# Patient Record
Sex: Male | Born: 1940 | Hispanic: No | Smoking: Former smoker
Health system: Southern US, Community
[De-identification: ages and names within clinical notes are randomized; demographics above are authoritative.]

## PROBLEM LIST (undated history)

## (undated) DIAGNOSIS — C61 Malignant neoplasm of prostate: Secondary | ICD-10-CM

## (undated) DIAGNOSIS — Z8673 Personal history of transient ischemic attack (TIA), and cerebral infarction without residual deficits: Secondary | ICD-10-CM

## (undated) DIAGNOSIS — H409 Unspecified glaucoma: Secondary | ICD-10-CM

## (undated) DIAGNOSIS — I10 Essential (primary) hypertension: Secondary | ICD-10-CM

## (undated) DIAGNOSIS — N183 Chronic kidney disease, stage 3 unspecified: Secondary | ICD-10-CM

## (undated) DIAGNOSIS — E119 Type 2 diabetes mellitus without complications: Secondary | ICD-10-CM

## (undated) DIAGNOSIS — I459 Conduction disorder, unspecified: Secondary | ICD-10-CM

## (undated) HISTORY — DX: Unspecified glaucoma: H40.9

## (undated) HISTORY — DX: Malignant neoplasm of prostate: C61

## (undated) HISTORY — DX: Type 2 diabetes mellitus without complications: E11.9

## (undated) HISTORY — PX: CATARACT EXTRACTION: SUR2

## (undated) HISTORY — PX: INSERTION PROSTATE RADIATION SEED: SUR718

## (undated) HISTORY — DX: Chronic kidney disease, stage 3 unspecified: N18.30

## (undated) HISTORY — DX: Personal history of transient ischemic attack (TIA), and cerebral infarction without residual deficits: Z86.73

---

## 1998-05-19 ENCOUNTER — Other Ambulatory Visit: Admission: RE | Admit: 1998-05-19 | Discharge: 1998-05-19 | Payer: Self-pay | Admitting: *Deleted

## 2000-09-16 ENCOUNTER — Ambulatory Visit (HOSPITAL_COMMUNITY): Admission: RE | Admit: 2000-09-16 | Discharge: 2000-09-16 | Payer: Self-pay | Admitting: Gastroenterology

## 2001-02-03 ENCOUNTER — Encounter: Admission: RE | Admit: 2001-02-03 | Discharge: 2001-05-04 | Payer: Self-pay | Admitting: Internal Medicine

## 2009-05-25 ENCOUNTER — Ambulatory Visit: Admission: RE | Admit: 2009-05-25 | Discharge: 2009-08-23 | Payer: Self-pay | Admitting: Radiation Oncology

## 2009-06-14 ENCOUNTER — Ambulatory Visit (HOSPITAL_COMMUNITY): Admission: RE | Admit: 2009-06-14 | Discharge: 2009-06-14 | Payer: Self-pay | Admitting: Urology

## 2009-07-07 ENCOUNTER — Ambulatory Visit (HOSPITAL_BASED_OUTPATIENT_CLINIC_OR_DEPARTMENT_OTHER): Admission: RE | Admit: 2009-07-07 | Discharge: 2009-07-07 | Payer: Self-pay | Admitting: Urology

## 2009-10-04 ENCOUNTER — Emergency Department (HOSPITAL_COMMUNITY): Admission: EM | Admit: 2009-10-04 | Discharge: 2009-10-04 | Payer: Self-pay | Admitting: Family Medicine

## 2010-02-17 ENCOUNTER — Emergency Department (HOSPITAL_COMMUNITY): Admission: EM | Admit: 2010-02-17 | Discharge: 2010-02-17 | Payer: Self-pay | Admitting: Family Medicine

## 2011-03-13 ENCOUNTER — Encounter: Payer: Medicare Other | Attending: Internal Medicine | Admitting: *Deleted

## 2011-03-13 DIAGNOSIS — E119 Type 2 diabetes mellitus without complications: Secondary | ICD-10-CM | POA: Insufficient documentation

## 2011-03-13 DIAGNOSIS — Z713 Dietary counseling and surveillance: Secondary | ICD-10-CM | POA: Insufficient documentation

## 2011-04-05 LAB — CBC
Hemoglobin: 14.4 g/dL (ref 13.0–17.0)
RBC: 4.78 MIL/uL (ref 4.22–5.81)
RDW: 14.5 % (ref 11.5–15.5)

## 2011-04-05 LAB — DIFFERENTIAL
Basophils Relative: 1 % (ref 0–1)
Lymphs Abs: 0.9 10*3/uL (ref 0.7–4.0)
Monocytes Absolute: 0.9 10*3/uL (ref 0.1–1.0)
Monocytes Relative: 10 % (ref 3–12)
Neutrophils Relative %: 78 % — ABNORMAL HIGH (ref 43–77)

## 2011-04-05 LAB — POCT I-STAT, CHEM 8
Creatinine, Ser: 1.3 mg/dL (ref 0.4–1.5)
Glucose, Bld: 266 mg/dL — ABNORMAL HIGH (ref 70–99)
HCT: 48 % (ref 39.0–52.0)
TCO2: 26 mmol/L (ref 0–100)

## 2011-04-05 LAB — POCT URINALYSIS DIP (DEVICE)
Ketones, ur: 40 mg/dL — AB
Protein, ur: 100 mg/dL — AB
Specific Gravity, Urine: 1.02 (ref 1.005–1.030)
Urobilinogen, UA: 1 mg/dL (ref 0.0–1.0)
pH: 5.5 (ref 5.0–8.0)

## 2011-04-08 LAB — CBC
Hemoglobin: 14.4 g/dL (ref 13.0–17.0)
MCHC: 33.9 g/dL (ref 30.0–36.0)

## 2011-04-08 LAB — COMPREHENSIVE METABOLIC PANEL
BUN: 14 mg/dL (ref 6–23)
CO2: 26 mEq/L (ref 19–32)
Calcium: 9.6 mg/dL (ref 8.4–10.5)
Chloride: 103 mEq/L (ref 96–112)
Glucose, Bld: 228 mg/dL — ABNORMAL HIGH (ref 70–99)
Potassium: 4.3 mEq/L (ref 3.5–5.1)
Total Protein: 6.4 g/dL (ref 6.0–8.3)

## 2011-04-08 LAB — APTT: aPTT: 28 seconds (ref 24–37)

## 2011-04-08 LAB — GLUCOSE, CAPILLARY: Glucose-Capillary: 147 mg/dL — ABNORMAL HIGH (ref 70–99)

## 2011-05-15 NOTE — Op Note (Signed)
NAME:  Andre Stout, Andre Stout NO.:  [id number]   MEDICAL RECORD NO.:  [id number]          PATIENT TYPE:  AMB   LOCATION:  [facility]                         FACILITY:  [facility]   PHYSICIAN:  Lindaann Slough, M.D.  DATE OF BIRTH:  [birth year]   DATE OF PROCEDURE:  07/07/2009  DATE OF DISCHARGE:                               OPERATIVE REPORT   PREOPERATIVE DIAGNOSIS:  Adenocarcinoma of prostate stage T1C.   POSTOPERATIVE DIAGNOSIS:  Adenocarcinoma of prostate stage T1C.   PROCEDURE:  I-125 seeds implantation, cystoscopy.   SURGEON:  Danae Chen, M.D. and Maryln Gottron, M.D.   ASSISTANT:  Dr. Peggye Pitt.   ANESTHESIA:  General.   INDICATION:  The patient is a 71 year old male with a diagnosis of  prostate cancer, Gleason score 4+3 and 3+3 and a PSA of 4.65.  Treatment  options were discussed with the patient and he opted to have seeds  implantation.  He is scheduled today for the procedure.   PROCEDURE IN DETAIL:  The patient was identified by his wrist band and  proper time-out was taken.   The patient was then placed in the dorsal lithotomy position.  Ultrasound planning was done by Dr. Dayton Scrape.  When the planning was  completed under ultrasound guidance and with the Nucletron a total of 78  seeds were implanted in the prostate through 27 needles.  Fluoroscopy  was done and there appears to be good seed distribution.  Then the Foley  catheter was removed.  A flexible cystoscope was inserted in the  bladder.  The urethra is normal.  There is moderate prostatic  hypertrophy.  The bladder mucosa is normal.  The ureteral orifices are  in normal position and shape with clear efflux.  There is no stone, seed  or tumor in the bladder.  The cystoscope was then removed.  A #16 Foley  catheter was then inserted in the bladder.  The patient tolerated the  procedure well and left the OR in satisfactory condition to post  anesthesia care unit.      Lindaann Slough, M.D.  Electronically Signed     MN/MEDQ  D:  07/07/2009  T:  07/07/2009  Job:  [id number]

## 2011-05-18 NOTE — Procedures (Signed)
[facility]  Patient:    Andre Stout, Andre Stout                       MRN: [id number] Proc. Date: 09/16/00 Adm. Date:  [id number] Attending:  Louie Bun                           Procedure Report  PROCEDURE:  Colonoscopy.  INDICATION FOR PROCEDURE:  History of adenomatous colon polyps on colonoscopy 3 years ago.  DESCRIPTION OF PROCEDURE:  The patient was placed in the left lateral decubitus position and placed on the pulse monitor with continuous low flow oxygen delivered by nasal cannula. He was sedated with 50 mg IV Demerol and 5 mg IV Versed. The Olympus video colonoscope was inserted into the rectum and advanced to the cecum, confirmed by transillumination of McBurneys point and visualization of the ileocecal valve and appendiceal orifice. The prep was excellent. The cecum, ascending, transverse, descending and sigmoid colon all appeared normal with no masses, polyps, diverticula or other mucosal abnormalities. The rectum likewise appeared normal and retroflexed view of the anus did reveal some small internal hemorrhoids. The colonoscope was then withdrawn and the patient returned to the recovery room in stable condition. The patient tolerated the procedure well and there were no immediate complications.  IMPRESSION:  Internal hemorrhoids otherwise normal colonoscopy.  PLAN:  Follow-up colonoscopy in 5 years. DD:  09/16/00 TD:  09/17/00 Job: [id number]

## 2011-06-18 ENCOUNTER — Inpatient Hospital Stay (INDEPENDENT_AMBULATORY_CARE_PROVIDER_SITE_OTHER)
Admission: RE | Admit: 2011-06-18 | Discharge: 2011-06-18 | Disposition: A | Discharge status: Home / Self Care | Payer: PRIVATE HEALTH INSURANCE | Source: Ambulatory Visit | Attending: Emergency Medicine | Admitting: Emergency Medicine

## 2011-06-18 ENCOUNTER — Ambulatory Visit (INDEPENDENT_AMBULATORY_CARE_PROVIDER_SITE_OTHER): Payer: PRIVATE HEALTH INSURANCE

## 2011-06-18 DIAGNOSIS — M79609 Pain in unspecified limb: Secondary | ICD-10-CM

## 2011-07-31 ENCOUNTER — Ambulatory Visit (INDEPENDENT_AMBULATORY_CARE_PROVIDER_SITE_OTHER): Payer: PRIVATE HEALTH INSURANCE | Admitting: Ophthalmology

## 2011-07-31 DIAGNOSIS — E11359 Type 2 diabetes mellitus with proliferative diabetic retinopathy without macular edema: Secondary | ICD-10-CM

## 2011-07-31 DIAGNOSIS — H2510 Age-related nuclear cataract, unspecified eye: Secondary | ICD-10-CM

## 2011-07-31 DIAGNOSIS — H43819 Vitreous degeneration, unspecified eye: Secondary | ICD-10-CM

## 2012-01-31 ENCOUNTER — Ambulatory Visit (INDEPENDENT_AMBULATORY_CARE_PROVIDER_SITE_OTHER): Payer: Medicare Other | Admitting: Ophthalmology

## 2012-01-31 DIAGNOSIS — E1139 Type 2 diabetes mellitus with other diabetic ophthalmic complication: Secondary | ICD-10-CM

## 2012-01-31 DIAGNOSIS — H3581 Retinal edema: Secondary | ICD-10-CM

## 2012-01-31 DIAGNOSIS — H25019 Cortical age-related cataract, unspecified eye: Secondary | ICD-10-CM

## 2012-01-31 DIAGNOSIS — H43819 Vitreous degeneration, unspecified eye: Secondary | ICD-10-CM

## 2012-01-31 DIAGNOSIS — E1165 Type 2 diabetes mellitus with hyperglycemia: Secondary | ICD-10-CM

## 2012-01-31 DIAGNOSIS — E11359 Type 2 diabetes mellitus with proliferative diabetic retinopathy without macular edema: Secondary | ICD-10-CM

## 2012-02-13 ENCOUNTER — Ambulatory Visit (INDEPENDENT_AMBULATORY_CARE_PROVIDER_SITE_OTHER): Payer: Medicare Other | Admitting: Ophthalmology

## 2012-02-13 DIAGNOSIS — H3581 Retinal edema: Secondary | ICD-10-CM

## 2012-06-16 ENCOUNTER — Ambulatory Visit (INDEPENDENT_AMBULATORY_CARE_PROVIDER_SITE_OTHER): Payer: Medicare Other | Admitting: Ophthalmology

## 2012-06-16 DIAGNOSIS — H3340 Traction detachment of retina, unspecified eye: Secondary | ICD-10-CM

## 2012-06-16 DIAGNOSIS — E11359 Type 2 diabetes mellitus with proliferative diabetic retinopathy without macular edema: Secondary | ICD-10-CM

## 2012-06-16 DIAGNOSIS — H43819 Vitreous degeneration, unspecified eye: Secondary | ICD-10-CM

## 2012-06-16 DIAGNOSIS — E1139 Type 2 diabetes mellitus with other diabetic ophthalmic complication: Secondary | ICD-10-CM

## 2012-06-16 DIAGNOSIS — H2510 Age-related nuclear cataract, unspecified eye: Secondary | ICD-10-CM

## 2012-10-20 ENCOUNTER — Ambulatory Visit (INDEPENDENT_AMBULATORY_CARE_PROVIDER_SITE_OTHER): Payer: Medicare Other | Admitting: Ophthalmology

## 2012-10-20 DIAGNOSIS — H43819 Vitreous degeneration, unspecified eye: Secondary | ICD-10-CM

## 2012-10-20 DIAGNOSIS — E1139 Type 2 diabetes mellitus with other diabetic ophthalmic complication: Secondary | ICD-10-CM

## 2012-10-20 DIAGNOSIS — E1165 Type 2 diabetes mellitus with hyperglycemia: Secondary | ICD-10-CM

## 2012-10-20 DIAGNOSIS — H3340 Traction detachment of retina, unspecified eye: Secondary | ICD-10-CM

## 2012-10-20 DIAGNOSIS — E11359 Type 2 diabetes mellitus with proliferative diabetic retinopathy without macular edema: Secondary | ICD-10-CM

## 2013-02-20 ENCOUNTER — Ambulatory Visit (INDEPENDENT_AMBULATORY_CARE_PROVIDER_SITE_OTHER): Payer: Medicare Other | Admitting: Ophthalmology

## 2013-02-20 DIAGNOSIS — H43819 Vitreous degeneration, unspecified eye: Secondary | ICD-10-CM

## 2013-02-20 DIAGNOSIS — H2510 Age-related nuclear cataract, unspecified eye: Secondary | ICD-10-CM

## 2013-02-20 DIAGNOSIS — E11359 Type 2 diabetes mellitus with proliferative diabetic retinopathy without macular edema: Secondary | ICD-10-CM

## 2013-02-20 DIAGNOSIS — H3340 Traction detachment of retina, unspecified eye: Secondary | ICD-10-CM

## 2013-02-20 DIAGNOSIS — E1039 Type 1 diabetes mellitus with other diabetic ophthalmic complication: Secondary | ICD-10-CM

## 2013-08-06 ENCOUNTER — Ambulatory Visit (INDEPENDENT_AMBULATORY_CARE_PROVIDER_SITE_OTHER): Payer: Medicare Other | Admitting: Ophthalmology

## 2013-08-06 DIAGNOSIS — H35039 Hypertensive retinopathy, unspecified eye: Secondary | ICD-10-CM

## 2013-08-06 DIAGNOSIS — H3340 Traction detachment of retina, unspecified eye: Secondary | ICD-10-CM

## 2013-08-06 DIAGNOSIS — H43819 Vitreous degeneration, unspecified eye: Secondary | ICD-10-CM

## 2013-08-06 DIAGNOSIS — E1165 Type 2 diabetes mellitus with hyperglycemia: Secondary | ICD-10-CM

## 2013-08-06 DIAGNOSIS — E1139 Type 2 diabetes mellitus with other diabetic ophthalmic complication: Secondary | ICD-10-CM

## 2013-08-06 DIAGNOSIS — E11359 Type 2 diabetes mellitus with proliferative diabetic retinopathy without macular edema: Secondary | ICD-10-CM

## 2013-08-06 DIAGNOSIS — I10 Essential (primary) hypertension: Secondary | ICD-10-CM

## 2013-08-21 ENCOUNTER — Ambulatory Visit (INDEPENDENT_AMBULATORY_CARE_PROVIDER_SITE_OTHER): Payer: Medicare Other | Admitting: Ophthalmology

## 2014-02-08 ENCOUNTER — Ambulatory Visit (INDEPENDENT_AMBULATORY_CARE_PROVIDER_SITE_OTHER): Payer: Medicare Other | Admitting: Ophthalmology

## 2014-02-08 DIAGNOSIS — H4310 Vitreous hemorrhage, unspecified eye: Secondary | ICD-10-CM

## 2014-02-08 DIAGNOSIS — E1139 Type 2 diabetes mellitus with other diabetic ophthalmic complication: Secondary | ICD-10-CM

## 2014-02-08 DIAGNOSIS — E11359 Type 2 diabetes mellitus with proliferative diabetic retinopathy without macular edema: Secondary | ICD-10-CM

## 2014-02-08 DIAGNOSIS — H35039 Hypertensive retinopathy, unspecified eye: Secondary | ICD-10-CM

## 2014-02-08 DIAGNOSIS — I10 Essential (primary) hypertension: Secondary | ICD-10-CM

## 2014-06-09 ENCOUNTER — Ambulatory Visit (INDEPENDENT_AMBULATORY_CARE_PROVIDER_SITE_OTHER): Payer: Medicare Other | Admitting: Ophthalmology

## 2014-06-09 DIAGNOSIS — E11359 Type 2 diabetes mellitus with proliferative diabetic retinopathy without macular edema: Secondary | ICD-10-CM

## 2014-06-09 DIAGNOSIS — H2510 Age-related nuclear cataract, unspecified eye: Secondary | ICD-10-CM

## 2014-06-09 DIAGNOSIS — H35039 Hypertensive retinopathy, unspecified eye: Secondary | ICD-10-CM

## 2014-06-09 DIAGNOSIS — E1165 Type 2 diabetes mellitus with hyperglycemia: Secondary | ICD-10-CM

## 2014-06-09 DIAGNOSIS — H43819 Vitreous degeneration, unspecified eye: Secondary | ICD-10-CM

## 2014-06-09 DIAGNOSIS — I10 Essential (primary) hypertension: Secondary | ICD-10-CM

## 2014-06-09 DIAGNOSIS — E1139 Type 2 diabetes mellitus with other diabetic ophthalmic complication: Secondary | ICD-10-CM

## 2014-10-29 ENCOUNTER — Ambulatory Visit (INDEPENDENT_AMBULATORY_CARE_PROVIDER_SITE_OTHER): Payer: Medicare Other | Admitting: Family

## 2014-10-29 ENCOUNTER — Encounter: Payer: Self-pay | Admitting: Family

## 2014-10-29 ENCOUNTER — Other Ambulatory Visit (INDEPENDENT_AMBULATORY_CARE_PROVIDER_SITE_OTHER): Payer: Medicare Other

## 2014-10-29 VITALS — BP 132/74 | HR 73 | Temp 98.5°F | Resp 18 | Ht 64.0 in | Wt 169.2 lb

## 2014-10-29 DIAGNOSIS — E119 Type 2 diabetes mellitus without complications: Secondary | ICD-10-CM

## 2014-10-29 LAB — BASIC METABOLIC PANEL
BUN: 11 mg/dL (ref 6–23)
CO2: 20 mEq/L (ref 19–32)
CREATININE: 1 mg/dL (ref 0.4–1.5)
Calcium: 9.8 mg/dL (ref 8.4–10.5)
Chloride: 105 mEq/L (ref 96–112)
GFR: 76.76 mL/min (ref >60.00)
GLUCOSE: 61 mg/dL — AB (ref 70–99)
Potassium: 4.5 mEq/L (ref 3.5–5.1)
SODIUM: 136 meq/L (ref 135–145)

## 2014-10-29 LAB — HEMOGLOBIN A1C: HEMOGLOBIN A1C: 9.2 % — AB (ref 4.6–6.5)

## 2014-10-29 NOTE — Progress Notes (Signed)
Pre visit review using our clinic review tool, if applicable. No additional management support is needed unless otherwise documented below in the visit note. 

## 2014-10-29 NOTE — Assessment & Plan Note (Signed)
Diabetes remains uncontrolled with an A1c >7. Obtain A1c for control status and BMET for kidney function. Continue 30 U lantus daily, glyburide/metformin, Januvia, and Pioglitizone. Follow up in 3 months or sooner pending lab results.   LAB RESULTS: A1c 9.2 and kidney function normal. Concern for blood sugar of 61 at time of test. Will titrate Lantus pending patient's morning readings.

## 2014-10-29 NOTE — Progress Notes (Signed)
   Subjective:    Patient ID: Andre Stout, male    DOB: [birth year], 74 y.o.   MRN: [id number]  No chief complaint on file.  HPI:  Andre Stout is a 74 y.o. male who presents today for to establish care.   1) Diabetes - currently maintained on 30 units of lantus, Glyburide/Metformin, Januvia, and Pioglitazone. Last diabetic eye exam was completed about 3-4 months ago. Last diabetic foot exam was about 4 months ago. Last A1c was around 8 per patient.   No Known Allergies  Meds as listed above.   Past Medical History  Diagnosis Date  . Diabetes mellitus without complication   . Prostate cancer     Been 3-4 years ago   History   Social History  . Marital Status: Widowed    Spouse Name: N/A    Number of Children: 1  . Years of Education: 12   Occupational History  . Retired    Social History Main Topics  . Smoking status: Former Smoker -- 0.50 packs/day for 4 years    Types: Cigarettes    Quit date: 10/29/1974  . Smokeless tobacco: Never Used  . Alcohol Use: No  . Drug Use: No  . Sexual Activity: Not Currently   Other Topics Concern  . Not on file   Social History Narrative   Born and raised in [place], [place]. Currently reside in a private residence by himself. Daughter lives in the area. No live. Fun: hunt and fish   Denies religious beliefs that would effect health care.      Review of Systems    See HPI   Objective:    BP 132/74  Pulse 73  Temp(Src) 98.5 F (36.9 C) (Oral)  Resp 18  Ht 5\' 4"  (1.626 m)  Wt 169 lb 3.2 oz (76.749 kg)  BMI 29.03 kg/m2  SpO2 97%  Nursing note and vital signs reviewed.  Physical Exam  Constitutional: He is oriented to person, place, and time. He appears well-developed and well-nourished.  Cardiovascular: Normal rate, regular rhythm and normal heart sounds.   Pulmonary/Chest: Effort normal and breath sounds normal.  Neurological: He is alert and oriented to person, place, and time.  Skin: Skin is warm and dry.    Psychiatric: He has a normal mood and affect. His behavior is normal. Judgment and thought content normal.       Assessment & Plan:

## 2014-10-29 NOTE — Patient Instructions (Signed)
Thank you for choosing [facility].  Summary/Instructions:   Please stop by the lab prior to leaving for your blood work.  We will have the results of your labs to you when we receive them.  It was a pleasure to meet you.  We are here for your medical needs

## 2014-11-01 NOTE — Addendum Note (Signed)
Addended by: Delice Bison E on: 11/01/2014 09:46 AM   Modules accepted: Medications

## 2014-11-02 ENCOUNTER — Telehealth: Payer: Self-pay | Admitting: Family

## 2014-11-02 NOTE — Telephone Encounter (Signed)
Please call patient to inform him that his A1c is 9.2. This is higher than the recommended goal and therefore we may possible need to make adjustments to his current regimen. With his permission I would like to keep the insulin levels the same and add a medication call Tradjenta which will be a pill he takes on a daily basis. With this pill we may be able to decrease one of the other medications if we achieve control.

## 2014-11-03 NOTE — Telephone Encounter (Signed)
Called pt for lab results. No answer left message for him to call back.

## 2014-11-04 NOTE — Telephone Encounter (Signed)
Sent lab results in the mail.

## 2014-11-05 ENCOUNTER — Ambulatory Visit: Payer: Medicare Other | Admitting: Internal Medicine

## 2014-12-09 ENCOUNTER — Ambulatory Visit (INDEPENDENT_AMBULATORY_CARE_PROVIDER_SITE_OTHER): Payer: Medicare Other | Admitting: Ophthalmology

## 2014-12-09 DIAGNOSIS — E11359 Type 2 diabetes mellitus with proliferative diabetic retinopathy without macular edema: Secondary | ICD-10-CM | POA: Diagnosis not present

## 2014-12-09 DIAGNOSIS — E11311 Type 2 diabetes mellitus with unspecified diabetic retinopathy with macular edema: Secondary | ICD-10-CM

## 2014-12-09 DIAGNOSIS — E11351 Type 2 diabetes mellitus with proliferative diabetic retinopathy with macular edema: Secondary | ICD-10-CM | POA: Diagnosis not present

## 2014-12-09 DIAGNOSIS — H35033 Hypertensive retinopathy, bilateral: Secondary | ICD-10-CM

## 2014-12-09 DIAGNOSIS — H43813 Vitreous degeneration, bilateral: Secondary | ICD-10-CM

## 2014-12-09 DIAGNOSIS — I10 Essential (primary) hypertension: Secondary | ICD-10-CM | POA: Diagnosis not present

## 2014-12-16 ENCOUNTER — Encounter (INDEPENDENT_AMBULATORY_CARE_PROVIDER_SITE_OTHER): Payer: Medicare Other | Admitting: Ophthalmology

## 2014-12-16 DIAGNOSIS — E11311 Type 2 diabetes mellitus with unspecified diabetic retinopathy with macular edema: Secondary | ICD-10-CM

## 2014-12-16 DIAGNOSIS — E11351 Type 2 diabetes mellitus with proliferative diabetic retinopathy with macular edema: Secondary | ICD-10-CM

## 2015-01-13 ENCOUNTER — Encounter (INDEPENDENT_AMBULATORY_CARE_PROVIDER_SITE_OTHER): Payer: Medicare Other | Admitting: Ophthalmology

## 2015-01-13 DIAGNOSIS — I10 Essential (primary) hypertension: Secondary | ICD-10-CM

## 2015-01-13 DIAGNOSIS — E11359 Type 2 diabetes mellitus with proliferative diabetic retinopathy without macular edema: Secondary | ICD-10-CM

## 2015-01-13 DIAGNOSIS — E11311 Type 2 diabetes mellitus with unspecified diabetic retinopathy with macular edema: Secondary | ICD-10-CM | POA: Diagnosis not present

## 2015-01-13 DIAGNOSIS — H35033 Hypertensive retinopathy, bilateral: Secondary | ICD-10-CM

## 2015-01-13 DIAGNOSIS — E11351 Type 2 diabetes mellitus with proliferative diabetic retinopathy with macular edema: Secondary | ICD-10-CM

## 2015-01-13 DIAGNOSIS — H43813 Vitreous degeneration, bilateral: Secondary | ICD-10-CM

## 2015-01-31 ENCOUNTER — Telehealth: Payer: Self-pay | Admitting: Family

## 2015-01-31 ENCOUNTER — Other Ambulatory Visit (INDEPENDENT_AMBULATORY_CARE_PROVIDER_SITE_OTHER): Payer: Medicare Other

## 2015-01-31 ENCOUNTER — Encounter: Payer: Self-pay | Admitting: Family

## 2015-01-31 ENCOUNTER — Ambulatory Visit (INDEPENDENT_AMBULATORY_CARE_PROVIDER_SITE_OTHER): Payer: Medicare Other | Admitting: Family

## 2015-01-31 VITALS — BP 142/76 | HR 64 | Temp 97.6°F | Resp 18 | Ht 64.0 in | Wt 161.1 lb

## 2015-01-31 DIAGNOSIS — E119 Type 2 diabetes mellitus without complications: Secondary | ICD-10-CM

## 2015-01-31 DIAGNOSIS — IMO0002 Reserved for concepts with insufficient information to code with codable children: Secondary | ICD-10-CM

## 2015-01-31 DIAGNOSIS — E1165 Type 2 diabetes mellitus with hyperglycemia: Secondary | ICD-10-CM

## 2015-01-31 LAB — HEMOGLOBIN A1C: HEMOGLOBIN A1C: 11.3 % — AB (ref 4.6–6.5)

## 2015-01-31 MED ORDER — GLYBURIDE-METFORMIN 5-500 MG PO TABS: 1.0000 | ORAL_TABLET | Freq: Two times a day (BID) | ORAL | Status: DC

## 2015-01-31 MED ORDER — PIOGLITAZONE HCL 30 MG PO TABS: 30.0000 mg | ORAL_TABLET | Freq: Every day | ORAL | Status: DC

## 2015-01-31 MED ORDER — INSULIN GLARGINE 100 UNIT/ML ~~LOC~~ SOLN: 35.0000 [IU] | Freq: Every morning | SUBCUTANEOUS | Status: DC

## 2015-01-31 MED ORDER — ENALAPRIL MALEATE 5 MG PO TABS: 5.0000 mg | ORAL_TABLET | Freq: Two times a day (BID) | ORAL | Status: DC

## 2015-01-31 NOTE — Progress Notes (Signed)
   Subjective:    Patient ID: Andre Stout, male    DOB: 01/18/1940, 76 y.o.   MRN: [id number]  Chief Complaint  Patient presents with  . Follow-up    sometimes his sugar runs high around 200s but mainly stays in 130s and 140s    HPI:  Andre Stout is a 75 y.o. male who presents today for follow up of his diabetes.  Currently treated with glyburide-metformin, Lantus, and Actos. He is scheduled for an eye exam on February 11. Indicates that his blood sugars have ranged in the mid to upper 100s. Patient states that he is taking only 30 units of Lantus in the morning and is not taking the 10 units at night. Also notes that he is taking the Glucovance 2 tablets in the morning and 2 in the evening. Is due for foot exam.  Lab Results  Component Value Date   HGBA1C 9.2* 10/29/2014    No Known Allergies   Current Outpatient Prescriptions on File Prior to Visit  Medication Sig Dispense Refill  . Cholecalciferol (VITAMIN D3) 2000 UNITS TABS Take by mouth daily.    Marland Kitchen Cod Liver Oil 1000 MG CAPS Take by mouth daily.    . enalapril (VASOTEC) 5 MG tablet Take 5 mg by mouth 2 (two) times daily.    Marland Kitchen glyBURIDE-metformin (GLUCOVANCE) 5-500 MG per tablet Take 1 tablet by mouth 2 (two) times daily.    . insulin glargine (LANTUS) 100 UNIT/ML injection Inject 40 Units into the skin at bedtime. 30 units in the morning and 10 units in the evening    . Misc Natural Products (URINOZINC PO) Take by mouth daily.    . Multiple Vitamins-Minerals (CENTRUM ADULTS PO) Take by mouth daily.    . Omega 3 1200 MG CAPS Take by mouth daily.    . pioglitazone (ACTOS) 30 MG tablet Take 30 mg by mouth daily.     No current facility-administered medications on file prior to visit.    Review of Systems  Constitutional: Negative for unexpected weight change.  Eyes:       Denies changes in vision  Respiratory: Positive for chest tightness. Negative for shortness of breath.   Cardiovascular: Negative for chest pain  and leg swelling.  Endocrine: Negative for polydipsia, polyphagia and polyuria.  Neurological: Negative for numbness.      Objective:    BP 142/76 mmHg  Pulse 64  Temp(Src) 97.6 F (36.4 C) (Oral)  Resp 18  Ht 5\' 4"  (1.626 m)  Wt 161 lb 1.9 oz (73.084 kg)  BMI 27.64 kg/m2  SpO2 97% Nursing note and vital signs reviewed.  Physical Exam  Constitutional: He is oriented to person, place, and time. He appears well-developed and well-nourished. No distress.  Cardiovascular: Normal rate, regular rhythm, normal heart sounds and intact distal pulses.   Pulmonary/Chest: Effort normal and breath sounds normal.  Neurological: He is alert and oriented to person, place, and time.  Bilateral foot exam: Feet are free from skin breakdown and abrasions. Pulses are intact and appropriate. Sensation is intact and appropriate to monofilament.  Skin: Skin is warm and dry.  Psychiatric: He has a normal mood and affect. His behavior is normal. Judgment and thought content normal.       Assessment & Plan:

## 2015-01-31 NOTE — Assessment & Plan Note (Addendum)
Remains uncontrolled with a A1c of 9.2. Appear to be confusion regarding his medication regimen. Discussed his medications in detail. 6 increase Lantus to 35 units daily. Decrease Glucovance to one tablet twice a day. Continue taking Actos as previously prescribed at current dosage. Diabetic foot exam is normal. Patient unable to provide urine sample today for urine microalbumin. Obtain A1c. Follow up in 3 months.

## 2015-01-31 NOTE — Patient Instructions (Addendum)
Please take your Lantus at 35 Units in the morning.  Take the Glucovance 1 tablet in the morning and 1 tablet in the evening.  Continue to take your Actos as you always have.   Thank you for choosing [facility].  Summary/Instructions:  Your prescription(s) have been submitted to your pharmacy or been printed and provided for you. Please take as directed and contact our office if you believe you are having problem(s) with the medication(s) or have any questions.  Please stop by the lab on the basement level of the building for your blood work. Your results will be released to [facility] (or called to you) after review, usually within 72 hours after test completion. If any changes need to be made, you will be notified at that same time.  If your symptoms worsen or fail to improve, please contact our office for further instruction, or in case of emergency go directly to the emergency room at the closest medical facility.

## 2015-01-31 NOTE — Telephone Encounter (Signed)
Please inform the patient that his A1c has increased from 9.2 to 11.3. Therefore please increase the Lantus to 40 Units in the morning instead of the 35 we discussed and continue taking the other medications as prescribed. I am also sending a referral for endocrinology to assist in his care.

## 2015-01-31 NOTE — Progress Notes (Signed)
Pre visit review using our clinic review tool, if applicable. No additional management support is needed unless otherwise documented below in the visit note. 

## 2015-02-02 NOTE — Telephone Encounter (Signed)
Pt aware of results. Will do what is told in the note below.

## 2015-02-10 ENCOUNTER — Encounter (INDEPENDENT_AMBULATORY_CARE_PROVIDER_SITE_OTHER): Payer: Medicare Other | Admitting: Ophthalmology

## 2015-02-10 DIAGNOSIS — E11351 Type 2 diabetes mellitus with proliferative diabetic retinopathy with macular edema: Secondary | ICD-10-CM

## 2015-02-10 DIAGNOSIS — E11311 Type 2 diabetes mellitus with unspecified diabetic retinopathy with macular edema: Secondary | ICD-10-CM | POA: Diagnosis not present

## 2015-02-10 DIAGNOSIS — E11359 Type 2 diabetes mellitus with proliferative diabetic retinopathy without macular edema: Secondary | ICD-10-CM | POA: Diagnosis not present

## 2015-02-10 DIAGNOSIS — I10 Essential (primary) hypertension: Secondary | ICD-10-CM | POA: Diagnosis not present

## 2015-02-10 DIAGNOSIS — H43813 Vitreous degeneration, bilateral: Secondary | ICD-10-CM | POA: Diagnosis not present

## 2015-02-10 DIAGNOSIS — H35033 Hypertensive retinopathy, bilateral: Secondary | ICD-10-CM | POA: Diagnosis not present

## 2015-02-22 ENCOUNTER — Ambulatory Visit (INDEPENDENT_AMBULATORY_CARE_PROVIDER_SITE_OTHER): Payer: Medicare Other | Admitting: Endocrinology

## 2015-02-22 ENCOUNTER — Encounter: Payer: Self-pay | Admitting: Endocrinology

## 2015-02-22 VITALS — BP 138/78 | HR 68 | Temp 98.4°F | Ht 64.0 in | Wt 163.0 lb

## 2015-02-22 DIAGNOSIS — E119 Type 2 diabetes mellitus without complications: Secondary | ICD-10-CM

## 2015-02-22 MED ORDER — INSULIN GLARGINE 100 UNIT/ML ~~LOC~~ SOLN: 50.0000 [IU] | Freq: Every morning | SUBCUTANEOUS | Status: DC

## 2015-02-22 NOTE — Patient Instructions (Addendum)
good diet and exercise habits significanly improve the control of your diabetes.  please let me know if you wish to be referred to a dietician.  high blood sugar is very risky to your health.  you should see an eye doctor and dentist every year.  It is very important to get all recommended vaccinations.  controlling your blood pressure and cholesterol drastically reduces the damage diabetes does to your body.  Those who smoke should quit.  please discuss these with your doctor.  check your blood sugar twice a day.  vary the time of day when you check, between before the 3 meals, and at bedtime.  also check if you have symptoms of your blood sugar being too high or too low.  please keep a record of the readings and bring it to your next appointment here.  You can write it on any piece of paper.  please call us sooner if your blood sugar goes below 70, or if you have a lot of readings over 200.   For now, please stop taking the glyburide-metformin and pioglitizone, and: Increase the lantus to 50 units each morning. Please come back for a follow-up appointment in 2 weeks.

## 2015-02-22 NOTE — Progress Notes (Signed)
Subjective:    Patient ID: Andre Stout, male    DOB: May 20, 1940, 75 y.o.   MRN: [id number]  HPI pt states DM was dx'ed in 1990; he has mild if any neuropathy of the lower extremities; but he has associated retinopathy; he has been on insulin since 2006; pt says his diet and exercise are "fair;" he has never had pancreatitis, severe hypoglycemia or DKA.  He says cbg's in am are in the mid-100's. Pt says he never misses the insulin.   Past Medical History  Diagnosis Date  . Diabetes mellitus without complication   . Prostate cancer     Been 3-4 years ago    Past Surgical History  Procedure Laterality Date  . Cataract extraction Bilateral     History   Social History  . Marital Status: Widowed    Spouse Name: N/A  . Number of Children: 1  . Years of Education: 12   Occupational History  . Retired    Social History Main Topics  . Smoking status: Former Smoker -- 0.50 packs/day for 4 years    Types: Cigarettes    Quit date: 10/29/1974  . Smokeless tobacco: Never Used  . Alcohol Use: No  . Drug Use: No  . Sexual Activity: Not Currently   Other Topics Concern  . Not on file   Social History Narrative   Born and raised in [place], [place]. Currently reside in a private residence by himself. Daughter lives in the area. No live. Fun: hunt and fish   Denies religious beliefs that would effect health care.     Current Outpatient Prescriptions on File Prior to Visit  Medication Sig Dispense Refill  . Cholecalciferol (VITAMIN D3) 2000 UNITS TABS Take by mouth daily.    Marland Kitchen Cod Liver Oil 1000 MG CAPS Take by mouth daily.    . enalapril (VASOTEC) 5 MG tablet Take 1 tablet (5 mg total) by mouth 2 (two) times daily. 180 tablet 3  . Misc Natural Products (URINOZINC PO) Take by mouth daily.    . Multiple Vitamins-Minerals (CENTRUM ADULTS PO) Take by mouth daily.    . Omega 3 1200 MG CAPS Take by mouth daily.     No current facility-administered medications on file prior to visit.      No Known Allergies  Family History  Problem Relation Age of Onset  . Diabetes Father   . Diabetes Paternal Grandfather     BP 138/78 mmHg  Pulse 68  Temp(Src) 98.4 F (36.9 C) (Oral)  Ht 5\' 4"  (1.626 m)  Wt 163 lb (73.936 kg)  BMI 27.97 kg/m2  SpO2 98%  Review of Systems denies blurry vision, headache, chest pain, sob, n/v, hypoglycemia, urinary frequency, muscle cramps, excessive diaphoresis, depression, cold intolerance, rhinorrhea, and easy bruising.  He has lost a few lbs.      Objective:   Physical Exam VITAL SIGNS:  See vs page GENERAL: no distress Pulses: dorsalis pedis intact bilat.   MSK: no deformity of the feet CV: 1+ bilat leg edema Skin:  no ulcer on the feet.  normal color and temp on the feet. Neuro: sensation is intact to touch on the feet.    Lab Results  Component Value Date   HGBA1C 11.3* 01/31/2015       Assessment & Plan:  DM: new to me: severe exacerbation   Patient is advised the following: Patient Instructions  good diet and exercise habits significanly improve the control of your diabetes.  please  let me know if you wish to be referred to a dietician.  high blood sugar is very risky to your health.  you should see an eye doctor and dentist every year.  It is very important to get all recommended vaccinations.  controlling your blood pressure and cholesterol drastically reduces the damage diabetes does to your body.  Those who smoke should quit.  please discuss these with your doctor.  check your blood sugar twice a day.  vary the time of day when you check, between before the 3 meals, and at bedtime.  also check if you have symptoms of your blood sugar being too high or too low.  please keep a record of the readings and bring it to your next appointment here.  You can write it on any piece of paper.  please call us sooner if your blood sugar goes below 70, or if you have a lot of readings over 200.   For now, please stop taking the  glyburide-metformin and pioglitizone, and: Increase the lantus to 50 units each morning. Please come back for a follow-up appointment in 2 weeks.

## 2015-03-08 ENCOUNTER — Ambulatory Visit (INDEPENDENT_AMBULATORY_CARE_PROVIDER_SITE_OTHER): Payer: Medicare Other | Admitting: Endocrinology

## 2015-03-08 ENCOUNTER — Encounter: Payer: Self-pay | Admitting: Endocrinology

## 2015-03-08 VITALS — BP 132/66 | HR 63 | Temp 98.6°F | Wt 162.2 lb

## 2015-03-08 DIAGNOSIS — E119 Type 2 diabetes mellitus without complications: Secondary | ICD-10-CM

## 2015-03-08 NOTE — Progress Notes (Signed)
Subjective:    Patient ID: Andre Stout, male    DOB: 1940/07/20, 75 y.o.   MRN: [id number]  HPI  Pt returns for f/u of diabetes mellitus: DM type: Insulin-requiring type 2 Dx'ed: 8299 Complications: retinopathy Therapy: insulin since 2006 DKA: never Severe hypoglycemia: never Pancreatitis: never Other: he has chosen a qd insulin regimen.   Interval history: no cbg record, but states cbg's vary from 80-200.  It is in general higher as the day goes on.  pt states he feels well in general. Past Medical History  Diagnosis Date  . Diabetes mellitus without complication   . Prostate cancer     Been 3-4 years ago    Past Surgical History  Procedure Laterality Date  . Cataract extraction Bilateral     History   Social History  . Marital Status: Widowed    Spouse Name: N/A  . Number of Children: 1  . Years of Education: 12   Occupational History  . Retired    Social History Main Topics  . Smoking status: Former Smoker -- 0.50 packs/day for 4 years    Types: Cigarettes    Quit date: 10/29/1974  . Smokeless tobacco: Never Used  . Alcohol Use: No  . Drug Use: No  . Sexual Activity: Not Currently   Other Topics Concern  . Not on file   Social History Narrative   Born and raised in [place], [place]. Currently reside in a private residence by himself. Daughter lives in the area. No live. Fun: hunt and fish   Denies religious beliefs that would effect health care.     Current Outpatient Prescriptions on File Prior to Visit  Medication Sig Dispense Refill  . Cholecalciferol (VITAMIN D3) 2000 UNITS TABS Take by mouth daily.    Marland Kitchen Cod Liver Oil 1000 MG CAPS Take by mouth daily.    . enalapril (VASOTEC) 5 MG tablet Take 1 tablet (5 mg total) by mouth 2 (two) times daily. 180 tablet 3  . insulin glargine (LANTUS) 100 UNIT/ML injection Inject 0.5 mLs (50 Units total) into the skin every morning. 20 mL 11  . Misc Natural Products (URINOZINC PO) Take by mouth daily.    .  Multiple Vitamins-Minerals (CENTRUM ADULTS PO) Take by mouth daily.    . Omega 3 1200 MG CAPS Take by mouth daily.     No current facility-administered medications on file prior to visit.    No Known Allergies  Family History  Problem Relation Age of Onset  . Diabetes Father   . Diabetes Paternal Grandfather     BP 132/66 mmHg  Pulse 63  Temp(Src) 98.6 F (37 C) (Oral)  Wt 162 lb 3.2 oz (73.573 kg)    Review of Systems He denies hypoglycemia.      Objective:   Physical Exam VITAL SIGNS:  See vs page.   GENERAL: no distress. SKIN:  Insulin injection sites at the anterior abdomen are normal, except for a few ecchymoses      Assessment & Plan:  DM: The pattern of his cbg's indicates he needs a faster-acting insulin.  Pt declines to change to levemir for now, but will reconsider at next ov.   Patient is advised the following: Patient Instructions  check your blood sugar twice a day.  vary the time of day when you check, between before the 3 meals, and at bedtime.  also check if you have symptoms of your blood sugar being too high or too low.  please  keep a record of the readings and bring it to your next appointment here.  You can write it on any piece of paper.  please call us sooner if your blood sugar goes below 70, or if you have a lot of readings over 200.   Increase the lantus to 50 units each morning. Please come back for a follow-up appointment in 6-8 weeks.

## 2015-03-08 NOTE — Patient Instructions (Addendum)
check your blood sugar twice a day.  vary the time of day when you check, between before the 3 meals, and at bedtime.  also check if you have symptoms of your blood sugar being too high or too low.  please keep a record of the readings and bring it to your next appointment here.  You can write it on any piece of paper.  please call us sooner if your blood sugar goes below 70, or if you have a lot of readings over 200.   Increase the lantus to 50 units each morning. Please come back for a follow-up appointment in 6-8 weeks.

## 2015-03-11 ENCOUNTER — Encounter (INDEPENDENT_AMBULATORY_CARE_PROVIDER_SITE_OTHER): Payer: Medicare Other | Admitting: Ophthalmology

## 2015-03-11 DIAGNOSIS — E11359 Type 2 diabetes mellitus with proliferative diabetic retinopathy without macular edema: Secondary | ICD-10-CM

## 2015-03-11 DIAGNOSIS — H43813 Vitreous degeneration, bilateral: Secondary | ICD-10-CM

## 2015-03-11 DIAGNOSIS — H35371 Puckering of macula, right eye: Secondary | ICD-10-CM | POA: Diagnosis not present

## 2015-03-11 DIAGNOSIS — H35033 Hypertensive retinopathy, bilateral: Secondary | ICD-10-CM | POA: Diagnosis not present

## 2015-03-11 DIAGNOSIS — E11331 Type 2 diabetes mellitus with moderate nonproliferative diabetic retinopathy with macular edema: Secondary | ICD-10-CM | POA: Diagnosis not present

## 2015-03-11 DIAGNOSIS — E11351 Type 2 diabetes mellitus with proliferative diabetic retinopathy with macular edema: Secondary | ICD-10-CM

## 2015-03-11 DIAGNOSIS — I10 Essential (primary) hypertension: Secondary | ICD-10-CM

## 2015-04-08 ENCOUNTER — Encounter (INDEPENDENT_AMBULATORY_CARE_PROVIDER_SITE_OTHER): Payer: Medicare Other | Admitting: Ophthalmology

## 2015-04-08 DIAGNOSIS — E11311 Type 2 diabetes mellitus with unspecified diabetic retinopathy with macular edema: Secondary | ICD-10-CM

## 2015-04-08 DIAGNOSIS — H35033 Hypertensive retinopathy, bilateral: Secondary | ICD-10-CM

## 2015-04-08 DIAGNOSIS — E11351 Type 2 diabetes mellitus with proliferative diabetic retinopathy with macular edema: Secondary | ICD-10-CM | POA: Diagnosis not present

## 2015-04-08 DIAGNOSIS — H43813 Vitreous degeneration, bilateral: Secondary | ICD-10-CM

## 2015-04-08 DIAGNOSIS — I10 Essential (primary) hypertension: Secondary | ICD-10-CM

## 2015-04-08 DIAGNOSIS — E11359 Type 2 diabetes mellitus with proliferative diabetic retinopathy without macular edema: Secondary | ICD-10-CM

## 2015-05-02 ENCOUNTER — Ambulatory Visit (INDEPENDENT_AMBULATORY_CARE_PROVIDER_SITE_OTHER): Payer: Medicare Other | Admitting: Family

## 2015-05-02 ENCOUNTER — Encounter: Payer: Self-pay | Admitting: Family

## 2015-05-02 VITALS — BP 132/72 | HR 57 | Temp 98.0°F | Resp 18 | Ht 64.0 in | Wt 169.0 lb

## 2015-05-02 DIAGNOSIS — Z0000 Encounter for general adult medical examination without abnormal findings: Secondary | ICD-10-CM | POA: Insufficient documentation

## 2015-05-02 DIAGNOSIS — Z23 Encounter for immunization: Secondary | ICD-10-CM

## 2015-05-02 NOTE — Assessment & Plan Note (Signed)
Reviewed and updated patient's medical, surgical, family and social history. Medications and allergies were also reviewed. Basic screenings for depression, activities of daily living, hearing, cognition and safety were performed. Provider list was updated and health plan was provided to the patient.  

## 2015-05-02 NOTE — Patient Instructions (Addendum)
Thank you for choosing [facility].  Summary/Instructions:  Please stop by the lab on the basement level of the building for your blood work. Your results will be released to [place] (or called to you) after review, usually within 72 hours after test completion. If any changes need to be made, you will be notified at that same time.  Pneumococcal Vaccine, Polyvalent suspension for injection What is this medicine? PNEUMOCOCCAL VACCINE, POLYVALENT (NEU mo KOK al vak SEEN, pol ee VEY luhnt) is a vaccine to prevent pneumococcus bacteria infection. These bacteria are a major cause of ear infections, 'Strep throat' infections, and serious pneumonia, meningitis, or blood infections worldwide. These vaccines help the body to produce antibodies (protective substances) that help your body defend against these bacteria. This vaccine is recommended for infants and young children. This vaccine will not treat an infection. This medicine may be used for other purposes; ask your health care provider or pharmacist if you have questions. COMMON BRAND NAME(S): Prevnar 13 What should I tell my health care provider before I take this medicine? They need to know if you have any of these conditions: -bleeding problems -fever -immune system problems -low platelet count in the blood -seizures -an unusual or allergic reaction to pneumococcal vaccine, diphtheria toxoid, other vaccines, latex, other medicines, foods, dyes, or preservatives -pregnant or trying to get pregnant -breast-feeding How should I use this medicine? This vaccine is for injection into a muscle. It is given by a health care professional. A copy of Vaccine Information Statements will be given before each vaccination. Read this sheet carefully each time. The sheet may change frequently. Talk to your pediatrician regarding the use of this medicine in children. While this drug may be prescribed for children as young as 7 weeks old for selected  conditions, precautions do apply. Overdosage: If you think you have taken too much of this medicine contact a poison control center or emergency room at once. NOTE: This medicine is only for you. Do not share this medicine with others. What if I miss a dose? It is important not to miss your dose. Call your doctor or health care professional if you are unable to keep an appointment. What may interact with this medicine? -medicines for cancer chemotherapy -medicines that suppress your immune function -medicines that treat or prevent blood clots like warfarin, enoxaparin, and dalteparin -steroid medicines like prednisone or cortisone This list may not describe all possible interactions. Give your health care provider a list of all the medicines, herbs, non-prescription drugs, or dietary supplements you use. Also tell them if you smoke, drink alcohol, or use illegal drugs. Some items may interact with your medicine. What should I watch for while using this medicine? Mild fever and pain should go away in 3 days or less. Report any unusual symptoms to your doctor or health care professional. What side effects may I notice from receiving this medicine? Side effects that you should report to your doctor or health care professional as soon as possible: -allergic reactions like skin rash, itching or hives, swelling of the face, lips, or tongue -breathing problems -confused -fever over 102 degrees F -pain, tingling, numbness in the hands or feet -seizures -unusual bleeding or bruising -unusual muscle weakness Side effects that usually do not require medical attention (report to your doctor or health care professional if they continue or are bothersome): -aches and pains -diarrhea -fever of 102 degrees F or less -headache -irritable -loss of appetite -pain, tender at site where injected -trouble  sleeping This list may not describe all possible side effects. Call your doctor for medical advice  about side effects. You may report side effects to FDA at 1-800-FDA-1088. Where should I keep my medicine? This does not apply. This vaccine is given in a clinic, pharmacy, doctor's office, or other health care setting and will not be stored at home. NOTE: This sheet is a summary. It may not cover all possible information. If you have questions about this medicine, talk to your doctor, pharmacist, or health care provider.  2015, [employer]/Gold Standard. (2009-03-01 10:17:22) Diabetes and Exercise Exercising regularly is important. It is not just about losing weight. It has many health benefits, such as:  Improving your overall fitness, flexibility, and endurance.  Increasing your bone density.  Helping with weight control.  Decreasing your body fat.  Increasing your muscle strength.  Reducing stress and tension.  Improving your overall health. People with diabetes who exercise gain additional benefits because exercise:  Reduces appetite.  Improves the body's use of blood sugar (glucose).  Helps lower or control blood glucose.  Decreases blood pressure.  Helps control blood lipids (such as cholesterol and triglycerides).  Improves the body's use of the hormone insulin by:  Increasing the body's insulin sensitivity.  Reducing the body's insulin needs.  Decreases the risk for heart disease because exercising:  Lowers cholesterol and triglycerides levels.  Increases the levels of good cholesterol (such as high-density lipoproteins [HDL]) in the body.  Lowers blood glucose levels. YOUR ACTIVITY PLAN  Choose an activity that you enjoy and set realistic goals. Your health care provider or diabetes educator can help you make an activity plan that works for you. Exercise regularly as directed by your health care provider. This includes:  Performing resistance training twice a week such as push-ups, sit-ups, lifting weights, or using resistance bands.  Performing 150 minutes  of cardio exercises each week such as walking, running, or playing sports.  Staying active and spending no more than 90 minutes at one time being inactive. Even short bursts of exercise are good for you. Three 10-minute sessions spread throughout the day are just as beneficial as a single 30-minute session. Some exercise ideas include:  Taking the dog for a walk.  Taking the stairs instead of the elevator.  Dancing to your favorite song.  Doing an exercise video.  Doing your favorite exercise with a friend. RECOMMENDATIONS FOR EXERCISING WITH TYPE 1 OR TYPE 2 DIABETES   Check your blood glucose before exercising. If blood glucose levels are greater than 240 mg/dL, check for urine ketones. Do not exercise if ketones are present.  Avoid injecting insulin into areas of the body that are going to be exercised. For example, avoid injecting insulin into:  The arms when playing tennis.  The legs when jogging.  Keep a record of:  Food intake before and after you exercise.  Expected peak times of insulin action.  Blood glucose levels before and after you exercise.  The type and amount of exercise you have done.  Review your records with your health care provider. Your health care provider will help you to develop guidelines for adjusting food intake and insulin amounts before and after exercising.  If you take insulin or oral hypoglycemic agents, watch for signs and symptoms of hypoglycemia. They include:  Dizziness.  Shaking.  Sweating.  Chills.  Confusion.  Drink plenty of water while you exercise to prevent dehydration or heat stroke. Body water is lost during exercise and must be  replaced.  Talk to your health care provider before starting an exercise program to make sure it is safe for you. Remember, almost any type of activity is better than none. Document Released: 03/08/2004 Document Revised: 05/03/2014 Document Reviewed: 05/26/2013 [facility] Patient Information  2015 [place], [place]. This information is not intended to replace advice given to you by your health care provider. Make sure you discuss any questions you have with your health care provider. Health Maintenance A healthy lifestyle and preventative care can promote health and wellness.  Maintain regular health, dental, and eye exams.  Eat a healthy diet. Foods like vegetables, fruits, whole grains, low-fat dairy products, and lean protein foods contain the nutrients you need and are low in calories. Decrease your intake of foods high in solid fats, added sugars, and salt. Get information about a proper diet from your health care provider, if necessary.  Regular physical exercise is one of the most important things you can do for your health. Most adults should get at least 150 minutes of moderate-intensity exercise (any activity that increases your heart rate and causes you to sweat) each week. In addition, most adults need muscle-strengthening exercises on 2 or more days a week.   Maintain a healthy weight. The body mass index (BMI) is a screening tool to identify possible weight problems. It provides an estimate of body fat based on height and weight. Your health care provider can find your BMI and can help you achieve or maintain a healthy weight. For males 20 years and older:  A BMI below 18.5 is considered underweight.  A BMI of 18.5 to 24.9 is normal.  A BMI of 25 to 29.9 is considered overweight.  A BMI of 30 and above is considered obese.  Maintain normal blood lipids and cholesterol by exercising and minimizing your intake of saturated fat. Eat a balanced diet with plenty of fruits and vegetables. Blood tests for lipids and cholesterol should begin at age 71 and be repeated every 5 years. If your lipid or cholesterol levels are high, you are over age 23, or you are at high risk for heart disease, you may need your cholesterol levels checked more frequently.Ongoing high lipid and  cholesterol levels should be treated with medicines if diet and exercise are not working.  If you smoke, find out from your health care provider how to quit. If you do not use tobacco, do not start.  Lung cancer screening is recommended for adults aged 46-80 years who are at high risk for developing lung cancer because of a history of smoking. A yearly low-dose CT scan of the lungs is recommended for people who have at least a 30-pack-year history of smoking and are current smokers or have quit within the past 15 years. A pack year of smoking is smoking an average of 1 pack of cigarettes a day for 1 year (for example, a 30-pack-year history of smoking could mean smoking 1 pack a day for 30 years or 2 packs a day for 15 years). Yearly screening should continue until the smoker has stopped smoking for at least 15 years. Yearly screening should be stopped for people who develop a health problem that would prevent them from having lung cancer treatment.  If you choose to drink alcohol, do not have more than 2 drinks per day. One drink is considered to be 12 oz (360 mL) of beer, 5 oz (150 mL) of wine, or 1.5 oz (45 mL) of liquor.  Avoid the use of  street drugs. Do not share needles with anyone. Ask for help if you need support or instructions about stopping the use of drugs.  High blood pressure causes heart disease and increases the risk of stroke. Blood pressure should be checked at least every 1-2 years. Ongoing high blood pressure should be treated with medicines if weight loss and exercise are not effective.  If you are 15-65 years old, ask your health care provider if you should take aspirin to prevent heart disease.  Diabetes screening involves taking a blood sample to check your fasting blood sugar level. This should be done once every 3 years after age 65 if you are at a normal weight and without risk factors for diabetes. Testing should be considered at a younger age or be carried out more  frequently if you are overweight and have at least 1 risk factor for diabetes.  Colorectal cancer can be detected and often prevented. Most routine colorectal cancer screening begins at the age of 54 and continues through age 25. However, your health care provider may recommend screening at an earlier age if you have risk factors for colon cancer. On a yearly basis, your health care provider may provide home test kits to check for hidden blood in the stool. A small camera at the end of a tube may be used to directly examine the colon (sigmoidoscopy or colonoscopy) to detect the earliest forms of colorectal cancer. Talk to your health care provider about this at age 19 when routine screening begins. A direct exam of the colon should be repeated every 5-10 years through age 83, unless early forms of precancerous polyps or small growths are found.  People who are at an increased risk for hepatitis B should be screened for this virus. You are considered at high risk for hepatitis B if:  You were born in a country where hepatitis B occurs often. Talk with your health care provider about which countries are considered high risk.  Your parents were born in a high-risk country and you have not received a shot to protect against hepatitis B (hepatitis B vaccine).  You have HIV or AIDS.  You use needles to inject street drugs.  You live with, or have sex with, someone who has hepatitis B.  You are a man who has sex with other men (MSM).  You get hemodialysis treatment.  You take certain medicines for conditions like cancer, organ transplantation, and autoimmune conditions.  Hepatitis C blood testing is recommended for all people born from [birth year] through 1965 and any individual with known risk factors for hepatitis C.  Healthy men should no longer receive prostate-specific antigen (PSA) blood tests as part of routine cancer screening. Talk to your health care provider about prostate cancer  screening.  Testicular cancer screening is not recommended for adolescents or adult males who have no symptoms. Screening includes self-exam, a health care provider exam, and other screening tests. Consult with your health care provider about any symptoms you have or any concerns you have about testicular cancer.  Practice safe sex. Use condoms and avoid high-risk sexual practices to reduce the spread of sexually transmitted infections (STIs).  You should be screened for STIs, including gonorrhea and chlamydia if:  You are sexually active and are younger than 24 years.  You are older than 24 years, and your health care provider tells you that you are at risk for this type of infection.  Your sexual activity has changed since you were last screened, and  you are at an increased risk for chlamydia or gonorrhea. Ask your health care provider if you are at risk.  If you are at risk of being infected with HIV, it is recommended that you take a prescription medicine daily to prevent HIV infection. This is called pre-exposure prophylaxis (PrEP). You are considered at risk if:  You are a man who has sex with other men (MSM).  You are a heterosexual man who is sexually active with multiple partners.  You take drugs by injection.  You are sexually active with a partner who has HIV.  Talk with your health care provider about whether you are at high risk of being infected with HIV. If you choose to begin PrEP, you should first be tested for HIV. You should then be tested every 3 months for as long as you are taking PrEP.  Use sunscreen. Apply sunscreen liberally and repeatedly throughout the day. You should seek shade when your shadow is shorter than you. Protect yourself by wearing long sleeves, pants, a wide-brimmed hat, and sunglasses year round whenever you are outdoors.  Tell your health care provider of new moles or changes in moles, especially if there is a change in shape or color. Also, tell  your health care provider if a mole is larger than the size of a pencil eraser.  A one-time screening for abdominal aortic aneurysm (AAA) and surgical repair of large AAAs by ultrasound is recommended for men aged 85-75 years who are current or former smokers.  Stay current with your vaccines (immunizations). Document Released: 06/14/2008 Document Revised: 12/22/2013 Document Reviewed: 05/14/2011 [facility] Patient Information 2015 [place], [place]. This information is not intended to replace advice given to you by your health care provider. Make sure you discuss any questions you have with your health care provider.  Advance Directive Advance directives are the legal documents that allow you to make choices about your health care and medical treatment if you cannot speak for yourself. Advance directives are a way for you to communicate your wishes to family, friends, and health care providers. The specified people can then convey your decisions about end-of-life care to avoid confusion if you should become unable to communicate. Ideally, the process of discussing and writing advance directives should happen over time rather than making decisions all at once. Advance directives can be modified as your situation changes, and you can change your mind at any time, even after you have signed the advance directives. Each state has its own laws regarding advance directives. You may want to check with your health care provider, attorney, or state representative about the law in your state. Below are some examples of advance directives. LIVING WILL A living will is a set of instructions documenting your wishes about medical care when you cannot care for yourself. It is used if you become:  Terminally ill.  Incapacitated.  Unable to communicate.  Unable to make decisions. Items to consider in your living will include:  The use or non-use of life-sustaining equipment, such as dialysis machines and  breathing machines (ventilators).  A do not resuscitate (DNR) order, which is the instruction not to use cardiopulmonary resuscitation (CPR) if breathing or heartbeat stops.  Tube feeding.  Withholding of food and fluids.  Comfort (palliative) care when the goal becomes comfort rather than a cure.  Organ and tissue donation. A living will does not give instructions about distribution of your money and property if you should pass away. It is advisable to seek the  expert advice of a lawyer in drawing up a will regarding your possessions. Decisions about taxes, beneficiaries, and asset distribution will be legally binding. This process can relieve your family and friends of any burdens surrounding disputes or questions that may come up about the allocation of your assets. DO NOT RESUSCITATE (DNR) A do not resuscitate (DNR) order is a request to not have CPR in the event that your heart stops beating or you stop breathing. Unless given other instructions, a health care provider will try to help any patient whose heart has stopped or who has stopped breathing.  HEALTH CARE PROXY AND DURABLE POWER OF ATTORNEY FOR HEALTH CARE A health care proxy is a person (agent) appointed to make medical decisions for you if you cannot. Generally, people choose someone they know well and trust to represent their preferences when they can no longer do so. You should be sure to ask this person for agreement to act as your agent. An agent may have to exercise judgment in the event of a medical decision for which your wishes are not known. The durable power of attorney for health care is the legal document that names your health care proxy. Once written, it should be:  Signed.  Notarized.  Dated.  Copied.  Witnessed.  Incorporated into your medical record. You may also want to appoint someone to manage your financial affairs if you cannot. This is called a durable power of attorney for finances. It is a separate  legal document from the durable power of attorney for health care. You may choose the same person or someone different from your health care proxy to act as your agent in financial matters. Document Released: 03/25/2008 Document Revised: 12/22/2013 Document Reviewed: 05/06/2013 [facility] Patient Information 2015 [place], [place]. This information is not intended to replace advice given to you by your health care provider. Make sure you discuss any questions you have with your health care provider.

## 2015-05-02 NOTE — Assessment & Plan Note (Signed)
1) Anticipatory Guidance: Discussed importance of wearing a seatbelt while driving and not texting while driving; changing batteries in smoke detector at least once annually; wearing suntan lotion when outside; eating a balanced and moderate diet; getting physical activity at least 30 minutes per day.  2) Immunizations / Screenings / Labs:  Prevnar given today. Patient was educated on the Shingles vaccination and has declined at this time. He is unsure of his last colonoscopy but will check - he is almost certain they do it every 5 years. All other screenings are up to date per recommendations. Obtain CBC, BMET, Lipid profile and TSH when fasting.    Overall well exam. Patient has cardiovascular risk factors including sedentary lifestyle, being overweight, and diabetes. He is currently working on improving his sedentary lifestyle by starting to go to the gym. This will also help with his BMI of 28 indicating that he is overweight. The goal is 30 minutes of exercise most days of the week of moderate level intensity. His diabetes is currently fairly well controlled and managed by endocrinology. Continue other healthy lifestyle choices. Follow-up prevention exam in 1 year. Follow-up office visits pending lab work is needed.

## 2015-05-02 NOTE — Progress Notes (Signed)
Subjective:    Patient ID: Andre Stout, male    DOB: Dec 07, 1940, 76 y.o.   MRN: [id number]  Chief Complaint  Patient presents with  . Annual Exam    Not fasting    HPI:  Andre Stout is a 75 y.o. male who presents today for an annual wellness visit.   1) Health Maintenance -   Diet - Averages about 3 meals per day. Meals consists of lean proteins, vegetables, starches; 5-6 cups of caffeine per day  Exercise - Working to get started back in the gym in about the next week - not currently  2) Preventative Exams / Immunizations:  Dental -- Due for exam   Vision -- Up to date   Health Maintenance  Topic Date Due  . OPHTHALMOLOGY EXAM  11/13/1950  . URINE MICROALBUMIN  11/13/1950  . TETANUS/TDAP  11/14/1959  . COLONOSCOPY  11/13/1990  . ZOSTAVAX  11/13/2000  . PNA vac Low Risk Adult (1 of 2 - PCV13) 11/13/2005  . INFLUENZA VACCINE  08/01/2015  . HEMOGLOBIN A1C  08/01/2015  . FOOT EXAM  02/01/2016  Does not recall colonoscopy - but will check Declines shingles vacination Prevnar today  There is no immunization history on file for this patient.   RISK FACTORS  Tobacco History  Smoking status  . Former Smoker -- 0.50 packs/day for 4 years  . Types: Cigarettes  . Quit date: 10/29/1974  Smokeless tobacco  . Never Used    Cardiac risk factors: advanced age (older than 107 for men, 21 for women), diabetes mellitus, male gender and sedentary lifestyle.  Depression Screen  Q1: Over the past two weeks, have you felt down, depressed or hopeless? No  Q2: Over the past two weeks, have you felt little interest or pleasure in doing things? No  Have you lost interest or pleasure in daily life? No  Do you often feel hopeless? No  Do you cry easily over simple problems? No  Activities of Daily Living In your present state of health, do you have any difficulty performing the following activities?:  Driving? No Managing money?  No Feeding yourself? No Getting from  bed to chair? No Climbing a flight of stairs? No Preparing food and eating?: No Bathing or showering? No Getting dressed: No Getting to the toilet? No Using the toilet: No Moving around from place to place: No In the past year have you fallen or had a near fall?:No   Home Safety Has smoke detector and wears seat belts. No firearms. No excess sun exposure. Are there smokers in your home (other than you)?  No Do you feel safe at home?  Yes  Hearing Difficulties: No Do you often ask people to speak up or repeat themselves? No Do you experience ringing or noises in your ears? No  Do you have difficulty understanding soft or whispered voices? No    Cognitive Testing  Alert? Yes   Normal Appearance? Yes  Oriented to person? Yes  Place? Yes   Time? Yes  Recall of three objects?  Yes  Can perform simple calculations? Yes  Displays appropriate judgment? Yes  Can read the correct time from a watch face? Yes  Do you feel that you have a problem with memory? No  Do you often misplace items? No   Advanced Directives have been discussed with the patient? Yes Would like information  Current Physicians/Providers and Suppliers  1. Terri Piedra, FNP - PCP/ Primary Care 2. Renato Shin, MD -  Endocrinology 3. Tempie Hoist, MD - Opthamology 4. Lowella Bandy, MD - Urology  Indicate any recent Medical Services you may have received from other than [facility] providers in the past year (date may be approximate).  All answers were reviewed with the patient and necessary referrals were made:  Mauricio Po, [place]   05/02/2015    No Known Allergies   Current Outpatient Prescriptions on File Prior to Visit  Medication Sig Dispense Refill  . Cholecalciferol (VITAMIN D3) 2000 UNITS TABS Take by mouth daily.    Marland Kitchen Cod Liver Oil 1000 MG CAPS Take by mouth daily.    . enalapril (VASOTEC) 5 MG tablet Take 1 tablet (5 mg total) by mouth 2 (two) times daily. 180 tablet 3  . insulin glargine (LANTUS) 100  UNIT/ML injection Inject 0.5 mLs (50 Units total) into the skin every morning. 20 mL 11  . Misc Natural Products (URINOZINC PO) Take by mouth daily.    . Multiple Vitamins-Minerals (CENTRUM ADULTS PO) Take by mouth daily.    . Omega 3 1200 MG CAPS Take by mouth daily.     No current facility-administered medications on file prior to visit.    Past Medical History  Diagnosis Date  . Diabetes mellitus without complication   . Prostate cancer     Been 3-4 years ago    Past Surgical History  Procedure Laterality Date  . Cataract extraction Bilateral     Family History  Problem Relation Age of Onset  . Diabetes Father   . Diabetes Paternal Grandfather     History   Social History  . Marital Status: Widowed    Spouse Name: N/A  . Number of Children: 1  . Years of Education: 12   Occupational History  . Retired    Social History Main Topics  . Smoking status: Former Smoker -- 0.50 packs/day for 4 years    Types: Cigarettes    Quit date: 10/29/1974  . Smokeless tobacco: Never Used  . Alcohol Use: No  . Drug Use: No  . Sexual Activity: Not Currently   Other Topics Concern  . Not on file   Social History Narrative   Born and raised in [place], [place]. Currently reside in a private residence by himself. Daughter lives in the area. No live. Fun: hunt and fish   Denies religious beliefs that would effect health care.      Review of Systems  Constitutional: Denies fever, chills, fatigue, or significant weight gain/loss. HENT: Head: Denies headache or neck pain Ears: Denies changes in hearing, ringing in ears, earache, drainage Nose: Denies discharge, stuffiness, itching, nosebleed, sinus pain Throat: Denies sore throat, hoarseness, dry mouth, sores, thrush Eyes: Denies loss/changes in vision, pain, redness, blurry/double vision, flashing lights Cardiovascular: Denies chest pain/discomfort, tightness, palpitations, shortness of breath with activity, difficulty lying  down, swelling, sudden awakening with shortness of breath Respiratory: Denies shortness of breath, cough, sputum production, wheezing Gastrointestinal: Denies dysphasia, heartburn, change in appetite, nausea, change in bowel habits, rectal bleeding, constipation, diarrhea, yellow skin or eyes Genitourinary: Denies frequency, urgency, burning/pain, blood in urine, incontinence, change in urinary strength. Musculoskeletal: Denies muscle/joint pain, stiffness, back pain, redness or swelling of joints, trauma Skin: Denies rashes, lumps, itching, dryness, color changes, or hair/nail changes Neurological: Denies dizziness, fainting, seizures, weakness, numbness, tingling, tremor Psychiatric - Denies nervousness, stress, depression or memory loss Endocrine: Denies heat or cold intolerance, sweating, frequent urination, excessive thirst, changes in appetite Hematologic: Denies ease of bruising or bleeding  Objective:     BP 132/72 mmHg  Pulse 57  Temp(Src) 98 F (36.7 C) (Oral)  Resp 18  Ht 5\' 4"  (1.626 m)  Wt 169 lb (76.658 kg)  BMI 28.99 kg/m2  SpO2 98% Nursing note and vital signs reviewed.  Physical Exam  Constitutional: He is oriented to person, place, and time. He appears well-developed and well-nourished.  HENT:  Head: Normocephalic.  Right Ear: Hearing, tympanic membrane, external ear and ear canal normal.  Left Ear: Hearing, tympanic membrane, external ear and ear canal normal.  Nose: Nose normal.  Mouth/Throat: Uvula is midline, oropharynx is clear and moist and mucous membranes are normal.  Eyes: Conjunctivae and EOM are normal. Pupils are equal, round, and reactive to light.  Neck: Neck supple. No JVD present. No tracheal deviation present. No thyromegaly present.  Cardiovascular: Normal rate, regular rhythm, normal heart sounds and intact distal pulses.   Pulmonary/Chest: Effort normal and breath sounds normal.  Abdominal: Soft. Bowel sounds are normal. He exhibits no  distension and no mass. There is no tenderness. There is no rebound and no guarding.  Musculoskeletal: Normal range of motion. He exhibits no edema or tenderness.  Lymphadenopathy:    He has no cervical adenopathy.  Neurological: He is alert and oriented to person, place, and time. He has normal reflexes. No cranial nerve deficit. He exhibits normal muscle tone. Coordination normal.  Skin: Skin is warm and dry.  Psychiatric: He has a normal mood and affect. His behavior is normal. Judgment and thought content normal.       Assessment & Plan:   During the course of the visit the patient was educated and counseled about appropriate screening and preventive services including:    Pneumococcal vaccine   Influenza vaccine  Prostate cancer screening  Colorectal cancer screening  Diabetes screening  Glaucoma screening  Nutrition counseling   Advanced directives: has NO advanced directive  - add't info requested. Referral to SW: no  Diet review for nutrition referral? Yes ____  Not Indicated _X___   Patient Instructions (the written plan) was given to the patient.  Medicare Attestation I have personally reviewed: The patient's medical and social history Their use of alcohol, tobacco or illicit drugs Their current medications and supplements The patient's functional ability including ADLs,fall risks, home safety risks, cognitive, and hearing and visual impairment Diet and physical activities Evidence for depression or mood disorders  The patient's weight, height, BMI,  have been recorded in the chart.  I have made referrals, counseling, and provided education to the patient based on review of the above and I have provided the patient with a written personalized care plan for preventive services.     Mauricio Po, [place]   05/02/2015

## 2015-05-02 NOTE — Progress Notes (Signed)
Pre visit review using our clinic review tool, if applicable. No additional management support is needed unless otherwise documented below in the visit note. 

## 2015-05-03 ENCOUNTER — Encounter: Payer: Self-pay | Admitting: Endocrinology

## 2015-05-03 ENCOUNTER — Ambulatory Visit (INDEPENDENT_AMBULATORY_CARE_PROVIDER_SITE_OTHER): Payer: Medicare Other | Admitting: Endocrinology

## 2015-05-03 VITALS — BP 124/76 | HR 60 | Temp 99.1°F | Wt 168.0 lb

## 2015-05-03 DIAGNOSIS — E119 Type 2 diabetes mellitus without complications: Secondary | ICD-10-CM | POA: Diagnosis not present

## 2015-05-03 LAB — MICROALBUMIN / CREATININE URINE RATIO
Creatinine,U: 56 mg/dL
Microalb Creat Ratio: 1.2 mg/g (ref 0.0–30.0)
Microalb, Ur: <0.7 mg/dL (ref 0.0–1.9)

## 2015-05-03 LAB — HEMOGLOBIN A1C: Hgb A1c MFr Bld: 11.1 % — ABNORMAL HIGH (ref 4.6–6.5)

## 2015-05-03 MED ORDER — INSULIN ISOPHANE HUMAN 100 UNIT/ML KWIKPEN: 60.0000 [IU] | PEN_INJECTOR | SUBCUTANEOUS | Status: DC

## 2015-05-03 NOTE — Progress Notes (Signed)
Subjective:    Patient ID: Andre Stout, male    DOB: [birth year], 75 y.o.   MRN: [id number]  HPI Pt returns for f/u of diabetes mellitus: DM type: Insulin-requiring type 2 Dx'ed: 9163 Complications: retinopathy Therapy: insulin since 2006 DKA: never Severe hypoglycemia: never Pancreatitis: never Other: he has chosen a qd insulin regimen.   Interval history: hx is from pt and wife.  no cbg record, but states cbg's vary from 80-200.  It is in general higher as the day goes on.  pt states he feels well in general.  Past Medical History  Diagnosis Date  . Diabetes mellitus without complication   . Prostate cancer     Been 3-4 years ago    Past Surgical History  Procedure Laterality Date  . Cataract extraction Bilateral     History   Social History  . Marital Status: Widowed    Spouse Name: N/A  . Number of Children: 1  . Years of Education: 12   Occupational History  . Retired    Social History Main Topics  . Smoking status: Former Smoker -- 0.50 packs/day for 4 years    Types: Cigarettes    Quit date: 10/29/1974  . Smokeless tobacco: Never Used  . Alcohol Use: No  . Drug Use: No  . Sexual Activity: Not Currently   Other Topics Concern  . Not on file   Social History Narrative   Born and raised in [place], [place]. Currently reside in a private residence by himself. Daughter lives in the area. No live. Fun: hunt and fish   Denies religious beliefs that would effect health care.     Current Outpatient Prescriptions on File Prior to Visit  Medication Sig Dispense Refill  . Cholecalciferol (VITAMIN D3) 2000 UNITS TABS Take by mouth daily.    Marland Kitchen Cod Liver Oil 1000 MG CAPS Take by mouth daily.    . enalapril (VASOTEC) 5 MG tablet Take 1 tablet (5 mg total) by mouth 2 (two) times daily. 180 tablet 3  . Misc Natural Products (URINOZINC PO) Take by mouth daily.    . Multiple Vitamins-Minerals (CENTRUM ADULTS PO) Take by mouth daily.    . Omega 3 1200 MG CAPS Take by  mouth daily.     No current facility-administered medications on file prior to visit.    No Known Allergies  Family History  Problem Relation Age of Onset  . Diabetes Father   . Diabetes Paternal Grandfather     BP 124/76 mmHg  Pulse 60  Temp(Src) 99.1 F (37.3 C) (Oral)  Wt 168 lb (76.204 kg)  SpO2 97%    Review of Systems He denies hypoglycemia and weight change.     Objective:   Physical Exam VITAL SIGNS:  See vs page GENERAL: no distress Pulses: dorsalis pedis intact bilat.   MSK: no deformity of the feet CV: no leg edema Skin:  no ulcer on the feet.  normal color and temp on the feet. Neuro: sensation is intact to touch on the feet    Lab Results  Component Value Date   HGBA1C 11.1* 05/03/2015      Assessment & Plan:  DM: ongoing poor control.  The pattern of his cbg's indicates he needs a faster-acting insulin  Patient is advised the following: Patient Instructions  check your blood sugar twice a day.  vary the time of day when you check, between before the 3 meals, and at bedtime.  also check if you have  symptoms of your blood sugar being too high or too low.  please keep a record of the readings and bring it to your next appointment here.  You can write it on any piece of paper.  please call us sooner if your blood sugar goes below 70, or if you have a lot of readings over 200.   blood tests are requested for you today.  We'll let you know about the results. If it is high, we'll change to a faster-acting insulin called "NPH." Please come back for a follow-up appointment in 2 months.     addendum: change lantus to "NPH," 60 units qam

## 2015-05-03 NOTE — Patient Instructions (Addendum)
check your blood sugar twice a day.  vary the time of day when you check, between before the 3 meals, and at bedtime.  also check if you have symptoms of your blood sugar being too high or too low.  please keep a record of the readings and bring it to your next appointment here.  You can write it on any piece of paper.  please call us sooner if your blood sugar goes below 70, or if you have a lot of readings over 200.   blood tests are requested for you today.  We'll let you know about the results. If it is high, we'll change to a faster-acting insulin called "NPH." Please come back for a follow-up appointment in 2 months.

## 2015-05-04 ENCOUNTER — Other Ambulatory Visit (INDEPENDENT_AMBULATORY_CARE_PROVIDER_SITE_OTHER): Payer: Medicare Other

## 2015-05-04 DIAGNOSIS — Z136 Encounter for screening for cardiovascular disorders: Secondary | ICD-10-CM | POA: Diagnosis not present

## 2015-05-04 DIAGNOSIS — Z0000 Encounter for general adult medical examination without abnormal findings: Secondary | ICD-10-CM

## 2015-05-04 DIAGNOSIS — E119 Type 2 diabetes mellitus without complications: Secondary | ICD-10-CM | POA: Diagnosis not present

## 2015-05-04 LAB — CBC
HEMATOCRIT: 43.2 % (ref 39.0–52.0)
HEMOGLOBIN: 14.7 g/dL (ref 13.0–17.0)
MCHC: 34 g/dL (ref 30.0–36.0)
MCV: 83.8 fl (ref 78.0–100.0)
PLATELETS: 203 10*3/uL (ref 150.0–400.0)
RBC: 5.16 Mil/uL (ref 4.22–5.81)
RDW: 14.5 % (ref 11.5–15.5)
WBC: 7.4 10*3/uL (ref 4.0–10.5)

## 2015-05-04 LAB — LIPID PANEL
CHOL/HDL RATIO: 3
Cholesterol: 166 mg/dL (ref 0–200)
HDL: 66.3 mg/dL (ref >39.00)
LDL Cholesterol: 85 mg/dL (ref 0–99)
NONHDL: 99.7
TRIGLYCERIDES: 75 mg/dL (ref 0.0–149.0)
VLDL: 15 mg/dL (ref 0.0–40.0)

## 2015-05-04 LAB — BASIC METABOLIC PANEL
BUN: 16 mg/dL (ref 6–23)
CALCIUM: 9.7 mg/dL (ref 8.4–10.5)
CO2: 26 mEq/L (ref 19–32)
CREATININE: 1.01 mg/dL (ref 0.40–1.50)
Chloride: 105 mEq/L (ref 96–112)
GFR: 76.65 mL/min (ref >60.00)
GLUCOSE: 81 mg/dL (ref 70–99)
POTASSIUM: 3.9 meq/L (ref 3.5–5.1)
Sodium: 137 mEq/L (ref 135–145)

## 2015-05-04 LAB — TSH: TSH: 2.21 u[IU]/mL (ref 0.35–4.50)

## 2015-05-05 ENCOUNTER — Telehealth: Payer: Self-pay | Admitting: Family

## 2015-05-05 NOTE — Telephone Encounter (Signed)
Please inform the patient that his white/red blood cells, thyroid, kidney function, electrolytes and cholesterol are all within the normal limits.

## 2015-05-06 ENCOUNTER — Telehealth: Payer: Self-pay | Admitting: Endocrinology

## 2015-05-06 MED ORDER — INSULIN PEN NEEDLE 31G X 8 MM MISC: Status: DC

## 2015-05-06 NOTE — Telephone Encounter (Signed)
Pt aware of results 

## 2015-05-06 NOTE — Telephone Encounter (Signed)
Rx for pen needles sent to patient's pharmacy. 

## 2015-05-06 NOTE — Telephone Encounter (Signed)
Patient need refill of Becton pen needles

## 2015-05-20 ENCOUNTER — Encounter (INDEPENDENT_AMBULATORY_CARE_PROVIDER_SITE_OTHER): Payer: Medicare Other | Admitting: Ophthalmology

## 2015-05-20 DIAGNOSIS — H35033 Hypertensive retinopathy, bilateral: Secondary | ICD-10-CM

## 2015-05-20 DIAGNOSIS — H43813 Vitreous degeneration, bilateral: Secondary | ICD-10-CM | POA: Diagnosis not present

## 2015-05-20 DIAGNOSIS — H40042 Steroid responder, left eye: Secondary | ICD-10-CM

## 2015-05-20 DIAGNOSIS — E11359 Type 2 diabetes mellitus with proliferative diabetic retinopathy without macular edema: Secondary | ICD-10-CM | POA: Diagnosis not present

## 2015-05-20 DIAGNOSIS — E11311 Type 2 diabetes mellitus with unspecified diabetic retinopathy with macular edema: Secondary | ICD-10-CM | POA: Diagnosis not present

## 2015-05-20 DIAGNOSIS — E11351 Type 2 diabetes mellitus with proliferative diabetic retinopathy with macular edema: Secondary | ICD-10-CM | POA: Diagnosis not present

## 2015-05-20 DIAGNOSIS — I10 Essential (primary) hypertension: Secondary | ICD-10-CM

## 2015-07-01 ENCOUNTER — Encounter (INDEPENDENT_AMBULATORY_CARE_PROVIDER_SITE_OTHER): Payer: Medicare Other | Admitting: Ophthalmology

## 2015-07-01 DIAGNOSIS — E11311 Type 2 diabetes mellitus with unspecified diabetic retinopathy with macular edema: Secondary | ICD-10-CM | POA: Diagnosis not present

## 2015-07-01 DIAGNOSIS — E11351 Type 2 diabetes mellitus with proliferative diabetic retinopathy with macular edema: Secondary | ICD-10-CM

## 2015-07-01 DIAGNOSIS — H43813 Vitreous degeneration, bilateral: Secondary | ICD-10-CM | POA: Diagnosis not present

## 2015-07-01 DIAGNOSIS — H35371 Puckering of macula, right eye: Secondary | ICD-10-CM | POA: Diagnosis not present

## 2015-07-01 DIAGNOSIS — E11359 Type 2 diabetes mellitus with proliferative diabetic retinopathy without macular edema: Secondary | ICD-10-CM | POA: Diagnosis not present

## 2015-07-01 DIAGNOSIS — H35033 Hypertensive retinopathy, bilateral: Secondary | ICD-10-CM | POA: Diagnosis not present

## 2015-07-01 DIAGNOSIS — I10 Essential (primary) hypertension: Secondary | ICD-10-CM

## 2015-07-05 ENCOUNTER — Ambulatory Visit (INDEPENDENT_AMBULATORY_CARE_PROVIDER_SITE_OTHER): Payer: Medicare Other | Admitting: Endocrinology

## 2015-07-05 ENCOUNTER — Encounter: Payer: Self-pay | Admitting: Endocrinology

## 2015-07-05 VITALS — BP 122/64 | HR 60 | Temp 97.0°F | Ht 64.0 in | Wt 164.0 lb

## 2015-07-05 DIAGNOSIS — E119 Type 2 diabetes mellitus without complications: Secondary | ICD-10-CM | POA: Diagnosis not present

## 2015-07-05 LAB — POCT GLYCOSYLATED HEMOGLOBIN (HGB A1C): Hemoglobin A1C: 11.9

## 2015-07-05 MED ORDER — INSULIN ISOPHANE HUMAN 100 UNIT/ML KWIKPEN: 80.0000 [IU] | PEN_INJECTOR | SUBCUTANEOUS | Status: DC

## 2015-07-05 NOTE — Patient Instructions (Addendum)
check your blood sugar twice a day.  vary the time of day when you check, between before the 3 meals, and at bedtime.  also check if you have symptoms of your blood sugar being too high or too low.  please keep a record of the readings and bring it to your next appointment here.  You can write it on any piece of paper.  please call us sooner if your blood sugar goes below 70, or if you have a lot of readings over 200.   Please increase the insulin to 80 units each morning.  Please call us next week, to tell us how the blood sugar is doing.  Please come back for a follow-up appointment in 2 months.

## 2015-07-05 NOTE — Progress Notes (Signed)
Subjective:    Patient ID: Andre Stout, male    DOB: [birth year], 75 y.o.   MRN: [id number]  HPI Pt returns for f/u of diabetes mellitus: DM type: Insulin-requiring type 2 Dx'ed: 5009 Complications: retinopathy Therapy: insulin since 2006 DKA: never Severe hypoglycemia: never Pancreatitis: never Other: he has chosen a qd insulin regimen.   Interval history: no cbg record, but states cbg's vary from 130-200.  It is in general higher as the day goes on.  He says he never misses the insulin.   Past Medical History  Diagnosis Date  . Diabetes mellitus without complication   . Prostate cancer     Been 3-4 years ago    Past Surgical History  Procedure Laterality Date  . Cataract extraction Bilateral     History   Social History  . Marital Status: Widowed    Spouse Name: N/A  . Number of Children: 1  . Years of Education: 12   Occupational History  . Retired    Social History Main Topics  . Smoking status: Former Smoker -- 0.50 packs/day for 4 years    Types: Cigarettes    Quit date: 10/29/1974  . Smokeless tobacco: Never Used  . Alcohol Use: No  . Drug Use: No  . Sexual Activity: Not Currently   Other Topics Concern  . Not on file   Social History Narrative   Born and raised in [place], [place]. Currently reside in a private residence by himself. Daughter lives in the area. No live. Fun: hunt and fish   Denies religious beliefs that would effect health care.     Current Outpatient Prescriptions on File Prior to Visit  Medication Sig Dispense Refill  . Cholecalciferol (VITAMIN D3) 2000 UNITS TABS Take by mouth daily.    Marland Kitchen Cod Liver Oil 1000 MG CAPS Take by mouth daily.    . enalapril (VASOTEC) 5 MG tablet Take 1 tablet (5 mg total) by mouth 2 (two) times daily. 180 tablet 3  . Insulin Pen Needle (B-D ULTRAFINE III SHORT PEN) 31G X 8 MM MISC Use to inject insulin 1 time per day 100 each 2  . Misc Natural Products (URINOZINC PO) Take by mouth daily.    .  Multiple Vitamins-Minerals (CENTRUM ADULTS PO) Take by mouth daily.    . Omega 3 1200 MG CAPS Take by mouth daily.     No current facility-administered medications on file prior to visit.    No Known Allergies  Family History  Problem Relation Age of Onset  . Diabetes Father   . Diabetes Paternal Grandfather     BP 122/64 mmHg  Pulse 60  Temp(Src) 97 F (36.1 C) (Oral)  Ht 5\' 4"  (1.626 m)  Wt 164 lb (74.39 kg)  BMI 28.14 kg/m2  SpO2 97%   Review of Systems He denies hypoglycemia    Objective:   Physical Exam VITAL SIGNS:  See vs page GENERAL: no distress Pulses: dorsalis pedis intact bilat.   MSK: no deformity of the feet CV: no leg edema Skin:  no ulcer on the feet.  normal color and temp on the feet. Neuro: sensation is intact to touch on the feet    A1c=11.9%    Assessment & Plan:  DM: he needs increased rx   Patient is advised the following: Patient Instructions  check your blood sugar twice a day.  vary the time of day when you check, between before the 3 meals, and at bedtime.  also check  if you have symptoms of your blood sugar being too high or too low.  please keep a record of the readings and bring it to your next appointment here.  You can write it on any piece of paper.  please call us sooner if your blood sugar goes below 70, or if you have a lot of readings over 200.   Please increase the insulin to 80 units each morning.  Please call us next week, to tell us how the blood sugar is doing.  Please come back for a follow-up appointment in 2 months.

## 2015-07-12 ENCOUNTER — Telehealth: Payer: Self-pay | Admitting: Endocrinology

## 2015-07-12 NOTE — Telephone Encounter (Signed)
Thanks for calling.  Please increase to 90 units qam. i'll see you next time.  Call sooner if you have cbg's over 200.

## 2015-07-12 NOTE — Telephone Encounter (Signed)
Patient called stating that he would like to provide Dr. Loanne Drilling with his sugar readings   100 in the am  200 in the pm   Please advise patient   Thank you

## 2015-07-12 NOTE — Telephone Encounter (Signed)
Due to [place] changing his medication he was told to report blood sugars today.Please see below

## 2015-07-13 NOTE — Telephone Encounter (Signed)
Pt understands he is to increase to 90 units.

## 2015-08-15 ENCOUNTER — Encounter (INDEPENDENT_AMBULATORY_CARE_PROVIDER_SITE_OTHER): Payer: Medicare Other | Admitting: Ophthalmology

## 2015-08-15 DIAGNOSIS — I10 Essential (primary) hypertension: Secondary | ICD-10-CM | POA: Diagnosis not present

## 2015-08-15 DIAGNOSIS — E11351 Type 2 diabetes mellitus with proliferative diabetic retinopathy with macular edema: Secondary | ICD-10-CM | POA: Diagnosis not present

## 2015-08-15 DIAGNOSIS — E11311 Type 2 diabetes mellitus with unspecified diabetic retinopathy with macular edema: Secondary | ICD-10-CM

## 2015-08-15 DIAGNOSIS — H35033 Hypertensive retinopathy, bilateral: Secondary | ICD-10-CM | POA: Diagnosis not present

## 2015-08-15 DIAGNOSIS — E11359 Type 2 diabetes mellitus with proliferative diabetic retinopathy without macular edema: Secondary | ICD-10-CM

## 2015-08-15 DIAGNOSIS — H43813 Vitreous degeneration, bilateral: Secondary | ICD-10-CM

## 2015-08-15 DIAGNOSIS — H35371 Puckering of macula, right eye: Secondary | ICD-10-CM

## 2015-09-06 ENCOUNTER — Encounter: Payer: Self-pay | Admitting: Endocrinology

## 2015-09-06 ENCOUNTER — Ambulatory Visit (INDEPENDENT_AMBULATORY_CARE_PROVIDER_SITE_OTHER): Payer: Medicare Other | Admitting: Endocrinology

## 2015-09-06 VITALS — BP 132/80 | HR 60 | Temp 97.9°F | Ht 64.0 in | Wt 171.0 lb

## 2015-09-06 DIAGNOSIS — E119 Type 2 diabetes mellitus without complications: Secondary | ICD-10-CM | POA: Diagnosis not present

## 2015-09-06 LAB — POCT GLYCOSYLATED HEMOGLOBIN (HGB A1C): Hemoglobin A1C: 11.5

## 2015-09-06 MED ORDER — INSULIN LISPRO PROT & LISPRO (75-25 MIX) 100 UNIT/ML KWIKPEN: 100.0000 [IU] | PEN_INJECTOR | Freq: Every day | SUBCUTANEOUS | Status: DC

## 2015-09-06 NOTE — Patient Instructions (Addendum)
check your blood sugar twice a day.  vary the time of day when you check, between before the 3 meals, and at bedtime.  also check if you have symptoms of your blood sugar being too high or too low.  please keep a record of the readings and bring it to your next appointment here.  You can write it on any piece of paper.  please call us sooner if your blood sugar goes below 70, or if you have a lot of readings over 200.   Please change the insulin to "75/25," 100 units each morning.   Please call us next week, to tell us how the blood sugar is doing.   Please come back for a follow-up appointment in 2 months.

## 2015-09-06 NOTE — Progress Notes (Signed)
Subjective:    Patient ID: Andre Stout, male    DOB: 1940-06-04, 75 y.o.   MRN: [id number]  HPI Pt returns for f/u of diabetes mellitus: DM type: Insulin-requiring type 2.   Dx'ed: 8250 Complications: retinopathy.   Therapy: insulin since 2006 DKA: never Severe hypoglycemia: never Pancreatitis: never Other: he has chosen a qd insulin regimen.   Interval history: no cbg record, but states cbg's vary from 80-200's.  It is in general higher as the day goes on.  He says he never misses the insulin.   Past Medical History  Diagnosis Date  . Diabetes mellitus without complication   . Prostate cancer     Been 3-4 years ago    Past Surgical History  Procedure Laterality Date  . Cataract extraction Bilateral     Social History   Social History  . Marital Status: Widowed    Spouse Name: N/A  . Number of Children: 1  . Years of Education: 12   Occupational History  . Retired    Social History Main Topics  . Smoking status: Former Smoker -- 0.50 packs/day for 4 years    Types: Cigarettes    Quit date: 10/29/1974  . Smokeless tobacco: Never Used  . Alcohol Use: No  . Drug Use: No  . Sexual Activity: Not Currently   Other Topics Concern  . Not on file   Social History Narrative   Born and raised in [place], [place]. Currently reside in a private residence by himself. Daughter lives in the area. No live. Fun: hunt and fish   Denies religious beliefs that would effect health care.     Current Outpatient Prescriptions on File Prior to Visit  Medication Sig Dispense Refill  . Cholecalciferol (VITAMIN D3) 2000 UNITS TABS Take by mouth daily.    Marland Kitchen Cod Liver Oil 1000 MG CAPS Take by mouth daily.    . enalapril (VASOTEC) 5 MG tablet Take 1 tablet (5 mg total) by mouth 2 (two) times daily. 180 tablet 3  . Insulin Pen Needle (B-D ULTRAFINE III SHORT PEN) 31G X 8 MM MISC Use to inject insulin 1 time per day 100 each 2  . Misc Natural Products (URINOZINC PO) Take by mouth daily.     . Multiple Vitamins-Minerals (CENTRUM ADULTS PO) Take by mouth daily.    . Omega 3 1200 MG CAPS Take by mouth daily.     No current facility-administered medications on file prior to visit.    No Known Allergies  Family History  Problem Relation Age of Onset  . Diabetes Father   . Diabetes Paternal Grandfather     BP 132/80 mmHg  Pulse 60  Temp(Src) 97.9 F (36.6 C) (Oral)  Ht 5\' 4"  (1.626 m)  Wt 171 lb (77.565 kg)  BMI 29.34 kg/m2  SpO2 97%  Review of Systems He denies hypoglycemia    Objective:   Physical Exam VITAL SIGNS:  See vs page GENERAL: no distress Pulses: dorsalis pedis intact bilat.   MSK: no deformity of the feet CV: no leg edema Skin:  no ulcer on the feet.  normal color and temp on the feet. Neuro: sensation is intact to touch on the feet   A1c=11.5%     Assessment & Plan:  DM: severe exacerbation: the pattern of his cbg's indicates he needs a faster-acting qd insulin.    Patient is advised the following: Patient Instructions  check your blood sugar twice a day.  vary the time of  day when you check, between before the 3 meals, and at bedtime.  also check if you have symptoms of your blood sugar being too high or too low.  please keep a record of the readings and bring it to your next appointment here.  You can write it on any piece of paper.  please call us sooner if your blood sugar goes below 70, or if you have a lot of readings over 200.   Please change the insulin to "75/25," 100 units each morning.   Please call us next week, to tell us how the blood sugar is doing.   Please come back for a follow-up appointment in 2 months.

## 2015-09-26 ENCOUNTER — Encounter (INDEPENDENT_AMBULATORY_CARE_PROVIDER_SITE_OTHER): Payer: Medicare Other | Admitting: Ophthalmology

## 2015-09-26 DIAGNOSIS — H35033 Hypertensive retinopathy, bilateral: Secondary | ICD-10-CM | POA: Diagnosis not present

## 2015-09-26 DIAGNOSIS — I10 Essential (primary) hypertension: Secondary | ICD-10-CM | POA: Diagnosis not present

## 2015-09-26 DIAGNOSIS — E11359 Type 2 diabetes mellitus with proliferative diabetic retinopathy without macular edema: Secondary | ICD-10-CM

## 2015-09-26 DIAGNOSIS — H43813 Vitreous degeneration, bilateral: Secondary | ICD-10-CM

## 2015-09-26 DIAGNOSIS — E11351 Type 2 diabetes mellitus with proliferative diabetic retinopathy with macular edema: Secondary | ICD-10-CM | POA: Diagnosis not present

## 2015-09-26 DIAGNOSIS — E11311 Type 2 diabetes mellitus with unspecified diabetic retinopathy with macular edema: Secondary | ICD-10-CM

## 2015-11-07 ENCOUNTER — Ambulatory Visit (INDEPENDENT_AMBULATORY_CARE_PROVIDER_SITE_OTHER): Payer: Medicare Other | Admitting: Endocrinology

## 2015-11-07 ENCOUNTER — Encounter (INDEPENDENT_AMBULATORY_CARE_PROVIDER_SITE_OTHER): Payer: Medicare Other | Admitting: Ophthalmology

## 2015-11-07 DIAGNOSIS — I10 Essential (primary) hypertension: Secondary | ICD-10-CM | POA: Diagnosis not present

## 2015-11-07 DIAGNOSIS — E113513 Type 2 diabetes mellitus with proliferative diabetic retinopathy with macular edema, bilateral: Secondary | ICD-10-CM | POA: Diagnosis not present

## 2015-11-07 DIAGNOSIS — E11311 Type 2 diabetes mellitus with unspecified diabetic retinopathy with macular edema: Secondary | ICD-10-CM

## 2015-11-07 DIAGNOSIS — H43813 Vitreous degeneration, bilateral: Secondary | ICD-10-CM | POA: Diagnosis not present

## 2015-11-07 DIAGNOSIS — H35033 Hypertensive retinopathy, bilateral: Secondary | ICD-10-CM | POA: Diagnosis not present

## 2015-11-07 DIAGNOSIS — E119 Type 2 diabetes mellitus without complications: Secondary | ICD-10-CM

## 2015-11-09 ENCOUNTER — Encounter: Payer: Self-pay | Admitting: Endocrinology

## 2015-11-09 ENCOUNTER — Ambulatory Visit (INDEPENDENT_AMBULATORY_CARE_PROVIDER_SITE_OTHER): Payer: Medicare Other | Admitting: Endocrinology

## 2015-11-09 VITALS — BP 122/64 | HR 74 | Temp 97.8°F | Ht 64.0 in | Wt 160.0 lb

## 2015-11-09 DIAGNOSIS — E119 Type 2 diabetes mellitus without complications: Secondary | ICD-10-CM | POA: Diagnosis not present

## 2015-11-09 LAB — POCT GLYCOSYLATED HEMOGLOBIN (HGB A1C): HEMOGLOBIN A1C: 12

## 2015-11-09 MED ORDER — INSULIN NPH (HUMAN) (ISOPHANE) 100 UNIT/ML ~~LOC~~ SUSP: 90.0000 [IU] | SUBCUTANEOUS | Status: DC

## 2015-11-09 NOTE — Progress Notes (Signed)
Subjective:    Patient ID: Andre Stout, male    DOB: 01-15-1940, 75 y.o.   MRN: [id number]  HPI Pt returns for f/u of diabetes mellitus: DM type: Insulin-requiring type 2.   Dx'ed: 5102 Complications: retinopathy.   Therapy: insulin since 2006 DKA: never Severe hypoglycemia: never Pancreatitis: never Other: he has chosen a qd insulin regimen.   Interval history: When he took 100 units qam, he had mild hypoglycemia approx 1 hr after taking.  He was taking it just after breakfast.  Therefore, he takes 20-30 units 3 times a day (just before each meal).  no cbg record, but states cbg's vary from 200-500.   Past Medical History  Diagnosis Date  . Diabetes mellitus without complication ([place])   . Prostate cancer ([place])     Been 3-4 years ago    Past Surgical History  Procedure Laterality Date  . Cataract extraction Bilateral     Social History   Social History  . Marital Status: Widowed    Spouse Name: N/A  . Number of Children: 1  . Years of Education: 12   Occupational History  . Retired    Social History Main Topics  . Smoking status: Former Smoker -- 0.50 packs/day for 4 years    Types: Cigarettes    Quit date: 10/29/1974  . Smokeless tobacco: Never Used  . Alcohol Use: No  . Drug Use: No  . Sexual Activity: Not Currently   Other Topics Concern  . Not on file   Social History Narrative   Born and raised in [place], [place]. Currently reside in a private residence by himself. Daughter lives in the area. No live. Fun: hunt and fish   Denies religious beliefs that would effect health care.     Current Outpatient Prescriptions on File Prior to Visit  Medication Sig Dispense Refill  . Cholecalciferol (VITAMIN D3) 2000 UNITS TABS Take by mouth daily.    Marland Kitchen Cod Liver Oil 1000 MG CAPS Take by mouth daily.    . enalapril (VASOTEC) 5 MG tablet Take 1 tablet (5 mg total) by mouth 2 (two) times daily. 180 tablet 3  . Insulin Pen Needle (B-D ULTRAFINE III SHORT PEN) 31G X  8 MM MISC Use to inject insulin 1 time per day 100 each 2  . Misc Natural Products (URINOZINC PO) Take by mouth daily.    . Multiple Vitamins-Minerals (CENTRUM ADULTS PO) Take by mouth daily.    . Omega 3 1200 MG CAPS Take by mouth daily.     No current facility-administered medications on file prior to visit.    No Known Allergies  Family History  Problem Relation Age of Onset  . Diabetes Father   . Diabetes Paternal Grandfather     BP 122/64 mmHg  Pulse 74  Temp(Src) 97.8 F (36.6 C) (Oral)  Ht 5\' 4"  (1.626 m)  Wt 160 lb (72.576 kg)  BMI 27.45 kg/m2  SpO2 97%  Review of Systems Denies LOC    Objective:   Physical Exam VITAL SIGNS:  See vs page GENERAL: no distress Pulses: dorsalis pedis intact bilat.   MSK: no deformity of the feet CV: no leg edema Skin:  no ulcer on the feet.  normal color and temp on the feet. Neuro: sensation is intact to touch on the feet.   A1c=12%    Assessment & Plan:  DM: severe exacerbation.   therapy limited by noncompliance.  i'll do the best i can, but he seems  to need a slower-release qd insulin.    Patient is advised the following: Patient Instructions  check your blood sugar twice a day.  vary the time of day when you check, between before the 3 meals, and at bedtime.  also check if you have symptoms of your blood sugar being too high or too low.  please keep a record of the readings and bring it to your next appointment here.  You can write it on any piece of paper.  please call us sooner if your blood sugar goes below 70, or if you have a lot of readings over 200.   Please change the insulin back to NPH, 90 units each morning.  On this type of insulin schedule, you should eat meals on a regular schedule.  If a meal is missed or significantly delayed, your blood sugar could go low. Please come back for a follow-up appointment in 3 months.

## 2015-11-09 NOTE — Patient Instructions (Addendum)
check your blood sugar twice a day.  vary the time of day when you check, between before the 3 meals, and at bedtime.  also check if you have symptoms of your blood sugar being too high or too low.  please keep a record of the readings and bring it to your next appointment here.  You can write it on any piece of paper.  please call us sooner if your blood sugar goes below 70, or if you have a lot of readings over 200.   Please change the insulin back to NPH, 90 units each morning.  On this type of insulin schedule, you should eat meals on a regular schedule.  If a meal is missed or significantly delayed, your blood sugar could go low. Please come back for a follow-up appointment in 3 months.

## 2015-11-10 ENCOUNTER — Telehealth: Payer: Self-pay | Admitting: Endocrinology

## 2015-11-10 NOTE — Telephone Encounter (Signed)
Patient did show for his appointment on 11/09/2015 and was seen by Doctor Loanne Drilling.

## 2015-11-10 NOTE — Telephone Encounter (Signed)
Patient no showed today's appt. Please advise on how to follow up. °A. No follow up necessary. °B. Follow up urgent. Contact patient immediately. °C. Follow up necessary. Contact patient and schedule visit in ___ days. °D. Follow up advised. Contact patient and schedule visit in ____weeks. ° °

## 2015-11-12 NOTE — Progress Notes (Signed)
   Subjective:    Patient ID: Andre Stout, male    DOB: Aug 05, 1940, 75 y.o.   MRN: [id number]  HPI -   Review of Systems     Objective:   Physical Exam        Assessment & Plan:

## 2015-11-14 ENCOUNTER — Other Ambulatory Visit: Payer: Self-pay

## 2015-11-14 MED ORDER — ENALAPRIL MALEATE 5 MG PO TABS: 5.0000 mg | ORAL_TABLET | Freq: Two times a day (BID) | ORAL | Status: DC

## 2015-12-19 ENCOUNTER — Encounter (INDEPENDENT_AMBULATORY_CARE_PROVIDER_SITE_OTHER): Payer: Medicare Other | Admitting: Ophthalmology

## 2015-12-19 DIAGNOSIS — E11311 Type 2 diabetes mellitus with unspecified diabetic retinopathy with macular edema: Secondary | ICD-10-CM

## 2015-12-19 DIAGNOSIS — H43813 Vitreous degeneration, bilateral: Secondary | ICD-10-CM

## 2015-12-19 DIAGNOSIS — E113512 Type 2 diabetes mellitus with proliferative diabetic retinopathy with macular edema, left eye: Secondary | ICD-10-CM | POA: Diagnosis not present

## 2015-12-19 DIAGNOSIS — H35033 Hypertensive retinopathy, bilateral: Secondary | ICD-10-CM | POA: Diagnosis not present

## 2015-12-19 DIAGNOSIS — I10 Essential (primary) hypertension: Secondary | ICD-10-CM | POA: Diagnosis not present

## 2015-12-19 DIAGNOSIS — E113591 Type 2 diabetes mellitus with proliferative diabetic retinopathy without macular edema, right eye: Secondary | ICD-10-CM | POA: Diagnosis not present

## 2016-01-30 ENCOUNTER — Encounter (INDEPENDENT_AMBULATORY_CARE_PROVIDER_SITE_OTHER): Payer: Medicare Other | Admitting: Ophthalmology

## 2016-01-30 DIAGNOSIS — H43813 Vitreous degeneration, bilateral: Secondary | ICD-10-CM | POA: Diagnosis not present

## 2016-01-30 DIAGNOSIS — I10 Essential (primary) hypertension: Secondary | ICD-10-CM

## 2016-01-30 DIAGNOSIS — H35033 Hypertensive retinopathy, bilateral: Secondary | ICD-10-CM

## 2016-01-30 DIAGNOSIS — E113512 Type 2 diabetes mellitus with proliferative diabetic retinopathy with macular edema, left eye: Secondary | ICD-10-CM

## 2016-01-30 DIAGNOSIS — E113591 Type 2 diabetes mellitus with proliferative diabetic retinopathy without macular edema, right eye: Secondary | ICD-10-CM | POA: Diagnosis not present

## 2016-01-30 DIAGNOSIS — E11311 Type 2 diabetes mellitus with unspecified diabetic retinopathy with macular edema: Secondary | ICD-10-CM

## 2016-01-30 LAB — HM DIABETES EYE EXAM

## 2016-02-10 ENCOUNTER — Ambulatory Visit (INDEPENDENT_AMBULATORY_CARE_PROVIDER_SITE_OTHER): Payer: Medicare Other | Admitting: Endocrinology

## 2016-02-10 ENCOUNTER — Encounter: Payer: Self-pay | Admitting: Endocrinology

## 2016-02-10 VITALS — BP 128/70 | HR 59 | Temp 97.6°F | Ht 64.0 in | Wt 164.0 lb

## 2016-02-10 DIAGNOSIS — E119 Type 2 diabetes mellitus without complications: Secondary | ICD-10-CM

## 2016-02-10 DIAGNOSIS — Z794 Long term (current) use of insulin: Secondary | ICD-10-CM | POA: Diagnosis not present

## 2016-02-10 LAB — POCT GLYCOSYLATED HEMOGLOBIN (HGB A1C): HEMOGLOBIN A1C: 11.8

## 2016-02-10 MED ORDER — INSULIN DETEMIR 100 UNIT/ML ~~LOC~~ SOLN: 100.0000 [IU] | SUBCUTANEOUS | Status: DC

## 2016-02-10 NOTE — Progress Notes (Signed)
Subjective:    Patient ID: Andre Stout, male    DOB: [birth year], 76 y.o.   MRN: [id number]  HPI Pt returns for f/u of diabetes mellitus: DM type: Insulin-requiring type 2.   Dx'ed: 0000000 Complications: retinopathy.   Therapy: insulin since 2006 DKA: never Severe hypoglycemia: never.  Pancreatitis: never Other: he has chosen a qd insulin regimen; he had to d/c pioglitizone in 2016, due to edema; he takes human insulin, due to cost; on lantus, he had am hypoglycemia, and PM hyperglycemia; on 75/25, he had the opposite. Interval history: When he took 90 units qam, he had mild hypoglycemia at lunch.  Therefore, he changed to 50 qam and 40 units qpm.  Pt says he never misses the insulin.  no cbg record, but states cbg's are in general higher as the day goes on  Past Medical History  Diagnosis Date  . Diabetes mellitus without complication ([place])   . Prostate cancer ([place])     Been 3-4 years ago    Past Surgical History  Procedure Laterality Date  . Cataract extraction Bilateral     Social History   Social History  . Marital Status: Widowed    Spouse Name: N/A  . Number of Children: 1  . Years of Education: 12   Occupational History  . Retired    Social History Main Topics  . Smoking status: Former Smoker -- 0.50 packs/day for 4 years    Types: Cigarettes    Quit date: 10/29/1974  . Smokeless tobacco: Never Used  . Alcohol Use: No  . Drug Use: No  . Sexual Activity: Not Currently   Other Topics Concern  . Not on file   Social History Narrative   Born and raised in [place], [place]. Currently reside in a private residence by himself. Daughter lives in the area. No live. Fun: hunt and fish   Denies religious beliefs that would effect health care.     Current Outpatient Prescriptions on File Prior to Visit  Medication Sig Dispense Refill  . Cholecalciferol (VITAMIN D3) 2000 UNITS TABS Take by mouth daily.    Marland Kitchen Cod Liver Oil 1000 MG CAPS Take by mouth daily.    .  enalapril (VASOTEC) 5 MG tablet Take 1 tablet (5 mg total) by mouth 2 (two) times daily. 180 tablet 3  . Misc Natural Products (URINOZINC PO) Take by mouth daily.    . Multiple Vitamins-Minerals (CENTRUM ADULTS PO) Take by mouth daily.    . Omega 3 1200 MG CAPS Take by mouth daily.     No current facility-administered medications on file prior to visit.    No Known Allergies  Family History  Problem Relation Age of Onset  . Diabetes Father   . Diabetes Paternal Grandfather     BP 128/70 mmHg  Pulse 59  Temp(Src) 97.6 F (36.4 C) (Oral)  Ht 5\' 4"  (1.626 m)  Wt 164 lb (74.39 kg)  BMI 28.14 kg/m2  SpO2 95%  Review of Systems Denies LOC    Objective:   Physical Exam VITAL SIGNS:  See vs page GENERAL: no distress Pulses: dorsalis pedis intact bilat.   MSK: no deformity of the feet CV: no leg edema Skin:  no ulcer on the feet.  normal color and temp on the feet. Neuro: sensation is intact to touch on the feet.    Lab Results  Component Value Date   CREATININE 1.01 05/04/2015   BUN 16 05/04/2015   NA 137 05/04/2015  K 3.9 05/04/2015   CL 105 05/04/2015   CO2 26 05/04/2015  a1c=11.6%     Assessment & Plan:  DM: The pattern of his cbg's indicates he needs a slower-acting qd insulin.   Patient is advised the following: Patient Instructions  check your blood sugar twice a day.  vary the time of day when you check, between before the 3 meals, and at bedtime.  also check if you have symptoms of your blood sugar being too high or too low.  please keep a record of the readings and bring it to your next appointment here.  You can write it on any piece of paper.  please call us sooner if your blood sugar goes below 70, or if you have a lot of readings over 200.   Please change the insulin to "levemir," 100 units each morning.  On this type of insulin schedule, you should eat meals on a regular schedule.  If a meal is missed or significantly delayed, your blood sugar could go  low. Please come back for a follow-up appointment in 2-3 weeks.

## 2016-02-10 NOTE — Patient Instructions (Addendum)
check your blood sugar twice a day.  vary the time of day when you check, between before the 3 meals, and at bedtime.  also check if you have symptoms of your blood sugar being too high or too low.  please keep a record of the readings and bring it to your next appointment here.  You can write it on any piece of paper.  please call us sooner if your blood sugar goes below 70, or if you have a lot of readings over 200.   Please change the insulin to "levemir," 100 units each morning.  On this type of insulin schedule, you should eat meals on a regular schedule.  If a meal is missed or significantly delayed, your blood sugar could go low. Please come back for a follow-up appointment in 2-3 weeks.

## 2016-03-02 ENCOUNTER — Ambulatory Visit: Payer: Medicare Other | Admitting: Endocrinology

## 2016-03-04 ENCOUNTER — Observation Stay (HOSPITAL_COMMUNITY)
Admission: EM | Admit: 2016-03-04 | Discharge: 2016-03-07 | Disposition: A | Discharge status: Home / Self Care | Payer: Medicare Other | Attending: Internal Medicine | Admitting: Internal Medicine

## 2016-03-04 ENCOUNTER — Encounter (HOSPITAL_COMMUNITY): Payer: Self-pay | Admitting: *Deleted

## 2016-03-04 ENCOUNTER — Emergency Department (HOSPITAL_COMMUNITY): Payer: Medicare Other

## 2016-03-04 DIAGNOSIS — N179 Acute kidney failure, unspecified: Secondary | ICD-10-CM | POA: Diagnosis not present

## 2016-03-04 DIAGNOSIS — J101 Influenza due to other identified influenza virus with other respiratory manifestations: Principal | ICD-10-CM | POA: Diagnosis present

## 2016-03-04 DIAGNOSIS — I10 Essential (primary) hypertension: Secondary | ICD-10-CM | POA: Diagnosis not present

## 2016-03-04 DIAGNOSIS — Z794 Long term (current) use of insulin: Secondary | ICD-10-CM | POA: Insufficient documentation

## 2016-03-04 DIAGNOSIS — Z8546 Personal history of malignant neoplasm of prostate: Secondary | ICD-10-CM | POA: Insufficient documentation

## 2016-03-04 DIAGNOSIS — Z09 Encounter for follow-up examination after completed treatment for conditions other than malignant neoplasm: Secondary | ICD-10-CM | POA: Diagnosis not present

## 2016-03-04 DIAGNOSIS — E1110 Type 2 diabetes mellitus with ketoacidosis without coma: Secondary | ICD-10-CM

## 2016-03-04 DIAGNOSIS — E1310 Other specified diabetes mellitus with ketoacidosis without coma: Secondary | ICD-10-CM | POA: Diagnosis not present

## 2016-03-04 DIAGNOSIS — R531 Weakness: Secondary | ICD-10-CM | POA: Diagnosis present

## 2016-03-04 DIAGNOSIS — R112 Nausea with vomiting, unspecified: Secondary | ICD-10-CM

## 2016-03-04 DIAGNOSIS — R790 Abnormal level of blood mineral: Secondary | ICD-10-CM | POA: Diagnosis present

## 2016-03-04 LAB — I-STAT CHEM 8, ED
BUN: 33 mg/dL — ABNORMAL HIGH (ref 6–20)
CHLORIDE: 102 mmol/L (ref 101–111)
CREATININE: 1.2 mg/dL (ref 0.61–1.24)
Calcium, Ion: 1.19 mmol/L (ref 1.13–1.30)
GLUCOSE: 652 mg/dL — AB (ref 65–99)
HCT: 48 % (ref 39.0–52.0)
Hemoglobin: 16.3 g/dL (ref 13.0–17.0)
POTASSIUM: 4.6 mmol/L (ref 3.5–5.1)
Sodium: 133 mmol/L — ABNORMAL LOW (ref 135–145)
TCO2: 9 mmol/L (ref 0–100)

## 2016-03-04 LAB — CBC WITH DIFFERENTIAL/PLATELET
BASOS PCT: 0 %
Band Neutrophils: 0 %
Basophils Absolute: 0 10*3/uL (ref 0.0–0.1)
Blasts: 0 %
EOS PCT: 0 %
Eosinophils Absolute: 0 10*3/uL (ref 0.0–0.7)
HCT: 47.8 % (ref 39.0–52.0)
Hemoglobin: 15.7 g/dL (ref 13.0–17.0)
LYMPHS ABS: 1.7 10*3/uL (ref 0.7–4.0)
Lymphocytes Relative: 14 %
MCH: 28.9 pg (ref 26.0–34.0)
MCHC: 32.8 g/dL (ref 30.0–36.0)
MCV: 88 fL (ref 78.0–100.0)
METAMYELOCYTES PCT: 0 %
Monocytes Absolute: 0.9 10*3/uL (ref 0.1–1.0)
Monocytes Relative: 7 %
Myelocytes: 0 %
NEUTROS ABS: 9.8 10*3/uL — AB (ref 1.7–7.7)
NRBC: 0 /100{WBCs}
Neutrophils Relative %: 79 %
OTHER: 0 %
Platelets: 216 10*3/uL (ref 150–400)
Promyelocytes Absolute: 0 %
RBC: 5.43 MIL/uL (ref 4.22–5.81)
RDW: 13.5 % (ref 11.5–15.5)
WBC: 12.4 10*3/uL — AB (ref 4.0–10.5)

## 2016-03-04 LAB — LIPASE, BLOOD: LIPASE: 22 U/L (ref 11–51)

## 2016-03-04 LAB — URINALYSIS, ROUTINE W REFLEX MICROSCOPIC
BILIRUBIN URINE: NEGATIVE
Glucose, UA: >1000 mg/dL — AB
Hgb urine dipstick: NEGATIVE
Ketones, ur: >80 mg/dL — AB
LEUKOCYTES UA: NEGATIVE
NITRITE: NEGATIVE
PH: 5 (ref 5.0–8.0)
Protein, ur: NEGATIVE mg/dL
SPECIFIC GRAVITY, URINE: 1.027 (ref 1.005–1.030)

## 2016-03-04 LAB — BASIC METABOLIC PANEL
Anion gap: 13 (ref 5–15)
BUN: 35 mg/dL — AB (ref 6–20)
BUN: 30 mg/dL — AB (ref 6–20)
CALCIUM: 9.2 mg/dL (ref 8.9–10.3)
CO2: 13 mmol/L — ABNORMAL LOW (ref 22–32)
CO2: 18 mmol/L — ABNORMAL LOW (ref 22–32)
CREATININE: 1.71 mg/dL — AB (ref 0.61–1.24)
CREATININE: 1.29 mg/dL — AB (ref 0.61–1.24)
Calcium: 8.9 mg/dL (ref 8.9–10.3)
Chloride: 102 mmol/L (ref 101–111)
Chloride: 108 mmol/L (ref 101–111)
GFR calc Af Amer: 43 mL/min — ABNORMAL LOW (ref >60)
GFR calc Af Amer: >60 mL/min (ref >60)
GFR, EST NON AFRICAN AMERICAN: 37 mL/min — AB (ref >60)
GFR, EST NON AFRICAN AMERICAN: 53 mL/min — AB (ref >60)
GLUCOSE: 424 mg/dL — AB (ref 65–99)
GLUCOSE: 210 mg/dL — AB (ref 65–99)
POTASSIUM: 4.9 mmol/L (ref 3.5–5.1)
POTASSIUM: 4.3 mmol/L (ref 3.5–5.1)
SODIUM: 139 mmol/L (ref 135–145)
Sodium: 136 mmol/L (ref 135–145)

## 2016-03-04 LAB — CBG MONITORING, ED
Glucose-Capillary: >600 mg/dL (ref 65–99)
Glucose-Capillary: >600 mg/dL (ref 65–99)
Glucose-Capillary: 516 mg/dL — ABNORMAL HIGH (ref 65–99)

## 2016-03-04 LAB — COMPREHENSIVE METABOLIC PANEL
ALT: 18 U/L (ref 17–63)
AST: 20 U/L (ref 15–41)
Albumin: 3.2 g/dL — ABNORMAL LOW (ref 3.5–5.0)
Alkaline Phosphatase: 86 U/L (ref 38–126)
BILIRUBIN TOTAL: 2.4 mg/dL — AB (ref 0.3–1.2)
BUN: 39 mg/dL — ABNORMAL HIGH (ref 6–20)
CALCIUM: 9.1 mg/dL (ref 8.9–10.3)
CHLORIDE: 92 mmol/L — AB (ref 101–111)
CO2: 9 mmol/L — ABNORMAL LOW (ref 22–32)
CREATININE: 1.85 mg/dL — AB (ref 0.61–1.24)
GFR, EST AFRICAN AMERICAN: 39 mL/min — AB (ref >60)
GFR, EST NON AFRICAN AMERICAN: 34 mL/min — AB (ref >60)
Glucose, Bld: 795 mg/dL (ref 65–99)
Potassium: 5.7 mmol/L — ABNORMAL HIGH (ref 3.5–5.1)
Sodium: 130 mmol/L — ABNORMAL LOW (ref 135–145)
Total Protein: 6.8 g/dL (ref 6.5–8.1)

## 2016-03-04 LAB — GLUCOSE, CAPILLARY
GLUCOSE-CAPILLARY: 425 mg/dL — AB (ref 65–99)
GLUCOSE-CAPILLARY: 251 mg/dL — AB (ref 65–99)
Glucose-Capillary: 323 mg/dL — ABNORMAL HIGH (ref 65–99)
Glucose-Capillary: 396 mg/dL — ABNORMAL HIGH (ref 65–99)
Glucose-Capillary: 187 mg/dL — ABNORMAL HIGH (ref 65–99)
Glucose-Capillary: 145 mg/dL — ABNORMAL HIGH (ref 65–99)

## 2016-03-04 LAB — URINE MICROSCOPIC-ADD ON
BACTERIA UA: NONE SEEN
SQUAMOUS EPITHELIAL / LPF: NONE SEEN

## 2016-03-04 LAB — I-STAT CG4 LACTIC ACID, ED
LACTIC ACID, VENOUS: 5.38 mmol/L — AB (ref 0.5–2.0)
Lactic Acid, Venous: 3.85 mmol/L (ref 0.5–2.0)

## 2016-03-04 LAB — MRSA PCR SCREENING: MRSA by PCR: NEGATIVE

## 2016-03-04 MED ORDER — ALBUTEROL SULFATE (2.5 MG/3ML) 0.083% IN NEBU
5.0000 mg | INHALATION_SOLUTION | Freq: Once | RESPIRATORY_TRACT | Status: AC
  Administered 2016-03-04: 5 mg via RESPIRATORY_TRACT
  Filled 2016-03-04: qty 6

## 2016-03-04 MED ORDER — DEXTROSE-NACL 5-0.45 % IV SOLN
INTRAVENOUS | Status: DC
Start: 2016-03-04 — End: 2016-03-05
  Administered 2016-03-04 – 2016-03-05 (×2): via INTRAVENOUS

## 2016-03-04 MED ORDER — ENALAPRIL MALEATE 5 MG PO TABS
5.0000 mg | ORAL_TABLET | Freq: Two times a day (BID) | ORAL | Status: DC
  Administered 2016-03-04 – 2016-03-07 (×6): 5 mg via ORAL
  Filled 2016-03-04 (×8): qty 1

## 2016-03-04 MED ORDER — BRIMONIDINE TARTRATE 0.15 % OP SOLN
1.0000 [drp] | Freq: Two times a day (BID) | OPHTHALMIC | Status: DC
  Administered 2016-03-04 – 2016-03-07 (×5): 1 [drp] via OPHTHALMIC
  Filled 2016-03-04 (×2): qty 5

## 2016-03-04 MED ORDER — TIMOLOL MALEATE 0.5 % OP SOLN
1.0000 [drp] | Freq: Two times a day (BID) | OPHTHALMIC | Status: DC
  Administered 2016-03-04 – 2016-03-06 (×5): 1 [drp] via OPHTHALMIC
  Filled 2016-03-04 (×3): qty 5

## 2016-03-04 MED ORDER — BRIMONIDINE TARTRATE-TIMOLOL 0.2-0.5 % OP SOLN: 1.0000 [drp] | Freq: Two times a day (BID) | OPHTHALMIC | Status: DC

## 2016-03-04 MED ORDER — SODIUM CHLORIDE 0.9 % IV BOLUS (SEPSIS)
2000.0000 mL | Freq: Once | INTRAVENOUS | Status: AC
  Administered 2016-03-04: 2000 mL via INTRAVENOUS

## 2016-03-04 MED ORDER — ACETAMINOPHEN 325 MG PO TABS: 650.0000 mg | ORAL_TABLET | ORAL | Status: DC | PRN

## 2016-03-04 MED ORDER — ONDANSETRON HCL 4 MG/2ML IJ SOLN: 4.0000 mg | Freq: Four times a day (QID) | INTRAMUSCULAR | Status: DC | PRN

## 2016-03-04 MED ORDER — SODIUM CHLORIDE 0.9 % IV BOLUS (SEPSIS)
500.0000 mL | Freq: Once | INTRAVENOUS | Status: AC
  Administered 2016-03-04: 500 mL via INTRAVENOUS

## 2016-03-04 MED ORDER — BRIMONIDINE TARTRATE 0.2 % OP SOLN
1.0000 [drp] | Freq: Two times a day (BID) | OPHTHALMIC | Status: DC
  Administered 2016-03-05 – 2016-03-07 (×5): 1 [drp] via OPHTHALMIC
  Filled 2016-03-04 (×3): qty 5

## 2016-03-04 MED ORDER — ENOXAPARIN SODIUM 40 MG/0.4ML ~~LOC~~ SOLN
40.0000 mg | SUBCUTANEOUS | Status: DC
  Administered 2016-03-04 – 2016-03-06 (×3): 40 mg via SUBCUTANEOUS
  Filled 2016-03-04 (×3): qty 0.4

## 2016-03-04 MED ORDER — HYDROMORPHONE HCL 1 MG/ML IJ SOLN: 0.5000 mg | INTRAMUSCULAR | Status: DC | PRN

## 2016-03-04 MED ORDER — SODIUM CHLORIDE 0.9 % IV SOLN
INTRAVENOUS | Status: DC
  Administered 2016-03-04: 13.7 [IU]/h via INTRAVENOUS
  Administered 2016-03-04: 5.4 [IU]/h via INTRAVENOUS
  Filled 2016-03-04: qty 2.5

## 2016-03-04 MED ORDER — SODIUM CHLORIDE 0.9 % IV SOLN
Freq: Once | INTRAVENOUS | Status: AC
  Administered 2016-03-04: 100 mL/h via INTRAVENOUS

## 2016-03-04 MED ORDER — SODIUM CHLORIDE 0.9 % IV SOLN
INTRAVENOUS | Status: AC
Start: 2016-03-04 — End: 2016-03-04

## 2016-03-04 MED ORDER — DEXTROSE-NACL 5-0.45 % IV SOLN: INTRAVENOUS | Status: DC

## 2016-03-04 MED ORDER — INSULIN REGULAR HUMAN 100 UNIT/ML IJ SOLN
INTRAMUSCULAR | Status: DC
  Filled 2016-03-04: qty 2.5

## 2016-03-04 MED ORDER — SODIUM CHLORIDE 0.9 % IV SOLN: INTRAVENOUS | Status: DC

## 2016-03-04 NOTE — ED Notes (Signed)
Daughter states pt pt developed fever 4 days ago, progressed to cough, now emesis, noted to lethargic at intervals

## 2016-03-04 NOTE — ED Notes (Signed)
Called main lab to inquire about results on labs -  Talked to Manuela Schwartz -  CBC should be resulted in 5 min.  Couldn't give me an answer about CMP -  She talked to the person running chemistries -  Didn't seem know anything about it.  Was told that they have been having issues with their equipment and they would have results soon.  Did talk to EDP and RN about this.  (they didn't call anyone in the ED to tell us about the equipment issues)

## 2016-03-04 NOTE — H&P (Addendum)
[facility] Admission History and Physical       DEMETROUS GUILLETTE [id number] DOB: Jul 22, 1940 DOA: 03/04/2016  Referring physician: EDP PCP: Mauricio Po, FNP  Specialists:   Chief Complaint: Weakness Increased Thirst and Urination  HPI: Andre Stout is a 76 y.o. male with a history of IDDM who presents to the ED with complaints of weakness, increased thirst and urination for the past 2-3 days.  His family reports that he has been drinking a lot of Dr Malachi Bonds, and eating cake the past day.  He was found to have a glucose level of 795 with an Anion Gap of 29 in the ED.   He was placed on the DKA protocol and referred for admission.       Review of Systems:  Constitutional: No Weight Loss, No Weight Gain, Night Sweats, Fevers, Chills, Dizziness, Light Headedness, Fatigue, +Polydipsia, +Generalized Weakness HEENT: No Headaches, Difficulty Swallowing,Tooth/Dental Problems,Sore Throat,  No Sneezing, Rhinitis, Ear Ache, Nasal Congestion, or Post Nasal Drip,  Cardio-vascular:  No Chest pain, Orthopnea, PND, Edema in Lower Extremities, Anasarca, Dizziness, Palpitations  Resp: No Dyspnea, No DOE, No Productive Cough, No Non-Productive Cough, No Hemoptysis, No Wheezing.    GI: No Heartburn, Indigestion, Abdominal Pain, Nausea, Vomiting, Diarrhea, Constipation, Hematemesis, Hematochezia, Melena, Change in Bowel Habits,  Loss of Appetite  GU: No Dysuria, No Change in Color of Urine, +Polyuria, No Flank pain.  Musculoskeletal: No Joint Pain or Swelling, No Decreased Range of Motion, No Back Pain.  Neurologic: No Syncope, No Seizures, Muscle Weakness, Paresthesia, Vision Disturbance or Loss, No Diplopia, No Vertigo, No Difficulty Walking,  Skin: No Rash or Lesions. Psych: No Change in Mood or Affect, No Depression or Anxiety, No Memory loss, No Confusion, or Hallucinations   Past Medical History  Diagnosis Date  . Diabetes mellitus without complication ([place])   . Prostate cancer ([place])      Been 3-4 years ago     Past Surgical History  Procedure Laterality Date  . Cataract extraction Bilateral       Prior to Admission medications   Medication Sig Start Date End Date Taking? Authorizing Provider  ALPHAGAN P 0.15 % ophthalmic solution Place 1 drop into both eyes 2 (two) times daily. 02/11/16  Yes Historical Provider, MD  Cholecalciferol (VITAMIN D3) 2000 UNITS TABS Take 1 tablet by mouth daily.    Yes Historical Provider, MD  [facility] Liver Oil 1000 MG CAPS Take 1 capsule by mouth daily.    Yes Historical Provider, MD  COMBIGAN 0.2-0.5 % ophthalmic solution Place 1 drop into both eyes 2 (two) times daily. 12/19/15  Yes Historical Provider, MD  enalapril (VASOTEC) 5 MG tablet Take 1 tablet (5 mg total) by mouth 2 (two) times daily. 11/14/15  Yes Golden Circle, FNP  insulin detemir (LEVEMIR) 100 UNIT/ML injection Inject 1 mL (100 Units total) into the skin every morning. And syringes 1/day 02/10/16  Yes Renato Shin, MD  Misc Natural Products (URINOZINC PO) Take 1 tablet by mouth daily.    Yes Historical Provider, MD  Multiple Vitamins-Minerals (CENTRUM ADULTS PO) Take 1 tablet by mouth daily.    Yes Historical Provider, MD  Omega 3 1200 MG CAPS Take 1 capsule by mouth daily.    Yes Historical Provider, MD  PRESCRIPTION MEDICATION Gets injections in eyes at eye dr's office   Yes Historical Provider, MD     No Known Allergies     Social History:  reports that he quit smoking about 41 years  ago. His smoking use included Cigarettes. He has a 2 pack-year smoking history. He has never used smokeless tobacco. He reports that he does not drink alcohol or use illicit drugs.    Family History  Problem Relation Age of Onset  . Diabetes Father   . Diabetes Paternal Grandfather        Physical Exam:  GEN:  Pleasant Elderly Thin   76 y.o. male examined and in no acute distress; cooperative with exam Filed Vitals:   03/04/16 1227 03/04/16 1230 03/04/16 1300 03/04/16 1330  BP:  129/67 131/83 139/73 143/69  Pulse: 110 108 110 106  Temp:      TempSrc:      Resp: 25 24 19 20   Height:      Weight:      SpO2: 100% 100% 100% 100%   Blood pressure 143/69, pulse 106, temperature 97.6 F (36.4 C), temperature source Rectal, resp. rate 20, height 5\' 3"  (1.6 m), weight 72.576 kg (160 lb), SpO2 100 %. PSYCH: He is alert and oriented x4; does not appear anxious does not appear depressed; affect is normal HEENT: Normocephalic and Atraumatic, Mucous membranes pink; PERRLA; EOM intact; Fundi:  Benign;  No scleral icterus, Nares: Patent, Oropharynx: Clear, Edentulous or Fair Dentition,    Neck:  FROM, No Cervical Lymphadenopathy nor Thyromegaly or Carotid Bruit; No JVD; Breasts:: Not examined CHEST WALL: No tenderness CHEST: Normal respiration, clear to auscultation bilaterally HEART: Regular rate and rhythm; no murmurs rubs or gallops BACK: No kyphosis or scoliosis; No CVA tenderness ABDOMEN: Positive Bowel Sounds, Scaphoid, Soft Non-Tender, No Rebound or Guarding; No Masses, No Organomegaly, No Pannus; No Intertriginous candida. Rectal Exam: Not done EXTREMITIES: No Cyanosis, Clubbing, or Edema; No Ulcerations. Genitalia: not examined PULSES: 2+ and symmetric SKIN: Normal hydration no rash or ulceration CNS:  Alert and Oriented x 4, No Focal Deficits Vascular: pulses palpable throughout    Labs on Admission:  Basic Metabolic Panel:  Recent Labs Lab 03/04/16 1149 03/04/16 1417  NA 130* 133*  K 5.7* 4.6  CL 92* 102  CO2 9*  --   GLUCOSE 795* 652*  BUN 39* 33*  CREATININE 1.85* 1.20  CALCIUM 9.1  --    Liver Function Tests:  Recent Labs Lab 03/04/16 1149  AST 20  ALT 18  ALKPHOS 86  BILITOT 2.4*  PROT 6.8  ALBUMIN 3.2*    Recent Labs Lab 03/04/16 1149  LIPASE 22   No results for input(s): AMMONIA in the last 168 hours. CBC:  Recent Labs Lab 03/04/16 1044 03/04/16 1417  WBC 12.4*  --   NEUTROABS 9.8*  --   HGB 15.7 16.3  HCT 47.8 48.0    MCV 88.0  --   PLT 216  --    Cardiac Enzymes: No results for input(s): CKTOTAL, CKMB, CKMBINDEX, TROPONINI in the last 168 hours.  BNP (last 3 results) No results for input(s): BNP in the last 8760 hours.  ProBNP (last 3 results) No results for input(s): PROBNP in the last 8760 hours.  CBG:  Recent Labs Lab 03/04/16 1040 03/04/16 1139 03/04/16 1257 03/04/16 1405  GLUCAP >600* >600* >600* >600*    Radiological Exams on Admission: Dg Chest 2 View  03/04/2016  CLINICAL DATA:  Fever, cough, fatigue, and vomiting for 2 days, former smoker, diabetes mellitus, prostate cancer EXAM: CHEST  2 VIEW COMPARISON:  02/17/2010 FINDINGS: Normal heart size, mediastinal contours, and pulmonary vascularity. Atherosclerotic calcification aorta. Lungs minimally hyperinflated but clear. No pulmonary infiltrate, pleural  effusion or pneumothorax. No acute osseous findings. IMPRESSION: No acute abnormalities. Electronically Signed   By: Lavonia Dana M.D.   On: 03/04/2016 11:08     EKG: Independently reviewed. Normal sinus Rhythm Rate = 97 , Diffuse Artifact Present    Assessment/Plan:     76 y.o. male with  Active Problems:    Diabetic ketoacidosis without coma associated with type 2 diabetes mellitus (HCC)   DKA IV Insulin Protocol   SDU Monitoring   Monitor Electrolytes    AKI- due to DKA   IVFs   Monitor BUN/Cr     DVT Prophylaxis   Lovenox     Code Status:     FULL CODE    Family Communication:   Grandson at Bedside    Disposition Plan:    Inpatient Status with Expected LOS 2-3 days  Time spent:  20 Minutes      Theressa Millard [facility] Pager [phone]   If [place] Please Contact the Day Rounding Team MD for [facility]  If 7PM-7AM, Please Contact Night-Floor Coverage  [email or web address] Password [email or web address] 03/04/2016, 2:30 PM     ADDENDUM:   Patient was seen and examined on 03/04/2016

## 2016-03-04 NOTE — ED Provider Notes (Signed)
CSN: [id number]     Arrival date & time 03/04/16  1001 History   First MD Initiated Contact with Patient 03/04/16 1020     Chief Complaint  Patient presents with  . Emesis  . Cough  . lethargic        HPI  Patient with a three-day illness. Fever up to 100 at home. Nausea and several episodes of emesis over the last 2 days. Coughing. Less active and weak. History of diabetes. Sugars have not been low at home. He did receive a flu vaccination. Denies chest pain. Denies abdominal pain. No diarrhea. He has urinated today. Past medical history  significant for his diabetes and prostate cancer.  Daughter states that he has had upper respiratory infection symptoms for the better part of this week. She states she is "drinking nothing but diet Coke". No free water intake. Was at a wedding yesterday. According to his family "ate cake all day long". Vomiting started last night.  Past Medical History  Diagnosis Date  . Diabetes mellitus without complication ([place])   . Prostate cancer ([place])     Been 3-4 years ago   Past Surgical History  Procedure Laterality Date  . Cataract extraction Bilateral    Family History  Problem Relation Age of Onset  . Diabetes Father   . Diabetes Paternal Grandfather    Social History  Substance Use Topics  . Smoking status: Former Smoker -- 0.50 packs/day for 4 years    Types: Cigarettes    Quit date: 10/29/1974  . Smokeless tobacco: Never Used  . Alcohol Use: No    Review of Systems  Constitutional: Positive for fever. Negative for chills, diaphoresis, appetite change and fatigue.  HENT: Negative for mouth sores, sore throat and trouble swallowing.   Eyes: Negative for visual disturbance.  Respiratory: Positive for cough. Negative for chest tightness, shortness of breath and wheezing.   Cardiovascular: Negative for chest pain.  Gastrointestinal: Positive for nausea and vomiting. Negative for abdominal pain, diarrhea and abdominal distention.   Endocrine: Negative for polydipsia, polyphagia and polyuria.  Genitourinary: Negative for dysuria, frequency and hematuria.  Musculoskeletal: Negative for gait problem.  Skin: Negative for color change, pallor and rash.  Neurological: Negative for dizziness, syncope, light-headedness and headaches.  Hematological: Does not bruise/bleed easily.  Psychiatric/Behavioral: Negative for behavioral problems and confusion.      Allergies  Review of patient's allergies indicates no known allergies.  Home Medications   Prior to Admission medications   Medication Sig Start Date End Date Taking? Authorizing Provider  ALPHAGAN P 0.15 % ophthalmic solution Place 1 drop into both eyes 2 (two) times daily. 02/11/16  Yes Historical Provider, MD  Cholecalciferol (VITAMIN D3) 2000 UNITS TABS Take 1 tablet by mouth daily.    Yes Historical Provider, MD  [facility] Liver Oil 1000 MG CAPS Take 1 capsule by mouth daily.    Yes Historical Provider, MD  COMBIGAN 0.2-0.5 % ophthalmic solution Place 1 drop into both eyes 2 (two) times daily. 12/19/15  Yes Historical Provider, MD  enalapril (VASOTEC) 5 MG tablet Take 1 tablet (5 mg total) by mouth 2 (two) times daily. 11/14/15  Yes Golden Circle, FNP  Misc Natural Products (URINOZINC PO) Take 1 tablet by mouth daily.    Yes Historical Provider, MD  Multiple Vitamins-Minerals (CENTRUM ADULTS PO) Take 1 tablet by mouth daily.    Yes Historical Provider, MD  Omega 3 1200 MG CAPS Take 1 capsule by mouth daily.    Yes  Historical Provider, MD  PRESCRIPTION MEDICATION Gets injections in eyes at eye dr's office   Yes Historical Provider, MD  insulin detemir (LEVEMIR) 100 UNIT/ML injection Inject 0.4 mLs (40 Units total) into the skin every morning. And syringes 1/day 03/08/16   Modena Jansky, MD  insulin lispro (HUMALOG) 100 UNIT/ML injection Inject 0-0.09 mLs (0-9 Units total) into the skin 3 (three) times daily with meals. CBG < 70: Eat or drink something and recheck, CBG  70 - 120: 0 units, CBG 121 - 150: 1 unit, CBG 151 - 200: 2 units, CBG 201 - 250: 3 units, CBG 251 - 300: 5 units, CBG 301 - 350: 7 units, CBG 351 - 400: 9 units, CBG > 400: call MD. 03/07/16   Modena Jansky, MD  oseltamivir (TAMIFLU) 75 MG capsule Take 1 capsule (75 mg total) by mouth 2 (two) times daily. 03/07/16   Modena Jansky, MD   BP 128/64 mmHg  Pulse 59  Temp(Src) 98 F (36.7 C) (Oral)  Resp 20  Ht 5\' 3"  (1.6 m)  Wt 160 lb (72.576 kg)  BMI 28.35 kg/m2  SpO2 99% Physical Exam  Constitutional: He is oriented to person, place, and time. He appears well-developed and well-nourished. No distress.  Elderly male. Awake. Does not appear distressed  HENT:  Head: Normocephalic.  Conjunctivae do not appear pale. No scleral icterus.  Eyes: Conjunctivae are normal. Pupils are equal, round, and reactive to light. No scleral icterus.  Neck: Normal range of motion. Neck supple. No thyromegaly present.  Cardiovascular: Normal rate and regular rhythm.  Exam reveals no gallop and no friction rub.   No murmur heard. Pulmonary/Chest: Effort normal and breath sounds normal. No respiratory distress. He has no wheezes. He has no rales.  Clear bilateral breath sounds. No wheezing rales or rhonchi.  Abdominal: Soft. Bowel sounds are normal. He exhibits no distension. There is no tenderness. There is no rebound.  Abdomen soft nondistended nontender. Normal active bowel sounds.  Musculoskeletal: Normal range of motion.  Neurological: He is alert and oriented to person, place, and time.  Skin: Skin is warm and dry. No rash noted.  Psychiatric: He has a normal mood and affect. His behavior is normal.    ED Course  Procedures (including critical care time) Labs Review Labs Reviewed  CBC WITH DIFFERENTIAL/PLATELET - Abnormal; Notable for the following:    WBC 12.4 (*)    Neutro Abs 9.8 (*)    All other components within normal limits  URINALYSIS, ROUTINE W REFLEX MICROSCOPIC (NOT AT [facility]) -  Abnormal; Notable for the following:    Glucose, UA >1000 (*)    Ketones, ur >80 (*)    All other components within normal limits  COMPREHENSIVE METABOLIC PANEL - Abnormal; Notable for the following:    Sodium 130 (*)    Potassium 5.7 (*)    Chloride 92 (*)    CO2 9 (*)    Glucose, Bld 795 (*)    BUN 39 (*)    Creatinine, Ser 1.85 (*)    Albumin 3.2 (*)    Total Bilirubin 2.4 (*)    GFR calc non Af Amer 34 (*)    GFR calc Af Amer 39 (*)    All other components within normal limits  BASIC METABOLIC PANEL - Abnormal; Notable for the following:    CO2 13 (*)    Glucose, Bld 424 (*)    BUN 35 (*)    Creatinine, Ser 1.71 (*)  GFR calc non Af Amer 37 (*)    GFR calc Af Amer 43 (*)    All other components within normal limits  BASIC METABOLIC PANEL - Abnormal; Notable for the following:    CO2 18 (*)    Glucose, Bld 210 (*)    BUN 30 (*)    Creatinine, Ser 1.29 (*)    GFR calc non Af Amer 53 (*)    All other components within normal limits  BASIC METABOLIC PANEL - Abnormal; Notable for the following:    CO2 21 (*)    Glucose, Bld 121 (*)    BUN 28 (*)    Calcium 8.8 (*)    All other components within normal limits  GLUCOSE, CAPILLARY - Abnormal; Notable for the following:    Glucose-Capillary 425 (*)    All other components within normal limits  GLUCOSE, CAPILLARY - Abnormal; Notable for the following:    Glucose-Capillary 323 (*)    All other components within normal limits  GLUCOSE, CAPILLARY - Abnormal; Notable for the following:    Glucose-Capillary 396 (*)    All other components within normal limits  GLUCOSE, CAPILLARY - Abnormal; Notable for the following:    Glucose-Capillary 251 (*)    All other components within normal limits  GLUCOSE, CAPILLARY - Abnormal; Notable for the following:    Glucose-Capillary 187 (*)    All other components within normal limits  GLUCOSE, CAPILLARY - Abnormal; Notable for the following:    Glucose-Capillary 145 (*)    All other  components within normal limits  GLUCOSE, CAPILLARY - Abnormal; Notable for the following:    Glucose-Capillary 114 (*)    All other components within normal limits  GLUCOSE, CAPILLARY - Abnormal; Notable for the following:    Glucose-Capillary 116 (*)    All other components within normal limits  BASIC METABOLIC PANEL - Abnormal; Notable for the following:    CO2 21 (*)    Glucose, Bld 107 (*)    BUN 27 (*)    Calcium 8.8 (*)    All other components within normal limits  GLUCOSE, CAPILLARY - Abnormal; Notable for the following:    Glucose-Capillary 214 (*)    All other components within normal limits  GLUCOSE, CAPILLARY - Abnormal; Notable for the following:    Glucose-Capillary 118 (*)    All other components within normal limits  GLUCOSE, CAPILLARY - Abnormal; Notable for the following:    Glucose-Capillary 117 (*)    All other components within normal limits  BASIC METABOLIC PANEL - Abnormal; Notable for the following:    Chloride 112 (*)    CO2 20 (*)    Glucose, Bld 132 (*)    BUN 23 (*)    Calcium 8.7 (*)    All other components within normal limits  GLUCOSE, CAPILLARY - Abnormal; Notable for the following:    Glucose-Capillary 241 (*)    All other components within normal limits  GLUCOSE, CAPILLARY - Abnormal; Notable for the following:    Glucose-Capillary 140 (*)    All other components within normal limits  CBC WITH DIFFERENTIAL/PLATELET - Abnormal; Notable for the following:    HCT 37.8 (*)    Monocytes Absolute 1.3 (*)    All other components within normal limits  GLUCOSE, CAPILLARY - Abnormal; Notable for the following:    Glucose-Capillary 144 (*)    All other components within normal limits  INFLUENZA PANEL BY PCR (TYPE A & B, H1N1) - Abnormal; Notable  for the following:    Influenza A By PCR POSITIVE (*)    All other components within normal limits  GLUCOSE, CAPILLARY - Abnormal; Notable for the following:    Glucose-Capillary 131 (*)    All other  components within normal limits  GLUCOSE, CAPILLARY - Abnormal; Notable for the following:    Glucose-Capillary 160 (*)    All other components within normal limits  GLUCOSE, CAPILLARY - Abnormal; Notable for the following:    Glucose-Capillary 185 (*)    All other components within normal limits  GLUCOSE, CAPILLARY - Abnormal; Notable for the following:    Glucose-Capillary 180 (*)    All other components within normal limits  CBC WITH DIFFERENTIAL/PLATELET - Abnormal; Notable for the following:    Hemoglobin 12.4 (*)    HCT 37.2 (*)    All other components within normal limits  COMPREHENSIVE METABOLIC PANEL - Abnormal; Notable for the following:    CO2 21 (*)    Glucose, Bld 225 (*)    Total Protein 5.8 (*)    Albumin 2.5 (*)    ALT 14 (*)    All other components within normal limits  MAGNESIUM - Abnormal; Notable for the following:    Magnesium 1.2 (*)    All other components within normal limits  PHOSPHORUS - Abnormal; Notable for the following:    Phosphorus 1.7 (*)    All other components within normal limits  GLUCOSE, RANDOM - Abnormal; Notable for the following:    Glucose, Bld 293 (*)    All other components within normal limits  GLUCOSE, CAPILLARY - Abnormal; Notable for the following:    Glucose-Capillary 419 (*)    All other components within normal limits  GLUCOSE, CAPILLARY - Abnormal; Notable for the following:    Glucose-Capillary 164 (*)    All other components within normal limits  GLUCOSE, CAPILLARY - Abnormal; Notable for the following:    Glucose-Capillary 166 (*)    All other components within normal limits  GLUCOSE, CAPILLARY - Abnormal; Notable for the following:    Glucose-Capillary 134 (*)    All other components within normal limits  GLUCOSE, CAPILLARY - Abnormal; Notable for the following:    Glucose-Capillary 152 (*)    All other components within normal limits  CBC - Abnormal; Notable for the following:    Hemoglobin 12.6 (*)    HCT 37.7  (*)    All other components within normal limits  BASIC METABOLIC PANEL - Abnormal; Notable for the following:    Glucose, Bld 184 (*)    All other components within normal limits  GLUCOSE, CAPILLARY - Abnormal; Notable for the following:    Glucose-Capillary 133 (*)    All other components within normal limits  GLUCOSE, CAPILLARY - Abnormal; Notable for the following:    Glucose-Capillary 126 (*)    All other components within normal limits  GLUCOSE, CAPILLARY - Abnormal; Notable for the following:    Glucose-Capillary 157 (*)    All other components within normal limits  GLUCOSE, CAPILLARY - Abnormal; Notable for the following:    Glucose-Capillary 164 (*)    All other components within normal limits  I-STAT CG4 LACTIC ACID, ED - Abnormal; Notable for the following:    Lactic Acid, Venous 5.38 (*)    All other components within normal limits  CBG MONITORING, ED - Abnormal; Notable for the following:    Glucose-Capillary >600 (*)    All other components within normal limits  CBG MONITORING,  ED - Abnormal; Notable for the following:    Glucose-Capillary >600 (*)    All other components within normal limits  I-STAT CHEM 8, ED - Abnormal; Notable for the following:    Sodium 133 (*)    BUN 33 (*)    Glucose, Bld 652 (*)    All other components within normal limits  I-STAT CG4 LACTIC ACID, ED - Abnormal; Notable for the following:    Lactic Acid, Venous 3.85 (*)    All other components within normal limits  CBG MONITORING, ED - Abnormal; Notable for the following:    Glucose-Capillary >600 (*)    All other components within normal limits  CBG MONITORING, ED - Abnormal; Notable for the following:    Glucose-Capillary >600 (*)    All other components within normal limits  CBG MONITORING, ED - Abnormal; Notable for the following:    Glucose-Capillary 516 (*)    All other components within normal limits  MRSA PCR SCREENING  LIPASE, BLOOD  URINE MICROSCOPIC-ADD ON  GLUCOSE,  CAPILLARY  TSH  CBC WITH DIFFERENTIAL/PLATELET    Imaging Review No results found. I have personally reviewed and evaluated these images and lab results as part of my medical decision-making.   EKG Interpretation   Date/Time:  Sunday March 04 2016 10:35:13 EST Ventricular Rate:  97 PR Interval:  240 QRS Duration: 99 QT Interval:  356 QTC Calculation: 452 R Axis:   -158 Text Interpretation:  Sinus rhythm Prolonged PR interval RAE, consider  biatrial enlargement Anterior infarct, old Borderline T abnormalities,  inferior leads Baseline wander in lead(s) II III aVF ED PHYSICIAN  INTERPRETATION AVAILABLE IN [facility] Confirmed by TEST, Record  ([id number]) on 03/05/2016 6:39:44 AM      MDM   Final diagnoses:  Diabetic ketoacidosis without coma associated with type 2 diabetes mellitus (HCC)  Non-intractable vomiting with nausea, vomiting of unspecified type   Discussed with [facility] hospitalist. Patient will be admitted.  11 fluid boluses and IV fluids. Started on insulin drip and glucose stabilizer.  CRITICAL CARE Performed by: Tanna Furry JOSEPH   Total critical care time: 30 minutes  Critical care time was exclusive of separately billable procedures and treating other patients.  Critical care was necessary to treat or prevent imminent or life-threatening deterioration.  Critical care was time spent personally by me on the following activities: development of treatment plan with patient and/or surrogate as well as nursing, discussions with consultants, evaluation of patient's response to treatment, examination of patient, obtaining history from patient or surrogate, ordering and performing treatments and interventions, ordering and review of laboratory studies, ordering and review of radiographic studies, pulse oximetry and re-evaluation of patient's condition.     Tanna Furry, MD 03/12/16 2101

## 2016-03-04 NOTE — ED Notes (Signed)
Admitting MD at bedside.

## 2016-03-05 ENCOUNTER — Telehealth: Payer: Self-pay | Admitting: Endocrinology

## 2016-03-05 DIAGNOSIS — J101 Influenza due to other identified influenza virus with other respiratory manifestations: Secondary | ICD-10-CM | POA: Diagnosis present

## 2016-03-05 DIAGNOSIS — I10 Essential (primary) hypertension: Secondary | ICD-10-CM | POA: Diagnosis present

## 2016-03-05 DIAGNOSIS — E1310 Other specified diabetes mellitus with ketoacidosis without coma: Secondary | ICD-10-CM | POA: Diagnosis not present

## 2016-03-05 LAB — BASIC METABOLIC PANEL
ANION GAP: 7 (ref 5–15)
Anion gap: 9 (ref 5–15)
Anion gap: 8 (ref 5–15)
BUN: 28 mg/dL — ABNORMAL HIGH (ref 6–20)
BUN: 27 mg/dL — AB (ref 6–20)
BUN: 23 mg/dL — AB (ref 6–20)
CALCIUM: 8.8 mg/dL — AB (ref 8.9–10.3)
CHLORIDE: 111 mmol/L (ref 101–111)
CO2: 21 mmol/L — ABNORMAL LOW (ref 22–32)
CO2: 21 mmol/L — AB (ref 22–32)
CO2: 20 mmol/L — ABNORMAL LOW (ref 22–32)
CREATININE: 1.04 mg/dL (ref 0.61–1.24)
Calcium: 8.8 mg/dL — ABNORMAL LOW (ref 8.9–10.3)
Calcium: 8.7 mg/dL — ABNORMAL LOW (ref 8.9–10.3)
Chloride: 111 mmol/L (ref 101–111)
Chloride: 112 mmol/L — ABNORMAL HIGH (ref 101–111)
Creatinine, Ser: 1.09 mg/dL (ref 0.61–1.24)
Creatinine, Ser: 0.97 mg/dL (ref 0.61–1.24)
GFR calc Af Amer: >60 mL/min (ref >60)
GFR calc non Af Amer: >60 mL/min (ref >60)
GFR calc non Af Amer: >60 mL/min (ref >60)
Glucose, Bld: 121 mg/dL — ABNORMAL HIGH (ref 65–99)
Glucose, Bld: 107 mg/dL — ABNORMAL HIGH (ref 65–99)
Glucose, Bld: 132 mg/dL — ABNORMAL HIGH (ref 65–99)
POTASSIUM: 3.8 mmol/L (ref 3.5–5.1)
POTASSIUM: 4.2 mmol/L (ref 3.5–5.1)
Potassium: 4.7 mmol/L (ref 3.5–5.1)
SODIUM: 140 mmol/L (ref 135–145)
SODIUM: 139 mmol/L (ref 135–145)
SODIUM: 141 mmol/L (ref 135–145)

## 2016-03-05 LAB — CBC WITH DIFFERENTIAL/PLATELET
BASOS PCT: 0 %
Basophils Absolute: 0 10*3/uL (ref 0.0–0.1)
EOS ABS: 0.1 10*3/uL (ref 0.0–0.7)
EOS PCT: 1 %
HCT: 37.8 % — ABNORMAL LOW (ref 39.0–52.0)
Hemoglobin: 13.2 g/dL (ref 13.0–17.0)
Lymphocytes Relative: 21 %
Lymphs Abs: 1.9 10*3/uL (ref 0.7–4.0)
MCH: 29.3 pg (ref 26.0–34.0)
MCHC: 34.9 g/dL (ref 30.0–36.0)
MCV: 83.8 fL (ref 78.0–100.0)
MONOS PCT: 14 %
Monocytes Absolute: 1.3 10*3/uL — ABNORMAL HIGH (ref 0.1–1.0)
NEUTROS PCT: 64 %
Neutro Abs: 5.8 10*3/uL (ref 1.7–7.7)
PLATELETS: 171 10*3/uL (ref 150–400)
RBC: 4.51 MIL/uL (ref 4.22–5.81)
RDW: 13.7 % (ref 11.5–15.5)
WBC: 9.1 10*3/uL (ref 4.0–10.5)

## 2016-03-05 LAB — GLUCOSE, RANDOM: Glucose, Bld: 293 mg/dL — ABNORMAL HIGH (ref 65–99)

## 2016-03-05 LAB — INFLUENZA PANEL BY PCR (TYPE A & B)
H1N1 flu by pcr: NOT DETECTED
Influenza A By PCR: POSITIVE — AB
Influenza B By PCR: NEGATIVE

## 2016-03-05 LAB — GLUCOSE, CAPILLARY
GLUCOSE-CAPILLARY: 114 mg/dL — AB (ref 65–99)
GLUCOSE-CAPILLARY: 117 mg/dL — AB (ref 65–99)
GLUCOSE-CAPILLARY: 118 mg/dL — AB (ref 65–99)
GLUCOSE-CAPILLARY: 144 mg/dL — AB (ref 65–99)
GLUCOSE-CAPILLARY: 164 mg/dL — AB (ref 65–99)
GLUCOSE-CAPILLARY: 180 mg/dL — AB (ref 65–99)
GLUCOSE-CAPILLARY: 214 mg/dL — AB (ref 65–99)
GLUCOSE-CAPILLARY: 241 mg/dL — AB (ref 65–99)
GLUCOSE-CAPILLARY: 92 mg/dL (ref 65–99)
Glucose-Capillary: 116 mg/dL — ABNORMAL HIGH (ref 65–99)
Glucose-Capillary: 140 mg/dL — ABNORMAL HIGH (ref 65–99)
Glucose-Capillary: 131 mg/dL — ABNORMAL HIGH (ref 65–99)
Glucose-Capillary: 160 mg/dL — ABNORMAL HIGH (ref 65–99)
Glucose-Capillary: 185 mg/dL — ABNORMAL HIGH (ref 65–99)
Glucose-Capillary: 419 mg/dL — ABNORMAL HIGH (ref 65–99)

## 2016-03-05 MED ORDER — INSULIN ASPART 100 UNIT/ML ~~LOC~~ SOLN: 0.0000 [IU] | Freq: Every day | SUBCUTANEOUS | Status: DC

## 2016-03-05 MED ORDER — INSULIN ASPART 100 UNIT/ML ~~LOC~~ SOLN
0.0000 [IU] | Freq: Three times a day (TID) | SUBCUTANEOUS | Status: DC
  Administered 2016-03-05: 15 [IU] via SUBCUTANEOUS
  Administered 2016-03-05 – 2016-03-06 (×3): 3 [IU] via SUBCUTANEOUS
  Administered 2016-03-06: 2 [IU] via SUBCUTANEOUS
  Administered 2016-03-07: 3 [IU] via SUBCUTANEOUS
  Administered 2016-03-07: 2 [IU] via SUBCUTANEOUS

## 2016-03-05 MED ORDER — INSULIN DETEMIR 100 UNIT/ML ~~LOC~~ SOLN
20.0000 [IU] | Freq: Two times a day (BID) | SUBCUTANEOUS | Status: DC
  Administered 2016-03-05 – 2016-03-07 (×4): 20 [IU] via SUBCUTANEOUS
  Filled 2016-03-05 (×5): qty 0.2

## 2016-03-05 MED ORDER — OSELTAMIVIR PHOSPHATE 75 MG PO CAPS
75.0000 mg | ORAL_CAPSULE | Freq: Two times a day (BID) | ORAL | Status: DC
  Administered 2016-03-05 – 2016-03-07 (×5): 75 mg via ORAL
  Filled 2016-03-05 (×6): qty 1

## 2016-03-05 MED ORDER — PANTOPRAZOLE SODIUM 40 MG PO TBEC
40.0000 mg | DELAYED_RELEASE_TABLET | Freq: Every day | ORAL | Status: DC
  Administered 2016-03-05 – 2016-03-07 (×3): 40 mg via ORAL
  Filled 2016-03-05 (×3): qty 1

## 2016-03-05 MED ORDER — SODIUM CHLORIDE 0.9 % IV SOLN
INTRAVENOUS | Status: AC
  Administered 2016-03-05: 12:00:00 via INTRAVENOUS
  Administered 2016-03-06: 1000 mL via INTRAVENOUS

## 2016-03-05 MED ORDER — INSULIN DETEMIR 100 UNIT/ML ~~LOC~~ SOLN
15.0000 [IU] | Freq: Every day | SUBCUTANEOUS | Status: DC
  Administered 2016-03-05: 15 [IU] via SUBCUTANEOUS
  Filled 2016-03-05: qty 0.15

## 2016-03-05 MED ORDER — LEVOFLOXACIN IN D5W 750 MG/150ML IV SOLN
750.0000 mg | INTRAVENOUS | Status: DC
  Administered 2016-03-05 – 2016-03-06 (×2): 750 mg via INTRAVENOUS
  Filled 2016-03-05 (×2): qty 150

## 2016-03-05 MED ORDER — GUAIFENESIN ER 600 MG PO TB12
1200.0000 mg | ORAL_TABLET | Freq: Two times a day (BID) | ORAL | Status: DC
  Administered 2016-03-05 – 2016-03-07 (×5): 1200 mg via ORAL
  Filled 2016-03-05 (×5): qty 2

## 2016-03-05 NOTE — Telephone Encounter (Signed)
Andre Stout, Could you contact the pt and reschedule the appointment for 2 weeks? Thanks!

## 2016-03-05 NOTE — Progress Notes (Signed)
[facility] PROGRESS NOTE  [name] [id number] DOB: 1940-05-20 DOA: 03/04/2016 PCP: Mauricio Po, FNP  Summary 03/05/16: I have seen and examined Mr Andre Stout at bedside and reviewed his chart. Andre Stout is a 76 y.o. male who lives alone, has poorly controlled type 2 diabetes mellitus(HbA1C 11.8 last month) on insulin/essential hypertension who presented to the ED with complaints of weakness, increased thirst and urination for the past 2-3 days, this apparently preceded by him drinking a lot of Diet Coke, and eating cake the past day. He was found to have a glucose level of 795 with an Anion Gap of 29 in the ED. He was placed on the DKA protocol and admitted to the ICU. Patient also noted to have history of three-day illness manifested by fever up to 100F, nausea, several episodes of emesis, coughing, less active and weak for 2 days at home. Patient is out of DKA, seems to have precipitated by ongoing acute bronchitis in setting of poorly controlled diabetes- ?Bacterial/viral. Will transition patient to Levemir/SSI, institute droplet precautions, obtain influenza PCR, give Levaquin/Tamiflu, consult physical therapy/case management, and transfer to [place] when bed becomes available. Patient tells me he takes 100 units of Levemir but I think this is too much. We will therefore start with low-dose Levemir and gradually escalate dose to optimize control. It does not seem like patient is coping at home therefore we should look into home health services as possible.  Plan Diabetic ketoacidosis without coma associated with type 2 diabetes mellitus (HCC)/Type II diabetes mellitus ([place])  Discontinue insulin drip  Levemir/SSI  Continue IV fluids  Carb Modified diet AKI (acute kidney injury) ([place])  Related to ongoing infection/DKA  Should improve with hydration Acute bronchitis  Droplet precautions  Influenza PCR  Levaquin/Tamiflu Benign essential HTN  Continue  enalapril  Code Status: Full Code Family Communication: Patient at bedside Disposition Plan: ?Home eventually. Transfer to [place] when bed becomes available. Consult physical therapy/case management to look into home health services.   Consultants:    Procedures:    Antibiotics:  Levaquin 03/05/16>>  Tamiflu 03/05/16>>  HPI/Subjective: Feels okay denies any complaints.  Objective: Filed Vitals:   03/05/16 0600 03/05/16 0800  BP: 122/51 119/54  Pulse: 61 60  Temp:  97.4 F (36.3 C)  Resp: 21 22    Intake/Output Summary (Last 24 hours) at 03/05/16 0901 Last data filed at 03/05/16 0800  Gross per 24 hour  Intake 3646.05 ml  Output    200 ml  Net 3446.05 ml   Filed Weights   03/04/16 1145  Weight: 72.576 kg (160 lb)    Exam:   General:  Comfortable at rest. Coughing.  Cardiovascular: S1-S2 normal. No murmurs. Pulse regular.  Respiratory: Good air entry bilaterally. No rhonchi or rales.  Abdomen: Soft and nontender. Normal bowel sounds. No organomegaly.  Musculoskeletal: No pedal edema   Neurological: Intact  Data Reviewed: Basic Metabolic Panel:  Recent Labs Lab 03/04/16 1149 03/04/16 1417 03/04/16 1704 03/04/16 2004 03/05/16 0034 03/05/16 0316  NA 130* 133* 136 139 141 140  K 5.7* 4.6 4.9 4.3 4.2 4.7  CL 92* 102 102 108 111 111  CO2 9*  --  13* 18* 21* 21*  GLUCOSE 795* 652* 424* 210* 121* 107*  BUN 39* 33* 35* 30* 28* 27*  CREATININE 1.85* 1.20 1.71* 1.29* 1.09 1.04  CALCIUM 9.1  --  9.2 8.9 8.8* 8.8*   Liver Function Tests:  Recent Labs Lab 03/04/16 1149  AST 20  ALT 18  ALKPHOS 86  BILITOT 2.4*  PROT 6.8  ALBUMIN 3.2*    Recent Labs Lab 03/04/16 1149  LIPASE 22   No results for input(s): AMMONIA in the last 168 hours. CBC:  Recent Labs Lab 03/04/16 1044 03/04/16 1417  WBC 12.4*  --   NEUTROABS 9.8*  --   HGB 15.7 16.3  HCT 47.8 48.0  MCV 88.0  --   PLT 216  --    Cardiac Enzymes: No results for input(s):  CKTOTAL, CKMB, CKMBINDEX, TROPONINI in the last 168 hours. BNP (last 3 results) No results for input(s): BNP in the last 8760 hours.  ProBNP (last 3 results) No results for input(s): PROBNP in the last 8760 hours.  CBG:  Recent Labs Lab 03/05/16 0256 03/05/16 0415 03/05/16 0538 03/05/16 0651 03/05/16 0803  GLUCAP 118* 117* 241* 140* 144*    Recent Results (from the past 240 hour(s))  MRSA PCR Screening     Status: None   Collection Time: 03/04/16  4:07 PM  Result Value Ref Range Status   MRSA by PCR NEGATIVE NEGATIVE Final    Comment:        The GeneXpert MRSA Assay (FDA approved for NASAL specimens only), is one component of a comprehensive MRSA colonization surveillance program. It is not intended to diagnose MRSA infection nor to guide or monitor treatment for MRSA infections.      Studies: Dg Chest 2 View  03/04/2016  CLINICAL DATA:  Fever, cough, fatigue, and vomiting for 2 days, former smoker, diabetes mellitus, prostate cancer EXAM: CHEST  2 VIEW COMPARISON:  02/17/2010 FINDINGS: Normal heart size, mediastinal contours, and pulmonary vascularity. Atherosclerotic calcification aorta. Lungs minimally hyperinflated but clear. No pulmonary infiltrate, pleural effusion or pneumothorax. No acute osseous findings. IMPRESSION: No acute abnormalities. Electronically Signed   By: Lavonia Dana M.D.   On: 03/04/2016 11:08    Scheduled Meds: . brimonidine  1 drop Both Eyes BID  . brimonidine  1 drop Both Eyes BID   And  . timolol  1 drop Both Eyes BID  . enalapril  5 mg Oral BID  . enoxaparin (LOVENOX) injection  40 mg Subcutaneous Q24H  . guaiFENesin  1,200 mg Oral BID  . insulin aspart  0-15 Units Subcutaneous TID WC  . insulin aspart  0-5 Units Subcutaneous QHS  . insulin detemir  15 Units Subcutaneous Daily  . levofloxacin (LEVAQUIN) IV  750 mg Intravenous Q24H  . pantoprazole  40 mg Oral Q1200   Continuous Infusions: . sodium chloride       Time spent: 25  minutes    Andre Stout  [facility] Pager [phone]. If 7PM-7AM, please contact night-coverage at [email or web address], password [facility] 03/05/2016, 9:01 AM  LOS: 1 day

## 2016-03-05 NOTE — Telephone Encounter (Signed)
Please come back for a follow-up appointment in 2 weeks 

## 2016-03-05 NOTE — Telephone Encounter (Signed)
Patient no showed today's appt. Please advise on how to follow up. °A. No follow up necessary. °B. Follow up urgent. Contact patient immediately. °C. Follow up necessary. Contact patient and schedule visit in ___ days. °D. Follow up advised. Contact patient and schedule visit in ____weeks. ° °

## 2016-03-05 NOTE — Care Management Note (Signed)
Case Management Note  Patient Details  Name: JAMESRYAN KRAMMER MRN: [id number] Date of Birth: January 16, 1940  Subjective/Objective:                 dka   Action/Plan:[id number]/Aiyana Stegmann Rosana Hoes, BSN, RN, CCM: [phone] Case management. Chart reviewed for discharge planning and present needs. Discharge needs: none present at time of review.   Expected Discharge Date:                  Expected Discharge Plan:  Home/Self Care  In-House Referral:  NA  Discharge planning Services  CM Consult  Post Acute Care Choice:    Choice offered to:     DME Arranged:    DME Agency:     HH Arranged:    HH Agency:     Status of Service:  In process, will continue to follow  Medicare Important Message Given:    Date Medicare IM Given:    Medicare IM give by:    Date Additional Medicare IM Given:    Additional Medicare Important Message give by:     If discussed at [place] of Stay Meetings, dates discussed:    Additional Comments:  Leeroy Cha, RN 03/05/2016, 12:08 PM

## 2016-03-06 DIAGNOSIS — J101 Influenza due to other identified influenza virus with other respiratory manifestations: Secondary | ICD-10-CM | POA: Diagnosis not present

## 2016-03-06 DIAGNOSIS — R790 Abnormal level of blood mineral: Secondary | ICD-10-CM | POA: Diagnosis present

## 2016-03-06 LAB — CBC WITH DIFFERENTIAL/PLATELET
Basophils Absolute: 0 10*3/uL (ref 0.0–0.1)
Basophils Relative: 0 %
Eosinophils Absolute: 0 10*3/uL (ref 0.0–0.7)
Eosinophils Relative: 0 %
HCT: 37.2 % — ABNORMAL LOW (ref 39.0–52.0)
Hemoglobin: 12.4 g/dL — ABNORMAL LOW (ref 13.0–17.0)
Lymphocytes Relative: 32 %
Lymphs Abs: 2.1 10*3/uL (ref 0.7–4.0)
MCH: 28.1 pg (ref 26.0–34.0)
MCHC: 33.3 g/dL (ref 30.0–36.0)
MCV: 84.4 fL (ref 78.0–100.0)
Monocytes Absolute: 0.7 10*3/uL (ref 0.1–1.0)
Monocytes Relative: 10 %
Neutro Abs: 3.8 10*3/uL (ref 1.7–7.7)
Neutrophils Relative %: 58 %
Platelets: 198 10*3/uL (ref 150–400)
RBC: 4.41 MIL/uL (ref 4.22–5.81)
RDW: 14 % (ref 11.5–15.5)
WBC: 6.6 10*3/uL (ref 4.0–10.5)

## 2016-03-06 LAB — COMPREHENSIVE METABOLIC PANEL
ALBUMIN: 2.5 g/dL — AB (ref 3.5–5.0)
ALK PHOS: 62 U/L (ref 38–126)
ALT: 14 U/L — AB (ref 17–63)
AST: 18 U/L (ref 15–41)
Anion gap: 8 (ref 5–15)
BILIRUBIN TOTAL: 0.9 mg/dL (ref 0.3–1.2)
BUN: 15 mg/dL (ref 6–20)
CO2: 21 mmol/L — AB (ref 22–32)
CREATININE: 0.9 mg/dL (ref 0.61–1.24)
Calcium: 8.9 mg/dL (ref 8.9–10.3)
Chloride: 109 mmol/L (ref 101–111)
GFR calc Af Amer: >60 mL/min (ref >60)
GFR calc non Af Amer: >60 mL/min (ref >60)
GLUCOSE: 225 mg/dL — AB (ref 65–99)
Potassium: 3.7 mmol/L (ref 3.5–5.1)
SODIUM: 138 mmol/L (ref 135–145)
TOTAL PROTEIN: 5.8 g/dL — AB (ref 6.5–8.1)

## 2016-03-06 LAB — GLUCOSE, CAPILLARY
GLUCOSE-CAPILLARY: 152 mg/dL — AB (ref 65–99)
GLUCOSE-CAPILLARY: 133 mg/dL — AB (ref 65–99)
Glucose-Capillary: 166 mg/dL — ABNORMAL HIGH (ref 65–99)
Glucose-Capillary: 134 mg/dL — ABNORMAL HIGH (ref 65–99)

## 2016-03-06 LAB — TSH: TSH: 0.975 u[IU]/mL (ref 0.350–4.500)

## 2016-03-06 LAB — PHOSPHORUS: Phosphorus: 1.7 mg/dL — ABNORMAL LOW (ref 2.5–4.6)

## 2016-03-06 LAB — MAGNESIUM: Magnesium: 1.2 mg/dL — ABNORMAL LOW (ref 1.7–2.4)

## 2016-03-06 MED ORDER — POTASSIUM PHOSPHATES 15 MMOLE/5ML IV SOLN
20.0000 mmol | Freq: Once | INTRAVENOUS | Status: DC
  Filled 2016-03-06: qty 6.67

## 2016-03-06 MED ORDER — MAGNESIUM SULFATE 2 GM/50ML IV SOLN
2.0000 g | Freq: Once | INTRAVENOUS | Status: AC
  Administered 2016-03-06: 2 g via INTRAVENOUS
  Filled 2016-03-06: qty 50

## 2016-03-06 NOTE — Progress Notes (Signed)
Md ordered to d/c cardiac monitoring for patient.

## 2016-03-06 NOTE — Progress Notes (Signed)
[facility] PROGRESS NOTE  AMEAR FRANTA [id number] DOB: 1940-09-10 DOA: 03/04/2016 PCP: Mauricio Po, FNP  Summary Andre Stout is a pleasant 76 y.o. male who lives alone, has poorly controlled type 2 diabetes mellitus(HbA1C 11.8 last month) on insulin/essential hypertension who presented to the ED with complaints of weakness, increased thirst and urination for 2-3 days prior to admission, this apparently preceded by him drinking a lot of Diet Coke, and eating cake the past day. He was found to have a glucose level of 795 with an Anion Gap of 29 in the ED. He was placed on the DKA protocol and admitted to the ICU. Patient was also noted to have history of three-day illness manifested by fever up to 100F, nausea, several episodes of emesis, coughing, less active and weak for 2 days at home, these symptoms now evidently caused by influenza type A. patient is now out of DKA and is now on Levemir/SSI. He was started on Levaquin/Tamiflu on 03/05/2016 and this will be narrowed to Tamiflu only as chest x-ray does not suggest pneumonia and UA is unrevealing. He will likely discharge home with home health services in the next 24-48 hours if he continues to show improvement. Patient states that he feels 200% better. Plan Diabetic ketoacidosis without coma associated with type 2 diabetes mellitus (HCC)/Type II diabetes mellitus (HCC)  Levemir/SSI  Continue IV fluids  Carb Modified diet AKI (acute kidney injury) ([place])  Related to ongoing infection/DKA  Improved with hydration Influenza type A  Droplet precautions  Discontinue Levaquin  Day 2/5 Tamiflu Benign essential HTN  Continue Enalapril  Code Status: Full Code Family Communication: Patient at bedside Disposition Plan: ?Home in the next 24-48 hours if continues to do well.   Consultants:  None  Procedures:  None  Antibiotics:  Levaquin 03/05/16>>03/06/16  Tamiflu 03/05/16>>  HPI/Subjective: Feels much  better.  Objective: Filed Vitals:   03/06/16 0922 03/06/16 1500  BP: 142/75 140/68  Pulse:  58  Temp:  99.2 F (37.3 C)  Resp:  20    Intake/Output Summary (Last 24 hours) at 03/06/16 2103 Last data filed at 03/06/16 1430  Gross per 24 hour  Intake 1373.17 ml  Output      0 ml  Net 1373.17 ml   Filed Weights   03/04/16 1145 03/05/16 1611  Weight: 72.576 kg (160 lb) 72.576 kg (160 lb)    Exam:   General:  Comfortable at rest.  Cardiovascular: S1-S2 normal. No murmurs. Pulse regular.  Respiratory: Good air entry bilaterally. No rhonchi or rales.  Abdomen: Soft and nontender. Normal bowel sounds. No organomegaly.  Musculoskeletal: No pedal edema   Neurological: Intact  Data Reviewed: Basic Metabolic Panel:  Recent Labs Lab 03/04/16 2004 03/05/16 0034 03/05/16 0316 03/05/16 0824 03/05/16 1855 03/06/16 0329  NA 139 141 140 139  --  138  K 4.3 4.2 4.7 3.8  --  3.7  CL 108 111 111 112*  --  109  CO2 18* 21* 21* 20*  --  21*  GLUCOSE 210* 121* 107* 132* 293* 225*  BUN 30* 28* 27* 23*  --  15  CREATININE 1.29* 1.09 1.04 0.97  --  0.90  CALCIUM 8.9 8.8* 8.8* 8.7*  --  8.9  MG  --   --   --   --   --  1.2*  PHOS  --   --   --   --   --  1.7*   Liver Function Tests:  Recent Labs  Lab 03/04/16 1149 03/06/16 0329  AST 20 18  ALT 18 14*  ALKPHOS 86 62  BILITOT 2.4* 0.9  PROT 6.8 5.8*  ALBUMIN 3.2* 2.5*    Recent Labs Lab 03/04/16 1149  LIPASE 22   No results for input(s): AMMONIA in the last 168 hours. CBC:  Recent Labs Lab 03/04/16 1044 03/04/16 1417 03/05/16 0824 03/06/16 0329  WBC 12.4*  --  9.1 6.6  NEUTROABS 9.8*  --  5.8 3.8  HGB 15.7 16.3 13.2 12.4*  HCT 47.8 48.0 37.8* 37.2*  MCV 88.0  --  83.8 84.4  PLT 216  --  171 198   Cardiac Enzymes: No results for input(s): CKTOTAL, CKMB, CKMBINDEX, TROPONINI in the last 168 hours. BNP (last 3 results) No results for input(s): BNP in the last 8760 hours.  ProBNP (last 3 results) No  results for input(s): PROBNP in the last 8760 hours.  CBG:  Recent Labs Lab 03/05/16 1748 03/05/16 2219 03/06/16 0748 03/06/16 1153 03/06/16 1708  GLUCAP 419* 164* 166* 134* 152*    Recent Results (from the past 240 hour(s))  MRSA PCR Screening     Status: None   Collection Time: 03/04/16  4:07 PM  Result Value Ref Range Status   MRSA by PCR NEGATIVE NEGATIVE Final    Comment:        The GeneXpert MRSA Assay (FDA approved for NASAL specimens only), is one component of a comprehensive MRSA colonization surveillance program. It is not intended to diagnose MRSA infection nor to guide or monitor treatment for MRSA infections.      Studies: No results found.  Scheduled Meds: . brimonidine  1 drop Both Eyes BID  . brimonidine  1 drop Both Eyes BID   And  . timolol  1 drop Both Eyes BID  . enalapril  5 mg Oral BID  . enoxaparin (LOVENOX) injection  40 mg Subcutaneous Q24H  . guaiFENesin  1,200 mg Oral BID  . insulin aspart  0-15 Units Subcutaneous TID WC  . insulin aspart  0-5 Units Subcutaneous QHS  . insulin detemir  20 Units Subcutaneous BID  . levofloxacin (LEVAQUIN) IV  750 mg Intravenous Q24H  . oseltamivir  75 mg Oral BID  . pantoprazole  40 mg Oral Q1200  . potassium phosphate IVPB (mmol)  20 mmol Intravenous Once   Continuous Infusions: . sodium chloride 75 mL/hr at 03/06/16 1733     Time spent: 25 minutes    Cristoval Teall  [facility] Pager [phone]. If 7PM-7AM, please contact night-coverage at [email or web address], password [facility] 03/06/2016, 9:03 PM  LOS: 2 days

## 2016-03-06 NOTE — Evaluation (Signed)
Physical Therapy Evaluation Patient Details Name: Andre Stout MRN: [id number] DOB: 07/16/1940 Today's Date: 03/06/2016   History of Present Illness  67 yoo male admitted 3/ 5/17 with increased  thirst and urination. positive for flu, and DKA with increased glucose.  Clinical Impression  The patient is pleasant , demonstrates mild balance losses with change in direction. Patient  Will benefit from PT while in acute care to improve in balance to Return to modified independent level. Recommend initially having 24/7 caregivers.    Follow Up Recommendations Home health PT;Supervision/Assistance - 24 hour (safety eval)    Equipment Recommendations  Rolling walker with 5" wheels    Recommendations for Other Services       Precautions / Restrictions Precautions Precautions: Fall Restrictions Weight Bearing Restrictions: No      Mobility  Bed Mobility Overal bed mobility: Independent                Transfers Overall transfer level: Needs assistance Equipment used: 1 person hand held assist Transfers: Sit to/from Stand Sit to Stand: Min assist         General transfer comment: steady assist to stand, wide base, wobbly when taking first steps holding onto IV pole  Ambulation/Gait Ambulation/Gait assistance: Min assist;Mod assist Ambulation Distance (Feet): 75 Feet   Gait Pattern/deviations: Step-through pattern;Staggering right;Staggering left     General Gait Details: 100' without AD and noted balance loss especially with turning around at end of hall. gave patient a RW and then he ambulated another 100' with improved gait and balance.  Stairs            Wheelchair Mobility    Modified Rankin (Stroke Patients Only)       Balance Overall balance assessment: Needs assistance         Standing balance support: During functional activity;No upper extremity supported Standing balance-Leahy Scale: Poor                                Pertinent Vitals/Pain Pain Assessment: Faces Faces Pain Scale: Hurts a little bit Pain Location: all over Pain Descriptors / Indicators: Tightness    Home Living Family/patient expects to be discharged to:: Private residence Living Arrangements: Alone Available Help at Discharge: Family;Available PRN/intermittently Type of Home: House Home Access: Stairs to enter Entrance Stairs-Rails: None Entrance Stairs-Number of Steps: 3 Home Layout: One level Home Equipment: None      Prior Function Level of Independence: Independent               Hand Dominance        Extremity/Trunk Assessment   Upper Extremity Assessment: Generalized weakness           Lower Extremity Assessment: Generalized weakness      Cervical / Trunk Assessment: Normal  Communication   Communication: No difficulties  Cognition Arousal/Alertness: Awake/alert Behavior During Therapy: WFL for tasks assessed/performed Overall Cognitive Status: Within Functional Limits for tasks assessed                      General Comments      Exercises        Assessment/Plan    PT Assessment Patient needs continued PT services  PT Diagnosis Difficulty walking;Abnormality of gait;Generalized weakness   PT Problem List Decreased strength;Decreased balance;Decreased mobility  PT Treatment Interventions DME instruction;Gait training;Stair training;Functional mobility training;Therapeutic activities;Therapeutic exercise;Patient/family education   PT Goals (Current goals can  be found in the Care Plan section) Acute Rehab PT Goals Patient Stated Goal: to go home, work on cars PT Goal Formulation: With patient Time For Goal Achievement: 03/20/16 Potential to Achieve Goals: Good    Frequency Min 3X/week   Barriers to discharge Decreased caregiver support      Co-evaluation               End of Session Equipment Utilized During Treatment: Gait belt Activity Tolerance: Patient  tolerated treatment well Patient left: in bed;with call bell/phone within reach;with bed alarm set Nurse Communication: Mobility status         Time: [id number] PT Time Calculation (min) (ACUTE ONLY): 17 min   Charges:   PT Evaluation $PT Eval Low Complexity: 1 Procedure     PT G CodesClaretha Cooper 03/06/2016, 11:28 AM Tresa Endo PT [phone]

## 2016-03-06 NOTE — Care Management Obs Status (Signed)
[place] NOTIFICATION   Patient Details  Name: Andre Stout MRN: [id number] Date of Birth: December 06, 1940   Medicare Observation Status Notification Given:  Yes    Lynnell Catalan, RN 03/06/2016, 12:25 PM

## 2016-03-06 NOTE — Care Management Note (Signed)
Case Management Note  Patient Details  Name: Andre Stout MRN: [id number] Date of Birth: 1940/10/23  Subjective/Objective:       76 yo admitted with Diabetic Ketoacidosis             Action/Plan: From home alone and has support from daughter  Expected Discharge Date:                  Expected Discharge Plan:  Home/Self Care  In-House Referral:  NA  Discharge planning Services  CM Consult  Post Acute Care Choice:  Home Health Choice offered to:     DME Arranged:    DME Agency:     HH Arranged:    [place] Agency:     Status of Service:  In process, will continue to follow  Medicare Important Message Given:    Date Medicare IM Given:    Medicare IM give by:    Date Additional Medicare IM Given:    Additional Medicare Important Message give by:     If discussed at [place] of Stay Meetings, dates discussed:    Additional Comments: This CM met with pt at bedside to discuss disposition planning.  PT recommendations reviewed with pt.  Pt politely declines home health services at this time and states he has a daughter that can help him at home.  He states he has a walker and wheelchair at home and does not think he needs any additional equipment.  CM will continue to follow. Lynnell Catalan, RN 03/06/2016, 12:27 PM

## 2016-03-07 DIAGNOSIS — N179 Acute kidney failure, unspecified: Secondary | ICD-10-CM | POA: Diagnosis not present

## 2016-03-07 DIAGNOSIS — J101 Influenza due to other identified influenza virus with other respiratory manifestations: Secondary | ICD-10-CM | POA: Diagnosis not present

## 2016-03-07 DIAGNOSIS — I10 Essential (primary) hypertension: Secondary | ICD-10-CM | POA: Diagnosis not present

## 2016-03-07 DIAGNOSIS — E1310 Other specified diabetes mellitus with ketoacidosis without coma: Secondary | ICD-10-CM

## 2016-03-07 LAB — BASIC METABOLIC PANEL
Anion gap: 11 (ref 5–15)
BUN: 12 mg/dL (ref 6–20)
CO2: 22 mmol/L (ref 22–32)
CREATININE: 0.86 mg/dL (ref 0.61–1.24)
Calcium: 8.9 mg/dL (ref 8.9–10.3)
Chloride: 107 mmol/L (ref 101–111)
GFR calc Af Amer: >60 mL/min (ref >60)
GLUCOSE: 184 mg/dL — AB (ref 65–99)
Potassium: 3.7 mmol/L (ref 3.5–5.1)
SODIUM: 140 mmol/L (ref 135–145)

## 2016-03-07 LAB — GLUCOSE, CAPILLARY
GLUCOSE-CAPILLARY: 126 mg/dL — AB (ref 65–99)
GLUCOSE-CAPILLARY: 157 mg/dL — AB (ref 65–99)
GLUCOSE-CAPILLARY: 164 mg/dL — AB (ref 65–99)

## 2016-03-07 LAB — CBC
HCT: 37.7 % — ABNORMAL LOW (ref 39.0–52.0)
Hemoglobin: 12.6 g/dL — ABNORMAL LOW (ref 13.0–17.0)
MCH: 27.9 pg (ref 26.0–34.0)
MCHC: 33.4 g/dL (ref 30.0–36.0)
MCV: 83.4 fL (ref 78.0–100.0)
PLATELETS: 188 10*3/uL (ref 150–400)
RBC: 4.52 MIL/uL (ref 4.22–5.81)
RDW: 13.9 % (ref 11.5–15.5)
WBC: 5.8 10*3/uL (ref 4.0–10.5)

## 2016-03-07 MED ORDER — OSELTAMIVIR PHOSPHATE 75 MG PO CAPS: 75.0000 mg | ORAL_CAPSULE | Freq: Two times a day (BID) | ORAL | Status: DC

## 2016-03-07 MED ORDER — INSULIN LISPRO 100 UNIT/ML ~~LOC~~ SOLN: 0.0000 [IU] | Freq: Three times a day (TID) | SUBCUTANEOUS | Status: DC

## 2016-03-07 MED ORDER — INSULIN ASPART 100 UNIT/ML ~~LOC~~ SOLN
0.0000 [IU] | Freq: Three times a day (TID) | SUBCUTANEOUS | Status: DC
  Administered 2016-03-07: 2 [IU] via SUBCUTANEOUS

## 2016-03-07 MED ORDER — INSULIN DETEMIR 100 UNIT/ML ~~LOC~~ SOLN: 40.0000 [IU] | SUBCUTANEOUS | Status: DC

## 2016-03-07 NOTE — Discharge Instructions (Signed)

## 2016-03-07 NOTE — Discharge Summary (Addendum)
Physician Discharge Summary  Andre Stout  [id number]  DOB: Sep 09, 1940  DOA: 03/04/2016  PCP: Mauricio Po, FNP  Admit date: 03/04/2016 Discharge date: 03/07/2016  Time spent: Greater than 30 minutes  Recommendations for Outpatient Follow-up:  1. Mauricio Po, FNP/PCP in 5 days with repeat labs (CBC, BMP, Mg & Phos). 2. Dr. Renato Shin, endocrinology in 2 weeks 3. Home health PT and rolling walker with 5 inch wheels.  Discharge Diagnoses:  Principal Problem:   Influenza A Active Problems:   Type II diabetes mellitus ([place])   DKA (diabetic ketoacidoses) ([place])   Diabetic ketoacidosis without coma associated with type 2 diabetes mellitus ([place])   AKI (acute kidney injury) ([place])   Benign essential HTN   Hypophosphatemia   Hypomagnesemia   DKA, type 2, not at goal [facility])   Discharge Condition: Improved & Stable  Diet recommendation: Heart healthy and diabetic diet.  Filed Weights   03/04/16 1145 03/05/16 1611  Weight: 72.576 kg (160 lb) 72.576 kg (160 lb)    History of present illness:  76 year old male, lives alone, history of poorly controlled DM 2 (A1c 11.8 last month) on Levemir alone, essential HTN, prostate cancer presented to ED with complaints of weakness, increased thirst and urination for the past 2-3 days. According to family, he had been drinking a lot of Dr. Malachi Bonds and eating cake the past day. He was found to have blood glucose of 795 and an anion gap of 29 in the ED. He was admitted for diabetic ketoacidosis.  Hospital Course:   DKA in type II DM, not at goal - Treated per DKA protocol with aggressive IV fluids, insulin drip and close monitoring of electrolytes. - After DKA resolved, patient was transitioned to Levemir and NovoLog SSI. - Patient has required much lesser than home dose of Levemir and his CBGs are reasonably controlled as below. Plan to discharge him on reduced dose of Levemir and Humalog SSI with close outpatient follow-up with his PCP and  endocrinologist. - He was initially managed in the stepdown unit and then transferred to medical floor. - ? Compliance issues with diet and medications.  Acute kidney injury - Resolved after IV fluids  Influenza A with upper respiratory manifestations/acute bronchitis - Patient noted to have three-day illness manifested by fever up to 100F, nausea, nonbloody emesis, coughing, less active and weak for 2 days prior to admission. - Influenza A positive by PCR. - Started on Tamiflu. Empirically started levofloxacin was discontinued due to low index of suspicion for bacterial infection. - Clinically improved and asymptomatic. Complete 5 days course of Tamiflu. - His influenza may have precipitated DKA.  Essential hypertension - Controlled on enalapril.  Hypomagnesemia/hypophosphatemia - Replaced IV on 3/7. Follow outpatient.     Consultations:  None  Procedures:  None    Discharge Exam:  Complaints:  Denies complaints. Denies cough, chest pain, fever, body aches, or any other complaints. As per RN, ambulated comfortably in the halls without complaints.  Filed Vitals:   03/06/16 1500 03/06/16 2135 03/07/16 0620 03/07/16 1341  BP: 140/68 155/71 158/71 128/64  Pulse: 58 60 63 59  Temp: 99.2 F (37.3 C) 98.2 F (36.8 C) 98.2 F (36.8 C) 98 F (36.7 C)  TempSrc: Oral Oral Oral Oral  Resp: 20 19 19 20   Height:      Weight:      SpO2: 97% 98% 98% 99%    General exam: Pleasant elderly male sitting up comfortably in chair this morning. Appeared nontoxic and  non-ill looking. Respiratory system: Clear. No increased work of breathing. Cardiovascular system: S1 & S2 heard, RRR. No JVD, murmurs, gallops, clicks or pedal edema. Gastrointestinal system: Abdomen is nondistended, soft and nontender. Normal bowel sounds heard. Central nervous system: Alert and oriented. No focal neurological deficits. Extremities: Symmetric 5 x 5 power.  Discharge Instructions      Discharge  Instructions    Call MD for:  difficulty breathing, headache or visual disturbances    Complete by:  As directed      Call MD for:  extreme fatigue    Complete by:  As directed      Call MD for:  persistant dizziness or light-headedness    Complete by:  As directed      Call MD for:  persistant nausea and vomiting    Complete by:  As directed      Call MD for:  severe uncontrolled pain    Complete by:  As directed      Call MD for:  temperature >100.4    Complete by:  As directed      Diet - low sodium heart healthy    Complete by:  As directed      Diet Carb Modified    Complete by:  As directed      Increase activity slowly    Complete by:  As directed             Medication List    TAKE these medications        ALPHAGAN P 0.15 % ophthalmic solution  Generic drug:  brimonidine  Place 1 drop into both eyes 2 (two) times daily.     CENTRUM ADULTS PO  Take 1 tablet by mouth daily.     Cod Liver Oil 1000 MG Caps  Take 1 capsule by mouth daily.     COMBIGAN 0.2-0.5 % ophthalmic solution  Generic drug:  brimonidine-timolol  Place 1 drop into both eyes 2 (two) times daily.     enalapril 5 MG tablet  Commonly known as:  VASOTEC  Take 1 tablet (5 mg total) by mouth 2 (two) times daily.     insulin detemir 100 UNIT/ML injection  Commonly known as:  LEVEMIR  Inject 0.4 mLs (40 Units total) into the skin every morning. And syringes 1/day  Start taking on:  03/08/2016     insulin lispro 100 UNIT/ML injection  Commonly known as:  HUMALOG  Inject 0-0.09 mLs (0-9 Units total) into the skin 3 (three) times daily with meals. CBG < 70: Eat or drink something and recheck, CBG 70 - 120: 0 units, CBG 121 - 150: 1 unit, CBG 151 - 200: 2 units, CBG 201 - 250: 3 units, CBG 251 - 300: 5 units, CBG 301 - 350: 7 units, CBG 351 - 400: 9 units, CBG > 400: call MD.     Omega 3 1200 MG Caps  Take 1 capsule by mouth daily.     oseltamivir 75 MG capsule  Commonly known as:  TAMIFLU  Take 1  capsule (75 mg total) by mouth 2 (two) times daily.     PRESCRIPTION MEDICATION  Gets injections in eyes at eye dr's office     URINOZINC PO  Take 1 tablet by mouth daily.     Vitamin D3 2000 units Tabs  Take 1 tablet by mouth daily.       Follow-up Information    Follow up with Mauricio Po, [place]. Schedule an appointment as  soon as possible for a visit in 5 days.   Specialty:  Family Medicine   Why:  To be seen with repeat labs (CBC & BMP). May need evaluation and adjustment of diabetic medications.   Contact information:   [place] [address] [phone]       Follow up with Renato Shin, MD. Schedule an appointment as soon as possible for a visit in 2 weeks.   Specialty:  Endocrinology   Contact information:   [address] [place] [address] [phone]       Get Medicines reviewed and adjusted: Please take all your medications with you for your next visit with your Primary MD  Please request your Primary MD to go over all hospital tests and procedure/radiological results at the follow up. Please ask your Primary MD to get all Hospital records sent to his/her office.  If you experience worsening of your admission symptoms, develop shortness of breath, life threatening emergency, suicidal or homicidal thoughts you must seek medical attention immediately by calling 911 or calling your MD immediately if symptoms less severe.  You must read complete instructions/literature along with all the possible adverse reactions/side effects for all the Medicines you take and that have been prescribed to you. Take any new Medicines after you have completely understood and accept all the possible adverse reactions/side effects.   Do not drive when taking pain medications.   Do not take more than prescribed Pain, Sleep and Anxiety Medications  Special Instructions: If you have smoked or chewed Tobacco in the last 2 yrs please stop smoking, stop  any regular Alcohol and or any Recreational drug use.  Wear Seat belts while driving.  Please note  You were cared for by a hospitalist during your hospital stay. Once you are discharged, your primary care physician will handle any further medical issues. Please note that NO REFILLS for any discharge medications will be authorized once you are discharged, as it is imperative that you return to your primary care physician (or establish a relationship with a primary care physician if you do not have one) for your aftercare needs so that they can reassess your need for medications and monitor your lab values.    The results of significant diagnostics from this hospitalization (including imaging, microbiology, ancillary and laboratory) are listed below for reference.    Significant Diagnostic Studies: Dg Chest 2 View  03/04/2016  CLINICAL DATA:  Fever, cough, fatigue, and vomiting for 2 days, former smoker, diabetes mellitus, prostate cancer EXAM: CHEST  2 VIEW COMPARISON:  02/17/2010 FINDINGS: Normal heart size, mediastinal contours, and pulmonary vascularity. Atherosclerotic calcification aorta. Lungs minimally hyperinflated but clear. No pulmonary infiltrate, pleural effusion or pneumothorax. No acute osseous findings. IMPRESSION: No acute abnormalities. Electronically Signed   By: Lavonia Dana M.D.   On: 03/04/2016 11:08    Microbiology: Recent Results (from the past 240 hour(s))  MRSA PCR Screening     Status: None   Collection Time: 03/04/16  4:07 PM  Result Value Ref Range Status   MRSA by PCR NEGATIVE NEGATIVE Final    Comment:        The GeneXpert MRSA Assay (FDA approved for NASAL specimens only), is one component of a comprehensive MRSA colonization surveillance program. It is not intended to diagnose MRSA infection nor to guide or monitor treatment for MRSA infections.      Labs: Basic Metabolic Panel:  Recent Labs Lab 03/05/16 0034 03/05/16 [id number] 03/05/16 [id number]  03/05/16 1855 03/06/16 0329 03/07/16 0337  NA 141 140 139  --  138 140  K 4.2 4.7 3.8  --  3.7 3.7  CL 111 111 112*  --  109 107  CO2 21* 21* 20*  --  21* 22  GLUCOSE 121* 107* 132* 293* 225* 184*  BUN 28* 27* 23*  --  15 12  CREATININE 1.09 1.04 0.97  --  0.90 0.86  CALCIUM 8.8* 8.8* 8.7*  --  8.9 8.9  MG  --   --   --   --  1.2*  --   PHOS  --   --   --   --  1.7*  --    Liver Function Tests:  Recent Labs Lab 03/04/16 1149 03/06/16 0329  AST 20 18  ALT 18 14*  ALKPHOS 86 62  BILITOT 2.4* 0.9  PROT 6.8 5.8*  ALBUMIN 3.2* 2.5*    Recent Labs Lab 03/04/16 1149  LIPASE 22   No results for input(s): AMMONIA in the last 168 hours. CBC:  Recent Labs Lab 03/04/16 1044 03/04/16 1417 03/05/16 0824 03/06/16 0329 03/07/16 0337  WBC 12.4*  --  9.1 6.6 5.8  NEUTROABS 9.8*  --  5.8 3.8  --   HGB 15.7 16.3 13.2 12.4* 12.6*  HCT 47.8 48.0 37.8* 37.2* 37.7*  MCV 88.0  --  83.8 84.4 83.4  PLT 216  --  171 198 188   Cardiac Enzymes: No results for input(s): CKTOTAL, CKMB, CKMBINDEX, TROPONINI in the last 168 hours. BNP: BNP (last 3 results) No results for input(s): BNP in the last 8760 hours.  ProBNP (last 3 results) No results for input(s): PROBNP in the last 8760 hours.  CBG:  Recent Labs Lab 03/06/16 1153 03/06/16 1708 03/06/16 2133 03/07/16 0816 03/07/16 1241  GLUCAP 134* 152* 133* 126* 157*    Discussed with daughter Duard Brady, daughter who is a Therapist, sports. Updated care and answered questions.    Signed:  Vernell Leep, MD, FACP, FHM. [facility] Pager [phone]  If 7PM-7AM, please contact night-coverage [email or web address] Password [email or web address] 03/07/2016, 5:01 PM

## 2016-03-08 ENCOUNTER — Telehealth: Payer: Self-pay | Admitting: *Deleted

## 2016-03-08 NOTE — Progress Notes (Signed)
This CM was called by Staff RN after 5pm last PM to inform me that pt's daughter wanted him to have HHPT at home.  This CM called pt and spoke with him on the phone to inquire again about desire for HHPT.  Pt again declines [facility] services.  No other CM needs communicated. Marney Doctor RN,BSN,NCM [phone]

## 2016-03-08 NOTE — Telephone Encounter (Signed)
Transition Care Management Follow-up Telephone Call   Date discharged? 03/07/16   How have you been since you were released from the hospital? Pt states he is feeling ok   Do you understand why you were in the hospital? YES   Do you understand the discharge instructions? YES   Where were you discharged to? Home   Items Reviewed:  Medications reviewed: YES  Allergies reviewed: YES  Dietary changes reviewed: YES, diabetic diet & heart healthy  Referrals reviewed: YES, he states he still have to make Dr. Loanne Drilling appt   Functional Questionnaire:   Activities of Daily Living (ADLs):   He states they are independent in the following: ambulation, bathing and hygiene, feeding, continence, grooming, toileting and dressing States he doesn't require assistance    Any transportation issues/concerns?: YES   Any patient concerns? NO   Confirmed importance and date/time of follow-up visits scheduled YES, appt 03/16/16  Provider Appointment booked with Terri Piedra, NP  Confirmed with patient if condition begins to worsen call PCP or go to the ER.  Patient was given the office number and encouraged to call back with question or concerns.  : YES

## 2016-03-12 ENCOUNTER — Encounter (INDEPENDENT_AMBULATORY_CARE_PROVIDER_SITE_OTHER): Payer: Medicare Other | Admitting: Ophthalmology

## 2016-03-16 ENCOUNTER — Inpatient Hospital Stay: Payer: Medicare Other | Admitting: Family

## 2016-03-21 ENCOUNTER — Telehealth: Payer: Self-pay | Admitting: Family

## 2016-03-21 ENCOUNTER — Other Ambulatory Visit (INDEPENDENT_AMBULATORY_CARE_PROVIDER_SITE_OTHER): Payer: Medicare Other

## 2016-03-21 ENCOUNTER — Encounter: Payer: Self-pay | Admitting: Family

## 2016-03-21 ENCOUNTER — Ambulatory Visit (INDEPENDENT_AMBULATORY_CARE_PROVIDER_SITE_OTHER): Payer: Medicare Other | Admitting: Family

## 2016-03-21 VITALS — BP 116/80 | HR 59 | Temp 98.0°F | Resp 14 | Ht 64.0 in | Wt 162.0 lb

## 2016-03-21 DIAGNOSIS — J101 Influenza due to other identified influenza virus with other respiratory manifestations: Secondary | ICD-10-CM

## 2016-03-21 DIAGNOSIS — Z125 Encounter for screening for malignant neoplasm of prostate: Secondary | ICD-10-CM

## 2016-03-21 DIAGNOSIS — E1310 Other specified diabetes mellitus with ketoacidosis without coma: Secondary | ICD-10-CM

## 2016-03-21 DIAGNOSIS — E1110 Type 2 diabetes mellitus with ketoacidosis without coma: Secondary | ICD-10-CM

## 2016-03-21 LAB — BASIC METABOLIC PANEL
BUN: 14 mg/dL (ref 6–23)
CALCIUM: 9.4 mg/dL (ref 8.4–10.5)
CO2: 27 mEq/L (ref 19–32)
CREATININE: 0.95 mg/dL (ref 0.40–1.50)
Chloride: 101 mEq/L (ref 96–112)
GFR: 82.07 mL/min (ref >60.00)
GLUCOSE: 199 mg/dL — AB (ref 70–99)
Potassium: 4.8 mEq/L (ref 3.5–5.1)
SODIUM: 135 meq/L (ref 135–145)

## 2016-03-21 LAB — CBC
HEMATOCRIT: 40.4 % (ref 39.0–52.0)
HEMOGLOBIN: 13.8 g/dL (ref 13.0–17.0)
MCHC: 34 g/dL (ref 30.0–36.0)
MCV: 83.1 fl (ref 78.0–100.0)
Platelets: 376 10*3/uL (ref 150.0–400.0)
RBC: 4.86 Mil/uL (ref 4.22–5.81)
RDW: 14.4 % (ref 11.5–15.5)
WBC: 5.3 10*3/uL (ref 4.0–10.5)

## 2016-03-21 LAB — MAGNESIUM: Magnesium: 1.6 mg/dL (ref 1.5–2.5)

## 2016-03-21 LAB — PHOSPHORUS: Phosphorus: 3.4 mg/dL (ref 2.3–4.6)

## 2016-03-21 LAB — PSA: PSA: 0.19 ng/mL (ref 0.10–4.00)

## 2016-03-21 NOTE — Telephone Encounter (Signed)
Please inform patient that his prostate function and all of his lab work is within the normal ranges. Follow up as planned with Dr. Loanne Drilling for his diabetes and for a physical in May.

## 2016-03-21 NOTE — Assessment & Plan Note (Signed)
Diabetes with improved control since leaving the hospital. Reports taking his medications as prescribed without adverse side effects or hypoglycemic readings. Continue current dosage of Humalog and Levemir with follow-up and changes per endocrinology.

## 2016-03-21 NOTE — Assessment & Plan Note (Signed)
Influenza A appears resolved with no residual effects.  Course of Tamiflu completed. No current symptoms noted. No further treatment needed at this time. Follow-up if symptoms return.

## 2016-03-21 NOTE — Progress Notes (Signed)
Pre visit review using our clinic review tool, if applicable. No additional management support is needed unless otherwise documented below in the visit note. 

## 2016-03-21 NOTE — Progress Notes (Signed)
Subjective:    Patient ID: Andre Stout, male    DOB: [birth year], 76 y.o.   MRN: [id number]  Chief Complaint  Patient presents with  . Hospitalization Follow-up    had the flu, states he is feeling much better since being out of the hospital    HPI:  Andre Stout is a 76 y.o. male who  has a past medical history of Diabetes mellitus without complication ([place]) and Prostate cancer ([place]). and presents today for an office visit following hospitalization.    recently evaluated in the emergency department and admitted to the hospital following 3 day illness with fever up to 100 , nausea, and several episodes of emesis. Physical exam was found to be mostly benign. White blood cell count was 12.4 with absolute neutrophil count of 9.8. He had greater than 1000 glucose in his urine as well as greater than 80 ketones. Sodium was 1:30 with hyperkalemia of 5.7. EKG showed sinus rhythm with prolonged PR interval and potential  biatrial enlargement and old anterior infarct. He was diagnosed with diabetic ketoacidosis and started on IV fluids and insulin drip. He did test positive for influenza A. He was treated with Tamiflu and started on levofloxacin which was discontinued. He is treated for DKA with aggressive IV fluids, insulin drip, and monitoring electrolytes. Upon resolution he was transitioned to Levemir and NovoLog sliding scale. It was noted that he was adequately controlled with lower doses of medication than what he was on.   hospitalist recommended follow-up CBC, BMP, magneisum and phosphorus.   Since leaving the hospital he reports significant improvements. Notes that he has some congestion remaining with no other symptoms. Reports that his blood sugars are well controlled with the new regimen and takes his medications as prescribed. Denies adverse side effects. States that he feels good and about 100% improved.  No Known Allergies   Current Outpatient Prescriptions on File Prior to Visit    Medication Sig Dispense Refill  . ALPHAGAN P 0.15 % ophthalmic solution Place 1 drop into the left eye 2 (two) times daily.     . Cholecalciferol (VITAMIN D3) 2000 UNITS TABS Take 1 tablet by mouth daily.     Marland Kitchen Cod Liver Oil 1000 MG CAPS Take 1 capsule by mouth daily.     . COMBIGAN 0.2-0.5 % ophthalmic solution Place 1 drop into both eyes 2 (two) times daily.  12  . enalapril (VASOTEC) 5 MG tablet Take 1 tablet (5 mg total) by mouth 2 (two) times daily. 180 tablet 3  . insulin detemir (LEVEMIR) 100 UNIT/ML injection Inject 0.4 mLs (40 Units total) into the skin every morning. And syringes 1/day    . insulin lispro (HUMALOG) 100 UNIT/ML injection Inject 0-0.09 mLs (0-9 Units total) into the skin 3 (three) times daily with meals. CBG < 70: Eat or drink something and recheck, CBG 70 - 120: 0 units, CBG 121 - 150: 1 unit, CBG 151 - 200: 2 units, CBG 201 - 250: 3 units, CBG 251 - 300: 5 units, CBG 301 - 350: 7 units, CBG 351 - 400: 9 units, CBG > 400: call MD. 10 mL 0  . Misc Natural Products (URINOZINC PO) Take 1 tablet by mouth daily.     . Multiple Vitamins-Minerals (CENTRUM ADULTS PO) Take 1 tablet by mouth daily.     . Omega 3 1200 MG CAPS Take 1 capsule by mouth daily.     Marland Kitchen oseltamivir (TAMIFLU) 75 MG capsule Take 1 capsule (  75 mg total) by mouth 2 (two) times daily. 5 capsule 0  . PRESCRIPTION MEDICATION Gets injections in eyes at eye dr's office     No current facility-administered medications on file prior to visit.     Past Surgical History  Procedure Laterality Date  . Cataract extraction Bilateral     Past Medical History  Diagnosis Date  . Diabetes mellitus without complication ([place])   . Prostate cancer ([place])     Been 3-4 years ago     Review of Systems  Constitutional: Negative for fever and chills.  Respiratory: Negative for chest tightness and wheezing.   Cardiovascular: Negative for chest pain, palpitations and leg swelling.      Objective:    BP 116/80 mmHg   Pulse 59  Temp(Src) 98 F (36.7 C) (Oral)  Resp 14  Ht 5\' 4"  (1.626 m)  Wt 162 lb (73.483 kg)  BMI 27.79 kg/m2  SpO2 97% Nursing note and vital signs reviewed.  Physical Exam  Constitutional: He is oriented to person, place, and time. He appears well-developed and well-nourished. No distress.  HENT:  Right Ear: Hearing, tympanic membrane, external ear and ear canal normal.  Left Ear: Hearing, tympanic membrane, external ear and ear canal normal.  Nose: Nose normal.  Mouth/Throat: Uvula is midline, oropharynx is clear and moist and mucous membranes are normal.  Cardiovascular: Normal rate, regular rhythm, normal heart sounds and intact distal pulses.   Pulmonary/Chest: Effort normal and breath sounds normal.  Neurological: He is alert and oriented to person, place, and time.  Skin: Skin is warm and dry.  Psychiatric: He has a normal mood and affect. His behavior is normal. Judgment and thought content normal.       Assessment & Plan:   Problem List Items Addressed This Visit      Respiratory   Influenza A     Influenza A appears resolved with no residual effects.  Course of Tamiflu completed. No current symptoms noted. No further treatment needed at this time. Follow-up if symptoms return.      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 Reports taking his medications as prescribed without adverse side effects or hypoglycemic readings. Continue current dosage of Humalog and Levemir with follow-up and changes per endocrinology.      Relevant Orders   CBC   Basic Metabolic Panel (BMET)   Phosphorus   Magnesium    Other Visit Diagnoses    Prostate cancer screening    -  Primary    Relevant Orders    PSA

## 2016-03-21 NOTE — Patient Instructions (Addendum)
Thank you for choosing [facility].  Summary/Instructions:   please continue to take her medications as prescribed.   Follow-up with endocrinology as scheduled.   please schedule a time for your wellness exam around May.  If your symptoms worsen or fail to improve, please contact our office for further instruction, or in case of emergency go directly to the emergency room at the closest medical facility.

## 2016-03-23 NOTE — Telephone Encounter (Signed)
LVM letting pt know results. 

## 2016-03-26 ENCOUNTER — Encounter (INDEPENDENT_AMBULATORY_CARE_PROVIDER_SITE_OTHER): Payer: Medicare Other | Admitting: Ophthalmology

## 2016-03-26 DIAGNOSIS — E113513 Type 2 diabetes mellitus with proliferative diabetic retinopathy with macular edema, bilateral: Secondary | ICD-10-CM | POA: Diagnosis not present

## 2016-03-26 DIAGNOSIS — H35033 Hypertensive retinopathy, bilateral: Secondary | ICD-10-CM

## 2016-03-26 DIAGNOSIS — E11311 Type 2 diabetes mellitus with unspecified diabetic retinopathy with macular edema: Secondary | ICD-10-CM

## 2016-03-26 DIAGNOSIS — H43813 Vitreous degeneration, bilateral: Secondary | ICD-10-CM

## 2016-03-26 DIAGNOSIS — H35373 Puckering of macula, bilateral: Secondary | ICD-10-CM

## 2016-03-26 DIAGNOSIS — I10 Essential (primary) hypertension: Secondary | ICD-10-CM | POA: Diagnosis not present

## 2016-03-27 ENCOUNTER — Other Ambulatory Visit: Payer: Self-pay

## 2016-03-27 ENCOUNTER — Ambulatory Visit (INDEPENDENT_AMBULATORY_CARE_PROVIDER_SITE_OTHER): Payer: Medicare Other | Admitting: Endocrinology

## 2016-03-27 ENCOUNTER — Encounter: Payer: Self-pay | Admitting: Endocrinology

## 2016-03-27 VITALS — BP 116/64 | HR 61 | Temp 98.1°F | Ht 64.0 in | Wt 162.0 lb

## 2016-03-27 DIAGNOSIS — E10319 Type 1 diabetes mellitus with unspecified diabetic retinopathy without macular edema: Secondary | ICD-10-CM

## 2016-03-27 MED ORDER — GLUCOSE BLOOD VI STRP
ORAL_STRIP | Status: DC
Start: 2016-03-27 — End: 2016-04-23

## 2016-03-27 MED ORDER — GLUCOSE BLOOD VI STRP: ORAL_STRIP | Status: DC

## 2016-03-27 MED ORDER — INSULIN DETEMIR 100 UNIT/ML ~~LOC~~ SOLN: 80.0000 [IU] | SUBCUTANEOUS | Status: DC

## 2016-03-27 MED ORDER — ONETOUCH ULTRA MINI W/DEVICE KIT
PACK | Status: DC
Start: 2016-03-27 — End: 2016-04-23

## 2016-03-27 MED ORDER — ONETOUCH DELICA LANCETS 33G MISC: Status: DC

## 2016-03-27 NOTE — Progress Notes (Signed)
Subjective:    Patient ID: Andre Stout, male    DOB: 1940/06/23, 76 y.o.   MRN: [id number]  HPI Pt returns for f/u of diabetes mellitus: DM type: 1 Dx'ed: 0000000 Complications: retinopathy.   Therapy: insulin since 2006 DKA: once, in 2017 Severe hypoglycemia: never.  Pancreatitis: never Other: he has chosen a qd insulin regimen; he had to d/c pioglitizone in 2016, due to edema; he takes human insulin, due to cost; on lantus, he had am hypoglycemia, and PM hyperglycemia; on 75/25, he had the opposite.  Interval history: he was recently hospitalized for DKA.  Prior to the hospitalization, he says he is not sure if he was taking levemir as rx'ed.  He takes levemir, 40 units bid, and prn humalog (but he takes very little of this).  no cbg record, and he does not know his his cbg's have been.  He says when he took 100 units qam, he was having frequent hypoglycemia.   Past Medical History  Diagnosis Date  . Diabetes mellitus without complication ([place])   . Prostate cancer ([place])     Been 3-4 years ago    Past Surgical History  Procedure Laterality Date  . Cataract extraction Bilateral     Social History   Social History  . Marital Status: Widowed    Spouse Name: N/A  . Number of Children: 1  . Years of Education: 12   Occupational History  . Retired    Social History Main Topics  . Smoking status: Former Smoker -- 0.50 packs/day for 4 years    Types: Cigarettes    Quit date: 10/29/1974  . Smokeless tobacco: Never Used  . Alcohol Use: No  . Drug Use: No  . Sexual Activity: Not Currently   Other Topics Concern  . Not on file   Social History Narrative   Born and raised in [place], [place]. Currently reside in a private residence by himself. Daughter lives in the area. No live. Fun: hunt and fish   Denies religious beliefs that would effect health care.     Current Outpatient Prescriptions on File Prior to Visit  Medication Sig Dispense Refill  . ALPHAGAN P 0.15 %  ophthalmic solution Place 1 drop into the left eye 2 (two) times daily.     . Cholecalciferol (VITAMIN D3) 2000 UNITS TABS Take 1 tablet by mouth daily.     Marland Kitchen Cod Liver Oil 1000 MG CAPS Take 1 capsule by mouth daily.     . COMBIGAN 0.2-0.5 % ophthalmic solution Place 1 drop into both eyes 2 (two) times daily.  12  . enalapril (VASOTEC) 5 MG tablet Take 1 tablet (5 mg total) by mouth 2 (two) times daily. 180 tablet 3  . insulin lispro (HUMALOG) 100 UNIT/ML injection Inject 0-0.09 mLs (0-9 Units total) into the skin 3 (three) times daily with meals. CBG < 70: Eat or drink something and recheck, CBG 70 - 120: 0 units, CBG 121 - 150: 1 unit, CBG 151 - 200: 2 units, CBG 201 - 250: 3 units, CBG 251 - 300: 5 units, CBG 301 - 350: 7 units, CBG 351 - 400: 9 units, CBG > 400: call MD. 10 mL 0  . Misc Natural Products (URINOZINC PO) Take 1 tablet by mouth daily.     . Multiple Vitamins-Minerals (CENTRUM ADULTS PO) Take 1 tablet by mouth daily.     . Omega 3 1200 MG CAPS Take 1 capsule by mouth daily.     Marland Kitchen  oseltamivir (TAMIFLU) 75 MG capsule Take 1 capsule (75 mg total) by mouth 2 (two) times daily. 5 capsule 0  . PRESCRIPTION MEDICATION Gets injections in eyes at eye dr's office     No current facility-administered medications on file prior to visit.    No Known Allergies  Family History  Problem Relation Age of Onset  . Diabetes Father   . Diabetes Paternal Grandfather     BP 116/64 mmHg  Pulse 61  Temp(Src) 98.1 F (36.7 C) (Oral)  Ht 5\' 4"  (1.626 m)  Wt 162 lb (73.483 kg)  BMI 27.79 kg/m2  SpO2 97%  Review of Systems He denies hypoglycemia    Objective:   Physical Exam VITAL SIGNS:  See vs page GENERAL: no distress Pulses: dorsalis pedis intact bilat.   MSK: no deformity of the feet CV: no leg edema Skin:  no ulcer on the feet.  normal color and temp on the feet. Neuro: sensation is intact to touch on the feet.  Ext: There is bilateral onychomycosis of the toenails   Lab  Results  Component Value Date   CREATININE 0.95 03/21/2016   BUN 14 03/21/2016   NA 135 03/21/2016   K 4.8 03/21/2016   CL 101 03/21/2016   CO2 27 03/21/2016      Assessment & Plan:  DM: he needs increased rx DKA: new to me.  I told pt in view of new dx of type 1 dm. It is very dangerous for him to miss any insulin doses. Noncompliance with insulin.  In view of this, he needs to take insulin just qd.    Patient is advised the following: Patient Instructions  check your blood sugar twice a day.  vary the time of day when you check, between before the 3 meals, and at bedtime.  also check if you have symptoms of your blood sugar being too high or too low.  please keep a record of the readings and bring it to your next appointment here.  You can write it on any piece of paper.  please call us sooner if your blood sugar goes below 70, or if you have a lot of readings over 200.   Please change the levemir to 80 units each morning.   On this type of insulin schedule, you should eat meals on a regular schedule.  If a meal is missed or significantly delayed, your blood sugar could go low. Please come back for a follow-up appointment in 1 month.

## 2016-03-27 NOTE — Patient Instructions (Addendum)
check your blood sugar twice a day.  vary the time of day when you check, between before the 3 meals, and at bedtime.  also check if you have symptoms of your blood sugar being too high or too low.  please keep a record of the readings and bring it to your next appointment here.  You can write it on any piece of paper.  please call us sooner if your blood sugar goes below 70, or if you have a lot of readings over 200.   Please change the levemir to 80 units each morning.   On this type of insulin schedule, you should eat meals on a regular schedule.  If a meal is missed or significantly delayed, your blood sugar could go low. Please come back for a follow-up appointment in 1 month.

## 2016-03-28 ENCOUNTER — Other Ambulatory Visit: Payer: Self-pay

## 2016-03-28 DIAGNOSIS — E119 Type 2 diabetes mellitus without complications: Secondary | ICD-10-CM | POA: Insufficient documentation

## 2016-04-17 ENCOUNTER — Other Ambulatory Visit: Payer: Self-pay

## 2016-04-17 ENCOUNTER — Other Ambulatory Visit: Payer: Medicare Other

## 2016-04-17 ENCOUNTER — Encounter: Payer: Medicare Other | Attending: Endocrinology | Admitting: Nutrition

## 2016-04-17 DIAGNOSIS — E10319 Type 1 diabetes mellitus with unspecified diabetic retinopathy without macular edema: Secondary | ICD-10-CM

## 2016-04-17 MED ORDER — INSULIN SYRINGE 30G X 5/16" 0.3 ML MISC
Status: DC
Start: 2016-04-17 — End: 2019-08-31

## 2016-04-17 MED ORDER — INSULIN DETEMIR 100 UNIT/ML ~~LOC~~ SOLN: SUBCUTANEOUS | Status: DC

## 2016-04-17 MED ORDER — INSULIN LISPRO 100 UNIT/ML ~~LOC~~ SOLN: 0.0000 [IU] | Freq: Once | SUBCUTANEOUS | Status: DC

## 2016-04-17 NOTE — Progress Notes (Signed)
Mr. Marmolejos was taking Levemir 80 units once daily, and said his blood sugars were dropping low every afternoon after lunch.  He was in the hospital 2 weeks ago , and they switch him to 40u BID, and he says that he has not dropped low since then. They also started him on Humalog before each meal, as a sliding scale:  70-120: 0 units, 121-150: 1u,  151-200: 2 units, 201-250:3u, 251-300: 5 u, 301-350: 7 u,  351-400: 9 u,  >400 call MD  He says he is testing his blood sugars 4XD, and taking the Humalog before all meals; 8AM 12PM 6PM.  He reports taking his Levemir at [place] and again at [facility].  He did not bring his meter, but says FBSs: 70s-160,  AcL: 115-120, acS: all < 150, HS: 160-200 Denies any low blood sugar symptoms.  Meals are balanced and he is not drinking sweet drinks.  He had no questions for me at this time.  He is wants a prescription for Humalog and Levemir and test stips today. He was made an appt. With Dr. Loanne Drilling for 2 weeks, and told to bring his meter to each visit.  He agreed to do this.

## 2016-04-18 ENCOUNTER — Other Ambulatory Visit: Payer: Self-pay | Admitting: Endocrinology

## 2016-04-18 LAB — FRUCTOSAMINE: FRUCTOSAMINE: 474 umol/L — AB (ref 0–285)

## 2016-04-18 MED ORDER — INSULIN DETEMIR 100 UNIT/ML ~~LOC~~ SOLN: 80.0000 [IU] | SUBCUTANEOUS | Status: DC

## 2016-04-18 NOTE — Patient Instructions (Signed)
Bring meter to all visit. Take Humalog before all meals per your sliding scale Continue to take the Levemir 40u twice daily Test blood sugars before meals and at bedtime.

## 2016-04-23 ENCOUNTER — Other Ambulatory Visit: Payer: Self-pay

## 2016-04-23 ENCOUNTER — Encounter: Payer: Self-pay | Admitting: Endocrinology

## 2016-04-23 ENCOUNTER — Ambulatory Visit (INDEPENDENT_AMBULATORY_CARE_PROVIDER_SITE_OTHER): Payer: Medicare Other | Admitting: Endocrinology

## 2016-04-23 VITALS — BP 120/80 | HR 67 | Temp 97.9°F | Ht 64.0 in | Wt 168.0 lb

## 2016-04-23 DIAGNOSIS — E10319 Type 1 diabetes mellitus with unspecified diabetic retinopathy without macular edema: Secondary | ICD-10-CM | POA: Diagnosis not present

## 2016-04-23 LAB — POCT GLYCOSYLATED HEMOGLOBIN (HGB A1C): HEMOGLOBIN A1C: 10.4

## 2016-04-23 LAB — GLUCOSE, POCT (MANUAL RESULT ENTRY): POC Glucose: 326 mg/dl — AB (ref 70–99)

## 2016-04-23 MED ORDER — GLUCOSE BLOOD VI STRP: 1.0000 | ORAL_STRIP | Freq: Four times a day (QID) | Status: DC

## 2016-04-23 MED ORDER — INSULIN DETEMIR 100 UNIT/ML ~~LOC~~ SOLN: 100.0000 [IU] | SUBCUTANEOUS | Status: DC

## 2016-04-23 MED ORDER — GLUCOSE BLOOD VI STRP: ORAL_STRIP | Status: DC

## 2016-04-23 NOTE — Patient Instructions (Addendum)
check your blood sugar twice a day.  vary the time of day when you check, between before the 3 meals, and at bedtime.  also check if you have symptoms of your blood sugar being too high or too low.  please keep a record of the readings and bring it to your next appointment here.  You can write it on any piece of paper.  please call us sooner if your blood sugar goes below 70, or if you have a lot of readings over 200.   Please increase the levemir to 100 units each morning.   On this type of insulin schedule, you should eat meals on a regular schedule.  If a meal is missed or significantly delayed, your blood sugar could go low.   Please come back for a follow-up appointment in 1 month.

## 2016-04-23 NOTE — Progress Notes (Signed)
Subjective:    Patient ID: Andre Stout, male    DOB: 01-31-1940, 76 y.o.   MRN: [id number]  HPI Pt returns for f/u of diabetes mellitus: DM type: 1 Dx'ed: 0000000 Complications: retinopathy.   Therapy: insulin since 2006 DKA: once, in 2017 Severe hypoglycemia: never.  Pancreatitis: never Other: he has chosen a qd insulin regimen; he had to d/c pioglitizone in 2016, due to edema; on lantus, he had am hypoglycemia, and PM hyperglycemia; on 75/25, he had the opposite.  Interval history: pt recently was found to have vastly elevated a1c and fructosamine, despite his reports of cbg's in the low-100's.  He says he never misses the insulin.  pt states he feels well in general.  Pt checks cbg in the office today: 315 Past Medical History  Diagnosis Date  . Diabetes mellitus without complication ([place])   . Prostate cancer ([place])     Been 3-4 years ago    Past Surgical History  Procedure Laterality Date  . Cataract extraction Bilateral     Social History   Social History  . Marital Status: Widowed    Spouse Name: N/A  . Number of Children: 1  . Years of Education: 12   Occupational History  . Retired    Social History Main Topics  . Smoking status: Former Smoker -- 0.50 packs/day for 4 years    Types: Cigarettes    Quit date: 10/29/1974  . Smokeless tobacco: Never Used  . Alcohol Use: No  . Drug Use: No  . Sexual Activity: Not Currently   Other Topics Concern  . Not on file   Social History Narrative   Born and raised in [place], [place]. Currently reside in a private residence by himself. Daughter lives in the area. No live. Fun: hunt and fish   Denies religious beliefs that would effect health care.     Current Outpatient Prescriptions on File Prior to Visit  Medication Sig Dispense Refill  . ALPHAGAN P 0.15 % ophthalmic solution Place 1 drop into the left eye 2 (two) times daily.     . Cholecalciferol (VITAMIN D3) 2000 UNITS TABS Take 1 tablet by mouth daily.     Marland Kitchen  Cod Liver Oil 1000 MG CAPS Take 1 capsule by mouth daily.     . COMBIGAN 0.2-0.5 % ophthalmic solution Place 1 drop into both eyes 2 (two) times daily.  12  . enalapril (VASOTEC) 5 MG tablet Take 1 tablet (5 mg total) by mouth 2 (two) times daily. 180 tablet 3  . Insulin Syringe-Needle U-100 (INSULIN SYRINGE .3CC/30GX5/16") 30G X 5/16" 0.3 ML MISC Use to inject 3 times per day. 100 each 0  . Misc Natural Products (URINOZINC PO) Take 1 tablet by mouth daily.     . Multiple Vitamins-Minerals (CENTRUM ADULTS PO) Take 1 tablet by mouth daily.     . Omega 3 1200 MG CAPS Take 1 capsule by mouth daily.     Glory Rosebush DELICA LANCETS [id number] MISC Use to check blood sugar 4 times per day. Dx code: E11.9 200 each 2  . PRESCRIPTION MEDICATION Gets injections in eyes at eye dr's office     No current facility-administered medications on file prior to visit.    No Known Allergies  Family History  Problem Relation Age of Onset  . Diabetes Father   . Diabetes Paternal Grandfather     BP 120/80 mmHg  Pulse 67  Temp(Src) 97.9 F (36.6 C) (Oral)  Ht 5\' 4"  (  1.626 m)  Wt 168 lb (76.204 kg)  BMI 28.82 kg/m2  SpO2 97%  Review of Systems He denies hypoglycemia    Objective:   Physical Exam VITAL SIGNS:  See vs page GENERAL: no distress SKIN:  Insulin injection sites at the anterior abdomen are normal.    A1c=10.4%    Assessment & Plan:  DM: ongoing poor control.  therapy limited by noncompliance with cbg recording. Today's cbg's closely correlate (between pt's and our office), which raises ? of accuracy of cbg reporting.  We'll have to titrate levemir according to a1c and/or fructosamine.    Patient is advised the following: Patient Instructions  check your blood sugar twice a day.  vary the time of day when you check, between before the 3 meals, and at bedtime.  also check if you have symptoms of your blood sugar being too high or too low.  please keep a record of the readings and bring it to your  next appointment here.  You can write it on any piece of paper.  please call us sooner if your blood sugar goes below 70, or if you have a lot of readings over 200.   Please increase the levemir to 100 units each morning.   On this type of insulin schedule, you should eat meals on a regular schedule.  If a meal is missed or significantly delayed, your blood sugar could go low.   Please come back for a follow-up appointment in 1 month.

## 2016-04-25 ENCOUNTER — Ambulatory Visit: Payer: Medicare Other | Admitting: Endocrinology

## 2016-04-30 ENCOUNTER — Ambulatory Visit: Payer: Medicare Other | Admitting: Endocrinology

## 2016-05-07 ENCOUNTER — Encounter (INDEPENDENT_AMBULATORY_CARE_PROVIDER_SITE_OTHER): Payer: Medicare Other | Admitting: Ophthalmology

## 2016-05-07 DIAGNOSIS — I10 Essential (primary) hypertension: Secondary | ICD-10-CM

## 2016-05-07 DIAGNOSIS — H43813 Vitreous degeneration, bilateral: Secondary | ICD-10-CM | POA: Diagnosis not present

## 2016-05-07 DIAGNOSIS — E113513 Type 2 diabetes mellitus with proliferative diabetic retinopathy with macular edema, bilateral: Secondary | ICD-10-CM

## 2016-05-07 DIAGNOSIS — E11311 Type 2 diabetes mellitus with unspecified diabetic retinopathy with macular edema: Secondary | ICD-10-CM

## 2016-05-07 DIAGNOSIS — H35033 Hypertensive retinopathy, bilateral: Secondary | ICD-10-CM | POA: Diagnosis not present

## 2016-05-07 DIAGNOSIS — H35373 Puckering of macula, bilateral: Secondary | ICD-10-CM

## 2016-05-21 ENCOUNTER — Ambulatory Visit (INDEPENDENT_AMBULATORY_CARE_PROVIDER_SITE_OTHER): Payer: Medicare Other | Admitting: Family

## 2016-05-21 ENCOUNTER — Encounter: Payer: Self-pay | Admitting: Family

## 2016-05-21 VITALS — BP 128/80 | HR 81 | Temp 97.6°F | Resp 16 | Ht 64.0 in | Wt 170.0 lb

## 2016-05-21 DIAGNOSIS — Z0000 Encounter for general adult medical examination without abnormal findings: Secondary | ICD-10-CM

## 2016-05-21 DIAGNOSIS — Z23 Encounter for immunization: Secondary | ICD-10-CM | POA: Diagnosis not present

## 2016-05-21 NOTE — Progress Notes (Signed)
Subjective:    Patient ID: Andre Stout, male    DOB: [birth year], 76 y.o.   MRN: [id number]  Chief Complaint  Patient presents with  . CPE    not fasting    HPI:  Andre Stout is a 76 y.o. male who presents today for an annual wellness visit.   1) Health Maintenance -   Diet - Averaging about 3 meals per day consisting of fruits, vegetables, chicken, beef, pork and fish; Caffeine intake of about 3-4 cups per day  Exercise - No structured exercise  2) Preventative Exams / Immunizations:  Dental -- Dentures  Vision -- Up to date    Health Maintenance  Topic Date Due  . TETANUS/TDAP  11/14/1959  . ZOSTAVAX  11/13/2000  . PNA vac Low Risk Adult (2 of 2 - PPSV23) 05/01/2016  . COLONOSCOPY  04/30/2017 (Originally 11/13/1990)  . INFLUENZA VACCINE  07/31/2016  . HEMOGLOBIN A1C  10/23/2016  . OPHTHALMOLOGY EXAM  01/29/2017  . FOOT EXAM  03/27/2017    Immunization History  Administered Date(s) Administered  . Influenza, High Dose Seasonal PF 11/05/2015  . Pneumococcal Conjugate-13 05/02/2015   No Known Allergies   Outpatient Prescriptions Prior to Visit  Medication Sig Dispense Refill  . ALPHAGAN P 0.15 % ophthalmic solution Place 1 drop into the left eye 2 (two) times daily.     . Cholecalciferol (VITAMIN D3) 2000 UNITS TABS Take 1 tablet by mouth daily.     Marland Kitchen Cod Liver Oil 1000 MG CAPS Take 1 capsule by mouth daily.     . COMBIGAN 0.2-0.5 % ophthalmic solution Place 1 drop into both eyes 2 (two) times daily.  12  . enalapril (VASOTEC) 5 MG tablet Take 1 tablet (5 mg total) by mouth 2 (two) times daily. 180 tablet 3  . glucose blood (ONE TOUCH ULTRA TEST) test strip Use to check blood sugar 4 times per day. 150 each 5  . insulin detemir (LEVEMIR) 100 UNIT/ML injection Inject 1 mL (100 Units total) into the skin every morning. 40 mL 0  . Insulin Syringe-Needle U-100 (INSULIN SYRINGE .3CC/30GX5/16") 30G X 5/16" 0.3 ML MISC Use to inject 3 times per day. 100 each 0   . Misc Natural Products (URINOZINC PO) Take 1 tablet by mouth daily.     . Multiple Vitamins-Minerals (CENTRUM ADULTS PO) Take 1 tablet by mouth daily.     . Omega 3 1200 MG CAPS Take 1 capsule by mouth daily.     Glory Rosebush DELICA LANCETS 99991111 MISC Use to check blood sugar 4 times per day. Dx code: E11.9 200 each 2  . PRESCRIPTION MEDICATION Gets injections in eyes at eye dr's office     No facility-administered medications prior to visit.     Past Medical History  Diagnosis Date  . Diabetes mellitus without complication ([place])   . Prostate cancer ([place])     Been 3-4 years ago     Past Surgical History  Procedure Laterality Date  . Cataract extraction Bilateral      Family History  Problem Relation Age of Onset  . Diabetes Father   . Diabetes Paternal Grandfather      Social History   Social History  . Marital Status: Widowed    Spouse Name: N/A  . Number of Children: 1  . Years of Education: 12   Occupational History  . Retired    Social History Main Topics  . Smoking status: Former Smoker -- 0.50 packs/day  for 4 years    Types: Cigarettes    Quit date: 10/29/1974  . Smokeless tobacco: Never Used  . Alcohol Use: No  . Drug Use: No  . Sexual Activity: Not Currently   Other Topics Concern  . Not on file   Social History Narrative   Born and raised in [place], [place]. Currently reside in a private residence by himself. Daughter lives in the area. No live. Fun: hunt and fish   Denies religious beliefs that would effect health care.     Review of Systems  Constitutional: Denies fever, chills, fatigue, or significant weight gain/loss. HENT: Head: Denies headache or neck pain Ears: Denies changes in hearing, ringing in ears, earache, drainage Nose: Denies discharge, stuffiness, itching, nosebleed, sinus pain Throat: Denies sore throat, hoarseness, dry mouth, sores, thrush Eyes: Denies loss/changes in vision, pain, redness, blurry/double vision, flashing  lights Cardiovascular: Denies chest pain/discomfort, tightness, palpitations, shortness of breath with activity, difficulty lying down, swelling, sudden awakening with shortness of breath Respiratory: Denies shortness of breath, cough, sputum production, wheezing Gastrointestinal: Denies dysphasia, heartburn, change in appetite, nausea, change in bowel habits, rectal bleeding, constipation, diarrhea, yellow skin or eyes Genitourinary: Denies frequency, urgency, burning/pain, blood in urine, incontinence, change in urinary strength. Musculoskeletal: Denies muscle/joint pain, stiffness, back pain, redness or swelling of joints, trauma Skin: Denies rashes, lumps, itching, dryness, color changes, or hair/nail changes Neurological: Denies dizziness, fainting, seizures, weakness, numbness, tingling, tremor Psychiatric - Denies nervousness, stress, depression or memory loss Endocrine: Denies heat or cold intolerance, sweating, frequent urination, excessive thirst, changes in appetite Hematologic: Denies ease of bruising or bleeding     Objective:     BP 128/80 mmHg  Pulse 81  Temp(Src) 97.6 F (36.4 C) (Oral)  Resp 16  Ht 5\' 4"  (1.626 m)  Wt 170 lb (77.111 kg)  BMI 29.17 kg/m2  SpO2 96% Nursing note and vital signs reviewed.  Physical Exam  Constitutional: He is oriented to person, place, and time. He appears well-developed and well-nourished.  HENT:  Head: Normocephalic.  Right Ear: Hearing, tympanic membrane, external ear and ear canal normal.  Left Ear: Hearing, tympanic membrane, external ear and ear canal normal.  Nose: Nose normal.  Mouth/Throat: Uvula is midline, oropharynx is clear and moist and mucous membranes are normal.  Eyes: Conjunctivae and EOM are normal. Pupils are equal, round, and reactive to light.  Neck: Neck supple. No JVD present. No tracheal deviation present. No thyromegaly present.  Cardiovascular: Normal rate, regular rhythm, normal heart sounds and intact  distal pulses.   Pulmonary/Chest: Effort normal and breath sounds normal.  Abdominal: Soft. Bowel sounds are normal. He exhibits no distension and no mass. There is no tenderness. There is no rebound and no guarding.  Musculoskeletal: Normal range of motion. He exhibits no edema or tenderness.  Lymphadenopathy:    He has no cervical adenopathy.  Neurological: He is alert and oriented to person, place, and time. He has normal reflexes. No cranial nerve deficit. He exhibits normal muscle tone. Coordination normal.  Skin: Skin is warm and dry.  Psychiatric: He has a normal mood and affect. His behavior is normal. Judgment and thought content normal.       Assessment & Plan:   Problem List Items Addressed This Visit      Other   Routine general medical examination at a health care facility - Primary    1) Anticipatory Guidance: Discussed importance of wearing a seatbelt while driving and not texting while driving; changing  batteries in smoke detector at least once annually; wearing suntan lotion when outside; eating a balanced and moderate diet; getting physical activity at least 30 minutes per day.  2) Immunizations / Screenings / Labs:  Tetanus and Pneuovax updated today. Declines Zostavax. All other immunizations are up to date per recommendations. Due for a colonoscopy which he says he will check with his GI doctor. All other screenings are up to date per recommendations. Previous blood work reviewed and will obtain lipid profile.   Overall well exam with risk factors for cardiovascular disease including overweight and type 1 diabetes. Recommended weight loss of approximately 5-10% of current body weight through nutrition and physical activity. Recommend increasing physical activity to 30 minutes of moderate level activity daily. Encourage nutritional intake that focuses on nutrient dense foods and is moderate, varied, and balanced and is low in saturated fats and processed/sugary foods.  Continue diabetes management per endocrinology. Continue other healthy lifestyle behaviors and choices. Follow-up prevention exam in 1 year. Follow-up office visit for chronic conditions as necessary.      Relevant Orders   Lipid panel       I am having Mr. Vogelpohl maintain his Vitamin D3, Cod Liver Oil, Misc Natural Products (URINOZINC PO), Multiple Vitamins-Minerals (CENTRUM ADULTS PO), Omega 3, enalapril, COMBIGAN, ALPHAGAN P, PRESCRIPTION MEDICATION, ONETOUCH DELICA LANCETS [id number], INSULIN SYRINGE .3CC/30GX5/16", insulin detemir, and glucose blood.   Follow-up: Return if symptoms worsen or fail to improve.   Mauricio Po, FNP

## 2016-05-21 NOTE — Patient Instructions (Signed)
Thank you for choosing [facility].  Summary/Instructions:   Please stop by the lab on the basement level of the building for your blood work. Your results will be released to [place] (or called to you) after review, usually within 72 hours after test completion. If any changes need to be made, you will be notified at that same time.  Health Maintenance  Topic Date Due  . TETANUS/TDAP  11/14/1959  . COLONOSCOPY  11/13/1990  . ZOSTAVAX  11/13/2000  . PNA vac Low Risk Adult (2 of 2 - PPSV23) 05/01/2016  . INFLUENZA VACCINE  07/31/2016  . HEMOGLOBIN A1C  10/23/2016  . OPHTHALMOLOGY EXAM  01/29/2017  . FOOT EXAM  03/27/2017   Health Maintenance, Male A healthy lifestyle and preventative care can promote health and wellness.  Maintain regular health, dental, and eye exams.  Eat a healthy diet. Foods like vegetables, fruits, whole grains, low-fat dairy products, and lean protein foods contain the nutrients you need and are low in calories. Decrease your intake of foods high in solid fats, added sugars, and salt. Get information about a proper diet from your health care provider, if necessary.  Regular physical exercise is one of the most important things you can do for your health. Most adults should get at least 150 minutes of moderate-intensity exercise (any activity that increases your heart rate and causes you to sweat) each week. In addition, most adults need muscle-strengthening exercises on 2 or more days a week.   Maintain a healthy weight. The body mass index (BMI) is a screening tool to identify possible weight problems. It provides an estimate of body fat based on height and weight. Your health care provider can find your BMI and can help you achieve or maintain a healthy weight. For males 20 years and older:  A BMI below 18.5 is considered underweight.  A BMI of 18.5 to 24.9 is normal.  A BMI of 25 to 29.9 is considered overweight.  A BMI of 30 and above is considered  obese.  Maintain normal blood lipids and cholesterol by exercising and minimizing your intake of saturated fat. Eat a balanced diet with plenty of fruits and vegetables. Blood tests for lipids and cholesterol should begin at age 73 and be repeated every 5 years. If your lipid or cholesterol levels are high, you are over age 86, or you are at high risk for heart disease, you may need your cholesterol levels checked more frequently.Ongoing high lipid and cholesterol levels should be treated with medicines if diet and exercise are not working.  If you smoke, find out from your health care provider how to quit. If you do not use tobacco, do not start.  Lung cancer screening is recommended for adults aged 55-80 years who are at high risk for developing lung cancer because of a history of smoking. A yearly low-dose CT scan of the lungs is recommended for people who have at least a 30-pack-year history of smoking and are current smokers or have quit within the past 15 years. A pack year of smoking is smoking an average of 1 pack of cigarettes a day for 1 year (for example, a 30-pack-year history of smoking could mean smoking 1 pack a day for 30 years or 2 packs a day for 15 years). Yearly screening should continue until the smoker has stopped smoking for at least 15 years. Yearly screening should be stopped for people who develop a health problem that would prevent them from having lung cancer  treatment.  If you choose to drink alcohol, do not have more than 2 drinks per day. One drink is considered to be 12 oz (360 mL) of beer, 5 oz (150 mL) of wine, or 1.5 oz (45 mL) of liquor.  Avoid the use of street drugs. Do not share needles with anyone. Ask for help if you need support or instructions about stopping the use of drugs.  High blood pressure causes heart disease and increases the risk of stroke. High blood pressure is more likely to develop in:  People who have blood pressure in the end of the normal  range (100-139/85-89 mm Hg).  People who are overweight or obese.  People who are African American.  If you are 84-56 years of age, have your blood pressure checked every 3-5 years. If you are 75 years of age or older, have your blood pressure checked every year. You should have your blood pressure measured twice--once when you are at a hospital or clinic, and once when you are not at a hospital or clinic. Record the average of the two measurements. To check your blood pressure when you are not at a hospital or clinic, you can use:  An automated blood pressure machine at a pharmacy.  A home blood pressure monitor.  If you are 48-85 years old, ask your health care provider if you should take aspirin to prevent heart disease.  Diabetes screening involves taking a blood sample to check your fasting blood sugar level. This should be done once every 3 years after age 67 if you are at a normal weight and without risk factors for diabetes. Testing should be considered at a younger age or be carried out more frequently if you are overweight and have at least 1 risk factor for diabetes.  Colorectal cancer can be detected and often prevented. Most routine colorectal cancer screening begins at the age of 12 and continues through age 67. However, your health care provider may recommend screening at an earlier age if you have risk factors for colon cancer. On a yearly basis, your health care provider may provide home test kits to check for hidden blood in the stool. A small camera at the end of a tube may be used to directly examine the colon (sigmoidoscopy or colonoscopy) to detect the earliest forms of colorectal cancer. Talk to your health care provider about this at age 64 when routine screening begins. A direct exam of the colon should be repeated every 5-10 years through age 97, unless early forms of precancerous polyps or small growths are found.  People who are at an increased risk for hepatitis B  should be screened for this virus. You are considered at high risk for hepatitis B if:  You were born in a country where hepatitis B occurs often. Talk with your health care provider about which countries are considered high risk.  Your parents were born in a high-risk country and you have not received a shot to protect against hepatitis B (hepatitis B vaccine).  You have HIV or AIDS.  You use needles to inject street drugs.  You live with, or have sex with, someone who has hepatitis B.  You are a man who has sex with other men (MSM).  You get hemodialysis treatment.  You take certain medicines for conditions like cancer, organ transplantation, and autoimmune conditions.  Hepatitis C blood testing is recommended for all people born from [birth year] through 1965 and any individual with known risk factors for  hepatitis C.  Healthy men should no longer receive prostate-specific antigen (PSA) blood tests as part of routine cancer screening. Talk to your health care provider about prostate cancer screening.  Testicular cancer screening is not recommended for adolescents or adult males who have no symptoms. Screening includes self-exam, a health care provider exam, and other screening tests. Consult with your health care provider about any symptoms you have or any concerns you have about testicular cancer.  Practice safe sex. Use condoms and avoid high-risk sexual practices to reduce the spread of sexually transmitted infections (STIs).  You should be screened for STIs, including gonorrhea and chlamydia if:  You are sexually active and are younger than 24 years.  You are older than 24 years, and your health care provider tells you that you are at risk for this type of infection.  Your sexual activity has changed since you were last screened, and you are at an increased risk for chlamydia or gonorrhea. Ask your health care provider if you are at risk.  If you are at risk of being infected with  HIV, it is recommended that you take a prescription medicine daily to prevent HIV infection. This is called pre-exposure prophylaxis (PrEP). You are considered at risk if:  You are a man who has sex with other men (MSM).  You are a heterosexual man who is sexually active with multiple partners.  You take drugs by injection.  You are sexually active with a partner who has HIV.  Talk with your health care provider about whether you are at high risk of being infected with HIV. If you choose to begin PrEP, you should first be tested for HIV. You should then be tested every 3 months for as long as you are taking PrEP.  Use sunscreen. Apply sunscreen liberally and repeatedly throughout the day. You should seek shade when your shadow is shorter than you. Protect yourself by wearing long sleeves, pants, a wide-brimmed hat, and sunglasses year round whenever you are outdoors.  Tell your health care provider of new moles or changes in moles, especially if there is a change in shape or color. Also, tell your health care provider if a mole is larger than the size of a pencil eraser.  A one-time screening for abdominal aortic aneurysm (AAA) and surgical repair of large AAAs by ultrasound is recommended for men aged 68-75 years who are current or former smokers.  Stay current with your vaccines (immunizations).   This information is not intended to replace advice given to you by your health care provider. Make sure you discuss any questions you have with your health care provider.   Document Released: 06/14/2008 Document Revised: 01/07/2015 Document Reviewed: 05/14/2011 Elsevier Interactive Patient Education [employer].

## 2016-05-21 NOTE — Progress Notes (Signed)
Pre visit review using our clinic review tool, if applicable. No additional management support is needed unless otherwise documented below in the visit note. 

## 2016-05-21 NOTE — Assessment & Plan Note (Signed)
1) Anticipatory Guidance: Discussed importance of wearing a seatbelt while driving and not texting while driving; changing batteries in smoke detector at least once annually; wearing suntan lotion when outside; eating a balanced and moderate diet; getting physical activity at least 30 minutes per day.  2) Immunizations / Screenings / Labs:  Tetanus and Pneuovax updated today. Declines Zostavax. All other immunizations are up to date per recommendations. Due for a colonoscopy which he says he will check with his GI doctor. All other screenings are up to date per recommendations. Previous blood work reviewed and will obtain lipid profile.   Overall well exam with risk factors for cardiovascular disease including overweight and type 1 diabetes. Recommended weight loss of approximately 5-10% of current body weight through nutrition and physical activity. Recommend increasing physical activity to 30 minutes of moderate level activity daily. Encourage nutritional intake that focuses on nutrient dense foods and is moderate, varied, and balanced and is low in saturated fats and processed/sugary foods. Continue diabetes management per endocrinology. Continue other healthy lifestyle behaviors and choices. Follow-up prevention exam in 1 year. Follow-up office visit for chronic conditions as necessary.

## 2016-05-22 ENCOUNTER — Other Ambulatory Visit (INDEPENDENT_AMBULATORY_CARE_PROVIDER_SITE_OTHER): Payer: Medicare Other

## 2016-05-22 ENCOUNTER — Telehealth: Payer: Self-pay | Admitting: Family

## 2016-05-22 DIAGNOSIS — R7989 Other specified abnormal findings of blood chemistry: Secondary | ICD-10-CM

## 2016-05-22 DIAGNOSIS — Z0000 Encounter for general adult medical examination without abnormal findings: Secondary | ICD-10-CM | POA: Diagnosis not present

## 2016-05-22 LAB — LIPID PANEL
CHOL/HDL RATIO: 3
Cholesterol: 158 mg/dL (ref 0–200)
HDL: 46 mg/dL (ref >39.00)
NONHDL: 111.76
TRIGLYCERIDES: 299 mg/dL — AB (ref 0.0–149.0)
VLDL: 59.8 mg/dL — ABNORMAL HIGH (ref 0.0–40.0)

## 2016-05-22 LAB — LDL CHOLESTEROL, DIRECT: Direct LDL: 72 mg/dL

## 2016-05-22 NOTE — Telephone Encounter (Signed)
Please inform patient that his blood work appears stable. His triglycerides remain elevated. Please continue to take the medications as prescribed and work on decreasing saturated fats and sugary/processed foods in his diet. Follow up in 6 months or sooner if needed.

## 2016-05-23 ENCOUNTER — Ambulatory Visit (INDEPENDENT_AMBULATORY_CARE_PROVIDER_SITE_OTHER): Payer: Medicare Other | Admitting: Endocrinology

## 2016-05-23 VITALS — BP 138/80 | HR 62 | Ht 64.0 in | Wt 168.0 lb

## 2016-05-23 DIAGNOSIS — E10319 Type 1 diabetes mellitus with unspecified diabetic retinopathy without macular edema: Secondary | ICD-10-CM

## 2016-05-23 MED ORDER — INSULIN DETEMIR 100 UNIT/ML ~~LOC~~ SOLN: 110.0000 [IU] | SUBCUTANEOUS | Status: DC

## 2016-05-23 NOTE — Progress Notes (Signed)
Subjective:    Patient ID: Andre Stout, male    DOB: 02-22-1940, 76 y.o.   MRN: [id number]  HPI Pt returns for f/u of diabetes mellitus: DM type: 1 Dx'ed: 0000000 Complications: retinopathy.   Therapy: insulin since 2006 DKA: once, in 2017 Severe hypoglycemia: never.  Pancreatitis: never Other: he has chosen a qd insulin regimen; he had to d/c pioglitizone in 2016, due to edema; on lantus, he had am hypoglycemia, and PM hyperglycemia; on 75/25, he had the opposite.  Interval history: no cbg record, but states cbg's vary from 80-300.  It is lowest when lunch is missed or delayed.  He says he never misses the insulin. Past Medical History  Diagnosis Date  . Diabetes mellitus without complication ([place])   . Prostate cancer ([place])     Been 3-4 years ago    Past Surgical History  Procedure Laterality Date  . Cataract extraction Bilateral     Social History   Social History  . Marital Status: Widowed    Spouse Name: N/A  . Number of Children: 1  . Years of Education: 12   Occupational History  . Retired    Social History Main Topics  . Smoking status: Former Smoker -- 0.50 packs/day for 4 years    Types: Cigarettes    Quit date: 10/29/1974  . Smokeless tobacco: Never Used  . Alcohol Use: No  . Drug Use: No  . Sexual Activity: Not Currently   Other Topics Concern  . Not on file   Social History Narrative   Born and raised in [place], [place]. Currently reside in a private residence by himself. Daughter lives in the area. No live. Fun: hunt and fish   Denies religious beliefs that would effect health care.     Current Outpatient Prescriptions on File Prior to Visit  Medication Sig Dispense Refill  . ALPHAGAN P 0.15 % ophthalmic solution Place 1 drop into the left eye 2 (two) times daily.     . Cholecalciferol (VITAMIN D3) 2000 UNITS TABS Take 1 tablet by mouth daily.     Marland Kitchen Cod Liver Oil 1000 MG CAPS Take 1 capsule by mouth daily.     . COMBIGAN 0.2-0.5 % ophthalmic  solution Place 1 drop into both eyes 2 (two) times daily.  12  . enalapril (VASOTEC) 5 MG tablet Take 1 tablet (5 mg total) by mouth 2 (two) times daily. 180 tablet 3  . glucose blood (ONE TOUCH ULTRA TEST) test strip Use to check blood sugar 4 times per day. 150 each 5  . Insulin Syringe-Needle U-100 (INSULIN SYRINGE .3CC/30GX5/16") 30G X 5/16" 0.3 ML MISC Use to inject 3 times per day. 100 each 0  . Misc Natural Products (URINOZINC PO) Take 1 tablet by mouth daily.     . Multiple Vitamins-Minerals (CENTRUM ADULTS PO) Take 1 tablet by mouth daily.     . Omega 3 1200 MG CAPS Take 1 capsule by mouth daily.     Glory Rosebush DELICA LANCETS 99991111 MISC Use to check blood sugar 4 times per day. Dx code: E11.9 200 each 2  . PRESCRIPTION MEDICATION Gets injections in eyes at eye dr's office     No current facility-administered medications on file prior to visit.    No Known Allergies  Family History  Problem Relation Age of Onset  . Diabetes Father   . Diabetes Paternal Grandfather     BP 138/80 mmHg  Pulse 62  Ht 5\' 4"  (1.626 m)  Wt 168 lb (76.204 kg)  BMI 28.82 kg/m2  SpO2 96%  Review of Systems He denies hypoglycemia    Objective:   Physical Exam VITAL SIGNS:  See vs page GENERAL: no distress Pulses: dorsalis pedis intact bilat.   MSK: no deformity of the feet CV: no leg edema Skin:  no ulcer on the feet.  normal color and temp on the feet. Neuro: sensation is intact to touch on the feet  Lab Results  Component Value Date   HGBA1C 10.4 04/23/2016      Assessment & Plan:  DM: he needs increased rx.    Patient is advised the following: Patient Instructions  check your blood sugar twice a day.  vary the time of day when you check, between before the 3 meals, and at bedtime.  also check if you have symptoms of your blood sugar being too high or too low.  please keep a record of the readings and bring it to your next appointment here.  You can write it on any piece of paper.   please call us sooner if your blood sugar goes below 70, or if you have a lot of readings over 200.   Please increase the levemir to 110 units each morning.   On this type of insulin schedule, you should eat meals on a regular schedule.  If a meal is missed or significantly delayed, your blood sugar could go low.   Please come back for a follow-up appointment in 3 months.

## 2016-05-23 NOTE — Telephone Encounter (Signed)
Pt aware of results 

## 2016-05-23 NOTE — Patient Instructions (Addendum)
check your blood sugar twice a day.  vary the time of day when you check, between before the 3 meals, and at bedtime.  also check if you have symptoms of your blood sugar being too high or too low.  please keep a record of the readings and bring it to your next appointment here.  You can write it on any piece of paper.  please call us sooner if your blood sugar goes below 70, or if you have a lot of readings over 200.   Please increase the levemir to 110 units each morning.   On this type of insulin schedule, you should eat meals on a regular schedule.  If a meal is missed or significantly delayed, your blood sugar could go low.   Please come back for a follow-up appointment in 3 months.

## 2016-06-18 ENCOUNTER — Encounter (INDEPENDENT_AMBULATORY_CARE_PROVIDER_SITE_OTHER): Payer: Medicare Other | Admitting: Ophthalmology

## 2016-06-18 DIAGNOSIS — I10 Essential (primary) hypertension: Secondary | ICD-10-CM | POA: Diagnosis not present

## 2016-06-18 DIAGNOSIS — E113512 Type 2 diabetes mellitus with proliferative diabetic retinopathy with macular edema, left eye: Secondary | ICD-10-CM

## 2016-06-18 DIAGNOSIS — E11311 Type 2 diabetes mellitus with unspecified diabetic retinopathy with macular edema: Secondary | ICD-10-CM | POA: Diagnosis not present

## 2016-06-18 DIAGNOSIS — H43813 Vitreous degeneration, bilateral: Secondary | ICD-10-CM

## 2016-06-18 DIAGNOSIS — H35033 Hypertensive retinopathy, bilateral: Secondary | ICD-10-CM

## 2016-06-18 DIAGNOSIS — E113591 Type 2 diabetes mellitus with proliferative diabetic retinopathy without macular edema, right eye: Secondary | ICD-10-CM

## 2016-06-23 ENCOUNTER — Other Ambulatory Visit: Payer: Self-pay | Admitting: Endocrinology

## 2016-07-23 ENCOUNTER — Encounter (INDEPENDENT_AMBULATORY_CARE_PROVIDER_SITE_OTHER): Payer: Medicare Other | Admitting: Ophthalmology

## 2016-08-04 ENCOUNTER — Other Ambulatory Visit: Payer: Self-pay | Admitting: Endocrinology

## 2016-08-06 ENCOUNTER — Encounter (INDEPENDENT_AMBULATORY_CARE_PROVIDER_SITE_OTHER): Payer: Medicare Other | Admitting: Ophthalmology

## 2016-08-06 DIAGNOSIS — E11311 Type 2 diabetes mellitus with unspecified diabetic retinopathy with macular edema: Secondary | ICD-10-CM

## 2016-08-06 DIAGNOSIS — E113512 Type 2 diabetes mellitus with proliferative diabetic retinopathy with macular edema, left eye: Secondary | ICD-10-CM

## 2016-08-06 DIAGNOSIS — E113591 Type 2 diabetes mellitus with proliferative diabetic retinopathy without macular edema, right eye: Secondary | ICD-10-CM | POA: Diagnosis not present

## 2016-08-06 DIAGNOSIS — I10 Essential (primary) hypertension: Secondary | ICD-10-CM

## 2016-08-06 DIAGNOSIS — H35033 Hypertensive retinopathy, bilateral: Secondary | ICD-10-CM | POA: Diagnosis not present

## 2016-08-06 DIAGNOSIS — H43813 Vitreous degeneration, bilateral: Secondary | ICD-10-CM

## 2016-08-23 ENCOUNTER — Encounter: Payer: Self-pay | Admitting: Endocrinology

## 2016-08-23 ENCOUNTER — Ambulatory Visit (INDEPENDENT_AMBULATORY_CARE_PROVIDER_SITE_OTHER): Payer: Medicare Other | Admitting: Endocrinology

## 2016-08-23 VITALS — BP 132/76 | HR 66 | Ht 65.0 in | Wt 173.0 lb

## 2016-08-23 DIAGNOSIS — E0800 Diabetes mellitus due to underlying condition with hyperosmolarity without nonketotic hyperglycemic-hyperosmolar coma (NKHHC): Secondary | ICD-10-CM

## 2016-08-23 LAB — POCT GLYCOSYLATED HEMOGLOBIN (HGB A1C): HEMOGLOBIN A1C: 9.5

## 2016-08-23 MED ORDER — INSULIN DETEMIR 100 UNIT/ML ~~LOC~~ SOLN: 130.0000 [IU] | SUBCUTANEOUS | 11 refills | Status: DC

## 2016-08-23 NOTE — Progress Notes (Signed)
Subjective:    Patient ID: Andre Stout, male    DOB: [birth year], 76 y.o.   MRN: [id number]  HPI Pt returns for f/u of diabetes mellitus: DM type: 1 Dx'ed: 0000000 Complications: retinopathy.   Therapy: insulin since 2006 DKA: once, in 2017 Severe hypoglycemia: never.  Pancreatitis: never Other: he has chosen a qd insulin regimen; he had to d/c pioglitizone in 2016, due to edema; on lantus, he had am hypoglycemia, and PM hyperglycemia; on 75/25, he had the opposite.   Interval history: no cbg record, but states cbg's vary from 40-200's.  It is lowest in the afternoon, when lunch is missed or delayed.  He says he never misses the insulin.  Past Medical History:  Diagnosis Date  . Diabetes mellitus without complication ([place])   . Prostate cancer ([place])    Been 3-4 years ago    Past Surgical History:  Procedure Laterality Date  . CATARACT EXTRACTION Bilateral     Social History   Social History  . Marital status: Widowed    Spouse name: N/A  . Number of children: 1  . Years of education: 74   Occupational History  . Retired    Social History Main Topics  . Smoking status: Former Smoker    Packs/day: 0.50    Years: 4.00    Types: Cigarettes    Quit date: 10/29/1974  . Smokeless tobacco: Never Used  . Alcohol use No  . Drug use: No  . Sexual activity: Not Currently   Other Topics Concern  . Not on file   Social History Narrative   Born and raised in [place], [place]. Currently reside in a private residence by himself. Daughter lives in the area. No live. Fun: hunt and fish   Denies religious beliefs that would effect health care.     Current Outpatient Prescriptions on File Prior to Visit  Medication Sig Dispense Refill  . ALPHAGAN P 0.15 % ophthalmic solution Place 1 drop into the left eye 2 (two) times daily.     . Cholecalciferol (VITAMIN D3) 2000 UNITS TABS Take 1 tablet by mouth daily.     Marland Kitchen Cod Liver Oil 1000 MG CAPS Take 1 capsule by mouth daily.     .  COMBIGAN 0.2-0.5 % ophthalmic solution Place 1 drop into both eyes 2 (two) times daily.  12  . enalapril (VASOTEC) 5 MG tablet Take 1 tablet (5 mg total) by mouth 2 (two) times daily. 180 tablet 3  . glucose blood (ONE TOUCH ULTRA TEST) test strip Use to check blood sugar 4 times per day. 150 each 5  . Insulin Syringe-Needle U-100 (INSULIN SYRINGE .3CC/30GX5/16") 30G X 5/16" 0.3 ML MISC Use to inject 3 times per day. 100 each 0  . Misc Natural Products (URINOZINC PO) Take 1 tablet by mouth daily.     . Multiple Vitamins-Minerals (CENTRUM ADULTS PO) Take 1 tablet by mouth daily.     . Omega 3 1200 MG CAPS Take 1 capsule by mouth daily.     Glory Rosebush DELICA LANCETS 99991111 MISC Use to check blood sugar 4 times per day. Dx code: E11.9 200 each 2  . PRESCRIPTION MEDICATION Gets injections in eyes at eye dr's office     No current facility-administered medications on file prior to visit.     No Known Allergies  Family History  Problem Relation Age of Onset  . Diabetes Father   . Diabetes Paternal Grandfather     BP 132/76  Pulse 66   Ht 5\' 5"  (1.651 m)   Wt 173 lb (78.5 kg)   BMI 28.79 kg/m    Review of Systems He denies LOC.      Objective:   Physical Exam VITAL SIGNS:  See vs page GENERAL: no distress Pulses: dorsalis pedis intact bilat.   MSK: no deformity of the feet CV: no leg edema.   Skin:  no ulcer on the feet.  normal color and temp on the feet. Neuro: sensation is intact to touch on the feet.     A1c=9.5%    Assessment & Plan:  Insulin-requiring type 2 DM: he needs increased rx.

## 2016-08-23 NOTE — Patient Instructions (Addendum)
check your blood sugar twice a day.  vary the time of day when you check, between before the 3 meals, and at bedtime.  also check if you have symptoms of your blood sugar being too high or too low.  please keep a record of the readings and bring it to your next appointment here.  You can write it on any piece of paper.  please call us sooner if your blood sugar goes below 70, or if you have a lot of readings over 200.   Please increase the levemir to 130 units each morning.   On this type of insulin schedule, you should eat meals on a regular schedule, especially lunch.  If a meal is missed or significantly delayed, your blood sugar could go low.   Please come back for a follow-up appointment in 2 months.

## 2016-08-24 ENCOUNTER — Ambulatory Visit: Payer: Medicare Other | Admitting: Endocrinology

## 2016-08-26 ENCOUNTER — Other Ambulatory Visit: Payer: Self-pay | Admitting: Endocrinology

## 2016-09-04 ENCOUNTER — Telehealth: Payer: Self-pay | Admitting: Endocrinology

## 2016-09-04 NOTE — Telephone Encounter (Signed)
[facility] asking if we have received the paperwork for the pt's right knee brace  Let them know he is not the PCP to send to [facility]

## 2016-09-17 ENCOUNTER — Encounter (INDEPENDENT_AMBULATORY_CARE_PROVIDER_SITE_OTHER): Payer: Medicare Other | Admitting: Ophthalmology

## 2016-09-17 DIAGNOSIS — H35033 Hypertensive retinopathy, bilateral: Secondary | ICD-10-CM | POA: Diagnosis not present

## 2016-09-17 DIAGNOSIS — E11311 Type 2 diabetes mellitus with unspecified diabetic retinopathy with macular edema: Secondary | ICD-10-CM

## 2016-09-17 DIAGNOSIS — H43813 Vitreous degeneration, bilateral: Secondary | ICD-10-CM

## 2016-09-17 DIAGNOSIS — H35371 Puckering of macula, right eye: Secondary | ICD-10-CM

## 2016-09-17 DIAGNOSIS — E113513 Type 2 diabetes mellitus with proliferative diabetic retinopathy with macular edema, bilateral: Secondary | ICD-10-CM | POA: Diagnosis not present

## 2016-09-17 DIAGNOSIS — I10 Essential (primary) hypertension: Secondary | ICD-10-CM | POA: Diagnosis not present

## 2016-09-24 ENCOUNTER — Other Ambulatory Visit: Payer: Self-pay | Admitting: Endocrinology

## 2016-10-24 ENCOUNTER — Encounter: Payer: Self-pay | Admitting: Endocrinology

## 2016-10-24 ENCOUNTER — Ambulatory Visit (INDEPENDENT_AMBULATORY_CARE_PROVIDER_SITE_OTHER): Payer: Medicare Other | Admitting: Endocrinology

## 2016-10-24 VITALS — BP 134/84 | HR 59 | Ht 64.0 in | Wt 176.0 lb

## 2016-10-24 DIAGNOSIS — E0800 Diabetes mellitus due to underlying condition with hyperosmolarity without nonketotic hyperglycemic-hyperosmolar coma (NKHHC): Secondary | ICD-10-CM

## 2016-10-24 LAB — POCT GLYCOSYLATED HEMOGLOBIN (HGB A1C): HEMOGLOBIN A1C: 8.8

## 2016-10-24 MED ORDER — INSULIN DETEMIR 100 UNIT/ML ~~LOC~~ SOLN: 150.0000 [IU] | SUBCUTANEOUS | 0 refills | Status: DC

## 2016-10-24 NOTE — Progress Notes (Signed)
Subjective:    Patient ID: Andre Stout, male    DOB: Dec 12, 1940, 76 y.o.   MRN: [id number]  HPI Pt returns for f/u of diabetes mellitus: DM type: 1 Dx'ed: 0000000 Complications: retinopathy.   Therapy: insulin since 2006 DKA: once, in 2017 Severe hypoglycemia: never.  Pancreatitis: never Other: he has chosen a qd insulin regimen; he had to d/c pioglitizone in 2016, due to edema; on lantus, he had am hypoglycemia, and PM hyperglycemia; on 75/25, he had the opposite.   Interval history: no cbg record, but states cbg's vary from 67-200's.  It is still lowest in the afternoon, when lunch is missed or delayed.  He says cbg is highest at hs, and in AM.  He says he never misses the insulin.   Past Medical History:  Diagnosis Date  . Diabetes mellitus without complication ([place])   . Prostate cancer ([place])    Been 3-4 years ago    Past Surgical History:  Procedure Laterality Date  . CATARACT EXTRACTION Bilateral     Social History   Social History  . Marital status: Widowed    Spouse name: N/A  . Number of children: 1  . Years of education: 65   Occupational History  . Retired    Social History Main Topics  . Smoking status: Former Smoker    Packs/day: 0.50    Years: 4.00    Types: Cigarettes    Quit date: 10/29/1974  . Smokeless tobacco: Never Used  . Alcohol use No  . Drug use: No  . Sexual activity: Not Currently   Other Topics Concern  . Not on file   Social History Narrative   Born and raised in [place], [place]. Currently reside in a private residence by himself. Daughter lives in the area. No live. Fun: hunt and fish   Denies religious beliefs that would effect health care.     Current Outpatient Prescriptions on File Prior to Visit  Medication Sig Dispense Refill  . ALPHAGAN P 0.15 % ophthalmic solution Place 1 drop into the left eye 2 (two) times daily.     . Cholecalciferol (VITAMIN D3) 2000 UNITS TABS Take 1 tablet by mouth daily.     Marland Kitchen Cod Liver Oil 1000  MG CAPS Take 1 capsule by mouth daily.     . COMBIGAN 0.2-0.5 % ophthalmic solution Place 1 drop into both eyes 2 (two) times daily.  12  . enalapril (VASOTEC) 5 MG tablet Take 1 tablet (5 mg total) by mouth 2 (two) times daily. 180 tablet 3  . glucose blood (ONE TOUCH ULTRA TEST) test strip Use to check blood sugar 4 times per day. 150 each 5  . Insulin Syringe-Needle U-100 (INSULIN SYRINGE .3CC/30GX5/16") 30G X 5/16" 0.3 ML MISC Use to inject 3 times per day. 100 each 0  . Misc Natural Products (URINOZINC PO) Take 1 tablet by mouth daily.     . Multiple Vitamins-Minerals (CENTRUM ADULTS PO) Take 1 tablet by mouth daily.     . Omega 3 1200 MG CAPS Take 1 capsule by mouth daily.     Glory Rosebush DELICA LANCETS 99991111 MISC Use to check blood sugar 4 times per day. Dx code: E11.9 200 each 2  . PRESCRIPTION MEDICATION Gets injections in eyes at eye dr's office     No current facility-administered medications on file prior to visit.     No Known Allergies  Family History  Problem Relation Age of Onset  . Diabetes Father   .  Diabetes Paternal Grandfather     BP 134/84   Pulse (!) 59   Ht 5\' 4"  (1.626 m)   Wt 176 lb (79.8 kg)   SpO2 96%   BMI 30.21 kg/m    Review of Systems Denies LOC    Objective:   Physical Exam VITAL SIGNS:  See vs page GENERAL: no distress Pulses: dorsalis pedis intact bilat.   MSK: no deformity of the feet CV: no leg edema Skin:  no ulcer on the feet.  normal color and temp on the feet. Neuro: sensation is intact to touch on the feet Ext: There is bilateral onychomycosis of the toenails.     A1c=8.8%    Assessment & Plan:  Type 1 DM, with retinopathy: He needs increased rx.  he declines to take insulin BID, or to take multiple daily injections.   Patient is advised the following: Patient Instructions  check your blood sugar twice a day.  vary the time of day when you check, between before the 3 meals, and at bedtime.  also check if you have symptoms of  your blood sugar being too high or too low.  please keep a record of the readings and bring it to your next appointment here.  You can write it on any piece of paper.  please call us sooner if your blood sugar goes below 70, or if you have a lot of readings over 200.   Please increase the levemir to 150 units each morning.  On this type of insulin schedule, you should eat meals on a regular schedule, especially lunch.  If a meal is missed or significantly delayed, your blood sugar could go low.   Please come back for a follow-up appointment in 3 months.

## 2016-10-24 NOTE — Patient Instructions (Addendum)
check your blood sugar twice a day.  vary the time of day when you check, between before the 3 meals, and at bedtime.  also check if you have symptoms of your blood sugar being too high or too low.  please keep a record of the readings and bring it to your next appointment here.  You can write it on any piece of paper.  please call us sooner if your blood sugar goes below 70, or if you have a lot of readings over 200.   Please increase the levemir to 150 units each morning.  On this type of insulin schedule, you should eat meals on a regular schedule, especially lunch.  If a meal is missed or significantly delayed, your blood sugar could go low.   Please come back for a follow-up appointment in 3 months.

## 2016-10-26 ENCOUNTER — Other Ambulatory Visit: Payer: Self-pay | Admitting: Endocrinology

## 2016-10-29 ENCOUNTER — Encounter (INDEPENDENT_AMBULATORY_CARE_PROVIDER_SITE_OTHER): Payer: Medicare Other | Admitting: Ophthalmology

## 2016-10-29 DIAGNOSIS — E11311 Type 2 diabetes mellitus with unspecified diabetic retinopathy with macular edema: Secondary | ICD-10-CM | POA: Diagnosis not present

## 2016-10-29 DIAGNOSIS — I10 Essential (primary) hypertension: Secondary | ICD-10-CM

## 2016-10-29 DIAGNOSIS — E113513 Type 2 diabetes mellitus with proliferative diabetic retinopathy with macular edema, bilateral: Secondary | ICD-10-CM

## 2016-10-29 DIAGNOSIS — H35033 Hypertensive retinopathy, bilateral: Secondary | ICD-10-CM | POA: Diagnosis not present

## 2016-10-29 DIAGNOSIS — H35371 Puckering of macula, right eye: Secondary | ICD-10-CM | POA: Diagnosis not present

## 2016-10-29 DIAGNOSIS — H43813 Vitreous degeneration, bilateral: Secondary | ICD-10-CM | POA: Diagnosis not present

## 2016-11-07 ENCOUNTER — Other Ambulatory Visit: Payer: Self-pay | Admitting: Family

## 2016-11-25 ENCOUNTER — Other Ambulatory Visit: Payer: Self-pay | Admitting: Endocrinology

## 2016-12-10 ENCOUNTER — Other Ambulatory Visit: Payer: Self-pay | Admitting: Endocrinology

## 2016-12-10 ENCOUNTER — Encounter (INDEPENDENT_AMBULATORY_CARE_PROVIDER_SITE_OTHER): Payer: Medicare Other | Admitting: Ophthalmology

## 2016-12-10 DIAGNOSIS — H35371 Puckering of macula, right eye: Secondary | ICD-10-CM | POA: Diagnosis not present

## 2016-12-10 DIAGNOSIS — H43813 Vitreous degeneration, bilateral: Secondary | ICD-10-CM | POA: Diagnosis not present

## 2016-12-10 DIAGNOSIS — H35033 Hypertensive retinopathy, bilateral: Secondary | ICD-10-CM

## 2016-12-10 DIAGNOSIS — E113513 Type 2 diabetes mellitus with proliferative diabetic retinopathy with macular edema, bilateral: Secondary | ICD-10-CM

## 2016-12-10 DIAGNOSIS — I10 Essential (primary) hypertension: Secondary | ICD-10-CM | POA: Diagnosis not present

## 2016-12-17 ENCOUNTER — Telehealth: Payer: Self-pay | Admitting: Endocrinology

## 2016-12-17 NOTE — Telephone Encounter (Signed)
[facility] ask if [facility] could [facility] in a new prescription with dx code for the  [place] TEST test strip  [id number] [place], [place] - [address] [phone] (Phone) [phone] (Fax)

## 2016-12-18 MED ORDER — GLUCOSE BLOOD VI STRP: ORAL_STRIP | 5 refills | Status: DC

## 2016-12-18 NOTE — Telephone Encounter (Signed)
Refill submitted per patient's request.  

## 2017-01-07 ENCOUNTER — Other Ambulatory Visit: Payer: Self-pay | Admitting: Endocrinology

## 2017-01-20 NOTE — Progress Notes (Signed)
Subjective:    Patient ID: Andre Stout, male    DOB: March 29, 1940, 77 y.o.   MRN: [id number]  HPI Pt returns for f/u of diabetes mellitus: DM type: 1 Dx'ed: 0000000 Complications: retinopathy.   Therapy: insulin since 2006 DKA: once, in 2017 Severe hypoglycemia: never.  Pancreatitis: never Other: he has chosen a qd insulin regimen; he had to d/c pioglitizone in 2016, due to edema; on lantus, he had am hypoglycemia, and PM hyperglycemia; on 75/25, he had the opposite.   Interval history: no cbg record, but states cbg's vary from 60-200's.   He says cbg is highest at hs, and in AM.  He says he never misses the insulin.   Past Medical History:  Diagnosis Date  . Diabetes mellitus without complication ([place])   . Prostate cancer ([place])    Been 3-4 years ago    Past Surgical History:  Procedure Laterality Date  . CATARACT EXTRACTION Bilateral     Social History   Social History  . Marital status: Widowed    Spouse name: N/A  . Number of children: 1  . Years of education: 90   Occupational History  . Retired    Social History Main Topics  . Smoking status: Former Smoker    Packs/day: 0.50    Years: 4.00    Types: Cigarettes    Quit date: 10/29/1974  . Smokeless tobacco: Never Used  . Alcohol use No  . Drug use: No  . Sexual activity: Not Currently   Other Topics Concern  . Not on file   Social History Narrative   Born and raised in [place], [place]. Currently reside in a private residence by himself. Daughter lives in the area. No live. Fun: hunt and fish   Denies religious beliefs that would effect health care.     Current Outpatient Prescriptions on File Prior to Visit  Medication Sig Dispense Refill  . ALPHAGAN P 0.15 % ophthalmic solution Place 1 drop into the left eye 2 (two) times daily.     . Cholecalciferol (VITAMIN D3) 2000 UNITS TABS Take 1 tablet by mouth daily.     Marland Kitchen Cod Liver Oil 1000 MG CAPS Take 1 capsule by mouth daily.     . COMBIGAN 0.2-0.5 %  ophthalmic solution Place 1 drop into both eyes 2 (two) times daily.  12  . enalapril (VASOTEC) 5 MG tablet TAKE 1 TABLET TWICE A DAY 180 tablet 3  . glucose blood (ONE TOUCH ULTRA TEST) test strip USE 4 TIMES DAILY Dx code E11.9 200 each 5  . Insulin Syringe-Needle U-100 (INSULIN SYRINGE .3CC/30GX5/16") 30G X 5/16" 0.3 ML MISC Use to inject 3 times per day. 100 each 0  . Misc Natural Products (URINOZINC PO) Take 1 tablet by mouth daily.     . Multiple Vitamins-Minerals (CENTRUM ADULTS PO) Take 1 tablet by mouth daily.     . Omega 3 1200 MG CAPS Take 1 capsule by mouth daily.     Glory Rosebush DELICA LANCETS [id number] MISC Use to check blood sugar 4 times per day. Dx code: E11.9 200 each 2  . PRESCRIPTION MEDICATION Gets injections in eyes at eye dr's office     No current facility-administered medications on file prior to visit.     No Known Allergies  Family History  Problem Relation Age of Onset  . Diabetes Father   . Diabetes Paternal Grandfather     BP (!) 136/94   Pulse 64   Ht 5'  4" (1.626 m)   Wt 179 lb (81.2 kg)   SpO2 97%   BMI 30.73 kg/m    Review of Systems Denies LOC.     Objective:   Physical Exam VITAL SIGNS:  See vs page GENERAL: no distress Pulses: dorsalis pedis intact bilat.   MSK: no deformity of the feet CV: no leg edema Skin:  no ulcer on the feet.  normal color and temp on the feet. Neuro: sensation is intact to touch on the feet Ext: There is bilateral onychomycosis of the toenails.  A1c=8.5%    Assessment & Plan:  Type 1 DM, with retinopathy: He needs increased rx.    Patient is advised the following: Patient Instructions  check your blood sugar twice a day.  vary the time of day when you check, between before the 3 meals, and at bedtime.  also check if you have symptoms of your blood sugar being too high or too low.  please keep a record of the readings and bring it to your next appointment here.  You can write it on any piece of paper.  please call  us sooner if your blood sugar goes below 70, or if you have a lot of readings over 200.   Please increase the levemir to 160 units each morning.   On this type of insulin schedule, you should eat meals on a regular schedule, especially lunch.  If a meal is missed or significantly delayed, your blood sugar could go low.   Please come back for a follow-up appointment in 3 months.

## 2017-01-21 ENCOUNTER — Encounter (INDEPENDENT_AMBULATORY_CARE_PROVIDER_SITE_OTHER): Payer: Medicare Other | Admitting: Ophthalmology

## 2017-01-21 ENCOUNTER — Telehealth: Payer: Self-pay | Admitting: Endocrinology

## 2017-01-21 NOTE — Telephone Encounter (Signed)
levemir is not going to be covered by insurance anymore, they did not give the pt an alternate please advise

## 2017-01-22 NOTE — Telephone Encounter (Signed)
Next ov is in 2 days.  Does pt have enough to get to that date.

## 2017-01-22 NOTE — Telephone Encounter (Signed)
Attempted to reach the patient and discuss message. Patient was not available and did not have a working Advertising account executive. Will try again at a later time.

## 2017-01-22 NOTE — Telephone Encounter (Signed)
See message, ok to send Lantus?

## 2017-01-23 NOTE — Telephone Encounter (Signed)
I contacted the patient and advised of message. He stated he used the last dosage of his medication today.  Please advise, Thanks!

## 2017-01-23 NOTE — Telephone Encounter (Signed)
If I was you, I would hold off until tomorrow's appt.

## 2017-01-24 ENCOUNTER — Ambulatory Visit (INDEPENDENT_AMBULATORY_CARE_PROVIDER_SITE_OTHER): Payer: Medicare Other | Admitting: Endocrinology

## 2017-01-24 ENCOUNTER — Encounter: Payer: Self-pay | Admitting: Endocrinology

## 2017-01-24 VITALS — BP 136/94 | HR 64 | Ht 64.0 in | Wt 179.0 lb

## 2017-01-24 DIAGNOSIS — E0800 Diabetes mellitus due to underlying condition with hyperosmolarity without nonketotic hyperglycemic-hyperosmolar coma (NKHHC): Secondary | ICD-10-CM | POA: Diagnosis not present

## 2017-01-24 LAB — POCT GLYCOSYLATED HEMOGLOBIN (HGB A1C): Hemoglobin A1C: 8.5

## 2017-01-24 MED ORDER — INSULIN DETEMIR 100 UNIT/ML ~~LOC~~ SOLN: 160.0000 [IU] | SUBCUTANEOUS | 3 refills | Status: DC

## 2017-01-24 NOTE — Patient Instructions (Addendum)
check your blood sugar twice a day.  vary the time of day when you check, between before the 3 meals, and at bedtime.  also check if you have symptoms of your blood sugar being too high or too low.  please keep a record of the readings and bring it to your next appointment here.  You can write it on any piece of paper.  please call us sooner if your blood sugar goes below 70, or if you have a lot of readings over 200.   Please increase the levemir to 160 units each morning.   On this type of insulin schedule, you should eat meals on a regular schedule, especially lunch.  If a meal is missed or significantly delayed, your blood sugar could go low.   Please come back for a follow-up appointment in 3 months.

## 2017-01-24 NOTE — Telephone Encounter (Signed)
I contacted the patient and he voiced understanding.

## 2017-01-25 ENCOUNTER — Other Ambulatory Visit: Payer: Self-pay

## 2017-01-25 MED ORDER — INSULIN DETEMIR 100 UNIT/ML ~~LOC~~ SOLN: 160.0000 [IU] | SUBCUTANEOUS | 3 refills | Status: DC

## 2017-01-28 ENCOUNTER — Telehealth: Payer: Self-pay | Admitting: Endocrinology

## 2017-01-28 NOTE — Telephone Encounter (Signed)
I contacted the patient and advised I contacted [facility] about his insulin.I was advised his insulin is ready for shipment on 02/01/2017. The representative advised me the patient could requested a faster delivery, but would need the patient's ok because it would be a extra fee place on the shipment. Patient advised of this info and voiced understanding.

## 2017-01-28 NOTE — Telephone Encounter (Signed)
Pt called in and requested to speak with [facility], he did not specify his reason, just requests a call back.

## 2017-01-29 ENCOUNTER — Telehealth: Payer: Self-pay | Admitting: Endocrinology

## 2017-01-29 NOTE — Telephone Encounter (Signed)
I contacted [facility]. The pharmacy needed to confirm the type of insulin syringes. Info given and no further information was needed at this time.

## 2017-01-29 NOTE — Telephone Encounter (Signed)
Andre Stout from [facility] calling has questions about medication insulin detemir (LEVEMIR) 100 UNIT/ML injection  [id number] Ref # [id number]

## 2017-02-05 ENCOUNTER — Encounter (INDEPENDENT_AMBULATORY_CARE_PROVIDER_SITE_OTHER): Payer: Medicare Other | Admitting: Ophthalmology

## 2017-02-05 DIAGNOSIS — H43813 Vitreous degeneration, bilateral: Secondary | ICD-10-CM

## 2017-02-05 DIAGNOSIS — H35033 Hypertensive retinopathy, bilateral: Secondary | ICD-10-CM | POA: Diagnosis not present

## 2017-02-05 DIAGNOSIS — I10 Essential (primary) hypertension: Secondary | ICD-10-CM | POA: Diagnosis not present

## 2017-02-05 DIAGNOSIS — E113513 Type 2 diabetes mellitus with proliferative diabetic retinopathy with macular edema, bilateral: Secondary | ICD-10-CM

## 2017-02-05 DIAGNOSIS — E11311 Type 2 diabetes mellitus with unspecified diabetic retinopathy with macular edema: Secondary | ICD-10-CM | POA: Diagnosis not present

## 2017-03-19 ENCOUNTER — Encounter (INDEPENDENT_AMBULATORY_CARE_PROVIDER_SITE_OTHER): Payer: Medicare Other | Admitting: Ophthalmology

## 2017-03-19 DIAGNOSIS — H35033 Hypertensive retinopathy, bilateral: Secondary | ICD-10-CM

## 2017-03-19 DIAGNOSIS — E113513 Type 2 diabetes mellitus with proliferative diabetic retinopathy with macular edema, bilateral: Secondary | ICD-10-CM

## 2017-03-19 DIAGNOSIS — I10 Essential (primary) hypertension: Secondary | ICD-10-CM | POA: Diagnosis not present

## 2017-03-19 DIAGNOSIS — H43813 Vitreous degeneration, bilateral: Secondary | ICD-10-CM

## 2017-03-19 DIAGNOSIS — E11311 Type 2 diabetes mellitus with unspecified diabetic retinopathy with macular edema: Secondary | ICD-10-CM

## 2017-04-24 ENCOUNTER — Encounter: Payer: Self-pay | Admitting: Endocrinology

## 2017-04-24 ENCOUNTER — Ambulatory Visit (INDEPENDENT_AMBULATORY_CARE_PROVIDER_SITE_OTHER): Payer: Medicare Other | Admitting: Endocrinology

## 2017-04-24 VITALS — BP 148/90 | HR 66 | Ht 64.0 in | Wt 178.0 lb

## 2017-04-24 DIAGNOSIS — E0800 Diabetes mellitus due to underlying condition with hyperosmolarity without nonketotic hyperglycemic-hyperosmolar coma (NKHHC): Secondary | ICD-10-CM

## 2017-04-24 LAB — POCT GLYCOSYLATED HEMOGLOBIN (HGB A1C): HEMOGLOBIN A1C: 8.9

## 2017-04-24 MED ORDER — INSULIN DETEMIR 100 UNIT/ML ~~LOC~~ SOLN: 180.0000 [IU] | SUBCUTANEOUS | 3 refills | Status: DC

## 2017-04-24 NOTE — Patient Instructions (Addendum)
check your blood sugar twice a day.  vary the time of day when you check, between before the 3 meals, and at bedtime.  also check if you have symptoms of your blood sugar being too high or too low.  please keep a record of the readings and bring it to your next appointment here.  You can write it on any piece of paper.  please call us sooner if your blood sugar goes below 70, or if you have a lot of readings over 200.   Please increase the levemir to 180 units each morning.   On this type of insulin schedule, you should eat meals on a regular schedule, especially lunch.  If a meal is missed or significantly delayed, your blood sugar could go low.   Please come back for a follow-up appointment in 2-3 months.

## 2017-04-24 NOTE — Progress Notes (Signed)
Subjective:    Patient ID: Andre Stout, male    DOB: 1940/09/30, 77 y.o.   MRN: [id number]  HPI Pt returns for f/u of diabetes mellitus: DM type: 1 Dx'ed: 7026 Complications: retinopathy.   Therapy: insulin since 2006 DKA: once, in 2017 Severe hypoglycemia: never.  Pancreatitis: never Other: he declines multiple daily injections; he had to d/c pioglitizone in 2016, due to edema; on lantus, he had am hypoglycemia, and PM hyperglycemia; on 75/25, he had the opposite.   Interval history: no cbg record, but states cbg is highest at hs, and in AM (200's).  He says he never misses the insulin.  pt states he feels well in general.   Past Medical History:  Diagnosis Date  . Diabetes mellitus without complication ([place])   . Prostate cancer ([place])    Been 3-4 years ago    Past Surgical History:  Procedure Laterality Date  . CATARACT EXTRACTION Bilateral     Social History   Social History  . Marital status: Widowed    Spouse name: N/A  . Number of children: 1  . Years of education: 52   Occupational History  . Retired    Social History Main Topics  . Smoking status: Former Smoker    Packs/day: 0.50    Years: 4.00    Types: Cigarettes    Quit date: 10/29/1974  . Smokeless tobacco: Never Used  . Alcohol use No  . Drug use: No  . Sexual activity: Not Currently   Other Topics Concern  . Not on file   Social History Narrative   Born and raised in [place], [place]. Currently reside in a private residence by himself. Daughter lives in the area. No live. Fun: hunt and fish   Denies religious beliefs that would effect health care.     Current Outpatient Prescriptions on File Prior to Visit  Medication Sig Dispense Refill  . ALPHAGAN P 0.15 % ophthalmic solution Place 1 drop into the left eye 2 (two) times daily.     . Cholecalciferol (VITAMIN D3) 2000 UNITS TABS Take 1 tablet by mouth daily.     Marland Kitchen Cod Liver Oil 1000 MG CAPS Take 1 capsule by mouth daily.     . COMBIGAN  0.2-0.5 % ophthalmic solution Place 1 drop into both eyes 2 (two) times daily.  12  . enalapril (VASOTEC) 5 MG tablet TAKE 1 TABLET TWICE A DAY 180 tablet 3  . glucose blood (ONE TOUCH ULTRA TEST) test strip USE 4 TIMES DAILY Dx code E11.9 200 each 5  . Insulin Syringe-Needle U-100 (INSULIN SYRINGE .3CC/30GX5/16") 30G X 5/16" 0.3 ML MISC Use to inject 3 times per day. 100 each 0  . Misc Natural Products (URINOZINC PO) Take 1 tablet by mouth daily.     . Multiple Vitamins-Minerals (CENTRUM ADULTS PO) Take 1 tablet by mouth daily.     . Omega 3 1200 MG CAPS Take 1 capsule by mouth daily.     Glory Rosebush DELICA LANCETS 37C MISC Use to check blood sugar 4 times per day. Dx code: E11.9 200 each 2  . PRESCRIPTION MEDICATION Gets injections in eyes at eye dr's office     No current facility-administered medications on file prior to visit.     No Known Allergies  Family History  Problem Relation Age of Onset  . Diabetes Father   . Diabetes Paternal Grandfather     BP (!) 148/90   Pulse 66   Ht 5\' 4"  (  1.626 m)   Wt 178 lb (80.7 kg)   SpO2 96%   BMI 30.55 kg/m    Review of Systems Denies LOC. No weight change.     Objective:   Physical Exam VITAL SIGNS:  See vs page.  GENERAL: no distress.  Pulses: dorsalis pedis intact bilat.   MSK: no deformity of the feet CV: trace bilat leg edema, and bilat ankle vv's Skin:  no ulcer on the feet.  normal color and temp on the feet. Neuro: sensation is intact to touch on the feet.  Ext: There is bilateral onychomycosis of the toenails.    Lab Results  Component Value Date   HGBA1C 8.9 04/24/2017      Assessment & Plan:  Insulin-requiring type 2 DM: worse  Patient Instructions  check your blood sugar twice a day.  vary the time of day when you check, between before the 3 meals, and at bedtime.  also check if you have symptoms of your blood sugar being too high or too low.  please keep a record of the readings and bring it to your next  appointment here.  You can write it on any piece of paper.  please call us sooner if your blood sugar goes below 70, or if you have a lot of readings over 200.   Please increase the levemir to 180 units each morning.   On this type of insulin schedule, you should eat meals on a regular schedule, especially lunch.  If a meal is missed or significantly delayed, your blood sugar could go low.   Please come back for a follow-up appointment in 2-3 months.

## 2017-04-30 ENCOUNTER — Encounter (INDEPENDENT_AMBULATORY_CARE_PROVIDER_SITE_OTHER): Payer: Medicare Other | Admitting: Ophthalmology

## 2017-04-30 DIAGNOSIS — E113513 Type 2 diabetes mellitus with proliferative diabetic retinopathy with macular edema, bilateral: Secondary | ICD-10-CM | POA: Diagnosis not present

## 2017-04-30 DIAGNOSIS — I10 Essential (primary) hypertension: Secondary | ICD-10-CM | POA: Diagnosis not present

## 2017-04-30 DIAGNOSIS — E11311 Type 2 diabetes mellitus with unspecified diabetic retinopathy with macular edema: Secondary | ICD-10-CM

## 2017-04-30 DIAGNOSIS — H43813 Vitreous degeneration, bilateral: Secondary | ICD-10-CM | POA: Diagnosis not present

## 2017-04-30 DIAGNOSIS — H35033 Hypertensive retinopathy, bilateral: Secondary | ICD-10-CM

## 2017-05-21 ENCOUNTER — Telehealth: Payer: Self-pay

## 2017-05-21 NOTE — Telephone Encounter (Signed)
Spoke with Prentiss Bells NP and she was doing a health check and discovered the patients glucose level was 4 + in the urine- patient stated he has not taken his Levemir today- NP requested we call the patient back if there are any changes made to his medication please advise

## 2017-05-21 NOTE — Telephone Encounter (Signed)
Please call back to NP.  Ask her to advise pt to take insulin as rx'ed.

## 2017-05-22 NOTE — Telephone Encounter (Signed)
Spoke with the patient and gave him the advice and informed him that there has been no changes in his medication

## 2017-06-11 ENCOUNTER — Encounter (INDEPENDENT_AMBULATORY_CARE_PROVIDER_SITE_OTHER): Payer: Medicare Other | Admitting: Ophthalmology

## 2017-06-11 DIAGNOSIS — E113591 Type 2 diabetes mellitus with proliferative diabetic retinopathy without macular edema, right eye: Secondary | ICD-10-CM | POA: Diagnosis not present

## 2017-06-11 DIAGNOSIS — H35033 Hypertensive retinopathy, bilateral: Secondary | ICD-10-CM

## 2017-06-11 DIAGNOSIS — E113512 Type 2 diabetes mellitus with proliferative diabetic retinopathy with macular edema, left eye: Secondary | ICD-10-CM

## 2017-06-11 DIAGNOSIS — E11311 Type 2 diabetes mellitus with unspecified diabetic retinopathy with macular edema: Secondary | ICD-10-CM

## 2017-06-11 DIAGNOSIS — H43813 Vitreous degeneration, bilateral: Secondary | ICD-10-CM

## 2017-06-11 DIAGNOSIS — I10 Essential (primary) hypertension: Secondary | ICD-10-CM | POA: Diagnosis not present

## 2017-07-23 ENCOUNTER — Encounter (INDEPENDENT_AMBULATORY_CARE_PROVIDER_SITE_OTHER): Payer: Medicare Other | Admitting: Ophthalmology

## 2017-07-23 DIAGNOSIS — H43813 Vitreous degeneration, bilateral: Secondary | ICD-10-CM

## 2017-07-23 DIAGNOSIS — E11311 Type 2 diabetes mellitus with unspecified diabetic retinopathy with macular edema: Secondary | ICD-10-CM

## 2017-07-23 DIAGNOSIS — H35033 Hypertensive retinopathy, bilateral: Secondary | ICD-10-CM | POA: Diagnosis not present

## 2017-07-23 DIAGNOSIS — E113513 Type 2 diabetes mellitus with proliferative diabetic retinopathy with macular edema, bilateral: Secondary | ICD-10-CM | POA: Diagnosis not present

## 2017-07-23 DIAGNOSIS — I10 Essential (primary) hypertension: Secondary | ICD-10-CM

## 2017-07-24 ENCOUNTER — Ambulatory Visit (INDEPENDENT_AMBULATORY_CARE_PROVIDER_SITE_OTHER): Payer: Medicare Other | Admitting: Endocrinology

## 2017-07-24 ENCOUNTER — Encounter: Payer: Self-pay | Admitting: Endocrinology

## 2017-07-24 VITALS — BP 122/70 | HR 60 | Ht 64.0 in | Wt 171.0 lb

## 2017-07-24 DIAGNOSIS — R208 Other disturbances of skin sensation: Secondary | ICD-10-CM

## 2017-07-24 DIAGNOSIS — E0800 Diabetes mellitus due to underlying condition with hyperosmolarity without nonketotic hyperglycemic-hyperosmolar coma (NKHHC): Secondary | ICD-10-CM | POA: Diagnosis not present

## 2017-07-24 LAB — POCT GLYCOSYLATED HEMOGLOBIN (HGB A1C): HEMOGLOBIN A1C: 10.8

## 2017-07-24 MED ORDER — INSULIN GLARGINE 100 UNIT/ML ~~LOC~~ SOLN: 30.0000 [IU] | Freq: Every day | SUBCUTANEOUS | 11 refills | Status: DC

## 2017-07-24 MED ORDER — INSULIN LISPRO 100 UNIT/ML ~~LOC~~ SOLN: 5.0000 [IU] | Freq: Three times a day (TID) | SUBCUTANEOUS | 11 refills | Status: DC

## 2017-07-24 NOTE — Progress Notes (Signed)
Subjective:    Patient ID: Andre Stout, male    DOB: 15-Feb-1940, 77 y.o.   MRN: [id number]  HPI Pt returns for f/u of diabetes mellitus: DM type: 1 Dx'ed: 1324 Complications: retinopathy.   Therapy: insulin since 2006 DKA: once, in 2017 Severe hypoglycemia: never.  Pancreatitis: never Other: he declines multiple daily injections; he had to d/c pioglitizone in 2016, due to edema; on lantus, he had am hypoglycemia, and PM hyperglycemia; on 75/25, he had the opposite.   Interval history: He says he never misses the insulin.  pt states he feels well in general.  He reduced levemir to 120 units qam, due to hypoglycemia when evening meal was delayed.   Past Medical History:  Diagnosis Date  . Diabetes mellitus without complication ([place])   . Prostate cancer ([place])    Been 3-4 years ago    Past Surgical History:  Procedure Laterality Date  . CATARACT EXTRACTION Bilateral     Social History   Social History  . Marital status: Widowed    Spouse name: N/A  . Number of children: 1  . Years of education: 31   Occupational History  . Retired    Social History Main Topics  . Smoking status: Former Smoker    Packs/day: 0.50    Years: 4.00    Types: Cigarettes    Quit date: 10/29/1974  . Smokeless tobacco: Never Used  . Alcohol use No  . Drug use: No  . Sexual activity: Not Currently   Other Topics Concern  . Not on file   Social History Narrative   Born and raised in [place], [place]. Currently reside in a private residence by himself. Daughter lives in the area. No live. Fun: hunt and fish   Denies religious beliefs that would effect health care.     Current Outpatient Prescriptions on File Prior to Visit  Medication Sig Dispense Refill  . ALPHAGAN P 0.15 % ophthalmic solution Place 1 drop into the left eye 2 (two) times daily.     . Cholecalciferol (VITAMIN D3) 2000 UNITS TABS Take 1 tablet by mouth daily.     Marland Kitchen Cod Liver Oil 1000 MG CAPS Take 1 capsule by mouth  daily.     . COMBIGAN 0.2-0.5 % ophthalmic solution Place 1 drop into both eyes 2 (two) times daily.  12  . enalapril (VASOTEC) 5 MG tablet TAKE 1 TABLET TWICE A DAY 180 tablet 3  . glucose blood (ONE TOUCH ULTRA TEST) test strip USE 4 TIMES DAILY Dx code E11.9 200 each 5  . Insulin Syringe-Needle U-100 (INSULIN SYRINGE .3CC/30GX5/16") 30G X 5/16" 0.3 ML MISC Use to inject 3 times per day. 100 each 0  . Misc Natural Products (URINOZINC PO) Take 1 tablet by mouth daily.     . Multiple Vitamins-Minerals (CENTRUM ADULTS PO) Take 1 tablet by mouth daily.     . Omega 3 1200 MG CAPS Take 1 capsule by mouth daily.     Glory Rosebush DELICA LANCETS 40N MISC Use to check blood sugar 4 times per day. Dx code: E11.9 200 each 2  . PRESCRIPTION MEDICATION Gets injections in eyes at eye dr's office     No current facility-administered medications on file prior to visit.     No Known Allergies  Family History  Problem Relation Age of Onset  . Diabetes Father   . Diabetes Paternal Grandfather     BP 122/70   Pulse 60   Ht 5\' 4"  (  1.626 m)   Wt 171 lb (77.6 kg)   SpO2 97%   BMI 29.35 kg/m   Review of Systems  denies LOC    Objective:   Physical Exam VITAL SIGNS:  See vs page.  GENERAL: no distress.  Pulses: foot pulses are intact bilaterally.   MSK: no deformity of the feet or ankles.  CV: 1+ bilat edema of the legs, and bilat vv's Skin:  no ulcer on the feet or ankles.  normal color and temp on the feet and ankles Neuro: sensation is intact to touch on the feet and ankles.   Ext: There is bilateral onychomycosis of the toenails.  Lab Results  Component Value Date   HGBA1C 10.8 07/24/2017      Assessment & Plan:  Insulin-requiring type 2 DM, with DR: not well-controlled.  he agrees to take multiple daily injections.   Patient Instructions  check your blood sugar twice a day.  vary the time of day when you check, between before the 3 meals, and at bedtime.  also check if you have  symptoms of your blood sugar being too high or too low.  please keep a record of the readings and bring it to your next appointment here.  You can write it on any piece of paper.  please call us sooner if your blood sugar goes below 70, or if you have a lot of readings over 200.   Please increase the levemir to lantus, 30 units daily, and: humalog 5 units 3 times a day (just before each meal) Please call or message [place] next week, to tell us how the blood sugar is doing Please come back for a follow-up appointment in 2 months.

## 2017-07-24 NOTE — Patient Instructions (Addendum)
check your blood sugar twice a day.  vary the time of day when you check, between before the 3 meals, and at bedtime.  also check if you have symptoms of your blood sugar being too high or too low.  please keep a record of the readings and bring it to your next appointment here.  You can write it on any piece of paper.  please call us sooner if your blood sugar goes below 70, or if you have a lot of readings over 200.   Please increase the levemir to lantus, 30 units daily, and: humalog 5 units 3 times a day (just before each meal) Please call or message [place] next week, to tell us how the blood sugar is doing Please come back for a follow-up appointment in 2 months.

## 2017-08-01 ENCOUNTER — Other Ambulatory Visit: Payer: Self-pay

## 2017-08-01 ENCOUNTER — Telehealth: Payer: Self-pay

## 2017-08-01 MED ORDER — INSULIN ASPART 100 UNIT/ML ~~LOC~~ SOLN: 5.0000 [IU] | Freq: Three times a day (TID) | SUBCUTANEOUS | 11 refills | Status: DC

## 2017-08-01 NOTE — Telephone Encounter (Signed)
We have a PA request for Humalog can we switch to Novolog for this patient?

## 2017-08-01 NOTE — Telephone Encounter (Signed)
Sent in new prescription for novolog instead so no PA needed.

## 2017-08-01 NOTE — Telephone Encounter (Signed)
OK 

## 2017-08-06 ENCOUNTER — Telehealth: Payer: Self-pay | Admitting: Endocrinology

## 2017-08-06 NOTE — Telephone Encounter (Signed)
[employer] calling to get PA for Humalog 100 unit for patient. Please call [employer] and advise.     Ref key-  [id number]

## 2017-09-04 ENCOUNTER — Encounter (INDEPENDENT_AMBULATORY_CARE_PROVIDER_SITE_OTHER): Payer: Medicare Other | Admitting: Ophthalmology

## 2017-09-04 DIAGNOSIS — H35033 Hypertensive retinopathy, bilateral: Secondary | ICD-10-CM | POA: Diagnosis not present

## 2017-09-04 DIAGNOSIS — H43813 Vitreous degeneration, bilateral: Secondary | ICD-10-CM

## 2017-09-04 DIAGNOSIS — I10 Essential (primary) hypertension: Secondary | ICD-10-CM | POA: Diagnosis not present

## 2017-09-04 DIAGNOSIS — E11311 Type 2 diabetes mellitus with unspecified diabetic retinopathy with macular edema: Secondary | ICD-10-CM

## 2017-09-04 DIAGNOSIS — E113513 Type 2 diabetes mellitus with proliferative diabetic retinopathy with macular edema, bilateral: Secondary | ICD-10-CM | POA: Diagnosis not present

## 2017-09-24 ENCOUNTER — Encounter: Payer: Self-pay | Admitting: Endocrinology

## 2017-09-24 ENCOUNTER — Ambulatory Visit (INDEPENDENT_AMBULATORY_CARE_PROVIDER_SITE_OTHER): Payer: Medicare Other | Admitting: Endocrinology

## 2017-09-24 VITALS — BP 110/78 | HR 86 | Wt 165.4 lb

## 2017-09-24 DIAGNOSIS — E0800 Diabetes mellitus due to underlying condition with hyperosmolarity without nonketotic hyperglycemic-hyperosmolar coma (NKHHC): Secondary | ICD-10-CM

## 2017-09-24 LAB — POCT GLYCOSYLATED HEMOGLOBIN (HGB A1C): Hemoglobin A1C: 11.9

## 2017-09-24 MED ORDER — INSULIN GLARGINE 100 UNIT/ML ~~LOC~~ SOLN: 40.0000 [IU] | Freq: Every day | SUBCUTANEOUS | 11 refills | Status: DC

## 2017-09-24 NOTE — Patient Instructions (Addendum)
check your blood sugar twice a day.  vary the time of day when you check, between before the 3 meals, and at bedtime.  also check if you have symptoms of your blood sugar being too high or too low.  please keep a record of the readings and bring it to your next appointment here.  You can write it on any piece of paper.  please call us sooner if your blood sugar goes below 70, or if you have a lot of readings over 200.   Please increase the lantus, 40 units daily, and:  Continue humalog 5 units 3 times a day (just before each meal).  Please call or message [place] next week, to tell us how the blood sugar is doing.   Please come back for a follow-up appointment in 2 weeks.

## 2017-09-24 NOTE — Progress Notes (Signed)
Subjective:    Patient ID: Andre Stout, male    DOB: 1940-10-31, 77 y.o.   MRN: [id number]  HPI Pt returns for f/u of diabetes mellitus: DM type: 1 Dx'ed: 3976 Complications: retinopathy.   Therapy: insulin since 2006 DKA: once, in 2017 Severe hypoglycemia: never.  Pancreatitis: never Other: he takes multiple daily injections; he had to d/c pioglitizone in 2016, due to edema; on lantus, he had am hypoglycemia, and PM hyperglycemia; on 75/25, he had the opposite.   Interval history: no cbg record, but states cbg's vary from 200-400.  There is no trend throughout the day.  Pt says he never misses the insulin.   Past Medical History:  Diagnosis Date  . Diabetes mellitus without complication ([place])   . Prostate cancer ([place])    Been 3-4 years ago    Past Surgical History:  Procedure Laterality Date  . CATARACT EXTRACTION Bilateral     Social History   Social History  . Marital status: Widowed    Spouse name: N/A  . Number of children: 1  . Years of education: 14   Occupational History  . Retired    Social History Main Topics  . Smoking status: Former Smoker    Packs/day: 0.50    Years: 4.00    Types: Cigarettes    Quit date: 10/29/1974  . Smokeless tobacco: Never Used  . Alcohol use No  . Drug use: No  . Sexual activity: Not Currently   Other Topics Concern  . Not on file   Social History Narrative   Born and raised in [place], [place]. Currently reside in a private residence by himself. Daughter lives in the area. No live. Fun: hunt and fish   Denies religious beliefs that would effect health care.     Current Outpatient Prescriptions on File Prior to Visit  Medication Sig Dispense Refill  . ALPHAGAN P 0.15 % ophthalmic solution Place 1 drop into the left eye 2 (two) times daily.     . Cholecalciferol (VITAMIN D3) 2000 UNITS TABS Take 1 tablet by mouth daily.     Marland Kitchen Cod Liver Oil 1000 MG CAPS Take 1 capsule by mouth daily.     . COMBIGAN 0.2-0.5 % ophthalmic  solution Place 1 drop into both eyes 2 (two) times daily.  12  . enalapril (VASOTEC) 5 MG tablet TAKE 1 TABLET TWICE A DAY 180 tablet 3  . glucose blood (ONE TOUCH ULTRA TEST) test strip USE 4 TIMES DAILY Dx code E11.9 200 each 5  . insulin aspart (NOVOLOG) 100 UNIT/ML injection Inject 5 Units into the skin 3 (three) times daily before meals. 10 mL 11  . Insulin Syringe-Needle U-100 (INSULIN SYRINGE .3CC/30GX5/16") 30G X 5/16" 0.3 ML MISC Use to inject 3 times per day. 100 each 0  . Misc Natural Products (URINOZINC PO) Take 1 tablet by mouth daily.     . Multiple Vitamins-Minerals (CENTRUM ADULTS PO) Take 1 tablet by mouth daily.     . Omega 3 1200 MG CAPS Take 1 capsule by mouth daily.     Glory Rosebush DELICA LANCETS 73A MISC Use to check blood sugar 4 times per day. Dx code: E11.9 200 each 2  . PRESCRIPTION MEDICATION Gets injections in eyes at eye dr's office     No current facility-administered medications on file prior to visit.     No Known Allergies  Family History  Problem Relation Age of Onset  . Diabetes Father   . Diabetes  Paternal Grandfather     BP 110/78   Pulse 86   Wt 165 lb 6.4 oz (75 kg)   SpO2 96%   BMI 28.39 kg/m   Review of Systems He denies hypoglycemia.      Objective:   Physical Exam VITAL SIGNS:  See vs page.  GENERAL: no distress.  Pulses: foot pulses are intact bilaterally.   MSK: no deformity of the feet or ankles.  CV: 1+ bilat edema of the legs, and bilat vv's Skin:  no ulcer on the feet or ankles.  normal color and temp on the feet and ankles Neuro: sensation is intact to touch on the feet and ankles.   Ext: There is bilateral onychomycosis of the toenails.   Lab Results  Component Value Date   HGBA1C 11.9 09/24/2017      Assessment & Plan:  type 1 DM, with DR: poor control.  We'll see if multiple daily injections can work if we emphasize the lantus.    Patient Instructions  check your blood sugar twice a day.  vary the time of day  when you check, between before the 3 meals, and at bedtime.  also check if you have symptoms of your blood sugar being too high or too low.  please keep a record of the readings and bring it to your next appointment here.  You can write it on any piece of paper.  please call us sooner if your blood sugar goes below 70, or if you have a lot of readings over 200.   Please increase the lantus, 40 units daily, and:  Continue humalog 5 units 3 times a day (just before each meal).  Please call or message [place] next week, to tell us how the blood sugar is doing.   Please come back for a follow-up appointment in 2 weeks.

## 2017-10-08 ENCOUNTER — Ambulatory Visit (INDEPENDENT_AMBULATORY_CARE_PROVIDER_SITE_OTHER): Payer: Medicare Other | Admitting: Endocrinology

## 2017-10-08 ENCOUNTER — Encounter: Payer: Self-pay | Admitting: Endocrinology

## 2017-10-08 VITALS — BP 118/68 | HR 59 | Wt 166.4 lb

## 2017-10-08 DIAGNOSIS — E10319 Type 1 diabetes mellitus with unspecified diabetic retinopathy without macular edema: Secondary | ICD-10-CM | POA: Diagnosis not present

## 2017-10-08 MED ORDER — INSULIN LISPRO 100 UNIT/ML CARTRIDGE: 10.0000 [IU] | Freq: Three times a day (TID) | SUBCUTANEOUS | 11 refills | Status: DC

## 2017-10-08 NOTE — Progress Notes (Signed)
Subjective:    Patient ID: Andre Stout, male    DOB: 09-Apr-1940, 77 y.o.   MRN: [id number]  HPI Pt returns for f/u of diabetes mellitus: DM type: 1 Dx'ed: 1324 Complications: retinopathy.   Therapy: insulin since 2006 DKA: once, in 2017 Severe hypoglycemia: never.  Pancreatitis: never Other: he takes multiple daily injections; he did not tolerate pioglitizone (edema)  Interval history: no cbg record, but states cbg's vary from 85-400.  It is in general higher as the day goes on  Pt says he never misses the insulin.  pt states he feels well in general.   Past Medical History:  Diagnosis Date  . Diabetes mellitus without complication ([place])   . Prostate cancer ([place])    Been 3-4 years ago    Past Surgical History:  Procedure Laterality Date  . CATARACT EXTRACTION Bilateral     Social History   Social History  . Marital status: Widowed    Spouse name: N/A  . Number of children: 1  . Years of education: 33   Occupational History  . Retired    Social History Main Topics  . Smoking status: Former Smoker    Packs/day: 0.50    Years: 4.00    Types: Cigarettes    Quit date: 10/29/1974  . Smokeless tobacco: Never Used  . Alcohol use No  . Drug use: No  . Sexual activity: Not Currently   Other Topics Concern  . Not on file   Social History Narrative   Born and raised in [place], [place]. Currently reside in a private residence by himself. Daughter lives in the area. No live. Fun: hunt and fish   Denies religious beliefs that would effect health care.     Current Outpatient Prescriptions on File Prior to Visit  Medication Sig Dispense Refill  . ALPHAGAN P 0.15 % ophthalmic solution Place 1 drop into the left eye 2 (two) times daily.     . Cholecalciferol (VITAMIN D3) 2000 UNITS TABS Take 1 tablet by mouth daily.     Marland Kitchen Cod Liver Oil 1000 MG CAPS Take 1 capsule by mouth daily.     . COMBIGAN 0.2-0.5 % ophthalmic solution Place 1 drop into both eyes 2 (two) times  daily.  12  . enalapril (VASOTEC) 5 MG tablet TAKE 1 TABLET TWICE A DAY 180 tablet 3  . glucose blood (ONE TOUCH ULTRA TEST) test strip USE 4 TIMES DAILY Dx code E11.9 200 each 5  . insulin glargine (LANTUS) 100 UNIT/ML injection Inject 0.4 mLs (40 Units total) into the skin daily. 10 mL 11  . Insulin Syringe-Needle U-100 (INSULIN SYRINGE .3CC/30GX5/16") 30G X 5/16" 0.3 ML MISC Use to inject 3 times per day. 100 each 0  . Misc Natural Products (URINOZINC PO) Take 1 tablet by mouth daily.     . Multiple Vitamins-Minerals (CENTRUM ADULTS PO) Take 1 tablet by mouth daily.     . Omega 3 1200 MG CAPS Take 1 capsule by mouth daily.     Glory Rosebush DELICA LANCETS 40N MISC Use to check blood sugar 4 times per day. Dx code: E11.9 200 each 2  . PRESCRIPTION MEDICATION Gets injections in eyes at eye dr's office     No current facility-administered medications on file prior to visit.     No Known Allergies  Family History  Problem Relation Age of Onset  . Diabetes Father   . Diabetes Paternal Grandfather     BP 118/68   Pulse Marland Kitchen)  59   Wt 166 lb 6.4 oz (75.5 kg)   SpO2 98%   BMI 28.56 kg/m    Review of Systems He denies hypoglycemia.      Objective:   Physical Exam VITAL SIGNS:  See vs page.  GENERAL: no distress.  Pulses: foot pulses are intact bilaterally.   MSK: no deformity of the feet or ankles.  CV: 1+ bilat edema of the legs, and bilat vv's Skin:  no ulcer on the feet or ankles.  normal color and temp on the feet and ankles Neuro: sensation is intact to touch on the feet and ankles.   Ext: There is bilateral onychomycosis of the toenails.     Assessment & Plan:  type 1 DM, with DR: he needs increased rx  Patient Instructions  check your blood sugar twice a day.  vary the time of day when you check, between before the 3 meals, and at bedtime.  also check if you have symptoms of your blood sugar being too high or too low.  please keep a record of the readings and bring it to  your next appointment here.  You can write it on any piece of paper.  please call us sooner if your blood sugar goes below 70, or if you have a lot of readings over 200.   Please continue the same lantus: 40 units daily, and:  increase humalog to 10 units 3 times a day (just before each meal).  Please call or message [place] next week, to tell us how the blood sugar is doing.   Please come back for a follow-up appointment in 1 month.

## 2017-10-08 NOTE — Patient Instructions (Addendum)
check your blood sugar twice a day.  vary the time of day when you check, between before the 3 meals, and at bedtime.  also check if you have symptoms of your blood sugar being too high or too low.  please keep a record of the readings and bring it to your next appointment here.  You can write it on any piece of paper.  please call us sooner if your blood sugar goes below 70, or if you have a lot of readings over 200.   Please continue the same lantus: 40 units daily, and:  increase humalog to 10 units 3 times a day (just before each meal).  Please call or message [place] next week, to tell us how the blood sugar is doing.   Please come back for a follow-up appointment in 1 month.

## 2017-10-16 ENCOUNTER — Encounter (INDEPENDENT_AMBULATORY_CARE_PROVIDER_SITE_OTHER): Payer: Medicare Other | Admitting: Ophthalmology

## 2017-10-16 DIAGNOSIS — H35033 Hypertensive retinopathy, bilateral: Secondary | ICD-10-CM | POA: Diagnosis not present

## 2017-10-16 DIAGNOSIS — I10 Essential (primary) hypertension: Secondary | ICD-10-CM

## 2017-10-16 DIAGNOSIS — E113513 Type 2 diabetes mellitus with proliferative diabetic retinopathy with macular edema, bilateral: Secondary | ICD-10-CM | POA: Diagnosis not present

## 2017-10-16 DIAGNOSIS — H43813 Vitreous degeneration, bilateral: Secondary | ICD-10-CM | POA: Diagnosis not present

## 2017-10-16 DIAGNOSIS — E11311 Type 2 diabetes mellitus with unspecified diabetic retinopathy with macular edema: Secondary | ICD-10-CM | POA: Diagnosis not present

## 2017-11-05 ENCOUNTER — Other Ambulatory Visit: Payer: Self-pay | Admitting: Endocrinology

## 2017-11-08 ENCOUNTER — Ambulatory Visit (INDEPENDENT_AMBULATORY_CARE_PROVIDER_SITE_OTHER): Payer: Medicare Other | Admitting: Endocrinology

## 2017-11-08 ENCOUNTER — Encounter: Payer: Self-pay | Admitting: Endocrinology

## 2017-11-08 VITALS — BP 130/70 | HR 69 | Wt 166.4 lb

## 2017-11-08 DIAGNOSIS — E10319 Type 1 diabetes mellitus with unspecified diabetic retinopathy without macular edema: Secondary | ICD-10-CM | POA: Diagnosis not present

## 2017-11-08 LAB — POCT GLYCOSYLATED HEMOGLOBIN (HGB A1C): HEMOGLOBIN A1C: 11

## 2017-11-08 MED ORDER — INSULIN LISPRO 100 UNIT/ML CARTRIDGE: 14.0000 [IU] | Freq: Three times a day (TID) | SUBCUTANEOUS | 11 refills | Status: DC

## 2017-11-08 NOTE — Progress Notes (Signed)
Subjective:    Patient ID: Andre Stout, male    DOB: July 07, 1940, 77 y.o.   MRN: [id number]  HPI Pt returns for f/u of diabetes mellitus: DM type: 1 Dx'ed: 7416 Complications: retinopathy.   Therapy: insulin since 2006 DKA: once, in 2017.  Severe hypoglycemia: never.  Pancreatitis: never.  Other: he takes multiple daily injections; he did not tolerate pioglitizone (edema)  Interval history: no cbg record, but states cbg's vary from 160-400.  It is in general higher as the day goes on  Pt says he never misses the insulin.  pt states he feels well in general.   Past Medical History:  Diagnosis Date  . Diabetes mellitus without complication ([place])   . Prostate cancer ([place])    Been 3-4 years ago    Past Surgical History:  Procedure Laterality Date  . CATARACT EXTRACTION Bilateral     Social History   Socioeconomic History  . Marital status: Widowed    Spouse name: Not on file  . Number of children: 1  . Years of education: 27  . Highest education level: Not on file  Social Needs  . Financial resource strain: Not on file  . Food insecurity - worry: Not on file  . Food insecurity - inability: Not on file  . Transportation needs - medical: Not on file  . Transportation needs - non-medical: Not on file  Occupational History  . Occupation: Retired  Tobacco Use  . Smoking status: Former Smoker    Packs/day: 0.50    Years: 4.00    Pack years: 2.00    Types: Cigarettes    Last attempt to quit: 10/29/1974    Years since quitting: 43.0  . Smokeless tobacco: Never Used  Substance and Sexual Activity  . Alcohol use: No  . Drug use: No  . Sexual activity: Not Currently  Other Topics Concern  . Not on file  Social History Narrative   Born and raised in [place], [place]. Currently reside in a private residence by himself. Daughter lives in the area. No live. Fun: hunt and fish   Denies religious beliefs that would effect health care.     Current Outpatient Medications on  File Prior to Visit  Medication Sig Dispense Refill  . ALPHAGAN P 0.15 % ophthalmic solution Place 1 drop into the left eye 2 (two) times daily.     . Cholecalciferol (VITAMIN D3) 2000 UNITS TABS Take 1 tablet by mouth daily.     Marland Kitchen Cod Liver Oil 1000 MG CAPS Take 1 capsule by mouth daily.     . COMBIGAN 0.2-0.5 % ophthalmic solution Place 1 drop into both eyes 2 (two) times daily.  12  . enalapril (VASOTEC) 5 MG tablet TAKE 1 TABLET TWICE A DAY 180 tablet 3  . glucose blood (ONE TOUCH ULTRA TEST) test strip USE 4 TIMES DAILY DX CODE E11.9 200 each 5  . insulin glargine (LANTUS) 100 UNIT/ML injection Inject 0.4 mLs (40 Units total) into the skin daily. 10 mL 11  . Insulin Syringe-Needle U-100 (INSULIN SYRINGE .3CC/30GX5/16") 30G X 5/16" 0.3 ML MISC Use to inject 3 times per day. 100 each 0  . Misc Natural Products (URINOZINC PO) Take 1 tablet by mouth daily.     . Multiple Vitamins-Minerals (CENTRUM ADULTS PO) Take 1 tablet by mouth daily.     . Omega 3 1200 MG CAPS Take 1 capsule by mouth daily.     Glory Rosebush DELICA LANCETS 38G MISC Use to  check blood sugar 4 times per day. Dx code: E11.9 200 each 2  . PRESCRIPTION MEDICATION Gets injections in eyes at eye dr's office     No current facility-administered medications on file prior to visit.     No Known Allergies  Family History  Problem Relation Age of Onset  . Diabetes Father   . Diabetes Paternal Grandfather     BP 130/70 (BP Location: Left Arm, Patient Position: Sitting, Cuff Size: Normal)   Pulse 69   Wt 166 lb 6.4 oz (75.5 kg)   SpO2 96%   BMI 28.56 kg/m    Review of Systems He denies hypoglycemia    Objective:   Physical Exam VITAL SIGNS:  See vs page.  GENERAL: no distress.  Pulses: foot pulses are intact bilaterally.   MSK: no deformity of the feet or ankles.  CV: 1+ bilat edema of the legs, and bilat vv's Skin:  no ulcer on the feet or ankles.  normal color and temp on the feet and ankles Neuro: sensation is  intact to touch on the feet and ankles.   Ext: There is bilateral onychomycosis of the toenails.   Lab Results  Component Value Date   HGBA1C 11.0 11/08/2017      Assessment & Plan:  Type 1 DM: poor control.  He may need a simpler regimen.    Patient Instructions  check your blood sugar twice a day.  vary the time of day when you check, between before the 3 meals, and at bedtime.  also check if you have symptoms of your blood sugar being too high or too low.  please keep a record of the readings and bring it to your next appointment here.  You can write it on any piece of paper.  please call us sooner if your blood sugar goes below 70, or if you have a lot of readings over 200.   Please continue the same lantus: 40 units daily, and:   increase humalog to 14 units 3 times a day (just before each meal).   Please come back for a follow-up appointment in 1 month.

## 2017-11-08 NOTE — Patient Instructions (Addendum)
check your blood sugar twice a day.  vary the time of day when you check, between before the 3 meals, and at bedtime.  also check if you have symptoms of your blood sugar being too high or too low.  please keep a record of the readings and bring it to your next appointment here.  You can write it on any piece of paper.  please call us sooner if your blood sugar goes below 70, or if you have a lot of readings over 200.   Please continue the same lantus: 40 units daily, and:   increase humalog to 14 units 3 times a day (just before each meal).   Please come back for a follow-up appointment in 1 month.

## 2017-11-12 ENCOUNTER — Telehealth: Payer: Self-pay

## 2017-11-12 NOTE — Telephone Encounter (Signed)
Tried to call patient to ask if he used Humalog cartridge or did he want to use vials. Pharmacy needed clarification because they stated patient was usually on vials & cartridges are more expensive.

## 2017-11-18 LAB — HM DIABETES EYE EXAM

## 2017-11-26 ENCOUNTER — Other Ambulatory Visit: Payer: Self-pay | Admitting: *Deleted

## 2017-11-27 ENCOUNTER — Encounter (INDEPENDENT_AMBULATORY_CARE_PROVIDER_SITE_OTHER): Payer: Medicare Other | Admitting: Ophthalmology

## 2017-11-27 DIAGNOSIS — E11311 Type 2 diabetes mellitus with unspecified diabetic retinopathy with macular edema: Secondary | ICD-10-CM | POA: Diagnosis not present

## 2017-11-27 DIAGNOSIS — E113513 Type 2 diabetes mellitus with proliferative diabetic retinopathy with macular edema, bilateral: Secondary | ICD-10-CM

## 2017-11-27 DIAGNOSIS — H43813 Vitreous degeneration, bilateral: Secondary | ICD-10-CM

## 2017-11-27 DIAGNOSIS — I10 Essential (primary) hypertension: Secondary | ICD-10-CM

## 2017-11-27 DIAGNOSIS — H35033 Hypertensive retinopathy, bilateral: Secondary | ICD-10-CM

## 2017-12-12 ENCOUNTER — Ambulatory Visit (INDEPENDENT_AMBULATORY_CARE_PROVIDER_SITE_OTHER): Payer: Medicare Other | Admitting: Endocrinology

## 2017-12-12 ENCOUNTER — Encounter: Payer: Self-pay | Admitting: Endocrinology

## 2017-12-12 VITALS — BP 110/58 | HR 60 | Wt 169.4 lb

## 2017-12-12 DIAGNOSIS — E10319 Type 1 diabetes mellitus with unspecified diabetic retinopathy without macular edema: Secondary | ICD-10-CM

## 2017-12-12 MED ORDER — INSULIN NPH (HUMAN) (ISOPHANE) 100 UNIT/ML ~~LOC~~ SUSP: 70.0000 [IU] | SUBCUTANEOUS | 11 refills | Status: DC

## 2017-12-12 NOTE — Progress Notes (Signed)
Subjective:    Patient ID: Andre Stout, male    DOB: May 12, 1940, 77 y.o.   MRN: [id number]  HPI Pt returns for f/u of diabetes mellitus:  DM type: 1 Dx'ed: 6073 Complications: retinopathy.   Therapy: insulin since 2006 DKA: once, in 2017.  Severe hypoglycemia: never.  Pancreatitis: never.  Other: he takes multiple daily injections; emphasizing lantus did not help; he did not tolerate pioglitizone (edema).   Interval history: no cbg record, but states cbg's vary from 75-400.  It is in general higher as the day goes on  Pt says he never misses the insulin.  pt states he feels well in general.  Past Medical History:  Diagnosis Date  . Diabetes mellitus without complication ([place])   . Prostate cancer ([place])    Been 3-4 years ago    Past Surgical History:  Procedure Laterality Date  . CATARACT EXTRACTION Bilateral     Social History   Socioeconomic History  . Marital status: Widowed    Spouse name: Not on file  . Number of children: 1  . Years of education: 49  . Highest education level: Not on file  Social Needs  . Financial resource strain: Not on file  . Food insecurity - worry: Not on file  . Food insecurity - inability: Not on file  . Transportation needs - medical: Not on file  . Transportation needs - non-medical: Not on file  Occupational History  . Occupation: Retired  Tobacco Use  . Smoking status: Former Smoker    Packs/day: 0.50    Years: 4.00    Pack years: 2.00    Types: Cigarettes    Last attempt to quit: 10/29/1974    Years since quitting: 43.1  . Smokeless tobacco: Never Used  Substance and Sexual Activity  . Alcohol use: No  . Drug use: No  . Sexual activity: Not Currently  Other Topics Concern  . Not on file  Social History Narrative   Born and raised in [place], [place]. Currently reside in a private residence by himself. Daughter lives in the area. No live. Fun: hunt and fish   Denies religious beliefs that would effect health care.      Current Outpatient Medications on File Prior to Visit  Medication Sig Dispense Refill  . ALPHAGAN P 0.15 % ophthalmic solution Place 1 drop into the left eye 2 (two) times daily.     . Cholecalciferol (VITAMIN D3) 2000 UNITS TABS Take 1 tablet by mouth daily.     Marland Kitchen Cod Liver Oil 1000 MG CAPS Take 1 capsule by mouth daily.     . COMBIGAN 0.2-0.5 % ophthalmic solution Place 1 drop into both eyes 2 (two) times daily.  12  . enalapril (VASOTEC) 5 MG tablet TAKE 1 TABLET TWICE A DAY 180 tablet 3  . glucose blood (ONE TOUCH ULTRA TEST) test strip USE 4 TIMES DAILY DX CODE E11.9 200 each 5  . Insulin Syringe-Needle U-100 (INSULIN SYRINGE .3CC/30GX5/16") 30G X 5/16" 0.3 ML MISC Use to inject 3 times per day. 100 each 0  . Misc Natural Products (URINOZINC PO) Take 1 tablet by mouth daily.     . Multiple Vitamins-Minerals (CENTRUM ADULTS PO) Take 1 tablet by mouth daily.     . Omega 3 1200 MG CAPS Take 1 capsule by mouth daily.     Glory Rosebush DELICA LANCETS 71G MISC Use to check blood sugar 4 times per day. Dx code: E11.9 200 each 2  .  PRESCRIPTION MEDICATION Gets injections in eyes at eye dr's office     No current facility-administered medications on file prior to visit.     No Known Allergies  Family History  Problem Relation Age of Onset  . Diabetes Father   . Diabetes Paternal Grandfather     BP (!) 110/58 (BP Location: Left Arm, Patient Position: Sitting, Cuff Size: Normal)   Pulse 60   Wt 169 lb 6.4 oz (76.8 kg)   SpO2 96%   BMI 29.08 kg/m    Review of Systems He denies hypoglycemia.     Objective:   Physical Exam VITAL SIGNS:  See vs page.  GENERAL: no distress.  Pulses: foot pulses are intact bilaterally.   MSK: no deformity of the feet or ankles.  CV: trace bilat edema of the legs, and bilat vv's Skin:  no ulcer on the feet or ankles.  normal color and temp on the feet and ankles Neuro: sensation is intact to touch on the feet and ankles.   Ext: There is bilateral  onychomycosis of the toenails.     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  We discussed.  He'll abandon multiple daily injections.    Patient Instructions  check your blood sugar twice a day.  vary the time of day when you check, between before the 3 meals, and at bedtime.  also check if you have symptoms of your blood sugar being too high or too low.  please keep a record of the readings and bring it to your next appointment here.  You can write it on any piece of paper.  please call us sooner if your blood sugar goes below 70, or if you have a lot of readings over 200.   Please change both current insulins to "NPH," 70 units each morning.  I have sent a prescription to your pharmacy.   On this type of insulin schedule, you should eat meals on a regular schedule (especially lunch).  If a meal is missed or significantly delayed, your blood sugar could go low.   Please come back for a follow-up appointment in 1 month.

## 2017-12-12 NOTE — Patient Instructions (Addendum)
check your blood sugar twice a day.  vary the time of day when you check, between before the 3 meals, and at bedtime.  also check if you have symptoms of your blood sugar being too high or too low.  please keep a record of the readings and bring it to your next appointment here.  You can write it on any piece of paper.  please call us sooner if your blood sugar goes below 70, or if you have a lot of readings over 200.   Please change both current insulins to "NPH," 70 units each morning.  I have sent a prescription to your pharmacy.   On this type of insulin schedule, you should eat meals on a regular schedule (especially lunch).  If a meal is missed or significantly delayed, your blood sugar could go low.   Please come back for a follow-up appointment in 1 month.

## 2018-01-06 ENCOUNTER — Encounter (INDEPENDENT_AMBULATORY_CARE_PROVIDER_SITE_OTHER): Payer: Medicare Other | Admitting: Ophthalmology

## 2018-01-06 ENCOUNTER — Other Ambulatory Visit: Payer: Self-pay | Admitting: Endocrinology

## 2018-01-06 DIAGNOSIS — E113512 Type 2 diabetes mellitus with proliferative diabetic retinopathy with macular edema, left eye: Secondary | ICD-10-CM

## 2018-01-06 DIAGNOSIS — E11311 Type 2 diabetes mellitus with unspecified diabetic retinopathy with macular edema: Secondary | ICD-10-CM | POA: Diagnosis not present

## 2018-01-06 DIAGNOSIS — I10 Essential (primary) hypertension: Secondary | ICD-10-CM | POA: Diagnosis not present

## 2018-01-06 DIAGNOSIS — H43813 Vitreous degeneration, bilateral: Secondary | ICD-10-CM

## 2018-01-06 DIAGNOSIS — H35033 Hypertensive retinopathy, bilateral: Secondary | ICD-10-CM

## 2018-01-06 DIAGNOSIS — E113591 Type 2 diabetes mellitus with proliferative diabetic retinopathy without macular edema, right eye: Secondary | ICD-10-CM | POA: Diagnosis not present

## 2018-01-13 ENCOUNTER — Ambulatory Visit: Payer: Medicare Other | Admitting: Endocrinology

## 2018-01-16 ENCOUNTER — Ambulatory Visit (INDEPENDENT_AMBULATORY_CARE_PROVIDER_SITE_OTHER): Payer: Medicare Other | Admitting: Endocrinology

## 2018-01-16 ENCOUNTER — Encounter: Payer: Self-pay | Admitting: Endocrinology

## 2018-01-16 VITALS — BP 138/70 | HR 68 | Resp 20 | Wt 165.2 lb

## 2018-01-16 DIAGNOSIS — E10311 Type 1 diabetes mellitus with unspecified diabetic retinopathy with macular edema: Secondary | ICD-10-CM

## 2018-01-16 LAB — POCT GLYCOSYLATED HEMOGLOBIN (HGB A1C): Hemoglobin A1C: 12.5

## 2018-01-16 MED ORDER — INSULIN NPH ISOPHANE & REGULAR (70-30) 100 UNIT/ML ~~LOC~~ SUSP: 70.0000 [IU] | Freq: Every day | SUBCUTANEOUS | 11 refills | Status: DC

## 2018-01-16 NOTE — Patient Instructions (Addendum)
check your blood sugar twice a day.  vary the time of day when you check, between before the 3 meals, and at bedtime.  also check if you have symptoms of your blood sugar being too high or too low.  please keep a record of the readings and bring it to your next appointment here.  You can write it on any piece of paper.  please call us sooner if your blood sugar goes below 70, or if you have a lot of readings over 200.   Please change the NPH insulin to "70/30," 70 units each morning with breakfast.  I [facility] sent a prescription to [facility]. On this type of insulin schedule, you should eat meals on a regular schedule (especially lunch).  If a meal is missed or significantly delayed, your blood sugar could go low.   Please come back for a follow-up appointment in 1 month.

## 2018-01-16 NOTE — Progress Notes (Signed)
Subjective:    Patient ID: Andre Stout, male    DOB: February 27, 1940, 78 y.o.   MRN: [id number]  HPI Pt returns for f/u of diabetes mellitus:  DM type: 1 Dx'ed: 1914 Complications: retinopathy.   Therapy: insulin since 2006.  DKA: once, in 2017.  Severe hypoglycemia: never.  Pancreatitis: never.  Other: emphasizing lantus did not help; he did not tolerate pioglitizone (edema); he takes qd insulin, after poor results with multiple daily injections.    Interval history: no cbg record, but states cbg's vary from 70-400.  It is in general highest at lunch, and lowest before the evening meal.  Pt says he never misses the insulin.  pt states he feels well in general.    Past Medical History:  Diagnosis Date  . Diabetes mellitus without complication ([place])   . Prostate cancer ([place])    Been 3-4 years ago    Past Surgical History:  Procedure Laterality Date  . CATARACT EXTRACTION Bilateral     Social History   Socioeconomic History  . Marital status: Widowed    Spouse name: Not on file  . Number of children: 1  . Years of education: 40  . Highest education level: Not on file  Social Needs  . Financial resource strain: Not on file  . Food insecurity - worry: Not on file  . Food insecurity - inability: Not on file  . Transportation needs - medical: Not on file  . Transportation needs - non-medical: Not on file  Occupational History  . Occupation: Retired  Tobacco Use  . Smoking status: Former Smoker    Packs/day: 0.50    Years: 4.00    Pack years: 2.00    Types: Cigarettes    Last attempt to quit: 10/29/1974    Years since quitting: 43.2  . Smokeless tobacco: Never Used  Substance and Sexual Activity  . Alcohol use: No  . Drug use: No  . Sexual activity: Not Currently  Other Topics Concern  . Not on file  Social History Narrative   Born and raised in [place], [place]. Currently reside in a private residence by himself. Daughter lives in the area. No live. Fun: hunt and  fish   Denies religious beliefs that would effect health care.     Current Outpatient Medications on File Prior to Visit  Medication Sig Dispense Refill  . ALPHAGAN P 0.15 % ophthalmic solution Place 1 drop into the left eye 2 (two) times daily.     . BD INSULIN SYRINGE U/F 31G X 5/16" 1 ML MISC USE 2 DAILY 180 each 3  . Cholecalciferol (VITAMIN D3) 2000 UNITS TABS Take 1 tablet by mouth daily.     Marland Kitchen Cod Liver Oil 1000 MG CAPS Take 1 capsule by mouth daily.     . COMBIGAN 0.2-0.5 % ophthalmic solution Place 1 drop into both eyes 2 (two) times daily.  12  . enalapril (VASOTEC) 5 MG tablet TAKE 1 TABLET TWICE A DAY 180 tablet 3  . glucose blood (ONE TOUCH ULTRA TEST) test strip USE 4 TIMES DAILY DX CODE E11.9 200 each 5  . Insulin Syringe-Needle U-100 (INSULIN SYRINGE .3CC/30GX5/16") 30G X 5/16" 0.3 ML MISC Use to inject 3 times per day. 100 each 0  . Misc Natural Products (URINOZINC PO) Take 1 tablet by mouth daily.     . Multiple Vitamins-Minerals (CENTRUM ADULTS PO) Take 1 tablet by mouth daily.     . Omega 3 1200 MG CAPS Take 1  capsule by mouth daily.     Glory Rosebush DELICA LANCETS 51O MISC Use to check blood sugar 4 times per day. Dx code: E11.9 200 each 2  . PRESCRIPTION MEDICATION Gets injections in eyes at eye dr's office     No current facility-administered medications on file prior to visit.     No Known Allergies  Family History  Problem Relation Age of Onset  . Diabetes Father   . Diabetes Paternal Grandfather     BP 138/70 (BP Location: Right Arm, Patient Position: Sitting, Cuff Size: Normal)   Pulse 68   Resp 20   Wt 165 lb 3.2 oz (74.9 kg)   SpO2 97%   BMI 28.36 kg/m    Review of Systems He denies hypoglycemia.      Objective:   Physical Exam VITAL SIGNS:  See vs page.  GENERAL: no distress.  Pulses: foot pulses are intact bilaterally.   MSK: no deformity of the feet or ankles.  CV: trace bilat edema of the legs, and bilat vv's.   Skin:  no ulcer on the  feet or ankles.  normal color and temp on the feet and ankles.  Neuro: sensation is intact to touch on the feet and ankles.   Ext: There is bilateral onychomycosis of the toenails.   Lab Results  Component Value Date   HGBA1C 12.5 01/16/2018      Assessment & Plan:  Type 1 DM, with DR: ongoing poor glycemic control.  Plan is for a simpler regimen.  The pattern of his cbg's indicates he needs a faster-acting qd insulin.   Patient Instructions  check your blood sugar twice a day.  vary the time of day when you check, between before the 3 meals, and at bedtime.  also check if you have symptoms of your blood sugar being too high or too low.  please keep a record of the readings and bring it to your next appointment here.  You can write it on any piece of paper.  please call us sooner if your blood sugar goes below 70, or if you have a lot of readings over 200.   Please change the NPH insulin to "70/30," 70 units each morning with breakfast.  [facility]. On this type of insulin schedule, you should eat meals on a regular schedule (especially lunch).  If a meal is missed or significantly delayed, your blood sugar could go low.   Please come back for a follow-up appointment in 1 month.

## 2018-01-17 ENCOUNTER — Telehealth: Payer: Self-pay | Admitting: Endocrinology

## 2018-01-17 NOTE — Telephone Encounter (Signed)
[facility] calling stated that insurance asking doctor to switch from Humulin 70/30 to Humulin. Please advise  [id number] [place], [place] - [address] [place] DEA #:  [phone]

## 2018-01-20 NOTE — Telephone Encounter (Signed)
I called patient back to clarify message. I asked he call back.

## 2018-02-17 ENCOUNTER — Ambulatory Visit (INDEPENDENT_AMBULATORY_CARE_PROVIDER_SITE_OTHER): Payer: Medicare Other | Admitting: Endocrinology

## 2018-02-17 ENCOUNTER — Encounter: Payer: Self-pay | Admitting: Endocrinology

## 2018-02-17 VITALS — BP 124/78 | HR 66 | Wt 169.6 lb

## 2018-02-17 DIAGNOSIS — E10319 Type 1 diabetes mellitus with unspecified diabetic retinopathy without macular edema: Secondary | ICD-10-CM | POA: Diagnosis not present

## 2018-02-17 NOTE — Patient Instructions (Addendum)
check your blood sugar twice a day.  vary the time of day when you check, between before the 3 meals, and at bedtime.  also check if you have symptoms of your blood sugar being too high or too low.  please keep a record of the readings and bring it to your next appointment here.  You can write it on any piece of paper.  please call us sooner if your blood sugar goes below 70, or if you have a lot of readings over 200.   blood tests are requested for you today.  We'll let you know about the results.   On this type of insulin schedule, you should eat meals on a regular schedule (especially lunch).  If a meal is missed or significantly delayed, your blood sugar could go low.   Please come back for a follow-up appointment in 3 months.

## 2018-02-17 NOTE — Progress Notes (Signed)
Subjective:    Patient ID: Andre Stout, male    DOB: Feb 25, 1940, 78 y.o.   MRN: [id number]  HPI Pt returns for f/u of diabetes mellitus:  DM type: 1 Dx'ed: 4098 Complications: retinopathy.   Therapy: insulin since 2006.  DKA: once, in 2017.  Severe hypoglycemia: never.  Pancreatitis: never.  Other: emphasizing lantus did not help; he did not tolerate pioglitizone (edema); he takes qd insulin, after poor results with multiple daily injections; he was changed to qam 70/30, due to pattern of cbg's.  Interval history: no cbg record, but states cbg's vary from 80-220.  It is lowest in the afternoon.  Pt says he never misses the insulin.  pt states he feels well in general.   Past Medical History:  Diagnosis Date  . Diabetes mellitus without complication ([place])   . Prostate cancer ([place])    Been 3-4 years ago    Past Surgical History:  Procedure Laterality Date  . CATARACT EXTRACTION Bilateral     Social History   Socioeconomic History  . Marital status: Widowed    Spouse name: Not on file  . Number of children: 1  . Years of education: 54  . Highest education level: Not on file  Social Needs  . Financial resource strain: Not on file  . Food insecurity - worry: Not on file  . Food insecurity - inability: Not on file  . Transportation needs - medical: Not on file  . Transportation needs - non-medical: Not on file  Occupational History  . Occupation: Retired  Tobacco Use  . Smoking status: Former Smoker    Packs/day: 0.50    Years: 4.00    Pack years: 2.00    Types: Cigarettes    Last attempt to quit: 10/29/1974    Years since quitting: 43.3  . Smokeless tobacco: Never Used  Substance and Sexual Activity  . Alcohol use: No  . Drug use: No  . Sexual activity: Not Currently  Other Topics Concern  . Not on file  Social History Narrative   Born and raised in [place], [place]. Currently reside in a private residence by himself. Daughter lives in the area. No live. Fun:  hunt and fish   Denies religious beliefs that would effect health care.     Current Outpatient Medications on File Prior to Visit  Medication Sig Dispense Refill  . ALPHAGAN P 0.15 % ophthalmic solution Place 1 drop into the left eye 2 (two) times daily.     . BD INSULIN SYRINGE U/F 31G X 5/16" 1 ML MISC USE 2 DAILY 180 each 3  . Cholecalciferol (VITAMIN D3) 2000 UNITS TABS Take 1 tablet by mouth daily.     Marland Kitchen Cod Liver Oil 1000 MG CAPS Take 1 capsule by mouth daily.     . COMBIGAN 0.2-0.5 % ophthalmic solution Place 1 drop into both eyes 2 (two) times daily.  12  . enalapril (VASOTEC) 5 MG tablet TAKE 1 TABLET TWICE A DAY 180 tablet 3  . glucose blood (ONE TOUCH ULTRA TEST) test strip USE 4 TIMES DAILY DX CODE E11.9 200 each 5  . insulin NPH-regular Human (NOVOLIN 70/30) (70-30) 100 UNIT/ML injection Inject 70 Units into the skin daily with breakfast. And syringes 1/day 20 mL 11  . Insulin Syringe-Needle U-100 (INSULIN SYRINGE .3CC/30GX5/16") 30G X 5/16" 0.3 ML MISC Use to inject 3 times per day. 100 each 0  . Misc Natural Products (URINOZINC PO) Take 1 tablet by mouth daily.     Marland Kitchen  Multiple Vitamins-Minerals (CENTRUM ADULTS PO) Take 1 tablet by mouth daily.     . Omega 3 1200 MG CAPS Take 1 capsule by mouth daily.     Glory Rosebush DELICA LANCETS 64Q MISC Use to check blood sugar 4 times per day. Dx code: E11.9 200 each 2  . PRESCRIPTION MEDICATION Gets injections in eyes at eye dr's office     No current facility-administered medications on file prior to visit.     No Known Allergies  Family History  Problem Relation Age of Onset  . Diabetes Father   . Diabetes Paternal Grandfather     BP 124/78 (BP Location: Left Arm, Patient Position: Sitting, Cuff Size: Normal)   Pulse 66   Wt 169 lb 9.6 oz (76.9 kg)   SpO2 96%   BMI 29.11 kg/m    Review of Systems He denies hypoglycemia.     Objective:   Physical Exam VITAL SIGNS:  See vs page.  GENERAL: no distress.  Pulses: foot  pulses are intact bilaterally.   MSK: no deformity of the feet or ankles.  CV: 1+ edema of the left leg, trace on the right, and bilat vv's.   Skin:  no ulcer on the feet or ankles.  normal color and temp on the feet and ankles.  Neuro: sensation is intact to touch on the feet and ankles.   Ext: There is bilateral onychomycosis of the toenails.        Assessment & Plan:  Type 1 DM, with DR.  Uncertain control.    Patient Instructions  check your blood sugar twice a day.  vary the time of day when you check, between before the 3 meals, and at bedtime.  also check if you have symptoms of your blood sugar being too high or too low.  please keep a record of the readings and bring it to your next appointment here.  You can write it on any piece of paper.  please call us sooner if your blood sugar goes below 70, or if you have a lot of readings over 200.   blood tests are requested for you today.  We'll let you know about the results.   On this type of insulin schedule, you should eat meals on a regular schedule (especially lunch).  If a meal is missed or significantly delayed, your blood sugar could go low.   Please come back for a follow-up appointment in 3 months.

## 2018-02-19 ENCOUNTER — Other Ambulatory Visit: Payer: Self-pay | Admitting: Endocrinology

## 2018-02-19 LAB — FRUCTOSAMINE: FRUCTOSAMINE: 497 umol/L — AB (ref 190–270)

## 2018-02-19 MED ORDER — INSULIN NPH ISOPHANE & REGULAR (70-30) 100 UNIT/ML ~~LOC~~ SUSP: 80.0000 [IU] | Freq: Every day | SUBCUTANEOUS | 11 refills | Status: DC

## 2018-02-27 NOTE — Telephone Encounter (Signed)
ok 

## 2018-02-27 NOTE — Telephone Encounter (Signed)
Error below  [facility] is requesting to change Novolin 70/30 to Humulin 70/30 due to insurance. Please advise

## 2018-03-03 ENCOUNTER — Encounter (INDEPENDENT_AMBULATORY_CARE_PROVIDER_SITE_OTHER): Payer: Medicare Other | Admitting: Ophthalmology

## 2018-03-03 DIAGNOSIS — E113513 Type 2 diabetes mellitus with proliferative diabetic retinopathy with macular edema, bilateral: Secondary | ICD-10-CM

## 2018-03-03 DIAGNOSIS — H43813 Vitreous degeneration, bilateral: Secondary | ICD-10-CM

## 2018-03-03 DIAGNOSIS — E11311 Type 2 diabetes mellitus with unspecified diabetic retinopathy with macular edema: Secondary | ICD-10-CM

## 2018-03-03 DIAGNOSIS — I10 Essential (primary) hypertension: Secondary | ICD-10-CM | POA: Diagnosis not present

## 2018-03-03 DIAGNOSIS — H35033 Hypertensive retinopathy, bilateral: Secondary | ICD-10-CM | POA: Diagnosis not present

## 2018-03-03 MED ORDER — INSULIN NPH ISOPHANE & REGULAR (70-30) 100 UNIT/ML ~~LOC~~ SUSP: 80.0000 [IU] | Freq: Every day | SUBCUTANEOUS | 11 refills | Status: DC

## 2018-03-03 NOTE — Telephone Encounter (Signed)
New Rx sent. See meds.  

## 2018-04-08 ENCOUNTER — Other Ambulatory Visit: Payer: Self-pay | Admitting: Endocrinology

## 2018-04-14 ENCOUNTER — Encounter (INDEPENDENT_AMBULATORY_CARE_PROVIDER_SITE_OTHER): Payer: Medicare Other | Admitting: Ophthalmology

## 2018-04-14 DIAGNOSIS — I10 Essential (primary) hypertension: Secondary | ICD-10-CM

## 2018-04-14 DIAGNOSIS — H43813 Vitreous degeneration, bilateral: Secondary | ICD-10-CM

## 2018-04-14 DIAGNOSIS — H35033 Hypertensive retinopathy, bilateral: Secondary | ICD-10-CM

## 2018-04-14 DIAGNOSIS — E113513 Type 2 diabetes mellitus with proliferative diabetic retinopathy with macular edema, bilateral: Secondary | ICD-10-CM

## 2018-04-14 DIAGNOSIS — E11311 Type 2 diabetes mellitus with unspecified diabetic retinopathy with macular edema: Secondary | ICD-10-CM

## 2018-05-16 ENCOUNTER — Ambulatory Visit (INDEPENDENT_AMBULATORY_CARE_PROVIDER_SITE_OTHER): Payer: Medicare Other | Admitting: Endocrinology

## 2018-05-16 ENCOUNTER — Encounter: Payer: Self-pay | Admitting: Endocrinology

## 2018-05-16 VITALS — BP 138/72 | HR 68 | Wt 184.6 lb

## 2018-05-16 DIAGNOSIS — E10319 Type 1 diabetes mellitus with unspecified diabetic retinopathy without macular edema: Secondary | ICD-10-CM | POA: Diagnosis not present

## 2018-05-16 LAB — POCT GLYCOSYLATED HEMOGLOBIN (HGB A1C): Hemoglobin A1C: 9.1

## 2018-05-16 MED ORDER — INSULIN NPH ISOPHANE & REGULAR (70-30) 100 UNIT/ML ~~LOC~~ SUSP: 80.0000 [IU] | Freq: Every day | SUBCUTANEOUS | 11 refills | Status: DC

## 2018-05-16 NOTE — Progress Notes (Signed)
Subjective:    Patient ID: Andre Stout, male    DOB: 1940-03-11, 78 y.o.   MRN: [id number]  HPI Pt returns for f/u of diabetes mellitus:  DM type: 1 Dx'ed: 5809 Complications: retinopathy.   Therapy: insulin since 2006.  DKA: once, in 2017.  Severe hypoglycemia: never.  Pancreatitis: never.  Other: emphasizing lantus did not help; he did not tolerate pioglitizone (edema); he takes qd insulin, after poor results with multiple daily injections; he was changed to qam 70/30, due to pattern of cbg's.  Interval history: no cbg record, but states cbg's vary from 45-300.  It is lowest in the afternoon.  He has mild hypoglycemia approx once per week.  Pt says he never misses the insulin.  pt states he feels well in general.  Past Medical History:  Diagnosis Date  . Diabetes mellitus without complication ([place])   . Prostate cancer ([place])    Been 3-4 years ago    Past Surgical History:  Procedure Laterality Date  . CATARACT EXTRACTION Bilateral     Social History   Socioeconomic History  . Marital status: Widowed    Spouse name: Not on file  . Number of children: 1  . Years of education: 73  . Highest education level: Not on file  Occupational History  . Occupation: Retired  Scientific laboratory technician  . Financial resource strain: Not on file  . Food insecurity:    Worry: Not on file    Inability: Not on file  . Transportation needs:    Medical: Not on file    Non-medical: Not on file  Tobacco Use  . Smoking status: Former Smoker    Packs/day: 0.50    Years: 4.00    Pack years: 2.00    Types: Cigarettes    Last attempt to quit: 10/29/1974    Years since quitting: 43.5  . Smokeless tobacco: Never Used  Substance and Sexual Activity  . Alcohol use: No  . Drug use: No  . Sexual activity: Not Currently  Lifestyle  . Physical activity:    Days per week: Not on file    Minutes per session: Not on file  . Stress: Not on file  Relationships  . Social connections:    Talks on  phone: Not on file    Gets together: Not on file    Attends religious service: Not on file    Active member of club or organization: Not on file    Attends meetings of clubs or organizations: Not on file    Relationship status: Not on file  . Intimate partner violence:    Fear of current or ex partner: Not on file    Emotionally abused: Not on file    Physically abused: Not on file    Forced sexual activity: Not on file  Other Topics Concern  . Not on file  Social History Narrative   Born and raised in [place], [place]. Currently reside in a private residence by himself. Daughter lives in the area. No live. Fun: hunt and fish   Denies religious beliefs that would effect health care.     Current Outpatient Medications on File Prior to Visit  Medication Sig Dispense Refill  . ALPHAGAN P 0.15 % ophthalmic solution Place 1 drop into the left eye 2 (two) times daily.     . BD INSULIN SYRINGE U/F 31G X 5/16" 1 ML MISC USE 2 DAILY 180 each 3  . Cholecalciferol (VITAMIN D3) 2000 UNITS TABS Take  1 tablet by mouth daily.     Marland Kitchen Cod Liver Oil 1000 MG CAPS Take 1 capsule by mouth daily.     . COMBIGAN 0.2-0.5 % ophthalmic solution Place 1 drop into both eyes 2 (two) times daily.  12  . enalapril (VASOTEC) 5 MG tablet TAKE 1 TABLET TWICE A DAY 180 tablet 3  . glucose blood (ONE TOUCH ULTRA TEST) test strip USE 4 TIMES DAILY DX CODE E11.9 200 each 1  . Insulin Syringe-Needle U-100 (INSULIN SYRINGE .3CC/30GX5/16") 30G X 5/16" 0.3 ML MISC Use to inject 3 times per day. 100 each 0  . Misc Natural Products (URINOZINC PO) Take 1 tablet by mouth daily.     . Multiple Vitamins-Minerals (CENTRUM ADULTS PO) Take 1 tablet by mouth daily.     . Omega 3 1200 MG CAPS Take 1 capsule by mouth daily.     Glory Rosebush DELICA LANCETS 76P MISC Use to check blood sugar 4 times per day. Dx code: E11.9 200 each 2  . PRESCRIPTION MEDICATION Gets injections in eyes at eye dr's office     No current facility-administered  medications on file prior to visit.     No Known Allergies  Family History  Problem Relation Age of Onset  . Diabetes Father   . Diabetes Paternal Grandfather     BP 138/72   Pulse 68   Wt 184 lb 9.6 oz (83.7 kg)   SpO2 97%   BMI 31.69 kg/m    Review of Systems Denies LOC.      Objective:   Physical Exam VITAL SIGNS:  See vs page.  GENERAL: no distress.  Pulses: foot pulses are intact bilaterally.   MSK: no deformity of the feet or ankles.  CV: 1+ edema of the left leg, trace on the right, and bilat vv's.   Skin:  no ulcer on the feet or ankles.  normal color and temp on the feet and ankles.  Neuro: sensation is intact to touch on the feet and ankles.   Ext: There is bilateral onychomycosis of the toenails.    A1c=9.1%  Lab Results  Component Value Date   CREATININE 0.95 03/21/2016   BUN 14 03/21/2016   NA 135 03/21/2016   K 4.8 03/21/2016   CL 101 03/21/2016   CO2 27 03/21/2016       Assessment & Plan:  type 1 DM, with DR: he needs increased rx Hypoglycemia: we can't increase insulin until this is addressed.   Patient Instructions  check your blood sugar twice a day.  vary the time of day when you check, between before the 3 meals, and at bedtime.  also check if you have symptoms of your blood sugar being too high or too low.  please keep a record of the readings and bring it to your next appointment here.  You can write it on any piece of paper.  please call us sooner if your blood sugar goes below 70, or if you have a lot of readings over 200.  Please continue the same insulin for now.  I [facility] sent a prescription [facility] On this type of insulin schedule, you should eat meals on a regular schedule (especially lunch).  If a meal is missed or significantly delayed, your blood sugar could go low.  We need to get rid of the lows before we can increase the insulin.  Please come back for a follow-up appointment in 2 months.

## 2018-05-16 NOTE — Patient Instructions (Addendum)
check your blood sugar twice a day.  vary the time of day when you check, between before the 3 meals, and at bedtime.  also check if you have symptoms of your blood sugar being too high or too low.  please keep a record of the readings and bring it to your next appointment here.  You can write it on any piece of paper.  please call us sooner if your blood sugar goes below 70, or if you have a lot of readings over 200.  Please continue the same insulin for now.  I have sent a prescription to [facility] On this type of insulin schedule, you should eat meals on a regular schedule (especially lunch).  If a meal is missed or significantly delayed, your blood sugar could go low.  We need to get rid of the lows before we can increase the insulin.  Please come back for a follow-up appointment in 2 months.

## 2018-05-19 ENCOUNTER — Encounter (INDEPENDENT_AMBULATORY_CARE_PROVIDER_SITE_OTHER): Payer: Medicare Other | Admitting: Ophthalmology

## 2018-05-19 DIAGNOSIS — H35033 Hypertensive retinopathy, bilateral: Secondary | ICD-10-CM

## 2018-05-19 DIAGNOSIS — E113513 Type 2 diabetes mellitus with proliferative diabetic retinopathy with macular edema, bilateral: Secondary | ICD-10-CM

## 2018-05-19 DIAGNOSIS — I10 Essential (primary) hypertension: Secondary | ICD-10-CM | POA: Diagnosis not present

## 2018-05-19 DIAGNOSIS — E11311 Type 2 diabetes mellitus with unspecified diabetic retinopathy with macular edema: Secondary | ICD-10-CM

## 2018-05-19 DIAGNOSIS — H43813 Vitreous degeneration, bilateral: Secondary | ICD-10-CM

## 2018-06-30 ENCOUNTER — Encounter (INDEPENDENT_AMBULATORY_CARE_PROVIDER_SITE_OTHER): Payer: Medicare Other | Admitting: Ophthalmology

## 2018-06-30 DIAGNOSIS — H43813 Vitreous degeneration, bilateral: Secondary | ICD-10-CM

## 2018-06-30 DIAGNOSIS — I10 Essential (primary) hypertension: Secondary | ICD-10-CM

## 2018-06-30 DIAGNOSIS — E113513 Type 2 diabetes mellitus with proliferative diabetic retinopathy with macular edema, bilateral: Secondary | ICD-10-CM

## 2018-06-30 DIAGNOSIS — H35033 Hypertensive retinopathy, bilateral: Secondary | ICD-10-CM

## 2018-06-30 DIAGNOSIS — E11311 Type 2 diabetes mellitus with unspecified diabetic retinopathy with macular edema: Secondary | ICD-10-CM | POA: Diagnosis not present

## 2018-07-23 ENCOUNTER — Encounter: Payer: Self-pay | Admitting: Endocrinology

## 2018-07-23 ENCOUNTER — Ambulatory Visit (INDEPENDENT_AMBULATORY_CARE_PROVIDER_SITE_OTHER): Payer: Medicare Other | Admitting: Endocrinology

## 2018-07-23 VITALS — BP 124/68 | HR 74 | Ht 64.0 in | Wt 180.8 lb

## 2018-07-23 DIAGNOSIS — E10319 Type 1 diabetes mellitus with unspecified diabetic retinopathy without macular edema: Secondary | ICD-10-CM

## 2018-07-23 LAB — POCT GLYCOSYLATED HEMOGLOBIN (HGB A1C): Hemoglobin A1C: 9.2 % — AB (ref 4.0–5.6)

## 2018-07-23 NOTE — Progress Notes (Signed)
Subjective:    Patient ID: Andre Stout, male    DOB: 03-05-1940, 78 y.o.   MRN: [id number]  HPI Pt returns for f/u of diabetes mellitus:  DM type: 1 Dx'ed: 3354 Complications: retinopathy.   Therapy: insulin since 2006.  DKA: once, in 2017.  Severe hypoglycemia: never.  Pancreatitis: never.  Other: emphasizing lantus did not help; he did not tolerate pioglitizone (edema); he takes qd insulin, after poor results with multiple daily injections; he was changed to qam 70/30, due to pattern of cbg's.  Interval history: no cbg record, but states cbg's vary from 50-300's.  It is still lowest in the afternoon.  He has mild hypoglycemia approx 3 times per week.  Pt says he never misses the insulin.  pt states he otherwise feels well in general.  Past Medical History:  Diagnosis Date  . Diabetes mellitus without complication ([facility])   . Prostate cancer ([place])    Been 3-4 years ago    Past Surgical History:  Procedure Laterality Date  . CATARACT EXTRACTION Bilateral     Social History   Socioeconomic History  . Marital status: Widowed    Spouse name: Not on file  . Number of children: 1  . Years of education: 83  . Highest education level: Not on file  Occupational History  . Occupation: Retired  Scientific laboratory technician  . Financial resource strain: Not on file  . Food insecurity:    Worry: Not on file    Inability: Not on file  . Transportation needs:    Medical: Not on file    Non-medical: Not on file  Tobacco Use  . Smoking status: Former Smoker    Packs/day: 0.50    Years: 4.00    Pack years: 2.00    Types: Cigarettes    Last attempt to quit: 10/29/1974    Years since quitting: 43.7  . Smokeless tobacco: Never Used  Substance and Sexual Activity  . Alcohol use: No  . Drug use: No  . Sexual activity: Not Currently  Lifestyle  . Physical activity:    Days per week: Not on file    Minutes per session: Not on file  . Stress: Not on file  Relationships  . Social  connections:    Talks on phone: Not on file    Gets together: Not on file    Attends religious service: Not on file    Active member of club or organization: Not on file    Attends meetings of clubs or organizations: Not on file    Relationship status: Not on file  . Intimate partner violence:    Fear of current or ex partner: Not on file    Emotionally abused: Not on file    Physically abused: Not on file    Forced sexual activity: Not on file  Other Topics Concern  . Not on file  Social History Narrative   Born and raised in [place], [place]. Currently reside in a private residence by himself. Daughter lives in the area. No live. Fun: hunt and fish   Denies religious beliefs that would effect health care.     Current Outpatient Medications on File Prior to Visit  Medication Sig Dispense Refill  . ALPHAGAN P 0.15 % ophthalmic solution Place 1 drop into the left eye 2 (two) times daily.     . BD INSULIN SYRINGE U/F 31G X 5/16" 1 ML MISC USE 2 DAILY 180 each 3  . Cholecalciferol (VITAMIN D3) 2000  UNITS TABS Take 1 tablet by mouth daily.     Marland Kitchen Cod Liver Oil 1000 MG CAPS Take 1 capsule by mouth daily.     . COMBIGAN 0.2-0.5 % ophthalmic solution Place 1 drop into both eyes 2 (two) times daily.  12  . enalapril (VASOTEC) 5 MG tablet TAKE 1 TABLET TWICE A DAY 180 tablet 3  . glucose blood (ONE TOUCH ULTRA TEST) test strip USE 4 TIMES DAILY DX CODE E11.9 200 each 1  . insulin NPH-regular Human (HUMULIN 70/30) (70-30) 100 UNIT/ML injection Inject 80 Units into the skin daily with breakfast. 30 mL 11  . Insulin Syringe-Needle U-100 (INSULIN SYRINGE .3CC/30GX5/16") 30G X 5/16" 0.3 ML MISC Use to inject 3 times per day. 100 each 0  . Misc Natural Products (URINOZINC PO) Take 1 tablet by mouth daily.     . Multiple Vitamins-Minerals (CENTRUM ADULTS PO) Take 1 tablet by mouth daily.     . Omega 3 1200 MG CAPS Take 1 capsule by mouth daily.     Glory Rosebush DELICA LANCETS 31V MISC Use to check blood  sugar 4 times per day. Dx code: E11.9 200 each 2  . PRESCRIPTION MEDICATION Gets injections in eyes at eye dr's office     No current facility-administered medications on file prior to visit.     No Known Allergies  Family History  Problem Relation Age of Onset  . Diabetes Father   . Diabetes Paternal Grandfather     BP 124/68 (BP Location: Left Arm, Patient Position: Sitting, Cuff Size: Normal)   Pulse 74   Ht 5\' 4"  (1.626 m)   Wt 180 lb 12.8 oz (82 kg)   SpO2 97%   BMI 31.03 kg/m    Review of Systems Denies LOC    Objective:   Physical Exam VITAL SIGNS:  See vs page.  GENERAL: no distress.  Pulses: foot pulses are intact bilaterally.   MSK: no deformity of the feet or ankles.  CV: trace bilat edema of the legs, and bilat vv's.   Skin:  no ulcer on the feet or ankles.  normal color and temp on the feet and ankles.  Neuro: sensation is intact to touch on the feet and ankles.   Ext: There is bilateral onychomycosis of the toenails.   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  Hypoglycemia: this is limiting aggressiveness of glycemic control.   Patient Instructions  check your blood sugar twice a day.  vary the time of day when you check, between before the 3 meals, and at bedtime.  also check if you have symptoms of your blood sugar being too high or too low.  please keep a record of the readings and bring it to your next appointment here.  You can write it on any piece of paper.  please call us sooner if your blood sugar goes below 70, or if you have a lot of readings over 200.  Please continue the same insulin for now.  On this type of insulin schedule, you should eat meals on a regular schedule (especially lunch).  If a meal is missed or significantly delayed, your blood sugar  could go low.  We need to get rid of  the lows before we can increase the insulin.  You can do this be eating an afternoon snack.   Please come back for a follow-up appointment in 2 months.

## 2018-07-23 NOTE — Patient Instructions (Addendum)
check your blood sugar twice a day.  vary the time of day when you check, between before the 3 meals, and at bedtime.  also check if you have symptoms of your blood sugar being too high or too low.  please keep a record of the readings and bring it to your next appointment here.  You can write it on any piece of paper.  please call us sooner if your blood sugar goes below 70, or if you have a lot of readings over 200.  Please continue the same insulin for now.  On this type of insulin schedule, you should eat meals on a regular schedule (especially lunch).  If a meal is missed or significantly delayed, your blood sugar could go low.  We need to get rid of the lows before we can increase the insulin.  You can do this be eating an afternoon snack.   Please come back for a follow-up appointment in 2 months.

## 2018-08-11 ENCOUNTER — Encounter (INDEPENDENT_AMBULATORY_CARE_PROVIDER_SITE_OTHER): Payer: Medicare Other | Admitting: Ophthalmology

## 2018-08-11 DIAGNOSIS — I10 Essential (primary) hypertension: Secondary | ICD-10-CM

## 2018-08-11 DIAGNOSIS — E113592 Type 2 diabetes mellitus with proliferative diabetic retinopathy without macular edema, left eye: Secondary | ICD-10-CM | POA: Diagnosis not present

## 2018-08-11 DIAGNOSIS — E11311 Type 2 diabetes mellitus with unspecified diabetic retinopathy with macular edema: Secondary | ICD-10-CM | POA: Diagnosis not present

## 2018-08-11 DIAGNOSIS — E113511 Type 2 diabetes mellitus with proliferative diabetic retinopathy with macular edema, right eye: Secondary | ICD-10-CM | POA: Diagnosis not present

## 2018-08-11 DIAGNOSIS — H35033 Hypertensive retinopathy, bilateral: Secondary | ICD-10-CM

## 2018-08-11 DIAGNOSIS — H43813 Vitreous degeneration, bilateral: Secondary | ICD-10-CM

## 2018-09-18 LAB — HM DIABETES EYE EXAM

## 2018-09-22 ENCOUNTER — Encounter (INDEPENDENT_AMBULATORY_CARE_PROVIDER_SITE_OTHER): Payer: Medicare Other | Admitting: Ophthalmology

## 2018-09-22 DIAGNOSIS — E113513 Type 2 diabetes mellitus with proliferative diabetic retinopathy with macular edema, bilateral: Secondary | ICD-10-CM

## 2018-09-22 DIAGNOSIS — E11311 Type 2 diabetes mellitus with unspecified diabetic retinopathy with macular edema: Secondary | ICD-10-CM | POA: Diagnosis not present

## 2018-09-22 DIAGNOSIS — I10 Essential (primary) hypertension: Secondary | ICD-10-CM | POA: Diagnosis not present

## 2018-09-22 DIAGNOSIS — H35033 Hypertensive retinopathy, bilateral: Secondary | ICD-10-CM

## 2018-09-22 DIAGNOSIS — H43813 Vitreous degeneration, bilateral: Secondary | ICD-10-CM

## 2018-09-23 ENCOUNTER — Other Ambulatory Visit: Payer: Self-pay | Admitting: Endocrinology

## 2018-09-25 ENCOUNTER — Encounter: Payer: Self-pay | Admitting: Endocrinology

## 2018-09-25 ENCOUNTER — Ambulatory Visit (INDEPENDENT_AMBULATORY_CARE_PROVIDER_SITE_OTHER): Payer: Medicare Other | Admitting: Endocrinology

## 2018-09-25 VITALS — BP 124/72 | HR 78 | Ht 64.0 in | Wt 181.0 lb

## 2018-09-25 DIAGNOSIS — E10319 Type 1 diabetes mellitus with unspecified diabetic retinopathy without macular edema: Secondary | ICD-10-CM | POA: Diagnosis not present

## 2018-09-25 LAB — POCT GLYCOSYLATED HEMOGLOBIN (HGB A1C): HEMOGLOBIN A1C: 9.5 % — AB (ref 4.0–5.6)

## 2018-09-25 MED ORDER — INSULIN NPH ISOPHANE & REGULAR (70-30) 100 UNIT/ML ~~LOC~~ SUSP: 80.0000 [IU] | Freq: Every day | SUBCUTANEOUS | 11 refills | Status: DC

## 2018-09-25 NOTE — Patient Instructions (Addendum)
check your blood sugar twice a day.  vary the time of day when you check, between before the 3 meals, and at bedtime.  also check if you have symptoms of your blood sugar being too high or too low.  please keep a record of the readings and bring it to your next appointment here.  You can write it on any piece of paper.  please call us sooner if your blood sugar goes below 70, or if you have a lot of readings over 200.  Please change the insulin to take it with lunch.  On this type of insulin schedule, you should eat meals on a regular schedule (especially lunch).  If a meal is missed or significantly delayed, your blood sugar could go low.  We need to get rid of the lows before we can increase the insulin.  You can do this be eating an afternoon snack.   Please come back for a follow-up appointment in 2 months.

## 2018-09-25 NOTE — Progress Notes (Signed)
Subjective:    Patient ID: Andre Stout, male    DOB: [birth year], 78 y.o.   MRN: [id number]  HPI Pt returns for f/u of diabetes mellitus:  DM type: 1 Dx'ed: 4174 Complications: retinopathy.   Therapy: insulin since 2006.  DKA: once, in 2017.  Severe hypoglycemia: never.  Pancreatitis: never.  Other: emphasizing lantus did not help; he did not tolerate pioglitizone (edema); he takes qd insulin, after poor results with multiple daily injections; he was changed to qam 70/30, due to pattern of cbg's. He is not a pump candidate, due to no recording cbg's.   Interval history: no cbg record, but states cbg's vary from 68-300's.  It is still lowest before the evening meal.  He has mild hypoglycemia almost daily.  Pt says he never misses the insulin.  pt states he otherwise feels well in general.  Past Medical History:  Diagnosis Date  . Diabetes mellitus without complication ([facility])   . Prostate cancer ([facility])    Been 3-4 years ago    Past Surgical History:  Procedure Laterality Date  . CATARACT EXTRACTION Bilateral     Social History   Socioeconomic History  . Marital status: Widowed    Spouse name: Not on file  . Number of children: 1  . Years of education: 28  . Highest education level: Not on file  Occupational History  . Occupation: Retired  Scientific laboratory technician  . Financial resource strain: Not on file  . Food insecurity:    Worry: Not on file    Inability: Not on file  . Transportation needs:    Medical: Not on file    Non-medical: Not on file  Tobacco Use  . Smoking status: Former Smoker    Packs/day: 0.50    Years: 4.00    Pack years: 2.00    Types: Cigarettes    Last attempt to quit: 10/29/1974    Years since quitting: 43.9  . Smokeless tobacco: Never Used  Substance and Sexual Activity  . Alcohol use: No  . Drug use: No  . Sexual activity: Not Currently  Lifestyle  . Physical activity:    Days per week: Not on file    Minutes per session: Not on file  .  Stress: Not on file  Relationships  . Social connections:    Talks on phone: Not on file    Gets together: Not on file    Attends religious service: Not on file    Active member of club or organization: Not on file    Attends meetings of clubs or organizations: Not on file    Relationship status: Not on file  . Intimate partner violence:    Fear of current or ex partner: Not on file    Emotionally abused: Not on file    Physically abused: Not on file    Forced sexual activity: Not on file  Other Topics Concern  . Not on file  Social History Narrative   Born and raised in [place], [place]. Currently reside in a private residence by himself. Daughter lives in the area. No live. Fun: hunt and fish   Denies religious beliefs that would effect health care.     Current Outpatient Medications on File Prior to Visit  Medication Sig Dispense Refill  . ALPHAGAN P 0.15 % ophthalmic solution Place 1 drop into the left eye 2 (two) times daily.     . BD INSULIN SYRINGE U/F 31G X 5/16" 1 ML MISC USE 2  DAILY 180 each 3  . Cholecalciferol (VITAMIN D3) 2000 UNITS TABS Take 1 tablet by mouth daily.     Marland Kitchen Cod Liver Oil 1000 MG CAPS Take 1 capsule by mouth daily.     . COMBIGAN 0.2-0.5 % ophthalmic solution Place 1 drop into both eyes 2 (two) times daily.  12  . enalapril (VASOTEC) 5 MG tablet TAKE 1 TABLET TWICE A DAY 180 tablet 3  . glucose blood (ONE TOUCH ULTRA TEST) test strip USE AS DIRECTED TO TEST BLOOD SUGAR 4 TIMES A DAY DX: E11.9 200 each 1  . Insulin Syringe-Needle U-100 (INSULIN SYRINGE .3CC/30GX5/16") 30G X 5/16" 0.3 ML MISC Use to inject 3 times per day. 100 each 0  . Misc Natural Products (URINOZINC PO) Take 1 tablet by mouth daily.     . Multiple Vitamins-Minerals (CENTRUM ADULTS PO) Take 1 tablet by mouth daily.     . Omega 3 1200 MG CAPS Take 1 capsule by mouth daily.     Glory Rosebush DELICA LANCETS 37C MISC Use to check blood sugar 4 times per day. Dx code: E11.9 200 each 2  .  PRESCRIPTION MEDICATION Gets injections in eyes at eye dr's office     No current facility-administered medications on file prior to visit.     No Known Allergies  Family History  Problem Relation Age of Onset  . Diabetes Father   . Diabetes Paternal Grandfather     BP 124/72 (BP Location: Left Arm)   Pulse 78   Ht 5\' 4"  (1.626 m)   Wt 181 lb (82.1 kg)   SpO2 96%   BMI 31.07 kg/m    Review of Systems Denies LOC.    Objective:   Physical Exam VITAL SIGNS:  See vs page.  GENERAL: no distress.  Pulses: foot pulses are intact bilaterally.   MSK: no deformity of the feet or ankles.  CV: 1+ bilat edema of the legs, and bilat vv's.   Skin:  no ulcer on the feet or ankles.  normal color and temp on the feet and ankles.   Neuro: sensation is intact to touch on the feet and ankles.   Ext: There is bilateral onychomycosis of the toenails.    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   Patient Instructions  check your blood sugar twice a day.  vary the time of day when you check, between before the 3 meals, and at bedtime.  also check if you have symptoms of your blood sugar being too high or too low.  please keep a record of the readings and bring it to your next appointment here.  You can write it on any piece of paper.  please call us sooner if your blood sugar goes below 70, or if you have a lot of readings over 200.  Please change the insulin to take it with lunch.  On this type of insulin schedule, you should eat meals on a regular schedule (especially lunch).  If a meal is missed or significantly delayed, your blood sugar could go low.  We need to get rid of the lows before we can increase the insulin.  You can do this be eating an afternoon snack.   Please come back for a follow-up appointment in 2 months.

## 2018-11-03 ENCOUNTER — Encounter (INDEPENDENT_AMBULATORY_CARE_PROVIDER_SITE_OTHER): Payer: Medicare Other | Admitting: Ophthalmology

## 2018-11-03 DIAGNOSIS — H35033 Hypertensive retinopathy, bilateral: Secondary | ICD-10-CM | POA: Diagnosis not present

## 2018-11-03 DIAGNOSIS — E11311 Type 2 diabetes mellitus with unspecified diabetic retinopathy with macular edema: Secondary | ICD-10-CM

## 2018-11-03 DIAGNOSIS — E113513 Type 2 diabetes mellitus with proliferative diabetic retinopathy with macular edema, bilateral: Secondary | ICD-10-CM

## 2018-11-03 DIAGNOSIS — I10 Essential (primary) hypertension: Secondary | ICD-10-CM | POA: Diagnosis not present

## 2018-11-03 DIAGNOSIS — H43813 Vitreous degeneration, bilateral: Secondary | ICD-10-CM

## 2018-11-20 LAB — HM DIABETES EYE EXAM

## 2018-12-01 ENCOUNTER — Ambulatory Visit (INDEPENDENT_AMBULATORY_CARE_PROVIDER_SITE_OTHER): Payer: Medicare Other | Admitting: Endocrinology

## 2018-12-01 ENCOUNTER — Encounter: Payer: Self-pay | Admitting: Endocrinology

## 2018-12-01 VITALS — BP 120/78 | HR 85 | Ht 64.0 in | Wt 187.2 lb

## 2018-12-01 DIAGNOSIS — E10319 Type 1 diabetes mellitus with unspecified diabetic retinopathy without macular edema: Secondary | ICD-10-CM

## 2018-12-01 LAB — POCT GLYCOSYLATED HEMOGLOBIN (HGB A1C): Hemoglobin A1C: 8.3 % — AB (ref 4.0–5.6)

## 2018-12-01 MED ORDER — INSULIN NPH ISOPHANE & REGULAR (70-30) 100 UNIT/ML ~~LOC~~ SUSP: SUBCUTANEOUS | 11 refills | Status: DC

## 2018-12-01 NOTE — Patient Instructions (Addendum)
check your blood sugar twice a day.  vary the time of day when you check, between before the 3 meals, and at bedtime.  also check if you have symptoms of your blood sugar being too high or too low.  please keep a record of the readings and bring it to your next appointment here.  You can write it on any piece of paper.  please call us sooner if your blood sugar goes below 70, or if you have a lot of readings over 200.  Please change the insulin to 20 units with breakfast, and 60 with lunch.  On this type of insulin schedule, you should eat meals on a regular schedule (especially lunch).  If a meal is missed or significantly delayed, your blood sugar could go low.  We need to get rid of the lows before we can increase the insulin.  You can do this be eating an afternoon snack.   Please come back for a follow-up appointment in 2 months.

## 2018-12-01 NOTE — Progress Notes (Signed)
Subjective:    Patient ID: Andre Stout, male    DOB: 1940/06/11, 78 y.o.   MRN: [id number]  HPI Pt returns for f/u of diabetes mellitus:  DM type: 1 Dx'ed: 0174 Complications: retinopathy.   Therapy: insulin since 2006.  DKA: once, in 2017.  Severe hypoglycemia: never.  Pancreatitis: never.  Other: emphasizing lantus did not help; he did not tolerate pioglitizone (edema); he takes qd insulin, after poor results with multiple daily injections; he was changed to qam 70/30, due to pattern of cbg's. He is not a pump candidate, due to not recording cbg's.   Interval history: no cbg record, but states cbg's vary from 60-300.  It is usually lowest before the evening meal.  He has mild hypoglycemia almost daily.  Pt says he never misses the insulin.  pt states he otherwise feels well in general.  Past Medical History:  Diagnosis Date  . Diabetes mellitus without complication ([facility])   . Prostate cancer ([facility])    Been 3-4 years ago    Past Surgical History:  Procedure Laterality Date  . CATARACT EXTRACTION Bilateral     Social History   Socioeconomic History  . Marital status: Widowed    Spouse name: Not on file  . Number of children: 1  . Years of education: 60  . Highest education level: Not on file  Occupational History  . Occupation: Retired  Scientific laboratory technician  . Financial resource strain: Not on file  . Food insecurity:    Worry: Not on file    Inability: Not on file  . Transportation needs:    Medical: Not on file    Non-medical: Not on file  Tobacco Use  . Smoking status: Former Smoker    Packs/day: 0.50    Years: 4.00    Pack years: 2.00    Types: Cigarettes    Last attempt to quit: 10/29/1974    Years since quitting: 44.1  . Smokeless tobacco: Never Used  Substance and Sexual Activity  . Alcohol use: No  . Drug use: No  . Sexual activity: Not Currently  Lifestyle  . Physical activity:    Days per week: Not on file    Minutes per session: Not on file  .  Stress: Not on file  Relationships  . Social connections:    Talks on phone: Not on file    Gets together: Not on file    Attends religious service: Not on file    Active member of club or organization: Not on file    Attends meetings of clubs or organizations: Not on file    Relationship status: Not on file  . Intimate partner violence:    Fear of current or ex partner: Not on file    Emotionally abused: Not on file    Physically abused: Not on file    Forced sexual activity: Not on file  Other Topics Concern  . Not on file  Social History Narrative   Born and raised in [place], [place]. Currently reside in a private residence by himself. Daughter lives in the area. No live. Fun: hunt and fish   Denies religious beliefs that would effect health care.     Current Outpatient Medications on File Prior to Visit  Medication Sig Dispense Refill  . ALPHAGAN P 0.15 % ophthalmic solution Place 1 drop into the left eye 2 (two) times daily.     . BD INSULIN SYRINGE U/F 31G X 5/16" 1 ML MISC USE 2  DAILY 180 each 3  . Cholecalciferol (VITAMIN D3) 2000 UNITS TABS Take 1 tablet by mouth daily.     Marland Kitchen Cod Liver Oil 1000 MG CAPS Take 1 capsule by mouth daily.     . COMBIGAN 0.2-0.5 % ophthalmic solution Place 1 drop into both eyes 2 (two) times daily.  12  . enalapril (VASOTEC) 5 MG tablet TAKE 1 TABLET TWICE A DAY 180 tablet 3  . glucose blood (ONE TOUCH ULTRA TEST) test strip USE AS DIRECTED TO TEST BLOOD SUGAR 4 TIMES A DAY DX: E11.9 200 each 1  . Insulin Syringe-Needle U-100 (INSULIN SYRINGE .3CC/30GX5/16") 30G X 5/16" 0.3 ML MISC Use to inject 3 times per day. 100 each 0  . Misc Natural Products (URINOZINC PO) Take 1 tablet by mouth daily.     . Multiple Vitamins-Minerals (CENTRUM ADULTS PO) Take 1 tablet by mouth daily.     . Omega 3 1200 MG CAPS Take 1 capsule by mouth daily.     Glory Rosebush DELICA LANCETS 56D MISC Use to check blood sugar 4 times per day. Dx code: E11.9 200 each 2  .  PRESCRIPTION MEDICATION Gets injections in eyes at eye dr's office     No current facility-administered medications on file prior to visit.     No Known Allergies  Family History  Problem Relation Age of Onset  . Diabetes Father   . Diabetes Paternal Grandfather     BP 120/78 (BP Location: Left Arm, Patient Position: Sitting, Cuff Size: Normal)   Pulse 85   Ht 5\' 4"  (1.626 m)   Wt 187 lb 3.2 oz (84.9 kg)   SpO2 91%   BMI 32.13 kg/m    Review of Systems He denies LOC.      Objective:   Physical Exam VITAL SIGNS:  See vs page.  GENERAL: no distress.  Pulses: foot pulses are intact bilaterally.   MSK: no deformity of the feet or ankles.  CV: trace bilat edema of the legs, and bilat vv's.   Skin:  no ulcer on the feet or ankles.  normal color and temp on the feet and ankles.   Neuro: sensation is intact to touch on the feet and ankles.   Ext: There is bilateral onychomycosis of the toenails    Lab Results  Component Value Date   HGBA1C 8.3 (A) 12/01/2018   Lab Results  Component Value Date   CREATININE 0.95 03/21/2016   BUN 14 03/21/2016   NA 135 03/21/2016   K 4.8 03/21/2016   CL 101 03/21/2016   CO2 27 03/21/2016      Assessment & Plan:  Type 1 DM, with DR: he needs increased rx Hypoglycemia: we need to address this first.  This insulin mixture on this schedule, is unusual, but it appears to be the best option.  Noncompliance with cbg recording: he is not a candidate for a complex med regimen  Patient Instructions  check your blood sugar twice a day.  vary the time of day when you check, between before the 3 meals, and at bedtime.  also check if you have symptoms of your blood sugar being too high or too low.  please keep a record of the readings and bring it to your next appointment here.  You can write it on any piece of paper.  please call us sooner if your blood sugar goes below 70, or if you have a lot of readings over 200.  Please change the insulin to  20  units with breakfast, and 60 with lunch.  On this type of insulin schedule, you should eat meals on a regular schedule (especially lunch).  If a meal is missed or significantly delayed, your blood sugar could go low.  We need to get rid of the lows before we can increase the insulin.  You can do this be eating an afternoon snack.   Please come back for a follow-up appointment in 2 months.

## 2018-12-08 ENCOUNTER — Encounter (INDEPENDENT_AMBULATORY_CARE_PROVIDER_SITE_OTHER): Payer: Medicare Other | Admitting: Ophthalmology

## 2018-12-08 DIAGNOSIS — E113513 Type 2 diabetes mellitus with proliferative diabetic retinopathy with macular edema, bilateral: Secondary | ICD-10-CM | POA: Diagnosis not present

## 2018-12-08 DIAGNOSIS — H43813 Vitreous degeneration, bilateral: Secondary | ICD-10-CM

## 2018-12-08 DIAGNOSIS — H35033 Hypertensive retinopathy, bilateral: Secondary | ICD-10-CM | POA: Diagnosis not present

## 2018-12-08 DIAGNOSIS — E11311 Type 2 diabetes mellitus with unspecified diabetic retinopathy with macular edema: Secondary | ICD-10-CM

## 2018-12-08 DIAGNOSIS — I10 Essential (primary) hypertension: Secondary | ICD-10-CM | POA: Diagnosis not present

## 2018-12-29 ENCOUNTER — Other Ambulatory Visit: Payer: Self-pay | Admitting: Endocrinology

## 2019-01-03 ENCOUNTER — Other Ambulatory Visit: Payer: Self-pay | Admitting: Endocrinology

## 2019-01-06 ENCOUNTER — Encounter (INDEPENDENT_AMBULATORY_CARE_PROVIDER_SITE_OTHER): Payer: Medicare Other | Admitting: Ophthalmology

## 2019-01-06 ENCOUNTER — Encounter: Payer: Self-pay | Admitting: Endocrinology

## 2019-01-06 DIAGNOSIS — H35033 Hypertensive retinopathy, bilateral: Secondary | ICD-10-CM

## 2019-01-06 DIAGNOSIS — I10 Essential (primary) hypertension: Secondary | ICD-10-CM | POA: Diagnosis not present

## 2019-01-06 DIAGNOSIS — E113513 Type 2 diabetes mellitus with proliferative diabetic retinopathy with macular edema, bilateral: Secondary | ICD-10-CM | POA: Diagnosis not present

## 2019-01-06 DIAGNOSIS — E11311 Type 2 diabetes mellitus with unspecified diabetic retinopathy with macular edema: Secondary | ICD-10-CM

## 2019-01-06 DIAGNOSIS — H43813 Vitreous degeneration, bilateral: Secondary | ICD-10-CM

## 2019-01-06 LAB — HM DIABETES EYE EXAM

## 2019-02-03 ENCOUNTER — Ambulatory Visit: Payer: Medicare Other | Admitting: Endocrinology

## 2019-02-03 ENCOUNTER — Encounter (INDEPENDENT_AMBULATORY_CARE_PROVIDER_SITE_OTHER): Payer: Medicare Other | Admitting: Ophthalmology

## 2019-02-03 DIAGNOSIS — H43813 Vitreous degeneration, bilateral: Secondary | ICD-10-CM

## 2019-02-03 DIAGNOSIS — H35033 Hypertensive retinopathy, bilateral: Secondary | ICD-10-CM | POA: Diagnosis not present

## 2019-02-03 DIAGNOSIS — I10 Essential (primary) hypertension: Secondary | ICD-10-CM

## 2019-02-03 DIAGNOSIS — E113513 Type 2 diabetes mellitus with proliferative diabetic retinopathy with macular edema, bilateral: Secondary | ICD-10-CM

## 2019-02-03 DIAGNOSIS — E11311 Type 2 diabetes mellitus with unspecified diabetic retinopathy with macular edema: Secondary | ICD-10-CM

## 2019-02-11 ENCOUNTER — Ambulatory Visit (INDEPENDENT_AMBULATORY_CARE_PROVIDER_SITE_OTHER): Payer: Medicare Other | Admitting: Endocrinology

## 2019-02-11 ENCOUNTER — Encounter: Payer: Self-pay | Admitting: Endocrinology

## 2019-02-11 VITALS — BP 140/70 | HR 86 | Ht 64.0 in | Wt 185.0 lb

## 2019-02-11 DIAGNOSIS — E10319 Type 1 diabetes mellitus with unspecified diabetic retinopathy without macular edema: Secondary | ICD-10-CM

## 2019-02-11 LAB — POCT GLYCOSYLATED HEMOGLOBIN (HGB A1C): Hemoglobin A1C: 9.3 % — AB (ref 4.0–5.6)

## 2019-02-11 NOTE — Progress Notes (Signed)
Subjective:    Patient ID: Andre Stout, male    DOB: May 14, 1940, 79 y.o.   MRN: [id number]  HPI Pt returns for f/u of diabetes mellitus:  DM type: 1 Dx'ed: 3818 Complications: retinopathy.   Therapy: insulin since 2006.  DKA: once, in 2017.  Severe hypoglycemia: never.  Pancreatitis: never.  Other: emphasizing basal insulin did not help; he did not tolerate pioglitizone (edema); he takes BID insulin, after poor results with multiple daily injections; he was changed to 70/30, due to pattern of cbg's. He is not a pump candidate, due to not recording cbg's.   Interval history: no cbg record, but states cbg's vary from 60-300.  It is usually lowest before the evening meal.  He has mild hypoglycemia almost daily.  Pt says he was taking the insulin inconsistently when he was out of town (was out x 2 mos--just returned a few days ago).  pt states he otherwise feels well in general.  Past Medical History:  Diagnosis Date  . Diabetes mellitus without complication ([place])   . Prostate cancer ([place])    Been 3-4 years ago    Past Surgical History:  Procedure Laterality Date  . CATARACT EXTRACTION Bilateral     Social History   Socioeconomic History  . Marital status: Widowed    Spouse name: Not on file  . Number of children: 1  . Years of education: 37  . Highest education level: Not on file  Occupational History  . Occupation: Retired  Scientific laboratory technician  . Financial resource strain: Not on file  . Food insecurity:    Worry: Not on file    Inability: Not on file  . Transportation needs:    Medical: Not on file    Non-medical: Not on file  Tobacco Use  . Smoking status: Former Smoker    Packs/day: 0.50    Years: 4.00    Pack years: 2.00    Types: Cigarettes    Last attempt to quit: 10/29/1974    Years since quitting: 44.3  . Smokeless tobacco: Never Used  Substance and Sexual Activity  . Alcohol use: No  . Drug use: No  . Sexual activity: Not Currently  Lifestyle  .  Physical activity:    Days per week: Not on file    Minutes per session: Not on file  . Stress: Not on file  Relationships  . Social connections:    Talks on phone: Not on file    Gets together: Not on file    Attends religious service: Not on file    Active member of club or organization: Not on file    Attends meetings of clubs or organizations: Not on file    Relationship status: Not on file  . Intimate partner violence:    Fear of current or ex partner: Not on file    Emotionally abused: Not on file    Physically abused: Not on file    Forced sexual activity: Not on file  Other Topics Concern  . Not on file  Social History Narrative   Born and raised in [place], [place]. Currently reside in a private residence by himself. Daughter lives in the area. No live. Fun: hunt and fish   Denies religious beliefs that would effect health care.     Current Outpatient Medications on File Prior to Visit  Medication Sig Dispense Refill  . ALPHAGAN P 0.15 % ophthalmic solution Place 1 drop into the left eye 2 (two) times daily.     Marland Kitchen  BD INSULIN SYRINGE U/F 31G X 5/16" 1 ML MISC USE 2 DAILY 180 each 4  . Cholecalciferol (VITAMIN D3) 2000 UNITS TABS Take 1 tablet by mouth daily.     Marland Kitchen Cod Liver Oil 1000 MG CAPS Take 1 capsule by mouth daily.     . COMBIGAN 0.2-0.5 % ophthalmic solution Place 1 drop into both eyes 2 (two) times daily.  12  . enalapril (VASOTEC) 5 MG tablet TAKE 1 TABLET TWICE A DAY 180 tablet 3  . glucose blood test strip Use to monitor glucose levels 4 times per day; E11.9 200 each 1  . insulin NPH-regular Human (HUMULIN 70/30) (70-30) 100 UNIT/ML injection 20 units with breakfast, and 60 with lunch 30 mL 11  . Insulin Syringe-Needle U-100 (INSULIN SYRINGE .3CC/30GX5/16") 30G X 5/16" 0.3 ML MISC Use to inject 3 times per day. 100 each 0  . Misc Natural Products (URINOZINC PO) Take 1 tablet by mouth daily.     . Multiple Vitamins-Minerals (CENTRUM ADULTS PO) Take 1 tablet by mouth  daily.     . Omega 3 1200 MG CAPS Take 1 capsule by mouth daily.     Glory Rosebush DELICA LANCETS 85O MISC Use to check blood sugar 4 times per day. Dx code: E11.9 200 each 2  . PRESCRIPTION MEDICATION Gets injections in eyes at eye dr's office     No current facility-administered medications on file prior to visit.     No Known Allergies  Family History  Problem Relation Age of Onset  . Diabetes Father   . Diabetes Paternal Grandfather     BP 140/70 (BP Location: Left Arm, Patient Position: Sitting, Cuff Size: Normal)   Pulse 86   Ht 5\' 4"  (1.626 m)   Wt 185 lb (83.9 kg)   SpO2 98%   BMI 31.76 kg/m    Review of Systems He has gained weight    Objective:   Physical Exam VITAL SIGNS:  See vs page.  GENERAL: no distress.  Pulses: foot pulses are intact bilaterally.   MSK: no deformity of the feet or ankles.  CV: trace bilat edema of the legs, and bilat vv's.   Skin:  no ulcer on the feet or ankles.  normal color and temp on the feet and ankles.   Neuro: sensation is intact to touch on the feet and ankles.   Ext: There is bilateral onychomycosis of the toenails.     Lab Results  Component Value Date   HGBA1C 9.3 (A) 02/11/2019      Assessment & Plan:  Type 1 DM, with DR: worse Noncompliance with insulin.  We discussed.   Hypoglycemia: This limits aggressiveness of glycemic control   Patient Instructions  check your blood sugar twice a day.  vary the time of day when you check, between before the 3 meals, and at bedtime.  also check if you have symptoms of your blood sugar being too high or too low.  please keep a record of the readings and bring it to your next appointment here.  You can write it on any piece of paper.  please call us sooner if your blood sugar goes below 70, or if you have a lot of readings over 200.  Please continue the same insulin: 20 units with breakfast, and 60 with lunch.  On this type of insulin schedule, you should eat meals on a regular  schedule (especially lunch).  If a meal is missed or significantly delayed, your blood sugar could  go low.  We need to get rid of the lows before we can increase the insulin.  You can do this be eating an afternoon snack.   Please come back for a follow-up appointment in 2 months.

## 2019-02-11 NOTE — Patient Instructions (Addendum)
check your blood sugar twice a day.  vary the time of day when you check, between before the 3 meals, and at bedtime.  also check if you have symptoms of your blood sugar being too high or too low.  please keep a record of the readings and bring it to your next appointment here.  You can write it on any piece of paper.  please call us sooner if your blood sugar goes below 70, or if you have a lot of readings over 200.  Please continue the same insulin: 20 units with breakfast, and 60 with lunch.  On this type of insulin schedule, you should eat meals on a regular schedule (especially lunch).  If a meal is missed or significantly delayed, your blood sugar could go low.  We need to get rid of the lows before we can increase the insulin.  You can do this be eating an afternoon snack.   Please come back for a follow-up appointment in 2 months.

## 2019-03-03 ENCOUNTER — Encounter (INDEPENDENT_AMBULATORY_CARE_PROVIDER_SITE_OTHER): Payer: Medicare Other | Admitting: Ophthalmology

## 2019-03-16 ENCOUNTER — Encounter (INDEPENDENT_AMBULATORY_CARE_PROVIDER_SITE_OTHER): Payer: Medicare Other | Admitting: Ophthalmology

## 2019-03-16 ENCOUNTER — Other Ambulatory Visit: Payer: Self-pay

## 2019-03-16 DIAGNOSIS — E11311 Type 2 diabetes mellitus with unspecified diabetic retinopathy with macular edema: Secondary | ICD-10-CM | POA: Diagnosis not present

## 2019-03-16 DIAGNOSIS — E113513 Type 2 diabetes mellitus with proliferative diabetic retinopathy with macular edema, bilateral: Secondary | ICD-10-CM | POA: Diagnosis not present

## 2019-03-16 DIAGNOSIS — I10 Essential (primary) hypertension: Secondary | ICD-10-CM | POA: Diagnosis not present

## 2019-03-16 DIAGNOSIS — H43813 Vitreous degeneration, bilateral: Secondary | ICD-10-CM

## 2019-03-16 DIAGNOSIS — H35033 Hypertensive retinopathy, bilateral: Secondary | ICD-10-CM | POA: Diagnosis not present

## 2019-03-24 ENCOUNTER — Ambulatory Visit: Payer: Self-pay | Admitting: *Deleted

## 2019-03-24 NOTE — Telephone Encounter (Signed)
Pt reports 2 sores on left leg, one back of leg, one on side of leg. States both size of quarter. Noted 1 month ago, states stated as "Little bumps." No injury. States woound beds are pink, no drainage. Reports area surrounding wounds are reddened, "About inch wide around wound." No warmth, denies pain. Reports leg is swollen from knee to foot, "Slightly" non pitting. States afebrile. Reports BS has been fluctuating. Pt states he has been cleansing wounds daily with hydrogen peroxide and applying neosporin, leaving open to air. Care advise given for proper treatment of wounds. Appt made with Mindi Slicker for tomorrow. TN did attempt to speak with practice to verify appt appropriate, need for TOC appt; unable to reach. Pt unable to do Med Ex appt or Telephonic. Pt former pt of Dr. Elna Breslow. Pt's CB number if needed: [phone]  Reason for Disposition . [1] Unexplained sores AND [2] 3 or more    Pt is diabetic  Answer Assessment - Initial Assessment Questions 1. APPEARANCE of SORES: "What do the sores look like?"      2. NUMBER: "How many sores are there?"     2 left leg 3. SIZE: "How big is the largest sore?"     Both "Size of quarter" 4. LOCATION: "Where are the sores located?"     Left leg, one behind leg, one on side 5. ONSET: "When did the sores begin?"     1 month ago 6. CAUSE: "What do you think is causing the sores?"     Pt is doabetic, no injury 7. OTHER SYMPTOMS: "Do you have any other symptoms?" (e.g., fever, new weakness)     no  Protocols used: SORES-A-AH

## 2019-03-25 ENCOUNTER — Encounter: Payer: Self-pay | Admitting: Family

## 2019-03-25 ENCOUNTER — Encounter: Payer: Medicare Other | Admitting: Family

## 2019-03-25 ENCOUNTER — Other Ambulatory Visit: Payer: Self-pay

## 2019-03-25 ENCOUNTER — Ambulatory Visit (INDEPENDENT_AMBULATORY_CARE_PROVIDER_SITE_OTHER): Payer: Medicare Other | Admitting: Family

## 2019-03-25 ENCOUNTER — Other Ambulatory Visit (INDEPENDENT_AMBULATORY_CARE_PROVIDER_SITE_OTHER): Payer: Medicare Other

## 2019-03-25 VITALS — BP 144/88 | HR 82 | Temp 97.9°F | Ht 64.0 in | Wt 195.0 lb

## 2019-03-25 DIAGNOSIS — L03116 Cellulitis of left lower limb: Secondary | ICD-10-CM | POA: Diagnosis not present

## 2019-03-25 DIAGNOSIS — E10319 Type 1 diabetes mellitus with unspecified diabetic retinopathy without macular edema: Secondary | ICD-10-CM | POA: Diagnosis not present

## 2019-03-25 DIAGNOSIS — C61 Malignant neoplasm of prostate: Secondary | ICD-10-CM | POA: Insufficient documentation

## 2019-03-25 LAB — COMPREHENSIVE METABOLIC PANEL
ALT: 16 U/L (ref 0–53)
AST: 17 U/L (ref 0–37)
Albumin: 3.9 g/dL (ref 3.5–5.2)
Alkaline Phosphatase: 95 U/L (ref 39–117)
BUN: 10 mg/dL (ref 6–23)
CO2: 28 mEq/L (ref 19–32)
CREATININE: 1.03 mg/dL (ref 0.40–1.50)
Calcium: 9.5 mg/dL (ref 8.4–10.5)
Chloride: 100 mEq/L (ref 96–112)
GFR: 69.78 mL/min (ref >60.00)
Glucose, Bld: 379 mg/dL — ABNORMAL HIGH (ref 70–99)
Potassium: 4.2 mEq/L (ref 3.5–5.1)
Sodium: 137 mEq/L (ref 135–145)
Total Bilirubin: 0.9 mg/dL (ref 0.2–1.2)
Total Protein: 7.5 g/dL (ref 6.0–8.3)

## 2019-03-25 LAB — LIPID PANEL
Cholesterol: 222 mg/dL — ABNORMAL HIGH (ref 0–200)
HDL: 60.8 mg/dL (ref >39.00)
NonHDL: 160.71
Total CHOL/HDL Ratio: 4
Triglycerides: 296 mg/dL — ABNORMAL HIGH (ref 0.0–149.0)
VLDL: 59.2 mg/dL — ABNORMAL HIGH (ref 0.0–40.0)

## 2019-03-25 LAB — LDL CHOLESTEROL, DIRECT: Direct LDL: 121 mg/dL

## 2019-03-25 LAB — PSA: PSA: 0.03 ng/mL — ABNORMAL LOW (ref 0.10–4.00)

## 2019-03-25 MED ORDER — DOXYCYCLINE HYCLATE 100 MG PO TABS: 100.0000 mg | ORAL_TABLET | Freq: Two times a day (BID) | ORAL | 0 refills | Status: DC

## 2019-03-25 MED ORDER — MUPIROCIN 2 % EX OINT: TOPICAL_OINTMENT | CUTANEOUS | 0 refills | Status: DC

## 2019-03-25 MED ORDER — ENALAPRIL MALEATE 2.5 MG PO TABS: 2.5000 mg | ORAL_TABLET | Freq: Every day | ORAL | 1 refills | Status: DC

## 2019-03-25 NOTE — Progress Notes (Signed)
Andre Stout is a 79 y.o. male with the following history as recorded in EpicCare:  Patient Active Problem List   Diagnosis Date Noted  . Prostate cancer ([place]) 03/25/2019  . Diabetes ([place]) 03/28/2016  . Hypophosphatemia 03/06/2016  . Hypomagnesemia 03/06/2016  . Influenza A 03/05/2016  . Benign essential HTN 03/05/2016  . AKI (acute kidney injury) ([place]) 03/04/2016  . Routine general medical examination at a health care facility 05/02/2015  . Medicare annual wellness visit, subsequent 05/02/2015    Current Outpatient Medications  Medication Sig Dispense Refill  . ALPHAGAN P 0.15 % ophthalmic solution Place 1 drop into the left eye 2 (two) times daily.     . BD INSULIN SYRINGE U/F 31G X 5/16" 1 ML MISC USE 2 DAILY 180 each 4  . Cholecalciferol (VITAMIN D3) 2000 UNITS TABS Take 1 tablet by mouth daily.     Marland Kitchen Cod Liver Oil 1000 MG CAPS Take 1 capsule by mouth daily.     . COMBIGAN 0.2-0.5 % ophthalmic solution Place 1 drop into both eyes 2 (two) times daily.  12  . glucose blood test strip Use to monitor glucose levels 4 times per day; E11.9 200 each 1  . insulin NPH-regular Human (HUMULIN 70/30) (70-30) 100 UNIT/ML injection 20 units with breakfast, and 60 with lunch 30 mL 11  . Insulin Syringe-Needle U-100 (INSULIN SYRINGE .3CC/30GX5/16") 30G X 5/16" 0.3 ML MISC Use to inject 3 times per day. 100 each 0  . Misc Natural Products (URINOZINC PO) Take 1 tablet by mouth daily.     . Multiple Vitamins-Minerals (CENTRUM ADULTS PO) Take 1 tablet by mouth daily.     . Omega 3 1200 MG CAPS Take 1 capsule by mouth daily.     Glory Rosebush DELICA LANCETS 18A MISC Use to check blood sugar 4 times per day. Dx code: E11.9 200 each 2  . PRESCRIPTION MEDICATION Gets injections in eyes at eye dr's office    . doxycycline (VIBRA-TABS) 100 MG tablet Take 1 tablet (100 mg total) by mouth 2 (two) times daily. 28 tablet 0  . enalapril (VASOTEC) 2.5 MG tablet Take 1 tablet (2.5 mg total) by mouth daily. 90  tablet 1  . mupirocin ointment (BACTROBAN) 2 % Apply bid to affected area 30 g 0   No current facility-administered medications for this visit.     Allergies: Patient has no known allergies.  Past Medical History:  Diagnosis Date  . Diabetes mellitus without complication ([place])   . Prostate cancer ([place])    Been 3-4 years ago    Past Surgical History:  Procedure Laterality Date  . CATARACT EXTRACTION Bilateral     Family History  Problem Relation Age of Onset  . Diabetes Father   . Diabetes Paternal Grandfather     Social History   Tobacco Use  . Smoking status: Former Smoker    Packs/day: 0.50    Years: 4.00    Pack years: 2.00    Types: Cigarettes    Last attempt to quit: 10/29/1974    Years since quitting: 44.4  . Smokeless tobacco: Never Used  Substance Use Topics  . Alcohol use: No    Subjective:  Has not been seen in almost 3 years; healthcare needs have been managed by his endocrinologist- history of Type 1 Diabetes; concerned about non-healing wounds on left lower leg x 1 month; has been self-treating with hydrogen peroxide and Neo-sporin; no fever; areas are scabbed but has noticed some increased swelling  in his left lower leg.     Objective:  Vitals:   03/25/19 0918  BP: (!) 144/88  Pulse: 82  Temp: 97.9 F (36.6 C)  TempSrc: Oral  SpO2: 96%  Weight: 195 lb (88.5 kg)  Height: _0  (1.626 m)    General: Well developed, well nourished, in no acute distress  Skin : Warm and dry. 2 scabbed wounds on left lower leg Head: Normocephalic and atraumatic  Lungs: Respirations unlabored; clear to auscultation bilaterally without wheeze, rales, rhonchi  Extremities: mild non-pitting edema, cyanosis, clubbing  Vessels: Symmetric bilaterally  Neurologic: Alert and oriented; speech intact; face symmetrical; moves all extremities well; CNII-XII intact without focal deficit   Assessment:  1. Cellulitis of left lower extremity   2. Prostate cancer ([place])   3. Type  1 diabetes mellitus with retinopathy, macular edema presence unspecified, unspecified laterality, unspecified retinopathy severity ([place])     Plan:  1. Rx for Doxycycline 100 mg bid x 10-14 days; use Bactroban bid; re-check in 1 week; patient cannot do virtual visit- will meet him in our parking lot for his safety. 2. Check PSA; no records for review here; may need urology consult. 3. Uncontrolled; discussed statin and he defers; check lipid panel today; continue with endocrinologist.   Return for 1 week televisit with me; 2-3 monthsAWV with Sharee Pimple.  Orders Placed This Encounter  Procedures  . PSA    Standing Status:   Future    Number of Occurrences:   1    Standing Expiration Date:   03/24/2020  . Lipid panel    Standing Status:   Future    Number of Occurrences:   1    Standing Expiration Date:   03/24/2020  . Comp Met (CMET)    Standing Status:   Future    Number of Occurrences:   1    Standing Expiration Date:   03/24/2020    Requested Prescriptions   Signed Prescriptions Disp Refills  . enalapril (VASOTEC) 2.5 MG tablet 90 tablet 1    Sig: Take 1 tablet (2.5 mg total) by mouth daily.  Marland Kitchen doxycycline (VIBRA-TABS) 100 MG tablet 28 tablet 0    Sig: Take 1 tablet (100 mg total) by mouth 2 (two) times daily.  . mupirocin ointment (BACTROBAN) 2 % 30 g 0    Sig: Apply bid to affected area

## 2019-03-31 ENCOUNTER — Other Ambulatory Visit: Payer: Self-pay | Admitting: Endocrinology

## 2019-04-01 ENCOUNTER — Ambulatory Visit (INDEPENDENT_AMBULATORY_CARE_PROVIDER_SITE_OTHER): Payer: Medicare Other | Admitting: Family

## 2019-04-01 ENCOUNTER — Encounter: Payer: Self-pay | Admitting: Family

## 2019-04-01 ENCOUNTER — Ambulatory Visit: Payer: Medicare Other | Admitting: Family

## 2019-04-01 VITALS — BP 140/86 | HR 78 | Temp 98.4°F | Ht 64.0 in

## 2019-04-01 DIAGNOSIS — E10311 Type 1 diabetes mellitus with unspecified diabetic retinopathy with macular edema: Secondary | ICD-10-CM | POA: Diagnosis not present

## 2019-04-01 DIAGNOSIS — C61 Malignant neoplasm of prostate: Secondary | ICD-10-CM | POA: Diagnosis not present

## 2019-04-01 DIAGNOSIS — L03116 Cellulitis of left lower limb: Secondary | ICD-10-CM | POA: Diagnosis not present

## 2019-04-01 MED ORDER — MUPIROCIN 2 % EX OINT: TOPICAL_OINTMENT | CUTANEOUS | 1 refills | Status: DC

## 2019-04-01 MED ORDER — DOXYCYCLINE HYCLATE 100 MG PO TABS: 100.0000 mg | ORAL_TABLET | Freq: Two times a day (BID) | ORAL | 0 refills | Status: DC

## 2019-04-01 NOTE — Progress Notes (Signed)
Andre Stout is a 79 y.o. male with the following history as recorded in EpicCare:  Patient Active Problem List   Diagnosis Date Noted  . Prostate cancer ([place]) 03/25/2019  . Diabetes ([place]) 03/28/2016  . Hypophosphatemia 03/06/2016  . Hypomagnesemia 03/06/2016  . Influenza A 03/05/2016  . Benign essential HTN 03/05/2016  . AKI (acute kidney injury) ([place]) 03/04/2016  . Routine general medical examination at a health care facility 05/02/2015  . Medicare annual wellness visit, subsequent 05/02/2015    Current Outpatient Medications  Medication Sig Dispense Refill  . ALPHAGAN P 0.15 % ophthalmic solution Place 1 drop into the left eye 2 (two) times daily.     . BD INSULIN SYRINGE U/F 31G X 5/16" 1 ML MISC USE 2 DAILY 180 each 4  . Cholecalciferol (VITAMIN D3) 2000 UNITS TABS Take 1 tablet by mouth daily.     Marland Kitchen Cod Liver Oil 1000 MG CAPS Take 1 capsule by mouth daily.     . COMBIGAN 0.2-0.5 % ophthalmic solution Place 1 drop into both eyes 2 (two) times daily.  12  . doxycycline (VIBRA-TABS) 100 MG tablet Take 1 tablet (100 mg total) by mouth 2 (two) times daily. 28 tablet 0  . enalapril (VASOTEC) 2.5 MG tablet Take 1 tablet (2.5 mg total) by mouth daily. 90 tablet 1  . glucose blood (ONE TOUCH ULTRA TEST) test strip USE TO MONITOR GLUCOSE LEVELS 4 TIMES PER DAY E11.9 100 each 1  . insulin NPH-regular Human (HUMULIN 70/30) (70-30) 100 UNIT/ML injection 20 units with breakfast, and 60 with lunch 30 mL 11  . Insulin Syringe-Needle U-100 (INSULIN SYRINGE .3CC/30GX5/16") 30G X 5/16" 0.3 ML MISC Use to inject 3 times per day. 100 each 0  . Misc Natural Products (URINOZINC PO) Take 1 tablet by mouth daily.     . Multiple Vitamins-Minerals (CENTRUM ADULTS PO) Take 1 tablet by mouth daily.     . mupirocin ointment (BACTROBAN) 2 % Apply bid to affected area 30 g 0  . Omega 3 1200 MG CAPS Take 1 capsule by mouth daily.     Glory Rosebush DELICA LANCETS 14H MISC Use to check blood sugar 4 times per day.  Dx code: E11.9 200 each 2  . PRESCRIPTION MEDICATION Gets injections in eyes at eye dr's office     No current facility-administered medications for this visit.     Allergies: Patient has no known allergies.  Past Medical History:  Diagnosis Date  . Diabetes mellitus without complication ([place])   . Prostate cancer ([place])    Been 3-4 years ago    Past Surgical History:  Procedure Laterality Date  . CATARACT EXTRACTION Bilateral     Family History  Problem Relation Age of Onset  . Diabetes Father   . Diabetes Paternal Grandfather     Social History   Tobacco Use  . Smoking status: Former Smoker    Packs/day: 0.50    Years: 4.00    Pack years: 2.00    Types: Cigarettes    Last attempt to quit: 10/29/1974    Years since quitting: 44.4  . Smokeless tobacco: Never Used  Substance Use Topics  . Alcohol use: No    Subjective:  1 week follow-up on lower left leg cellulitis; visit is completed in the parking lot with patient in his truck due to patient concerns regarding COVID-19;  is tolerating the Doxycycline with no difficulty; has been able to use Telfa and Coban to keep area clean; feels  responding well to the treatment; no fever; noticing that both areas of concern on the leg are less red, swollen/ decreased amounts of drainage;     Objective:  Vitals:   04/01/19 1250  BP: 140/86  Pulse: 78  Temp: 98.4 F (36.9 C)  TempSrc: Oral  SpO2: 95%  Height: 5\' 4"  (1.626 m)    General: Well developed, well nourished, in no acute distress  Skin : Warm and dry. 2 areas of cellulitis on lower left leg- one on inner leg and one on outer leg; no streaking noted; inner area still has some pustular drainage noted on exam; redness of skin on entire lower leg is improved today compared to last week;  Head: Normocephalic and atraumatic  Lungs: Respirations unlabored;  Extremities: No pitting edema, cyanosis, clubbing  Vessels: Symmetric bilaterally  Neurologic: Alert and oriented;  speech intact; face symmetrical; moves all extremities well; CNII-XII intact without focal deficit   Assessment:  1. Cellulitis of left lower extremity   2. Prostate cancer ([place])   3. 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 Areas are improved but not healed; extend Doxycycline for another 14 days; will also refill the Bactroban ointment; follow-up for re-check on 4/13- may need wound consult if areas still present at that time. 2. Will refer to urology; most recent PSA was detectable-he had seed implants done but no records are available for review;  3. Patient has deferred treatment with statin even with CAD risks discussed; continue with endocrinology;    No follow-ups on file.  No orders of the defined types were placed in this encounter.   Requested Prescriptions    No prescriptions requested or ordered in this encounter

## 2019-04-14 ENCOUNTER — Encounter: Payer: Self-pay | Admitting: Family

## 2019-04-14 ENCOUNTER — Ambulatory Visit: Payer: Medicare Other | Admitting: Family

## 2019-04-14 ENCOUNTER — Ambulatory Visit (INDEPENDENT_AMBULATORY_CARE_PROVIDER_SITE_OTHER): Payer: Medicare Other | Admitting: Family

## 2019-04-14 ENCOUNTER — Other Ambulatory Visit: Payer: Self-pay

## 2019-04-14 VITALS — BP 138/78 | HR 78 | Temp 98.8°F | Ht 64.0 in

## 2019-04-14 DIAGNOSIS — L03116 Cellulitis of left lower limb: Secondary | ICD-10-CM | POA: Diagnosis not present

## 2019-04-14 MED ORDER — DOXYCYCLINE HYCLATE 100 MG PO TABS: 100.0000 mg | ORAL_TABLET | Freq: Two times a day (BID) | ORAL | 0 refills | Status: DC

## 2019-04-14 MED ORDER — MUPIROCIN 2 % EX OINT: TOPICAL_OINTMENT | CUTANEOUS | 1 refills | Status: DC

## 2019-04-14 NOTE — Progress Notes (Signed)
Andre Stout is a 79 y.o. male with the following history as recorded in EpicCare:  Patient Active Problem List   Diagnosis Date Noted  . Prostate cancer ([place]) 03/25/2019  . Diabetes ([place]) 03/28/2016  . Hypophosphatemia 03/06/2016  . Hypomagnesemia 03/06/2016  . Influenza A 03/05/2016  . Benign essential HTN 03/05/2016  . AKI (acute kidney injury) ([place]) 03/04/2016  . Routine general medical examination at a health care facility 05/02/2015  . Medicare annual wellness visit, subsequent 05/02/2015    Current Outpatient Medications  Medication Sig Dispense Refill  . ALPHAGAN P 0.15 % ophthalmic solution Place 1 drop into the left eye 2 (two) times daily.     . BD INSULIN SYRINGE U/F 31G X 5/16" 1 ML MISC USE 2 DAILY 180 each 4  . Cholecalciferol (VITAMIN D3) 2000 UNITS TABS Take 1 tablet by mouth daily.     Marland Kitchen Cod Liver Oil 1000 MG CAPS Take 1 capsule by mouth daily.     . COMBIGAN 0.2-0.5 % ophthalmic solution Place 1 drop into both eyes 2 (two) times daily.  12  . dorzolamide (TRUSOPT) 2 % ophthalmic solution     . doxycycline (VIBRA-TABS) 100 MG tablet Take 1 tablet (100 mg total) by mouth 2 (two) times daily. 10 tablet 0  . enalapril (VASOTEC) 2.5 MG tablet Take 1 tablet (2.5 mg total) by mouth daily. 90 tablet 1  . glucose blood (ONE TOUCH ULTRA TEST) test strip USE TO MONITOR GLUCOSE LEVELS 4 TIMES PER DAY E11.9 100 each 1  . insulin NPH-regular Human (HUMULIN 70/30) (70-30) 100 UNIT/ML injection 20 units with breakfast, and 60 with lunch 30 mL 11  . Insulin Syringe-Needle U-100 (INSULIN SYRINGE .3CC/30GX5/16") 30G X 5/16" 0.3 ML MISC Use to inject 3 times per day. 100 each 0  . Misc Natural Products (URINOZINC PO) Take 1 tablet by mouth daily.     . Multiple Vitamins-Minerals (CENTRUM ADULTS PO) Take 1 tablet by mouth daily.     . mupirocin ointment (BACTROBAN) 2 % Apply bid to affected area 30 g 1  . Omega 3 1200 MG CAPS Take 1 capsule by mouth daily.     Glory Rosebush DELICA  LANCETS 98P MISC Use to check blood sugar 4 times per day. Dx code: E11.9 200 each 2  . PRESCRIPTION MEDICATION Gets injections in eyes at eye dr's office     No current facility-administered medications for this visit.     Allergies: Patient has no known allergies.  Past Medical History:  Diagnosis Date  . Diabetes mellitus without complication ([place])   . Prostate cancer ([place])    Been 3-4 years ago    Past Surgical History:  Procedure Laterality Date  . CATARACT EXTRACTION Bilateral     Family History  Problem Relation Age of Onset  . Diabetes Father   . Diabetes Paternal Grandfather     Social History   Tobacco Use  . Smoking status: Former Smoker    Packs/day: 0.50    Years: 4.00    Pack years: 2.00    Types: Cigarettes    Last attempt to quit: 10/29/1974    Years since quitting: 44.4  . Smokeless tobacco: Never Used  Substance Use Topics  . Alcohol use: No    Subjective:  2 week follow-up on wounds on lower left extremity; has now completed 20 days of Doxycycline and continued to apply Bactroban; has noticed marked improvement in the wounds/ having very limited swelling or redness in the  lower leg/ no further drainage noted; both areas have scabbed. No fever or pain/ "very excited" about how much better the wounds look. Visit was completed in parking lot of our office as patient not able to do virtual visit and prefers not to come into the office during COVID restrictions;    Objective:  Vitals:   04/14/19 1143  BP: 138/78  Pulse: 78  Temp: 98.8 F (37.1 C)  TempSrc: Oral  SpO2: 94%  Height: 5\' 4"  (1.626 m)    General: Well developed, well nourished, in no acute distress  Skin : Warm and dry. 2 scabbed wounds noted on lower left leg; no pustular drainage noted; no erythema over lower extremity; no warmth or swelling; Head: Normocephalic and atraumatic  Lungs: Respirations unlabored;  Extremities: No edema, cyanosis, clubbing  Vessels: Symmetric bilaterally   Neurologic: Alert and oriented; speech intact; face symmetrical; moves all extremities well; CNII-XII intact without focal deficit   Assessment:  1. Cellulitis of left lower extremity     Plan:  Marked improvement in symptoms; will extend Doxycycline x 10 more days; continue applying Bactroban; follow-up for re-check in 2 weeks.  No follow-ups on file.  No orders of the defined types were placed in this encounter.   Requested Prescriptions    No prescriptions requested or ordered in this encounter

## 2019-04-15 ENCOUNTER — Ambulatory Visit (INDEPENDENT_AMBULATORY_CARE_PROVIDER_SITE_OTHER): Payer: Medicare Other | Admitting: Endocrinology

## 2019-04-15 ENCOUNTER — Other Ambulatory Visit: Payer: Self-pay

## 2019-04-15 DIAGNOSIS — E10319 Type 1 diabetes mellitus with unspecified diabetic retinopathy without macular edema: Secondary | ICD-10-CM | POA: Diagnosis not present

## 2019-04-15 NOTE — Progress Notes (Signed)
Subjective:    Patient ID: Andre Stout, male    DOB: 1940/03/13, 79 y.o.   MRN: [id number]  HPI  Telephone visit.  Total time=10 minutes Pt returns for f/u of diabetes mellitus:  DM type: 1 Dx'ed: 0867 Complications: retinopathy.   Therapy: insulin since 2006.  DKA: once, in 2017.  Severe hypoglycemia: never.  Pancreatitis: never.   Other: emphasizing basal insulin did not help; he did not tolerate pioglitizone (edema); he takes BID insulin, after poor results with multiple daily injections; he was changed to 70/30, due to pattern of cbg's. He is not a pump candidate, due to not recording cbg's.   Interval history: pt states cbg's vary from 50-300.  It is in general higher as the day goes on.  He has mild hypoglycemia approx qod.  Pt says he does not miss the insulin.    Past Medical History:  Diagnosis Date  . Diabetes mellitus without complication ([place])   . Prostate cancer ([place])    Been 3-4 years ago    Past Surgical History:  Procedure Laterality Date  . CATARACT EXTRACTION Bilateral     Social History   Socioeconomic History  . Marital status: Widowed    Spouse name: Not on file  . Number of children: 1  . Years of education: 47  . Highest education level: Not on file  Occupational History  . Occupation: Retired  Scientific laboratory technician  . Financial resource strain: Not on file  . Food insecurity:    Worry: Not on file    Inability: Not on file  . Transportation needs:    Medical: Not on file    Non-medical: Not on file  Tobacco Use  . Smoking status: Former Smoker    Packs/day: 0.50    Years: 4.00    Pack years: 2.00    Types: Cigarettes    Last attempt to quit: 10/29/1974    Years since quitting: 44.4  . Smokeless tobacco: Never Used  Substance and Sexual Activity  . Alcohol use: No  . Drug use: No  . Sexual activity: Not Currently  Lifestyle  . Physical activity:    Days per week: Not on file    Minutes per session: Not on file  . Stress: Not on file   Relationships  . Social connections:    Talks on phone: Not on file    Gets together: Not on file    Attends religious service: Not on file    Active member of club or organization: Not on file    Attends meetings of clubs or organizations: Not on file    Relationship status: Not on file  . Intimate partner violence:    Fear of current or ex partner: Not on file    Emotionally abused: Not on file    Physically abused: Not on file    Forced sexual activity: Not on file  Other Topics Concern  . Not on file  Social History Narrative   Born and raised in [place], [place]. Currently reside in a private residence by himself. Daughter lives in the area. No live. Fun: hunt and fish   Denies religious beliefs that would effect health care.     Current Outpatient Medications on File Prior to Visit  Medication Sig Dispense Refill  . ALPHAGAN P 0.15 % ophthalmic solution Place 1 drop into the left eye 2 (two) times daily.     . BD INSULIN SYRINGE U/F 31G X 5/16" 1 ML MISC USE  2 DAILY 180 each 4  . Cholecalciferol (VITAMIN D3) 2000 UNITS TABS Take 1 tablet by mouth daily.     Marland Kitchen Cod Liver Oil 1000 MG CAPS Take 1 capsule by mouth daily.     . COMBIGAN 0.2-0.5 % ophthalmic solution Place 1 drop into both eyes 2 (two) times daily.  12  . dorzolamide (TRUSOPT) 2 % ophthalmic solution     . doxycycline (VIBRA-TABS) 100 MG tablet Take 1 tablet (100 mg total) by mouth 2 (two) times daily. 20 tablet 0  . enalapril (VASOTEC) 2.5 MG tablet Take 1 tablet (2.5 mg total) by mouth daily. 90 tablet 1  . glucose blood (ONE TOUCH ULTRA TEST) test strip USE TO MONITOR GLUCOSE LEVELS 4 TIMES PER DAY E11.9 100 each 1  . Insulin Syringe-Needle U-100 (INSULIN SYRINGE .3CC/30GX5/16") 30G X 5/16" 0.3 ML MISC Use to inject 3 times per day. 100 each 0  . Misc Natural Products (URINOZINC PO) Take 1 tablet by mouth daily.     . Multiple Vitamins-Minerals (CENTRUM ADULTS PO) Take 1 tablet by mouth daily.     . mupirocin  ointment (BACTROBAN) 2 % Apply bid to affected area 30 g 1  . Omega 3 1200 MG CAPS Take 1 capsule by mouth daily.     Glory Rosebush DELICA LANCETS 59R MISC Use to check blood sugar 4 times per day. Dx code: E11.9 200 each 2  . PRESCRIPTION MEDICATION Gets injections in eyes at eye dr's office     No current facility-administered medications on file prior to visit.     No Known Allergies  Family History  Problem Relation Age of Onset  . Diabetes Father   . Diabetes Paternal Grandfather     There were no vitals taken for this visit.   Review of Systems Denies LOC    Objective:   Physical Exam  Lab Results  Component Value Date   HGBA1C 9.0 (A) 04/16/2019      Assessment & Plan:  Type 1 DM, with DR: he needs increased rx increase the insulin to 25 units with breakfast, and 60 with lunch.

## 2019-04-15 NOTE — Patient Instructions (Addendum)
check your blood sugar twice a day.  vary the time of day when you check, between before the 3 meals, and at bedtime.  also check if you have symptoms of your blood sugar being too high or too low.  please keep a record of the readings and bring it to your next appointment here.  You can write it on any piece of paper.  please call us sooner if your blood sugar goes below 70, or if you have a lot of readings over 200.  Please come in for a1c test.  On this type of insulin schedule, you should eat meals on a regular schedule (especially lunch).  If a meal is missed or significantly delayed, your blood sugar could go low.    Please come back for a follow-up appointment in 2 months.

## 2019-04-16 ENCOUNTER — Ambulatory Visit (INDEPENDENT_AMBULATORY_CARE_PROVIDER_SITE_OTHER): Payer: Medicare Other

## 2019-04-16 DIAGNOSIS — E10311 Type 1 diabetes mellitus with unspecified diabetic retinopathy with macular edema: Secondary | ICD-10-CM | POA: Diagnosis not present

## 2019-04-16 LAB — POCT GLYCOSYLATED HEMOGLOBIN (HGB A1C): Hemoglobin A1C: 9 % — AB (ref 4.0–5.6)

## 2019-04-16 MED ORDER — INSULIN NPH ISOPHANE & REGULAR (70-30) 100 UNIT/ML ~~LOC~~ SUSP: SUBCUTANEOUS | 11 refills | Status: DC

## 2019-04-16 NOTE — Progress Notes (Signed)
Pt presents today for nurse visit for completion of A1C. 

## 2019-04-20 ENCOUNTER — Other Ambulatory Visit: Payer: Self-pay

## 2019-04-20 ENCOUNTER — Encounter (INDEPENDENT_AMBULATORY_CARE_PROVIDER_SITE_OTHER): Payer: Medicare Other | Admitting: Ophthalmology

## 2019-04-20 DIAGNOSIS — I10 Essential (primary) hypertension: Secondary | ICD-10-CM

## 2019-04-20 DIAGNOSIS — H35033 Hypertensive retinopathy, bilateral: Secondary | ICD-10-CM | POA: Diagnosis not present

## 2019-04-20 DIAGNOSIS — E113513 Type 2 diabetes mellitus with proliferative diabetic retinopathy with macular edema, bilateral: Secondary | ICD-10-CM

## 2019-04-20 DIAGNOSIS — H43813 Vitreous degeneration, bilateral: Secondary | ICD-10-CM

## 2019-04-20 DIAGNOSIS — E11311 Type 2 diabetes mellitus with unspecified diabetic retinopathy with macular edema: Secondary | ICD-10-CM | POA: Diagnosis not present

## 2019-04-28 ENCOUNTER — Ambulatory Visit (INDEPENDENT_AMBULATORY_CARE_PROVIDER_SITE_OTHER): Payer: Medicare Other | Admitting: Family

## 2019-04-28 ENCOUNTER — Other Ambulatory Visit (INDEPENDENT_AMBULATORY_CARE_PROVIDER_SITE_OTHER): Payer: Medicare Other

## 2019-04-28 ENCOUNTER — Encounter: Payer: Self-pay | Admitting: Family

## 2019-04-28 VITALS — BP 124/80 | HR 94 | Temp 98.3°F | Ht 64.0 in

## 2019-04-28 DIAGNOSIS — L03116 Cellulitis of left lower limb: Secondary | ICD-10-CM | POA: Diagnosis not present

## 2019-04-28 LAB — CBC WITH DIFFERENTIAL/PLATELET
Basophils Absolute: 0.1 10*3/uL (ref 0.0–0.1)
Basophils Relative: 0.8 % (ref 0.0–3.0)
Eosinophils Absolute: 0.2 10*3/uL (ref 0.0–0.7)
Eosinophils Relative: 1.5 % (ref 0.0–5.0)
HCT: 48.1 % (ref 39.0–52.0)
Hemoglobin: 16.1 g/dL (ref 13.0–17.0)
Lymphocytes Relative: 23.1 % (ref 12.0–46.0)
Lymphs Abs: 2.5 10*3/uL (ref 0.7–4.0)
MCHC: 33.4 g/dL (ref 30.0–36.0)
MCV: 87.4 fl (ref 78.0–100.0)
Monocytes Absolute: 0.8 10*3/uL (ref 0.1–1.0)
Monocytes Relative: 7.8 % (ref 3.0–12.0)
Neutro Abs: 7.2 10*3/uL (ref 1.4–7.7)
Neutrophils Relative %: 66.8 % (ref 43.0–77.0)
Platelets: 257 10*3/uL (ref 150.0–400.0)
RBC: 5.5 Mil/uL (ref 4.22–5.81)
RDW: 14.7 % (ref 11.5–15.5)
WBC: 10.7 10*3/uL — ABNORMAL HIGH (ref 4.0–10.5)

## 2019-04-28 LAB — COMPREHENSIVE METABOLIC PANEL
ALT: 20 U/L (ref 0–53)
AST: 27 U/L (ref 0–37)
Albumin: 3.7 g/dL (ref 3.5–5.2)
Alkaline Phosphatase: 87 U/L (ref 39–117)
BUN: 21 mg/dL (ref 6–23)
CO2: 25 mEq/L (ref 19–32)
Calcium: 9.9 mg/dL (ref 8.4–10.5)
Chloride: 94 mEq/L — ABNORMAL LOW (ref 96–112)
Creatinine, Ser: 1.35 mg/dL (ref 0.40–1.50)
GFR: 51.05 mL/min — ABNORMAL LOW (ref >60.00)
Glucose, Bld: 484 mg/dL — ABNORMAL HIGH (ref 70–99)
Potassium: 4.9 mEq/L (ref 3.5–5.1)
Sodium: 131 mEq/L — ABNORMAL LOW (ref 135–145)
Total Bilirubin: 1.4 mg/dL — ABNORMAL HIGH (ref 0.2–1.2)
Total Protein: 7.7 g/dL (ref 6.0–8.3)

## 2019-04-28 NOTE — Progress Notes (Signed)
Andre Stout is a 79 y.o. male with the following history as recorded in EpicCare:  Patient Active Problem List   Diagnosis Date Noted  . Prostate cancer ([place]) 03/25/2019  . Diabetes ([place]) 03/28/2016  . Hypophosphatemia 03/06/2016  . Hypomagnesemia 03/06/2016  . Influenza A 03/05/2016  . Benign essential HTN 03/05/2016  . AKI (acute kidney injury) ([place]) 03/04/2016  . Routine general medical examination at a health care facility 05/02/2015  . Medicare annual wellness visit, subsequent 05/02/2015    Current Outpatient Medications  Medication Sig Dispense Refill  . ALPHAGAN P 0.15 % ophthalmic solution Place 1 drop into the left eye 2 (two) times daily.     . BD INSULIN SYRINGE U/F 31G X 5/16" 1 ML MISC USE 2 DAILY 180 each 4  . Cholecalciferol (VITAMIN D3) 2000 UNITS TABS Take 1 tablet by mouth daily.     Marland Kitchen Cod Liver Oil 1000 MG CAPS Take 1 capsule by mouth daily.     . COMBIGAN 0.2-0.5 % ophthalmic solution Place 1 drop into both eyes 2 (two) times daily.  12  . dorzolamide (TRUSOPT) 2 % ophthalmic solution     . doxycycline (VIBRA-TABS) 100 MG tablet Take 1 tablet (100 mg total) by mouth 2 (two) times daily. 20 tablet 0  . enalapril (VASOTEC) 2.5 MG tablet Take 1 tablet (2.5 mg total) by mouth daily. 90 tablet 1  . glucose blood (ONE TOUCH ULTRA TEST) test strip USE TO MONITOR GLUCOSE LEVELS 4 TIMES PER DAY E11.9 100 each 1  . insulin NPH-regular Human (HUMULIN 70/30) (70-30) 100 UNIT/ML injection 25 units with breakfast, and 60 with lunch 30 mL 11  . Insulin Syringe-Needle U-100 (INSULIN SYRINGE .3CC/30GX5/16") 30G X 5/16" 0.3 ML MISC Use to inject 3 times per day. 100 each 0  . Misc Natural Products (URINOZINC PO) Take 1 tablet by mouth daily.     . Multiple Vitamins-Minerals (CENTRUM ADULTS PO) Take 1 tablet by mouth daily.     . mupirocin ointment (BACTROBAN) 2 % Apply bid to affected area 30 g 1  . Omega 3 1200 MG CAPS Take 1 capsule by mouth daily.     Glory Rosebush DELICA  LANCETS 98X MISC Use to check blood sugar 4 times per day. Dx code: E11.9 200 each 2  . PRESCRIPTION MEDICATION Gets injections in eyes at eye dr's office    . trimethoprim-polymyxin b (POLYTRIM) ophthalmic solution      No current facility-administered medications for this visit.     Allergies: Patient has no known allergies.  Past Medical History:  Diagnosis Date  . Diabetes mellitus without complication ([place])   . Prostate cancer ([place])    Been 3-4 years ago    Past Surgical History:  Procedure Laterality Date  . CATARACT EXTRACTION Bilateral     Family History  Problem Relation Age of Onset  . Diabetes Father   . Diabetes Paternal Grandfather     Social History   Tobacco Use  . Smoking status: Former Smoker    Packs/day: 0.50    Years: 4.00    Pack years: 2.00    Types: Cigarettes    Last attempt to quit: 10/29/1974    Years since quitting: 44.5  . Smokeless tobacco: Never Used  Substance Use Topics  . Alcohol use: No    Subjective:  2 week follow-up on cellulitis; at last OV, Doxycycline was extended for 10 more days; has been off oral medicine x 4 days; has continued to  use topical Bactroban; is very pleased with response; no further redness or swelling in lower extremity; no pain at areas of concern; in general, feeling very good;  Visit is completed in the parking lot in patient's truck per his request; prefers not to come into the office today;      Objective:  Vitals:   04/28/19 1153  BP: 124/80  Pulse: 94  Temp: 98.3 F (36.8 C)  TempSrc: Oral  SpO2: 97%  Height: 5' 4" (1.626 m)    General: Well developed, well nourished, in no acute distress  Skin : Warm and dry. Lesion on lateral side of lower leg has closed and no pustular drainage noted; lesion on medial side of calf has small pustular pocket seen- decreased in size from previous exams;  Head: Normocephalic and atraumatic  Lungs: Respirations unlabored;  Musculoskeletal: No deformities; no active  joint inflammation  Extremities: No edema, cyanosis, clubbing  Neurologic: Alert and oriented; speech intact; face symmetrical; moves all extremities well; CNII-XII intact without focal deficit   Assessment:  1. Cellulitis of left lower extremity     Plan:  Continued improvement noted; will update CBC and CMP today due to length of time he has been using Doxycycline; assuming labs are normal, extend the Doxycycline x 5 more days and continue Bactroban; will not schedule specific follow-up at this time.   No follow-ups on file.  Orders Placed This Encounter  Procedures  . CBC w/Diff    Standing Status:   Future    Number of Occurrences:   1    Standing Expiration Date:   04/27/2020  . Comp Met (CMET)    Standing Status:   Future    Number of Occurrences:   1    Standing Expiration Date:   04/27/2020    Requested Prescriptions    No prescriptions requested or ordered in this encounter

## 2019-04-29 ENCOUNTER — Other Ambulatory Visit: Payer: Self-pay | Admitting: Family

## 2019-04-29 MED ORDER — DOXYCYCLINE HYCLATE 100 MG PO TABS: 100.0000 mg | ORAL_TABLET | Freq: Two times a day (BID) | ORAL | 0 refills | Status: DC

## 2019-05-13 ENCOUNTER — Ambulatory Visit: Payer: Medicare Other | Admitting: Family

## 2019-05-15 ENCOUNTER — Other Ambulatory Visit: Payer: Self-pay

## 2019-05-15 ENCOUNTER — Encounter: Payer: Self-pay | Admitting: Family

## 2019-05-15 ENCOUNTER — Ambulatory Visit (INDEPENDENT_AMBULATORY_CARE_PROVIDER_SITE_OTHER): Payer: Medicare Other | Admitting: Family

## 2019-05-15 VITALS — BP 118/70 | HR 74 | Temp 98.4°F

## 2019-05-15 DIAGNOSIS — L03116 Cellulitis of left lower limb: Secondary | ICD-10-CM | POA: Diagnosis not present

## 2019-05-15 NOTE — Progress Notes (Signed)
Andre Stout is a 79 y.o. male with the following history as recorded in EpicCare:  Patient Active Problem List   Diagnosis Date Noted  . Prostate cancer ([place]) 03/25/2019  . Diabetes ([place]) 03/28/2016  . Hypophosphatemia 03/06/2016  . Hypomagnesemia 03/06/2016  . Influenza A 03/05/2016  . Benign essential HTN 03/05/2016  . AKI (acute kidney injury) ([place]) 03/04/2016  . Routine general medical examination at a health care facility 05/02/2015  . Medicare annual wellness visit, subsequent 05/02/2015    Current Outpatient Medications  Medication Sig Dispense Refill  . ALPHAGAN P 0.15 % ophthalmic solution Place 1 drop into the left eye 2 (two) times daily.     . BD INSULIN SYRINGE U/F 31G X 5/16" 1 ML MISC USE 2 DAILY 180 each 4  . Cholecalciferol (VITAMIN D3) 2000 UNITS TABS Take 1 tablet by mouth daily.     Marland Kitchen Cod Liver Oil 1000 MG CAPS Take 1 capsule by mouth daily.     . COMBIGAN 0.2-0.5 % ophthalmic solution Place 1 drop into both eyes 2 (two) times daily.  12  . dorzolamide (TRUSOPT) 2 % ophthalmic solution     . doxycycline (VIBRA-TABS) 100 MG tablet Take 1 tablet (100 mg total) by mouth 2 (two) times daily. 10 tablet 0  . enalapril (VASOTEC) 2.5 MG tablet Take 1 tablet (2.5 mg total) by mouth daily. 90 tablet 1  . glucose blood (ONE TOUCH ULTRA TEST) test strip USE TO MONITOR GLUCOSE LEVELS 4 TIMES PER DAY E11.9 100 each 1  . insulin NPH-regular Human (HUMULIN 70/30) (70-30) 100 UNIT/ML injection 25 units with breakfast, and 60 with lunch 30 mL 11  . Insulin Syringe-Needle U-100 (INSULIN SYRINGE .3CC/30GX5/16") 30G X 5/16" 0.3 ML MISC Use to inject 3 times per day. 100 each 0  . Misc Natural Products (URINOZINC PO) Take 1 tablet by mouth daily.     . Multiple Vitamins-Minerals (CENTRUM ADULTS PO) Take 1 tablet by mouth daily.     . mupirocin ointment (BACTROBAN) 2 % Apply bid to affected area 30 g 1  . Omega 3 1200 MG CAPS Take 1 capsule by mouth daily.     Glory Rosebush DELICA  LANCETS 57Q MISC Use to check blood sugar 4 times per day. Dx code: E11.9 200 each 2  . PRESCRIPTION MEDICATION Gets injections in eyes at eye dr's office    . trimethoprim-polymyxin b (POLYTRIM) ophthalmic solution      No current facility-administered medications for this visit.     Allergies: Patient has no known allergies.  Past Medical History:  Diagnosis Date  . Diabetes mellitus without complication ([place])   . Prostate cancer ([place])    Been 3-4 years ago    Past Surgical History:  Procedure Laterality Date  . CATARACT EXTRACTION Bilateral     Family History  Problem Relation Age of Onset  . Diabetes Father   . Diabetes Paternal Grandfather     Social History   Tobacco Use  . Smoking status: Former Smoker    Packs/day: 0.50    Years: 4.00    Pack years: 2.00    Types: Cigarettes    Last attempt to quit: 10/29/1974    Years since quitting: 44.5  . Smokeless tobacco: Never Used  Substance Use Topics  . Alcohol use: No    Subjective:  2 week follow-up on cellulitis of left lower extremity; areas of concern have been slowly improving and have been closely monitoring due to history of uncontrolled  diabetes; has been off Doxycycline for almost 2 weeks now; no further pain or swelling; no drainage; no fever; patient very excited about how good both of the areas of concern look today; Is continuing to work with his endocrinologist to gain control of his blood sugars;      Objective:  Vitals:   05/15/19 1317  BP: 118/70  Pulse: 74  Temp: 98.4 F (36.9 C)  TempSrc: Oral  SpO2: 95%    General: Well developed, well nourished, in no acute distress  Skin : Warm and dry. Lateral area of concern has healed completely; small scabbed area noted on lower medial leg; no warmth or swelling noted;  Head: Normocephalic and atraumatic  Lungs: Respirations unlabored;  Extremities: No edema, cyanosis, clubbing  Vessels: Symmetric bilaterally  Neurologic: Alert and oriented;  speech intact; face symmetrical; moves all extremities well; CNII-XII intact without focal deficit   Assessment:  1. Cellulitis of left lower extremity     Plan:  Marked improvement- both areas almost healed completely; continue to apply Bactroban to affected area x 1 week; do not feel needs continued monitoring at this time; follow-up as needed otherwise.   No follow-ups on file.  No orders of the defined types were placed in this encounter.   Requested Prescriptions    No prescriptions requested or ordered in this encounter

## 2019-05-21 ENCOUNTER — Other Ambulatory Visit: Payer: Self-pay | Admitting: Endocrinology

## 2019-05-27 ENCOUNTER — Telehealth: Payer: Self-pay | Admitting: *Deleted

## 2019-05-27 NOTE — Telephone Encounter (Signed)
Called and LVM with both patient and daughter stating that nurse would like to convert face-to face office visit to a telephone visit. Nurse also stated that she needs to ask covid-19 screening questions if patient does come to the office. Nurse requested that both individuals call her back and her contact number was provided.

## 2019-05-28 ENCOUNTER — Ambulatory Visit (INDEPENDENT_AMBULATORY_CARE_PROVIDER_SITE_OTHER): Payer: Medicare Other | Admitting: *Deleted

## 2019-05-28 DIAGNOSIS — Z0000 Encounter for general adult medical examination without abnormal findings: Secondary | ICD-10-CM | POA: Diagnosis not present

## 2019-05-28 NOTE — Patient Instructions (Addendum)
Continue to eat heart healthy diet (full of fruits, vegetables, whole grains, lean protein, water--limit salt, fat, and sugar intake) and increase physical activity as tolerated.  Continue doing brain stimulating activities (puzzles, reading, adult coloring books, staying active) to keep memory sharp.    Andre Stout , Thank you for taking time to come for your Medicare Wellness Visit. I appreciate your ongoing commitment to your health goals. Please review the following plan we discussed and let me know if I can assist you in the future.   These are the goals we discussed: Goals    . Patient Stated     Maintain current health status.        This is a list of the screening recommended for you and due dates:  Health Maintenance  Topic Date Due  . Flu Shot  08/01/2019  . Hemoglobin A1C  10/16/2019  . Eye exam for diabetics  01/07/2020  . Complete foot exam   02/12/2020  . Tetanus Vaccine  05/21/2026  . Pneumonia vaccines  Completed    Preventive Care 10 Years and Older, Male Preventive care refers to lifestyle choices and visits with your health care provider that can promote health and wellness. What does preventive care include?   A yearly physical exam. This is also called an annual well check.  Dental exams once or twice a year.  Routine eye exams. Ask your health care provider how often you should have your eyes checked.  Personal lifestyle choices, including: ? Daily care of your teeth and gums. ? Regular physical activity. ? Eating a healthy diet. ? Avoiding tobacco and drug use. ? Limiting alcohol use. ? Practicing safe sex. ? Taking low doses of aspirin every day. ? Taking vitamin and mineral supplements as recommended by your health care provider. What happens during an annual well check? The services and screenings done by your health care provider during your annual well check will depend on your age, overall health, lifestyle risk factors, and family history of  disease. Counseling Your health care provider may ask you questions about your:  Alcohol use.  Tobacco use.  Drug use.  Emotional well-being.  Home and relationship well-being.  Sexual activity.  Eating habits.  History of falls.  Memory and ability to understand (cognition).  Work and work Statistician. Screening You may have the following tests or measurements:  Height, weight, and BMI.  Blood pressure.  Lipid and cholesterol levels. These may be checked every 5 years, or more frequently if you are over 29 years old.  Skin check.  Lung cancer screening. You may have this screening every year starting at age 45 if you have a 30-pack-year history of smoking and currently smoke or have quit within the past 15 years.  Colorectal cancer screening. All adults should have this screening starting at age 52 and continuing until age 33. You will have tests every 1-10 years, depending on your results and the type of screening test. People at increased risk should start screening at an earlier age. Screening tests may include: ? Guaiac-based fecal occult blood testing. ? Fecal immunochemical test (FIT). ? Stool DNA test. ? Virtual colonoscopy. ? Sigmoidoscopy. During this test, a flexible tube with a tiny camera (sigmoidoscope) is used to examine your rectum and lower colon. The sigmoidoscope is inserted through your anus into your rectum and lower colon. ? Colonoscopy. During this test, a long, thin, flexible tube with a tiny camera (colonoscope) is used to examine your entire colon and  rectum.  Prostate cancer screening. Recommendations will vary depending on your family history and other risks.  Hepatitis C blood test.  Hepatitis B blood test.  Sexually transmitted disease (STD) testing.  Diabetes screening. This is done by checking your blood sugar (glucose) after you have not eaten for a while (fasting). You may have this done every 1-3 years.  Abdominal aortic  aneurysm (AAA) screening. You may need this if you are a current or former smoker.  Osteoporosis. You may be screened starting at age 54 if you are at high risk. Talk with your health care provider about your test results, treatment options, and if necessary, the need for more tests. Vaccines Your health care provider may recommend certain vaccines, such as:  Influenza vaccine. This is recommended every year.  Tetanus, diphtheria, and acellular pertussis (Tdap, Td) vaccine. You may need a Td booster every 10 years.  Varicella vaccine. You may need this if you have not been vaccinated.  Zoster vaccine. You may need this after age 51.  Measles, mumps, and rubella (MMR) vaccine. You may need at least one dose of MMR if you were born in 1957 or later. You may also need a second dose.  Pneumococcal 13-valent conjugate (PCV13) vaccine. One dose is recommended after age 75.  Pneumococcal polysaccharide (PPSV23) vaccine. One dose is recommended after age 75.  Meningococcal vaccine. You may need this if you have certain conditions.  Hepatitis A vaccine. You may need this if you have certain conditions or if you travel or work in places where you may be exposed to hepatitis A.  Hepatitis B vaccine. You may need this if you have certain conditions or if you travel or work in places where you may be exposed to hepatitis B.  Haemophilus influenzae type b (Hib) vaccine. You may need this if you have certain risk factors. Talk to your health care provider about which screenings and vaccines you need and how often you need them. This information is not intended to replace advice given to you by your health care provider. Make sure you discuss any questions you have with your health care provider. Document Released: 01/13/2016 Document Revised: 02/06/2018 Document Reviewed: 10/18/2015 Elsevier Interactive Patient Education  2019 [employer].

## 2019-05-28 NOTE — Progress Notes (Addendum)
Subjective:   Andre Stout is a 79 y.o. male who presents for Medicare Annual/Subsequent preventive examination. I connected with patient by a telephone and verified that I am speaking with the correct person using two identifiers. Patient stated full name and DOB. Patient gave permission to continue with telephonic visit. Patient's location was at home and Nurse's location was at [place] office.   Review of Systems:  No ROS.  Medicare Wellness Virtual Visit.  Visual/audio telehealth visit, UTA vital signs.   See social history for additional risk factors.  Cardiac Risk Factors include: advanced age (>40men, >82 women);diabetes mellitus;male gender;hypertension Sleep patterns: feels rested on waking, gets up 0-2 times nightly to void and sleeps 6-7 hours nightly.    Home Safety/Smoke Alarms: Feels safe in home. Smoke alarms in place.  Living environment; residence and Firearm Safety: 1-story house/ trailer. Lives alone, no needs for DME, good support system Seat Belt Safety/Bike Helmet: Wears seat belt.     Objective:    Vitals: There were no vitals taken for this visit.  There is no height or weight on file to calculate BMI.  Advanced Directives 05/28/2019 03/04/2016 10/29/2014  Does Patient Have a Medical Advance Directive? No No No  Would patient like information on creating a medical advance directive? Yes (ED - Information included in AVS) - No - patient declined information    Tobacco Social History   Tobacco Use  Smoking Status Former Smoker  . Packs/day: 0.50  . Years: 4.00  . Pack years: 2.00  . Types: Cigarettes  . Last attempt to quit: 10/29/1974  . Years since quitting: 44.6  Smokeless Tobacco Never Used     Counseling given: Not Answered  Past Medical History:  Diagnosis Date  . Diabetes mellitus without complication ([place])   . Glaucoma   . Prostate cancer ([place])    Been 3-4 years ago   Past Surgical History:  Procedure Laterality Date  . CATARACT  EXTRACTION Bilateral    Family History  Problem Relation Age of Onset  . Diabetes Father   . Diabetes Paternal Grandfather    Social History   Socioeconomic History  . Marital status: Widowed    Spouse name: Not on file  . Number of children: 1  . Years of education: 31  . Highest education level: Not on file  Occupational History  . Occupation: Retired  Scientific laboratory technician  . Financial resource strain: Not hard at all  . Food insecurity:    Worry: Never true    Inability: Never true  . Transportation needs:    Medical: No    Non-medical: No  Tobacco Use  . Smoking status: Former Smoker    Packs/day: 0.50    Years: 4.00    Pack years: 2.00    Types: Cigarettes    Last attempt to quit: 10/29/1974    Years since quitting: 44.6  . Smokeless tobacco: Never Used  Substance and Sexual Activity  . Alcohol use: No  . Drug use: No  . Sexual activity: Not Currently  Lifestyle  . Physical activity:    Days per week: 0 days    Minutes per session: 0 min  . Stress: Not at all  Relationships  . Social connections:    Talks on phone: More than three times a week    Gets together: More than three times a week    Attends religious service: Not on file    Active member of club or organization: Yes  Attends meetings of clubs or organizations: More than 4 times per year    Relationship status: Widowed  Other Topics Concern  . Not on file  Social History Narrative   Born and raised in [place], [place]. Currently reside in a private residence by himself. Daughter lives in the area. No live. Fun: hunt and fish   Denies religious beliefs that would effect health care.     Outpatient Encounter Medications as of 05/28/2019  Medication Sig  . ALPHAGAN P 0.15 % ophthalmic solution Place 1 drop into the left eye 2 (two) times daily.   . BD INSULIN SYRINGE U/F 31G X 5/16" 1 ML MISC USE 2 DAILY  . Cholecalciferol (VITAMIN D3) 2000 UNITS TABS Take 1 tablet by mouth daily.   Marland Kitchen Cod Liver Oil 1000  MG CAPS Take 1 capsule by mouth daily.   . COMBIGAN 0.2-0.5 % ophthalmic solution Place 1 drop into both eyes 2 (two) times daily.  . dorzolamide (TRUSOPT) 2 % ophthalmic solution   . enalapril (VASOTEC) 2.5 MG tablet Take 1 tablet (2.5 mg total) by mouth daily.  Marland Kitchen glucose blood (ONE TOUCH ULTRA TEST) test strip USE TO MONITOR GLUCOSE LEVELS 4 TIMES PER DAY E11.9  . insulin NPH-regular Human (HUMULIN 70/30) (70-30) 100 UNIT/ML injection 25 units with breakfast, and 60 with lunch  . Insulin Syringe-Needle U-100 (INSULIN SYRINGE .3CC/30GX5/16") 30G X 5/16" 0.3 ML MISC Use to inject 3 times per day.  . Misc Natural Products (URINOZINC PO) Take 1 tablet by mouth daily.   . Multiple Vitamins-Minerals (CENTRUM ADULTS PO) Take 1 tablet by mouth daily.   . mupirocin ointment (BACTROBAN) 2 % Apply bid to affected area  . Omega 3 1200 MG CAPS Take 1 capsule by mouth daily.   Glory Rosebush DELICA LANCETS 11B MISC Use to check blood sugar 4 times per day. Dx code: E11.9  . PRESCRIPTION MEDICATION Gets injections in eyes at eye dr's office  . trimethoprim-polymyxin b (POLYTRIM) ophthalmic solution    No facility-administered encounter medications on file as of 05/28/2019.     Activities of Daily Living In your present state of health, do you have any difficulty performing the following activities: 05/28/2019  Hearing? N  Vision? N  Difficulty concentrating or making decisions? N  Walking or climbing stairs? N  Dressing or bathing? N  Doing errands, shopping? N  Preparing Food and eating ? N  Using the Toilet? N  In the past six months, have you accidently leaked urine? N  Do you have problems with loss of bowel control? N  Managing your Medications? N  Managing your Finances? N  Housekeeping or managing your Housekeeping? N  Some recent data might be hidden    Patient Care Team: [name], [place] as PCP - General (Internal Medicine) Renato Shin, MD as Consulting Physician  (Endocrinology)   Assessment:   This is a routine wellness examination for [place]. Physical assessment deferred to PCP.  Exercise Activities and Dietary recommendations Current Exercise Habits: The patient does not participate in regular exercise at present(usually goes to the gym but it is close due to covid-19), Exercise limited by: None identified  Diet (meal preparation, eat out, water intake, caffeinated beverages, dairy products, fruits and vegetables): in general, a "healthy" diet  , well balanced   Reviewed heart healthy and diabetic diet. Encouraged patient to increase daily water and healthy fluid intake. States Dr. Loanne Drilling has provided diet education.  Goals   None  Fall Risk Fall Risk  05/28/2019 05/21/2016 05/02/2015  Falls in the past year? 0 No No  Number falls in past yr: 0 - -   Depression Screen PHQ 2/9 Scores 05/28/2019 03/25/2019 05/21/2016 05/02/2015  PHQ - 2 Score 0 0 0 0    Cognitive Function       Ad8 score reviewed for issues:  Issues making decisions: no  Less interest in hobbies / activities: no  Repeats questions, stories (family complaining): no  Trouble using ordinary gadgets (microwave, computer, phone):no  Forgets the month or year: no  Mismanaging finances: no  Remembering appts: no  Daily problems with thinking and/or memory: no Ad8 score is= 0  Immunization History  Administered Date(s) Administered  . Influenza, High Dose Seasonal PF 11/05/2015, 11/26/2017, 09/22/2018  . Influenza-Unspecified 09/22/2018  . Pneumococcal Conjugate-13 05/02/2015  . Pneumococcal Polysaccharide-23 05/21/2016  . Tdap 05/21/2016   Screening Tests Health Maintenance  Topic Date Due  . INFLUENZA VACCINE  08/01/2019  . HEMOGLOBIN A1C  10/16/2019  . OPHTHALMOLOGY EXAM  01/07/2020  . FOOT EXAM  02/12/2020  . TETANUS/TDAP  05/21/2026  . PNA vac Low Risk Adult  Completed      Plan:    Reviewed health maintenance screenings with patient today and  relevant education, vaccines, and/or referrals were provided.   Continue to eat heart healthy diet (full of fruits, vegetables, whole grains, lean protein, water--limit salt, fat, and sugar intake) and increase physical activity as tolerated.  Continue doing brain stimulating activities (puzzles, reading, adult coloring books, staying active) to keep memory sharp.   I have personally reviewed and noted the following in the patient's chart:   . Medical and social history . Use of alcohol, tobacco or illicit drugs  . Current medications and supplements . Functional ability and status . Nutritional status . Physical activity . Advanced directives . List of other physicians . Screenings to include cognitive, depression, and falls . Referrals and appointments  In addition, I have reviewed and discussed with patient certain preventive protocols, quality metrics, and best practice recommendations. A written personalized care plan for preventive services as well as general preventive health recommendations were provided to patient.    Medical screening examination/treatment/procedure(s) were performed by non-physician practitioner and as supervising physician I was immediately available for consultation/collaboration. I agree with above. Scarlette Calico, MD    Michiel Cowboy, RN  05/28/2019

## 2019-06-01 ENCOUNTER — Encounter (INDEPENDENT_AMBULATORY_CARE_PROVIDER_SITE_OTHER): Payer: Medicare Other | Admitting: Ophthalmology

## 2019-06-01 ENCOUNTER — Other Ambulatory Visit: Payer: Self-pay

## 2019-06-01 DIAGNOSIS — E11311 Type 2 diabetes mellitus with unspecified diabetic retinopathy with macular edema: Secondary | ICD-10-CM | POA: Diagnosis not present

## 2019-06-01 DIAGNOSIS — E113513 Type 2 diabetes mellitus with proliferative diabetic retinopathy with macular edema, bilateral: Secondary | ICD-10-CM | POA: Diagnosis not present

## 2019-06-01 DIAGNOSIS — H35033 Hypertensive retinopathy, bilateral: Secondary | ICD-10-CM | POA: Diagnosis not present

## 2019-06-01 DIAGNOSIS — I10 Essential (primary) hypertension: Secondary | ICD-10-CM

## 2019-06-01 DIAGNOSIS — H43813 Vitreous degeneration, bilateral: Secondary | ICD-10-CM

## 2019-06-09 ENCOUNTER — Other Ambulatory Visit: Payer: Self-pay | Admitting: Endocrinology

## 2019-06-29 ENCOUNTER — Other Ambulatory Visit: Payer: Self-pay

## 2019-06-29 ENCOUNTER — Ambulatory Visit (INDEPENDENT_AMBULATORY_CARE_PROVIDER_SITE_OTHER): Payer: Medicare Other | Admitting: Endocrinology

## 2019-06-29 ENCOUNTER — Encounter: Payer: Self-pay | Admitting: Endocrinology

## 2019-06-29 VITALS — BP 108/60 | HR 88 | Ht 64.0 in | Wt 186.6 lb

## 2019-06-29 DIAGNOSIS — E10311 Type 1 diabetes mellitus with unspecified diabetic retinopathy with macular edema: Secondary | ICD-10-CM

## 2019-06-29 LAB — POCT GLYCOSYLATED HEMOGLOBIN (HGB A1C): Hemoglobin A1C: 9 % — AB (ref 4.0–5.6)

## 2019-06-29 MED ORDER — HUMULIN 70/30 (70-30) 100 UNIT/ML ~~LOC~~ SUSP: SUBCUTANEOUS | 11 refills | Status: DC

## 2019-06-29 NOTE — Progress Notes (Signed)
Subjective:    Patient ID: Andre Stout, male    DOB: 1940-11-15, 79 y.o.   MRN: [id number]  HPI Pt returns for f/u of diabetes mellitus:  DM type: 1 Dx'ed: 4982 Complications: retinopathy.   Therapy: insulin since 2006.  DKA: once, in 2017.  Severe hypoglycemia: never.  Pancreatitis: never.   Other: emphasizing basal insulin did not help; he did not tolerate pioglitizone (edema); he takes BID insulin, after poor results with multiple daily injections; he was changed to 70/30, due to pattern of cbg's. He is not a pump candidate, due to not recording cbg's.   Interval history: pt states cbg's vary from 45-300.   It is in general highest fasting.  However, he says this is because he eats at HS, in order to avoid nocturnal hypoglycemia.  he seldom has hypoglycemia, and these episodes are mild.  This happens in the afternoon, when a meal is missed.  Pt says he does not miss the insulin.  Past Medical History:  Diagnosis Date  . Diabetes mellitus without complication ([place])   . Glaucoma   . Prostate cancer ([place])    Been 3-4 years ago    Past Surgical History:  Procedure Laterality Date  . CATARACT EXTRACTION Bilateral     Social History   Socioeconomic History  . Marital status: Widowed    Spouse name: Not on file  . Number of children: 1  . Years of education: 32  . Highest education level: Not on file  Occupational History  . Occupation: Retired  Scientific laboratory technician  . Financial resource strain: Not hard at all  . Food insecurity    Worry: Never true    Inability: Never true  . Transportation needs    Medical: No    Non-medical: No  Tobacco Use  . Smoking status: Former Smoker    Packs/day: 0.50    Years: 4.00    Pack years: 2.00    Types: Cigarettes    Quit date: 10/29/1974    Years since quitting: 44.6  . Smokeless tobacco: Never Used  Substance and Sexual Activity  . Alcohol use: No  . Drug use: No  . Sexual activity: Not Currently  Lifestyle  . Physical  activity    Days per week: 0 days    Minutes per session: 0 min  . Stress: Not at all  Relationships  . Social connections    Talks on phone: More than three times a week    Gets together: More than three times a week    Attends religious service: Not on file    Active member of club or organization: Yes    Attends meetings of clubs or organizations: More than 4 times per year    Relationship status: Widowed  . Intimate partner violence    Fear of current or ex partner: Not on file    Emotionally abused: Not on file    Physically abused: Not on file    Forced sexual activity: Not on file  Other Topics Concern  . Not on file  Social History Narrative   Born and raised in [place], [place]. Currently reside in a private residence by himself. Daughter lives in the area. No live. Fun: hunt and fish   Denies religious beliefs that would effect health care.     Current Outpatient Medications on File Prior to Visit  Medication Sig Dispense Refill  . ALPHAGAN P 0.15 % ophthalmic solution Place 1 drop into the left eye 2 (  two) times daily.     . BD INSULIN SYRINGE U/F 31G X 5/16" 1 ML MISC USE 2 DAILY 180 each 4  . Cholecalciferol (VITAMIN D3) 2000 UNITS TABS Take 1 tablet by mouth daily.     Marland Kitchen Cod Liver Oil 1000 MG CAPS Take 1 capsule by mouth daily.     . COMBIGAN 0.2-0.5 % ophthalmic solution Place 1 drop into both eyes 2 (two) times daily.  12  . dorzolamide (TRUSOPT) 2 % ophthalmic solution     . enalapril (VASOTEC) 2.5 MG tablet Take 1 tablet (2.5 mg total) by mouth daily. 90 tablet 1  . Insulin Syringe-Needle U-100 (INSULIN SYRINGE .3CC/30GX5/16") 30G X 5/16" 0.3 ML MISC Use to inject 3 times per day. 100 each 0  . Misc Natural Products (URINOZINC PO) Take 1 tablet by mouth daily.     . Multiple Vitamins-Minerals (CENTRUM ADULTS PO) Take 1 tablet by mouth daily.     . mupirocin ointment (BACTROBAN) 2 % Apply bid to affected area 30 g 1  . Omega 3 1200 MG CAPS Take 1 capsule by mouth  daily.     Glory Rosebush DELICA LANCETS 02V MISC Use to check blood sugar 4 times per day. Dx code: E11.9 200 each 2  . ONETOUCH ULTRA test strip USE TO MONITOR GLUCOSE LEVELS 4 TIMES PER DAY E11.9 100 each 2  . PRESCRIPTION MEDICATION Gets injections in eyes at eye dr's office    . trimethoprim-polymyxin b (POLYTRIM) ophthalmic solution      No current facility-administered medications on file prior to visit.     No Known Allergies  Family History  Problem Relation Age of Onset  . Diabetes Father   . Diabetes Paternal Grandfather     BP 108/60 (BP Location: Left Arm, Patient Position: Sitting, Cuff Size: Large)   Pulse 88   Ht 5\' 4"  (1.626 m)   Wt 186 lb 9.6 oz (84.6 kg)   SpO2 94%   BMI 32.03 kg/m    Review of Systems Denies LOC.      Objective:   Physical Exam VITAL SIGNS:  See vs page GENERAL: no distress Pulses: dorsalis pedis intact bilat.   MSK: no deformity of the feet CV: trace bilat leg edema Skin:  no ulcer on the feet.  normal color and temp on the feet. Neuro: sensation is intact to touch on the feet Ext: there is bilateral onychomycosis of the toenails.    Lab Results  Component Value Date   HGBA1C 9.0 (A) 06/29/2019       Assessment & Plan:  Insulin-requiring type 2 DM: Hypoglycemia: this limits aggressiveness of glycemic control Edema: This limits rx options   Patient Instructions  check your blood sugar twice a day.  vary the time of day when you check, between before the 3 meals, and at bedtime.  also check if you have symptoms of your blood sugar being too high or too low.  please keep a record of the readings and bring it to your next appointment here.  You can write it on any piece of paper.  please call us sooner if your blood sugar goes below 70, or if you have a lot of readings over 200.  Please increase the insulin to 30 units with breakfast, and 65 with lunch.  On this type of insulin schedule, you should eat meals on a regular schedule  (especially lunch).  If a meal is missed or significantly delayed, your blood sugar could go  low.  As we are increasing the insulin, this is more important than ever.  Please come back for a follow-up appointment in 2 months.

## 2019-06-29 NOTE — Patient Instructions (Addendum)
check your blood sugar twice a day.  vary the time of day when you check, between before the 3 meals, and at bedtime.  also check if you have symptoms of your blood sugar being too high or too low.  please keep a record of the readings and bring it to your next appointment here.  You can write it on any piece of paper.  please call us sooner if your blood sugar goes below 70, or if you have a lot of readings over 200.  Please increase the insulin to 30 units with breakfast, and 65 with lunch.  On this type of insulin schedule, you should eat meals on a regular schedule (especially lunch).  If a meal is missed or significantly delayed, your blood sugar could go low.  As we are increasing the insulin, this is more important than ever.  Please come back for a follow-up appointment in 2 months.

## 2019-07-13 ENCOUNTER — Encounter (INDEPENDENT_AMBULATORY_CARE_PROVIDER_SITE_OTHER): Payer: Medicare Other | Admitting: Ophthalmology

## 2019-07-13 ENCOUNTER — Other Ambulatory Visit: Payer: Self-pay

## 2019-07-13 DIAGNOSIS — I10 Essential (primary) hypertension: Secondary | ICD-10-CM | POA: Diagnosis not present

## 2019-07-13 DIAGNOSIS — H35033 Hypertensive retinopathy, bilateral: Secondary | ICD-10-CM | POA: Diagnosis not present

## 2019-07-13 DIAGNOSIS — E113513 Type 2 diabetes mellitus with proliferative diabetic retinopathy with macular edema, bilateral: Secondary | ICD-10-CM | POA: Diagnosis not present

## 2019-07-13 DIAGNOSIS — E11311 Type 2 diabetes mellitus with unspecified diabetic retinopathy with macular edema: Secondary | ICD-10-CM

## 2019-07-13 DIAGNOSIS — H43813 Vitreous degeneration, bilateral: Secondary | ICD-10-CM

## 2019-08-24 ENCOUNTER — Other Ambulatory Visit: Payer: Self-pay

## 2019-08-24 ENCOUNTER — Encounter (INDEPENDENT_AMBULATORY_CARE_PROVIDER_SITE_OTHER): Payer: Medicare Other | Admitting: Ophthalmology

## 2019-08-24 DIAGNOSIS — E113513 Type 2 diabetes mellitus with proliferative diabetic retinopathy with macular edema, bilateral: Secondary | ICD-10-CM | POA: Diagnosis not present

## 2019-08-24 DIAGNOSIS — H43813 Vitreous degeneration, bilateral: Secondary | ICD-10-CM

## 2019-08-24 DIAGNOSIS — I10 Essential (primary) hypertension: Secondary | ICD-10-CM | POA: Diagnosis not present

## 2019-08-24 DIAGNOSIS — H35033 Hypertensive retinopathy, bilateral: Secondary | ICD-10-CM | POA: Diagnosis not present

## 2019-08-24 DIAGNOSIS — E11311 Type 2 diabetes mellitus with unspecified diabetic retinopathy with macular edema: Secondary | ICD-10-CM | POA: Diagnosis not present

## 2019-08-31 ENCOUNTER — Other Ambulatory Visit: Payer: Self-pay

## 2019-08-31 ENCOUNTER — Encounter: Payer: Self-pay | Admitting: Endocrinology

## 2019-08-31 ENCOUNTER — Ambulatory Visit (INDEPENDENT_AMBULATORY_CARE_PROVIDER_SITE_OTHER): Payer: Medicare Other | Admitting: Endocrinology

## 2019-08-31 VITALS — BP 132/70 | HR 92 | Ht 64.0 in | Wt 186.0 lb

## 2019-08-31 DIAGNOSIS — E10311 Type 1 diabetes mellitus with unspecified diabetic retinopathy with macular edema: Secondary | ICD-10-CM

## 2019-08-31 LAB — POCT GLYCOSYLATED HEMOGLOBIN (HGB A1C): Hemoglobin A1C: 8.2 % — AB (ref 4.0–5.6)

## 2019-08-31 NOTE — Patient Instructions (Addendum)
check your blood sugar twice a day.  vary the time of day when you check, between before the 3 meals, and at bedtime.  also check if you have symptoms of your blood sugar being too high or too low.  please keep a record of the readings and bring it to your next appointment here.  You can write it on any piece of paper.  please call us sooner if your blood sugar goes below 70, or if you have a lot of readings over 200.  Please continue the same insulin.  On this type of insulin schedule, you should eat meals on a regular schedule (especially lunch).  If a meal is missed or significantly delayed, your blood sugar could go low.  As we are increasing the insulin, this is more important than ever.  Please come back for a follow-up appointment in 3 months.

## 2019-08-31 NOTE — Progress Notes (Signed)
Subjective:    Patient ID: Andre Stout, male    DOB: [birth year], 79 y.o.   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  DKA: once, in 2017.  Severe hypoglycemia: never.  Pancreatitis: never.   Other: emphasizing basal insulin did not help; he did not tolerate pioglitizone (edema); he takes BID insulin, after poor results with multiple daily injections; he was changed to 70/30, due to pattern of cbg's. Also due to pattern of cbg's, he need to take 70/30 insulin with breakfast and with lunch; He is not a pump candidate, due to not recording cbg's.   Interval history: pt states cbg's vary from 60-400.   It is in general highest at HS, and lowest in the afternoon (when he eats a light lunch). he seldom has hypoglycemia, and these episodes are mild.  pt states he feels well in general.   Pt says he does not miss the insulin.  Past Medical History:  Diagnosis Date  . Diabetes mellitus without complication ([place])   . Glaucoma   . Prostate cancer ([place])    Been 3-4 years ago    Past Surgical History:  Procedure Laterality Date  . CATARACT EXTRACTION Bilateral     Social History   Socioeconomic History  . Marital status: Widowed    Spouse name: Not on file  . Number of children: 1  . Years of education: 37  . Highest education level: Not on file  Occupational History  . Occupation: Retired  Scientific laboratory technician  . Financial resource strain: Not hard at all  . Food insecurity    Worry: Never true    Inability: Never true  . Transportation needs    Medical: No    Non-medical: No  Tobacco Use  . Smoking status: Former Smoker    Packs/day: 0.50    Years: 4.00    Pack years: 2.00    Types: Cigarettes    Quit date: 10/29/1974    Years since quitting: 44.8  . Smokeless tobacco: Never Used  Substance and Sexual Activity  . Alcohol use: No  . Drug use: No  . Sexual activity:  Not Currently  Lifestyle  . Physical activity    Days per week: 0 days    Minutes per session: 0 min  . Stress: Not at all  Relationships  . Social connections    Talks on phone: More than three times a week    Gets together: More than three times a week    Attends religious service: Not on file    Active member of club or organization: Yes    Attends meetings of clubs or organizations: More than 4 times per year    Relationship status: Widowed  . Intimate partner violence    Fear of current or ex partner: Not on file    Emotionally abused: Not on file    Physically abused: Not on file    Forced sexual activity: Not on file  Other Topics Concern  . Not on file  Social History Narrative   Born and raised in [place], [place]. Currently reside in a private residence by himself. Daughter lives in the area. No live. Fun: hunt and fish   Denies religious beliefs that would effect health care.     Current Outpatient Medications on File Prior to Visit  Medication Sig Dispense Refill  . ALPHAGAN P 0.15 %  ophthalmic solution Place 1 drop into the left eye 2 (two) times daily.     . BD INSULIN SYRINGE U/F 31G X 5/16" 1 ML MISC USE 2 DAILY 180 each 4  . Cholecalciferol (VITAMIN D3) 2000 UNITS TABS Take 1 tablet by mouth daily.     Marland Kitchen Cod Liver Oil 1000 MG CAPS Take 1 capsule by mouth daily.     . COMBIGAN 0.2-0.5 % ophthalmic solution Place 1 drop into both eyes 2 (two) times daily.  12  . dorzolamide (TRUSOPT) 2 % ophthalmic solution     . enalapril (VASOTEC) 2.5 MG tablet Take 1 tablet (2.5 mg total) by mouth daily. 90 tablet 1  . insulin NPH-regular Human (HUMULIN 70/30) (70-30) 100 UNIT/ML injection 30 units with breakfast, and 65 with lunch 30 mL 11  . Misc Natural Products (URINOZINC PO) Take 1 tablet by mouth daily.     . Multiple Vitamins-Minerals (CENTRUM ADULTS PO) Take 1 tablet by mouth daily.     . mupirocin ointment (BACTROBAN) 2 % Apply bid to affected area 30 g 1  . Omega 3 1200  MG CAPS Take 1 capsule by mouth daily.     Glory Rosebush DELICA LANCETS 99991111 MISC Use to check blood sugar 4 times per day. Dx code: E11.9 200 each 2  . ONETOUCH ULTRA test strip USE TO MONITOR GLUCOSE LEVELS 4 TIMES PER DAY E11.9 100 each 2  . PRESCRIPTION MEDICATION Gets injections in eyes at eye dr's office    . trimethoprim-polymyxin b (POLYTRIM) ophthalmic solution      No current facility-administered medications on file prior to visit.     No Known Allergies  Family History  Problem Relation Age of Onset  . Diabetes Father   . Diabetes Paternal Grandfather     BP 132/70 (BP Location: Left Arm, Patient Position: Sitting, Cuff Size: Large)   Pulse 92   Ht 5\' 4"  (1.626 m)   Wt 186 lb (84.4 kg)   SpO2 95%   BMI 31.93 kg/m    Review of Systems He denies LOC    Objective:   Physical Exam VITAL SIGNS:  See vs page GENERAL: no distress Pulses: dorsalis pedis intact bilat.   MSK: no deformity of the feet CV: trace bilat leg edema Skin:  no ulcer on the feet, but the skin is dry.  normal color and temp on the feet. Neuro: sensation is intact to touch on the feet Ext: there is bilateral onychomycosis of the toenails  Lab Results  Component Value Date   HGBA1C 8.2 (A) 08/31/2019    Lab Results  Component Value Date   CREATININE 1.35 04/28/2019   BUN 21 04/28/2019   NA 131 (L) 04/28/2019   K 4.9 04/28/2019   CL 94 (L) 04/28/2019   CO2 25 04/28/2019      Assessment & Plan:  type 1 , with DR: this is the best control this pt should aim for, given variable cbg's Hypoglycemia: this limits aggressiveness of glycemic control Renal insuff: this is the like reason why he does not need PM insulin   Patient Instructions  check your blood sugar twice a day.  vary the time of day when you check, between before the 3 meals, and at bedtime.  also check if you have symptoms of your blood sugar being too high or too low.  please keep a record of the readings and bring it to your  next appointment here.  You can write it on  any piece of paper.  please call us sooner if your blood sugar goes below 70, or if you have a lot of readings over 200.  Please continue the same insulin.  On this type of insulin schedule, you should eat meals on a regular schedule (especially lunch).  If a meal is missed or significantly delayed, your blood sugar could go low.  As we are increasing the insulin, this is more important than ever.  Please come back for a follow-up appointment in 3 months.

## 2019-10-05 ENCOUNTER — Encounter (INDEPENDENT_AMBULATORY_CARE_PROVIDER_SITE_OTHER): Payer: Medicare Other | Admitting: Ophthalmology

## 2019-10-05 DIAGNOSIS — E11311 Type 2 diabetes mellitus with unspecified diabetic retinopathy with macular edema: Secondary | ICD-10-CM

## 2019-10-05 DIAGNOSIS — E113513 Type 2 diabetes mellitus with proliferative diabetic retinopathy with macular edema, bilateral: Secondary | ICD-10-CM | POA: Diagnosis not present

## 2019-10-05 DIAGNOSIS — I10 Essential (primary) hypertension: Secondary | ICD-10-CM | POA: Diagnosis not present

## 2019-10-05 DIAGNOSIS — H35033 Hypertensive retinopathy, bilateral: Secondary | ICD-10-CM | POA: Diagnosis not present

## 2019-10-05 DIAGNOSIS — H43813 Vitreous degeneration, bilateral: Secondary | ICD-10-CM

## 2019-11-30 ENCOUNTER — Other Ambulatory Visit: Payer: Self-pay

## 2019-11-30 ENCOUNTER — Ambulatory Visit (INDEPENDENT_AMBULATORY_CARE_PROVIDER_SITE_OTHER): Payer: Medicare Other | Admitting: Endocrinology

## 2019-11-30 VITALS — BP 118/62 | HR 68 | Temp 98.2°F | Ht 64.0 in | Wt 191.4 lb

## 2019-11-30 DIAGNOSIS — E10311 Type 1 diabetes mellitus with unspecified diabetic retinopathy with macular edema: Secondary | ICD-10-CM

## 2019-11-30 LAB — POCT GLYCOSYLATED HEMOGLOBIN (HGB A1C): Hemoglobin A1C: 7.2 % — AB (ref 4.0–5.6)

## 2019-11-30 MED ORDER — HUMULIN 70/30 (70-30) 100 UNIT/ML ~~LOC~~ SUSP: SUBCUTANEOUS | 11 refills | Status: DC

## 2019-11-30 NOTE — Patient Instructions (Addendum)
check your blood sugar twice a day.  vary the time of day when you check, between before the 3 meals, and at bedtime.  also check if you have symptoms of your blood sugar being too high or too low.  please keep a record of the readings and bring it to your next appointment here.  You can write it on any piece of paper.  please call us sooner if your blood sugar goes below 70, or if you have a lot of readings over 200.  Please reduce the insulin to 20 units with breakfast, and 60 with lunch On this type of insulin schedule, you should eat meals on a regular schedule (especially lunch).  If a meal is missed or significantly delayed, your blood sugar could go low.  As we are increasing the insulin, this is more important than ever.  Please come back for a follow-up appointment in 3 months.

## 2019-11-30 NOTE — Progress Notes (Signed)
Subjective:    Patient ID: Andre Stout, male    DOB: 02-28-1940, 79 y.o.   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  DKA: once, in 2017.  Severe hypoglycemia: never.  Pancreatitis: never.   Other: emphasizing basal insulin did not help; he did not tolerate pioglitizone (edema); he takes BID insulin, after poor results with multiple daily injections; he was changed to 70/30, due to pattern of cbg's. Also due to pattern of cbg's, he needs to take 70/30 insulin with breakfast and with lunch; He is not a pump candidate, due to not recording cbg's; he takes human insulin, due to cost.    Interval history: no cbg record, but states cbg's vary from 50-200.   It is in general highest fasting, and lowest in the afternoon (when he eats a light lunch). he seldom has hypoglycemia, and these episodes are mild.  pt states he feels well in general.   Pt says he does not miss the insulin.  He takes 25 units with breakfast, and 60 units with lunch.   Past Medical History:  Diagnosis Date  . Diabetes mellitus without complication ([place])   . Glaucoma   . Prostate cancer ([place])    Been 3-4 years ago    Past Surgical History:  Procedure Laterality Date  . CATARACT EXTRACTION Bilateral     Social History   Socioeconomic History  . Marital status: Widowed    Spouse name: Not on file  . Number of children: 1  . Years of education: 19  . Highest education level: Not on file  Occupational History  . Occupation: Retired  Scientific laboratory technician  . Financial resource strain: Not hard at all  . Food insecurity    Worry: Never true    Inability: Never true  . Transportation needs    Medical: No    Non-medical: No  Tobacco Use  . Smoking status: Former Smoker    Packs/day: 0.50    Years: 4.00    Pack years: 2.00    Types: Cigarettes    Quit date: 10/29/1974    Years since quitting: 45.1  .  Smokeless tobacco: Never Used  Substance and Sexual Activity  . Alcohol use: No  . Drug use: No  . Sexual activity: Not Currently  Lifestyle  . Physical activity    Days per week: 0 days    Minutes per session: 0 min  . Stress: Not at all  Relationships  . Social connections    Talks on phone: More than three times a week    Gets together: More than three times a week    Attends religious service: Not on file    Active member of club or organization: Yes    Attends meetings of clubs or organizations: More than 4 times per year    Relationship status: Widowed  . Intimate partner violence    Fear of current or ex partner: Not on file    Emotionally abused: Not on file    Physically abused: Not on file    Forced sexual activity: Not on file  Other Topics Concern  . Not on file  Social History Narrative   Born and raised in [place], [place]. Currently reside in a private residence by himself. Daughter lives in the area. No live. Fun: hunt and fish   Denies religious beliefs that would effect health care.  Current Outpatient Medications on File Prior to Visit  Medication Sig Dispense Refill  . ALPHAGAN P 0.15 % ophthalmic solution Place 1 drop into the left eye 2 (two) times daily.     . BD INSULIN SYRINGE U/F 31G X 5/16" 1 ML MISC USE 2 DAILY 180 each 4  . Cholecalciferol (VITAMIN D3) 2000 UNITS TABS Take 1 tablet by mouth daily.     Marland Kitchen Cod Liver Oil 1000 MG CAPS Take 1 capsule by mouth daily.     . COMBIGAN 0.2-0.5 % ophthalmic solution Place 1 drop into both eyes 2 (two) times daily.  12  . dorzolamide (TRUSOPT) 2 % ophthalmic solution     . enalapril (VASOTEC) 2.5 MG tablet Take 1 tablet (2.5 mg total) by mouth daily. 90 tablet 1  . Misc Natural Products (URINOZINC PO) Take 1 tablet by mouth daily.     . Multiple Vitamins-Minerals (CENTRUM ADULTS PO) Take 1 tablet by mouth daily.     . mupirocin ointment (BACTROBAN) 2 % Apply bid to affected area 30 g 1  . Omega 3 1200 MG CAPS  Take 1 capsule by mouth daily.     Glory Rosebush DELICA LANCETS 99991111 MISC Use to check blood sugar 4 times per day. Dx code: E11.9 200 each 2  . ONETOUCH ULTRA test strip USE TO MONITOR GLUCOSE LEVELS 4 TIMES PER DAY E11.9 100 each 2  . PRESCRIPTION MEDICATION Gets injections in eyes at eye dr's office    . trimethoprim-polymyxin b (POLYTRIM) ophthalmic solution      No current facility-administered medications on file prior to visit.     No Known Allergies  Family History  Problem Relation Age of Onset  . Diabetes Father   . Diabetes Paternal Grandfather     BP 118/62 (BP Location: Left Arm, Patient Position: Sitting, Cuff Size: Large)   Pulse 68   Temp 98.2 F (36.8 C)   Ht 5\' 4"  (1.626 m)   Wt 191 lb 6.4 oz (86.8 kg)   SpO2 96%   BMI 32.85 kg/m    Review of Systems He denies LOC    Objective:   Physical Exam VITAL SIGNS:  See vs page GENERAL: no distress Pulses: dorsalis pedis intact bilat.   MSK: no deformity of the feet CV: no leg edema Skin:  no ulcer on the feet.  normal color and temp on the feet. Neuro: sensation is intact to touch on the feet  Lab Results  Component Value Date   HGBA1C 7.2 (A) 11/30/2019   Lab Results  Component Value Date   CREATININE 1.35 04/28/2019   BUN 21 04/28/2019   NA 131 (L) 04/28/2019   K 4.9 04/28/2019   CL 94 (L) 04/28/2019   CO2 25 04/28/2019       Assessment & Plan:  Type 1 DM, with DR: this is the best control this pt should aim for, given this regimen, which does match insulin to his changing needs throughout the day Hypoglycemia: this limits aggressiveness of glycemic control renal insuff: in this setting, pt needs most of his insulin in the morning.   Patient Instructions  check your blood sugar twice a day.  vary the time of day when you check, between before the 3 meals, and at bedtime.  also check if you have symptoms of your blood sugar being too high or too low.  please keep a record of the readings and bring  it to your next appointment here.  You can  write it on any piece of paper.  please call us sooner if your blood sugar goes below 70, or if you have a lot of readings over 200.  Please reduce the insulin to 20 units with breakfast, and 60 with lunch On this type of insulin schedule, you should eat meals on a regular schedule (especially lunch).  If a meal is missed or significantly delayed, your blood sugar could go low.  As we are increasing the insulin, this is more important than ever.  Please come back for a follow-up appointment in 3 months.

## 2019-12-02 ENCOUNTER — Encounter (INDEPENDENT_AMBULATORY_CARE_PROVIDER_SITE_OTHER): Payer: Medicare Other | Admitting: Ophthalmology

## 2019-12-02 DIAGNOSIS — E11311 Type 2 diabetes mellitus with unspecified diabetic retinopathy with macular edema: Secondary | ICD-10-CM | POA: Diagnosis not present

## 2019-12-02 DIAGNOSIS — E113513 Type 2 diabetes mellitus with proliferative diabetic retinopathy with macular edema, bilateral: Secondary | ICD-10-CM | POA: Diagnosis not present

## 2019-12-02 DIAGNOSIS — H35033 Hypertensive retinopathy, bilateral: Secondary | ICD-10-CM | POA: Diagnosis not present

## 2019-12-02 DIAGNOSIS — I10 Essential (primary) hypertension: Secondary | ICD-10-CM

## 2019-12-02 DIAGNOSIS — H43813 Vitreous degeneration, bilateral: Secondary | ICD-10-CM

## 2020-01-27 ENCOUNTER — Other Ambulatory Visit: Payer: Self-pay

## 2020-01-27 ENCOUNTER — Encounter (INDEPENDENT_AMBULATORY_CARE_PROVIDER_SITE_OTHER): Payer: Medicare Other | Admitting: Ophthalmology

## 2020-01-27 DIAGNOSIS — E113513 Type 2 diabetes mellitus with proliferative diabetic retinopathy with macular edema, bilateral: Secondary | ICD-10-CM | POA: Diagnosis not present

## 2020-01-27 DIAGNOSIS — E11311 Type 2 diabetes mellitus with unspecified diabetic retinopathy with macular edema: Secondary | ICD-10-CM | POA: Diagnosis not present

## 2020-01-27 DIAGNOSIS — I10 Essential (primary) hypertension: Secondary | ICD-10-CM

## 2020-01-27 DIAGNOSIS — H35033 Hypertensive retinopathy, bilateral: Secondary | ICD-10-CM

## 2020-01-27 DIAGNOSIS — H43813 Vitreous degeneration, bilateral: Secondary | ICD-10-CM

## 2020-01-27 LAB — HM DIABETES EYE EXAM

## 2020-01-30 ENCOUNTER — Ambulatory Visit: Payer: Medicare Other

## 2020-02-04 ENCOUNTER — Ambulatory Visit: Payer: Medicare Other

## 2020-02-06 ENCOUNTER — Ambulatory Visit: Payer: Medicare Other | Attending: Internal Medicine

## 2020-02-06 DIAGNOSIS — Z23 Encounter for immunization: Secondary | ICD-10-CM | POA: Insufficient documentation

## 2020-02-06 NOTE — Progress Notes (Signed)
   Covid-19 Vaccination Clinic  Name:  Andre Stout    MRN: [id number] DOB: 1940/08/27  02/06/2020  Andre Stout was observed post Covid-19 immunization for 15 minutes without incidence. He was provided with Vaccine Information Sheet and instruction to access the V-Safe system.   Andre Stout was instructed to call 911 with any severe reactions post vaccine: Marland Kitchen Difficulty breathing  . Swelling of your face and throat  . A fast heartbeat  . A bad rash all over your body  . Dizziness and weakness    Immunizations Administered    Name Date Dose VIS Date Route   Pfizer COVID-19 Vaccine 02/06/2020 12:29 PM 0.3 mL 12/11/2019 Intramuscular   Manufacturer: [place]   Lot: [id number]   [place]: [id number]

## 2020-02-26 ENCOUNTER — Other Ambulatory Visit: Payer: Self-pay

## 2020-03-01 ENCOUNTER — Encounter: Payer: Self-pay | Admitting: Endocrinology

## 2020-03-01 ENCOUNTER — Other Ambulatory Visit: Payer: Self-pay

## 2020-03-01 ENCOUNTER — Ambulatory Visit (INDEPENDENT_AMBULATORY_CARE_PROVIDER_SITE_OTHER): Payer: Medicare Other | Admitting: Endocrinology

## 2020-03-01 VITALS — BP 116/70 | HR 84 | Ht 64.0 in | Wt 193.4 lb

## 2020-03-01 DIAGNOSIS — E10311 Type 1 diabetes mellitus with unspecified diabetic retinopathy with macular edema: Secondary | ICD-10-CM | POA: Diagnosis not present

## 2020-03-01 LAB — POCT GLYCOSYLATED HEMOGLOBIN (HGB A1C): Hemoglobin A1C: 8.2 % — AB (ref 4.0–5.6)

## 2020-03-01 MED ORDER — HUMULIN 70/30 (70-30) 100 UNIT/ML ~~LOC~~ SUSP: SUBCUTANEOUS | 11 refills | Status: DC

## 2020-03-01 NOTE — Patient Instructions (Addendum)
check your blood sugar twice a day.  vary the time of day when you check, between before the 3 meals, and at bedtime.  also check if you have symptoms of your blood sugar being too high or too low.  please keep a record of the readings and bring it to your next appointment here.  You can write it on any piece of paper.  please call us sooner if your blood sugar goes below 70, or if you have a lot of readings over 200.  Please change the insulin to 15 units with breakfast, and 65 with lunch.  On this type of insulin schedule, you should eat meals on a regular schedule (especially lunch).  If a meal is missed or significantly delayed, your blood sugar could go low.  Please come back for a follow-up appointment in 2 months.

## 2020-03-01 NOTE — Progress Notes (Signed)
Subjective:    Patient ID: Andre Stout, male    DOB: [birth year], 80 y.o.   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  DKA: once, in 2017.  Severe hypoglycemia: once (2015) Pancreatitis: never.   SDOH: He is not a pump candidate, due to not recording cbg's; he takes human insulin, due to cost.   Other: emphasizing basal insulin did not help; he did not tolerate pioglitizone (edema); he takes BID insulin, after poor results with multiple daily injections; he was changed to 70/30, due to pattern of cbg's. Also due to pattern of cbg's, he needs to take 70/30 insulin with breakfast and with lunch.    Interval history: no cbg record, but states cbg's vary from 65-200.   It is in general highest fasting, and lowest in the afternoon (when he eats a light lunch). he seldom has hypoglycemia, and these episodes are mild.  pt states he feels well in general.   Pt says he does not miss the insulin.   Past Medical History:  Diagnosis Date  . Diabetes mellitus without complication ([place])   . Glaucoma   . Prostate cancer ([place])    Been 3-4 years ago    Past Surgical History:  Procedure Laterality Date  . CATARACT EXTRACTION Bilateral     Social History   Socioeconomic History  . Marital status: Widowed    Spouse name: Not on file  . Number of children: 1  . Years of education: 42  . Highest education level: Not on file  Occupational History  . Occupation: Retired  Tobacco Use  . Smoking status: Former Smoker    Packs/day: 0.50    Years: 4.00    Pack years: 2.00    Types: Cigarettes    Quit date: 10/29/1974    Years since quitting: 45.3  . Smokeless tobacco: Never Used  Substance and Sexual Activity  . Alcohol use: No  . Drug use: No  . Sexual activity: Not Currently  Other Topics Concern  . Not on file  Social History Narrative   Born and raised in [place], [place].  Currently reside in a private residence by himself. Daughter lives in the area. No live. Fun: hunt and fish   Denies religious beliefs that would effect health care.    Social Determinants of Health   Financial Resource Strain: Low Risk   . Difficulty of Paying Living Expenses: Not hard at all  Food Insecurity: No Food Insecurity  . Worried About Charity fundraiser in the Last Year: Never true  . Ran Out of Food in the Last Year: Never true  Transportation Needs: No Transportation Needs  . Lack of Transportation (Medical): No  . Lack of Transportation (Non-Medical): No  Physical Activity: Inactive  . Days of Exercise per Week: 0 days  . Minutes of Exercise per Session: 0 min  Stress: No Stress Concern Present  . Feeling of Stress : Not at all  Social Connections: Unknown  . Frequency of Communication with Friends and Family: More than three times a week  . Frequency of Social Gatherings with Friends and Family: More than three times a week  . Attends Religious Services: Not on file  . Active Member of Clubs or Organizations: Yes  . Attends Archivist Meetings: More than 4 times per year  . Marital Status: Widowed  Intimate Partner Violence:   .  Fear of Current or Ex-Partner: Not on file  . Emotionally Abused: Not on file  . Physically Abused: Not on file  . Sexually Abused: Not on file    Current Outpatient Medications on File Prior to Visit  Medication Sig Dispense Refill  . ALPHAGAN P 0.15 % ophthalmic solution Place 1 drop into the left eye 2 (two) times daily.     . BD INSULIN SYRINGE U/F 31G X 5/16" 1 ML MISC USE 2 DAILY 180 each 4  . Cholecalciferol (VITAMIN D3) 2000 UNITS TABS Take 1 tablet by mouth daily.     Marland Kitchen Cod Liver Oil 1000 MG CAPS Take 1 capsule by mouth daily.     . COMBIGAN 0.2-0.5 % ophthalmic solution Place 1 drop into both eyes 2 (two) times daily.  12  . dorzolamide (TRUSOPT) 2 % ophthalmic solution     . enalapril (VASOTEC) 2.5 MG tablet Take 1  tablet (2.5 mg total) by mouth daily. 90 tablet 1  . Misc Natural Products (URINOZINC PO) Take 1 tablet by mouth daily.     . Multiple Vitamins-Minerals (CENTRUM ADULTS PO) Take 1 tablet by mouth daily.     . mupirocin ointment (BACTROBAN) 2 % Apply bid to affected area 30 g 1  . Omega 3 1200 MG CAPS Take 1 capsule by mouth daily.     Glory Rosebush DELICA LANCETS 99991111 MISC Use to check blood sugar 4 times per day. Dx code: E11.9 200 each 2  . ONETOUCH ULTRA test strip USE TO MONITOR GLUCOSE LEVELS 4 TIMES PER DAY E11.9 100 each 2  . PRESCRIPTION MEDICATION Gets injections in eyes at eye dr's office    . trimethoprim-polymyxin b (POLYTRIM) ophthalmic solution      No current facility-administered medications on file prior to visit.    No Known Allergies  Family History  Problem Relation Age of Onset  . Diabetes Father   . Diabetes Paternal Grandfather     BP 116/70 (BP Location: Left Arm, Patient Position: Sitting, Cuff Size: Large)   Pulse 84   Ht 5\' 4"  (1.626 m)   Wt 193 lb 6.4 oz (87.7 kg)   SpO2 96%   BMI 33.20 kg/m    Review of Systems Denies LOC    Objective:   Physical Exam VITAL SIGNS:  See vs page GENERAL: no distress Pulses: dorsalis pedis intact bilat.   MSK: no deformity of the feet CV: 2+ bilat leg and foot edema Skin:  no ulcer on the feet.  normal color and temp on the feet. Neuro: sensation is intact to touch on the feet.   Lab Results  Component Value Date   HGBA1C 8.2 (A) 03/01/2020    Lab Results  Component Value Date   CREATININE 1.35 04/28/2019   BUN 21 04/28/2019   NA 131 (L) 04/28/2019   K 4.9 04/28/2019   CL 94 (L) 04/28/2019   CO2 25 04/28/2019       Assessment & Plan:  Type 1 DM, with DR: worse Renal insuff: this is the likely reason why he needs all of his insulin early in the day. Hypoglycemia: this limits aggressiveness of glycemic control  Patient Instructions  check your blood sugar twice a day.  vary the time of day when you  check, between before the 3 meals, and at bedtime.  also check if you have symptoms of your blood sugar being too high or too low.  please keep a record of the readings and bring it  to your next appointment here.  You can write it on any piece of paper.  please call us sooner if your blood sugar goes below 70, or if you have a lot of readings over 200.  Please change the insulin to 15 units with breakfast, and 65 with lunch.  On this type of insulin schedule, you should eat meals on a regular schedule (especially lunch).  If a meal is missed or significantly delayed, your blood sugar could go low.  Please come back for a follow-up appointment in 2 months.

## 2020-03-02 ENCOUNTER — Ambulatory Visit: Payer: Medicare Other | Attending: Internal Medicine

## 2020-03-02 DIAGNOSIS — Z23 Encounter for immunization: Secondary | ICD-10-CM

## 2020-03-02 NOTE — Progress Notes (Signed)
   Covid-19 Vaccination Clinic  Name:  WYLAN SHELTON    MRN: [id number] DOB: [birth year]  03/02/2020  Mr. Roeth was observed post Covid-19 immunization for 15 minutes without incident. He was provided with Vaccine Information Sheet and instruction to access the V-Safe system.   Mr. Denova was instructed to call 911 with any severe reactions post vaccine: Marland Kitchen Difficulty breathing  . Swelling of face and throat  . A fast heartbeat  . A bad rash all over body  . Dizziness and weakness   Immunizations Administered    Name Date Dose VIS Date Route   Pfizer COVID-19 Vaccine 03/02/2020  8:45 AM 0.3 mL 12/11/2019 Intramuscular   Manufacturer: [place]   Lot: [id number]   [place]: [id number]

## 2020-03-16 ENCOUNTER — Encounter (INDEPENDENT_AMBULATORY_CARE_PROVIDER_SITE_OTHER): Payer: Medicare Other | Admitting: Ophthalmology

## 2020-03-16 ENCOUNTER — Other Ambulatory Visit: Payer: Self-pay

## 2020-03-16 DIAGNOSIS — E11311 Type 2 diabetes mellitus with unspecified diabetic retinopathy with macular edema: Secondary | ICD-10-CM

## 2020-03-16 DIAGNOSIS — E113513 Type 2 diabetes mellitus with proliferative diabetic retinopathy with macular edema, bilateral: Secondary | ICD-10-CM

## 2020-03-16 DIAGNOSIS — I10 Essential (primary) hypertension: Secondary | ICD-10-CM

## 2020-03-16 DIAGNOSIS — H35033 Hypertensive retinopathy, bilateral: Secondary | ICD-10-CM

## 2020-03-16 DIAGNOSIS — H43813 Vitreous degeneration, bilateral: Secondary | ICD-10-CM

## 2020-05-04 ENCOUNTER — Encounter (INDEPENDENT_AMBULATORY_CARE_PROVIDER_SITE_OTHER): Payer: Medicare Other | Admitting: Ophthalmology

## 2020-05-04 ENCOUNTER — Other Ambulatory Visit: Payer: Self-pay

## 2020-05-04 ENCOUNTER — Ambulatory Visit: Payer: Medicare Other | Admitting: Endocrinology

## 2020-05-04 DIAGNOSIS — E11311 Type 2 diabetes mellitus with unspecified diabetic retinopathy with macular edema: Secondary | ICD-10-CM

## 2020-05-04 DIAGNOSIS — I10 Essential (primary) hypertension: Secondary | ICD-10-CM

## 2020-05-04 DIAGNOSIS — H43813 Vitreous degeneration, bilateral: Secondary | ICD-10-CM

## 2020-05-04 DIAGNOSIS — E113513 Type 2 diabetes mellitus with proliferative diabetic retinopathy with macular edema, bilateral: Secondary | ICD-10-CM

## 2020-05-04 DIAGNOSIS — H35033 Hypertensive retinopathy, bilateral: Secondary | ICD-10-CM

## 2020-05-06 ENCOUNTER — Other Ambulatory Visit: Payer: Self-pay

## 2020-05-10 ENCOUNTER — Encounter: Payer: Self-pay | Admitting: Endocrinology

## 2020-05-10 ENCOUNTER — Ambulatory Visit (INDEPENDENT_AMBULATORY_CARE_PROVIDER_SITE_OTHER): Payer: Medicare Other | Admitting: Endocrinology

## 2020-05-10 ENCOUNTER — Other Ambulatory Visit: Payer: Self-pay

## 2020-05-10 VITALS — BP 120/70 | HR 75 | Ht 64.0 in | Wt 194.0 lb

## 2020-05-10 DIAGNOSIS — E10311 Type 1 diabetes mellitus with unspecified diabetic retinopathy with macular edema: Secondary | ICD-10-CM

## 2020-05-10 LAB — POCT GLYCOSYLATED HEMOGLOBIN (HGB A1C): Hemoglobin A1C: 8.5 % — AB (ref 4.0–5.6)

## 2020-05-10 MED ORDER — HUMULIN 70/30 (70-30) 100 UNIT/ML ~~LOC~~ SUSP: SUBCUTANEOUS | 11 refills | Status: DC

## 2020-05-10 NOTE — Progress Notes (Signed)
Subjective:    Patient ID: Andre Stout, male    DOB: 05/27/1940, 80 y.o.   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  DKA: once, in 2017.  Severe hypoglycemia: once (2015) Pancreatitis: never.   SDOH: He is not a pump candidate, due to not recording cbg's; he takes human insulin, due to cost.   Other: emphasizing basal insulin did not help; he did not tolerate pioglitizone (edema); he takes BID insulin, after poor results with multiple daily injections; he was changed to 70/30, due to pattern of cbg's. Also due to pattern of cbg's, he needs to take 70/30 insulin with breakfast and with lunch.    Interval history: no cbg record, but states cbg's vary from 55-300.  It is in general highest fasting, and lowest in the afternoon (when he eats a light lunch). he seldom has hypoglycemia, and these episodes are mild.  pt states he feels well in general.   Pt says he does not miss the insulin.    Past Medical History:  Diagnosis Date  . Diabetes mellitus without complication ([place])   . Glaucoma   . Prostate cancer ([place])    Been 3-4 years ago    Past Surgical History:  Procedure Laterality Date  . CATARACT EXTRACTION Bilateral     Social History   Socioeconomic History  . Marital status: Widowed    Spouse name: Not on file  . Number of children: 1  . Years of education: 76  . Highest education level: Not on file  Occupational History  . Occupation: Retired  Tobacco Use  . Smoking status: Former Smoker    Packs/day: 0.50    Years: 4.00    Pack years: 2.00    Types: Cigarettes    Quit date: 10/29/1974    Years since quitting: 45.5  . Smokeless tobacco: Never Used  Substance and Sexual Activity  . Alcohol use: No  . Drug use: No  . Sexual activity: Not Currently  Other Topics Concern  . Not on file  Social History Narrative   Born and raised in [place], [place].  Currently reside in a private residence by himself. Daughter lives in the area. No live. Fun: hunt and fish   Denies religious beliefs that would effect health care.    Social Determinants of Health   Financial Resource Strain: Low Risk   . Difficulty of Paying Living Expenses: Not hard at all  Food Insecurity: No Food Insecurity  . Worried About Charity fundraiser in the Last Year: Never true  . Ran Out of Food in the Last Year: Never true  Transportation Needs: No Transportation Needs  . Lack of Transportation (Medical): No  . Lack of Transportation (Non-Medical): No  Physical Activity: Inactive  . Days of Exercise per Week: 0 days  . Minutes of Exercise per Session: 0 min  Stress: No Stress Concern Present  . Feeling of Stress : Not at all  Social Connections: Unknown  . Frequency of Communication with Friends and Family: More than three times a week  . Frequency of Social Gatherings with Friends and Family: More than three times a week  . Attends Religious Services: Not on file  . Active Member of Clubs or Organizations: Yes  . Attends Archivist Meetings: More than 4 times per year  . Marital Status: Widowed  Intimate Partner Violence:   .  Fear of Current or Ex-Partner:   . Emotionally Abused:   Marland Kitchen Physically Abused:   . Sexually Abused:     Current Outpatient Medications on File Prior to Visit  Medication Sig Dispense Refill  . ALPHAGAN P 0.15 % ophthalmic solution Place 1 drop into the left eye 2 (two) times daily.     . BD INSULIN SYRINGE U/F 31G X 5/16" 1 ML MISC USE 2 DAILY 180 each 4  . Cholecalciferol (VITAMIN D3) 2000 UNITS TABS Take 1 tablet by mouth daily.     Marland Kitchen Cod Liver Oil 1000 MG CAPS Take 1 capsule by mouth daily.     . COMBIGAN 0.2-0.5 % ophthalmic solution Place 1 drop into both eyes 2 (two) times daily.  12  . dorzolamide (TRUSOPT) 2 % ophthalmic solution     . enalapril (VASOTEC) 2.5 MG tablet Take 1 tablet (2.5 mg total) by mouth daily. 90  tablet 1  . Misc Natural Products (URINOZINC PO) Take 1 tablet by mouth daily.     . Multiple Vitamins-Minerals (CENTRUM ADULTS PO) Take 1 tablet by mouth daily.     . mupirocin ointment (BACTROBAN) 2 % Apply bid to affected area 30 g 1  . Omega 3 1200 MG CAPS Take 1 capsule by mouth daily.     Glory Rosebush DELICA LANCETS 99991111 MISC Use to check blood sugar 4 times per day. Dx code: E11.9 200 each 2  . ONETOUCH ULTRA test strip USE TO MONITOR GLUCOSE LEVELS 4 TIMES PER DAY E11.9 100 each 2  . PRESCRIPTION MEDICATION Gets injections in eyes at eye dr's office    . trimethoprim-polymyxin b (POLYTRIM) ophthalmic solution      No current facility-administered medications on file prior to visit.    No Known Allergies  Family History  Problem Relation Age of Onset  . Diabetes Father   . Diabetes Paternal Grandfather     BP 120/70   Pulse 75   Ht 5\' 4"  (1.626 m)   Wt 194 lb (88 kg)   SpO2 98%   BMI 33.30 kg/m    Review of Systems Denies LOC.      Objective:   Physical Exam VITAL SIGNS:  See vs page GENERAL: no distress Pulses: dorsalis pedis intact bilat.   MSK: no deformity of the feet CV: 2+ bilat leg edema.   Skin:  no ulcer on the feet.  normal color and temp on the feet.   Neuro: sensation is intact to touch on the feet.   Ext: there is bilateral onychomycosis of the toenails.   Lab Results  Component Value Date   HGBA1C 8.5 (A) 05/10/2020       Assessment & Plan:  Type 1 DM, with DR: worse Hypoglycemia, due to insulin: this limits aggressiveness of glycemic control   Patient Instructions  check your blood sugar twice a day.  vary the time of day when you check, between before the 3 meals, and at bedtime.  also check if you have symptoms of your blood sugar being too high or too low.  please keep a record of the readings and bring it to your next appointment here.  You can write it on any piece of paper.  please call us sooner if your blood sugar goes below 70, or if  you have a lot of readings over 200.  Please change the insulin to 10 units with breakfast, and 70 with lunch.  On this type of insulin schedule, you should eat  meals on a regular schedule (especially lunch).  If a meal is missed or significantly delayed, your blood sugar could go low.  Please come back for a follow-up appointment in 2 months.

## 2020-05-10 NOTE — Patient Instructions (Addendum)
check your blood sugar twice a day.  vary the time of day when you check, between before the 3 meals, and at bedtime.  also check if you have symptoms of your blood sugar being too high or too low.  please keep a record of the readings and bring it to your next appointment here.  You can write it on any piece of paper.  please call us sooner if your blood sugar goes below 70, or if you have a lot of readings over 200.  Please change the insulin to 10 units with breakfast, and 70 with lunch.  On this type of insulin schedule, you should eat meals on a regular schedule (especially lunch).  If a meal is missed or significantly delayed, your blood sugar could go low.  Please come back for a follow-up appointment in 2 months.

## 2020-06-29 ENCOUNTER — Other Ambulatory Visit: Payer: Self-pay

## 2020-06-29 ENCOUNTER — Encounter (INDEPENDENT_AMBULATORY_CARE_PROVIDER_SITE_OTHER): Payer: Medicare Other | Admitting: Ophthalmology

## 2020-06-29 DIAGNOSIS — H43813 Vitreous degeneration, bilateral: Secondary | ICD-10-CM

## 2020-06-29 DIAGNOSIS — I10 Essential (primary) hypertension: Secondary | ICD-10-CM | POA: Diagnosis not present

## 2020-06-29 DIAGNOSIS — H35033 Hypertensive retinopathy, bilateral: Secondary | ICD-10-CM

## 2020-06-29 DIAGNOSIS — E113513 Type 2 diabetes mellitus with proliferative diabetic retinopathy with macular edema, bilateral: Secondary | ICD-10-CM | POA: Diagnosis not present

## 2020-06-29 DIAGNOSIS — E11311 Type 2 diabetes mellitus with unspecified diabetic retinopathy with macular edema: Secondary | ICD-10-CM | POA: Diagnosis not present

## 2020-07-12 ENCOUNTER — Other Ambulatory Visit: Payer: Self-pay

## 2020-07-12 ENCOUNTER — Encounter: Payer: Self-pay | Admitting: Endocrinology

## 2020-07-12 ENCOUNTER — Ambulatory Visit (INDEPENDENT_AMBULATORY_CARE_PROVIDER_SITE_OTHER): Payer: Medicare Other | Admitting: Endocrinology

## 2020-07-12 VITALS — BP 138/82 | HR 67 | Wt 194.2 lb

## 2020-07-12 DIAGNOSIS — E10311 Type 1 diabetes mellitus with unspecified diabetic retinopathy with macular edema: Secondary | ICD-10-CM | POA: Diagnosis not present

## 2020-07-12 LAB — POCT GLYCOSYLATED HEMOGLOBIN (HGB A1C): Hemoglobin A1C: 8.7 % — AB (ref 4.0–5.6)

## 2020-07-12 MED ORDER — HUMULIN 70/30 (70-30) 100 UNIT/ML ~~LOC~~ SUSP: SUBCUTANEOUS | 11 refills | Status: DC

## 2020-07-12 NOTE — Progress Notes (Signed)
Subjective:    Patient ID: Andre Stout, male    DOB: 1940/10/13, 80 y.o.   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  DKA: once, in 2017.  Severe hypoglycemia: once (2015) Pancreatitis: never.   SDOH: He is not a pump candidate, due to not recording cbg's; he takes human insulin, due to cost.   Other: emphasizing basal insulin did not help; he did not tolerate pioglitizone (edema); he takes BID insulin, after poor results with multiple daily injections; he was changed to 70/30, due to pattern of cbg's.  Interval history: no cbg record, but states cbg's vary from 55-200.  It is still in general highest fasting, and lowest in the afternoon (when he eats a light lunch).  pt states he feels well in general.   Pt says he does not miss the insulin.   Past Medical History:  Diagnosis Date  . Diabetes mellitus without complication ([place])   . Glaucoma   . Prostate cancer ([place])    Been 3-4 years ago    Past Surgical History:  Procedure Laterality Date  . CATARACT EXTRACTION Bilateral     Social History   Socioeconomic History  . Marital status: Widowed    Spouse name: Not on file  . Number of children: 1  . Years of education: 48  . Highest education level: Not on file  Occupational History  . Occupation: Retired  Tobacco Use  . Smoking status: Former Smoker    Packs/day: 0.50    Years: 4.00    Pack years: 2.00    Types: Cigarettes    Quit date: 10/29/1974    Years since quitting: 45.7  . Smokeless tobacco: Never Used  Vaping Use  . Vaping Use: Never used  Substance and Sexual Activity  . Alcohol use: No  . Drug use: No  . Sexual activity: Not Currently  Other Topics Concern  . Not on file  Social History Narrative   Born and raised in [place], [place]. Currently reside in a private residence by himself. Daughter lives in the area. No live. Fun: hunt and fish   Denies  religious beliefs that would effect health care.    Social Determinants of Health   Financial Resource Strain:   . Difficulty of Paying Living Expenses:   Food Insecurity:   . Worried About Charity fundraiser in the Last Year:   . Arboriculturist in the Last Year:   Transportation Needs:   . Film/video editor (Medical):   Marland Kitchen Lack of Transportation (Non-Medical):   Physical Activity:   . Days of Exercise per Week:   . Minutes of Exercise per Session:   Stress:   . Feeling of Stress :   Social Connections:   . Frequency of Communication with Friends and Family:   . Frequency of Social Gatherings with Friends and Family:   . Attends Religious Services:   . Active Member of Clubs or Organizations:   . Attends Archivist Meetings:   Marland Kitchen Marital Status:   Intimate Partner Violence:   . Fear of Current or Ex-Partner:   . Emotionally Abused:   Marland Kitchen Physically Abused:   . Sexually Abused:     Current Outpatient Medications on File Prior to Visit  Medication Sig Dispense Refill  . ALPHAGAN P 0.15 % ophthalmic solution Place 1 drop into the left eye  2 (two) times daily.     . BD INSULIN SYRINGE U/F 31G X 5/16" 1 ML MISC USE 2 DAILY 180 each 4  . Cholecalciferol (VITAMIN D3) 2000 UNITS TABS Take 1 tablet by mouth daily.     Marland Kitchen Cod Liver Oil 1000 MG CAPS Take 1 capsule by mouth daily.     . COMBIGAN 0.2-0.5 % ophthalmic solution Place 1 drop into both eyes 2 (two) times daily.  12  . dorzolamide (TRUSOPT) 2 % ophthalmic solution     . enalapril (VASOTEC) 2.5 MG tablet Take 1 tablet (2.5 mg total) by mouth daily. 90 tablet 1  . Misc Natural Products (URINOZINC PO) Take 1 tablet by mouth daily.     . Multiple Vitamins-Minerals (CENTRUM ADULTS PO) Take 1 tablet by mouth daily.     . mupirocin ointment (BACTROBAN) 2 % Apply bid to affected area 30 g 1  . Omega 3 1200 MG CAPS Take 1 capsule by mouth daily.     Glory Rosebush DELICA LANCETS 19F MISC Use to check blood sugar 4 times per  day. Dx code: E11.9 200 each 2  . ONETOUCH ULTRA test strip USE TO MONITOR GLUCOSE LEVELS 4 TIMES PER DAY E11.9 100 each 2  . PRESCRIPTION MEDICATION Gets injections in eyes at eye dr's office    . trimethoprim-polymyxin b (POLYTRIM) ophthalmic solution      No current facility-administered medications on file prior to visit.    No Known Allergies  Family History  Problem Relation Age of Onset  . Diabetes Father   . Diabetes Paternal Grandfather     BP 138/82 (BP Location: Left Arm, Patient Position: Sitting, Cuff Size: Normal)   Pulse 67   Wt 194 lb 3.2 oz (88.1 kg)   SpO2 99%   BMI 33.33 kg/m    Review of Systems He denies hypoglycemia.      Objective:   Physical Exam VITAL SIGNS:  See vs page.   GENERAL: no distress.   Pulses: dorsalis pedis intact bilat.   MSK: no deformity of the feet.   CV: 2+ bilat leg edema.   Skin:  no ulcer on the feet.  normal color and temp on the feet.   Neuro: sensation is intact to touch on the feet.  Ext: there is bilateral onychomycosis of the toenails.    Lab Results  Component Value Date   HGBA1C 8.7 (A) 07/12/2020       Assessment & Plan:  Type 1 DM, with CRI: worse Hypoglycemia, due to insulin: this limits aggressiveness of glycemic control  Patient Instructions  check your blood sugar twice a day.  vary the time of day when you check, between before the 3 meals, and at bedtime.  also check if you have symptoms of your blood sugar being too high or too low.  please keep a record of the readings and bring it to your next appointment here.  You can write it on any piece of paper.  please call us sooner if your blood sugar goes below 70, or if you have a lot of readings over 200.  Please change the insulin to 50 units with breakfast, and 30 with supper.   On this type of insulin schedule, you should eat meals on a regular schedule (especially lunch).  If a meal is missed or significantly delayed, your blood sugar could go low.    Please come back for a follow-up appointment in 2 months.

## 2020-07-12 NOTE — Patient Instructions (Addendum)
check your blood sugar twice a day.  vary the time of day when you check, between before the 3 meals, and at bedtime.  also check if you have symptoms of your blood sugar being too high or too low.  please keep a record of the readings and bring it to your next appointment here.  You can write it on any piece of paper.  please call us sooner if your blood sugar goes below 70, or if you have a lot of readings over 200.  Please change the insulin to 50 units with breakfast, and 30 with supper.   On this type of insulin schedule, you should eat meals on a regular schedule (especially lunch).  If a meal is missed or significantly delayed, your blood sugar could go low.  Please come back for a follow-up appointment in 2 months.

## 2020-07-19 ENCOUNTER — Other Ambulatory Visit: Payer: Self-pay

## 2020-07-19 MED ORDER — "INSULIN SYRINGE-NEEDLE U-100 31G X 5/16"" 1 ML MISC": 4 refills | Status: DC

## 2020-08-24 ENCOUNTER — Other Ambulatory Visit: Payer: Self-pay

## 2020-08-24 ENCOUNTER — Encounter (INDEPENDENT_AMBULATORY_CARE_PROVIDER_SITE_OTHER): Payer: Medicare Other | Admitting: Ophthalmology

## 2020-08-24 DIAGNOSIS — H35033 Hypertensive retinopathy, bilateral: Secondary | ICD-10-CM | POA: Diagnosis not present

## 2020-08-24 DIAGNOSIS — H43813 Vitreous degeneration, bilateral: Secondary | ICD-10-CM

## 2020-08-24 DIAGNOSIS — E113513 Type 2 diabetes mellitus with proliferative diabetic retinopathy with macular edema, bilateral: Secondary | ICD-10-CM | POA: Diagnosis not present

## 2020-08-24 DIAGNOSIS — I10 Essential (primary) hypertension: Secondary | ICD-10-CM

## 2020-08-24 DIAGNOSIS — E11311 Type 2 diabetes mellitus with unspecified diabetic retinopathy with macular edema: Secondary | ICD-10-CM

## 2020-09-13 ENCOUNTER — Ambulatory Visit (INDEPENDENT_AMBULATORY_CARE_PROVIDER_SITE_OTHER): Payer: Medicare Other | Admitting: Endocrinology

## 2020-09-13 ENCOUNTER — Other Ambulatory Visit: Payer: Self-pay

## 2020-09-13 ENCOUNTER — Encounter: Payer: Self-pay | Admitting: Endocrinology

## 2020-09-13 VITALS — BP 136/70 | HR 71 | Ht 64.0 in | Wt 192.0 lb

## 2020-09-13 DIAGNOSIS — E10311 Type 1 diabetes mellitus with unspecified diabetic retinopathy with macular edema: Secondary | ICD-10-CM

## 2020-09-13 LAB — POCT GLYCOSYLATED HEMOGLOBIN (HGB A1C): Hemoglobin A1C: 7.8 % — AB (ref 4.0–5.6)

## 2020-09-13 MED ORDER — HUMULIN 70/30 (70-30) 100 UNIT/ML ~~LOC~~ SUSP: SUBCUTANEOUS | 11 refills | Status: DC

## 2020-09-13 NOTE — Patient Instructions (Addendum)
check your blood sugar twice a day.  vary the time of day when you check, between before the 3 meals, and at bedtime.  also check if you have symptoms of your blood sugar being too high or too low.  please keep a record of the readings and bring it to your next appointment here.  You can write it on any piece of paper.  please call us sooner if your blood sugar goes below 70, or if you have a lot of readings over 200.  Please change the insulin to 45 units with breakfast, and 35 with supper.   On this type of insulin schedule, you should eat meals on a regular schedule (especially lunch).  If a meal is missed or significantly delayed, your blood sugar could go low.   Please come back for a follow-up appointment in 2 months.

## 2020-09-13 NOTE — Progress Notes (Signed)
Subjective:    Patient ID: Andre Stout, male    DOB: [birth year], 80 y.o.   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  DKA: once, in 2017.  Severe hypoglycemia: once (2015) Pancreatitis: never.   SDOH: He is not a pump candidate, due to not recording cbg's; he takes human insulin, due to cost.   Other: emphasizing basal insulin did not help; he did not tolerate pioglitizone (edema); he takes BID insulin, after poor results with multiple daily injections; he was changed to 70/30, due to pattern of cbg's.  Interval history: no cbg record, but states cbg's vary from 45-300.  It is still in general highest fasting, and lowest in the afternoon.  pt states he feels well in general.   Pt says he does not miss the insulin.   Past Medical History:  Diagnosis Date  . Diabetes mellitus without complication ([place])   . Glaucoma   . Prostate cancer ([place])    Been 3-4 years ago    Past Surgical History:  Procedure Laterality Date  . CATARACT EXTRACTION Bilateral     Social History   Socioeconomic History  . Marital status: Widowed    Spouse name: Not on file  . Number of children: 1  . Years of education: 27  . Highest education level: Not on file  Occupational History  . Occupation: Retired  Tobacco Use  . Smoking status: Former Smoker    Packs/day: 0.50    Years: 4.00    Pack years: 2.00    Types: Cigarettes    Quit date: 10/29/1974    Years since quitting: 45.9  . Smokeless tobacco: Never Used  Vaping Use  . Vaping Use: Never used  Substance and Sexual Activity  . Alcohol use: No  . Drug use: No  . Sexual activity: Not Currently  Other Topics Concern  . Not on file  Social History Narrative   Born and raised in [place], [place]. Currently reside in a private residence by himself. Daughter lives in the area. No live. Fun: hunt and fish   Denies religious beliefs that would  effect health care.    Social Determinants of Health   Financial Resource Strain:   . Difficulty of Paying Living Expenses: Not on file  Food Insecurity:   . Worried About Charity fundraiser in the Last Year: Not on file  . Ran Out of Food in the Last Year: Not on file  Transportation Needs:   . Lack of Transportation (Medical): Not on file  . Lack of Transportation (Non-Medical): Not on file  Physical Activity:   . Days of Exercise per Week: Not on file  . Minutes of Exercise per Session: Not on file  Stress:   . Feeling of Stress : Not on file  Social Connections:   . Frequency of Communication with Friends and Family: Not on file  . Frequency of Social Gatherings with Friends and Family: Not on file  . Attends Religious Services: Not on file  . Active Member of Clubs or Organizations: Not on file  . Attends Archivist Meetings: Not on file  . Marital Status: Not on file  Intimate Partner Violence:   . Fear of Current or Ex-Partner: Not on file  . Emotionally Abused: Not on file  . Physically Abused: Not on file  . Sexually Abused: Not on file  Current Outpatient Medications on File Prior to Visit  Medication Sig Dispense Refill  . ALPHAGAN P 0.15 % ophthalmic solution Place 1 drop into the left eye 2 (two) times daily.     . Cholecalciferol (VITAMIN D3) 2000 UNITS TABS Take 1 tablet by mouth daily.     Marland Kitchen Cod Liver Oil 1000 MG CAPS Take 1 capsule by mouth daily.     . COMBIGAN 0.2-0.5 % ophthalmic solution Place 1 drop into both eyes 2 (two) times daily.  12  . dorzolamide (TRUSOPT) 2 % ophthalmic solution     . enalapril (VASOTEC) 2.5 MG tablet Take 1 tablet (2.5 mg total) by mouth daily. 90 tablet 1  . Insulin Syringe-Needle U-100 (BD INSULIN SYRINGE U/F) 31G X 5/16" 1 ML MISC USE 2 DAILY 200 each 4  . Misc Natural Products (URINOZINC PO) Take 1 tablet by mouth daily.     . Multiple Vitamins-Minerals (CENTRUM ADULTS PO) Take 1 tablet by mouth daily.     .  mupirocin ointment (BACTROBAN) 2 % Apply bid to affected area 30 g 1  . Omega 3 1200 MG CAPS Take 1 capsule by mouth daily.     Glory Rosebush DELICA LANCETS 56L MISC Use to check blood sugar 4 times per day. Dx code: E11.9 200 each 2  . ONETOUCH ULTRA test strip USE TO MONITOR GLUCOSE LEVELS 4 TIMES PER DAY E11.9 100 each 2  . PRESCRIPTION MEDICATION Gets injections in eyes at eye dr's office    . trimethoprim-polymyxin b (POLYTRIM) ophthalmic solution      No current facility-administered medications on file prior to visit.    No Known Allergies  Family History  Problem Relation Age of Onset  . Diabetes Father   . Diabetes Paternal Grandfather     BP 136/70   Pulse 71   Ht 5\' 4"  (1.626 m)   Wt 192 lb (87.1 kg)   SpO2 97%   BMI 32.96 kg/m    Review of Systems Denies LOC    Objective:   Physical Exam VITAL SIGNS:  See vs page GENERAL: no distress Pulses: dorsalis pedis intact bilat.   MSK: no deformity of the feet.   CV: 2+ bilat leg edema.   Skin:  no ulcer on the feet.  normal color and temp on the feet.   Neuro: sensation is intact to touch on the feet.  Ext: there is bilateral onychomycosis of the toenails.    Lab Results  Component Value Date   HGBA1C 7.8 (A) 09/13/2020   Lab Results  Component Value Date   CREATININE 1.35 04/28/2019   BUN 21 04/28/2019   NA 131 (L) 04/28/2019   K 4.9 04/28/2019   CL 94 (L) 04/28/2019   CO2 25 04/28/2019      Assessment & Plan:  Type 1 DM, with CRI: The pattern of his cbg's indicates he needs some adjustment in his therapy Hypoglycemia, due to insulin: this limits aggressiveness of glycemic control.   Patient Instructions  check your blood sugar twice a day.  vary the time of day when you check, between before the 3 meals, and at bedtime.  also check if you have symptoms of your blood sugar being too high or too low.  please keep a record of the readings and bring it to your next appointment here.  You can write it on any  piece of paper.  please call us sooner if your blood sugar goes below 70, or if you have  a lot of readings over 200.  Please change the insulin to 45 units with breakfast, and 35 with supper.   On this type of insulin schedule, you should eat meals on a regular schedule (especially lunch).  If a meal is missed or significantly delayed, your blood sugar could go low.   Please come back for a follow-up appointment in 2 months.

## 2020-10-09 ENCOUNTER — Emergency Department (HOSPITAL_COMMUNITY): Payer: Medicare Other

## 2020-10-09 ENCOUNTER — Encounter (HOSPITAL_COMMUNITY): Payer: Self-pay | Admitting: Emergency Medicine

## 2020-10-09 ENCOUNTER — Observation Stay (HOSPITAL_COMMUNITY)
Admission: EM | Admit: 2020-10-09 | Discharge: 2020-10-10 | Disposition: A | Discharge status: Home / Self Care | Payer: Medicare Other | Attending: Internal Medicine | Admitting: Internal Medicine

## 2020-10-09 ENCOUNTER — Other Ambulatory Visit: Payer: Self-pay

## 2020-10-09 DIAGNOSIS — Z794 Long term (current) use of insulin: Secondary | ICD-10-CM | POA: Diagnosis not present

## 2020-10-09 DIAGNOSIS — W19XXXA Unspecified fall, initial encounter: Secondary | ICD-10-CM

## 2020-10-09 DIAGNOSIS — W1839XA Other fall on same level, initial encounter: Secondary | ICD-10-CM | POA: Insufficient documentation

## 2020-10-09 DIAGNOSIS — Z20822 Contact with and (suspected) exposure to covid-19: Secondary | ICD-10-CM | POA: Diagnosis not present

## 2020-10-09 DIAGNOSIS — M25561 Pain in right knee: Secondary | ICD-10-CM | POA: Diagnosis not present

## 2020-10-09 DIAGNOSIS — S8391XA Sprain of unspecified site of right knee, initial encounter: Secondary | ICD-10-CM

## 2020-10-09 DIAGNOSIS — I10 Essential (primary) hypertension: Secondary | ICD-10-CM | POA: Diagnosis not present

## 2020-10-09 DIAGNOSIS — Y92009 Unspecified place in unspecified non-institutional (private) residence as the place of occurrence of the external cause: Secondary | ICD-10-CM | POA: Insufficient documentation

## 2020-10-09 DIAGNOSIS — Z79899 Other long term (current) drug therapy: Secondary | ICD-10-CM | POA: Insufficient documentation

## 2020-10-09 DIAGNOSIS — E119 Type 2 diabetes mellitus without complications: Secondary | ICD-10-CM | POA: Insufficient documentation

## 2020-10-09 DIAGNOSIS — S39012A Strain of muscle, fascia and tendon of lower back, initial encounter: Secondary | ICD-10-CM

## 2020-10-09 DIAGNOSIS — R55 Syncope and collapse: Secondary | ICD-10-CM | POA: Diagnosis not present

## 2020-10-09 DIAGNOSIS — Z87891 Personal history of nicotine dependence: Secondary | ICD-10-CM | POA: Diagnosis not present

## 2020-10-09 DIAGNOSIS — C61 Malignant neoplasm of prostate: Secondary | ICD-10-CM | POA: Diagnosis present

## 2020-10-09 DIAGNOSIS — M545 Low back pain, unspecified: Secondary | ICD-10-CM | POA: Diagnosis not present

## 2020-10-09 DIAGNOSIS — Z8546 Personal history of malignant neoplasm of prostate: Secondary | ICD-10-CM | POA: Diagnosis not present

## 2020-10-09 LAB — BASIC METABOLIC PANEL
Anion gap: 14 (ref 5–15)
BUN: 12 mg/dL (ref 8–23)
CO2: 23 mmol/L (ref 22–32)
Calcium: 9.8 mg/dL (ref 8.9–10.3)
Chloride: 103 mmol/L (ref 98–111)
Creatinine, Ser: 1.23 mg/dL (ref 0.61–1.24)
GFR, Estimated: 55 mL/min — ABNORMAL LOW (ref >60)
Glucose, Bld: 218 mg/dL — ABNORMAL HIGH (ref 70–99)
Potassium: 3.9 mmol/L (ref 3.5–5.1)
Sodium: 140 mmol/L (ref 135–145)

## 2020-10-09 LAB — URINALYSIS, ROUTINE W REFLEX MICROSCOPIC
Bacteria, UA: NONE SEEN
Bilirubin Urine: NEGATIVE
Glucose, UA: 150 mg/dL — AB
Hgb urine dipstick: NEGATIVE
Ketones, ur: 5 mg/dL — AB
Leukocytes,Ua: NEGATIVE
Nitrite: NEGATIVE
Protein, ur: 30 mg/dL — AB
Specific Gravity, Urine: 1.02 (ref 1.005–1.030)
pH: 6 (ref 5.0–8.0)

## 2020-10-09 LAB — CBC
HCT: 46.4 % (ref 39.0–52.0)
Hemoglobin: 15.3 g/dL (ref 13.0–17.0)
MCH: 28.4 pg (ref 26.0–34.0)
MCHC: 33 g/dL (ref 30.0–36.0)
MCV: 86.1 fL (ref 80.0–100.0)
Platelets: 241 10*3/uL (ref 150–400)
RBC: 5.39 MIL/uL (ref 4.22–5.81)
RDW: 13.8 % (ref 11.5–15.5)
WBC: 15.6 10*3/uL — ABNORMAL HIGH (ref 4.0–10.5)
nRBC: 0 % (ref 0.0–0.2)

## 2020-10-09 LAB — CBG MONITORING, ED: Glucose-Capillary: 155 mg/dL — ABNORMAL HIGH (ref 70–99)

## 2020-10-09 LAB — RESPIRATORY PANEL BY RT PCR (FLU A&B, COVID)
Influenza A by PCR: NEGATIVE
Influenza B by PCR: NEGATIVE
SARS Coronavirus 2 by RT PCR: NEGATIVE

## 2020-10-09 LAB — GLUCOSE, CAPILLARY
Glucose-Capillary: 59 mg/dL — ABNORMAL LOW (ref 70–99)
Glucose-Capillary: 101 mg/dL — ABNORMAL HIGH (ref 70–99)
Glucose-Capillary: 69 mg/dL — ABNORMAL LOW (ref 70–99)

## 2020-10-09 MED ORDER — INSULIN ASPART 100 UNIT/ML ~~LOC~~ SOLN
0.0000 [IU] | Freq: Three times a day (TID) | SUBCUTANEOUS | Status: DC
  Administered 2020-10-10: 5 [IU] via SUBCUTANEOUS
  Administered 2020-10-10: 3 [IU] via SUBCUTANEOUS
  Filled 2020-10-09: qty 0.09

## 2020-10-09 MED ORDER — INSULIN ASPART 100 UNIT/ML ~~LOC~~ SOLN
0.0000 [IU] | Freq: Every day | SUBCUTANEOUS | Status: DC
  Filled 2020-10-09: qty 0.05

## 2020-10-09 MED ORDER — SODIUM CHLORIDE 0.9% FLUSH
3.0000 mL | Freq: Two times a day (BID) | INTRAVENOUS | Status: DC
  Administered 2020-10-09: 3 mL via INTRAVENOUS

## 2020-10-09 MED ORDER — SODIUM CHLORIDE 0.9 % IV SOLN
1000.0000 mL | INTRAVENOUS | Status: DC
  Administered 2020-10-09: 1000 mL via INTRAVENOUS

## 2020-10-09 MED ORDER — ENOXAPARIN SODIUM 40 MG/0.4ML ~~LOC~~ SOLN
40.0000 mg | SUBCUTANEOUS | Status: DC
  Administered 2020-10-09: 40 mg via SUBCUTANEOUS
  Filled 2020-10-09: qty 0.4

## 2020-10-09 MED ORDER — SODIUM CHLORIDE 0.9 % IV BOLUS (SEPSIS)
500.0000 mL | Freq: Once | INTRAVENOUS | Status: AC
  Administered 2020-10-09: 500 mL via INTRAVENOUS

## 2020-10-09 MED ORDER — ONDANSETRON HCL 4 MG/2ML IJ SOLN
4.0000 mg | Freq: Once | INTRAMUSCULAR | Status: AC
  Administered 2020-10-09: 4 mg via INTRAVENOUS
  Filled 2020-10-09: qty 2

## 2020-10-09 MED ORDER — SODIUM CHLORIDE 0.9 % IV SOLN: INTRAVENOUS | Status: DC

## 2020-10-09 MED ORDER — MORPHINE SULFATE (PF) 4 MG/ML IV SOLN
4.0000 mg | Freq: Once | INTRAVENOUS | Status: AC
  Administered 2020-10-09: 4 mg via INTRAVENOUS
  Filled 2020-10-09: qty 1

## 2020-10-09 NOTE — ED Provider Notes (Signed)
[facility] DEPT Provider Note   CSN: [id number] Arrival date & time: 10/09/20  1306     History Chief Complaint  Patient presents with  . Loss of Consciousness    Andre Stout is a 80 y.o. male.  HPI   Patient presents to the ED for evaluation after a syncopal episode.  Patient states he remembers having a conversation with the family member this morning.  That was the last thing that he remembered.  Family members went to the house later this morning and the patient was not answering the door.  When they went to check on him inside the house they found him lying on the floor.  Patient was complaining of pain in his back and his knee.  Patient does not member any particular prodrome before he fell.  He did take his insulin and wonders if his blood sugar was low.  However, family states when they checked his blood sugar was in the 200s.  Patient denies any focal weakness.  He denies any trouble with his speech.  He denies any headache.  No fevers or chills.  Past Medical History:  Diagnosis Date  . Diabetes mellitus without complication ([place])   . Glaucoma   . Prostate cancer ([place])    Been 3-4 years ago    Patient Active Problem List   Diagnosis Date Noted  . Prostate cancer ([place]) 03/25/2019  . Diabetes ([place]) 03/28/2016  . Hypophosphatemia 03/06/2016  . Hypomagnesemia 03/06/2016  . Influenza A 03/05/2016  . Benign essential HTN 03/05/2016  . AKI (acute kidney injury) ([place]) 03/04/2016  . Routine general medical examination at a health care facility 05/02/2015  . Medicare annual wellness visit, subsequent 05/02/2015    Past Surgical History:  Procedure Laterality Date  . CATARACT EXTRACTION Bilateral        Family History  Problem Relation Age of Onset  . Diabetes Father   . Diabetes Paternal Grandfather     Social History   Tobacco Use  . Smoking status: Former Smoker    Packs/day: 0.50    Years: 4.00    Pack years: 2.00     Types: Cigarettes    Quit date: 10/29/1974    Years since quitting: 45.9  . Smokeless tobacco: Never Used  Vaping Use  . Vaping Use: Never used  Substance Use Topics  . Alcohol use: No  . Drug use: No    Home Medications Prior to Admission medications   Medication Sig Start Date End Date Taking? Authorizing Provider  ALPHAGAN P 0.15 % ophthalmic solution Place 1 drop into the left eye 2 (two) times daily.  02/11/16   [provider]  Cholecalciferol (VITAMIN D3) 2000 UNITS TABS Take 1 tablet by mouth daily.     [provider]  Cod Liver Oil 1000 MG CAPS Take 1 capsule by mouth daily.     [provider]  COMBIGAN 0.2-0.5 % ophthalmic solution Place 1 drop into both eyes 2 (two) times daily. 12/19/15   [provider]  dorzolamide (TRUSOPT) 2 % ophthalmic solution  04/06/19   [provider]  enalapril (VASOTEC) 2.5 MG tablet Take 1 tablet (2.5 mg total) by mouth daily. 03/25/19   [name], FNP  insulin NPH-regular Human (HUMULIN 70/30) (70-30) 100 UNIT/ML injection 45 units with breakfast, and 35 with supper 09/13/20   Renato Shin, MD  Insulin Syringe-Needle U-100 (BD INSULIN SYRINGE U/F) 31G X 5/16" 1 ML MISC USE 2 DAILY 07/19/20  Renato Shin, MD  Misc Natural Products (URINOZINC PO) Take 1 tablet by mouth daily.     [provider]  Multiple Vitamins-Minerals (CENTRUM ADULTS PO) Take 1 tablet by mouth daily.     [provider]  mupirocin ointment (BACTROBAN) 2 % Apply bid to affected area 04/14/19   [name], FNP  Omega 3 1200 MG CAPS Take 1 capsule by mouth daily.     [provider]  [facility] DELICA LANCETS 56D MISC Use to check blood sugar 4 times per day. Dx code: E11.9 03/27/16   Renato Shin, MD  [facility] ULTRA test strip USE TO MONITOR GLUCOSE LEVELS 4 TIMES PER DAY E11.9 06/10/19   Renato Shin, MD  PRESCRIPTION MEDICATION Gets injections in eyes at eye dr's office    [provider]  trimethoprim-polymyxin b (POLYTRIM) ophthalmic solution  04/20/19   [provider]    Allergies    Patient has no known allergies.  Review of Systems   Review of Systems  All other systems reviewed and are negative.   Physical Exam Updated Vital Signs BP 136/66   Pulse 73   Temp 97.9 F (36.6 C) (Oral)   Resp 16   Ht 1.651 m (5\' 5" )   Wt 86.2 kg   SpO2 97%   BMI 31.62 kg/m   Physical Exam Vitals and nursing note reviewed.  Constitutional:      General: He is not in acute distress.    Appearance: He is well-developed.  HENT:     Head: Normocephalic and atraumatic.     Right Ear: External ear normal.     Left Ear: External ear normal.  Eyes:     General: No scleral icterus.       Right eye: No discharge.        Left eye: No discharge.     Conjunctiva/sclera: Conjunctivae normal.  Neck:     Trachea: No tracheal deviation.  Cardiovascular:     Rate and Rhythm: Normal rate and regular rhythm.  Pulmonary:     Effort: Pulmonary effort is normal. No respiratory distress.     Breath sounds: Normal breath sounds. No stridor. No wheezing or rales.  Abdominal:     General: Bowel sounds are normal. There is no distension.     Palpations: Abdomen is soft.     Tenderness: There is no abdominal tenderness. There is no guarding or rebound.  Musculoskeletal:        General: Tenderness present.     Cervical back: Neck supple.     Comments: Tenderness palpation right knee, no pain with passive range of motion of the right hip when the patient's knee is supported, patient is unable to lift his leg off the bed due to pain but he indicates that the pain is in his knee, tenderness to palpation around the right knee, no bruising or effusion  Skin:    General: Skin is warm and dry.     Findings: No rash.     Comments: Superficial injury abrasions noted on the back  Neurological:     Mental Status: He is alert.     Cranial Nerves: No cranial nerve deficit (no  facial droop, extraocular movements intact, no slurred speech).     Sensory: No sensory deficit.     Motor: No abnormal muscle tone or seizure activity.     Coordination: Coordination normal.     Comments: No facial droop, normal speech, normal strength and sensation bilateral  upper extremities and lower extremities     ED Results / Procedures / Treatments   Labs (all labs ordered are listed, but only abnormal results are displayed) Labs Reviewed  BASIC METABOLIC PANEL - Abnormal; Notable for the following components:      Result Value   Glucose, Bld 218 (*)    GFR, Estimated 55 (*)    All other components within normal limits  CBC - Abnormal; Notable for the following components:   WBC 15.6 (*)    All other components within normal limits  CBG MONITORING, ED - Abnormal; Notable for the following components:   Glucose-Capillary 155 (*)    All other components within normal limits  RESPIRATORY PANEL BY RT PCR (FLU A&B, COVID)  URINALYSIS, ROUTINE W REFLEX MICROSCOPIC  CBG MONITORING, ED    EKG EKG Interpretation  Date/Time:  Sunday October 09 2020 13:21:04 EDT Ventricular Rate:  89 PR Interval:    QRS Duration: 83 QT Interval:  370 QTC Calculation: 451 R Axis:   -55 Text Interpretation: Sinus or ectopic atrial rhythm Prolonged PR interval LAD, consider left anterior fascicular block Anterior infarct, old Nonspecific repol abnormality, lateral leads No significant change since last tracing Confirmed by Dorie Rank [phone]) on 10/09/2020 2:51:32 PM   Radiology DG Chest 1 View  Result Date: 10/09/2020 CLINICAL DATA:  Possible loss of consciousness. EXAM: CHEST  1 VIEW COMPARISON:  March 04, 2016 FINDINGS: The heart size and mediastinal contours are within normal limits. Chronic elevation of right hemidiaphragm is unchanged. Both lungs are clear. The visualized skeletal structures are unremarkable. IMPRESSION: No active disease. Electronically Signed   By: Abelardo Diesel M.D.    On: 10/09/2020 16:23   DG Lumbar Spine Complete  Result Date: 10/09/2020 CLINICAL DATA:  Low back and right hip pain.  Found down. EXAM: LUMBAR SPINE - COMPLETE 4+ VIEW COMPARISON:  Chest radiographs 03/04/2016. FINDINGS: There are 5 lumbar type vertebral bodies. The bones are diffusely demineralized. The alignment is normal. Minimal anterior wedging of the L1 vertebral body appears grossly unchanged. No evidence of acute fracture or traumatic subluxation. The disc spaces are relatively preserved. There is mild intervertebral spurring and facet hypertrophy. Scattered vascular calcifications are noted. IMPRESSION: No evidence of acute lumbar spine fracture, traumatic subluxation or significant spondylosis. Stable minimal L1 compression deformity. Diffuse osseous demineralization. Electronically Signed   By: Richardean Sale M.D.   On: 10/09/2020 16:21   CT Head Wo Contrast  Result Date: 10/09/2020 CLINICAL DATA:  Found down EXAM: CT HEAD WITHOUT CONTRAST TECHNIQUE: Contiguous axial images were obtained from the base of the skull through the vertex without intravenous contrast. COMPARISON:  None. FINDINGS: Brain: No evidence of acute infarction, hemorrhage, hydrocephalus, extra-axial collection or mass lesion/mass effect. Periventricular white matter hypodensities consistent with sequela of chronic microvascular ischemic disease. Global parenchymal volume loss. Vascular: No hyperdense vessel or unexpected calcification. Skull: Normal. Negative for fracture or focal lesion. Sinuses/Orbits: No acute finding. Other: None. IMPRESSION: No acute intracranial abnormality. Electronically Signed   By: Valentino Saxon MD   On: 10/09/2020 16:34   DG Knee Complete 4 Views Right  Result Date: 10/09/2020 CLINICAL DATA:  Status post fall with right knee pain. EXAM: RIGHT KNEE - COMPLETE 4+ VIEW COMPARISON:  June 18, 2011 FINDINGS: No evidence of fracture, dislocation, or joint effusion. Marked narrowing medial  femoral tibial joint space is noted. Soft tissues are unremarkable. IMPRESSION: No acute fracture or dislocation. Osteoarthritic changes of right knee. Electronically Signed   By:  Abelardo Diesel M.D.   On: 10/09/2020 14:53   DG Hip Unilat W or Wo Pelvis 2-3 Views Right  Result Date: 10/09/2020 CLINICAL DATA:  Low back and right hip pain.  Found down. EXAM: DG HIP (WITH OR WITHOUT PELVIS) 2-3V RIGHT COMPARISON:  None. FINDINGS: The bones appear demineralized. There is no evidence of acute fracture, dislocation or femoral head avascular necrosis. There is no evidence of osseous metastatic disease. Mild degenerative changes are present at both hips. Prostate brachytherapy seeds are noted. IMPRESSION: No evidence of acute fracture, dislocation or osseous metastatic disease. Electronically Signed   By: Richardean Sale M.D.   On: 10/09/2020 16:19    Procedures Procedures (including critical care time)  Medications Ordered in ED Medications  sodium chloride 0.9 % bolus 500 mL (500 mLs Intravenous New Bag/Given 10/09/20 1646)    Followed by  0.9 %  sodium chloride infusion (has no administration in time range)  morphine 4 MG/ML injection 4 mg (4 mg Intravenous Given 10/09/20 1638)  ondansetron (ZOFRAN) injection 4 mg (4 mg Intravenous Given 10/09/20 1637)    ED Course  I have reviewed the triage vital signs and the nursing notes.  Pertinent labs & imaging results that were available during my care of the patient were reviewed by me and considered in my medical decision making (see chart for details).    MDM Rules/Calculators/A&P                          Patient presented to ED for evaluation after a possible syncopal episode.  Patient does not exactly remember having any prodromal symptoms.  Unclear if this is syncope.  The patient might of had a seizure.  Neuro exam is normal at this time.  No findings to suggest stroke.  Patient does not have any signs of infection although he does have an  elevated white blood cell count.  We will check a urinalysis.  His Covid test is negative.  Head CT does not show any acute findings.  X-rays do not show any signs of fracture from his fall.  However, considering his age and his presentation I do think patient warrants observation admission and cardiac monitoring.  Will consult medical service for admission. Final Clinical Impression(s) / ED Diagnoses Final diagnoses:  Syncope and collapse  Sprain of right knee, unspecified ligament, initial encounter  Back strain, initial encounter     Dorie Rank, MD 10/09/20 [phone]

## 2020-10-09 NOTE — ED Triage Notes (Signed)
Patient BIB family. Family reports found patient down this morning after he was not answering the door. Patient was talking on phone with family this morning but has no recollection of phone call or fall. Back pain and knee pain.

## 2020-10-09 NOTE — ED Notes (Signed)
Patient transported to CT and now returning.

## 2020-10-09 NOTE — H&P (Signed)
History and Physical   Andre Stout GXQ:[id number] DOB: 1940-01-02 DOA: 10/09/2020  Referring MD/NP/PA: Dr. Dorie Rank  PCP: [name], [place]   Outpatient Specialists: None  Patient coming from: Home  Chief Complaint: Passing out  HPI: Andre Stout is a 80 y.o. male with medical history significant of diabetes, prostate cancer, history of glaucoma, who was brought in by family after finding him on the floor.  They apparently talked to him early in the morning but later on patient could not remember anything beyond that conversation.  They went to the house and was not answering the door.  When they made it into the house he was on the floor apparently looks like he fell complaining of pain in the back of his knee.  Patient also was noted to have weakness.  Initially thought that patient may have had hypoglycemia but blood sugar was more than 200 as checked by family.  Patient is now back to baseline seems to have recovered.  Denied any dizziness denied any chest pain.  Denied any focal weakness.  Is presumed the patient may have had either a syncopal episode or seizure episode.  He is being admitted to the hospital for further evaluation and treatment.  ED Course: Temperature 98 blood pressure 147/70 pulse is 90 respirate of 23 oxygen sat 97% on room air.  White count 15.6 otherwise CBC within normal glucose is 218.  Chemistry largely within normal otherwise.  Hemoglobin A1c is 7.8 recently.  Head CT without contrast is negative.  EKG showed no acute findings.  Patient will be admitted for observation possible syncopal episode versus seizure.  Review of Systems: As per HPI otherwise 10 point review of systems negative.    Past Medical History:  Diagnosis Date  . Diabetes mellitus without complication ([place])   . Glaucoma   . Prostate cancer ([place])    Been 3-4 years ago    Past Surgical History:  Procedure Laterality Date  . CATARACT EXTRACTION Bilateral      reports that  he quit smoking about 45 years ago. His smoking use included cigarettes. He has a 2.00 pack-year smoking history. He has never used smokeless tobacco. He reports that he does not drink alcohol and does not use drugs.  No Known Allergies  Family History  Problem Relation Age of Onset  . Diabetes Father   . Diabetes Paternal Grandfather      Prior to Admission medications   Medication Sig Start Date End Date Taking? Authorizing Provider  ALPHAGAN P 0.15 % ophthalmic solution Place 1 drop into the left eye 2 (two) times daily.  02/11/16   [provider]  Cholecalciferol (VITAMIN D3) 2000 UNITS TABS Take 1 tablet by mouth daily.     [provider]  Cod Liver Oil 1000 MG CAPS Take 1 capsule by mouth daily.     [provider]  COMBIGAN 0.2-0.5 % ophthalmic solution Place 1 drop into both eyes 2 (two) times daily. 12/19/15   [provider]  dorzolamide (TRUSOPT) 2 % ophthalmic solution  04/06/19   [provider]  enalapril (VASOTEC) 2.5 MG tablet Take 1 tablet (2.5 mg total) by mouth daily. 03/25/19   [name], FNP  insulin NPH-regular Human (HUMULIN 70/30) (70-30) 100 UNIT/ML injection 45 units with breakfast, and 35 with supper 09/13/20   Renato Shin, MD  Insulin Syringe-Needle U-100 (BD INSULIN SYRINGE U/F) 31G X 5/16" 1 ML MISC USE 2 DAILY 07/19/20   Loanne Drilling,  Hilliard Clark, MD  Misc Natural Products (URINOZINC PO) Take 1 tablet by mouth daily.     [provider]  Multiple Vitamins-Minerals (CENTRUM ADULTS PO) Take 1 tablet by mouth daily.     [provider]  mupirocin ointment (BACTROBAN) 2 % Apply bid to affected area 04/14/19   [name], FNP  Omega 3 1200 MG CAPS Take 1 capsule by mouth daily.     [provider]  [facility] DELICA LANCETS 84X MISC Use to check blood sugar 4 times per day. Dx code: E11.9 03/27/16   Renato Shin, MD  [facility] ULTRA test strip USE TO MONITOR GLUCOSE LEVELS 4 TIMES PER  DAY E11.9 06/10/19   Renato Shin, MD  PRESCRIPTION MEDICATION Gets injections in eyes at eye dr's office    [provider]  trimethoprim-polymyxin b (POLYTRIM) ophthalmic solution  04/20/19   [provider]    Physical Exam: Vitals:   10/09/20 2100 10/09/20 2200 10/09/20 2205 10/09/20 2210  BP: (!) 137/95 138/74 138/75 133/71  Pulse: 83 79 81 87  Resp: (!) 23 20 20 20   Temp:  98 F (36.7 C)    TempSrc:  Oral    SpO2: 99% 100% 100% 100%  Weight:  87.9 kg    Height:  5\' 4"  (1.626 m)        Constitutional: NAD, calm, comfortable Vitals:   10/09/20 2100 10/09/20 2200 10/09/20 2205 10/09/20 2210  BP: (!) 137/95 138/74 138/75 133/71  Pulse: 83 79 81 87  Resp: (!) 23 20 20 20   Temp:  98 F (36.7 C)    TempSrc:  Oral    SpO2: 99% 100% 100% 100%  Weight:  87.9 kg    Height:  5\' 4"  (1.626 m)     Eyes: PERRL, lids and conjunctivae normal ENMT: Mucous membranes are moist. Posterior pharynx clear of any exudate or lesions.Normal dentition.  Neck: normal, supple, no masses, no thyromegaly Respiratory: clear to auscultation bilaterally, no wheezing, no crackles. Normal respiratory effort. No accessory muscle use.  Cardiovascular: Regular rate and rhythm, no murmurs / rubs / gallops. No extremity edema. 2+ pedal pulses. No carotid bruits.  Abdomen: no tenderness, no masses palpated. No hepatosplenomegaly. Bowel sounds positive.  Musculoskeletal: no clubbing / cyanosis. No joint deformity upper and lower extremities. Good ROM, no contractures. Normal muscle tone.  Skin: no rashes, lesions, ulcers. No induration Neurologic: CN 2-12 grossly intact. Sensation intact, DTR normal. Strength 5/5 in all 4.  Psychiatric: Normal judgment and insight. Alert and oriented x 3. Normal mood.     Labs on Admission: I have personally reviewed following labs and imaging studies  CBC: Recent Labs  Lab 10/09/20 1417  WBC 15.6*  HGB 15.3  HCT 46.4  MCV 86.1  PLT 324   Basic  Metabolic Panel: Recent Labs  Lab 10/09/20 1417  NA 140  K 3.9  CL 103  CO2 23  GLUCOSE 218*  BUN 12  CREATININE 1.23  CALCIUM 9.8   GFR: Estimated Creatinine Clearance: 48.7 mL/min (by C-G formula based on SCr of 1.23 mg/dL). Liver Function Tests: No results for input(s): AST, ALT, ALKPHOS, BILITOT, PROT, ALBUMIN in the last 168 hours. No results for input(s): LIPASE, AMYLASE in the last 168 hours. No results for input(s): AMMONIA in the last 168 hours. Coagulation Profile: No results for input(s): INR, PROTIME in the last 168 hours. Cardiac Enzymes: No results for input(s): CKTOTAL, CKMB, CKMBINDEX, TROPONINI in the last 168 hours. BNP (last 3 results)  No results for input(s): PROBNP in the last 8760 hours. HbA1C: No results for input(s): HGBA1C in the last 72 hours. CBG: Recent Labs  Lab 10/09/20 1315 10/09/20 2212 10/09/20 2232  GLUCAP 155* 59* 69*   Lipid Profile: No results for input(s): CHOL, HDL, LDLCALC, TRIG, CHOLHDL, LDLDIRECT in the last 72 hours. Thyroid Function Tests: No results for input(s): TSH, T4TOTAL, FREET4, T3FREE, THYROIDAB in the last 72 hours. Anemia Panel: No results for input(s): VITAMINB12, FOLATE, FERRITIN, TIBC, IRON, RETICCTPCT in the last 72 hours. Urine analysis:    Component Value Date/Time   COLORURINE YELLOW 10/09/2020 [place] 10/09/2020 1327   LABSPEC 1.020 10/09/2020 1327   PHURINE 6.0 10/09/2020 1327   GLUCOSEU 150 (A) 10/09/2020 1327   HGBUR NEGATIVE 10/09/2020 1327   BILIRUBINUR NEGATIVE 10/09/2020 1327   KETONESUR 5 (A) 10/09/2020 1327   PROTEINUR 30 (A) 10/09/2020 1327   UROBILINOGEN 1.0 10/04/2009 1109   NITRITE NEGATIVE 10/09/2020 1327   LEUKOCYTESUR NEGATIVE 10/09/2020 1327   Sepsis Labs: @LABRCNTIP (procalcitonin:4,lacticidven:4) ) Recent Results (from the past 240 hour(s))  Respiratory Panel by RT PCR (Flu A&B, Covid) - Nasopharyngeal Swab     Status: None   Collection Time: 10/09/20  3:31  PM   Specimen: Nasopharyngeal Swab  Result Value Ref Range Status   SARS Coronavirus 2 by RT PCR NEGATIVE NEGATIVE Final    Comment: (NOTE) SARS-CoV-2 target nucleic acids are NOT DETECTED.  The SARS-CoV-2 RNA is generally detectable in upper respiratoy specimens during the acute phase of infection. The lowest concentration of SARS-CoV-2 viral copies this assay can detect is 131 copies/mL. A negative result does not preclude SARS-Cov-2 infection and should not be used as the sole basis for treatment or other patient management decisions. A negative result may occur with  improper specimen collection/handling, submission of specimen other than nasopharyngeal swab, presence of viral mutation(s) within the areas targeted by this assay, and inadequate number of viral copies (<131 copies/mL). A negative result must be combined with clinical observations, patient history, and epidemiological information. The expected result is Negative.  Fact Sheet for Patients:  PinkCheek.be  Fact Sheet for Healthcare Providers:  GravelBags.it  This test is no t yet approved or cleared by the [place] FDA and  has been authorized for detection and/or diagnosis of SARS-CoV-2 by FDA under an Emergency Use Authorization (EUA). This EUA will remain  in effect (meaning this test can be used) for the duration of the COVID-19 declaration under Section 564(b)(1) of the Act, 21 U.S.C. section 360bbb-3(b)(1), unless the authorization is terminated or revoked sooner.     Influenza A by PCR NEGATIVE NEGATIVE Final   Influenza B by PCR NEGATIVE NEGATIVE Final    Comment: (NOTE) The Xpert Xpress SARS-CoV-2/FLU/RSV assay is intended as an aid in  the diagnosis of influenza from Nasopharyngeal swab specimens and  should not be used as a sole basis for treatment. Nasal washings and  aspirates are unacceptable for Xpert Xpress SARS-CoV-2/FLU/RSV    testing.  Fact Sheet for Patients: PinkCheek.be  Fact Sheet for Healthcare Providers: GravelBags.it  This test is not yet approved or cleared by the [place] FDA and  has been authorized for detection and/or diagnosis of SARS-CoV-2 by  FDA under an Emergency Use Authorization (EUA). This EUA will remain  in effect (meaning this test can be used) for the duration of the  Covid-19 declaration under Section 564(b)(1) of the Act, 21  U.S.C. section 360bbb-3(b)(1), unless the authorization is  terminated or revoked. Performed at [facility], [place] [address], [place], [place] [address]      Radiological Exams on Admission: DG Chest 1 View  Result Date: 10/09/2020 CLINICAL DATA:  Possible loss of consciousness. EXAM: CHEST  1 VIEW COMPARISON:  March 04, 2016 FINDINGS: The heart size and mediastinal contours are within normal limits. Chronic elevation of right hemidiaphragm is unchanged. Both lungs are clear. The visualized skeletal structures are unremarkable. IMPRESSION: No active disease. Electronically Signed   By: Abelardo Diesel M.D.   On: 10/09/2020 16:23   DG Lumbar Spine Complete  Result Date: 10/09/2020 CLINICAL DATA:  Low back and right hip pain.  Found down. EXAM: LUMBAR SPINE - COMPLETE 4+ VIEW COMPARISON:  Chest radiographs 03/04/2016. FINDINGS: There are 5 lumbar type vertebral bodies. The bones are diffusely demineralized. The alignment is normal. Minimal anterior wedging of the L1 vertebral body appears grossly unchanged. No evidence of acute fracture or traumatic subluxation. The disc spaces are relatively preserved. There is mild intervertebral spurring and facet hypertrophy. Scattered vascular calcifications are noted. IMPRESSION: No evidence of acute lumbar spine fracture, traumatic subluxation or significant spondylosis. Stable minimal L1 compression deformity. Diffuse osseous demineralization.  Electronically Signed   By: Richardean Sale M.D.   On: 10/09/2020 16:21   CT Head Wo Contrast  Result Date: 10/09/2020 CLINICAL DATA:  Found down EXAM: CT HEAD WITHOUT CONTRAST TECHNIQUE: Contiguous axial images were obtained from the base of the skull through the vertex without intravenous contrast. COMPARISON:  None. FINDINGS: Brain: No evidence of acute infarction, hemorrhage, hydrocephalus, extra-axial collection or mass lesion/mass effect. Periventricular white matter hypodensities consistent with sequela of chronic microvascular ischemic disease. Global parenchymal volume loss. Vascular: No hyperdense vessel or unexpected calcification. Skull: Normal. Negative for fracture or focal lesion. Sinuses/Orbits: No acute finding. Other: None. IMPRESSION: No acute intracranial abnormality. Electronically Signed   By: Valentino Saxon MD   On: 10/09/2020 16:34   DG Knee Complete 4 Views Right  Result Date: 10/09/2020 CLINICAL DATA:  Status post fall with right knee pain. EXAM: RIGHT KNEE - COMPLETE 4+ VIEW COMPARISON:  June 18, 2011 FINDINGS: No evidence of fracture, dislocation, or joint effusion. Marked narrowing medial femoral tibial joint space is noted. Soft tissues are unremarkable. IMPRESSION: No acute fracture or dislocation. Osteoarthritic changes of right knee. Electronically Signed   By: Abelardo Diesel M.D.   On: 10/09/2020 14:53   DG Hip Unilat W or Wo Pelvis 2-3 Views Right  Result Date: 10/09/2020 CLINICAL DATA:  Low back and right hip pain.  Found down. EXAM: DG HIP (WITH OR WITHOUT PELVIS) 2-3V RIGHT COMPARISON:  None. FINDINGS: The bones appear demineralized. There is no evidence of acute fracture, dislocation or femoral head avascular necrosis. There is no evidence of osseous metastatic disease. Mild degenerative changes are present at both hips. Prostate brachytherapy seeds are noted. IMPRESSION: No evidence of acute fracture, dislocation or osseous metastatic disease. Electronically  Signed   By: Richardean Sale M.D.   On: 10/09/2020 16:19    EKG: Independently reviewed.  Normal sinus rhythm no significant changes.  Assessment/Plan Principal Problem:   Syncope and collapse Active Problems:   Benign essential HTN   Diabetes ([place])   Prostate cancer ([place])     #1 syncope versus seizure: Patient has no history of seizure disorder.  Suspected syncopal episode.  Could be vasovagal but could also be cardiac.  Admit the patient for observation on telemetry.  Get echocardiogram in the morning.  Hydrate.  Monitor blood sugar.  Hypo or hyperglycemic episodes does not appear to be the cause.  If everything is negative patient could be discharged home tomorrow.  He may require a Holter monitor.  #2 diabetes: Continue sliding scale insulin and home regimen.  #3 hypertension: Continue blood pressure control using home regimen.  #4 history of prostate cancer.  We will continue to monitor.   DVT prophylaxis: Lovenox Code Status: Full code Family Communication: No family currently at bedside Disposition Plan: Home Consults called: None Admission status: Observation  Severity of Illness: The appropriate patient status for this patient is OBSERVATION. Observation status is judged to be reasonable and necessary in order to provide the required intensity of service to ensure the patient's safety. The patient's presenting symptoms, physical exam findings, and initial radiographic and laboratory data in the context of their medical condition is felt to place them at decreased risk for further clinical deterioration. Furthermore, it is anticipated that the patient will be medically stable for discharge from the hospital within 2 midnights of admission. The following factors support the patient status of observation.   " The patient's presenting symptoms include fall with personal. " The physical exam findings include no significant findings on exam. " The initial radiographic and  laboratory data are mostly within normal.     Cortlan Dolin,LAWAL MD [facility] Pager [phone]  If 7PM-7AM, please contact night-coverage [email or web address] Password [email or web address]  10/09/2020, 11:17 PM

## 2020-10-09 NOTE — ED Notes (Signed)
Attempted to call 4th floor to give report. Nurse is currently busy. Will call back

## 2020-10-09 NOTE — ED Notes (Signed)
Report called to Shirlean Mylar, RN on fourth floor

## 2020-10-10 ENCOUNTER — Observation Stay (HOSPITAL_BASED_OUTPATIENT_CLINIC_OR_DEPARTMENT_OTHER): Payer: Medicare Other

## 2020-10-10 ENCOUNTER — Other Ambulatory Visit: Payer: Self-pay

## 2020-10-10 ENCOUNTER — Encounter (HOSPITAL_COMMUNITY): Payer: Self-pay | Admitting: Internal Medicine

## 2020-10-10 DIAGNOSIS — R55 Syncope and collapse: Secondary | ICD-10-CM

## 2020-10-10 LAB — ECHOCARDIOGRAM COMPLETE
Height: 64 in
S' Lateral: 2.8 cm
Single Plane A4C EF: 67 %
Weight: 3100.55 oz

## 2020-10-10 LAB — HEMOGLOBIN A1C
Hgb A1c MFr Bld: 7.7 % — ABNORMAL HIGH (ref 4.8–5.6)
Mean Plasma Glucose: 174.29 mg/dL

## 2020-10-10 LAB — COMPREHENSIVE METABOLIC PANEL
ALT: 17 U/L (ref 0–44)
AST: 29 U/L (ref 15–41)
Albumin: 2.9 g/dL — ABNORMAL LOW (ref 3.5–5.0)
Alkaline Phosphatase: 47 U/L (ref 38–126)
Anion gap: 9 (ref 5–15)
BUN: 9 mg/dL (ref 8–23)
CO2: 25 mmol/L (ref 22–32)
Calcium: 9 mg/dL (ref 8.9–10.3)
Chloride: 104 mmol/L (ref 98–111)
Creatinine, Ser: 1.02 mg/dL (ref 0.61–1.24)
GFR, Estimated: >60 mL/min (ref >60)
Glucose, Bld: 76 mg/dL (ref 70–99)
Potassium: 4 mmol/L (ref 3.5–5.1)
Sodium: 138 mmol/L (ref 135–145)
Total Bilirubin: 1.3 mg/dL — ABNORMAL HIGH (ref 0.3–1.2)
Total Protein: 6.2 g/dL — ABNORMAL LOW (ref 6.5–8.1)

## 2020-10-10 LAB — CBC
HCT: 38 % — ABNORMAL LOW (ref 39.0–52.0)
Hemoglobin: 12.6 g/dL — ABNORMAL LOW (ref 13.0–17.0)
MCH: 28.7 pg (ref 26.0–34.0)
MCHC: 33.2 g/dL (ref 30.0–36.0)
MCV: 86.6 fL (ref 80.0–100.0)
Platelets: 199 10*3/uL (ref 150–400)
RBC: 4.39 MIL/uL (ref 4.22–5.81)
RDW: 13.8 % (ref 11.5–15.5)
WBC: 9.4 10*3/uL (ref 4.0–10.5)
nRBC: 0 % (ref 0.0–0.2)

## 2020-10-10 LAB — GLUCOSE, CAPILLARY
Glucose-Capillary: 70 mg/dL (ref 70–99)
Glucose-Capillary: 235 mg/dL — ABNORMAL HIGH (ref 70–99)
Glucose-Capillary: 259 mg/dL — ABNORMAL HIGH (ref 70–99)

## 2020-10-10 LAB — TSH: TSH: 1.562 u[IU]/mL (ref 0.350–4.500)

## 2020-10-10 MED ORDER — PERFLUTREN LIPID MICROSPHERE
1.0000 mL | INTRAVENOUS | Status: DC | PRN
  Administered 2020-10-10: 2 mL via INTRAVENOUS
  Filled 2020-10-10: qty 10

## 2020-10-10 MED ORDER — HUMULIN 70/30 (70-30) 100 UNIT/ML ~~LOC~~ SUSP: SUBCUTANEOUS | 11 refills | Status: DC

## 2020-10-10 NOTE — TOC Transition Note (Signed)
Transition of Care [facility]) - CM/SW Discharge Note   Patient Details  Name: Andre Stout MRN: [id number] Date of Birth: [birth year]  [facility] CM/SW Contact:  Ross Ludwig, LCSW Phone Number: 10/10/2020, 3:05 PM   Clinical Narrative:     CSW was informed that PT recommended SNF, however patient's family declined and would like him to come home with home health instead.  CSW provided choice of agencies for daughter Sherrie Mustache, [phone], and they said they did not have a preference.  CSW contacted [facility], and they have agreed to accept patient.  CSW was also informed that patient needed a rolling walker, CSW contacted [facility] and spoke to [place].  They will deliver the walker before he leaves.  Final next level of care: [place] Barriers to Discharge: Barriers Resolved   Patient Goals and CMS Choice Patient states their goals for this hospitalization and ongoing recovery are:: To return back home with home health. CMS Medicare.gov Compare Post Acute Care list provided to:: Patient Represenative (must comment) Choice offered to / list presented to : Adult Children  Discharge Placement  Patient discharging home with family.  Per patient's family they have 24 hour care available to help out.                     Discharge Plan and Services                DME Arranged: Gilford Rile DME Agency: [facility] Date DME Agency Contacted: 10/10/20 Time DME Agency Contacted: [phone] Representative spoke with at DME Agency: Freda Munro HH Arranged: PT, OT, RN, Social Work [employer] Agency: [place] Date [place]: 10/10/20 Time [place]: [place] Representative spoke with at [place]: [place] ([place]) Interventions     Readmission Risk Interventions No flowsheet data found.

## 2020-10-10 NOTE — Progress Notes (Signed)
  Echocardiogram 2D Echocardiogram has been performed.  Andre Stout 10/10/2020, 9:13 AM

## 2020-10-10 NOTE — Progress Notes (Deleted)
PROGRESS NOTE    [name]  CWC:[id number] DOB: December 12, 1940 DOA: 10/09/2020 PCP: [name], FNP     Brief Narrative:  Andre Stout is a 80 y.o. male with medical history significant of diabetes, prostate cancer, history of glaucoma, who was brought in by family after finding him on the floor.  They apparently talked to him early in the morning but later on patient could not remember anything beyond that conversation.  They went to the house and was not answering the door.  When they made it into the house he was on the floor apparently looks like he fell complaining of pain in the back of his knee.  Patient also was noted to have weakness.  Initially thought that patient may have had hypoglycemia but blood sugar was more than 200 as checked by family.  Patient is now back to baseline seems to have recovered.  Denied any dizziness denied any chest pain.  Denied any focal weakness.  Is presumed the patient may have had either a syncopal episode or seizure episode.  He is being admitted to the hospital for further evaluation and treatment.  New events last 24 hours / Subjective: Patient states that he felt dizzy prior to his episode of passing out.  He thinks it is because his blood sugar was low, although per family his blood sugar was greater than 200 when they checked in on him.  Today, he has no complaints.  Assessment & Plan:   Principal Problem:   Syncope and collapse Active Problems:   Benign essential HTN   Diabetes (HCC)   Prostate cancer (HCC)   Syncope -CT head unremarkable -Check orthostatic blood pressure -Echocardiogram pending -Continue telemetry  Diabetes mellitus type 2 -Hemoglobin A1c 7.7 -Sliding scale insulin  Leukocytosis -Resolved  Essential hypertension -Hold enalapril  DVT prophylaxis:  enoxaparin (LOVENOX) injection 40 mg Start: 10/09/20 2000  Code Status: Full code Family Communication: No family at bedside Disposition Plan:    Status is: Observation  The patient will require care spanning > 2 midnights and should be moved to inpatient because: Unsafe d/c plan  Dispo: The patient is from: Home              Anticipated d/c is to: SNF              Anticipated d/c date is: 1 day              Patient currently is not medically stable to d/c.  Await further work-up including echocardiogram.  SNF placement pending.   Consultants:   None  Procedures:   None  Antimicrobials:  Anti-infectives (From admission, onward)   None        Objective: Vitals:   10/09/20 2205 10/09/20 2210 10/10/20 0202 10/10/20 0618  BP: 138/75 133/71 (!) 151/81 137/71  Pulse: 81 87 62 81  Resp: 20 20 16 14   Temp:   98.2 F (36.8 C) 98.1 F (36.7 C)  TempSrc:   Oral Oral  SpO2: 100% 100% 98% 98%  Weight:      Height:        Intake/Output Summary (Last 24 hours) at 10/10/2020 1129 Last data filed at 10/10/2020 1053 Gross per 24 hour  Intake 1144.47 ml  Output 820 ml  Net 324.47 ml   Filed Weights   10/09/20 1312 10/09/20 2200  Weight: 86.2 kg 87.9 kg    Examination:  General exam: Appears calm and comfortable  Respiratory system: Clear to auscultation.  Respiratory effort normal. No respiratory distress. No conversational dyspnea.  Cardiovascular system: S1 & S2 heard, RRR. No murmurs. No pedal edema. Gastrointestinal system: Abdomen is nondistended, soft and nontender. Normal bowel sounds heard. Central nervous system: Alert and oriented. No focal neurological deficits. Speech clear.  Extremities: Symmetric in appearance  Skin: No rashes, lesions or ulcers on exposed skin  Psychiatry: Judgement and insight appear normal. Mood & affect appropriate.   Data Reviewed: I have personally reviewed following labs and imaging studies  CBC: Recent Labs  Lab 10/09/20 1417 10/10/20 0524  WBC 15.6* 9.4  HGB 15.3 12.6*  HCT 46.4 38.0*  MCV 86.1 86.6  PLT 241 664   Basic Metabolic Panel: Recent Labs  Lab  10/09/20 1417 10/10/20 0524  NA 140 138  K 3.9 4.0  CL 103 104  CO2 23 25  GLUCOSE 218* 76  BUN 12 9  CREATININE 1.23 1.02  CALCIUM 9.8 9.0   GFR: Estimated Creatinine Clearance: 58.7 mL/min (by C-G formula based on SCr of 1.02 mg/dL). Liver Function Tests: Recent Labs  Lab 10/10/20 0524  AST 29  ALT 17  ALKPHOS 47  BILITOT 1.3*  PROT 6.2*  ALBUMIN 2.9*   No results for input(s): LIPASE, AMYLASE in the last 168 hours. No results for input(s): AMMONIA in the last 168 hours. Coagulation Profile: No results for input(s): INR, PROTIME in the last 168 hours. Cardiac Enzymes: No results for input(s): CKTOTAL, CKMB, CKMBINDEX, TROPONINI in the last 168 hours. BNP (last 3 results) No results for input(s): PROBNP in the last 8760 hours. HbA1C: Recent Labs    10/10/20 0524  HGBA1C 7.7*   CBG: Recent Labs  Lab 10/09/20 1315 10/09/20 2212 10/09/20 2232 10/09/20 2321 10/10/20 0734  GLUCAP 155* 59* 69* 101* 70   Lipid Profile: No results for input(s): CHOL, HDL, LDLCALC, TRIG, CHOLHDL, LDLDIRECT in the last 72 hours. Thyroid Function Tests: Recent Labs    10/10/20 0524  TSH 1.562   Anemia Panel: No results for input(s): VITAMINB12, FOLATE, FERRITIN, TIBC, IRON, RETICCTPCT in the last 72 hours. Sepsis Labs: No results for input(s): PROCALCITON, LATICACIDVEN in the last 168 hours.  Recent Results (from the past 240 hour(s))  Respiratory Panel by RT PCR (Flu A&B, Covid) - Nasopharyngeal Swab     Status: None   Collection Time: 10/09/20  3:31 PM   Specimen: Nasopharyngeal Swab  Result Value Ref Range Status   SARS Coronavirus 2 by RT PCR NEGATIVE NEGATIVE Final    Comment: (NOTE) SARS-CoV-2 target nucleic acids are NOT DETECTED.  The SARS-CoV-2 RNA is generally detectable in upper respiratoy specimens during the acute phase of infection. The lowest concentration of SARS-CoV-2 viral copies this assay can detect is 131 copies/mL. A negative result does not  preclude SARS-Cov-2 infection and should not be used as the sole basis for treatment or other patient management decisions. A negative result may occur with  improper specimen collection/handling, submission of specimen other than nasopharyngeal swab, presence of viral mutation(s) within the areas targeted by this assay, and inadequate number of viral copies (<131 copies/mL). A negative result must be combined with clinical observations, patient history, and epidemiological information. The expected result is Negative.  Fact Sheet for Patients:  PinkCheek.be  Fact Sheet for Healthcare Providers:  GravelBags.it  This test is no t yet approved or cleared by the [place] FDA and  has been authorized for detection and/or diagnosis of SARS-CoV-2 by FDA under an Emergency Use Authorization (EUA). This EUA will  remain  in effect (meaning this test can be used) for the duration of the COVID-19 declaration under Section 564(b)(1) of the Act, 21 U.S.C. section 360bbb-3(b)(1), unless the authorization is terminated or revoked sooner.     Influenza A by PCR NEGATIVE NEGATIVE Final   Influenza B by PCR NEGATIVE NEGATIVE Final    Comment: (NOTE) The Xpert Xpress SARS-CoV-2/FLU/RSV assay is intended as an aid in  the diagnosis of influenza from Nasopharyngeal swab specimens and  should not be used as a sole basis for treatment. Nasal washings and  aspirates are unacceptable for Xpert Xpress SARS-CoV-2/FLU/RSV  testing.  Fact Sheet for Patients: PinkCheek.be  Fact Sheet for Healthcare Providers: GravelBags.it  This test is not yet approved or cleared by the [place] FDA and  has been authorized for detection and/or diagnosis of SARS-CoV-2 by  FDA under an Emergency Use Authorization (EUA). This EUA will remain  in effect (meaning this test can be used) for the  duration of the  Covid-19 declaration under Section 564(b)(1) of the Act, 21  U.S.C. section 360bbb-3(b)(1), unless the authorization is  terminated or revoked. Performed at [facility], [place] [address], [place], [place] [address]       Radiology Studies: DG Chest 1 View  Result Date: 10/09/2020 CLINICAL DATA:  Possible loss of consciousness. EXAM: CHEST  1 VIEW COMPARISON:  March 04, 2016 FINDINGS: The heart size and mediastinal contours are within normal limits. Chronic elevation of right hemidiaphragm is unchanged. Both lungs are clear. The visualized skeletal structures are unremarkable. IMPRESSION: No active disease. Electronically Signed   By: Abelardo Diesel M.D.   On: 10/09/2020 16:23   DG Lumbar Spine Complete  Result Date: 10/09/2020 CLINICAL DATA:  Low back and right hip pain.  Found down. EXAM: LUMBAR SPINE - COMPLETE 4+ VIEW COMPARISON:  Chest radiographs 03/04/2016. FINDINGS: There are 5 lumbar type vertebral bodies. The bones are diffusely demineralized. The alignment is normal. Minimal anterior wedging of the L1 vertebral body appears grossly unchanged. No evidence of acute fracture or traumatic subluxation. The disc spaces are relatively preserved. There is mild intervertebral spurring and facet hypertrophy. Scattered vascular calcifications are noted. IMPRESSION: No evidence of acute lumbar spine fracture, traumatic subluxation or significant spondylosis. Stable minimal L1 compression deformity. Diffuse osseous demineralization. Electronically Signed   By: Richardean Sale M.D.   On: 10/09/2020 16:21   CT Head Wo Contrast  Result Date: 10/09/2020 CLINICAL DATA:  Found down EXAM: CT HEAD WITHOUT CONTRAST TECHNIQUE: Contiguous axial images were obtained from the base of the skull through the vertex without intravenous contrast. COMPARISON:  None. FINDINGS: Brain: No evidence of acute infarction, hemorrhage, hydrocephalus, extra-axial collection or mass  lesion/mass effect. Periventricular white matter hypodensities consistent with sequela of chronic microvascular ischemic disease. Global parenchymal volume loss. Vascular: No hyperdense vessel or unexpected calcification. Skull: Normal. Negative for fracture or focal lesion. Sinuses/Orbits: No acute finding. Other: None. IMPRESSION: No acute intracranial abnormality. Electronically Signed   By: Valentino Saxon MD   On: 10/09/2020 16:34   DG Knee Complete 4 Views Right  Result Date: 10/09/2020 CLINICAL DATA:  Status post fall with right knee pain. EXAM: RIGHT KNEE - COMPLETE 4+ VIEW COMPARISON:  June 18, 2011 FINDINGS: No evidence of fracture, dislocation, or joint effusion. Marked narrowing medial femoral tibial joint space is noted. Soft tissues are unremarkable. IMPRESSION: No acute fracture or dislocation. Osteoarthritic changes of right knee. Electronically Signed   By: Mallie Darting.D.  On: 10/09/2020 14:53   DG Hip Unilat W or Wo Pelvis 2-3 Views Right  Result Date: 10/09/2020 CLINICAL DATA:  Low back and right hip pain.  Found down. EXAM: DG HIP (WITH OR WITHOUT PELVIS) 2-3V RIGHT COMPARISON:  None. FINDINGS: The bones appear demineralized. There is no evidence of acute fracture, dislocation or femoral head avascular necrosis. There is no evidence of osseous metastatic disease. Mild degenerative changes are present at both hips. Prostate brachytherapy seeds are noted. IMPRESSION: No evidence of acute fracture, dislocation or osseous metastatic disease. Electronically Signed   By: Richardean Sale M.D.   On: 10/09/2020 16:19      Scheduled Meds: . enoxaparin (LOVENOX) injection  40 mg Subcutaneous Q24H  . insulin aspart  0-5 Units Subcutaneous QHS  . insulin aspart  0-9 Units Subcutaneous TID WC  . sodium chloride flush  3 mL Intravenous Q12H   Continuous Infusions:   LOS: 0 days      Time spent: 35 minutes   Dessa Phi, DO [facility] 10/10/2020, 11:29 AM    Available via Epic secure chat 7am-7pm After these hours, please refer to coverage provider listed on [email or web address]

## 2020-10-10 NOTE — Plan of Care (Signed)

## 2020-10-10 NOTE — Evaluation (Signed)
Physical Therapy Evaluation Patient Details Name: Andre Stout MRN: [id number] DOB: [birth year] Today's Date: 10/10/2020   History of Present Illness  80 yo male admitted with fall, L1 comp fx-stable, R knee pain. Possible syncope vs possible Sz. Hx of DM, prostate ca, glaucoma  Clinical Impression  On eval, pt required Mod assist for mobility. He had significant difficulty mobilizing to/from bsc and attempting to take ambulatory steps. Pt c/o pain in back and R knee/LE. He remains at high risk for falls when mobilizing. Pt reported he lives alone. At this time, he is unable to safely manage at home alone. Recommend ST SNF. Will plan to follow during hospital stay.    Follow Up Recommendations SNF    Equipment Recommendations  Rolling walker with 5" wheels    Recommendations for Other Services       Precautions / Restrictions Precautions Precautions: Fall Restrictions Weight Bearing Restrictions: No      Mobility  Bed Mobility Overal bed mobility: Needs Assistance Bed Mobility: Supine to Sit;Sit to Supine     Supine to sit: Min assist;HOB elevated Sit to supine: Min assist;HOB elevated   General bed mobility comments: Assist for trunk and to scoot to EOB. Increased time. Cues for safety, technique.  Transfers Overall transfer level: Needs assistance Equipment used: Rolling walker (2 wheeled) Transfers: Sit to/from [facility] Sit to Stand: Mod assist Stand pivot transfers: Mod assist       General transfer comment: Assist to power up, stabilize, control descent. Cues for safety, technique, posture, proper use of RW, RW proximity. High fall risk.  Ambulation/Gait Min Assist; +2 safety/equipment Gait Distance (Feet): 3 Feet Assistive device: Rolling walker (2 wheeled) Gait Pattern/deviations: Step-to pattern;Trunk flexed;Antalgic;Decreased weight shift to right     General Gait Details: Assist to stabilize pt and maneuver with RW. Cues for  safety, technique, posture, RW proximity, proper use of RW. High fall risk.  Stairs            Wheelchair Mobility    Modified Rankin (Stroke Patients Only)       Balance Overall balance assessment: Needs assistance;History of Falls         Standing balance support: Bilateral upper extremity supported Standing balance-Leahy Scale: Poor                               Pertinent Vitals/Pain Pain Assessment: Faces Faces Pain Scale: Hurts whole lot Pain Location: back, R knee Pain Descriptors / Indicators: Aching;Sore;Guarding;Grimacing;Sharp Pain Intervention(s): Limited activity within patient's tolerance;Monitored during session    Home Living Family/patient expects to be discharged to:: Private residence Living Arrangements: Alone Available Help at Discharge: Family;Available PRN/intermittently Type of Home: House Home Access: Stairs to enter Entrance Stairs-Rails: None Entrance Stairs-Number of Steps: 3 Home Layout: One level Home Equipment: None      Prior Function Level of Independence: Independent               Hand Dominance        Extremity/Trunk Assessment   Upper Extremity Assessment Upper Extremity Assessment: Defer to OT evaluation    Lower Extremity Assessment Lower Extremity Assessment: Generalized weakness    Cervical / Trunk Assessment Cervical / Trunk Assessment: Normal  Communication   Communication: No difficulties  Cognition Arousal/Alertness: Awake/alert Behavior During Therapy: WFL for tasks assessed/performed Overall Cognitive Status: Within Functional Limits for tasks assessed  General Comments      Exercises     Assessment/Plan    PT Assessment Patient needs continued PT services  PT Problem List Decreased strength;Decreased mobility;Decreased activity tolerance;Decreased balance;Decreased knowledge of use of DME;Pain;Decreased safety  awareness       PT Treatment Interventions DME instruction;Gait training;Therapeutic activities;Therapeutic exercise;Patient/family education;Balance training;Functional mobility training    PT Goals (Current goals can be found in the Care Plan section)  Acute Rehab PT Goals Patient Stated Goal: less pain PT Goal Formulation: With patient Time For Goal Achievement: 10/24/20 Potential to Achieve Goals: Good    Frequency Min 2X/week   Barriers to discharge        Co-evaluation               AM-PAC PT "6 Clicks" Mobility  Outcome Measure Help needed turning from your back to your side while in a flat bed without using bedrails?: A Lot Help needed moving from lying on your back to sitting on the side of a flat bed without using bedrails?: A Lot Help needed moving to and from a bed to a chair (including a wheelchair)?: A Lot Help needed standing up from a chair using your arms (e.g., wheelchair or bedside chair)?: A Lot Help needed to walk in hospital room?: A Lot Help needed climbing 3-5 steps with a railing? : Total 6 Click Score: 11    End of Session Equipment Utilized During Treatment: Gait belt Activity Tolerance: Patient limited by fatigue;Patient limited by pain Patient left: in bed;with call bell/phone within reach;with bed alarm set   PT Visit Diagnosis: Muscle weakness (generalized) (M62.81);Pain;History of falling (Z91.81);Difficulty in walking, not elsewhere classified (R26.2) Pain - part of body:  (R knee, back)    Time: 1041-1110 PT Time Calculation (min) (ACUTE ONLY): 29 min   Charges:   PT Evaluation $PT Eval Low Complexity: 1 Low PT Treatments $Gait Training: 8-22 mins          Doreatha Massed, PT Acute Rehabilitation  Office: [phone] Pager: [phone]

## 2020-10-10 NOTE — Discharge Summary (Addendum)
Physician Discharge Summary  JOSUEL KOEPPEN HEN:[id number] DOB: 08/20/1940 DOA: 10/09/2020  PCP: [name], FNP  Admit date: 10/09/2020 Discharge date: 10/10/2020  Admitted From: Home Disposition:  Home, family declined SNF. Stated they will have 24 hour care available at home   Recommendations for Outpatient Follow-up:  1. Follow up with PCP in 1 week 2. Follow up with endocrinology in 1 week   Discharge Condition: Stable CODE STATUS: Full  Diet recommendation:  Diet Orders (From admission, onward)    Start     Ordered   10/09/20 1828  Diet heart healthy/carb modified Room service appropriate? Yes; Fluid consistency: Thin  Diet effective now       Question Answer Comment  Diet-HS Snack? Nothing   Room service appropriate? Yes   Fluid consistency: Thin      10/09/20 1827         Brief/Interim Summary: Daryel Gerald a 80 y.o.malewith medical history significant ofdiabetes, prostate cancer, history of glaucoma, who was brought in by family after finding him on the floor. They apparently talked to him early in the morning but later on patient could not remember anything beyond that conversation. They went to the house and was not answering the door. When they made it into the house he was on the floor apparently looks like he fell complaining of pain in the back of his knee. Patient also was noted to have weakness. Initially thought that patient may have had hypoglycemia but blood sugar was more than 200 as checked by family. Patient is now back to baseline seems to have recovered. Denied any dizziness denied any chest pain. Denied any focal weakness. Is presumed the patient may have had either a syncopal episode orseizure episode. He was admitted to the hospital for further evaluation and treatment.   Symptoms thought to be due to hypoglycemic episode. Daughter states that patient took his insulin 70/30 45 units at 8am, then started to sound confused  later that morning. Then they found him on the floor at home. His blood sugars during hospitalization has ranged from as low as 59 to as high as 235 (but mostly in the 70-100 range). His Hgb is 7.7. I suspect his insulin regimen will have to be adjusted. Patient worked with PT who recommended SNF placement. However, family declined and stated that they plan to have 24 hour care at discharge. Echo unremarkable and orthostatic VS negative.    Discharge Diagnoses:  Principal Problem:   Syncope and collapse Active Problems:   Benign essential HTN   Diabetes (HCC)   Prostate cancer (HCC) Leukocytosis - resolved    Discharge Instructions  Discharge Instructions    Call MD for:  difficulty breathing, headache or visual disturbances   Complete by: As directed    Call MD for:  extreme fatigue   Complete by: As directed    Call MD for:  persistant dizziness or light-headedness   Complete by: As directed    Call MD for:  persistant nausea and vomiting   Complete by: As directed    Call MD for:  severe uncontrolled pain   Complete by: As directed    Call MD for:  temperature >100.4   Complete by: As directed    Discharge instructions   Complete by: As directed    You were cared for by a hospitalist during your hospital stay. If you have any questions about your discharge medications or the care you received while you were in the hospital  after you are discharged, you can call the unit and ask to speak with the hospitalist on call if the hospitalist that took care of you is not available. Once you are discharged, your primary care physician will handle any further medical issues. Please note that NO REFILLS for any discharge medications will be authorized once you are discharged, as it is imperative that you return to your primary care physician (or establish a relationship with a primary care physician if you do not have one) for your aftercare needs so that they can reassess your need for  medications and monitor your lab values.   Increase activity slowly   Complete by: As directed      Allergies as of 10/10/2020   No Known Allergies     Medication List    STOP taking these medications   enalapril 2.5 MG tablet Commonly known as: Vasotec   mupirocin ointment 2 % Commonly known as: Bactroban     TAKE these medications   Alphagan P 0.15 % ophthalmic solution Generic drug: brimonidine Place 1 drop into the left eye 2 (two) times daily.   CENTRUM ADULTS PO Take 1 tablet by mouth daily.   Cod Liver Oil 1000 MG Caps Take 1 capsule by mouth daily.   Combigan 0.2-0.5 % ophthalmic solution Generic drug: brimonidine-timolol Place 1 drop into both eyes 2 (two) times daily.   dorzolamide 2 % ophthalmic solution Commonly known as: TRUSOPT Place 1 drop into both eyes daily.   HumuLIN 70/30 (70-30) 100 UNIT/ML injection Generic drug: insulin NPH-regular Human 35 units with breakfast, and 25 with supper What changed: additional instructions   Insulin Syringe-Needle U-100 31G X 5/16" 1 ML Misc Commonly known as: BD Insulin Syringe U/F USE 2 DAILY   Omega 3 1200 MG Caps Take 1 capsule by mouth daily.   OneTouch Delica Lancets 67H Misc Use to check blood sugar 4 times per day. Dx code: E11.9   OneTouch Ultra test strip Generic drug: glucose blood USE TO MONITOR GLUCOSE LEVELS 4 TIMES PER DAY E11.9   PRESCRIPTION MEDICATION Gets injections in eyes at eye dr's office   URINOZINC PO Take 1 tablet by mouth daily.   Vitamin D3 50 MCG (2000 UT) Tabs Take 1 tablet by mouth daily.       Follow-up Information    [name], FNP. Schedule an appointment as soon as possible for a visit.   Specialty: Internal Medicine Contact information: [place] [address] [phone]        Renato Shin, MD. Call.   Specialty: Endocrinology Contact information: [address] [place]  [address] [phone]              No Known Allergies  Consultations:  None    Procedures/Studies: DG Chest 1 View  Result Date: 10/09/2020 CLINICAL DATA:  Possible loss of consciousness. EXAM: CHEST  1 VIEW COMPARISON:  March 04, 2016 FINDINGS: The heart size and mediastinal contours are within normal limits. Chronic elevation of right hemidiaphragm is unchanged. Both lungs are clear. The visualized skeletal structures are unremarkable. IMPRESSION: No active disease. Electronically Signed   By: Abelardo Diesel M.D.   On: 10/09/2020 16:23   DG Lumbar Spine Complete  Result Date: 10/09/2020 CLINICAL DATA:  Low back and right hip pain.  Found down. EXAM: LUMBAR SPINE - COMPLETE 4+ VIEW COMPARISON:  Chest radiographs 03/04/2016. FINDINGS: There are 5 lumbar type vertebral bodies. The bones are diffusely demineralized.  The alignment is normal. Minimal anterior wedging of the L1 vertebral body appears grossly unchanged. No evidence of acute fracture or traumatic subluxation. The disc spaces are relatively preserved. There is mild intervertebral spurring and facet hypertrophy. Scattered vascular calcifications are noted. IMPRESSION: No evidence of acute lumbar spine fracture, traumatic subluxation or significant spondylosis. Stable minimal L1 compression deformity. Diffuse osseous demineralization. Electronically Signed   By: Richardean Sale M.D.   On: 10/09/2020 16:21   CT Head Wo Contrast  Result Date: 10/09/2020 CLINICAL DATA:  Found down EXAM: CT HEAD WITHOUT CONTRAST TECHNIQUE: Contiguous axial images were obtained from the base of the skull through the vertex without intravenous contrast. COMPARISON:  None. FINDINGS: Brain: No evidence of acute infarction, hemorrhage, hydrocephalus, extra-axial collection or mass lesion/mass effect. Periventricular white matter hypodensities consistent with sequela of chronic microvascular ischemic disease. Global parenchymal volume loss. Vascular: No  hyperdense vessel or unexpected calcification. Skull: Normal. Negative for fracture or focal lesion. Sinuses/Orbits: No acute finding. Other: None. IMPRESSION: No acute intracranial abnormality. Electronically Signed   By: Valentino Saxon MD   On: 10/09/2020 16:34   DG Knee Complete 4 Views Right  Result Date: 10/09/2020 CLINICAL DATA:  Status post fall with right knee pain. EXAM: RIGHT KNEE - COMPLETE 4+ VIEW COMPARISON:  June 18, 2011 FINDINGS: No evidence of fracture, dislocation, or joint effusion. Marked narrowing medial femoral tibial joint space is noted. Soft tissues are unremarkable. IMPRESSION: No acute fracture or dislocation. Osteoarthritic changes of right knee. Electronically Signed   By: Abelardo Diesel M.D.   On: 10/09/2020 14:53   ECHOCARDIOGRAM COMPLETE  Result Date: 10/10/2020    ECHOCARDIOGRAM REPORT   Patient Name:   CHUN SELLEN Date of Exam: 10/10/2020 Medical Rec #:  [id number]      Height:       64.0 in Accession #:    [id number]     Weight:       193.8 lb Date of Birth:  1940/04/03     BSA:          1.930 m Patient Age:    80 years       BP:           137/71 mmHg Patient Gender: M              HR:           82 bpm. Exam Location:  Inpatient Procedure: 2D Echo, Cardiac Doppler, Color Doppler and Intracardiac            Opacification Agent Indications:    Syncope 780.2 / R55  History:        Patient has no prior history of Echocardiogram examinations.                 Risk Factors:Hypertension, Diabetes and Former Smoker.  Sonographer:    Vickie Epley RDCS Referring Phys: [id number] Julious Oka  Sonographer Comments: Image acquisition challenging due to patient body habitus. IMPRESSIONS  1. Left ventricular ejection fraction, by estimation, is 60 to 65%. The left ventricle has normal function. The left ventricle has no regional wall motion abnormalities. Left ventricular diastolic parameters were normal.  2. Right ventricular systolic function is normal. The right ventricular size  is normal. Tricuspid regurgitation signal is inadequate for assessing PA pressure.  3. The mitral valve is normal in structure. Trivial mitral valve regurgitation. No evidence of mitral stenosis.  4. The aortic valve is tricuspid. There is mild calcification of the aortic valve.  Aortic valve regurgitation is not visualized. No aortic stenosis is present.  5. The inferior vena cava is normal in size with greater than 50% respiratory variability, suggesting right atrial pressure of 3 mmHg. Comparison(s): No prior Echocardiogram. Conclusion(s)/Recommendation(s): Otherwise normal echocardiogram, with minor abnormalities described in the report. FINDINGS  Left Ventricle: On contrast images, there is some moderate to possibly severe focal basal septal hypertrophy. Left ventricular ejection fraction, by estimation, is 60 to 65%. The left ventricle has normal function. The left ventricle has no regional wall motion abnormalities. Definity contrast agent was given IV to delineate the left ventricular endocardial borders. The left ventricular internal cavity size was normal in size. There is no left ventricular hypertrophy. Left ventricular diastolic parameters were normal. Right Ventricle: The right ventricular size is normal. No increase in right ventricular wall thickness. Right ventricular systolic function is normal. Tricuspid regurgitation signal is inadequate for assessing PA pressure. Left Atrium: Left atrial size was normal in size. Right Atrium: Right atrial size was normal in size. Pericardium: Trivial pericardial effusion is present. Presence of pericardial fat pad. Mitral Valve: The mitral valve is normal in structure. Trivial mitral valve regurgitation. No evidence of mitral valve stenosis. Tricuspid Valve: The tricuspid valve is normal in structure. Tricuspid valve regurgitation is trivial. No evidence of tricuspid stenosis. Aortic Valve: The aortic valve is tricuspid. There is mild calcification of the aortic  valve. Aortic valve regurgitation is not visualized. No aortic stenosis is present. Pulmonic Valve: The pulmonic valve was not well visualized. Pulmonic valve regurgitation is not visualized. Aorta: The aortic root, ascending aorta and aortic arch are all structurally normal, with no evidence of dilitation or obstruction. Venous: The inferior vena cava is normal in size with greater than 50% respiratory variability, suggesting right atrial pressure of 3 mmHg. IAS/Shunts: No atrial level shunt detected by color flow Doppler.  LEFT VENTRICLE PLAX 2D LVIDd:         4.30 cm LVIDs:         2.80 cm LV PW:         1.00 cm LV IVS:        1.00 cm LVOT diam:     1.90 cm LV SV:         41 LV SV Index:   21 LVOT Area:     2.84 cm  LV Volumes (MOD) LV vol d, MOD A4C: 75.5 ml LV vol s, MOD A4C: 24.9 ml LV SV MOD A4C:     75.5 ml LEFT ATRIUM             Index       RIGHT ATRIUM          Index LA diam:        3.70 cm 1.92 cm/m  RA Area:     9.75 cm LA Vol (A2C):   23.9 ml 12.38 ml/m RA Volume:   21.70 ml 11.24 ml/m LA Vol (A4C):   33.7 ml 17.46 ml/m LA Biplane Vol: 30.8 ml 15.96 ml/m  AORTIC VALVE LVOT Vmax:   86.50 cm/s LVOT Vmean:  58.800 cm/s LVOT VTI:    0.146 m  AORTA Ao Root diam: 3.60 cm  SHUNTS Systemic VTI:  0.15 m Systemic Diam: 1.90 cm Buford Dresser MD Electronically signed by Buford Dresser MD Signature Date/Time: 10/10/2020/2:22:55 PM    Final    DG Hip Unilat W or Wo Pelvis 2-3 Views Right  Result Date: 10/09/2020 CLINICAL DATA:  Low back and right hip pain.  Found down. EXAM: DG HIP (WITH OR WITHOUT PELVIS) 2-3V RIGHT COMPARISON:  None. FINDINGS: The bones appear demineralized. There is no evidence of acute fracture, dislocation or femoral head avascular necrosis. There is no evidence of osseous metastatic disease. Mild degenerative changes are present at both hips. Prostate brachytherapy seeds are noted. IMPRESSION: No evidence of acute fracture, dislocation or osseous metastatic disease.  Electronically Signed   By: Richardean Sale M.D.   On: 10/09/2020 16:19        Discharge Exam: Vitals:   10/10/20 1306 10/10/20 1307  BP: (!) 164/92 (!) 169/94  Pulse: 91 89  Resp:    Temp:    SpO2: 100%     General: Pt is alert, awake, not in acute distress Cardiovascular: RRR, S1/S2 +, no edema Respiratory: CTA bilaterally, no wheezing, no rhonchi, no respiratory distress, no conversational dyspnea  Abdominal: Soft, NT, ND, bowel sounds + Extremities: no edema, no cyanosis Psych: Normal mood and affect, stable judgement and insight     The results of significant diagnostics from this hospitalization (including imaging, microbiology, ancillary and laboratory) are listed below for reference.     Microbiology: Recent Results (from the past 240 hour(s))  Respiratory Panel by RT PCR (Flu A&B, Covid) - Nasopharyngeal Swab     Status: None   Collection Time: 10/09/20  3:31 PM   Specimen: Nasopharyngeal Swab  Result Value Ref Range Status   SARS Coronavirus 2 by RT PCR NEGATIVE NEGATIVE Final    Comment: (NOTE) SARS-CoV-2 target nucleic acids are NOT DETECTED.  The SARS-CoV-2 RNA is generally detectable in upper respiratoy specimens during the acute phase of infection. The lowest concentration of SARS-CoV-2 viral copies this assay can detect is 131 copies/mL. A negative result does not preclude SARS-Cov-2 infection and should not be used as the sole basis for treatment or other patient management decisions. A negative result may occur with  improper specimen collection/handling, submission of specimen other than nasopharyngeal swab, presence of viral mutation(s) within the areas targeted by this assay, and inadequate number of viral copies (<131 copies/mL). A negative result must be combined with clinical observations, patient history, and epidemiological information. The expected result is Negative.  Fact Sheet for Patients:   PinkCheek.be  Fact Sheet for Healthcare Providers:  GravelBags.it  This test is no t yet approved or cleared by the [place] FDA and  has been authorized for detection and/or diagnosis of SARS-CoV-2 by FDA under an Emergency Use Authorization (EUA). This EUA will remain  in effect (meaning this test can be used) for the duration of the COVID-19 declaration under Section 564(b)(1) of the Act, 21 U.S.C. section 360bbb-3(b)(1), unless the authorization is terminated or revoked sooner.     Influenza A by PCR NEGATIVE NEGATIVE Final   Influenza B by PCR NEGATIVE NEGATIVE Final    Comment: (NOTE) The Xpert Xpress SARS-CoV-2/FLU/RSV assay is intended as an aid in  the diagnosis of influenza from Nasopharyngeal swab specimens and  should not be used as a sole basis for treatment. Nasal washings and  aspirates are unacceptable for Xpert Xpress SARS-CoV-2/FLU/RSV  testing.  Fact Sheet for Patients: PinkCheek.be  Fact Sheet for Healthcare Providers: GravelBags.it  This test is not yet approved or cleared by the [place] FDA and  has been authorized for detection and/or diagnosis of SARS-CoV-2 by  FDA under an Emergency Use Authorization (EUA). This EUA will remain  in effect (meaning this test can be used) for the duration of the  Covid-19  declaration under Section 564(b)(1) of the Act, 21  U.S.C. section 360bbb-3(b)(1), unless the authorization is  terminated or revoked. Performed at [facility], [place] [address], [place], [place] [address]      Labs: BNP (last 3 results) No results for input(s): BNP in the last 8760 hours. Basic Metabolic Panel: Recent Labs  Lab 10/09/20 1417 10/10/20 0524  NA 140 138  K 3.9 4.0  CL 103 104  CO2 23 25  GLUCOSE 218* 76  BUN 12 9  CREATININE 1.23 1.02  CALCIUM 9.8 9.0   Liver Function  Tests: Recent Labs  Lab 10/10/20 0524  AST 29  ALT 17  ALKPHOS 47  BILITOT 1.3*  PROT 6.2*  ALBUMIN 2.9*   No results for input(s): LIPASE, AMYLASE in the last 168 hours. No results for input(s): AMMONIA in the last 168 hours. CBC: Recent Labs  Lab 10/09/20 1417 10/10/20 0524  WBC 15.6* 9.4  HGB 15.3 12.6*  HCT 46.4 38.0*  MCV 86.1 86.6  PLT 241 199   Cardiac Enzymes: No results for input(s): CKTOTAL, CKMB, CKMBINDEX, TROPONINI in the last 168 hours. BNP: Invalid input(s): POCBNP CBG: Recent Labs  Lab 10/09/20 2212 10/09/20 2232 10/09/20 2321 10/10/20 0734 10/10/20 1153  GLUCAP 59* 69* 101* 70 235*   D-Dimer No results for input(s): DDIMER in the last 72 hours. Hgb A1c Recent Labs    10/10/20 0524  HGBA1C 7.7*   Lipid Profile No results for input(s): CHOL, HDL, LDLCALC, TRIG, CHOLHDL, LDLDIRECT in the last 72 hours. Thyroid function studies Recent Labs    10/10/20 0524  TSH 1.562   Anemia work up No results for input(s): VITAMINB12, FOLATE, FERRITIN, TIBC, IRON, RETICCTPCT in the last 72 hours. Urinalysis    Component Value Date/Time   COLORURINE YELLOW 10/09/2020 [place] 10/09/2020 1327   LABSPEC 1.020 10/09/2020 1327   PHURINE 6.0 10/09/2020 1327   GLUCOSEU 150 (A) 10/09/2020 1327   HGBUR NEGATIVE 10/09/2020 1327   BILIRUBINUR NEGATIVE 10/09/2020 1327   KETONESUR 5 (A) 10/09/2020 1327   PROTEINUR 30 (A) 10/09/2020 1327   UROBILINOGEN 1.0 10/04/2009 1109   NITRITE NEGATIVE 10/09/2020 1327   LEUKOCYTESUR NEGATIVE 10/09/2020 1327   Sepsis Labs Invalid input(s): PROCALCITONIN,  WBC,  LACTICIDVEN Microbiology Recent Results (from the past 240 hour(s))  Respiratory Panel by RT PCR (Flu A&B, Covid) - Nasopharyngeal Swab     Status: None   Collection Time: 10/09/20  3:31 PM   Specimen: Nasopharyngeal Swab  Result Value Ref Range Status   SARS Coronavirus 2 by RT PCR NEGATIVE NEGATIVE Final    Comment: (NOTE) SARS-CoV-2  target nucleic acids are NOT DETECTED.  The SARS-CoV-2 RNA is generally detectable in upper respiratoy specimens during the acute phase of infection. The lowest concentration of SARS-CoV-2 viral copies this assay can detect is 131 copies/mL. A negative result does not preclude SARS-Cov-2 infection and should not be used as the sole basis for treatment or other patient management decisions. A negative result may occur with  improper specimen collection/handling, submission of specimen other than nasopharyngeal swab, presence of viral mutation(s) within the areas targeted by this assay, and inadequate number of viral copies (<131 copies/mL). A negative result must be combined with clinical observations, patient history, and epidemiological information. The expected result is Negative.  Fact Sheet for Patients:  PinkCheek.be  Fact Sheet for Healthcare Providers:  GravelBags.it  This test is no t yet approved or cleared by the [place] FDA  and  has been authorized for detection and/or diagnosis of SARS-CoV-2 by FDA under an Emergency Use Authorization (EUA). This EUA will remain  in effect (meaning this test can be used) for the duration of the COVID-19 declaration under Section 564(b)(1) of the Act, 21 U.S.C. section 360bbb-3(b)(1), unless the authorization is terminated or revoked sooner.     Influenza A by PCR NEGATIVE NEGATIVE Final   Influenza B by PCR NEGATIVE NEGATIVE Final    Comment: (NOTE) The Xpert Xpress SARS-CoV-2/FLU/RSV assay is intended as an aid in  the diagnosis of influenza from Nasopharyngeal swab specimens and  should not be used as a sole basis for treatment. Nasal washings and  aspirates are unacceptable for Xpert Xpress SARS-CoV-2/FLU/RSV  testing.  Fact Sheet for Patients: PinkCheek.be  Fact Sheet for Healthcare  Providers: GravelBags.it  This test is not yet approved or cleared by the [place] FDA and  has been authorized for detection and/or diagnosis of SARS-CoV-2 by  FDA under an Emergency Use Authorization (EUA). This EUA will remain  in effect (meaning this test can be used) for the duration of the  Covid-19 declaration under Section 564(b)(1) of the Act, 21  U.S.C. section 360bbb-3(b)(1), unless the authorization is  terminated or revoked. Performed at [facility], [place] [address], [place], [place] [address]      Patient was seen and examined on the day of discharge and was found to be in stable condition. Time coordinating discharge: 35 minutes including assessment and coordination of care, as well as examination of the patient.   SIGNED:  Dessa Phi, DO [facility] 10/10/2020, 2:26 PM

## 2020-11-02 ENCOUNTER — Encounter (INDEPENDENT_AMBULATORY_CARE_PROVIDER_SITE_OTHER): Payer: Medicare Other | Admitting: Ophthalmology

## 2020-11-15 ENCOUNTER — Encounter: Payer: Self-pay | Admitting: Endocrinology

## 2020-11-15 ENCOUNTER — Ambulatory Visit (INDEPENDENT_AMBULATORY_CARE_PROVIDER_SITE_OTHER): Payer: Medicare Other | Admitting: Endocrinology

## 2020-11-15 ENCOUNTER — Other Ambulatory Visit: Payer: Self-pay

## 2020-11-15 VITALS — BP 128/64 | HR 96 | Ht 64.0 in | Wt 186.4 lb

## 2020-11-15 DIAGNOSIS — E10311 Type 1 diabetes mellitus with unspecified diabetic retinopathy with macular edema: Secondary | ICD-10-CM | POA: Diagnosis not present

## 2020-11-15 LAB — POCT GLYCOSYLATED HEMOGLOBIN (HGB A1C): Hemoglobin A1C: 8.3 % — AB (ref 4.0–5.6)

## 2020-11-15 MED ORDER — HUMULIN 70/30 (70-30) 100 UNIT/ML ~~LOC~~ SUSP
35.0000 [IU] | Freq: Two times a day (BID) | SUBCUTANEOUS | 11 refills | Status: DC
Start: 2020-11-15 — End: 2021-03-17

## 2020-11-15 NOTE — Progress Notes (Signed)
Subjective:    Patient ID: Andre Stout, male    DOB: [birth year], 80 y.o.   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  DKA: once, in 2017.  Severe hypoglycemia: once (2015) Pancreatitis: never.   SDOH: He is not a pump candidate, due to not recording cbg's; he takes human insulin, due to cost.   Other: emphasizing basal insulin did not help; he did not tolerate pioglitizone (edema); he takes BID insulin, after poor results with multiple daily injections; he was changed to 70/30, due to pattern of cbg's.  Interval history: He had severe hypoglycemia last month.  This happened when he took am insulin, but did not eat breakfast.  He takes 35 units with breakfast, and 35 units with lunch.  no cbg record, but states cbg's vary from 72-300.  It is in general lowest at 3 PM.   Past Medical History:  Diagnosis Date  . Diabetes mellitus without complication ([place])   . Glaucoma   . Prostate cancer ([place])    Been 3-4 years ago    Past Surgical History:  Procedure Laterality Date  . CATARACT EXTRACTION Bilateral     Social History   Socioeconomic History  . Marital status: Widowed    Spouse name: Not on file  . Number of children: 1  . Years of education: 16  . Highest education level: Not on file  Occupational History  . Occupation: Retired  Tobacco Use  . Smoking status: Former Smoker    Packs/day: 0.50    Years: 4.00    Pack years: 2.00    Types: Cigarettes    Quit date: 10/29/1974    Years since quitting: 46.0  . Smokeless tobacco: Never Used  Vaping Use  . Vaping Use: Never used  Substance and Sexual Activity  . Alcohol use: No  . Drug use: No  . Sexual activity: Not Currently  Other Topics Concern  . Not on file  Social History Narrative   Born and raised in [place], [place]. Currently reside in a private residence by himself. Daughter lives in the area. No live. Fun:  hunt and fish   Denies religious beliefs that would effect health care.    Social Determinants of Health   Financial Resource Strain:   . Difficulty of Paying Living Expenses: Not on file  Food Insecurity:   . Worried About Charity fundraiser in the Last Year: Not on file  . Ran Out of Food in the Last Year: Not on file  Transportation Needs:   . Lack of Transportation (Medical): Not on file  . Lack of Transportation (Non-Medical): Not on file  Physical Activity:   . Days of Exercise per Week: Not on file  . Minutes of Exercise per Session: Not on file  Stress:   . Feeling of Stress : Not on file  Social Connections:   . Frequency of Communication with Friends and Family: Not on file  . Frequency of Social Gatherings with Friends and Family: Not on file  . Attends Religious Services: Not on file  . Active Member of Clubs or Organizations: Not on file  . Attends Archivist Meetings: Not on file  . Marital Status: Not on file  Intimate Partner Violence:   . Fear of Current or Ex-Partner: Not on file  . Emotionally Abused: Not on file  . Physically Abused: Not  on file  . Sexually Abused: Not on file    Current Outpatient Medications on File Prior to Visit  Medication Sig Dispense Refill  . ALPHAGAN P 0.15 % ophthalmic solution Place 1 drop into the left eye 2 (two) times daily.     . Cholecalciferol (VITAMIN D3) 2000 UNITS TABS Take 1 tablet by mouth daily.     Marland Kitchen Cod Liver Oil 1000 MG CAPS Take 1 capsule by mouth daily.     . COMBIGAN 0.2-0.5 % ophthalmic solution Place 1 drop into both eyes 2 (two) times daily.  12  . dorzolamide (TRUSOPT) 2 % ophthalmic solution Place 1 drop into both eyes daily.     . Insulin Syringe-Needle U-100 (BD INSULIN SYRINGE U/F) 31G X 5/16" 1 ML MISC USE 2 DAILY 200 each 4  . Misc Natural Products (URINOZINC PO) Take 1 tablet by mouth daily.     . Multiple Vitamins-Minerals (CENTRUM ADULTS PO) Take 1 tablet by mouth daily.     . Omega 3  1200 MG CAPS Take 1 capsule by mouth daily.     Glory Rosebush DELICA LANCETS 74B MISC Use to check blood sugar 4 times per day. Dx code: E11.9 200 each 2  . ONETOUCH ULTRA test strip USE TO MONITOR GLUCOSE LEVELS 4 TIMES PER DAY E11.9 100 each 2  . PRESCRIPTION MEDICATION Gets injections in eyes at eye dr's office     No current facility-administered medications on file prior to visit.    No Known Allergies  Family History  Problem Relation Age of Onset  . Diabetes Father   . Diabetes Paternal Grandfather     BP 128/64   Pulse 96   Ht 5\' 4"  (1.626 m)   Wt 186 lb 6 oz (84.5 kg)   SpO2 99%   BMI 31.99 kg/m    Review of Systems     Objective:   Physical Exam VITAL SIGNS:  See vs page GENERAL: no distress Pulses: dorsalis pedis intact bilat.   MSK: no deformity of the feet.   CV: 2+ bilat leg edema.   Skin:  no ulcer on the feet.  normal color and temp on the feet.   Neuro: sensation is intact to touch on the feet.  Ext: there is bilateral onychomycosis of the toenails.     Lab Results  Component Value Date   HGBA1C 7.7 (H) 10/10/2020       Assessment & Plan:  Type 1 DM, with CRI Hypoglycemia, severe, due to insulin: this limits aggressiveness of glycemic control  Patient Instructions  check your blood sugar twice a day.  vary the time of day when you check, between before the 3 meals, and at bedtime.  also check if you have symptoms of your blood sugar being too high or too low.  please keep a record of the readings and bring it to your next appointment here.  You can write it on any piece of paper.  please call us sooner if your blood sugar goes below 70, or if you have a lot of readings over 200.  Please change the insulin to 35 units twice a day (with breakfast and with supper).   On this type of insulin schedule, you should eat meals on a regular schedule (especially lunch).  If a meal is missed or significantly delayed, your blood sugar could go low.   Also, it is  critically important to take this insulin a meal.  Please come back for a follow-up  appointment in 2 months.

## 2020-11-15 NOTE — Patient Instructions (Addendum)
check your blood sugar twice a day.  vary the time of day when you check, between before the 3 meals, and at bedtime.  also check if you have symptoms of your blood sugar being too high or too low.  please keep a record of the readings and bring it to your next appointment here.  You can write it on any piece of paper.  please call us sooner if your blood sugar goes below 70, or if you have a lot of readings over 200.  Please change the insulin to 35 units twice a day (with breakfast and with supper).   On this type of insulin schedule, you should eat meals on a regular schedule (especially lunch).  If a meal is missed or significantly delayed, your blood sugar could go low.   Also, it is critically important to take this insulin a meal.  Please come back for a follow-up appointment in 2 months.

## 2020-11-28 ENCOUNTER — Telehealth: Payer: Self-pay | Admitting: Family

## 2020-11-28 NOTE — Telephone Encounter (Signed)
Crystal was inform that "We cannot complete the remainder of his home health care notes. He needs to have an in person visit here.  The other option would be to call the home health agency and see if they would ask Dr. Loanne Drilling if he wants to sign.  We can't sign anything else for him. "

## 2020-11-28 NOTE — Telephone Encounter (Signed)
We cannot complete the remainder of his home health care notes. He needs to have an in person visit here.  The other option would be to call the home health agency and see if they would ask Dr. Loanne Drilling if he wants to sign.  We can't sign anything else for him.

## 2020-11-28 NOTE — Telephone Encounter (Signed)
In regards to the last telephone encounter. The fax to [phone] was sent at 12:46 Result was ok

## 2020-11-28 NOTE — Telephone Encounter (Signed)
Crystal [facility] home health  [phone]  Face to face addendum order clarification sent on 11.24.21  Have not received  Please fax to [phone]

## 2021-01-02 ENCOUNTER — Telehealth: Payer: Self-pay | Admitting: Family

## 2021-01-02 NOTE — Telephone Encounter (Signed)
Left voicemail for patient to return office call.

## 2021-01-02 NOTE — Telephone Encounter (Signed)
His home health agency has asked [place] to sign off his paperwork. He needs an OV here for this to be done. I think we notified the agency in November about this but it doesn't look like the patient was made aware.  Please schedule an OV so we can document that we have seen him in person and can complete the necessary homehealth paperwork. This is a Medicare requirement.

## 2021-01-17 ENCOUNTER — Ambulatory Visit: Payer: Medicare Other | Admitting: Endocrinology

## 2021-02-06 NOTE — Telephone Encounter (Signed)
Received home health order from [facility]; pt needs to schedule OV before orders given.  Has not had OV since May 2020.

## 2021-02-07 NOTE — Telephone Encounter (Signed)
Called all the contact numbers for pt & LVM instructing pt to call & make appt.  Will also send mychart message.

## 2021-02-09 ENCOUNTER — Ambulatory Visit: Payer: Medicare Other | Admitting: Podiatry

## 2021-02-13 ENCOUNTER — Ambulatory Visit (INDEPENDENT_AMBULATORY_CARE_PROVIDER_SITE_OTHER): Payer: Medicare Other | Admitting: Podiatry

## 2021-02-13 ENCOUNTER — Encounter: Payer: Self-pay | Admitting: Podiatry

## 2021-02-13 ENCOUNTER — Other Ambulatory Visit: Payer: Self-pay

## 2021-02-13 DIAGNOSIS — M79674 Pain in right toe(s): Secondary | ICD-10-CM | POA: Diagnosis not present

## 2021-02-13 DIAGNOSIS — E1065 Type 1 diabetes mellitus with hyperglycemia: Secondary | ICD-10-CM | POA: Diagnosis not present

## 2021-02-13 DIAGNOSIS — M79675 Pain in left toe(s): Secondary | ICD-10-CM | POA: Diagnosis not present

## 2021-02-13 DIAGNOSIS — B351 Tinea unguium: Secondary | ICD-10-CM | POA: Diagnosis not present

## 2021-02-13 NOTE — Progress Notes (Signed)
  Subjective:  Patient ID: Andre Stout, male    DOB: [birth year],  MRN: [id number]  Chief Complaint  Patient presents with  . routine foot care    Nail trim     81 y.o. male presents with the above complaint. History confirmed with patient. He is diabetic. Denies numbness/tingling/burning.  Objective:  Physical Exam: warm, good capillary refill, no trophic changes or ulcerative lesions, normal DP and PT pulses and normal sensory exam. He has thickened elongated toenails with brown discoloration. Assessment:  No diagnosis found.   Plan:  Patient was evaluated and treated and all questions answered.  Patient educated on diabetes. Discussed proper diabetic foot care and discussed risks and complications of disease. Educated patient in depth on reasons to return to the office immediately should he/she discover anything concerning or new on the feet. All questions answered. Discussed proper shoes as well.   Discussed the etiology and treatment options for the condition in detail with the patient. Educated patient on the topical and oral treatment options for mycotic nails. Recommended debridement of the nails today. Sharp and mechanical debridement performed of all painful and mycotic nails today. Nails debrided in length and thickness using a nail nipper to level of comfort. Discussed treatment options including appropriate shoe gear. Follow up as needed for painful nails.   Return in about 3 months (around 05/13/2021).

## 2021-02-15 ENCOUNTER — Telehealth: Payer: Medicare Other | Admitting: Family

## 2021-02-15 ENCOUNTER — Other Ambulatory Visit: Payer: Self-pay

## 2021-02-15 DIAGNOSIS — R55 Syncope and collapse: Secondary | ICD-10-CM

## 2021-02-15 NOTE — Telephone Encounter (Signed)
LVM instructing daughter that we would like to schedule pt for in-person visit.

## 2021-02-15 NOTE — Telephone Encounter (Signed)
LVM instructing pt that he will need in-person visit per FNP.

## 2021-02-17 NOTE — Progress Notes (Signed)
Patient did not ever log on for his virtual visit and did not respond to repeated phone calls; will not charge patient for visit as he was asked at last minute to switch from in person visit to [place];

## 2021-02-20 ENCOUNTER — Ambulatory Visit (INDEPENDENT_AMBULATORY_CARE_PROVIDER_SITE_OTHER): Payer: Medicare Other | Admitting: Family

## 2021-02-20 ENCOUNTER — Encounter (INDEPENDENT_AMBULATORY_CARE_PROVIDER_SITE_OTHER): Payer: Medicare Other | Admitting: Ophthalmology

## 2021-02-20 ENCOUNTER — Encounter: Payer: Self-pay | Admitting: Family

## 2021-02-20 ENCOUNTER — Other Ambulatory Visit: Payer: Self-pay

## 2021-02-20 VITALS — BP 118/64 | HR 94 | Temp 97.6°F | Resp 18 | Ht 64.0 in | Wt 188.4 lb

## 2021-02-20 DIAGNOSIS — R55 Syncope and collapse: Secondary | ICD-10-CM | POA: Diagnosis not present

## 2021-02-20 DIAGNOSIS — C61 Malignant neoplasm of prostate: Secondary | ICD-10-CM | POA: Diagnosis not present

## 2021-02-20 DIAGNOSIS — E1065 Type 1 diabetes mellitus with hyperglycemia: Secondary | ICD-10-CM

## 2021-02-20 LAB — COMPREHENSIVE METABOLIC PANEL
ALT: 15 U/L (ref 0–53)
AST: 18 U/L (ref 0–37)
Albumin: 3.8 g/dL (ref 3.5–5.2)
Alkaline Phosphatase: 80 U/L (ref 39–117)
BUN: 12 mg/dL (ref 6–23)
CO2: 26 mEq/L (ref 19–32)
Calcium: 9.9 mg/dL (ref 8.4–10.5)
Chloride: 99 mEq/L (ref 96–112)
Creatinine, Ser: 1.44 mg/dL (ref 0.40–1.50)
GFR: 45.97 mL/min — ABNORMAL LOW (ref >60.00)
Glucose, Bld: 249 mg/dL — ABNORMAL HIGH (ref 70–99)
Potassium: 4.5 mEq/L (ref 3.5–5.1)
Sodium: 137 mEq/L (ref 135–145)
Total Bilirubin: 1 mg/dL (ref 0.2–1.2)
Total Protein: 7.2 g/dL (ref 6.0–8.3)

## 2021-02-20 LAB — CBC WITH DIFFERENTIAL/PLATELET
Basophils Absolute: 0.1 10*3/uL (ref 0.0–0.1)
Basophils Relative: 1 % (ref 0.0–3.0)
Eosinophils Absolute: 0.2 10*3/uL (ref 0.0–0.7)
Eosinophils Relative: 2.2 % (ref 0.0–5.0)
HCT: 48.7 % (ref 39.0–52.0)
Hemoglobin: 16.1 g/dL (ref 13.0–17.0)
Lymphocytes Relative: 21.8 % (ref 12.0–46.0)
Lymphs Abs: 2.1 10*3/uL (ref 0.7–4.0)
MCHC: 33 g/dL (ref 30.0–36.0)
MCV: 84.3 fl (ref 78.0–100.0)
Monocytes Absolute: 1 10*3/uL (ref 0.1–1.0)
Monocytes Relative: 10.5 % (ref 3.0–12.0)
Neutro Abs: 6.3 10*3/uL (ref 1.4–7.7)
Neutrophils Relative %: 64.5 % (ref 43.0–77.0)
Platelets: 285 10*3/uL (ref 150.0–400.0)
RBC: 5.78 Mil/uL (ref 4.22–5.81)
RDW: 14 % (ref 11.5–15.5)
WBC: 9.7 10*3/uL (ref 4.0–10.5)

## 2021-02-20 LAB — LIPID PANEL
Cholesterol: 201 mg/dL — ABNORMAL HIGH (ref 0–200)
HDL: 53.6 mg/dL (ref >39.00)
LDL Cholesterol: 114 mg/dL — ABNORMAL HIGH (ref 0–99)
NonHDL: 147.14
Total CHOL/HDL Ratio: 4
Triglycerides: 166 mg/dL — ABNORMAL HIGH (ref 0.0–149.0)
VLDL: 33.2 mg/dL (ref 0.0–40.0)

## 2021-02-20 LAB — PSA: PSA: 0.01 ng/mL — ABNORMAL LOW (ref 0.10–4.00)

## 2021-02-20 LAB — HEMOGLOBIN A1C: Hgb A1c MFr Bld: 9.2 % — ABNORMAL HIGH (ref 4.6–6.5)

## 2021-02-20 NOTE — Progress Notes (Signed)
Andre Stout is a 81 y.o. male with the following history as recorded in EpicCare:  Patient Active Problem List   Diagnosis Date Noted  . Syncope and collapse 10/09/2020  . Prostate cancer ([place]) 03/25/2019  . Diabetes ([place]) 03/28/2016  . Hypophosphatemia 03/06/2016  . Hypomagnesemia 03/06/2016  . Influenza A 03/05/2016  . Benign essential HTN 03/05/2016  . AKI (acute kidney injury) ([place]) 03/04/2016  . Routine general medical examination at a health care facility 05/02/2015  . Medicare annual wellness visit, subsequent 05/02/2015    Current Outpatient Medications  Medication Sig Dispense Refill  . ALPHAGAN P 0.15 % ophthalmic solution Place 1 drop into the left eye 2 (two) times daily.     . Cholecalciferol (VITAMIN D3) 2000 UNITS TABS Take 1 tablet by mouth daily.     Marland Kitchen Cod Liver Oil 1000 MG CAPS Take 1 capsule by mouth daily.     . COMBIGAN 0.2-0.5 % ophthalmic solution Place 1 drop into both eyes 2 (two) times daily.  12  . dorzolamide (TRUSOPT) 2 % ophthalmic solution Place 1 drop into both eyes daily.     . insulin NPH-regular Human (HUMULIN 70/30) (70-30) 100 UNIT/ML injection Inject 35 Units into the skin 2 (two) times daily with a meal. 35 units with breakfast, and 25 with supper 30 mL 11  . Insulin Syringe-Needle U-100 (BD INSULIN SYRINGE U/F) 31G X 5/16" 1 ML MISC USE 2 DAILY 200 each 4  . Misc Natural Products (URINOZINC PO) Take 1 tablet by mouth daily.     . Multiple Vitamins-Minerals (CENTRUM ADULTS PO) Take 1 tablet by mouth daily.     . Omega 3 1200 MG CAPS Take 1 capsule by mouth daily.     Glory Rosebush DELICA LANCETS 94W MISC Use to check blood sugar 4 times per day. Dx code: E11.9 200 each 2  . ONETOUCH ULTRA test strip USE TO MONITOR GLUCOSE LEVELS 4 TIMES PER DAY E11.9 100 each 2  . PRESCRIPTION MEDICATION Gets injections in eyes at eye dr's office     No current facility-administered medications for this visit.    Allergies: Patient has no known allergies.   Past Medical History:  Diagnosis Date  . Diabetes mellitus without complication ([place])   . Glaucoma   . Prostate cancer ([place])    Been 3-4 years ago    Past Surgical History:  Procedure Laterality Date  . CATARACT EXTRACTION Bilateral     Family History  Problem Relation Age of Onset  . Diabetes Father   . Diabetes Paternal Grandfather     Social History   Tobacco Use  . Smoking status: Former Smoker    Packs/day: 0.50    Years: 4.00    Pack years: 2.00    Types: Cigarettes    Quit date: 10/29/1974    Years since quitting: 46.3  . Smokeless tobacco: Never Used  Substance Use Topics  . Alcohol use: No    Subjective:  Patient has not been seen in our office since May 2020; he sees his endocrinologist regularly for management of his Type 1 Diabetes;  He had a syncopal episode in October 2021 and was hospitalized- felt to be due to low blood sugar; home health was ordered and received after he was discharged. Our office repeatedly reached out to patient in November to try and get his scheduled within 30 days after discharge to follow Medicare guidelines; unfortunately, he did not follow up before now and is requesting home health  forms to be signed as requested if the agency sends them back again for review.   Is seeing his eye doctor later today; is agreeable to labs- notes he does not want to take any type of cholesterol medication; history of prostate cancer- not seeing urology regularly for follow up;   Objective:  Vitals:   02/20/21 1033  BP: 118/64  Pulse: 94  Resp: 18  Temp: 97.6 F (36.4 C)  TempSrc: Oral  SpO2: 94%  Weight: 188 lb 6.4 oz (85.5 kg)  Height: 5' 4"  (1.626 m)    General: Well developed, well nourished, in no acute distress  Skin : Warm and dry.  Head: Normocephalic and atraumatic  Eyes: Sclera and conjunctiva clear; pupils round and reactive to light; extraocular movements intact  Ears: External normal; canals clear; tympanic membranes normal   Oropharynx: Pink, supple. No suspicious lesions  Neck: Supple without thyromegaly, adenopathy  Lungs: Respirations unlabored; clear to auscultation bilaterally without wheeze, rales, rhonchi  CVS exam: normal rate and regular rhythm.  Neurologic: Alert and oriented; speech intact; face symmetrical; moves all extremities well; CNII-XII intact without focal deficit   Assessment:  1. Type 1 diabetes mellitus with hyperglycemia (HCC)   2. Prostate cancer ([place])   3. Syncope and collapse     Plan:  1. Continue with endocrine as scheduled; check lipid panel today- patient has stated he does not want a statin;  2. Check PSA today; 3. Episode occurred in October 2021 due to low blood sugar. He did not follow-up as requested for home health paperwork management until now. Explained that I do not know if Medicare will be able to cover for him; will complete paperwork if agency requests again.  Time spent with patient 30 minutes including reviewing health needs/ discussing management of home health care issues  No follow-ups on file.  Orders Placed This Encounter  Procedures  . CBC with Differential/Platelet    Standing Status:   Future    Number of Occurrences:   1    Standing Expiration Date:   02/20/2022  . Comp Met (CMET)    Standing Status:   Future    Number of Occurrences:   1    Standing Expiration Date:   02/20/2022  . Hemoglobin A1c    Standing Status:   Future    Number of Occurrences:   1    Standing Expiration Date:   02/20/2022  . Lipid panel    Standing Status:   Future    Number of Occurrences:   1    Standing Expiration Date:   02/20/2022  . PSA    Standing Status:   Future    Number of Occurrences:   1    Standing Expiration Date:   02/20/2022    Requested Prescriptions    No prescriptions requested or ordered in this encounter

## 2021-02-27 ENCOUNTER — Encounter (INDEPENDENT_AMBULATORY_CARE_PROVIDER_SITE_OTHER): Payer: Medicare Other | Admitting: Ophthalmology

## 2021-02-27 ENCOUNTER — Other Ambulatory Visit: Payer: Self-pay

## 2021-02-27 DIAGNOSIS — H35033 Hypertensive retinopathy, bilateral: Secondary | ICD-10-CM | POA: Diagnosis not present

## 2021-02-27 DIAGNOSIS — E113593 Type 2 diabetes mellitus with proliferative diabetic retinopathy without macular edema, bilateral: Secondary | ICD-10-CM

## 2021-02-27 DIAGNOSIS — I10 Essential (primary) hypertension: Secondary | ICD-10-CM

## 2021-02-27 DIAGNOSIS — H43813 Vitreous degeneration, bilateral: Secondary | ICD-10-CM | POA: Diagnosis not present

## 2021-02-27 LAB — HM DIABETES EYE EXAM

## 2021-03-08 ENCOUNTER — Ambulatory Visit: Payer: Medicare Other | Admitting: Endocrinology

## 2021-03-17 ENCOUNTER — Other Ambulatory Visit: Payer: Self-pay

## 2021-03-17 ENCOUNTER — Ambulatory Visit (INDEPENDENT_AMBULATORY_CARE_PROVIDER_SITE_OTHER): Payer: Medicare Other | Admitting: Endocrinology

## 2021-03-17 VITALS — BP 142/78 | HR 87 | Ht 65.0 in | Wt 190.8 lb

## 2021-03-17 DIAGNOSIS — E1065 Type 1 diabetes mellitus with hyperglycemia: Secondary | ICD-10-CM

## 2021-03-17 LAB — POCT GLYCOSYLATED HEMOGLOBIN (HGB A1C): Hemoglobin A1C: 9.8 % — AB (ref 4.0–5.6)

## 2021-03-17 MED ORDER — HUMULIN 70/30 (70-30) 100 UNIT/ML ~~LOC~~ SUSP: 35.0000 [IU] | Freq: Two times a day (BID) | SUBCUTANEOUS | 11 refills | Status: DC

## 2021-03-17 NOTE — Patient Instructions (Addendum)
Your blood pressure is high today.  Please see your primary care provider soon, to have it rechecked check your blood sugar twice a day.  vary the time of day when you check, between before the 3 meals, and at bedtime.  also check if you have symptoms of your blood sugar being too high or too low.  please keep a record of the readings and bring it to your next appointment here.  You can write it on any piece of paper.  please call us sooner if your blood sugar goes below 70, or if you have a lot of readings over 200.  Please change the insulin to 35 units twice a day (with breakfast and with supper).   On this type of insulin schedule, you should eat meals on a regular schedule (especially lunch).  If a meal is missed or significantly delayed, your blood sugar could go low.   Also, it is critically important to take this insulin a meal.  Please come back for a follow-up appointment in 2 months.

## 2021-03-17 NOTE — Progress Notes (Signed)
Subjective:    Patient ID: Andre Stout, male    DOB: 03-27-1940, 81 y.o.   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  DKA: once, in 2017.  Severe hypoglycemia: 2015 and 2021 Pancreatitis: never.   SDOH: He is not a pump candidate, due to not recording cbg's; he takes human insulin, due to cost.   Other: emphasizing basal insulin did not help; he did not tolerate pioglitizone (edema); he takes BID insulin, after poor results with multiple daily injections; he was changed to 70/30, due to pattern of cbg's.  Interval history: He takes 35 units with breakfast, and 25 units with supper.  no cbg record, but states cbg's vary from 69-280.  It is in general lowest in the afternoon, and highest fasting.  He seldom has hypoglycemia, and these episodes are mild.   Past Medical History:  Diagnosis Date  . Diabetes mellitus without complication ([place])   . Glaucoma   . Prostate cancer ([place])    Been 3-4 years ago    Past Surgical History:  Procedure Laterality Date  . CATARACT EXTRACTION Bilateral     Social History   Socioeconomic History  . Marital status: Widowed    Spouse name: Not on file  . Number of children: 1  . Years of education: 23  . Highest education level: Not on file  Occupational History  . Occupation: Retired  Tobacco Use  . Smoking status: Former Smoker    Packs/day: 0.50    Years: 4.00    Pack years: 2.00    Types: Cigarettes    Quit date: 10/29/1974    Years since quitting: 46.4  . Smokeless tobacco: Never Used  Vaping Use  . Vaping Use: Never used  Substance and Sexual Activity  . Alcohol use: No  . Drug use: No  . Sexual activity: Not Currently  Other Topics Concern  . Not on file  Social History Narrative   Born and raised in [place], [place]. Currently reside in a private residence by himself. Daughter lives in the area. No live. Fun: hunt and fish    Denies religious beliefs that would effect health care.    Social Determinants of Health   Financial Resource Strain: Not on file  Food Insecurity: Not on file  Transportation Needs: Not on file  Physical Activity: Not on file  Stress: Not on file  Social Connections: Not on file  Intimate Partner Violence: Not on file    Current Outpatient Medications on File Prior to Visit  Medication Sig Dispense Refill  . ALPHAGAN P 0.15 % ophthalmic solution Place 1 drop into the left eye 2 (two) times daily.     . Cholecalciferol (VITAMIN D3) 2000 UNITS TABS Take 1 tablet by mouth daily.     Marland Kitchen Cod Liver Oil 1000 MG CAPS Take 1 capsule by mouth daily.     . COMBIGAN 0.2-0.5 % ophthalmic solution Place 1 drop into both eyes 2 (two) times daily.  12  . dorzolamide (TRUSOPT) 2 % ophthalmic solution Place 1 drop into both eyes daily.     . Insulin Syringe-Needle U-100 (BD INSULIN SYRINGE U/F) 31G X 5/16" 1 ML MISC USE 2 DAILY 200 each 4  . Misc Natural Products (URINOZINC PO) Take 1 tablet by mouth daily.     . Multiple Vitamins-Minerals (CENTRUM ADULTS PO) Take 1 tablet by mouth daily.     Marland Kitchen  Omega 3 1200 MG CAPS Take 1 capsule by mouth daily.     Glory Rosebush DELICA LANCETS 66Y MISC Use to check blood sugar 4 times per day. Dx code: E11.9 200 each 2  . ONETOUCH ULTRA test strip USE TO MONITOR GLUCOSE LEVELS 4 TIMES PER DAY E11.9 100 each 2  . PRESCRIPTION MEDICATION Gets injections in eyes at eye dr's office     No current facility-administered medications on file prior to visit.    No Known Allergies  Family History  Problem Relation Age of Onset  . Diabetes Father   . Diabetes Paternal Grandfather     BP (!) 142/78 (BP Location: Right Arm, Patient Position: Sitting, Cuff Size: Normal)   Pulse 87   Ht 5\' 5"  (1.651 m)   Wt 190 lb 12.8 oz (86.5 kg)   SpO2 96%   BMI 31.75 kg/m    Review of Systems     Objective:   Physical Exam VITAL SIGNS:  See vs page GENERAL: no  distress Pulses: dorsalis pedis intact bilat.   MSK: no deformity of the feet CV: 1+ bilat leg edema Skin:  no ulcer on the feet.  normal color and temp on the feet. Neuro: sensation is intact to touch on the feet Ext: there is bilateral onychomycosis of the toenails.      Lab Results  Component Value Date   HGBA1C 9.2 (H) 02/20/2021       Assessment & Plan:  HTN: is noted today Type 1 DM, with CRI: uncertain etiology and prognosis   Patient Instructions  Your blood pressure is high today.  Please see your primary care provider soon, to have it rechecked check your blood sugar twice a day.  vary the time of day when you check, between before the 3 meals, and at bedtime.  also check if you have symptoms of your blood sugar being too high or too low.  please keep a record of the readings and bring it to your next appointment here.  You can write it on any piece of paper.  please call us sooner if your blood sugar goes below 70, or if you have a lot of readings over 200.  Please change the insulin to 35 units twice a day (with breakfast and with supper).   On this type of insulin schedule, you should eat meals on a regular schedule (especially lunch).  If a meal is missed or significantly delayed, your blood sugar could go low.   Also, it is critically important to take this insulin a meal.  Please come back for a follow-up appointment in 2 months.

## 2021-04-05 ENCOUNTER — Telehealth (INDEPENDENT_AMBULATORY_CARE_PROVIDER_SITE_OTHER): Payer: Medicare Other | Admitting: Family

## 2021-04-05 DIAGNOSIS — J019 Acute sinusitis, unspecified: Secondary | ICD-10-CM

## 2021-04-05 MED ORDER — DOXYCYCLINE HYCLATE 100 MG PO TABS: 100.0000 mg | ORAL_TABLET | Freq: Two times a day (BID) | ORAL | 0 refills | Status: DC

## 2021-04-05 NOTE — Progress Notes (Signed)
Andre Stout is a 81 y.o. male with the following history as recorded in EpicCare:  Patient Active Problem List   Diagnosis Date Noted  . Syncope and collapse 10/09/2020  . Prostate cancer ([employer]) 03/25/2019  . Diabetes ([place]) 03/28/2016  . Hypophosphatemia 03/06/2016  . Hypomagnesemia 03/06/2016  . Influenza A 03/05/2016  . Benign essential HTN 03/05/2016  . AKI (acute kidney injury) ([place]) 03/04/2016  . Routine general medical examination at a health care facility 05/02/2015  . Medicare annual wellness visit, subsequent 05/02/2015    Current Outpatient Medications  Medication Sig Dispense Refill  . doxycycline (VIBRA-TABS) 100 MG tablet Take 1 tablet (100 mg total) by mouth 2 (two) times daily. 20 tablet 0  . ALPHAGAN P 0.15 % ophthalmic solution Place 1 drop into the left eye 2 (two) times daily.     . Cholecalciferol (VITAMIN D3) 2000 UNITS TABS Take 1 tablet by mouth daily.     Marland Kitchen Cod Liver Oil 1000 MG CAPS Take 1 capsule by mouth daily.     . COMBIGAN 0.2-0.5 % ophthalmic solution Place 1 drop into both eyes 2 (two) times daily.  12  . dorzolamide (TRUSOPT) 2 % ophthalmic solution Place 1 drop into both eyes daily.     . insulin NPH-regular Human (HUMULIN 70/30) (70-30) 100 UNIT/ML injection Inject 35 Units into the skin 2 (two) times daily with a meal. 30 mL 11  . Insulin Syringe-Needle U-100 (BD INSULIN SYRINGE U/F) 31G X 5/16" 1 ML MISC USE 2 DAILY 200 each 4  . Misc Natural Products (URINOZINC PO) Take 1 tablet by mouth daily.     . Multiple Vitamins-Minerals (CENTRUM ADULTS PO) Take 1 tablet by mouth daily.     . Omega 3 1200 MG CAPS Take 1 capsule by mouth daily.     Glory Rosebush DELICA LANCETS 32R MISC Use to check blood sugar 4 times per day. Dx code: E11.9 200 each 2  . ONETOUCH ULTRA test strip USE TO MONITOR GLUCOSE LEVELS 4 TIMES PER DAY E11.9 100 each 2  . PRESCRIPTION MEDICATION Gets injections in eyes at eye dr's office     No current facility-administered medications  for this visit.    Allergies: Patient has no known allergies.  Past Medical History:  Diagnosis Date  . Diabetes mellitus without complication ([place])   . Glaucoma   . Prostate cancer ([place])    Been 3-4 years ago    Past Surgical History:  Procedure Laterality Date  . CATARACT EXTRACTION Bilateral     Family History  Problem Relation Age of Onset  . Diabetes Father   . Diabetes Paternal Grandfather     Social History   Tobacco Use  . Smoking status: Former Smoker    Packs/day: 0.50    Years: 4.00    Pack years: 2.00    Types: Cigarettes    Quit date: 10/29/1974    Years since quitting: 46.4  . Smokeless tobacco: Never Used  Substance Use Topics  . Alcohol use: No    Subjective:   I connected with [name] on 04/05/21 at  9:00 AM EDT by a video enabled telemedicine application and verified that I am speaking with the correct person using two identifiers.   I discussed the limitations of evaluation and management by telemedicine and the availability of in person appointments. The patient expressed understanding and agreed to proceed. Provider in office/ patient is at home; provider and patient are only 2 people on video  call.   3 week history of sore throat/ cough/ congestion; concerned for possible sinus infection; no chest pain or shortness of breath; using OTC cough/ cold medication with limited relief; does have seasonal allergies;     Objective:  There were no vitals filed for this visit.  General: Well developed, well nourished, in no acute distress  Head: Normocephalic and atraumatic  Lungs: Respirations unlabored;  Neurologic: Alert and oriented; speech intact; face symmetrical;   Assessment:  1. Acute sinusitis, recurrence not specified, unspecified location     Plan:  Rx for Doxycycline 100 mg bid x 10 days; continue OTC medication as needed; increase fluids,rest and follow-up worse, no better; will need in person visit if symptoms persist.     No  follow-ups on file.  No orders of the defined types were placed in this encounter.   Requested Prescriptions   Signed Prescriptions Disp Refills  . doxycycline (VIBRA-TABS) 100 MG tablet 20 tablet 0    Sig: Take 1 tablet (100 mg total) by mouth 2 (two) times daily.

## 2021-05-23 ENCOUNTER — Other Ambulatory Visit: Payer: Self-pay

## 2021-05-23 ENCOUNTER — Encounter: Payer: Self-pay | Admitting: Podiatry

## 2021-05-23 ENCOUNTER — Ambulatory Visit (INDEPENDENT_AMBULATORY_CARE_PROVIDER_SITE_OTHER): Payer: Medicare Other | Admitting: Podiatry

## 2021-05-23 DIAGNOSIS — E1065 Type 1 diabetes mellitus with hyperglycemia: Secondary | ICD-10-CM | POA: Diagnosis not present

## 2021-05-23 DIAGNOSIS — M79674 Pain in right toe(s): Secondary | ICD-10-CM

## 2021-05-23 DIAGNOSIS — M79675 Pain in left toe(s): Secondary | ICD-10-CM | POA: Diagnosis not present

## 2021-05-23 DIAGNOSIS — B351 Tinea unguium: Secondary | ICD-10-CM

## 2021-05-23 DIAGNOSIS — N179 Acute kidney failure, unspecified: Secondary | ICD-10-CM

## 2021-05-23 NOTE — Progress Notes (Signed)
This patient returns to my office for at risk foot care.  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  This patient is unable to cut nails himself since the patient cannot reach his nails.These nails are painful walking and wearing shoes.  This patient presents for at risk foot care today.  General Appearance  Alert, conversant and in no acute stress.  Vascular  Dorsalis pedis and posterior tibial  pulses are palpable  bilaterally.  Capillary return is within normal limits  bilaterally. Temperature is within normal limits  bilaterally.  Neurologic  Senn-Weinstein monofilament wire test within normal limits  bilaterally. Muscle power within normal limits bilaterally.  Nails Thick disfigured discolored nails with subungual debris  from hallux to fifth toes bilaterally. No evidence of bacterial infection or drainage bilaterally.  Orthopedic  No limitations of motion  feet .  No crepitus or effusions noted.  No bony pathology or digital deformities noted.  Skin  normotropic skin with no porokeratosis noted bilaterally.  No signs of infections or ulcers noted.     Onychomycosis  Pain in right toes  Pain in left toes  Consent was obtained for treatment procedures.   Mechanical debridement of nails 1-5  bilaterally performed with a nail nipper.  Filed with dremel without incident.    Return office visit     3 months                 Told patient to return for periodic foot care and evaluation due to potential at risk complications.   Mahmood Boehringer DPM   

## 2021-06-23 ENCOUNTER — Ambulatory Visit (INDEPENDENT_AMBULATORY_CARE_PROVIDER_SITE_OTHER): Payer: Medicare Other | Admitting: Endocrinology

## 2021-06-23 ENCOUNTER — Other Ambulatory Visit: Payer: Self-pay

## 2021-06-23 VITALS — BP 160/80 | HR 81 | Ht 65.0 in | Wt 196.2 lb

## 2021-06-23 DIAGNOSIS — E1065 Type 1 diabetes mellitus with hyperglycemia: Secondary | ICD-10-CM | POA: Diagnosis not present

## 2021-06-23 LAB — POCT GLYCOSYLATED HEMOGLOBIN (HGB A1C): Hemoglobin A1C: 9.4 % — AB (ref 4.0–5.6)

## 2021-06-23 MED ORDER — HUMULIN 70/30 (70-30) 100 UNIT/ML ~~LOC~~ SUSP: SUBCUTANEOUS | 11 refills | Status: DC

## 2021-06-23 NOTE — Patient Instructions (Addendum)
Your blood pressure is high today.  Please see your primary care provider soon, to have it rechecked check your blood sugar twice a day.  vary the time of day when you check, between before the 3 meals, and at bedtime.  also check if you have symptoms of your blood sugar being too high or too low.  please keep a record of the readings and bring it to your next appointment here.  You can write it on any piece of paper.  please call us sooner if your blood sugar goes below 70, or if you have a lot of readings over 200.  Please change the insulin to 32 units with breakfast and 38 units with supper.    On this type of insulin schedule, you should eat meals on a regular schedule (especially lunch).  If a meal is missed or significantly delayed, your blood sugar could go low.    Please come back for a follow-up appointment in 2 months.

## 2021-06-23 NOTE — Progress Notes (Signed)
Subjective:    Patient ID: Andre Stout, male    DOB: 1940-10-24, 81 y.o.   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  DKA: once, in 2017.  Severe hypoglycemia: 2015 and 2021 Pancreatitis: never.   SDOH: He is not a pump candidate, due to not recording cbg's; he takes human insulin, due to cost.   Other: emphasizing basal insulin did not help; he did not tolerate pioglitizone (edema); he takes BID insulin, after poor results with multiple daily injections; he was changed to 70/30, due to pattern of cbg's.   Interval history: pt says he never misses the insulin. no cbg record, but states cbg's vary from 70-300.  It is in general lowest in the afternoon, and highest fasting.   Past Medical History:  Diagnosis Date   Diabetes mellitus without complication ([place])    Glaucoma    Prostate cancer ([place])    Been 3-4 years ago    Past Surgical History:  Procedure Laterality Date   CATARACT EXTRACTION Bilateral     Social History   Socioeconomic History   Marital status: Widowed    Spouse name: Not on file   Number of children: 1   Years of education: 19   Highest education level: Not on file  Occupational History   Occupation: Retired  Tobacco Use   Smoking status: Former    Packs/day: 0.50    Years: 4.00    Pack years: 2.00    Types: Cigarettes    Quit date: 10/29/1974    Years since quitting: 46.6   Smokeless tobacco: Never  Vaping Use   Vaping Use: Never used  Substance and Sexual Activity   Alcohol use: No   Drug use: No   Sexual activity: Not Currently  Other Topics Concern   Not on file  Social History Narrative   Born and raised in [place], [place]. Currently reside in a private residence by himself. Daughter lives in the area. No live. Fun: hunt and fish   Denies religious beliefs that would effect health care.    Social Determinants of Health   Financial  Resource Strain: Not on file  Food Insecurity: Not on file  Transportation Needs: Not on file  Physical Activity: Not on file  Stress: Not on file  Social Connections: Not on file  Intimate Partner Violence: Not on file    Current Outpatient Medications on File Prior to Visit  Medication Sig Dispense Refill   ALPHAGAN P 0.15 % ophthalmic solution Place 1 drop into the left eye 2 (two) times daily.      Cholecalciferol (VITAMIN D3) 2000 UNITS TABS Take 1 tablet by mouth daily.      Cod Liver Oil 1000 MG CAPS Take 1 capsule by mouth daily.      COMBIGAN 0.2-0.5 % ophthalmic solution Place 1 drop into both eyes 2 (two) times daily.  12   dorzolamide (TRUSOPT) 2 % ophthalmic solution Place 1 drop into both eyes daily.      Insulin Syringe-Needle U-100 (BD INSULIN SYRINGE U/F) 31G X 5/16" 1 ML MISC USE 2 DAILY 200 each 4   Misc Natural Products (URINOZINC PO) Take 1 tablet by mouth daily.      Multiple Vitamins-Minerals (CENTRUM ADULTS PO) Take 1 tablet by mouth daily.      Omega 3 1200 MG CAPS Take 1 capsule by mouth daily.  ONETOUCH DELICA LANCETS 42A MISC Use to check blood sugar 4 times per day. Dx code: E11.9 200 each 2   ONETOUCH ULTRA test strip USE TO MONITOR GLUCOSE LEVELS 4 TIMES PER DAY E11.9 100 each 2   PRESCRIPTION MEDICATION Gets injections in eyes at eye dr's office     No current facility-administered medications on file prior to visit.    No Known Allergies  Family History  Problem Relation Age of Onset   Diabetes Father    Diabetes Paternal Grandfather     BP (!) 160/80 (BP Location: Right Arm, Patient Position: Sitting, Cuff Size: Large)   Pulse 81   Ht 5\' 5"  (1.651 m)   Wt 196 lb 3.2 oz (89 kg)   SpO2 96%   BMI 32.65 kg/m    Review of Systems He denies hypoglycemia.      Objective:   Physical Exam Pulses: dorsalis pedis intact bilat.   MSK: no deformity of the feet CV: 2+ bilat leg edema Skin:  no ulcer on the feet.  normal color and temp on the  feet. Neuro: sensation is intact to touch on the feet Ext: there is bilateral onychomycosis of the toenails.    Lab Results  Component Value Date   HGBA1C 9.4 (A) 06/23/2021       Assessment & Plan:  Type 1 DM: uncontrolled.   Patient Instructions  Your blood pressure is high today.  Please see your primary care provider soon, to have it rechecked check your blood sugar twice a day.  vary the time of day when you check, between before the 3 meals, and at bedtime.  also check if you have symptoms of your blood sugar being too high or too low.  please keep a record of the readings and bring it to your next appointment here.  You can write it on any piece of paper.  please call us sooner if your blood sugar goes below 70, or if you have a lot of readings over 200.  Please change the insulin to 32 units with breakfast and 38 units with supper.    On this type of insulin schedule, you should eat meals on a regular schedule (especially lunch).  If a meal is missed or significantly delayed, your blood sugar could go low.    Please come back for a follow-up appointment in 2 months.

## 2021-07-02 IMAGING — CT CT HEAD W/O CM
3 series · 16 of 47 positions shown, 19 images · non-contrast
Comparison: None.

CLINICAL DATA: Found down

EXAM:
CT HEAD WITHOUT CONTRAST
TECHNIQUE: Contiguous axial images were obtained from the base of the skull
through the vertex without intravenous contrast.

[Series 2: head wo · axial · 0.50mm/px · z∈[-92,+33]mm · 10 of 31 slices shown, 13 images]
[im 3/31  brain]
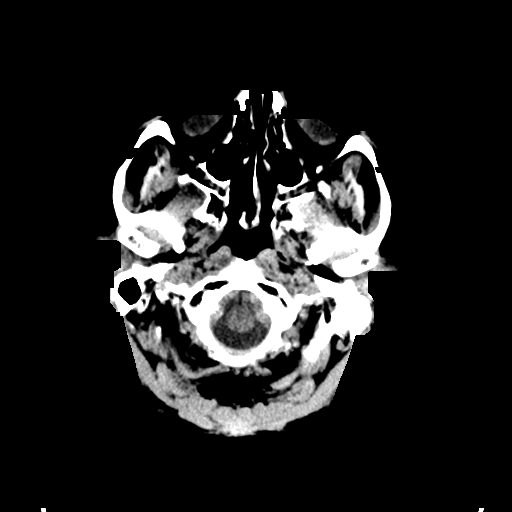
[im 3/31  bone]
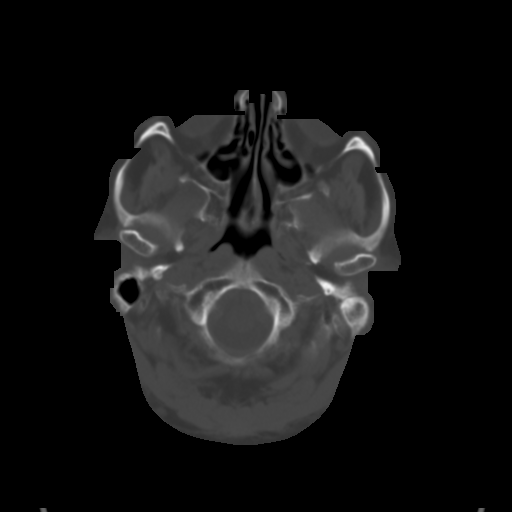
[im 6/31  brain]
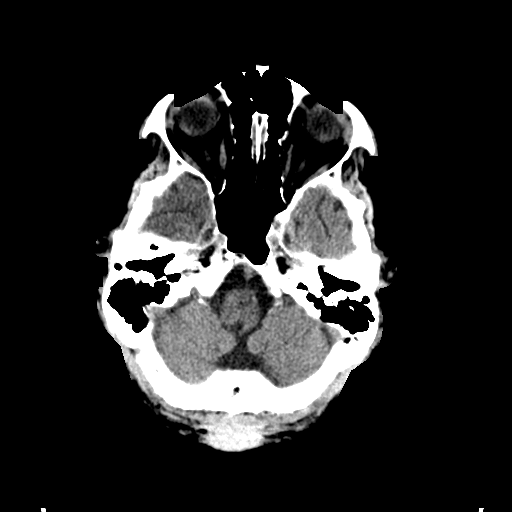
[im 9/31  brain]
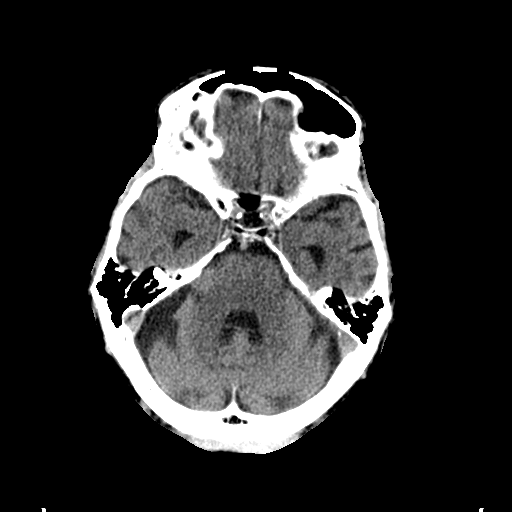
[im 11/31  brain]
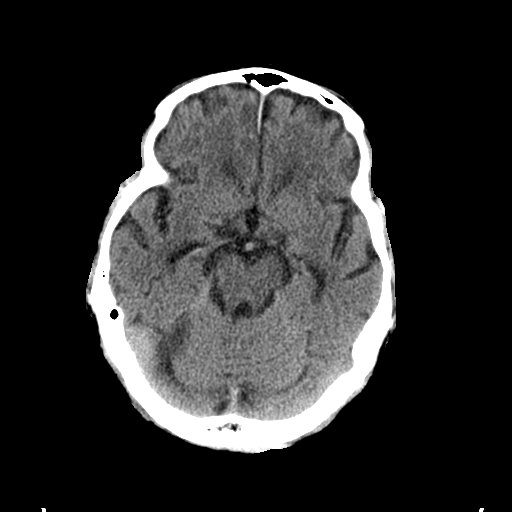
[im 14/31  brain]
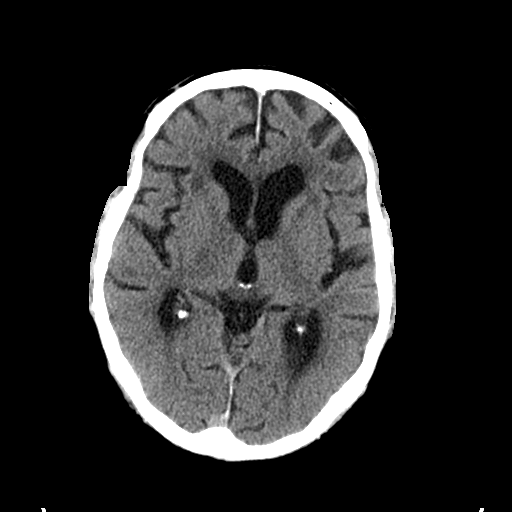
[im 14/31  bone]
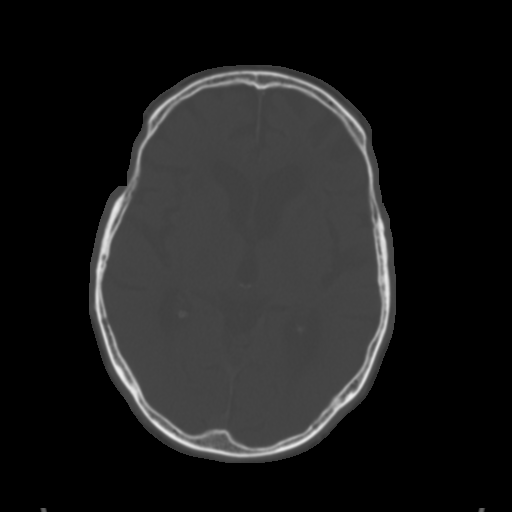
[im 17/31  brain]
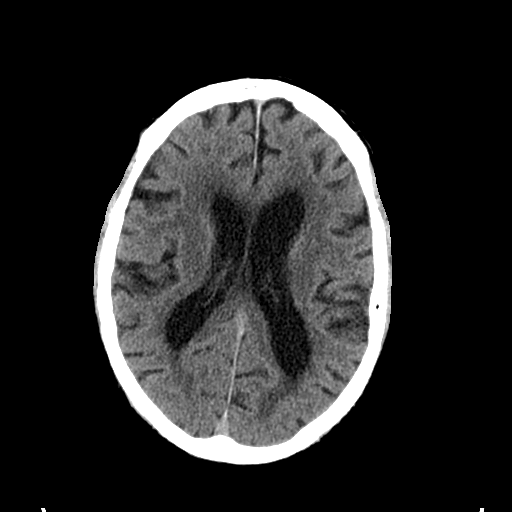
[im 20/31  brain]
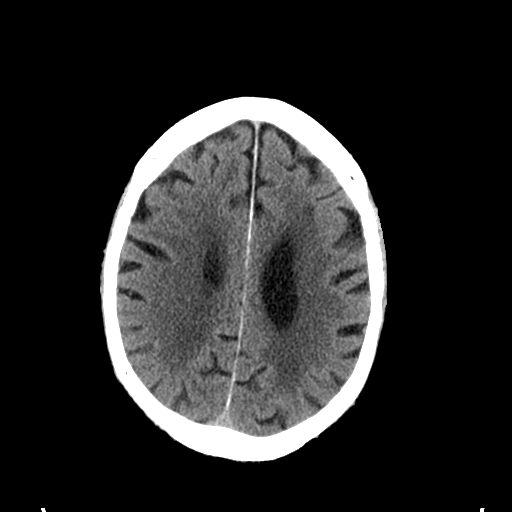
[im 23/31  brain]
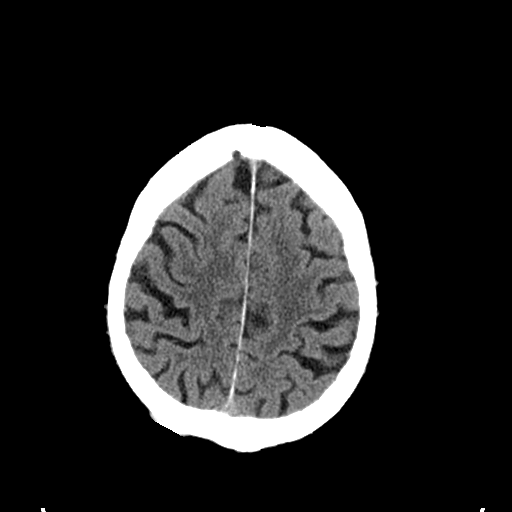
[im 25/31  brain]
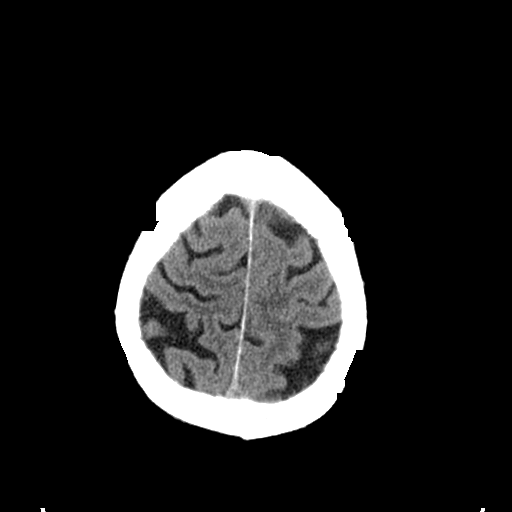
[im 25/31  bone]
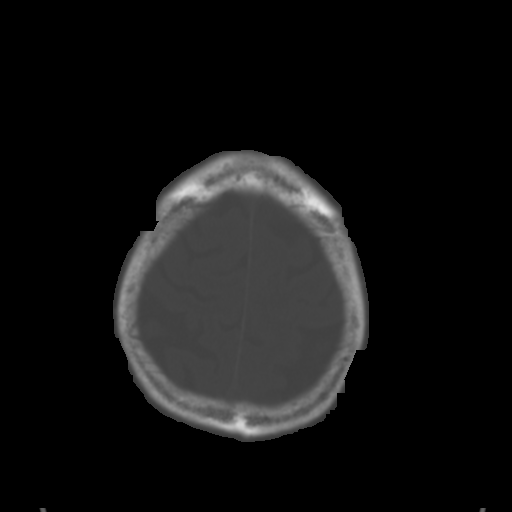
[im 28/31  brain]
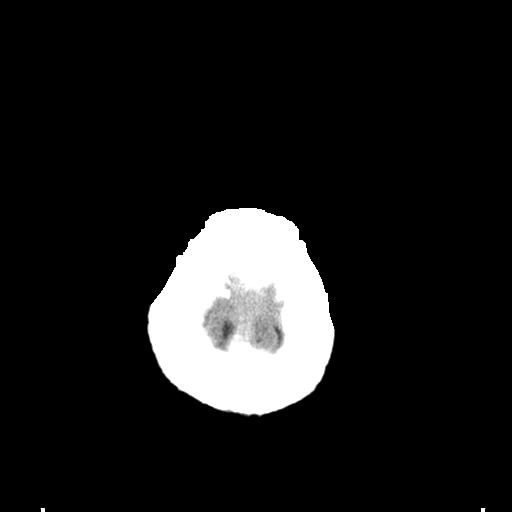

[Series 5: coronal soft tissue · coronal · 0.31mm/px · 3 of 71 slices shown]
[im 24/71  brain]
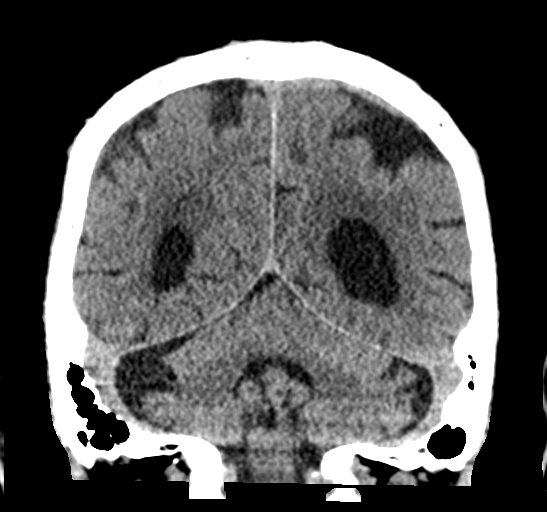
[im 32/71  brain]
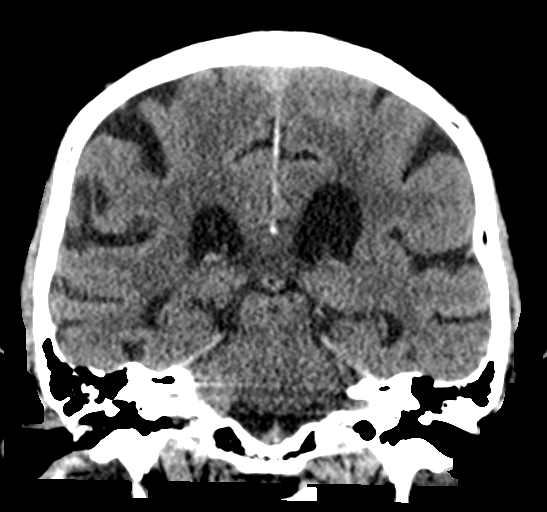
[im 39/71  brain]
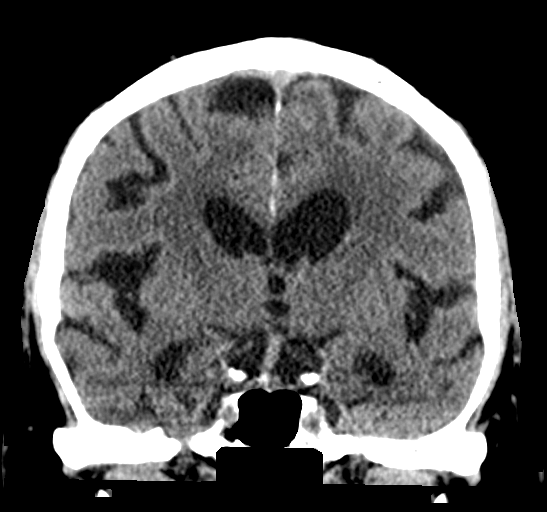

[Series 6: sagittal soft tissue · sagittal · 0.31mm/px · 3 of 58 slices shown]
[im 20/58  brain]
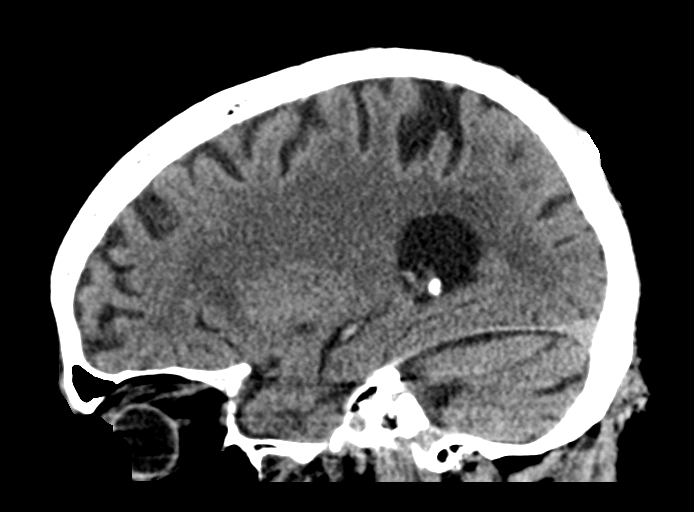
[im 29/58  brain]
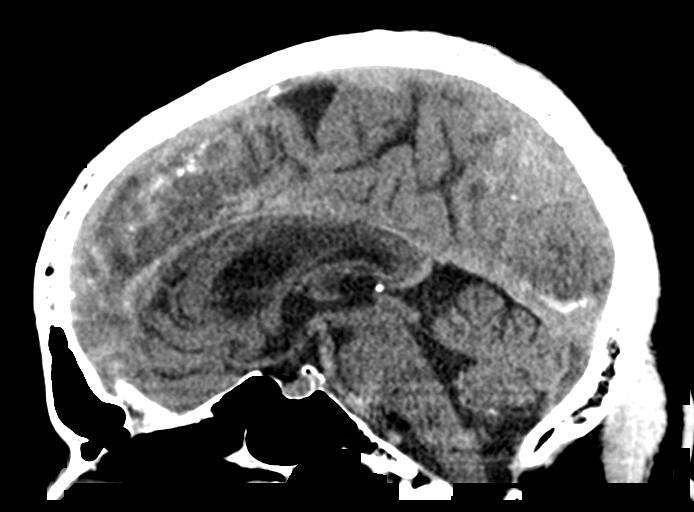
[im 39/58  brain]
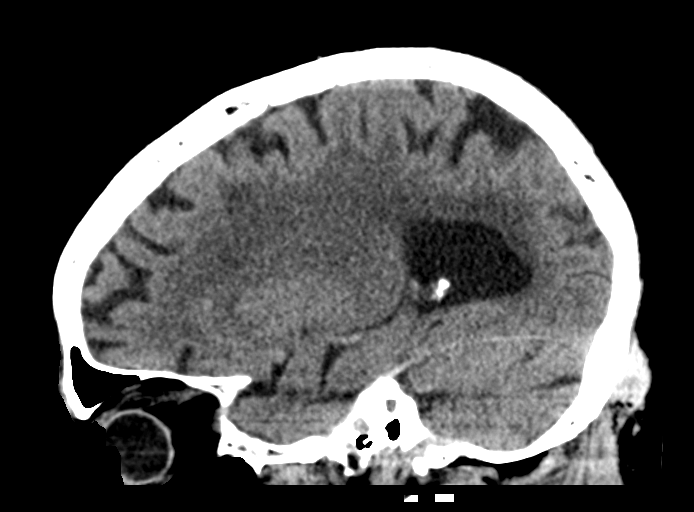

[16 of 47 positions shown; findings below may reference images not displayed]

FINDINGS: Brain: No evidence of acute infarction, hemorrhage, hydrocephalus,
extra-axial collection or mass lesion/mass effect. Periventricular
white matter hypodensities consistent with sequela of chronic
microvascular ischemic disease. Global parenchymal volume loss.

Vascular: No hyperdense vessel or unexpected calcification.

Skull: Normal. Negative for fracture or focal lesion.

Sinuses/Orbits: No acute finding.

Other: None.
IMPRESSION: No acute intracranial abnormality.

## 2021-07-02 IMAGING — CR DG KNEE COMPLETE 4+V*R*
4 series · 4 of 4 positions shown · non-contrast
Comparison: June 18, 2011

CLINICAL DATA: Status post fall with right knee pain.

EXAM:
RIGHT KNEE - COMPLETE 4+ VIEW

[x knee ap right (1 of 4)]
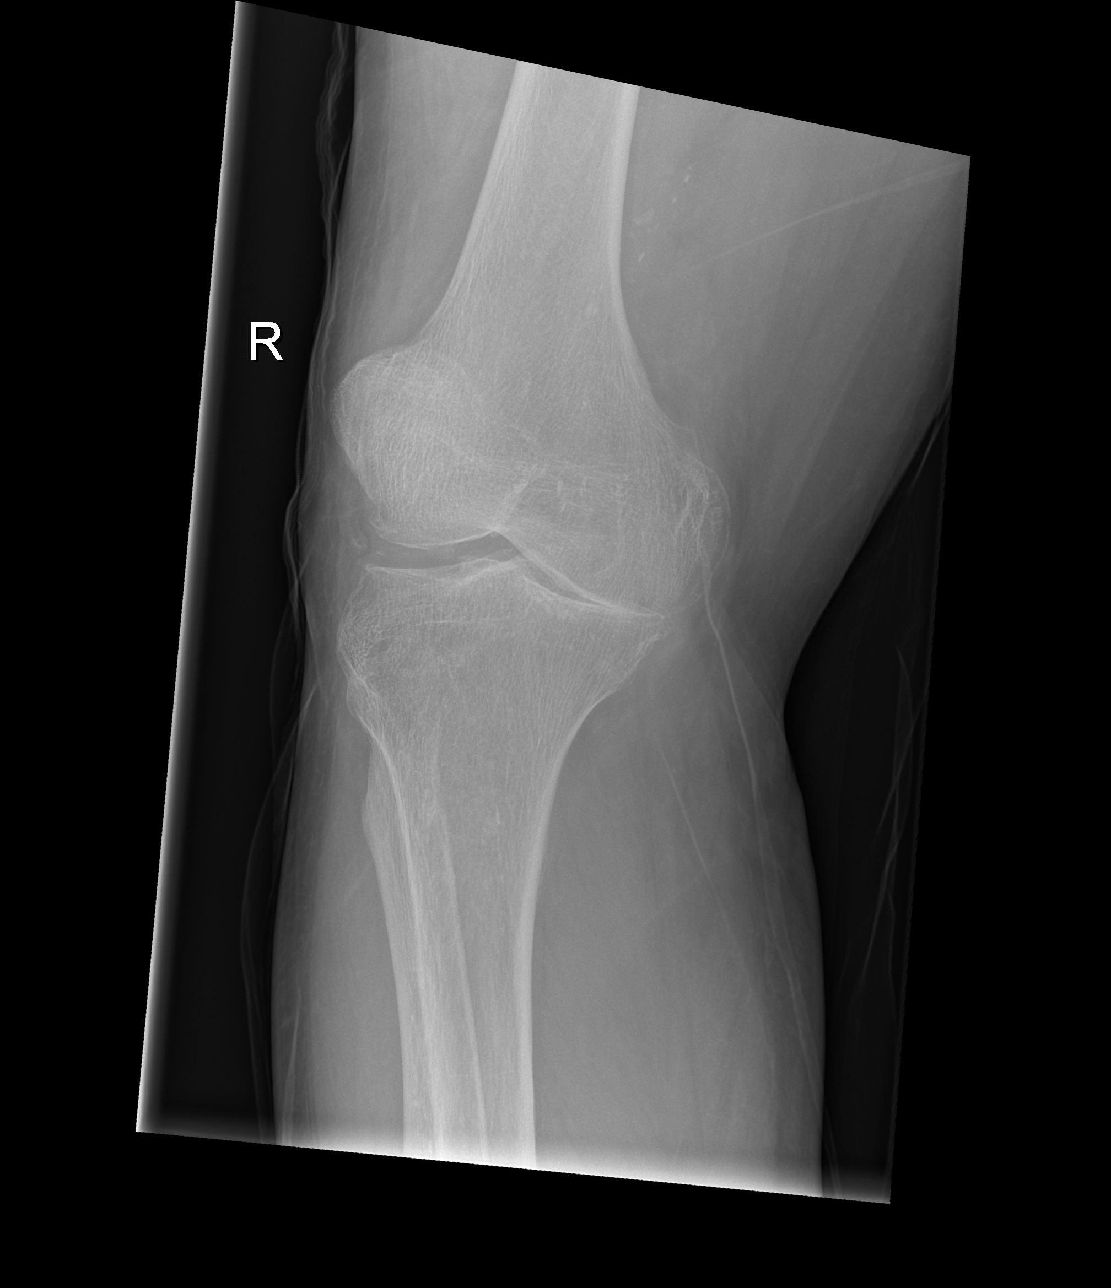

[x knee ap right (2 of 4)]
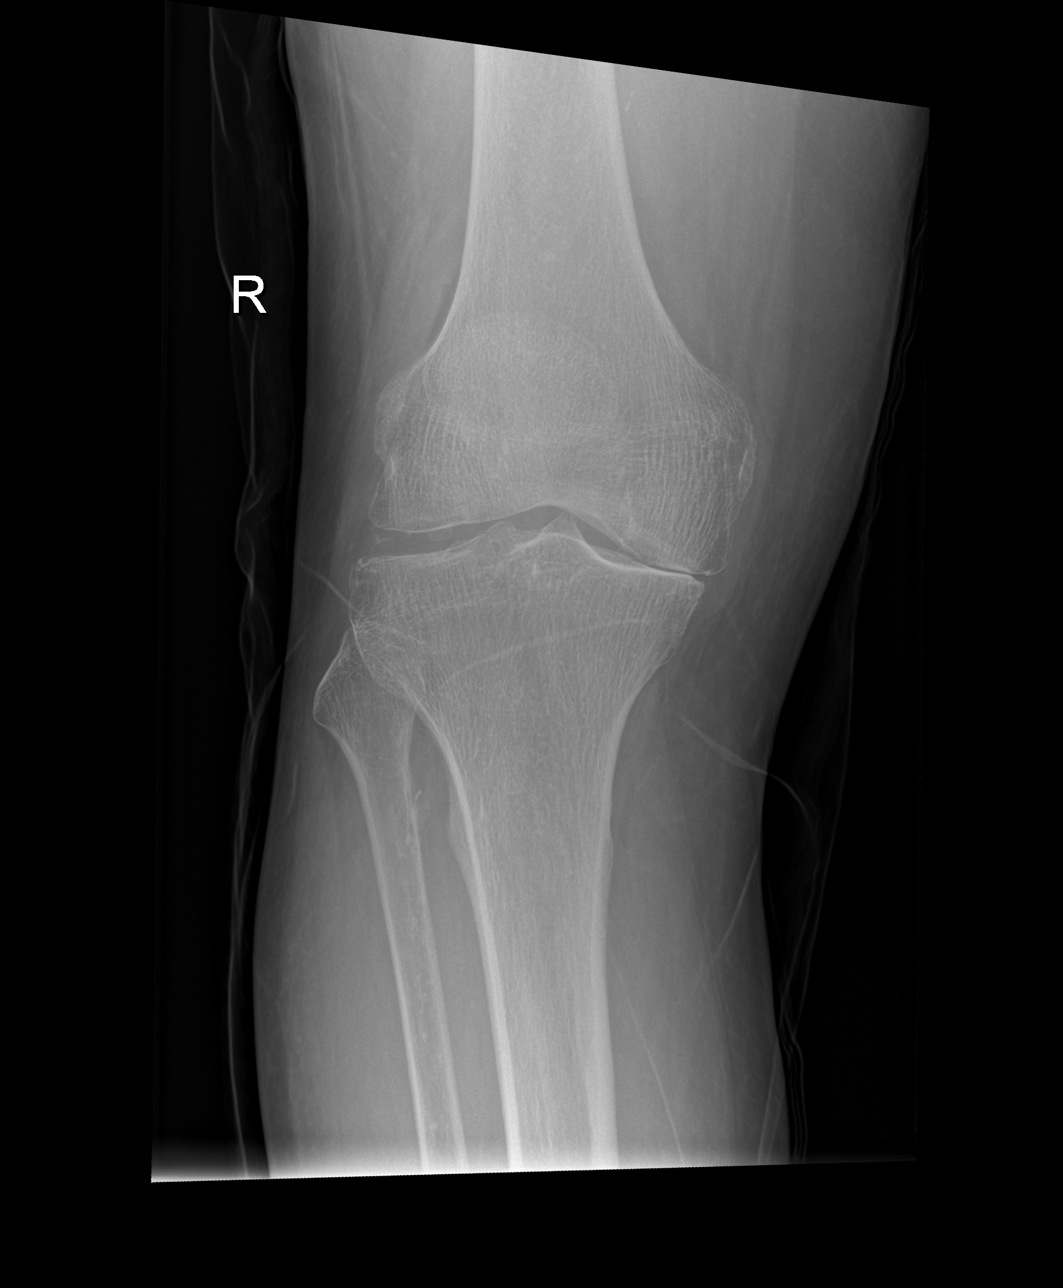

[x knee ap right (3 of 4)]
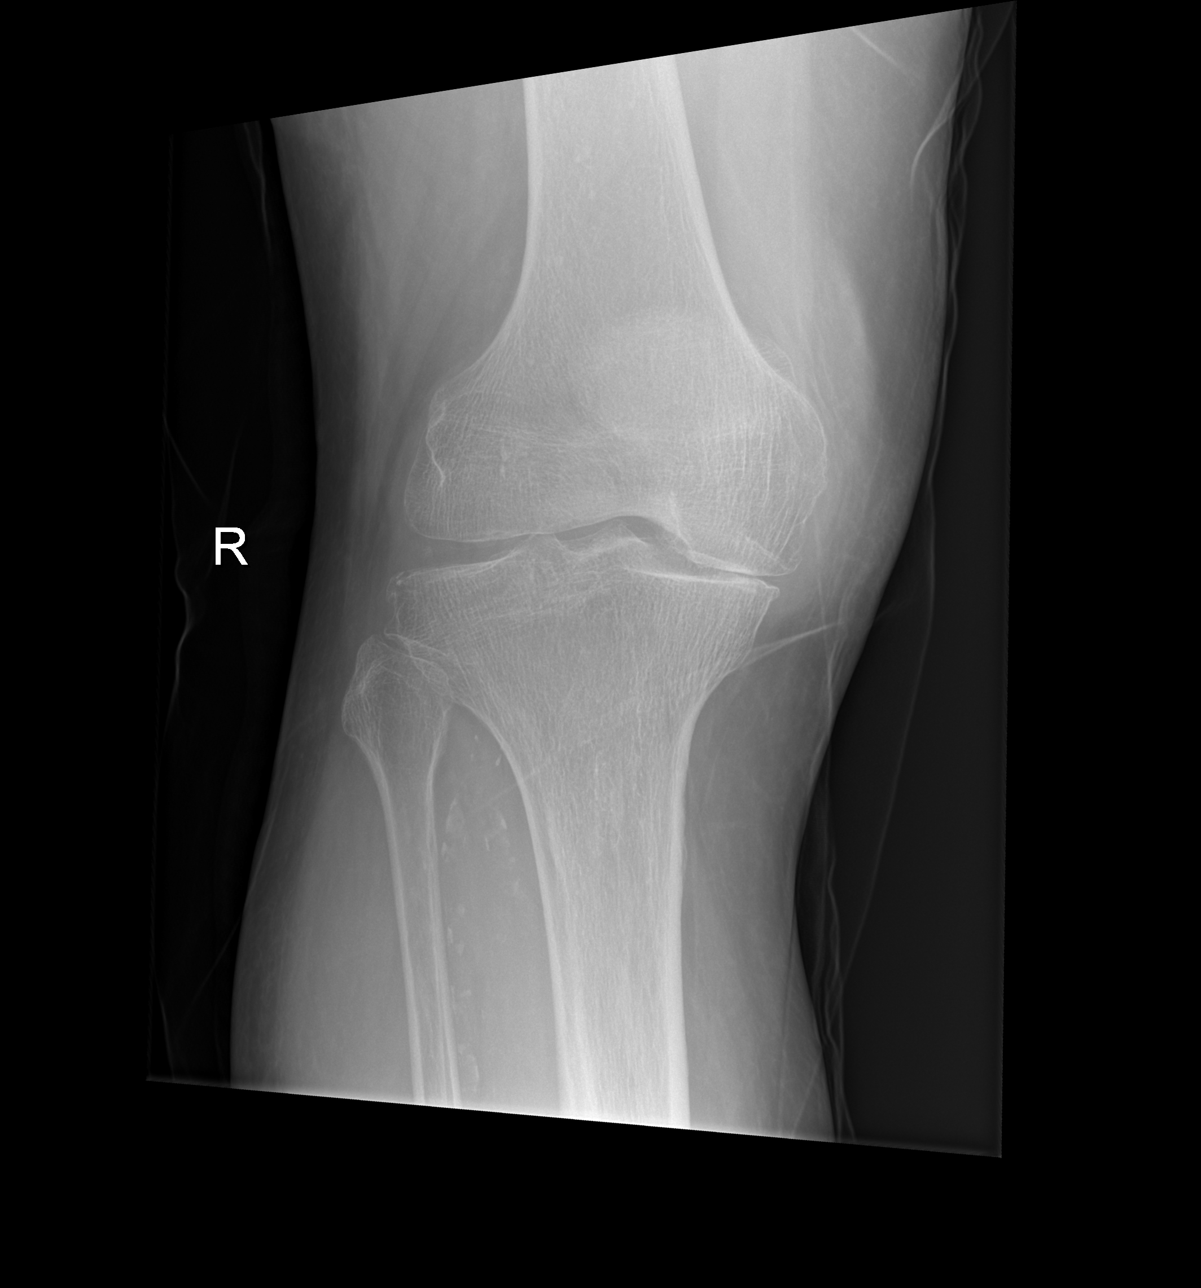

[x knee ap right (4 of 4)]
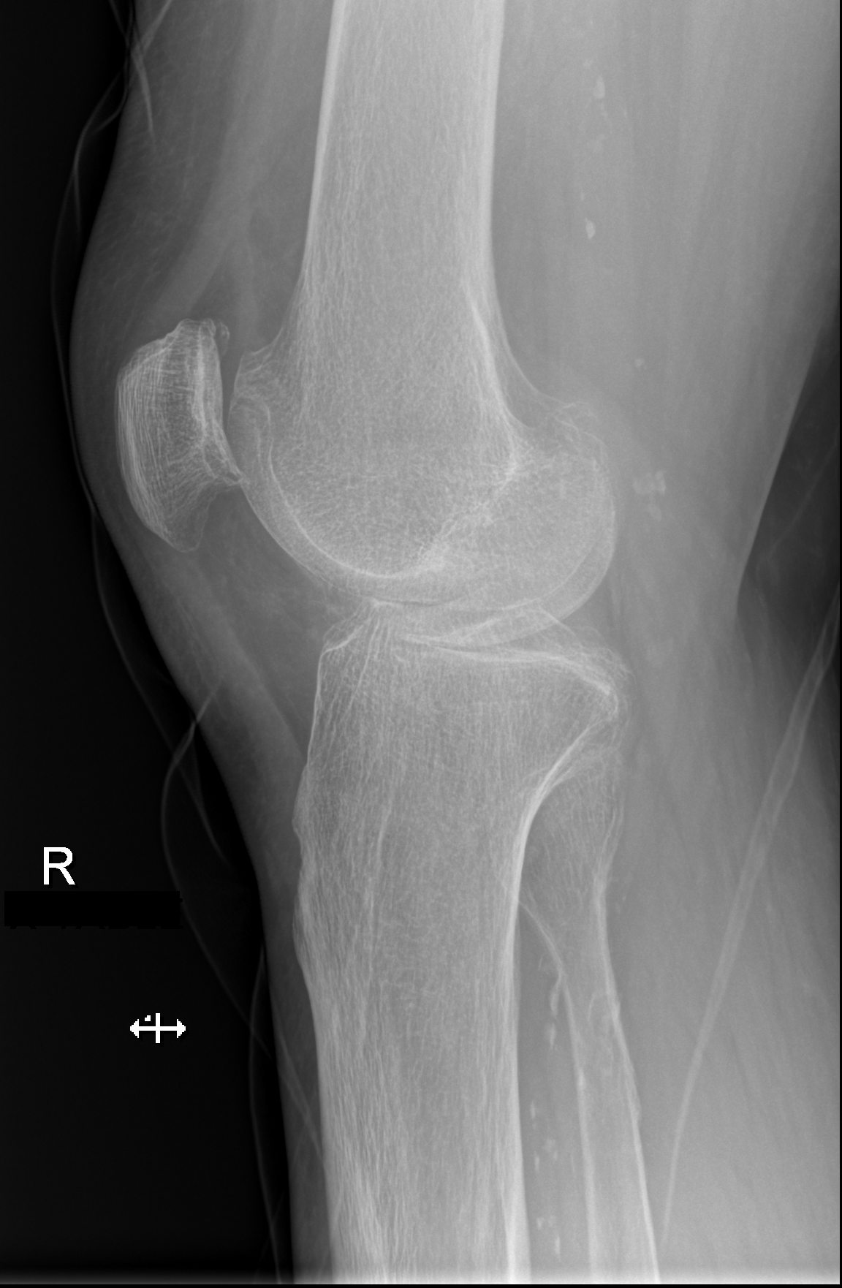

[4 of 4 positions shown; findings below may reference images not displayed]

FINDINGS: No evidence of fracture, dislocation, or joint effusion. Marked
narrowing medial femoral tibial joint space is noted. Soft tissues
are unremarkable.
IMPRESSION: No acute fracture or dislocation. Osteoarthritic changes of right
knee.

## 2021-07-02 IMAGING — CR DG CHEST 1V
1 series · 1 of 1 positions shown · non-contrast
Comparison: March 04, 2016

CLINICAL DATA: Possible loss of consciousness.

EXAM:
CHEST  1 VIEW

[x chest ap]
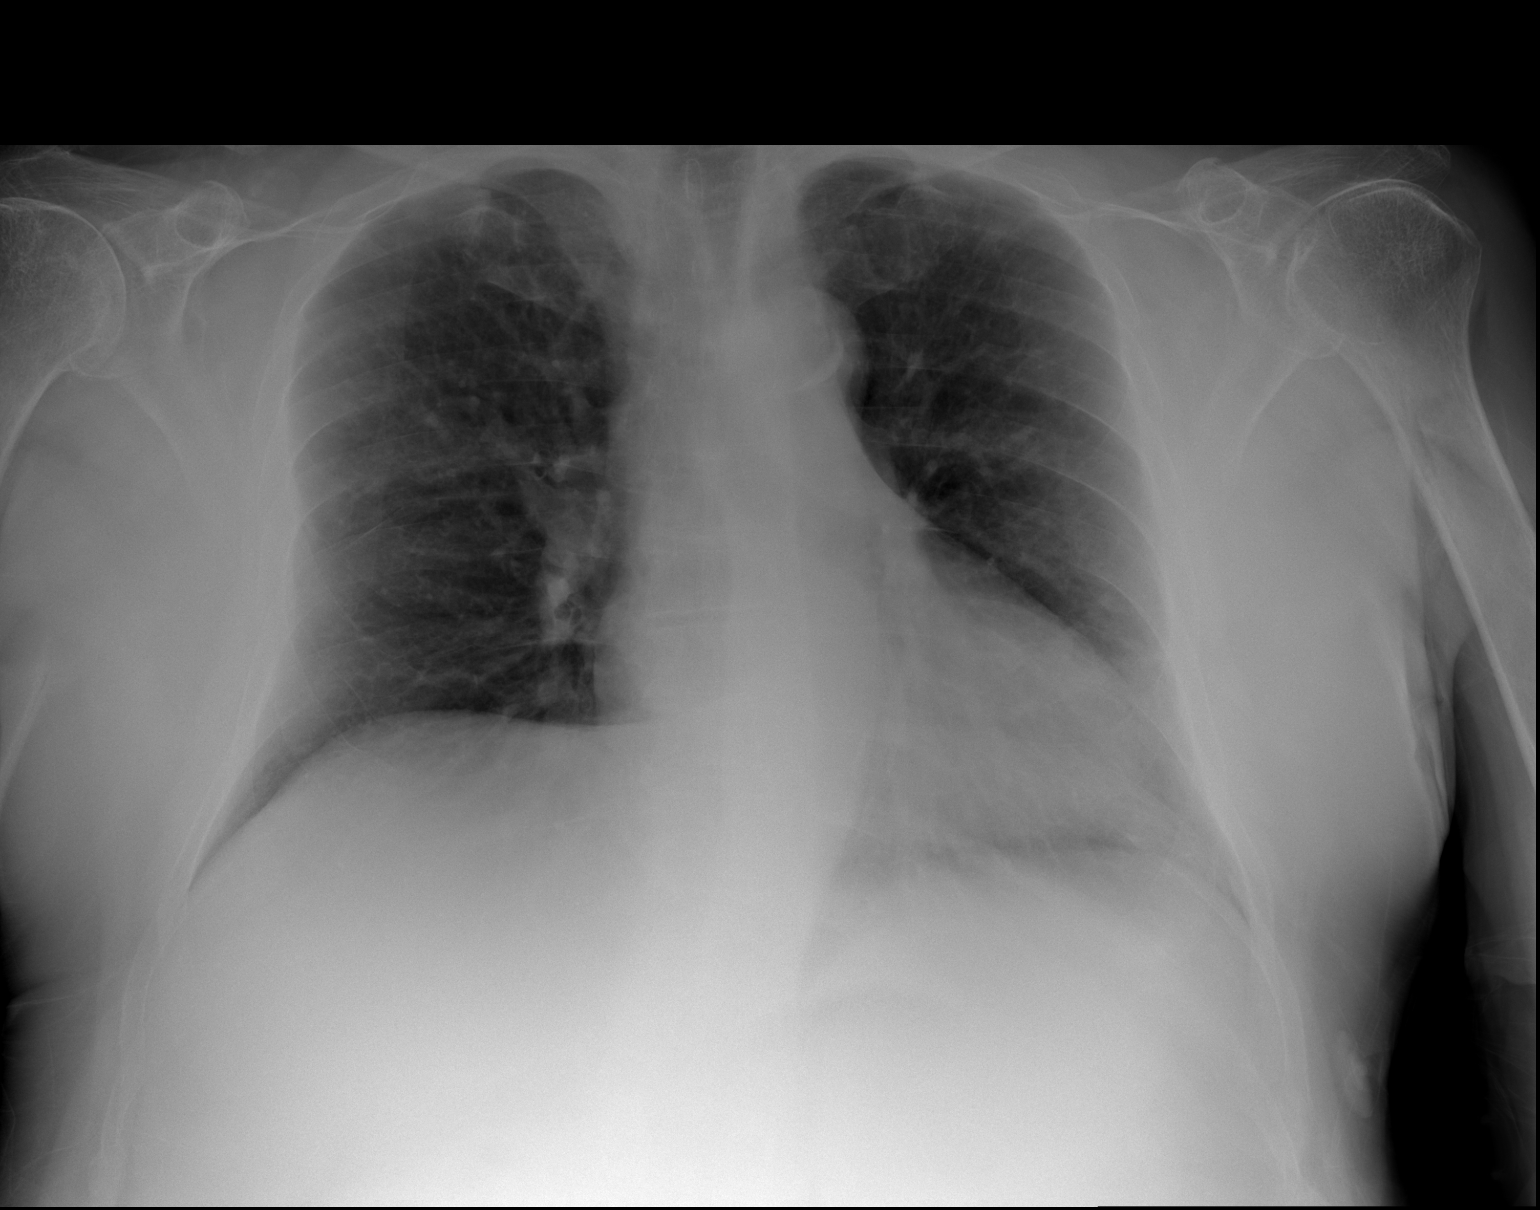

[1 of 1 positions shown; findings below may reference images not displayed]

FINDINGS: The heart size and mediastinal contours are within normal limits.
Chronic elevation of right hemidiaphragm is unchanged. Both lungs
are clear. The visualized skeletal structures are unremarkable.
IMPRESSION: No active disease.

## 2021-07-02 IMAGING — CR DG HIP (WITH OR WITHOUT PELVIS) 2-3V*R*
3 series · 3 of 3 positions shown · non-contrast
Comparison: None.

CLINICAL DATA: Low back and right hip pain.  Found down.

EXAM:
DG HIP (WITH OR WITHOUT PELVIS) 2-3V RIGHT

[t pelvis ap]
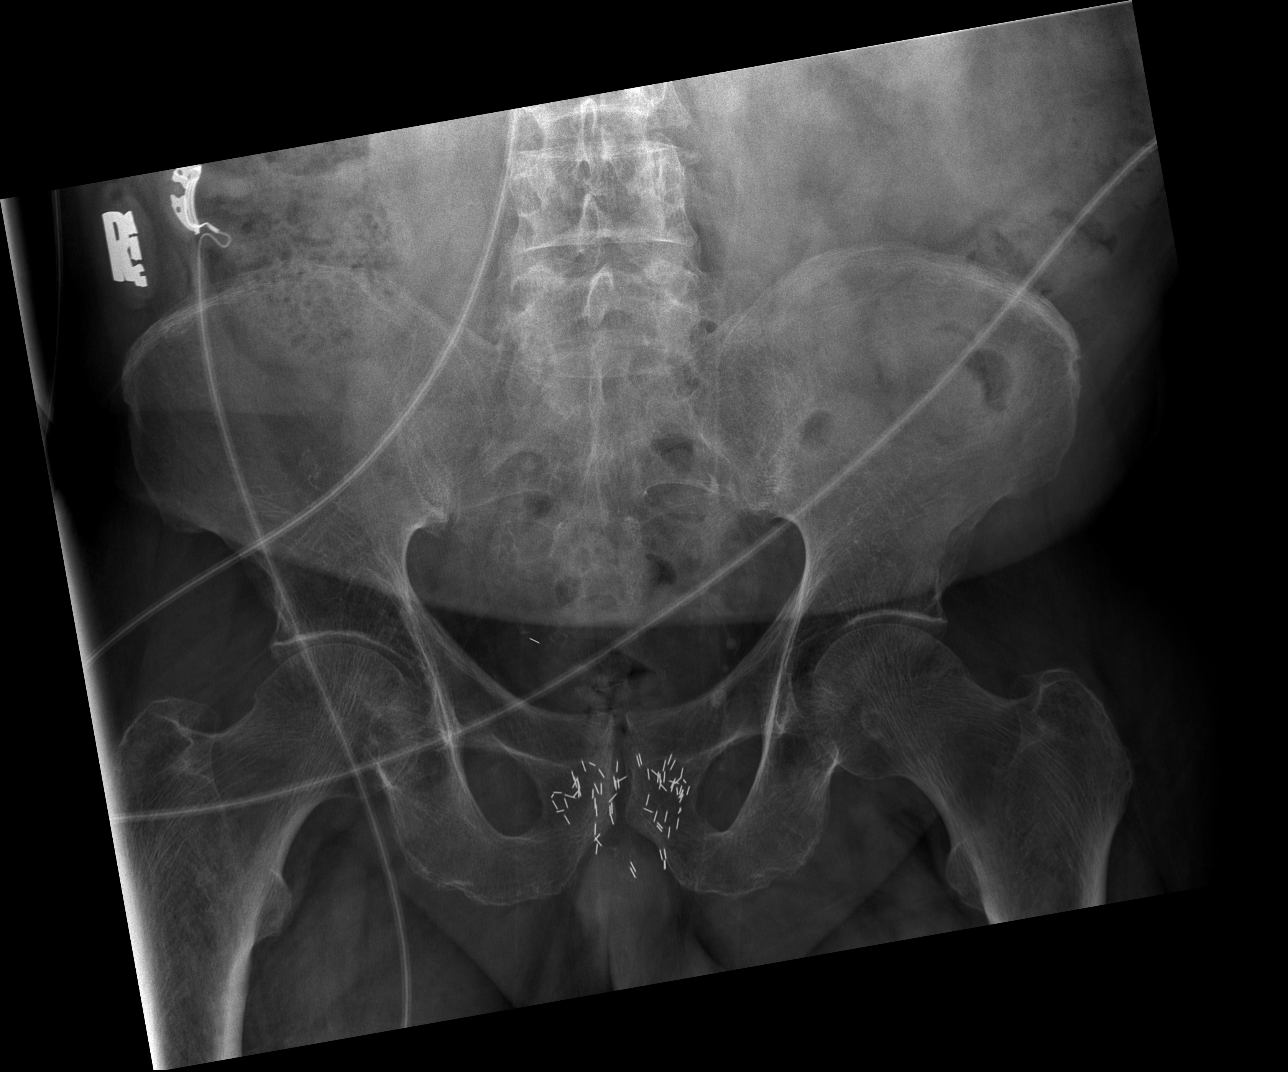

[t hip ap right]
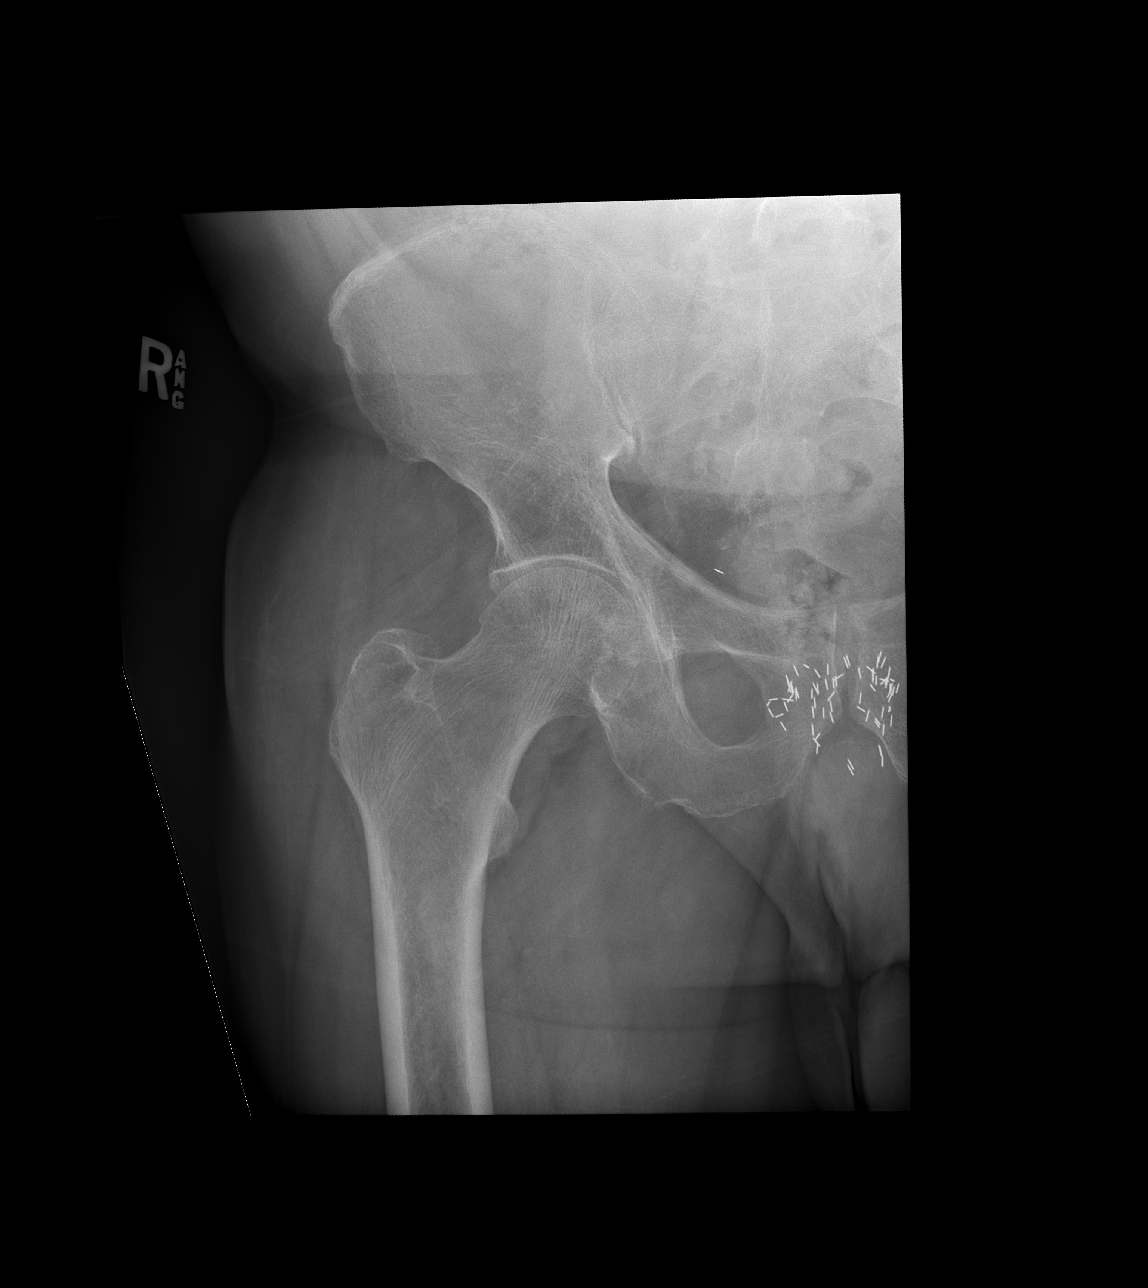

[t hip frog leg right]
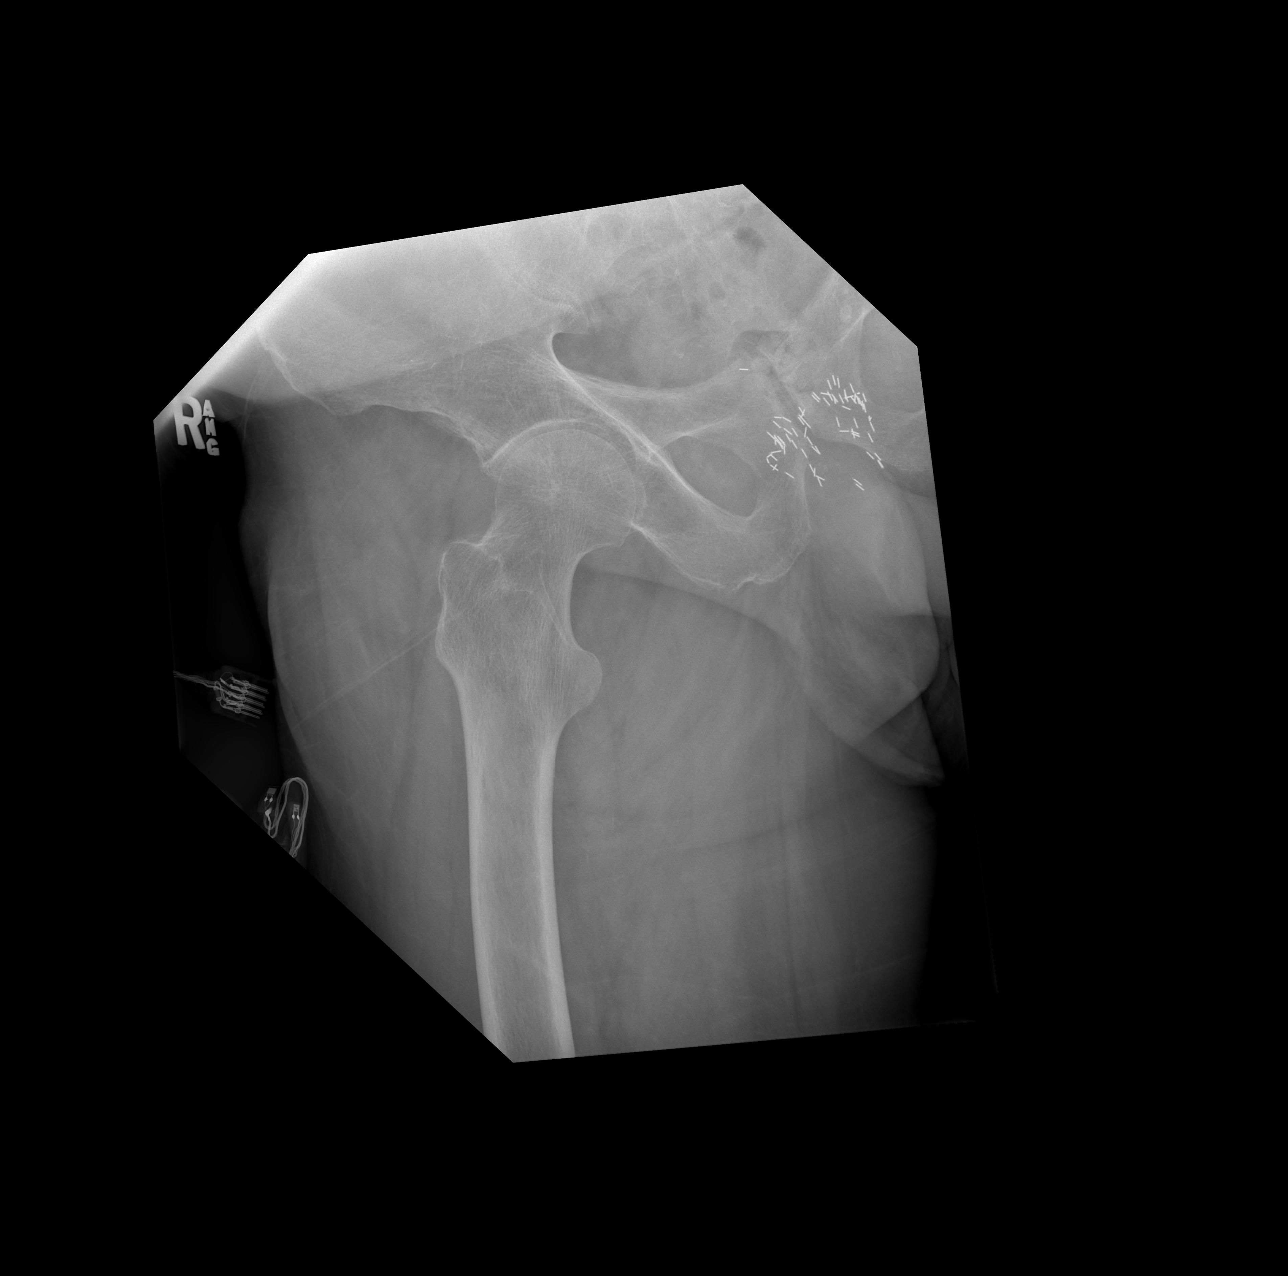

[3 of 3 positions shown; findings below may reference images not displayed]

FINDINGS: The bones appear demineralized. There is no evidence of acute
fracture, dislocation or femoral head avascular necrosis. There is
no evidence of osseous metastatic disease. Mild degenerative changes
are present at both hips. Prostate brachytherapy seeds are noted.
IMPRESSION: No evidence of acute fracture, dislocation or osseous metastatic
disease.

## 2021-07-18 ENCOUNTER — Telehealth: Payer: Self-pay | Admitting: Podiatry

## 2021-07-18 ENCOUNTER — Encounter: Payer: Self-pay | Admitting: Podiatry

## 2021-07-18 NOTE — Telephone Encounter (Signed)
Called patient to reschedule 8/24 appt unable to leave vm sent reschedule letter

## 2021-08-11 ENCOUNTER — Telehealth: Payer: Self-pay | Admitting: Family

## 2021-08-11 NOTE — Telephone Encounter (Signed)
Left message for patient to call me back at [phone] to schedule Medicare Annual Wellness Visit   Last AWV  05/28/19  Please schedule at anytime with [facility] if patient calls the office back.    42 Minutes appointment   Any questions, please call me at [phone]

## 2021-08-22 ENCOUNTER — Other Ambulatory Visit: Payer: Self-pay

## 2021-08-22 ENCOUNTER — Encounter: Payer: Self-pay | Admitting: Family

## 2021-08-22 ENCOUNTER — Telehealth (INDEPENDENT_AMBULATORY_CARE_PROVIDER_SITE_OTHER): Payer: Medicare Other | Admitting: Family

## 2021-08-22 VITALS — Ht 64.0 in | Wt 196.0 lb

## 2021-08-22 DIAGNOSIS — U071 COVID-19: Secondary | ICD-10-CM | POA: Diagnosis not present

## 2021-08-22 MED ORDER — BENZONATATE 100 MG PO CAPS: 100.0000 mg | ORAL_CAPSULE | Freq: Three times a day (TID) | ORAL | 0 refills | Status: DC | PRN

## 2021-08-22 MED ORDER — MOLNUPIRAVIR EUA 200MG CAPSULE: 4.0000 | ORAL_CAPSULE | Freq: Two times a day (BID) | ORAL | 0 refills | Status: AC

## 2021-08-22 NOTE — Progress Notes (Signed)
Andre Stout is a 81 y.o. male with the following history as recorded in EpicCare:  Patient Active Problem List   Diagnosis Date Noted   Syncope and collapse 10/09/2020   Prostate cancer ([place]) 03/25/2019   Diabetes ([place]) 03/28/2016   Hypophosphatemia 03/06/2016   Hypomagnesemia 03/06/2016   Influenza A 03/05/2016   Benign essential HTN 03/05/2016   AKI (acute kidney injury) ([place]) 03/04/2016   Routine general medical examination at a health care facility 05/02/2015   Medicare annual wellness visit, subsequent 05/02/2015    Current Outpatient Medications  Medication Sig Dispense Refill   ALPHAGAN P 0.15 % ophthalmic solution Place 1 drop into the left eye 2 (two) times daily.      benzonatate (TESSALON) 100 MG capsule Take 1 capsule (100 mg total) by mouth 3 (three) times daily as needed for cough. 30 capsule 0   Cholecalciferol (VITAMIN D3) 2000 UNITS TABS Take 1 tablet by mouth daily.      Cod Liver Oil 1000 MG CAPS Take 1 capsule by mouth daily.      COMBIGAN 0.2-0.5 % ophthalmic solution Place 1 drop into both eyes 2 (two) times daily.  12   dorzolamide (TRUSOPT) 2 % ophthalmic solution Place 1 drop into both eyes daily.      insulin NPH-regular Human (HUMULIN 70/30) (70-30) 100 UNIT/ML injection 32 units with breakfast, and 38 units with supper 30 mL 11   Insulin Syringe-Needle U-100 (BD INSULIN SYRINGE U/F) 31G X 5/16" 1 ML MISC USE 2 DAILY 200 each 4   Misc Natural Products (URINOZINC PO) Take 1 tablet by mouth daily.      molnupiravir EUA 200 mg CAPS Take 4 capsules (800 mg total) by mouth 2 (two) times daily for 5 days. 40 capsule 0   Multiple Vitamins-Minerals (CENTRUM ADULTS PO) Take 1 tablet by mouth daily.      Omega 3 1200 MG CAPS Take 1 capsule by mouth daily.      ONETOUCH DELICA LANCETS 99991111 MISC Use to check blood sugar 4 times per day. Dx code: E11.9 200 each 2   ONETOUCH ULTRA test strip USE TO MONITOR GLUCOSE LEVELS 4 TIMES PER DAY E11.9 100 each 2   PRESCRIPTION  MEDICATION Gets injections in eyes at eye dr's office     No current facility-administered medications for this visit.    Allergies: Patient has no known allergies.  Past Medical History:  Diagnosis Date   Diabetes mellitus without complication ([place])    Glaucoma    Prostate cancer ([place])    Been 3-4 years ago    Past Surgical History:  Procedure Laterality Date   CATARACT EXTRACTION Bilateral     Family History  Problem Relation Age of Onset   Diabetes Father    Diabetes Paternal Grandfather     Social History   Tobacco Use   Smoking status: Former    Packs/day: 0.50    Years: 4.00    Pack years: 2.00    Types: Cigarettes    Quit date: 10/29/1974    Years since quitting: 46.8   Smokeless tobacco: Never  Substance Use Topics   Alcohol use: No    Subjective:   I connected with Andre Stout on 08/22/21 at  3:40 PM EDT by a video enabled telemedicine application and verified that I am speaking with the correct person using two identifiers.   I discussed the limitations of evaluation and management by telemedicine and the availability of in person appointments. The  patient expressed understanding and agreed to proceed. Provider in office/ patient is at home; provider and patient are only 2 people on video call.   Tested positive for COVID yesterday; symptoms started with cough/ congestion over the weekend; no shortness of breath or chest pain; low grade fever; blood sugar is stable;       Objective:  Vitals:   08/22/21 1533  Weight: 196 lb (88.9 kg)  Height: '5\' 4"'$  (1.626 m)    General: Well developed, well nourished, in no acute distress  Head: Normocephalic and atraumatic  Lungs: Respirations unlabored;  Neurologic: Alert and oriented; speech intact; face symmetrical;   Assessment:  1. COVID-19     Plan:  Rx for Molnupiravir- use as directed; Rx for [employer]; increase fluids, rest and follow up worse, no better.   No follow-ups on file.  No orders  of the defined types were placed in this encounter.   Requested Prescriptions   Signed Prescriptions Disp Refills   molnupiravir EUA 200 mg CAPS 40 capsule 0    Sig: Take 4 capsules (800 mg total) by mouth 2 (two) times daily for 5 days.   benzonatate (TESSALON) 100 MG capsule 30 capsule 0    Sig: Take 1 capsule (100 mg total) by mouth 3 (three) times daily as needed for cough.

## 2021-08-23 ENCOUNTER — Ambulatory Visit: Payer: Medicare Other | Admitting: Podiatry

## 2021-08-24 ENCOUNTER — Other Ambulatory Visit: Payer: Self-pay | Admitting: Endocrinology

## 2021-08-28 ENCOUNTER — Encounter (INDEPENDENT_AMBULATORY_CARE_PROVIDER_SITE_OTHER): Payer: Medicare Other | Admitting: Ophthalmology

## 2021-09-06 ENCOUNTER — Encounter (INDEPENDENT_AMBULATORY_CARE_PROVIDER_SITE_OTHER): Payer: Medicare Other | Admitting: Ophthalmology

## 2021-09-06 ENCOUNTER — Other Ambulatory Visit: Payer: Self-pay

## 2021-09-06 DIAGNOSIS — H43813 Vitreous degeneration, bilateral: Secondary | ICD-10-CM | POA: Diagnosis not present

## 2021-09-06 DIAGNOSIS — E113593 Type 2 diabetes mellitus with proliferative diabetic retinopathy without macular edema, bilateral: Secondary | ICD-10-CM | POA: Diagnosis not present

## 2021-09-06 DIAGNOSIS — H35033 Hypertensive retinopathy, bilateral: Secondary | ICD-10-CM

## 2021-09-06 DIAGNOSIS — I10 Essential (primary) hypertension: Secondary | ICD-10-CM | POA: Diagnosis not present

## 2021-09-09 ENCOUNTER — Emergency Department (HOSPITAL_BASED_OUTPATIENT_CLINIC_OR_DEPARTMENT_OTHER): Payer: Medicare Other | Admitting: Radiology

## 2021-09-09 ENCOUNTER — Emergency Department (HOSPITAL_BASED_OUTPATIENT_CLINIC_OR_DEPARTMENT_OTHER)
Admission: EM | Admit: 2021-09-09 | Discharge: 2021-09-09 | Disposition: A | Discharge status: Home / Self Care | Payer: Medicare Other | Attending: Emergency Medicine | Admitting: Emergency Medicine

## 2021-09-09 ENCOUNTER — Other Ambulatory Visit: Payer: Self-pay

## 2021-09-09 ENCOUNTER — Encounter (HOSPITAL_BASED_OUTPATIENT_CLINIC_OR_DEPARTMENT_OTHER): Payer: Self-pay | Admitting: Emergency Medicine

## 2021-09-09 DIAGNOSIS — Z8546 Personal history of malignant neoplasm of prostate: Secondary | ICD-10-CM | POA: Insufficient documentation

## 2021-09-09 DIAGNOSIS — I10 Essential (primary) hypertension: Secondary | ICD-10-CM | POA: Insufficient documentation

## 2021-09-09 DIAGNOSIS — M25422 Effusion, left elbow: Secondary | ICD-10-CM | POA: Diagnosis not present

## 2021-09-09 DIAGNOSIS — Z794 Long term (current) use of insulin: Secondary | ICD-10-CM | POA: Diagnosis not present

## 2021-09-09 DIAGNOSIS — E119 Type 2 diabetes mellitus without complications: Secondary | ICD-10-CM | POA: Insufficient documentation

## 2021-09-09 DIAGNOSIS — M25522 Pain in left elbow: Secondary | ICD-10-CM

## 2021-09-09 DIAGNOSIS — Z87891 Personal history of nicotine dependence: Secondary | ICD-10-CM | POA: Diagnosis not present

## 2021-09-09 MED ORDER — PREDNISONE 20 MG PO TABS: ORAL_TABLET | ORAL | 0 refills | Status: DC

## 2021-09-09 MED ORDER — CEPHALEXIN 500 MG PO CAPS: 500.0000 mg | ORAL_CAPSULE | Freq: Four times a day (QID) | ORAL | 0 refills | Status: DC

## 2021-09-09 MED ORDER — PREDNISONE 50 MG PO TABS
60.0000 mg | ORAL_TABLET | Freq: Once | ORAL | Status: AC
  Administered 2021-09-09: 60 mg via ORAL
  Filled 2021-09-09: qty 1

## 2021-09-09 MED ORDER — CEPHALEXIN 250 MG PO CAPS
1000.0000 mg | ORAL_CAPSULE | Freq: Once | ORAL | Status: AC
  Administered 2021-09-09: 1000 mg via ORAL
  Filled 2021-09-09: qty 4

## 2021-09-09 MED ORDER — ACETAMINOPHEN 500 MG PO TABS
1000.0000 mg | ORAL_TABLET | Freq: Once | ORAL | Status: AC
  Administered 2021-09-09: 1000 mg via ORAL
  Filled 2021-09-09: qty 2

## 2021-09-09 NOTE — ED Triage Notes (Signed)
Pt to ER with c/o left arm/elbow pain and swelling for last several days.  Pt denies known injury.  Elbow with noted swelling that radiates into lower arm and fingers.  Pt endorses pain when elbow is touched.  Mild warmth noted, no redness.

## 2021-09-09 NOTE — Discharge Instructions (Addendum)
It was our pleasure to provide your ER care today - we hope that you feel better.  To help with pain/swelling, try icepack to sore area, elevate arm.   Take prednisone as prescribed. You may also take acetaminophen as need for pain.  Take keflex as prescribed.   Follow up with primary care doctor or orthopedist in 2-3 days if symptoms fail to improve/resolve.  Return to ER if worse, new symptoms, fevers, increased swelling/spreading redness, severe/intractable pain, or other concern.

## 2021-09-09 NOTE — ED Provider Notes (Signed)
[facility] EMERGENCY DEPT Provider Note   CSN: [id number] Arrival date & time: 09/09/21  1019     History Chief Complaint  Patient presents with   Arm Pain    Andre Stout is a 81 y.o. male.  Patient c/o left elbow area pain for the past several days. Symptoms acute onset, moderate, constant, persistent, dull, non radiating. Does not recall specific injury or overuse/strain of area. Denies hx same symptoms. Denies other joint pain. No redness to area. No fever, chills or sweats. No shoulder or wrist pain. No general or diffuse arm swelling, but localized to elbow. No numbness/weakness or loss of normal function. No hx gout.   The history is provided by the patient, a relative and medical records.  Arm Pain Pertinent negatives include no chest pain, no abdominal pain, no headaches and no shortness of breath.      Past Medical History:  Diagnosis Date   Diabetes mellitus without complication ([place])    Glaucoma    Prostate cancer ([place])    Been 3-4 years ago    Patient Active Problem List   Diagnosis Date Noted   Syncope and collapse 10/09/2020   Prostate cancer ([place]) 03/25/2019   Diabetes ([place]) 03/28/2016   Hypophosphatemia 03/06/2016   Hypomagnesemia 03/06/2016   Influenza A 03/05/2016   Benign essential HTN 03/05/2016   AKI (acute kidney injury) ([place]) 03/04/2016   Routine general medical examination at a health care facility 05/02/2015   Medicare annual wellness visit, subsequent 05/02/2015    Past Surgical History:  Procedure Laterality Date   CATARACT EXTRACTION Bilateral        Family History  Problem Relation Age of Onset   Diabetes Father    Diabetes Paternal Grandfather     Social History   Tobacco Use   Smoking status: Former    Packs/day: 0.50    Years: 4.00    Pack years: 2.00    Types: Cigarettes    Quit date: 10/29/1974    Years since quitting: 46.8   Smokeless tobacco: Never  Vaping Use   Vaping Use: Never used   Substance Use Topics   Alcohol use: No   Drug use: No    Home Medications Prior to Admission medications   Medication Sig Start Date End Date Taking? Authorizing Provider  ALPHAGAN P 0.15 % ophthalmic solution Place 1 drop into the left eye 2 (two) times daily.  02/11/16  Yes [provider]  COMBIGAN 0.2-0.5 % ophthalmic solution Place 1 drop into both eyes 2 (two) times daily. 12/19/15  Yes [provider]  dorzolamide (TRUSOPT) 2 % ophthalmic solution Place 1 drop into both eyes daily.  04/06/19  Yes [provider]  insulin NPH-regular Human (HUMULIN 70/30) (70-30) 100 UNIT/ML injection 32 units with breakfast, and 38 units with supper 06/23/21  Yes Renato Shin, MD  BD INSULIN SYRINGE U/F 31G X 5/16" 1 ML MISC USE 2 DAILY 08/24/21   Renato Shin, MD  benzonatate (TESSALON) 100 MG capsule Take 1 capsule (100 mg total) by mouth 3 (three) times daily as needed for cough. 08/22/21   [name], FNP  Cholecalciferol (VITAMIN D3) 2000 UNITS TABS Take 1 tablet by mouth daily.     [provider]  Cod Liver Oil 1000 MG CAPS Take 1 capsule by mouth daily.     [provider]  Misc Natural Products (URINOZINC PO) Take 1 tablet by mouth daily.     [provider]  Multiple  Vitamins-Minerals (CENTRUM ADULTS PO) Take 1 tablet by mouth daily.     [provider]  Omega 3 1200 MG CAPS Take 1 capsule by mouth daily.     [provider]  [facility] DELICA LANCETS 99991111 MISC Use to check blood sugar 4 times per day. Dx code: E11.9 03/27/16   Renato Shin, MD  [facility] ULTRA test strip USE TO MONITOR GLUCOSE LEVELS 4 TIMES PER DAY E11.9 06/10/19   Renato Shin, MD  PRESCRIPTION MEDICATION Gets injections in eyes at eye dr's office    [provider]    Allergies    Patient has no known allergies.  Review of Systems   Review of Systems  Constitutional:  Negative for chills, diaphoresis and fever.  HENT:  Negative  for sore throat.   Eyes:  Negative for redness.  Respiratory:  Negative for shortness of breath.   Cardiovascular:  Negative for chest pain.  Gastrointestinal:  Negative for abdominal pain.  Genitourinary:  Negative for flank pain.  Musculoskeletal:  Negative for neck pain.  Skin:  Negative for rash.  Neurological:  Negative for headaches.  Hematological:  Does not bruise/bleed easily.  Psychiatric/Behavioral:  Negative for confusion.    Physical Exam Updated Vital Signs BP (!) 172/78   Pulse 72   Temp 98.1 F (36.7 C) (Oral)   Resp 18   Ht 1.651 m ('5\' 5"'$ )   Wt 88.9 kg   SpO2 100%   BMI 32.62 kg/m   Physical Exam Vitals and nursing note reviewed.  Constitutional:      Appearance: Normal appearance. He is well-developed.  HENT:     Head: Atraumatic.     Nose: Nose normal.     Mouth/Throat:     Mouth: Mucous membranes are moist.  Eyes:     General: No scleral icterus.    Conjunctiva/sclera: Conjunctivae normal.  Neck:     Trachea: No tracheal deviation.  Cardiovascular:     Rate and Rhythm: Normal rate.     Pulses: Normal pulses.  Pulmonary:     Effort: Pulmonary effort is normal. No accessory muscle usage or respiratory distress.  Abdominal:     General: There is no distension.  Genitourinary:    Comments: No cva tenderness. Musculoskeletal:     Cervical back: Neck supple.     Comments: Mild swelling, tenderness, and sl increased warmth to left elbow, esp posteriorly. No erythema or skin lesions. No swelling to upper arm or forearm. Radial pulse 2+. Pain w active rom elbow. Minimal pain w passive rom elbow. No pain or tenderness to left shoulder or wrist. Compartments of left arm soft, not tense, and no severe swelling.   Skin:    General: Skin is warm and dry.     Findings: No rash.  Neurological:     Mental Status: He is alert.     Comments: Alert, speech clear. LUE NVI, with grossly intact motor/sens fxn.   Psychiatric:        Mood and Affect: Mood normal.     ED Results / Procedures / Treatments   Labs (all labs ordered are listed, but only abnormal results are displayed) Labs Reviewed - No data to display  EKG None  Radiology DG ELBOW COMPLETE LEFT (3+VIEW)  Result Date: 09/09/2021 CLINICAL DATA:  81 year old male with arm/elbow pain and swelling for several days. No known injury. EXAM: LEFT ELBOW - COMPLETE 3+ VIEW COMPARISON:  None. FINDINGS: Evidence of elbow joint effusion on the lateral view, posterior  fat pad is visible. Joint spaces and alignment are maintained. Radial head intact. No osteolysis. No acute osseous abnormality identified. Other soft tissue contours appear within normal limits. IMPRESSION: Suspected left elbow joint effusion, but no acute osseous abnormality identified. Electronically Signed   By: Genevie Ann M.D.   On: 09/09/2021 11:24    Procedures Procedures   Medications Ordered in ED Medications  predniSONE (DELTASONE) tablet 60 mg (has no administration in time range)  cephALEXin (KEFLEX) capsule 1,000 mg (has no administration in time range)  acetaminophen (TYLENOL) tablet 1,000 mg (1,000 mg Oral Given 09/09/21 1127)    ED Course  I have reviewed the triage vital signs and the nursing notes.  Pertinent labs & imaging results that were available during my care of the patient were reviewed by me and considered in my medical decision making (see chart for details).    MDM Rules/Calculators/A&P                          Pt took ibuprofen this AM. Acetaminophen po. Imaging ordered.   Reviewed nursing notes and prior charts for additional history.   Xrays reviewed/interpreted by me - no fx.   Currently no findings septic joint, and no fever, chills//sweats, and systemically well, not ill-appearing.   Will tx pred, keflex, ?inflamm arthritis, r/o bursitis.    Rec close pcp/ortho f/u if symptoms fail to improve/resolve.  Return precautions provided.    Final Clinical Impression(s) / ED Diagnoses Final  diagnoses:  None    Rx / DC Orders ED Discharge Orders     None        Lajean Saver, MD 09/09/21 1139

## 2021-09-12 ENCOUNTER — Ambulatory Visit (INDEPENDENT_AMBULATORY_CARE_PROVIDER_SITE_OTHER): Payer: Medicare Other | Admitting: Podiatry

## 2021-09-12 ENCOUNTER — Other Ambulatory Visit: Payer: Self-pay

## 2021-09-12 ENCOUNTER — Encounter: Payer: Self-pay | Admitting: Podiatry

## 2021-09-12 DIAGNOSIS — M79675 Pain in left toe(s): Secondary | ICD-10-CM

## 2021-09-12 DIAGNOSIS — E1065 Type 1 diabetes mellitus with hyperglycemia: Secondary | ICD-10-CM | POA: Diagnosis not present

## 2021-09-12 DIAGNOSIS — N179 Acute kidney failure, unspecified: Secondary | ICD-10-CM

## 2021-09-12 DIAGNOSIS — M79674 Pain in right toe(s): Secondary | ICD-10-CM

## 2021-09-12 DIAGNOSIS — B351 Tinea unguium: Secondary | ICD-10-CM | POA: Diagnosis not present

## 2021-09-12 NOTE — Progress Notes (Signed)
This patient returns to my office for at risk foot care.  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  This patient is unable to cut nails himself since the patient cannot reach his nails.These nails are painful walking and wearing shoes.  This patient presents for at risk foot care today.  General Appearance  Alert, conversant and in no acute stress.  Vascular  Dorsalis pedis and posterior tibial  pulses are palpable  bilaterally.  Capillary return is within normal limits  bilaterally. Temperature is within normal limits  bilaterally.  Neurologic  Senn-Weinstein monofilament wire test within normal limits  bilaterally. Muscle power within normal limits bilaterally.  Nails Thick disfigured discolored nails with subungual debris  from hallux to fifth toes bilaterally. No evidence of bacterial infection or drainage bilaterally.  Orthopedic  No limitations of motion  feet .  No crepitus or effusions noted.  No bony pathology or digital deformities noted.  Skin  normotropic skin with no porokeratosis noted bilaterally.  No signs of infections or ulcers noted.     Onychomycosis  Pain in right toes  Pain in left toes  Consent was obtained for treatment procedures.   Mechanical debridement of nails 1-5  bilaterally performed with a nail nipper.  Filed with dremel without incident.    Return office visit     3 months                 Told patient to return for periodic foot care and evaluation due to potential at risk complications.   Eldar Robitaille DPM   

## 2021-09-15 ENCOUNTER — Ambulatory Visit (INDEPENDENT_AMBULATORY_CARE_PROVIDER_SITE_OTHER): Payer: Medicare Other | Admitting: Endocrinology

## 2021-09-15 ENCOUNTER — Other Ambulatory Visit: Payer: Self-pay

## 2021-09-15 VITALS — BP 110/50 | HR 76 | Ht 65.0 in | Wt 196.0 lb

## 2021-09-15 DIAGNOSIS — N183 Chronic kidney disease, stage 3 unspecified: Secondary | ICD-10-CM | POA: Diagnosis not present

## 2021-09-15 DIAGNOSIS — E1065 Type 1 diabetes mellitus with hyperglycemia: Secondary | ICD-10-CM

## 2021-09-15 DIAGNOSIS — E119 Type 2 diabetes mellitus without complications: Secondary | ICD-10-CM | POA: Insufficient documentation

## 2021-09-15 DIAGNOSIS — E1022 Type 1 diabetes mellitus with diabetic chronic kidney disease: Secondary | ICD-10-CM

## 2021-09-15 LAB — POCT GLYCOSYLATED HEMOGLOBIN (HGB A1C): Hemoglobin A1C: 10.5 % — AB (ref 4.0–5.6)

## 2021-09-15 MED ORDER — HUMULIN 70/30 (70-30) 100 UNIT/ML ~~LOC~~ SUSP: SUBCUTANEOUS | 11 refills | Status: DC

## 2021-09-15 NOTE — Progress Notes (Signed)
Subjective:    Patient ID: Andre Stout, male    DOB: [birth year], 81 y.o.   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   Therapy: insulin since 2006.  DKA: once, in 2017.  Severe hypoglycemia: 2015 and 2021.   Pancreatitis: never.   SDOH: He is not a pump candidate, due to not recording cbg's; he takes human insulin, due to cost.   Other: emphasizing basal insulin did not help; he did not tolerate pioglitizone (edema); he takes BID insulin, after poor results with multiple daily injections; he was changed to 70/30, due to pattern of cbg's.   Interval history: pt says he never misses the insulin. no cbg record, but states cbg's vary from 40-400.  It is in general lowest in the afternoon, and highest fasting.  He has hypoglycemia approx once per month.  We are out of professional CGM sensors.   Past Medical History:  Diagnosis Date   Diabetes mellitus without complication ([place])    Glaucoma    Prostate cancer ([place])    Been 3-4 years ago    Past Surgical History:  Procedure Laterality Date   CATARACT EXTRACTION Bilateral     Social History   Socioeconomic History   Marital status: Widowed    Spouse name: Not on file   Number of children: 1   Years of education: 19   Highest education level: Not on file  Occupational History   Occupation: Retired  Tobacco Use   Smoking status: Former    Packs/day: 0.50    Years: 4.00    Pack years: 2.00    Types: Cigarettes    Quit date: 10/29/1974    Years since quitting: 46.9   Smokeless tobacco: Never  Vaping Use   Vaping Use: Never used  Substance and Sexual Activity   Alcohol use: No   Drug use: No   Sexual activity: Not Currently  Other Topics Concern   Not on file  Social History Narrative   Born and raised in [place], [place]. Currently reside in a private residence by himself. Daughter lives in the area. No live. Fun: hunt and fish   Denies  religious beliefs that would effect health care.    Social Determinants of Health   Financial Resource Strain: Not on file  Food Insecurity: Not on file  Transportation Needs: Not on file  Physical Activity: Not on file  Stress: Not on file  Social Connections: Not on file  Intimate Partner Violence: Not on file    Current Outpatient Medications on File Prior to Visit  Medication Sig Dispense Refill   ALPHAGAN P 0.15 % ophthalmic solution Place 1 drop into the left eye 2 (two) times daily.      BD INSULIN SYRINGE U/F 31G X 5/16" 1 ML MISC USE 2 DAILY 200 each 4   benzonatate (TESSALON) 100 MG capsule Take 1 capsule (100 mg total) by mouth 3 (three) times daily as needed for cough. 30 capsule 0   cephALEXin (KEFLEX) 500 MG capsule Take 1 capsule (500 mg total) by mouth 4 (four) times daily. 28 capsule 0   Cholecalciferol (VITAMIN D3) 2000 UNITS TABS Take 1 tablet by mouth daily.      Cod Liver Oil 1000 MG CAPS Take 1 capsule by mouth daily.      COMBIGAN 0.2-0.5 % ophthalmic solution Place 1 drop into both eyes 2 (two) times  daily.  12   dorzolamide (TRUSOPT) 2 % ophthalmic solution Place 1 drop into both eyes daily.      Misc Natural Products (URINOZINC PO) Take 1 tablet by mouth daily.      Multiple Vitamins-Minerals (CENTRUM ADULTS PO) Take 1 tablet by mouth daily.      Omega 3 1200 MG CAPS Take 1 capsule by mouth daily.      ONETOUCH DELICA LANCETS 99991111 MISC Use to check blood sugar 4 times per day. Dx code: E11.9 200 each 2   ONETOUCH ULTRA test strip USE TO MONITOR GLUCOSE LEVELS 4 TIMES PER DAY E11.9 100 each 2   predniSONE (DELTASONE) 20 MG tablet 3 po once a day for 2 days, then 2 po once a day for 3 days, then 1 po once a day for 3 days 15 tablet 0   PRESCRIPTION MEDICATION Gets injections in eyes at eye dr's office     No current facility-administered medications on file prior to visit.    No Known Allergies  Family History  Problem Relation Age of Onset   Diabetes  Father    Diabetes Paternal Grandfather     BP (!) 110/50 (BP Location: Right Arm, Patient Position: Sitting, Cuff Size: Large)   Pulse 76   Ht '5\' 5"'$  (1.651 m)   Wt 196 lb (88.9 kg)   SpO2 96%   BMI 32.62 kg/m    Review of Systems     Objective:   Physical Exam Pulses: dorsalis pedis intact bilat.   MSK: no deformity of the feet CV: 2+ bilat leg edema Skin:  no ulcer on the feet.  normal color and temp on the feet. Neuro: sensation is intact to touch on the feet Ext: there is bilateral onychomycosis of the toenails.   Lab Results  Component Value Date   CREATININE 1.44 02/20/2021   BUN 12 02/20/2021   NA 137 02/20/2021   K 4.5 02/20/2021   CL 99 02/20/2021   CO2 26 02/20/2021   Lab Results  Component Value Date   HGBA1C 10.5 (A) 09/15/2021      Assessment & Plan:  Type 1 DM: uncontrolled.  The pattern of his cbg's indicates he needs some adjustment in his therapy.   Patient Instructions  check your blood sugar twice a day.  vary the time of day when you check, between before the 3 meals, and at bedtime.  also check if you have symptoms of your blood sugar being too high or too low.  please keep a record of the readings and bring it to your next appointment here.  You can write it on any piece of paper.  please call us sooner if your blood sugar goes below 70, or if you have a lot of readings over 200.   Please change the insulin to 30 units with breakfast and 42 units with supper.   On this type of insulin schedule, you should eat meals on a regular schedule (especially lunch).  If a meal is missed or significantly delayed, your blood sugar could go low.   Please come back for a follow-up appointment in 2 months.

## 2021-09-15 NOTE — Patient Instructions (Addendum)
check your blood sugar twice a day.  vary the time of day when you check, between before the 3 meals, and at bedtime.  also check if you have symptoms of your blood sugar being too high or too low.  please keep a record of the readings and bring it to your next appointment here.  You can write it on any piece of paper.  please call us sooner if your blood sugar goes below 70, or if you have a lot of readings over 200.   Please change the insulin to 30 units with breakfast and 42 units with supper.   On this type of insulin schedule, you should eat meals on a regular schedule (especially lunch).  If a meal is missed or significantly delayed, your blood sugar could go low.   Please come back for a follow-up appointment in 2 months.

## 2021-10-03 ENCOUNTER — Ambulatory Visit (INDEPENDENT_AMBULATORY_CARE_PROVIDER_SITE_OTHER): Payer: Medicare Other | Admitting: Family

## 2021-10-03 ENCOUNTER — Other Ambulatory Visit: Payer: Self-pay

## 2021-10-03 VITALS — BP 160/84 | HR 56 | Temp 97.8°F | Ht 65.0 in | Wt 190.2 lb

## 2021-10-03 DIAGNOSIS — Z8739 Personal history of other diseases of the musculoskeletal system and connective tissue: Secondary | ICD-10-CM

## 2021-10-03 NOTE — Progress Notes (Signed)
Andre Stout is a 81 y.o. male with the following history as recorded in [employer]:  Patient Active Problem List   Diagnosis Date Noted   Diabetes ([place]) 09/15/2021   Syncope and collapse 10/09/2020   Prostate cancer ([place]) 03/25/2019   Hypophosphatemia 03/06/2016   Hypomagnesemia 03/06/2016   Influenza A 03/05/2016   Benign essential HTN 03/05/2016   AKI (acute kidney injury) ([place]) 03/04/2016   Routine general medical examination at a health care facility 05/02/2015   Medicare annual wellness visit, subsequent 05/02/2015    Current Outpatient Medications  Medication Sig Dispense Refill   ALPHAGAN P 0.15 % ophthalmic solution Place 1 drop into the left eye 2 (two) times daily.      BD INSULIN SYRINGE U/F 31G X 5/16" 1 ML MISC USE 2 DAILY 200 each 4   Cholecalciferol (VITAMIN D3) 2000 UNITS TABS Take 1 tablet by mouth daily.      Cod Liver Oil 1000 MG CAPS Take 1 capsule by mouth daily.      COMBIGAN 0.2-0.5 % ophthalmic solution Place 1 drop into both eyes 2 (two) times daily.  12   dorzolamide (TRUSOPT) 2 % ophthalmic solution Place 1 drop into both eyes daily.      insulin NPH-regular Human (HUMULIN 70/30) (70-30) 100 UNIT/ML injection 30 units with breakfast, and 42 units with supper. 30 mL 11   Misc Natural Products (URINOZINC PO) Take 1 tablet by mouth daily.      Multiple Vitamins-Minerals (CENTRUM ADULTS PO) Take 1 tablet by mouth daily.      Omega 3 1200 MG CAPS Take 1 capsule by mouth daily.      ONETOUCH DELICA LANCETS 18A MISC Use to check blood sugar 4 times per day. Dx code: E11.9 200 each 2   ONETOUCH ULTRA test strip USE TO MONITOR GLUCOSE LEVELS 4 TIMES PER DAY E11.9 100 each 2   PRESCRIPTION MEDICATION Gets injections in eyes at eye dr's office     No current facility-administered medications for this visit.    Allergies: Patient has no known allergies.  Past Medical History:  Diagnosis Date   Diabetes mellitus without complication ([facility])    Glaucoma    Prostate  cancer ([place])    Been 3-4 years ago    Past Surgical History:  Procedure Laterality Date   CATARACT EXTRACTION Bilateral     Family History  Problem Relation Age of Onset   Diabetes Father    Diabetes Paternal Grandfather     Social History   Tobacco Use   Smoking status: Former    Packs/day: 0.50    Years: 4.00    Pack years: 2.00    Types: Cigarettes    Quit date: 10/29/1974    Years since quitting: 46.9   Smokeless tobacco: Never  Substance Use Topics   Alcohol use: No    Subjective:  Seen at ER on 09/09/2021 with left elbow pain; treated for suspected bursitis with combination of prednisone and antibiotic; notes he is doing well today and has no pain/ full range of motion in elbow; no prior history of bursitis- no known injury to elbow;     Objective:  Vitals:   10/03/21 1421  BP: (!) 160/84  Pulse: (!) 56  Temp: 97.8 F (36.6 C)  TempSrc: Oral  SpO2: 98%  Weight: 190 lb 3.2 oz (86.3 kg)  Height: 5\' 5"  (1.651 m)    General: Well developed, well nourished, in no acute distress  Skin : Warm and  dry.  Head: Normocephalic and atraumatic  Lungs: Respirations unlabored;  Musculoskeletal: No deformities; no active joint inflammation  Extremities: No edema, cyanosis, clubbing  Vessels: Symmetric bilaterally  Neurologic: Alert and oriented; speech intact; face symmetrical; moves all extremities well; CNII-XII intact without focal deficit   Assessment:  1. H/O bursitis     Plan:  Appears resolved; check uric acid level today; to consider ortho evaluation if symptoms recur;  This visit occurred during the SARS-CoV-2 public health emergency.  Safety protocols were in place, including screening questions prior to the visit, additional usage of staff PPE, and extensive cleaning of exam room while observing appropriate contact time as indicated for disinfecting solutions.    No follow-ups on file.  Orders Placed This Encounter  Procedures   Uric acid    Requested  Prescriptions    No prescriptions requested or ordered in this encounter

## 2021-10-04 LAB — URIC ACID: Uric Acid, Serum: 6.1 mg/dL (ref 4.0–7.8)

## 2021-10-31 ENCOUNTER — Other Ambulatory Visit (HOSPITAL_COMMUNITY): Payer: Self-pay

## 2021-11-17 ENCOUNTER — Ambulatory Visit (INDEPENDENT_AMBULATORY_CARE_PROVIDER_SITE_OTHER): Payer: Medicare Other | Admitting: Endocrinology

## 2021-11-17 ENCOUNTER — Other Ambulatory Visit: Payer: Self-pay

## 2021-11-17 VITALS — BP 160/74 | HR 86 | Ht 65.0 in | Wt 196.2 lb

## 2021-11-17 DIAGNOSIS — E1065 Type 1 diabetes mellitus with hyperglycemia: Secondary | ICD-10-CM | POA: Diagnosis not present

## 2021-11-17 LAB — POCT GLYCOSYLATED HEMOGLOBIN (HGB A1C): Hemoglobin A1C: 8.8 % — AB (ref 4.0–5.6)

## 2021-11-17 NOTE — Patient Instructions (Addendum)
check your blood sugar twice a day.  vary the time of day when you check, between before the 3 meals, and at bedtime.  also check if you have symptoms of your blood sugar being too high or too low.  please keep a record of the readings and bring it to your next appointment here.  You can write it on any piece of paper.  please call us sooner if your blood sugar goes below 70, or if you have a lot of readings over 200.   Please continue the same insulin.    On this type of insulin schedule, you should eat meals on a regular schedule (especially lunch).  If a meal is missed or significantly delayed, your blood sugar could go low.   We are placing a continuous glucose monitor sensor today.  Please come back for a follow-up appointment in 2 weeks.

## 2021-11-17 NOTE — Progress Notes (Signed)
Subjective:    Patient ID: Andre Stout, male    DOB: [birth year], 81 y.o.   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   Therapy: insulin since 2006.  DKA: once, in 2017.  Severe hypoglycemia: 2015 and 2021.   Pancreatitis: never.   SDOH: He is not a pump candidate, due to not recording cbg's; he takes human insulin, due to cost.   Other: emphasizing basal insulin did not help; he did not tolerate pioglitizone (edema); he takes BID insulin, after poor results with multiple daily injections; he was changed to 70/30, due to pattern of cbg's.   Interval history: pt says he never misses the insulin. no cbg record, but states cbg's vary from 40-400.  It is in general lowest in middle of the night, and highest fasting.  He has hypoglycemia approx once per month.   Past Medical History:  Diagnosis Date   Diabetes mellitus without complication ([place])    Glaucoma    Prostate cancer ([place])    Been 3-4 years ago    Past Surgical History:  Procedure Laterality Date   CATARACT EXTRACTION Bilateral     Social History   Socioeconomic History   Marital status: Widowed    Spouse name: Not on file   Number of children: 1   Years of education: 29   Highest education level: Not on file  Occupational History   Occupation: Retired  Tobacco Use   Smoking status: Former    Packs/day: 0.50    Years: 4.00    Pack years: 2.00    Types: Cigarettes    Quit date: 10/29/1974    Years since quitting: 47.1   Smokeless tobacco: Never  Vaping Use   Vaping Use: Never used  Substance and Sexual Activity   Alcohol use: No   Drug use: No   Sexual activity: Not Currently  Other Topics Concern   Not on file  Social History Narrative   Born and raised in [place], [place]. Currently reside in a private residence by himself. Daughter lives in the area. No live. Fun: hunt and fish   Denies religious beliefs that would effect  health care.    Social Determinants of Health   Financial Resource Strain: Not on file  Food Insecurity: Not on file  Transportation Needs: Not on file  Physical Activity: Not on file  Stress: Not on file  Social Connections: Not on file  Intimate Partner Violence: Not on file    Current Outpatient Medications on File Prior to Visit  Medication Sig Dispense Refill   ALPHAGAN P 0.15 % ophthalmic solution Place 1 drop into the left eye 2 (two) times daily.      BD INSULIN SYRINGE U/F 31G X 5/16" 1 ML MISC USE 2 DAILY 200 each 4   Cholecalciferol (VITAMIN D3) 2000 UNITS TABS Take 1 tablet by mouth daily.      Cod Liver Oil 1000 MG CAPS Take 1 capsule by mouth daily.      COMBIGAN 0.2-0.5 % ophthalmic solution Place 1 drop into both eyes 2 (two) times daily.  12   dorzolamide (TRUSOPT) 2 % ophthalmic solution Place 1 drop into both eyes daily.      insulin NPH-regular Human (HUMULIN 70/30) (70-30) 100 UNIT/ML injection 30 units with breakfast, and 42 units with supper. 30 mL 11   Misc Natural Products (URINOZINC PO) Take 1 tablet  by mouth daily.      Multiple Vitamins-Minerals (CENTRUM ADULTS PO) Take 1 tablet by mouth daily.      Omega 3 1200 MG CAPS Take 1 capsule by mouth daily.      ONETOUCH DELICA LANCETS 26S MISC Use to check blood sugar 4 times per day. Dx code: E11.9 200 each 2   ONETOUCH ULTRA test strip USE TO MONITOR GLUCOSE LEVELS 4 TIMES PER DAY E11.9 100 each 2   PRESCRIPTION MEDICATION Gets injections in eyes at eye dr's office     No current facility-administered medications on file prior to visit.    No Known Allergies  Family History  Problem Relation Age of Onset   Diabetes Father    Diabetes Paternal Grandfather     BP (!) 160/74 (BP Location: Right Arm, Patient Position: Sitting, Cuff Size: Large)   Pulse 86   Ht 5\' 5"  (1.651 m)   Wt 196 lb 3.2 oz (89 kg)   SpO2 98%   BMI 32.65 kg/m    Review of Systems Denies LOC.      Objective:   Physical  Exam    Lab Results  Component Value Date   HGBA1C 8.8 (A) 11/17/2021      Assessment & Plan:  Type 1 DM: uncontrolled Hypoglycemia, due to insulin.   Patient Instructions  check your blood sugar twice a day.  vary the time of day when you check, between before the 3 meals, and at bedtime.  also check if you have symptoms of your blood sugar being too high or too low.  please keep a record of the readings and bring it to your next appointment here.  You can write it on any piece of paper.  please call us sooner if your blood sugar goes below 70, or if you have a lot of readings over 200.   Please continue the same insulin.    On this type of insulin schedule, you should eat meals on a regular schedule (especially lunch).  If a meal is missed or significantly delayed, your blood sugar could go low.   We are placing a continuous glucose monitor sensor today.  Please come back for a follow-up appointment in 2 weeks.

## 2021-12-06 ENCOUNTER — Ambulatory Visit (INDEPENDENT_AMBULATORY_CARE_PROVIDER_SITE_OTHER): Payer: Medicare Other | Admitting: Endocrinology

## 2021-12-06 ENCOUNTER — Other Ambulatory Visit: Payer: Self-pay

## 2021-12-06 ENCOUNTER — Encounter: Payer: Self-pay | Admitting: Endocrinology

## 2021-12-06 VITALS — BP 174/52 | HR 63 | Ht 65.0 in | Wt 200.8 lb

## 2021-12-06 DIAGNOSIS — E1022 Type 1 diabetes mellitus with diabetic chronic kidney disease: Secondary | ICD-10-CM | POA: Diagnosis not present

## 2021-12-06 DIAGNOSIS — N183 Chronic kidney disease, stage 3 unspecified: Secondary | ICD-10-CM | POA: Diagnosis not present

## 2021-12-06 MED ORDER — HUMULIN 70/30 (70-30) 100 UNIT/ML ~~LOC~~ SUSP: 36.0000 [IU] | Freq: Two times a day (BID) | SUBCUTANEOUS | 11 refills | Status: DC

## 2021-12-06 NOTE — Patient Instructions (Addendum)
check your blood sugar twice a day.  vary the time of day when you check, between before the 3 meals, and at bedtime.  also check if you have symptoms of your blood sugar being too high or too low.  please keep a record of the readings and bring it to your next appointment here.  You can write it on any piece of paper.  please call us sooner if your blood sugar goes below 70, or if you have a lot of readings over 200.   Please change the insulin to 36 units twice a day (with breakfast and with supper).    On this type of insulin schedule, you should eat meals on a regular schedule (especially lunch).  If a meal is missed or significantly delayed, your blood sugar could go low.   Please come back for a follow-up appointment in 2 months.

## 2021-12-06 NOTE — Progress Notes (Signed)
Subjective:    Patient ID: Andre Stout, male    DOB: 14-Jan-1940, 81 y.o.   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   Therapy: insulin since 2006.  DKA: once, in 2017.  Severe hypoglycemia: 2015 and 2021.   Pancreatitis: never.   SDOH: He is not a pump candidate, due to not recording cbg's; he takes human insulin, due to cost.   Other: emphasizing basal insulin did not help; he did not tolerate pioglitizone (edema); he takes BID insulin, after poor results with multiple daily injections; he was changed to 70/30, due to pattern of cbg's.   Interval history: Pt says he does not miss insulin doses.  pt states he feels well in general.  I reviewed continuous glucose monitor data.  Glucose varies from 50-260.  It is in general highest at 12N, and lowest at 6AM.  It decreases overnight.   Past Medical History:  Diagnosis Date   Diabetes mellitus without complication ([place])    Glaucoma    Prostate cancer ([place])    Been 3-4 years ago    Past Surgical History:  Procedure Laterality Date   CATARACT EXTRACTION Bilateral     Social History   Socioeconomic History   Marital status: Widowed    Spouse name: Not on file   Number of children: 1   Years of education: 45   Highest education level: Not on file  Occupational History   Occupation: Retired  Tobacco Use   Smoking status: Former    Packs/day: 0.50    Years: 4.00    Pack years: 2.00    Types: Cigarettes    Quit date: 10/29/1974    Years since quitting: 47.1   Smokeless tobacco: Never  Vaping Use   Vaping Use: Never used  Substance and Sexual Activity   Alcohol use: No   Drug use: No   Sexual activity: Not Currently  Other Topics Concern   Not on file  Social History Narrative   Born and raised in [place], [place]. Currently reside in a private residence by himself. Daughter lives in the area. No live. Fun: hunt and fish   Denies  religious beliefs that would effect health care.    Social Determinants of Health   Financial Resource Strain: Not on file  Food Insecurity: Not on file  Transportation Needs: Not on file  Physical Activity: Not on file  Stress: Not on file  Social Connections: Not on file  Intimate Partner Violence: Not on file    Current Outpatient Medications on File Prior to Visit  Medication Sig Dispense Refill   ALPHAGAN P 0.15 % ophthalmic solution Place 1 drop into the left eye 2 (two) times daily.      BD INSULIN SYRINGE U/F 31G X 5/16" 1 ML MISC USE 2 DAILY 200 each 4   Cholecalciferol (VITAMIN D3) 2000 UNITS TABS Take 1 tablet by mouth daily.      Cod Liver Oil 1000 MG CAPS Take 1 capsule by mouth daily.      COMBIGAN 0.2-0.5 % ophthalmic solution Place 1 drop into both eyes 2 (two) times daily.  12   dorzolamide (TRUSOPT) 2 % ophthalmic solution Place 1 drop into both eyes daily.      Misc Natural Products (URINOZINC PO) Take 1 tablet by mouth daily.      Multiple Vitamins-Minerals (CENTRUM ADULTS PO) Take 1 tablet by  mouth daily.      Omega 3 1200 MG CAPS Take 1 capsule by mouth daily.      ONETOUCH DELICA LANCETS 82M MISC Use to check blood sugar 4 times per day. Dx code: E11.9 200 each 2   ONETOUCH ULTRA test strip USE TO MONITOR GLUCOSE LEVELS 4 TIMES PER DAY E11.9 100 each 2   PRESCRIPTION MEDICATION Gets injections in eyes at eye dr's office     No current facility-administered medications on file prior to visit.    No Known Allergies  Family History  Problem Relation Age of Onset   Diabetes Father    Diabetes Paternal Grandfather     BP (!) 174/52   Pulse 63   Ht 5\' 5"  (1.651 m)   Wt 200 lb 12.8 oz (91.1 kg)   SpO2 96%   BMI 33.41 kg/m    Review of Systems     Objective:   Physical Exam    Lab Results  Component Value Date   CREATININE 1.44 02/20/2021   BUN 12 02/20/2021   NA 137 02/20/2021   K 4.5 02/20/2021   CL 99 02/20/2021   CO2 26 02/20/2021       Assessment & Plan:  Type 1 DM Hypoglycemia, due to insulin. The pattern of his cbg's indicates he needs some adjustment in his therapy  Patient Instructions  check your blood sugar twice a day.  vary the time of day when you check, between before the 3 meals, and at bedtime.  also check if you have symptoms of your blood sugar being too high or too low.  please keep a record of the readings and bring it to your next appointment here.  You can write it on any piece of paper.  please call us sooner if your blood sugar goes below 70, or if you have a lot of readings over 200.   Please change the insulin to 36 units twice a day (with breakfast and with supper).    On this type of insulin schedule, you should eat meals on a regular schedule (especially lunch).  If a meal is missed or significantly delayed, your blood sugar could go low.   Please come back for a follow-up appointment in 2 months.

## 2021-12-12 ENCOUNTER — Ambulatory Visit: Payer: Medicare Other | Admitting: Podiatry

## 2021-12-22 ENCOUNTER — Ambulatory Visit: Payer: Medicare Other | Admitting: Podiatry

## 2021-12-27 ENCOUNTER — Ambulatory Visit (INDEPENDENT_AMBULATORY_CARE_PROVIDER_SITE_OTHER): Payer: Medicare Other | Admitting: Podiatry

## 2021-12-27 ENCOUNTER — Encounter: Payer: Self-pay | Admitting: Podiatry

## 2021-12-27 ENCOUNTER — Other Ambulatory Visit: Payer: Self-pay

## 2021-12-27 DIAGNOSIS — M79675 Pain in left toe(s): Secondary | ICD-10-CM | POA: Diagnosis not present

## 2021-12-27 DIAGNOSIS — B351 Tinea unguium: Secondary | ICD-10-CM | POA: Diagnosis not present

## 2021-12-27 DIAGNOSIS — M79674 Pain in right toe(s): Secondary | ICD-10-CM

## 2021-12-27 DIAGNOSIS — N179 Acute kidney failure, unspecified: Secondary | ICD-10-CM | POA: Diagnosis not present

## 2021-12-27 DIAGNOSIS — E1065 Type 1 diabetes mellitus with hyperglycemia: Secondary | ICD-10-CM

## 2021-12-27 NOTE — Progress Notes (Signed)
This patient returns to my office for at risk foot care.  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  This patient is unable to cut nails himself since the patient cannot reach his nails.These nails are painful walking and wearing shoes.  This patient presents for at risk foot care today.  General Appearance  Alert, conversant and in no acute stress.  Vascular  Dorsalis pedis and posterior tibial  pulses are palpable  bilaterally.  Capillary return is within normal limits  bilaterally. Temperature is within normal limits  bilaterally.  Neurologic  Senn-Weinstein monofilament wire test within normal limits  bilaterally. Muscle power within normal limits bilaterally.  Nails Thick disfigured discolored nails with subungual debris  from hallux to fifth toes bilaterally. No evidence of bacterial infection or drainage bilaterally.  Orthopedic  No limitations of motion  feet .  No crepitus or effusions noted.  No bony pathology or digital deformities noted.  Skin  normotropic skin with no porokeratosis noted bilaterally.  No signs of infections or ulcers noted.     Onychomycosis  Pain in right toes  Pain in left toes  Consent was obtained for treatment procedures.   Mechanical debridement of nails 1-5  bilaterally performed with a nail nipper.  Filed with dremel without incident.    Return office visit     3 months                 Told patient to return for periodic foot care and evaluation due to potential at risk complications.   Andre Stout DPM   

## 2022-01-02 ENCOUNTER — Encounter (HOSPITAL_COMMUNITY): Payer: Self-pay

## 2022-01-02 ENCOUNTER — Inpatient Hospital Stay (HOSPITAL_COMMUNITY)
Admission: EM | Admit: 2022-01-02 | Discharge: 2022-01-05 | DRG: 101 | Disposition: A | Discharge status: Home Health | Payer: Medicare Other | Attending: Internal Medicine | Admitting: Internal Medicine

## 2022-01-02 ENCOUNTER — Other Ambulatory Visit: Payer: Self-pay

## 2022-01-02 ENCOUNTER — Emergency Department (HOSPITAL_COMMUNITY): Payer: Medicare Other

## 2022-01-02 DIAGNOSIS — W07XXXA Fall from chair, initial encounter: Secondary | ICD-10-CM | POA: Diagnosis present

## 2022-01-02 DIAGNOSIS — R55 Syncope and collapse: Secondary | ICD-10-CM | POA: Diagnosis not present

## 2022-01-02 DIAGNOSIS — I4589 Other specified conduction disorders: Secondary | ICD-10-CM | POA: Diagnosis present

## 2022-01-02 DIAGNOSIS — W1830XA Fall on same level, unspecified, initial encounter: Secondary | ICD-10-CM | POA: Diagnosis present

## 2022-01-02 DIAGNOSIS — E875 Hyperkalemia: Secondary | ICD-10-CM | POA: Diagnosis present

## 2022-01-02 DIAGNOSIS — E10649 Type 1 diabetes mellitus with hypoglycemia without coma: Secondary | ICD-10-CM | POA: Diagnosis present

## 2022-01-02 DIAGNOSIS — I10 Essential (primary) hypertension: Secondary | ICD-10-CM | POA: Diagnosis present

## 2022-01-02 DIAGNOSIS — R197 Diarrhea, unspecified: Secondary | ICD-10-CM | POA: Diagnosis present

## 2022-01-02 DIAGNOSIS — G40409 Other generalized epilepsy and epileptic syndromes, not intractable, without status epilepticus: Secondary | ICD-10-CM | POA: Diagnosis not present

## 2022-01-02 DIAGNOSIS — E1022 Type 1 diabetes mellitus with diabetic chronic kidney disease: Secondary | ICD-10-CM | POA: Diagnosis not present

## 2022-01-02 DIAGNOSIS — I248 Other forms of acute ischemic heart disease: Secondary | ICD-10-CM | POA: Diagnosis present

## 2022-01-02 DIAGNOSIS — Z87891 Personal history of nicotine dependence: Secondary | ICD-10-CM

## 2022-01-02 DIAGNOSIS — Z833 Family history of diabetes mellitus: Secondary | ICD-10-CM

## 2022-01-02 DIAGNOSIS — E876 Hypokalemia: Secondary | ICD-10-CM

## 2022-01-02 DIAGNOSIS — Z20822 Contact with and (suspected) exposure to covid-19: Secondary | ICD-10-CM | POA: Diagnosis present

## 2022-01-02 DIAGNOSIS — Z8546 Personal history of malignant neoplasm of prostate: Secondary | ICD-10-CM

## 2022-01-02 DIAGNOSIS — H409 Unspecified glaucoma: Secondary | ICD-10-CM | POA: Diagnosis present

## 2022-01-02 DIAGNOSIS — E669 Obesity, unspecified: Secondary | ICD-10-CM | POA: Diagnosis present

## 2022-01-02 DIAGNOSIS — Z79899 Other long term (current) drug therapy: Secondary | ICD-10-CM

## 2022-01-02 DIAGNOSIS — I441 Atrioventricular block, second degree: Secondary | ICD-10-CM | POA: Diagnosis present

## 2022-01-02 DIAGNOSIS — R9431 Abnormal electrocardiogram [ECG] [EKG]: Secondary | ICD-10-CM | POA: Diagnosis not present

## 2022-01-02 DIAGNOSIS — I442 Atrioventricular block, complete: Secondary | ICD-10-CM | POA: Diagnosis present

## 2022-01-02 DIAGNOSIS — E119 Type 2 diabetes mellitus without complications: Secondary | ICD-10-CM

## 2022-01-02 DIAGNOSIS — M7989 Other specified soft tissue disorders: Secondary | ICD-10-CM | POA: Diagnosis present

## 2022-01-02 DIAGNOSIS — N183 Chronic kidney disease, stage 3 unspecified: Secondary | ICD-10-CM

## 2022-01-02 DIAGNOSIS — R569 Unspecified convulsions: Secondary | ICD-10-CM | POA: Diagnosis not present

## 2022-01-02 DIAGNOSIS — D72829 Elevated white blood cell count, unspecified: Secondary | ICD-10-CM | POA: Diagnosis present

## 2022-01-02 DIAGNOSIS — I358 Other nonrheumatic aortic valve disorders: Secondary | ICD-10-CM | POA: Diagnosis present

## 2022-01-02 DIAGNOSIS — Z6831 Body mass index (BMI) 31.0-31.9, adult: Secondary | ICD-10-CM

## 2022-01-02 DIAGNOSIS — E1065 Type 1 diabetes mellitus with hyperglycemia: Secondary | ICD-10-CM | POA: Diagnosis not present

## 2022-01-02 DIAGNOSIS — R790 Abnormal level of blood mineral: Secondary | ICD-10-CM | POA: Diagnosis present

## 2022-01-02 DIAGNOSIS — Z794 Long term (current) use of insulin: Secondary | ICD-10-CM

## 2022-01-02 LAB — I-STAT VENOUS BLOOD GAS, ED
Acid-Base Excess: 0 mmol/L (ref 0.0–2.0)
Bicarbonate: 25.9 mmol/L (ref 20.0–28.0)
Calcium, Ion: 1.18 mmol/L (ref 1.15–1.40)
HCT: 44 % (ref 39.0–52.0)
Hemoglobin: 15 g/dL (ref 13.0–17.0)
O2 Saturation: 47 %
Potassium: 2.4 mmol/L — CL (ref 3.5–5.1)
Sodium: 139 mmol/L (ref 135–145)
TCO2: 27 mmol/L (ref 22–32)
pCO2, Ven: 46.1 mmHg (ref 44.0–60.0)
pH, Ven: 7.358 (ref 7.250–7.430)
pO2, Ven: 27 mmHg — CL (ref 32.0–45.0)

## 2022-01-02 LAB — CBC WITH DIFFERENTIAL/PLATELET
Abs Immature Granulocytes: 0.05 10*3/uL (ref 0.00–0.07)
Basophils Absolute: 0.1 10*3/uL (ref 0.0–0.1)
Basophils Relative: 1 %
Eosinophils Absolute: 0.3 10*3/uL (ref 0.0–0.5)
Eosinophils Relative: 3 %
HCT: 44.1 % (ref 39.0–52.0)
Hemoglobin: 14.2 g/dL (ref 13.0–17.0)
Immature Granulocytes: 0 %
Lymphocytes Relative: 21 %
Lymphs Abs: 2.4 10*3/uL (ref 0.7–4.0)
MCH: 27.6 pg (ref 26.0–34.0)
MCHC: 32.2 g/dL (ref 30.0–36.0)
MCV: 85.8 fL (ref 80.0–100.0)
Monocytes Absolute: 0.9 10*3/uL (ref 0.1–1.0)
Monocytes Relative: 8 %
Neutro Abs: 7.6 10*3/uL (ref 1.7–7.7)
Neutrophils Relative %: 67 %
Platelets: 226 10*3/uL (ref 150–400)
RBC: 5.14 MIL/uL (ref 4.22–5.81)
RDW: 13.7 % (ref 11.5–15.5)
WBC: 11.2 10*3/uL — ABNORMAL HIGH (ref 4.0–10.5)
nRBC: 0 % (ref 0.0–0.2)

## 2022-01-02 LAB — COMPREHENSIVE METABOLIC PANEL
ALT: 5 U/L (ref 0–44)
AST: 14 U/L — ABNORMAL LOW (ref 15–41)
Albumin: 2.4 g/dL — ABNORMAL LOW (ref 3.5–5.0)
Alkaline Phosphatase: 50 U/L (ref 38–126)
Anion gap: 10 (ref 5–15)
BUN: 6 mg/dL — ABNORMAL LOW (ref 8–23)
CO2: 21 mmol/L — ABNORMAL LOW (ref 22–32)
Calcium: 7.4 mg/dL — ABNORMAL LOW (ref 8.9–10.3)
Chloride: 108 mmol/L (ref 98–111)
Creatinine, Ser: 0.83 mg/dL (ref 0.61–1.24)
GFR, Estimated: >60 mL/min (ref >60)
Glucose, Bld: 69 mg/dL — ABNORMAL LOW (ref 70–99)
Potassium: 2.2 mmol/L — CL (ref 3.5–5.1)
Sodium: 139 mmol/L (ref 135–145)
Total Bilirubin: 1 mg/dL (ref 0.3–1.2)
Total Protein: 5.2 g/dL — ABNORMAL LOW (ref 6.5–8.1)

## 2022-01-02 LAB — CBG MONITORING, ED
Glucose-Capillary: 65 mg/dL — ABNORMAL LOW (ref 70–99)
Glucose-Capillary: 154 mg/dL — ABNORMAL HIGH (ref 70–99)

## 2022-01-02 LAB — TSH: TSH: 4.218 u[IU]/mL (ref 0.350–4.500)

## 2022-01-02 MED ORDER — ACETAMINOPHEN 325 MG PO TABS: 650.0000 mg | ORAL_TABLET | Freq: Four times a day (QID) | ORAL | Status: DC | PRN

## 2022-01-02 MED ORDER — ENOXAPARIN SODIUM 40 MG/0.4ML IJ SOSY
40.0000 mg | PREFILLED_SYRINGE | INTRAMUSCULAR | Status: DC
Start: 2022-01-03 — End: 2022-01-03

## 2022-01-02 MED ORDER — MAGNESIUM OXIDE -MG SUPPLEMENT 400 (240 MG) MG PO TABS
800.0000 mg | ORAL_TABLET | Freq: Once | ORAL | Status: AC
  Administered 2022-01-02: 800 mg via ORAL
  Filled 2022-01-02: qty 2

## 2022-01-02 MED ORDER — CHLORHEXIDINE GLUCONATE 0.12% ORAL RINSE (MEDLINE KIT)
15.0000 mL | Freq: Two times a day (BID) | OROMUCOSAL | Status: DC
  Administered 2022-01-03 (×2): 15 mL via OROMUCOSAL

## 2022-01-02 MED ORDER — DEXTROSE 50 % IV SOLN
1.0000 | Freq: Once | INTRAVENOUS | Status: AC
  Administered 2022-01-02: 50 mL via INTRAVENOUS
  Filled 2022-01-02: qty 50

## 2022-01-02 MED ORDER — ORAL CARE MOUTH RINSE
15.0000 mL | OROMUCOSAL | Status: DC
  Administered 2022-01-03 – 2022-01-04 (×4): 15 mL via OROMUCOSAL

## 2022-01-02 MED ORDER — POLYETHYLENE GLYCOL 3350 17 G PO PACK: 17.0000 g | PACK | Freq: Every day | ORAL | Status: DC | PRN

## 2022-01-02 MED ORDER — ACETAMINOPHEN 650 MG RE SUPP: 650.0000 mg | Freq: Four times a day (QID) | RECTAL | Status: DC | PRN

## 2022-01-02 MED ORDER — SODIUM CHLORIDE 0.9% FLUSH
3.0000 mL | Freq: Two times a day (BID) | INTRAVENOUS | Status: DC
  Administered 2022-01-03 – 2022-01-05 (×4): 3 mL via INTRAVENOUS

## 2022-01-02 MED ORDER — POTASSIUM CHLORIDE CRYS ER 20 MEQ PO TBCR
40.0000 meq | EXTENDED_RELEASE_TABLET | Freq: Once | ORAL | Status: AC
  Administered 2022-01-02: 40 meq via ORAL
  Filled 2022-01-02: qty 2

## 2022-01-02 MED ORDER — POTASSIUM CHLORIDE 10 MEQ/100ML IV SOLN
10.0000 meq | INTRAVENOUS | Status: AC
  Administered 2022-01-02 – 2022-01-03 (×5): 10 meq via INTRAVENOUS
  Filled 2022-01-02 (×5): qty 100

## 2022-01-02 MED ORDER — DEXTROSE 10 % IV SOLN: INTRAVENOUS | Status: DC

## 2022-01-02 NOTE — ED Triage Notes (Signed)
Pt arrived via [facility] from home for a witnessed seizure by family. Per EMS family said pt was standing then fell to floor and had a grand mal seizure the last 1 min and pt was unresponsive for 20 sec. Per family pt doesn't have a hx of seizures, but one year ago they found pt in a post tictal state when they came home. Pt is A&Ox1 to self only.

## 2022-01-02 NOTE — Consult Note (Signed)
Cardiology Consultation:   Patient ID: SHIMON TROWBRIDGE MRN: [id number]; DOB: Feb 15, 1940  Admit date: 01/02/2022 Date of Consult: 01/02/2022  PCP:  [name], [place] [facility] Cardiologist:  None        Patient Profile:   KENDEL BESSEY is a 82 y.o. male with a hx of IDDMII, HTN, prostate cancer who is being seen 01/02/2022 for the evaluation of an abnormal EKG at the request of Dr. Trilby Drummer.  History of Present Illness:   The following history was obtained from the patient and from chart review.  He is not the best historian.  Mr. Lenker reports that earlier today he was sitting watching television when he started to feel "dizzy."  He states that this dizzy feeling lasted for about 20 minutes before he " collapsed".  He states that he did not lose consciousness and he does not remember any seizure activity.  When asked what he remembers falling this episode of collapse, he does states that he felt dizzy and that his blood sugar was low.  His history is different from the history provided by EMS.  EMS noted that the patient's family brought him in after he fell to the floor while standing and had seizure-like activity lasting for 1 minute.  He was reportedly unresponsive for 20 seconds before regaining consciousness.  He is also reportedly confused upon awakening.  The patient does not remember these events.  Despite having this episode, the patient states that he has felt fine and has no other complaints.  He denies chest pain, SOB, fevers, chills, nausea, vomiting, abdominal pain, weakness/numbness, PND, orthopnea, palpitations, or bleeding.  He further denies medication changes or supplements use.  He does endorse having bilateral lower extremity swelling that is chronic and diarrhea that is nonbloody.  He was unable to quantify how long the diarrhea has been.  In the ED his VS were afebrile, HR 73, BP 170/79, RR 14 and satting 94% on RA.  Labs were notable for WBC  11.2, potassium 2.2, creatinine 0.8, BG 69, and normal TSH and hemoglobin.  A CT head showed no acute intracranial process.  An EKG was obtained which the machine interpreted as complete heart block.  Given his EKG read cardiology was consulted for evaluation.   Past Medical History:  Diagnosis Date   Diabetes mellitus without complication ([place])    Glaucoma    Prostate cancer ([place])    Been 3-4 years ago    Past Surgical History:  Procedure Laterality Date   CATARACT EXTRACTION Bilateral      Home Medications:  Prior to Admission medications   Medication Sig Start Date End Date Taking? Authorizing Provider  ALPHAGAN P 0.15 % ophthalmic solution Place 1 drop into the left eye 2 (two) times daily.  02/11/16   [provider]  BD INSULIN SYRINGE U/F 31G X 5/16" 1 ML MISC USE 2 DAILY 08/24/21   Renato Shin, MD  Cholecalciferol (VITAMIN D3) 2000 UNITS TABS Take 1 tablet by mouth daily.     [provider]  Cod Liver Oil 1000 MG CAPS Take 1 capsule by mouth daily.     [provider]  COMBIGAN 0.2-0.5 % ophthalmic solution Place 1 drop into both eyes 2 (two) times daily. 12/19/15   [provider]  dorzolamide (TRUSOPT) 2 % ophthalmic solution Place 1 drop into both eyes daily.  04/06/19   [provider]  insulin NPH-regular Human (HUMULIN 70/30) (70-30) 100 UNIT/ML injection Inject  36 Units into the skin 2 (two) times daily with a meal. With breakfast and with supper. 12/06/21   Renato Shin, MD  Misc Natural Products (URINOZINC PO) Take 1 tablet by mouth daily.     [provider]  Multiple Vitamins-Minerals (CENTRUM ADULTS PO) Take 1 tablet by mouth daily.     [provider]  Omega 3 1200 MG CAPS Take 1 capsule by mouth daily.     [provider]  [facility] DELICA LANCETS 26E MISC Use to check blood sugar 4 times per day. Dx code: E11.9 03/27/16   Renato Shin, MD  [facility] ULTRA test strip USE TO MONITOR GLUCOSE LEVELS  4 TIMES PER DAY E11.9 06/10/19   Renato Shin, MD  PRESCRIPTION MEDICATION Gets injections in eyes at eye dr's office    [provider]    Inpatient Medications: Scheduled Meds:  chlorhexidine gluconate (MEDLINE KIT)  15 mL Mouth Rinse BID   [START ON 01/03/2022] enoxaparin (LOVENOX) injection  40 mg Subcutaneous Q24H   [START ON 01/03/2022] mouth rinse  15 mL Mouth Rinse 10 times per day   sodium chloride flush  3 mL Intravenous Q12H   Continuous Infusions:  dextrose 75 mL/hr at 01/02/22 2218   potassium chloride     PRN Meds: acetaminophen **OR** acetaminophen, polyethylene glycol  Allergies:   No Known Allergies  Social History:   Social History   Socioeconomic History   Marital status: Widowed    Spouse name: Not on file   Number of children: 1   Years of education: 67   Highest education level: Not on file  Occupational History   Occupation: Retired  Tobacco Use   Smoking status: Former    Packs/day: 0.50    Years: 4.00    Pack years: 2.00    Types: Cigarettes    Quit date: 10/29/1974    Years since quitting: 47.2   Smokeless tobacco: Never  Vaping Use   Vaping Use: Never used  Substance and Sexual Activity   Alcohol use: No   Drug use: No   Sexual activity: Not Currently  Other Topics Concern   Not on file  Social History Narrative   Born and raised in [place], [place]. Currently reside in a private residence by himself. Daughter lives in the area. No live. Fun: hunt and fish   Denies religious beliefs that would effect health care.    Social Determinants of Health   Financial Resource Strain: Not on file  Food Insecurity: Not on file  Transportation Needs: Not on file  Physical Activity: Not on file  Stress: Not on file  Social Connections: Not on file  Intimate Partner Violence: Not on file    Family History:    Family History  Problem Relation Age of Onset   Diabetes Father    Diabetes Paternal Grandfather      ROS:  Please see the  history of present illness.   All other ROS reviewed and negative.     Physical Exam/Data:   Vitals:   01/02/22 1930 01/02/22 2000 01/02/22 2030 01/02/22 2206  BP: (!) 142/78 (!) 154/71 (!) 144/83 139/82  Pulse: 74 82 77 63  Resp: 15 16 15 17   Temp:      TempSrc:      SpO2: 95% 94% 96% 96%  Weight:      Height:       No intake or output data in the 24 hours ending 01/02/22 2308 Last 3 Weights 01/02/2022 12/06/2021  11/17/2021  Weight (lbs) 200 lb 13.4 oz 200 lb 12.8 oz 196 lb 3.2 oz  Weight (kg) 91.1 kg 91.082 kg 88.996 kg     Body mass index is 33.42 kg/m.  General: Elderly gentleman in NAD, laying comfortably in bed, alert HEENT: Atraumatic, normocephalic Neck: no JVD Vascular: Distal pulses 2+ bilaterally Cardiac:  normal S1, S2; occasional ectopy with normal rate; no murmur, rubs or gallops Lungs:  clear to auscultation bilaterally, no wheezing, rhonchi or rales  Abd: soft, nontender, no hepatomegaly  Ext: no edema Musculoskeletal: 2+ pitting edema to shins bilaterally, WWP Skin: warm and dry  Neuro: Grossly normal strength movement symmetrically throughout, A&Ox2 (person and place) Psych:  Normal affect   EKG:  The EKG was personally reviewed and demonstrates:  AV dissociation   Initial:    Subsequent:     Telemetry:  Telemetry was personally reviewed and demonstrates:  NSR with PVCs  Relevant CV Studies:  TTE 10/10/20:  IMPRESSIONS     1. Left ventricular ejection fraction, by estimation, is 60 to 65%. The  left ventricle has normal function. The left ventricle has no regional  wall motion abnormalities. Left ventricular diastolic parameters were  normal.   2. Right ventricular systolic function is normal. The right ventricular  size is normal. Tricuspid regurgitation signal is inadequate for assessing  PA pressure.   3. The mitral valve is normal in structure. Trivial mitral valve  regurgitation. No evidence of mitral stenosis.   4. The aortic  valve is tricuspid. There is mild calcification of the  aortic valve. Aortic valve regurgitation is not visualized. No aortic  stenosis is present.   5. The inferior vena cava is normal in size with greater than 50%  respiratory variability, suggesting right atrial pressure of 3 mmHg.   Laboratory Data:  High Sensitivity Troponin:  No results for input(s): TROPONINIHS in the last 720 hours.   Chemistry Recent Labs  Lab 01/02/22 2007 01/02/22 2008  NA 139 139  K 2.4* 2.2*  CL  --  108  CO2  --  21*  GLUCOSE  --  69*  BUN  --  6*  CREATININE  --  0.83  CALCIUM  --  7.4*  GFRNONAA  --  >60  ANIONGAP  --  10    Recent Labs  Lab 01/02/22 2008  PROT 5.2*  ALBUMIN 2.4*  AST 14*  ALT 5  ALKPHOS 50  BILITOT 1.0   Lipids No results for input(s): CHOL, TRIG, HDL, LABVLDL, LDLCALC, CHOLHDL in the last 168 hours.  Hematology Recent Labs  Lab 01/02/22 2007 01/02/22 2008  WBC  --  11.2*  RBC  --  5.14  HGB 15.0 14.2  HCT 44.0 44.1  MCV  --  85.8  MCH  --  27.6  MCHC  --  32.2  RDW  --  13.7  PLT  --  226   Thyroid  Recent Labs  Lab 01/02/22 2008  TSH 4.218    BNPNo results for input(s): BNP, PROBNP in the last 168 hours.  DDimer No results for input(s): DDIMER in the last 168 hours.   Radiology/Studies:  CT Head Wo Contrast  Result Date: 01/02/2022 CLINICAL DATA:  Seizure disorder, clinical change EXAM: CT HEAD WITHOUT CONTRAST TECHNIQUE: Contiguous axial images were obtained from the base of the skull through the vertex without intravenous contrast. COMPARISON:  CT head 10/09/2020 BRAIN: BRAIN Cerebral ventricle sizes are concordant with the degree of cerebral volume loss. Patchy and  confluent areas of decreased attenuation are noted throughout the deep and periventricular white matter of the cerebral hemispheres bilaterally, compatible with chronic microvascular ischemic disease. No evidence of large-territorial acute infarction. No parenchymal hemorrhage. No mass  lesion. No extra-axial collection. No mass effect or midline shift. No hydrocephalus. Basilar cisterns are patent. Vascular: No hyperdense vessel. Skull: Limited evaluation for skull fracture due to motion artifact. Sinuses/Orbits: Paranasal sinuses and mastoid air cells are clear. The orbits are unremarkable. Other: None. IMPRESSION: No acute intracranial abnormality. Electronically Signed   By: Iven Finn M.D.   On: 01/02/2022 21:04     Assessment and Plan:   #AV Dissociation #Syncopal Event c/f Seizure :: Patient presenting with an episode of unconsciousness with family reporting the patient having a GTC.  EKG on admission appears to demonstrate A-V dissociation with an atrial rate faster than the ventricular rate consistent with complete heart block.  A subsequent EKG however, appears to show NSR with ventricular ectopy.  Prior EKGs have shown conduction disease with a very prolonged PR interval and LAFB, but no prior A-V dissociation.  The patient is currently asymptomatic.  My biggest concern for this patient's conduction abnormalities this is electrolyte derangements.  He is profoundly hypokalemic for reasons that I am unsure of at this time.  Although hyperkalemia is more commonly associated with complete heart block and hypokalemia, certainly hypokalemia to this degree can be responsible for transient arrhythmias.  This could also just represent progression of degeneration of his electrical system (given his baseline conduction abnormalities) and now he has complete heart block.  While he makes most sense currently is to replete the patient's potassium and continue to monitor him on telemetry.  If his conduction issues resolve with correction of his potassium then that may be reflective of hypokalemia being a reversible cause.  If his conduction abnormalities persist despite repletion of potassium, then he would warrant further evaluation and consideration of PPM placement.  Also recommend  getting a TTE to assess for any structural heart issues.  TTE 2 years ago was normal. -Aggressively replete potassium to >4 -TTE complete -Maintain telemetry -Please call cardiology if the patient becomes symptomatic or profoundly bradycardic   Risk Assessment/Risk Scores:                For questions or updates, please contact [facility] Please consult [email or web address] for contact info under    Signed, Hershal Coria, MD  01/02/2022 11:08 PM

## 2022-01-02 NOTE — ED Provider Notes (Signed)
[facility] EMERGENCY DEPARTMENT Provider Note   CSN: [id number] Arrival date & time: 01/02/22  1911     History  Chief Complaint  Patient presents with   Seizures    Andre Stout is a 82 y.o. 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  Patient arrives postictal and cannot give appropriate history.  History obtained from multiple family members stating that they found the patient attempting to get out of a chair, fell backwards and then onto the ground with approximately 1 minute of G TC seizure-like behavior.  Per family, EMS reported the patient's sugar to be 112.  No abortive medications given.     Seizures    Past Medical History:  Diagnosis Date   Diabetes mellitus without complication ([place])    Glaucoma    Prostate cancer ([place])    Been 3-4 years ago     Home Medications Prior to Admission medications   Medication Sig Start Date End Date Taking? Authorizing Provider  ALPHAGAN P 0.15 % ophthalmic solution Place 1 drop into the left eye 2 (two) times daily.  02/11/16   [provider]  BD INSULIN SYRINGE U/F 31G X 5/16" 1 ML MISC USE 2 DAILY 08/24/21   Renato Shin, MD  Cholecalciferol (VITAMIN D3) 2000 UNITS TABS Take 1 tablet by mouth daily.     [provider]  Cod Liver Oil 1000 MG CAPS Take 1 capsule by mouth daily.     [provider]  COMBIGAN 0.2-0.5 % ophthalmic solution Place 1 drop into both eyes 2 (two) times daily. 12/19/15   [provider]  dorzolamide (TRUSOPT) 2 % ophthalmic solution Place 1 drop into both eyes daily.  04/06/19   [provider]  insulin NPH-regular Human (HUMULIN 70/30) (70-30) 100 UNIT/ML injection Inject 36 Units into the skin 2 (two) times daily with a meal. With breakfast and with supper. 12/06/21   Renato Shin, MD  Misc Natural Products (URINOZINC PO) Take 1 tablet by mouth daily.      [provider]  Multiple Vitamins-Minerals (CENTRUM ADULTS PO) Take 1 tablet by mouth daily.     [provider]  Omega 3 1200 MG CAPS Take 1 capsule by mouth daily.     [provider]  [facility] DELICA LANCETS 65B MISC Use to check blood sugar 4 times per day. Dx code: E11.9 03/27/16   Renato Shin, MD  [facility] ULTRA test strip USE TO MONITOR GLUCOSE LEVELS 4 TIMES PER DAY E11.9 06/10/19   Renato Shin, MD  PRESCRIPTION MEDICATION Gets injections in eyes at eye dr's office    [provider]      Allergies    Patient has no known allergies.    Review of Systems   Review of Systems  Neurological:  Positive for seizures.   Physical Exam Updated Vital Signs BP 139/82    Pulse 63    Temp (!) 97.4 F (36.3 C) (Oral)    Resp 17    Ht _0  (1.651 m)    Wt 91.1 kg    SpO2 96%    BMI 33.42 kg/m  Physical Exam Vitals and nursing note reviewed.  Constitutional:      General: He is not in acute distress.    Appearance: He is well-developed.  HENT:     Head: Normocephalic and atraumatic.  Eyes:     Conjunctiva/sclera: Conjunctivae normal.  Cardiovascular:     Rate and Rhythm: Normal rate and regular rhythm.     Heart sounds: No murmur heard. Pulmonary:     Effort: Pulmonary effort is normal. No respiratory distress.     Breath sounds: Normal breath sounds.  Abdominal:     Palpations: Abdomen is soft.     Tenderness: There is no abdominal tenderness.  Musculoskeletal:        General: No swelling.     Cervical back: Neck supple.  Skin:    General: Skin is warm and dry.     Capillary Refill: Capillary refill takes less than 2 seconds.  Neurological:     Mental Status: He is alert. He is disoriented.     Cranial Nerves: No cranial nerve deficit.     Sensory: No sensory deficit.     Motor: No weakness.  Psychiatric:        Mood and Affect: Mood normal.    ED Results / Procedures / Treatments   Labs (all labs ordered are listed, but only  abnormal results are displayed) Labs Reviewed  COMPREHENSIVE METABOLIC PANEL - Abnormal; Notable for the following components:      Result Value   Potassium 2.2 (*)    CO2 21 (*)    Glucose, Bld 69 (*)    BUN 6 (*)    Calcium 7.4 (*)    Total Protein 5.2 (*)    Albumin 2.4 (*)    AST 14 (*)    All other components within normal limits  CBC WITH DIFFERENTIAL/PLATELET - Abnormal; Notable for the following components:   WBC 11.2 (*)    All other components within normal limits  I-STAT VENOUS BLOOD GAS, ED - Abnormal; Notable for the following components:   pO2, Ven 27.0 (*)    Potassium 2.4 (*)    All other components within normal limits  CBG MONITORING, ED - Abnormal; Notable for the following components:   Glucose-Capillary 65 (*)    All other components within normal limits  CBG MONITORING, ED - Abnormal; Notable for the following components:   Glucose-Capillary 154 (*)    All other components within normal limits  TSH  URINALYSIS, ROUTINE W REFLEX MICROSCOPIC  MAGNESIUM  BASIC METABOLIC PANEL  CBC  TROPONIN I (HIGH SENSITIVITY)    EKG EKG Interpretation  Date/Time:  Tuesday January 02 2022 19:12:04 EST Ventricular Rate:  71 PR Interval:    QRS Duration: 102 QT Interval:  492 QTC Calculation: 535 R Axis:   -41 Text Interpretation: AV block, complete (third degree) Prolonged QT interval Confirmed by Jasn Xia ([id number]) on 01/02/2022 11:04:37 PM  Radiology CT Head Wo Contrast  Result Date: 01/02/2022 CLINICAL DATA:  Seizure disorder, clinical change EXAM: CT HEAD WITHOUT CONTRAST TECHNIQUE: Contiguous axial images were obtained from the base of the skull through the vertex without intravenous contrast. COMPARISON:  CT head 10/09/2020 BRAIN: BRAIN Cerebral ventricle sizes are concordant with the degree of cerebral volume loss. Patchy and confluent areas of decreased attenuation are noted throughout the deep and periventricular white matter of the cerebral hemispheres  bilaterally, compatible with chronic microvascular ischemic disease. No evidence of large-territorial acute infarction. No parenchymal hemorrhage. No mass lesion. No extra-axial collection. No mass effect or midline shift. No hydrocephalus. Basilar cisterns are patent. Vascular: No hyperdense vessel. Skull: Limited evaluation for skull fracture due to motion artifact. Sinuses/Orbits: Paranasal sinuses and mastoid air cells are clear. The orbits are unremarkable. Other: None. IMPRESSION: No acute intracranial abnormality. Electronically  Signed   By: Iven Finn M.D.   On: 01/02/2022 21:04    Procedures .Critical Care Performed by: Teressa Lower, MD Authorized by: Teressa Lower, MD   Critical care provider statement:    Critical care time (minutes):  30   Critical care was necessary to treat or prevent imminent or life-threatening deterioration of the following conditions:  Metabolic crisis (severe hypokalemia)   Critical care was time spent personally by me on the following activities:  Development of treatment plan with patient or surrogate, discussions with consultants, evaluation of patient's response to treatment, examination of patient, ordering and review of laboratory studies, ordering and review of radiographic studies, ordering and performing treatments and interventions, pulse oximetry, re-evaluation of patient's condition and review of old charts    Medications Ordered in ED Medications  dextrose 10 % infusion ( Intravenous New Bag/Given 01/02/22 2218)  chlorhexidine gluconate (MEDLINE KIT) (PERIDEX) 0.12 % solution 15 mL (15 mLs Mouth Rinse Not Given 01/02/22 2318)  MEDLINE mouth rinse (has no administration in time range)  enoxaparin (LOVENOX) injection 40 mg (has no administration in time range)  sodium chloride flush (NS) 0.9 % injection 3 mL (3 mLs Intravenous Not Given 01/02/22 2313)  potassium chloride 10 mEq in 100 mL IVPB (10 mEq Intravenous New Bag/Given 01/02/22 2317)   acetaminophen (TYLENOL) tablet 650 mg (has no administration in time range)    Or  acetaminophen (TYLENOL) suppository 650 mg (has no administration in time range)  polyethylene glycol (MIRALAX / GLYCOLAX) packet 17 g (has no administration in time range)  potassium chloride SA (KLOR-CON M) CR tablet 40 mEq (40 mEq Oral Given 01/02/22 2144)  magnesium oxide (MAG-OX) tablet 800 mg (800 mg Oral Given 01/02/22 2145)  dextrose 50 % solution 50 mL (50 mLs Intravenous Given 01/02/22 2216)    ED Course/ Medical Decision Making/ A&P                           Medical Decision Making  Patient seen the emergency department for evaluation of seizure-like behavior.  Physical exam reveals a postictal patient with no focal motor or sensory deficits.  Cardiopulmonary exam otherwise unremarkable.  Laboratory evaluation with a pH of 7.35 with a PCO2 of 46.1, potassium 2.2, initial blood sugar 69, hypoalbuminemia to 2.4, mild leukocytosis to 11.2 likely stress demargination from the patient's seizure.  Potassium repleted here in the emergency department and the patient was given 2 cups of orange juice.  Repeat sugar 65 and thus he was given an amp of D50 and placed on a D10 drip.  CT head unremarkable.  I personally reviewed this image and this was confirmed by radiology.  On reevaluation, patient appears to be returning to his mental status baseline and states that he thinks his sugar dropped too low.  In the setting of hypokalemia and hypoglycemia this certainly would explain his seizure but we will admit for observation as patient does also take an oral hypoglycemic.  I spoke directly with neurology about this plan and they agree to not pursue advanced seizure work-up at this time.  Incidentally, upon review of ECG after patient admitted, he is found to have a new third-degree heart block.  He does not have any bradycardia or hypotension but the medicine team consulted cardiology and will be following up on this.          Final Clinical Impression(s) / ED Diagnoses Final diagnoses:  None    Rx /  DC Orders ED Discharge Orders     None         Deryn Massengale, Debe Coder, MD 01/02/22 2356

## 2022-01-02 NOTE — H&P (Addendum)
History and Physical   Andre Stout:[id number] DOB: July 31, 1940 DOA: 01/02/2022  PCP: [name], FNP   Patient coming from: Home  Chief Complaint: Witnessed seizure  HPI: Andre Stout is a 82 y.o. male with medical history significant of syncope, prostate cancer, diabetes, hypertension presenting after seizure.  History obtained with assistance of chart review and family given patient was initially postictal and does not remember the event.  Patient was noted by family to be standing and then all of a sudden fell to the floor and had a grand mal seizure lasting about a minute.  He was then unresponsive for around 20 seconds after this.  No known seizure history.  His family states that they may have found him confused in the past unclear if this was postictal or not.  EMS was called and on their evaluation initial blood sugar was 112, blood sugar on arrival to the ED was lower as below.  Patient states he may have had some dizziness.  Patient denies fevers, chills, chest pain, shortness of breath, abdominal pain, constipation, diarrhea, nausea, vomiting.  ED Course: Vital signs in the ED significant for blood pressure in the 650P and 546F systolic.  Lab work-up showed CMP with potassium 2.2, bicarb 21, glucose 69, calcium 7.4, protein 5.2, albumin 2.4.  CBC with mild leukocytosis to 11.2.  TSH within normal limits.  Urinalysis pending.  CT of the head with no acute abnormality.  VBG with normal pH and normal PCO2.  Patient received in her milligrams p.o. magnesium 40 mEq p.o. potassium, D50, D10 drip.  Started on D10 and D50 after initial blood sugar was 69 and then reduced to 65 after receiving orange juice.  Review of Systems: As per HPI otherwise all other systems reviewed and are negative.  Past Medical History:  Diagnosis Date   Diabetes mellitus without complication ([place])    Glaucoma    Prostate cancer ([place])    Been 3-4 years ago    Past Surgical History:   Procedure Laterality Date   CATARACT EXTRACTION Bilateral     Social History  reports that he quit smoking about 47 years ago. His smoking use included cigarettes. He has a 2.00 pack-year smoking history. He has never used smokeless tobacco. He reports that he does not drink alcohol and does not use drugs.  No Known Allergies  Family History  Problem Relation Age of Onset   Diabetes Father    Diabetes Paternal Grandfather   Reviewed on admission  Prior to Admission medications   Medication Sig Start Date End Date Taking? Authorizing Provider  ALPHAGAN P 0.15 % ophthalmic solution Place 1 drop into the left eye 2 (two) times daily.  02/11/16   [provider]  BD INSULIN SYRINGE U/F 31G X 5/16" 1 ML MISC USE 2 DAILY 08/24/21   Renato Shin, MD  Cholecalciferol (VITAMIN D3) 2000 UNITS TABS Take 1 tablet by mouth daily.     [provider]  Cod Liver Oil 1000 MG CAPS Take 1 capsule by mouth daily.     [provider]  COMBIGAN 0.2-0.5 % ophthalmic solution Place 1 drop into both eyes 2 (two) times daily. 12/19/15   [provider]  dorzolamide (TRUSOPT) 2 % ophthalmic solution Place 1 drop into both eyes daily.  04/06/19   [provider]  insulin NPH-regular Human (HUMULIN 70/30) (70-30) 100 UNIT/ML injection Inject 36 Units into the skin 2 (two) times daily with a meal. With breakfast and  with supper. 12/06/21   Renato Shin, MD  Misc Natural Products (URINOZINC PO) Take 1 tablet by mouth daily.     [provider]  Multiple Vitamins-Minerals (CENTRUM ADULTS PO) Take 1 tablet by mouth daily.     [provider]  Omega 3 1200 MG CAPS Take 1 capsule by mouth daily.     [provider]  [facility] DELICA LANCETS 70Y MISC Use to check blood sugar 4 times per day. Dx code: E11.9 03/27/16   Renato Shin, MD  [facility] ULTRA test strip USE TO MONITOR GLUCOSE LEVELS 4 TIMES PER DAY E11.9 06/10/19   Renato Shin, MD  PRESCRIPTION  MEDICATION Gets injections in eyes at eye dr's office    [provider]    Physical Exam: Vitals:   01/02/22 1930 01/02/22 2000 01/02/22 2030 01/02/22 2206  BP: (!) 142/78 (!) 154/71 (!) 144/83 139/82  Pulse: 74 82 77 63  Resp: 15 16 15 17   Temp:      TempSrc:      SpO2: 95% 94% 96% 96%  Weight:      Height:       Physical Exam Constitutional:      General: He is not in acute distress.    Appearance: Normal appearance.  HENT:     Head: Normocephalic and atraumatic.     Mouth/Throat:     Mouth: Mucous membranes are moist.     Pharynx: Oropharynx is clear.  Eyes:     Extraocular Movements: Extraocular movements intact.     Pupils: Pupils are equal, round, and reactive to light.  Cardiovascular:     Rate and Rhythm: Normal rate and regular rhythm.     Pulses: Normal pulses.     Heart sounds: Normal heart sounds.  Pulmonary:     Effort: Pulmonary effort is normal. No respiratory distress.     Breath sounds: Normal breath sounds.  Abdominal:     General: Bowel sounds are normal. There is no distension.     Palpations: Abdomen is soft.     Tenderness: There is no abdominal tenderness.  Musculoskeletal:        General: No swelling or deformity.     Right lower leg: Edema present.     Left lower leg: Edema present.  Skin:    General: Skin is warm and dry.  Neurological:     General: No focal deficit present.     Mental Status: Mental status is at baseline.   Labs on Admission: I have personally reviewed following labs and imaging studies  CBC: Recent Labs  Lab 01/02/22 2007 01/02/22 2008  WBC  --  11.2*  NEUTROABS  --  7.6  HGB 15.0 14.2  HCT 44.0 44.1  MCV  --  85.8  PLT  --  637    Basic Metabolic Panel: Recent Labs  Lab 01/02/22 2007 01/02/22 2008  NA 139 139  K 2.4* 2.2*  CL  --  108  CO2  --  21*  GLUCOSE  --  69*  BUN  --  6*  CREATININE  --  0.83  CALCIUM  --  7.4*    GFR: Estimated Creatinine Clearance: 72.4 mL/min (by C-G  formula based on SCr of 0.83 mg/dL).  Liver Function Tests: Recent Labs  Lab 01/02/22 2008  AST 14*  ALT 5  ALKPHOS 50  BILITOT 1.0  PROT 5.2*  ALBUMIN 2.4*    Urine analysis:    Component Value Date/Time   COLORURINE  YELLOW 10/09/2020 [place] 10/09/2020 1327   LABSPEC 1.020 10/09/2020 1327   PHURINE 6.0 10/09/2020 1327   GLUCOSEU 150 (A) 10/09/2020 1327   HGBUR NEGATIVE 10/09/2020 1327   BILIRUBINUR NEGATIVE 10/09/2020 1327   KETONESUR 5 (A) 10/09/2020 1327   PROTEINUR 30 (A) 10/09/2020 1327   UROBILINOGEN 1.0 10/04/2009 1109   NITRITE NEGATIVE 10/09/2020 [place] 10/09/2020 1327    Radiological Exams on Admission: CT Head Wo Contrast  Result Date: 01/02/2022 CLINICAL DATA:  Seizure disorder, clinical change EXAM: CT HEAD WITHOUT CONTRAST TECHNIQUE: Contiguous axial images were obtained from the base of the skull through the vertex without intravenous contrast. COMPARISON:  CT head 10/09/2020 BRAIN: BRAIN Cerebral ventricle sizes are concordant with the degree of cerebral volume loss. Patchy and confluent areas of decreased attenuation are noted throughout the deep and periventricular white matter of the cerebral hemispheres bilaterally, compatible with chronic microvascular ischemic disease. No evidence of large-territorial acute infarction. No parenchymal hemorrhage. No mass lesion. No extra-axial collection. No mass effect or midline shift. No hydrocephalus. Basilar cisterns are patent. Vascular: No hyperdense vessel. Skull: Limited evaluation for skull fracture due to motion artifact. Sinuses/Orbits: Paranasal sinuses and mastoid air cells are clear. The orbits are unremarkable. Other: None. IMPRESSION: No acute intracranial abnormality. Electronically Signed   By: Iven Finn M.D.   On: 01/02/2022 21:04    EKG: Independently reviewed. ?third-degree AV block, Definite disassociation of P wave and QRS complex.  New from previous.  QTc  492.  Nonspecific T wave abnormalities.   Assessment/Plan Principal Problem:   Seizure ([place]) Active Problems:   Hypomagnesemia   Diabetes (HCC)   Abnormal EKG   Hypokalemia  Seizure > Patient presenting after family witnessed an apparent grand mal seizure lasting a minute with 20 seconds unresponsiveness afterwards.  Was postictal following the event. > No known previous history of seizure.  Some concern for hypoglycemic seizure as blood sugar is in the 60s in the ED and patient does take insulin for diabetes.  However per report on the scene EMS checked his blood sugar and it was 112. > Neurology consulted by EDP. - Appreciate neurology recommendations - Continue with D10 drip started in the ED - Seizure precautions  Hypokalemia > Potassium noted to be significantly low 2.2 in the ED. > 40 mEq p.o. ordered - Continue with 40 mEq p.o. and add additional 60 mEq IV - We will plan for additional 40 mEq p.o. in the morning. - Check magnesium - Trend potassium - Goal of 4 or greater considering arrhythmia below  New ?third-degree AV block > EKG with apparent new third-degree AV block.  Previous EKG showed first-degree AV block. > Cardiology consulted and will see the patient, bili remains at P wave is dissociated secondary to underlying competing rhythm. > Not currently bradycardic nor symptomatic from this. - Appreciate cardiology recommendations - Repeat EKG - Monitor on telemetry - Echocardiogram - Check troponin   Diabetes - Holding 70/30 insulin for now given concern for hypoglycemia - On D10 drip as above - SSI  DVT prophylaxis: Lovenox  Code Status:   Full  Family Communication:  Daughter updated by phone  Disposition Plan:   Patient is from:  Home  Anticipated DC to:  Home  Anticipated DC date:  1 to 3 days  Anticipated DC barriers: None  Consults called:  Neurology, cardiology   Admission status:  Observation, Progressive   Severity of Illness: The  appropriate patient status  for this patient is OBSERVATION. Observation status is judged to be reasonable and necessary in order to provide the required intensity of service to ensure the patient's safety. The patient's presenting symptoms, physical exam findings, and initial radiographic and laboratory data in the context of their medical condition is felt to place them at decreased risk for further clinical deterioration. Furthermore, it is anticipated that the patient will be medically stable for discharge from the hospital within 2 midnights of admission.    Andre Bruins MD [facility]  How to contact the [facility] Attending or Consulting provider [place] or covering provider during after hours [place], for this patient?   Check the care team in [facility] and look for a) attending/consulting [facility] provider listed and b) the [facility] team listed Log into [email or web address] and use [place]'s universal password to access. If you do not have the password, please contact the hospital operator. Locate the [facility] provider you are looking for under [facility] and page to a number that you can be directly reached. If you still have difficulty reaching the provider, please page the [facility] (Director on Call) for the Hospitalists listed on amion for assistance.  01/02/2022, 11:06 PM

## 2022-01-03 ENCOUNTER — Observation Stay (HOSPITAL_COMMUNITY): Payer: Medicare Other

## 2022-01-03 DIAGNOSIS — I442 Atrioventricular block, complete: Secondary | ICD-10-CM

## 2022-01-03 DIAGNOSIS — Z833 Family history of diabetes mellitus: Secondary | ICD-10-CM | POA: Diagnosis not present

## 2022-01-03 DIAGNOSIS — R7989 Other specified abnormal findings of blood chemistry: Secondary | ICD-10-CM

## 2022-01-03 DIAGNOSIS — E1065 Type 1 diabetes mellitus with hyperglycemia: Secondary | ICD-10-CM | POA: Diagnosis not present

## 2022-01-03 DIAGNOSIS — H409 Unspecified glaucoma: Secondary | ICD-10-CM | POA: Diagnosis present

## 2022-01-03 DIAGNOSIS — M7989 Other specified soft tissue disorders: Secondary | ICD-10-CM | POA: Diagnosis present

## 2022-01-03 DIAGNOSIS — G40409 Other generalized epilepsy and epileptic syndromes, not intractable, without status epilepticus: Secondary | ICD-10-CM | POA: Diagnosis present

## 2022-01-03 DIAGNOSIS — Z794 Long term (current) use of insulin: Secondary | ICD-10-CM | POA: Diagnosis not present

## 2022-01-03 DIAGNOSIS — R55 Syncope and collapse: Secondary | ICD-10-CM

## 2022-01-03 DIAGNOSIS — E876 Hypokalemia: Secondary | ICD-10-CM | POA: Diagnosis present

## 2022-01-03 DIAGNOSIS — R197 Diarrhea, unspecified: Secondary | ICD-10-CM | POA: Diagnosis present

## 2022-01-03 DIAGNOSIS — Z87891 Personal history of nicotine dependence: Secondary | ICD-10-CM | POA: Diagnosis not present

## 2022-01-03 DIAGNOSIS — I441 Atrioventricular block, second degree: Secondary | ICD-10-CM | POA: Diagnosis present

## 2022-01-03 DIAGNOSIS — D72829 Elevated white blood cell count, unspecified: Secondary | ICD-10-CM | POA: Diagnosis present

## 2022-01-03 DIAGNOSIS — W07XXXA Fall from chair, initial encounter: Secondary | ICD-10-CM | POA: Diagnosis present

## 2022-01-03 DIAGNOSIS — E10649 Type 1 diabetes mellitus with hypoglycemia without coma: Secondary | ICD-10-CM | POA: Diagnosis present

## 2022-01-03 DIAGNOSIS — E875 Hyperkalemia: Secondary | ICD-10-CM | POA: Diagnosis present

## 2022-01-03 DIAGNOSIS — R569 Unspecified convulsions: Secondary | ICD-10-CM | POA: Diagnosis present

## 2022-01-03 DIAGNOSIS — Z6831 Body mass index (BMI) 31.0-31.9, adult: Secondary | ICD-10-CM | POA: Diagnosis not present

## 2022-01-03 DIAGNOSIS — W1830XA Fall on same level, unspecified, initial encounter: Secondary | ICD-10-CM | POA: Diagnosis present

## 2022-01-03 DIAGNOSIS — I10 Essential (primary) hypertension: Secondary | ICD-10-CM | POA: Diagnosis present

## 2022-01-03 DIAGNOSIS — I4589 Other specified conduction disorders: Secondary | ICD-10-CM | POA: Diagnosis present

## 2022-01-03 DIAGNOSIS — E669 Obesity, unspecified: Secondary | ICD-10-CM | POA: Diagnosis present

## 2022-01-03 DIAGNOSIS — I358 Other nonrheumatic aortic valve disorders: Secondary | ICD-10-CM | POA: Diagnosis present

## 2022-01-03 DIAGNOSIS — Z8546 Personal history of malignant neoplasm of prostate: Secondary | ICD-10-CM | POA: Diagnosis not present

## 2022-01-03 DIAGNOSIS — I248 Other forms of acute ischemic heart disease: Secondary | ICD-10-CM | POA: Diagnosis present

## 2022-01-03 DIAGNOSIS — Z20822 Contact with and (suspected) exposure to covid-19: Secondary | ICD-10-CM | POA: Diagnosis present

## 2022-01-03 LAB — BASIC METABOLIC PANEL
Anion gap: 11 (ref 5–15)
Anion gap: 6 (ref 5–15)
BUN: 5 mg/dL — ABNORMAL LOW (ref 8–23)
BUN: <5 mg/dL — ABNORMAL LOW (ref 8–23)
CO2: 24 mmol/L (ref 22–32)
CO2: 28 mmol/L (ref 22–32)
Calcium: 8.8 mg/dL — ABNORMAL LOW (ref 8.9–10.3)
Calcium: 8.8 mg/dL — ABNORMAL LOW (ref 8.9–10.3)
Chloride: 100 mmol/L (ref 98–111)
Chloride: 102 mmol/L (ref 98–111)
Creatinine, Ser: 0.97 mg/dL (ref 0.61–1.24)
Creatinine, Ser: 1.02 mg/dL (ref 0.61–1.24)
GFR, Estimated: >60 mL/min (ref >60)
GFR, Estimated: >60 mL/min (ref >60)
Glucose, Bld: 152 mg/dL — ABNORMAL HIGH (ref 70–99)
Glucose, Bld: 279 mg/dL — ABNORMAL HIGH (ref 70–99)
Potassium: 3.1 mmol/L — ABNORMAL LOW (ref 3.5–5.1)
Potassium: 3.5 mmol/L (ref 3.5–5.1)
Sodium: 135 mmol/L (ref 135–145)
Sodium: 136 mmol/L (ref 135–145)

## 2022-01-03 LAB — URINALYSIS, MICROSCOPIC (REFLEX): Bacteria, UA: NONE SEEN

## 2022-01-03 LAB — URINALYSIS, ROUTINE W REFLEX MICROSCOPIC
Bilirubin Urine: NEGATIVE
Glucose, UA: NEGATIVE mg/dL
Ketones, ur: NEGATIVE mg/dL
Leukocytes,Ua: NEGATIVE
Nitrite: NEGATIVE
Protein, ur: >300 mg/dL — AB
Specific Gravity, Urine: 1.025 (ref 1.005–1.030)
pH: 6.5 (ref 5.0–8.0)

## 2022-01-03 LAB — ECHOCARDIOGRAM COMPLETE
AR max vel: 2.67 cm2
AV Area VTI: 2.54 cm2
AV Area mean vel: 2.41 cm2
AV Mean grad: 1 mmHg
AV Peak grad: 2.4 mmHg
Ao pk vel: 0.78 m/s
Area-P 1/2: 3.03 cm2
Height: 65 in
S' Lateral: 2.7 cm
Weight: 3213.42 oz

## 2022-01-03 LAB — CBG MONITORING, ED
Glucose-Capillary: 150 mg/dL — ABNORMAL HIGH (ref 70–99)
Glucose-Capillary: 202 mg/dL — ABNORMAL HIGH (ref 70–99)
Glucose-Capillary: 387 mg/dL — ABNORMAL HIGH (ref 70–99)

## 2022-01-03 LAB — RESP PANEL BY RT-PCR (FLU A&B, COVID) ARPGX2
Influenza A by PCR: NEGATIVE
Influenza B by PCR: NEGATIVE
SARS Coronavirus 2 by RT PCR: NEGATIVE

## 2022-01-03 LAB — GLUCOSE, CAPILLARY
Glucose-Capillary: 303 mg/dL — ABNORMAL HIGH (ref 70–99)
Glucose-Capillary: 305 mg/dL — ABNORMAL HIGH (ref 70–99)

## 2022-01-03 LAB — CBC
HCT: 46.4 % (ref 39.0–52.0)
Hemoglobin: 15 g/dL (ref 13.0–17.0)
MCH: 27.6 pg (ref 26.0–34.0)
MCHC: 32.3 g/dL (ref 30.0–36.0)
MCV: 85.5 fL (ref 80.0–100.0)
Platelets: 261 10*3/uL (ref 150–400)
RBC: 5.43 MIL/uL (ref 4.22–5.81)
RDW: 13.9 % (ref 11.5–15.5)
WBC: 13.4 10*3/uL — ABNORMAL HIGH (ref 4.0–10.5)
nRBC: 0 % (ref 0.0–0.2)

## 2022-01-03 LAB — TROPONIN I (HIGH SENSITIVITY)
Troponin I (High Sensitivity): 125 ng/L (ref <18)
Troponin I (High Sensitivity): 235 ng/L (ref <18)
Troponin I (High Sensitivity): 1816 ng/L (ref <18)

## 2022-01-03 LAB — LIPID PANEL
Cholesterol: 185 mg/dL (ref 0–200)
HDL: 51 mg/dL (ref >40)
LDL Cholesterol: 104 mg/dL — ABNORMAL HIGH (ref 0–99)
Total CHOL/HDL Ratio: 3.6 RATIO
Triglycerides: 151 mg/dL — ABNORMAL HIGH (ref <150)
VLDL: 30 mg/dL (ref 0–40)

## 2022-01-03 LAB — HEMOGLOBIN A1C
Hgb A1c MFr Bld: 8.2 % — ABNORMAL HIGH (ref 4.8–5.6)
Mean Plasma Glucose: 188.64 mg/dL

## 2022-01-03 LAB — MAGNESIUM
Magnesium: 1.5 mg/dL — ABNORMAL LOW (ref 1.7–2.4)
Magnesium: 1.4 mg/dL — ABNORMAL LOW (ref 1.7–2.4)

## 2022-01-03 MED ORDER — INSULIN ASPART 100 UNIT/ML IJ SOLN
0.0000 [IU] | Freq: Three times a day (TID) | INTRAMUSCULAR | Status: DC
  Administered 2022-01-03: 5 [IU] via SUBCUTANEOUS
  Administered 2022-01-04: 2 [IU] via SUBCUTANEOUS
  Administered 2022-01-04: 6 [IU] via SUBCUTANEOUS
  Administered 2022-01-04: 3 [IU] via SUBCUTANEOUS
  Administered 2022-01-05 (×3): 1 [IU] via SUBCUTANEOUS

## 2022-01-03 MED ORDER — POTASSIUM CHLORIDE CRYS ER 20 MEQ PO TBCR: 40.0000 meq | EXTENDED_RELEASE_TABLET | Freq: Once | ORAL | Status: DC

## 2022-01-03 MED ORDER — POTASSIUM CHLORIDE CRYS ER 20 MEQ PO TBCR
40.0000 meq | EXTENDED_RELEASE_TABLET | Freq: Once | ORAL | Status: DC
  Filled 2022-01-03: qty 2

## 2022-01-03 MED ORDER — POTASSIUM CHLORIDE CRYS ER 20 MEQ PO TBCR
40.0000 meq | EXTENDED_RELEASE_TABLET | Freq: Once | ORAL | Status: AC
  Administered 2022-01-03: 40 meq via ORAL
  Filled 2022-01-03: qty 2

## 2022-01-03 MED ORDER — MAGNESIUM SULFATE 2 GM/50ML IV SOLN
2.0000 g | Freq: Once | INTRAVENOUS | Status: AC
  Administered 2022-01-03: 2 g via INTRAVENOUS
  Filled 2022-01-03: qty 50

## 2022-01-03 MED ORDER — ASPIRIN 81 MG PO CHEW
81.0000 mg | CHEWABLE_TABLET | Freq: Every day | ORAL | Status: DC
  Administered 2022-01-03 – 2022-01-05 (×3): 81 mg via ORAL
  Filled 2022-01-03 (×3): qty 1

## 2022-01-03 MED ORDER — ATORVASTATIN CALCIUM 40 MG PO TABS
40.0000 mg | ORAL_TABLET | Freq: Every day | ORAL | Status: DC
  Administered 2022-01-03 – 2022-01-05 (×3): 40 mg via ORAL
  Filled 2022-01-03 (×3): qty 1

## 2022-01-03 MED ORDER — HYDRALAZINE HCL 20 MG/ML IJ SOLN
10.0000 mg | Freq: Four times a day (QID) | INTRAMUSCULAR | Status: DC | PRN
  Administered 2022-01-03: 10 mg via INTRAVENOUS
  Filled 2022-01-03: qty 1

## 2022-01-03 MED ORDER — ASPIRIN 81 MG PO CHEW
81.0000 mg | CHEWABLE_TABLET | Freq: Once | ORAL | Status: DC
Start: 2022-01-03 — End: 2022-01-03

## 2022-01-03 MED ORDER — ENOXAPARIN SODIUM 40 MG/0.4ML IJ SOSY
40.0000 mg | PREFILLED_SYRINGE | INTRAMUSCULAR | Status: DC
  Administered 2022-01-03 – 2022-01-05 (×3): 40 mg via SUBCUTANEOUS
  Filled 2022-01-03 (×3): qty 0.4

## 2022-01-03 MED ORDER — DEXTROSE 5 % IV SOLN: INTRAVENOUS | Status: DC

## 2022-01-03 NOTE — ED Notes (Signed)
Pt returned from echo with IV dislodged.  Unsuccessful attempt to re-establish IV access x2,  IV team consult requested

## 2022-01-03 NOTE — ED Notes (Signed)
Re-educated pt on maintaining optimal arm position for infusion. Pt verbalized understanding.

## 2022-01-03 NOTE — ED Notes (Signed)
Provider at bedside

## 2022-01-03 NOTE — Progress Notes (Signed)
PROGRESS NOTE    Andre Stout  AGT:[id number] DOB: 1940/10/26 DOA: 01/02/2022 PCP: [name], FNP   Chief Complain: Witnessed seizure  Brief Narrative: Patient is a 82 year old male with history of  prostatic cancer, diabetes type 2 who presented with seizure from home.  On presentation he was post ictal and does not remember the event.  He was witnessed by family to have grand mal seizure lasting about a minute and was unresponsive for around 20 minutes after that.  No known history of seizure disorder.  On presentation he was hypoglycemic, hypokalemic, lab work showed mild leukocytosis.  CT head did not show any acute intracranial abnormalities.  Patient was given magnesium, potassium and was started on D10 drip.  Lab work also showed elevated troponin though patient denied any chest pain,EKG showed A-V dissociation suspicious for complete heart block.  Cardiology  consulted, being considered for pacemaker placement.  Assessment & Plan:   Principal Problem:   Seizure ([place]) Active Problems:   Hypomagnesemia   Diabetes (HCC)   Abnormal EKG   Hypokalemia   New onset seizure: No history of seizures in the past.  Brought because he had an event of grand mal seizure and postictal episode. Blood sugar was low on presentation, so hypoglycemia induced seizure suspected.  Currently he is completely alert and oriented.  Seizure precaution. CT head did not show any acute intracranial abnormalities. Case was discussed with neurology in the emergency department.  No further work-up or AED recommended for single episode of seizure induced by hypoglycemia  Hypokalemia/hypomagnesemia: Being supplemented and monitored.  Abnormal EKG/R/O 3rd degreee AV block/elevated troponin: Cardio consulted and following.  EKG was suspicious for A-V dissociation.  Likely secondary to electrolyte abnormalities.  Monitor on telemetry.  Currently asymptomatic.  Heart rate in the range of 70s.  Echo showed  EF of 55 to 22%, grade 1 diastolic dysfunction.  Denies any chest pain.  Cardiology considering pacemaker placement.  EP following  Diabetes type 2: Takes insulin at home.  Hypoglycemic on arrival.  Started on  D10 .A1c of 8.2.We  will consult diabetic coordinator. Blood sugars have been stable so we will change the fluid to D5 for now, can discontinue tomorrow after stabilization on blood sugars.  Continue sliding scale insulin.  Hypertension: Blood pressure on the higher side.  Does not take any medication at home.  Continue as needed medications for now.  If consistently hypertensive, can consider adding antihypertensives on discharge  Leukocytosis: Mild.  Most likely reactive.  Continue to monitor.  Deconditioning: We will request PT/OT evaluation after definitive plan for pacemaker.  Lives with family.  Ambulates with the help of cane.        DVT prophylaxis:Lovenox Code Status: Full Family Communication: Discussed with daughter on the phone today Patient status:Obs  Dispo: The patient is from: Home              Anticipated d/c is to: Home              Anticipated d/c date is: In next 1 to 2 days, pending cardiology decision pacemaker placement  Consultants: Cardiology, neurology  Procedures: None  Antimicrobials:  Anti-infectives (From admission, onward)    None       Subjective: Patient seen and examined at the bedside this afternoon in the emergency department.  Hemodynamically stable.  Heart rate was in the range of 70-80 on monitor.  Blood sugars are better.  He is completely alert and oriented.  Denies  any complaints.  Objective: Vitals:   01/02/22 2206 01/03/22 0110 01/03/22 0500 01/03/22 0630  BP: 139/82 (!) 137/51 (!) 169/94 (!) 168/101  Pulse: 63 65 80 80  Resp: _0 Temp:      TempSrc:      SpO2: 96% 96% 98% 97%  Weight:      Height:       No intake or output data in the 24 hours ending 01/03/22 0730 Filed Weights   01/02/22 1918  Weight:  91.1 kg    Examination:  General exam: Overall comfortable, not in distress, pleasant elderly male HEENT: PERRL Respiratory system:  no wheezes or crackles  Cardiovascular system: S1 & S2 heard, RRR.  Gastrointestinal system: Abdomen is nondistended, soft and nontender. Central nervous system: Alert and oriented Extremities: No edema, no clubbing ,no cyanosis Skin: No rashes, no ulcers,no icterus      Data Reviewed: I have personally reviewed following labs and imaging studies  CBC: Recent Labs  Lab 01/02/22 2007 01/02/22 2008 01/03/22 0025  WBC  --  11.2* 13.4*  NEUTROABS  --  7.6  --   HGB 15.0 14.2 15.0  HCT 44.0 44.1 46.4  MCV  --  85.8 85.5  PLT  --  226 709   Basic Metabolic Panel: Recent Labs  Lab 01/02/22 2007 01/02/22 2008 01/03/22 0025  NA 139 139 135  K 2.4* 2.2* 3.1*  CL  --  108 100  CO2  --  21* 24  GLUCOSE  --  69* 152*  BUN  --  6* 5*  CREATININE  --  0.83 1.02  CALCIUM  --  7.4* 8.8*  MG  --   --  1.5*   GFR: Estimated Creatinine Clearance: 58.9 mL/min (by C-G formula based on SCr of 1.02 mg/dL). Liver Function Tests: Recent Labs  Lab 01/02/22 2008  AST 14*  ALT 5  ALKPHOS 50  BILITOT 1.0  PROT 5.2*  ALBUMIN 2.4*   No results for input(s): LIPASE, AMYLASE in the last 168 hours. No results for input(s): AMMONIA in the last 168 hours. Coagulation Profile: No results for input(s): INR, PROTIME in the last 168 hours. Cardiac Enzymes: No results for input(s): CKTOTAL, CKMB, CKMBINDEX, TROPONINI in the last 168 hours. BNP (last 3 results) No results for input(s): PROBNP in the last 8760 hours. HbA1C: No results for input(s): HGBA1C in the last 72 hours. CBG: Recent Labs  Lab 01/02/22 2207 01/02/22 2249 01/03/22 0306  GLUCAP 65* 154* 150*   Lipid Profile: No results for input(s): CHOL, HDL, LDLCALC, TRIG, CHOLHDL, LDLDIRECT in the last 72 hours. Thyroid Function Tests: Recent Labs    01/02/22 2008  TSH 4.218   Anemia  Panel: No results for input(s): VITAMINB12, FOLATE, FERRITIN, TIBC, IRON, RETICCTPCT in the last 72 hours. Sepsis Labs: No results for input(s): PROCALCITON, LATICACIDVEN in the last 168 hours.  Recent Results (from the past 240 hour(s))  Resp Panel by RT-PCR (Flu A&B, Covid) Nasopharyngeal Swab     Status: None   Collection Time: 01/03/22 12:59 AM   Specimen: Nasopharyngeal Swab; Nasopharyngeal(NP) swabs in vial transport medium  Result Value Ref Range Status   SARS Coronavirus 2 by RT PCR NEGATIVE NEGATIVE Final    Comment: (NOTE) SARS-CoV-2 target nucleic acids are NOT DETECTED.  The SARS-CoV-2 RNA is generally detectable in upper respiratory specimens during the acute phase of infection. The lowest concentration of SARS-CoV-2 viral copies this assay can detect is 138 copies/mL. A negative  result does not preclude SARS-Cov-2 infection and should not be used as the sole basis for treatment or other patient management decisions. A negative result may occur with  improper specimen collection/handling, submission of specimen other than nasopharyngeal swab, presence of viral mutation(s) within the areas targeted by this assay, and inadequate number of viral copies(<138 copies/mL). A negative result must be combined with clinical observations, patient history, and epidemiological information. The expected result is Negative.  Fact Sheet for Patients:  EntrepreneurPulse.com.au  Fact Sheet for Healthcare Providers:  IncredibleEmployment.be  This test is no t yet approved or cleared by the [place] FDA and  has been authorized for detection and/or diagnosis of SARS-CoV-2 by FDA under an Emergency Use Authorization (EUA). This EUA will remain  in effect (meaning this test can be used) for the duration of the COVID-19 declaration under Section 564(b)(1) of the Act, 21 U.S.C.section 360bbb-3(b)(1), unless the authorization is terminated  or  revoked sooner.       Influenza A by PCR NEGATIVE NEGATIVE Final   Influenza B by PCR NEGATIVE NEGATIVE Final    Comment: (NOTE) The Xpert Xpress SARS-CoV-2/FLU/RSV plus assay is intended as an aid in the diagnosis of influenza from Nasopharyngeal swab specimens and should not be used as a sole basis for treatment. Nasal washings and aspirates are unacceptable for Xpert Xpress SARS-CoV-2/FLU/RSV testing.  Fact Sheet for Patients: EntrepreneurPulse.com.au  Fact Sheet for Healthcare Providers: IncredibleEmployment.be  This test is not yet approved or cleared by the [place] FDA and has been authorized for detection and/or diagnosis of SARS-CoV-2 by FDA under an Emergency Use Authorization (EUA). This EUA will remain in effect (meaning this test can be used) for the duration of the COVID-19 declaration under Section 564(b)(1) of the Act, 21 U.S.C. section 360bbb-3(b)(1), unless the authorization is terminated or revoked.  Performed at [facility], [place] [address], [place], [place] [address]          Radiology Studies: CT Head Wo Contrast  Result Date: 01/02/2022 CLINICAL DATA:  Seizure disorder, clinical change EXAM: CT HEAD WITHOUT CONTRAST TECHNIQUE: Contiguous axial images were obtained from the base of the skull through the vertex without intravenous contrast. COMPARISON:  CT head 10/09/2020 BRAIN: BRAIN Cerebral ventricle sizes are concordant with the degree of cerebral volume loss. Patchy and confluent areas of decreased attenuation are noted throughout the deep and periventricular white matter of the cerebral hemispheres bilaterally, compatible with chronic microvascular ischemic disease. No evidence of large-territorial acute infarction. No parenchymal hemorrhage. No mass lesion. No extra-axial collection. No mass effect or midline shift. No hydrocephalus. Basilar cisterns are patent. Vascular: No hyperdense vessel. Skull:  Limited evaluation for skull fracture due to motion artifact. Sinuses/Orbits: Paranasal sinuses and mastoid air cells are clear. The orbits are unremarkable. Other: None. IMPRESSION: No acute intracranial abnormality. Electronically Signed   By: Iven Finn M.D.   On: 01/02/2022 21:04        Scheduled Meds:  chlorhexidine gluconate (MEDLINE KIT)  15 mL Mouth Rinse BID   enoxaparin (LOVENOX) injection  40 mg Subcutaneous Q24H   mouth rinse  15 mL Mouth Rinse 10 times per day   [START ON 01/04/2022] potassium chloride  40 mEq Oral Once   sodium chloride flush  3 mL Intravenous Q12H   Continuous Infusions:  dextrose 75 mL/hr at 01/02/22 2218     LOS: 0 days    Time spent: More than 50% of that time was spent in counseling and/or coordination of  care.      Shelly Coss, MD [facility] P1/03/2022, 7:30 AM

## 2022-01-03 NOTE — Consult Note (Addendum)
Cardiology Consultation:   Patient ID: Andre Stout MRN: [id number]; DOB: 1940/02/01  Admit date: 01/02/2022 Date of Consult: 01/03/2022  PCP:  Andre Stout, [place] [facility] Cardiologist:  new to [facility]   Patient Profile:   Andre Stout is a 82 y.o. male with a hx of prostate Ca, HTN, DM who is being seen 01/03/2022 for the evaluation of CHB at the request of Dr. Marisue Stout.  History of Present Illness:   Andre Stout was brought to [facility] yesterday after a witnessed syncopal event suspected to be a siezure by family (with no hx of this), total time of event was estimated a minute by family and pt upon waking remained confused. EMS was called Chart review reports that BS by EMS was 112 > 69 at the ER K+ 2.2, other lab abnormalities noted were  bicarb 21, glucose 69, calcium 7.4, protein 5.2, albumin 2.4.  CBC with mild leukocytosis to 11.2 By H&P treated with mag and 40 mEq p.o. potassium, D50, D10 drip.  Started on D10 and D50 after initial blood sugar was 69 and then reduced to 65 after OJ  Neurology called, cardiology also for what appeared to be CHB as well on EKG K+ continued to be replaced  Cardiology, no nodal blocking agents noted, telemetry was reported SR, though confirmed CHB on his EKGs with narrow QRS, patient at the time without symptoms and hemodynamically stable Recommended K+ repletion and monitoring with further cardiology follow up and management.  Given his syncopal event and hx of syncope, EP is asked to weigh in  LABS K+ 2.4, 2.2 3.1 Mag 1.5 BUN/Creat 6/0.83 > 5/1.02 Albumin 2.4 HS Trop 125, 235 WBC 11.2 > 13.4 H/H 15/46 Plts 261 TSH 4.218  The patient is vague in his reports of prior syncope, 2 other times perhaps in the last several months (prior reports of once a year ago), but without warning and brief. He does not recall this event clearly No CP, palpitations or cardiac awareness, no SOB, + reports of waxing/waning LE  swelling  Past Medical History:  Diagnosis Date   Diabetes mellitus without complication (HCC)    Glaucoma    Prostate cancer ([place])    Been 3-4 years ago    Past Surgical History:  Procedure Laterality Date   CATARACT EXTRACTION Bilateral      Home Medications:  Prior to Admission medications   Medication Sig Start Date End Date Taking? Authorizing Provider  brimonidine (ALPHAGAN) 0.2 % ophthalmic solution Place 1 drop into both eyes in the morning and at bedtime. 12/18/21  Yes [provider]  Cholecalciferol (VITAMIN D3) 2000 UNITS TABS Take 2,000 Units by mouth daily.   Yes [provider]  COMBIGAN 0.2-0.5 % ophthalmic solution Place 1 drop into both eyes 2 (two) times daily. 12/19/15  Yes [provider]  insulin NPH-regular Human (HUMULIN 70/30) (70-30) 100 UNIT/ML injection Inject 36 Units into the skin 2 (two) times daily with a meal. With breakfast and with supper. Patient taking differently: Inject 36 Units into the skin 2 (two) times daily with a meal. 8am and 5pm 12/06/21  Yes Renato Shin, MD  latanoprost (XALATAN) 0.005 % ophthalmic solution Place 1 drop into both eyes at bedtime. 01/01/22  Yes [provider]  Misc Natural Products (URINOZINC PO) Take 1 tablet by mouth daily.    Yes [provider]  Multiple Vitamins-Minerals (CENTRUM ADULTS PO) Take 1 tablet by mouth daily.    Yes [provider]  Omega 3 1200 MG CAPS Take 1,200 mg by mouth daily.   Yes [provider]  BD INSULIN SYRINGE U/F 31G X 5/16" 1 ML MISC USE 2 DAILY 08/24/21   Renato Shin, MD  [facility] DELICA LANCETS 43H MISC Use to check blood sugar 4 times per day. Dx code: E11.9 03/27/16   Renato Shin, MD  [facility] ULTRA test strip USE TO MONITOR GLUCOSE LEVELS 4 TIMES PER DAY E11.9 06/10/19   Renato Shin, MD    Inpatient Medications: Scheduled Meds:  chlorhexidine gluconate (MEDLINE KIT)  15 mL Mouth Rinse BID   mouth rinse  15 mL Mouth  Rinse 10 times per day   potassium chloride  40 mEq Oral Once   sodium chloride flush  3 mL Intravenous Q12H   Continuous Infusions:  dextrose 75 mL/hr at 01/02/22 2218   PRN Meds: acetaminophen **OR** acetaminophen, polyethylene glycol  Allergies:   No Known Allergies  Social History:   Social History   Socioeconomic History   Marital status: Widowed    Spouse name: Not on file   Number of children: 1   Years of education: 16   Highest education level: Not on file  Occupational History   Occupation: Retired  Tobacco Use   Smoking status: Former    Packs/day: 0.50    Years: 4.00    Pack years: 2.00    Types: Cigarettes    Quit date: 10/29/1974    Years since quitting: 47.2   Smokeless tobacco: Never  Vaping Use   Vaping Use: Never used  Substance and Sexual Activity   Alcohol use: No   Drug use: No   Sexual activity: Not Currently  Other Topics Concern   Not on file  Social History Narrative   Born and raised in [place], [place]. Currently reside in a private residence by himself. Daughter lives in the area. No live. Fun: hunt and fish   Denies religious beliefs that would effect health care.    Social Determinants of Health   Financial Resource Strain: Not on file  Food Insecurity: Not on file  Transportation Needs: Not on file  Physical Activity: Not on file  Stress: Not on file  Social Connections: Not on file  Intimate Partner Violence: Not on file    Family History:   Family History  Problem Relation Age of Onset   Diabetes Father    Diabetes Paternal Grandfather      ROS:  Please see the history of present illness.  All other ROS reviewed and negative.     Physical Exam/Data:   Vitals:   01/03/22 0110 01/03/22 0500 01/03/22 0630 01/03/22 0800  BP: (!) 137/51 (!) 169/94 (!) 168/101 (!) 162/79  Pulse: 65 80 80 66  Resp: 15 15  16   Temp:      TempSrc:      SpO2: 96% 98% 97% 98%  Weight:      Height:       No intake or output data in the 24  hours ending 01/03/22 1057 Last 3 Weights 01/02/2022 12/06/2021 11/17/2021  Weight (lbs) 200 lb 13.4 oz 200 lb 12.8 oz 196 lb 3.2 oz  Weight (kg) 91.1 kg 91.082 kg 88.996 kg     Body mass index is 33.42 kg/m.  General:  Well nourished, well developed, in no acute distress HEENT: normal Neck: no JVD Vascular: No carotid bruits; Distal pulses 2+ bilaterally Cardiac:  RRR; no murmurs, gallops or rubs Lungs:  CTA b/l,  no wheezing, rhonchi or rales  Abd: soft, nontender  Ext: trace edema Musculoskeletal:  No deformities Skin: warm and dry  Neuro:  no gross focal abnormalities noted Psych:  Normal affect   EKG:  The EKG was personally reviewed and demonstrates:   CHB, 71bpm, QRS 141ms CHB 67bpm, QRS 168ms  OLD 10/09/20: SR 89bpm, 1st degree AVblock 280-34ms  Telemetry:  Telemetry was personally reviewed and demonstrates:   Currently conducting 1:1, SR 70's-80's 1st degree AVblock  Relevant CV Studies:  01/03/22: TTE IMPRESSIONS   1. Left ventricular ejection fraction, by estimation, is 55 to 60%. The  left ventricle has normal function. Left ventricular endocardial border  not optimally defined to evaluate regional wall motion. Appears grossly  normal. There is mild-to-moderate  hypertrophy of the basal septum. The rest of the LV segments demonstrate  mild left ventricular hypertrophy. Left ventricular diastolic parameters  are consistent with Grade I diastolic dysfunction (impaired relaxation).   2. Right ventricular systolic function is normal. The right ventricular  size is normal.   3. The mitral valve is normal in structure. No evidence of mitral valve  regurgitation. No evidence of mitral stenosis.   4. The aortic valve is tricuspid. There is mild calcification of the  aortic valve. There is mild thickening of the aortic valve. Aortic valve  regurgitation is not visualized. Aortic valve sclerosis/calcification is  present, without any evidence of  aortic stenosis.   5.  The inferior vena cava is normal in size with greater than 50%  respiratory variability, suggesting right atrial pressure of 3 mmHg.   Comparison(s): Compared to prior echo report in 09/2020, there is no   Laboratory Data:  High Sensitivity Troponin:   Recent Labs  Lab 01/03/22 0025 01/03/22 0324  TROPONINIHS 125* 235*     Chemistry Recent Labs  Lab 01/02/22 2007 01/02/22 2008 01/03/22 0025  NA 139 139 135  K 2.4* 2.2* 3.1*  CL  --  108 100  CO2  --  21* 24  GLUCOSE  --  69* 152*  BUN  --  6* 5*  CREATININE  --  0.83 1.02  CALCIUM  --  7.4* 8.8*  MG  --   --  1.5*  GFRNONAA  --  >60 >60  ANIONGAP  --  10 11    Recent Labs  Lab 01/02/22 2008  PROT 5.2*  ALBUMIN 2.4*  AST 14*  ALT 5  ALKPHOS 50  BILITOT 1.0   Lipids No results for input(s): CHOL, TRIG, HDL, LABVLDL, LDLCALC, CHOLHDL in the last 168 hours.  Hematology Recent Labs  Lab 01/02/22 2007 01/02/22 2008 01/03/22 0025  WBC  --  11.2* 13.4*  RBC  --  5.14 5.43  HGB 15.0 14.2 15.0  HCT 44.0 44.1 46.4  MCV  --  85.8 85.5  MCH  --  27.6 27.6  MCHC  --  32.2 32.3  RDW  --  13.7 13.9  PLT  --  226 261   Thyroid  Recent Labs  Lab 01/02/22 2008  TSH 4.218    BNPNo results for input(s): BNP, PROBNP in the last 168 hours.  DDimer No results for input(s): DDIMER in the last 168 hours.   Radiology/Studies:  CT Head Wo Contrast Result Date: 01/02/2022 CLINICAL DATA:  Seizure disorder, clinical change EXAM: CT HEAD WITHOUT CONTRAST TECHNIQUE: Contiguous axial images were obtained from the base of the skull through the vertex without intravenous contrast. COMPARISON:  CT head 10/09/2020 BRAIN: BRAIN Cerebral  ventricle sizes are concordant with the degree of cerebral volume loss. Patchy and confluent areas of decreased attenuation are noted throughout the deep and periventricular white matter of the cerebral hemispheres bilaterally, compatible with chronic microvascular ischemic disease. No evidence of  large-territorial acute infarction. No parenchymal hemorrhage. No mass lesion. No extra-axial collection. No mass effect or midline shift. No hydrocephalus. Basilar cisterns are patent. Vascular: No hyperdense vessel. Skull: Limited evaluation for skull fracture due to motion artifact. Sinuses/Orbits: Paranasal sinuses and mastoid air cells are clear. The orbits are unremarkable. Other: None. IMPRESSION: No acute intracranial abnormality. Electronically Signed   By: Iven Finn M.D.   On: 01/02/2022 21:04     Assessment and Plan:   Syncope No warning and sounds of cardiac etiology ? Seizure , chart notes mention perhaps 2/2 hypoglycemia 2. CHB Though his HRs at least here were never slow He reports prior syncope  Marked hypokalemia at this juncture a possible reversible cause Mag just checked this AM also low Both being addressed by attending team  He has had loose stools for a few days but not diarrhea No on any nodal blocking agent or meds that would deplete his K+ that I see  His BUN is only 5, and albumin low 2.4, and admits to not a great diet in the way of fruits/vegetables at least.  He has regained 1:1 conduction If he has more CHB despite electrolyte normalization he will need pacer, if not perhaps home with monitoring once ready otherwise. We will continue to follow  Dr. Lovena Le has seen and examined the patient  Risk Assessment/Risk Scores:    For questions or updates, please contact [facility] Please consult [email or web address] for contact info under    Signed, Baldwin Jamaica, PA-C  01/03/2022 10:57 AM  EP attending  Patient seen and examined.  Agree with the findings as noted above.  The patient is a very pleasant man with a history of profound hypokalemia who presented with weakness and was found to have transient complete heart block.  His electrolytes have been repleted and his conduction has normalized.  He does have first-degree heart block.  He has a  history of syncope associated with this episode.  He is on no AV nodal blocking drugs.  On exam he is a pleasant elderly man in no distress.  Vitals are as noted above.  The lungs are clear bilaterally.  The cardiovascular exam with regular rate and rhythm.  Extremities demonstrated trace edema.  Abdominal exam was benign.  Telemetry demonstrates sinus rhythm.  Assessment and plan 1.  Transient complete heart block -while he does have some conduction system disease, his heart block has resolved with repletion of potassium and his other electrolytes.  I would recommend watchful waiting.  He can be discharged tomorrow if he has no more heart block.  Would avoid all AV nodal blocking drugs and frequently check his electrolytes as an outpatient. 2.  Syncope -the etiology is unclear.  He certainly could have had long episodes of asystole in the setting of complete heart block although we have no documentation of this.  I would recommend as an outpatient that he wear a 14-day ZIO monitor.  Cristopher Peru, MD

## 2022-01-03 NOTE — Progress Notes (Addendum)
°Cardiology Progress Note  °Patient ID: Andre Stout °MRN: [id number] °DOB: 03/28/1940 °Date of Encounter: 01/03/2022 ° °Primary Cardiologist: None ° °Subjective  ° °Chief Complaint: None.  ° °HPI: Admitted with syncope and possible seizure. EKG with CHB on arrival. Now in NSR with 1AVB. Denies CP or SOB. Report syncope 1 year ago. K improving.  ° °ROS:  °All other ROS reviewed and negative. Pertinent positives noted in the HPI.    ° °Inpatient Medications  °Scheduled Meds: ° chlorhexidine gluconate (MEDLINE KIT)  15 mL Mouth Rinse BID  ° enoxaparin (LOVENOX) injection  40 mg Subcutaneous Q24H  ° mouth rinse  15 mL Mouth Rinse 10 times per day  ° potassium chloride  40 mEq Oral Once  ° sodium chloride flush  3 mL Intravenous Q12H  ° °Continuous Infusions: ° dextrose 75 mL/hr at 01/02/22 2218  ° magnesium sulfate bolus IVPB    ° °PRN Meds: °acetaminophen **OR** acetaminophen, polyethylene glycol  ° °Vital Signs  ° °Vitals:  ° 01/02/22 2206 01/03/22 0110 01/03/22 0500 01/03/22 0630  °BP: 139/82 (!) 137/51 (!) 169/94 (!) 168/101  °Pulse: 63 65 80 80  °Resp: 17 15 15   °Temp:      °TempSrc:      °SpO2: 96% 96% 98% 97%  °Weight:      °Height:      ° °No intake or output data in the 24 hours ending 01/03/22 0752 °Last 3 Weights 01/02/2022 12/06/2021 11/17/2021  °Weight (lbs) 200 lb 13.4 oz 200 lb 12.8 oz 196 lb 3.2 oz  °Weight (kg) 91.1 kg 91.082 kg 88.996 kg  °   ° °Telemetry  °Overnight telemetry shows SR 70s, 1AVB, intermitted Wenckebach, which I personally reviewed.  ° °ECG  °The most recent ECG shows CHB with junctional escape 71 bpm, which I personally reviewed.  ° °Physical Exam  ° °Vitals:  ° 01/02/22 2206 01/03/22 0110 01/03/22 0500 01/03/22 0630  °BP: 139/82 (!) 137/51 (!) 169/94 (!) 168/101  °Pulse: 63 65 80 80  °Resp: 17 15 15   °Temp:      °TempSrc:      °SpO2: 96% 96% 98% 97%  °Weight:      °Height:      ° No intake or output data in the 24 hours ending 01/03/22 0752  °Last 3 Weights 01/02/2022 12/06/2021  11/17/2021  °Weight (lbs) 200 lb 13.4 oz 200 lb 12.8 oz 196 lb 3.2 oz  °Weight (kg) 91.1 kg 91.082 kg 88.996 kg  °  Body mass index is 33.42 kg/m².  °General: Well nourished, well developed, in no acute distress °Head: Atraumatic, normal size  °Eyes: PEERLA, EOMI  °Neck: Supple, no JVD °Endocrine: No thryomegaly °Cardiac: Normal S1, S2; RRR; no murmurs, rubs, or gallops °Lungs: Clear to auscultation bilaterally, no wheezing, rhonchi or rales  °Abd: Soft, nontender, no hepatomegaly  °Ext: No edema, pulses 2+ °Musculoskeletal: No deformities, BUE and BLE strength normal and equal °Skin: Warm and dry, no rashes   °Neuro: Alert and oriented to person, place, time, and situation, CNII-XII grossly intact, no focal deficits  °Psych: Normal mood and affect  ° °Labs  °High Sensitivity Troponin:   °Recent Labs  °Lab 01/03/22 °0025 01/03/22 °0324  °TROPONINIHS 125* 235*  °   °Cardiac EnzymesNo results for input(s): TROPONINI in the last 168 hours. No results for input(s): TROPIPOC in the last 168 hours.  °Chemistry °Recent Labs  °Lab 01/02/22 °2007 01/02/22 °2008 01/03/22 °0025  °NA 139 139 135  °K   2.4* 2.2* 3.1*  °CL  --  108 100  °CO2  --  21* 24  °GLUCOSE  --  69* 152*  °BUN  --  6* 5*  °CREATININE  --  0.83 1.02  °CALCIUM  --  7.4* 8.8*  °PROT  --  5.2*  --   °ALBUMIN  --  2.4*  --   °AST  --  14*  --   °ALT  --  5  --   °ALKPHOS  --  50  --   °BILITOT  --  1.0  --   °GFRNONAA  --  >60 >60  °ANIONGAP  --  10 11  °  °Hematology °Recent Labs  °Lab 01/02/22 °2007 01/02/22 °2008 01/03/22 °0025  °WBC  --  11.2* 13.4*  °RBC  --  5.14 5.43  °HGB 15.0 14.2 15.0  °HCT 44.0 44.1 46.4  °MCV  --  85.8 85.5  °MCH  --  27.6 27.6  °MCHC  --  32.2 32.3  °RDW  --  13.7 13.9  °PLT  --  226 261  ° °BNPNo results for input(s): BNP, PROBNP in the last 168 hours.  °DDimer No results for input(s): DDIMER in the last 168 hours.  ° °Radiology  °CT Head Wo Contrast ° °Result Date: 01/02/2022 °CLINICAL DATA:  Seizure disorder, clinical change EXAM: CT  HEAD WITHOUT CONTRAST TECHNIQUE: Contiguous axial images were obtained from the base of the skull through the vertex without intravenous contrast. COMPARISON:  CT head 10/09/2020 BRAIN: BRAIN Cerebral ventricle sizes are concordant with the degree of cerebral volume loss. Patchy and confluent areas of decreased attenuation are noted throughout the deep and periventricular white matter of the cerebral hemispheres bilaterally, compatible with chronic microvascular ischemic disease. No evidence of large-territorial acute infarction. No parenchymal hemorrhage. No mass lesion. No extra-axial collection. No mass effect or midline shift. No hydrocephalus. Basilar cisterns are patent. Vascular: No hyperdense vessel. Skull: Limited evaluation for skull fracture due to motion artifact. Sinuses/Orbits: Paranasal sinuses and mastoid air cells are clear. The orbits are unremarkable. Other: None. IMPRESSION: No acute intracranial abnormality. Electronically Signed   By: Morgane  Naveau M.D.   On: 01/02/2022 21:04   ° °Cardiac Studies  °TTE 10/10/2020 ° 1. Left ventricular ejection fraction, by estimation, is 60 to 65%. The  °left ventricle has normal function. The left ventricle has no regional  °wall motion abnormalities. Left ventricular diastolic parameters were  °normal.  ° 2. Right ventricular systolic function is normal. The right ventricular  °size is normal. Tricuspid regurgitation signal is inadequate for assessing  °PA pressure.  ° 3. The mitral valve is normal in structure. Trivial mitral valve  °regurgitation. No evidence of mitral stenosis.  ° 4. The aortic valve is tricuspid. There is mild calcification of the  °aortic valve. Aortic valve regurgitation is not visualized. No aortic  °stenosis is present.  ° 5. The inferior vena cava is normal in size with greater than 50%  °respiratory variability, suggesting right atrial pressure of 3 mmHg. ° °Patient Profile  °Andre Stout is a 81 y.o. male with diabetes,  hypertension, prostate cancer who was admitted on 01/03/2022 after syncopal event with possible seizure.  EKG showed complete heart block on arrival to the emergency room. ° °Assessment & Plan  ° °#Syncope °#Complete Heart Block °-Admitted with syncopal event.  Seizure-like activity reported.  CT head negative.  Severely hypokalemic on arrival.  This is being corrected. °-Initial EKG with complete heart block with junctional   junctional escape at 71 bpm. -EKG and telemetry now shows sinus rhythm with prolonged first-degree AV block.  He has had intermittent episodes of Wenckebach on telemetry.  No further symptoms. -TSH 4.2. -Initial potassium 2.2.  Being replaced. -Serum glucose 69.  A1c 8.8 so this could be low for him. CT head negative.  No history of seizure disorder.  Neurology has communicated to hospital medicine that I believe his seizure was hypoglycemic in nature.  They have no further recommendations for antiepileptic medications. -Troponin values minimally elevated and flat. -Echo in 2021 was normal.  Repeat echo is pending. -Overall have concerns that complete heart block and hypoglycemia could explain his syncopal episode.  CT head is negative.  Unclear what to make of electrolytes.  No evidence of medications that would do this other than possible insulin. -Do not believe this is an acute coronary syndrome.  He denies chest pain or trouble breathing.  I believe he may ultimately need a pacemaker.  Would keep NPO.  Would stop heparin products and pursue SCDs for DVT prophylaxis.  We will have EP see him today. -Formal echocardiogram is pending.  #Elevated Troponin, demand in setting of possible seizure -Denies chest pain or trouble breathing.  Had none of this prior to the episode.  I believe this is all secondary in the setting of seizure.  This could clearly explain that. -We will continue with supportive care for now.  Echo is pending. -We will check lipids.  Should be on statin due to diabetes  anyway.  For questions or updates, please contact [facility] Please consult [email or web address] for contact info under   Time Spent with Patient: I have spent a total of 25 minutes with patient reviewing hospital notes, telemetry, EKGs, labs and examining the patient as well as establishing an assessment and plan that was discussed with the patient.  > 50% of time was spent in direct patient care.    Signed, Addison Naegeli. Audie Box, MD, [place]  01/03/2022 7:52 AM

## 2022-01-03 NOTE — ED Notes (Signed)
Patient transported to echo ?

## 2022-01-03 NOTE — Progress Notes (Addendum)
° °  Inpatient Diabetes Program Recommendations  AACE/ADA: New Consensus Statement on Inpatient Glycemic Control (2015)  Target Ranges:  Prepandial:   less than 140 mg/dL      Peak postprandial:   less than 180 mg/dL (1-2 hours)      Critically ill patients:  140 - 180 mg/dL   Lab Results  Component Value Date   GLUCAP 202 (H) 01/03/2022   HGBA1C 8.2 (H) 01/03/2022    Review of Glycemic Control  Diabetes history: DM Outpatient Diabetes medications:  Novolin 70/30 36 units bid Current orders for Inpatient glycemic control:  Novolog 0-6 units tid with meals  Referral received.  Spoke to patient regarding Low blood sugars.  He state that this has happened 3-4 times in the last few years.  We discussed that the insulin he takes is 70/30 insulin and should be taken with food and on a consistent schedule.  He admits that he took his insulin and then did not eat.  We discussed how that can be dangerous and the dangers of low blood sugars.  Gave him handout that can be placed on his refrigerator that reviews signs, symptoms and treatment of low blood sugars.    Reminded patient that he needs to eat with 70/30 insulin. Patient verbalized understanding.  It appears doses have been adjusted in the recent months.  May consider reducing 70/30 to 30 units bid at discharge and have patient follow up with PCP.   Thanks,  Adah Perl, RN, BC-ADM Inpatient Diabetes Coordinator Pager [phone]  (8a-5p)

## 2022-01-03 NOTE — Progress Notes (Signed)
Inpatient Diabetes Program Recommendations  AACE/ADA: New Consensus Statement on Inpatient Glycemic Control (2015)  Target Ranges:  Prepandial:   less than 140 mg/dL      Peak postprandial:   less than 180 mg/dL (1-2 hours)      Critically ill patients:  140 - 180 mg/dL   Lab Results  Component Value Date   GLUCAP 202 (H) 01/03/2022   HGBA1C 8.8 (A) 11/17/2021    Review of Glycemic Control  Latest Reference Range & Units 01/02/22 22:07 01/02/22 22:49 01/03/22 03:06 01/03/22 07:52  Glucose-Capillary 70 - 99 mg/dL 65 (L) 154 (H) 150 (H) 202 (H)  Diabetes history: DM Outpatient Diabetes medications:  Novolin 70/30 36 units bid Current orders for Inpatient glycemic control:  None  Inpatient Diabetes Program Recommendations:    Note patient is NPO. D10 infusing at 75 ml/hr.  Consider adding Novolog sensitive q 4 hours.    Thanks,  Adah Perl, RN, BC-ADM Inpatient Diabetes Coordinator Pager [phone]  (8a-5p)

## 2022-01-03 NOTE — Progress Notes (Signed)
TR[facility] night cross cover note:  I was notified by RN of slight interval increase in troponin from initial value of 125 to current value of 235.  Patient currently asymptomatic, including no chest pain or shortness of breath.  Cardiology consulted.  Plan for continued trending of troponin, with next troponin scheduled to be checked at 8 AM this morning, along with checking echocardiogram this a.m., which has been ordered.    Babs Bertin, DO Hospitalist

## 2022-01-03 NOTE — ED Notes (Signed)
Admitting paged about  2nd troponin results

## 2022-01-04 ENCOUNTER — Inpatient Hospital Stay (HOSPITAL_COMMUNITY)
Admit: 2022-01-04 | Discharge: 2022-01-04 | Disposition: A | Discharge status: Home / Self Care | Payer: Medicare Other | Attending: Physician Assistant | Admitting: Physician Assistant

## 2022-01-04 ENCOUNTER — Other Ambulatory Visit: Payer: Self-pay | Admitting: Physician Assistant

## 2022-01-04 DIAGNOSIS — I441 Atrioventricular block, second degree: Secondary | ICD-10-CM | POA: Diagnosis not present

## 2022-01-04 DIAGNOSIS — R569 Unspecified convulsions: Secondary | ICD-10-CM | POA: Diagnosis not present

## 2022-01-04 DIAGNOSIS — R55 Syncope and collapse: Secondary | ICD-10-CM

## 2022-01-04 DIAGNOSIS — R9431 Abnormal electrocardiogram [ECG] [EKG]: Secondary | ICD-10-CM

## 2022-01-04 LAB — GLUCOSE, CAPILLARY
Glucose-Capillary: 107 mg/dL — ABNORMAL HIGH (ref 70–99)
Glucose-Capillary: 157 mg/dL — ABNORMAL HIGH (ref 70–99)
Glucose-Capillary: 206 mg/dL — ABNORMAL HIGH (ref 70–99)
Glucose-Capillary: 288 mg/dL — ABNORMAL HIGH (ref 70–99)
Glucose-Capillary: 339 mg/dL — ABNORMAL HIGH (ref 70–99)
Glucose-Capillary: 380 mg/dL — ABNORMAL HIGH (ref 70–99)
Glucose-Capillary: 392 mg/dL — ABNORMAL HIGH (ref 70–99)
Glucose-Capillary: 398 mg/dL — ABNORMAL HIGH (ref 70–99)
Glucose-Capillary: 435 mg/dL — ABNORMAL HIGH (ref 70–99)
Glucose-Capillary: 462 mg/dL — ABNORMAL HIGH (ref 70–99)
Glucose-Capillary: 75 mg/dL (ref 70–99)
Glucose-Capillary: 91 mg/dL (ref 70–99)

## 2022-01-04 LAB — CBC WITH DIFFERENTIAL/PLATELET
Abs Immature Granulocytes: 0.05 10*3/uL (ref 0.00–0.07)
Basophils Absolute: 0.1 10*3/uL (ref 0.0–0.1)
Basophils Relative: 1 %
Eosinophils Absolute: 0.2 10*3/uL (ref 0.0–0.5)
Eosinophils Relative: 2 %
HCT: 47.9 % (ref 39.0–52.0)
Hemoglobin: 15.7 g/dL (ref 13.0–17.0)
Immature Granulocytes: 0 %
Lymphocytes Relative: 21 %
Lymphs Abs: 2.8 10*3/uL (ref 0.7–4.0)
MCH: 27.6 pg (ref 26.0–34.0)
MCHC: 32.8 g/dL (ref 30.0–36.0)
MCV: 84.2 fL (ref 80.0–100.0)
Monocytes Absolute: 1.1 10*3/uL — ABNORMAL HIGH (ref 0.1–1.0)
Monocytes Relative: 8 %
Neutro Abs: 8.9 10*3/uL — ABNORMAL HIGH (ref 1.7–7.7)
Neutrophils Relative %: 68 %
Platelets: 267 10*3/uL (ref 150–400)
RBC: 5.69 MIL/uL (ref 4.22–5.81)
RDW: 13.8 % (ref 11.5–15.5)
WBC: 13.2 10*3/uL — ABNORMAL HIGH (ref 4.0–10.5)
nRBC: 0 % (ref 0.0–0.2)

## 2022-01-04 LAB — BASIC METABOLIC PANEL
Anion gap: 15 (ref 5–15)
BUN: 10 mg/dL (ref 8–23)
CO2: 24 mmol/L (ref 22–32)
Calcium: 9.4 mg/dL (ref 8.9–10.3)
Chloride: 96 mmol/L — ABNORMAL LOW (ref 98–111)
Creatinine, Ser: 1.31 mg/dL — ABNORMAL HIGH (ref 0.61–1.24)
GFR, Estimated: 55 mL/min — ABNORMAL LOW (ref >60)
Glucose, Bld: 450 mg/dL — ABNORMAL HIGH (ref 70–99)
Potassium: 5 mmol/L (ref 3.5–5.1)
Sodium: 135 mmol/L (ref 135–145)

## 2022-01-04 LAB — MAGNESIUM: Magnesium: 1.6 mg/dL — ABNORMAL LOW (ref 1.7–2.4)

## 2022-01-04 LAB — MRSA NEXT GEN BY PCR, NASAL: MRSA by PCR Next Gen: NOT DETECTED

## 2022-01-04 MED ORDER — INSULIN ASPART PROT & ASPART (70-30 MIX) 100 UNIT/ML ~~LOC~~ SUSP
30.0000 [IU] | Freq: Two times a day (BID) | SUBCUTANEOUS | Status: DC
  Administered 2022-01-04 – 2022-01-05 (×4): 30 [IU] via SUBCUTANEOUS
  Filled 2022-01-04: qty 10

## 2022-01-04 MED ORDER — MAGNESIUM SULFATE 4 GM/100ML IV SOLN
4.0000 g | Freq: Once | INTRAVENOUS | Status: AC
  Administered 2022-01-04: 4 g via INTRAVENOUS
  Filled 2022-01-04: qty 100

## 2022-01-04 MED ORDER — HYDRALAZINE HCL 25 MG PO TABS
25.0000 mg | ORAL_TABLET | Freq: Three times a day (TID) | ORAL | Status: DC
  Administered 2022-01-04 – 2022-01-05 (×4): 25 mg via ORAL
  Filled 2022-01-04 (×4): qty 1

## 2022-01-04 MED ORDER — METOCLOPRAMIDE HCL 5 MG/ML IJ SOLN
5.0000 mg | Freq: Once | INTRAMUSCULAR | Status: AC
  Administered 2022-01-04: 5 mg via INTRAVENOUS
  Filled 2022-01-04: qty 2

## 2022-01-04 MED ORDER — INSULIN ASPART 100 UNIT/ML IJ SOLN
7.0000 [IU] | Freq: Once | INTRAMUSCULAR | Status: AC
  Administered 2022-01-04: 7 [IU] via SUBCUTANEOUS

## 2022-01-04 NOTE — Progress Notes (Signed)
Pt's cbg 435.  Dr. Benny Lennert advised, new orders received for Novolog 70/30.  Will continue to monitor closely.

## 2022-01-04 NOTE — Progress Notes (Signed)
°  Transition of Care [facility]) Screening Note   Patient Details  Name: Andre Stout Date of Birth: 1940/10/20   Transition of Care [facility]) CM/SW Contact:    Dawayne Patricia, RN Phone Number: 01/04/2022, 2:55 PM    Transition of Care Department [facility]) has reviewed patient and no TOC needs have been identified at this time. We will continue to monitor patient advancement through interdisciplinary progression rounds. If new patient transition needs arise, please place a TOC consult.

## 2022-01-04 NOTE — Progress Notes (Addendum)
PROGRESS NOTE  Andre Stout QVZ:[id number] DOB: Dec 21, 1940 DOA: 01/02/2022 PCP: [name], FNP  Brief History   Patient is a 82 year old male with history of  prostatic cancer, diabetes type 2 who presented with seizure from home.  On presentation he was post ictal and does not remember the event.  He was witnessed by family to have grand mal seizure lasting about a minute and was unresponsive for around 20 minutes after that.  No known history of seizure disorder.  On presentation he was hypoglycemic, hypokalemic, lab work showed mild leukocytosis.  CT head did not show any acute intracranial abnormalities.  Patient was given magnesium, potassium and was started on D10 drip.  Lab work also showed elevated troponin though patient denied any chest pain,EKG showed A-V dissociation suspicious for complete heart block.  Cardiology consulted for possible pacemaker placement. Cardiology has evaluated the patient. His arrhythmia has resolved and was transient. Cardiology felt that this was due to the patient's electrolyte abnormalities. They do not recommend a pacemaker at this point. They recommend that he be discharged with a 14 day Zio patch.   This am potassium is corrected, but magnesium remains low. Will supplement.  Consultants  EP cardiology  Procedures  None  Antibiotics   Anti-infectives (From admission, onward)    None       Subjective  The patient is resting comfortably. No new complaints.  Objective   Vitals:  Vitals:   01/04/22 0743 01/04/22 1107  BP: (!) 148/72 118/61  Pulse: 71 82  Resp: 15 16  Temp: 97.6 F (36.4 C) 97.9 F (36.6 C)  SpO2: 98% 99%    Exam:  Constitutional:  The patient is awake, alert, and oriented x 3. No acute distress. Respiratory:  No increased work of breathing. No wheezes, rales, or rhonchi No tactile fremitus Cardiovascular:  Regular rate and rhythm No murmurs, ectopy, or gallups. No lateral PMI. No thrills. Abdomen:   Abdomen is soft, non-tender, non-distended No hernias, masses, or organomegaly Normoactive bowel sounds.  Musculoskeletal:  No cyanosis, clubbing, or edema Skin:  No rashes, lesions, ulcers palpation of skin: no induration or nodules Neurologic:  CN 2-12 intact Sensation all 4 extremities intact Psychiatric:  Mental status Mood, affect appropriate Orientation to person, place, time  judgment and insight appear intact  I have personally reviewed the following:   Today's Data  vitals  Lab Data  CBC, BMP. Magnesium  Micro Data  MRSA negative  Imaging    Cardiology Data  Echocardiology EKG  Other Data    Scheduled Meds:  aspirin  81 mg Oral Daily   atorvastatin  40 mg Oral Daily   enoxaparin (LOVENOX) injection  40 mg Subcutaneous Q24H   insulin aspart  0-6 Units Subcutaneous TID WC   insulin aspart protamine- aspart  30 Units Subcutaneous BID WC   sodium chloride flush  3 mL Intravenous Q12H   Continuous Infusions:  magnesium sulfate bolus IVPB 4 g (01/04/22 1451)    Principal Problem:   Seizure ([place]) Active Problems:   Hypomagnesemia   Diabetes (H[facility])   Abnormal EKG   Hypokalemia   LOS: 1 day   A & P  New onset seizure: No history of seizures in the past.  Brought because he had an event of grand mal seizure and postictal episode. Blood sugar was low on presentation, so hypoglycemia induced seizure suspected.  Currently he is completely alert and oriented.  Seizure precaution. CT head did not show any acute intracranial  abnormalities. Case was discussed with neurology in the emergency department.  No further work-up or AED recommended for single episode of seizure induced by hypoglycemia   Hypokalemia/hypomagnesemia: Being supplemented and monitored. Magnesium remains low this am. Supplementing and will recheck in the am.   Abnormal EKG/R/O 3rd degreee AV block/elevated troponin: Cardio consulted and following.  EKG was suspicious for A-V  dissociation.  Likely secondary to electrolyte abnormalities.  Monitor on telemetry.  Currently asymptomatic.  Heart rate in the range of 70s.  Echo showed EF of 55 to 86%, grade 1 diastolic dysfunction.  Denies any chest pain.  Cardiology consulted for possible pacemaker placement. Cardiology has evaluated the patient. His arrhythmia has resolved and was transient. Cardiology felt that this was due to the patient's electrolyte abnormalities. They do not recommend a pacemaker at this point. They recommend that he be discharged with a 14 day Zio patch.   Diabetes type 2: Takes insulin at home.  Hypoglycemic on arrival.  Started on  D10 .A1c of 8.2.We  will consult diabetic coordinator. D5 was discontinued as patient became hyperglycemic. He was restarted on long acting insulin at a lower dose than his home dosage.   Hypertension: Patient is normotensive on prn hydralazine. Will make hydralazine scheduled.  Does not take any medication at home.  Continue as needed medications for now.     Leukocytosis: Mild.  Most likely reactive.  Continue to monitor.   Deconditioning: We will request PT/OT evaluation after definitive plan for pacemaker.  Lives with family.  Ambulates with the help of cane.  Obesity: BMI 31.84. Noted. Sensible weight loss through increased activity and dietary modification is encouraged. Complicates all cares.   I have seen and examined this patient myself. I have spent 32 minutes in his evaluation and care.   DVT prophylaxis:Lovenox Code Status: Full Family Communication: None available.   Patient status:Inpatient   Dispo: The patient is from: Home              Anticipated d/c is to: Home              Anticipated d/c date is: In next 1 to 2 days, pending cardiology decision pacemaker placement   Travontae Freiberger, DO [facility] Direct contact: see [email or web address]  7PM-7AM contact night coverage as above 01/04/2022, 3:25 PM  LOS: 1 day

## 2022-01-04 NOTE — Progress Notes (Signed)
Cardiology Progress Note  Patient ID: Andre Stout MRN: [id number] DOB: [birth year] Date of Encounter: 01/04/2022  Primary Cardiologist: None  Subjective   Chief Complaint: None.   HPI: Denies CP or SOB. Tele with 1AVB.   ROS:  All other ROS reviewed and negative. Pertinent positives noted in the HPI.     Inpatient Medications  Scheduled Meds:  aspirin  81 mg Oral Daily   atorvastatin  40 mg Oral Daily   enoxaparin (LOVENOX) injection  40 mg Subcutaneous Q24H   insulin aspart  0-6 Units Subcutaneous TID WC   insulin aspart protamine- aspart  30 Units Subcutaneous BID WC   sodium chloride flush  3 mL Intravenous Q12H   Continuous Infusions:  PRN Meds: acetaminophen **OR** acetaminophen, hydrALAZINE, polyethylene glycol   Vital Signs   Vitals:   01/04/22 0045 01/04/22 0348 01/04/22 0743 01/04/22 1107  BP: (!) 163/80 133/87 (!) 148/72 118/61  Pulse: 89 74 71 82  Resp: 18 19 15 16   Temp:  98.1 F (36.7 C) 97.6 F (36.4 C) 97.9 F (36.6 C)  TempSrc:  Oral Oral Oral  SpO2: 97% 98% 98% 99%  Weight:      Height:        Intake/Output Summary (Last 24 hours) at 01/04/2022 1123 Last data filed at 01/04/2022 1107 Gross per 24 hour  Intake 400 ml  Output 950 ml  Net -550 ml   Last 3 Weights 01/03/2022 01/02/2022 12/06/2021  Weight (lbs) 191 lb 5.8 oz 200 lb 13.4 oz 200 lb 12.8 oz  Weight (kg) 86.8 kg 91.1 kg 91.082 kg      Telemetry  Overnight telemetry shows SR 70s with 1AVB, which I personally reviewed.    Physical Exam   Vitals:   01/04/22 0045 01/04/22 0348 01/04/22 0743 01/04/22 1107  BP: (!) 163/80 133/87 (!) 148/72 118/61  Pulse: 89 74 71 82  Resp: 18 19 15 16   Temp:  98.1 F (36.7 C) 97.6 F (36.4 C) 97.9 F (36.6 C)  TempSrc:  Oral Oral Oral  SpO2: 97% 98% 98% 99%  Weight:      Height:        Intake/Output Summary (Last 24 hours) at 01/04/2022 1123 Last data filed at 01/04/2022 1107 Gross per 24 hour  Intake 400 ml  Output 950 ml  Net -550 ml     Last 3 Weights 01/03/2022 01/02/2022 12/06/2021  Weight (lbs) 191 lb 5.8 oz 200 lb 13.4 oz 200 lb 12.8 oz  Weight (kg) 86.8 kg 91.1 kg 91.082 kg    Body mass index is 31.84 kg/m.  General: Well nourished, well developed, in no acute distress Head: Atraumatic, normal size  Eyes: PEERLA, EOMI  Neck: Supple, no JVD Endocrine: No thryomegaly Cardiac: Normal S1, S2; RRR; no murmurs, rubs, or gallops Lungs: Clear to auscultation bilaterally, no wheezing, rhonchi or rales  Abd: Soft, nontender, no hepatomegaly  Ext: No edema, pulses 2+ Musculoskeletal: No deformities, BUE and BLE strength normal and equal Skin: Warm and dry, no rashes   Neuro: Alert and oriented to person, place, time, and situation, CNII-XII grossly intact, no focal deficits  Psych: Normal mood and affect   Labs  High Sensitivity Troponin:   Recent Labs  Lab 01/03/22 0025 01/03/22 0324 01/03/22 1225  TROPONINIHS 125* 235* 1,816*     Cardiac EnzymesNo results for input(s): TROPONINI in the last 168 hours. No results for input(s): TROPIPOC in the last 168 hours.  Chemistry Recent Labs  Lab 01/02/22 2008  01/03/22 0025 01/03/22 1225 01/04/22 0407  NA 139 135 136 135  K 2.2* 3.1* 3.5 5.0  CL 108 100 102 96*  CO2 21* 24 28 24   GLUCOSE 69* 152* 279* 450*  BUN 6* 5* <5* 10  CREATININE 0.83 1.02 0.97 1.31*  CALCIUM 7.4* 8.8* 8.8* 9.4  PROT 5.2*  --   --   --   ALBUMIN 2.4*  --   --   --   AST 14*  --   --   --   ALT 5  --   --   --   ALKPHOS 50  --   --   --   BILITOT 1.0  --   --   --   GFRNONAA >60 >60 >60 55*  ANIONGAP 10 11 6 15     Hematology Recent Labs  Lab 01/02/22 2008 01/03/22 0025 01/04/22 0407  WBC 11.2* 13.4* 13.2*  RBC 5.14 5.43 5.69  HGB 14.2 15.0 15.7  HCT 44.1 46.4 47.9  MCV 85.8 85.5 84.2  MCH 27.6 27.6 27.6  MCHC 32.2 32.3 32.8  RDW 13.7 13.9 13.8  PLT 226 261 267   BNPNo results for input(s): BNP, PROBNP in the last 168 hours.  DDimer No results for input(s): DDIMER in the last  168 hours.   Radiology  CT Head Wo Contrast  Result Date: 01/02/2022 CLINICAL DATA:  Seizure disorder, clinical change EXAM: CT HEAD WITHOUT CONTRAST TECHNIQUE: Contiguous axial images were obtained from the base of the skull through the vertex without intravenous contrast. COMPARISON:  CT head 10/09/2020 BRAIN: BRAIN Cerebral ventricle sizes are concordant with the degree of cerebral volume loss. Patchy and confluent areas of decreased attenuation are noted throughout the deep and periventricular white matter of the cerebral hemispheres bilaterally, compatible with chronic microvascular ischemic disease. No evidence of large-territorial acute infarction. No parenchymal hemorrhage. No mass lesion. No extra-axial collection. No mass effect or midline shift. No hydrocephalus. Basilar cisterns are patent. Vascular: No hyperdense vessel. Skull: Limited evaluation for skull fracture due to motion artifact. Sinuses/Orbits: Paranasal sinuses and mastoid air cells are clear. The orbits are unremarkable. Other: None. IMPRESSION: No acute intracranial abnormality. Electronically Signed   By: Iven Finn M.D.   On: 01/02/2022 21:04   ECHOCARDIOGRAM COMPLETE  Result Date: 01/03/2022    ECHOCARDIOGRAM REPORT   Patient Name:   Andre Stout Date of Exam: 01/03/2022 Medical Rec #:  [id number]      Height:       65.0 in Accession #:    [id number]     Weight:       200.8 lb Date of Birth:  15-Jul-1940     BSA:          1.982 m Patient Age:    82 years       BP:           162/79 mmHg Patient Gender: M              HR:           66 bpm. Exam Location:  Inpatient Procedure: 2D Echo Indications:     Complete Heart Block  History:         Patient has prior history of Echocardiogram examinations, most                  recent 10/10/2020. Risk Factors:Diabetes. Prostate Cancer.  Sonographer:     SP Referring Phys:  [id number] [place] Diagnosing Phys: Gwyndolyn Kaufman  MD IMPRESSIONS  1. Left ventricular ejection fraction,  by estimation, is 55 to 60%. The left ventricle has normal function. Left ventricular endocardial border not optimally defined to evaluate regional wall motion. Appears grossly normal. There is mild-to-moderate hypertrophy of the basal septum. The rest of the LV segments demonstrate mild left ventricular hypertrophy. Left ventricular diastolic parameters are consistent with Grade I diastolic dysfunction (impaired relaxation).  2. Right ventricular systolic function is normal. The right ventricular size is normal.  3. The mitral valve is normal in structure. No evidence of mitral valve regurgitation. No evidence of mitral stenosis.  4. The aortic valve is tricuspid. There is mild calcification of the aortic valve. There is mild thickening of the aortic valve. Aortic valve regurgitation is not visualized. Aortic valve sclerosis/calcification is present, without any evidence of aortic stenosis.  5. The inferior vena cava is normal in size with greater than 50% respiratory variability, suggesting right atrial pressure of 3 mmHg. Comparison(s): Compared to prior echo report in 09/2020, there is no significant change. FINDINGS  Left Ventricle: Left ventricular ejection fraction, by estimation, is 55 to 60%. The left ventricle has normal function. Left ventricular endocardial border not optimally defined to evaluate regional wall motion. The left ventricular internal cavity size was normal in size. There is moderate hypertrophy of the basal septum. The rest of the LV segments demonstrate mild left ventricular hypertrophy. Left ventricular diastolic parameters are consistent with Grade I diastolic dysfunction (impaired relaxation). Right Ventricle: The right ventricular size is normal. No increase in right ventricular wall thickness. Right ventricular systolic function is normal. Left Atrium: Left atrial size was normal in size. Right Atrium: Right atrial size was normal in size. Pericardium: There is no evidence of  pericardial effusion. Mitral Valve: The mitral valve is normal in structure. Mild mitral annular calcification. No evidence of mitral valve regurgitation. No evidence of mitral valve stenosis. Tricuspid Valve: The tricuspid valve is normal in structure. Tricuspid valve regurgitation is not demonstrated. Aortic Valve: The aortic valve is tricuspid. There is mild calcification of the aortic valve. There is mild thickening of the aortic valve. Aortic valve regurgitation is not visualized. Aortic valve sclerosis/calcification is present, without any evidence of aortic stenosis. Aortic valve mean gradient measures 1.0 mmHg. Aortic valve peak gradient measures 2.4 mmHg. Aortic valve area, by VTI measures 2.54 cm. Pulmonic Valve: The pulmonic valve was normal in structure. Pulmonic valve regurgitation is trivial. Aorta: The aortic root and ascending aorta are structurally normal, with no evidence of dilitation. Venous: The inferior vena cava is normal in size with greater than 50% respiratory variability, suggesting right atrial pressure of 3 mmHg. IAS/Shunts: The atrial septum is grossly normal.  LEFT VENTRICLE PLAX 2D LVIDd:         3.60 cm   Diastology LVIDs:         2.70 cm   LV e' medial:    4.46 cm/s LV PW:         1.10 cm   LV E/e' medial:  12.6 LV IVS:        1.30 cm   LV e' lateral:   5.87 cm/s LVOT diam:     1.90 cm   LV E/e' lateral: 9.6 LV SV:         45 LV SV Index:   22 LVOT Area:     2.84 cm  RIGHT VENTRICLE             IVC RV Basal diam:  3.60 cm  IVC diam: 1.80 cm RV S prime:     10.00 cm/s TAPSE (M-mode): 1.7 cm LEFT ATRIUM             Index        RIGHT ATRIUM           Index LA diam:        4.00 cm 2.02 cm/m   RA Area:     14.40 cm LA Vol (A2C):   63.6 ml 32.09 ml/m  RA Volume:   35.70 ml  18.02 ml/m LA Vol (A4C):   44.6 ml 22.51 ml/m LA Biplane Vol: 53.3 ml 26.90 ml/m  AORTIC VALVE AV Area (Vmax):    2.67 cm AV Area (Vmean):   2.41 cm AV Area (VTI):     2.54 cm AV Vmax:           77.50  cm/s AV Vmean:          55.400 cm/s AV VTI:            0.175 m AV Peak Grad:      2.4 mmHg AV Mean Grad:      1.0 mmHg LVOT Vmax:         73.10 cm/s LVOT Vmean:        47.000 cm/s LVOT VTI:          0.157 m LVOT/AV VTI ratio: 0.90  AORTA Ao Root diam: 3.30 cm Ao Asc diam:  3.00 cm MITRAL VALVE MV Area (PHT): 3.03 cm    SHUNTS MV Decel Time: 250 msec    Systemic VTI:  0.16 m MV E velocity: 56.30 cm/s  Systemic Diam: 1.90 cm MV A velocity: 97.40 cm/s MV E/A ratio:  0.58 Gwyndolyn Kaufman MD Electronically signed by Gwyndolyn Kaufman MD Signature Date/Time: 01/03/2022/11:10:26 AM    Final (Updated)     Cardiac Studies  TTE 01/03/2022  1. Left ventricular ejection fraction, by estimation, is 55 to 60%. The  left ventricle has normal function. Left ventricular endocardial border  not optimally defined to evaluate regional wall motion. Appears grossly  normal. There is mild-to-moderate  hypertrophy of the basal septum. The rest of the LV segments demonstrate  mild left ventricular hypertrophy. Left ventricular diastolic parameters  are consistent with Grade I diastolic dysfunction (impaired relaxation).   2. Right ventricular systolic function is normal. The right ventricular  size is normal.   3. The mitral valve is normal in structure. No evidence of mitral valve  regurgitation. No evidence of mitral stenosis.   4. The aortic valve is tricuspid. There is mild calcification of the  aortic valve. There is mild thickening of the aortic valve. Aortic valve  regurgitation is not visualized. Aortic valve sclerosis/calcification is  present, without any evidence of  aortic stenosis.   5. The inferior vena cava is normal in size with greater than 50%  respiratory variability, suggesting right atrial pressure of 3 mmHg.   Patient Profile  Andre Stout is a 82 y.o. male with diabetes, hypertension, prostate cancer who was admitted on 01/03/2022 after syncopal event with possible seizure.  EKG showed complete  heart block on arrival to the emergency room.  Assessment & Plan   #Syncope #Transient CHB #First-degree block -Admitted with syncopal event and seizure-like activity.  CT head negative.  Was severely hypokalemic on arrival.  His initial EKG demonstrated complete heart block with escape rhythm at 71 bpm.  EKG at baseline demonstrates first-degree block over ~350 ms.  -Echo shows  normal LV function. -He has had no further high-grade conduction disease. -Evaluated by EP.  They recommended outpatient monitor.  No plans for pacemaker this admission. -Suspect his transient complete heart block was all related to metabolic derangements in the setting of seizure as well as hypoglycemia.  He also was hypokalemic. 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  Would recommend to avoid any AV nodal agents moving forward.  I will set him up for a 14-day Zio patch as an outpatient. -He will see me after this.  #Elevated troponin #Demand ischemia secondary to seizure -Denies chest pain or trouble breathing.  Had none of this prior to the episode.  Echo shows normal LVEF.  No wall motion abnormalities on my review. -Would recommend to continue aspirin 81 mg daily. -I have added Lipitor 40 mg daily. -I will follow him as an outpatient and likely pursue an outpatient stress test. -lack of symptoms and echo are reassuring.   [facility] will sign off.   Medication Recommendations: As above.  I will set up a 14-day Zio patch.  Aspirin statin as above.  Avoid AV nodal agents. Other recommendations (labs, testing, etc): None. Follow up as an outpatient: I will arrange outpatient follow-up with me in 6 to 8 weeks after Zio patch.  For questions or updates, please contact [place] Please consult [email or web address] for contact info under   Signed, [place] T. Audie Box, MD, [place]  01/04/2022 11:23 AM

## 2022-01-05 ENCOUNTER — Inpatient Hospital Stay (HOSPITAL_COMMUNITY)
Admit: 2022-01-05 | Discharge: 2022-01-05 | Disposition: A | Discharge status: Home / Self Care | Payer: Medicare Other | Attending: Physician Assistant | Admitting: Physician Assistant

## 2022-01-05 ENCOUNTER — Ambulatory Visit (INDEPENDENT_AMBULATORY_CARE_PROVIDER_SITE_OTHER): Payer: Medicare Other

## 2022-01-05 LAB — BASIC METABOLIC PANEL
Anion gap: 8 (ref 5–15)
Anion gap: 8 (ref 5–15)
BUN: 16 mg/dL (ref 8–23)
BUN: 16 mg/dL (ref 8–23)
CO2: 26 mmol/L (ref 22–32)
CO2: 28 mmol/L (ref 22–32)
Calcium: 8.9 mg/dL (ref 8.9–10.3)
Calcium: 9.2 mg/dL (ref 8.9–10.3)
Chloride: 100 mmol/L (ref 98–111)
Chloride: 98 mmol/L (ref 98–111)
Creatinine, Ser: 1.44 mg/dL — ABNORMAL HIGH (ref 0.61–1.24)
Creatinine, Ser: 1.4 mg/dL — ABNORMAL HIGH (ref 0.61–1.24)
GFR, Estimated: 49 mL/min — ABNORMAL LOW (ref >60)
GFR, Estimated: 50 mL/min — ABNORMAL LOW (ref >60)
Glucose, Bld: 90 mg/dL (ref 70–99)
Glucose, Bld: 149 mg/dL — ABNORMAL HIGH (ref 70–99)
Potassium: 3.2 mmol/L — ABNORMAL LOW (ref 3.5–5.1)
Potassium: 4.3 mmol/L (ref 3.5–5.1)
Sodium: 134 mmol/L — ABNORMAL LOW (ref 135–145)
Sodium: 134 mmol/L — ABNORMAL LOW (ref 135–145)

## 2022-01-05 LAB — CBC WITH DIFFERENTIAL/PLATELET
Abs Immature Granulocytes: 0.04 10*3/uL (ref 0.00–0.07)
Basophils Absolute: 0.1 10*3/uL (ref 0.0–0.1)
Basophils Relative: 1 %
Eosinophils Absolute: 0.3 10*3/uL (ref 0.0–0.5)
Eosinophils Relative: 3 %
HCT: 41.5 % (ref 39.0–52.0)
Hemoglobin: 13.8 g/dL (ref 13.0–17.0)
Immature Granulocytes: 0 %
Lymphocytes Relative: 27 %
Lymphs Abs: 2.6 10*3/uL (ref 0.7–4.0)
MCH: 27.5 pg (ref 26.0–34.0)
MCHC: 33.3 g/dL (ref 30.0–36.0)
MCV: 82.7 fL (ref 80.0–100.0)
Monocytes Absolute: 1 10*3/uL (ref 0.1–1.0)
Monocytes Relative: 10 %
Neutro Abs: 5.9 10*3/uL (ref 1.7–7.7)
Neutrophils Relative %: 59 %
Platelets: 225 10*3/uL (ref 150–400)
RBC: 5.02 MIL/uL (ref 4.22–5.81)
RDW: 14.2 % (ref 11.5–15.5)
WBC: 9.8 10*3/uL (ref 4.0–10.5)
nRBC: 0 % (ref 0.0–0.2)

## 2022-01-05 LAB — GLUCOSE, CAPILLARY
Glucose-Capillary: 81 mg/dL (ref 70–99)
Glucose-Capillary: 103 mg/dL — ABNORMAL HIGH (ref 70–99)
Glucose-Capillary: 125 mg/dL — ABNORMAL HIGH (ref 70–99)
Glucose-Capillary: 152 mg/dL — ABNORMAL HIGH (ref 70–99)
Glucose-Capillary: 173 mg/dL — ABNORMAL HIGH (ref 70–99)
Glucose-Capillary: 158 mg/dL — ABNORMAL HIGH (ref 70–99)
Glucose-Capillary: 209 mg/dL — ABNORMAL HIGH (ref 70–99)
Glucose-Capillary: 164 mg/dL — ABNORMAL HIGH (ref 70–99)

## 2022-01-05 LAB — MAGNESIUM: Magnesium: 2.2 mg/dL (ref 1.7–2.4)

## 2022-01-05 MED ORDER — POTASSIUM CHLORIDE CRYS ER 20 MEQ PO TBCR
40.0000 meq | EXTENDED_RELEASE_TABLET | ORAL | Status: AC
  Administered 2022-01-05 (×2): 40 meq via ORAL
  Filled 2022-01-05 (×2): qty 2

## 2022-01-05 MED ORDER — ASPIRIN 81 MG PO CHEW: 81.0000 mg | CHEWABLE_TABLET | Freq: Every day | ORAL | 0 refills | Status: DC

## 2022-01-05 MED ORDER — ATORVASTATIN CALCIUM 40 MG PO TABS: 40.0000 mg | ORAL_TABLET | Freq: Every day | ORAL | 0 refills | Status: DC

## 2022-01-05 MED ORDER — METOPROLOL TARTRATE 25 MG PO TABS: 25.0000 mg | ORAL_TABLET | Freq: Two times a day (BID) | ORAL | 11 refills | Status: DC

## 2022-01-05 NOTE — Evaluation (Signed)
Physical Therapy Evaluation Patient Details Name: Andre Stout MRN: [id number] DOB: 1940-07-31 Today's Date: 01/05/2022  History of Present Illness  82 y/o male presented to ED on 01/02/22 following syncopal event with grand mal seizure. EKG showed complete heart block on arrival to ED. Patient with elevated troponin will be treated as outpatient. CT head negative. PMH: syncope, prostate cancer, diabetes, HTN  Clinical Impression  Patient admitted with above diagnosis. Patient presents with generalized weakness, impaired balance, decreased activity tolerance, and impaired cognition. Patient with poor safety awareness and short term memory deficits requiring frequent cues. Patient ambulated with RW and minA-min guard. Patient disoriented to time and situation. Patient will benefit from skilled PT services during acute stay to address listed deficits. Recommend HHPT and constant supervision at discharge for safety.      Recommendations for follow up therapy are one component of a multi-disciplinary discharge planning process, led by the attending physician.  Recommendations may be updated based on patient status, additional functional criteria and insurance authorization.  Follow Up Recommendations Home health PT    Assistance Recommended at Discharge Frequent or constant Supervision/Assistance  Patient can return home with the following  A little help with walking and/or transfers;Assistance with cooking/housework;Direct supervision/assist for medications management;Direct supervision/assist for financial management;Assist for transportation;A little help with bathing/dressing/bathroom    Equipment Recommendations Rolling Rilei Kravitz (2 wheels)  Recommendations for Other Services       Functional Status Assessment Patient has had a recent decline in their functional status and demonstrates the ability to make significant improvements in function in a reasonable and predictable amount of time.      Precautions / Restrictions Precautions Precautions: Fall Precaution Comments: seizure Restrictions Weight Bearing Restrictions: No      Mobility  Bed Mobility Overal bed mobility: Needs Assistance Bed Mobility: Supine to Sit     Supine to sit: Supervision;HOB elevated     General bed mobility comments: supervision for safety    Transfers Overall transfer level: Needs assistance Equipment used: Rolling Nishat Livingston (2 wheels) Transfers: Sit to/from Stand Sit to Stand: Min assist           General transfer comment: minA to steady upon standing due to initial posterior lean. Cues for hand placement for pushing up on bed with poor follow through    Ambulation/Gait Ambulation/Gait assistance: Min assist;Min guard Gait Distance (Feet): 150 Feet Assistive device: Rolling Emery Binz (2 wheels) Gait Pattern/deviations: Step-through pattern;Decreased stride length;Trunk flexed Gait velocity: decreased     General Gait Details: cues for RW management and close proxmity with poor follow through. MinA at times for balance due to increased distance between patient and RW. Otherwise min guard  Stairs            Wheelchair Mobility    Modified Rankin (Stroke Patients Only)       Balance Overall balance assessment: Mild deficits observed, not formally tested                                           Pertinent Vitals/Pain Pain Assessment: Faces Faces Pain Scale: No hurt Pain Intervention(s): Monitored during session    Home Living Family/patient expects to be discharged to:: Private residence Living Arrangements: Children (son and daughter in Sports coach) Available Help at Discharge: Family Type of Home: House Home Access: Stairs to enter   Technical brewer of Steps: 1   Home Layout: One  level Home Equipment: Cane - single point      Prior Function Prior Level of Function : Needs assist  Cognitive Assist : ADLs (cognitive)     Physical Assist  : Mobility (physical) Mobility (physical): Gait   Mobility Comments: uses cane for mobility and furniture surfs in the house. Family assists with community mobility ADLs Comments: daughter in law assists with financial and medication management     Hand Dominance        Extremity/Trunk Assessment   Upper Extremity Assessment Upper Extremity Assessment: Defer to OT evaluation    Lower Extremity Assessment Lower Extremity Assessment: Generalized weakness    Cervical / Trunk Assessment Cervical / Trunk Assessment: Kyphotic  Communication   Communication: No difficulties  Cognition Arousal/Alertness: Awake/alert Behavior During Therapy: WFL for tasks assessed/performed Overall Cognitive Status: No family/caregiver present to determine baseline cognitive functioning Area of Impairment: Attention;Orientation;Memory;Following commands;Safety/judgement;Awareness;Problem solving                 Orientation Level: Disoriented to;Situation;Time Current Attention Level: Sustained Memory: Decreased short-term memory Following Commands: Follows one step commands inconsistently;Follows one step commands with increased time Safety/Judgement: Decreased awareness of safety;Decreased awareness of deficits Awareness: Intellectual Problem Solving: Slow processing;Difficulty sequencing;Requires verbal cues;Requires tactile cues General Comments: stating "2024" for date with no attempts to correct. Decreased short term memory deficits with inability to recall room number within 30-60 seconds of providing number. Patient with poor safety awareness        General Comments      Exercises     Assessment/Plan    PT Assessment Patient needs continued PT services  PT Problem List Decreased strength;Decreased activity tolerance;Decreased balance;Decreased mobility;Decreased cognition;Decreased safety awareness       PT Treatment Interventions DME instruction;Gait training;Stair  training;Functional mobility training;Therapeutic activities;Therapeutic exercise;Balance training;Patient/family education    PT Goals (Current goals can be found in the Care Plan section)  Acute Rehab PT Goals Patient Stated Goal: to go home PT Goal Formulation: With patient Time For Goal Achievement: 01/19/22 Potential to Achieve Goals: Good    Frequency Min 3X/week     Co-evaluation               AM-PAC PT "6 Clicks" Mobility  Outcome Measure Help needed turning from your back to your side while in a flat bed without using bedrails?: A Little Help needed moving from lying on your back to sitting on the side of a flat bed without using bedrails?: A Little Help needed moving to and from a bed to a chair (including a wheelchair)?: A Little Help needed standing up from a chair using your arms (e.g., wheelchair or bedside chair)?: A Little Help needed to walk in hospital room?: A Little Help needed climbing 3-5 steps with a railing? : A Little 6 Click Score: 18    End of Session Equipment Utilized During Treatment: Gait belt Activity Tolerance: Patient tolerated treatment well Patient left: in chair;with call bell/phone within reach;with chair alarm set Nurse Communication: Mobility status PT Visit Diagnosis: Unsteadiness on feet (R26.81);Muscle weakness (generalized) (M62.81)    Time: 1140-1201 PT Time Calculation (min) (ACUTE ONLY): 21 min   Charges:   PT Evaluation $PT Eval Moderate Complexity: 1 Mod          Quin Mathenia A. Gilford Rile PT, DPT Acute Rehabilitation Services Pager [phone] Office [phone]   Linna Hoff 01/05/2022, 12:14 PM

## 2022-01-05 NOTE — TOC Initial Note (Addendum)
Transition of Care [facility]) - Initial/Assessment Note    Patient Details  Name: Andre Stout MRN: [id number] Date of Birth: Nov 18, 1940  [facility] CM/SW Contact:    Erenest Rasher, RN Phone Number: [phone] 01/05/2022, 5:03 PM  Clinical Narrative:                  HF TOC CM spoke to pt and gave permission to speak to dtr, Billie. Contacted [name] and states his grandson lives in the home. Pt has a cane in the home. Offered choice for [facility]. States pt had HH with [facility]. Contacted Malachy Mood with new referral. Accepted referral. [place] for [employer] for home.   Received notification from [place] that pt received RW in 2021.       Expected Discharge Plan: [place] Barriers to Discharge: No Barriers Identified   Patient Goals and CMS Choice Patient states their goals for this hospitalization and ongoing recovery are:: return home CMS Medicare.gov Compare Post Acute Care list provided to:: Patient Represenative (must comment) (dtr-Billie Denice [place]) Choice offered to / list presented to : Adult Children  Expected Discharge Plan and Services Expected Discharge Plan: [place] In-house Referral: Clinical Social Work Discharge Planning Services: CM Consult Post Acute Care Choice: [place] arrangements for the past 2 months: [place] Expected Discharge Date: 01/05/22                 DME Agency: [employer] Date DME Agency Contacted: 01/05/22 Time DME Agency Contacted: 1702 Representative spoke with at DME Agency: Melina Modena HH Arranged: PT, OT [facility] Agency: [place] Date [place]: 01/05/22 Time [place]: [place] Representative spoke with at [place]: Sharmon Revere  Prior Living Arrangements/Services Living arrangements for the past 2 months: [place] with:: Adult Children Patient language and need for interpreter reviewed:: Yes Do you  feel safe going back to the place where you live?: Yes      Need for Family Participation in Patient Care: Yes (Comment) Care giver support system in place?: Yes (comment) Current home services: DME (cane)    Activities of Daily Living      Permission Sought/Granted Permission sought to share information with : Case Manager, Family Supports, PCP Permission granted to share information with : Yes, Verbal Permission Granted  Share Information with NAME: Duard Brady  Permission granted to share info w AGENCY: [place], DME  Permission granted to share info w Relationship: daughter  Permission granted to share info w Contact Information: [phone]  Emotional Assessment   Attitude/Demeanor/Rapport: Engaged Affect (typically observed): Accepting Orientation: : Oriented to Place, Oriented to  Time, Oriented to Situation, Oriented to Self   Psych Involvement: No (comment)  Admission diagnosis:  Seizure [facility]) [R56.9] Patient Active Problem List   Diagnosis Date Noted   Seizure ([place]) 01/02/2022   Abnormal EKG 01/02/2022   Hypokalemia 01/02/2022   Diabetes ([place]) 09/15/2021   Syncope and collapse 10/09/2020   Prostate cancer ([place]) 03/25/2019   Hypophosphatemia 03/06/2016   Hypomagnesemia 03/06/2016   Influenza A 03/05/2016   Benign essential HTN 03/05/2016   AKI (acute kidney injury) ([place]) 03/04/2016   Routine general medical examination at a health care facility 05/02/2015   Medicare annual wellness visit, subsequent 05/02/2015   PCP:  [name], [place] Pharmacy:   [id number] - [place], [place]  [address] [place] [address] Phone: [phone] Fax: [phone]  [facility] [place], [place] [address] Phone: [phone] Fax: [phone]     Social Determinants of Health (SDOH) Interventions    Readmission  Risk Interventions No flowsheet data found.

## 2022-01-05 NOTE — Evaluation (Signed)
Occupational Therapy Evaluation Patient Details Name: Andre Stout MRN: [id number] DOB: [birth year] Today's Date: 01/05/2022   History of Present Illness 82 y/o male presented to ED on 01/02/22 following syncopal event with grand mal seizure. EKG showed complete heart block on arrival to ED. Patient with elevated troponin will be treated as outpatient. CT head negative. PMH: syncope, prostate cancer, diabetes, HTN   Clinical Impression   Patient admitted with the above diagnosis.  PTA his son and DIL live with him.  They work during the day, but assist with medications, bill payment and community mobility.  Currently he is at a Min guard level for safety concerns and mild confusion.  Acute OT can follow, and initial 24 hour from family is recommended.  No HH OT is needed.  Unclear if at baseline he has mild cognitive dysfunction, or this is as a result of the seizure.        Recommendations for follow up therapy are one component of a multi-disciplinary discharge planning process, led by the attending physician.  Recommendations may be updated based on patient status, additional functional criteria and insurance authorization.   Follow Up Recommendations  No OT follow up    Assistance Recommended at Discharge Intermittent Supervision/Assistance  Patient can return home with the following      Functional Status Assessment  Patient has had a recent decline in their functional status and demonstrates the ability to make significant improvements in function in a reasonable and predictable amount of time.  Equipment Recommendations  None recommended by OT    Recommendations for Other Services       Precautions / Restrictions Precautions Precautions: Fall Precaution Comments: seizure Restrictions Weight Bearing Restrictions: No      Mobility Bed Mobility Overal bed mobility: Needs Assistance Bed Mobility: Supine to Sit     Supine to sit: Supervision;HOB elevated     General bed  mobility comments: supervision for safety    Transfers Overall transfer level: Needs assistance Equipment used: Rolling walker (2 wheels) Transfers: Sit to/from Stand Sit to Stand: Min assist                Balance Overall balance assessment: Mild deficits observed, not formally tested                                         ADL either performed or assessed with clinical judgement   ADL       Grooming: Wash/dry hands;Wash/dry face;Supervision/safety;Standing           Upper Body Dressing : Supervision/safety;Sitting   Lower Body Dressing: Min guard;Sit to/from stand;Cueing for safety   Toilet Transfer: Min guard;Rolling walker (2 wheels);Cueing for safety                   Vision Patient Visual Report: No change from baseline       Perception Perception Perception: Within Functional Limits   Praxis Praxis Praxis: Intact    Pertinent Vitals/Pain Pain Assessment: No/denies pain Faces Pain Scale: No hurt Pain Intervention(s): Monitored during session     Hand Dominance Right   Extremity/Trunk Assessment Upper Extremity Assessment Upper Extremity Assessment: Overall WFL for tasks assessed;RUE deficits/detail RUE Deficits / Details: chronic R shoulder dysfunction RUE Sensation: WNL RUE Coordination: WNL   Lower Extremity Assessment Lower Extremity Assessment: Defer to PT evaluation   Cervical / Trunk Assessment Cervical / Trunk  Assessment: Kyphotic   Communication Communication Communication: No difficulties   Cognition Arousal/Alertness: Awake/alert Behavior During Therapy: WFL for tasks assessed/performed Overall Cognitive Status: No family/caregiver present to determine baseline cognitive functioning Area of Impairment: Attention;Orientation;Memory;Following commands;Safety/judgement;Awareness;Problem solving                 Orientation Level: Disoriented to;Situation;Time Current Attention Level:  Sustained Memory: Decreased short-term memory Following Commands: Follows one step commands inconsistently;Follows one step commands with increased time Safety/Judgement: Decreased awareness of safety;Decreased awareness of deficits Awareness: Intellectual Problem Solving: Slow processing;Difficulty sequencing;Requires verbal cues;Requires tactile cues General Comments: stating "2024" for date with no attempts to correct. Decreased short term memory deficits with inability to recall room number within 30-60 seconds of providing number. Patient with poor safety awareness     General Comments       Exercises     Shoulder Instructions      Home Living Family/patient expects to be discharged to:: Private residence Living Arrangements: Children Available Help at Discharge: Family Type of Home: House Home Access: Stairs to enter Technical brewer of Steps: 1   Home Layout: One level     Bathroom Shower/Tub: Tub/shower unit         Home Equipment: Cane - single point          Prior Functioning/Environment Prior Level of Function : Needs assist  Cognitive Assist : ADLs (cognitive)     Physical Assist : Mobility (physical) Mobility (physical): Gait   Mobility Comments: uses cane for mobility and furniture surfs in the house. Family assists with community mobility ADLs Comments: daughter in law assists with financial and medication management        OT Problem List: Decreased safety awareness;Decreased cognition      OT Treatment/Interventions: Self-care/ADL training;Therapeutic activities;Therapeutic exercise;Balance training    OT Goals(Current goals can be found in the care plan section) Acute Rehab OT Goals Patient Stated Goal: Get these wires off and return home OT Goal Formulation: With patient Time For Goal Achievement: 01/19/22 Potential to Achieve Goals: Good  OT Frequency: Min 2X/week    Co-evaluation PT/OT/SLP Co-Evaluation/Treatment: Yes Reason  for Co-Treatment: Complexity of the patient's impairments (multi-system involvement);Necessary to address cognition/behavior during functional activity   OT goals addressed during session: ADL's and self-care      AM-PAC OT "6 Clicks" Daily Activity     Outcome Measure Help from another person eating meals?: None Help from another person taking care of personal grooming?: None   Help from another person bathing (including washing, rinsing, drying)?: A Little Help from another person to put on and taking off regular upper body clothing?: None Help from another person to put on and taking off regular lower body clothing?: A Little 6 Click Score: 18   End of Session Equipment Utilized During Treatment: Gait belt;Rolling walker (2 wheels)  Activity Tolerance: Patient tolerated treatment well Patient left: in chair;with call bell/phone within reach;with chair alarm set  OT Visit Diagnosis: Dizziness and giddiness (R42);Unsteadiness on feet (R26.81)                Time: 1140-1200 OT Time Calculation (min): 20 min Charges:  OT General Charges $OT Visit: 1 Visit OT Evaluation $OT Eval Moderate Complexity: 1 Mod  01/05/2022  RP, OTR/L  Acute Rehabilitation Services  Office:  [phone]   Metta Clines 01/05/2022, 1:02 PM

## 2022-01-05 NOTE — Care Management Important Message (Signed)
Important Message  Patient Details  Name: Andre Stout MRN: [id number] Date of Birth: [birth year]   Medicare Important Message Given:  Yes     Hannah Beat 01/05/2022, 11:47 AM

## 2022-01-08 ENCOUNTER — Ambulatory Visit: Payer: Medicare Other

## 2022-01-08 ENCOUNTER — Telehealth: Payer: Self-pay

## 2022-01-08 NOTE — Telephone Encounter (Signed)
Transition Care Management Follow-up Telephone Call Date of discharge and from where: 01/05/2022 -[place] How have you been since you were released from the hospital? Doing good Any questions or concerns? No  Items Reviewed: Did the pt receive and understand the discharge instructions provided? Yes  Medications obtained and verified? Yes  Other? Yes  Any new allergies since your discharge? No  Dietary orders reviewed? Yes Do you have support at home? Yes   Home Care and Equipment/Supplies: Were home health services ordered? yes If so, what is the name of the agency? [facility]  Has the agency set up a time to come to the patient's home? no Were any new equipment or medical supplies ordered?  No What is the name of the medical supply agency? N/a Were you able to get the supplies/equipment? not applicable Do you have any questions related to the use of the equipment or supplies? N/a  Functional Questionnaire: (I = Independent and D = Dependent) ADLs: I with assistance  Bathing/Dressing- I  Meal Prep- I with assistance  Eating- I  Maintaining continence- I  Transferring/Ambulation- I with walker  Managing Meds- I with assistance  Follow up appointments reviewed:  PCP Hospital f/u appt confirmed? Yes  Scheduled to see Jodi Mourning on 01/12/2022 @ 3:00 . [place] [facility] f/u appt confirmed? Yes  Scheduled to see Dr. Eulas Post on 02/12/2022 @ 3:35. Are transportation arrangements needed? No  If their condition worsens, is the pt aware to call PCP or go to the Emergency Dept.? Yes Was the patient provided with contact information for the PCP's office or ED? Yes Was to pt encouraged to call back with questions or concerns? Yes

## 2022-01-12 ENCOUNTER — Ambulatory Visit (INDEPENDENT_AMBULATORY_CARE_PROVIDER_SITE_OTHER): Payer: Medicare Other | Admitting: Family

## 2022-01-12 ENCOUNTER — Encounter: Payer: Self-pay | Admitting: Family

## 2022-01-12 VITALS — BP 160/70 | HR 48 | Temp 98.4°F | Ht 65.0 in | Wt 189.0 lb

## 2022-01-12 DIAGNOSIS — R55 Syncope and collapse: Secondary | ICD-10-CM | POA: Diagnosis not present

## 2022-01-12 DIAGNOSIS — R790 Abnormal level of blood mineral: Secondary | ICD-10-CM

## 2022-01-12 DIAGNOSIS — E876 Hypokalemia: Secondary | ICD-10-CM

## 2022-01-12 DIAGNOSIS — E1022 Type 1 diabetes mellitus with diabetic chronic kidney disease: Secondary | ICD-10-CM

## 2022-01-12 DIAGNOSIS — N183 Chronic kidney disease, stage 3 unspecified: Secondary | ICD-10-CM

## 2022-01-12 MED ORDER — POTASSIUM CHLORIDE CRYS ER 10 MEQ PO TBCR: 10.0000 meq | EXTENDED_RELEASE_TABLET | Freq: Every day | ORAL | 0 refills | Status: DC

## 2022-01-12 NOTE — Patient Instructions (Addendum)
[facility]   [place] [address]  Phone: [phone]  Fax: [phone]

## 2022-01-12 NOTE — Progress Notes (Signed)
Andre Stout is a 82 y.o. male with the following history as recorded in EpicCare:  Patient Active Problem List   Diagnosis Date Noted   Seizure ([place]) 01/02/2022   Abnormal EKG 01/02/2022   Hypokalemia 01/02/2022   Diabetes ([place]) 09/15/2021   Syncope and collapse 10/09/2020   Prostate cancer ([place]) 03/25/2019   Hypophosphatemia 03/06/2016   Hypomagnesemia 03/06/2016   Influenza A 03/05/2016   Benign essential HTN 03/05/2016   AKI (acute kidney injury) ([place]) 03/04/2016   Routine general medical examination at a health care facility 05/02/2015   Medicare annual wellness visit, subsequent 05/02/2015    Current Outpatient Medications  Medication Sig Dispense Refill   BD INSULIN SYRINGE U/F 31G X 5/16" 1 ML MISC USE 2 DAILY 200 each 4   brimonidine (ALPHAGAN) 0.2 % ophthalmic solution Place 1 drop into both eyes in the morning and at bedtime.     COMBIGAN 0.2-0.5 % ophthalmic solution Place 1 drop into both eyes 2 (two) times daily.  12   insulin NPH-regular Human (HUMULIN 70/30) (70-30) 100 UNIT/ML injection Inject 36 Units into the skin 2 (two) times daily with a meal. With breakfast and with supper. (Patient taking differently: Inject 36 Units into the skin 2 (two) times daily with a meal. 8am and 5pm) 20 mL 11   latanoprost (XALATAN) 0.005 % ophthalmic solution Place 1 drop into both eyes at bedtime.     Misc Natural Products (URINOZINC PO) Take 1 tablet by mouth daily.      Multiple Vitamins-Minerals (CENTRUM ADULTS PO) Take 1 tablet by mouth daily.      Omega 3 1200 MG CAPS Take 1,200 mg by mouth daily.     ONETOUCH DELICA LANCETS 24P MISC Use to check blood sugar 4 times per day. Dx code: E11.9 200 each 2   ONETOUCH ULTRA test strip USE TO MONITOR GLUCOSE LEVELS 4 TIMES PER DAY E11.9 100 each 2   potassium chloride (KLOR-CON M) 10 MEQ tablet Take 1 tablet (10 mEq total) by mouth daily. 5 tablet 0   aspirin 81 MG chewable tablet Chew 1 tablet (81 mg total) by mouth daily. (Patient  not taking: Reported on 01/12/2022) 30 tablet 0   atorvastatin (LIPITOR) 40 MG tablet Take 1 tablet (40 mg total) by mouth daily. (Patient not taking: Reported on 01/12/2022) 30 tablet 0   Cholecalciferol (VITAMIN D3) 2000 UNITS TABS Take 2,000 Units by mouth daily. (Patient not taking: Reported on 01/12/2022)     metoprolol tartrate (LOPRESSOR) 25 MG tablet Take 1 tablet (25 mg total) by mouth 2 (two) times daily. (Patient not taking: Reported on 01/12/2022) 60 tablet 11   No current facility-administered medications for this visit.    Allergies: Patient has no known allergies.  Past Medical History:  Diagnosis Date   Diabetes mellitus without complication ([place])    Glaucoma    Prostate cancer ([place])    Been 3-4 years ago    Past Surgical History:  Procedure Laterality Date   CATARACT EXTRACTION Bilateral     Family History  Problem Relation Age of Onset   Diabetes Father    Diabetes Paternal Grandfather     Social History   Tobacco Use   Smoking status: Former    Packs/day: 0.50    Years: 4.00    Pack years: 2.00    Types: Cigarettes    Quit date: 10/29/1974    Years since quitting: 47.2   Smokeless tobacco: Never  Substance Use Topics  Alcohol use: No    Subjective:   Accompanied by daughter today; was admitted to the hospital with seizure from 1/3/022-01/05/2021; felt to be related to low potassium/ low magnesium and low blood sugar; Patient notes he has been treating himself with OTC potassium supplement for the past year because he had initially had leg cramps; admits he had not been on his supplement for at least 1-2 weeks to episode resulting in seizure.   Is also wearing Zio monitor per cardiology; will be wearing x 1 more week; is concerned about taking Lopressor that was prescribed as he has had bad reaction to this medication in the past;      Objective:  Vitals:   01/12/22 1505  BP: (!) 160/70  Pulse: (!) 48  Temp: 98.4 F (36.9 C)  TempSrc: Oral  SpO2:  99%  Weight: 189 lb (85.7 kg)  Height: 5' 5" (1.651 m)    General: Well developed, well nourished, in no acute distress  Skin : Warm and dry.  Head: Normocephalic and atraumatic  Eyes: Sclera and conjunctiva clear; pupils round and reactive to light; extraocular movements intact  Ears: External normal; canals clear; tympanic membranes normal  Oropharynx: Pink, supple. No suspicious lesions  Neck: Supple without thyromegaly, adenopathy  Lungs: Respirations unlabored; clear to auscultation bilaterally without wheeze, rales, rhonchi  CVS exam: normal rate and regular rhythm.  Neurologic: Alert and oriented; speech intact; face symmetrical; uses cane for support today;  Assessment:  1. Hypokalemia   2. Low magnesium level   3. 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 Syncope and collapse     Plan:  Patient had been taking OTC potassium/ magnesium supplement but had never discussed with providers or had documented on his medication list; he had stopped this supplement 1-2 weeks prior to seizure;  Discussed concern for taking OTC potassium supplements and will plan to prescribe prescriptive potassium going forward; for upcoming weekend, he can take 10 meq daily; he understands NOT to use OTC supplement anymore.  Keep planned follow up with cardiology and endocrinology; agree with holding Lopressor for now; patient defers taking Atorvastatin but does agree to start baby ASA;  Contact information for Home Health provided as well; daughter plans to call to follow up on this order.   Time spent 30 minutes  This visit occurred during the SARS-CoV-2 public health emergency.  Safety protocols were in place, including screening questions prior to the visit, additional usage of staff PPE, and extensive cleaning of exam room while observing appropriate contact time as indicated for disinfecting solutions.    No follow-ups on  file.  Orders Placed This Encounter  Procedures   Comp Met (CMET)   CBC with Differential/Platelet   Magnesium    Requested Prescriptions   Signed Prescriptions Disp Refills   potassium chloride (KLOR-CON M) 10 MEQ tablet 5 tablet 0    Sig: Take 1 tablet (10 mEq total) by mouth daily.

## 2022-01-13 LAB — COMPREHENSIVE METABOLIC PANEL
AG Ratio: 1.1 (calc) (ref 1.0–2.5)
ALT: 10 U/L (ref 9–46)
AST: 13 U/L (ref 10–35)
Albumin: 3.9 g/dL (ref 3.6–5.1)
Alkaline phosphatase (APISO): 79 U/L (ref 35–144)
BUN: 9 mg/dL (ref 7–25)
CO2: 26 mmol/L (ref 20–32)
Calcium: 10.3 mg/dL (ref 8.6–10.3)
Chloride: 103 mmol/L (ref 98–110)
Creat: 1.11 mg/dL (ref 0.70–1.22)
Globulin: 3.5 g/dL (calc) (ref 1.9–3.7)
Glucose, Bld: 124 mg/dL — ABNORMAL HIGH (ref 65–99)
Potassium: 5.2 mmol/L (ref 3.5–5.3)
Sodium: 138 mmol/L (ref 135–146)
Total Bilirubin: 0.7 mg/dL (ref 0.2–1.2)
Total Protein: 7.4 g/dL (ref 6.1–8.1)

## 2022-01-13 LAB — CBC WITH DIFFERENTIAL/PLATELET
Absolute Monocytes: 1015 cells/uL — ABNORMAL HIGH (ref 200–950)
Basophils Absolute: 94 cells/uL (ref 0–200)
Basophils Relative: 1 %
Eosinophils Absolute: 451 cells/uL (ref 15–500)
Eosinophils Relative: 4.8 %
HCT: 47.8 % (ref 38.5–50.0)
Hemoglobin: 15.7 g/dL (ref 13.2–17.1)
Lymphs Abs: 3224 cells/uL (ref 850–3900)
MCH: 27.4 pg (ref 27.0–33.0)
MCHC: 32.8 g/dL (ref 32.0–36.0)
MCV: 83.3 fL (ref 80.0–100.0)
MPV: 11.5 fL (ref 7.5–12.5)
Monocytes Relative: 10.8 %
Neutro Abs: 4615 cells/uL (ref 1500–7800)
Neutrophils Relative %: 49.1 %
Platelets: 301 10*3/uL (ref 140–400)
RBC: 5.74 10*6/uL (ref 4.20–5.80)
RDW: 13.7 % (ref 11.0–15.0)
Total Lymphocyte: 34.3 %
WBC: 9.4 10*3/uL (ref 3.8–10.8)

## 2022-01-13 LAB — MAGNESIUM: Magnesium: 1.5 mg/dL (ref 1.5–2.5)

## 2022-01-15 ENCOUNTER — Telehealth: Payer: Self-pay

## 2022-01-15 NOTE — Telephone Encounter (Signed)
I attempted to contact patient, call home number with no option for voicemail, and called cell number- LVM to call back to discuss.  Left call back number.

## 2022-01-15 NOTE — Telephone Encounter (Signed)
-----   Message from Geralynn Rile, MD sent at 01/13/2022  1:14 PM EST ----- Thanks!  Almyra Free -> Can we call him? He has known intermittent heart block but EP was holding off on a pacer. If he is not having symptoms, can continue with scheduled appointment.   [place] T. Audie Box, MD, [place]  [address] [place], [place] [address] [phone]  1:15 PM  ----- Message ----- From: Tannenbaum, [place], MD Sent: 01/13/2022   3:16 AM EST To: Geralynn Rile, MD  Good morning,  I was notified last night that Mr Mcdaniel had 6 seconds of CHB on 12/17/2021 on his Zio; no further episodes following this  Thanks, -[place]

## 2022-01-18 ENCOUNTER — Encounter: Payer: Self-pay | Admitting: *Deleted

## 2022-01-18 NOTE — Progress Notes (Signed)
Patient ID: Andre Stout, male   DOB: 15-Mar-1940, 82 y.o.   MRN: [id number] Hello, We are contacting you about one of your patients whose Zio AT monitor is nearing the maximum threshold for auto-triggered, asymptomatic event transmissions.   The serial number is [id number] Patient initials are B.G.   Important: Once your patients Zio AT monitor reaches the maximum limit of 500 auto-triggers, the monitor will no longer transmit auto-triggered, asymptomatic events to iRhythm. This means we could miss potential clinically actionable arrhythmias during the rest of the patients wear period unless the monitor is promptly replaced. In order to continue receiving auto-triggered, asymptomatic event transmissions, we need to send another Zio AT monitor to your patient.   We attempted to call your office today to confirm if you would like [place] to ship another Zio AT monitor. We were unable to reach you, so out of caution to avoid a gap in monitoring, we have placed an order to ship another Zio AT monitor to your patient to apply at home, at no additional cost to you or your patient. Please contact us immediately if you do not agree with this decision.   Please note:  The Zio AT monitor, by design, can transmit a maximum of 100 Patient-Triggered, and 500 Auto-Triggered events. When the limit is reached for each type of transmission, the monitor can no longer transmit that type of event.  Unless the monitor is promptly replaced, there could be a time during the wear period in which auto-triggered, asymptomatic events are recorded on the device itself, but not transmitted to iRhythm prior to end of wear.  The patients current device will still record and save continuous ECG locally, and all asymptomatic strips will be provided on the Final Report.  Unless both limits are reached, a transmission will still be sent to iRhythm if the patient presses the button on the patch to indicate a symptom. If you have any  questions, please feel free to contact us at [phone] and reference: [id number]  Thank you,  [place]

## 2022-01-19 ENCOUNTER — Telehealth: Payer: Self-pay | Admitting: Family

## 2022-01-19 DIAGNOSIS — R9431 Abnormal electrocardiogram [ECG] [EKG]: Secondary | ICD-10-CM | POA: Diagnosis not present

## 2022-01-19 DIAGNOSIS — R55 Syncope and collapse: Secondary | ICD-10-CM | POA: Diagnosis not present

## 2022-01-19 NOTE — Telephone Encounter (Signed)
Patient's daughter is calling to make Mickel Baas aware that the hospital has not sent the orders for PT/OT yet, and her dad has been discharged for over 2 week already. She would like to know if Mickel Baas would send the others. She would also like to know if she's going to put pt on magnesium or potasium. She can be reached at [phone]. Please advice.

## 2022-01-19 NOTE — Telephone Encounter (Signed)
Are we able to do the Pt/OT orders?   I will also let him know about the results. I did mail letter to his home.

## 2022-01-21 ENCOUNTER — Encounter: Payer: Self-pay | Admitting: Family

## 2022-01-22 ENCOUNTER — Other Ambulatory Visit: Payer: Self-pay | Admitting: Family

## 2022-01-22 DIAGNOSIS — M25512 Pain in left shoulder: Secondary | ICD-10-CM

## 2022-01-22 DIAGNOSIS — M25522 Pain in left elbow: Secondary | ICD-10-CM

## 2022-01-22 MED ORDER — MAGNESIUM OXIDE 400 MG PO CAPS: 400.0000 mg | ORAL_CAPSULE | Freq: Every day | ORAL | 0 refills | Status: DC

## 2022-01-22 NOTE — Telephone Encounter (Signed)
I have called pt and spoke to his daughter. They have made a lab visit for 02/26/22 @ 11am. Please place the future labs since they do want to get it drawn here.   I have relayed the lab results to daughter and she had some questions. They saw that the magnesium is on the lower side of normal and they want to ensure that they do not need to be taking anything for that at this time. They are also requesting PT/OT orders since the hospital did not place any orders.   Pt now reports left shoulder pain and in the same arm he had the bursitis or elbow pain. They are wondering if they need to be seen or can they have an orthopedics referral?   Please assist.  Thanks, Andre Stout

## 2022-01-23 ENCOUNTER — Other Ambulatory Visit: Payer: Self-pay | Admitting: Family

## 2022-01-23 DIAGNOSIS — R55 Syncope and collapse: Secondary | ICD-10-CM

## 2022-01-23 NOTE — Telephone Encounter (Signed)
I have called pt and relayed half of the information and the phone got disconnected. I have tried to call him back again and I was unable to reach him since the phone went to the busy signal.   I have then called pt daughter and I was able to relay the message from provider. The magnesium has been sent to pharmacy and the home health orders have been placed. We have also sent a referral to an orthopedics in [place].   Pt daughter stated understanding and no further questions at this time.

## 2022-01-24 ENCOUNTER — Other Ambulatory Visit: Payer: Self-pay

## 2022-01-29 ENCOUNTER — Telehealth: Payer: Self-pay | Admitting: Family

## 2022-01-29 NOTE — Telephone Encounter (Signed)
Caller/Agency: [facility] Number: [phone] Requesting OT/PT/Skilled Nursing/Social Work/Speech Therapy: PT Frequency: 2 x 2, 1 x 3, every other week for 4 week And skilled nurse evaluation

## 2022-01-30 ENCOUNTER — Telehealth: Payer: Self-pay | Admitting: *Deleted

## 2022-01-30 NOTE — Telephone Encounter (Signed)
Hello, We are contacting you about one of your patients whose Zio AT monitor is nearing the maximum threshold for auto-triggered, asymptomatic event transmissions.  The serial number is [id number] Patient initials are B G   Important: Once your patients Zio AT monitor reaches the maximum limit of 500 auto-triggers, the monitor will no longer transmit auto-triggered, asymptomatic events to iRhythm. This means we could miss potential clinically actionable arrhythmias during the rest of the patients wear period unless the monitor is promptly replaced. In order to continue receiving auto-triggered, asymptomatic event transmissions, we need to send another Zio AT monitor to your patient.   We attempted to call your office today to confirm if you would like [place] to ship another Zio AT monitor. We were unable to reach you, so out of caution to avoid a gap in monitoring, we have proceeded to place an order to ship another Zio AT monitor to your patient to apply at home, at no additional cost to you or your patient. Please contact us immediately if you do not agree with this decision.   Action Required: You will need to create an additional order in your EHR for the new device listed below. This is to facilitate integration of data from additional devices into the Final Report.   Please note:   The Zio AT monitor, by design, can transmit a maximum of 100 Patient-Triggered, and 500 Auto-Triggered events. When the limit is reached for each type of transmission, the monitor can no longer transmit that type of event.   Unless the monitor is promptly replaced, there could be a time during the wear period in which auto-triggered, asymptomatic events are recorded on the device itself, but not transmitted to iRhythm prior to end of wear.   The patients current device will still record and save continuous ECG locally, and all asymptomatic strips will be provided on the Final Report.   Unless both limits are  reached, a transmission will still be sent to iRhythm if the patient presses the button on the patch to indicate a symptom.     If you have any questions, please feel free to contact us at [phone] and reference: [phone]   Thank you,  [facility] Customer Care

## 2022-01-30 NOTE — Telephone Encounter (Signed)
Attempted to call back [place] and there was no answer so I left a message to call back.

## 2022-01-30 NOTE — Telephone Encounter (Signed)
Mickel Baas called back and Verbal orders given.

## 2022-02-01 ENCOUNTER — Other Ambulatory Visit: Payer: Self-pay

## 2022-02-01 ENCOUNTER — Ambulatory Visit (INDEPENDENT_AMBULATORY_CARE_PROVIDER_SITE_OTHER): Payer: Medicare Other | Admitting: Orthopaedic Surgery

## 2022-02-01 ENCOUNTER — Ambulatory Visit: Payer: Self-pay

## 2022-02-01 ENCOUNTER — Encounter: Payer: Self-pay | Admitting: Orthopaedic Surgery

## 2022-02-01 DIAGNOSIS — M25512 Pain in left shoulder: Secondary | ICD-10-CM | POA: Diagnosis not present

## 2022-02-01 DIAGNOSIS — G8929 Other chronic pain: Secondary | ICD-10-CM

## 2022-02-01 MED ORDER — DICLOFENAC SODIUM 75 MG PO TBEC: 75.0000 mg | DELAYED_RELEASE_TABLET | Freq: Two times a day (BID) | ORAL | 2 refills | Status: DC | PRN

## 2022-02-01 NOTE — Progress Notes (Signed)
Office Visit Note   Patient: Andre Stout           Date of Birth: [birth year]           MRN: [id number] Visit Date: 02/01/2022              Requested by: [name], [place] [address],  [place] [address] PCP: [name], FNP   Assessment & Plan: Visit Diagnoses:  1. Chronic left shoulder pain     Plan: Impression is left shoulder rotator cuff tendinitis/subacromial bursitis.  Today, we discussed various treatment options to include continuation of NSAIDs versus cortisone injection.  He would like to try cortisone injection.  I will also provide him with a [place]obe exercise program.  Should his symptoms not improve over the next several weeks he will likely order MRI of the left shoulder.  Call with concerns or questions in the interim.  Follow-Up Instructions: Return if symptoms worsen or fail to improve.   Orders:  Orders Placed This Encounter  Procedures   Large Joint Inj   XR Shoulder Left   No orders of the defined types were placed in this encounter.     Procedures: Large Joint Inj: L subacromial bursa on 02/01/2022 11:34 AM Indications: pain Details: 22 G needle Medications: 2 mL lidocaine 2 %; 2 mL bupivacaine 0.25 %; 40 mg methylPREDNISolone acetate 40 MG/ML Outcome: tolerated well, no immediate complications Patient was prepped and draped in the usual sterile fashion.      Clinical Data: No additional findings.   Subjective: Chief Complaint  Patient presents with   Left Shoulder - Pain    HPI patient is a pleasant 82 year old gentleman who comes in today with left shoulder pain for the past month.  He denies any injury or change in activity.  The pain is primarily at the top of his shoulder with occasional radiation into the deltoid.  Pain is worse with any motion of the shoulder specifically when he is putting on his jacket.  He has been taking an occasional aspirin with mild relief of symptoms.  He denies  any paresthesias to the left upper extremity.  No previous cortisone injection left shoulder.  No history of neck pain.  Review of Systems as detailed in HPI.  All others reviewed and are negative.   Objective: Vital Signs: There were no vitals taken for this visit.  Physical Exam well-developed well-nourished gentleman in no acute distress.  Alert and oriented x3.  Ortho Exam left shoulder exam shows forward flexion abduction to approximately 90 degrees.  He can internally rotate to his back pocket. Positive empty can test.  Negative cross body test.  Negative speeds negative O'Brien's.  No tenderness to the [facility] joint.  Negative belly press.  Full strength throughout.  He is neurovascular tact distally.  Specialty Comments:  No specialty comments available.  Imaging: No results found.   PMFS History: Patient Active Problem List   Diagnosis Date Noted   Seizure ([place]) 01/02/2022   Abnormal EKG 01/02/2022   Hypokalemia 01/02/2022   Diabetes ([place]) 09/15/2021   Syncope and collapse 10/09/2020   Prostate cancer ([place]) 03/25/2019   Hypophosphatemia 03/06/2016   Hypomagnesemia 03/06/2016   Influenza A 03/05/2016   Benign essential HTN 03/05/2016   AKI (acute kidney injury) ([place]) 03/04/2016   Routine general medical examination at a health care facility 05/02/2015   Medicare annual wellness visit, subsequent 05/02/2015   Past Medical History:  Diagnosis Date   Diabetes mellitus without complication ([place])    Glaucoma    Prostate cancer ([place])    Been 3-4 years ago    Family History  Problem Relation Age of Onset   Diabetes Father    Diabetes Paternal Grandfather     Past Surgical History:  Procedure Laterality Date   CATARACT EXTRACTION Bilateral    Social History   Occupational History   Occupation: Retired  Tobacco Use   Smoking status: Former    Packs/day: 0.50    Years: 4.00    Pack years: 2.00    Types: Cigarettes    Quit date: 10/29/1974    Years since  quitting: 47.2   Smokeless tobacco: Never  Vaping Use   Vaping Use: Never used  Substance and Sexual Activity   Alcohol use: No   Drug use: No   Sexual activity: Not Currently

## 2022-02-02 MED ORDER — BUPIVACAINE HCL 0.25 % IJ SOLN
2.0000 mL | INTRAMUSCULAR | Status: AC | PRN
  Administered 2022-02-01: 2 mL via INTRA_ARTICULAR

## 2022-02-02 MED ORDER — METHYLPREDNISOLONE ACETATE 40 MG/ML IJ SUSP
40.0000 mg | INTRAMUSCULAR | Status: AC | PRN
  Administered 2022-02-01: 40 mg via INTRA_ARTICULAR

## 2022-02-02 MED ORDER — LIDOCAINE HCL 2 % IJ SOLN
2.0000 mL | INTRAMUSCULAR | Status: AC | PRN
  Administered 2022-02-01: 2 mL

## 2022-02-07 ENCOUNTER — Ambulatory Visit (INDEPENDENT_AMBULATORY_CARE_PROVIDER_SITE_OTHER): Payer: Medicare Other | Admitting: Endocrinology

## 2022-02-07 ENCOUNTER — Other Ambulatory Visit: Payer: Self-pay

## 2022-02-07 VITALS — BP 140/74 | HR 76 | Ht 65.0 in | Wt 195.0 lb

## 2022-02-07 DIAGNOSIS — N183 Chronic kidney disease, stage 3 unspecified: Secondary | ICD-10-CM | POA: Diagnosis not present

## 2022-02-07 DIAGNOSIS — E1022 Type 1 diabetes mellitus with diabetic chronic kidney disease: Secondary | ICD-10-CM | POA: Diagnosis not present

## 2022-02-07 LAB — POCT GLYCOSYLATED HEMOGLOBIN (HGB A1C): Hemoglobin A1C: 8.9 % — AB (ref 4.0–5.6)

## 2022-02-07 NOTE — Progress Notes (Signed)
Subjective:    Patient ID: Andre Stout, male    DOB: 10-May-1940, 82 y.o.   MRN: [id number]  HPI Pt returns for f/u of diabetes mellitus:  DM type: 1  Dx'ed: 3094 Complications: DR and stage 3 CRI.  Therapy: insulin since 2006.  DKA: once, in 2017.  Severe hypoglycemia: 2015 and 2021.   Pancreatitis: never.   SDOH: He is not a pump candidate, due to not recording cbg's; he takes human insulin, due to cost.   Other: emphasizing basal insulin did not help; he did not tolerate pioglitizone (edema); he takes BID insulin, after poor results with multiple daily injections; he was changed to 70/30, due to pattern of cbg's.   Interval history: Pt says he does not miss insulin doses.  pt states he feels better in general. no cbg record, but states cbg varies from 100-200.  There is no trend throughout the day.  Last month, pt was in ER with Sz, but glucose was 112 by EMS.   Past Medical History:  Diagnosis Date   Diabetes mellitus without complication ([place])    Glaucoma    Prostate cancer ([place])    Been 3-4 years ago    Past Surgical History:  Procedure Laterality Date   CATARACT EXTRACTION Bilateral     Social History   Socioeconomic History   Marital status: Widowed    Spouse name: Not on file   Number of children: 1   Years of education: 25   Highest education level: Not on file  Occupational History   Occupation: Retired  Tobacco Use   Smoking status: Former    Packs/day: 0.50    Years: 4.00    Pack years: 2.00    Types: Cigarettes    Quit date: 10/29/1974    Years since quitting: 47.3   Smokeless tobacco: Never  Vaping Use   Vaping Use: Never used  Substance and Sexual Activity   Alcohol use: No   Drug use: No   Sexual activity: Not Currently  Other Topics Concern   Not on file  Social History Narrative   Born and raised in [place], [place]. Currently reside in a private residence by himself. Daughter lives in the area. No live. Fun: hunt and fish   Denies  religious beliefs that would effect health care.    Social Determinants of Health   Financial Resource Strain: Not on file  Food Insecurity: Not on file  Transportation Needs: Not on file  Physical Activity: Not on file  Stress: Not on file  Social Connections: Not on file  Intimate Partner Violence: Not on file    Current Outpatient Medications on File Prior to Visit  Medication Sig Dispense Refill   aspirin 81 MG chewable tablet Chew 1 tablet (81 mg total) by mouth daily. 30 tablet 0   BD INSULIN SYRINGE U/F 31G X 5/16" 1 ML MISC USE 2 DAILY 200 each 4   brimonidine (ALPHAGAN) 0.2 % ophthalmic solution Place 1 drop into both eyes in the morning and at bedtime.     Cholecalciferol (VITAMIN D3) 2000 UNITS TABS Take 2,000 Units by mouth daily.     COMBIGAN 0.2-0.5 % ophthalmic solution Place 1 drop into both eyes 2 (two) times daily.  12   diclofenac (VOLTAREN) 75 MG EC tablet Take 1 tablet (75 mg total) by mouth 2 (two) times daily as needed. 60 tablet 2   insulin NPH-regular Human (HUMULIN 70/30) (70-30) 100 UNIT/ML injection Inject 36 Units into the  skin 2 (two) times daily with a meal. With breakfast and with supper. (Patient taking differently: Inject 36 Units into the skin 2 (two) times daily with a meal. 8am and 5pm) 20 mL 11   latanoprost (XALATAN) 0.005 % ophthalmic solution Place 1 drop into both eyes at bedtime.     Magnesium Oxide 400 MG CAPS Take 1 capsule (400 mg total) by mouth daily. 90 capsule 0   Misc Natural Products (URINOZINC PO) Take 1 tablet by mouth daily.      Multiple Vitamins-Minerals (CENTRUM ADULTS PO) Take 1 tablet by mouth daily.      Omega 3 1200 MG CAPS Take 1,200 mg by mouth daily.     ONETOUCH DELICA LANCETS 81J MISC Use to check blood sugar 4 times per day. Dx code: E11.9 200 each 2   ONETOUCH ULTRA test strip USE TO MONITOR GLUCOSE LEVELS 4 TIMES PER DAY E11.9 100 each 2   atorvastatin (LIPITOR) 40 MG tablet Take 1 tablet (40 mg total) by mouth daily.  (Patient not taking: Reported on 01/12/2022) 30 tablet 0   metoprolol tartrate (LOPRESSOR) 25 MG tablet Take 1 tablet (25 mg total) by mouth 2 (two) times daily. (Patient not taking: Reported on 01/12/2022) 60 tablet 11   potassium chloride (KLOR-CON M) 10 MEQ tablet Take 1 tablet (10 mEq total) by mouth daily. 5 tablet 0   No current facility-administered medications on file prior to visit.    No Known Allergies  Family History  Problem Relation Age of Onset   Diabetes Father    Diabetes Paternal Grandfather     BP 140/74    Pulse 76    Ht 5\' 5"  (1.651 m)    Wt 195 lb (88.5 kg)    SpO2 97%    BMI 32.45 kg/m    Review of Systems     Objective:   Physical Exam    Lab Results  Component Value Date   HGBA1C 8.9 (A) 02/07/2022      Assessment & Plan:  Type 1 DM: uncontrolled.  Sz, uncertain etiology and prognosis.  Despite cbg of 112 on the scene, hypoglycemia should be regarded as a possibility.    Patient Instructions  check your blood sugar twice a day.  vary the time of day when you check, between before the 3 meals, and at bedtime.  also check if you have symptoms of your blood sugar being too high or too low.  please keep a record of the readings and bring it to your next appointment here.  You can write it on any piece of paper.  please call us sooner if your blood sugar goes below 70, or if you have a lot of readings over 200.   We are placing another sensor today.   On this type of insulin schedule, you should eat meals on a regular schedule (especially lunch).  If a meal is missed or significantly delayed, your blood sugar could go low.   Please come back for a follow-up appointment in 2 weeks.

## 2022-02-07 NOTE — Patient Instructions (Addendum)
check your blood sugar twice a day.  vary the time of day when you check, between before the 3 meals, and at bedtime.  also check if you have symptoms of your blood sugar being too high or too low.  please keep a record of the readings and bring it to your next appointment here.  You can write it on any piece of paper.  please call us sooner if your blood sugar goes below 70, or if you have a lot of readings over 200.   We are placing another sensor today.   On this type of insulin schedule, you should eat meals on a regular schedule (especially lunch).  If a meal is missed or significantly delayed, your blood sugar could go low.   Please come back for a follow-up appointment in 2 weeks.

## 2022-02-12 ENCOUNTER — Other Ambulatory Visit: Payer: Self-pay

## 2022-02-12 ENCOUNTER — Encounter: Payer: Self-pay | Admitting: Nurse Practitioner

## 2022-02-12 ENCOUNTER — Ambulatory Visit (INDEPENDENT_AMBULATORY_CARE_PROVIDER_SITE_OTHER): Payer: Medicare Other | Admitting: Nurse Practitioner

## 2022-02-12 VITALS — BP 150/80 | HR 61 | Ht 65.0 in | Wt 198.4 lb

## 2022-02-12 DIAGNOSIS — I441 Atrioventricular block, second degree: Secondary | ICD-10-CM | POA: Diagnosis not present

## 2022-02-12 DIAGNOSIS — E876 Hypokalemia: Secondary | ICD-10-CM | POA: Diagnosis not present

## 2022-02-12 DIAGNOSIS — R55 Syncope and collapse: Secondary | ICD-10-CM

## 2022-02-12 DIAGNOSIS — I442 Atrioventricular block, complete: Secondary | ICD-10-CM | POA: Diagnosis not present

## 2022-02-12 DIAGNOSIS — I10 Essential (primary) hypertension: Secondary | ICD-10-CM

## 2022-02-12 DIAGNOSIS — E118 Type 2 diabetes mellitus with unspecified complications: Secondary | ICD-10-CM

## 2022-02-12 NOTE — Patient Instructions (Signed)
Medication Instructions:  Your physician recommends that you continue on your current medications as directed. Please refer to the Current Medication list given to you today.   *If you need a refill on your cardiac medications before your next appointment, please call your pharmacy*   Lab Work: NONE ordered at this time of appointment   If you have labs (blood work) drawn today and your tests are completely normal, you will receive your results only by: [place] (if you have MyChart) OR A paper copy in the mail If you have any lab test that is abnormal or we need to change your treatment, we will call you to review the results.   Testing/Procedures: NONE ordered at this time of appointment    Follow-Up: At [facility], you and your health needs are our priority.  As part of our continuing mission to provide you with exceptional heart care, we have created designated Provider Care Teams.  These Care Teams include your primary Cardiologist (physician) and Advanced Practice Providers (APPs -  Physician Assistants and Nurse Practitioners) who all work together to provide you with the care you need, when you need it.  We recommend signing up for the patient portal called "MyChart".  Sign up information is provided on this After Visit Summary.  MyChart is used to connect with patients for Virtual Visits (Telemedicine).  Patients are able to view lab/test results, encounter notes, upcoming appointments, etc.  Non-urgent messages can be sent to your provider as well.   To learn more about what you can do with MyChart, go to [email or web address].    Your next appointment:   3 month(s)  The format for your next appointment:   In Person  Provider:   Evalina Field, MD  or  APP     Other Instructions Monitor Blood pressure and report if consistently greater than 130/80

## 2022-02-12 NOTE — Addendum Note (Signed)
Addended by: Dionicio Stall E on: 02/12/2022 04:59 PM   Modules accepted: Orders

## 2022-02-12 NOTE — Progress Notes (Signed)
Office Visit    Patient Name: Andre Stout Date of Encounter: 02/12/2022  Primary Care Provider:  [name], [place] Primary Cardiologist:  Evalina Field, MD  Chief Complaint    82 year old male with a history of syncope, complete heart block, hypertension, prostate cancer, and type 2 diabetes who presents for follow-up related to syncope.  Past Medical History    Past Medical History:  Diagnosis Date   Diabetes mellitus without complication ([place])    Glaucoma    Prostate cancer ([place])    Been 3-4 years ago   Past Surgical History:  Procedure Laterality Date   CATARACT EXTRACTION Bilateral     Allergies  No Known Allergies  History of Present Illness    81 year old male with the above past medical history including syncope, complete heart block, hypertension, prostate cancer, and type 2 diabetes.  He presented to the emergency room on 01/02/2022 following a syncopal episode.  EMS reported seizure-like activity.  He was hospitalized 1 01/02/2022 to 01/05/2022.  CT of the head was negative for acute process. EKG showed A-V dissociation and cardiology was consulted.  Prior EKGs with noted prolonged PR interval, LAFB.  He was hypokalemic at the time, magnesium was also low, both were supplemented.  Echocardiogram showed EF 55 to 60%, normal LV function, mild to moderate hypertrophy of basal septum, mild LVH, grade 1 DD, arctic valve sclerosis without evidence of stenosis, no significant change from prior echo.  EP was consulted.  He regained 1:1 conduction and was discharged home with outpatient cardiac monitor.  He presents today for follow-up accompanied by his daughter.  Since his hospitalization he has been stable from a cardiac standpoint.  He denies any further syncope, presyncope, dizziness, palpitations.  He did see his PCP on January 12, 2022.  Heart rate was low at the time, metoprolol was discontinued.  He reports chronic dependent lower extremity edema. Denies  dyspnea, pnd, orthopnea. Overall, he reports feeling well and denies any specific concerns or complaints today.  Home Medications    Current Outpatient Medications  Medication Sig Dispense Refill   aspirin 81 MG chewable tablet Chew 1 tablet (81 mg total) by mouth daily. 30 tablet 0   BD INSULIN SYRINGE U/F 31G X 5/16" 1 ML MISC USE 2 DAILY 200 each 4   brimonidine (ALPHAGAN) 0.2 % ophthalmic solution Place 1 drop into both eyes in the morning and at bedtime.     COMBIGAN 0.2-0.5 % ophthalmic solution Place 1 drop into both eyes 2 (two) times daily.  12   insulin NPH-regular Human (HUMULIN 70/30) (70-30) 100 UNIT/ML injection Inject 36 Units into the skin 2 (two) times daily with a meal. With breakfast and with supper. (Patient taking differently: Inject 36 Units into the skin 2 (two) times daily with a meal. 8am and 5pm) 20 mL 11   latanoprost (XALATAN) 0.005 % ophthalmic solution Place 1 drop into both eyes at bedtime.     Magnesium Oxide 400 MG CAPS Take 1 capsule (400 mg total) by mouth daily. 90 capsule 0   ONETOUCH DELICA LANCETS 60V MISC Use to check blood sugar 4 times per day. Dx code: E11.9 200 each 2   ONETOUCH ULTRA test strip USE TO MONITOR GLUCOSE LEVELS 4 TIMES PER DAY E11.9 100 each 2   Cholecalciferol (VITAMIN D3) 2000 UNITS TABS Take 2,000 Units by mouth daily. (Patient not taking: Reported on 02/12/2022)     diclofenac (VOLTAREN) 75 MG EC tablet Take 1  tablet (75 mg total) by mouth 2 (two) times daily as needed. (Patient not taking: Reported on 02/12/2022) 60 tablet 2   Misc Natural Products (URINOZINC PO) Take 1 tablet by mouth daily.  (Patient not taking: Reported on 02/12/2022)     Multiple Vitamins-Minerals (CENTRUM ADULTS PO) Take 1 tablet by mouth daily.  (Patient not taking: Reported on 02/12/2022)     Omega 3 1200 MG CAPS Take 1,200 mg by mouth daily. (Patient not taking: Reported on 02/12/2022)     No current facility-administered medications for this visit.     Review  of Systems   He denies chest pain, palpitations, dyspnea, pnd, orthopnea, n, v, dizziness, syncope, weight gain, or early satiety. All other systems reviewed and are otherwise negative except as noted above.    Physical Exam    VS:  BP (!) 150/80    Pulse 61    Ht 5\' 5"  (1.651 m)    Wt 198 lb 6.4 oz (90 kg)    SpO2 98%    BMI 33.02 kg/m   GEN: Well nourished, well developed, in no acute distress. HEENT: normal. Neck: Supple, no JVD, carotid bruits, or masses. Cardiac: RRR, no murmurs, rubs, or gallops. No clubbing, cyanosis, bilateral lower extremity edema.  Radials/DP/PT 2+ and equal bilaterally.  Respiratory:  Respirations regular and unlabored, clear to auscultation bilaterally. GI: Soft, nontender, nondistended, BS + x 4. MS: no deformity or atrophy. Skin: warm and dry, no rash. Neuro:  Strength and sensation are intact. Psych: Normal affect.  Accessory Clinical Findings    ECG personally reviewed by me today - Second degree heart block, type I, 61 bpm, PVC, LAD - no acute changes.  Lab Results  Component Value Date   WBC 9.4 01/12/2022   HGB 15.7 01/12/2022   HCT 47.8 01/12/2022   MCV 83.3 01/12/2022   PLT 301 01/12/2022   Lab Results  Component Value Date   CREATININE 1.11 01/12/2022   BUN 9 01/12/2022   NA 138 01/12/2022   K 5.2 01/12/2022   CL 103 01/12/2022   CO2 26 01/12/2022   Lab Results  Component Value Date   ALT 10 01/12/2022   AST 13 01/12/2022   ALKPHOS 50 01/02/2022   BILITOT 0.7 01/12/2022   Lab Results  Component Value Date   CHOL 185 01/03/2022   HDL 51 01/03/2022   LDLCALC 104 (H) 01/03/2022   LDLDIRECT 121.0 03/25/2019   TRIG 151 (H) 01/03/2022   CHOLHDL 3.6 01/03/2022    Lab Results  Component Value Date   HGBA1C 8.9 (A) 02/07/2022    Assessment & Plan   1. H/o complete heart block/second-degree, [place] type I: Admitted to the ED in early January following syncopal episode.  EKG showed A-V dissociation.  Cardiology and  subsequently EP were consulted. Outpatient monitor incomplete preliminary results, reviewed with Dr. Audie Box, show 3228 episodes of AV block, possible third-degree though looking at this closely with irregular QRS intervals, appears to be mostly [place]. Min HR 40 bpm, max HR 118 bpm, average HR 73 bpm. Patient triggered events did not correlate with heart block. Will refer to EP for follow-up for final review of monitor results.  He remains asymptomatic, denies any recurrent syncope, presyncope, dizziness, palpitations.  Continue to hold beta-blocker.  2. Electrolyte abnormalities: Magnesium and potassium were low during recent hospitalization. Repeat labs post hospitalization were stable. Supplemented and monitored per PCP.  He is scheduled for repeat labs with PCP at the end of this month.  3. Syncope: Likely in the setting of heart block as above, electrolyte abnormalities, questionable seizure activity. CT head was unremarkable. No further syncope. Heart monitor reviewed in office today, no prolonged pauses or third-degree heart block to explain recent syncope. Will schedule follow-up with EP service to review monitor as well for questionable third degree heart block.  Stable.   4. Hypertension: BP elevated slightly above goal in office today.  He will continue to monitor BP. If BP persistently elevated greater than 130/80 consider addition of anti-hypertensive medication.   5. Type 2 diabetes: A1c on 01/03/2022 was 8.2.  Following with endocrinology.  6. Disposition: Follow-up with EP.  Follow-up in 3 months with general cardiology.  Lenna Sciara, NP 02/12/2022, 4:42 PM

## 2022-02-13 ENCOUNTER — Other Ambulatory Visit: Payer: Self-pay | Admitting: *Deleted

## 2022-02-13 ENCOUNTER — Telehealth: Payer: Self-pay | Admitting: *Deleted

## 2022-02-13 DIAGNOSIS — R9431 Abnormal electrocardiogram [ECG] [EKG]: Secondary | ICD-10-CM

## 2022-02-13 DIAGNOSIS — R55 Syncope and collapse: Secondary | ICD-10-CM

## 2022-02-13 NOTE — Telephone Encounter (Signed)
Called to see if patient is going to mail back his 3rd ZIO monitor he wore from 02/03/22-02/07/22. No answer.

## 2022-02-13 NOTE — Progress Notes (Unsigned)
ZIO AT [id number] applied to patient 01/05/2022 at hospital.  Monitor reached maximum threshold for autotriggered asymptomatic event transmissions.  Irhythm mail replacement monitor ZIO AT monitor serial # [id number].  Patient wore long term monitor-Live Telemetry from 01/05/22-02/02/22.

## 2022-02-16 NOTE — Discharge Summary (Signed)
Physician Discharge Summary   Patient: Andre Stout MRN: [id number] DOB: 1940-05-05  Admit date:     01/02/2022  Discharge date: 01/05/2022  Discharge Physician: Karie Kirks   PCP: [name], FNP   Recommendations at discharge:    Follow up with PCP in 7-10 days. Have chemistry including magnesium checked on that visit to be reported to PCP. Follow up with cardiology as directed.  Discharge Diagnoses: Principal Problem:   Seizure ([place]) Active Problems:   Hypomagnesemia   Diabetes ([facility])   Abnormal EKG   Hypokalemia  Resolved Problems:   * No resolved hospital problems. [facility] Course: Patient is a 82 year old male with history of  prostatic cancer, diabetes type 2 who presented with seizure from home.  On presentation he was post ictal and does not remember the event.  He was witnessed by family to have grand mal seizure lasting about a minute and was unresponsive for around 20 minutes after that.  No known history of seizure disorder.  On presentation he was hypoglycemic, hypokalemic, lab work showed mild leukocytosis.  CT head did not show any acute intracranial abnormalities.  Patient was given magnesium, potassium and was started on D10 drip.  Lab work also showed elevated troponin though patient denied any chest pain,EKG showed A-V dissociation suspicious for complete heart block.  Cardiology consulted for possible pacemaker placement. Cardiology has evaluated the patient. His arrhythmia has resolved and was transient. Cardiology felt that this was due to the patient's electrolyte abnormalities. They do not recommend a pacemaker at this point. They recommend that he be discharged with a 14 day Zio patch.    This am potassium is corrected, but magnesium remains low. Will supplement.   On 01/05/2022 the patient was appropriate for discharge to home.  Assessment and Plan:   New onset seizure: No history of seizures in the past.  Brought because he had an event of  grand mal seizure and postictal episode. Blood sugar was low on presentation, so hypoglycemia induced seizure suspected.  Currently he is completely alert and oriented.  Seizure precaution. CT head did not show any acute intracranial abnormalities. Case was discussed with neurology in the emergency department.  No further work-up or AED recommended for single episode of seizure induced by hypoglycemia   Hypokalemia/hypomagnesemia: Being supplemented and monitored. Magnesium remains low this am. Supplementing and will recheck in the am.   Abnormal EKG/R/O 3rd degreee AV block/elevated troponin: Cardio consulted and following.  EKG was suspicious for A-V dissociation.  Likely secondary to electrolyte abnormalities.  Monitor on telemetry.  Currently asymptomatic.  Heart rate in the range of 70s.  Echo showed EF of 55 to 75%, grade 1 diastolic dysfunction.  Denies any chest pain.  Cardiology consulted for possible pacemaker placement. Cardiology has evaluated the patient. His arrhythmia has resolved and was transient. Cardiology felt that this was due to the patient's electrolyte abnormalities. They do not recommend a pacemaker at this point. They recommend that he be discharged with a 14 day Zio patch.    Diabetes type 2: Takes insulin at home.  Hypoglycemic on arrival.  Started on  D10 .A1c of 8.2.We  will consult diabetic coordinator. D5 was discontinued as patient became hyperglycemic. He was restarted on long acting insulin at a lower dose than his home dosage.   Hypertension: Patient is normotensive on prn hydralazine. Will make hydralazine scheduled.  Does not take any medication at home.  Continue as needed medications for now.  Leukocytosis: Mild.  Most likely reactive.  Continue to monitor.   Deconditioning: We will request PT/OT evaluation after definitive plan for pacemaker.  Lives with family.  Ambulates with the help of cane.   Obesity: BMI 31.84. Noted. Sensible weight loss through  increased activity and dietary modification is encouraged. Complicates all cares.   I have seen and examined this patient myself. I have spent 32 minutes in his evaluation and care.   DVT prophylaxis:Lovenox Code Status: Full Family Communication: None available.    Patient status:Inpatient   BMI 31.84  Consultants: Cardiology   EP Cardiology Procedures performed: None  Disposition: Home Diet recommendation:  Discharge Diet Orders (From admission, onward)     Start     Ordered   01/05/22 0000  Diet - low sodium heart healthy        01/05/22 1555           Cardiac diet  DISCHARGE MEDICATION: Allergies as of 01/05/2022   No Known Allergies      Medication List     TAKE these medications    aspirin 81 MG chewable tablet Chew 1 tablet (81 mg total) by mouth daily.   BD Insulin Syringe U/F 31G X 5/16" 1 ML Misc Generic drug: Insulin Syringe-Needle U-100 USE 2 DAILY   brimonidine 0.2 % ophthalmic solution Commonly known as: ALPHAGAN Place 1 drop into both eyes in the morning and at bedtime.   CENTRUM ADULTS PO Take 1 tablet by mouth daily.   Combigan 0.2-0.5 % ophthalmic solution Generic drug: brimonidine-timolol Place 1 drop into both eyes 2 (two) times daily.   HumuLIN 70/30 (70-30) 100 UNIT/ML injection Generic drug: insulin NPH-regular Human Inject 36 Units into the skin 2 (two) times daily with a meal. With breakfast and with supper. What changed: additional instructions   latanoprost 0.005 % ophthalmic solution Commonly known as: XALATAN Place 1 drop into both eyes at bedtime.   Omega 3 1200 MG Caps Take 1,200 mg by mouth daily.   OneTouch Delica Lancets 59R Misc Use to check blood sugar 4 times per day. Dx code: E11.9   OneTouch Ultra test strip Generic drug: glucose blood USE TO MONITOR GLUCOSE LEVELS 4 TIMES PER DAY E11.9   URINOZINC PO Take 1 tablet by mouth daily.   Vitamin D3 50 MCG (2000 UT) Tabs Take 2,000 Units by mouth daily.         Follow-up Information     [facility] up.   Why: Home Health Physical Therapy and Occupational Therapy -agency will call to arrange visits Contact information: [place] [address] [phone]                 Discharge Exam: Danley Danker Weights   01/02/22 1918 01/03/22 1757  Weight: 91.1 kg 86.8 kg   Exam:  Constitutional:  The patient is awake, alert, and oriented x 3. No acute distress. Respiratory:  No increased work of breathing. No wheezes, rales, or rhonchi No tactile fremitus Cardiovascular:  Regular rate and rhythm No murmurs, ectopy, or gallups. No lateral PMI. No thrills. Abdomen:  Abdomen is soft, non-tender, non-distended No hernias, masses, or organomegaly Normoactive bowel sounds.  Musculoskeletal:  No cyanosis, clubbing, or edema Skin:  No rashes, lesions, ulcers palpation of skin: no induration or nodules Neurologic:  CN 2-12 intact Sensation all 4 extremities intact Psychiatric:  Mental status Mood, affect appropriate Orientation to person, place, time  judgment and insight appear intact  Condition at discharge: fair  The results of significant diagnostics from this hospitalization (including imaging, microbiology, ancillary and laboratory) are listed below for reference.   Imaging Studies: XR Shoulder Left  Result Date: 02/02/2022 Moderate degenerative changes to the [facility] joint.   Microbiology: Results for orders placed or performed during the hospital encounter of 01/02/22  Resp Panel by RT-PCR (Flu A&B, Covid) Nasopharyngeal Swab     Status: None   Collection Time: 01/03/22 12:59 AM   Specimen: Nasopharyngeal Swab; Nasopharyngeal(NP) swabs in vial transport medium  Result Value Ref Range Status   SARS Coronavirus 2 by RT PCR NEGATIVE NEGATIVE Final    Comment: (NOTE) SARS-CoV-2 target nucleic acids are NOT DETECTED.  The SARS-CoV-2 RNA is generally detectable in upper  respiratory specimens during the acute phase of infection. The lowest concentration of SARS-CoV-2 viral copies this assay can detect is 138 copies/mL. A negative result does not preclude SARS-Cov-2 infection and should not be used as the sole basis for treatment or other patient management decisions. A negative result may occur with  improper specimen collection/handling, submission of specimen other than nasopharyngeal swab, presence of viral mutation(s) within the areas targeted by this assay, and inadequate number of viral copies(<138 copies/mL). A negative result must be combined with clinical observations, patient history, and epidemiological information. The expected result is Negative.  Fact Sheet for Patients:  EntrepreneurPulse.com.au  Fact Sheet for Healthcare Providers:  IncredibleEmployment.be  This test is no t yet approved or cleared by the [place] FDA and  has been authorized for detection and/or diagnosis of SARS-CoV-2 by FDA under an Emergency Use Authorization (EUA). This EUA will remain  in effect (meaning this test can be used) for the duration of the COVID-19 declaration under Section 564(b)(1) of the Act, 21 U.S.C.section 360bbb-3(b)(1), unless the authorization is terminated  or revoked sooner.       Influenza A by PCR NEGATIVE NEGATIVE Final   Influenza B by PCR NEGATIVE NEGATIVE Final    Comment: (NOTE) The Xpert Xpress SARS-CoV-2/FLU/RSV plus assay is intended as an aid in the diagnosis of influenza from Nasopharyngeal swab specimens and should not be used as a sole basis for treatment. Nasal washings and aspirates are unacceptable for Xpert Xpress SARS-CoV-2/FLU/RSV testing.  Fact Sheet for Patients: EntrepreneurPulse.com.au  Fact Sheet for Healthcare Providers: IncredibleEmployment.be  This test is not yet approved or cleared by the [place] FDA and has been  authorized for detection and/or diagnosis of SARS-CoV-2 by FDA under an Emergency Use Authorization (EUA). This EUA will remain in effect (meaning this test can be used) for the duration of the COVID-19 declaration under Section 564(b)(1) of the Act, 21 U.S.C. section 360bbb-3(b)(1), unless the authorization is terminated or revoked.  Performed at [facility], [place] [address], [place], [place] [address]   MRSA Next Gen by PCR, Nasal     Status: None   Collection Time: 01/03/22  5:48 PM   Specimen: Nasal Mucosa; Nasal Swab  Result Value Ref Range Status   MRSA by PCR Next Gen NOT DETECTED NOT DETECTED Final    Comment: (NOTE) The GeneXpert MRSA Assay (FDA approved for NASAL specimens only), is one component of a comprehensive MRSA colonization surveillance program. It is not intended to diagnose MRSA infection nor to guide or monitor treatment for MRSA infections. Test performance is not FDA approved in patients less than 2 years old. Performed at [facility], [place] [address], [place], [place] [address]  Labs: CBC: No results for input(s): WBC, NEUTROABS, HGB, HCT, MCV, PLT in the last 168 hours. Basic Metabolic Panel: No results for input(s): NA, K, CL, CO2, GLUCOSE, BUN, CREATININE, CALCIUM, MG, PHOS in the last 168 hours. Liver Function Tests: No results for input(s): AST, ALT, ALKPHOS, BILITOT, PROT, ALBUMIN in the last 168 hours. CBG: No results for input(s): GLUCAP in the last 168 hours.  Discharge time spent: greater than 30 minutes.  Signed: Danaiya Steadman, DO [facility] 02/16/2022

## 2022-02-20 ENCOUNTER — Telehealth: Payer: Self-pay | Admitting: Family

## 2022-02-20 ENCOUNTER — Other Ambulatory Visit: Payer: Self-pay | Admitting: Family

## 2022-02-20 DIAGNOSIS — E876 Hypokalemia: Secondary | ICD-10-CM

## 2022-02-20 NOTE — Telephone Encounter (Signed)
lw 

## 2022-02-22 ENCOUNTER — Ambulatory Visit: Payer: Medicare Other | Admitting: Endocrinology

## 2022-02-26 ENCOUNTER — Other Ambulatory Visit (INDEPENDENT_AMBULATORY_CARE_PROVIDER_SITE_OTHER): Payer: Medicare Other

## 2022-02-26 ENCOUNTER — Ambulatory Visit (INDEPENDENT_AMBULATORY_CARE_PROVIDER_SITE_OTHER): Payer: Medicare Other | Admitting: Endocrinology

## 2022-02-26 ENCOUNTER — Other Ambulatory Visit: Payer: Self-pay

## 2022-02-26 ENCOUNTER — Encounter: Payer: Self-pay | Admitting: Endocrinology

## 2022-02-26 VITALS — BP 148/80 | HR 75 | Ht 65.0 in | Wt 201.6 lb

## 2022-02-26 DIAGNOSIS — E1022 Type 1 diabetes mellitus with diabetic chronic kidney disease: Secondary | ICD-10-CM

## 2022-02-26 DIAGNOSIS — E876 Hypokalemia: Secondary | ICD-10-CM

## 2022-02-26 DIAGNOSIS — N183 Chronic kidney disease, stage 3 unspecified: Secondary | ICD-10-CM | POA: Diagnosis not present

## 2022-02-26 DIAGNOSIS — E875 Hyperkalemia: Secondary | ICD-10-CM

## 2022-02-26 LAB — COMPREHENSIVE METABOLIC PANEL
ALT: 9 U/L (ref 0–53)
AST: 11 U/L (ref 0–37)
Albumin: 3.6 g/dL (ref 3.5–5.2)
Alkaline Phosphatase: 79 U/L (ref 39–117)
BUN: 15 mg/dL (ref 6–23)
CO2: 28 mEq/L (ref 19–32)
Calcium: 9.2 mg/dL (ref 8.4–10.5)
Chloride: 98 mEq/L (ref 96–112)
Creatinine, Ser: 1.18 mg/dL (ref 0.40–1.50)
GFR: 57.96 mL/min — ABNORMAL LOW (ref >60.00)
Glucose, Bld: 475 mg/dL — ABNORMAL HIGH (ref 70–99)
Potassium: 5.3 mEq/L — ABNORMAL HIGH (ref 3.5–5.1)
Sodium: 133 mEq/L — ABNORMAL LOW (ref 135–145)
Total Bilirubin: 1 mg/dL (ref 0.2–1.2)
Total Protein: 6.6 g/dL (ref 6.0–8.3)

## 2022-02-26 LAB — MAGNESIUM: Magnesium: 1.5 mg/dL (ref 1.5–2.5)

## 2022-02-26 MED ORDER — HUMULIN 70/30 (70-30) 100 UNIT/ML ~~LOC~~ SUSP: SUBCUTANEOUS | 11 refills | Status: DC

## 2022-02-26 NOTE — Patient Instructions (Addendum)
check your blood sugar twice a day.  vary the time of day when you check, between before the 3 meals, and at bedtime.  also check if you have symptoms of your blood sugar being too high or too low.  please keep a record of the readings and bring it to your next appointment here.  You can write it on any piece of paper.  please call us sooner if your blood sugar goes below 70, or if you have a lot of readings over 200.   Please change the 70/30 insulin to 30 units with breakfast, and 42 units with supper.     On this type of insulin schedule, you should eat meals on a regular schedule (especially lunch).  If a meal is missed or significantly delayed, your blood sugar could go low.   Please come back for a follow-up appointment in 6 weeks.

## 2022-02-26 NOTE — Progress Notes (Signed)
Subjective:    Patient ID: ESVIN HNAT, male    DOB: 05-26-1940, 82 y.o.   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  Therapy: insulin since 2006.  DKA: once, in 2017.  Severe hypoglycemia: 2015 and 2021.   Pancreatitis: never.   SDOH: He is not a pump candidate, due to not recording cbg's; he takes human insulin, due to cost.   Other: emphasizing basal insulin did not help; he did not tolerate pioglitizone (edema); he takes BID insulin, after poor results with multiple daily injections; he was changed to 70/30, due to pattern of cbg's.   Interval history: Pt says he does not miss insulin doses.  pt states he feels better in general. I reviewed continuous glucose monitor data.  Glucose varies from 70-400.  It is in general highest at [place], and lowest at [place].  It decreases throughout the day, and increases overnight.  Past Medical History:  Diagnosis Date   Diabetes mellitus without complication ([place])    Glaucoma    Prostate cancer ([place])    Been 3-4 years ago    Past Surgical History:  Procedure Laterality Date   CATARACT EXTRACTION Bilateral     Social History   Socioeconomic History   Marital status: Widowed    Spouse name: Not on file   Number of children: 1   Years of education: 43   Highest education level: Not on file  Occupational History   Occupation: Retired  Tobacco Use   Smoking status: Former    Packs/day: 0.50    Years: 4.00    Pack years: 2.00    Types: Cigarettes    Quit date: 10/29/1974    Years since quitting: 47.3   Smokeless tobacco: Never  Vaping Use   Vaping Use: Never used  Substance and Sexual Activity   Alcohol use: No   Drug use: No   Sexual activity: Not Currently  Other Topics Concern   Not on file  Social History Narrative   Born and raised in [place], [place]. Currently reside in a private residence by himself. Daughter lives in the area. No live.  Fun: hunt and fish   Denies religious beliefs that would effect health care.    Social Determinants of Health   Financial Resource Strain: Not on file  Food Insecurity: Not on file  Transportation Needs: Not on file  Physical Activity: Not on file  Stress: Not on file  Social Connections: Not on file  Intimate Partner Violence: Not on file    Current Outpatient Medications on File Prior to Visit  Medication Sig Dispense Refill   aspirin 81 MG chewable tablet Chew 1 tablet (81 mg total) by mouth daily. 30 tablet 0   BD INSULIN SYRINGE U/F 31G X 5/16" 1 ML MISC USE 2 DAILY 200 each 4   brimonidine (ALPHAGAN) 0.2 % ophthalmic solution Place 1 drop into both eyes in the morning and at bedtime.     Cholecalciferol (VITAMIN D3) 2000 UNITS TABS Take 2,000 Units by mouth daily.     COMBIGAN 0.2-0.5 % ophthalmic solution Place 1 drop into both eyes 2 (two) times daily.  12   diclofenac (VOLTAREN) 75 MG EC tablet Take 1 tablet (75 mg total) by mouth 2 (two) times daily as needed. 60 tablet 2   latanoprost (XALATAN) 0.005 % ophthalmic solution Place 1 drop into both eyes at bedtime.  Magnesium Oxide 400 MG CAPS Take 1 capsule (400 mg total) by mouth daily. 90 capsule 0   Misc Natural Products (URINOZINC PO) Take 1 tablet by mouth daily.     Multiple Vitamins-Minerals (CENTRUM ADULTS PO) Take 1 tablet by mouth daily.     Omega 3 1200 MG CAPS Take 1,200 mg by mouth daily.     ONETOUCH DELICA LANCETS 78L MISC Use to check blood sugar 4 times per day. Dx code: E11.9 200 each 2   ONETOUCH ULTRA test strip USE TO MONITOR GLUCOSE LEVELS 4 TIMES PER DAY E11.9 100 each 2   No current facility-administered medications on file prior to visit.    No Known Allergies  Family History  Problem Relation Age of Onset   Diabetes Father    Diabetes Paternal Grandfather     BP (!) 148/80 (BP Location: Left Arm, Patient Position: Sitting, Cuff Size: Normal)    Pulse 75    Ht 5\' 5"  (1.651 m)    Wt 201 lb  9.6 oz (91.4 kg)    SpO2 100%    BMI 33.55 kg/m    Review of Systems     Objective:   Physical Exam   Lab Results  Component Value Date   HGBA1C 8.9 (A) 02/07/2022      Assessment & Plan:  Type 1 DM: uncontrolled.  The pattern of his cbg's indicates he needs some adjustment in his therapy  Patient Instructions  check your blood sugar twice a day.  vary the time of day when you check, between before the 3 meals, and at bedtime.  also check if you have symptoms of your blood sugar being too high or too low.  please keep a record of the readings and bring it to your next appointment here.  You can write it on any piece of paper.  please call us sooner if your blood sugar goes below 70, or if you have a lot of readings over 200.   Please change the 70/30 insulin to 30 units with breakfast, and 42 units with supper.     On this type of insulin schedule, you should eat meals on a regular schedule (especially lunch).  If a meal is missed or significantly delayed, your blood sugar could go low.   Please come back for a follow-up appointment in 6 weeks.

## 2022-03-01 ENCOUNTER — Encounter: Payer: Self-pay | Admitting: Endocrinology

## 2022-03-02 DIAGNOSIS — I10 Essential (primary) hypertension: Secondary | ICD-10-CM

## 2022-03-02 DIAGNOSIS — E669 Obesity, unspecified: Secondary | ICD-10-CM

## 2022-03-02 DIAGNOSIS — E1139 Type 2 diabetes mellitus with other diabetic ophthalmic complication: Secondary | ICD-10-CM | POA: Diagnosis not present

## 2022-03-02 DIAGNOSIS — R569 Unspecified convulsions: Secondary | ICD-10-CM | POA: Diagnosis not present

## 2022-03-02 DIAGNOSIS — Z794 Long term (current) use of insulin: Secondary | ICD-10-CM

## 2022-03-02 DIAGNOSIS — Z6832 Body mass index (BMI) 32.0-32.9, adult: Secondary | ICD-10-CM

## 2022-03-02 DIAGNOSIS — Z8549 Personal history of malignant neoplasm of other male genital organs: Secondary | ICD-10-CM

## 2022-03-02 DIAGNOSIS — Z87891 Personal history of nicotine dependence: Secondary | ICD-10-CM

## 2022-03-02 DIAGNOSIS — G4089 Other seizures: Secondary | ICD-10-CM

## 2022-03-02 DIAGNOSIS — H42 Glaucoma in diseases classified elsewhere: Secondary | ICD-10-CM | POA: Diagnosis not present

## 2022-03-05 ENCOUNTER — Other Ambulatory Visit: Payer: Self-pay

## 2022-03-05 ENCOUNTER — Encounter (INDEPENDENT_AMBULATORY_CARE_PROVIDER_SITE_OTHER): Payer: Medicare Other | Admitting: Ophthalmology

## 2022-03-05 DIAGNOSIS — E113593 Type 2 diabetes mellitus with proliferative diabetic retinopathy without macular edema, bilateral: Secondary | ICD-10-CM | POA: Diagnosis not present

## 2022-03-05 DIAGNOSIS — I10 Essential (primary) hypertension: Secondary | ICD-10-CM

## 2022-03-05 DIAGNOSIS — H43813 Vitreous degeneration, bilateral: Secondary | ICD-10-CM

## 2022-03-05 DIAGNOSIS — H2703 Aphakia, bilateral: Secondary | ICD-10-CM

## 2022-03-05 DIAGNOSIS — H35033 Hypertensive retinopathy, bilateral: Secondary | ICD-10-CM | POA: Diagnosis not present

## 2022-03-05 DIAGNOSIS — H26492 Other secondary cataract, left eye: Secondary | ICD-10-CM

## 2022-03-19 ENCOUNTER — Other Ambulatory Visit (INDEPENDENT_AMBULATORY_CARE_PROVIDER_SITE_OTHER): Payer: Medicare Other

## 2022-03-19 DIAGNOSIS — E875 Hyperkalemia: Secondary | ICD-10-CM | POA: Diagnosis not present

## 2022-03-19 LAB — BASIC METABOLIC PANEL
BUN: 17 mg/dL (ref 6–23)
CO2: 27 mEq/L (ref 19–32)
Calcium: 9.1 mg/dL (ref 8.4–10.5)
Chloride: 101 mEq/L (ref 96–112)
Creatinine, Ser: 1.09 mg/dL (ref 0.40–1.50)
GFR: 63.72 mL/min (ref >60.00)
Glucose, Bld: 234 mg/dL — ABNORMAL HIGH (ref 70–99)
Potassium: 3.7 mEq/L (ref 3.5–5.1)
Sodium: 137 mEq/L (ref 135–145)

## 2022-03-23 ENCOUNTER — Telehealth: Payer: Self-pay

## 2022-03-23 NOTE — Telephone Encounter (Signed)
Andre Stout from [facility] home health care ,  ? ?Patient missed appointment due to stomach bug , appointment rescheduled for next week ?

## 2022-03-26 ENCOUNTER — Ambulatory Visit (INDEPENDENT_AMBULATORY_CARE_PROVIDER_SITE_OTHER): Payer: Medicare Other

## 2022-03-26 ENCOUNTER — Ambulatory Visit: Payer: Medicare Other | Admitting: Podiatry

## 2022-03-26 VITALS — Ht 65.0 in | Wt 201.0 lb

## 2022-03-26 DIAGNOSIS — Z0000 Encounter for general adult medical examination without abnormal findings: Secondary | ICD-10-CM | POA: Diagnosis not present

## 2022-03-26 NOTE — Patient Instructions (Signed)
Mr. Kohlmeyer , ?Thank you for taking time to complete your Medicare Wellness Visit. I appreciate your ongoing commitment to your health goals. Please review the following plan we discussed and let me know if I can assist you in the future.  ? ?Screening recommendations/referrals: ?Colonoscopy: No longer required ?Recommended yearly ophthalmology/optometry visit for glaucoma screening and checkup ?Recommended yearly dental visit for hygiene and checkup ? ?Vaccinations: ?Influenza vaccine: Up to date ?Pneumococcal vaccine: Up to date ?Tdap vaccine: Up to date ?Shingles vaccine: Due-May obtain vaccine at your local pharmacy. ?Covid-19: Up to date ? ?Advanced directives: May obtain forms at our office. ? ?Conditions/risks identified: See problem list ? ?Next appointment: Follow up in one year for your annual wellness visit.  ? ?Preventive Care 82 Years and Older, Male ?Preventive care refers to lifestyle choices and visits with your health care provider that can promote health and wellness. ?What does preventive care include? ?A yearly physical exam. This is also called an annual well check. ?Dental exams once or twice a year. ?Routine eye exams. Ask your health care provider how often you should have your eyes checked. ?Personal lifestyle choices, including: ?Daily care of your teeth and gums. ?Regular physical activity. ?Eating a healthy diet. ?Avoiding tobacco and drug use. ?Limiting alcohol use. ?Practicing safe sex. ?Taking low doses of aspirin every day. ?Taking vitamin and mineral supplements as recommended by your health care provider. ?What happens during an annual well check? ?The services and screenings done by your health care provider during your annual well check will depend on your age, overall health, lifestyle risk factors, and family history of disease. ?Counseling  ?Your health care provider may ask you questions about your: ?Alcohol use. ?Tobacco use. ?Drug use. ?Emotional well-being. ?Home and  relationship well-being. ?Sexual activity. ?Eating habits. ?History of falls. ?Memory and ability to understand (cognition). ?Work and work Statistician. ?Screening  ?You may have the following tests or measurements: ?Height, weight, and BMI. ?Blood pressure. ?Lipid and cholesterol levels. These may be checked every 5 years, or more frequently if you are over 65 years old. ?Skin check. ?Lung cancer screening. You may have this screening every year starting at age 27 if you have a 30-pack-year history of smoking and currently smoke or have quit within the past 15 years. ?Fecal occult blood test (FOBT) of the stool. You may have this test every year starting at age 55. ?Flexible sigmoidoscopy or colonoscopy. You may have a sigmoidoscopy every 5 years or a colonoscopy every 10 years starting at age 23. ?Prostate cancer screening. Recommendations will vary depending on your family history and other risks. ?Hepatitis C blood test. ?Hepatitis B blood test. ?Sexually transmitted disease (STD) testing. ?Diabetes screening. This is done by checking your blood sugar (glucose) after you have not eaten for a while (fasting). You may have this done every 1-3 years. ?Abdominal aortic aneurysm (AAA) screening. You may need this if you are a current or former smoker. ?Osteoporosis. You may be screened starting at age 51 if you are at high risk. ?Talk with your health care provider about your test results, treatment options, and if necessary, the need for more tests. ?Vaccines  ?Your health care provider may recommend certain vaccines, such as: ?Influenza vaccine. This is recommended every year. ?Tetanus, diphtheria, and acellular pertussis (Tdap, Td) vaccine. You may need a Td booster every 10 years. ?Zoster vaccine. You may need this after age 63. ?Pneumococcal 13-valent conjugate (PCV13) vaccine. One dose is recommended after age 49. ?Pneumococcal polysaccharide (PPSV23)  vaccine. One dose is recommended after age 73. ?Talk to your  health care provider about which screenings and vaccines you need and how often you need them. ?This information is not intended to replace advice given to you by your health care provider. Make sure you discuss any questions you have with your health care provider. ?Document Released: 01/13/2016 Document Revised: 09/05/2016 Document Reviewed: 10/18/2015 ?Elsevier Interactive Patient Education ? 2017 [place]. ? ?Fall Prevention in the Home ?Falls can cause injuries. They can happen to people of all ages. There are many things you can do to make your home safe and to help prevent falls. ?What can I do on the outside of my home? ?Regularly fix the edges of walkways and driveways and fix any cracks. ?Remove anything that might make you trip as you walk through a door, such as a raised step or threshold. ?Trim any bushes or trees on the path to your home. ?Use bright outdoor lighting. ?Clear any walking paths of anything that might make someone trip, such as rocks or tools. ?Regularly check to see if handrails are loose or broken. Make sure that both sides of any steps have handrails. ?Any raised decks and porches should have guardrails on the edges. ?Have any leaves, snow, or ice cleared regularly. ?Use sand or salt on walking paths during winter. ?Clean up any spills in your garage right away. This includes oil or grease spills. ?What can I do in the bathroom? ?Use night lights. ?Install grab bars by the toilet and in the tub and shower. Do not use towel bars as grab bars. ?Use non-skid mats or decals in the tub or shower. ?If you need to sit down in the shower, use a plastic, non-slip stool. ?Keep the floor dry. Clean up any water that spills on the floor as soon as it happens. ?Remove soap buildup in the tub or shower regularly. ?Attach bath mats securely with double-sided non-slip rug tape. ?Do not have throw rugs and other things on the floor that can make you trip. ?What can I do in the bedroom? ?Use night  lights. ?Make sure that you have a light by your bed that is easy to reach. ?Do not use any sheets or blankets that are too big for your bed. They should not hang down onto the floor. ?Have a firm chair that has side arms. You can use this for support while you get dressed. ?Do not have throw rugs and other things on the floor that can make you trip. ?What can I do in the kitchen? ?Clean up any spills right away. ?Avoid walking on wet floors. ?Keep items that you use a lot in easy-to-reach places. ?If you need to reach something above you, use a strong step stool that has a grab bar. ?Keep electrical cords out of the way. ?Do not use floor polish or wax that makes floors slippery. If you must use wax, use non-skid floor wax. ?Do not have throw rugs and other things on the floor that can make you trip. ?What can I do with my stairs? ?Do not leave any items on the stairs. ?Make sure that there are handrails on both sides of the stairs and use them. Fix handrails that are broken or loose. Make sure that handrails are as long as the stairways. ?Check any carpeting to make sure that it is firmly attached to the stairs. Fix any carpet that is loose or worn. ?Avoid having throw rugs at the top or bottom  of the stairs. If you do have throw rugs, attach them to the floor with carpet tape. ?Make sure that you have a light switch at the top of the stairs and the bottom of the stairs. If you do not have them, ask someone to add them for you. ?What else can I do to help prevent falls? ?Wear shoes that: ?Do not have high heels. ?Have rubber bottoms. ?Are comfortable and fit you well. ?Are closed at the toe. Do not wear sandals. ?If you use a stepladder: ?Make sure that it is fully opened. Do not climb a closed stepladder. ?Make sure that both sides of the stepladder are locked into place. ?Ask someone to hold it for you, if possible. ?Clearly mark and make sure that you can see: ?Any grab bars or handrails. ?First and last  steps. ?Where the edge of each step is. ?Use tools that help you move around (mobility aids) if they are needed. These include: ?Canes. ?Walkers. ?Scooters. ?Crutches. ?Turn on the lights when you go into a dark a

## 2022-03-26 NOTE — Progress Notes (Signed)
? ?Subjective:  ? Andre Stout is a 82 y.o. male who presents for Medicare Annual/Subsequent preventive examination. ? ?I connected with Woody today by telephone and verified that I am speaking with the correct person using two identifiers. ?Location patient: home ?Location provider: work ?Persons participating in the virtual visit: patient, nurse.  ?  ?I discussed the limitations, risks, security and privacy concerns of performing an evaluation and management service by telephone and the availability of in person appointments. I also discussed with the patient that there may be a patient responsible charge related to this service. The patient expressed understanding and verbally consented to this telephonic visit.  ?  ?Interactive audio and video telecommunications were attempted between this provider and patient, however failed, due to patient having technical difficulties OR patient did not have access to video capability.  We continued and completed visit with audio only. ? ?Some vital signs may be absent or patient reported.  ? ?Time Spent with patient on telephone encounter: 20 minutes ? ? ?Review of Systems    ? ?Cardiac Risk Factors include: advanced age (>30mn, >>21women);male gender;diabetes mellitus;hypertension;obesity (BMI >30kg/m2) ? ?   ?Objective:  ?  ?Today's Vitals  ? 03/26/22 1538  ?Weight: 201 lb (91.2 kg)  ?Height: '5\' 5"'$  (1.651 m)  ? ?Body mass index is 33.45 kg/m?. ? ? ?  03/26/2022  ?  3:41 PM 01/02/2022  ?  8:05 PM 01/02/2022  ?  7:19 PM 09/09/2021  ? 10:29 AM 10/09/2020  ? 10:00 PM 10/09/2020  ?  1:26 PM 05/28/2019  ?  1:28 PM  ?Advanced Directives  ?Does Patient Have a Medical Advance Directive? No No Unable to assess, patient is non-responsive or altered mental status No  No No  ?Would patient like information on creating a medical advance directive?  No - Patient declined  No - Patient declined No - Patient declined  Yes (ED - Information included in AVS)  ? ? ?Current Medications  (verified) ?Outpatient Encounter Medications as of 03/26/2022  ?Medication Sig  ? aspirin 81 MG chewable tablet Chew 1 tablet (81 mg total) by mouth daily.  ? BD INSULIN SYRINGE U/F 31G X 5/16" 1 ML MISC USE 2 DAILY  ? brimonidine (ALPHAGAN) 0.2 % ophthalmic solution Place 1 drop into both eyes in the morning and at bedtime.  ? Cholecalciferol (VITAMIN D3) 2000 UNITS TABS Take 2,000 Units by mouth daily.  ? COMBIGAN 0.2-0.5 % ophthalmic solution Place 1 drop into both eyes 2 (two) times daily.  ? diclofenac (VOLTAREN) 75 MG EC tablet Take 1 tablet (75 mg total) by mouth 2 (two) times daily as needed.  ? insulin NPH-regular Human (HUMULIN 70/30) (70-30) 100 UNIT/ML injection 30 units with breakfast and 42 units with supper.  ? latanoprost (XALATAN) 0.005 % ophthalmic solution Place 1 drop into both eyes at bedtime.  ? Magnesium Oxide 400 MG CAPS Take 1 capsule (400 mg total) by mouth daily.  ? Misc Natural Products (URINOZINC PO) Take 1 tablet by mouth daily.  ? Multiple Vitamins-Minerals (CENTRUM ADULTS PO) Take 1 tablet by mouth daily.  ? Omega 3 1200 MG CAPS Take 1,200 mg by mouth daily.  ? ONETOUCH DELICA LANCETS 343PMISC Use to check blood sugar 4 times per day. Dx code: E11.9  ? ONETOUCH ULTRA test strip USE TO MONITOR GLUCOSE LEVELS 4 TIMES PER DAY E11.9  ? ?No facility-administered encounter medications on file as of 03/26/2022.  ? ? ?Allergies (verified) ?Patient has no known allergies.  ? ?  History: ?Past Medical History:  ?Diagnosis Date  ? Diabetes mellitus without complication ([place])   ? Glaucoma   ? Prostate cancer ([place])   ? Been 3-4 years ago  ? ?Past Surgical History:  ?Procedure Laterality Date  ? CATARACT EXTRACTION Bilateral   ? ?Family History  ?Problem Relation Age of Onset  ? Diabetes Father   ? Diabetes Paternal Grandfather   ? ?Social History  ? ?Socioeconomic History  ? Marital status: Widowed  ?  Spouse name: Not on file  ? Number of children: 1  ? Years of education: 25  ? Highest education  level: Not on file  ?Occupational History  ? Occupation: Retired  ?Tobacco Use  ? Smoking status: Former  ?  Packs/day: 0.50  ?  Years: 4.00  ?  Pack years: 2.00  ?  Types: Cigarettes  ?  Quit date: 10/29/1974  ?  Years since quitting: 47.4  ? Smokeless tobacco: Never  ?Vaping Use  ? Vaping Use: Never used  ?Substance and Sexual Activity  ? Alcohol use: No  ? Drug use: No  ? Sexual activity: Not Currently  ?Other Topics Concern  ? Not on file  ?Social History Narrative  ? Born and raised in [place], [place]. Currently reside in a private residence by himself. Daughter lives in the area. No live. Fun: hunt and fish  ? Denies religious beliefs that would effect health care.   ? ?Social Determinants of Health  ? ?Financial Resource Strain: Low Risk   ? Difficulty of Paying Living Expenses: Not hard at all  ?Food Insecurity: No Food Insecurity  ? Worried About Charity fundraiser in the Last Year: Never true  ? Ran Out of Food in the Last Year: Never true  ?Transportation Needs: No Transportation Needs  ? Lack of Transportation (Medical): No  ? Lack of Transportation (Non-Medical): No  ?Physical Activity: Inactive  ? Days of Exercise per Week: 0 days  ? Minutes of Exercise per Session: 0 min  ?Stress: No Stress Concern Present  ? Feeling of Stress : Not at all  ?Social Connections: Not on file  ? ? ?Tobacco Counseling ?Counseling given: Not Answered ? ? ?Clinical Intake: ? ?Pre-visit preparation completed: Yes ? ?Pain : No/denies pain ? ?  ? ?BMI - recorded: 33.45 ?Nutritional Status: BMI > 30  Obese ?Nutritional Risks: None ?Diabetes: Yes ?CBG done?: No ?Did pt. bring in CBG monitor from home?: No (phone visit) ? ?How often do you need to have someone help you when you read instructions, pamphlets, or other written materials from your doctor or pharmacy?: 1 - Never ? ?Diabetes: ? ?Is the patient diabetic?  Yes  ?If diabetic, was a CBG obtained today?  No  ?Did the patient bring in their glucometer from home?  No phone  vsiit ?How often do you monitor your CBG's? 3-4 times per day.  ? ?Financial Strains and Diabetes Management: ? ?Are you having any financial strains with the device, your supplies or your medication? No .  ?Does the patient want to be seen by Chronic Care Management for management of their diabetes?  No  ?Would the patient like to be referred to a Nutritionist or for Diabetic Management?  No  ? ?Diabetic Exams: ? ?Diabetic Eye Exam: . Overdue for diabetic eye exam. Pt has been advised about the importance in completing this exam.  ? ?Diabetic Foot Exam: Completed 09/15/2021.  ? ?Interpreter Needed?: No ? ?Information entered by :: Caroleen Hamman LPN ? ? ?  Activities of Daily Living ? ?  03/26/2022  ?  3:44 PM  ?In your present state of health, do you have any difficulty performing the following activities:  ?Hearing? 0  ?Vision? 0  ?Difficulty concentrating or making decisions? 0  ?Walking or climbing stairs? 0  ?Dressing or bathing? 0  ?Doing errands, shopping? 0  ?Preparing Food and eating ? N  ?Using the Toilet? N  ?In the past six months, have you accidently leaked urine? N  ?Do you have problems with loss of bowel control? N  ?Managing your Medications? N  ?Managing your Finances? N  ?Housekeeping or managing your Housekeeping? N  ? ? ?Patient Care Team: ?[name], FNP as PCP - General (Internal Medicine) ?Geralynn Rile, MD as PCP - Cardiology (Cardiology) ?Renato Shin, MD as Consulting Physician (Endocrinology) ? ?Indicate any recent Medical Services you may have received from other than [facility] providers in the past year (date may be approximate). ? ?   ?Assessment:  ? This is a routine wellness examination for Andre Stout. ? ?Hearing/Vision screen ?Hearing Screening - Comments:: No issues ?Vision Screening - Comments:: Last eye exam-over a year ago ? ?Dietary issues and exercise activities discussed: ?Current Exercise Habits: The patient does not participate in regular exercise at present,  Exercise limited by: None identified ? ? Goals Addressed   ? ?  ?  ?  ?  ? This Visit's Progress  ?  Patient Stated     ?  Increase exercise ?  ? ?  ? ?Depression Screen ? ?  03/26/2022  ?  3:44 PM 01/12/2022  ?  3:06 PM 10

## 2022-03-27 ENCOUNTER — Ambulatory Visit: Payer: Medicare Other | Admitting: Podiatry

## 2022-04-04 ENCOUNTER — Ambulatory Visit: Payer: Medicare Other | Admitting: Podiatry

## 2022-04-16 ENCOUNTER — Ambulatory Visit: Payer: Medicare Other | Admitting: Podiatry

## 2022-04-23 ENCOUNTER — Ambulatory Visit: Payer: Medicare Other | Admitting: Endocrinology

## 2022-05-04 NOTE — Progress Notes (Deleted)
Cardiology Clinic Note   Patient Name: Andre Stout Date of Encounter: 05/04/2022  Primary Care Provider:  [name], [place] Primary Cardiologist:  Evalina Field, MD  Patient Profile    Andre Stout 82 year old male presents to the clinic today for follow-up evaluation of his syncope and first-degree AV block.  Past Medical History    Past Medical History:  Diagnosis Date   Diabetes mellitus without complication ([place])    Glaucoma    Prostate cancer ([place])    Been 3-4 years ago   Past Surgical History:  Procedure Laterality Date   CATARACT EXTRACTION Bilateral     Allergies  No Known Allergies  History of Present Illness    Andre Stout has a PMH of HTN, diabetes, and prostate cancer.  Echocardiogram 01/03/2022 showed normal LV function and mild-moderate hypertrophy of the base of the septum.  He was also noted to have grade 1 diastolic dysfunction and aortic valve sclerosis.  He had a syncopal event and was brought to the emergency department on 01/03/2022.  His EKG showed complete heart block on arrival to the emergency room.  He was also noted to have seizure-like activity.  His head CT was negative.  He was severely hypokalemic.  He was evaluated by EP and an outpatient cardiac event monitor was recommended.  It was felt that his episode of complete heart block was transient related to his metabolic imbalance.  Avoiding AV nodal blocking agents in the future was recommended.  His cardiac event monitor showed a minimum heart beat of 38 bpm and a maximum heart rate of 132 bpm.  He was noted to have 1 run of SVT lasting for 10 beats.  He was also noted to have episodes of second-degree AV block.  No complete heart block was noted.  He was found to have frequent PVCs with 13.3% burden.  He presents to the clinic today for follow-up evaluation and states***  *** denies chest pain, shortness of breath, lower extremity edema, fatigue, palpitations, melena, hematuria,  hemoptysis, diaphoresis, weakness, presyncope, syncope, orthopnea, and PND.     Home Medications    Prior to Admission medications   Medication Sig Start Date End Date Taking? Authorizing Provider  aspirin 81 MG chewable tablet Chew 1 tablet (81 mg total) by mouth daily. 01/06/22   Swayze, Ava, DO  BD INSULIN SYRINGE U/F 31G X 5/16" 1 ML MISC USE 2 DAILY 08/24/21   Renato Shin, MD  brimonidine [facility]) 0.2 % ophthalmic solution Place 1 drop into both eyes in the morning and at bedtime. 12/18/21   [provider]  Cholecalciferol (VITAMIN D3) 2000 UNITS TABS Take 2,000 Units by mouth daily.    [provider]  COMBIGAN 0.2-0.5 % ophthalmic solution Place 1 drop into both eyes 2 (two) times daily. 12/19/15   [provider]  diclofenac (VOLTAREN) 75 MG EC tablet Take 1 tablet (75 mg total) by mouth 2 (two) times daily as needed. 02/01/22   Aundra Dubin, PA-C  insulin NPH-regular Human (HUMULIN 70/30) (70-30) 100 UNIT/ML injection 30 units with breakfast and 42 units with supper. 02/26/22   Renato Shin, MD  latanoprost (XALATAN) 0.005 % ophthalmic solution Place 1 drop into both eyes at bedtime. 01/01/22   [provider]  Magnesium Oxide 400 MG CAPS Take 1 capsule (400 mg total) by mouth daily. 01/22/22   [name], FNP  Misc Natural Products (URINOZINC PO) Take 1 tablet by mouth daily.  [provider]  Multiple Vitamins-Minerals (CENTRUM ADULTS PO) Take 1 tablet by mouth daily.    [provider]  Omega 3 1200 MG CAPS Take 1,200 mg by mouth daily.    [provider]  [facility] DELICA LANCETS 41D MISC Use to check blood sugar 4 times per day. Dx code: E11.9 03/27/16   Renato Shin, MD  [facility] ULTRA test strip USE TO MONITOR GLUCOSE LEVELS 4 TIMES PER DAY E11.9 06/10/19   Renato Shin, MD    Family History    Family History  Problem Relation Age of Onset   Diabetes Father    Diabetes Paternal Grandfather     He indicated that his mother is deceased. He indicated that his father is deceased. He indicated that his maternal grandmother is deceased. He indicated that his maternal grandfather is deceased. He indicated that his paternal grandmother is deceased. He indicated that his paternal grandfather is deceased.  Social History    Social History   Socioeconomic History   Marital status: Widowed    Spouse name: Not on file   Number of children: 1   Years of education: 68   Highest education level: Not on file  Occupational History   Occupation: Retired  Tobacco Use   Smoking status: Former    Packs/day: 0.50    Years: 4.00    Pack years: 2.00    Types: Cigarettes    Quit date: 10/29/1974    Years since quitting: 47.5   Smokeless tobacco: Never  Vaping Use   Vaping Use: Never used  Substance and Sexual Activity   Alcohol use: No   Drug use: No   Sexual activity: Not Currently  Other Topics Concern   Not on file  Social History Narrative   Born and raised in [place], [place]. Currently reside in a private residence by himself. Daughter lives in the area. No live. Fun: hunt and fish   Denies religious beliefs that would effect health care.    Social Determinants of Health   Financial Resource Strain: Low Risk    Difficulty of Paying Living Expenses: Not hard at all  Food Insecurity: No Food Insecurity   Worried About Charity fundraiser in the Last Year: Never true   [place] in the Last Year: Never true  Transportation Needs: No Transportation Needs   Lack of Transportation (Medical): No   Lack of Transportation (Non-Medical): No  Physical Activity: Inactive   Days of Exercise per Week: 0 days   Minutes of Exercise per Session: 0 min  Stress: No Stress Concern Present   Feeling of Stress : Not at all  Social Connections: Not on file  Intimate Partner Violence: Not At Risk   Fear of Current or Ex-Partner: No   Emotionally Abused: No   Physically Abused: No    Sexually Abused: No     Review of Systems    General:  No chills, fever, night sweats or weight changes.  Cardiovascular:  No chest pain, dyspnea on exertion, edema, orthopnea, palpitations, paroxysmal nocturnal dyspnea. Dermatological: No rash, lesions/masses Respiratory: No cough, dyspnea Urologic: No hematuria, dysuria Abdominal:   No nausea, vomiting, diarrhea, bright red blood per rectum, melena, or hematemesis Neurologic:  No visual changes, wkns, changes in mental status. All other systems reviewed and are otherwise negative except as noted above.  Physical Exam    VS:  There were no vitals taken for this visit. , BMI There is no height or weight on  file to calculate BMI. GEN: Well nourished, well developed, in no acute distress. HEENT: normal. Neck: Supple, no JVD, carotid bruits, or masses. Cardiac: RRR, no murmurs, rubs, or gallops. No clubbing, cyanosis, edema.  Radials/DP/PT 2+ and equal bilaterally.  Respiratory:  Respirations regular and unlabored, clear to auscultation bilaterally. GI: Soft, nontender, nondistended, BS + x 4. MS: no deformity or atrophy. Skin: warm and dry, no rash. Neuro:  Strength and sensation are intact. Psych: Normal affect.  Accessory Clinical Findings    Recent Labs: 01/02/2022: TSH 4.218 01/12/2022: Hemoglobin 15.7; Platelets 301 02/26/2022: ALT 9; Magnesium 1.5 03/19/2022: BUN 17; Creatinine, Ser 1.09; Potassium 3.7; Sodium 137   Recent Lipid Panel    Component Value Date/Time   CHOL 185 01/03/2022 1225   TRIG 151 (H) 01/03/2022 1225   HDL 51 01/03/2022 1225   CHOLHDL 3.6 01/03/2022 1225   VLDL 30 01/03/2022 1225   LDLCALC 104 (H) 01/03/2022 1225   LDLDIRECT 121.0 03/25/2019 1008    ECG personally reviewed by me today- *** - No acute changes  Echocardiogram 01/03/2022 IMPRESSIONS     1. Left ventricular ejection fraction, by estimation, is 55 to 60%. The  left ventricle has normal function. Left ventricular endocardial border   not optimally defined to evaluate regional wall motion. Appears grossly  normal. There is mild-to-moderate  hypertrophy of the basal septum. The rest of the LV segments demonstrate  mild left ventricular hypertrophy. Left ventricular diastolic parameters  are consistent with Grade I diastolic dysfunction (impaired relaxation).   2. Right ventricular systolic function is normal. The right ventricular  size is normal.   3. The mitral valve is normal in structure. No evidence of mitral valve  regurgitation. No evidence of mitral stenosis.   4. The aortic valve is tricuspid. There is mild calcification of the  aortic valve. There is mild thickening of the aortic valve. Aortic valve  regurgitation is not visualized. Aortic valve sclerosis/calcification is  present, without any evidence of  aortic stenosis.   5. The inferior vena cava is normal in size with greater than 50%  respiratory variability, suggesting right atrial pressure of 3 mmHg.   Comparison(s): Compared to prior echo report in 09/2020, there is no  significant change.  Cardiac event monitor 02/13/2022 Enrollment 01/22/2022-02/02/2022 (14 days 0 hours). Patient had a min HR of 38 bpm (second degree AV block type 1), max HR of 132 bpm (supraventricular tachycardia), and avg HR of 64 bpm (normal sinus rhythm). Predominant underlying rhythm was Sinus Rhythm. 1 run of Supraventricular Tachycardia occurred lasting 10 beats with a max rate of 132 bpm (avg 122 bpm). Second Degree AV Block-Mobitz I (Wenckebach) was present. Isolated SVEs were rare (<1.0%), and no SVE Couplets or SVE Triplets were present. Isolated VEs were frequent (13.3%, [id number]), VE Couplets were rare (<1.0%, 2005), and VE Triplets were rare (<1.0%, 57). Ventricular Bigeminy and Trigeminy were present.   Impression: 1. No significant arrhythmias.  2. 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 No complete heart block.  3. 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     [place] T. Audie Box, MD, [place]  [address] [place], [place] [address] [phone]  1:58 PM  Assessment & Plan   1.  Syncope, Transient complete heart block-denies further episodes of lightheadedness, presyncope and syncope.  Was admitted to the hospital 01/03/2022 and discharged on 01/05/2022.  On admission to the emergency department he was noted to have complete heart block.  It was felt that his CHB was related to his hypokalemia and hypoglycemia.  Cardiac event monitor showed second-degree AV block, PVCs, and brief run of SVT.  Details above.  Seen and evaluated by EP. Avoid AV nodal blocking agents  Demand ischemia-denies episodes of arm ,neck ,back or chest discomfort.  Was noted to have elevated troponins during his previous admission which were felt to be related to demand ischemia in the setting of seizure.  Echocardiogram showed normal LV function and G1 DD. No plans for ischemic evaluation at this time  Essential hypertension-BP today***.  Well-controlled at home. Heart healthy low-sodium diet-salty 6 given Increase physical activity as tolerated Maintain blood pressure log  Disposition: Follow-up with Dr. Audie Box in 6 months and as needed    Jossie Ng. Tigran Haynie NP-C    05/04/2022, 7:14 AM [address] Office [phone] Fax [phone]  Notice: This dictation was prepared with Dragon dictation along with smaller phrase technology. Any transcriptional errors that result from this process are unintentional and may not be corrected upon review.  I spent***minutes examining this patient, reviewing medications, and using patient centered shared decision making involving her cardiac care.  Prior to her visit I spent greater than 20 minutes reviewing her past medical history,  medications, and prior cardiac tests.

## 2022-05-07 ENCOUNTER — Ambulatory Visit: Payer: Medicare Other | Admitting: General Practice

## 2022-05-11 ENCOUNTER — Encounter: Payer: Self-pay | Admitting: General Practice

## 2022-06-05 ENCOUNTER — Telehealth: Payer: Self-pay | Admitting: Family

## 2022-06-05 NOTE — Telephone Encounter (Signed)
Medication: Magnesium Oxide 400 MG CAPS  Has the patient contacted their pharmacy? Yes  Preferred Pharmacy (with phone number or street name):  [id number] [id number] Phone:  [phone] Fax:  [phone]  Agent: Please be advised that RX refills may take up to 3 business days. We ask that you follow-up with your pharmacy.

## 2022-06-06 MED ORDER — MAGNESIUM OXIDE 400 MG PO CAPS: 400.0000 mg | ORAL_CAPSULE | Freq: Every day | ORAL | 0 refills | Status: DC

## 2022-06-06 NOTE — Telephone Encounter (Signed)
No vm on home number/ left detailed message that rx was sent in.

## 2022-07-31 ENCOUNTER — Inpatient Hospital Stay (HOSPITAL_COMMUNITY)
Admission: EM | Admit: 2022-07-31 | Discharge: 2022-08-04 | DRG: 310 | Disposition: A | Discharge status: Home Health | Payer: Medicare Other | Attending: Family Medicine | Admitting: Family Medicine

## 2022-07-31 ENCOUNTER — Emergency Department (HOSPITAL_COMMUNITY): Payer: Medicare Other

## 2022-07-31 ENCOUNTER — Other Ambulatory Visit: Payer: Self-pay

## 2022-07-31 DIAGNOSIS — I700 Atherosclerosis of aorta: Secondary | ICD-10-CM | POA: Diagnosis present

## 2022-07-31 DIAGNOSIS — I4589 Other specified conduction disorders: Secondary | ICD-10-CM | POA: Diagnosis present

## 2022-07-31 DIAGNOSIS — Z9841 Cataract extraction status, right eye: Secondary | ICD-10-CM

## 2022-07-31 DIAGNOSIS — C61 Malignant neoplasm of prostate: Secondary | ICD-10-CM | POA: Diagnosis present

## 2022-07-31 DIAGNOSIS — R55 Syncope and collapse: Secondary | ICD-10-CM | POA: Diagnosis present

## 2022-07-31 DIAGNOSIS — E11649 Type 2 diabetes mellitus with hypoglycemia without coma: Secondary | ICD-10-CM | POA: Diagnosis not present

## 2022-07-31 DIAGNOSIS — E876 Hypokalemia: Secondary | ICD-10-CM | POA: Diagnosis present

## 2022-07-31 DIAGNOSIS — Z8546 Personal history of malignant neoplasm of prostate: Secondary | ICD-10-CM

## 2022-07-31 DIAGNOSIS — Z7982 Long term (current) use of aspirin: Secondary | ICD-10-CM

## 2022-07-31 DIAGNOSIS — R600 Localized edema: Secondary | ICD-10-CM | POA: Diagnosis present

## 2022-07-31 DIAGNOSIS — Z9842 Cataract extraction status, left eye: Secondary | ICD-10-CM

## 2022-07-31 DIAGNOSIS — E119 Type 2 diabetes mellitus without complications: Secondary | ICD-10-CM

## 2022-07-31 DIAGNOSIS — D329 Benign neoplasm of meninges, unspecified: Secondary | ICD-10-CM | POA: Diagnosis present

## 2022-07-31 DIAGNOSIS — R569 Unspecified convulsions: Secondary | ICD-10-CM

## 2022-07-31 DIAGNOSIS — R790 Abnormal level of blood mineral: Secondary | ICD-10-CM | POA: Diagnosis present

## 2022-07-31 DIAGNOSIS — Z794 Long term (current) use of insulin: Secondary | ICD-10-CM

## 2022-07-31 DIAGNOSIS — R4781 Slurred speech: Secondary | ICD-10-CM | POA: Diagnosis present

## 2022-07-31 DIAGNOSIS — I442 Atrioventricular block, complete: Secondary | ICD-10-CM | POA: Diagnosis not present

## 2022-07-31 DIAGNOSIS — Z833 Family history of diabetes mellitus: Secondary | ICD-10-CM

## 2022-07-31 DIAGNOSIS — Z87891 Personal history of nicotine dependence: Secondary | ICD-10-CM

## 2022-07-31 DIAGNOSIS — E8770 Fluid overload, unspecified: Secondary | ICD-10-CM | POA: Diagnosis present

## 2022-07-31 DIAGNOSIS — R197 Diarrhea, unspecified: Secondary | ICD-10-CM | POA: Diagnosis present

## 2022-07-31 DIAGNOSIS — H409 Unspecified glaucoma: Secondary | ICD-10-CM | POA: Diagnosis present

## 2022-07-31 DIAGNOSIS — I10 Essential (primary) hypertension: Secondary | ICD-10-CM | POA: Diagnosis present

## 2022-07-31 DIAGNOSIS — Z79899 Other long term (current) drug therapy: Secondary | ICD-10-CM

## 2022-07-31 DIAGNOSIS — E1165 Type 2 diabetes mellitus with hyperglycemia: Secondary | ICD-10-CM | POA: Diagnosis not present

## 2022-07-31 LAB — CBC WITH DIFFERENTIAL/PLATELET
Abs Immature Granulocytes: 0.05 10*3/uL (ref 0.00–0.07)
Basophils Absolute: 0.1 10*3/uL (ref 0.0–0.1)
Basophils Relative: 1 %
Eosinophils Absolute: 0.4 10*3/uL (ref 0.0–0.5)
Eosinophils Relative: 5 %
HCT: 46 % (ref 39.0–52.0)
Hemoglobin: 14.7 g/dL (ref 13.0–17.0)
Immature Granulocytes: 1 %
Lymphocytes Relative: 32 %
Lymphs Abs: 2.8 10*3/uL (ref 0.7–4.0)
MCH: 27.2 pg (ref 26.0–34.0)
MCHC: 32 g/dL (ref 30.0–36.0)
MCV: 85.2 fL (ref 80.0–100.0)
Monocytes Absolute: 0.8 10*3/uL (ref 0.1–1.0)
Monocytes Relative: 9 %
Neutro Abs: 4.6 10*3/uL (ref 1.7–7.7)
Neutrophils Relative %: 52 %
Platelets: 340 10*3/uL (ref 150–400)
RBC: 5.4 MIL/uL (ref 4.22–5.81)
RDW: 14.1 % (ref 11.5–15.5)
WBC: 8.7 10*3/uL (ref 4.0–10.5)
nRBC: 0 % (ref 0.0–0.2)

## 2022-07-31 LAB — COMPREHENSIVE METABOLIC PANEL
ALT: 12 U/L (ref 0–44)
AST: 11 U/L — ABNORMAL LOW (ref 15–41)
Albumin: 2.9 g/dL — ABNORMAL LOW (ref 3.5–5.0)
Alkaline Phosphatase: 77 U/L (ref 38–126)
Anion gap: 8 (ref 5–15)
BUN: 6 mg/dL — ABNORMAL LOW (ref 8–23)
CO2: 26 mmol/L (ref 22–32)
Calcium: 8.6 mg/dL — ABNORMAL LOW (ref 8.9–10.3)
Chloride: 102 mmol/L (ref 98–111)
Creatinine, Ser: 1.15 mg/dL (ref 0.61–1.24)
GFR, Estimated: >60 mL/min (ref >60)
Glucose, Bld: 144 mg/dL — ABNORMAL HIGH (ref 70–99)
Potassium: 2.7 mmol/L — CL (ref 3.5–5.1)
Sodium: 136 mmol/L (ref 135–145)
Total Bilirubin: 1.3 mg/dL — ABNORMAL HIGH (ref 0.3–1.2)
Total Protein: 6.5 g/dL (ref 6.5–8.1)

## 2022-07-31 LAB — MAGNESIUM: Magnesium: 1.5 mg/dL — ABNORMAL LOW (ref 1.7–2.4)

## 2022-07-31 LAB — BRAIN NATRIURETIC PEPTIDE: B Natriuretic Peptide: 151.7 pg/mL — ABNORMAL HIGH (ref 0.0–100.0)

## 2022-07-31 LAB — TROPONIN I (HIGH SENSITIVITY): Troponin I (High Sensitivity): 25 ng/L — ABNORMAL HIGH (ref <18)

## 2022-07-31 LAB — CBG MONITORING, ED: Glucose-Capillary: 105 mg/dL — ABNORMAL HIGH (ref 70–99)

## 2022-07-31 MED ORDER — POTASSIUM CHLORIDE 10 MEQ/100ML IV SOLN
10.0000 meq | INTRAVENOUS | Status: AC
  Administered 2022-07-31 (×2): 10 meq via INTRAVENOUS
  Filled 2022-07-31 (×2): qty 100

## 2022-07-31 MED ORDER — POTASSIUM CHLORIDE CRYS ER 20 MEQ PO TBCR
40.0000 meq | EXTENDED_RELEASE_TABLET | Freq: Once | ORAL | Status: DC
  Filled 2022-07-31: qty 2

## 2022-07-31 MED ORDER — MAGNESIUM SULFATE 2 GM/50ML IV SOLN
2.0000 g | Freq: Once | INTRAVENOUS | Status: AC
  Administered 2022-07-31: 2 g via INTRAVENOUS
  Filled 2022-07-31: qty 50

## 2022-07-31 MED ORDER — ONDANSETRON HCL 4 MG/2ML IJ SOLN
4.0000 mg | Freq: Once | INTRAMUSCULAR | Status: AC
  Administered 2022-07-31: 4 mg via INTRAVENOUS
  Filled 2022-07-31: qty 2

## 2022-07-31 MED ORDER — ASPIRIN 81 MG PO CHEW
81.0000 mg | CHEWABLE_TABLET | Freq: Every day | ORAL | Status: DC
  Administered 2022-08-01 – 2022-08-04 (×4): 81 mg via ORAL
  Filled 2022-07-31 (×4): qty 1

## 2022-07-31 NOTE — ED Provider Notes (Signed)
[facility] EMERGENCY DEPARTMENT Provider Note   CSN: [id number] Arrival date & time: 07/31/22  2008     History  Chief Complaint  Patient presents with   Seizures    TYKWON FERA is a 82 y.o. male history of complete heart block, diabetes, here presenting with seizure versus syncope.  Patient apparently was walking to the bathroom and appeared to be altered and there was a questionable seizure and he urinated on himself.  Had similar episode previously and was thought to be secondary to hypomagnesemia and hypokalemia.   The history is provided by the patient.       Home Medications Prior to Admission medications   Medication Sig Start Date End Date Taking? Authorizing Provider  aspirin 81 MG chewable tablet Chew 1 tablet (81 mg total) by mouth daily. 01/06/22   Swayze, Ava, DO  BD INSULIN SYRINGE U/F 31G X 5/16" 1 ML MISC USE 2 DAILY 08/24/21   Renato Shin, MD  brimonidine [facility]) 0.2 % ophthalmic solution Place 1 drop into both eyes in the morning and at bedtime. 12/18/21   [provider]  Cholecalciferol (VITAMIN D3) 2000 UNITS TABS Take 2,000 Units by mouth daily.    [provider]  COMBIGAN 0.2-0.5 % ophthalmic solution Place 1 drop into both eyes 2 (two) times daily. 12/19/15   [provider]  diclofenac (VOLTAREN) 75 MG EC tablet Take 1 tablet (75 mg total) by mouth 2 (two) times daily as needed. 02/01/22   Aundra Dubin, PA-C  insulin NPH-regular Human (HUMULIN 70/30) (70-30) 100 UNIT/ML injection 30 units with breakfast and 42 units with supper. 02/26/22   Renato Shin, MD  latanoprost (XALATAN) 0.005 % ophthalmic solution Place 1 drop into both eyes at bedtime. 01/01/22   [provider]  Magnesium Oxide 400 MG CAPS Take 1 capsule (400 mg total) by mouth daily. 06/06/22   [name], FNP  Misc Natural Products (URINOZINC PO) Take 1 tablet by mouth daily.    [provider]  Multiple  Vitamins-Minerals (CENTRUM ADULTS PO) Take 1 tablet by mouth daily.    [provider]  Omega 3 1200 MG CAPS Take 1,200 mg by mouth daily.    [provider]  [facility] DELICA LANCETS 35T MISC Use to check blood sugar 4 times per day. Dx code: E11.9 03/27/16   Renato Shin, MD  [facility] ULTRA test strip USE TO MONITOR GLUCOSE LEVELS 4 TIMES PER DAY E11.9 06/10/19   Renato Shin, MD      Allergies    Patient has no known allergies.    Review of Systems   Review of Systems  Neurological:  Positive for seizures.  All other systems reviewed and are negative.   Physical Exam Updated Vital Signs BP (!) 175/74   Pulse (!) 49   Temp 98.5 F (36.9 C) (Oral)   Resp 15   Ht '5\' 5"'$  (1.651 m)   Wt 86.2 kg   SpO2 98%   BMI 31.62 kg/m  Physical Exam Vitals and nursing note reviewed.  Constitutional:      Appearance: Normal appearance.  HENT:     Head: Normocephalic.     Nose: Nose normal.     Mouth/Throat:     Mouth: Mucous membranes are dry.  Eyes:     Extraocular Movements: Extraocular movements intact.     Pupils: Pupils are equal, round, and reactive to light.  Cardiovascular:     Rate and Rhythm: Normal rate  and regular rhythm.     Pulses: Normal pulses.     Heart sounds: Normal heart sounds.  Pulmonary:     Effort: Pulmonary effort is normal.     Breath sounds: Normal breath sounds.  Abdominal:     General: Abdomen is flat.     Palpations: Abdomen is soft.  Musculoskeletal:        General: Normal range of motion.     Cervical back: Normal range of motion and neck supple.  Skin:    General: Skin is warm.     Capillary Refill: Capillary refill takes less than 2 seconds.  Neurological:     General: No focal deficit present.     Mental Status: He is alert and oriented to person, place, and time.     Comments: ANO x3.  Patient has no facial droop.  No IV deviation.  Normal strength bilateral arms and legs  Psychiatric:        Mood and Affect: Mood normal.         Behavior: Behavior normal.     ED Results / Procedures / Treatments   Labs (all labs ordered are listed, but only abnormal results are displayed) Labs Reviewed  MAGNESIUM - Abnormal; Notable for the following components:      Result Value   Magnesium 1.5 (*)    All other components within normal limits  COMPREHENSIVE METABOLIC PANEL - Abnormal; Notable for the following components:   Potassium 2.7 (*)    Glucose, Bld 144 (*)    BUN 6 (*)    Calcium 8.6 (*)    Albumin 2.9 (*)    AST 11 (*)    Total Bilirubin 1.3 (*)    All other components within normal limits  BRAIN NATRIURETIC PEPTIDE - Abnormal; Notable for the following components:   B Natriuretic Peptide 151.7 (*)    All other components within normal limits  CBG MONITORING, ED - Abnormal; Notable for the following components:   Glucose-Capillary 105 (*)    All other components within normal limits  TROPONIN I (HIGH SENSITIVITY) - Abnormal; Notable for the following components:   Troponin I (High Sensitivity) 25 (*)    All other components within normal limits  CBC WITH DIFFERENTIAL/PLATELET  URINALYSIS, ROUTINE W REFLEX MICROSCOPIC  TROPONIN I (HIGH SENSITIVITY)    EKG EKG Interpretation  Date/Time:  Tuesday July 31 2022 21:43:34 EDT Ventricular Rate:  52 PR Interval:    QRS Duration: 106 QT Interval:  665 QTC Calculation: 619 R Axis:   -17 Text Interpretation: AV block, complete (third degree) Borderline left axis deviation Consider anterior infarct Prolonged QT interval Baseline wander in lead(s) III complete heart block unchanged Confirmed by Wandra Arthurs [id number]) on 07/31/2022 9:59:17 PM  Radiology CT HEAD WO CONTRAST (5MM)  Result Date: 07/31/2022 CLINICAL DATA:  Possible seizure, memory loss. EXAM: CT HEAD WITHOUT CONTRAST TECHNIQUE: Contiguous axial images were obtained from the base of the skull through the vertex without intravenous contrast. RADIATION DOSE REDUCTION: This exam was performed  according to the departmental dose-optimization program which includes automated exposure control, adjustment of the mA and/or kV according to patient size and/or use of iterative reconstruction technique. COMPARISON:  01/02/2022. FINDINGS: Brain: No acute intracranial hemorrhage, midline shift or mass effect. No extra-axial fluid collection. Generalized atrophy is noted. Patchy and confluent white matter hypodensities are noted bilaterally and not significantly changed from the prior exam. A hyperdense structure is noted in the CP angle on the right measuring  1.6 x 0.8 cm. No hydrocephalus. Vascular: Atherosclerotic calcification of the carotid siphons. No hyperdense vessel. Skull: Normal. Negative for fracture or focal lesion. Sinuses/Orbits: Mucosal thickening is noted in the ethmoid air cells. The orbits are within normal limits. Other: Increased density is noted in the scalp over the parietal bone on the right, unchanged from 2021. IMPRESSION: 1. No acute intracranial hemorrhage. 2. Atrophy with chronic microvascular ischemic changes. 3. Dural-based asymmetry in the CP angle on the right, possible meningioma or schwannoma. MRI is recommended for further characterization on follow-up. Electronically Signed   By: Brett Fairy M.D.   On: 07/31/2022 21:18   DG Chest Port 1 View  Result Date: 07/31/2022 CLINICAL DATA:  Altered mental status/seizures EXAM: PORTABLE CHEST 1 VIEW COMPARISON:  Radiographs 10/09/2020 FINDINGS: Low lung volumes. Unchanged borderline enlargement of the cardiomediastinal silhouette. Aortic calcifications. Prominent fat pad at the cardiac apex. No focal consolidation, pleural effusion, or pneumothorax. IMPRESSION: No acute abnormality.  Hypoinflation. Electronically Signed   By: Placido Sou M.D.   On: 07/31/2022 20:39    Procedures Procedures    CRITICAL CARE Performed by: Wandra Arthurs   Total critical care time: 30 minutes  Critical care time was exclusive of separately  billable procedures and treating other patients.  Critical care was necessary to treat or prevent imminent or life-threatening deterioration.  Critical care was time spent personally by me on the following activities: development of treatment plan with patient and/or surrogate as well as nursing, discussions with consultants, evaluation of patient's response to treatment, examination of patient, obtaining history from patient or surrogate, ordering and performing treatments and interventions, ordering and review of laboratory studies, ordering and review of radiographic studies, pulse oximetry and re-evaluation of patient's condition.   Medications Ordered in ED Medications  potassium chloride 10 mEq in 100 mL IVPB (10 mEq Intravenous New Bag/Given 07/31/22 2206)  potassium chloride SA (KLOR-CON M) CR tablet 40 mEq (40 mEq Oral Not Given 07/31/22 2207)  magnesium sulfate IVPB 2 g 50 mL (2 g Intravenous New Bag/Given 07/31/22 2207)  ondansetron (ZOFRAN) injection 4 mg (4 mg Intravenous Given 07/31/22 2205)    ED Course/ Medical Decision Making/ A&P                           Medical Decision Making SEVERO BEBER is a 82 y.o. male here presenting with possible seizure versus syncope.  Noticed that patient is in complete heart block on arrival.  Concern for possible syncope from complete heart block.  Patient has a history of electrolyte abnormalities causing heart block. Patient does not have a pacemaker right now. We will check CBC and CMP and magnesium and troponin and CT head and chest x-ray.  10:15 PM I reviewed patient's labs and imaging studies.  Patient's potassium is 2.7 and magnesium is 1.5 and they were supplemented.  Patient CT head did not show a bleed.  Patient remains in complete heart block with a heart rate in the 40s to 50.  I consulted cardiology to see patient  10:46 PM Dr. Blossom Hoops from cardiology saw patient.  He states that patient needs electrolyte corrected and cardiology will  follow.  He will likely need to get a pacemaker during the admission.   Problems Addressed: Complete heart block ([place]): acute illness or injury Hypokalemia: acute illness or injury Hypomagnesemia: acute illness or injury  Amount and/or Complexity of Data Reviewed Labs: ordered. Decision-making details documented in ED Course.  Radiology: ordered and independent interpretation performed. Decision-making details documented in ED Course. ECG/medicine tests: ordered and independent interpretation performed. Decision-making details documented in ED Course.  Risk Prescription drug management. Decision regarding hospitalization.    Final Clinical Impression(s) / ED Diagnoses Final diagnoses:  None    Rx / DC Orders ED Discharge Orders     None         Drenda Freeze, MD 07/31/22 2249

## 2022-07-31 NOTE — H&P (Signed)
History and Physical    Patient: Andre Stout HDQ:[id number] DOB: 04/17/1940 DOA: 07/31/2022 DOS: the patient was seen and examined on 07/31/2022 PCP: [name], [place]  Patient coming from: {Point_of_Origin:26777}  Chief Complaint:  Chief Complaint  Patient presents with   Seizures   HPI: Andre Stout is a 82 y.o. male with medical history significant of *** had a sz and pasased out. Witnessed.   Review of Systems: {ROS_Text:26778} Past Medical History:  Diagnosis Date   Diabetes mellitus without complication ([place])    Glaucoma    Prostate cancer ([place])    Been 3-4 years ago   Past Surgical History:  Procedure Laterality Date   CATARACT EXTRACTION Bilateral    Social History:  reports that he quit smoking about 47 years ago. His smoking use included cigarettes. He has a 2.00 pack-year smoking history. He has never used smokeless tobacco. He reports that he does not drink alcohol and does not use drugs.  No Known Allergies  Family History  Problem Relation Age of Onset   Diabetes Father    Diabetes Paternal Grandfather     Prior to Admission medications   Medication Sig Start Date End Date Taking? Authorizing Provider  aspirin 81 MG chewable tablet Chew 1 tablet (81 mg total) by mouth daily. 01/06/22   Swayze, Ava, DO  BD INSULIN SYRINGE U/F 31G X 5/16" 1 ML MISC USE 2 DAILY 08/24/21   Renato Shin, MD  brimonidine [facility]) 0.2 % ophthalmic solution Place 1 drop into both eyes in the morning and at bedtime. 12/18/21   [provider]  Cholecalciferol (VITAMIN D3) 2000 UNITS TABS Take 2,000 Units by mouth daily.    [provider]  COMBIGAN 0.2-0.5 % ophthalmic solution Place 1 drop into both eyes 2 (two) times daily. 12/19/15   [provider]  diclofenac (VOLTAREN) 75 MG EC tablet Take 1 tablet (75 mg total) by mouth 2 (two) times daily as needed. 02/01/22   Aundra Dubin, PA-C  insulin NPH-regular Human (HUMULIN 70/30) (70-30) 100  UNIT/ML injection 30 units with breakfast and 42 units with supper. 02/26/22   Renato Shin, MD  latanoprost (XALATAN) 0.005 % ophthalmic solution Place 1 drop into both eyes at bedtime. 01/01/22   [provider]  Magnesium Oxide 400 MG CAPS Take 1 capsule (400 mg total) by mouth daily. 06/06/22   [name], FNP  Misc Natural Products (URINOZINC PO) Take 1 tablet by mouth daily.    [provider]  Multiple Vitamins-Minerals (CENTRUM ADULTS PO) Take 1 tablet by mouth daily.    [provider]  Omega 3 1200 MG CAPS Take 1,200 mg by mouth daily.    [provider]  [facility] DELICA LANCETS 11H MISC Use to check blood sugar 4 times per day. Dx code: E11.9 03/27/16   Renato Shin, MD  [facility] ULTRA test strip USE TO MONITOR GLUCOSE LEVELS 4 TIMES PER DAY E11.9 06/10/19   Renato Shin, MD    Physical Exam: Vitals:   07/31/22 2027 07/31/22 2045 07/31/22 2200 07/31/22 2230  BP:  (!) 183/81 (!) 175/74 (!) 171/72  Pulse:  (!) 54 (!) 49 (!) 51  Resp:  '16 15 15  '$ Temp:      TempSrc:      SpO2:  97% 98% 97%  Weight: 86.2 kg     Height: '5\' 5"'$  (1.651 m)       Confused but awake and conversant B/L LE piutting>3+   Data  Reviewed: {Tip this will not be part of the note when signed- Document your independent interpretation of telemetry tracing, EKG, lab, Radiology test or any other diagnostic tests. Add any new diagnostic test ordered today. (Optional):26781} {Results:26384}  Assessment and Plan: Complete heart block -  Electrolyte imbalance Hypokalemia -  Hypomagnesemia -  Syncope and collapse -  History of seizure -  Diabetes mellitus -  Hypertension -  History of prostate cancer    Advance Care Planning:   Code Status: Prior ***  Consults: ***  Family Communication: ***  Severity of Illness: {Observation/Inpatient:21159}  Author: Quintella Baton, MD 07/31/2022 11:20 PM  For on call review [email or web address].

## 2022-07-31 NOTE — Consult Note (Signed)
Cardiology Consultation:   Patient ID: HARLAND AGUINIGA MRN: [id number]; DOB: Nov 15, 1940  Admit date: 07/31/2022 Date of Consult: 07/31/2022  PCP:  [name], [place] Providers Cardiologist:  Evalina Field, MD        Patient Profile:   DAWAUN BRANCATO is a 82 y.o. male with a hx of diabetes, hypertension, glaucoma, prostatic adenocarcinoma who is being seen 07/31/2022 for the evaluation of heart block at the request of Dr. Darl Householder.  History of Present Illness:   Mr. Winstanley is an 81 year old male with a history as above who was in his usual state of health today when he reports having had a "seizure" on his couch today.  It was witnessed by his grandson who is unavailable to provide history.  He is a bit slow to respond but confirms that he was resting comfortably and then it was lights out.  He's not sure how long he was out and doesn't remember waking up and may have had some confusion following the event.  He denies chest pain, orthopnea, pnd, or palpitations.  He has some occasional leg swelling that improves with elevation of his legs.  He has no prior cardiovascular history aside from an evaluation for similar issues previously in January.  At that time he had severe electrolyte derangements with a normal echocardiogram aside from basal hypertrophy and aortic sclerosis.  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  Here he has moderate to severe hypokalemia with hypomagnesemia.  Glucose is in normal range (unlike prior presentation).  Hematology is normal.  Comorbidities include diabetes on insulin therapy.  He reports adherence to this.  He hasn't had nausea or vomiting but does report frequent, almost daily diarrhea.  Past Medical History:  Diagnosis Date   Diabetes mellitus without complication ([place])    Glaucoma    Prostate cancer ([place])    Been 3-4  years ago    Past Surgical History:  Procedure Laterality Date   CATARACT EXTRACTION Bilateral      Home Medications:  Prior to Admission medications   Medication Sig Start Date End Date Taking? Authorizing Provider  aspirin 81 MG chewable tablet Chew 1 tablet (81 mg total) by mouth daily. 01/06/22   Swayze, Ava, DO  BD INSULIN SYRINGE U/F 31G X 5/16" 1 ML MISC USE 2 DAILY 08/24/21   Renato Shin, MD  brimonidine [facility]) 0.2 % ophthalmic solution Place 1 drop into both eyes in the morning and at bedtime. 12/18/21   [provider]  Cholecalciferol (VITAMIN D3) 2000 UNITS TABS Take 2,000 Units by mouth daily.    [provider]  COMBIGAN 0.2-0.5 % ophthalmic solution Place 1 drop into both eyes 2 (two) times daily. 12/19/15   [provider]  diclofenac (VOLTAREN) 75 MG EC tablet Take 1 tablet (75 mg total) by mouth 2 (two) times daily as needed. 02/01/22   Aundra Dubin, PA-C  insulin NPH-regular Human (HUMULIN 70/30) (70-30) 100 UNIT/ML injection 30 units with breakfast and 42 units with supper. 02/26/22   Renato Shin, MD  latanoprost (XALATAN) 0.005 % ophthalmic solution Place 1 drop into both eyes at bedtime. 01/01/22   [provider]  Magnesium Oxide 400 MG CAPS Take 1 capsule (400 mg total) by mouth daily. 06/06/22   [name], FNP  Misc Natural Products (URINOZINC PO) Take 1 tablet by  mouth daily.    [provider]  Multiple Vitamins-Minerals (CENTRUM ADULTS PO) Take 1 tablet by mouth daily.    [provider]  Omega 3 1200 MG CAPS Take 1,200 mg by mouth daily.    [provider]  [facility] DELICA LANCETS 06C MISC Use to check blood sugar 4 times per day. Dx code: E11.9 03/27/16   Renato Shin, MD  [facility] ULTRA test strip USE TO MONITOR GLUCOSE LEVELS 4 TIMES PER DAY E11.9 06/10/19   Renato Shin, MD    Inpatient Medications: Scheduled Meds:  potassium chloride SA  40 mEq Oral Once   Continuous  Infusions:  magnesium sulfate bolus IVPB 2 g (07/31/22 2207)   potassium chloride 10 mEq (07/31/22 2206)   PRN Meds:   Allergies:   No Known Allergies  Social History:   Social History   Socioeconomic History   Marital status: Widowed    Spouse name: Not on file   Number of children: 1   Years of education: 67   Highest education level: Not on file  Occupational History   Occupation: Retired  Tobacco Use   Smoking status: Former    Packs/day: 0.50    Years: 4.00    Total pack years: 2.00    Types: Cigarettes    Quit date: 10/29/1974    Years since quitting: 47.7   Smokeless tobacco: Never  Vaping Use   Vaping Use: Never used  Substance and Sexual Activity   Alcohol use: No   Drug use: No   Sexual activity: Not Currently  Other Topics Concern   Not on file  Social History Narrative   Born and raised in [place], [place]. Currently reside in a private residence by himself. Daughter lives in the area. No live. Fun: hunt and fish   Denies religious beliefs that would effect health care.    Social Determinants of Health   Financial Resource Strain: Low Risk  (03/26/2022)   Overall Financial Resource Strain (CARDIA)    Difficulty of Paying Living Expenses: Not hard at all  Food Insecurity: No Food Insecurity (03/26/2022)   Hunger Vital Sign    Worried About Running Out of Food in the Last Year: Never true    Ran Out of Food in the Last Year: Never true  Transportation Needs: No Transportation Needs (03/26/2022)   PRAPARE - Hydrologist (Medical): No    Lack of Transportation (Non-Medical): No  Physical Activity: Inactive (03/26/2022)   Exercise Vital Sign    Days of Exercise per Week: 0 days    Minutes of Exercise per Session: 0 min  Stress: No Stress Concern Present (03/26/2022)   Reserve    Feeling of Stress : Not at all  Social Connections: Unknown (05/28/2019)   Social  Connection and Isolation Panel [NHANES]    Frequency of Communication with Friends and Family: More than three times a week    Frequency of Social Gatherings with Friends and Family: More than three times a week    Attends Religious Services: Not on file    Active Member of Clubs or Organizations: Yes    Attends Archivist Meetings: More than 4 times per year    Marital Status: Widowed  Intimate Partner Violence: Not At Risk (03/26/2022)   Humiliation, Afraid, Rape, and Kick questionnaire    Fear of Current or Ex-Partner: No    Emotionally Abused: No    Physically  Abused: No    Sexually Abused: No    Family History:    Family History  Problem Relation Age of Onset   Diabetes Father    Diabetes Paternal Grandfather      ROS:  Please see the history of present illness.  All other ROS reviewed and negative.     Physical Exam/Data:   Vitals:   07/31/22 2027 07/31/22 2045 07/31/22 2200 07/31/22 2230  BP:  (!) 183/81 (!) 175/74 (!) 171/72  Pulse:  (!) 54 (!) 49 (!) 51  Resp:  '16 15 15  '$ Temp:      TempSrc:      SpO2:  97% 98% 97%  Weight: 86.2 kg     Height: '5\' 5"'$  (1.651 m)      No intake or output data in the 24 hours ending 07/31/22 2246    07/31/2022    8:27 PM 03/26/2022    3:38 PM 02/26/2022    2:06 PM  Last 3 Weights  Weight (lbs) 190 lb 201 lb 201 lb 9.6 oz  Weight (kg) 86.183 kg 91.173 kg 91.445 kg     Body mass index is 31.62 kg/m.  General:  Well nourished, well developed, in no acute distress HEENT: normal Neck: prominent A-waves, mildly elevated. Vascular: No carotid bruits; Distal pulses 2+ bilaterally Cardiac:  normal S1, S2; RRR; no murmur Lungs:  clear to auscultation bilaterally, no wheezing, rhonchi or rales  Abd: soft, nontender, no hepatomegaly  Ext: LE edema present Musculoskeletal:  No deformities, BUE and BLE strength normal and equal Skin: warm and dry  Neuro:  CNs 2-12 intact, no focal abnormalities noted Psych:  Normal affect    EKG:  The EKG was personally reviewed and demonstrates:  sinus rhythm with complete heart block, known fascicular block, narrow complex Telemetry:  Telemetry was personally reviewed and demonstrates:  normal sinus rhythm with complete heart block, narrow escape, rates 45-65  Relevant CV Studies: TTE 1/23 G1DD, aortic sclerosis, asymmetric basal septal hypertrophy MCOT 2/23 no complete heart block  Laboratory Data:  High Sensitivity Troponin:   Recent Labs  Lab 07/31/22 2013  TROPONINIHS 25*     Chemistry Recent Labs  Lab 07/31/22 2013  NA 136  K 2.7*  CL 102  CO2 26  GLUCOSE 144*  BUN 6*  CREATININE 1.15  CALCIUM 8.6*  MG 1.5*  GFRNONAA >60  ANIONGAP 8    Recent Labs  Lab 07/31/22 2013  PROT 6.5  ALBUMIN 2.9*  AST 11*  ALT 12  ALKPHOS 77  BILITOT 1.3*   Lipids No results for input(s): "CHOL", "TRIG", "HDL", "LABVLDL", "LDLCALC", "CHOLHDL" in the last 168 hours.  Hematology Recent Labs  Lab 07/31/22 2013  WBC 8.7  RBC 5.40  HGB 14.7  HCT 46.0  MCV 85.2  MCH 27.2  MCHC 32.0  RDW 14.1  PLT 340   Thyroid No results for input(s): "TSH", "FREET4" in the last 168 hours.  BNP Recent Labs  Lab 07/31/22 2013  BNP 151.7*    DDimer No results for input(s): "DDIMER" in the last 168 hours.   Radiology/Studies:  CT HEAD WO CONTRAST (5MM)  Result Date: 07/31/2022 CLINICAL DATA:  Possible seizure, memory loss. EXAM: CT HEAD WITHOUT CONTRAST TECHNIQUE: Contiguous axial images were obtained from the base of the skull through the vertex without intravenous contrast. RADIATION DOSE REDUCTION: This exam was performed according to the departmental dose-optimization program which includes automated exposure control, adjustment of the mA and/or kV according to patient  size and/or use of iterative reconstruction technique. COMPARISON:  01/02/2022. FINDINGS: Brain: No acute intracranial hemorrhage, midline shift or mass effect. No extra-axial fluid collection. Generalized  atrophy is noted. Patchy and confluent white matter hypodensities are noted bilaterally and not significantly changed from the prior exam. A hyperdense structure is noted in the CP angle on the right measuring 1.6 x 0.8 cm. No hydrocephalus. Vascular: Atherosclerotic calcification of the carotid siphons. No hyperdense vessel. Skull: Normal. Negative for fracture or focal lesion. Sinuses/Orbits: Mucosal thickening is noted in the ethmoid air cells. The orbits are within normal limits. Other: Increased density is noted in the scalp over the parietal bone on the right, unchanged from 2021. IMPRESSION: 1. No acute intracranial hemorrhage. 2. Atrophy with chronic microvascular ischemic changes. 3. Dural-based asymmetry in the CP angle on the right, possible meningioma or schwannoma. MRI is recommended for further characterization on follow-up. Electronically Signed   By: Brett Fairy M.D.   On: 07/31/2022 21:18   DG Chest Port 1 View  Result Date: 07/31/2022 CLINICAL DATA:  Altered mental status/seizures EXAM: PORTABLE CHEST 1 VIEW COMPARISON:  Radiographs 10/09/2020 FINDINGS: Low lung volumes. Unchanged borderline enlargement of the cardiomediastinal silhouette. Aortic calcifications. Prominent fat pad at the cardiac apex. No focal consolidation, pleural effusion, or pneumothorax. IMPRESSION: No acute abnormality.  Hypoinflation. Electronically Signed   By: Placido Sou M.D.   On: 07/31/2022 20:39     Assessment and Plan:   82 year old male now with a second presentation for "seizure" in the setting of complete heart block.  His AV block is more persistent at this presentation and his electrolyte abnormalities aren't so severe as to write this off.  I suspect cardiogenic syncope.    Recommend admission for electrolyte repletion and workup of the chronic diarrhea.  I'm not entirely clear what's giving rise to the recurrent severe hypokalemia otherwise as I don't see any offending medications.  Microscopic  colitis would be on the differential given the age.  We will follow along.  I think permanent pacing maneuvers are indicated at this point, and I don't see any clear contraindications, but obviously defer to our electrophysiologists.  Given recurrence of the metabolic derangements, I anticipate the heart block will recur as well.  His escape rhythm is narrow and he is severely hypertensive so no invasive or pharmacologic interventions are required at this time.  Avoid AVN blockers.  If he has decompensation would start dopamine 5 mcg/kg/min first line; notify us and we can reassess.  #CHB #Transient loss of consciousness, suspect cardiogenic syncope - Avoid AVNA - Electrolyte repletion - NPO for possible PPM - Monitor on telemetry  C. Albertha Ghee, MD Cardiology Fellow [facility]   Risk Assessment/Risk Scores:                For questions or updates, please contact [place] Please consult [email or web address] for contact info under    Signed, Delight Hoh, MD  07/31/2022 10:46 PM

## 2022-07-31 NOTE — ED Notes (Signed)
MD Darl Householder made aware of K+ value of 2.7.

## 2022-07-31 NOTE — ED Triage Notes (Signed)
Pt arrived via [facility] for possible seizure. Per grandson on scene, pt was ambulating to restroom when he "locked up" and appeared to have a seizure per family member on scene, followed by confusion and incontinence. Pt presents in urine soaked clothing, both old and new stains. Per family member, pt is normally oriented, at time of EMS arrival pt unable to provide year, grabbing at clothing. Pt was hospitalized after similar event and found to be hyponatremic and hypomagnesia. Presenting as postictal. Intial blood sugar 57, treated with 12.5g D10 via 20g RAC. GCS 14, A&Ox3 for time.   PTA Vitals   HR 60 RR 18 SPO2 94% RA BP 200/102 CBG 57 -> 155 post D10

## 2022-07-31 NOTE — ED Notes (Signed)
Daughter Joaquim Lai [phone] would like an update asap

## 2022-08-01 ENCOUNTER — Inpatient Hospital Stay (HOSPITAL_COMMUNITY): Payer: Medicare Other

## 2022-08-01 ENCOUNTER — Encounter (HOSPITAL_COMMUNITY): Payer: Self-pay | Admitting: Family Medicine

## 2022-08-01 DIAGNOSIS — R197 Diarrhea, unspecified: Secondary | ICD-10-CM | POA: Diagnosis present

## 2022-08-01 DIAGNOSIS — D329 Benign neoplasm of meninges, unspecified: Secondary | ICD-10-CM | POA: Diagnosis present

## 2022-08-01 DIAGNOSIS — I700 Atherosclerosis of aorta: Secondary | ICD-10-CM | POA: Diagnosis present

## 2022-08-01 DIAGNOSIS — E11649 Type 2 diabetes mellitus with hypoglycemia without coma: Secondary | ICD-10-CM | POA: Diagnosis not present

## 2022-08-01 DIAGNOSIS — I442 Atrioventricular block, complete: Secondary | ICD-10-CM

## 2022-08-01 DIAGNOSIS — Z7982 Long term (current) use of aspirin: Secondary | ICD-10-CM | POA: Diagnosis not present

## 2022-08-01 DIAGNOSIS — I10 Essential (primary) hypertension: Secondary | ICD-10-CM | POA: Diagnosis present

## 2022-08-01 DIAGNOSIS — Z8546 Personal history of malignant neoplasm of prostate: Secondary | ICD-10-CM | POA: Diagnosis not present

## 2022-08-01 DIAGNOSIS — R600 Localized edema: Secondary | ICD-10-CM | POA: Diagnosis present

## 2022-08-01 DIAGNOSIS — Z87891 Personal history of nicotine dependence: Secondary | ICD-10-CM | POA: Diagnosis not present

## 2022-08-01 DIAGNOSIS — E876 Hypokalemia: Secondary | ICD-10-CM | POA: Diagnosis present

## 2022-08-01 DIAGNOSIS — I4589 Other specified conduction disorders: Secondary | ICD-10-CM | POA: Diagnosis present

## 2022-08-01 DIAGNOSIS — R4781 Slurred speech: Secondary | ICD-10-CM | POA: Diagnosis present

## 2022-08-01 DIAGNOSIS — Z794 Long term (current) use of insulin: Secondary | ICD-10-CM | POA: Diagnosis not present

## 2022-08-01 DIAGNOSIS — Z9842 Cataract extraction status, left eye: Secondary | ICD-10-CM | POA: Diagnosis not present

## 2022-08-01 DIAGNOSIS — Z79899 Other long term (current) drug therapy: Secondary | ICD-10-CM | POA: Diagnosis not present

## 2022-08-01 DIAGNOSIS — Z833 Family history of diabetes mellitus: Secondary | ICD-10-CM | POA: Diagnosis not present

## 2022-08-01 DIAGNOSIS — Z9841 Cataract extraction status, right eye: Secondary | ICD-10-CM | POA: Diagnosis not present

## 2022-08-01 DIAGNOSIS — H409 Unspecified glaucoma: Secondary | ICD-10-CM | POA: Diagnosis present

## 2022-08-01 DIAGNOSIS — R55 Syncope and collapse: Secondary | ICD-10-CM | POA: Diagnosis present

## 2022-08-01 DIAGNOSIS — E8770 Fluid overload, unspecified: Secondary | ICD-10-CM | POA: Diagnosis present

## 2022-08-01 DIAGNOSIS — E1165 Type 2 diabetes mellitus with hyperglycemia: Secondary | ICD-10-CM | POA: Diagnosis not present

## 2022-08-01 DIAGNOSIS — R569 Unspecified convulsions: Secondary | ICD-10-CM | POA: Diagnosis present

## 2022-08-01 LAB — CBG MONITORING, ED
Glucose-Capillary: 195 mg/dL — ABNORMAL HIGH (ref 70–99)
Glucose-Capillary: 199 mg/dL — ABNORMAL HIGH (ref 70–99)
Glucose-Capillary: 227 mg/dL — ABNORMAL HIGH (ref 70–99)
Glucose-Capillary: 298 mg/dL — ABNORMAL HIGH (ref 70–99)
Glucose-Capillary: 57 mg/dL — ABNORMAL LOW (ref 70–99)
Glucose-Capillary: 58 mg/dL — ABNORMAL LOW (ref 70–99)

## 2022-08-01 LAB — URINALYSIS, ROUTINE W REFLEX MICROSCOPIC
Bilirubin Urine: NEGATIVE
Glucose, UA: NEGATIVE mg/dL
Hgb urine dipstick: NEGATIVE
Ketones, ur: NEGATIVE mg/dL
Leukocytes,Ua: NEGATIVE
Nitrite: NEGATIVE
Protein, ur: 100 mg/dL — AB
Specific Gravity, Urine: 1.008 (ref 1.005–1.030)
pH: 8 (ref 5.0–8.0)

## 2022-08-01 LAB — BASIC METABOLIC PANEL
Anion gap: 9 (ref 5–15)
BUN: 6 mg/dL — ABNORMAL LOW (ref 8–23)
CO2: 27 mmol/L (ref 22–32)
Calcium: 8.7 mg/dL — ABNORMAL LOW (ref 8.9–10.3)
Chloride: 99 mmol/L (ref 98–111)
Creatinine, Ser: 1.08 mg/dL (ref 0.61–1.24)
GFR, Estimated: >60 mL/min (ref >60)
Glucose, Bld: 110 mg/dL — ABNORMAL HIGH (ref 70–99)
Potassium: 3.2 mmol/L — ABNORMAL LOW (ref 3.5–5.1)
Sodium: 135 mmol/L (ref 135–145)

## 2022-08-01 LAB — MAGNESIUM: Magnesium: 1.9 mg/dL (ref 1.7–2.4)

## 2022-08-01 LAB — GLUCOSE, CAPILLARY
Glucose-Capillary: 214 mg/dL — ABNORMAL HIGH (ref 70–99)
Glucose-Capillary: 217 mg/dL — ABNORMAL HIGH (ref 70–99)
Glucose-Capillary: 281 mg/dL — ABNORMAL HIGH (ref 70–99)

## 2022-08-01 MED ORDER — ACETAMINOPHEN 325 MG PO TABS: 650.0000 mg | ORAL_TABLET | Freq: Four times a day (QID) | ORAL | Status: DC | PRN

## 2022-08-01 MED ORDER — DEXTROSE-NACL 5-0.45 % IV SOLN: INTRAVENOUS | Status: DC

## 2022-08-01 MED ORDER — ACETAMINOPHEN 650 MG RE SUPP: 650.0000 mg | Freq: Four times a day (QID) | RECTAL | Status: DC | PRN

## 2022-08-01 MED ORDER — ENOXAPARIN SODIUM 40 MG/0.4ML IJ SOSY: 40.0000 mg | PREFILLED_SYRINGE | INTRAMUSCULAR | Status: DC

## 2022-08-01 MED ORDER — LORAZEPAM 2 MG/ML IJ SOLN
0.5000 mg | Freq: Once | INTRAMUSCULAR | Status: AC
  Administered 2022-08-01: 0.5 mg via INTRAVENOUS

## 2022-08-01 MED ORDER — AMLODIPINE BESYLATE 10 MG PO TABS
10.0000 mg | ORAL_TABLET | Freq: Every day | ORAL | Status: DC
  Administered 2022-08-01 – 2022-08-04 (×4): 10 mg via ORAL
  Filled 2022-08-01: qty 2
  Filled 2022-08-01 (×3): qty 1

## 2022-08-01 MED ORDER — POTASSIUM CHLORIDE CRYS ER 20 MEQ PO TBCR
40.0000 meq | EXTENDED_RELEASE_TABLET | Freq: Once | ORAL | Status: DC
  Filled 2022-08-01: qty 2

## 2022-08-01 MED ORDER — INSULIN GLARGINE-YFGN 100 UNIT/ML ~~LOC~~ SOLN
15.0000 [IU] | Freq: Every day | SUBCUTANEOUS | Status: DC
  Administered 2022-08-02 – 2022-08-03 (×2): 15 [IU] via SUBCUTANEOUS
  Filled 2022-08-01 (×3): qty 0.15

## 2022-08-01 MED ORDER — INSULIN ASPART 100 UNIT/ML IJ SOLN: 0.0000 [IU] | Freq: Three times a day (TID) | INTRAMUSCULAR | Status: DC

## 2022-08-01 MED ORDER — MELATONIN 5 MG PO TABS
5.0000 mg | ORAL_TABLET | Freq: Once | ORAL | Status: AC
Start: 2022-08-02 — End: 2022-08-01
  Administered 2022-08-01: 5 mg via ORAL
  Filled 2022-08-01: qty 1

## 2022-08-01 MED ORDER — DEXTROSE 50 % IV SOLN
INTRAVENOUS | Status: AC
  Filled 2022-08-01: qty 50

## 2022-08-01 MED ORDER — LORAZEPAM 2 MG/ML IJ SOLN
INTRAMUSCULAR | Status: AC
  Filled 2022-08-01: qty 1

## 2022-08-01 MED ORDER — INSULIN ASPART 100 UNIT/ML IJ SOLN
0.0000 [IU] | INTRAMUSCULAR | Status: DC
  Administered 2022-08-01: 8 [IU] via SUBCUTANEOUS
  Administered 2022-08-01: 4 [IU] via SUBCUTANEOUS
  Administered 2022-08-01: 5 [IU] via SUBCUTANEOUS
  Administered 2022-08-01: 3 [IU] via SUBCUTANEOUS
  Administered 2022-08-01: 8 [IU] via SUBCUTANEOUS
  Administered 2022-08-02: 5 [IU] via SUBCUTANEOUS
  Administered 2022-08-02: 8 [IU] via SUBCUTANEOUS

## 2022-08-01 MED ORDER — SENNOSIDES-DOCUSATE SODIUM 8.6-50 MG PO TABS
1.0000 | ORAL_TABLET | Freq: Every evening | ORAL | Status: DC | PRN
Start: 2022-08-01 — End: 2022-08-05

## 2022-08-01 MED ORDER — PROCHLORPERAZINE EDISYLATE 10 MG/2ML IJ SOLN: 10.0000 mg | Freq: Four times a day (QID) | INTRAMUSCULAR | Status: DC | PRN

## 2022-08-01 MED ORDER — GADOBUTROL 1 MMOL/ML IV SOLN
8.0000 mL | Freq: Once | INTRAVENOUS | Status: AC | PRN
  Administered 2022-08-01: 8 mL via INTRAVENOUS

## 2022-08-01 MED ORDER — ORAL CARE MOUTH RINSE: 15.0000 mL | OROMUCOSAL | Status: DC | PRN

## 2022-08-01 MED ORDER — INSULIN ASPART 100 UNIT/ML IJ SOLN: 0.0000 [IU] | Freq: Every day | INTRAMUSCULAR | Status: DC

## 2022-08-01 MED ORDER — KCL IN DEXTROSE-NACL 40-5-0.9 MEQ/L-%-% IV SOLN
INTRAVENOUS | Status: AC
  Filled 2022-08-01: qty 1000

## 2022-08-01 MED ORDER — HYDRALAZINE HCL 20 MG/ML IJ SOLN
5.0000 mg | INTRAMUSCULAR | Status: DC | PRN
  Administered 2022-08-01: 5 mg via INTRAVENOUS
  Filled 2022-08-01 (×2): qty 1

## 2022-08-01 NOTE — ED Notes (Signed)
Pt awake, interactive, NAD, calm, med rec tech at [facility].

## 2022-08-01 NOTE — ED Notes (Signed)
Alert, no change, to MRI

## 2022-08-01 NOTE — ED Notes (Signed)
Holding Semglee d/t NPO status. Hospitalist notified, per hospitalist, wait for cardiology recommendation.

## 2022-08-01 NOTE — ED Notes (Signed)
CBG 58. Given apple juice and Opyd MD called- new orders - see MAR. Pt remain Aox4, no obvious signs of hypoglycemia. Pt reports taking 70:30 at home.

## 2022-08-01 NOTE — ED Notes (Signed)
Patient transported to MRI 

## 2022-08-01 NOTE — ED Notes (Addendum)
Back from MRI. Alert, resting, NAD, calm, interactive, resps e/u. Denies pain.

## 2022-08-01 NOTE — Inpatient Diabetes Management (Signed)
Inpatient Diabetes Program Recommendations  AACE/ADA: New Consensus Statement on Inpatient Glycemic Control (2015)  Target Ranges:  Prepandial:   less than 140 mg/dL      Peak postprandial:   less than 180 mg/dL (1-2 hours)      Critically ill patients:  140 - 180 mg/dL   Lab Results  Component Value Date   GLUCAP 227 (H) 08/01/2022   HGBA1C 8.9 (A) 02/07/2022    Review of Glycemic Control  Diabetes history: *** Outpatient Diabetes medications: *** Current orders for Inpatient glycemic control: ***  Inpatient Diabetes Program Recommendations:

## 2022-08-01 NOTE — Consult Note (Addendum)
Cardiology Consultation:   Patient ID: Andre Stout MRN: [id number]; DOB: 1940-07-25  Admit date: 07/31/2022 Date of Consult: 08/01/2022  PCP:  [name], [facility] Cardiologist:  Evalina Field, MD   {   Patient Profile:   Andre Stout is a 82 y.o. male with a hx of  prostate Ca, HTN, DM who is being seen 08/01/2022 for the evaluation of CHB at the request of Dr. Doristine Bosworth.  History of Present Illness:   Andre Stout was seen previously by EP service Jan 2023 with a witnessed syncopal event (+/- seizure with no known prior hx of this).  He was noted to have CHB in the environment of electrolyte derangement with marked hypokalemia (2.2)( and other lab abnormalities noted were  bicarb 21, glucose 69, calcium 7.4, protein 5.2, albumin 2.4. mild leukocytosis of 11.2)  K+ was real and took aggressive replacement to normalize, EP was consulted had a vague hx of 2 prior syncopal events with unclear details. HRs reported in CHB not to be markedly bradycardic, and had a narrow QRS escape With potassium replacement his conduction did improve to 1:1 with 1st degree AVBlock. TTE noted preserved LVEF, grade I DD, mild-mod basal septal hypertrophy, no significant VHD With recovery of conduction PPM was not recommended with outpt monitoring and close management/monitoring of his electrolytes  Monitor noted  Impression: (read by Dr. Marisue Ivan) 1. No significant arrhythmias.  2. 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 No complete heart block.  3. 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   He saw E. Monge, NP 02/12/22 without recurrent symptoms or syncope, there was mention of metoprolol being adjusted by his PMD, though not noted on his med list, appears to have been stopped then at least.  He was admitted last night with a syncopal event vs seizure, record reports he was laying on the sofa, unclear details by recrd, post event confusion perhaps, found again with  electrolyte derangement and in CHB. Medicine team admitted and cardiology consulted and planned for EP evaluation alongside electrolyte replacement.  HRs 40's-50's and BP elevated/stable  LABS K+ 2.7 > 3.2 Mag 1.5 > 1.9 BUN 6 > 6 Creat 1.15 > 1.08 Albumin 2.9 BNP 151 HS Trop 25 WBC 8.7 H/H 14/46 Plts 340  EMS reviewed:  Slowest HR charted  was 40 BPs 200's/low 100s Described as orients x1, slurred speech Family reported that he stood up from the couch then "locked up" and appeared to have a seizure, were able to help him to lay down BS was 57 and reported as he was given D10 started to clear up Transported ECGs with them with HRs 57, 62, 64bpm, all look like CHB with narrow QRS  Med list reviewed: no PO nodal blocking agents noted, + timolol Pending MRI  He reports no particular symptoms of later or ortherwise. No CP, palpitations No SOB He is swollen, denies DOE, no symptoms of PND, orthopnea. No dizzy spells.  Tells me he fell because his BS was low. Does not recall any speciffics  Past Medical History:  Diagnosis Date   Diabetes mellitus without complication ([place])    Glaucoma    Prostate cancer ([place])    Been 3-4 years ago    Past Surgical History:  Procedure Laterality Date   CATARACT EXTRACTION Bilateral      Home Medications:  Prior to Admission medications   Medication Sig Start Date End Date Taking? Authorizing Provider  aspirin 81 MG chewable  tablet Chew 1 tablet (81 mg total) by mouth daily. Patient taking differently: Chew 81 mg by mouth at bedtime. 01/06/22  Yes Swayze, Ava, DO  brimonidine (ALPHAGAN) 0.2 % ophthalmic solution Place 1 drop into both eyes 2 (two) times daily with breakfast and lunch. 12/18/21  Yes [provider]  cetirizine (ZYRTEC) 10 MG tablet Take 10 mg by mouth daily as needed (seasonal allergies).   Yes [provider]  dorzolamide-timolol (COSOPT) 22.3-6.8 MG/ML ophthalmic solution Place 1 drop into both eyes  2 (two) times daily with breakfast and lunch. 05/18/22  Yes [provider]  ibuprofen (ADVIL) 200 MG tablet Take 400-800 mg by mouth 2 (two) times daily as needed for headache (pain).   Yes [provider]  insulin NPH-regular Human (70-30) 100 UNIT/ML injection Inject 36 Units into the skin 2 (two) times daily with a meal.   Yes [provider]  latanoprost (XALATAN) 0.005 % ophthalmic solution Place 1 drop into both eyes at bedtime. 01/01/22  Yes [provider]  magnesium oxide (MAG-OX) 400 (240 Mg) MG tablet Take 400 mg by mouth at bedtime. 06/06/22  Yes [provider]  Multiple Vitamins-Minerals (PRESERVISION AREDS 2) CAPS Take 1 capsule by mouth 2 (two) times daily.   Yes [provider]  BD INSULIN SYRINGE U/F 31G X 5/16" 1 ML MISC USE 2 DAILY 08/24/21   Renato Shin, MD  diclofenac (VOLTAREN) 75 MG EC tablet Take 1 tablet (75 mg total) by mouth 2 (two) times daily as needed. Patient not taking: Reported on 08/01/2022 02/01/22   Aundra Dubin, PA-C  insulin NPH-regular Human (HUMULIN 70/30) (70-30) 100 UNIT/ML injection 30 units with breakfast and 42 units with supper. Patient not taking: Reported on 08/01/2022 02/26/22   Renato Shin, MD  [facility] DELICA LANCETS 24M MISC Use to check blood sugar 4 times per day. Dx code: E11.9 03/27/16   Renato Shin, MD  [facility] ULTRA test strip USE TO MONITOR GLUCOSE LEVELS 4 TIMES PER DAY E11.9 06/10/19   Renato Shin, MD    Inpatient Medications: Scheduled Meds:  amLODipine  10 mg Oral Daily   aspirin  81 mg Oral Daily   insulin aspart  0-15 Units Subcutaneous Q4H   LORazepam       Continuous Infusions:  PRN Meds: acetaminophen **OR** acetaminophen, hydrALAZINE, LORazepam, prochlorperazine, senna-docusate  Allergies:   No Known Allergies  Social History:   Social History   Socioeconomic History   Marital status: Widowed    Spouse name: Not on file   Number of children: 1   Years of  education: 67   Highest education level: Not on file  Occupational History   Occupation: Retired  Tobacco Use   Smoking status: Former    Packs/day: 0.50    Years: 4.00    Total pack years: 2.00    Types: Cigarettes    Quit date: 10/29/1974    Years since quitting: 47.7   Smokeless tobacco: Never  Vaping Use   Vaping Use: Never used  Substance and Sexual Activity   Alcohol use: No   Drug use: No   Sexual activity: Not Currently  Other Topics Concern   Not on file  Social History Narrative   Born and raised in [place], [place]. Currently reside in a private residence by himself. Daughter lives in the area. No live. Fun: hunt and fish   Denies religious beliefs that would effect health care.    Social Determinants of Health   Financial  Resource Strain: Low Risk  (03/26/2022)   Overall Financial Resource Strain (CARDIA)    Difficulty of Paying Living Expenses: Not hard at all  Food Insecurity: No Food Insecurity (03/26/2022)   Hunger Vital Sign    Worried About Running Out of Food in the Last Year: Never true    Ran Out of Food in the Last Year: Never true  Transportation Needs: No Transportation Needs (03/26/2022)   PRAPARE - Hydrologist (Medical): No    Lack of Transportation (Non-Medical): No  Physical Activity: Inactive (03/26/2022)   Exercise Vital Sign    Days of Exercise per Week: 0 days    Minutes of Exercise per Session: 0 min  Stress: No Stress Concern Present (03/26/2022)   [place]    Feeling of Stress : Not at all  Social Connections: Unknown (05/28/2019)   Social Connection and Isolation Panel [NHANES]    Frequency of Communication with Friends and Family: More than three times a week    Frequency of Social Gatherings with Friends and Family: More than three times a week    Attends Religious Services: Not on file    Active Member of Clubs or Organizations: Yes     Attends Archivist Meetings: More than 4 times per year    Marital Status: Widowed  Intimate Partner Violence: Not At Risk (03/26/2022)   Humiliation, Afraid, Rape, and Kick questionnaire    Fear of Current or Ex-Partner: No    Emotionally Abused: No    Physically Abused: No    Sexually Abused: No    Family History2 Family History  Problem Relation Age of Onset   Diabetes Father    Diabetes Paternal Grandfather      ROS:  Please see the history of present illness.  All other ROS reviewed and negative.     Physical Exam/Data:   Vitals:   08/01/22 0715 08/01/22 0730 08/01/22 0745 08/01/22 0800  BP: (!) 147/62 (!) 140/70    Pulse: (!) 57 60 (!) 58 62  Resp:   15 (!) 23  Temp:      TempSrc:      SpO2: 99% 100% 99% 100%  Weight:      Height:        Intake/Output Summary (Last 24 hours) at 08/01/2022 0901 Last data filed at 08/01/2022 0729 Gross per 24 hour  Intake 400 ml  Output --  Net 400 ml      07/31/2022    8:27 PM 03/26/2022    3:38 PM 02/26/2022    2:06 PM  Last 3 Weights  Weight (lbs) 190 lb 201 lb 201 lb 9.6 oz  Weight (kg) 86.183 kg 91.173 kg 91.445 kg     Body mass index is 31.62 kg/m.  General:  Well nourished, well developed, in no acute distress HEENT: normal Neck: no JVD Vascular: No carotid bruits; Distal pulses 2+ bilaterally Cardiac: RRR; no murmurs, gallops or rubs Lungs:  CTA b/l, no wheezing, rhonchi or rales  Abd: soft, nontender  Ext: 2++ edema Musculoskeletal:  No deformities Skin: warm and dry  Neuro: no gross focal motor abnormalities noted Psych:  Normal affect   EKG:  The EKG was personally reviewed and demonstrates:    CHB 56bpm, QRS 139m, borderline L axis CHB 52bpm, QRS 1062m Telemetry:  Telemetry was personally reviewed and demonstrates:   CB 50's-60's, no rates < 40's  Relevant CV Studies:  TTE 01/03/2022  1. Left ventricular ejection fraction, by estimation, is 55 to 60%. The  left ventricle has normal  function. Left ventricular endocardial border  not optimally defined to evaluate regional wall motion. Appears grossly  normal. There is mild-to-moderate  hypertrophy of the basal septum. The rest of the LV segments demonstrate  mild left ventricular hypertrophy. Left ventricular diastolic parameters  are consistent with Grade I diastolic dysfunction (impaired relaxation).   2. Right ventricular systolic function is normal. The right ventricular  size is normal.   3. The mitral valve is normal in structure. No evidence of mitral valve  regurgitation. No evidence of mitral stenosis.   4. The aortic valve is tricuspid. There is mild calcification of the  aortic valve. There is mild thickening of the aortic valve. Aortic valve  regurgitation is not visualized. Aortic valve sclerosis/calcification is  present, without any evidence of  aortic stenosis.   5. The inferior vena cava is normal in size with greater than 50%  respiratory variability, suggesting right atrial pressure of 3 mmHg.   Laboratory Data:  High Sensitivity Troponin:   Recent Labs  Lab 07/31/22 2013  TROPONINIHS 25*     Chemistry Recent Labs  Lab 07/31/22 2013 08/01/22 0141  NA 136 135  K 2.7* 3.2*  CL 102 99  CO2 26 27  GLUCOSE 144* 110*  BUN 6* 6*  CREATININE 1.15 1.08  CALCIUM 8.6* 8.7*  MG 1.5* 1.9  GFRNONAA >60 >60  ANIONGAP 8 9    Recent Labs  Lab 07/31/22 2013  PROT 6.5  ALBUMIN 2.9*  AST 11*  ALT 12  ALKPHOS 77  BILITOT 1.3*   Lipids No results for input(s): "CHOL", "TRIG", "HDL", "LABVLDL", "LDLCALC", "CHOLHDL" in the last 168 hours.  Hematology Recent Labs  Lab 07/31/22 2013  WBC 8.7  RBC 5.40  HGB 14.7  HCT 46.0  MCV 85.2  MCH 27.2  MCHC 32.0  RDW 14.1  PLT 340   Thyroid No results for input(s): "TSH", "FREET4" in the last 168 hours.  BNP Recent Labs  Lab 07/31/22 2013  BNP 151.7*    DDimer No results for input(s): "DDIMER" in the last 168 hours.   Radiology/Studies:   CT HEAD WO CONTRAST (5MM) Result Date: 07/31/2022 CLINICAL DATA:  Possible seizure, memory loss. EXAM: CT HEAD WITHOUT CONTRAST TECHNIQUE: Contiguous axial images were obtained from the base of the skull through the vertex without intravenous contrast. RADIATION DOSE REDUCTION: This exam was performed according to the departmental dose-optimization program which includes automated exposure control, adjustment of the mA and/or kV according to patient size and/or use of iterative reconstruction technique. COMPARISON:  01/02/2022. FINDINGS: Brain: No acute intracranial hemorrhage, midline shift or mass effect. No extra-axial fluid collection. Generalized atrophy is noted. Patchy and confluent white matter hypodensities are noted bilaterally and not significantly changed from the prior exam. A hyperdense structure is noted in the CP angle on the right measuring 1.6 x 0.8 cm. No hydrocephalus. Vascular: Atherosclerotic calcification of the carotid siphons. No hyperdense vessel. Skull: Normal. Negative for fracture or focal lesion. Sinuses/Orbits: Mucosal thickening is noted in the ethmoid air cells. The orbits are within normal limits. Other: Increased density is noted in the scalp over the parietal bone on the right, unchanged from 2021. IMPRESSION: 1. No acute intracranial hemorrhage. 2. Atrophy with chronic microvascular ischemic changes. 3. Dural-based asymmetry in the CP angle on the right, possible meningioma or schwannoma. MRI is recommended for further characterization on follow-up. Electronically  Signed   By: Brett Fairy M.D.   On: 07/31/2022 21:18   DG Chest Port 1 View  Result Date: 07/31/2022 CLINICAL DATA:  Altered mental status/seizures EXAM: PORTABLE CHEST 1 VIEW COMPARISON:  Radiographs 10/09/2020 FINDINGS: Low lung volumes. Unchanged borderline enlargement of the cardiomediastinal silhouette. Aortic calcifications. Prominent fat pad at the cardiac apex. No focal consolidation, pleural effusion, or  pneumothorax. IMPRESSION: No acute abnormality.  Hypoinflation. Electronically Signed   By: Placido Sou M.D.   On: 07/31/2022 20:39     Assessment and Plan:   CHB Syncope vs seizure No noted significant bradycardic rates by EMS or here Hypokalemic again, low mag as well Low BS in the 50's   He is getting IV runs of K Has gotten IV mag Medicine team managing.  He is hemodynamically stable currently Would follow his clinical course, may need pacing though not today, not urgently BP stable Will need to clarify a mention of metoprolol as a home med? He has Timolol, doubt this will cause CHB  Dr. Lovena Le will see later today  Risk Assessment/Risk Scores:    For questions or updates, please contact [facility] Please consult [email or web address] for contact info under    Signed, Baldwin Jamaica, PA-C  08/01/2022 9:01 AM  EP Attending  Patient seen and examined. Agree with the findings as noted above. The patient presents with recurrent CHB and has yet again been hypokalemic. He will be treated with potassium supplementation. If his heart block improves with potassium repletion, then no PPM will be required. If not then a DDD PM is indicated.  Carleene Overlie Derisha Funderburke,MD

## 2022-08-01 NOTE — Progress Notes (Signed)
PROGRESS NOTE    Andre Stout  RVU:[id number] DOB: 1940/04/04 DOA: 07/31/2022 PCP: [name], FNP   Brief Narrative:  HPI: Andre Stout is a 82 y.o. male with medical history significant for diabetes mellitus, and prostate cancer.  Today while at home he had a 'seizure' episode.  The patient is unable to provide any history around the event.  His grandson was present, 911 was called.     The ER patient noted to have a complete heart block.  He also had several electrolyte derangements.  He is hypokalemic with a potassium of 2.7, hypomagnesemic at 1.5.  Cardiology has been consulted and has seen the patient in the ER.    Assessment & Plan:   Principal Problem:   Complete heart block (HCC) Active Problems:   Benign essential HTN   Hypomagnesemia   Prostate cancer (HCC)   Syncope and collapse   Diabetes (HCC)   Seizure (HCC)   Hypokalemia   Diarrhea  CHB in the setting of patient with electrolyte imbalance of hypokalemia and hypomagnesemia: Cardiology and now EP has also seen this patient.  No plans for pacemaker as of now. External pacing or dopamine drip if HR<40.  TSH pending.  There is some concern that patient might have been taking beta-blocker at home, he cannot confirm.  Will defer to EP cardiology for management.   Electrolyte imbalance Hypokalemia/Hypomagnesemia Potassium better but is still low, will replace.  Magnesium normal.   Syncope and collapse -Suspect related to patient's CHB and electrolyte imbalances -Treating underlying etiology   Extremity edema -Chronic per patient, dependent edema per patient -Most recent echo 1/23 normal EF of 55 to 60%   Diabetes mellitus: Appears to be taking insulin NPH 70-36 units twice daily at home.  He was hypoglycemic yesterday and required D5 for brief period of time, now he is hyperglycemic.  He is not on any long-acting insulin here.  We will start him on Lantus 15 units and continue SSI.   Hypertension:  Blood pressure controlled, continue amlodipine.   History of prostate cancer: Not an acute issue, follow-up outpatient.  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  Patient is totally asymptomatic at this point in time.  Nothing urgent that needs to be done however I will consult neurosurgery later today.  DVT prophylaxis: SCDs Start: 08/01/22 0024   Code Status: Full Code  Family Communication:  None present at bedside.  Plan of care discussed with patient in length and he/she verbalized understanding and agreed with it.  Status is: Inpatient Remains inpatient appropriate because: Unsure plans from cardiology   Estimated body mass index is 31.62 kg/m as calculated from the following:   Height as of this encounter: '5\' 5"'$  (1.651 m).   Weight as of this encounter: 86.2 kg.    Nutritional Assessment: Body mass index is 31.62 kg/m.Marland Kitchen Seen by dietician.  I agree with the assessment and plan as outlined below: Nutrition Status:        . Skin Assessment: I have examined the patient's skin and I agree with the wound assessment as performed by the wound care RN as outlined below:    Consultants:  Cardiology  Procedures:  None  Antimicrobials:  Anti-infectives (From admission, onward)    None  Subjective: Patient seen and examined in the ED, he has no complaints.  He is fully alert and oriented.  Objective: Vitals:   08/01/22 0730 08/01/22 0745 08/01/22 0800 08/01/22 1022  BP: (!) 140/70   (!) 145/60  Pulse: 60 (!) 58 62 67  Resp:  15 (!) 23 16  Temp:    97.9 F (36.6 C)  TempSrc:    Oral  SpO2: 100% 99% 100% 100%  Weight:      Height:        Intake/Output Summary (Last 24 hours) at 08/01/2022 1107 Last data filed  at 08/01/2022 0729 Gross per 24 hour  Intake 400 ml  Output --  Net 400 ml   Filed Weights   07/31/22 2027  Weight: 86.2 kg    Examination:  General exam: Appears calm and comfortable  Respiratory system: Clear to auscultation. Respiratory effort normal. Cardiovascular system: S1 & S2 heard, bradycardia. No JVD, murmurs, rubs, gallops or clicks. No pedal edema. Gastrointestinal system: Abdomen is nondistended, soft and nontender. No organomegaly or masses felt. Normal bowel sounds heard. Central nervous system: Alert and oriented. No focal neurological deficits. Extremities: Symmetric 5 x 5 power. Skin: No rashes, lesions or ulcers Psychiatry: Judgement and insight appear normal. Mood & affect appropriate.    Data Reviewed: I have personally reviewed following labs and imaging studies  CBC: Recent Labs  Lab 07/31/22 2013  WBC 8.7  NEUTROABS 4.6  HGB 14.7  HCT 46.0  MCV 85.2  PLT 283   Basic Metabolic Panel: Recent Labs  Lab 07/31/22 2013 08/01/22 0141  NA 136 135  K 2.7* 3.2*  CL 102 99  CO2 26 27  GLUCOSE 144* 110*  BUN 6* 6*  CREATININE 1.15 1.08  CALCIUM 8.6* 8.7*  MG 1.5* 1.9   GFR: Estimated Creatinine Clearance: 54.2 mL/min (by C-G formula based on SCr of 1.08 mg/dL). Liver Function Tests: Recent Labs  Lab 07/31/22 2013  AST 11*  ALT 12  ALKPHOS 77  BILITOT 1.3*  PROT 6.5  ALBUMIN 2.9*   No results for input(s): "LIPASE", "AMYLASE" in the last 168 hours. No results for input(s): "AMMONIA" in the last 168 hours. Coagulation Profile: No results for input(s): "INR", "PROTIME" in the last 168 hours. Cardiac Enzymes: No results for input(s): "CKTOTAL", "CKMB", "CKMBINDEX", "TROPONINI" in the last 168 hours. BNP (last 3 results) No results for input(s): "PROBNP" in the last 8760 hours. HbA1C: No results for input(s): "HGBA1C" in the last 72 hours. CBG: Recent Labs  Lab 08/01/22 0054 08/01/22 0055 08/01/22 0256 08/01/22 0406 08/01/22 0738   GLUCAP 57* 58* 195* 199* 227*   Lipid Profile: No results for input(s): "CHOL", "HDL", "LDLCALC", "TRIG", "CHOLHDL", "LDLDIRECT" in the last 72 hours. Thyroid Function Tests: No results for input(s): "TSH", "T4TOTAL", "FREET4", "T3FREE", "THYROIDAB" in the last 72 hours. Anemia Panel: No results for input(s): "VITAMINB12", "FOLATE", "FERRITIN", "TIBC", "IRON", "RETICCTPCT" in the last 72 hours. Sepsis Labs: No results for input(s): "PROCALCITON", "LATICACIDVEN" in the last 168 hours.  No results found for this or any previous visit (from the past 240 hour(s)).   Radiology Studies: MR BRAIN W WO CONTRAST  Result Date: 08/01/2022 CLINICAL DATA:  Abnormal CT with possible meningioma or schwannoma EXAM: MRI HEAD WITHOUT AND WITH CONTRAST TECHNIQUE: Multiplanar, multiecho pulse sequences of the brain and surrounding structures were obtained without and with intravenous contrast. CONTRAST:  20m GADAVIST GADOBUTROL 1 MMOL/ML IV SOLN COMPARISON:  None Available. FINDINGS: Motion artifact is present. Brain: There is  a dural-based enhancing extra-axial lesion of the right cerebellopontine angle cistern measuring about 1.8 x 1 x 2 cm. There is some extension into the internal auditory canal along the dura. The 7/8 nerve complex is poorly evaluated on these large field-of-view, motion degraded images. No significant mass effect on the adjacent brachium pontis. No parenchymal edema. There is no acute infarction or intracranial hemorrhage. There is no hydrocephalus or extra-axial fluid collection. Prominence of the ventricles and sulci reflects parenchymal volume loss. Patchy and confluent areas of T2 hyperintensity in the supratentorial and pontine white matter are nonspecific may reflect mild to moderate chronic microvascular ischemic changes. Vascular: Major vessel flow voids at the skull base are preserved. Skull and upper cervical spine: Normal marrow signal is preserved. Sinuses/Orbits: Paranasal sinus  mucosal thickening. Bilateral lens replacements. Other: Sella is unremarkable.  Mastoid air cells are clear. 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 No significant mass effect or parenchymal edema. 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 Electronically Signed   By: Macy Mis M.D.   On: 08/01/2022 09:26   CT HEAD WO CONTRAST (5MM)  Result Date: 07/31/2022 CLINICAL DATA:  Possible seizure, memory loss. EXAM: CT HEAD WITHOUT CONTRAST TECHNIQUE: Contiguous axial images were obtained from the base of the skull through the vertex without intravenous contrast. RADIATION DOSE REDUCTION: This exam was performed according to the departmental dose-optimization program which includes automated exposure control, adjustment of the mA and/or kV according to patient size and/or use of iterative reconstruction technique. COMPARISON:  01/02/2022. FINDINGS: Brain: No acute intracranial hemorrhage, midline shift or mass effect. No extra-axial fluid collection. Generalized atrophy is noted. Patchy and confluent white matter hypodensities are noted bilaterally and not significantly changed from the prior exam. A hyperdense structure is noted in the CP angle on the right measuring 1.6 x 0.8 cm. No hydrocephalus. Vascular: Atherosclerotic calcification of the carotid siphons. No hyperdense vessel. Skull: Normal. Negative for fracture or focal lesion. Sinuses/Orbits: Mucosal thickening is noted in the ethmoid air cells. The orbits are within normal limits. Other: Increased density is noted in the scalp over the parietal bone on the right, unchanged from 2021. IMPRESSION: 1. No acute intracranial hemorrhage. 2. Atrophy with chronic microvascular ischemic changes. 3. Dural-based asymmetry in the CP angle on the right, possible meningioma or schwannoma. MRI is recommended for further characterization on follow-up. Electronically Signed   By: Brett Fairy M.D.    On: 07/31/2022 21:18   DG Chest Port 1 View  Result Date: 07/31/2022 CLINICAL DATA:  Altered mental status/seizures EXAM: PORTABLE CHEST 1 VIEW COMPARISON:  Radiographs 10/09/2020 FINDINGS: Low lung volumes. Unchanged borderline enlargement of the cardiomediastinal silhouette. Aortic calcifications. Prominent fat pad at the cardiac apex. No focal consolidation, pleural effusion, or pneumothorax. IMPRESSION: No acute abnormality.  Hypoinflation. Electronically Signed   By: Placido Sou M.D.   On: 07/31/2022 20:39    Scheduled Meds:  amLODipine  10 mg Oral Daily   aspirin  81 mg Oral Daily   insulin aspart  0-15 Units Subcutaneous Q4H   Continuous Infusions:   LOS: 0 days   Darliss Cheney, MD [facility]  08/01/2022, 11:07 AM   *Please note that this is a verbal dictation therefore any spelling or grammatical errors are due to the "[place] [employer]" system interpretation.  Please page via [place] and do not message via secure chat for urgent patient care matters. Secure chat can be used for non urgent patient care matters.  How to contact the [facility] Attending or Consulting provider [place] or covering provider during after hours [place], for this patient?  Check the care team in [facility] and look for a) attending/consulting [facility] provider listed and b) the [facility] team listed. Page or secure chat [phone]. Log into [email or web address] and use [place]'s universal password to access. If you do not have the password, please contact the hospital operator. Locate the [facility] provider you are looking for under [facility] and page to a number that you can be directly reached. If you still have difficulty reaching the provider, please page the [facility] (Director on Call) for the Hospitalists listed on amion for assistance.

## 2022-08-02 DIAGNOSIS — I442 Atrioventricular block, complete: Secondary | ICD-10-CM | POA: Diagnosis not present

## 2022-08-02 LAB — BASIC METABOLIC PANEL
Anion gap: 8 (ref 5–15)
Anion gap: 7 (ref 5–15)
BUN: <5 mg/dL — ABNORMAL LOW (ref 8–23)
BUN: 6 mg/dL — ABNORMAL LOW (ref 8–23)
CO2: 27 mmol/L (ref 22–32)
CO2: 26 mmol/L (ref 22–32)
Calcium: 8.6 mg/dL — ABNORMAL LOW (ref 8.9–10.3)
Calcium: 8.6 mg/dL — ABNORMAL LOW (ref 8.9–10.3)
Chloride: 102 mmol/L (ref 98–111)
Chloride: 105 mmol/L (ref 98–111)
Creatinine, Ser: 1.15 mg/dL (ref 0.61–1.24)
Creatinine, Ser: 1.09 mg/dL (ref 0.61–1.24)
GFR, Estimated: >60 mL/min (ref >60)
GFR, Estimated: >60 mL/min (ref >60)
Glucose, Bld: 122 mg/dL — ABNORMAL HIGH (ref 70–99)
Glucose, Bld: 192 mg/dL — ABNORMAL HIGH (ref 70–99)
Potassium: 3 mmol/L — ABNORMAL LOW (ref 3.5–5.1)
Potassium: 4 mmol/L (ref 3.5–5.1)
Sodium: 137 mmol/L (ref 135–145)
Sodium: 138 mmol/L (ref 135–145)

## 2022-08-02 LAB — HEMOGLOBIN A1C
Hgb A1c MFr Bld: 8.3 % — ABNORMAL HIGH (ref 4.8–5.6)
Mean Plasma Glucose: 191.51 mg/dL

## 2022-08-02 LAB — GLUCOSE, CAPILLARY
Glucose-Capillary: 70 mg/dL (ref 70–99)
Glucose-Capillary: 275 mg/dL — ABNORMAL HIGH (ref 70–99)
Glucose-Capillary: 244 mg/dL — ABNORMAL HIGH (ref 70–99)
Glucose-Capillary: 150 mg/dL — ABNORMAL HIGH (ref 70–99)
Glucose-Capillary: 205 mg/dL — ABNORMAL HIGH (ref 70–99)

## 2022-08-02 LAB — TSH: TSH: 4.012 u[IU]/mL (ref 0.350–4.500)

## 2022-08-02 LAB — MAGNESIUM
Magnesium: 1.6 mg/dL — ABNORMAL LOW (ref 1.7–2.4)
Magnesium: 1.9 mg/dL (ref 1.7–2.4)

## 2022-08-02 MED ORDER — POTASSIUM CHLORIDE CRYS ER 20 MEQ PO TBCR
40.0000 meq | EXTENDED_RELEASE_TABLET | Freq: Once | ORAL | Status: AC
Start: 2022-08-02 — End: 2022-08-02
  Administered 2022-08-02: 40 meq via ORAL
  Filled 2022-08-02: qty 2

## 2022-08-02 MED ORDER — MAGNESIUM SULFATE 2 GM/50ML IV SOLN
2.0000 g | Freq: Once | INTRAVENOUS | Status: AC
  Administered 2022-08-02: 2 g via INTRAVENOUS
  Filled 2022-08-02: qty 50

## 2022-08-02 MED ORDER — POTASSIUM CHLORIDE 10 MEQ/100ML IV SOLN
10.0000 meq | INTRAVENOUS | Status: AC
  Administered 2022-08-02 (×2): 10 meq via INTRAVENOUS
  Filled 2022-08-02 (×2): qty 100

## 2022-08-02 MED ORDER — INSULIN ASPART 100 UNIT/ML IJ SOLN
0.0000 [IU] | Freq: Three times a day (TID) | INTRAMUSCULAR | Status: DC
  Administered 2022-08-03 (×2): 3 [IU] via SUBCUTANEOUS
  Administered 2022-08-03: 2 [IU] via SUBCUTANEOUS
  Administered 2022-08-04: 4 [IU] via SUBCUTANEOUS
  Administered 2022-08-04: 1 [IU] via SUBCUTANEOUS
  Administered 2022-08-04: 3 [IU] via SUBCUTANEOUS

## 2022-08-02 MED ORDER — INSULIN ASPART 100 UNIT/ML IJ SOLN
0.0000 [IU] | Freq: Every day | INTRAMUSCULAR | Status: DC
  Administered 2022-08-02 – 2022-08-03 (×2): 2 [IU] via SUBCUTANEOUS

## 2022-08-02 MED ORDER — MAGNESIUM SULFATE 2 GM/50ML IV SOLN
2.0000 g | Freq: Once | INTRAVENOUS | Status: AC
Start: 2022-08-02 — End: 2022-08-02
  Administered 2022-08-02: 2 g via INTRAVENOUS
  Filled 2022-08-02: qty 50

## 2022-08-02 MED ORDER — POTASSIUM CHLORIDE CRYS ER 20 MEQ PO TBCR
40.0000 meq | EXTENDED_RELEASE_TABLET | ORAL | Status: AC
  Administered 2022-08-02 (×2): 40 meq via ORAL
  Filled 2022-08-02 (×2): qty 2

## 2022-08-02 MED ORDER — INSULIN ASPART 100 UNIT/ML IJ SOLN
3.0000 [IU] | Freq: Three times a day (TID) | INTRAMUSCULAR | Status: DC
  Administered 2022-08-02 – 2022-08-04 (×7): 3 [IU] via SUBCUTANEOUS

## 2022-08-02 NOTE — Progress Notes (Addendum)
Progress Note  Patient Name: Andre Stout Date of Encounter: 08/02/2022  [facility] Cardiologist: Evalina Field, MD   Subjective   Denies any CP, SOB  Inpatient Medications    Scheduled Meds:  amLODipine  10 mg Oral Daily   aspirin  81 mg Oral Daily   insulin aspart  0-15 Units Subcutaneous Q4H   insulin glargine-yfgn  15 Units Subcutaneous Daily   potassium chloride  40 mEq Oral Q4H   Continuous Infusions:  magnesium sulfate bolus IVPB     PRN Meds: acetaminophen **OR** acetaminophen, hydrALAZINE, mouth rinse, prochlorperazine, senna-docusate   Vital Signs    Vitals:   08/01/22 2012 08/01/22 2100 08/02/22 0402 08/02/22 0848  BP: (!) 143/68  (!) 153/83 (!) 154/83  Pulse: 60     Resp: '10 18 17   '$ Temp: 97.8 F (36.6 C)  97.6 F (36.4 C)   TempSrc: Oral  Oral   SpO2: 100%  100%   Weight:      Height:        Intake/Output Summary (Last 24 hours) at 08/02/2022 0917 Last data filed at 08/02/2022 0500 Gross per 24 hour  Intake 150.47 ml  Output --  Net 150.47 ml      08/01/2022    3:38 PM 07/31/2022    8:27 PM 03/26/2022    3:38 PM  Last 3 Weights  Weight (lbs) 195 lb 15.8 oz 190 lb 201 lb  Weight (kg) 88.9 kg 86.183 kg 91.173 kg      Telemetry    C/w with AV dissociation, CHB, HRs largely 60's, 50's-80's - Personally Reviewed  ECG    No new EKGs - Personally Reviewed  Physical Exam   Pt was seen/examined by Dr. Lovena Le GEN: No acute distress.   Neck: No JVD Cardiac: RRR, no murmurs, rubs, or gallops.  Respiratory: CTA b/l  GI: Soft, nontender, non-distended  MS: No edema; No deformity. Neuro:  Nonfocal  Psych: Normal affect   Labs    High Sensitivity Troponin:   Recent Labs  Lab 07/31/22 2013  TROPONINIHS 25*     Chemistry Recent Labs  Lab 07/31/22 2013 08/01/22 0141 08/02/22 0235  NA 136 135 137  K 2.7* 3.2* 3.0*  CL 102 99 102  CO2 '26 27 27  '$ GLUCOSE 144* 110* 122*  BUN 6* 6* <5*  CREATININE 1.15 1.08 1.15  CALCIUM 8.6*  8.7* 8.6*  MG 1.5* 1.9 1.6*  PROT 6.5  --   --   ALBUMIN 2.9*  --   --   AST 11*  --   --   ALT 12  --   --   ALKPHOS 77  --   --   BILITOT 1.3*  --   --   GFRNONAA >60 >60 >60  ANIONGAP '8 9 8    '$ Lipids No results for input(s): "CHOL", "TRIG", "HDL", "LABVLDL", "LDLCALC", "CHOLHDL" in the last 168 hours.  Hematology Recent Labs  Lab 07/31/22 2013  WBC 8.7  RBC 5.40  HGB 14.7  HCT 46.0  MCV 85.2  MCH 27.2  MCHC 32.0  RDW 14.1  PLT 340   Thyroid No results for input(s): "TSH", "FREET4" in the last 168 hours.  BNP Recent Labs  Lab 07/31/22 2013  BNP 151.7*    DDimer No results for input(s): "DDIMER" in the last 168 hours.   Radiology    MR BRAIN W WO CONTRAST Result Date: 08/01/2022 CLINICAL DATA:  Abnormal CT with possible meningioma or schwannoma  EXAM: MRI HEAD WITHOUT AND WITH CONTRAST TECHNIQUE: Multiplanar, multiecho pulse sequences of the brain and surrounding structures were obtained without and with intravenous contrast. CONTRAST:  19m GADAVIST GADOBUTROL 1 MMOL/ML IV SOLN COMPARISON:  None Available. FINDINGS: Motion artifact is present. Brain: There is a dural-based enhancing extra-axial lesion of the right cerebellopontine angle cistern measuring about 1.8 x 1 x 2 cm. There is some extension into the internal auditory canal along the dura. The 7/8 nerve complex is poorly evaluated on these large field-of-view, motion degraded images. No significant mass effect on the adjacent brachium pontis. No parenchymal edema. There is no acute infarction or intracranial hemorrhage. There is no hydrocephalus or extra-axial fluid collection. Prominence of the ventricles and sulci reflects parenchymal volume loss. Patchy and confluent areas of T2 hyperintensity in the supratentorial and pontine white matter are nonspecific may reflect mild to moderate chronic microvascular ischemic changes. Vascular: Major vessel flow voids at the skull base are preserved. Skull and upper cervical  spine: Normal marrow signal is preserved. Sinuses/Orbits: Paranasal sinus mucosal thickening. Bilateral lens replacements. Other: Sella is unremarkable.  Mastoid air cells are clear. 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 No significant mass effect or parenchymal edema. 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 Electronically Signed   By: PMacy MisM.D.   On: 08/01/2022 09:26   CT HEAD WO CONTRAST (5MM) Result Date: 07/31/2022 CLINICAL DATA:  Possible seizure, memory loss. EXAM: CT HEAD WITHOUT CONTRAST TECHNIQUE: Contiguous axial images were obtained from the base of the skull through the vertex without intravenous contrast. RADIATION DOSE REDUCTION: This exam was performed according to the departmental dose-optimization program which includes automated exposure control, adjustment of the mA and/or kV according to patient size and/or use of iterative reconstruction technique. COMPARISON:  01/02/2022. FINDINGS: Brain: No acute intracranial hemorrhage, midline shift or mass effect. No extra-axial fluid collection. Generalized atrophy is noted. Patchy and confluent white matter hypodensities are noted bilaterally and not significantly changed from the prior exam. A hyperdense structure is noted in the CP angle on the right measuring 1.6 x 0.8 cm. No hydrocephalus. Vascular: Atherosclerotic calcification of the carotid siphons. No hyperdense vessel. Skull: Normal. Negative for fracture or focal lesion. Sinuses/Orbits: Mucosal thickening is noted in the ethmoid air cells. The orbits are within normal limits. Other: Increased density is noted in the scalp over the parietal bone on the right, unchanged from 2021. IMPRESSION: 1. No acute intracranial hemorrhage. 2. Atrophy with chronic microvascular ischemic changes. 3. Dural-based asymmetry in the CP angle on the right, possible meningioma or schwannoma. MRI is recommended for further  characterization on follow-up. Electronically Signed   By: LBrett FairyM.D.   On: 07/31/2022 21:18   DG Chest Port 1 View Result Date: 07/31/2022 CLINICAL DATA:  Altered mental status/seizures EXAM: PORTABLE CHEST 1 VIEW COMPARISON:  Radiographs 10/09/2020 FINDINGS: Low lung volumes. Unchanged borderline enlargement of the cardiomediastinal silhouette. Aortic calcifications. Prominent fat pad at the cardiac apex. No focal consolidation, pleural effusion, or pneumothorax. IMPRESSION: No acute abnormality.  Hypoinflation. Electronically Signed   By: TPlacido SouM.D.   On: 07/31/2022 20:39    Cardiac Studies   TTE 01/03/2022  1. Left ventricular ejection fraction, by estimation, is 55 to 60%. The  left ventricle has normal function. Left ventricular endocardial border  not optimally defined to evaluate regional wall motion. Appears grossly  normal. There is mild-to-moderate  hypertrophy of the basal septum. The rest of the  LV segments demonstrate  mild left ventricular hypertrophy. Left ventricular diastolic parameters  are consistent with Grade I diastolic dysfunction (impaired relaxation).   2. Right ventricular systolic function is normal. The right ventricular  size is normal.   3. The mitral valve is normal in structure. No evidence of mitral valve  regurgitation. No evidence of mitral stenosis.   4. The aortic valve is tricuspid. There is mild calcification of the  aortic valve. There is mild thickening of the aortic valve. Aortic valve  regurgitation is not visualized. Aortic valve sclerosis/calcification is  present, without any evidence of  aortic stenosis.   5. The inferior vena cava is normal in size with greater than 50%  respiratory variability, suggesting right atrial pressure of 3 mmHg.   Patient Profile     82 y.o. male prostate Ca, HTN, DM admitted with ? Of seizure, hypoglycemia, hypokalemia, hypomag, electrolyte imbalances, again in CHB   Assessment & Plan     CHB/isorhythmic dissociation. No bradycardia Narrow baseline QRS Electrolytes remain/recurrently abnormal Suspect nutritional issues at home?   Dr,. Lovena Le has seen/examined the patient Timolol not felt to cause CHB Avoid AV nodal blocking agents otherwise For now no PPM indicated with stable HRs and QRS. eep his electrolytes replaced No need to hold NPO from our perspective   For questions or updates, please contact [place] Please consult [email or web address] for contact info under   Signed, Baldwin [place], PA-C  08/02/2022, 9:17 AM    EP Attending  Patient seen and examined. Agree with above. The patient is improved. He still has some heart block with a normal VR. At this point, no plans for PPM. Keep potassium and magnesium replete.  Carleene Overlie Mackinzee Roszak,MD

## 2022-08-02 NOTE — Evaluation (Signed)
Physical Therapy Evaluation Patient Details Name: Andre Stout MRN: [id number] DOB: Nov 30, 1940 Today's Date: 08/02/2022  History of Present Illness  Pt is an 82 y/o male admitted secondary to seizure like episode. 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 PMH includes HTN, prostate CA, DM, and glaucoma.  Clinical Impression  Pt admitted secondary to problem above with deficits below. Cognitive deficits noted, however, may be pt's baseline. Question visual deficits as well, but pt reports he can see normally. Requiring min guard A For transfers and gait using RW this session. Recommending HHPT at d/c to address mobility deficits. Will continue to follow acutely.        Recommendations for follow up therapy are one component of a multi-disciplinary discharge planning process, led by the attending physician.  Recommendations may be updated based on patient status, additional functional criteria and insurance authorization.  Follow Up Recommendations Home health PT      Assistance Recommended at Discharge Frequent or constant Supervision/Assistance  Patient can return home with the following  A little help with walking and/or transfers;A little help with bathing/dressing/bathroom;Assistance with cooking/housework;Help with stairs or ramp for entrance;Assist for transportation;Direct supervision/assist for financial management;Direct supervision/assist for medications management    Equipment Recommendations None recommended by PT  Recommendations for Other Services       Functional Status Assessment Patient has had a recent decline in their functional status and demonstrates the ability to make significant improvements in function in a reasonable and predictable amount of time.     Precautions / Restrictions Precautions Precautions: Fall Restrictions Weight Bearing Restrictions: No Other Position/Activity Restrictions: n      Mobility  Bed  Mobility Overal bed mobility: Needs Assistance Bed Mobility: Supine to Sit, Sit to Supine     Supine to sit: Mod assist Sit to supine: Min guard   General bed mobility comments: Mod A for trunk assist. Min guard for safety to return to supine.    Transfers Overall transfer level: Needs assistance Equipment used: Rolling walker (2 wheels) Transfers: Sit to/from Stand Sit to Stand: Min guard           General transfer comment: Min guard for safety.    Ambulation/Gait Ambulation/Gait assistance: Min guard Gait Distance (Feet): 100 Feet Assistive device: Rolling walker (2 wheels) Gait Pattern/deviations: Step-through pattern, Decreased stride length Gait velocity: Decreased     General Gait Details: Required directional cues throughout. Min guard for safety throughout. Reporting BLE pain which limited mobility  Stairs            Wheelchair Mobility    Modified Rankin (Stroke Patients Only)       Balance Overall balance assessment: Needs assistance Sitting-balance support: No upper extremity supported, Feet supported Sitting balance-Leahy Scale: Fair     Standing balance support: Bilateral upper extremity supported Standing balance-Leahy Scale: Poor Standing balance comment: Reliant on BUE support                             Pertinent Vitals/Pain Pain Assessment Pain Assessment: Faces Faces Pain Scale: Hurts little more Pain Location: R knee > L knee Pain Descriptors / Indicators: Grimacing, Guarding Pain Intervention(s): Limited activity within patient's tolerance, Monitored during session, Repositioned    Home Living Family/patient expects to be discharged to:: Private residence Living Arrangements: Other relatives (grandson) Available Help at Discharge: Family;Available 24 hours/day Type of Home: House Home Access: Stairs to enter Entrance Stairs-Rails: None Entrance  Stairs-Number of Steps: 5   Home Layout: One level Home  Equipment: Conservation officer, nature (2 wheels);Cane - single point      Prior Function Prior Level of Function : Needs assist             Mobility Comments: Has been using RW for mobility       Hand Dominance        Extremity/Trunk Assessment   Upper Extremity Assessment Upper Extremity Assessment: Defer to OT evaluation    Lower Extremity Assessment Lower Extremity Assessment: Generalized weakness (reporting knee pain in R>L)    Cervical / Trunk Assessment Cervical / Trunk Assessment: Kyphotic  Communication   Communication: No difficulties  Cognition Arousal/Alertness: Awake/alert Behavior During Therapy: WFL for tasks assessed/performed Overall Cognitive Status: No family/caregiver present to determine baseline cognitive functioning                                 General Comments: Required cues to stay on task. Directional cues required as well. Decreased safety awareness.        General Comments General comments (skin integrity, edema, etc.): No family present    Exercises     Assessment/Plan    PT Assessment Patient needs continued PT services  PT Problem List Decreased strength;Decreased activity tolerance;Decreased balance;Decreased mobility;Decreased cognition;Decreased knowledge of use of DME;Decreased safety awareness;Decreased knowledge of precautions;Pain       PT Treatment Interventions DME instruction;Stair training;Gait training;Functional mobility training;Therapeutic activities;Therapeutic exercise;Balance training;Patient/family education    PT Goals (Current goals can be found in the Care Plan section)  Acute Rehab PT Goals Patient Stated Goal: to go home PT Goal Formulation: With patient Time For Goal Achievement: 08/16/22 Potential to Achieve Goals: Good    Frequency Min 3X/week     Co-evaluation               AM-PAC PT "6 Clicks" Mobility  Outcome Measure Help needed turning from your back to your side while in a  flat bed without using bedrails?: A Little Help needed moving from lying on your back to sitting on the side of a flat bed without using bedrails?: A Lot Help needed moving to and from a bed to a chair (including a wheelchair)?: A Little Help needed standing up from a chair using your arms (e.g., wheelchair or bedside chair)?: A Little Help needed to walk in hospital room?: A Little Help needed climbing 3-5 steps with a railing? : A Lot 6 Click Score: 16    End of Session Equipment Utilized During Treatment: Gait belt Activity Tolerance: Patient tolerated treatment well Patient left: in bed;with call bell/phone within reach;with bed alarm set;with nursing/sitter in room Nurse Communication: Mobility status PT Visit Diagnosis: Unsteadiness on feet (R26.81);Muscle weakness (generalized) (M62.81);Difficulty in walking, not elsewhere classified (R26.2)    Time: 5631-4970 PT Time Calculation (min) (ACUTE ONLY): 23 min   Charges:   PT Evaluation $PT Eval Moderate Complexity: 1 Mod PT Treatments $Gait Training: 8-22 mins        [name], DPT  Acute Rehabilitation Services  Office: [phone]   Rudean Hitt 08/02/2022, 11:53 AM

## 2022-08-02 NOTE — Evaluation (Signed)
Occupational Therapy Evaluation Patient Details Name: Andre Stout MRN: [id number] DOB: 18-Sep-1940 Today's Date: 08/02/2022   History of Present Illness Pt is an 82 y/o male admitted secondary to seizure like episode. 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 PMH includes HTN, prostate CA, DM, and glaucoma.   Clinical Impression   PTA patient reports independent and driving.  Admitted for above and presents with problem list below, including impaired cognition, decreased activity tolerance, impaired balance and generalized weakness. Patient currently requires up to mod assist for bed mobility, min guard to min assist for transfers and min-max assist for Adls.  Pt oriented to self and place only during session, unable to recall month after re-oriented.  He scored 26/28 on short blessed test with significant deficits in recall, orientation, attention and sequencing. Poor safety awareness.  Educated pt on safety and recommendations.  If pt does not have 24/7 support at dc (poor historian), will need SNF. Will follow acutely.      Recommendations for follow up therapy are one component of a multi-disciplinary discharge planning process, led by the attending physician.  Recommendations may be updated based on patient status, additional functional criteria and insurance authorization.   Follow Up Recommendations  Home health OT (SNF if does not have 24/7)    Assistance Recommended at Discharge Frequent or constant Supervision/Assistance  Patient can return home with the following A little help with walking and/or transfers;A lot of help with bathing/dressing/bathroom;Assistance with cooking/housework;Direct supervision/assist for medications management;Direct supervision/assist for financial management;Assist for transportation;Help with stairs or ramp for entrance    Functional Status Assessment  Patient has had a recent decline in their functional status and  demonstrates the ability to make significant improvements in function in a reasonable and predictable amount of time.  Equipment Recommendations  BSC/3in1;Other (comment) (RW)    Recommendations for Other Services       Precautions / Restrictions Precautions Precautions: Fall Restrictions Weight Bearing Restrictions: No Other Position/Activity Restrictions: n      Mobility Bed Mobility Overal bed mobility: Needs Assistance Bed Mobility: Sit to Supine, Supine to Sit     Supine to sit: Mod assist Sit to supine: Min guard   General bed mobility comments: Mod A for trunk assist. Min guard for safety to return to supine.    Transfers Overall transfer level: Needs assistance Equipment used: Rolling walker (2 wheels) Transfers: Sit to/from Stand Sit to Stand: Min assist, Min guard           General transfer comment: min guard to min assist to power up, cueing for hand placement and safety      Balance Overall balance assessment: Needs assistance Sitting-balance support: No upper extremity supported, Feet supported Sitting balance-Leahy Scale: Fair     Standing balance support: Bilateral upper extremity supported, During functional activity Standing balance-Leahy Scale: Poor Standing balance comment: Reliant on BUE support                           ADL either performed or assessed with clinical judgement   ADL Overall ADL's : Needs assistance/impaired     Grooming: Minimal assistance;Standing           Upper Body Dressing : Minimal assistance;Sitting   Lower Body Dressing: Maximal assistance;Sit to/from stand Lower Body Dressing Details (indicate cue type and reason): require assist with socks, min assist in standing  Functional mobility during ADLs: Rolling walker (2 wheels);Min guard;Minimal assistance       Vision   Additional Comments: hx of glaucoma but denies vision issues. brief assessment with quadrant testing WFL,  but funcitonally demonstrating difficulty with RW and avoiding obstalces completely     Perception     Praxis      Pertinent Vitals/Pain Pain Assessment Pain Assessment: No/denies pain     Hand Dominance Right   Extremity/Trunk Assessment Upper Extremity Assessment Upper Extremity Assessment: Generalized weakness (hx of L shoulder chronic deficits)   Lower Extremity Assessment Lower Extremity Assessment: Defer to PT evaluation   Cervical / Trunk Assessment Cervical / Trunk Assessment: Kyphotic   Communication Communication Communication: No difficulties   Cognition Arousal/Alertness: Awake/alert Behavior During Therapy: WFL for tasks assessed/performed Overall Cognitive Status: No family/caregiver present to determine baseline cognitive functioning Area of Impairment: Orientation, Attention, Memory, Following commands, Safety/judgement, Awareness, Problem solving                 Orientation Level: Disoriented to, Time, Situation Current Attention Level: Focused Memory: Decreased recall of precautions, Decreased short-term memory Following Commands: Follows one step commands consistently, Follows one step commands with increased time, Follows multi-step commands inconsistently Safety/Judgement: Decreased awareness of safety, Decreased awareness of deficits Awareness: Intellectual Problem Solving: Slow processing, Decreased initiation, Difficulty sequencing, Requires verbal cues, Requires tactile cues General Comments: pt oriented to self and place, reports Nov (and then sept) with reorientation on time--pt unable to recall during session.  Short blessed test completed with pt scoring: 26/28 (significant impairment) with deficits in orientation, attention, sequencing and recall. When asked what to do if there was a fire, pt reports "get the hell out" but requires mod cueing to problem solve calling 911     General Comments  no family present    Exercises      Shoulder Instructions      Home Living Family/patient expects to be discharged to:: Private residence Living Arrangements: Other relatives (grandson and his daughter) Available Help at Discharge: Family;Available PRN/intermittently Type of Home: House Home Access: Stairs to enter [employer] of Steps: 5 Entrance Stairs-Rails: None Home Layout: One level     Bathroom Shower/Tub: Teacher, early years/pre: Standard     Home Equipment: Conservation officer, nature (2 wheels);Cane - single point   Additional Comments: pt reports grandson there all the time initally, then reports his grandson works during [employer] ay      Prior Functioning/Environment Prior Level of Function : Independent/Modified Independent;Driving;History of Falls (last six months)             Mobility Comments: Has been using RW for mobility ADLs Comments: pt reports independent with ADLs, grandson assist with IADLs (meals, meds)        OT Problem List: Decreased strength;Decreased activity tolerance;Impaired balance (sitting and/or standing);Impaired vision/perception;Decreased safety awareness;Decreased knowledge of use of DME or AE;Decreased knowledge of precautions;Obesity;Decreased cognition      OT Treatment/Interventions: Self-care/ADL training;Therapeutic exercise;DME and/or AE instruction;Therapeutic activities;Cognitive remediation/compensation;Visual/perceptual remediation/compensation;Patient/family education;Balance training    OT Goals(Current goals can be found in the care plan section) Acute Rehab OT Goals Patient Stated Goal: home OT Goal Formulation: With patient Time For Goal Achievement: 08/16/22 Potential to Achieve Goals: Good  OT Frequency: Min 2X/week    Co-evaluation              AM-PAC OT "6 Clicks" Daily Activity     Outcome Measure Help from another person eating meals?: A Little Help  from another person taking care of personal grooming?: A Little Help from  another person toileting, which includes using toliet, bedpan, or urinal?: A Little Help from another person bathing (including washing, rinsing, drying)?: A Lot Help from another person to put on and taking off regular upper body clothing?: A Little Help from another person to put on and taking off regular lower body clothing?: A Lot 6 Click Score: 16   End of Session Equipment Utilized During Treatment: Rolling walker (2 wheels) Nurse Communication: Mobility status;Other (comment) (cog)  Activity Tolerance: Patient tolerated treatment well Patient left: in bed;with call bell/phone within reach;with bed alarm set  OT Visit Diagnosis: Other abnormalities of gait and mobility (R26.89);Muscle weakness (generalized) (M62.81);Other symptoms and signs involving cognitive function                Time: 5320-2334 OT Time Calculation (min): 27 min Charges:  OT General Charges $OT Visit: 1 Visit OT Evaluation $OT Eval Moderate Complexity: 1 Mod OT Treatments $Self Care/Home Management : 8-22 mins  Jolaine Artist, OT Acute Rehabilitation Services Office [phone]   Delight Stare 08/02/2022, 1:02 PM

## 2022-08-02 NOTE — Progress Notes (Signed)
PROGRESS NOTE    Andre Stout  OZH:[id number] DOB: March 09, 1940 DOA: 07/31/2022 PCP: [name], FNP   Brief Narrative:  HPI: Andre Stout is a 82 y.o. male with medical history significant for diabetes mellitus, and prostate cancer.  Today while at home he had a 'seizure' episode.  The patient is unable to provide any history around the event.  His grandson was present, 911 was called.     The ER patient noted to have a complete heart block.  He also had several electrolyte derangements.  He is hypokalemic with a potassium of 2.7, hypomagnesemic at 1.5.  Cardiology has been consulted and has seen the patient in the ER.    Assessment & Plan:   Principal Problem:   Complete heart block (HCC) Active Problems:   Benign essential HTN   Hypomagnesemia   Prostate cancer (HCC)   Syncope and collapse   Diabetes (HCC)   Seizure (HCC)   Hypokalemia   Diarrhea  CHB in the setting of patient with electrolyte imbalance of hypokalemia and hypomagnesemia: Cardiology and now EP has also seen this patient.  Heart rate improving with electrolyte replacement.  No plans for pacemaker as of now.  Will reorder TSH.   Electrolyte imbalance Hypokalemia/Hypomagnesemia Low again, will replace, monitor.   Syncope and collapse -Suspect related to patient's CHB and electrolyte imbalances -Treating underlying etiology   Extremity edema -Chronic per patient, dependent edema per patient -Most recent echo 1/23 normal EF of 55 to 60%   Diabetes mellitus: Appears to be taking insulin NPH 70-36 units twice daily at home.  He was hypoglycemic day before yesterday and required D5 for brief period of time, then he was hyperglycemic for which Lantus was started but he was hypoglycemic this morning again.  He already got the Lantus 15 units this morning.  We will add Premeal NovoLog 3 units per diabetes coordinator recommendations.   Hypertension: Blood pressure controlled, continue amlodipine.    History of prostate cancer: Not an acute issue, follow-up outpatient.  Incidental finding of meningioma: Neurosurgery consulted.  DVT prophylaxis: SCDs Start: 08/01/22 0024   Code Status: Full Code  Family Communication:  None present at bedside.  Plan of care discussed with patient in length and he/she verbalized understanding and agreed with it.  Status is: Inpatient Remains inpatient appropriate because: Unsure plans from cardiology   Estimated body mass index is 32.61 kg/m as calculated from the following:   Height as of this encounter: '5\' 5"'$  (1.651 m).   Weight as of this encounter: 88.9 kg.    Nutritional Assessment: Body mass index is 32.61 kg/m.Marland Kitchen Seen by dietician.  I agree with the assessment and plan as outlined below: Nutrition Status:        . Skin Assessment: I have examined the patient's skin and I agree with the wound assessment as performed by the wound care RN as outlined below:    Consultants:  Cardiology  Procedures:  None  Antimicrobials:  Anti-infectives (From admission, onward)    None         Subjective: Seen and examined.  He has no complaints.  Objective: Vitals:   08/02/22 0402 08/02/22 0848 08/02/22 1008 08/02/22 1115  BP: (!) 153/83 (!) 154/83 (!) 164/70 (!) 176/84  Pulse:   71 67  Resp: '17  15 15  '$ Temp: 97.6 F (36.4 C)  98.1 F (36.7 C) 98.1 F (36.7 C)  TempSrc: Oral  Oral Oral  SpO2: 100%  98% 95%  Weight:      Height:        Intake/Output Summary (Last 24 hours) at 08/02/2022 1332 Last data filed at 08/02/2022 0500 Gross per 24 hour  Intake 150.47 ml  Output --  Net 150.47 ml    Filed Weights   07/31/22 2027 08/01/22 1538  Weight: 86.2 kg 88.9 kg    Examination:  General exam: Appears calm and comfortable  Respiratory system: Clear to auscultation. Respiratory effort normal. Cardiovascular system: S1 & S2 heard, RRR. No JVD, murmurs, rubs, gallops or clicks. No pedal edema. Gastrointestinal system:  Abdomen is nondistended, soft and nontender. No organomegaly or masses felt. Normal bowel sounds heard. Central nervous system: Alert and oriented. No focal neurological deficits. Extremities: Symmetric 5 x 5 power. Skin: No rashes, lesions or ulcers.  Psychiatry: Judgement and insight appear normal. Mood & affect appropriate.    Data Reviewed: I have personally reviewed following labs and imaging studies  CBC: Recent Labs  Lab 07/31/22 2013  WBC 8.7  NEUTROABS 4.6  HGB 14.7  HCT 46.0  MCV 85.2  PLT 338    Basic Metabolic Panel: Recent Labs  Lab 07/31/22 2013 08/01/22 0141 08/02/22 0235  NA 136 135 137  K 2.7* 3.2* 3.0*  CL 102 99 102  CO2 '26 27 27  '$ GLUCOSE 144* 110* 122*  BUN 6* 6* <5*  CREATININE 1.15 1.08 1.15  CALCIUM 8.6* 8.7* 8.6*  MG 1.5* 1.9 1.6*    GFR: Estimated Creatinine Clearance: 51.7 mL/min (by C-G formula based on SCr of 1.15 mg/dL). Liver Function Tests: Recent Labs  Lab 07/31/22 2013  AST 11*  ALT 12  ALKPHOS 77  BILITOT 1.3*  PROT 6.5  ALBUMIN 2.9*    No results for input(s): "LIPASE", "AMYLASE" in the last 168 hours. No results for input(s): "AMMONIA" in the last 168 hours. Coagulation Profile: No results for input(s): "INR", "PROTIME" in the last 168 hours. Cardiac Enzymes: No results for input(s): "CKTOTAL", "CKMB", "CKMBINDEX", "TROPONINI" in the last 168 hours. BNP (last 3 results) No results for input(s): "PROBNP" in the last 8760 hours. HbA1C: Recent Labs    08/02/22 0235  HGBA1C 8.3*   CBG: Recent Labs  Lab 08/01/22 2026 08/01/22 2337 08/02/22 0419 08/02/22 0903 08/02/22 1119  GLUCAP 214* 281* 70 275* 244*    Lipid Profile: No results for input(s): "CHOL", "HDL", "LDLCALC", "TRIG", "CHOLHDL", "LDLDIRECT" in the last 72 hours. Thyroid Function Tests: No results for input(s): "TSH", "T4TOTAL", "FREET4", "T3FREE", "THYROIDAB" in the last 72 hours. Anemia Panel: No results for input(s): "VITAMINB12", "FOLATE",  "FERRITIN", "TIBC", "IRON", "RETICCTPCT" in the last 72 hours. Sepsis Labs: No results for input(s): "PROCALCITON", "LATICACIDVEN" in the last 168 hours.  No results found for this or any previous visit (from the past 240 hour(s)).   Radiology Studies: MR BRAIN W WO CONTRAST  Result Date: 08/01/2022 CLINICAL DATA:  Abnormal CT with possible meningioma or schwannoma EXAM: MRI HEAD WITHOUT AND WITH CONTRAST TECHNIQUE: Multiplanar, multiecho pulse sequences of the brain and surrounding structures were obtained without and with intravenous contrast. CONTRAST:  57m GADAVIST GADOBUTROL 1 MMOL/ML IV SOLN COMPARISON:  None Available. FINDINGS: Motion artifact is present. Brain: There is a dural-based enhancing extra-axial lesion of the right cerebellopontine angle cistern measuring about 1.8 x 1 x 2 cm. There is some extension into the internal auditory canal along the dura. The 7/8 nerve complex is poorly evaluated on these large field-of-view, motion degraded images. No significant mass effect on the adjacent brachium  pontis. No parenchymal edema. There is no acute infarction or intracranial hemorrhage. There is no hydrocephalus or extra-axial fluid collection. Prominence of the ventricles and sulci reflects parenchymal volume loss. Patchy and confluent areas of T2 hyperintensity in the supratentorial and pontine white matter are nonspecific may reflect mild to moderate chronic microvascular ischemic changes. Vascular: Major vessel flow voids at the skull base are preserved. Skull and upper cervical spine: Normal marrow signal is preserved. Sinuses/Orbits: Paranasal sinus mucosal thickening. Bilateral lens replacements. Other: Sella is unremarkable.  Mastoid air cells are clear. 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 No significant mass effect or parenchymal edema. 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 Electronically Signed   By: Macy Mis M.D.   On: 08/01/2022 09:26   CT HEAD WO CONTRAST (5MM)  Result Date: 07/31/2022 CLINICAL DATA:  Possible seizure, memory loss. EXAM: CT HEAD WITHOUT CONTRAST TECHNIQUE: Contiguous axial images were obtained from the base of the skull through the vertex without intravenous contrast. RADIATION DOSE REDUCTION: This exam was performed according to the departmental dose-optimization program which includes automated exposure control, adjustment of the mA and/or kV according to patient size and/or use of iterative reconstruction technique. COMPARISON:  01/02/2022. FINDINGS: Brain: No acute intracranial hemorrhage, midline shift or mass effect. No extra-axial fluid collection. Generalized atrophy is noted. Patchy and confluent white matter hypodensities are noted bilaterally and not significantly changed from the prior exam. A hyperdense structure is noted in the CP angle on the right measuring 1.6 x 0.8 cm. No hydrocephalus. Vascular: Atherosclerotic calcification of the carotid siphons. No hyperdense vessel. Skull: Normal. Negative for fracture or focal lesion. Sinuses/Orbits: Mucosal thickening is noted in the ethmoid air cells. The orbits are within normal limits. Other: Increased density is noted in the scalp over the parietal bone on the right, unchanged from 2021. IMPRESSION: 1. No acute intracranial hemorrhage. 2. Atrophy with chronic microvascular ischemic changes. 3. Dural-based asymmetry in the CP angle on the right, possible meningioma or schwannoma. MRI is recommended for further characterization on follow-up. Electronically Signed   By: Brett Fairy M.D.   On: 07/31/2022 21:18   DG Chest Port 1 View  Result Date: 07/31/2022 CLINICAL DATA:  Altered mental status/seizures EXAM: PORTABLE CHEST 1 VIEW COMPARISON:  Radiographs 10/09/2020 FINDINGS: Low lung volumes. Unchanged borderline enlargement of the cardiomediastinal silhouette. Aortic calcifications. Prominent fat pad at the cardiac apex. No  focal consolidation, pleural effusion, or pneumothorax. IMPRESSION: No acute abnormality.  Hypoinflation. Electronically Signed   By: Placido Sou M.D.   On: 07/31/2022 20:39    Scheduled Meds:  amLODipine  10 mg Oral Daily   aspirin  81 mg Oral Daily   insulin aspart  0-15 Units Subcutaneous Q4H   insulin aspart  3 Units Subcutaneous TID WC   insulin glargine-yfgn  15 Units Subcutaneous Daily   potassium chloride  40 mEq Oral Q4H   Continuous Infusions:   LOS: 1 day   Darliss Cheney, MD [facility]  08/02/2022, 1:32 PM   *Please note that this is a verbal dictation therefore any spelling or grammatical errors are due to the "[place] One" system interpretation.  Please page via [place] and do not message via secure chat for urgent patient care matters. Secure chat can be used for non urgent patient care matters.  How to contact the [facility] Attending or Consulting provider [place] or covering provider during after hours [place], for this patient?  Check the care team in [facility] and look for a) attending/consulting TRH provider listed and b) the [facility] team listed. Page or secure chat [phone]. Log into [email or web address] and use [place]'s universal password to access. If you do not have the password, please contact the hospital operator. Locate the [facility] provider you are looking for under [facility] and page to a number that you can be directly reached. If you still have difficulty reaching the provider, please page the [facility] (Director on Call) for the Hospitalists listed on amion for assistance.

## 2022-08-03 DIAGNOSIS — I442 Atrioventricular block, complete: Secondary | ICD-10-CM | POA: Diagnosis not present

## 2022-08-03 LAB — BASIC METABOLIC PANEL
Anion gap: 10 (ref 5–15)
BUN: 5 mg/dL — ABNORMAL LOW (ref 8–23)
CO2: 23 mmol/L (ref 22–32)
Calcium: 8.8 mg/dL — ABNORMAL LOW (ref 8.9–10.3)
Chloride: 102 mmol/L (ref 98–111)
Creatinine, Ser: 1.11 mg/dL (ref 0.61–1.24)
GFR, Estimated: >60 mL/min (ref >60)
Glucose, Bld: 307 mg/dL — ABNORMAL HIGH (ref 70–99)
Potassium: 4.5 mmol/L (ref 3.5–5.1)
Sodium: 135 mmol/L (ref 135–145)

## 2022-08-03 LAB — GLUCOSE, CAPILLARY
Glucose-Capillary: 244 mg/dL — ABNORMAL HIGH (ref 70–99)
Glucose-Capillary: 269 mg/dL — ABNORMAL HIGH (ref 70–99)
Glucose-Capillary: 263 mg/dL — ABNORMAL HIGH (ref 70–99)
Glucose-Capillary: 207 mg/dL — ABNORMAL HIGH (ref 70–99)

## 2022-08-03 LAB — MAGNESIUM: Magnesium: 1.6 mg/dL — ABNORMAL LOW (ref 1.7–2.4)

## 2022-08-03 MED ORDER — ALUM & MAG HYDROXIDE-SIMETH 200-200-20 MG/5ML PO SUSP
30.0000 mL | ORAL | Status: DC | PRN
  Administered 2022-08-03: 30 mL via ORAL
  Filled 2022-08-03: qty 30

## 2022-08-03 MED ORDER — INSULIN GLARGINE-YFGN 100 UNIT/ML ~~LOC~~ SOLN
20.0000 [IU] | Freq: Every day | SUBCUTANEOUS | Status: DC
  Administered 2022-08-04: 20 [IU] via SUBCUTANEOUS
  Filled 2022-08-03: qty 0.2

## 2022-08-03 MED ORDER — MAGNESIUM SULFATE 2 GM/50ML IV SOLN
2.0000 g | Freq: Once | INTRAVENOUS | Status: AC
  Administered 2022-08-03: 2 g via INTRAVENOUS
  Filled 2022-08-03: qty 50

## 2022-08-03 NOTE — Progress Notes (Signed)
Patient refused vitals.

## 2022-08-03 NOTE — Progress Notes (Signed)
Patient ID: Andre Stout, male   DOB: 1940-12-31, 82 y.o.   MRN: [id number]   Patient's CT head and MRI brain reviewed.  The imaging revealed an approximately 2 cm mass in the right cerebellopontine angle cistern that is consistent with meningioma.  No other acute intracranial abnormalities were appreciated.  The meningioma is an incidental finding and not the cause of any of the patient's symptoms.  There is no acute neurosurgical intervention indicated at this time.  I would recommend the patient follow-up as an outpatient in 6 months with a repeat MRI.   Marvis Moeller, DNP, AGNP-C Neurosurgery Nurse Practitioner  [facility] [address], [place], [place] [address] P: [phone]    F: [phone]  08/03/2022 2:18 PM

## 2022-08-03 NOTE — Progress Notes (Signed)
PROGRESS NOTE    Andre Stout  QVZ:[id number] DOB: 1940/11/27 DOA: 07/31/2022 PCP: [name], FNP   Brief Narrative:  HPI: Andre Stout is a 82 y.o. male with medical history significant for diabetes mellitus, and prostate cancer.  Today while at home he had a 'seizure' episode.  The patient is unable to provide any history around the event.  His grandson was present, 911 was called.     The ER patient noted to have a complete heart block.  He also had several electrolyte derangements.  He is hypokalemic with a potassium of 2.7, hypomagnesemic at 1.5. Cardiology has been consulted and has seen the patient in the ER.    Assessment & Plan:  CHB in the setting of patient with electrolyte imbalance of hypokalemia and hypomagnesemia: Cardiology and now EP has also seen this patient.  Heart rate improving with electrolyte replacement.  No plans for pacemaker as of now.  Will reorder TSH.   Electrolyte imbalance Hypokalemia/Hypomagnesemia Low again, will replace, monitor.   Syncope and collapse -Suspect related to patient's CHB and electrolyte imbalances -PT/OT recommended home health PT/OT   Extremity edema -Chronic per patient, dependent edema per patient -Most recent echo 1/23 normal EF of 55 to 60%   Diabetes mellitus: Appears to be taking insulin NPH 70-36 units twice daily at home.  -Blood sugar has been running high. -Continue sliding scale insulin.  Increased Semglee from 15 units to 20 units -Monitor blood sugar closely  hypertension: Blood pressure has been running high.  Continue current dose of amlodipine.  Hydralazine as needed.  Monitor blood pressure closely   History of prostate cancer: Not an acute issue, follow-up outpatient.  Incidental finding of meningioma: Neurosurgery was consulted-recommended no intervention.  Follow-up in 6 months.    DVT prophylaxis: SCDs Start: 08/01/22 0024   Code Status: Full Code  Family Communication:  None present at  bedside.  Plan of care discussed with patient in length and he/she verbalized understanding and agreed with it.  Status is: Inpatient Remains inpatient appropriate because: Unsure plans from cardiology   Consultants:  Cardiology  Procedures:  None   Subjective: Seen and examined.  He has no complaints.  Denies chest pain, shortness of breath or palpitations.  No acute events overnight.  Objective: Vitals:   08/02/22 2035 08/03/22 0017 08/03/22 0833 08/03/22 1157  BP: (!) 157/70 (!) 184/71 (!) 178/87 (!) 147/86  Pulse: 66 63 64 67  Resp: '15 20 15 19  '$ Temp:  98 F (36.7 C) 98.8 F (37.1 C) 98 F (36.7 C)  TempSrc: Oral Oral Oral Oral  SpO2: 98% 96% 98% 99%  Weight:      Height:        Intake/Output Summary (Last 24 hours) at 08/03/2022 1417 Last data filed at 08/02/2022 1800 Gross per 24 hour  Intake --  Output 1100 ml  Net -1100 ml    Filed Weights   07/31/22 2027 08/01/22 1538  Weight: 86.2 kg 88.9 kg    Examination:  General exam: Appears calm and comfortable, on room air, communicating well Respiratory system: Clear to auscultation. Respiratory effort normal. Cardiovascular system: S1 & S2 heard, RRR. No JVD, murmurs, rubs, gallops or clicks.  Bilateral pedal edema positive  gastrointestinal system: Abdomen is nondistended, soft and nontender. No organomegaly or masses felt. Normal bowel sounds heard. Central nervous system: Alert and oriented. No focal neurological deficits. Extremities: Symmetric 5 x 5 power. Skin: No rashes, lesions or ulcers.  Psychiatry:  Judgement and insight appear normal. Mood & affect appropriate.    Data Reviewed: I have personally reviewed following labs and imaging studies  CBC: Recent Labs  Lab 07/31/22 2013  WBC 8.7  NEUTROABS 4.6  HGB 14.7  HCT 46.0  MCV 85.2  PLT 196    Basic Metabolic Panel: Recent Labs  Lab 07/31/22 2013 08/01/22 0141 08/02/22 0235 08/02/22 2205 08/03/22 1025  NA 136 135 137 138 135  K  2.7* 3.2* 3.0* 4.0 4.5  CL 102 99 102 105 102  CO2 '26 27 27 26 23  '$ GLUCOSE 144* 110* 122* 192* 307*  BUN 6* 6* <5* 6* 5*  CREATININE 1.15 1.08 1.15 1.09 1.11  CALCIUM 8.6* 8.7* 8.6* 8.6* 8.8*  MG 1.5* 1.9 1.6* 1.9 1.6*    GFR: Estimated Creatinine Clearance: 53.5 mL/min (by C-G formula based on SCr of 1.11 mg/dL). Liver Function Tests: Recent Labs  Lab 07/31/22 2013  AST 11*  ALT 12  ALKPHOS 77  BILITOT 1.3*  PROT 6.5  ALBUMIN 2.9*    No results for input(s): "LIPASE", "AMYLASE" in the last 168 hours. No results for input(s): "AMMONIA" in the last 168 hours. Coagulation Profile: No results for input(s): "INR", "PROTIME" in the last 168 hours. Cardiac Enzymes: No results for input(s): "CKTOTAL", "CKMB", "CKMBINDEX", "TROPONINI" in the last 168 hours. BNP (last 3 results) No results for input(s): "PROBNP" in the last 8760 hours. HbA1C: Recent Labs    08/02/22 0235  HGBA1C 8.3*    CBG: Recent Labs  Lab 08/02/22 1119 08/02/22 1540 08/02/22 2034 08/03/22 0616 08/03/22 1005  GLUCAP 244* 150* 205* 244* 269*    Lipid Profile: No results for input(s): "CHOL", "HDL", "LDLCALC", "TRIG", "CHOLHDL", "LDLDIRECT" in the last 72 hours. Thyroid Function Tests: Recent Labs    08/02/22 1438  TSH 4.012   Anemia Panel: No results for input(s): "VITAMINB12", "FOLATE", "FERRITIN", "TIBC", "IRON", "RETICCTPCT" in the last 72 hours. Sepsis Labs: No results for input(s): "PROCALCITON", "LATICACIDVEN" in the last 168 hours.  No results found for this or any previous visit (from the past 240 hour(s)).   Radiology Studies: No results found.  Scheduled Meds:  amLODipine  10 mg Oral Daily   aspirin  81 mg Oral Daily   insulin aspart  0-5 Units Subcutaneous QHS   insulin aspart  0-6 Units Subcutaneous TID WC   insulin aspart  3 Units Subcutaneous TID WC   [START ON 08/04/2022] insulin glargine-yfgn  20 Units Subcutaneous Daily   Continuous Infusions:  magnesium sulfate  bolus IVPB       LOS: 2 days   Mckinley Jewel, MD [facility]  08/03/2022, 2:17 PM   *Please note that this is a verbal dictation therefore any spelling or grammatical errors are due to the "[place] One" system interpretation.  Please page via [place] and do not message via secure chat for urgent patient care matters. Secure chat can be used for non urgent patient care matters.  How to contact the [facility] Attending or Consulting provider [place] or covering provider during after hours [place], for this patient?  Check the care team in [facility] and look for a) attending/consulting TRH provider listed and b) the [facility] team listed. Page or secure chat [phone]. Log into [email or web address] and use 's universal password to access. If you do not have the password, please contact the hospital operator. Locate the [facility] provider you are looking for under [facility] and page to a number that  you can be directly reached. If you still have difficulty reaching the provider, please page the [facility] (Director on Call) for the Hospitalists listed on amion for assistance.

## 2022-08-03 NOTE — Progress Notes (Signed)
Physical Therapy Treatment Patient Details Name: Andre Stout MRN: [id number] DOB: 01-13-1940 Today's Date: 08/03/2022   History of Present Illness Pt is an 82 y/o male admitted secondary to seizure like episode. 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 PMH includes HTN, prostate CA, DM, and glaucoma.    PT Comments    Pt received OOB in recliner on arrival and agreeable to session. Pt demonstrating increased ambulation tolerance with RW despite c/o RLE pain, pt requiring assist for RW management and directional cues as pt with poor environmental awareness and trouble negotiating obstacles in hall. Encouraged elevation of pt LE's as noted edema present, RN aware, and AP throughout day as well as importance of continued mobility with pt verbalizing understanding. Pt able to complete 5x STS from low recliner with min guard at end of session. Pt continues to benefit from skilled PT services to progress toward functional mobility goals.    Recommendations for follow up therapy are one component of a multi-disciplinary discharge planning process, led by the attending physician.  Recommendations may be updated based on patient status, additional functional criteria and insurance authorization.  Follow Up Recommendations  Home health PT     Assistance Recommended at Discharge Frequent or constant Supervision/Assistance  Patient can return home with the following A little help with walking and/or transfers;A little help with bathing/dressing/bathroom;Assistance with cooking/housework;Help with stairs or ramp for entrance;Assist for transportation;Direct supervision/assist for financial management;Direct supervision/assist for medications management   Equipment Recommendations  None recommended by PT    Recommendations for Other Services       Precautions / Restrictions Precautions Precautions: Fall Restrictions Weight Bearing Restrictions: No      Mobility  Bed Mobility Overal bed mobility: Needs Assistance Bed Mobility: Sit to Supine       Sit to supine: Min guard        Transfers Overall transfer level: Needs assistance Equipment used: Rolling walker (2 wheels) Transfers: Sit to/from Stand Sit to Stand: Min assist, Min guard           General transfer comment: min assist to power up from recliner at start, min guard at end of session with task practice    Ambulation/Gait Ambulation/Gait assistance: Min guard, Min assist Gait Distance (Feet): 200 Feet Assistive device: Rolling walker (2 wheels) Gait Pattern/deviations: Step-through pattern, Decreased stride length Gait velocity: Decreased     General Gait Details: Required directional cues throughout with assist for RW management. Min guard for Barrister's clerk    Modified Rankin (Stroke Patients Only)       Balance Overall balance assessment: Needs assistance Sitting-balance support: No upper extremity supported, Feet supported Sitting balance-Leahy Scale: Fair     Standing balance support: Bilateral upper extremity supported, During functional activity, Single extremity supported Standing balance-Leahy Scale: Poor Standing balance comment: one extremity support for self care at sink                            Cognition Arousal/Alertness: Awake/alert Behavior During Therapy: WFL for tasks assessed/performed Overall Cognitive Status: No family/caregiver present to determine baseline cognitive functioning Area of Impairment: Orientation, Attention, Memory, Following commands, Safety/judgement, Awareness, Problem solving                 Orientation Level: Disoriented to, Time, Situation Current Attention Level: Focused Memory: Decreased recall of  precautions, Decreased short-term memory Following Commands: Follows one step commands consistently, Follows one step commands with increased  time, Follows multi-step commands inconsistently Safety/Judgement: Decreased awareness of safety, Decreased awareness of deficits Awareness: Intellectual Problem Solving: Slow processing, Decreased initiation, Difficulty sequencing, Requires verbal cues, Requires tactile cues General Comments: able to fololow directions and requiered cues for safety        Exercises General Exercises - Lower Extremity Ankle Circles/Pumps: AROM, 20 reps, Supine (encouraged throughout day as edema present on BLE) Other Exercises Other Exercises: 5x STS from recliner with UE use    General Comments General comments (skin integrity, edema, etc.): Lab present at end of session and requesting pt back in bed      Pertinent Vitals/Pain Pain Assessment Pain Assessment: Faces Faces Pain Scale: Hurts little more Pain Location: R knee > L knee Pain Descriptors / Indicators: Grimacing, Guarding Pain Intervention(s): Monitored during session, Limited activity within patient's tolerance, Repositioned    Home Living                          Prior Function            PT Goals (current goals can now be found in the care plan section) Acute Rehab PT Goals PT Goal Formulation: With patient Time For Goal Achievement: 08/16/22    Frequency    Min 3X/week      PT Plan      Co-evaluation              AM-PAC PT "6 Clicks" Mobility   Outcome Measure  Help needed turning from your back to your side while in a flat bed without using bedrails?: A Little Help needed moving from lying on your back to sitting on the side of a flat bed without using bedrails?: A Lot Help needed moving to and from a bed to a chair (including a wheelchair)?: A Little Help needed standing up from a chair using your arms (e.g., wheelchair or bedside chair)?: A Little Help needed to walk in hospital room?: A Little Help needed climbing 3-5 steps with a railing? : A Lot 6 Click Score: 16    End of Session    Activity Tolerance: Patient tolerated treatment well Patient left: in bed;with call bell/phone within reach;with bed alarm set;with nursing/sitter in room Nurse Communication: Mobility status PT Visit Diagnosis: Unsteadiness on feet (R26.81);Muscle weakness (generalized) (M62.81);Difficulty in walking, not elsewhere classified (R26.2)     Time: 7209-4709 PT Time Calculation (min) (ACUTE ONLY): 28 min  Charges:  $Gait Training: 8-22 mins $Therapeutic Activity: 8-22 mins                     Wakeelah Solan R. PTA Acute Rehabilitation Services Office: [phone]    Rutherford Limerick 08/03/2022, 1:14 PM

## 2022-08-03 NOTE — Inpatient Diabetes Management (Signed)
Inpatient Diabetes Program Recommendations  AACE/ADA: New Consensus Statement on Inpatient Glycemic Control (2015)  Target Ranges:  Prepandial:   less than 140 mg/dL      Peak postprandial:   less than 180 mg/dL (1-2 hours)      Critically ill patients:  140 - 180 mg/dL   Lab Results  Component Value Date   GLUCAP 269 (H) 08/03/2022   HGBA1C 8.3 (H) 08/02/2022    Review of Glycemic Control  Latest Reference Range & Units 08/02/22 09:03 08/02/22 11:19 08/02/22 15:40 08/02/22 20:34 08/03/22 06:16 08/03/22 10:05  Glucose-Capillary 70 - 99 mg/dL 275 (H) 244 (H) 150 (H) 205 (H) 244 (H) 269 (H)  (H): Data is abnormally high  Diabetes history: DM2 Outpatient Diabetes medications: 70/30 36 units BID Current orders for Inpatient glycemic control: Semglee 15 units QD, Novolog 0-6 units TID and 0-5 QHS, Novolog 3 units TID with meals  Inpatient Diabetes Program Recommendations:    Please consider:  Semglee 20 units QHS (could administer 5 additional units now as he has already received 15 units this am).    Will continue to follow while inpatient.  Thank you, Reche Dixon, MSN, [place] Diabetes Coordinator Inpatient Diabetes Program [phone] (team pager from 8a-5p)

## 2022-08-03 NOTE — Care Management Important Message (Signed)
Important Message  Patient Details  Name: Andre Stout MRN: [id number] Date of Birth: 1940/09/19   Medicare Important Message Given:  Yes     Orbie Pyo 08/03/2022, 2:17 PM

## 2022-08-03 NOTE — Plan of Care (Signed)
  Problem: Activity: Goal: Risk for activity intolerance will decrease Outcome: Progressing   

## 2022-08-03 NOTE — Progress Notes (Signed)
Occupational Therapy Treatment Patient Details Name: Andre Stout MRN: [id number] DOB: 1940/08/13 Today's Date: 08/03/2022   History of present illness Pt is an 82 y/o male admitted secondary to seizure like episode. 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 PMH includes HTN, prostate CA, DM, and glaucoma.   OT comments  Patient received in supine and agreeable to OT session. Patient able to get to EOB with min assist. Patient stood from EOB with min assist and verbal cues for hand placement. Patient ambulated to sink with complaints of BLE knee pain. Self care performed at sink with min assist to min guard assist. Patient able to ambulated to recliner with min guard assist and cues for safety. Acute OT to continue to follow.    Recommendations for follow up therapy are one component of a multi-disciplinary discharge planning process, led by the attending physician.  Recommendations may be updated based on patient status, additional functional criteria and insurance authorization.    Follow Up Recommendations  Home health OT (SNF if doesn't have 24 hour supervision)    Assistance Recommended at Discharge Frequent or constant Supervision/Assistance  Patient can return home with the following  A little help with walking and/or transfers;A lot of help with bathing/dressing/bathroom;Assistance with cooking/housework;Direct supervision/assist for medications management;Direct supervision/assist for financial management;Assist for transportation;Help with stairs or ramp for entrance   Equipment Recommendations  BSC/3in1;Other (comment) (RW)    Recommendations for Other Services      Precautions / Restrictions Precautions Precautions: Fall Restrictions Weight Bearing Restrictions: No       Mobility Bed Mobility Overal bed mobility: Needs Assistance Bed Mobility: Supine to Sit     Supine to sit: Min assist     General bed mobility comments: min  assist and increased time to get to EOB    Transfers Overall transfer level: Needs assistance Equipment used: Rolling walker (2 wheels) Transfers: Sit to/from Stand Sit to Stand: Min assist, Min guard           General transfer comment: min assist to power up and min guard for transfer with cues for hand placement     Balance Overall balance assessment: Needs assistance Sitting-balance support: No upper extremity supported, Feet supported Sitting balance-Leahy Scale: Fair     Standing balance support: Bilateral upper extremity supported, During functional activity, Single extremity supported Standing balance-Leahy Scale: Poor Standing balance comment: one extremity support for self care at sink                           ADL either performed or assessed with clinical judgement   ADL Overall ADL's : Needs assistance/impaired     Grooming: Min guard;Standing   Upper Body Bathing: Min guard;Standing Upper Body Bathing Details (indicate cue type and reason): at sink Lower Body Bathing: Min guard;Sit to/from stand Lower Body Bathing Details (indicate cue type and reason): for peri area cleaning Upper Body Dressing : Minimal assistance;Sitting Upper Body Dressing Details (indicate cue type and reason): donned gown                   General ADL Comments: verbal cues for sequencing and safety    Extremity/Trunk Assessment              Vision       Perception     Praxis      Cognition Arousal/Alertness: Awake/alert Behavior During Therapy: WFL for tasks assessed/performed Overall Cognitive Status:  No family/caregiver present to determine baseline cognitive functioning Area of Impairment: Orientation, Attention, Memory, Following commands, Safety/judgement, Awareness, Problem solving                 Orientation Level: Disoriented to, Time, Situation Current Attention Level: Focused Memory: Decreased recall of precautions, Decreased  short-term memory Following Commands: Follows one step commands consistently, Follows one step commands with increased time, Follows multi-step commands inconsistently Safety/Judgement: Decreased awareness of safety, Decreased awareness of deficits Awareness: Intellectual Problem Solving: Slow processing, Decreased initiation, Difficulty sequencing, Requires verbal cues, Requires tactile cues General Comments: able to fololow directions and requiered cues for safety        Exercises      Shoulder Instructions       General Comments      Pertinent Vitals/ Pain       Pain Assessment Pain Assessment: Faces Faces Pain Scale: Hurts a little bit Pain Location: R knee > L knee Pain Descriptors / Indicators: Grimacing, Guarding Pain Intervention(s): Monitored during session, Repositioned  Home Living                                          Prior Functioning/Environment              Frequency  Min 2X/week        Progress Toward Goals  OT Goals(current goals can now be found in the care plan section)  Progress towards OT goals: Progressing toward goals  Acute Rehab OT Goals Patient Stated Goal: get better OT Goal Formulation: With patient Time For Goal Achievement: 08/16/22 Potential to Achieve Goals: Good ADL Goals Pt Will Perform Grooming: standing;with supervision Pt Will Perform Lower Body Dressing: with supervision;with adaptive equipment;sit to/from stand Pt Will Transfer to Toilet: with supervision;bedside commode;ambulating Pt Will Perform Toileting - Clothing Manipulation and hygiene: with supervision;sit to/from stand Additional ADL Goal #1: Pt will recall and complete 3 step trail making task with no more than supervision using compensatory techniques as needed.  Plan Discharge plan remains appropriate    Co-evaluation                 AM-PAC OT "6 Clicks" Daily Activity     Outcome Measure   Help from another person eating  meals?: A Little Help from another person taking care of personal grooming?: A Little Help from another person toileting, which includes using toliet, bedpan, or urinal?: A Little Help from another person bathing (including washing, rinsing, drying)?: A Lot Help from another person to put on and taking off regular upper body clothing?: A Little Help from another person to put on and taking off regular lower body clothing?: A Lot 6 Click Score: 16    End of Session Equipment Utilized During Treatment: Rolling walker (2 wheels)  OT Visit Diagnosis: Other abnormalities of gait and mobility (R26.89);Muscle weakness (generalized) (M62.81);Other symptoms and signs involving cognitive function   Activity Tolerance Patient tolerated treatment well   Patient Left in chair;with call bell/phone within reach;with chair alarm set;with nursing/sitter in room   Nurse Communication Mobility status        Time: 1950-9326 OT Time Calculation (min): 26 min  Charges: OT General Charges $OT Visit: 1 Visit OT Treatments $Self Care/Home Management : 23-37 mins  Lodema Hong, [place]  Office [phone]   Trixie Dredge 08/03/2022, 11:33 AM

## 2022-08-03 NOTE — Progress Notes (Signed)
Consult received. IV team arrived to unit. Patient was up in hall ambulating with PT. Instructed nurse to re-consult IV team when patient had completed PT therapy and back in bed. RN VU. Fran Lowes, RN VAST

## 2022-08-04 DIAGNOSIS — I442 Atrioventricular block, complete: Secondary | ICD-10-CM | POA: Diagnosis not present

## 2022-08-04 LAB — BASIC METABOLIC PANEL
Anion gap: 9 (ref 5–15)
BUN: 6 mg/dL — ABNORMAL LOW (ref 8–23)
CO2: 23 mmol/L (ref 22–32)
Calcium: 8.8 mg/dL — ABNORMAL LOW (ref 8.9–10.3)
Chloride: 101 mmol/L (ref 98–111)
Creatinine, Ser: 1.07 mg/dL (ref 0.61–1.24)
GFR, Estimated: >60 mL/min (ref >60)
Glucose, Bld: 220 mg/dL — ABNORMAL HIGH (ref 70–99)
Potassium: 4.1 mmol/L (ref 3.5–5.1)
Sodium: 133 mmol/L — ABNORMAL LOW (ref 135–145)

## 2022-08-04 LAB — GLUCOSE, CAPILLARY
Glucose-Capillary: 278 mg/dL — ABNORMAL HIGH (ref 70–99)
Glucose-Capillary: 329 mg/dL — ABNORMAL HIGH (ref 70–99)
Glucose-Capillary: 193 mg/dL — ABNORMAL HIGH (ref 70–99)

## 2022-08-04 LAB — MAGNESIUM: Magnesium: 1.8 mg/dL (ref 1.7–2.4)

## 2022-08-04 MED ORDER — AMLODIPINE BESYLATE 10 MG PO TABS: 10.0000 mg | ORAL_TABLET | Freq: Every day | ORAL | 0 refills | Status: DC

## 2022-08-04 MED ORDER — HYDRALAZINE HCL 20 MG/ML IJ SOLN: 10.0000 mg | INTRAMUSCULAR | Status: DC | PRN

## 2022-08-04 MED ORDER — INSULIN NPH ISOPHANE & REGULAR (70-30) 100 UNIT/ML ~~LOC~~ SUSP: 40.0000 [IU] | Freq: Two times a day (BID) | SUBCUTANEOUS | 11 refills | Status: DC

## 2022-08-04 MED ORDER — LISINOPRIL 20 MG PO TABS: 20.0000 mg | ORAL_TABLET | Freq: Every day | ORAL | 0 refills | Status: DC

## 2022-08-04 MED ORDER — LISINOPRIL 20 MG PO TABS
20.0000 mg | ORAL_TABLET | Freq: Every day | ORAL | Status: DC
  Administered 2022-08-04: 20 mg via ORAL
  Filled 2022-08-04: qty 1

## 2022-08-04 NOTE — Progress Notes (Signed)
PT/OT are recommending HH PT/OT. Met with pt to discuss PT recommendations. Pt plans to return home with the support of his grandson who lives with him. He reports that his daughter Sherrie Mustache) will take him home today. He agrees with [facility] therapy. Discussed preference for a [facility] agency. He reports that he has had [place] therapy before but he can't remember the name of the agency. Will contact his daughter to discuss [facility] and he agrees. Per chart review, the referral for [facility] was called to [facility] by his PCP. Contacted Karel Jarvis and she confirmed that her father had [place] services in the past and the agency and they were wonderful and she wants to use them again. Contacted Malachy Mood with [facility] and she accepted the referral.

## 2022-08-04 NOTE — Discharge Summary (Signed)
PatientPhysician Discharge Summary  DREY SHAFF GMW:[id number] DOB: [birth year] DOA: 07/31/2022  PCP: [name], FNP  Admit date: 07/31/2022 Discharge date: 08/04/2022 30 Day Unplanned Readmission Risk Score    Flowsheet Row ED to Hosp-Admission (Current) from 07/31/2022 in [facility] 4E CV SURGICAL PROGRESSIVE CARE  30 Day Unplanned Readmission Risk Score (%) 12.67 Filed at 08/04/2022 0800       This score is the patient's risk of an unplanned readmission within 30 days of being discharged (0 -100%). The score is based on dignosis, age, lab data, medications, orders, and past utilization.   Low:  0-14.9   Medium: 15-21.9   High: 22-29.9   Extreme: 30 and above          Admitted From: Home Disposition: Home  Recommendations for Outpatient Follow-up:  Follow up with PCP in 1-2 weeks Please obtain BMP/CBC in one week Follow-up with neurosurgery in 6 months. Please follow up with your PCP on the following pending results: Unresulted Labs (From admission, onward)     Start     Ordered   08/01/22 0151  Gastrointestinal Panel by PCR , Stool  (Gastrointestinal Panel by PCR, Stool                                                                                                                                                     **Does Not include CLOSTRIDIUM DIFFICILE testing. **If CDIFF testing is needed, place order from the "C Difficile Testing" order set.**)  Once,   R       Question:  Patient immune status  Answer:  Normal   08/01/22 0150   08/01/22 0151  C Difficile Quick Screen w PCR reflex  (C Difficile quick screen w PCR reflex panel )  Once, for 24 hours,   TIMED       References:    CDiff Information Tool   08/01/22 0150              Home Health: Yes Equipment/Devices: None  Discharge Condition: Stable CODE STATUS: Full code Diet recommendation: Cardiac and diabetic  Subjective: Seen and examined.  No complaints.  He wants to go home.  Brief/Interim Summary:  Andre Stout is a 82 y.o. male with medical history significant for diabetes mellitus, and prostate cancer. while at home he had a 'seizure' episode.     The ER patient noted to have a complete heart block.  He also had several electrolyte derangements.  He was hypokalemic with a potassium of 2.7, hypomagnesemic at 1.5.  Admitted under hospital service, cardiology consulted.   CHB in the setting of patient with electrolyte imbalance of hypokalemia and hypomagnesemia: Cardiology and EP has also seen this patient.  TSH normal.  Per EP, the EKG findings were likely due to severe hypokalemia and hypomagnesemia so these were replenished and  patient's EKG improved, patient has remained in sinus rhythm without any complaints of dizziness or chest pain or any other palpitation.  Cardiology cleared him for discharge recommending no intervention and no pacemaker.   Electrolyte imbalance Hypokalemia/Hypomagnesemia Both were replenished.  Normal now.   Syncope and collapse -Suspect related to patient's CHB and electrolyte imbalances -PT/OT recommended home health PT/OT   Extremity edema -Chronic per patient, dependent edema per patient -Most recent echo 1/23 normal EF of 55 to 60%   Diabetes mellitus: Appears to be taking insulin NPH 70-36 units twice daily at home.  -Blood sugar has been running high.  Hemoglobin A1c 8.3.  Increasing his dosage from 36units to 40 units twice daily.   hypertension: Blood pressure has been running high.  Started on amlodipine and lisinopril here and is being discharged on those.   History of prostate cancer: Not an acute issue, follow-up outpatient.   Incidental finding of meningioma: Neurosurgery was consulted-recommended no intervention.  For some reason, they did not document note.  I received phone call from Fenton Malling about these recommendations.  Follow-up in 6 months.    Discharge plan was discussed with patient and/or family member and they verbalized  understanding and agreed with it.  Discharge Diagnoses:  Principal Problem:   Complete heart block ([place]) Active Problems:   Benign essential HTN   Hypomagnesemia   Prostate cancer (HCC)   Syncope and collapse   Diabetes ([place])   Seizure (HCC)   Hypokalemia   Diarrhea    Discharge Instructions   Allergies as of 08/04/2022   No Known Allergies      Medication List     STOP taking these medications    diclofenac 75 MG EC tablet Commonly known as: VOLTAREN   ibuprofen 200 MG tablet Commonly known as: ADVIL       TAKE these medications    amLODipine 10 MG tablet Commonly known as: NORVASC Take 1 tablet (10 mg total) by mouth daily. Start taking on: August 05, 2022   aspirin 81 MG chewable tablet Chew 1 tablet (81 mg total) by mouth daily. What changed: when to take this   BD Insulin Syringe U/F 31G X 5/16" 1 ML Misc Generic drug: Insulin Syringe-Needle U-100 USE 2 DAILY   brimonidine 0.2 % ophthalmic solution Commonly known as: ALPHAGAN Place 1 drop into both eyes 2 (two) times daily with breakfast and lunch.   cetirizine 10 MG tablet Commonly known as: ZYRTEC Take 10 mg by mouth daily as needed (seasonal allergies).   dorzolamide-timolol 22.3-6.8 MG/ML ophthalmic solution Commonly known as: COSOPT Place 1 drop into both eyes 2 (two) times daily with breakfast and lunch.   insulin NPH-regular Human (70-30) 100 UNIT/ML injection Inject 40 Units into the skin 2 (two) times daily with a meal. What changed:  how much to take Another medication with the same name was removed. Continue taking this medication, and follow the directions you see here.   latanoprost 0.005 % ophthalmic solution Commonly known as: XALATAN Place 1 drop into both eyes at bedtime.   lisinopril 20 MG tablet Commonly known as: ZESTRIL Take 1 tablet (20 mg total) by mouth daily. Start taking on: August 05, 2022   magnesium oxide 400 (240 Mg) MG tablet Commonly known as:  MAG-OX Take 400 mg by mouth at bedtime.   OneTouch Delica Lancets 89Q Misc Use to check blood sugar 4 times per day. Dx code: E11.9   OneTouch Ultra test strip Generic drug: glucose blood  USE TO MONITOR GLUCOSE LEVELS 4 TIMES PER DAY E11.9   PreserVision AREDS 2 Caps Take 1 capsule by mouth 2 (two) times daily.        Follow-up Information     [name], FNP Follow up in 1 week(s).   Specialty: Internal Medicine Contact information: [place] [address] [phone]         Geralynn Rile, MD .   Specialties: Cardiology, Internal Medicine, Radiology Contact information: [address] [place] [address] [phone]                No Known Allergies  Consultations: Neurosurgery and cardiology   Procedures/Studies: MR BRAIN W WO CONTRAST  Result Date: 08/01/2022 CLINICAL DATA:  Abnormal CT with possible meningioma or schwannoma EXAM: MRI HEAD WITHOUT AND WITH CONTRAST TECHNIQUE: Multiplanar, multiecho pulse sequences of the brain and surrounding structures were obtained without and with intravenous contrast. CONTRAST:  59m GADAVIST GADOBUTROL 1 MMOL/ML IV SOLN COMPARISON:  None Available. FINDINGS: Motion artifact is present. Brain: There is a dural-based enhancing extra-axial lesion of the right cerebellopontine angle cistern measuring about 1.8 x 1 x 2 cm. There is some extension into the internal auditory canal along the dura. The 7/8 nerve complex is poorly evaluated on these large field-of-view, motion degraded images. No significant mass effect on the adjacent brachium pontis. No parenchymal edema. There is no acute infarction or intracranial hemorrhage. There is no hydrocephalus or extra-axial fluid collection. Prominence of the ventricles and sulci reflects parenchymal volume loss. Patchy and confluent areas of T2 hyperintensity in the supratentorial and pontine white matter are nonspecific may reflect mild to  moderate chronic microvascular ischemic changes. Vascular: Major vessel flow voids at the skull base are preserved. Skull and upper cervical spine: Normal marrow signal is preserved. Sinuses/Orbits: Paranasal sinus mucosal thickening. Bilateral lens replacements. Other: Sella is unremarkable.  Mastoid air cells are clear. 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 No significant mass effect or parenchymal edema. 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 Electronically Signed   By: PMacy MisM.D.   On: 08/01/2022 09:26   CT HEAD WO CONTRAST (5MM)  Result Date: 07/31/2022 CLINICAL DATA:  Possible seizure, memory loss. EXAM: CT HEAD WITHOUT CONTRAST TECHNIQUE: Contiguous axial images were obtained from the base of the skull through the vertex without intravenous contrast. RADIATION DOSE REDUCTION: This exam was performed according to the departmental dose-optimization program which includes automated exposure control, adjustment of the mA and/or kV according to patient size and/or use of iterative reconstruction technique. COMPARISON:  01/02/2022. FINDINGS: Brain: No acute intracranial hemorrhage, midline shift or mass effect. No extra-axial fluid collection. Generalized atrophy is noted. Patchy and confluent white matter hypodensities are noted bilaterally and not significantly changed from the prior exam. A hyperdense structure is noted in the CP angle on the right measuring 1.6 x 0.8 cm. No hydrocephalus. Vascular: Atherosclerotic calcification of the carotid siphons. No hyperdense vessel. Skull: Normal. Negative for fracture or focal lesion. Sinuses/Orbits: Mucosal thickening is noted in the ethmoid air cells. The orbits are within normal limits. Other: Increased density is noted in the scalp over the parietal bone on the right, unchanged from 2021. IMPRESSION: 1. No acute intracranial hemorrhage. 2. Atrophy with chronic microvascular  ischemic changes. 3. Dural-based asymmetry in the CP angle on the right, possible meningioma or schwannoma. MRI is recommended for further characterization on follow-up. Electronically Signed   By: LMickel Baas  Lovena Le M.D.   On: 07/31/2022 21:18   DG Chest Port 1 View  Result Date: 07/31/2022 CLINICAL DATA:  Altered mental status/seizures EXAM: PORTABLE CHEST 1 VIEW COMPARISON:  Radiographs 10/09/2020 FINDINGS: Low lung volumes. Unchanged borderline enlargement of the cardiomediastinal silhouette. Aortic calcifications. Prominent fat pad at the cardiac apex. No focal consolidation, pleural effusion, or pneumothorax. IMPRESSION: No acute abnormality.  Hypoinflation. Electronically Signed   By: Placido Sou M.D.   On: 07/31/2022 20:39     Discharge Exam: Vitals:   08/04/22 0337 08/04/22 0827  BP: (!) 177/91 (!) 157/75  Pulse: 67 68  Resp: 17 14  Temp: 98 F (36.7 C) 98.5 F (36.9 C)  SpO2: 99% 96%   Vitals:   08/03/22 2108 08/03/22 2320 08/04/22 0337 08/04/22 0827  BP: (!) 140/73 (!) 164/77 (!) 177/91 (!) 157/75  Pulse: 63  67 68  Resp: '17 20 17 14  '$ Temp: 97.8 F (36.6 C) 98.1 F (36.7 C) 98 F (36.7 C) 98.5 F (36.9 C)  TempSrc: Oral Oral Oral Oral  SpO2: 98% 99% 99% 96%  Weight:      Height:        General: Pt is alert, awake, not in acute distress Cardiovascular: RRR, S1/S2 +, no rubs, no gallops Respiratory: CTA bilaterally, no wheezing, no rhonchi Abdominal: Soft, NT, ND, bowel sounds + Extremities: +1 pitting edema bilateral lower extremity, no cyanosis    The results of significant diagnostics from this hospitalization (including imaging, microbiology, ancillary and laboratory) are listed below for reference.     Microbiology: No results found for this or any previous visit (from the past 240 hour(s)).   Labs: BNP (last 3 results) Recent Labs    07/31/22 2013  BNP 485.4*   Basic Metabolic Panel: Recent Labs  Lab 08/01/22 0141 08/02/22 0235 08/02/22 2205  08/03/22 1025 08/04/22 0144  NA 135 137 138 135 133*  K 3.2* 3.0* 4.0 4.5 4.1  CL 99 102 105 102 101  CO2 '27 27 26 23 23  '$ GLUCOSE 110* 122* 192* 307* 220*  BUN 6* <5* 6* 5* 6*  CREATININE 1.08 1.15 1.09 1.11 1.07  CALCIUM 8.7* 8.6* 8.6* 8.8* 8.8*  MG 1.9 1.6* 1.9 1.6* 1.8   Liver Function Tests: Recent Labs  Lab 07/31/22 2013  AST 11*  ALT 12  ALKPHOS 77  BILITOT 1.3*  PROT 6.5  ALBUMIN 2.9*   No results for input(s): "LIPASE", "AMYLASE" in the last 168 hours. No results for input(s): "AMMONIA" in the last 168 hours. CBC: Recent Labs  Lab 07/31/22 2013  WBC 8.7  NEUTROABS 4.6  HGB 14.7  HCT 46.0  MCV 85.2  PLT 340   Cardiac Enzymes: No results for input(s): "CKTOTAL", "CKMB", "CKMBINDEX", "TROPONINI" in the last 168 hours. BNP: Invalid input(s): "POCBNP" CBG: Recent Labs  Lab 08/03/22 0616 08/03/22 1005 08/03/22 1619 08/03/22 2107 08/04/22 0602  GLUCAP 244* 269* 263* 207* 278*   D-Dimer No results for input(s): "DDIMER" in the last 72 hours. Hgb A1c Recent Labs    08/02/22 0235  HGBA1C 8.3*   Lipid Profile No results for input(s): "CHOL", "HDL", "LDLCALC", "TRIG", "CHOLHDL", "LDLDIRECT" in the last 72 hours. Thyroid function studies Recent Labs    08/02/22 1438  TSH 4.012   Anemia work up No results for input(s): "VITAMINB12", "FOLATE", "FERRITIN", "TIBC", "IRON", "RETICCTPCT" in the last 72 hours. Urinalysis    Component Value Date/Time   COLORURINE STRAW (A) 08/01/2022 0239   APPEARANCEUR CLEAR 08/01/2022 0239  LABSPEC 1.008 08/01/2022 0239   PHURINE 8.0 08/01/2022 0239   GLUCOSEU NEGATIVE 08/01/2022 0239   HGBUR NEGATIVE 08/01/2022 0239   BILIRUBINUR NEGATIVE 08/01/2022 0239   KETONESUR NEGATIVE 08/01/2022 0239   PROTEINUR 100 (A) 08/01/2022 0239   UROBILINOGEN 1.0 10/04/2009 1109   NITRITE NEGATIVE 08/01/2022 0239   LEUKOCYTESUR NEGATIVE 08/01/2022 0239   Sepsis Labs Recent Labs  Lab 07/31/22 2013  WBC 8.7    Microbiology No results found for this or any previous visit (from the past 240 hour(s)).   Time coordinating discharge: Over 30 minutes  SIGNED:   Darliss Cheney, MD  [facility] 08/04/2022, 10:35 AM *Please note that this is a verbal dictation therefore any spelling or grammatical errors are due to the "[place] [employer]" system interpretation. If 7PM-7AM, please contact night-coverage [email or web address]

## 2022-08-06 ENCOUNTER — Telehealth: Payer: Self-pay

## 2022-08-06 NOTE — Telephone Encounter (Signed)
Transition Care Management Unsuccessful Follow-up Telephone Call  Date of discharge and from where: [facility] 08-04-22 Dx: Complete heart block    Attempts:  1st Attempt  Reason for unsuccessful TCM follow-up call:  Unable to leave message  Transition Care Management Unsuccessful Follow-up Telephone Call  Date of discharge and from where:  [facility] 08-04-22 Dx: Complete heart block   Attempts:  2nd Attempt  Reason for unsuccessful TCM follow-up call:  Unable to leave message  Transition Care Management Unsuccessful Follow-up Telephone Call  Date of discharge and from where: [facility] 08-04-22 Dx: Complete heart block    Attempts:  3rd Attempt  Reason for unsuccessful TCM follow-up call:  Left voice message

## 2022-08-06 NOTE — Progress Notes (Signed)
PCP:  [name], FNP Primary Cardiologist: Evalina Field, MD Electrophysiologist: Cristopher Peru, MD   Andre Stout is a 82 y.o. male seen today for Cristopher Peru, MD for post hospital follow up.   Admitted 8/1 - 8/5  with syncopal event.   Noted to have a h/o 2 prior syncopal events with unclear details. Pt noted to have intermittent CHB with narrow escape in 50s. Also noted to have profound hypokalemia and hypomagnesemia.  He was monitored and determined to have stable rhythm and not require pacing urgently.   Since hospital discharge he has been doing well. Son is present. Pt has intermittent dizziness and lightheadednessu, but no clear aggravating or relieving symptoms. HE denies syncope or DOE.   Past Medical History:  Diagnosis Date   Diabetes mellitus without complication ([place])    Glaucoma    Prostate cancer ([place])    Been 3-4 years ago   Past Surgical History:  Procedure Laterality Date   CATARACT EXTRACTION Bilateral     Current Outpatient Medications  Medication Sig Dispense Refill   amLODipine (NORVASC) 10 MG tablet Take 1 tablet (10 mg total) by mouth daily. 30 tablet 0   aspirin 81 MG chewable tablet Chew 1 tablet (81 mg total) by mouth daily. (Patient taking differently: Chew 81 mg by mouth at bedtime.) 30 tablet 0   BD INSULIN SYRINGE U/F 31G X 5/16" 1 ML MISC USE 2 DAILY 200 each 4   brimonidine (ALPHAGAN) 0.2 % ophthalmic solution Place 1 drop into both eyes 2 (two) times daily with breakfast and lunch.     cetirizine (ZYRTEC) 10 MG tablet Take 10 mg by mouth daily as needed (seasonal allergies).     dorzolamide-timolol (COSOPT) 22.3-6.8 MG/ML ophthalmic solution Place 1 drop into both eyes 2 (two) times daily with breakfast and lunch.     insulin NPH-regular Human (70-30) 100 UNIT/ML injection Inject 40 Units into the skin 2 (two) times daily with a meal. 10 mL 11   latanoprost (XALATAN) 0.005 % ophthalmic solution Place 1 drop into both eyes at  bedtime.     lisinopril (ZESTRIL) 20 MG tablet Take 1 tablet (20 mg total) by mouth daily. 30 tablet 0   magnesium oxide (MAG-OX) 400 (240 Mg) MG tablet Take 400 mg by mouth at bedtime.     Multiple Vitamins-Minerals (PRESERVISION AREDS 2) CAPS Take 1 capsule by mouth 2 (two) times daily.     ONETOUCH DELICA LANCETS 18E MISC Use to check blood sugar 4 times per day. Dx code: E11.9 200 each 2   ONETOUCH ULTRA test strip USE TO MONITOR GLUCOSE LEVELS 4 TIMES PER DAY E11.9 100 each 2   No current facility-administered medications for this visit.    No Known Allergies  Social History   Socioeconomic History   Marital status: Widowed    Spouse name: Not on file   Number of children: 1   Years of education: 15   Highest education level: Not on file  Occupational History   Occupation: Retired  Tobacco Use   Smoking status: Former    Packs/day: 0.50    Years: 4.00    Total pack years: 2.00    Types: Cigarettes    Quit date: 10/29/1974    Years since quitting: 47.8   Smokeless tobacco: Never  Vaping Use   Vaping Use: Never used  Substance and Sexual Activity   Alcohol use: No   Drug use: No   Sexual activity: Not Currently  Other Topics Concern   Not on file  Social History Narrative   Born and raised in [place], [place]. Currently reside in a private residence by himself. Daughter lives in the area. No live. Fun: hunt and fish   Denies religious beliefs that would effect health care.    Social Determinants of Health   Financial Resource Strain: Low Risk  (03/26/2022)   Overall Financial Resource Strain (CARDIA)    Difficulty of Paying Living Expenses: Not hard at all  Food Insecurity: No Food Insecurity (03/26/2022)   Hunger Vital Sign    Worried About Running Out of Food in the Last Year: Never true    Ran Out of Food in the Last Year: Never true  Transportation Needs: No Transportation Needs (03/26/2022)   PRAPARE - Hydrologist (Medical): No     Lack of Transportation (Non-Medical): No  Physical Activity: Inactive (03/26/2022)   Exercise Vital Sign    Days of Exercise per Week: 0 days    Minutes of Exercise per Session: 0 min  Stress: No Stress Concern Present (03/26/2022)   [place]    Feeling of Stress : Not at all  Social Connections: Unknown (05/28/2019)   Social Connection and Isolation Panel [NHANES]    Frequency of Communication with Friends and Family: More than three times a week    Frequency of Social Gatherings with Friends and Family: More than three times a week    Attends Religious Services: Not on file    Active Member of Clubs or Organizations: Yes    Attends Archivist Meetings: More than 4 times per year    Marital Status: Widowed  Intimate Partner Violence: Not At Risk (03/26/2022)   Humiliation, Afraid, Rape, and Kick questionnaire    Fear of Current or Ex-Partner: No    Emotionally Abused: No    Physically Abused: No    Sexually Abused: No     Review of Systems: All other systems reviewed and are otherwise negative except as noted above.  Physical Exam: Vitals:   08/13/22 1201  BP: (!) 124/54  Pulse: 73  SpO2: 97%  Weight: 195 lb (88.5 kg)  Height: '5\' 5"'$  (1.651 m)    GEN- The patient is well appearing, alert and oriented x 3 today.   HEENT: normocephalic, atraumatic; sclera clear, conjunctiva pink; hearing intact; oropharynx clear; neck supple, no JVP Lymph- no cervical lymphadenopathy Lungs- Clear to ausculation bilaterally, normal work of breathing.  No wheezes, rales, rhonchi Heart- Regular rate and rhythm, no murmurs, rubs or gallops, PMI not laterally displaced GI- soft, non-tender, non-distended, bowel sounds present, no hepatosplenomegaly Extremities- no clubbing, cyanosis, or edema; DP/PT/radial pulses 2+ bilaterally MS- no significant deformity or atrophy Skin- warm and dry, no rash or lesion Psych- euthymic  mood, full affect Neuro- strength and sensation are intact  EKG is ordered. Personal review of EKG from today shows CHB with narrow escape at 72 bpm  Additional studies reviewed include: Previous EP office notes.   Assessment and Plan:  CHB/isorhythmic dissociation. No bradycardia, though reports of dizziness/lightheadedness at home.  Narrow baseline QRS With prior monitor, he was In sinus without any AV dissociation.  Will have him wear a 2 week monitor to see if symptoms correlate with any HR dysfunction.    Follow up with Dr. Lovena Le in 3 months. Sooner if monitor remarkable.  Shirley Friar, PA-C  08/13/22 12:17 PM

## 2022-08-13 ENCOUNTER — Encounter: Payer: Self-pay | Admitting: Student

## 2022-08-13 ENCOUNTER — Ambulatory Visit: Payer: Medicare Other

## 2022-08-13 ENCOUNTER — Ambulatory Visit (INDEPENDENT_AMBULATORY_CARE_PROVIDER_SITE_OTHER): Payer: Medicare Other | Admitting: Student

## 2022-08-13 VITALS — BP 124/54 | HR 73 | Ht 65.0 in | Wt 195.0 lb

## 2022-08-13 DIAGNOSIS — R9431 Abnormal electrocardiogram [ECG] [EKG]: Secondary | ICD-10-CM | POA: Diagnosis not present

## 2022-08-13 DIAGNOSIS — R55 Syncope and collapse: Secondary | ICD-10-CM

## 2022-08-13 DIAGNOSIS — E876 Hypokalemia: Secondary | ICD-10-CM | POA: Diagnosis not present

## 2022-08-13 DIAGNOSIS — I442 Atrioventricular block, complete: Secondary | ICD-10-CM

## 2022-08-13 NOTE — Patient Instructions (Signed)
Medication Instructions:  Your physician recommends that you continue on your current medications as directed. Please refer to the Current Medication list given to you today.  *If you need a refill on your cardiac medications before your next appointment, please call your pharmacy*   Lab Work: None If you have labs (blood work) drawn today and your tests are completely normal, you will receive your results only by: [place] (if you have MyChart) OR A paper copy in the mail If you have any lab test that is abnormal or we need to change your treatment, we will call you to review the results.   Follow-Up: At [facility], you and your health needs are our priority.  As part of our continuing mission to provide you with exceptional heart care, we have created designated Provider Care Teams.  These Care Teams include your primary Cardiologist (physician) and Advanced Practice Providers (APPs -  Physician Assistants and Nurse Practitioners) who all work together to provide you with the care you need, when you need it.   Your next appointment:   3 month(s)  The format for your next appointment:   In Person  Provider:   Cristopher Peru, MD}    Other Instructions  ZIO XT- Long Term Monitor Instructions  Your physician has requested you wear a ZIO patch monitor for 14 days.  This is a single patch monitor. [employer] supplies one patch monitor per enrollment. Additional stickers are not available. Please do not apply patch if you will be having a Nuclear Stress Test,  Echocardiogram, Cardiac CT, MRI, or Chest Xray during the period you would be wearing the  monitor. The patch cannot be worn during these tests. You cannot remove and re-apply the  ZIO XT patch monitor.  Your ZIO patch monitor will be mailed 3 day USPS to your address on file. It may take 3-5 days  to receive your monitor after you have been enrolled.  Once you have received your monitor, please review the enclosed  instructions. Your monitor  has already been registered assigning a specific monitor serial # to you.  Billing and Patient Assistance Program Information  We have supplied [employer] with any of your insurance information on file for billing purposes. [employer] offers a sliding scale Patient Assistance Program for patients that do not have  insurance, or whose insurance does not completely cover the cost of the ZIO monitor.  You must apply for the Patient Assistance Program to qualify for this discounted rate.  To apply, please call [employer] at [phone], select option 4, select option 2, ask to apply for  Patient Assistance Program. Theodore Demark will ask your household income, and how many people  are in your household. They will quote your out-of-pocket cost based on that information.  [employer] will also be able to set up a 38-month interest-free payment plan if needed.  Applying the monitor   Shave hair from upper left chest.  Hold abrader disc by orange tab. Rub abrader in 40 strokes over the upper left chest as  indicated in your monitor instructions.  Clean area with 4 enclosed alcohol pads. Let dry.  Apply patch as indicated in monitor instructions. Patch will be placed under collarbone on left  side of chest with arrow pointing upward.  Rub patch adhesive wings for 2 minutes. Remove white label marked "1". Remove the white  label marked "2". Rub patch adhesive wings for 2 additional minutes.  While looking in a mirror, press and release button in  center of patch. A small green light will  flash 3-4 times. This will be your only indicator that the monitor has been turned on.  Do not shower for the first 24 hours. You may shower after the first 24 hours.  Press the button if you feel a symptom. You will hear a small click. Record Date, Time and  Symptom in the Patient Logbook.  When you are ready to remove the patch, follow instructions on the last 2 pages of Patient  Logbook. Stick patch  monitor onto the last page of Patient Logbook.  Place Patient Logbook in the blue and white box. Use locking tab on box and tape box closed  securely. The blue and white box has prepaid postage on it. Please place it in the mailbox as  soon as possible. Your physician should have your test results approximately 7 days after the  monitor has been mailed back to [facility].  Call [place] at [phone] if you have questions regarding  your ZIO XT patch monitor. Call them immediately if you see an orange light blinking on your  monitor.  If your monitor falls off in less than 4 days, contact our Monitor department at [phone].  If your monitor becomes loose or falls off after 4 days call [employer] at [phone] for  suggestions on securing your monitor

## 2022-08-13 NOTE — Progress Notes (Unsigned)
Enrolled for [employer] to mail a ZIO XT long term holter monitor to the patients address on file.  °Dr. Taylor to read. °

## 2022-08-14 ENCOUNTER — Emergency Department (HOSPITAL_COMMUNITY): Payer: Medicare Other

## 2022-08-14 ENCOUNTER — Inpatient Hospital Stay: Payer: Medicare Other | Admitting: Family

## 2022-08-14 ENCOUNTER — Encounter (HOSPITAL_COMMUNITY): Payer: Self-pay | Admitting: *Deleted

## 2022-08-14 ENCOUNTER — Inpatient Hospital Stay (HOSPITAL_COMMUNITY)
Admission: EM | Admit: 2022-08-14 | Discharge: 2022-08-28 | DRG: 445 | Disposition: A | Discharge status: Home Health | Payer: Medicare Other | Attending: Internal Medicine | Admitting: Internal Medicine

## 2022-08-14 ENCOUNTER — Other Ambulatory Visit: Payer: Self-pay

## 2022-08-14 DIAGNOSIS — E119 Type 2 diabetes mellitus without complications: Secondary | ICD-10-CM | POA: Diagnosis not present

## 2022-08-14 DIAGNOSIS — E11649 Type 2 diabetes mellitus with hypoglycemia without coma: Secondary | ICD-10-CM | POA: Diagnosis not present

## 2022-08-14 DIAGNOSIS — Z8546 Personal history of malignant neoplasm of prostate: Secondary | ICD-10-CM | POA: Diagnosis not present

## 2022-08-14 DIAGNOSIS — E876 Hypokalemia: Secondary | ICD-10-CM

## 2022-08-14 DIAGNOSIS — E669 Obesity, unspecified: Secondary | ICD-10-CM | POA: Diagnosis present

## 2022-08-14 DIAGNOSIS — E871 Hypo-osmolality and hyponatremia: Secondary | ICD-10-CM

## 2022-08-14 DIAGNOSIS — E1110 Type 2 diabetes mellitus with ketoacidosis without coma: Secondary | ICD-10-CM | POA: Diagnosis not present

## 2022-08-14 DIAGNOSIS — N179 Acute kidney failure, unspecified: Secondary | ICD-10-CM | POA: Diagnosis present

## 2022-08-14 DIAGNOSIS — K8001 Calculus of gallbladder with acute cholecystitis with obstruction: Principal | ICD-10-CM | POA: Diagnosis present

## 2022-08-14 DIAGNOSIS — R1310 Dysphagia, unspecified: Secondary | ICD-10-CM

## 2022-08-14 DIAGNOSIS — Z950 Presence of cardiac pacemaker: Secondary | ICD-10-CM | POA: Diagnosis not present

## 2022-08-14 DIAGNOSIS — Z7982 Long term (current) use of aspirin: Secondary | ICD-10-CM | POA: Diagnosis not present

## 2022-08-14 DIAGNOSIS — K828 Other specified diseases of gallbladder: Secondary | ICD-10-CM | POA: Diagnosis present

## 2022-08-14 DIAGNOSIS — K5900 Constipation, unspecified: Secondary | ICD-10-CM | POA: Diagnosis present

## 2022-08-14 DIAGNOSIS — M25562 Pain in left knee: Secondary | ICD-10-CM | POA: Diagnosis not present

## 2022-08-14 DIAGNOSIS — K819 Cholecystitis, unspecified: Secondary | ICD-10-CM | POA: Diagnosis not present

## 2022-08-14 DIAGNOSIS — R112 Nausea with vomiting, unspecified: Secondary | ICD-10-CM | POA: Diagnosis present

## 2022-08-14 DIAGNOSIS — I5A Non-ischemic myocardial injury (non-traumatic): Secondary | ICD-10-CM

## 2022-08-14 DIAGNOSIS — Z87891 Personal history of nicotine dependence: Secondary | ICD-10-CM

## 2022-08-14 DIAGNOSIS — I471 Supraventricular tachycardia: Secondary | ICD-10-CM | POA: Diagnosis present

## 2022-08-14 DIAGNOSIS — Z6833 Body mass index (BMI) 33.0-33.9, adult: Secondary | ICD-10-CM

## 2022-08-14 DIAGNOSIS — E1165 Type 2 diabetes mellitus with hyperglycemia: Secondary | ICD-10-CM | POA: Diagnosis not present

## 2022-08-14 DIAGNOSIS — R54 Age-related physical debility: Secondary | ICD-10-CM | POA: Diagnosis present

## 2022-08-14 DIAGNOSIS — Z794 Long term (current) use of insulin: Secondary | ICD-10-CM

## 2022-08-14 DIAGNOSIS — R109 Unspecified abdominal pain: Principal | ICD-10-CM

## 2022-08-14 DIAGNOSIS — I442 Atrioventricular block, complete: Secondary | ICD-10-CM | POA: Diagnosis present

## 2022-08-14 DIAGNOSIS — E861 Hypovolemia: Secondary | ICD-10-CM | POA: Diagnosis present

## 2022-08-14 DIAGNOSIS — I10 Essential (primary) hypertension: Secondary | ICD-10-CM | POA: Diagnosis present

## 2022-08-14 DIAGNOSIS — K810 Acute cholecystitis: Secondary | ICD-10-CM

## 2022-08-14 DIAGNOSIS — Z833 Family history of diabetes mellitus: Secondary | ICD-10-CM

## 2022-08-14 DIAGNOSIS — Z79899 Other long term (current) drug therapy: Secondary | ICD-10-CM

## 2022-08-14 HISTORY — DX: Conduction disorder, unspecified: I45.9

## 2022-08-14 HISTORY — DX: Essential (primary) hypertension: I10

## 2022-08-14 LAB — GLUCOSE, CAPILLARY
Glucose-Capillary: 478 mg/dL — ABNORMAL HIGH (ref 70–99)
Glucose-Capillary: 305 mg/dL — ABNORMAL HIGH (ref 70–99)

## 2022-08-14 LAB — CBC WITH DIFFERENTIAL/PLATELET
Abs Immature Granulocytes: 0.13 10*3/uL — ABNORMAL HIGH (ref 0.00–0.07)
Basophils Absolute: 0.1 10*3/uL (ref 0.0–0.1)
Basophils Relative: 0 %
Eosinophils Absolute: 0 10*3/uL (ref 0.0–0.5)
Eosinophils Relative: 0 %
HCT: 45.8 % (ref 39.0–52.0)
Hemoglobin: 15 g/dL (ref 13.0–17.0)
Immature Granulocytes: 1 %
Lymphocytes Relative: 10 %
Lymphs Abs: 2 10*3/uL (ref 0.7–4.0)
MCH: 27.4 pg (ref 26.0–34.0)
MCHC: 32.8 g/dL (ref 30.0–36.0)
MCV: 83.7 fL (ref 80.0–100.0)
Monocytes Absolute: 1.5 10*3/uL — ABNORMAL HIGH (ref 0.1–1.0)
Monocytes Relative: 8 %
Neutro Abs: 16.1 10*3/uL — ABNORMAL HIGH (ref 1.7–7.7)
Neutrophils Relative %: 81 %
Platelets: 334 10*3/uL (ref 150–400)
RBC: 5.47 MIL/uL (ref 4.22–5.81)
RDW: 14.2 % (ref 11.5–15.5)
WBC: 19.9 10*3/uL — ABNORMAL HIGH (ref 4.0–10.5)
nRBC: 0 % (ref 0.0–0.2)

## 2022-08-14 LAB — CBG MONITORING, ED
Glucose-Capillary: 105 mg/dL — ABNORMAL HIGH (ref 70–99)
Glucose-Capillary: 241 mg/dL — ABNORMAL HIGH (ref 70–99)
Glucose-Capillary: 377 mg/dL — ABNORMAL HIGH (ref 70–99)

## 2022-08-14 LAB — URINALYSIS, ROUTINE W REFLEX MICROSCOPIC
Bacteria, UA: NONE SEEN
Bilirubin Urine: NEGATIVE
Glucose, UA: >=500 mg/dL — AB
Hgb urine dipstick: NEGATIVE
Ketones, ur: 5 mg/dL — AB
Leukocytes,Ua: NEGATIVE
Nitrite: NEGATIVE
Protein, ur: >=300 mg/dL — AB
Specific Gravity, Urine: 1.036 — ABNORMAL HIGH (ref 1.005–1.030)
pH: 7 (ref 5.0–8.0)

## 2022-08-14 LAB — APTT: aPTT: 31 seconds (ref 24–36)

## 2022-08-14 LAB — TYPE AND SCREEN
ABO/RH(D): A NEG
Antibody Screen: NEGATIVE

## 2022-08-14 LAB — COMPREHENSIVE METABOLIC PANEL
ALT: 11 U/L (ref 0–44)
AST: 16 U/L (ref 15–41)
Albumin: 3.1 g/dL — ABNORMAL LOW (ref 3.5–5.0)
Alkaline Phosphatase: 87 U/L (ref 38–126)
Anion gap: 10 (ref 5–15)
BUN: 11 mg/dL (ref 8–23)
CO2: 25 mmol/L (ref 22–32)
Calcium: 9.3 mg/dL (ref 8.9–10.3)
Chloride: 102 mmol/L (ref 98–111)
Creatinine, Ser: 1.07 mg/dL (ref 0.61–1.24)
GFR, Estimated: >60 mL/min (ref >60)
Glucose, Bld: 116 mg/dL — ABNORMAL HIGH (ref 70–99)
Potassium: 3.8 mmol/L (ref 3.5–5.1)
Sodium: 137 mmol/L (ref 135–145)
Total Bilirubin: 1.6 mg/dL — ABNORMAL HIGH (ref 0.3–1.2)
Total Protein: 7.4 g/dL (ref 6.5–8.1)

## 2022-08-14 LAB — TROPONIN I (HIGH SENSITIVITY)
Troponin I (High Sensitivity): 26 ng/L — ABNORMAL HIGH (ref <18)
Troponin I (High Sensitivity): 23 ng/L — ABNORMAL HIGH (ref <18)

## 2022-08-14 LAB — MAGNESIUM: Magnesium: 1.8 mg/dL (ref 1.7–2.4)

## 2022-08-14 LAB — PHOSPHORUS: Phosphorus: 3 mg/dL (ref 2.5–4.6)

## 2022-08-14 LAB — ABO/RH: ABO/RH(D): A NEG

## 2022-08-14 LAB — PROTIME-INR
INR: 1.3 — ABNORMAL HIGH (ref 0.8–1.2)
Prothrombin Time: 16 seconds — ABNORMAL HIGH (ref 11.4–15.2)

## 2022-08-14 MED ORDER — PIPERACILLIN-TAZOBACTAM 3.375 G IVPB
3.3750 g | Freq: Once | INTRAVENOUS | Status: AC
Start: 2022-08-14 — End: 2022-08-14
  Administered 2022-08-14: 3.375 g via INTRAVENOUS
  Filled 2022-08-14: qty 50

## 2022-08-14 MED ORDER — ASPIRIN 81 MG PO CHEW
81.0000 mg | CHEWABLE_TABLET | Freq: Every day | ORAL | Status: DC
  Administered 2022-08-14 – 2022-08-27 (×13): 81 mg via ORAL
  Filled 2022-08-14 (×13): qty 1

## 2022-08-14 MED ORDER — ENOXAPARIN SODIUM 40 MG/0.4ML IJ SOSY
40.0000 mg | PREFILLED_SYRINGE | INTRAMUSCULAR | Status: DC
  Administered 2022-08-14 – 2022-08-17 (×4): 40 mg via SUBCUTANEOUS
  Filled 2022-08-14 (×4): qty 0.4

## 2022-08-14 MED ORDER — ACETAMINOPHEN 500 MG PO TABS: 1000.0000 mg | ORAL_TABLET | Freq: Every day | ORAL | Status: DC | PRN

## 2022-08-14 MED ORDER — HYDROMORPHONE HCL 1 MG/ML IJ SOLN: 0.5000 mg | INTRAMUSCULAR | Status: DC | PRN

## 2022-08-14 MED ORDER — ONDANSETRON HCL 4 MG/2ML IJ SOLN
4.0000 mg | Freq: Once | INTRAMUSCULAR | Status: AC
  Administered 2022-08-14: 4 mg via INTRAVENOUS
  Filled 2022-08-14: qty 2

## 2022-08-14 MED ORDER — ONDANSETRON HCL 4 MG/2ML IJ SOLN
4.0000 mg | Freq: Four times a day (QID) | INTRAMUSCULAR | Status: DC | PRN
  Administered 2022-08-16 – 2022-08-18 (×7): 4 mg via INTRAVENOUS
  Filled 2022-08-14 (×7): qty 2

## 2022-08-14 MED ORDER — INSULIN ASPART 100 UNIT/ML IJ SOLN
0.0000 [IU] | Freq: Three times a day (TID) | INTRAMUSCULAR | Status: DC
  Administered 2022-08-14: 15 [IU] via SUBCUTANEOUS
  Administered 2022-08-16 (×2): 3 [IU] via SUBCUTANEOUS
  Administered 2022-08-17 (×2): 8 [IU] via SUBCUTANEOUS
  Administered 2022-08-17: 5 [IU] via SUBCUTANEOUS
  Administered 2022-08-18: 15 [IU] via SUBCUTANEOUS

## 2022-08-14 MED ORDER — MAGNESIUM SULFATE 2 GM/50ML IV SOLN
2.0000 g | Freq: Once | INTRAVENOUS | Status: AC
  Administered 2022-08-14: 2 g via INTRAVENOUS
  Filled 2022-08-14: qty 50

## 2022-08-14 MED ORDER — LATANOPROST 0.005 % OP SOLN
1.0000 [drp] | Freq: Every day | OPHTHALMIC | Status: DC
Start: 2022-08-14 — End: 2022-08-28
  Administered 2022-08-14 – 2022-08-27 (×14): 1 [drp] via OPHTHALMIC
  Filled 2022-08-14 (×2): qty 2.5

## 2022-08-14 MED ORDER — INSULIN GLARGINE-YFGN 100 UNIT/ML ~~LOC~~ SOLN: 40.0000 [IU] | Freq: Every day | SUBCUTANEOUS | Status: DC

## 2022-08-14 MED ORDER — BRIMONIDINE TARTRATE 0.2 % OP SOLN
1.0000 [drp] | Freq: Two times a day (BID) | OPHTHALMIC | Status: DC
Start: 2022-08-15 — End: 2022-08-28
  Administered 2022-08-15 – 2022-08-28 (×26): 1 [drp] via OPHTHALMIC
  Filled 2022-08-14 (×2): qty 5

## 2022-08-14 MED ORDER — LACTATED RINGERS BOLUS PEDS
1000.0000 mL | Freq: Once | INTRAVENOUS | Status: AC
  Administered 2022-08-14: 1000 mL via INTRAVENOUS

## 2022-08-14 MED ORDER — DORZOLAMIDE HCL-TIMOLOL MAL 2-0.5 % OP SOLN
1.0000 [drp] | Freq: Two times a day (BID) | OPHTHALMIC | Status: DC
  Administered 2022-08-15 – 2022-08-28 (×26): 1 [drp] via OPHTHALMIC
  Filled 2022-08-14 (×2): qty 10

## 2022-08-14 MED ORDER — SODIUM CHLORIDE 0.9 % IV SOLN
INTRAVENOUS | Status: AC
Start: 2022-08-14 — End: 2022-08-15

## 2022-08-14 MED ORDER — PIPERACILLIN-TAZOBACTAM 3.375 G IVPB
3.3750 g | Freq: Three times a day (TID) | INTRAVENOUS | Status: DC
  Administered 2022-08-14 – 2022-08-20 (×17): 3.375 g via INTRAVENOUS
  Filled 2022-08-14 (×18): qty 50

## 2022-08-14 MED ORDER — INSULIN GLARGINE-YFGN 100 UNIT/ML ~~LOC~~ SOLN
40.0000 [IU] | Freq: Every day | SUBCUTANEOUS | Status: DC
  Administered 2022-08-14: 40 [IU] via SUBCUTANEOUS
  Filled 2022-08-14 (×3): qty 0.4

## 2022-08-14 MED ORDER — IOHEXOL 300 MG/ML  SOLN
100.0000 mL | Freq: Once | INTRAMUSCULAR | Status: AC | PRN
  Administered 2022-08-14: 100 mL via INTRAVENOUS

## 2022-08-14 MED ORDER — HYDRALAZINE HCL 20 MG/ML IJ SOLN
5.0000 mg | Freq: Four times a day (QID) | INTRAMUSCULAR | Status: DC | PRN
Start: 2022-08-14 — End: 2022-08-21
  Administered 2022-08-16 – 2022-08-17 (×3): 5 mg via INTRAVENOUS
  Filled 2022-08-14 (×2): qty 1

## 2022-08-14 NOTE — ED Notes (Signed)
Patient requesting cbg check

## 2022-08-14 NOTE — ED Provider Triage Note (Signed)
  Emergency Medicine Provider Triage Evaluation Note  MRN:  [id number]  Arrival date & time: 08/14/22    Medically screening exam initiated at 1:19 AM.   CC:   Abdominal Pain   HPI:  Andre Stout is a 82 y.o. year-old male presents to the ED with chief complaint of abdominal pain and constipation.  No BM this for several days.  Has been confused.    History provided by Grandson ROS:  -As included in HPI PE:   Vitals:   08/14/22 0100  BP: (!) 190/79  Pulse: 74  Resp: 18  Temp: 99.6 F (37.6 C)  SpO2: 97%    Non-toxic appearing No respiratory distress  MDM:    Patient was informed that the remainder of the evaluation will be completed by another provider, this initial triage assessment does not replace that evaluation, and the importance of remaining in the ED until their evaluation is complete.    Montine Circle, PA-C 08/14/22 0119

## 2022-08-14 NOTE — H&P (Signed)
History and Physical    Andre Stout IWP:[id number] DOB: 05/30/1940 DOA: 08/14/2022  PCP: [name], FNP (Confirm with patient/family/NH records and if not entered, this has to be entered at [facility] point of entry) Patient coming from: [place]  I have personally briefly reviewed patient's old medical records in [place]  Chief Complaint: RUQ pain  HPI: Andre Stout is a 82 y.o. 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  Patient started to have nauseous vomiting and epigastric discomfort 3 days ago after eating pizza for dinner.  Vomiting of stomach content nonbilious nonbloody this morning then started to feel lightheadedness, episode of chills no fever.  This morning, patient woke up with worsening of feeling nausea and continued to have watery vomitus and also started to have loose diarrhea.  Denies any lightheadedness chest pain or shortness of breath. ED Course: Low-grade fever 99.6, no tachycardia no hypotension nonhypoxic.  CT abdomen pelvis suspicious for acute cholecystitis, confirmed with RUQ ultrasound.  WBC 19, total bili 1.6, AST 16 ALT 11.  EKG nonischemic, troponin pending.  Review of Systems: As per HPI otherwise 14 point review of systems negative.   Past Medical History:  Diagnosis Date   Diabetes mellitus without complication ([place])    Glaucoma    Heart block    complete heart block   Hypertension    Prostate cancer ([place])    Been 3-4 years ago    Past Surgical History:  Procedure Laterality Date   CATARACT EXTRACTION Bilateral    INSERTION PROSTATE RADIATION SEED       reports that he quit smoking about 47 years ago. His smoking use included cigarettes. He has a 2.00 pack-year smoking history. He has never used smokeless tobacco. He reports that he does not drink alcohol and does not use drugs.  No Known  Allergies  Family History  Problem Relation Age of Onset   Diabetes Father    Diabetes Paternal Grandfather     Prior to Admission medications   Medication Sig Start Date End Date Taking? Authorizing Provider  acetaminophen (TYLENOL) 500 MG tablet Take 1,000 mg by mouth daily as needed (pain).   Yes [provider]  aspirin 81 MG chewable tablet Chew 1 tablet (81 mg total) by mouth daily. Patient taking differently: Chew 81 mg by mouth at bedtime. 01/06/22  Yes Swayze, Ava, DO  brimonidine (ALPHAGAN) 0.2 % ophthalmic solution Place 1 drop into both eyes 2 (two) times daily with breakfast and lunch. 12/18/21  Yes [provider]  cetirizine (ZYRTEC) 10 MG tablet Take 10 mg by mouth daily as needed (seasonal allergies).   Yes [provider]  insulin NPH-regular Human (70-30) 100 UNIT/ML injection Inject 40 Units into the skin 2 (two) times daily with a meal. Patient taking differently: Inject 36 Units into the skin 2 (two) times daily with a meal. 08/04/22  Yes Pahwani, Ravi, MD  latanoprost (XALATAN) 0.005 % ophthalmic solution Place 1 drop into both eyes at bedtime. 01/01/22  Yes [provider]  magnesium oxide (MAG-OX) 400 (240 Mg) MG tablet Take 400 mg by mouth at bedtime. 06/06/22  Yes [provider]  Multiple Vitamins-Minerals (PRESERVISION AREDS 2) CAPS Take 1 capsule by mouth at bedtime.   Yes [provider]  amLODipine (NORVASC) 10 MG tablet Take 1 tablet (10 mg total) by mouth daily. Patient not taking:  Reported on 08/14/2022 08/05/22 09/04/22  Darliss Cheney, MD  BD INSULIN SYRINGE U/F 31G X 5/16" 1 ML MISC USE 2 DAILY 08/24/21   Renato Shin, MD  dorzolamide-timolol (COSOPT) 22.3-6.8 MG/ML ophthalmic solution Place 1 drop into both eyes 2 (two) times daily with breakfast and lunch. 05/18/22   [provider]  lisinopril (ZESTRIL) 20 MG tablet Take 1 tablet (20 mg total) by mouth daily. Patient not taking: Reported on 08/14/2022  08/05/22 09/04/22  Darliss Cheney, MD  [facility] DELICA LANCETS 16X MISC Use to check blood sugar 4 times per day. Dx code: E11.9 03/27/16   Renato Shin, MD  [facility] ULTRA test strip USE TO MONITOR GLUCOSE LEVELS 4 TIMES PER DAY E11.9 06/10/19   Renato Shin, MD    Physical Exam: Vitals:   08/14/22 1345 08/14/22 1430 08/14/22 1515 08/14/22 1539  BP: (!) 144/64 (!) 157/99 (!) 171/73   Pulse: 61 65 63   Resp: '17 19 20   '$ Temp:    98.1 F (36.7 C)  TempSrc:    Oral  SpO2: 93% 95% 94%     Constitutional: NAD, calm, comfortable Vitals:   08/14/22 1345 08/14/22 1430 08/14/22 1515 08/14/22 1539  BP: (!) 144/64 (!) 157/99 (!) 171/73   Pulse: 61 65 63   Resp: '17 19 20   '$ Temp:    98.1 F (36.7 C)  TempSrc:    Oral  SpO2: 93% 95% 94%    Eyes: PERRL, lids and conjunctivae normal ENMT: Mucous membranes are moist. Posterior pharynx clear of any exudate or lesions.Normal dentition.  Neck: normal, supple, no masses, no thyromegaly Respiratory: clear to auscultation bilaterally, no wheezing, no crackles. Normal respiratory effort. No accessory muscle use.  Cardiovascular: Regular rate and rhythm, no murmurs / rubs / gallops. No extremity edema. 2+ pedal pulses. No carotid bruits.  Abdomen: Positive RUQ tenderness no rebound no guarding, no masses palpated. No hepatosplenomegaly. Bowel sounds positive.  Musculoskeletal: no clubbing / cyanosis. No joint deformity upper and lower extremities. Good ROM, no contractures. Normal muscle tone.  Skin: no rashes, lesions, ulcers. No induration Neurologic: CN 2-12 grossly intact. Sensation intact, DTR normal. Strength 5/5 in all 4.  Psychiatric: Normal judgment and insight. Alert and oriented x 3. Normal mood.   Labs on Admission: I have personally reviewed following labs and imaging studies  CBC: Recent Labs  Lab 08/14/22 0122  WBC 19.9*  NEUTROABS 16.1*  HGB 15.0  HCT 45.8  MCV 83.7  PLT 096   Basic Metabolic Panel: Recent Labs  Lab  08/14/22 0122  NA 137  K 3.8  CL 102  CO2 25  GLUCOSE 116*  BUN 11  CREATININE 1.07  CALCIUM 9.3   GFR: Estimated Creatinine Clearance: 55.4 mL/min (by C-G formula based on SCr of 1.07 mg/dL). Liver Function Tests: Recent Labs  Lab 08/14/22 0122  AST 16  ALT 11  ALKPHOS 87  BILITOT 1.6*  PROT 7.4  ALBUMIN 3.1*   No results for input(s): "LIPASE", "AMYLASE" in the last 168 hours. No results for input(s): "AMMONIA" in the last 168 hours. Coagulation Profile: Recent Labs  Lab 08/14/22 1200  INR 1.3*   Cardiac Enzymes: No results for input(s): "CKTOTAL", "CKMB", "CKMBINDEX", "TROPONINI" in the last 168 hours. BNP (last 3 results) No results for input(s): "PROBNP" in the last 8760 hours. HbA1C: No results for input(s): "HGBA1C" in the last 72 hours. CBG: Recent Labs  Lab 08/14/22 0102 08/14/22 0647 08/14/22 1536  GLUCAP 105* 241* 377*  Lipid Profile: No results for input(s): "CHOL", "HDL", "LDLCALC", "TRIG", "CHOLHDL", "LDLDIRECT" in the last 72 hours. Thyroid Function Tests: No results for input(s): "TSH", "T4TOTAL", "FREET4", "T3FREE", "THYROIDAB" in the last 72 hours. Anemia Panel: No results for input(s): "VITAMINB12", "FOLATE", "FERRITIN", "TIBC", "IRON", "RETICCTPCT" in the last 72 hours. Urine analysis:    Component Value Date/Time   COLORURINE YELLOW 08/14/2022 1531   APPEARANCEUR CLEAR 08/14/2022 1531   LABSPEC 1.036 (H) 08/14/2022 1531   PHURINE 7.0 08/14/2022 1531   GLUCOSEU >=500 (A) 08/14/2022 1531   HGBUR NEGATIVE 08/14/2022 1531   BILIRUBINUR NEGATIVE 08/14/2022 1531   KETONESUR 5 (A) 08/14/2022 1531   PROTEINUR >=300 (A) 08/14/2022 1531   UROBILINOGEN 1.0 10/04/2009 1109   NITRITE NEGATIVE 08/14/2022 1531   LEUKOCYTESUR NEGATIVE 08/14/2022 1531    Radiological Exams on Admission: US Abdomen Limited RUQ (LIVER/GB)  Result Date: 08/14/2022 CLINICAL DATA:  Right upper quadrant pain and vomiting EXAM: ULTRASOUND ABDOMEN LIMITED RIGHT  UPPER QUADRANT COMPARISON:  CT of earlier today FINDINGS: Gallbladder: Small gallstones. Wall thickening at 5 mm. Trace pericholecystic edema and fluid, including on CT. The technologist describes tenderness with gallbladder palpation. Common bile duct: Diameter: Normal, 5 mm Liver: No focal lesion identified. Within normal limits in parenchymal echogenicity. Portal vein is patent on color Doppler imaging with normal direction of blood flow towards the liver. Other: None. IMPRESSION: Cholelithiasis with findings suspicious for acute cholecystitis. Electronically Signed   By: Abigail Miyamoto M.D.   On: 08/14/2022 10:43   CT ABDOMEN PELVIS W CONTRAST  Result Date: 08/14/2022 CLINICAL DATA:  Abdominal pain, constipation, and vomiting. EXAM: CT ABDOMEN AND PELVIS WITH CONTRAST TECHNIQUE: Multidetector CT imaging of the abdomen and pelvis was performed using the standard protocol following bolus administration of intravenous contrast. RADIATION DOSE REDUCTION: This exam was performed according to the departmental dose-optimization program which includes automated exposure control, adjustment of the mA and/or kV according to patient size and/or use of iterative reconstruction technique. CONTRAST:  149m OMNIPAQUE IOHEXOL 300 MG/ML  SOLN COMPARISON:  None Available. FINDINGS: Lower chest: The heart is enlarged and a few coronary artery calcifications are noted. Mild atelectasis or scarring is present at the lung bases. Hepatobiliary: No focal liver abnormality. No biliary ductal dilatation. Stones are present within the gallbladder. There is gallbladder wall thickening with pericholecystic edema and fat stranding Pancreas: Pancreatic atrophy. No pancreatic ductal dilatation or surrounding inflammatory changes. Spleen: Normal in size without focal abnormality. Adrenals/Urinary Tract: No adrenal nodule or mass. The kidneys enhance symmetrically. A large cyst with thin rim calcification is noted in the lower pole the right  kidney measuring 9.8 cm. No renal calculus or hydronephrosis. The bladder is unremarkable. Stomach/Bowel: There is thickening of the walls of the distal esophagus and a small hiatal hernia is noted. The stomach is otherwise within normal limits. No bowel obstruction, free air, or pneumatosis. The appendix is not visualized on exam, however there are no inflammatory changes in the right lower quadrant. Vascular/Lymphatic: Aortic atherosclerosis. No abdominal or pelvic lymphadenopathy by size criteria. Reproductive: Metallic densities are present in the prostate gland, possibly representing radiation therapy seeds. Other: Fat containing left inguinal and umbilical hernias are noted. No ascites. Musculoskeletal: Increased density is noted in the retroareolar regions bilaterally, greater on the right than on the left which may be due to patient positioning, possible gynecomastia. Degenerative changes are present in the thoracolumbar spine. No acute osseous abnormality. IMPRESSION: 1. Cholelithiasis with gallbladder wall thickening and surrounding inflammatory changes, possible acute cholecystitis.  Ultrasound is recommended for further evaluation. 2. Thickening of the walls of the distal esophagus with small hiatal hernia. Endoscopy is suggested for further evaluation on follow-up. 3. Right renal cyst. 4. Aortic atherosclerosis. Electronically Signed   By: Brett Fairy M.D.   On: 08/14/2022 04:55    EKG: Independently reviewed.  Third-degree AV block  Assessment/Plan Principal Problem:   Cholecystitis Active Problems:   Acute cholecystitis  (please populate well all problems here in Problem List. (For example, if patient is on BP meds at home and you resume or decide to hold them, it is a problem that needs to be her. Same for CAD, COPD, HLD and so on)  Persistent third-degree AV block -No acute syncope or near syncope after discharge on last admission. -Appears to still have at least Mobitz type II AV  block at baseline.  Discussed with on-call cardiology, who will help [place] to evaluate for PPM.  As patient K level appears to be in normal limits, check magnesium and phosphorus level, 1 dose of magnesium 2 g given  Acute cholecystitis -General surgical consultation appreciated -N.p.o., continue Zosyn, and IV fluid -DDx, send troponin to rule out ACS given there is also recurrence of high-grade AV block this time.  IIDM -N.p.o., hold off home dose of insulin, sliding scale every 6 hours.  HTN -Hold off home BP meds -As needed hydralazine for now  DVT prophylaxis: Lovenox Code Status: Full code Family Communication: Daughter at bedside Disposition Plan: Patient sick with acute cholecystitis requiring inpatient antibiotics and likely surgery, and high-grade up AV block indication for PPM, expect more than 2 midnight hospital stay Consults called: Neurosurgery and cardiology Admission status: Telemetry admission   Lequita Halt MD [facility] Pager [phone]  08/14/2022, 4:21 PM

## 2022-08-14 NOTE — ED Provider Notes (Signed)
[facility] Provider Note   CSN: [id number] Arrival date & time: 08/14/22  0014     History  Chief Complaint  Patient presents with  . Abdominal Pain    Andre Stout is a 82 y.o. male.  With PMH of HTN, DM, seizures, AKI, history of heart block s/p pacemaker placement presenting with abdominal pain, nausea, vomiting and decreased bowel movement.  Patient noted started feeling unwell yesterday with some upper abdominal discomfort associate with nausea followed by nonbloody nonbilious emesis.  He is still feeling nauseous today.  His last bowel movement was a couple days ago but still passing gas and no history of abdominal surgery.  He denies any fevers, URI symptoms, UTI symptoms, chest pain, shortness of breath.   Abdominal Pain      Home Medications Prior to Admission medications   Medication Sig Start Date End Date Taking? Authorizing Provider  acetaminophen (TYLENOL) 500 MG tablet Take 1,000 mg by mouth daily as needed (pain).   Yes [provider]  aspirin 81 MG chewable tablet Chew 1 tablet (81 mg total) by mouth daily. Patient taking differently: Chew 81 mg by mouth at bedtime. 01/06/22  Yes Swayze, Ava, DO  brimonidine (ALPHAGAN) 0.2 % ophthalmic solution Place 1 drop into both eyes 2 (two) times daily with breakfast and lunch. 12/18/21  Yes [provider]  cetirizine (ZYRTEC) 10 MG tablet Take 10 mg by mouth daily as needed (seasonal allergies).   Yes [provider]  insulin NPH-regular Human (70-30) 100 UNIT/ML injection Inject 40 Units into the skin 2 (two) times daily with a meal. Patient taking differently: Inject 36 Units into the skin 2 (two) times daily with a meal. 08/04/22  Yes Pahwani, Ravi, MD  latanoprost (XALATAN) 0.005 % ophthalmic solution Place 1 drop into both eyes at bedtime. 01/01/22  Yes [provider]  magnesium oxide (MAG-OX) 400 (240 Mg) MG tablet Take 400 mg by mouth at  bedtime. 06/06/22  Yes [provider]  Multiple Vitamins-Minerals (PRESERVISION AREDS 2) CAPS Take 1 capsule by mouth at bedtime.   Yes [provider]  amLODipine (NORVASC) 10 MG tablet Take 1 tablet (10 mg total) by mouth daily. Patient not taking: Reported on 08/14/2022 08/05/22 09/04/22  Darliss Cheney, MD  BD INSULIN SYRINGE U/F 31G X 5/16" 1 ML MISC USE 2 DAILY 08/24/21   Renato Shin, MD  dorzolamide-timolol (COSOPT) 22.3-6.8 MG/ML ophthalmic solution Place 1 drop into both eyes 2 (two) times daily with breakfast and lunch. 05/18/22   [provider]  lisinopril (ZESTRIL) 20 MG tablet Take 1 tablet (20 mg total) by mouth daily. Patient not taking: Reported on 08/14/2022 08/05/22 09/04/22  Darliss Cheney, MD  [facility] DELICA LANCETS 95G MISC Use to check blood sugar 4 times per day. Dx code: E11.9 03/27/16   Renato Shin, MD  [facility] ULTRA test strip USE TO MONITOR GLUCOSE LEVELS 4 TIMES PER DAY E11.9 06/10/19   Renato Shin, MD      Allergies    Patient has no known allergies.    Review of Systems   Review of Systems  Gastrointestinal:  Positive for abdominal pain.    Physical Exam Updated Vital Signs BP (!) 171/73   Pulse 63   Temp 98.1 F (36.7 C) (Oral)   Resp 20   SpO2 94%  Physical Exam Constitutional: Alert and oriented. Well appearing and in no distress. Eyes: Conjunctivae are normal. ENT      Head: Normocephalic  and atraumatic.      Nose: No congestion.      Mouth/Throat: Mucous membranes are moist.      Neck: No stridor. Cardiovascular: S1, S2, regular rate.  Warm and well perfused. Respiratory: Normal respiratory effort. Breath sounds are normal.  O2 sat 96 on room air. Gastrointestinal: Soft and distended with upper epigastrium and right upper quadrant tenderness to palpation, no rebound, no guarding, no CVA tenderness  musculoskeletal: Normal range of motion in all extremities. Equal bilateral nontender pitting edema extending from feet to  knee Neurologic: Normal speech and language. No gross focal neurologic deficits are appreciated. Skin: Skin is warm, dry and intact. No rash noted. Psychiatric: Mood and affect are normal. Speech and behavior are normal.  ED Results / Procedures / Treatments   Labs (all labs ordered are listed, but only abnormal results are displayed) Labs Reviewed  CBC WITH DIFFERENTIAL/PLATELET - Abnormal; Notable for the following components:      Result Value   WBC 19.9 (*)    Neutro Abs 16.1 (*)    Monocytes Absolute 1.5 (*)    Abs Immature Granulocytes 0.13 (*)    All other components within normal limits  COMPREHENSIVE METABOLIC PANEL - Abnormal; Notable for the following components:   Glucose, Bld 116 (*)    Albumin 3.1 (*)    Total Bilirubin 1.6 (*)    All other components within normal limits  PROTIME-INR - Abnormal; Notable for the following components:   Prothrombin Time 16.0 (*)    INR 1.3 (*)    All other components within normal limits  URINALYSIS, ROUTINE W REFLEX MICROSCOPIC - Abnormal; Notable for the following components:   Specific Gravity, Urine 1.036 (*)    Glucose, UA >=500 (*)    Ketones, ur 5 (*)    Protein, ur >=300 (*)    All other components within normal limits  CBG MONITORING, ED - Abnormal; Notable for the following components:   Glucose-Capillary 105 (*)    All other components within normal limits  CBG MONITORING, ED - Abnormal; Notable for the following components:   Glucose-Capillary 241 (*)    All other components within normal limits  CBG MONITORING, ED - Abnormal; Notable for the following components:   Glucose-Capillary 377 (*)    All other components within normal limits  APTT  CBC WITH DIFFERENTIAL/PLATELET  TYPE AND SCREEN  ABO/RH    EKG EKG Interpretation  Date/Time:  Tuesday August 14 2022 15:44:35 EDT Ventricular Rate:  65 PR Interval:    QRS Duration: 103 QT Interval:  415 QTC Calculation: 432 R Axis:   -19 Text Interpretation: AV  block, 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 Wall thickening at 5 mm. Trace pericholecystic edema and fluid, including on CT. The technologist describes tenderness with gallbladder palpation. Common bile duct: Diameter: Normal, 5 mm Liver: No focal lesion identified. Within normal limits in parenchymal echogenicity. Portal vein is patent on color Doppler imaging with normal direction of blood flow towards the liver. Other: None. IMPRESSION: Cholelithiasis with findings suspicious for acute cholecystitis. Electronically Signed   By: Abigail Miyamoto M.D.   On:  08/14/2022 10:43   CT ABDOMEN PELVIS W CONTRAST  Result Date: 08/14/2022 CLINICAL DATA:  Abdominal pain, constipation, and vomiting. EXAM: CT ABDOMEN AND PELVIS WITH CONTRAST TECHNIQUE: Multidetector CT imaging of the abdomen and pelvis was performed using the standard protocol following bolus administration of intravenous contrast. RADIATION DOSE REDUCTION: This exam was performed according to the departmental dose-optimization program which includes automated exposure control, adjustment of the mA and/or kV according to patient size and/or use of iterative reconstruction technique. CONTRAST:  128m OMNIPAQUE IOHEXOL 300 MG/ML  SOLN COMPARISON:  None Available. FINDINGS: Lower chest: The heart is enlarged and a few coronary artery calcifications are noted. Mild atelectasis or scarring is present at the lung bases. Hepatobiliary: No focal liver abnormality. No biliary ductal dilatation. Stones are present within the  gallbladder. There is gallbladder wall thickening with pericholecystic edema and fat stranding Pancreas: Pancreatic atrophy. No pancreatic ductal dilatation or surrounding inflammatory changes. Spleen: Normal in size without focal abnormality. Adrenals/Urinary Tract: No adrenal nodule or mass. The kidneys enhance symmetrically. A large cyst with thin rim calcification is noted in the lower pole the right kidney measuring 9.8 cm. No renal calculus or hydronephrosis. The bladder is unremarkable. Stomach/Bowel: There is thickening of the walls of the distal esophagus and a small hiatal hernia is noted. The stomach is otherwise within normal limits. No bowel obstruction, free air, or pneumatosis. The appendix is not visualized on exam, however there are no inflammatory changes in the right lower quadrant. Vascular/Lymphatic: Aortic atherosclerosis. No abdominal or pelvic lymphadenopathy by size criteria. Reproductive: Metallic densities are present in the prostate gland, possibly representing radiation therapy seeds. Other: Fat containing left inguinal and umbilical hernias are noted. No ascites. Musculoskeletal: Increased density is noted in the retroareolar regions bilaterally, greater on the right than on the left which may be due to patient positioning, possible gynecomastia. Degenerative changes are present in the thoracolumbar spine. No acute osseous abnormality. IMPRESSION: 1. Cholelithiasis with gallbladder wall thickening and surrounding inflammatory changes, possible acute cholecystitis. Ultrasound is recommended for further evaluation. 2. Thickening of the walls of the distal esophagus with small hiatal hernia. Endoscopy is suggested for further evaluation on follow-up. 3. Right renal cyst. 4. Aortic atherosclerosis. Electronically Signed   By: LBrett FairyM.D.   On: 08/14/2022 04:55    Procedures Procedures    Medications Ordered in ED Medications  lactated ringers bolus PEDS (1,000 mLs Intravenous  New Bag/Given 08/14/22 1527)  insulin aspart (novoLOG) injection 0-15 Units (has no administration in time range)  0.9 %  sodium chloride infusion ( Intravenous New Bag/Given 08/14/22 1528)  ondansetron (ZOFRAN) injection 4 mg (has no administration in time range)  HYDROmorphone (DILAUDID) injection 0.5 mg (has no administration in time range)  piperacillin-tazobactam (ZOSYN) IVPB 3.375 g (has no administration in time range)  acetaminophen (TYLENOL) tablet 1,000 mg (has no administration in time range)  aspirin chewable tablet 81 mg (has no administration in time range)  brimonidine (ALPHAGAN) 0.2 % ophthalmic solution 1 drop (has no administration in time range)  dorzolamide-timolol (COSOPT) 22.3-6.8 MG/ML ophthalmic solution 1 drop (has no administration in time range)  latanoprost (XALATAN) 0.005 % ophthalmic solution 1 drop (has no administration in time range)  enoxaparin (LOVENOX) injection 40 mg (has no administration in time range)  hydrALAZINE (APRESOLINE) injection 5 mg (has no administration in time range)  iohexol (OMNIPAQUE) 300 MG/ML solution 100 mL (100 mLs Intravenous Contrast Given 08/14/22 0404)  piperacillin-tazobactam (ZOSYN) IVPB 3.375 g (3.375 g Intravenous New Bag/Given  08/14/22 1153)  ondansetron (ZOFRAN) injection 4 mg (4 mg Intravenous Given 08/14/22 1153)    ED Course/ Medical Decision Making/ A&P Clinical Course as of 08/14/22 1605  Tue Aug 14, 2022  1204 Spoke with Claiborne Billings of general surgery who request hospitalist admission with cardiology consult to clear patient due to recent admission for heart block requiring pacemaker placement and unclear if patient would be able to go under general anesthesia at this time.  We will plan to admit to hospitalist with general surgery consult. [VB]  1208 Patient with vomiting, nausea and upper abdominal pain found to have cholecystitis on both CT scan and right upper quadrant ultrasound consistent with leukocytosis 19.9 and left  shift.  Creatinine 1.07.  No transaminitis.  Slightly elevated total bilirubin 1.6.  Common bile duct within normal limits, unlikely cholangitis or choledocholithiasis.  No hypotension or ill appearance concerning for sepsis.  Will cover patient with IV fluids, IV Zosyn and IV Zofran for symptom control.  Spoke with general surgery who request hospitalist admission with cardiology consult due to recent admission for heart block requiring pacemaker placement. [VB]  1583 Patient admitted to the hospitalist for further work-up and management. [VB]    Clinical Course User Index [VB] Elgie Congo, MD                           Medical Decision Making Andre Stout is a 82 y.o. male.  With PMH of HTN, DM, seizures, AKI, history of heart block s/p pacemaker placement presenting with abdominal pain, nausea, vomiting and decreased bowel movement.   Amount and/or Complexity of Data Reviewed Labs: ordered. Decision-making details documented in ED Course. Radiology: independent interpretation performed. Decision-making details documented in ED Course.    Details: Right upper quadrant ultrasound with gallbladder wall thickening concerning for cholecystitis  Risk Prescription drug management. Decision regarding hospitalization.      Final Clinical Impression(s) / ED Diagnoses Final diagnoses:  Abdominal pain, unspecified abdominal location  Cholecystitis    Rx / DC Orders ED Discharge Orders     None         Elgie Congo, MD 08/14/22 1605

## 2022-08-14 NOTE — ED Triage Notes (Signed)
Pt grandson says that pt has not had a BM in several days, vomiting x 2, upper abdominal pain, decreased intake, some confusion. Pt slid out of the bed tonight, and was lying on the floor per grandson.

## 2022-08-14 NOTE — ED Notes (Signed)
[facility] transport called to go to [facility]

## 2022-08-14 NOTE — Progress Notes (Signed)
Pharmacy Antibiotic Note  Andre Stout is a 82 y.o. male admitted on 08/14/2022 with  intra-abdominal infection .  Pharmacy has been consulted for Zosyn dosing.  Plan: Zosyn 3.375g IV q8h (4 hour infusion).     Temp (24hrs), Avg:98.5 F (36.9 C), Min:97.9 F (36.6 C), Max:99.6 F (37.6 C)  Recent Labs  Lab 08/14/22 0122  WBC 19.9*  CREATININE 1.07    Estimated Creatinine Clearance: 55.4 mL/min (by C-G formula based on SCr of 1.07 mg/dL).    No Known Allergies   Thank you for allowing pharmacy to be a part of this patient's care.  Ventura Sellers 08/14/2022 3:16 PM

## 2022-08-14 NOTE — Consult Note (Addendum)
Andre Stout 01-Aug-1940  [id number].    Requesting MD: Dr. Georgina Snell, EDP Chief Complaint/Reason for Consult: cholecystitis  HPI:  This is an 82 yo male with a history of HTN, DM, and prostate cancer, who was recently admitted secondary to several episodes of syncopal type incidents.  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  He was seen by cardiology who thought he may need a PPM.  He has followed up with cardiology since discharge and is being placed on a heart monitor per the chart.  Saturday he ate some pizza and developed N/V after that for the last several days.  He denies any abdominal pain.  He denies any fevers, chills, SOB, chest pain, dysuria, etc.  Due to persistent N/V/D, he presented to the ED today for evaluation.  He has been found to have a WBC of 19K and a TB of 1.6, but all other LFTs were normal.  His electrolytes were all relatively stable as well.  He underwent a CT scan and then an [place] that suggested cholelithiasis with some wall thickening up to 59m c/w cholecystitis.  We have been asked to see him for further evaluation and recommendations.  ROS: ROS: Please see HPI  Family History  Problem Relation Age of Onset   Diabetes Father    Diabetes Paternal Grandfather     Past Medical History:  Diagnosis Date   Diabetes mellitus without complication ([place]    Glaucoma    Heart block    complete heart block   Hypertension    Prostate cancer ([facility]    Been 3-4 years ago    Past Surgical History:  Procedure Laterality Date   CATARACT EXTRACTION Bilateral    INSERTION PROSTATE RADIATION SEED      Social History:  reports that he quit smoking about 47 years ago. His smoking use included cigarettes. He has a 2.00 pack-year smoking history. He has never used smokeless tobacco. He reports that he does not drink alcohol and does not use drugs.  Allergies: No Known Allergies  (Not in a hospital admission)    Physical Exam: Blood pressure (!)  166/78, pulse 66, temperature 97.9 F (36.6 C), temperature source Oral, resp. rate 17, SpO2 95 %. General: pleasant, WD, WN male who is laying in bed in NAD HEENT: head is normocephalic, atraumatic.  Sclera are noninjected.  PERRL.  Ears and nose without any masses or lesions.  Mouth is pink and moist Heart: regular rhythm.  Normal s1,s2. No obvious murmurs, gallops, or rubs noted.  Palpable radial and pedal pulses bilaterally Lungs: CTAB, no wheezes, rhonchi, or rales noted.  Respiratory effort nonlabored Abd: soft, mildly tender in RUQ with some voluntary guarding to deep palpation, ND, +BS, no masses, hernias, or organomegaly MS: all 4 extremities are symmetrical with no cyanosis, clubbing.  He does have BLE edema Psych: A&Ox3 with an appropriate affect.   Results for orders placed or performed during the hospital encounter of 08/14/22 (from the past 48 hour(s))  CBG monitoring, ED     Status: Abnormal   Collection Time: 08/14/22  1:02 AM  Result Value Ref Range   Glucose-Capillary 105 (H) 70 - 99 mg/dL    Comment: Glucose reference range applies only to samples taken after fasting for at least 8 hours.  CBC with Differential/Platelet     Status: Abnormal   Collection Time: 08/14/22  1:22 AM  Result Value Ref Range   WBC 19.9 (H) 4.0 - 10.5  K/uL   RBC 5.47 4.22 - 5.81 MIL/uL   Hemoglobin 15.0 13.0 - 17.0 g/dL   HCT 45.8 39.0 - 52.0 %   MCV 83.7 80.0 - 100.0 fL   MCH 27.4 26.0 - 34.0 pg   MCHC 32.8 30.0 - 36.0 g/dL   RDW 14.2 11.5 - 15.5 %   Platelets 334 150 - 400 K/uL   nRBC 0.0 0.0 - 0.2 %   Neutrophils Relative % 81 %   Neutro Abs 16.1 (H) 1.7 - 7.7 K/uL   Lymphocytes Relative 10 %   Lymphs Abs 2.0 0.7 - 4.0 K/uL   Monocytes Relative 8 %   Monocytes Absolute 1.5 (H) 0.1 - 1.0 K/uL   Eosinophils Relative 0 %   Eosinophils Absolute 0.0 0.0 - 0.5 K/uL   Basophils Relative 0 %   Basophils Absolute 0.1 0.0 - 0.1 K/uL   Immature Granulocytes 1 %   Abs Immature Granulocytes  0.13 (H) 0.00 - 0.07 K/uL    Comment: Performed at [facility] [address], [place], [place] [address]  Comprehensive metabolic panel     Status: Abnormal   Collection Time: 08/14/22  1:22 AM  Result Value Ref Range   Sodium 137 135 - 145 mmol/L   Potassium 3.8 3.5 - 5.1 mmol/L   Chloride 102 98 - 111 mmol/L   CO2 25 22 - 32 mmol/L   Glucose, Bld 116 (H) 70 - 99 mg/dL    Comment: Glucose reference range applies only to samples taken after fasting for at least 8 hours.   BUN 11 8 - 23 mg/dL   Creatinine, Ser 1.07 0.61 - 1.24 mg/dL   Calcium 9.3 8.9 - 10.3 mg/dL   Total Protein 7.4 6.5 - 8.1 g/dL   Albumin 3.1 (L) 3.5 - 5.0 g/dL   AST 16 15 - 41 U/L   ALT 11 0 - 44 U/L   Alkaline Phosphatase 87 38 - 126 U/L   Total Bilirubin 1.6 (H) 0.3 - 1.2 mg/dL   GFR, Estimated >60 >60 mL/min    Comment: (NOTE) Calculated using the CKD-EPI Creatinine Equation (2021)    Anion gap 10 5 - 15    Comment: Performed at [facility], [place] [address], [place], [place] [address]  ABO/Rh     Status: None   Collection Time: 08/14/22  1:22 AM  Result Value Ref Range   ABO/RH(D)      A NEG Performed at [facility] [address] [place], [place] [address]   CBG monitoring, ED     Status: Abnormal   Collection Time: 08/14/22  6:47 AM  Result Value Ref Range   Glucose-Capillary 241 (H) 70 - 99 mg/dL    Comment: Glucose reference range applies only to samples taken after fasting for at least 8 hours.   Comment 1 Notify RN    Comment 2 Document in Chart   Type and screen [place]     Status: None   Collection Time: 08/14/22 12:00 PM  Result Value Ref Range   ABO/RH(D) A NEG    Antibody Screen NEG    Sample Expiration      08/17/2022,2359 Performed at [facility], [place] [address] [place], [place] [address]   Protime-INR     Status: Abnormal   Collection Time: 08/14/22 12:00 PM  Result Value Ref Range   Prothrombin Time 16.0 (H) 11.4 - 15.2  seconds   INR  1.3 (H) 0.8 - 1.2    Comment: (NOTE) INR goal varies based on device and disease states. Performed at [facility], [place] [address], [place], [place] [address]   APTT     Status: None   Collection Time: 08/14/22 12:00 PM  Result Value Ref Range   aPTT 31 24 - 36 seconds    Comment: Performed at [facility] [address], [place], [place] [address]   US Abdomen Limited RUQ (LIVER/GB)  Result Date: 08/14/2022 CLINICAL DATA:  Right upper quadrant pain and vomiting EXAM: ULTRASOUND ABDOMEN LIMITED RIGHT UPPER QUADRANT COMPARISON:  CT of earlier today FINDINGS: Gallbladder: Small gallstones. Wall thickening at 5 mm. Trace pericholecystic edema and fluid, including on CT. The technologist describes tenderness with gallbladder palpation. Common bile duct: Diameter: Normal, 5 mm Liver: No focal lesion identified. Within normal limits in parenchymal echogenicity. Portal vein is patent on color Doppler imaging with normal direction of blood flow towards the liver. Other: None. IMPRESSION: Cholelithiasis with findings suspicious for acute cholecystitis. Electronically Signed   By: Abigail Miyamoto M.D.   On: 08/14/2022 10:43   CT ABDOMEN PELVIS W CONTRAST  Result Date: 08/14/2022 CLINICAL DATA:  Abdominal pain, constipation, and vomiting. EXAM: CT ABDOMEN AND PELVIS WITH CONTRAST TECHNIQUE: Multidetector CT imaging of the abdomen and pelvis was performed using the standard protocol following bolus administration of intravenous contrast. RADIATION DOSE REDUCTION: This exam was performed according to the departmental dose-optimization program which includes automated exposure control, adjustment of the mA and/or kV according to patient size and/or use of iterative reconstruction technique. CONTRAST:  165m OMNIPAQUE IOHEXOL 300 MG/ML  SOLN COMPARISON:  None Available. FINDINGS: Lower chest: The heart is enlarged and a few coronary artery calcifications are noted. Mild atelectasis or  scarring is present at the lung bases. Hepatobiliary: No focal liver abnormality. No biliary ductal dilatation. Stones are present within the gallbladder. There is gallbladder wall thickening with pericholecystic edema and fat stranding Pancreas: Pancreatic atrophy. No pancreatic ductal dilatation or surrounding inflammatory changes. Spleen: Normal in size without focal abnormality. Adrenals/Urinary Tract: No adrenal nodule or mass. The kidneys enhance symmetrically. A large cyst with thin rim calcification is noted in the lower pole the right kidney measuring 9.8 cm. No renal calculus or hydronephrosis. The bladder is unremarkable. Stomach/Bowel: There is thickening of the walls of the distal esophagus and a small hiatal hernia is noted. The stomach is otherwise within normal limits. No bowel obstruction, free air, or pneumatosis. The appendix is not visualized on exam, however there are no inflammatory changes in the right lower quadrant. Vascular/Lymphatic: Aortic atherosclerosis. No abdominal or pelvic lymphadenopathy by size criteria. Reproductive: Metallic densities are present in the prostate gland, possibly representing radiation therapy seeds. Other: Fat containing left inguinal and umbilical hernias are noted. No ascites. Musculoskeletal: Increased density is noted in the retroareolar regions bilaterally, greater on the right than on the left which may be due to patient positioning, possible gynecomastia. Degenerative changes are present in the thoracolumbar spine. No acute osseous abnormality. IMPRESSION: 1. Cholelithiasis with gallbladder wall thickening and surrounding inflammatory changes, possible acute cholecystitis. Ultrasound is recommended for further evaluation. 2. Thickening of the walls of the distal esophagus with small hiatal hernia. Endoscopy is suggested for further evaluation on follow-up. 3. Right renal cyst. 4. Aortic atherosclerosis. Electronically Signed   By: LBrett FairyM.D.   On:  08/14/2022 04:55      Assessment/Plan Cholecystitis The patient has been seen and examined,  labs, chart, vitals, EKGs, and imaging personally reviewed.  He appears to have cholecystitis.  However, his complicating issue here is his heart block.  He can not safely undergo general anesthesia with this.  We will need cardiology evaluation and recommendations on how to proceed.  The likely best treatment while cardiology continues to work this up is a perc chole drain to temporize his acute infection, but will await their recommendations for how to safely proceed.  In the interim, start abx therapy and remain NPO for now.  We have explained this to the patient as well as his daughter and all questions were answered.  He understands each different scenario and the reasons for each.  We will continue to follow along.   FEN - NPO/IVFs VTE - ok for chemical prophylaxis from our standpoint ID - zosyn given in ED  Complete heart block HTN DM H/o prostate cancer  I reviewed ED provider notes, last 24 h vitals and pain scores, last 48 h intake and output, last 24 h labs and trends, and last 24 h imaging results.  Henreitta Cea, [facility] 08/14/2022, 2:04 PM Please see Amion for pager number during day hours 7:00am-4:30pm or 7:00am -11:30am on weekends

## 2022-08-15 DIAGNOSIS — K810 Acute cholecystitis: Secondary | ICD-10-CM

## 2022-08-15 DIAGNOSIS — K819 Cholecystitis, unspecified: Secondary | ICD-10-CM | POA: Diagnosis not present

## 2022-08-15 DIAGNOSIS — I442 Atrioventricular block, complete: Secondary | ICD-10-CM

## 2022-08-15 LAB — COMPREHENSIVE METABOLIC PANEL
ALT: 11 U/L (ref 0–44)
AST: 10 U/L — ABNORMAL LOW (ref 15–41)
Albumin: 2.6 g/dL — ABNORMAL LOW (ref 3.5–5.0)
Alkaline Phosphatase: 66 U/L (ref 38–126)
Anion gap: 7 (ref 5–15)
BUN: 15 mg/dL (ref 8–23)
CO2: 27 mmol/L (ref 22–32)
Calcium: 8.5 mg/dL — ABNORMAL LOW (ref 8.9–10.3)
Chloride: 106 mmol/L (ref 98–111)
Creatinine, Ser: 1.09 mg/dL (ref 0.61–1.24)
GFR, Estimated: >60 mL/min (ref >60)
Glucose, Bld: 84 mg/dL (ref 70–99)
Potassium: 3.5 mmol/L (ref 3.5–5.1)
Sodium: 140 mmol/L (ref 135–145)
Total Bilirubin: 1.6 mg/dL — ABNORMAL HIGH (ref 0.3–1.2)
Total Protein: 6.4 g/dL — ABNORMAL LOW (ref 6.5–8.1)

## 2022-08-15 LAB — CBC
HCT: 39.6 % (ref 39.0–52.0)
Hemoglobin: 12.9 g/dL — ABNORMAL LOW (ref 13.0–17.0)
MCH: 27.5 pg (ref 26.0–34.0)
MCHC: 32.6 g/dL (ref 30.0–36.0)
MCV: 84.4 fL (ref 80.0–100.0)
Platelets: 302 10*3/uL (ref 150–400)
RBC: 4.69 MIL/uL (ref 4.22–5.81)
RDW: 14.4 % (ref 11.5–15.5)
WBC: 12.8 10*3/uL — ABNORMAL HIGH (ref 4.0–10.5)
nRBC: 0 % (ref 0.0–0.2)

## 2022-08-15 LAB — GLUCOSE, CAPILLARY
Glucose-Capillary: 114 mg/dL — ABNORMAL HIGH (ref 70–99)
Glucose-Capillary: 124 mg/dL — ABNORMAL HIGH (ref 70–99)
Glucose-Capillary: 137 mg/dL — ABNORMAL HIGH (ref 70–99)
Glucose-Capillary: 138 mg/dL — ABNORMAL HIGH (ref 70–99)
Glucose-Capillary: 63 mg/dL — ABNORMAL LOW (ref 70–99)
Glucose-Capillary: 70 mg/dL (ref 70–99)
Glucose-Capillary: 70 mg/dL (ref 70–99)

## 2022-08-15 MED ORDER — DEXTROSE 50 % IV SOLN
12.5000 g | INTRAVENOUS | Status: AC
  Administered 2022-08-15: 12.5 g via INTRAVENOUS
  Filled 2022-08-15: qty 50

## 2022-08-15 MED ORDER — KCL IN DEXTROSE-NACL 20-5-0.9 MEQ/L-%-% IV SOLN
INTRAVENOUS | Status: DC
  Filled 2022-08-15 (×4): qty 1000

## 2022-08-15 NOTE — TOC Initial Note (Signed)
Transition of Care [facility]) - Initial/Assessment Note    Patient Details  Name: Andre Stout MRN: [id number] Date of Birth: [birth year]  [facility]) CM/SW Contact:    Leeroy Cha, RN Phone Number: 08/15/2022, 7:57 AM  Clinical Narrative:                  Transition of Care [facility]) Screening Note   Patient Details  Name: Andre Stout Date of Birth: [birth year]   Transition of [facility]) CM/SW Contact:    Leeroy Cha, RN Phone Number: 08/15/2022, 7:57 AM    Transition of Care Department [facility]) has reviewed patient and no TOC needs have been identified at this time. We will continue to monitor patient advancement through interdisciplinary progression rounds. If new patient transition needs arise, please place a TOC consult.    Expected Discharge Plan: Home/Self Care Barriers to Discharge: Continued Medical Work up   Patient Goals and CMS Choice Patient states their goals for this hospitalization and ongoing recovery are:: to go home CMS Medicare.gov Compare Post Acute Care list provided to:: Patient    Expected Discharge Plan and Services Expected Discharge Plan: Home/Self Care   Discharge Planning Services: CM Consult   Living arrangements for the past 2 months: Single Family Home                                      Prior Living Arrangements/Services Living arrangements for the past 2 months: Single Family Home Lives with:: Self (widowed) Patient language and need for interpreter reviewed:: Yes Do you feel safe going back to the place where you live?: Yes            Criminal Activity/Legal Involvement Pertinent to Current Situation/Hospitalization: No - Comment as needed  Activities of Daily Living Home Assistive Devices/Equipment: Dentures (specify type), CBG Meter, Walker (specify type), Eyeglasses ADL Screening (condition at time of admission) Patient's cognitive ability adequate to safely complete daily activities?:  Yes Is the patient deaf or have difficulty hearing?: No Does the patient have difficulty seeing, even when wearing glasses/contacts?: No Does the patient have difficulty concentrating, remembering, or making decisions?: No Patient able to express need for assistance with ADLs?: Yes Does the patient have difficulty dressing or bathing?: No Independently performs ADLs?: Yes (appropriate for developmental age) Does the patient have difficulty walking or climbing stairs?: Yes Weakness of Legs: Both Weakness of Arms/Hands: None  Permission Sought/Granted                  Emotional Assessment Appearance:: Appears stated age Attitude/Demeanor/Rapport: Engaged Affect (typically observed): Calm Orientation: : Oriented to Self, Oriented to Place, Oriented to  Time, Oriented to Situation Alcohol / Substance Use: Tobacco Use (quit 47 years ago) Psych Involvement: No (comment)  Admission diagnosis:  Cholecystitis [K81.9] Abdominal pain, unspecified abdominal location [R10.9] Patient Active Problem List   Diagnosis Date Noted   Acute cholecystitis 08/14/2022   Cholecystitis 08/14/2022   Complete heart block ([place]) 07/31/2022   Diarrhea 07/31/2022   Seizure ([place]) 01/02/2022   Abnormal EKG 01/02/2022   Hypokalemia 01/02/2022   Diabetes ([place]) 09/15/2021   Syncope and collapse 10/09/2020   Prostate cancer ([place]) 03/25/2019   Hypophosphatemia 03/06/2016   Hypomagnesemia 03/06/2016   Influenza A 03/05/2016   Benign essential HTN 03/05/2016   AKI (acute kidney injury) ([place]) 03/04/2016   Routine general medical examination at a health  care facility 05/02/2015   Medicare annual wellness visit, subsequent 05/02/2015   PCP:  [name], [place] Pharmacy:   [id number] [place]Phone: [phone]: [phone] [facility] [place] [address]Phone: [phone]: [phone]    Social Determinants of Health (SDOH) Interventions    Readmission Risk Interventions     No data to display

## 2022-08-15 NOTE — Progress Notes (Signed)
PHARMACY NOTE  Pharmacy has been assisting with dosing of Zosyn for intra-abdominal infection. Dosage remains stable at 3.375g IV q8h (each dose infused over 4 hours) and need for further dosage adjustment appears unlikely at present.    Will sign off at this time.  Please reconsult if a change in clinical status warrants re-evaluation of dosage.  Lindell Spar, PharmD, BCPS Clinical Pharmacist 08/15/2022 9:53 AM

## 2022-08-15 NOTE — Consult Note (Addendum)
Cardiology Consultation:   Patient ID: LUDIE PAVLIK MRN: [id number]; DOB: September 05, 1940  Admit date: 08/14/2022 Date of Consult: 08/15/2022  PCP:  [name], [facility] Cardiologist:  Evalina Field, MD  Electrophysiologist:  Cristopher Peru, MD  {  Patient Profile:   Andre Stout is a 82 y.o. male with a history of syncope secondary to complete heart block in the setting of significant electrolyte abnormalities, hypertension, type 2 diabetes, prostate cancer who is being seen 08/15/2022 for the evaluation of complete heart block at the request of Dr. Roosevelt Locks.  History of Present Illness:   Andre Stout an 82 year old male with the above history who is followed by Dr. Audie Box and Dr. Lovena Le.  He was admitted in 12/2021 for syncope was found to have intermittent complete heart block.  He was markedly hypokalemic with potassium as low as 2.2.  Wheezing was also low at 1.4.  Both were repleted and complete heart block resolved.  Echo during that admission showed LVEF of 55 to 60% with grade 1 diastolic dysfunction, normal RV, and no significant valvular disease.  He was seen by EP and PPM was not felt to be necessary at that time.  Patient monitor showed second-degree AV block Mobitz type I as well as frequent PVCs (13.3% burden) but no complete heart block.  He was recently admitted again from 07/31/2022 to 08/04/2022 with another syncopal event versus seizure.  He was a given and found to be in complete heart block in the setting of significant electrolyte derangement.  Potassium was 2.7 and magnesium was 1.5.  Both were repleted.  He is still had some heart block but had a normal ventricular rate; therefore, no PPM was recommended.  She was recently seen in the clinic by Oda Kilts, PA-C, on 08/13/2022 for follow-up at which time patient reported intermittent dizziness and lightheadedness but denies any recurrent syncope.  EKG showed a complete heart block with narrow  escape at 72 bpm.  Plan was to have him wear a 2-week monitor to see if his symptoms correlated with any heart rate dysfunction.  And then presented to the ED on 08/14/2022 with new onset right upper quadrant abdominal pain and vomiting.  CT was suspicious for acute cholecystitis and confirmed with right upper quadrant ultrasound. WBC 19.9, Hgb 15.0, Plts 334. Na 137, K 3.8, Glucose 116, BUN 11, Cr 1.07. Albumin 3.1, AST 16, ALT 11, Alk Phos 87, Total Bili 1.6. EKG showed complete heart block with rate of 65 bpm as well as LVH and mild ST depressions in leads I and aVL no acute ST/T changes compared to prior tracings.  High-sensitivity troponin minimally elevated and flat at 26 >> 23 which is consistent where it was during last admission and not consistent with ACS.  He is started on antibiotics and General Surgery was consulted.  Daily have laparoscopic cholecystectomy but with his complete heart block a percutaneous drain was felt to possibly be the best option to temporize his acute infection. Cardiology was consulted for assistance.  At the time of this evaluation, patient is resting comfortably in no acute distress.  He is still in complete heart block with rates currently in the 40s.  He is asymptomatic with this.  Patient states he has been having an intermittent very mild lightheadedness and dizziness as described at visit with Oda Kilts, PA-C, above but nothing significant.  No recurrent syncope.  He denies any chest pain, shortness of breath, orthopnea, or PND.  He has chronic lower extremity edema (left greater than right) which is stable and improves with elevating his legs.  States he has been having intermittent abdominal pain and nausea for the past couple weeks but this acutely worsened on the evening of 08/13/2022.  Past Medical History:  Diagnosis Date   Diabetes mellitus without complication ([place])    Glaucoma    Heart block    complete heart block   Hypertension    Prostate cancer  ([place])    Been 3-4 years ago    Past Surgical History:  Procedure Laterality Date   CATARACT EXTRACTION Bilateral    INSERTION PROSTATE RADIATION SEED       Home Medications:  Prior to Admission medications   Medication Sig Start Date End Date Taking? Authorizing Provider  acetaminophen (TYLENOL) 500 MG tablet Take 1,000 mg by mouth daily as needed (pain).   Yes [provider]  aspirin 81 MG chewable tablet Chew 1 tablet (81 mg total) by mouth daily. Patient taking differently: Chew 81 mg by mouth at bedtime. 01/06/22  Yes Swayze, Ava, DO  brimonidine (ALPHAGAN) 0.2 % ophthalmic solution Place 1 drop into both eyes 2 (two) times daily with breakfast and lunch. 12/18/21  Yes [provider]  cetirizine (ZYRTEC) 10 MG tablet Take 10 mg by mouth daily as needed (seasonal allergies).   Yes [provider]  insulin NPH-regular Human (70-30) 100 UNIT/ML injection Inject 40 Units into the skin 2 (two) times daily with a meal. Patient taking differently: Inject 36 Units into the skin 2 (two) times daily with a meal. 08/04/22  Yes Pahwani, Ravi, MD  latanoprost (XALATAN) 0.005 % ophthalmic solution Place 1 drop into both eyes at bedtime. 01/01/22  Yes [provider]  magnesium oxide (MAG-OX) 400 (240 Mg) MG tablet Take 400 mg by mouth at bedtime. 06/06/22  Yes [provider]  Multiple Vitamins-Minerals (PRESERVISION AREDS 2) CAPS Take 1 capsule by mouth at bedtime.   Yes [provider]  amLODipine (NORVASC) 10 MG tablet Take 1 tablet (10 mg total) by mouth daily. Patient not taking: Reported on 08/14/2022 08/05/22 09/04/22  Darliss Cheney, MD  BD INSULIN SYRINGE U/F 31G X 5/16" 1 ML MISC USE 2 DAILY 08/24/21   Renato Shin, MD  dorzolamide-timolol (COSOPT) 22.3-6.8 MG/ML ophthalmic solution Place 1 drop into both eyes 2 (two) times daily with breakfast and lunch. 05/18/22   [provider]  lisinopril (ZESTRIL) 20 MG tablet Take 1 tablet (20 mg  total) by mouth daily. Patient not taking: Reported on 08/14/2022 08/05/22 09/04/22  Darliss Cheney, MD  [facility] DELICA LANCETS 40G MISC Use to check blood sugar 4 times per day. Dx code: E11.9 03/27/16   Renato Shin, MD  [facility] ULTRA test strip USE TO MONITOR GLUCOSE LEVELS 4 TIMES PER DAY E11.9 06/10/19   Renato Shin, MD    Inpatient Medications: Scheduled Meds:  aspirin  81 mg Oral QHS   brimonidine  1 drop Both Eyes BID WC   dextrose  12.5 g Intravenous STAT   dorzolamide-timolol  1 drop Both Eyes BID WC   enoxaparin (LOVENOX) injection  40 mg Subcutaneous Q24H   insulin aspart  0-15 Units Subcutaneous TID WC   insulin glargine-yfgn  40 Units Subcutaneous Daily   latanoprost  1 drop Both Eyes QHS   Continuous Infusions:  sodium chloride 100 mL/hr at 08/14/22 1528   piperacillin-tazobactam (ZOSYN)  IV 3.375 g (08/15/22 0502)   PRN Meds: acetaminophen, hydrALAZINE, HYDROmorphone (DILAUDID)  injection, ondansetron (ZOFRAN) IV  Allergies:   No Known Allergies  Social History:   Social History   Socioeconomic History   Marital status: Widowed    Spouse name: Not on file   Number of children: 1   Years of education: 37   Highest education level: Not on file  Occupational History   Occupation: Retired  Tobacco Use   Smoking status: Former    Packs/day: 0.50    Years: 4.00    Total pack years: 2.00    Types: Cigarettes    Quit date: 10/29/1974    Years since quitting: 47.8   Smokeless tobacco: Never  Vaping Use   Vaping Use: Never used  Substance and Sexual Activity   Alcohol use: No   Drug use: No   Sexual activity: Not Currently  Other Topics Concern   Not on file  Social History Narrative   Born and raised in [place], [place]. Currently reside in a private residence by himself. Daughter lives in the area. No live. Fun: hunt and fish   Denies religious beliefs that would effect health care.    Social Determinants of Health   Financial Resource Strain: Low Risk   (03/26/2022)   Overall Financial Resource Strain (CARDIA)    Difficulty of Paying Living Expenses: Not hard at all  Food Insecurity: No Food Insecurity (03/26/2022)   Hunger Vital Sign    Worried About Running Out of Food in the Last Year: Never true    Ran Out of Food in the Last Year: Never true  Transportation Needs: No Transportation Needs (03/26/2022)   PRAPARE - Hydrologist (Medical): No    Lack of Transportation (Non-Medical): No  Physical Activity: Inactive (03/26/2022)   Exercise Vital Sign    Days of Exercise per Week: 0 days    Minutes of Exercise per Session: 0 min  Stress: No Stress Concern Present (03/26/2022)   [place]    Feeling of Stress : Not at all  Social Connections: Unknown (05/28/2019)   Social Connection and Isolation Panel [NHANES]    Frequency of Communication with Friends and Family: More than three times a week    Frequency of Social Gatherings with Friends and Family: More than three times a week    Attends Religious Services: Not on file    Active Member of Clubs or Organizations: Yes    Attends Archivist Meetings: More than 4 times per year    Marital Status: Widowed  Intimate Partner Violence: Not At Risk (03/26/2022)   Humiliation, Afraid, Rape, and Kick questionnaire    Fear of Current or Ex-Partner: No    Emotionally Abused: No    Physically Abused: No    Sexually Abused: No    Family History:   Family History  Problem Relation Age of Onset   Diabetes Father    Diabetes Paternal Grandfather      ROS:  Please see the history of present illness.  All other ROS reviewed and negative.      Physical Exam/Data:   Vitals:   08/14/22 1715 08/14/22 2040 08/15/22 0150 08/15/22 0602  BP: (!) 153/68 136/60 (!) 146/63 (!) 160/75  Pulse: 62 (!) 59 (!) 55 (!) 53  Resp: _0 Temp: 98.9 F (37.2 C) 98.2 F (36.8 C) 98.2 F (36.8 C) 98.6  F (37 C)  TempSrc: Oral Oral Oral Oral  SpO2: 100% 97% 98%  98%  Weight:      Height:        Intake/Output Summary (Last 24 hours) at 08/15/2022 0752 Last data filed at 08/15/2022 0502 Gross per 24 hour  Intake 1153.95 ml  Output 275 ml  Net 878.95 ml      08/14/2022    4:54 PM 08/13/2022   12:01 PM 08/01/2022    3:38 PM  Last 3 Weights  Weight (lbs) 183 lb 3.2 oz 195 lb 195 lb 15.8 oz  Weight (kg) 83.1 kg 88.451 kg 88.9 kg     Body mass index is 31.45 kg/m.  General: 82 y.o. male resting comfortably in no acute distress. HEENT: Normocephalic and atraumatic. Sclera clear.  Neck: Supple. No carotid bruits. No JVD. Heart: Bradycardic with irregular rhythm.  Distinct S1 and S2. No murmurs, gallops, or rubs. Radial pulses 2+ and equal bilaterally. Lungs: No increased work of breathing. Clear to ausculation bilaterally. No wheezes, rhonchi, or rales.  Abdomen: Soft, non-distended, and non-tender to palpation. Bowel sounds present. Extremities: Trace to 1+ pitting edema of bilateral lower extremities (left leg chronically larger than the right). Skin: Warm and dry. Neuro: Alert and oriented x3. No focal deficits. Psych: Normal affect. Responds appropriately.   EKG:  The EKG was personally reviewed and demonstrates:  Complete heart block with rate of 65 bpm. No acute ST/T changes compared to prior tracing.  Telemetry:  Telemetry was personally reviewed and demonstrates:  Complete heart block with rates ranging from the 40s to the 60s.  Relevant CV Studies:  Echocardiogram 01/03/2022: Impressions: 1. Left ventricular ejection fraction, by estimation, is 55 to 60%. The  left ventricle has normal function. Left ventricular endocardial border  not optimally defined to evaluate regional wall motion. Appears grossly  normal. There is mild-to-moderate  hypertrophy of the basal septum. The rest of the LV segments demonstrate  mild left ventricular hypertrophy. Left ventricular diastolic  parameters  are consistent with Grade I diastolic dysfunction (impaired relaxation).   2. Right ventricular systolic function is normal. The right ventricular  size is normal.   3. The mitral valve is normal in structure. No evidence of mitral valve  regurgitation. No evidence of mitral stenosis.   4. The aortic valve is tricuspid. There is mild calcification of the  aortic valve. There is mild thickening of the aortic valve. Aortic valve  regurgitation is not visualized. Aortic valve sclerosis/calcification is  present, without any evidence of  aortic stenosis.   5. The inferior vena cava is normal in size with greater than 50%  respiratory variability, suggesting right atrial pressure of 3 mmHg.   Comparison(s): Compared to prior echo report in 09/2020, there is no  significant change.  _______________  Monitor 01/22/2022 to 02/02/2022: Patient had a min HR of 38 bpm (second degree AV block type 1), max HR of 132 bpm (supraventricular tachycardia), and avg HR of 64 bpm (normal sinus rhythm). Predominant underlying rhythm was Sinus Rhythm. 1 run of Supraventricular Tachycardia occurred lasting 10 beats with a max rate of 132 bpm (avg 122 bpm). Second Degree AV Block-Mobitz I (Wenckebach) was present. Isolated SVEs were rare (<1.0%), and no SVE Couplets or SVE Triplets were present. Isolated VEs were frequent (13.3%, [id number]), VE Couplets were rare (<1.0%, 2005), and VE Triplets were rare (<1.0%, 57). Ventricular Bigeminy and Trigeminy were present.   Impression: 1. No significant arrhythmias.  2. 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 No complete heart block.  3. 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  Laboratory Data:  High Sensitivity Troponin:   Recent Labs  Lab 07/31/22 2013 08/14/22 1749 08/14/22 1817  TROPONINIHS 25* 26* 23*     Chemistry Recent Labs  Lab 08/14/22 0122 08/14/22 1749  NA 137  --   K 3.8  --   CL 102  --   CO2 25  --   GLUCOSE 116*  --   BUN  11  --   CREATININE 1.07  --   CALCIUM 9.3  --   MG  --  1.8  GFRNONAA >60  --   ANIONGAP 10  --     Recent Labs  Lab 08/14/22 0122  PROT 7.4  ALBUMIN 3.1*  AST 16  ALT 11  ALKPHOS 87  BILITOT 1.6*   Lipids No results for input(s): "CHOL", "TRIG", "HDL", "LABVLDL", "LDLCALC", "CHOLHDL" in the last 168 hours.  Hematology Recent Labs  Lab 08/14/22 0122 08/15/22 0553  WBC 19.9* 12.8*  RBC 5.47 4.69  HGB 15.0 12.9*  HCT 45.8 39.6  MCV 83.7 84.4  MCH 27.4 27.5  MCHC 32.8 32.6  RDW 14.2 14.4  PLT 334 302   Thyroid No results for input(s): "TSH", "FREET4" in the last 168 hours.  BNPNo results for input(s): "BNP", "PROBNP" in the last 168 hours.  DDimer No results for input(s): "DDIMER" in the last 168 hours.   Radiology/Studies:  US Abdomen Limited RUQ (LIVER/GB)  Result Date: 08/14/2022 CLINICAL DATA:  Right upper quadrant pain and vomiting EXAM: ULTRASOUND ABDOMEN LIMITED RIGHT UPPER QUADRANT COMPARISON:  CT of earlier today FINDINGS: Gallbladder: Small gallstones. Wall thickening at 5 mm. Trace pericholecystic edema and fluid, including on CT. The technologist describes tenderness with gallbladder palpation. Common bile duct: Diameter: Normal, 5 mm Liver: No focal lesion identified. Within normal limits in parenchymal echogenicity. Portal vein is patent on color Doppler imaging with normal direction of blood flow towards the liver. Other: None. IMPRESSION: Cholelithiasis with findings suspicious for acute cholecystitis. Electronically Signed   By: Abigail Miyamoto M.D.   On: 08/14/2022 10:43   CT ABDOMEN PELVIS W CONTRAST  Result Date: 08/14/2022 CLINICAL DATA:  Abdominal pain, constipation, and vomiting. EXAM: CT ABDOMEN AND PELVIS WITH CONTRAST TECHNIQUE: Multidetector CT imaging of the abdomen and pelvis was performed using the standard protocol following bolus administration of intravenous contrast. RADIATION DOSE REDUCTION: This exam was performed according to the  departmental dose-optimization program which includes automated exposure control, adjustment of the mA and/or kV according to patient size and/or use of iterative reconstruction technique. CONTRAST:  122m OMNIPAQUE IOHEXOL 300 MG/ML  SOLN COMPARISON:  None Available. FINDINGS: Lower chest: The heart is enlarged and a few coronary artery calcifications are noted. Mild atelectasis or scarring is present at the lung bases. Hepatobiliary: No focal liver abnormality. No biliary ductal dilatation. Stones are present within the gallbladder. There is gallbladder wall thickening with pericholecystic edema and fat stranding Pancreas: Pancreatic atrophy. No pancreatic ductal dilatation or surrounding inflammatory changes. Spleen: Normal in size without focal abnormality. Adrenals/Urinary Tract: No adrenal nodule or mass. The kidneys enhance symmetrically. A large cyst with thin rim calcification is noted in the lower pole the right kidney measuring 9.8 cm. No renal calculus or hydronephrosis. The bladder is unremarkable. Stomach/Bowel: There is thickening of the walls of the distal esophagus and a small hiatal hernia is noted. The stomach is otherwise within normal limits. No bowel obstruction, free air, or pneumatosis. The appendix is not visualized on exam, however there are no inflammatory changes in the  right lower quadrant. Vascular/Lymphatic: Aortic atherosclerosis. No abdominal or pelvic lymphadenopathy by size criteria. Reproductive: Metallic densities are present in the prostate gland, possibly representing radiation therapy seeds. Other: Fat containing left inguinal and umbilical hernias are noted. No ascites. Musculoskeletal: Increased density is noted in the retroareolar regions bilaterally, greater on the right than on the left which may be due to patient positioning, possible gynecomastia. Degenerative changes are present in the thoracolumbar spine. No acute osseous abnormality. IMPRESSION: 1. Cholelithiasis  with gallbladder wall thickening and surrounding inflammatory changes, possible acute cholecystitis. Ultrasound is recommended for further evaluation. 2. Thickening of the walls of the distal esophagus with small hiatal hernia. Endoscopy is suggested for further evaluation on follow-up. 3. Right renal cyst. 4. Aortic atherosclerosis. Electronically Signed   By: Brett Fairy M.D.   On: 08/14/2022 04:55     Assessment and Plan:   Complete Heart Block Pre-Op Evaluation Patient has a history of syncope in the setting of complete heart block secondary to severe electrolyte derangement.  He was recently admitted earlier this month for this.  Electrolytes were repleted and heart rates improved but he remained in complete heart block.  Given normal ventricular rate, no PPM was recommended.  He was recently seen in the EP clinic at which time he reported intermittent dizziness, lightheadedness at home.  He was still complete heart block but rates were in the 70s.  Therefore, outpatient monitor was recommended to see if symptoms correlated with heart rate dysfunction.  He now presents with acute cholecystitis.  He remains in complete heart block with controlled ventricular rate.  Electrolytes within normal limits. General Surgery has been consulted and is considering laparoscopic cholecystectomy versus percutaneous drain would like her input given ongoing complete heart block. Patient is mostly asymptomatic with the CHB. He notes occasional very mild episodes of lightheadedness/dizziness but nothing significant. No recurrent syncope since last admission. No angina or acute CHF symptoms. Per Revised Cardiac Risk Index, considered moderate risk with 6.6% chance of adverse outcome given insulin dependent diabetes and potential and intraperitoneal surgery. However, this does not take into account patient's CHB. He is not a good PPM candidate right now given ongoing infection. Safest option may be to treat with  percutaneous drain and then have patient follow-up with EP afterwards to determined if PPM is warranted prior to lap chole. However, I will discuss with Dr. Gardiner Rhyme who will see the patient later today.  Elevated Troponin High-sensitivity troponin minimally elevated and flat in the 20s which is were it was during recent admission earlier this month. EKG shows no acute ischemic changes compared to prior tracings. No chest pain. Not consistent with ACS. Suspect due to underlying illnesses. No ischemic work-up planned at this time.  Hypertension BP elevated. Oral antihypertensive on hold as patient is NPO. Continue to use PRN Hydralazine. Restart oral medications when OK with General Surgery.  Otherwise, per primary team: - Acute cholecystitis - Type 2 diabetes mellitus   Risk Assessment/Risk Scores:   N/A  For questions or updates, please contact [facility] Please consult [email or web address] for contact info under    Signed, Darreld Mclean, PA-C  08/15/2022 7:52 AM   Patient seen and examined.  Agree with above documentation.  Andre Stout is an 82 year old male with a history of complete heart block, T2DM, hypertension, prostate cancer who we are consulted for evaluation of complete heart block at the request of Dr. Roosevelt Locks.  He was admitted in January 2023 with syncope and found to have  complete heart block.  Hypokalemic at that time, with K as low as 2.2 and magnesium 1.4.  Complete heart block resolved with correction of electrolytes.  Echocardiogram during that admission showed normal biventricular function and no significant valvular disease.  Evaluated by EP in pacemaker was felt not necessary at that time.  He was admitted earlier this month with another syncopal episode and found to be in complete heart block but again had significant electrolyte abnormalities with potassium 2.7 and magnesium 1.5.  He was seen in EP clinic on 8/14.  EKG at that time showed complete heart block with  junctional escape at 72 bpm.  He reported episodes of lightheadedness and plan was to have him wear a 2-week monitor to see if symptoms correlated with complete heart block.  He presented to the ED on 8/15 with right upper quadrant pain and vomiting.  Initial vital signs notable for BP 190/79, SPO2 97% on room air, pulse 74.  Labs notable for creatinine 1.07, AST 16 ALT 11 albumin 3.1 alk phos 87 T. bili 1.6, WBC 19.9, hemoglobin 15.0, troponin 26 > 23.  Right upper quadrant ultrasound showed acute cholecystitis.  EKG shows complete heart block with junctional escape rhythm with rate 65, LVH.  Telemetry shows complete heart block with junctional escape rhythm with rate 50s.  General surgery consulted, and considering cholecystectomy is percutaneous drain.  On exam, patient is alert and oriented, bradycardic, regular, no murmurs, lungs CTAB, 1+ LE edema.  Complicated situation.  He is in complete heart block with a good junctional escape rhythm and appears asymptomatic.  However, would worry about decompensation if given general anesthesia for cholecystectomy.  Options could include placing a TVP and undergoing cholecystectomy. or proceeding with percutaneous cholecystostomy tubes and then following up for PPM once infection is treated.  In speaking with the surgery, it is possible that he will not need a cholecystectomy or cholecystostomy tubes if he continues to improve with antibiotics.  If this is the case, then could treat infection with antibiotics and once no longer infected, plan for elective PPM as outpatient, and then could undergo cholecystectomy as needed.  Recommend that we go ahead and transfer to [facility] for EP evaluation in case pacing is needed.  I have spoken with EP, will see when arrives to [facility].  Donato Heinz, MD

## 2022-08-15 NOTE — Progress Notes (Signed)
Progress Note     Subjective: Hungry and having mild nausea. No abdominal pain. Nausea improved from yesterday and no emesis. Last BM yesterday  Grandson is bedside  Objective: Vital signs in last 24 hours: Temp:  [97.9 F (36.6 C)-98.9 F (37.2 C)] 98.6 F (37 C) (08/16 0602) Pulse Rate:  [53-66] 53 (08/16 0602) Resp:  [14-20] 14 (08/16 0602) BP: (136-171)/(60-99) 160/75 (08/16 0602) SpO2:  [93 %-100 %] 98 % (08/16 0602) Weight:  [83.1 kg] 83.1 kg (08/15 1654) Last BM Date :  (pta)  Intake/Output from previous day: 08/15 0701 - 08/16 0700 In: 1154 [I.V.:64.7; IV Piggyback:1089.2] Out: 275 [Urine:275] Intake/Output this shift: Total I/O In: 1400 [I.V.:1400] Out: -   PE: General: pleasant, WD, male who is laying in bed in NAD HEENT: head is normocephalic, atraumatic. Mouth is pink and moist Heart: bradycardic Lungs: CTAB, no wheezes, rhonchi, or rales noted.  Respiratory effort nonlabored Abd: soft, mildly distended. No TTP.  MSK: all 4 extremities are symmetrical with no cyanosis, clubbing, or edema. Skin: warm and dry Psych: A&Ox3 with an appropriate affect.    Lab Results:  Recent Labs    08/14/22 0122 08/15/22 0553  WBC 19.9* 12.8*  HGB 15.0 12.9*  HCT 45.8 39.6  PLT 334 302   BMET Recent Labs    08/14/22 0122 08/15/22 0553  NA 137 140  K 3.8 3.5  CL 102 106  CO2 25 27  GLUCOSE 116* 84  BUN 11 15  CREATININE 1.07 1.09  CALCIUM 9.3 8.5*   PT/INR Recent Labs    08/14/22 1200  LABPROT 16.0*  INR 1.3*   CMP     Component Value Date/Time   NA 140 08/15/2022 0553   K 3.5 08/15/2022 0553   CL 106 08/15/2022 0553   CO2 27 08/15/2022 0553   GLUCOSE 84 08/15/2022 0553   BUN 15 08/15/2022 0553   CREATININE 1.09 08/15/2022 0553   CREATININE 1.11 01/12/2022 1541   CALCIUM 8.5 (L) 08/15/2022 0553   PROT 6.4 (L) 08/15/2022 0553   ALBUMIN 2.6 (L) 08/15/2022 0553   AST 10 (L) 08/15/2022 0553   ALT 11 08/15/2022 0553   ALKPHOS 66 08/15/2022  0553   BILITOT 1.6 (H) 08/15/2022 0553   GFRNONAA >60 08/15/2022 0553   GFRAA >60 03/07/2016 0337   Lipase     Component Value Date/Time   LIPASE 22 03/04/2016 1149       Studies/Results: US Abdomen Limited RUQ (LIVER/GB)  Result Date: 08/14/2022 CLINICAL DATA:  Right upper quadrant pain and vomiting EXAM: ULTRASOUND ABDOMEN LIMITED RIGHT UPPER QUADRANT COMPARISON:  CT of earlier today FINDINGS: Gallbladder: Small gallstones. Wall thickening at 5 mm. Trace pericholecystic edema and fluid, including on CT. The technologist describes tenderness with gallbladder palpation. Common bile duct: Diameter: Normal, 5 mm Liver: No focal lesion identified. Within normal limits in parenchymal echogenicity. Portal vein is patent on color Doppler imaging with normal direction of blood flow towards the liver. Other: None. IMPRESSION: Cholelithiasis with findings suspicious for acute cholecystitis. Electronically Signed   By: Abigail Miyamoto M.D.   On: 08/14/2022 10:43   CT ABDOMEN PELVIS W CONTRAST  Result Date: 08/14/2022 CLINICAL DATA:  Abdominal pain, constipation, and vomiting. EXAM: CT ABDOMEN AND PELVIS WITH CONTRAST TECHNIQUE: Multidetector CT imaging of the abdomen and pelvis was performed using the standard protocol following bolus administration of intravenous contrast. RADIATION DOSE REDUCTION: This exam was performed according to the departmental dose-optimization program which includes automated exposure control,  adjustment of the mA and/or kV according to patient size and/or use of iterative reconstruction technique. CONTRAST:  160m OMNIPAQUE IOHEXOL 300 MG/ML  SOLN COMPARISON:  None Available. FINDINGS: Lower chest: The heart is enlarged and a few coronary artery calcifications are noted. Mild atelectasis or scarring is present at the lung bases. Hepatobiliary: No focal liver abnormality. No biliary ductal dilatation. Stones are present within the gallbladder. There is gallbladder wall thickening  with pericholecystic edema and fat stranding Pancreas: Pancreatic atrophy. No pancreatic ductal dilatation or surrounding inflammatory changes. Spleen: Normal in size without focal abnormality. Adrenals/Urinary Tract: No adrenal nodule or mass. The kidneys enhance symmetrically. A large cyst with thin rim calcification is noted in the lower pole the right kidney measuring 9.8 cm. No renal calculus or hydronephrosis. The bladder is unremarkable. Stomach/Bowel: There is thickening of the walls of the distal esophagus and a small hiatal hernia is noted. The stomach is otherwise within normal limits. No bowel obstruction, free air, or pneumatosis. The appendix is not visualized on exam, however there are no inflammatory changes in the right lower quadrant. Vascular/Lymphatic: Aortic atherosclerosis. No abdominal or pelvic lymphadenopathy by size criteria. Reproductive: Metallic densities are present in the prostate gland, possibly representing radiation therapy seeds. Other: Fat containing left inguinal and umbilical hernias are noted. No ascites. Musculoskeletal: Increased density is noted in the retroareolar regions bilaterally, greater on the right than on the left which may be due to patient positioning, possible gynecomastia. Degenerative changes are present in the thoracolumbar spine. No acute osseous abnormality. IMPRESSION: 1. Cholelithiasis with gallbladder wall thickening and surrounding inflammatory changes, possible acute cholecystitis. Ultrasound is recommended for further evaluation. 2. Thickening of the walls of the distal esophagus with small hiatal hernia. Endoscopy is suggested for further evaluation on follow-up. 3. Right renal cyst. 4. Aortic atherosclerosis. Electronically Signed   By: LBrett FairyM.D.   On: 08/14/2022 04:55    Anti-infectives: Anti-infectives (From admission, onward)    Start     Dose/Rate Route Frequency Ordered Stop   08/14/22 2000  piperacillin-tazobactam (ZOSYN) IVPB  3.375 g        3.375 g 12.5 mL/hr over 240 Minutes Intravenous Every 8 hours 08/14/22 1516     08/14/22 1145  piperacillin-tazobactam (ZOSYN) IVPB 3.375 g        3.375 g 12.5 mL/hr over 240 Minutes Intravenous  Once 08/14/22 1138 08/14/22 1632        Assessment/Plan Cholecystitis - cont IV abx - WBC improved 12.8 (19.9) - t bili stable at 1.6 - do not recommend general anesthesia/lap chole in setting of heart block. Await cardiology evaluation and consider perc chole with possible interval cholecystectomy when cardiac issues improved - continue NPO pending cards eval and possible IR intervention    FEN - NPO/IVFs VTE - lovenox ID - [email or web address]   Complete heart block HTN DM H/o prostate cancer  I reviewed hospitalist notes, last 24 h vitals and pain scores, last 24 h labs and trends, and last 24 h imaging results.  This care required moderate level of medical decision making.    LOS: 1 day   [facility] 08/15/2022, 8:47 AM Please see Amion for pager number during day hours 7:00am-4:30pm

## 2022-08-15 NOTE — Plan of Care (Signed)
Hypoglycemic Event  CBG: 63  Treatment: D50 25 mL (12.5 gm)  Symptoms: None  Follow-up CBG: Time:1243 CBG Result:114  Possible Reasons for Event: Medication regimen: , and Change in activity  Comments/MD notified: [place]     Problem: Education: Goal: Ability to describe self-care measures that may prevent or decrease complications (Diabetes Survival Skills Education) will improve Outcome: Progressing Goal: Individualized Educational Video(s) Outcome: Progressing   Problem: Coping: Goal: Ability to adjust to condition or change in health will improve Outcome: Progressing   Problem: Fluid Volume: Goal: Ability to maintain a balanced intake and output will improve Outcome: Progressing   Problem: Health Behavior/Discharge Planning: Goal: Ability to identify and utilize available resources and services will improve Outcome: Progressing Goal: Ability to manage health-related needs will improve Outcome: Progressing   Problem: Metabolic: Goal: Ability to maintain appropriate glucose levels will improve Outcome: Progressing   Problem: Nutritional: Goal: Maintenance of adequate nutrition will improve Outcome: Progressing Goal: Progress toward achieving an optimal weight will improve Outcome: Progressing   Problem: Skin Integrity: Goal: Risk for impaired skin integrity will decrease Outcome: Progressing   Problem: Tissue Perfusion: Goal: Adequacy of tissue perfusion will improve Outcome: Progressing   Problem: Education: Goal: Knowledge of General Education information will improve Description: Including pain rating scale, medication(s)/side effects and non-pharmacologic comfort measures Outcome: Progressing   Problem: Health Behavior/Discharge Planning: Goal: Ability to manage health-related needs will improve Outcome: Progressing   Problem: Clinical Measurements: Goal: Ability to maintain clinical measurements within normal limits will  improve Outcome: Progressing Goal: Will remain free from infection Outcome: Progressing Goal: Diagnostic test results will improve Outcome: Progressing Goal: Respiratory complications will improve Outcome: Progressing Goal: Cardiovascular complication will be avoided Outcome: Progressing   Problem: Activity: Goal: Risk for activity intolerance will decrease Outcome: Progressing   Problem: Nutrition: Goal: Adequate nutrition will be maintained Outcome: Progressing   Problem: Coping: Goal: Level of anxiety will decrease Outcome: Progressing   Problem: Elimination: Goal: Will not experience complications related to bowel motility Outcome: Progressing Goal: Will not experience complications related to urinary retention Outcome: Progressing   Problem: Pain Managment: Goal: General experience of comfort will improve Outcome: Progressing   Problem: Safety: Goal: Ability to remain free from injury will improve Outcome: Progressing   Problem: Skin Integrity: Goal: Risk for impaired skin integrity will decrease Outcome: Progressing

## 2022-08-15 NOTE — Progress Notes (Addendum)
PROGRESS NOTE    Andre Stout  EXB:[id number] DOB: 01-Aug-1940 DOA: 08/14/2022 PCP: [name], FNP    Brief Narrative:   Andre Stout is a 82 y.o. male with past medical history significant for HTN, prostate cancer, insulin-dependent diabetes mellitus, high-grade AV block, who presented to [facility] ED on 8/15 with abdominal pain.  Patient reports right upper quadrant abdominal pain associated with nausea/vomiting, lightheadedness that has progressed over the last 3 days.  Worse after eating.  Reports chills but denies fevers.  In the ED, temperature 99.6 F, BP 190/79, SPO2 97% on room air, HR 74, RR 18.  WBC 19.9, hemoglobin 15.0, platelets 334.  Sodium 137, potassium 3.8, chloride 102, CO2 25, glucose 116, BUN 11, creatinine 1.07.  AST 16, ALT 11, total bilirubin 1.6.  High-sensitivity troponin 26>23. Urinalysis with negative leukocytes, negative nitrite, no bacteria, 0-5 WBCs.  CT abdomen/pelvis with cholelithiasis with gallbladder wall thickening and surrounding inflammatory changes consistent with possible acute cholecystitis, thickening of the walls of the distal esophagus with small hiatal hernia.  Right upper quadrant ultrasound with cholelithiasis with findings suspicious for acute cholecystitis with tenderness on gallbladder palpation per sonographer.  General surgery was consulted.  TRH consulted for further evaluation and management of acute cholecystitis.  Assessment & Plan:   Cholecystitis, acute Presenting with progressive abdominal pain associate with nausea and vomiting, localized to the right upper quadrant.  CT abdomen/pelvis and right upper quadrant ultrasound consistent with acute cholecystitis. --General surgery following, appreciate assistance --WBC 19.9>12.8 --N.p.o. --D5 NS w/ 20 mEq KCL at 50 mL/h --Continue antibiotics with Zosyn --Repeat CBC in a.m. --Given patient's complete heart block, general surgery deferring laparoscopic cholecystectomy for now and  may consider cholecystostomy tube versus just management with antibiotics given his improvement so far.  Complete heart block Patient recently referred to electrophysiology outpatient.  On arrival, EKG consistent with complete heart block; although asymptomatic.  General surgery concerned about decompensation if given general anesthesia for cholecystectomy.  Cardiology was consulted and followed during his hospitalization.  Options could include placement of temporary venous pacing to undergo cholecystectomy versus likely plan for percutaneous cholecystostomy tube placement in which she can follow-up for PPM once his infection clears.  Given his current infection improvement, general surgery believes may be able to mnage with antibiotics alone. --Transferring to [facility] for EP evaluation per recommendations from cardiology, Dr. Alda Lea --Continue to monitor on telemetry  Elevated troponin Troponin elevated at 26 followed by 23, flat.  EKG with no acute ischemic changes.  Patient denies chest pain, not consistent with ACS and likely secondary to underlying illness/infection due to type II demand ischemia. --Continue monitor on telemetry  Type 2 diabetes mellitus, with hyperglycemia Hemoglobin A1c 8.3 on 08/02/2022, not optimally controlled.  Home regimen includes NPH 36 units BID AC --Holding long-acting insulin while n.p.o. --SSI for coverage --CBGs q4h while NPO  Essential hypertension Currently off of antihypertensives outpatient. --Hydralazine 5 mg IV q6h PRN SBP >150 or DBP >100    DVT prophylaxis: enoxaparin (LOVENOX) injection 40 mg Start: 08/14/22 1800    Code Status: Full Code Family Communication: Updated patient's grandson present at bedside this morning  Disposition Plan:  Level of care: Telemetry Cardiac Status is: Inpatient Remains inpatient appropriate because: Transferring to [facility] for EP evaluation, continues on IV antibiotics, awaiting general surgery  sign off, may require percutaneous cholecystostomy    Consultants:  General surgery Cardiology, Dr. Alda Lea  Procedures:  None  Antimicrobials:  Zosyn 8/15  Subjective: Patient seen examined bedside, resting comfortably.  Lying in bed.  Grandson present.  No complaints this morning.  Did have episode of slight hypoglycemia in which she received amp of D50.  Discussed with cardiology, Dr. Alda Lea who requested transfer to [facility] for EP evaluation given his history of complete heart block; and may require surgical versus interventional radiology procedure for his acute cholecystitis.  Also discussed with general surgery.  Patient with no other complaints or concerns at this time.  Denies headache, no dizziness, no visual changes, no chest pain, palpitations, no shortness of breath, no current abdominal pain, no cough/congestion, no current fever/chills/night sweats, no nausea/vomiting/diarrhea, no focal weakness, no fatigue, no paresthesias.  No acute events overnight per nursing staff.  Objective: Vitals:   08/14/22 2040 08/15/22 0150 08/15/22 0602 08/15/22 1129  BP: 136/60 (!) 146/63 (!) 160/75 (!) 148/62  Pulse: (!) 59 (!) 55 (!) 53 60  Resp: '14 14 14 18  '$ Temp: 98.2 F (36.8 C) 98.2 F (36.8 C) 98.6 F (37 C) 98.1 F (36.7 C)  TempSrc: Oral Oral Oral Oral  SpO2: 97% 98% 98% 99%  Weight:      Height:        Intake/Output Summary (Last 24 hours) at 08/15/2022 1506 Last data filed at 08/15/2022 0803 Gross per 24 hour  Intake 2553.95 ml  Output 275 ml  Net 2278.95 ml   Filed Weights   08/14/22 1654  Weight: 83.1 kg    Examination:  Physical Exam: GEN: NAD, alert and oriented x 3, elderly in appearance HEENT: NCAT, PERRL, EOMI, sclera clear, MMM PULM: CTAB w/o wheezes/crackles, normal respiratory effort CV: Bradycardic, regular rhythm w/o M/G/R GI: abd soft, NTND, NABS, no R/G/M MSK: no peripheral edema, muscle strength globally intact 5/5 bilateral upper/lower  extremities NEURO: CN II-XII intact, no focal deficits, sensation to light touch intact PSYCH: normal mood/affect Integumentary: dry/intact, no rashes or wounds    Data Reviewed: I have personally reviewed following labs and imaging studies  CBC: Recent Labs  Lab 08/14/22 0122 08/15/22 0553  WBC 19.9* 12.8*  NEUTROABS 16.1*  --   HGB 15.0 12.9*  HCT 45.8 39.6  MCV 83.7 84.4  PLT 334 329   Basic Metabolic Panel: Recent Labs  Lab 08/14/22 0122 08/14/22 1749 08/15/22 0553  NA 137  --  140  K 3.8  --  3.5  CL 102  --  106  CO2 25  --  27  GLUCOSE 116*  --  84  BUN 11  --  15  CREATININE 1.07  --  1.09  CALCIUM 9.3  --  8.5*  MG  --  1.8  --   PHOS  --  3.0  --    GFR: Estimated Creatinine Clearance: 51.7 mL/min (by C-G formula based on SCr of 1.09 mg/dL). Liver Function Tests: Recent Labs  Lab 08/14/22 0122 08/15/22 0553  AST 16 10*  ALT 11 11  ALKPHOS 87 66  BILITOT 1.6* 1.6*  PROT 7.4 6.4*  ALBUMIN 3.1* 2.6*   No results for input(s): "LIPASE", "AMYLASE" in the last 168 hours. No results for input(s): "AMMONIA" in the last 168 hours. Coagulation Profile: Recent Labs  Lab 08/14/22 1200  INR 1.3*   Cardiac Enzymes: No results for input(s): "CKTOTAL", "CKMB", "CKMBINDEX", "TROPONINI" in the last 168 hours. BNP (last 3 results) No results for input(s): "PROBNP" in the last 8760 hours. HbA1C: No results for input(s): "HGBA1C" in the last 72 hours. CBG: Recent Labs  Lab 08/14/22 1831 08/14/22 2040 08/15/22 0736 08/15/22 1126 08/15/22 1243  GLUCAP 478* 305* 70 63* 114*   Lipid Profile: No results for input(s): "CHOL", "HDL", "LDLCALC", "TRIG", "CHOLHDL", "LDLDIRECT" in the last 72 hours. Thyroid Function Tests: No results for input(s): "TSH", "T4TOTAL", "FREET4", "T3FREE", "THYROIDAB" in the last 72 hours. Anemia Panel: No results for input(s): "VITAMINB12", "FOLATE", "FERRITIN", "TIBC", "IRON", "RETICCTPCT" in the last 72 hours. Sepsis  Labs: No results for input(s): "PROCALCITON", "LATICACIDVEN" in the last 168 hours.  No results found for this or any previous visit (from the past 240 hour(s)).       Radiology Studies: US Abdomen Limited RUQ (LIVER/GB)  Result Date: 08/14/2022 CLINICAL DATA:  Right upper quadrant pain and vomiting EXAM: ULTRASOUND ABDOMEN LIMITED RIGHT UPPER QUADRANT COMPARISON:  CT of earlier today FINDINGS: Gallbladder: Small gallstones. Wall thickening at 5 mm. Trace pericholecystic edema and fluid, including on CT. The technologist describes tenderness with gallbladder palpation. Common bile duct: Diameter: Normal, 5 mm Liver: No focal lesion identified. Within normal limits in parenchymal echogenicity. Portal vein is patent on color Doppler imaging with normal direction of blood flow towards the liver. Other: None. IMPRESSION: Cholelithiasis with findings suspicious for acute cholecystitis. Electronically Signed   By: Abigail Miyamoto M.D.   On: 08/14/2022 10:43   CT ABDOMEN PELVIS W CONTRAST  Result Date: 08/14/2022 CLINICAL DATA:  Abdominal pain, constipation, and vomiting. EXAM: CT ABDOMEN AND PELVIS WITH CONTRAST TECHNIQUE: Multidetector CT imaging of the abdomen and pelvis was performed using the standard protocol following bolus administration of intravenous contrast. RADIATION DOSE REDUCTION: This exam was performed according to the departmental dose-optimization program which includes automated exposure control, adjustment of the mA and/or kV according to patient size and/or use of iterative reconstruction technique. CONTRAST:  134m OMNIPAQUE IOHEXOL 300 MG/ML  SOLN COMPARISON:  None Available. FINDINGS: Lower chest: The heart is enlarged and a few coronary artery calcifications are noted. Mild atelectasis or scarring is present at the lung bases. Hepatobiliary: No focal liver abnormality. No biliary ductal dilatation. Stones are present within the gallbladder. There is gallbladder wall thickening with  pericholecystic edema and fat stranding Pancreas: Pancreatic atrophy. No pancreatic ductal dilatation or surrounding inflammatory changes. Spleen: Normal in size without focal abnormality. Adrenals/Urinary Tract: No adrenal nodule or mass. The kidneys enhance symmetrically. A large cyst with thin rim calcification is noted in the lower pole the right kidney measuring 9.8 cm. No renal calculus or hydronephrosis. The bladder is unremarkable. Stomach/Bowel: There is thickening of the walls of the distal esophagus and a small hiatal hernia is noted. The stomach is otherwise within normal limits. No bowel obstruction, free air, or pneumatosis. The appendix is not visualized on exam, however there are no inflammatory changes in the right lower quadrant. Vascular/Lymphatic: Aortic atherosclerosis. No abdominal or pelvic lymphadenopathy by size criteria. Reproductive: Metallic densities are present in the prostate gland, possibly representing radiation therapy seeds. Other: Fat containing left inguinal and umbilical hernias are noted. No ascites. Musculoskeletal: Increased density is noted in the retroareolar regions bilaterally, greater on the right than on the left which may be due to patient positioning, possible gynecomastia. Degenerative changes are present in the thoracolumbar spine. No acute osseous abnormality. IMPRESSION: 1. Cholelithiasis with gallbladder wall thickening and surrounding inflammatory changes, possible acute cholecystitis. Ultrasound is recommended for further evaluation. 2. Thickening of the walls of the distal esophagus with small hiatal hernia. Endoscopy is suggested for further evaluation on follow-up. 3. Right renal cyst. 4. Aortic atherosclerosis. Electronically  Signed   By: Brett Fairy M.D.   On: 08/14/2022 04:55        Scheduled Meds:  aspirin  81 mg Oral QHS   brimonidine  1 drop Both Eyes BID WC   dorzolamide-timolol  1 drop Both Eyes BID WC   enoxaparin (LOVENOX) injection  40  mg Subcutaneous Q24H   insulin aspart  0-15 Units Subcutaneous TID WC   latanoprost  1 drop Both Eyes QHS   Continuous Infusions:  sodium chloride 100 mL/hr at 08/14/22 1528   piperacillin-tazobactam (ZOSYN)  IV 3.375 g (08/15/22 1204)     LOS: 1 day    Time spent: 52 minutes spent on chart review, discussion with nursing staff, consultants, updating family and interview/physical exam; more than 50% of that time was spent in counseling and/or coordination of care.    Doyal Saric J [place] DO [facility] Available via Epic secure chat 7am-7pm After these hours, please refer to coverage provider listed on [email or web address] 08/15/2022, 3:06 PM

## 2022-08-15 NOTE — Progress Notes (Signed)
Mobility Specialist - Progress Note     08/15/22 1423  Mobility  Activity Ambulated with assistance in hallway  Level of Assistance Contact guard assist, steadying assist  Assistive Device Front wheel walker  Distance Ambulated (ft) 50 ft  Activity Response Tolerated well  $Mobility charge 1 Mobility   Pt was found in bed and agreeable to mobilize. Pt ambulated in room and took a break in a chair. Pt then proceeded to ambulate in hallway and took 1 break. Returned to bed upon returning to room and left with all necessities in reach and family in room. RN was notified of session.  Ferd Hibbs Mobility Specialist

## 2022-08-16 ENCOUNTER — Inpatient Hospital Stay (HOSPITAL_COMMUNITY): Payer: Medicare Other

## 2022-08-16 DIAGNOSIS — I442 Atrioventricular block, complete: Secondary | ICD-10-CM

## 2022-08-16 DIAGNOSIS — K819 Cholecystitis, unspecified: Secondary | ICD-10-CM | POA: Diagnosis not present

## 2022-08-16 DIAGNOSIS — K810 Acute cholecystitis: Secondary | ICD-10-CM | POA: Diagnosis not present

## 2022-08-16 LAB — CBC
HCT: 39 % (ref 39.0–52.0)
Hemoglobin: 12.6 g/dL — ABNORMAL LOW (ref 13.0–17.0)
MCH: 27.7 pg (ref 26.0–34.0)
MCHC: 32.3 g/dL (ref 30.0–36.0)
MCV: 85.7 fL (ref 80.0–100.0)
Platelets: 327 10*3/uL (ref 150–400)
RBC: 4.55 MIL/uL (ref 4.22–5.81)
RDW: 14.3 % (ref 11.5–15.5)
WBC: 10.8 10*3/uL — ABNORMAL HIGH (ref 4.0–10.5)
nRBC: 0 % (ref 0.0–0.2)

## 2022-08-16 LAB — HEPATIC FUNCTION PANEL
ALT: 9 U/L (ref 0–44)
AST: 11 U/L — ABNORMAL LOW (ref 15–41)
Albumin: 2.7 g/dL — ABNORMAL LOW (ref 3.5–5.0)
Alkaline Phosphatase: 71 U/L (ref 38–126)
Bilirubin, Direct: 0.2 mg/dL (ref 0.0–0.2)
Indirect Bilirubin: 1.1 mg/dL — ABNORMAL HIGH (ref 0.3–0.9)
Total Bilirubin: 1.3 mg/dL — ABNORMAL HIGH (ref 0.3–1.2)
Total Protein: 6.5 g/dL (ref 6.5–8.1)

## 2022-08-16 LAB — GLUCOSE, CAPILLARY
Glucose-Capillary: 153 mg/dL — ABNORMAL HIGH (ref 70–99)
Glucose-Capillary: 119 mg/dL — ABNORMAL HIGH (ref 70–99)
Glucose-Capillary: 161 mg/dL — ABNORMAL HIGH (ref 70–99)
Glucose-Capillary: 169 mg/dL — ABNORMAL HIGH (ref 70–99)
Glucose-Capillary: 154 mg/dL — ABNORMAL HIGH (ref 70–99)

## 2022-08-16 LAB — ECHOCARDIOGRAM COMPLETE
Area-P 1/2: 3.85 cm2
Height: 64 in
S' Lateral: 2.5 cm
Weight: 2931.24 oz

## 2022-08-16 LAB — BASIC METABOLIC PANEL
Anion gap: 9 (ref 5–15)
BUN: 12 mg/dL (ref 8–23)
CO2: 23 mmol/L (ref 22–32)
Calcium: 8.5 mg/dL — ABNORMAL LOW (ref 8.9–10.3)
Chloride: 106 mmol/L (ref 98–111)
Creatinine, Ser: 1.15 mg/dL (ref 0.61–1.24)
GFR, Estimated: >60 mL/min (ref >60)
Glucose, Bld: 133 mg/dL — ABNORMAL HIGH (ref 70–99)
Potassium: 3.8 mmol/L (ref 3.5–5.1)
Sodium: 138 mmol/L (ref 135–145)

## 2022-08-16 LAB — MAGNESIUM: Magnesium: 1.9 mg/dL (ref 1.7–2.4)

## 2022-08-16 MED ORDER — HYDRALAZINE HCL 20 MG/ML IJ SOLN
10.0000 mg | Freq: Three times a day (TID) | INTRAMUSCULAR | Status: DC
  Administered 2022-08-16 – 2022-08-21 (×17): 10 mg via INTRAVENOUS
  Filled 2022-08-16 (×16): qty 1

## 2022-08-16 MED ORDER — PERFLUTREN LIPID MICROSPHERE
1.0000 mL | INTRAVENOUS | Status: AC | PRN
  Administered 2022-08-16: 1.5 mL via INTRAVENOUS

## 2022-08-16 NOTE — Progress Notes (Addendum)
PROGRESS NOTE    Andre Stout  KXF:[id number] DOB: Aug 26, 1940 DOA: 08/14/2022 PCP: [name], FNP    Brief Narrative:   Andre Stout is a 82 y.o. male with past medical history significant for HTN, prostate cancer, insulin-dependent diabetes mellitus, high-grade AV block, who presented to [facility] ED on 8/15 with abdominal pain.  Patient reports right upper quadrant abdominal pain associated with nausea/vomiting, lightheadedness that has progressed over the last 3 days.  Worse after eating.  Reports chills but denies fevers.  In the ED, temperature 99.6 F, BP 190/79, SPO2 97% on room air, HR 74, RR 18.  WBC 19.9, hemoglobin 15.0, platelets 334.  Sodium 137, potassium 3.8, chloride 102, CO2 25, glucose 116, BUN 11, creatinine 1.07.  AST 16, ALT 11, total bilirubin 1.6.  High-sensitivity troponin 26>23. Urinalysis with negative leukocytes, negative nitrite, no bacteria, 0-5 WBCs.  CT abdomen/pelvis with cholelithiasis with gallbladder wall thickening and surrounding inflammatory changes consistent with possible acute cholecystitis, thickening of the walls of the distal esophagus with small hiatal hernia.  Right upper quadrant ultrasound with cholelithiasis with findings suspicious for acute cholecystitis with tenderness on gallbladder palpation per sonographer.  General surgery was consulted.  TRH consulted for further evaluation and management of acute cholecystitis.  Assessment & Plan:   Cholecystitis, acute Presenting with progressive abdominal pain associate with nausea and vomiting, localized to the right upper quadrant.  CT abdomen/pelvis and right upper quadrant ultrasound consistent with acute cholecystitis. --General surgery following, appreciate assistance --WBC 19.9>12.8 --N.p.o. --D5 NS w/ 20 mEq KCL at 50 mL/h --Continue antibiotics with Zosyn --Repeat CBC in a.m. --Given patient's complete heart block, general surgery deferring laparoscopic cholecystectomy for now and  may consider cholecystostomy tube versus just management with antibiotics given his improvement so far.  Complete heart block Patient recently referred to electrophysiology outpatient.  On arrival, EKG consistent with complete heart block; although asymptomatic.  General surgery concerned about decompensation if given general anesthesia for cholecystectomy.  Cardiology was consulted and followed during his hospitalization.  Options could include placement of temporary venous pacing to undergo cholecystectomy versus likely plan for percutaneous cholecystostomy tube placement in which she can follow-up for PPM once his infection clears.  Given his current infection improvement, general surgery believes may be able to mnage with antibiotics alone. --TTE: pending --Transferring to [facility] for EP evaluation per recommendations from cardiology, Dr. Alda Lea --Continue to monitor on telemetry  Demand ischemia ruled out, Nonischemic myocardial injury due to underlying illness/infection Troponin elevated at 26 followed by 23, flat.  EKG with no acute ischemic changes.  Patient denies chest pain, not consistent with ACS and likely secondary to underlying illness/infection  --Continue monitor on telemetry  Type 2 diabetes mellitus, with hyperglycemia Hemoglobin A1c 8.3 on 08/02/2022, not optimally controlled.  Home regimen includes NPH 36 units BID AC --Holding long-acting insulin while n.p.o. --SSI for coverage --CBGs q4h while NPO  Essential hypertension Currently off of antihypertensives outpatient. --Hydralazine 10 mg IV q8h --Hydralazine 5 mg IV q6h PRN SBP >150 or DBP >100  Obesity Body mass index is 31.45 kg/m.  Discussed with patient needs for aggressive lifestyle changes/weight loss as this complicates all facets of care.  Outpatient follow-up with PCP.  May benefit from bariatric evaluation outpatient.    DVT prophylaxis: enoxaparin (LOVENOX) injection 40 mg Start: 08/14/22  1800    Code Status: Full Code Family Communication: Updated patient's grandson yesterday morning, no family present at bedside today  Disposition Plan:  Level  of care: Telemetry Cardiac Status is: Inpatient Remains inpatient appropriate because: Pending transfer to [facility] for EP evaluation, continues on IV antibiotics, awaiting general surgery sign off, may require percutaneous cholecystostomy    Consultants:  General surgery Cardiology, Dr. Alda Lea  Procedures:  None  Antimicrobials:  Zosyn 8/15   Subjective: Patient seen examined bedside, resting comfortably.  Lying in bed.  No family present.  Still awaiting transfer to [facility] for repeat evaluation.  Continues on IV antibiotics.  Reports abdominal pain remains resolved but complains of some mild nausea.  Asks if he can eat anything.  Discussed with general surgery PA this morning. Patient with no other complaints or concerns at this time.  Denies headache, no dizziness, no visual changes, no chest pain, palpitations, no shortness of breath, no current abdominal pain, no cough/congestion, no current fever/chills/night sweats, no vomiting/diarrhea, no focal weakness, no fatigue, no paresthesias.  No acute events overnight per nursing staff.  Objective: Vitals:   08/15/22 2041 08/16/22 0418 08/16/22 0754 08/16/22 0905  BP: (!) 176/76 (!) 200/91 (!) 196/81 (!) 151/78  Pulse: 64 66  (!) 58  Resp: '18 18  18  '$ Temp: 98.7 F (37.1 C) 98.5 F (36.9 C)  98.6 F (37 C)  TempSrc: Oral Oral  Oral  SpO2: 100% 100%  98%  Weight:      Height:        Intake/Output Summary (Last 24 hours) at 08/16/2022 1025 Last data filed at 08/16/2022 0748 Gross per 24 hour  Intake 1249.94 ml  Output 700 ml  Net 549.94 ml   Filed Weights   08/14/22 1654  Weight: 83.1 kg    Examination:  Physical Exam: GEN: NAD, alert and oriented x 3, elderly in appearance HEENT: NCAT, PERRL, EOMI, sclera clear, MMM PULM: CTAB w/o wheezes/crackles,  normal respiratory effort CV: Bradycardic, regular rhythm w/o M/G/R GI: abd soft, NTND, NABS, no R/G/M MSK: no peripheral edema, muscle strength globally intact 5/5 bilateral upper/lower extremities NEURO: CN II-XII intact, no focal deficits, sensation to light touch intact PSYCH: normal mood/affect Integumentary: dry/intact, no rashes or wounds    Data Reviewed: I have personally reviewed following labs and imaging studies  CBC: Recent Labs  Lab 08/14/22 0122 08/15/22 0553 08/16/22 0524  WBC 19.9* 12.8* 10.8*  NEUTROABS 16.1*  --   --   HGB 15.0 12.9* 12.6*  HCT 45.8 39.6 39.0  MCV 83.7 84.4 85.7  PLT 334 302 568   Basic Metabolic Panel: Recent Labs  Lab 08/14/22 0122 08/14/22 1749 08/15/22 0553 08/16/22 0524  NA 137  --  140 138  K 3.8  --  3.5 3.8  CL 102  --  106 106  CO2 25  --  27 23  GLUCOSE 116*  --  84 133*  BUN 11  --  15 12  CREATININE 1.07  --  1.09 1.15  CALCIUM 9.3  --  8.5* 8.5*  MG  --  1.8  --  1.9  PHOS  --  3.0  --   --    GFR: Estimated Creatinine Clearance: 49 mL/min (by C-G formula based on SCr of 1.15 mg/dL). Liver Function Tests: Recent Labs  Lab 08/14/22 0122 08/15/22 0553 08/16/22 0524  AST 16 10* 11*  ALT '11 11 9  '$ ALKPHOS 87 66 71  BILITOT 1.6* 1.6* 1.3*  PROT 7.4 6.4* 6.5  ALBUMIN 3.1* 2.6* 2.7*   No results for input(s): "LIPASE", "AMYLASE" in the last 168 hours. No results for  input(s): "AMMONIA" in the last 168 hours. Coagulation Profile: Recent Labs  Lab 08/14/22 1200  INR 1.3*   Cardiac Enzymes: No results for input(s): "CKTOTAL", "CKMB", "CKMBINDEX", "TROPONINI" in the last 168 hours. BNP (last 3 results) No results for input(s): "PROBNP" in the last 8760 hours. HbA1C: No results for input(s): "HGBA1C" in the last 72 hours. CBG: Recent Labs  Lab 08/15/22 1646 08/15/22 2029 08/15/22 2358 08/16/22 0419 08/16/22 0750  GLUCAP 124* 137* 138* 153* 119*   Lipid Profile: No results for input(s): "CHOL",  "HDL", "LDLCALC", "TRIG", "CHOLHDL", "LDLDIRECT" in the last 72 hours. Thyroid Function Tests: No results for input(s): "TSH", "T4TOTAL", "FREET4", "T3FREE", "THYROIDAB" in the last 72 hours. Anemia Panel: No results for input(s): "VITAMINB12", "FOLATE", "FERRITIN", "TIBC", "IRON", "RETICCTPCT" in the last 72 hours. Sepsis Labs: No results for input(s): "PROCALCITON", "LATICACIDVEN" in the last 168 hours.  No results found for this or any previous visit (from the past 240 hour(s)).       Radiology Studies: US Abdomen Limited RUQ (LIVER/GB)  Result Date: 08/14/2022 CLINICAL DATA:  Right upper quadrant pain and vomiting EXAM: ULTRASOUND ABDOMEN LIMITED RIGHT UPPER QUADRANT COMPARISON:  CT of earlier today FINDINGS: Gallbladder: Small gallstones. Wall thickening at 5 mm. Trace pericholecystic edema and fluid, including on CT. The technologist describes tenderness with gallbladder palpation. Common bile duct: Diameter: Normal, 5 mm Liver: No focal lesion identified. Within normal limits in parenchymal echogenicity. Portal vein is patent on color Doppler imaging with normal direction of blood flow towards the liver. Other: None. IMPRESSION: Cholelithiasis with findings suspicious for acute cholecystitis. Electronically Signed   By: Abigail Miyamoto M.D.   On: 08/14/2022 10:43        Scheduled Meds:  aspirin  81 mg Oral QHS   brimonidine  1 drop Both Eyes BID WC   dorzolamide-timolol  1 drop Both Eyes BID WC   enoxaparin (LOVENOX) injection  40 mg Subcutaneous Q24H   hydrALAZINE  10 mg Intravenous Q8H   insulin aspart  0-15 Units Subcutaneous TID WC   latanoprost  1 drop Both Eyes QHS   Continuous Infusions:  dextrose 5 % and 0.9 % NaCl with KCl 20 mEq/L 50 mL/hr at 08/15/22 1627   piperacillin-tazobactam (ZOSYN)  IV 3.375 g (08/16/22 0441)     LOS: 2 days    Time spent: 48 minutes spent on chart review, discussion with nursing staff, consultants, updating family and interview/physical  exam; more than 50% of that time was spent in counseling and/or coordination of care.    Margree Gimbel J [place] DO [facility] Available via Epic secure chat 7am-7pm After these hours, please refer to coverage provider listed on [email or web address] 08/16/2022, 10:25 AM

## 2022-08-16 NOTE — Progress Notes (Signed)
Echocardiogram 2D Echocardiogram has been performed.  Oneal Deputy Tanaja Ganger RDCS 08/16/2022, 8:44 AM

## 2022-08-16 NOTE — Progress Notes (Signed)
Progress Note     Subjective: Still with mild nausea. Not worse from yesterday. No abdominal pain. No emesis. Last BM 2 days ago  Objective: Vital signs in last 24 hours: Temp:  [98.1 F (36.7 C)-98.7 F (37.1 C)] 98.6 F (37 C) (08/17 0905) Pulse Rate:  [58-66] 58 (08/17 0905) Resp:  [18] 18 (08/17 0905) BP: (148-200)/(62-91) 151/78 (08/17 0905) SpO2:  [98 %-100 %] 98 % (08/17 0905) Last BM Date :  (pta)  Intake/Output from previous day: 08/16 0701 - 08/17 0700 In: 2150 [I.V.:2050; IV Piggyback:100] Out: 600 [Urine:600] Intake/Output this shift: Total I/O In: 499.9 [I.V.:411; IV Piggyback:88.9] Out: 100 [Urine:100]  PE: General: pleasant, WD, male who is laying in bed in NAD HEENT: head is normocephalic, atraumatic. Mouth is pink and moist Abd: soft, mildly distended. No TTP.  MSK: all 4 extremities are symmetrical with no cyanosis, clubbing, or edema. Skin: warm and dry Psych: A&Ox3 with an appropriate affect.    Lab Results:  Recent Labs    08/15/22 0553 08/16/22 0524  WBC 12.8* 10.8*  HGB 12.9* 12.6*  HCT 39.6 39.0  PLT 302 327    BMET Recent Labs    08/15/22 0553 08/16/22 0524  NA 140 138  K 3.5 3.8  CL 106 106  CO2 27 23  GLUCOSE 84 133*  BUN 15 12  CREATININE 1.09 1.15  CALCIUM 8.5* 8.5*    PT/INR Recent Labs    08/14/22 1200  LABPROT 16.0*  INR 1.3*    CMP     Component Value Date/Time   NA 138 08/16/2022 0524   K 3.8 08/16/2022 0524   CL 106 08/16/2022 0524   CO2 23 08/16/2022 0524   GLUCOSE 133 (H) 08/16/2022 0524   BUN 12 08/16/2022 0524   CREATININE 1.15 08/16/2022 0524   CREATININE 1.11 01/12/2022 1541   CALCIUM 8.5 (L) 08/16/2022 0524   PROT 6.5 08/16/2022 0524   ALBUMIN 2.7 (L) 08/16/2022 0524   AST 11 (L) 08/16/2022 0524   ALT 9 08/16/2022 0524   ALKPHOS 71 08/16/2022 0524   BILITOT 1.3 (H) 08/16/2022 0524   GFRNONAA >60 08/16/2022 0524   GFRAA >60 03/07/2016 0337   Lipase     Component Value Date/Time    LIPASE 22 03/04/2016 1149       Studies/Results: US Abdomen Limited RUQ (LIVER/GB)  Result Date: 08/14/2022 CLINICAL DATA:  Right upper quadrant pain and vomiting EXAM: ULTRASOUND ABDOMEN LIMITED RIGHT UPPER QUADRANT COMPARISON:  CT of earlier today FINDINGS: Gallbladder: Small gallstones. Wall thickening at 5 mm. Trace pericholecystic edema and fluid, including on CT. The technologist describes tenderness with gallbladder palpation. Common bile duct: Diameter: Normal, 5 mm Liver: No focal lesion identified. Within normal limits in parenchymal echogenicity. Portal vein is patent on color Doppler imaging with normal direction of blood flow towards the liver. Other: None. IMPRESSION: Cholelithiasis with findings suspicious for acute cholecystitis. Electronically Signed   By: Abigail Miyamoto M.D.   On: 08/14/2022 10:43    Anti-infectives: Anti-infectives (From admission, onward)    Start     Dose/Rate Route Frequency Ordered Stop   08/14/22 2000  piperacillin-tazobactam (ZOSYN) IVPB 3.375 g        3.375 g 12.5 mL/hr over 240 Minutes Intravenous Every 8 hours 08/14/22 1516     08/14/22 1145  piperacillin-tazobactam (ZOSYN) IVPB 3.375 g        3.375 g 12.5 mL/hr over 240 Minutes Intravenous  Once 08/14/22 1138 08/14/22 1632  Assessment/Plan Cholecystitis - cont IV abx - WBC improved - do not recommend general anesthesia/lap chole in setting of heart block. Continue medical management with abx and consider perc chole if he fails to improve - trial clears today   FEN - clears/IVFs VTE - lovenox ID - [email or web address]   Complete heart block HTN DM H/o prostate cancer  I reviewed hospitalist notes, last 24 h vitals and pain scores, last 24 h labs and trends, and last 24 h imaging results.  This care required moderate level of medical decision making.    LOS: 2 days   [facility] 08/16/2022, 9:42 AM Please see Amion for pager number during day hours  7:00am-4:30pm

## 2022-08-16 NOTE — Progress Notes (Signed)
Mobility Specialist - Progress Note    08/16/22 1158  Mobility  Activity Ambulated with assistance in hallway  Level of Assistance Contact guard assist, steadying assist  Assistive Device Front wheel walker  Distance Ambulated (ft) 100 ft  Activity Response Tolerated well  $Mobility charge 1 Mobility   Pt was found in bed and agreeable to mobilize. Pt had no complaints during ambulation but does fatigue quickly. Pt was left in bed with all necessities in need and RN was notified of session.  Ferd Hibbs Mobility Specialist

## 2022-08-16 NOTE — Progress Notes (Signed)
Progress Note  Patient Name: Andre Stout Date of Encounter: 08/16/2022  [facility] Cardiologist: Evalina Field, MD   Subjective   Denies any chest pain, dyspnea, or lightheadedness  Inpatient Medications    Scheduled Meds:  aspirin  81 mg Oral QHS   brimonidine  1 drop Both Eyes BID WC   dorzolamide-timolol  1 drop Both Eyes BID WC   enoxaparin (LOVENOX) injection  40 mg Subcutaneous Q24H   hydrALAZINE  10 mg Intravenous Q8H   insulin aspart  0-15 Units Subcutaneous TID WC   latanoprost  1 drop Both Eyes QHS   Continuous Infusions:  dextrose 5 % and 0.9 % NaCl with KCl 20 mEq/L 50 mL/hr at 08/15/22 1627   piperacillin-tazobactam (ZOSYN)  IV 3.375 g (08/16/22 0441)   PRN Meds: acetaminophen, hydrALAZINE, HYDROmorphone (DILAUDID) injection, ondansetron (ZOFRAN) IV, perflutren lipid microspheres (DEFINITY) IV suspension   Vital Signs    Vitals:   08/15/22 2041 08/16/22 0418 08/16/22 0754 08/16/22 0905  BP: (!) 176/76 (!) 200/91 (!) 196/81 (!) 151/78  Pulse: 64 66  (!) 58  Resp: '18 18  18  '$ Temp: 98.7 F (37.1 C) 98.5 F (36.9 C)  98.6 F (37 C)  TempSrc: Oral Oral  Oral  SpO2: 100% 100%  98%  Weight:      Height:        Intake/Output Summary (Last 24 hours) at 08/16/2022 1041 Last data filed at 08/16/2022 0748 Gross per 24 hour  Intake 1249.94 ml  Output 700 ml  Net 549.94 ml      08/14/2022    4:54 PM 08/13/2022   12:01 PM 08/01/2022    3:38 PM  Last 3 Weights  Weight (lbs) 183 lb 3.2 oz 195 lb 195 lb 15.8 oz  Weight (kg) 83.1 kg 88.451 kg 88.9 kg      Telemetry    CHB with junctional escape rhythm in 50s - Personally Reviewed  ECG    No new ECG - Personally Reviewed  Physical Exam   GEN: No acute distress.   Neck: No JVD Cardiac: RRR, no murmurs, rubs, or gallops.  Respiratory: Clear to auscultation bilaterally. GI: Soft, nontender, non-distended  MS: Trace edema; No deformity. Neuro:  Nonfocal  Psych: Normal affect   Labs     High Sensitivity Troponin:   Recent Labs  Lab 07/31/22 2013 08/14/22 1749 08/14/22 1817  TROPONINIHS 25* 26* 23*     Chemistry Recent Labs  Lab 08/14/22 0122 08/14/22 1749 08/15/22 0553 08/16/22 0524  NA 137  --  140 138  K 3.8  --  3.5 3.8  CL 102  --  106 106  CO2 25  --  27 23  GLUCOSE 116*  --  84 133*  BUN 11  --  15 12  CREATININE 1.07  --  1.09 1.15  CALCIUM 9.3  --  8.5* 8.5*  MG  --  1.8  --  1.9  PROT 7.4  --  6.4* 6.5  ALBUMIN 3.1*  --  2.6* 2.7*  AST 16  --  10* 11*  ALT 11  --  11 9  ALKPHOS 87  --  66 71  BILITOT 1.6*  --  1.6* 1.3*  GFRNONAA >60  --  >60 >60  ANIONGAP 10  --  7 9    Lipids No results for input(s): "CHOL", "TRIG", "HDL", "LABVLDL", "LDLCALC", "CHOLHDL" in the last 168 hours.  Hematology Recent Labs  Lab 08/14/22 0122 08/15/22 6568  08/16/22 0524  WBC 19.9* 12.8* 10.8*  RBC 5.47 4.69 4.55  HGB 15.0 12.9* 12.6*  HCT 45.8 39.6 39.0  MCV 83.7 84.4 85.7  MCH 27.4 27.5 27.7  MCHC 32.8 32.6 32.3  RDW 14.2 14.4 14.3  PLT 334 302 327   Thyroid No results for input(s): "TSH", "FREET4" in the last 168 hours.  BNPNo results for input(s): "BNP", "PROBNP" in the last 168 hours.  DDimer No results for input(s): "DDIMER" in the last 168 hours.   Radiology    No results found.  Cardiac Studies     Patient Profile     82 y.o. 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  He was recently admitted earlier this month for this.  Electrolytes were repleted and heart rates improved but he remained in complete heart block.  Given normal ventricular rate, no PPM was  recommended.  He was recently seen in the EP clinic at which time he reported intermittent dizziness, lightheadedness at home.  He was still complete heart block but rates were in the 70s.  Therefore, outpatient monitor was recommended to see if symptoms correlated with heart rate dysfunction.  He now presents with acute cholecystitis.  He remains in complete heart block with controlled ventricular rate.  Electrolytes within normal limits. General Surgery has been consulted and is considering laparoscopic cholecystectomy versus percutaneous drain would like her input given ongoing complete heart block. Patient is mostly asymptomatic with the CHB. He notes occasional very mild episodes of lightheadedness/dizziness but nothing significant. No recurrent syncope since last admission. No angina or acute CHF symptoms. Per Revised Cardiac Risk Index, considered moderate risk with 6.6% chance of adverse outcome given insulin dependent diabetes and potential and intraperitoneal surgery. However, this does not take into account patient's CHB.  -Check echo -Planning to transfer to [facility] for EP evaluation -Discussed with EP, recommend proceeding with surgery if needed and will treat bradycardia if it occurs   Elevated Troponin High-sensitivity troponin minimally elevated and flat in the 20s which is were it was during recent admission earlier this month. EKG shows no acute ischemic changes compared to prior tracings. No chest pain. Not consistent with ACS. Suspect due to underlying illnesses. No ischemic work-up planned at this time.   Hypertension BP elevated. Oral antihypertensive on hold as patient is NPO. Continue to use PRN Hydralazine. Restart oral medications when OK with General Surgery.   Otherwise, per primary team: - Acute cholecystitis - Type 2 diabetes mellitus  For questions or updates, please contact [facility] Please consult [email or web address] for contact info under        Signed, Donato Heinz, MD  08/16/2022, 10:41 AM

## 2022-08-17 ENCOUNTER — Inpatient Hospital Stay (HOSPITAL_COMMUNITY): Payer: Medicare Other

## 2022-08-17 ENCOUNTER — Other Ambulatory Visit (HOSPITAL_COMMUNITY): Payer: Self-pay | Admitting: Radiology

## 2022-08-17 DIAGNOSIS — K810 Acute cholecystitis: Secondary | ICD-10-CM

## 2022-08-17 DIAGNOSIS — I442 Atrioventricular block, complete: Secondary | ICD-10-CM | POA: Diagnosis not present

## 2022-08-17 DIAGNOSIS — K819 Cholecystitis, unspecified: Secondary | ICD-10-CM | POA: Diagnosis not present

## 2022-08-17 HISTORY — PX: IR PERC CHOLECYSTOSTOMY: IMG2326

## 2022-08-17 LAB — GLUCOSE, CAPILLARY
Glucose-Capillary: 146 mg/dL — ABNORMAL HIGH (ref 70–99)
Glucose-Capillary: 209 mg/dL — ABNORMAL HIGH (ref 70–99)
Glucose-Capillary: 233 mg/dL — ABNORMAL HIGH (ref 70–99)
Glucose-Capillary: 235 mg/dL — ABNORMAL HIGH (ref 70–99)
Glucose-Capillary: 259 mg/dL — ABNORMAL HIGH (ref 70–99)
Glucose-Capillary: 281 mg/dL — ABNORMAL HIGH (ref 70–99)

## 2022-08-17 MED ORDER — DIPHENHYDRAMINE HCL 50 MG/ML IJ SOLN
INTRAMUSCULAR | Status: AC | PRN
  Administered 2022-08-17: 50 mg via INTRAVENOUS

## 2022-08-17 MED ORDER — MIDAZOLAM HCL 2 MG/2ML IJ SOLN
INTRAMUSCULAR | Status: AC
  Filled 2022-08-17: qty 2

## 2022-08-17 MED ORDER — FENTANYL CITRATE (PF) 100 MCG/2ML IJ SOLN
INTRAMUSCULAR | Status: AC
  Filled 2022-08-17: qty 2

## 2022-08-17 MED ORDER — IOHEXOL 300 MG/ML  SOLN
100.0000 mL | Freq: Once | INTRAMUSCULAR | Status: AC | PRN
  Administered 2022-08-17: 10 mL

## 2022-08-17 MED ORDER — LIDOCAINE HCL 1 % IJ SOLN
INTRAMUSCULAR | Status: AC
  Filled 2022-08-17: qty 20

## 2022-08-17 MED ORDER — DIPHENHYDRAMINE HCL 50 MG/ML IJ SOLN
INTRAMUSCULAR | Status: AC
  Filled 2022-08-17: qty 1

## 2022-08-17 MED ORDER — SODIUM CHLORIDE 0.9% FLUSH
5.0000 mL | Freq: Three times a day (TID) | INTRAVENOUS | Status: DC
  Administered 2022-08-17 – 2022-08-28 (×31): 5 mL

## 2022-08-17 MED ORDER — FENTANYL CITRATE (PF) 100 MCG/2ML IJ SOLN
INTRAMUSCULAR | Status: AC | PRN
  Administered 2022-08-17: 25 ug via INTRAVENOUS
  Administered 2022-08-17: 50 ug via INTRAVENOUS
  Administered 2022-08-17: 25 ug via INTRAVENOUS

## 2022-08-17 NOTE — Sedation Documentation (Signed)
Report given to charge RN. Made aware of spike in blood pressure post chole tube insertion. Patient is complaining of right should pain at this time. No further complaints. IR physician made aware of BP. Likely due to pain from chole tube drain. Should drop down after a few cycles. Patient also complains of left foot itching continuously.

## 2022-08-17 NOTE — Plan of Care (Signed)
  Problem: Elimination: Goal: Will not experience complications related to urinary retention Outcome: Completed/Met

## 2022-08-17 NOTE — Care Management Important Message (Signed)
Important Message  Patient Details  Name: KYNDAL Stout MRN: [id number] Date of Birth: 02/06/1940   Medicare Important Message Given:  Yes     Orbie Pyo 08/17/2022, 1:58 PM

## 2022-08-17 NOTE — Progress Notes (Signed)
PROGRESS NOTE    Andre Stout  KDX:[id number] DOB: 1940-10-23 DOA: 08/14/2022 PCP: Andre Salvage, FNP    Brief Narrative:   Andre Stout is a 82 y.o. male with past medical history significant for HTN, prostate cancer, insulin-dependent diabetes mellitus, high-grade AV block, who presented to [facility] ED on 8/15 with abdominal pain , found to have cholelithiasis and acute cholecystitis, cardiology consulted for high-grade AV block, general surgery following, he was transferred from [facility] to [facility] for EP evaluation     Assessment & Plan:   Cholecystitis, acute -Given patient's complete heart block, general surgery deferring laparoscopic cholecystectomy for now -s/p cholecystostomy tube placement on 8/18, drain culture sent on 8/18. --Continue antibiotics with Zosyn, f/u on drain culture  -Currently on clear liquid diet -WBC trending down ---Management per IR/general surgery  Complete heart block -Patient recently referred to electrophysiology outpatient.   -On arrival, EKG consistent with complete heart block; although asymptomatic.   -General surgery concerned about decompensation if given general anesthesia for cholecystectomy.   ---TTE: pending ---- Management per  EP/ cardiology  Mild elevated troponin -EKG no acute ischemic changes -No chest pain -Evaluated by cardiology who does not think this is consistent with ACS -No ischemic work-up planned -Currently on aspirin -Management per cardiology  Insulin-dependent type 2 diabetes, uncontrolled with hyperglycemia Type 2 diabetes mellitus, with hyperglycemia Hemoglobin A1c 8.3 on 08/02/2022,  Adjust insulin as needed  Essential hypertension BP stable on current regimen   Obesity Body mass index is 32.72 kg/m.     DVT prophylaxis: enoxaparin (LOVENOX) injection 40 mg Start: 08/14/22 1800    Code Status: Full Code Family Communication: Family at bedside  Disposition Plan:   Remains inpatient  appropriate because:  EP evaluation, continues on IV antibiotics, awaiting general surgery sign off, may require percutaneous cholecystostomy    Consultants:  General surgery Cardiology, Dr. Alda Lea EP  Procedures:  None  Antimicrobials:  Zosyn 8/15   Subjective:   resting comfortably.  Denies pain  family at bedside . Objective: Vitals:   08/17/22 0005 08/17/22 0348 08/17/22 0420 08/17/22 0634  BP: (!) 143/63 (!) 178/70 (!) 171/70 135/60  Pulse: 66 63 (!) 59 64  Resp: 17 13    Temp: 98.6 F (37 C) 98.6 F (37 C)    TempSrc: Oral Oral    SpO2: 100% 98%    Weight: 89.2 kg     Height:        Intake/Output Summary (Last 24 hours) at 08/17/2022 0752 Last data filed at 08/17/2022 0548 Gross per 24 hour  Intake 1297.1 ml  Output 1827 ml  Net -529.9 ml   Filed Weights   08/14/22 1654 08/16/22 1952 08/17/22 0005  Weight: 83.1 kg 90.4 kg 89.2 kg    Examination:  Physical Exam: GEN: NAD, alert and oriented x 3, elderly in appearance HEENT: NCAT, PERRL, EOMI, sclera clear, MMM PULM: CTAB w/o wheezes/crackles, normal respiratory effort CV: Bradycardic, regular rhythm w/o M/G/R GI: abd soft, NTND, NABS, no R/G/M  MSK: no peripheral edema, muscle strength globally intact 5/5 bilateral upper/lower extremities NEURO: CN II-XII intact, no focal deficits, sensation to light touch intact PSYCH: normal mood/affect Integumentary: dry/intact, no rashes or wounds    Data Reviewed: I have personally reviewed following labs and imaging studies  CBC: Recent Labs  Lab 08/14/22 0122 08/15/22 0553 08/16/22 0524  WBC 19.9* 12.8* 10.8*  NEUTROABS 16.1*  --   --   HGB 15.0 12.9* 12.6*  HCT 45.8  39.6 39.0  MCV 83.7 84.4 85.7  PLT 334 302 735   Basic Metabolic Panel: Recent Labs  Lab 08/14/22 0122 08/14/22 1749 08/15/22 0553 08/16/22 0524  NA 137  --  140 138  K 3.8  --  3.5 3.8  CL 102  --  106 106  CO2 25  --  27 23  GLUCOSE 116*  --  84 133*  BUN 11  --  15 12   CREATININE 1.07  --  1.09 1.15  CALCIUM 9.3  --  8.5* 8.5*  MG  --  1.8  --  1.9  PHOS  --  3.0  --   --    GFR: Estimated Creatinine Clearance: 51.7 mL/min (by C-G formula based on SCr of 1.15 mg/dL). Liver Function Tests: Recent Labs  Lab 08/14/22 0122 08/15/22 0553 08/16/22 0524  AST 16 10* 11*  ALT '11 11 9  '$ ALKPHOS 87 66 71  BILITOT 1.6* 1.6* 1.3*  PROT 7.4 6.4* 6.5  ALBUMIN 3.1* 2.6* 2.7*   No results for input(s): "LIPASE", "AMYLASE" in the last 168 hours. No results for input(s): "AMMONIA" in the last 168 hours. Coagulation Profile: Recent Labs  Lab 08/14/22 1200  INR 1.3*   Cardiac Enzymes: No results for input(s): "CKTOTAL", "CKMB", "CKMBINDEX", "TROPONINI" in the last 168 hours. BNP (last 3 results) No results for input(s): "PROBNP" in the last 8760 hours. HbA1C: No results for input(s): "HGBA1C" in the last 72 hours. CBG: Recent Labs  Lab 08/16/22 1709 08/16/22 2006 08/17/22 0010 08/17/22 0347 08/17/22 0623  GLUCAP 169* 154* 146* 235* 281*   Lipid Profile: No results for input(s): "CHOL", "HDL", "LDLCALC", "TRIG", "CHOLHDL", "LDLDIRECT" in the last 72 hours. Thyroid Function Tests: No results for input(s): "TSH", "T4TOTAL", "FREET4", "T3FREE", "THYROIDAB" in the last 72 hours. Anemia Panel: No results for input(s): "VITAMINB12", "FOLATE", "FERRITIN", "TIBC", "IRON", "RETICCTPCT" in the last 72 hours. Sepsis Labs: No results for input(s): "PROCALCITON", "LATICACIDVEN" in the last 168 hours.  No results found for this or any previous visit (from the past 240 hour(s)).       Radiology Studies: ECHOCARDIOGRAM COMPLETE  Result Date: 08/16/2022    ECHOCARDIOGRAM REPORT   Patient Name:   Andre Stout Date of Exam: 08/16/2022 Medical Rec #:  [id number]      Height:       64.0 in Accession #:    [id number]     Weight:       183.2 lb Date of Birth:  May 17, 1940     BSA:          1.885 m Patient Age:    52 years       BP:           200/91 mmHg Patient  Gender: M              HR:           65 bpm. Exam Location:  Inpatient Procedure: 2D Echo, Color Doppler, Cardiac Doppler and Intracardiac            Opacification Agent Indications:    Heart Block  History:        Patient has prior history of Echocardiogram examinations, most                 recent 01/03/2022. Risk Factors:Hypertension and Diabetes.  Sonographer:    Raquel Sarna Senior RDCS Referring Phys: [id number] Donato Heinz  Sonographer Comments: Suboptimal apical window. IMPRESSIONS  1. Left ventricular ejection fraction,  by estimation, is 60 to 65%. The left ventricle has normal function. The left ventricle has no regional wall motion abnormalities. There is mild left ventricular hypertrophy. Left ventricular diastolic parameters are consistent with Grade I diastolic dysfunction (impaired relaxation).  2. Right ventricular systolic function is normal. The right ventricular size is normal. Tricuspid regurgitation signal is inadequate for assessing PA pressure.  3. Left atrial size was moderately dilated.  4. The mitral valve is abnormal. Trivial mitral valve regurgitation.  5. The aortic valve was not well visualized. Aortic valve regurgitation is not visualized.  6. The inferior vena cava is normal in size with greater than 50% respiratory variability, suggesting right atrial pressure of 3 mmHg.  7. Cannot exclude a small PFO. Comparison(s): Prior images unable to be directly viewed, comparison made by report only. Changes from prior study are noted. 01/03/2022: LVEF 55-60%. FINDINGS  Left Ventricle: Left ventricular ejection fraction, by estimation, is 60 to 65%. The left ventricle has normal function. The left ventricle has no regional wall motion abnormalities. Definity contrast agent was given IV to delineate the left ventricular  endocardial borders. The left ventricular internal cavity size was normal in size. There is mild left ventricular hypertrophy. Left ventricular diastolic parameters are consistent  with Grade I diastolic dysfunction (impaired relaxation). Indeterminate filling pressures. Right Ventricle: The right ventricular size is normal. No increase in right ventricular wall thickness. Right ventricular systolic function is normal. Tricuspid regurgitation signal is inadequate for assessing PA pressure. Left Atrium: Left atrial size was moderately dilated. Right Atrium: Right atrial size was normal in size. Pericardium: There is no evidence of pericardial effusion. Mitral Valve: The mitral valve is abnormal. There is mild thickening of the mitral valve leaflet(s). Trivial mitral valve regurgitation. Tricuspid Valve: The tricuspid valve is not well visualized. Tricuspid valve regurgitation is not demonstrated. Aortic Valve: The aortic valve was not well visualized. Aortic valve regurgitation is not visualized. Pulmonic Valve: The pulmonic valve was not well visualized. Pulmonic valve regurgitation is not visualized. Aorta: The aortic root and ascending aorta are structurally normal, with no evidence of dilitation. Venous: The inferior vena cava is normal in size with greater than 50% respiratory variability, suggesting right atrial pressure of 3 mmHg. IAS/Shunts: The interatrial septum is aneurysmal. Cannot exclude a small PFO.  LEFT VENTRICLE PLAX 2D LVIDd:         4.00 cm   Diastology LVIDs:         2.50 cm   LV e' medial:    4.79 cm/s LV PW:         1.00 cm   LV E/e' medial:  17.5 LV IVS:        1.10 cm   LV e' lateral:   7.51 cm/s LVOT diam:     2.10 cm   LV E/e' lateral: 11.2 LV SV:         67 LV SV Index:   35 LVOT Area:     3.46 cm  RIGHT VENTRICLE RV S prime:     11.70 cm/s TAPSE (M-mode): 1.8 cm LEFT ATRIUM             Index        RIGHT ATRIUM           Index LA diam:        3.70 cm 1.96 cm/m   RA Area:     19.30 cm LA Vol (A2C):   64.9 ml 34.43 ml/m  RA Volume:   57.10  ml  30.30 ml/m LA Vol (A4C):   95.8 ml 50.83 ml/m LA Biplane Vol: 78.8 ml 41.81 ml/m  AORTIC VALVE LVOT Vmax:   88.80 cm/s  LVOT Vmean:  59.100 cm/s LVOT VTI:    0.193 m  AORTA Ao Root diam: 3.20 cm Ao Asc diam:  3.00 cm MITRAL VALVE MV Area (PHT): 3.85 cm    SHUNTS MV Decel Time: 197 msec    Systemic VTI:  0.19 m MV E velocity: 84.00 cm/s  Systemic Diam: 2.10 cm MV A velocity: 94.70 cm/s MV E/A ratio:  0.89 Lyman Bishop MD Electronically signed by Lyman Bishop MD Signature Date/Time: 08/16/2022/10:56:43 AM    Final         Scheduled Meds:  aspirin  81 mg Oral QHS   brimonidine  1 drop Both Eyes BID WC   dorzolamide-timolol  1 drop Both Eyes BID WC   enoxaparin (LOVENOX) injection  40 mg Subcutaneous Q24H   hydrALAZINE  10 mg Intravenous Q8H   insulin aspart  0-15 Units Subcutaneous TID WC   latanoprost  1 drop Both Eyes QHS   Continuous Infusions:  dextrose 5 % and 0.9 % NaCl with KCl 20 mEq/L 50 mL/hr at 08/17/22 0120   piperacillin-tazobactam (ZOSYN)  IV 3.375 g (08/17/22 0548)     LOS: 3 days       Florencia Reasons, MD PhD FACP [facility] Available via Epic secure chat 7am-7pm After these hours, please refer to coverage provider listed on [email or web address] 08/17/2022, 7:52 AM

## 2022-08-17 NOTE — Procedures (Signed)
Interventional Radiology Procedure Note  Procedure: Image guided percutaneous cholecystostomy tube placement  Findings: Please refer to procedural dictation for full description. 10.2 Fr subhepatic/transperitoneal cholecystotomy tube placed.  Dark bilious output, sample sent for culture.    Complications: None immediate  Estimated Blood Loss: < 5 mL  Recommendations: Keep to bag drainage. Follow up culture. If the patient is not a candidate for surgery, he would be an ideal candidate for percutaneous gallstone retrieval ([employer]).  IR will continue to follow and arrange outpatient follow up in 2 months.   Ruthann Cancer, MD Pager: [phone]

## 2022-08-17 NOTE — Progress Notes (Signed)
Progress Note     Subjective: Patient still having significant nausea, which was his presenting complaint.  Is on clears but hasn't eaten any this am.  Has his vomit bag beside him still.    Objective: Vital signs in last 24 hours: Temp:  [97.7 F (36.5 C)-98.7 F (37.1 C)] 98.7 F (37.1 C) (08/18 0752) Pulse Rate:  [51-66] 64 (08/18 0752) Resp:  [13-20] 18 (08/18 0752) BP: (115-178)/(58-78) 115/58 (08/18 0752) SpO2:  [97 %-100 %] 98 % (08/18 0752) Weight:  [89.2 kg-90.4 kg] 89.2 kg (08/18 0005) Last BM Date : 08/17/22  Intake/Output from previous day: 08/17 0701 - 08/18 0700 In: 1797 [P.O.:480; I.V.:1092.8; IV Piggyback:224.3] Out: 1927 [Urine:1925; Stool:2] Intake/Output this shift: Total I/O In: -  Out: 300 [Urine:300]  PE: General: pleasant, WD, male who is laying in bed in NAD Abd: soft, mildly distended. Not really tender, but deep palpation he will still mildly guard     Lab Results:  Recent Labs    08/15/22 0553 08/16/22 0524  WBC 12.8* 10.8*  HGB 12.9* 12.6*  HCT 39.6 39.0  PLT 302 327   BMET Recent Labs    08/15/22 0553 08/16/22 0524  NA 140 138  K 3.5 3.8  CL 106 106  CO2 27 23  GLUCOSE 84 133*  BUN 15 12  CREATININE 1.09 1.15  CALCIUM 8.5* 8.5*   PT/INR Recent Labs    08/14/22 1200  LABPROT 16.0*  INR 1.3*   CMP     Component Value Date/Time   NA 138 08/16/2022 0524   K 3.8 08/16/2022 0524   CL 106 08/16/2022 0524   CO2 23 08/16/2022 0524   GLUCOSE 133 (H) 08/16/2022 0524   BUN 12 08/16/2022 0524   CREATININE 1.15 08/16/2022 0524   CREATININE 1.11 01/12/2022 1541   CALCIUM 8.5 (L) 08/16/2022 0524   PROT 6.5 08/16/2022 0524   ALBUMIN 2.7 (L) 08/16/2022 0524   AST 11 (L) 08/16/2022 0524   ALT 9 08/16/2022 0524   ALKPHOS 71 08/16/2022 0524   BILITOT 1.3 (H) 08/16/2022 0524   GFRNONAA >60 08/16/2022 0524   GFRAA >60 03/07/2016 0337   Lipase     Component Value Date/Time   LIPASE 22 03/04/2016 1149        Studies/Results: ECHOCARDIOGRAM COMPLETE  Result Date: 08/16/2022    ECHOCARDIOGRAM REPORT   Patient Name:   Andre Stout Date of Exam: 08/16/2022 Medical Rec #:  [id number]      Height:       64.0 in Accession #:    [id number]     Weight:       183.2 lb Date of Birth:  [birth year]     BSA:          1.885 m Patient Age:    82 years       BP:           200/91 mmHg Patient Gender: M              HR:           65 bpm. Exam Location:  Inpatient Procedure: 2D Echo, Color Doppler, Cardiac Doppler and Intracardiac            Opacification Agent Indications:    Heart Block  History:        Patient has prior history of Echocardiogram examinations, most                 recent 01/03/2022. Risk  Factors:Hypertension and Diabetes.  Sonographer:    Raquel Sarna Senior RDCS Referring Phys: [id number] Donato Heinz  Sonographer Comments: Suboptimal apical window. IMPRESSIONS  1. Left ventricular ejection fraction, by estimation, is 60 to 65%. The left ventricle has normal function. The left ventricle has no regional wall motion abnormalities. There is mild left ventricular hypertrophy. Left ventricular diastolic parameters are consistent with Grade I diastolic dysfunction (impaired relaxation).  2. Right ventricular systolic function is normal. The right ventricular size is normal. Tricuspid regurgitation signal is inadequate for assessing PA pressure.  3. Left atrial size was moderately dilated.  4. The mitral valve is abnormal. Trivial mitral valve regurgitation.  5. The aortic valve was not well visualized. Aortic valve regurgitation is not visualized.  6. The inferior vena cava is normal in size with greater than 50% respiratory variability, suggesting right atrial pressure of 3 mmHg.  7. Cannot exclude a small PFO. Comparison(s): Prior images unable to be directly viewed, comparison made by report only. Changes from prior study are noted. 01/03/2022: LVEF 55-60%. FINDINGS  Left Ventricle: Left ventricular ejection  fraction, by estimation, is 60 to 65%. The left ventricle has normal function. The left ventricle has no regional wall motion abnormalities. Definity contrast agent was given IV to delineate the left ventricular  endocardial borders. The left ventricular internal cavity size was normal in size. There is mild left ventricular hypertrophy. Left ventricular diastolic parameters are consistent with Grade I diastolic dysfunction (impaired relaxation). Indeterminate filling pressures. Right Ventricle: The right ventricular size is normal. No increase in right ventricular wall thickness. Right ventricular systolic function is normal. Tricuspid regurgitation signal is inadequate for assessing PA pressure. Left Atrium: Left atrial size was moderately dilated. Right Atrium: Right atrial size was normal in size. Pericardium: There is no evidence of pericardial effusion. Mitral Valve: The mitral valve is abnormal. There is mild thickening of the mitral valve leaflet(s). Trivial mitral valve regurgitation. Tricuspid Valve: The tricuspid valve is not well visualized. Tricuspid valve regurgitation is not demonstrated. Aortic Valve: The aortic valve was not well visualized. Aortic valve regurgitation is not visualized. Pulmonic Valve: The pulmonic valve was not well visualized. Pulmonic valve regurgitation is not visualized. Aorta: The aortic root and ascending aorta are structurally normal, with no evidence of dilitation. Venous: The inferior vena cava is normal in size with greater than 50% respiratory variability, suggesting right atrial pressure of 3 mmHg. IAS/Shunts: The interatrial septum is aneurysmal. Cannot exclude a small PFO.  LEFT VENTRICLE PLAX 2D LVIDd:         4.00 cm   Diastology LVIDs:         2.50 cm   LV e' medial:    4.79 cm/s LV PW:         1.00 cm   LV E/e' medial:  17.5 LV IVS:        1.10 cm   LV e' lateral:   7.51 cm/s LVOT diam:     2.10 cm   LV E/e' lateral: 11.2 LV SV:         67 LV SV Index:   35 LVOT  Area:     3.46 cm  RIGHT VENTRICLE RV S prime:     11.70 cm/s TAPSE (M-mode): 1.8 cm LEFT ATRIUM             Index        RIGHT ATRIUM           Index LA diam:  3.70 cm 1.96 cm/m   RA Area:     19.30 cm LA Vol (A2C):   64.9 ml 34.43 ml/m  RA Volume:   57.10 ml  30.30 ml/m LA Vol (A4C):   95.8 ml 50.83 ml/m LA Biplane Vol: 78.8 ml 41.81 ml/m  AORTIC VALVE LVOT Vmax:   88.80 cm/s LVOT Vmean:  59.100 cm/s LVOT VTI:    0.193 m  AORTA Ao Root diam: 3.20 cm Ao Asc diam:  3.00 cm MITRAL VALVE MV Area (PHT): 3.85 cm    SHUNTS MV Decel Time: 197 msec    Systemic VTI:  0.19 m MV E velocity: 84.00 cm/s  Systemic Diam: 2.10 cm MV A velocity: 94.70 cm/s MV E/A ratio:  0.89 Lyman Bishop MD Electronically signed by Lyman Bishop MD Signature Date/Time: 08/16/2022/10:56:43 AM    Final     Anti-infectives: Anti-infectives (From admission, onward)    Start     Dose/Rate Route Frequency Ordered Stop   08/14/22 2000  piperacillin-tazobactam (ZOSYN) IVPB 3.375 g        3.375 g 12.5 mL/hr over 240 Minutes Intravenous Every 8 hours 08/14/22 1516     08/14/22 1145  piperacillin-tazobactam (ZOSYN) IVPB 3.375 g        3.375 g 12.5 mL/hr over 240 Minutes Intravenous  Once 08/14/22 1138 08/14/22 1632        Assessment/Plan Cholecystitis - patient continues to have significant nausea, which was his presenting symptoms, never was significant pain.   -cards have evaluated the patient and appreciate their input.  Not felt to be safe from anesthesia/surgery standpoint to go to sleep for intra-abdominal operation in the setting of complete heart block -with that said, we would like to place a perc chole drain to help his symptoms, but also moving forward if he does need a PPM, but doesn't have a drain, his cholecystitis could return and then put his pacemaker as risk of infection. -we will temporize/treat his cholecystitis with a perc drain for now, continue cards follow up/work up and then see him in 6-8 weeks  and see where things stand and if he is safe at that point to pursue interval cholecystectomy. -d/w patient, daughter who both agree with this plan and understand.  All questions answered   FEN - NPO/IVFs VTE - lovenox ID - [email or web address]   Complete heart block HTN DM H/o prostate cancer  I reviewed Consultant cardiology notes, hospitalist notes, last 24 h vitals and pain scores, last 24 h labs and trends, and last 24 h imaging results.    LOS: 3 days   Henreitta Cea, [facility] Surgery 08/17/2022, 8:11 AM Please see Amion for pager number during day hours 7:00am-4:30pm

## 2022-08-17 NOTE — Progress Notes (Signed)
Progress Note  Patient Name: Andre Stout Date of Encounter: 08/17/2022  [facility] Cardiologist: Evalina Field, MD   Subjective   Denies any chest pain, dyspnea, or lightheadedness.  Reports having nausea  Inpatient Medications    Scheduled Meds:  aspirin  81 mg Oral QHS   brimonidine  1 drop Both Eyes BID WC   dorzolamide-timolol  1 drop Both Eyes BID WC   enoxaparin (LOVENOX) injection  40 mg Subcutaneous Q24H   hydrALAZINE  10 mg Intravenous Q8H   insulin aspart  0-15 Units Subcutaneous TID WC   latanoprost  1 drop Both Eyes QHS   Continuous Infusions:  dextrose 5 % and 0.9 % NaCl with KCl 20 mEq/L 50 mL/hr at 08/17/22 0120   piperacillin-tazobactam (ZOSYN)  IV 3.375 g (08/17/22 0548)   PRN Meds: acetaminophen, hydrALAZINE, HYDROmorphone (DILAUDID) injection, ondansetron (ZOFRAN) IV   Vital Signs    Vitals:   08/17/22 0348 08/17/22 0420 08/17/22 0634 08/17/22 0752  BP: (!) 178/70 (!) 171/70 135/60 (!) 115/58  Pulse: 63 (!) 59 64 64  Resp: 13   18  Temp: 98.6 F (37 C)   98.7 F (37.1 C)  TempSrc: Oral   Oral  SpO2: 98%   98%  Weight:      Height:        Intake/Output Summary (Last 24 hours) at 08/17/2022 0840 Last data filed at 08/17/2022 0753 Gross per 24 hour  Intake 1297.1 ml  Output 2127 ml  Net -829.9 ml       08/17/2022   12:05 AM 08/16/2022    7:52 PM 08/14/2022    4:54 PM  Last 3 Weights  Weight (lbs) 196 lb 10.4 oz 199 lb 4.7 oz 183 lb 3.2 oz  Weight (kg) 89.2 kg 90.4 kg 83.1 kg      Telemetry    CHB with junctional escape rhythm in 60s - Personally Reviewed  ECG    No new ECG - Personally Reviewed  Physical Exam   GEN: No acute distress.   Neck: No JVD Cardiac: bradycardic, regular no murmurs, rubs, or gallops.  Respiratory: Clear to auscultation bilaterally. GI: Soft, nontender, non-distended  MS: No edema; No deformity. Neuro:  Nonfocal  Psych: Normal affect   Labs    High Sensitivity Troponin:   Recent Labs   Lab 07/31/22 2013 08/14/22 1749 08/14/22 1817  TROPONINIHS 25* 26* 23*      Chemistry Recent Labs  Lab 08/14/22 0122 08/14/22 1749 08/15/22 0553 08/16/22 0524  NA 137  --  140 138  K 3.8  --  3.5 3.8  CL 102  --  106 106  CO2 25  --  27 23  GLUCOSE 116*  --  84 133*  BUN 11  --  15 12  CREATININE 1.07  --  1.09 1.15  CALCIUM 9.3  --  8.5* 8.5*  MG  --  1.8  --  1.9  PROT 7.4  --  6.4* 6.5  ALBUMIN 3.1*  --  2.6* 2.7*  AST 16  --  10* 11*  ALT 11  --  11 9  ALKPHOS 87  --  66 71  BILITOT 1.6*  --  1.6* 1.3*  GFRNONAA >60  --  >60 >60  ANIONGAP 10  --  7 9     Lipids No results for input(s): "CHOL", "TRIG", "HDL", "LABVLDL", "LDLCALC", "CHOLHDL" in the last 168 hours.  Hematology Recent Labs  Lab 08/14/22 0122 08/15/22 9518  08/16/22 0524  WBC 19.9* 12.8* 10.8*  RBC 5.47 4.69 4.55  HGB 15.0 12.9* 12.6*  HCT 45.8 39.6 39.0  MCV 83.7 84.4 85.7  MCH 27.4 27.5 27.7  MCHC 32.8 32.6 32.3  RDW 14.2 14.4 14.3  PLT 334 302 327    Thyroid No results for input(s): "TSH", "FREET4" in the last 168 hours.  BNPNo results for input(s): "BNP", "PROBNP" in the last 168 hours.  DDimer No results for input(s): "DDIMER" in the last 168 hours.   Radiology    ECHOCARDIOGRAM COMPLETE  Result Date: 08/16/2022    ECHOCARDIOGRAM REPORT   Patient Name:   Andre Stout Date of Exam: 08/16/2022 Medical Rec #:  [id number]      Height:       64.0 in Accession #:    [id number]     Weight:       183.2 lb Date of Birth:  16-May-1940     BSA:          1.885 m Patient Age:    82 years       BP:           200/91 mmHg Patient Gender: M              HR:           65 bpm. Exam Location:  Inpatient Procedure: 2D Echo, Color Doppler, Cardiac Doppler and Intracardiac            Opacification Agent Indications:    Heart Block  History:        Patient has prior history of Echocardiogram examinations, most                 recent 01/03/2022. Risk Factors:Hypertension and Diabetes.  Sonographer:    Raquel Sarna  Senior RDCS Referring Phys: [id number] Donato Heinz  Sonographer Comments: Suboptimal apical window. IMPRESSIONS  1. Left ventricular ejection fraction, by estimation, is 60 to 65%. The left ventricle has normal function. The left ventricle has no regional wall motion abnormalities. There is mild left ventricular hypertrophy. Left ventricular diastolic parameters are consistent with Grade I diastolic dysfunction (impaired relaxation).  2. Right ventricular systolic function is normal. The right ventricular size is normal. Tricuspid regurgitation signal is inadequate for assessing PA pressure.  3. Left atrial size was moderately dilated.  4. The mitral valve is abnormal. Trivial mitral valve regurgitation.  5. The aortic valve was not well visualized. Aortic valve regurgitation is not visualized.  6. The inferior vena cava is normal in size with greater than 50% respiratory variability, suggesting right atrial pressure of 3 mmHg.  7. Cannot exclude a small PFO. Comparison(s): Prior images unable to be directly viewed, comparison made by report only. Changes from prior study are noted. 01/03/2022: LVEF 55-60%. FINDINGS  Left Ventricle: Left ventricular ejection fraction, by estimation, is 60 to 65%. The left ventricle has normal function. The left ventricle has no regional wall motion abnormalities. Definity contrast agent was given IV to delineate the left ventricular  endocardial borders. The left ventricular internal cavity size was normal in size. There is mild left ventricular hypertrophy. Left ventricular diastolic parameters are consistent with Grade I diastolic dysfunction (impaired relaxation). Indeterminate filling pressures. Right Ventricle: The right ventricular size is normal. No increase in right ventricular wall thickness. Right ventricular systolic function is normal. Tricuspid regurgitation signal is inadequate for assessing PA pressure. Left Atrium: Left atrial size was moderately dilated. Right  Atrium: Right atrial size was normal in  size. Pericardium: There is no evidence of pericardial effusion. Mitral Valve: The mitral valve is abnormal. There is mild thickening of the mitral valve leaflet(s). Trivial mitral valve regurgitation. Tricuspid Valve: The tricuspid valve is not well visualized. Tricuspid valve regurgitation is not demonstrated. Aortic Valve: The aortic valve was not well visualized. Aortic valve regurgitation is not visualized. Pulmonic Valve: The pulmonic valve was not well visualized. Pulmonic valve regurgitation is not visualized. Aorta: The aortic root and ascending aorta are structurally normal, with no evidence of dilitation. Venous: The inferior vena cava is normal in size with greater than 50% respiratory variability, suggesting right atrial pressure of 3 mmHg. IAS/Shunts: The interatrial septum is aneurysmal. Cannot exclude a small PFO.  LEFT VENTRICLE PLAX 2D LVIDd:         4.00 cm   Diastology LVIDs:         2.50 cm   LV e' medial:    4.79 cm/s LV PW:         1.00 cm   LV E/e' medial:  17.5 LV IVS:        1.10 cm   LV e' lateral:   7.51 cm/s LVOT diam:     2.10 cm   LV E/e' lateral: 11.2 LV SV:         67 LV SV Index:   35 LVOT Area:     3.46 cm  RIGHT VENTRICLE RV S prime:     11.70 cm/s TAPSE (M-mode): 1.8 cm LEFT ATRIUM             Index        RIGHT ATRIUM           Index LA diam:        3.70 cm 1.96 cm/m   RA Area:     19.30 cm LA Vol (A2C):   64.9 ml 34.43 ml/m  RA Volume:   57.10 ml  30.30 ml/m LA Vol (A4C):   95.8 ml 50.83 ml/m LA Biplane Vol: 78.8 ml 41.81 ml/m  AORTIC VALVE LVOT Vmax:   88.80 cm/s LVOT Vmean:  59.100 cm/s LVOT VTI:    0.193 m  AORTA Ao Root diam: 3.20 cm Ao Asc diam:  3.00 cm MITRAL VALVE MV Area (PHT): 3.85 cm    SHUNTS MV Decel Time: 197 msec    Systemic VTI:  0.19 m MV E velocity: 84.00 cm/s  Systemic Diam: 2.10 cm MV A velocity: 94.70 cm/s MV E/A ratio:  0.89 Lyman Bishop MD Electronically signed by Lyman Bishop MD Signature Date/Time:  08/16/2022/10:56:43 AM    Final     Cardiac Studies     Patient Profile     82 y.o. 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  He was recently admitted earlier this month for this.  Electrolytes were repleted and heart rates improved but he remained in complete heart block.  Given normal ventricular rate, no PPM was recommended.  He was recently seen in the EP clinic at which time he reported intermittent dizziness, lightheadedness at home.  He was still complete heart block but rates were in the 70s.  Therefore, outpatient monitor  was recommended to see if symptoms correlated with heart rate dysfunction.  He now presents with acute cholecystitis.  He remains in complete heart block with controlled ventricular rate.  Electrolytes within normal limits. General Surgery has been consulted and is considering laparoscopic cholecystectomy versus percutaneous drain would like input given ongoing complete heart block. Patient is mostly asymptomatic with the CHB. He notes occasional very mild episodes of lightheadedness/dizziness but nothing significant. No recurrent syncope since last admission. No angina or acute CHF symptoms. Per Revised Cardiac Risk Index, considered moderate risk with 6.6% chance of adverse outcome given insulin dependent diabetes and potential and intraperitoneal surgery. However, this does not take into account patient's CHB.  -Echocardiogram 8/17 shows EF 60 to 65%, normal RV function -Discussed with EP, recommend proceeding with surgery if needed and will treat bradycardia if it occurs.  General surgery  recommending per chole drain.  EP will formally evaluate patient today now that transferred to [facility]   Elevated Troponin High-sensitivity troponin minimally elevated and flat in the 20s which is were it was during recent admission earlier this month. EKG shows no acute ischemic changes compared to prior tracings. No chest pain. Not consistent with ACS. Suspect due to underlying illnesses. No ischemic work-up planned at this time.   Hypertension BP improved, 115/58 this morning   Otherwise, per primary team: - Acute cholecystitis - Type 2 diabetes mellitus  For questions or updates, please contact Edgar Please consult [email or web address] for contact info under        Signed, Donato Heinz, MD  08/17/2022, 8:40 AM

## 2022-08-17 NOTE — TOC Progression Note (Signed)
Transition of Care [facility]) - Progression Note    Patient Details  Name: Andre Stout MRN: [id number] Date of Birth: Aug 20, 1940  [facility] CM/SW Contact  Tom-Johnson, Renea Ee, RN Phone Number: 08/17/2022, 1:43 PM  Clinical Narrative:     CM spoke with patient and daughter, [name] at bedside about needs for post hospital transition. Admitted for Cholecystitis. Noted vomiting at bedside. Surgery following and plan is to place a perc chole drain to help with symptoms.  From home with his grandson and his wife. Has a cane, walker and shower seat at home. Patient was active with [facility] before and would like to use their services if home health is recommended.  PCP is [name], FNP and uses [facility] on [address].  No TOC needs noted at this time. CM will continue to follow as patient progresses with care.     Expected Discharge Plan: Home/Self Care Barriers to Discharge: Continued Medical Work up  Expected Discharge Plan and Services Expected Discharge Plan: Home/Self Care   Discharge Planning Services: CM Consult   Living arrangements for the past 2 months: Single Family Home                                       Social Determinants of Health (SDOH) Interventions    Readmission Risk Interventions    08/15/2022    9:25 AM  Readmission Risk Prevention Plan  Transportation Screening Complete  PCP or Specialist Appt within 5-7 Days Complete  Home Care Screening Complete  Medication Review (RN CM) Complete

## 2022-08-17 NOTE — Consult Note (Signed)
Chief Complaint: Acute cholecystitis. Request is for cholecystomy tube placement  Referring Physician(s): Dr. Dillard Cannon  Supervising Physician: Michaelle Birks  Patient Status: [facility] - In-pt  History of Present Illness: Andre Stout is a 82 y.o. male History of HTN, DM, prostate cancer, IDDM, AV block. Presented to the ED at [facility] with RUQ pain and vomiting X 3 days. [place] from 8.17.23 reads Cholelithiasis with findings suspicious for acute cholecystitis. CT Abd pelvis from 8.15.23 reads  Cholelithiasis with gallbladder wall thickening and surrounding inflammatory changes, possible acute cholecystitis. Ultrasound is recommended for further evaluation. Patient was deemed not to be a surgical candidate at this time. Team is requesting a cholecystotomy tube placement.   Daughter at bedside. Patient alert and laying in bed, calm.Endorses nausea and vomiting.  Denies any fevers, headache, chest pain, SOB, cough, abdominal pain, or bleeding. Return precautions and treatment recommendations and follow-up discussed with the patient and his daughter. Both who are agreeable with the plan.    Past Medical History:  Diagnosis Date   Diabetes mellitus without complication ([place])    Glaucoma    Heart block    complete heart block   Hypertension    Prostate cancer ([place])    Been 3-4 years ago    Past Surgical History:  Procedure Laterality Date   CATARACT EXTRACTION Bilateral    INSERTION PROSTATE RADIATION SEED      Allergies: Patient has no known allergies.  Medications: Prior to Admission medications   Medication Sig Start Date End Date Taking? Authorizing Provider  acetaminophen (TYLENOL) 500 MG tablet Take 1,000 mg by mouth daily as needed (pain).   Yes [provider]  aspirin 81 MG chewable tablet Chew 1 tablet (81 mg total) by mouth daily. Patient taking differently: Chew 81 mg by mouth at bedtime. 01/06/22  Yes Swayze, Ava, DO  brimonidine (ALPHAGAN) 0.2 % ophthalmic solution  Place 1 drop into both eyes 2 (two) times daily with breakfast and lunch. 12/18/21  Yes [provider]  cetirizine (ZYRTEC) 10 MG tablet Take 10 mg by mouth daily as needed (seasonal allergies).   Yes [provider]  insulin NPH-regular Human (70-30) 100 UNIT/ML injection Inject 40 Units into the skin 2 (two) times daily with a meal. Patient taking differently: Inject 36 Units into the skin 2 (two) times daily with a meal. 08/04/22  Yes Pahwani, Ravi, MD  latanoprost (XALATAN) 0.005 % ophthalmic solution Place 1 drop into both eyes at bedtime. 01/01/22  Yes [provider]  magnesium oxide (MAG-OX) 400 (240 Mg) MG tablet Take 400 mg by mouth at bedtime. 06/06/22  Yes [provider]  Multiple Vitamins-Minerals (PRESERVISION AREDS 2) CAPS Take 1 capsule by mouth at bedtime.   Yes [provider]  amLODipine (NORVASC) 10 MG tablet Take 1 tablet (10 mg total) by mouth daily. Patient not taking: Reported on 08/14/2022 08/05/22 09/04/22  Darliss Cheney, MD  BD INSULIN SYRINGE U/F 31G X 5/16" 1 ML MISC USE 2 DAILY 08/24/21   Renato Shin, MD  dorzolamide-timolol (COSOPT) 22.3-6.8 MG/ML ophthalmic solution Place 1 drop into both eyes 2 (two) times daily with breakfast and lunch. 05/18/22   [provider]  lisinopril (ZESTRIL) 20 MG tablet Take 1 tablet (20 mg total) by mouth daily. Patient not taking: Reported on 08/14/2022 08/05/22 09/04/22  Darliss Cheney, MD  [facility] DELICA LANCETS 12I MISC Use to check blood sugar 4 times per day. Dx code: E11.9 03/27/16   Renato Shin, MD  [facility]  ULTRA test strip USE TO MONITOR GLUCOSE LEVELS 4 TIMES PER DAY E11.9 06/10/19   Renato Shin, MD     Family History  Problem Relation Age of Onset   Diabetes Father    Diabetes Paternal Grandfather     Social History   Socioeconomic History   Marital status: Widowed    Spouse name: Not on file   Number of children: 1   Years of education: 47   Highest education level: Not  on file  Occupational History   Occupation: Retired  Tobacco Use   Smoking status: Former    Packs/day: 0.50    Years: 4.00    Total pack years: 2.00    Types: Cigarettes    Quit date: 10/29/1974    Years since quitting: 47.8   Smokeless tobacco: Never  Vaping Use   Vaping Use: Never used  Substance and Sexual Activity   Alcohol use: No   Drug use: No   Sexual activity: Not Currently  Other Topics Concern   Not on file  Social History Narrative   Born and raised in [place], [place]. Currently reside in a private residence by himself. Daughter lives in the area. No live. Fun: hunt and fish   Denies religious beliefs that would effect health care.    Social Determinants of Health   Financial Resource Strain: Low Risk  (03/26/2022)   Overall Financial Resource Strain (CARDIA)    Difficulty of Paying Living Expenses: Not hard at all  Food Insecurity: No Food Insecurity (03/26/2022)   Hunger Vital Sign    Worried About Running Out of Food in the Last Year: Never true    Ran Out of Food in the Last Year: Never true  Transportation Needs: No Transportation Needs (03/26/2022)   PRAPARE - Hydrologist (Medical): No    Lack of Transportation (Non-Medical): No  Physical Activity: Inactive (03/26/2022)   Exercise Vital Sign    Days of Exercise per Week: 0 days    Minutes of Exercise per Session: 0 min  Stress: No Stress Concern Present (03/26/2022)   [place]    Feeling of Stress : Not at all  Social Connections: Unknown (05/28/2019)   Social Connection and Isolation Panel [NHANES]    Frequency of Communication with Friends and Family: More than three times a week    Frequency of Social Gatherings with Friends and Family: More than three times a week    Attends Religious Services: Not on file    Active Member of Clubs or Organizations: Yes    Attends Archivist Meetings: More than 4  times per year    Marital Status: Widowed    Review of Systems: A 12 point ROS discussed and pertinent positives are indicated in the HPI above.  All other systems are negative.  Review of Systems  Constitutional:  Negative for fever.  HENT:  Negative for congestion.   Respiratory:  Negative for cough and shortness of breath.   Cardiovascular:  Negative for chest pain.  Gastrointestinal:  Positive for nausea and vomiting. Negative for abdominal pain.  Neurological:  Negative for headaches.  Psychiatric/Behavioral:  Negative for behavioral problems and confusion.     Vital Signs: BP (!) 115/58 (BP Location: Left Arm)   Pulse 64   Temp 98.7 F (37.1 C) (Oral)   Resp 18   Ht '5\' 5"'$  (1.651 m)   Wt 196 lb 10.4 oz (89.2 kg)  SpO2 98%   BMI 32.72 kg/m    Physical Exam Vitals and nursing note reviewed.  Constitutional:      Appearance: He is well-developed.  HENT:     Head: Normocephalic.  Cardiovascular:     Rate and Rhythm: Normal rate and regular rhythm.  Pulmonary:     Effort: Pulmonary effort is normal.     Breath sounds: Normal breath sounds.  Musculoskeletal:        General: Normal range of motion.     Cervical back: Normal range of motion.  Skin:    General: Skin is dry.  Neurological:     Mental Status: He is alert and oriented to person, place, and time.     Imaging: ECHOCARDIOGRAM COMPLETE  Result Date: 08/16/2022    ECHOCARDIOGRAM REPORT   Patient Name:   MARTESE VANATTA Date of Exam: 08/16/2022 Medical Rec #:  [id number]      Height:       64.0 in Accession #:    [id number]     Weight:       183.2 lb Date of Birth:  May 14, 1940     BSA:          1.885 m Patient Age:    84 years       BP:           200/91 mmHg Patient Gender: M              HR:           65 bpm. Exam Location:  Inpatient Procedure: 2D Echo, Color Doppler, Cardiac Doppler and Intracardiac            Opacification Agent Indications:    Heart Block  History:        Patient has prior history of  Echocardiogram examinations, most                 recent 01/03/2022. Risk Factors:Hypertension and Diabetes.  Sonographer:    Raquel Sarna Senior RDCS Referring Phys: [id number] Donato Heinz  Sonographer Comments: Suboptimal apical window. IMPRESSIONS  1. Left ventricular ejection fraction, by estimation, is 60 to 65%. The left ventricle has normal function. The left ventricle has no regional wall motion abnormalities. There is mild left ventricular hypertrophy. Left ventricular diastolic parameters are consistent with Grade I diastolic dysfunction (impaired relaxation).  2. Right ventricular systolic function is normal. The right ventricular size is normal. Tricuspid regurgitation signal is inadequate for assessing PA pressure.  3. Left atrial size was moderately dilated.  4. The mitral valve is abnormal. Trivial mitral valve regurgitation.  5. The aortic valve was not well visualized. Aortic valve regurgitation is not visualized.  6. The inferior vena cava is normal in size with greater than 50% respiratory variability, suggesting right atrial pressure of 3 mmHg.  7. Cannot exclude a small PFO. Comparison(s): Prior images unable to be directly viewed, comparison made by report only. Changes from prior study are noted. 01/03/2022: LVEF 55-60%. FINDINGS  Left Ventricle: Left ventricular ejection fraction, by estimation, is 60 to 65%. The left ventricle has normal function. The left ventricle has no regional wall motion abnormalities. Definity contrast agent was given IV to delineate the left ventricular  endocardial borders. The left ventricular internal cavity size was normal in size. There is mild left ventricular hypertrophy. Left ventricular diastolic parameters are consistent with Grade I diastolic dysfunction (impaired relaxation). Indeterminate filling pressures. Right Ventricle: The right ventricular size is normal. No increase in right ventricular  wall thickness. Right ventricular systolic function is normal.  Tricuspid regurgitation signal is inadequate for assessing PA pressure. Left Atrium: Left atrial size was moderately dilated. Right Atrium: Right atrial size was normal in size. Pericardium: There is no evidence of pericardial effusion. Mitral Valve: The mitral valve is abnormal. There is mild thickening of the mitral valve leaflet(s). Trivial mitral valve regurgitation. Tricuspid Valve: The tricuspid valve is not well visualized. Tricuspid valve regurgitation is not demonstrated. Aortic Valve: The aortic valve was not well visualized. Aortic valve regurgitation is not visualized. Pulmonic Valve: The pulmonic valve was not well visualized. Pulmonic valve regurgitation is not visualized. Aorta: The aortic root and ascending aorta are structurally normal, with no evidence of dilitation. Venous: The inferior vena cava is normal in size with greater than 50% respiratory variability, suggesting right atrial pressure of 3 mmHg. IAS/Shunts: The interatrial septum is aneurysmal. Cannot exclude a small PFO.  LEFT VENTRICLE PLAX 2D LVIDd:         4.00 cm   Diastology LVIDs:         2.50 cm   LV e' medial:    4.79 cm/s LV PW:         1.00 cm   LV E/e' medial:  17.5 LV IVS:        1.10 cm   LV e' lateral:   7.51 cm/s LVOT diam:     2.10 cm   LV E/e' lateral: 11.2 LV SV:         67 LV SV Index:   35 LVOT Area:     3.46 cm  RIGHT VENTRICLE RV S prime:     11.70 cm/s TAPSE (M-mode): 1.8 cm LEFT ATRIUM             Index        RIGHT ATRIUM           Index LA diam:        3.70 cm 1.96 cm/m   RA Area:     19.30 cm LA Vol (A2C):   64.9 ml 34.43 ml/m  RA Volume:   57.10 ml  30.30 ml/m LA Vol (A4C):   95.8 ml 50.83 ml/m LA Biplane Vol: 78.8 ml 41.81 ml/m  AORTIC VALVE LVOT Vmax:   88.80 cm/s LVOT Vmean:  59.100 cm/s LVOT VTI:    0.193 m  AORTA Ao Root diam: 3.20 cm Ao Asc diam:  3.00 cm MITRAL VALVE MV Area (PHT): 3.85 cm    SHUNTS MV Decel Time: 197 msec    Systemic VTI:  0.19 m MV E velocity: 84.00 cm/s  Systemic Diam: 2.10 cm  MV A velocity: 94.70 cm/s MV E/A ratio:  0.89 Lyman Bishop MD Electronically signed by Lyman Bishop MD Signature Date/Time: 08/16/2022/10:56:43 AM    Final    US Abdomen Limited RUQ (LIVER/GB)  Result Date: 08/14/2022 CLINICAL DATA:  Right upper quadrant pain and vomiting EXAM: ULTRASOUND ABDOMEN LIMITED RIGHT UPPER QUADRANT COMPARISON:  CT of earlier today FINDINGS: Gallbladder: Small gallstones. Wall thickening at 5 mm. Trace pericholecystic edema and fluid, including on CT. The technologist describes tenderness with gallbladder palpation. Common bile duct: Diameter: Normal, 5 mm Liver: No focal lesion identified. Within normal limits in parenchymal echogenicity. Portal vein is patent on color Doppler imaging with normal direction of blood flow towards the liver. Other: None. IMPRESSION: Cholelithiasis with findings suspicious for acute cholecystitis. Electronically Signed   By: Abigail Miyamoto M.D.   On: 08/14/2022 10:43   CT ABDOMEN  PELVIS W CONTRAST  Result Date: 08/14/2022 CLINICAL DATA:  Abdominal pain, constipation, and vomiting. EXAM: CT ABDOMEN AND PELVIS WITH CONTRAST TECHNIQUE: Multidetector CT imaging of the abdomen and pelvis was performed using the standard protocol following bolus administration of intravenous contrast. RADIATION DOSE REDUCTION: This exam was performed according to the departmental dose-optimization program which includes automated exposure control, adjustment of the mA and/or kV according to patient size and/or use of iterative reconstruction technique. CONTRAST:  179m OMNIPAQUE IOHEXOL 300 MG/ML  SOLN COMPARISON:  None Available. FINDINGS: Lower chest: The heart is enlarged and a few coronary artery calcifications are noted. Mild atelectasis or scarring is present at the lung bases. Hepatobiliary: No focal liver abnormality. No biliary ductal dilatation. Stones are present within the gallbladder. There is gallbladder wall thickening with pericholecystic edema and fat  stranding Pancreas: Pancreatic atrophy. No pancreatic ductal dilatation or surrounding inflammatory changes. Spleen: Normal in size without focal abnormality. Adrenals/Urinary Tract: No adrenal nodule or mass. The kidneys enhance symmetrically. A large cyst with thin rim calcification is noted in the lower pole the right kidney measuring 9.8 cm. No renal calculus or hydronephrosis. The bladder is unremarkable. Stomach/Bowel: There is thickening of the walls of the distal esophagus and a small hiatal hernia is noted. The stomach is otherwise within normal limits. No bowel obstruction, free air, or pneumatosis. The appendix is not visualized on exam, however there are no inflammatory changes in the right lower quadrant. Vascular/Lymphatic: Aortic atherosclerosis. No abdominal or pelvic lymphadenopathy by size criteria. Reproductive: Metallic densities are present in the prostate gland, possibly representing radiation therapy seeds. Other: Fat containing left inguinal and umbilical hernias are noted. No ascites. Musculoskeletal: Increased density is noted in the retroareolar regions bilaterally, greater on the right than on the left which may be due to patient positioning, possible gynecomastia. Degenerative changes are present in the thoracolumbar spine. No acute osseous abnormality. IMPRESSION: 1. Cholelithiasis with gallbladder wall thickening and surrounding inflammatory changes, possible acute cholecystitis. Ultrasound is recommended for further evaluation. 2. Thickening of the walls of the distal esophagus with small hiatal hernia. Endoscopy is suggested for further evaluation on follow-up. 3. Right renal cyst. 4. Aortic atherosclerosis. Electronically Signed   By: LBrett FairyM.D.   On: 08/14/2022 04:55   MR BRAIN W WO CONTRAST  Result Date: 08/01/2022 CLINICAL DATA:  Abnormal CT with possible meningioma or schwannoma EXAM: MRI HEAD WITHOUT AND WITH CONTRAST TECHNIQUE: Multiplanar, multiecho pulse sequences  of the brain and surrounding structures were obtained without and with intravenous contrast. CONTRAST:  830mGADAVIST GADOBUTROL 1 MMOL/ML IV SOLN COMPARISON:  None Available. FINDINGS: Motion artifact is present. Brain: There is a dural-based enhancing extra-axial lesion of the right cerebellopontine angle cistern measuring about 1.8 x 1 x 2 cm. There is some extension into the internal auditory canal along the dura. The 7/8 nerve complex is poorly evaluated on these large field-of-view, motion degraded images. No significant mass effect on the adjacent brachium pontis. No parenchymal edema. There is no acute infarction or intracranial hemorrhage. There is no hydrocephalus or extra-axial fluid collection. Prominence of the ventricles and sulci reflects parenchymal volume loss. Patchy and confluent areas of T2 hyperintensity in the supratentorial and pontine white matter are nonspecific may reflect mild to moderate chronic microvascular ischemic changes. Vascular: Major vessel flow voids at the skull base are preserved. Skull and upper cervical spine: Normal marrow signal is preserved. Sinuses/Orbits: Paranasal sinus mucosal thickening. Bilateral lens replacements. Other: Sella is unremarkable.  Mastoid air cells  are clear. 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 No significant mass effect or parenchymal edema. 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 Electronically Signed   By: Macy Mis M.D.   On: 08/01/2022 09:26   CT HEAD WO CONTRAST (5MM)  Result Date: 07/31/2022 CLINICAL DATA:  Possible seizure, memory loss. EXAM: CT HEAD WITHOUT CONTRAST TECHNIQUE: Contiguous axial images were obtained from the base of the skull through the vertex without intravenous contrast. RADIATION DOSE REDUCTION: This exam was performed according to the departmental dose-optimization program which includes automated exposure control, adjustment of the mA  and/or kV according to patient size and/or use of iterative reconstruction technique. COMPARISON:  01/02/2022. FINDINGS: Brain: No acute intracranial hemorrhage, midline shift or mass effect. No extra-axial fluid collection. Generalized atrophy is noted. Patchy and confluent white matter hypodensities are noted bilaterally and not significantly changed from the prior exam. A hyperdense structure is noted in the CP angle on the right measuring 1.6 x 0.8 cm. No hydrocephalus. Vascular: Atherosclerotic calcification of the carotid siphons. No hyperdense vessel. Skull: Normal. Negative for fracture or focal lesion. Sinuses/Orbits: Mucosal thickening is noted in the ethmoid air cells. The orbits are within normal limits. Other: Increased density is noted in the scalp over the parietal bone on the right, unchanged from 2021. IMPRESSION: 1. No acute intracranial hemorrhage. 2. Atrophy with chronic microvascular ischemic changes. 3. Dural-based asymmetry in the CP angle on the right, possible meningioma or schwannoma. MRI is recommended for further characterization on follow-up. Electronically Signed   By: Brett Fairy M.D.   On: 07/31/2022 21:18   DG Chest Port 1 View  Result Date: 07/31/2022 CLINICAL DATA:  Altered mental status/seizures EXAM: PORTABLE CHEST 1 VIEW COMPARISON:  Radiographs 10/09/2020 FINDINGS: Low lung volumes. Unchanged borderline enlargement of the cardiomediastinal silhouette. Aortic calcifications. Prominent fat pad at the cardiac apex. No focal consolidation, pleural effusion, or pneumothorax. IMPRESSION: No acute abnormality.  Hypoinflation. Electronically Signed   By: Placido Sou M.D.   On: 07/31/2022 20:39    Labs:  CBC: Recent Labs    07/31/22 2013 08/14/22 0122 08/15/22 0553 08/16/22 0524  WBC 8.7 19.9* 12.8* 10.8*  HGB 14.7 15.0 12.9* 12.6*  HCT 46.0 45.8 39.6 39.0  PLT 340 334 302 327    COAGS: Recent Labs    08/14/22 1200  INR 1.3*  APTT 31    BMP: Recent  Labs    08/04/22 0144 08/14/22 0122 08/15/22 0553 08/16/22 0524  NA 133* 137 140 138  K 4.1 3.8 3.5 3.8  CL 101 102 106 106  CO2 '23 25 27 23  '$ GLUCOSE 220* 116* 84 133*  BUN 6* '11 15 12  '$ CALCIUM 8.8* 9.3 8.5* 8.5*  CREATININE 1.07 1.07 1.09 1.15  GFRNONAA >60 >60 >60 >60    LIVER FUNCTION TESTS: Recent Labs    07/31/22 2013 08/14/22 0122 08/15/22 0553 08/16/22 0524  BILITOT 1.3* 1.6* 1.6* 1.3*  AST 11* 16 10* 11*  ALT '12 11 11 9  '$ ALKPHOS 77 87 66 71  PROT 6.5 7.4 6.4* 6.5  ALBUMIN 2.9* 3.1* 2.6* 2.7*     Assessment and Plan:  82 y.o. male inpatient. History of HTN, DM, prostate cancer, IDDM, AV block. Presented to the ED at [facility] with RUQ pain and vomiting X 3 days. [place] from 8.17.23 reads Cholelithiasis with findings suspicious for acute cholecystitis. CT Abd pelvis from 8.15.23 reads  Cholelithiasis with gallbladder wall thickening and surrounding inflammatory  changes, possible acute cholecystitis. Ultrasound is recommended for further evaluation. Patient was deemed not to be a surgical candidate at this time. Team is requesting a cholecystotomy tube placement.   He is on ASA and lovenox prophylactic  last dose given on 8.17.23. WBC 10.8. afebrile. NKDA. Patient has been NPO since midnight. Okay to proceed per IR Attending.   Risks and benefits discussed with the patient including bleeding, infection, damage to adjacent structures, bowel perforation/fistula connection, and sepsis.  All of the patient's and his daughter's questions were answered, patient is agreeable to proceed. Consent signed and in chart.   Thank you for this interesting consult.  I greatly enjoyed meeting LEE KUANG and look forward to participating in their care.  A copy of this report was sent to the requesting provider on this date.  Electronically Signed: Jacqualine Mau, NP 08/17/2022, 11:28 AM   I spent a total of 40 Minutes    in face to face in clinical consultation, greater than 50%  of which was counseling/coordinating care for cholecystomy tube placement

## 2022-08-18 DIAGNOSIS — K819 Cholecystitis, unspecified: Secondary | ICD-10-CM | POA: Diagnosis not present

## 2022-08-18 LAB — GLUCOSE, CAPILLARY
Glucose-Capillary: 157 mg/dL — ABNORMAL HIGH (ref 70–99)
Glucose-Capillary: 176 mg/dL — ABNORMAL HIGH (ref 70–99)
Glucose-Capillary: 178 mg/dL — ABNORMAL HIGH (ref 70–99)
Glucose-Capillary: 183 mg/dL — ABNORMAL HIGH (ref 70–99)
Glucose-Capillary: 197 mg/dL — ABNORMAL HIGH (ref 70–99)
Glucose-Capillary: 205 mg/dL — ABNORMAL HIGH (ref 70–99)
Glucose-Capillary: 230 mg/dL — ABNORMAL HIGH (ref 70–99)
Glucose-Capillary: 240 mg/dL — ABNORMAL HIGH (ref 70–99)
Glucose-Capillary: 242 mg/dL — ABNORMAL HIGH (ref 70–99)
Glucose-Capillary: 274 mg/dL — ABNORMAL HIGH (ref 70–99)
Glucose-Capillary: 289 mg/dL — ABNORMAL HIGH (ref 70–99)
Glucose-Capillary: 326 mg/dL — ABNORMAL HIGH (ref 70–99)
Glucose-Capillary: 344 mg/dL — ABNORMAL HIGH (ref 70–99)
Glucose-Capillary: 426 mg/dL — ABNORMAL HIGH (ref 70–99)
Glucose-Capillary: 466 mg/dL — ABNORMAL HIGH (ref 70–99)
Glucose-Capillary: 487 mg/dL — ABNORMAL HIGH (ref 70–99)

## 2022-08-18 LAB — COMPREHENSIVE METABOLIC PANEL
ALT: 10 U/L (ref 0–44)
AST: 10 U/L — ABNORMAL LOW (ref 15–41)
Albumin: 2.5 g/dL — ABNORMAL LOW (ref 3.5–5.0)
Alkaline Phosphatase: 82 U/L (ref 38–126)
Anion gap: 21 — ABNORMAL HIGH (ref 5–15)
BUN: 12 mg/dL (ref 8–23)
CO2: 12 mmol/L — ABNORMAL LOW (ref 22–32)
Calcium: 8.9 mg/dL (ref 8.9–10.3)
Chloride: 103 mmol/L (ref 98–111)
Creatinine, Ser: 1.57 mg/dL — ABNORMAL HIGH (ref 0.61–1.24)
GFR, Estimated: 44 mL/min — ABNORMAL LOW (ref >60)
Glucose, Bld: 329 mg/dL — ABNORMAL HIGH (ref 70–99)
Potassium: 4.4 mmol/L (ref 3.5–5.1)
Sodium: 136 mmol/L (ref 135–145)
Total Bilirubin: 2.3 mg/dL — ABNORMAL HIGH (ref 0.3–1.2)
Total Protein: 6.4 g/dL — ABNORMAL LOW (ref 6.5–8.1)

## 2022-08-18 LAB — BASIC METABOLIC PANEL
Anion gap: 19 — ABNORMAL HIGH (ref 5–15)
Anion gap: 13 (ref 5–15)
Anion gap: 9 (ref 5–15)
BUN: 20 mg/dL (ref 8–23)
BUN: 22 mg/dL (ref 8–23)
BUN: 20 mg/dL (ref 8–23)
CO2: 15 mmol/L — ABNORMAL LOW (ref 22–32)
CO2: 18 mmol/L — ABNORMAL LOW (ref 22–32)
CO2: 22 mmol/L (ref 22–32)
Calcium: 9.2 mg/dL (ref 8.9–10.3)
Calcium: 9.3 mg/dL (ref 8.9–10.3)
Calcium: 9.1 mg/dL (ref 8.9–10.3)
Chloride: 103 mmol/L (ref 98–111)
Chloride: 109 mmol/L (ref 98–111)
Chloride: 106 mmol/L (ref 98–111)
Creatinine, Ser: 2.09 mg/dL — ABNORMAL HIGH (ref 0.61–1.24)
Creatinine, Ser: 1.97 mg/dL — ABNORMAL HIGH (ref 0.61–1.24)
Creatinine, Ser: 1.69 mg/dL — ABNORMAL HIGH (ref 0.61–1.24)
GFR, Estimated: 31 mL/min — ABNORMAL LOW (ref >60)
GFR, Estimated: 34 mL/min — ABNORMAL LOW (ref >60)
GFR, Estimated: 40 mL/min — ABNORMAL LOW (ref >60)
Glucose, Bld: 401 mg/dL — ABNORMAL HIGH (ref 70–99)
Glucose, Bld: 209 mg/dL — ABNORMAL HIGH (ref 70–99)
Glucose, Bld: 228 mg/dL — ABNORMAL HIGH (ref 70–99)
Potassium: 4 mmol/L (ref 3.5–5.1)
Potassium: 3.7 mmol/L (ref 3.5–5.1)
Potassium: 3.4 mmol/L — ABNORMAL LOW (ref 3.5–5.1)
Sodium: 137 mmol/L (ref 135–145)
Sodium: 140 mmol/L (ref 135–145)
Sodium: 137 mmol/L (ref 135–145)

## 2022-08-18 LAB — CBC WITH DIFFERENTIAL/PLATELET
Abs Immature Granulocytes: 0.14 10*3/uL — ABNORMAL HIGH (ref 0.00–0.07)
Basophils Absolute: 0.1 10*3/uL (ref 0.0–0.1)
Basophils Relative: 1 %
Eosinophils Absolute: 0.1 10*3/uL (ref 0.0–0.5)
Eosinophils Relative: 0 %
HCT: 43.9 % (ref 39.0–52.0)
Hemoglobin: 14.3 g/dL (ref 13.0–17.0)
Immature Granulocytes: 1 %
Lymphocytes Relative: 14 %
Lymphs Abs: 2.3 10*3/uL (ref 0.7–4.0)
MCH: 27.2 pg (ref 26.0–34.0)
MCHC: 32.6 g/dL (ref 30.0–36.0)
MCV: 83.5 fL (ref 80.0–100.0)
Monocytes Absolute: 0.9 10*3/uL (ref 0.1–1.0)
Monocytes Relative: 5 %
Neutro Abs: 13.5 10*3/uL — ABNORMAL HIGH (ref 1.7–7.7)
Neutrophils Relative %: 79 %
Platelets: 449 10*3/uL — ABNORMAL HIGH (ref 150–400)
RBC: 5.26 MIL/uL (ref 4.22–5.81)
RDW: 14.6 % (ref 11.5–15.5)
WBC: 16.9 10*3/uL — ABNORMAL HIGH (ref 4.0–10.5)
nRBC: 0 % (ref 0.0–0.2)

## 2022-08-18 LAB — MAGNESIUM: Magnesium: 1.8 mg/dL (ref 1.7–2.4)

## 2022-08-18 LAB — BETA-HYDROXYBUTYRIC ACID
Beta-Hydroxybutyric Acid: 5.84 mmol/L — ABNORMAL HIGH (ref 0.05–0.27)
Beta-Hydroxybutyric Acid: 0.28 mmol/L — ABNORMAL HIGH (ref 0.05–0.27)

## 2022-08-18 MED ORDER — LACTATED RINGERS IV SOLN
INTRAVENOUS | Status: DC
Start: 2022-08-18 — End: 2022-08-19

## 2022-08-18 MED ORDER — POTASSIUM CHLORIDE CRYS ER 20 MEQ PO TBCR
20.0000 meq | EXTENDED_RELEASE_TABLET | Freq: Once | ORAL | Status: AC
  Administered 2022-08-18: 20 meq via ORAL
  Filled 2022-08-18: qty 1

## 2022-08-18 MED ORDER — DEXTROSE IN LACTATED RINGERS 5 % IV SOLN: INTRAVENOUS | Status: DC

## 2022-08-18 MED ORDER — LACTATED RINGERS IV SOLN: INTRAVENOUS | Status: DC

## 2022-08-18 MED ORDER — MAGNESIUM SULFATE 2 GM/50ML IV SOLN
2.0000 g | Freq: Once | INTRAVENOUS | Status: AC
  Administered 2022-08-18: 2 g via INTRAVENOUS
  Filled 2022-08-18: qty 50

## 2022-08-18 MED ORDER — DEXTROSE 50 % IV SOLN
0.0000 mL | INTRAVENOUS | Status: DC | PRN
  Filled 2022-08-18: qty 50

## 2022-08-18 MED ORDER — SODIUM CHLORIDE 0.9 % IV SOLN: INTRAVENOUS | Status: DC

## 2022-08-18 MED ORDER — LACTATED RINGERS IV BOLUS: 20.0000 mL/kg | Freq: Once | INTRAVENOUS | Status: DC

## 2022-08-18 MED ORDER — INSULIN REGULAR(HUMAN) IN NACL 100-0.9 UT/100ML-% IV SOLN
INTRAVENOUS | Status: DC
Start: 2022-08-18 — End: 2022-08-19
  Administered 2022-08-18: 13 [IU]/h via INTRAVENOUS
  Filled 2022-08-18 (×2): qty 100

## 2022-08-18 MED ORDER — INSULIN DETEMIR 100 UNIT/ML ~~LOC~~ SOLN
10.0000 [IU] | Freq: Every day | SUBCUTANEOUS | Status: DC
  Administered 2022-08-18: 10 [IU] via SUBCUTANEOUS
  Filled 2022-08-18: qty 0.1

## 2022-08-18 MED ORDER — LACTATED RINGERS IV BOLUS
1000.0000 mL | Freq: Once | INTRAVENOUS | Status: AC
Start: 2022-08-18 — End: 2022-08-18
  Administered 2022-08-18: 1000 mL via INTRAVENOUS

## 2022-08-18 MED ORDER — INSULIN DETEMIR 100 UNIT/ML ~~LOC~~ SOLN
15.0000 [IU] | Freq: Every day | SUBCUTANEOUS | Status: DC
Start: 2022-08-18 — End: 2022-08-18
  Filled 2022-08-18: qty 0.15

## 2022-08-18 MED ORDER — INSULIN ASPART 100 UNIT/ML IJ SOLN
15.0000 [IU] | Freq: Once | INTRAMUSCULAR | Status: DC
Start: 2022-08-18 — End: 2022-08-18

## 2022-08-18 MED ORDER — ENOXAPARIN SODIUM 30 MG/0.3ML IJ SOSY
30.0000 mg | PREFILLED_SYRINGE | INTRAMUSCULAR | Status: DC
  Administered 2022-08-18 – 2022-08-19 (×2): 30 mg via SUBCUTANEOUS
  Filled 2022-08-18 (×2): qty 0.3

## 2022-08-18 NOTE — Progress Notes (Signed)
Blood sugar 487, MD made aware. MD ordered Levemir and 15 units Humalog.  1026 blood sugar 466, MD aware. MD told this RN no further intervention and to recheck at 1200.

## 2022-08-18 NOTE — Progress Notes (Signed)
MD called this RN to ask about status of STAT BMP order. RN called phlebotomy with no response. After multiple attempts, phlebotomy told this RN that they would be down shortly to get the STAT lab.

## 2022-08-18 NOTE — Progress Notes (Signed)
Progress Note  Patient Name: Andre Stout Date of Encounter: 08/18/2022  Primary Cardiologist: Evalina Field, MD   Subjective   C/o nausea.   Inpatient Medications    Scheduled Meds:  aspirin  81 mg Oral QHS   brimonidine  1 drop Both Eyes BID WC   dorzolamide-timolol  1 drop Both Eyes BID WC   enoxaparin (LOVENOX) injection  40 mg Subcutaneous Q24H   hydrALAZINE  10 mg Intravenous Q8H   insulin aspart  0-15 Units Subcutaneous TID WC   insulin aspart  15 Units Subcutaneous Once   insulin detemir  10 Units Subcutaneous Daily   latanoprost  1 drop Both Eyes QHS   sodium chloride flush  5 mL Intracatheter Q8H   Continuous Infusions:  sodium chloride 100 mL/hr at 08/18/22 0909   magnesium sulfate bolus IVPB 2 g (08/18/22 0953)   piperacillin-tazobactam (ZOSYN)  IV 3.375 g (08/18/22 0351)   PRN Meds: acetaminophen, hydrALAZINE, HYDROmorphone (DILAUDID) injection, ondansetron (ZOFRAN) IV   Vital Signs    Vitals:   08/17/22 2011 08/18/22 0000 08/18/22 0518 08/18/22 0801  BP: 133/63 123/65 (!) 171/70 132/60  Pulse: 66  68 76  Resp: '20 13 15 20  '$ Temp: 98.1 F (36.7 C) 98 F (36.7 C) 98.2 F (36.8 C) 98.4 F (36.9 C)  TempSrc: Oral Oral Oral Oral  SpO2: 95% 96% 99% 98%  Weight:      Height:        Intake/Output Summary (Last 24 hours) at 08/18/2022 1001 Last data filed at 08/18/2022 0518 Gross per 24 hour  Intake 609.87 ml  Output 425 ml  Net 184.87 ml   Filed Weights   08/14/22 1654 08/16/22 1952 08/17/22 0005  Weight: 83.1 kg 90.4 kg 89.2 kg    Telemetry    Nsr with CHB, ventricular rate 60-70.  - Personally Reviewed  ECG    reviewed - Personally Reviewed  Physical Exam   GEN: No acute distress.   Neck: No JVD Cardiac: RRR, no murmurs, rubs, or gallops.  Respiratory: Clear to auscultation bilaterally. GI: Soft, nontender, non-distended , drain in the abdomen. MS: No edema; No deformity. Neuro:  Nonfocal  Psych: Normal affect   Labs     Chemistry Recent Labs  Lab 08/15/22 0553 08/16/22 0524 08/18/22 0305  NA 140 138 136  K 3.5 3.8 4.4  CL 106 106 103  CO2 27 23 12*  GLUCOSE 84 133* 329*  BUN '15 12 12  '$ CREATININE 1.09 1.15 1.57*  CALCIUM 8.5* 8.5* 8.9  PROT 6.4* 6.5 6.4*  ALBUMIN 2.6* 2.7* 2.5*  AST 10* 11* 10*  ALT '11 9 10  '$ ALKPHOS 66 71 82  BILITOT 1.6* 1.3* 2.3*  GFRNONAA >60 >60 44*  ANIONGAP 7 9 21*     Hematology Recent Labs  Lab 08/15/22 0553 08/16/22 0524 08/18/22 0533  WBC 12.8* 10.8* 16.9*  RBC 4.69 4.55 5.26  HGB 12.9* 12.6* 14.3  HCT 39.6 39.0 43.9  MCV 84.4 85.7 83.5  MCH 27.5 27.7 27.2  MCHC 32.6 32.3 32.6  RDW 14.4 14.3 14.6  PLT 302 327 449*    Cardiac EnzymesNo results for input(s): "TROPONINI" in the last 168 hours. No results for input(s): "TROPIPOC" in the last 168 hours.   BNPNo results for input(s): "BNP", "PROBNP" in the last 168 hours.   DDimer No results for input(s): "DDIMER" in the last 168 hours.   Radiology    No results found.  Cardiac Studies  none  Patient Profile     82 y.o. male admitted with cholecystitis and s/p perc drain.   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 No plans for PPM at this time.  Preop - he could undergo open surgical procedure if necessary. Hopefully his percutaneous drain will suffice. Cholecystitis - his WBC is up and he is still nauseated. Hopefully this will improve.  [facility] will sign off.   Medication Recommendations:  avoid AV nodal blocking drugs Other recommendations (labs, testing, etc):  none Follow up as an outpatient:  as scheduled.  For questions or updates, please contact [place] Please consult [email or web address] for contact info under Cardiology/STEMI.      Signed, Cristopher Peru, MD  08/18/2022, 10:01 AM

## 2022-08-18 NOTE — Progress Notes (Addendum)
       Subjective: CC: S/p perc chole. No abdominal pain. Nauseated. No vomiting since last night. Taking in cld okay.   Objective: Vital signs in last 24 hours: Temp:  [98 F (36.7 C)-98.5 F (36.9 C)] 98.4 F (36.9 C) (08/19 0801) Pulse Rate:  [58-89] 76 (08/19 0801) Resp:  [13-23] 20 (08/19 0801) BP: (108-209)/(50-88) 132/60 (08/19 0801) SpO2:  [94 %-99 %] 98 % (08/19 0801) Last BM Date : 08/17/22  Intake/Output from previous day: 08/18 0701 - 08/19 0700 In: 609.9 [P.O.:120; I.V.:484.9] Out: 725 [Urine:650; Drains:75] Intake/Output this shift: No intake/output data recorded.  PE: Gen:  Alert, NAD, pleasant Abd: Soft, ND, NT, +BS, IR drain with bilious fluid in bag  Lab Results:  Recent Labs    08/16/22 0524 08/18/22 0533  WBC 10.8* 16.9*  HGB 12.6* 14.3  HCT 39.0 43.9  PLT 327 449*   BMET Recent Labs    08/16/22 0524 08/18/22 0305  NA 138 136  K 3.8 4.4  CL 106 103  CO2 23 12*  GLUCOSE 133* 329*  BUN 12 12  CREATININE 1.15 1.57*  CALCIUM 8.5* 8.9   PT/INR No results for input(s): "LABPROT", "INR" in the last 72 hours. CMP     Component Value Date/Time   NA 136 08/18/2022 0305   K 4.4 08/18/2022 0305   CL 103 08/18/2022 0305   CO2 12 (L) 08/18/2022 0305   GLUCOSE 329 (H) 08/18/2022 0305   BUN 12 08/18/2022 0305   CREATININE 1.57 (H) 08/18/2022 0305   CREATININE 1.11 01/12/2022 1541   CALCIUM 8.9 08/18/2022 0305   PROT 6.4 (L) 08/18/2022 0305   ALBUMIN 2.5 (L) 08/18/2022 0305   AST 10 (L) 08/18/2022 0305   ALT 10 08/18/2022 0305   ALKPHOS 82 08/18/2022 0305   BILITOT 2.3 (H) 08/18/2022 0305   GFRNONAA 44 (L) 08/18/2022 0305   GFRAA >60 03/07/2016 0337   Lipase     Component Value Date/Time   LIPASE 22 03/04/2016 1149    Studies/Results: No results found.  Anti-infectives: Anti-infectives (From admission, onward)    Start     Dose/Rate Route Frequency Ordered Stop   08/14/22 2000  piperacillin-tazobactam (ZOSYN) IVPB 3.375 g         3.375 g 12.5 mL/hr over 240 Minutes Intravenous Every 8 hours 08/14/22 1516     08/14/22 1145  piperacillin-tazobactam (ZOSYN) IVPB 3.375 g        3.375 g 12.5 mL/hr over 240 Minutes Intravenous  Once 08/14/22 1138 08/14/22 1632        Assessment/Plan Cholecystitis - S/p Perc chole 8/18. Cx's pending. Cont abx. Repeat labs in am - It appears nausea is his main symptom. Never significant pain. This is slightly improved this morning. ADAT -we will temporize/treat his cholecystitis with a perc drain for now, continue cards follow up/work up for complete heart block and then we will see him in 6-8 weeks and see where things stand and if he is safe at that point to pursue interval cholecystectomy. - Will see again in the am to ensure labs and symptoms improving.    FEN - CLD, ADAT VTE - lovenox ID - zosyn   Complete heart block HTN DM H/o prostate cancer   LOS: 4 days    Jillyn Ledger , [facility] 08/18/2022, 10:05 AM Please see Amion for pager number during day hours 7:00am-4:30pm

## 2022-08-18 NOTE — Progress Notes (Signed)
PROGRESS NOTE    Andre Stout  LOV:[id number] DOB: 1940/12/14 DOA: 08/14/2022 PCP: [name], FNP   Brief Narrative: 82 year old with past medical history significant for hypertension, prostate cancer, insulin-dependent diabetes, high-grade AV block who presents to [facility] 80/15 with abdominal pain, found to have cholelithiasis and acute cholecystitis.  Cardiology consulted due to history of high-grade AV block, general surgery following. Surgery recommended cholecystostomy tube placement.  Patient underwent cholecystostomy tube placement 8/18 by IR.    Assessment & Plan:   Principal Problem:   Cholecystitis Active Problems:   Heart block AV complete (HCC)   Acute cholecystitis   1-Acute Cholecystitis: Given history of complete heart block, general surgery deferring laparoscopic cholecystectomy for now. -S/P   cholecystostomy tube placement 80/18, follow urine culture results -Continue with IV fluids -Continue with IV Zosyn -On clear liquid diet -Appreciate IR and surgery  2-Complete heart block Recently referred to electrophysiology outpatient On arrival EKG consistent with complete heart block, although asymptomatic. Cardiology consulted.  Per cardiology this has been chronic and has junctional escape in the 60s.  No plan for PPM at this time. Cardiology patient to undergo open surgical procedure if necessary.  Hopefully percutaneous drain will suffice  3-Diabetes type 2 uncontrolled hyperglycemia, DKA;  A1c 8.3 -CBG elevated, bicarb 12, anion gap 21. -Received short acting NovoLog without significant improvement of CBG -Plan to start insulin drip, check be met every 4 hours, transition to long-acting insulin when gap close.  Mild elevation of troponin Cardiology  does not think that this is consistent with ACS.  Essential hypertension: IV hydralazine.   Obesity: Need lifestyle modification   Estimated body mass index is 32.72 kg/m as calculated  from the following:   Height as of this encounter: 5' 5"  (1.651 m).   Weight as of this encounter: 89.2 kg.   DVT prophylaxis: Lovenox Code Status: Full code Family Communication: Family at bedside.  Disposition Plan:  Status is: Inpatient Remains inpatient appropriate because: management of cholecystitis, DKA    Consultants:  Cardiology Surgery IR  Procedures:  Cholecystostomy tube placement.   Antimicrobials:  IV zosyn.   Subjective: He vomited once this am, per nurse less frequent vomiting than yesterday.  He denies worsening pain.   Objective: Vitals:   08/17/22 1715 08/17/22 2011 08/18/22 0000 08/18/22 0518  BP: (!) 176/66 133/63 123/65 (!) 171/70  Pulse: 65 66  68  Resp: (!) 23 20 13 15   Temp:  98.1 F (36.7 C) 98 F (36.7 C) 98.2 F (36.8 C)  TempSrc:  Oral Oral Oral  SpO2: 96% 95% 96% 99%  Weight:      Height:        Intake/Output Summary (Last 24 hours) at 08/18/2022 0713 Last data filed at 08/18/2022 0518 Gross per 24 hour  Intake 609.87 ml  Output 725 ml  Net -115.13 ml   Filed Weights   08/14/22 1654 08/16/22 1952 08/17/22 0005  Weight: 83.1 kg 90.4 kg 89.2 kg    Examination:  General exam: Appears calm and comfortable  Respiratory system: Clear to auscultation. Respiratory effort normal. Cardiovascular system: S1 & S2 heard, RRR. No JVD, murmurs, rubs, gallops or clicks. No pedal edema. Gastrointestinal system: Abdomen is nondistended, soft and nontender. No organomegaly or masses felt. Normal bowel sounds heard. Central nervous system: Alert and oriented. No focal neurological deficits. Extremities: Symmetric 5 x 5 power.    Data Reviewed: I have personally reviewed following labs and imaging studies  CBC: Recent Labs  Lab 08/14/22 0122 08/15/22 0553 08/16/22 0524 08/18/22 0533  WBC 19.9* 12.8* 10.8* 16.9*  NEUTROABS 16.1*  --   --  13.5*  HGB 15.0 12.9* 12.6* 14.3  HCT 45.8 39.6 39.0 43.9  MCV 83.7 84.4 85.7 83.5  PLT 334  302 327 945*   Basic Metabolic Panel: Recent Labs  Lab 08/14/22 0122 08/14/22 1749 08/15/22 0553 08/16/22 0524 08/18/22 0305  NA 137  --  140 138 136  K 3.8  --  3.5 3.8 4.4  CL 102  --  106 106 103  CO2 25  --  27 23 12*  GLUCOSE 116*  --  84 133* 329*  BUN 11  --  15 12 12   CREATININE 1.07  --  1.09 1.15 1.57*  CALCIUM 9.3  --  8.5* 8.5* 8.9  MG  --  1.8  --  1.9 1.8  PHOS  --  3.0  --   --   --    GFR: Estimated Creatinine Clearance: 37.9 mL/min (A) (by C-G formula based on SCr of 1.57 mg/dL (H)). Liver Function Tests: Recent Labs  Lab 08/14/22 0122 08/15/22 0553 08/16/22 0524 08/18/22 0305  AST 16 10* 11* 10*  ALT 11 11 9 10   ALKPHOS 87 66 71 82  BILITOT 1.6* 1.6* 1.3* 2.3*  PROT 7.4 6.4* 6.5 6.4*  ALBUMIN 3.1* 2.6* 2.7* 2.5*   No results for input(s): "LIPASE", "AMYLASE" in the last 168 hours. No results for input(s): "AMMONIA" in the last 168 hours. Coagulation Profile: Recent Labs  Lab 08/14/22 1200  INR 1.3*   Cardiac Enzymes: No results for input(s): "CKTOTAL", "CKMB", "CKMBINDEX", "TROPONINI" in the last 168 hours. BNP (last 3 results) No results for input(s): "PROBNP" in the last 8760 hours. HbA1C: No results for input(s): "HGBA1C" in the last 72 hours. CBG: Recent Labs  Lab 08/17/22 1108 08/17/22 1713 08/17/22 2131 08/18/22 0040 08/18/22 0512  GLUCAP 233* 259* 209* 289* 344*   Lipid Profile: No results for input(s): "CHOL", "HDL", "LDLCALC", "TRIG", "CHOLHDL", "LDLDIRECT" in the last 72 hours. Thyroid Function Tests: No results for input(s): "TSH", "T4TOTAL", "FREET4", "T3FREE", "THYROIDAB" in the last 72 hours. Anemia Panel: No results for input(s): "VITAMINB12", "FOLATE", "FERRITIN", "TIBC", "IRON", "RETICCTPCT" in the last 72 hours. Sepsis Labs: No results for input(s): "PROCALCITON", "LATICACIDVEN" in the last 168 hours.  Recent Results (from the past 240 hour(s))  Aerobic/Anaerobic Culture w Gram Stain (surgical/deep wound)      Status: None (Preliminary result)   Collection Time: 08/17/22  5:23 PM   Specimen: PATH GI biopsy; Bile  Result Value Ref Range Status   Specimen Description BILE  Final   Special Requests NONE  Final   Gram Stain   Final    NO WBC SEEN NO ORGANISMS SEEN Performed at [facility], [address] [place], [place] [address]    Culture PENDING  Incomplete   Report Status PENDING  Incomplete         Radiology Studies: ECHOCARDIOGRAM COMPLETE  Result Date: 08/16/2022    ECHOCARDIOGRAM REPORT   Patient Name:   DONSHAY LUPINSKI Date of Exam: 08/16/2022 Medical Rec #:  [id number]      Height:       64.0 in Accession #:    [id number]     Weight:       183.2 lb Date of Birth:  05-Nov-1940     BSA:          1.885 m Patient Age:  81 years       BP:           200/91 mmHg Patient Gender: M              HR:           65 bpm. Exam Location:  Inpatient Procedure: 2D Echo, Color Doppler, Cardiac Doppler and Intracardiac            Opacification Agent Indications:    Heart Block  History:        Patient has prior history of Echocardiogram examinations, most                 recent 01/03/2022. Risk Factors:Hypertension and Diabetes.  Sonographer:    Raquel Sarna Senior RDCS Referring Phys: [id number] Donato Heinz  Sonographer Comments: Suboptimal apical window. IMPRESSIONS  1. Left ventricular ejection fraction, by estimation, is 60 to 65%. The left ventricle has normal function. The left ventricle has no regional wall motion abnormalities. There is mild left ventricular hypertrophy. Left ventricular diastolic parameters are consistent with Grade I diastolic dysfunction (impaired relaxation).  2. Right ventricular systolic function is normal. The right ventricular size is normal. Tricuspid regurgitation signal is inadequate for assessing PA pressure.  3. Left atrial size was moderately dilated.  4. The mitral valve is abnormal. Trivial mitral valve regurgitation.  5. The aortic valve was not well visualized.  Aortic valve regurgitation is not visualized.  6. The inferior vena cava is normal in size with greater than 50% respiratory variability, suggesting right atrial pressure of 3 mmHg.  7. Cannot exclude a small PFO. Comparison(s): Prior images unable to be directly viewed, comparison made by report only. Changes from prior study are noted. 01/03/2022: LVEF 55-60%. FINDINGS  Left Ventricle: Left ventricular ejection fraction, by estimation, is 60 to 65%. The left ventricle has normal function. The left ventricle has no regional wall motion abnormalities. Definity contrast agent was given IV to delineate the left ventricular  endocardial borders. The left ventricular internal cavity size was normal in size. There is mild left ventricular hypertrophy. Left ventricular diastolic parameters are consistent with Grade I diastolic dysfunction (impaired relaxation). Indeterminate filling pressures. Right Ventricle: The right ventricular size is normal. No increase in right ventricular wall thickness. Right ventricular systolic function is normal. Tricuspid regurgitation signal is inadequate for assessing PA pressure. Left Atrium: Left atrial size was moderately dilated. Right Atrium: Right atrial size was normal in size. Pericardium: There is no evidence of pericardial effusion. Mitral Valve: The mitral valve is abnormal. There is mild thickening of the mitral valve leaflet(s). Trivial mitral valve regurgitation. Tricuspid Valve: The tricuspid valve is not well visualized. Tricuspid valve regurgitation is not demonstrated. Aortic Valve: The aortic valve was not well visualized. Aortic valve regurgitation is not visualized. Pulmonic Valve: The pulmonic valve was not well visualized. Pulmonic valve regurgitation is not visualized. Aorta: The aortic root and ascending aorta are structurally normal, with no evidence of dilitation. Venous: The inferior vena cava is normal in size with greater than 50% respiratory variability,  suggesting right atrial pressure of 3 mmHg. IAS/Shunts: The interatrial septum is aneurysmal. Cannot exclude a small PFO.  LEFT VENTRICLE PLAX 2D LVIDd:         4.00 cm   Diastology LVIDs:         2.50 cm   LV e' medial:    4.79 cm/s LV PW:         1.00 cm   LV E/e' medial:  17.5 LV IVS:        1.10 cm   LV e' lateral:   7.51 cm/s LVOT diam:     2.10 cm   LV E/e' lateral: 11.2 LV SV:         67 LV SV Index:   35 LVOT Area:     3.46 cm  RIGHT VENTRICLE RV S prime:     11.70 cm/s TAPSE (M-mode): 1.8 cm LEFT ATRIUM             Index        RIGHT ATRIUM           Index LA diam:        3.70 cm 1.96 cm/m   RA Area:     19.30 cm LA Vol (A2C):   64.9 ml 34.43 ml/m  RA Volume:   57.10 ml  30.30 ml/m LA Vol (A4C):   95.8 ml 50.83 ml/m LA Biplane Vol: 78.8 ml 41.81 ml/m  AORTIC VALVE LVOT Vmax:   88.80 cm/s LVOT Vmean:  59.100 cm/s LVOT VTI:    0.193 m  AORTA Ao Root diam: 3.20 cm Ao Asc diam:  3.00 cm MITRAL VALVE MV Area (PHT): 3.85 cm    SHUNTS MV Decel Time: 197 msec    Systemic VTI:  0.19 m MV E velocity: 84.00 cm/s  Systemic Diam: 2.10 cm MV A velocity: 94.70 cm/s MV E/A ratio:  0.89 Lyman Bishop MD Electronically signed by Lyman Bishop MD Signature Date/Time: 08/16/2022/10:56:43 AM    Final         Scheduled Meds:  aspirin  81 mg Oral QHS   brimonidine  1 drop Both Eyes BID WC   dorzolamide-timolol  1 drop Both Eyes BID WC   enoxaparin (LOVENOX) injection  40 mg Subcutaneous Q24H   hydrALAZINE  10 mg Intravenous Q8H   insulin aspart  0-15 Units Subcutaneous TID WC   latanoprost  1 drop Both Eyes QHS   sodium chloride flush  5 mL Intracatheter Q8H   Continuous Infusions:  piperacillin-tazobactam (ZOSYN)  IV 3.375 g (08/18/22 0351)     LOS: 4 days    Time spent: 35 minutes    Ismael Treptow A Leary Mcnulty, MD [facility]   If 7PM-7AM, please contact night-coverage [email or web address]  08/18/2022, 7:13 AM

## 2022-08-18 NOTE — Inpatient Diabetes Management (Signed)
Inpatient Diabetes Program Recommendations  AACE/ADA: New Consensus Statement on Inpatient Glycemic Control (2015)  Target Ranges:  Prepandial:   less than 140 mg/dL      Peak postprandial:   less than 180 mg/dL (1-2 hours)      Critically ill patients:  140 - 180 mg/dL   Lab Results  Component Value Date   GLUCAP 426 (H) 08/18/2022   HGBA1C 8.3 (H) 08/02/2022    Review of Glycemic Control  Latest Reference Range & Units 08/18/22 03:05  Sodium 135 - 145 mmol/L 136  Potassium 3.5 - 5.1 mmol/L 4.4  Chloride 98 - 111 mmol/L 103  CO2 22 - 32 mmol/L 12 (L)  Glucose 70 - 99 mg/dL 329 (H)  BUN 8 - 23 mg/dL 12  Creatinine 0.61 - 1.24 mg/dL 1.57 (H)  Calcium 8.9 - 10.3 mg/dL 8.9  Anion gap 5 - 15  21 (H)   Diabetes history: DM 2 Outpatient Diabetes medications: 70/30 36 units bid Current orders for Inpatient glycemic control:  IV insulin/ Endotool/DKA  Inpatient Diabetes Program Recommendations:    Glucose increased this am in the 400 range and labs indicating DKA.  At time of transition, based on home doses of insulin consider: -  Semglee 40 units -  Novolog 0-15 units tid + hs -  Novolog 4 units tid meal coverage if eating >50% of meals.  Thanks,  Tama Headings RN, MSN, BC-ADM Inpatient Diabetes Coordinator Team Pager [phone] (8a-5p)

## 2022-08-18 NOTE — Plan of Care (Signed)
  Problem: Education: Goal: Ability to describe self-care measures that may prevent or decrease complications (Diabetes Survival Skills Education) will improve Outcome: Progressing Goal: Individualized Educational Video(s) Outcome: Progressing   Problem: Coping: Goal: Ability to adjust to condition or change in health will improve Outcome: Progressing   Problem: Fluid Volume: Goal: Ability to maintain a balanced intake and output will improve Outcome: Progressing   Problem: Health Behavior/Discharge Planning: Goal: Ability to identify and utilize available resources and services will improve Outcome: Progressing Goal: Ability to manage health-related needs will improve Outcome: Progressing   Problem: Metabolic: Goal: Ability to maintain appropriate glucose levels will improve Outcome: Progressing   Problem: Nutritional: Goal: Maintenance of adequate nutrition will improve Outcome: Progressing Goal: Progress toward achieving an optimal weight will improve Outcome: Progressing   Problem: Skin Integrity: Goal: Risk for impaired skin integrity will decrease Outcome: Progressing   Problem: Tissue Perfusion: Goal: Adequacy of tissue perfusion will improve Outcome: Progressing   Problem: Education: Goal: Knowledge of General Education information will improve Description: Including pain rating scale, medication(s)/side effects and non-pharmacologic comfort measures Outcome: Progressing   Problem: Health Behavior/Discharge Planning: Goal: Ability to manage health-related needs will improve Outcome: Progressing   Problem: Clinical Measurements: Goal: Ability to maintain clinical measurements within normal limits will improve Outcome: Progressing Goal: Will remain free from infection Outcome: Progressing Goal: Diagnostic test results will improve Outcome: Progressing Goal: Respiratory complications will improve Outcome: Progressing Goal: Cardiovascular complication will  be avoided Outcome: Progressing   Problem: Activity: Goal: Risk for activity intolerance will decrease Outcome: Progressing   Problem: Nutrition: Goal: Adequate nutrition will be maintained Outcome: Progressing   Problem: Coping: Goal: Level of anxiety will decrease Outcome: Progressing   Problem: Elimination: Goal: Will not experience complications related to bowel motility Outcome: Progressing   Problem: Pain Managment: Goal: General experience of comfort will improve Outcome: Progressing   Problem: Safety: Goal: Ability to remain free from injury will improve Outcome: Progressing   Problem: Skin Integrity: Goal: Risk for impaired skin integrity will decrease Outcome: Progressing

## 2022-08-18 NOTE — Progress Notes (Signed)
Referring Physician(s): Dr Mamie Laurel  Supervising Physician: Corrie Mckusick  Patient Status:  [facility] - In-pt  Chief Complaint:  Cholecystitis/cholelithiasis  Subjective:  Perc chole drain placed in IR 8/18 Up in bed Feeling better Still sore Eating some  Allergies: Patient has no known allergies.  Medications: Prior to Admission medications   Medication Sig Start Date End Date Taking? Authorizing Provider  acetaminophen (TYLENOL) 500 MG tablet Take 1,000 mg by mouth daily as needed (pain).   Yes [provider]  aspirin 81 MG chewable tablet Chew 1 tablet (81 mg total) by mouth daily. Patient taking differently: Chew 81 mg by mouth at bedtime. 01/06/22  Yes Swayze, Ava, DO  brimonidine (ALPHAGAN) 0.2 % ophthalmic solution Place 1 drop into both eyes 2 (two) times daily with breakfast and lunch. 12/18/21  Yes [provider]  cetirizine (ZYRTEC) 10 MG tablet Take 10 mg by mouth daily as needed (seasonal allergies).   Yes [provider]  insulin NPH-regular Human (70-30) 100 UNIT/ML injection Inject 40 Units into the skin 2 (two) times daily with a meal. Patient taking differently: Inject 36 Units into the skin 2 (two) times daily with a meal. 08/04/22  Yes Pahwani, Ravi, MD  latanoprost (XALATAN) 0.005 % ophthalmic solution Place 1 drop into both eyes at bedtime. 01/01/22  Yes [provider]  magnesium oxide (MAG-OX) 400 (240 Mg) MG tablet Take 400 mg by mouth at bedtime. 06/06/22  Yes [provider]  Multiple Vitamins-Minerals (PRESERVISION AREDS 2) CAPS Take 1 capsule by mouth at bedtime.   Yes [provider]  amLODipine (NORVASC) 10 MG tablet Take 1 tablet (10 mg total) by mouth daily. Patient not taking: Reported on 08/14/2022 08/05/22 09/04/22  Darliss Cheney, MD  BD INSULIN SYRINGE U/F 31G X 5/16" 1 ML MISC USE 2 DAILY 08/24/21   Renato Shin, MD  dorzolamide-timolol (COSOPT) 22.3-6.8 MG/ML ophthalmic solution Place 1 drop into  both eyes 2 (two) times daily with breakfast and lunch. 05/18/22   [provider]  lisinopril (ZESTRIL) 20 MG tablet Take 1 tablet (20 mg total) by mouth daily. Patient not taking: Reported on 08/14/2022 08/05/22 09/04/22  Darliss Cheney, MD  [facility] DELICA LANCETS 81E MISC Use to check blood sugar 4 times per day. Dx code: E11.9 03/27/16   Renato Shin, MD  [facility] ULTRA test strip USE TO MONITOR GLUCOSE LEVELS 4 TIMES PER DAY E11.9 06/10/19   Renato Shin, MD     Vital Signs: BP 132/60 (BP Location: Left Arm)   Pulse 76   Temp 98.4 F (36.9 C) (Oral)   Resp 20   Ht '5\' 5"'$  (1.651 m)   Wt 196 lb 10.4 oz (89.2 kg)   SpO2 98%   BMI 32.72 kg/m   Physical Exam Vitals reviewed.  Skin:    General: Skin is warm.     Comments: Site is clean and dry Mildly tender C/d/I Flushes easily OP bloody  Neurological:     Mental Status: He is alert.     Imaging: ECHOCARDIOGRAM COMPLETE  Result Date: 08/16/2022    ECHOCARDIOGRAM REPORT   Patient Name:   Andre Stout Date of Exam: 08/16/2022 Medical Rec #:  [id number]      Height:       64.0 in Accession #:    [id number]     Weight:       183.2 lb Date of Birth:  [birth year]     BSA:  1.885 m Patient Age:    82 years       BP:           200/91 mmHg Patient Gender: M              HR:           65 bpm. Exam Location:  Inpatient Procedure: 2D Echo, Color Doppler, Cardiac Doppler and Intracardiac            Opacification Agent Indications:    Heart Block  History:        Patient has prior history of Echocardiogram examinations, most                 recent 01/03/2022. Risk Factors:Hypertension and Diabetes.  Sonographer:    Raquel Sarna Senior RDCS Referring Phys: [id number] Donato Heinz  Sonographer Comments: Suboptimal apical window. IMPRESSIONS  1. Left ventricular ejection fraction, by estimation, is 60 to 65%. The left ventricle has normal function. The left ventricle has no regional wall motion abnormalities. There is mild left ventricular  hypertrophy. Left ventricular diastolic parameters are consistent with Grade I diastolic dysfunction (impaired relaxation).  2. Right ventricular systolic function is normal. The right ventricular size is normal. Tricuspid regurgitation signal is inadequate for assessing PA pressure.  3. Left atrial size was moderately dilated.  4. The mitral valve is abnormal. Trivial mitral valve regurgitation.  5. The aortic valve was not well visualized. Aortic valve regurgitation is not visualized.  6. The inferior vena cava is normal in size with greater than 50% respiratory variability, suggesting right atrial pressure of 3 mmHg.  7. Cannot exclude a small PFO. Comparison(s): Prior images unable to be directly viewed, comparison made by report only. Changes from prior study are noted. 01/03/2022: LVEF 55-60%. FINDINGS  Left Ventricle: Left ventricular ejection fraction, by estimation, is 60 to 65%. The left ventricle has normal function. The left ventricle has no regional wall motion abnormalities. Definity contrast agent was given IV to delineate the left ventricular  endocardial borders. The left ventricular internal cavity size was normal in size. There is mild left ventricular hypertrophy. Left ventricular diastolic parameters are consistent with Grade I diastolic dysfunction (impaired relaxation). Indeterminate filling pressures. Right Ventricle: The right ventricular size is normal. No increase in right ventricular wall thickness. Right ventricular systolic function is normal. Tricuspid regurgitation signal is inadequate for assessing PA pressure. Left Atrium: Left atrial size was moderately dilated. Right Atrium: Right atrial size was normal in size. Pericardium: There is no evidence of pericardial effusion. Mitral Valve: The mitral valve is abnormal. There is mild thickening of the mitral valve leaflet(s). Trivial mitral valve regurgitation. Tricuspid Valve: The tricuspid valve is not well visualized. Tricuspid valve  regurgitation is not demonstrated. Aortic Valve: The aortic valve was not well visualized. Aortic valve regurgitation is not visualized. Pulmonic Valve: The pulmonic valve was not well visualized. Pulmonic valve regurgitation is not visualized. Aorta: The aortic root and ascending aorta are structurally normal, with no evidence of dilitation. Venous: The inferior vena cava is normal in size with greater than 50% respiratory variability, suggesting right atrial pressure of 3 mmHg. IAS/Shunts: The interatrial septum is aneurysmal. Cannot exclude a small PFO.  LEFT VENTRICLE PLAX 2D LVIDd:         4.00 cm   Diastology LVIDs:         2.50 cm   LV e' medial:    4.79 cm/s LV PW:         1.00  cm   LV E/e' medial:  17.5 LV IVS:        1.10 cm   LV e' lateral:   7.51 cm/s LVOT diam:     2.10 cm   LV E/e' lateral: 11.2 LV SV:         67 LV SV Index:   35 LVOT Area:     3.46 cm  RIGHT VENTRICLE RV S prime:     11.70 cm/s TAPSE (M-mode): 1.8 cm LEFT ATRIUM             Index        RIGHT ATRIUM           Index LA diam:        3.70 cm 1.96 cm/m   RA Area:     19.30 cm LA Vol (A2C):   64.9 ml 34.43 ml/m  RA Volume:   57.10 ml  30.30 ml/m LA Vol (A4C):   95.8 ml 50.83 ml/m LA Biplane Vol: 78.8 ml 41.81 ml/m  AORTIC VALVE LVOT Vmax:   88.80 cm/s LVOT Vmean:  59.100 cm/s LVOT VTI:    0.193 m  AORTA Ao Root diam: 3.20 cm Ao Asc diam:  3.00 cm MITRAL VALVE MV Area (PHT): 3.85 cm    SHUNTS MV Decel Time: 197 msec    Systemic VTI:  0.19 m MV E velocity: 84.00 cm/s  Systemic Diam: 2.10 cm MV A velocity: 94.70 cm/s MV E/A ratio:  0.89 Lyman Bishop MD Electronically signed by Lyman Bishop MD Signature Date/Time: 08/16/2022/10:56:43 AM    Final     Labs:  CBC: Recent Labs    08/14/22 0122 08/15/22 0553 08/16/22 0524 08/18/22 0533  WBC 19.9* 12.8* 10.8* 16.9*  HGB 15.0 12.9* 12.6* 14.3  HCT 45.8 39.6 39.0 43.9  PLT 334 302 327 449*    COAGS: Recent Labs    08/14/22 1200  INR 1.3*  APTT 31    BMP: Recent Labs     08/14/22 0122 08/15/22 0553 08/16/22 0524 08/18/22 0305  NA 137 140 138 136  K 3.8 3.5 3.8 4.4  CL 102 106 106 103  CO2 '25 27 23 '$ 12*  GLUCOSE 116* 84 133* 329*  BUN '11 15 12 12  '$ CALCIUM 9.3 8.5* 8.5* 8.9  CREATININE 1.07 1.09 1.15 1.57*  GFRNONAA >60 >60 >60 44*    LIVER FUNCTION TESTS: Recent Labs    08/14/22 0122 08/15/22 0553 08/16/22 0524 08/18/22 0305  BILITOT 1.6* 1.6* 1.3* 2.3*  AST 16 10* 11* 10*  ALT '11 11 9 10  '$ ALKPHOS 87 66 71 82  PROT 7.4 6.4* 6.5 6.4*  ALBUMIN 3.1* 2.6* 2.7* 2.5*   Drain Location: RUQ Size: Fr size: 10 Fr Date of placement: 08/17/22  Currently to: Drain collection device: gravity 24 hour output:  Output by Drain (mL) 08/16/22 0701 - 08/16/22 1900 08/16/22 1901 - 08/17/22 0700 08/17/22 0701 - 08/17/22 1900 08/17/22 1901 - 08/18/22 0700 08/18/22 0701 - 08/18/22 1342  Biliary Tube Cook slip-coat 10.2 Fr. RUQ   25 50     Interval imaging/drain manipulation:  None  Current examination: Flushes/aspirates easily.  Insertion site unremarkable. Suture and stat lock in place. Dressed appropriately.   Plan: Continue TID flushes with 5 cc NS. Record output Q shift. Dressing changes QD or PRN if soiled.  Call IR APP or on call IR MD if difficulty flushing or sudden change in drain output.  Repeat imaging/possible drain injection once output < 10 mL/QD (  excluding flush material). Consideration for drain removal if output is < 10 mL/QD (excluding flush material), pending discussion with the providing surgical service.  Discharge planning: Please contact IR APP or on call IR MD prior to patient d/c to ensure appropriate follow up plans are in place. Typically patient will follow up with IR clinic 10-14 days post d/c for repeat imaging/possible drain injection. IR scheduler will contact patient with date/time of appointment. Patient will need to flush drain QD with 5 cc NS, record output QD, dressing changes every 2-3 days or earlier if soiled.    IR will continue to follow - please call with questions or concerns.  Assessment and Plan:  Drain intact Will remain x 8 weeks IR will see pt in [facility] for follow up Possible consideration for Spyglass gallstone retrieval procedure  Electronically Signed: Lavonia Drafts, PA-C 08/18/2022, 1:39 PM   I spent a total of 15 Minutes at the the patient's bedside AND on the patient's hospital floor or unit, greater than 50% of which was counseling/coordinating care for perc chole drain

## 2022-08-19 DIAGNOSIS — K810 Acute cholecystitis: Secondary | ICD-10-CM | POA: Diagnosis not present

## 2022-08-19 DIAGNOSIS — E1110 Type 2 diabetes mellitus with ketoacidosis without coma: Secondary | ICD-10-CM

## 2022-08-19 LAB — HEPATIC FUNCTION PANEL
ALT: 11 U/L (ref 0–44)
AST: 22 U/L (ref 15–41)
Albumin: 2.5 g/dL — ABNORMAL LOW (ref 3.5–5.0)
Alkaline Phosphatase: 71 U/L (ref 38–126)
Bilirubin, Direct: 0.2 mg/dL (ref 0.0–0.2)
Indirect Bilirubin: 0.7 mg/dL (ref 0.3–0.9)
Total Bilirubin: 0.9 mg/dL (ref 0.3–1.2)
Total Protein: 6.3 g/dL — ABNORMAL LOW (ref 6.5–8.1)

## 2022-08-19 LAB — BASIC METABOLIC PANEL
Anion gap: 11 (ref 5–15)
Anion gap: 9 (ref 5–15)
BUN: 19 mg/dL (ref 8–23)
BUN: 14 mg/dL (ref 8–23)
CO2: 18 mmol/L — ABNORMAL LOW (ref 22–32)
CO2: 20 mmol/L — ABNORMAL LOW (ref 22–32)
Calcium: 8.6 mg/dL — ABNORMAL LOW (ref 8.9–10.3)
Calcium: 8.6 mg/dL — ABNORMAL LOW (ref 8.9–10.3)
Chloride: 104 mmol/L (ref 98–111)
Chloride: 105 mmol/L (ref 98–111)
Creatinine, Ser: 1.5 mg/dL — ABNORMAL HIGH (ref 0.61–1.24)
Creatinine, Ser: 1.36 mg/dL — ABNORMAL HIGH (ref 0.61–1.24)
GFR, Estimated: 46 mL/min — ABNORMAL LOW (ref >60)
GFR, Estimated: 52 mL/min — ABNORMAL LOW (ref >60)
Glucose, Bld: 127 mg/dL — ABNORMAL HIGH (ref 70–99)
Glucose, Bld: 285 mg/dL — ABNORMAL HIGH (ref 70–99)
Potassium: 3.5 mmol/L (ref 3.5–5.1)
Potassium: 3.4 mmol/L — ABNORMAL LOW (ref 3.5–5.1)
Sodium: 133 mmol/L — ABNORMAL LOW (ref 135–145)
Sodium: 134 mmol/L — ABNORMAL LOW (ref 135–145)

## 2022-08-19 LAB — CBC
HCT: 40 % (ref 39.0–52.0)
Hemoglobin: 13.4 g/dL (ref 13.0–17.0)
MCH: 27.5 pg (ref 26.0–34.0)
MCHC: 33.5 g/dL (ref 30.0–36.0)
MCV: 82.1 fL (ref 80.0–100.0)
Platelets: 427 10*3/uL — ABNORMAL HIGH (ref 150–400)
RBC: 4.87 MIL/uL (ref 4.22–5.81)
RDW: 14.6 % (ref 11.5–15.5)
WBC: 20.8 10*3/uL — ABNORMAL HIGH (ref 4.0–10.5)
nRBC: 0 % (ref 0.0–0.2)

## 2022-08-19 LAB — GLUCOSE, CAPILLARY
Glucose-Capillary: 114 mg/dL — ABNORMAL HIGH (ref 70–99)
Glucose-Capillary: 197 mg/dL — ABNORMAL HIGH (ref 70–99)
Glucose-Capillary: 289 mg/dL — ABNORMAL HIGH (ref 70–99)
Glucose-Capillary: 344 mg/dL — ABNORMAL HIGH (ref 70–99)
Glucose-Capillary: 322 mg/dL — ABNORMAL HIGH (ref 70–99)
Glucose-Capillary: 138 mg/dL — ABNORMAL HIGH (ref 70–99)

## 2022-08-19 MED ORDER — METOCLOPRAMIDE HCL 5 MG/ML IJ SOLN
10.0000 mg | Freq: Four times a day (QID) | INTRAMUSCULAR | Status: DC | PRN
  Administered 2022-08-19: 10 mg via INTRAVENOUS
  Filled 2022-08-19: qty 2

## 2022-08-19 MED ORDER — INSULIN GLARGINE-YFGN 100 UNIT/ML ~~LOC~~ SOLN
20.0000 [IU] | Freq: Every day | SUBCUTANEOUS | Status: DC
  Administered 2022-08-19: 20 [IU] via SUBCUTANEOUS
  Filled 2022-08-19: qty 0.2

## 2022-08-19 MED ORDER — INSULIN GLARGINE-YFGN 100 UNIT/ML ~~LOC~~ SOLN
10.0000 [IU] | Freq: Once | SUBCUTANEOUS | Status: AC
  Administered 2022-08-19: 10 [IU] via SUBCUTANEOUS
  Filled 2022-08-19: qty 0.1

## 2022-08-19 MED ORDER — ENOXAPARIN SODIUM 40 MG/0.4ML IJ SOSY
40.0000 mg | PREFILLED_SYRINGE | INTRAMUSCULAR | Status: DC
  Administered 2022-08-20 – 2022-08-26 (×7): 40 mg via SUBCUTANEOUS
  Filled 2022-08-19 (×7): qty 0.4

## 2022-08-19 MED ORDER — INSULIN ASPART 100 UNIT/ML IJ SOLN
0.0000 [IU] | Freq: Three times a day (TID) | INTRAMUSCULAR | Status: DC
  Administered 2022-08-19 (×2): 7 [IU] via SUBCUTANEOUS
  Administered 2022-08-19: 5 [IU] via SUBCUTANEOUS
  Administered 2022-08-20 – 2022-08-21 (×4): 2 [IU] via SUBCUTANEOUS
  Administered 2022-08-21: 5 [IU] via SUBCUTANEOUS
  Administered 2022-08-22: 1 [IU] via SUBCUTANEOUS
  Administered 2022-08-24: 3 [IU] via SUBCUTANEOUS
  Administered 2022-08-24 (×2): 1 [IU] via SUBCUTANEOUS
  Administered 2022-08-25 (×2): 2 [IU] via SUBCUTANEOUS
  Administered 2022-08-25 – 2022-08-26 (×2): 3 [IU] via SUBCUTANEOUS
  Administered 2022-08-26: 7 [IU] via SUBCUTANEOUS
  Administered 2022-08-26 – 2022-08-27 (×2): 3 [IU] via SUBCUTANEOUS
  Administered 2022-08-27: 5 [IU] via SUBCUTANEOUS
  Administered 2022-08-27: 1 [IU] via SUBCUTANEOUS
  Administered 2022-08-28: 7 [IU] via SUBCUTANEOUS
  Administered 2022-08-28: 2 [IU] via SUBCUTANEOUS

## 2022-08-19 MED ORDER — INSULIN ASPART 100 UNIT/ML IJ SOLN
0.0000 [IU] | Freq: Every day | INTRAMUSCULAR | Status: DC
  Administered 2022-08-21 – 2022-08-25 (×2): 2 [IU] via SUBCUTANEOUS

## 2022-08-19 MED ORDER — INSULIN GLARGINE-YFGN 100 UNIT/ML ~~LOC~~ SOLN
30.0000 [IU] | Freq: Every day | SUBCUTANEOUS | Status: DC
  Administered 2022-08-20 – 2022-08-22 (×3): 30 [IU] via SUBCUTANEOUS
  Filled 2022-08-19 (×4): qty 0.3

## 2022-08-19 MED ORDER — METOCLOPRAMIDE HCL 5 MG/ML IJ SOLN
5.0000 mg | Freq: Four times a day (QID) | INTRAMUSCULAR | Status: DC | PRN
  Administered 2022-08-20 – 2022-08-21 (×3): 5 mg via INTRAVENOUS
  Filled 2022-08-19 (×3): qty 2

## 2022-08-19 MED ORDER — POTASSIUM CHLORIDE CRYS ER 20 MEQ PO TBCR
20.0000 meq | EXTENDED_RELEASE_TABLET | Freq: Once | ORAL | Status: AC
  Administered 2022-08-19: 20 meq via ORAL
  Filled 2022-08-19: qty 1

## 2022-08-19 MED ORDER — POTASSIUM CHLORIDE CRYS ER 20 MEQ PO TBCR
20.0000 meq | EXTENDED_RELEASE_TABLET | Freq: Once | ORAL | Status: AC
Start: 2022-08-19 — End: 2022-08-19
  Administered 2022-08-19: 20 meq via ORAL
  Filled 2022-08-19: qty 1

## 2022-08-19 NOTE — Progress Notes (Addendum)
PROGRESS NOTE    Andre Stout  GPQ:[id number] DOB: March 07, 1940 DOA: 08/14/2022 PCP: [name], FNP   Brief Narrative: 82 year old with past medical history significant for hypertension, prostate cancer, insulin-dependent diabetes, high-grade AV block who presents to [facility] 80/15 with abdominal pain, found to have cholelithiasis and acute cholecystitis.  Cardiology consulted due to history of high-grade AV block, general surgery following. Surgery recommended cholecystostomy tube placement.  Patient underwent cholecystostomy tube placement 8/18 by IR.  Patient developed DKA to 8/19.  He was started on insulin drip.  He was transitioned to long-acting insulin 8/20  Assessment & Plan:   Principal Problem:   Acute cholecystitis Active Problems:   AKI (acute kidney injury) ([place])   Diabetes mellitus without complication ([place])   Acute hypokalemia   Heart block AV complete ([place])   Cholecystitis   DKA, type 2 ([facility])   1-Acute Cholecystitis: Given history of complete heart block, general surgery deferring laparoscopic cholecystectomy for now. -S/P   cholecystostomy tube placement 80/18, follow  culture results; No growth to dates.  -Continue with IV fluids -Continue with IV Zosyn -On clear liquid diet -Appreciate IR and surgery Continue to have nausea, Started on  PRN Reglan.  WBC increase today.   2-Complete heart block Recently referred to electrophysiology outpatient On arrival EKG consistent with complete heart block, although asymptomatic. Cardiology consulted.  Per cardiology this has been chronic and has junctional escape in the 60s.  No plan for PPM at this time. Cardiology patient to undergo open surgical procedure if necessary.  Hopefully percutaneous drain will suffice  3-Diabetes type 2 uncontrolled hyperglycemia, DKA;  A1c 8.3 -CBG elevated, bicarb 12, anion gap 21.on 8/20. -He was started on insulin drip, IV fluids 8/20.  He was transitioned to single  in 20 units daily.  On sliding scale insulin. CBG increasing this afternoon, plan to repeat be met this afternoon. Addendum :  Repeated Bmet with gap closed. Replete potassium. One time dose semglee order. Increase to semglee to 30 units daily starting tomorrow.   Mild elevation of troponin Cardiology  does not think that this is consistent with ACS.  Essential hypertension: IV hydralazine.   AKI: In the setting of hypovolemia and DKA Creatinine peaked to 2.  Trending down today to 1.5  Hypokalemia; replete orally.   Obesity: Need lifestyle modification   Estimated body mass index is 32.72 kg/m as calculated from the following:   Height as of this encounter: $RemoveBeforeD'5\' 5"'[id number]$  (1.651 m).   Weight as of this encounter: 89.2 kg.   DVT prophylaxis: Lovenox Code Status: Full code Family Communication: Family at bedside 8/19.  Disposition Plan:  Status is: Inpatient Remains inpatient appropriate because: management of cholecystitis, DKA    Consultants:  Cardiology Surgery IR  Procedures:  Cholecystostomy tube placement.   Antimicrobials:  IV zosyn.   Subjective: He continued to have nausea that is his main complaint.  He denies abdominal pain.  He vomited a small amount this morning.  Objective: Vitals:   08/18/22 1930 08/18/22 2331 08/19/22 0409 08/19/22 0759  BP: 135/80 (!) 145/77 (!) 159/84 136/68  Pulse: (!) 56 75 90   Resp: 18 16 (!) 25 18  Temp: 98.2 F (36.8 C) 98 F (36.7 C) 98 F (36.7 C) 98.5 F (36.9 C)  TempSrc: Oral Axillary Oral Oral  SpO2: 98% 100% 95% 97%  Weight:      Height:        Intake/Output Summary (Last 24 hours) at 08/19/2022 1613 Last  data filed at 08/19/2022 0950 Gross per 24 hour  Intake 2538.9 ml  Output 400 ml  Net 2138.9 ml   Filed Weights   08/14/22 1654 08/16/22 1952 08/17/22 0005  Weight: 83.1 kg 90.4 kg 89.2 kg    Examination:  General exam: NAD Respiratory system: CTA Cardiovascular system: S 1, S 2 RRR Gastrointestinal  system: BS present, soft, nt Central nervous system: alert Extremities: no edema    Data Reviewed: I have personally reviewed following labs and imaging studies  CBC: Recent Labs  Lab 08/14/22 0122 08/15/22 0553 08/16/22 0524 08/18/22 0533 08/19/22 1248  WBC 19.9* 12.8* 10.8* 16.9* 20.8*  NEUTROABS 16.1*  --   --  13.5*  --   HGB 15.0 12.9* 12.6* 14.3 13.4  HCT 45.8 39.6 39.0 43.9 40.0  MCV 83.7 84.4 85.7 83.5 82.1  PLT 334 302 327 449* 546*   Basic Metabolic Panel: Recent Labs  Lab 08/14/22 1749 08/15/22 0553 08/16/22 0524 08/18/22 0305 08/18/22 1250 08/18/22 1617 08/18/22 2029 08/19/22 0128  NA  --    < > 138 136 137 140 137 133*  K  --    < > 3.8 4.4 4.0 3.7 3.4* 3.5  CL  --    < > 106 103 103 109 106 104  CO2  --    < > 23 12* 15* 18* 22 18*  GLUCOSE  --    < > 133* 329* 401* 209* 228* 127*  BUN  --    < > 12 12 20 22 20 19   CREATININE  --    < > 1.15 1.57* 2.09* 1.97* 1.69* 1.50*  CALCIUM  --    < > 8.5* 8.9 9.2 9.3 9.1 8.6*  MG 1.8  --  1.9 1.8  --   --   --   --   PHOS 3.0  --   --   --   --   --   --   --    < > = values in this interval not displayed.   GFR: Estimated Creatinine Clearance: 39.7 mL/min (A) (by C-G formula based on SCr of 1.5 mg/dL (H)). Liver Function Tests: Recent Labs  Lab 08/14/22 0122 08/15/22 0553 08/16/22 0524 08/18/22 0305 08/19/22 1248  AST 16 10* 11* 10* 22  ALT 11 11 9 10 11   ALKPHOS 87 66 71 82 71  BILITOT 1.6* 1.6* 1.3* 2.3* 0.9  PROT 7.4 6.4* 6.5 6.4* 6.3*  ALBUMIN 3.1* 2.6* 2.7* 2.5* 2.5*   No results for input(s): "LIPASE", "AMYLASE" in the last 168 hours. No results for input(s): "AMMONIA" in the last 168 hours. Coagulation Profile: Recent Labs  Lab 08/14/22 1200  INR 1.3*   Cardiac Enzymes: No results for input(s): "CKTOTAL", "CKMB", "CKMBINDEX", "TROPONINI" in the last 168 hours. BNP (last 3 results) No results for input(s): "PROBNP" in the last 8760 hours. HbA1C: No results for input(s): "HGBA1C"  in the last 72 hours. CBG: Recent Labs  Lab 08/19/22 0108 08/19/22 0437 08/19/22 0623 08/19/22 1152 08/19/22 1604  GLUCAP 114* 197* 289* 344* 322*   Lipid Profile: No results for input(s): "CHOL", "HDL", "LDLCALC", "TRIG", "CHOLHDL", "LDLDIRECT" in the last 72 hours. Thyroid Function Tests: No results for input(s): "TSH", "T4TOTAL", "FREET4", "T3FREE", "THYROIDAB" in the last 72 hours. Anemia Panel: No results for input(s): "VITAMINB12", "FOLATE", "FERRITIN", "TIBC", "IRON", "RETICCTPCT" in the last 72 hours. Sepsis Labs: No results for input(s): "PROCALCITON", "LATICACIDVEN" in the last 168 hours.  Recent Results (from the past  240 hour(s))  Aerobic/Anaerobic Culture w Gram Stain (surgical/deep wound)     Status: None (Preliminary result)   Collection Time: 08/17/22  5:23 PM   Specimen: PATH GI biopsy; Bile  Result Value Ref Range Status   Specimen Description BILE  Final   Special Requests NONE  Final   Gram Stain NO WBC SEEN NO ORGANISMS SEEN   Final   Culture   Final    NO GROWTH 2 DAYS NO ANAEROBES ISOLATED; CULTURE IN PROGRESS FOR 5 DAYS Performed at [facility], [address], [place], [place] [address]    Report Status PENDING  Incomplete         Radiology Studies: No results found.      Scheduled Meds:  aspirin  81 mg Oral QHS   brimonidine  1 drop Both Eyes BID WC   dorzolamide-timolol  1 drop Both Eyes BID WC   enoxaparin (LOVENOX) injection  30 mg Subcutaneous Q24H   hydrALAZINE  10 mg Intravenous Q8H   insulin aspart  0-5 Units Subcutaneous QHS   insulin aspart  0-9 Units Subcutaneous TID WC   insulin glargine-yfgn  20 Units Subcutaneous Daily   latanoprost  1 drop Both Eyes QHS   sodium chloride flush  5 mL Intracatheter Q8H   Continuous Infusions:  sodium chloride 100 mL/hr at 08/19/22 0919   piperacillin-tazobactam (ZOSYN)  IV 3.375 g (08/19/22 1202)     LOS: 5 days    Time spent: 35 minutes    Hortence Charter A Calbert Hulsebus,  MD [facility]   If 7PM-7AM, please contact night-coverage [email or web address]  08/19/2022, 4:13 PM

## 2022-08-19 NOTE — Progress Notes (Signed)
Subjective: CC: Nausea improved. Tolerating cld and taking in more without vomiting. No abdominal pain.   Objective: Vital signs in last 24 hours: Temp:  [98 F (36.7 C)-98.5 F (36.9 C)] 98.5 F (36.9 C) (08/20 0759) Pulse Rate:  [52-90] 90 (08/20 0409) Resp:  [16-25] 18 (08/20 0759) BP: (132-159)/(56-84) 136/68 (08/20 0759) SpO2:  [95 %-100 %] 97 % (08/20 0759) Last BM Date : 08/18/22  Intake/Output from previous day: 08/19 0701 - 08/20 0700 In: 2508.6 [I.V.:2426.3; IV Piggyback:72.3] Out: 600 [Urine:350; Drains:250] Intake/Output this shift: No intake/output data recorded.  PE: Gen:  Alert, NAD, pleasant Abd: Soft, ND, NT, +BS, IR drain with bilious fluid in bag  Lab Results:  Recent Labs    08/18/22 0533  WBC 16.9*  HGB 14.3  HCT 43.9  PLT 449*   BMET Recent Labs    08/18/22 2029 08/19/22 0128  NA 137 133*  K 3.4* 3.5  CL 106 104  CO2 22 18*  GLUCOSE 228* 127*  BUN 20 19  CREATININE 1.69* 1.50*  CALCIUM 9.1 8.6*   PT/INR No results for input(s): "LABPROT", "INR" in the last 72 hours. CMP     Component Value Date/Time   NA 133 (L) 08/19/2022 0128   K 3.5 08/19/2022 0128   CL 104 08/19/2022 0128   CO2 18 (L) 08/19/2022 0128   GLUCOSE 127 (H) 08/19/2022 0128   BUN 19 08/19/2022 0128   CREATININE 1.50 (H) 08/19/2022 0128   CREATININE 1.11 01/12/2022 1541   CALCIUM 8.6 (L) 08/19/2022 0128   PROT 6.4 (L) 08/18/2022 0305   ALBUMIN 2.5 (L) 08/18/2022 0305   AST 10 (L) 08/18/2022 0305   ALT 10 08/18/2022 0305   ALKPHOS 82 08/18/2022 0305   BILITOT 2.3 (H) 08/18/2022 0305   GFRNONAA 46 (L) 08/19/2022 0128   GFRAA >60 03/07/2016 0337   Lipase     Component Value Date/Time   LIPASE 22 03/04/2016 1149    Studies/Results: No results found.  Anti-infectives: Anti-infectives (From admission, onward)    Start     Dose/Rate Route Frequency Ordered Stop   08/14/22 2000  piperacillin-tazobactam (ZOSYN) IVPB 3.375 g        3.375 g 12.5  mL/hr over 240 Minutes Intravenous Every 8 hours 08/14/22 1516     08/14/22 1145  piperacillin-tazobactam (ZOSYN) IVPB 3.375 g        3.375 g 12.5 mL/hr over 240 Minutes Intravenous  Once 08/14/22 1138 08/14/22 1632        Assessment/Plan Cholecystitis - S/p Perc chole 8/18. Cx's pending. Cont abx. Would recommend 10d abx.  - It appears nausea has been his main symptom. Never significant pain. After Perc Drain his nausea is now improved. Will advance diet.  -we will temporize/treat his cholecystitis with a perc drain for now, continue cards follow up/work up for complete heart block and then we will see him in 6-8 weeks and see where things stand and if he is safe at that point to pursue interval cholecystectomy. - Advancing diet. Repeat labs pending. If tolerating diet and repeat labs are improving, he can be d/c'd from our standpoint. I have sent for follow up.    FEN - HH VTE - lovenox ID - [email or web address]   Complete heart block HTN DM H/o prostate cancer   LOS: 5 days    Andre Stout , [facility] Surgery 08/19/2022, 12:36 PM Please see Amion for pager number during day hours 7:00am-4:30pm

## 2022-08-19 NOTE — Evaluation (Signed)
Physical Therapy Evaluation Patient Details Name: Andre Stout MRN: [id number] DOB: 1940/09/23 Today's Date: 08/19/2022  History of Present Illness  Andre Stout is a 82 y.o. 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  Underwent percutaneous cholecystostomy tube placement on 8/18.  Clinical Impression  Patient presents with decreased mobility due to generalized weakness and decreased activity tolerance.  Currently needing min A overall and incontinent of bowel in the bed.  He was previously able to attend to his needs at home and had intermittent family support.  He will benefit from continued skilled PT in the acute setting and follow up HHPT at d/c if family able to provide daytime support.  If not may need STSNF.      Recommendations for follow up therapy are one component of a multi-disciplinary discharge planning process, led by the attending physician.  Recommendations may be updated based on patient status, additional functional criteria and insurance authorization.  Follow Up Recommendations Home health PT      Assistance Recommended at Discharge Frequent or constant Supervision/Assistance  Patient can return home with the following  A little help with walking and/or transfers;A little help with bathing/dressing/bathroom;Assistance with cooking/housework;Help with stairs or ramp for entrance;Assist for transportation;Direct supervision/assist for financial management;Direct supervision/assist for medications management    Equipment Recommendations None recommended by PT  Recommendations for Other Services       Functional Status Assessment Patient has had a recent decline in their functional status and demonstrates the ability to make significant improvements in function in a reasonable and predictable amount of time.      Precautions / Restrictions Precautions Precautions: Fall Precaution Comments: R cholecystostomy drain      Mobility  Bed Mobility Overal bed mobility: Needs Assistance Bed Mobility: Rolling, Sidelying to Sit Rolling: Supervision Sidelying to sit: Min guard       General bed mobility comments: rolled in bed for hygiene due to soiled from diarrhea, side to sit with minguard for balance using rails for all bed mobility    Transfers Overall transfer level: Needs assistance Equipment used: Rolling walker (2 wheels) Transfers: Sit to/from Stand Sit to Stand: Min assist, Min guard           General transfer comment: up from EOB cues for hand placement and some lifting assist; minguard assist from toilet in bathroom with grabbar    Ambulation/Gait Ambulation/Gait assistance: Min guard, Min assist Gait Distance (Feet): 90 Feet Assistive device: Rolling walker (2 wheels) Gait Pattern/deviations: Step-through pattern, Decreased stride length, Decreased dorsiflexion - right, Shuffle       General Gait Details: dragging R toes initially, improved with distance  Stairs            Wheelchair Mobility    Modified Rankin (Stroke Patients Only)       Balance Overall balance assessment: Needs assistance Sitting-balance support: No upper extremity supported Sitting balance-Leahy Scale: Fair     Standing balance support: Bilateral upper extremity supported Standing balance-Leahy Scale: Poor Standing balance comment: UE support for balance, uses RW at home                             Pertinent Vitals/Pain Pain Assessment Faces Pain Scale: Hurts little more Pain Location: abdomen Pain Descriptors / Indicators: Grimacing, Guarding Pain Intervention(s): Monitored during session    Home  Living Family/patient expects to be discharged to:: Private residence Living Arrangements: Other relatives Available Help at Discharge: Family;Available  PRN/intermittently Type of Home: House Home Access: Stairs to enter Entrance Stairs-Rails: None Entrance Stairs-Number of Steps: 5   Home Layout: One level Home Equipment: Conservation officer, nature (2 wheels);Cane - single point;Shower seat Additional Comments: pt reports grandson works during [employer] ay    Prior Function Prior Level of Function : Independent/Modified Independent;Driving;History of Falls (last six months)             Mobility Comments: Has been using RW for mobility and uses walk in shower ADLs Comments: pt reports independent with ADLs, grandson assist with IADLs (meals, meds)     Hand Dominance        Extremity/Trunk Assessment   Upper Extremity Assessment Upper Extremity Assessment: Overall WFL for tasks assessed    Lower Extremity Assessment Lower Extremity Assessment: Generalized weakness    Cervical / Trunk Assessment Cervical / Trunk Assessment: Kyphotic  Communication      Cognition Arousal/Alertness: Awake/alert Behavior During Therapy: WFL for tasks assessed/performed Overall Cognitive Status: No family/caregiver present to determine baseline cognitive functioning Area of Impairment: Safety/judgement, Attention, Following commands, Problem solving                 Orientation Level: Disoriented to, Time, Situation Current Attention Level: Selective Memory: Decreased short-term memory Following Commands: Follows one step commands with increased time, Follows one step commands consistently Safety/Judgement: Decreased awareness of safety   Problem Solving: Slow processing, Requires verbal cues          General Comments General comments (skin integrity, edema, etc.): assist for hygiene after toileting in bathroom    Exercises     Assessment/Plan    PT Assessment Patient needs continued PT services  PT Problem List Decreased strength;Decreased mobility;Decreased activity tolerance;Decreased balance;Decreased knowledge of use of  DME;Decreased safety awareness       PT Treatment Interventions DME instruction;Therapeutic exercise;Gait training;Balance training;Stair training;Functional mobility training;Therapeutic activities;Patient/family education    PT Goals (Current goals can be found in the Care Plan section)  Acute Rehab PT Goals Patient Stated Goal: return home PT Goal Formulation: With patient Time For Goal Achievement: 09/02/22 Potential to Achieve Goals: Good    Frequency Min 3X/week     Co-evaluation               AM-PAC PT "6 Clicks" Mobility  Outcome Measure Help needed turning from your back to your side while in a flat bed without using bedrails?: A Little Help needed moving from lying on your back to sitting on the side of a flat bed without using bedrails?: A Little Help needed moving to and from a bed to a chair (including a wheelchair)?: A Little Help needed standing up from a chair using your arms (e.g., wheelchair or bedside chair)?: A Little Help needed to walk in hospital room?: A Little Help needed climbing 3-5 steps with a railing? : Total 6 Click Score: 16    End of Session Equipment Utilized During Treatment: Gait belt Activity Tolerance: Patient tolerated treatment well Patient left: with call bell/phone within reach;in chair   PT Visit Diagnosis: Other abnormalities of gait and mobility (R26.89);Muscle weakness (generalized) (M62.81)    Time: 2706-2376 PT Time Calculation (min) (ACUTE ONLY): 36 min   Charges:   PT Evaluation $PT Eval Moderate Complexity: 1 Mod PT Treatments $Gait Training: 8-22 mins        Magda Kiel, PT Acute Rehabilitation Services [phone]  08/19/2022   Reginia Naas 08/19/2022, 11:27 AM

## 2022-08-19 NOTE — Progress Notes (Signed)
Pt feels nauseous not eating clear liquids.

## 2022-08-20 DIAGNOSIS — E876 Hypokalemia: Secondary | ICD-10-CM | POA: Diagnosis not present

## 2022-08-20 DIAGNOSIS — I10 Essential (primary) hypertension: Secondary | ICD-10-CM

## 2022-08-20 DIAGNOSIS — E871 Hypo-osmolality and hyponatremia: Secondary | ICD-10-CM

## 2022-08-20 DIAGNOSIS — N179 Acute kidney failure, unspecified: Secondary | ICD-10-CM | POA: Diagnosis not present

## 2022-08-20 DIAGNOSIS — K810 Acute cholecystitis: Secondary | ICD-10-CM | POA: Diagnosis not present

## 2022-08-20 DIAGNOSIS — E119 Type 2 diabetes mellitus without complications: Secondary | ICD-10-CM

## 2022-08-20 DIAGNOSIS — E669 Obesity, unspecified: Secondary | ICD-10-CM

## 2022-08-20 DIAGNOSIS — I5A Non-ischemic myocardial injury (non-traumatic): Secondary | ICD-10-CM

## 2022-08-20 LAB — COMPREHENSIVE METABOLIC PANEL
ALT: 9 U/L (ref 0–44)
AST: 14 U/L — ABNORMAL LOW (ref 15–41)
Albumin: 2.2 g/dL — ABNORMAL LOW (ref 3.5–5.0)
Alkaline Phosphatase: 63 U/L (ref 38–126)
Anion gap: 7 (ref 5–15)
BUN: 10 mg/dL (ref 8–23)
CO2: 23 mmol/L (ref 22–32)
Calcium: 8.3 mg/dL — ABNORMAL LOW (ref 8.9–10.3)
Chloride: 108 mmol/L (ref 98–111)
Creatinine, Ser: 1.18 mg/dL (ref 0.61–1.24)
GFR, Estimated: >60 mL/min (ref >60)
Glucose, Bld: 190 mg/dL — ABNORMAL HIGH (ref 70–99)
Potassium: 3.1 mmol/L — ABNORMAL LOW (ref 3.5–5.1)
Sodium: 138 mmol/L (ref 135–145)
Total Bilirubin: 0.8 mg/dL (ref 0.3–1.2)
Total Protein: 5.6 g/dL — ABNORMAL LOW (ref 6.5–8.1)

## 2022-08-20 LAB — GLUCOSE, CAPILLARY
Glucose-Capillary: 179 mg/dL — ABNORMAL HIGH (ref 70–99)
Glucose-Capillary: 181 mg/dL — ABNORMAL HIGH (ref 70–99)
Glucose-Capillary: 161 mg/dL — ABNORMAL HIGH (ref 70–99)
Glucose-Capillary: 140 mg/dL — ABNORMAL HIGH (ref 70–99)

## 2022-08-20 MED ORDER — ENSURE ENLIVE PO LIQD
237.0000 mL | Freq: Two times a day (BID) | ORAL | Status: DC
  Administered 2022-08-20 – 2022-08-26 (×9): 237 mL via ORAL

## 2022-08-20 MED ORDER — POTASSIUM CHLORIDE 20 MEQ PO PACK
40.0000 meq | PACK | Freq: Two times a day (BID) | ORAL | Status: AC
Start: 2022-08-20 — End: 2022-08-20
  Administered 2022-08-20 (×2): 40 meq via ORAL
  Filled 2022-08-20 (×2): qty 2

## 2022-08-20 MED ORDER — SODIUM CHLORIDE 0.9 % IV SOLN
3.0000 g | Freq: Three times a day (TID) | INTRAVENOUS | Status: AC
  Administered 2022-08-20 – 2022-08-24 (×12): 3 g via INTRAVENOUS
  Filled 2022-08-20 (×13): qty 8

## 2022-08-20 NOTE — Assessment & Plan Note (Signed)
Resolved to baseline 

## 2022-08-20 NOTE — Assessment & Plan Note (Addendum)
BP normal - Continue PRN hydralazine

## 2022-08-20 NOTE — Assessment & Plan Note (Signed)
Euvolemic, asymptomatic

## 2022-08-20 NOTE — Assessment & Plan Note (Signed)
-   Continue semglee - Continue SS corrections - Continue aspirin

## 2022-08-20 NOTE — Evaluation (Signed)
Occupational Therapy Evaluation Patient Details Name: Andre Stout MRN: [id number] DOB: September 02, 1940 Today's Date: 08/20/2022   History of Present Illness Andre Stout is a 82 y.o. 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  Underwent percutaneous cholecystostomy tube placement on 8/18.   Clinical Impression   Pt independent at baseline with ADLs, uses RW for mobility and lives with grandson who can provide PRN assist. Pt with noted cognitive deficits, slow processing time and decreased initiation, pt able to state he is at "a hospital in [place]" but cannot recall name, also stating it is June. Pt with nausea throughout session, needing min-max A for ADLs, min guard for bed mobility, and min guard-minA for transfers with RW. Pt presenting with impairments listed below, will follow acutely. Recommend HHOT at d/c.     Recommendations for follow up therapy are one component of a multi-disciplinary discharge planning process, led by the attending physician.  Recommendations may be updated based on patient status, additional functional criteria and insurance authorization.   Follow Up Recommendations  Home health OT    Assistance Recommended at Discharge Frequent or constant Supervision/Assistance  Patient can return home with the following A little help with walking and/or transfers;A lot of help with bathing/dressing/bathroom;Assistance with cooking/housework;Direct supervision/assist for medications management;Direct supervision/assist for financial management;Assist for transportation;Help with stairs or ramp for entrance    Functional Status Assessment  Patient has had a recent decline in their functional status and demonstrates the ability to make significant improvements in function in a reasonable and predictable amount of time.   Equipment Recommendations  None recommended by OT;Other (comment) (pt has all needed DME)    Recommendations for Other Services PT consult     Precautions / Restrictions Precautions Precautions: Fall Precaution Comments: R cholecystostomy drain Restrictions Weight Bearing Restrictions: No      Mobility Bed Mobility Overal bed mobility: Needs Assistance Bed Mobility: Rolling, Sidelying to Sit Rolling: Supervision Sidelying to sit: Min guard   Sit to supine: Min guard        Transfers Overall transfer level: Needs assistance Equipment used: Rolling walker (2 wheels) Transfers: Sit to/from Stand Sit to Stand: Min assist, Min guard                  Balance Overall balance assessment: Needs assistance Sitting-balance support: No upper extremity supported Sitting balance-Leahy Scale: Fair     Standing balance support: Bilateral upper extremity supported Standing balance-Leahy Scale: Poor Standing balance comment: UE support for balance, uses RW at home                           ADL either performed or assessed with clinical judgement   ADL Overall ADL's : Needs assistance/impaired Eating/Feeding: Supervision/ safety   Grooming: Supervision/safety   Upper Body Bathing: Minimal assistance   Lower Body Bathing: Maximal assistance   Upper Body Dressing : Minimal assistance   Lower Body Dressing: Maximal assistance Lower Body Dressing Details (indicate cue type and reason): to pull up socks Toilet Transfer: Minimal assistance;Rolling walker (2 wheels);Ambulation;Regular Glass blower/designer Details (indicate cue type and reason): simulated         Functional mobility during ADLs: Rolling walker (2 wheels);Min guard;Minimal assistance       Vision   Vision Assessment?: No apparent visual deficits Additional Comments: glaucoma hx  Perception     Praxis      Pertinent Vitals/Pain Pain Assessment Pain Assessment: Faces Pain  Score: 4  Faces Pain Scale: Hurts little more Pain Location: abdomen Pain Descriptors / Indicators: Grimacing, Guarding Pain Intervention(s): Limited activity within patient's tolerance, Monitored during session, Repositioned     Hand Dominance Right   Extremity/Trunk Assessment Upper Extremity Assessment Upper Extremity Assessment: Overall WFL for tasks assessed   Lower Extremity Assessment Lower Extremity Assessment: Defer to PT evaluation   Cervical / Trunk Assessment Cervical / Trunk Assessment: Kyphotic   Communication Communication Communication: No difficulties   Cognition Arousal/Alertness: Awake/alert Behavior During Therapy: WFL for tasks assessed/performed Overall Cognitive Status: No family/caregiver present to determine baseline cognitive functioning                   Orientation Level: Disoriented to, Time, Situation, Place (able to state "hospital in [place]") Current Attention Level: Selective Memory: Decreased short-term memory Following Commands: Follows one step commands with increased time, Follows one step commands consistently Safety/Judgement: Decreased awareness of safety   Problem Solving: Slow processing, Requires verbal cues General Comments: increased processing time to perfrom requests,     General Comments  pt nauseaus throughout session    Exercises     Shoulder Instructions      Home Living Family/patient expects to be discharged to:: Private residence Living Arrangements: Other relatives (grandson) Available Help at Discharge: Family;Available PRN/intermittently Type of Home: House Home Access: Stairs to enter [employer] of Steps: 5 Entrance Stairs-Rails: None Home Layout: One level     Bathroom Shower/Tub: Walk-in shower;Tub/shower unit   Bathroom Toilet: Standard     Home Equipment: Conservation officer, nature (2 wheels);Cane - single point;Shower seat   Additional Comments: pt reports grandson works during [employer]  ay      Prior Functioning/Environment Prior Level of Function : Independent/Modified Independent;Driving;History of Falls (last six months)             Mobility Comments: Has been using RW for mobility and uses walk in shower ADLs Comments: pt reports independent with ADLs, grandson assist with IADLs (meals, meds)        OT Problem List: Decreased strength;Decreased activity tolerance;Impaired balance (sitting and/or standing);Impaired vision/perception;Decreased safety awareness;Decreased knowledge of use of DME or AE;Decreased knowledge of precautions;Obesity;Decreased cognition      OT Treatment/Interventions: Self-care/ADL training;Therapeutic exercise;DME and/or AE instruction;Therapeutic activities;Cognitive remediation/compensation;Visual/perceptual remediation/compensation;Patient/family education;Balance training    OT Goals(Current goals can be found in the care plan section) Acute Rehab OT Goals Patient Stated Goal: to get better OT Goal Formulation: With patient Time For Goal Achievement: 09/03/22 Potential to Achieve Goals: Good ADL Goals Pt Will Perform Upper Body Dressing: with supervision;sitting Pt Will Perform Lower Body Dressing: with min assist;sit to/from stand;sitting/lateral leans Pt Will Transfer to Toilet: with min guard assist;regular height toilet;ambulating Pt Will Perform Tub/Shower Transfer: Tub transfer;Shower transfer;shower seat;ambulating;with min assist  OT Frequency: Min 2X/week    Co-evaluation              AM-PAC OT "6 Clicks" Daily Activity     Outcome Measure Help from another person eating meals?: None Help from another person taking care of personal grooming?: A Little Help from another person toileting, which includes using toliet, bedpan, or urinal?: A Little Help from another person bathing (including washing, rinsing, drying)?: A Lot Help from another person to put on and taking off regular upper body clothing?: A  Little Help from another person to put on and taking  off regular lower body clothing?: A Lot 6 Click Score: 17   End of Session Equipment Utilized During Treatment: Rolling walker (2 wheels) Nurse Communication: Mobility status  Activity Tolerance: Patient tolerated treatment well Patient left: with call bell/phone within reach;in bed;with bed alarm set  OT Visit Diagnosis: Other abnormalities of gait and mobility (R26.89);Muscle weakness (generalized) (M62.81);Other symptoms and signs involving cognitive function                Time: 1281-1886 OT Time Calculation (min): 21 min Charges:  OT General Charges $OT Visit: 1 Visit OT Evaluation $OT Eval Moderate Complexity: 1 [address] OTD, OTR/L Acute Rehab [phone] - Grand 08/20/2022, 12:35 PM

## 2022-08-20 NOTE — Assessment & Plan Note (Signed)
BMI 32 

## 2022-08-20 NOTE — Assessment & Plan Note (Signed)
Resolved with supplementation and starting spironolactone. 

## 2022-08-20 NOTE — Progress Notes (Signed)
Referring Physician(s): Dr. Dillard Cannon  Supervising Physician: Daryll [name]  Patient Status:  [facility] - In-pt  Chief Complaint:  Right upper quadrant pain with vomiting.  Found to have acute cholecystitis.  Status post cholecystostomy tube on August 17, 2022 by Dr. Serafina Royals  Subjective:  Patient seen at bedside with son present.  Patient states there is no improvement in his pain since drain placement.  Patient states pain is worse when eating or swallowing.  Allergies: Patient has no known allergies.  Medications: Prior to Admission medications   Medication Sig Start Date End Date Taking? Authorizing Provider  acetaminophen (TYLENOL) 500 MG tablet Take 1,000 mg by mouth daily as needed (pain).   Yes [provider]  aspirin 81 MG chewable tablet Chew 1 tablet (81 mg total) by mouth daily. Patient taking differently: Chew 81 mg by mouth at bedtime. 01/06/22  Yes Swayze, Ava, DO  brimonidine (ALPHAGAN) 0.2 % ophthalmic solution Place 1 drop into both eyes 2 (two) times daily with breakfast and lunch. 12/18/21  Yes [provider]  cetirizine (ZYRTEC) 10 MG tablet Take 10 mg by mouth daily as needed (seasonal allergies).   Yes [provider]  insulin NPH-regular Human (70-30) 100 UNIT/ML injection Inject 40 Units into the skin 2 (two) times daily with a meal. Patient taking differently: Inject 36 Units into the skin 2 (two) times daily with a meal. 08/04/22  Yes Pahwani, Ravi, MD  latanoprost (XALATAN) 0.005 % ophthalmic solution Place 1 drop into both eyes at bedtime. 01/01/22  Yes [provider]  magnesium oxide (MAG-OX) 400 (240 Mg) MG tablet Take 400 mg by mouth at bedtime. 06/06/22  Yes [provider]  Multiple Vitamins-Minerals (PRESERVISION AREDS 2) CAPS Take 1 capsule by mouth at bedtime.   Yes [provider]  amLODipine (NORVASC) 10 MG tablet Take 1 tablet (10 mg total) by mouth daily. Patient not taking: Reported on 08/14/2022  08/05/22 09/04/22  Darliss Cheney, MD  BD INSULIN SYRINGE U/F 31G X 5/16" 1 ML MISC USE 2 DAILY 08/24/21   Renato Shin, MD  dorzolamide-timolol (COSOPT) 22.3-6.8 MG/ML ophthalmic solution Place 1 drop into both eyes 2 (two) times daily with breakfast and lunch. 05/18/22   [provider]  lisinopril (ZESTRIL) 20 MG tablet Take 1 tablet (20 mg total) by mouth daily. Patient not taking: Reported on 08/14/2022 08/05/22 09/04/22  Darliss Cheney, MD  [facility] DELICA LANCETS 28N MISC Use to check blood sugar 4 times per day. Dx code: E11.9 03/27/16   Renato Shin, MD  [facility] ULTRA test strip USE TO MONITOR GLUCOSE LEVELS 4 TIMES PER DAY E11.9 06/10/19   Renato Shin, MD     Vital Signs: BP (!) 143/83 (BP Location: Left Arm)   Pulse 87   Temp 98.3 F (36.8 C) (Oral)   Resp 19   Ht '5\' 5"'$  (1.651 m)   Wt 196 lb 10.4 oz (89.2 kg)   SpO2 99%   BMI 32.72 kg/m   Physical Exam Vitals and nursing note reviewed.  Constitutional:      Appearance: He is well-developed.  HENT:     Head: Normocephalic.  Pulmonary:     Effort: Pulmonary effort is normal.  Abdominal:     Comments: Positive right upper quadrant drain to gravity bag. Site is unremarkable with no erythema, edema, tenderness, bleeding or drainage noted at exit site. Suture and stat lock in place. Dressing is clean dry and intact.  40 ml of dark green  colored fluid noted in gravity bag. Drain is able to be flushed easily.    Musculoskeletal:        General: Normal range of motion.     Cervical back: Normal range of motion.  Skin:    General: Skin is dry.  Neurological:     Mental Status: He is alert and oriented to person, place, and time.     Imaging: No results found.  Labs:  CBC: Recent Labs    08/15/22 0553 08/16/22 0524 08/18/22 0533 08/19/22 1248  WBC 12.8* 10.8* 16.9* 20.8*  HGB 12.9* 12.6* 14.3 13.4  HCT 39.6 39.0 43.9 40.0  PLT 302 327 449* 427*    COAGS: Recent Labs    08/14/22 1200  INR 1.3*  APTT 31     BMP: Recent Labs    08/18/22 2029 08/19/22 0128 08/19/22 1622 08/20/22 0726  NA 137 133* 134* 138  K 3.4* 3.5 3.4* 3.1*  CL 106 104 105 108  CO2 22 18* 20* 23  GLUCOSE 228* 127* 285* 190*  BUN '20 19 14 10  '$ CALCIUM 9.1 8.6* 8.6* 8.3*  CREATININE 1.69* 1.50* 1.36* 1.18  GFRNONAA 40* 46* 52* >60    LIVER FUNCTION TESTS: Recent Labs    08/16/22 0524 08/18/22 0305 08/19/22 1248 08/20/22 0726  BILITOT 1.3* 2.3* 0.9 0.8  AST 11* 10* 22 14*  ALT '9 10 11 9  '$ ALKPHOS 71 82 71 63  PROT 6.5 6.4* 6.3* 5.6*  ALBUMIN 2.7* 2.5* 2.5* 2.2*    Assessment and Plan:  82 year old male.  Inpatient.  History of hypertension, diabetes, prostate cancer, IDDM, AV block.Presented to the ED at [facility] with RUQ pain and vomiting X 3 days. [place] from 8.17.23 reads Cholelithiasis with findings suspicious for acute cholecystitis. CT Abd pelvis from 8.15.23 reads  Cholelithiasis with gallbladder wall thickening and surrounding inflammatory changes, possible acute cholecystitis. Ultrasound is recommended for further evaluation. Patient was deemed not to be a surgical candidate at this time.  IR placed a cholecystostomy tube on August 17, 2022.  Drain Location: RUQ Size: Fr size: 10 Fr Date of placement: 8.18.23  Currently to: Drain collection device: gravity 24 hour output:  Output by Drain (mL) 08/18/22 0700 - 08/18/22 1459 08/18/22 1500 - 08/18/22 2259 08/18/22 2300 - 08/19/22 0659 08/19/22 0700 - 08/19/22 1459 08/19/22 1500 - 08/19/22 2259 08/19/22 2300 - 08/20/22 0659 08/20/22 0700 - 08/20/22 1459 08/20/22 1500 - 08/20/22 1556  Biliary Tube Cook slip-coat 10.2 Fr. RUQ 200 0 50   30    40 mL of dark green fluid noted to be in gravity bag.  Cultures from gallbladder show no growth in 3 days.  White blood cell count 28.8.  Total protein 5.6, AST less than 14.  Potassium 3.1.  Interval imaging/drain manipulation:  None since drain placement  Current examination: Flushes/aspirates easily.  Insertion site  unremarkable. Suture and stat lock in place. Dressed appropriately.   Plan: Continue TID flushes with 5 cc NS. Record output Q shift. Dressing changes QD or PRN if soiled.  Call IR APP or on call IR MD if difficulty flushing or sudden change in drain output.   Discharge planning: Please contact IR APP or on call IR MD prior to patient d/c to ensure appropriate follow up plans are in place. Typically patient will follow up with IR clinic 6 to 8 weeks post d/c for repeat imaging/possible drain injection. IR scheduler will contact patient with date/time of appointment. Patient will need to flush  drain QD with 5 cc NS, record output QD, dressing changes every 2-3 days or earlier if soiled.   IR will continue to follow - please call with questions or concerns.  Electronically Signed: Jacqualine Mau, NP 08/20/2022, 3:54 PM   I spent a total of 15 Minutes at the patient's bedside AND on the patient's hospital floor or unit, greater than 50% of which was counseling/coordinating care for cholecystostomy tube placement

## 2022-08-20 NOTE — Progress Notes (Signed)
Pharmacy Antibiotic Note  Andre Stout is a 82 y.o. male on day # 26 Zosyn for intra-abdominal infection/ cholecystitis. No surgery planned. S/p percutaneous drain placement on 08/17/22. No growth in bile culture to date.  Pharmacy has been consulted for Unasyn dosing. Last Zosyn dose ~4am today.  Plan: Unasyn 3gm IV q8hrs. Follow renal function, final culture data. To advance diet today; f/u change to oral antibiotic when able. Noted plan for 10 days antibiotics.  Height: '5\' 5"'$  (165.1 cm) Weight: 89.2 kg (196 lb 10.4 oz) IBW/kg (Calculated) : 61.5  Temp (24hrs), Avg:98.3 F (36.8 C), Min:97.9 F (36.6 C), Max:98.7 F (37.1 C)  Recent Labs  Lab 08/14/22 0122 08/15/22 0553 08/16/22 0524 08/18/22 0305 08/18/22 0533 08/18/22 1250 08/18/22 1617 08/18/22 2029 08/19/22 0128 08/19/22 1248 08/19/22 1622 08/20/22 0726  WBC 19.9* 12.8* 10.8*  --  16.9*  --   --   --   --  20.8*  --   --   CREATININE 1.07 1.09 1.15   < >  --    < > 1.97* 1.69* 1.50*  --  1.36* 1.18   < > = values in this interval not displayed.    Estimated Creatinine Clearance: 50.4 mL/min (by C-G formula based on SCr of 1.18 mg/dL).    No Known Allergies  Antimicrobials this admission: Zosyn 8/15 >>8/21 Unasyn 8/21 >>  Microbiology results: 8/18 bile: no organisms on gram stain, no growth x 3 days to date  Thank you for allowing pharmacy to be a part of this patient's care.  Arty Baumgartner, [place] 08/20/2022 10:27 AM

## 2022-08-20 NOTE — Assessment & Plan Note (Signed)
This occurred after placement of perc tube.  Resolved.  Anion gap and bicarb normal today.

## 2022-08-20 NOTE — Hospital Course (Addendum)
82 y.o. M with HTN, prosCA, DM, and heart block (recently admitted for same due to severe hypokalemia) who presented with 3 days RUQ post-prandial painIn the ER, CT suggested acute cholecystitis.  Had leukocytosis.Gen Surg consulted.8/16: Cardiology consulted for recurrent heart block.  S/p percutaneous cholecystostomy tube placed. But on 8/19: Patient developed DKA, placed on insulin drip- Transitioned back to subQ insulin 8/20.8/21-22: Nausea and inability to take PO, sucralfate/PPI started.  Patient has been managed with IV Unasyn. For DM on Insulin 30 units daily and SSI, for HTN home lisinopril aspirin amlodipine. Blood sugar dropped to 37 8/24-insulin dose changed.  There was concern about dysphagia odynophagia GI was consulted-symptoms has improved with PPI and sucralfate no need for further work-up and signed off.  Can follow-up with outpatient GI.  Patient's daughter has been educated on drain care.  Patient completed 10 days of IV antibiotics.Planning for home health PT. surgery will be seeing him in 6 to 8 weeks to discuss about interval cholecystectomy.  Patient hospitalized and prolonged for few more days due to his poor oral intake seen by dietitian added nutritional supplement, insulin was adjusted and back on 7030 regimen blood sugar overall stable.  PT OT suggested skilled nursing facility for his deconditioning today, I discussed with the daughter in detail patient wants to go home does not want to go to skilled nursing facility and daughter wants to take him home Home health will be set up.  Of note he is at high risk for readmission, I have asked for a quick follow-up with PCP within a week-asked RN to see if he it can be set up from hospital, also informed the daughter to call the  PCP office.

## 2022-08-20 NOTE — Assessment & Plan Note (Signed)
Nursing report heart rate >150 today, reviewed telemetry, no tachycardia noted.  Patient has low amplitude QRS and prominent P wave and the combination is being misidentified by the telemetry system as tachycardia. - Avoid nodal agents - Follow up with Cardiology outpatient

## 2022-08-20 NOTE — Progress Notes (Signed)
  Progress Note   Patient: Andre Stout [id number]:[id number] DOB: 1940-01-08 DOA: 08/14/2022     6 DOS: the patient was seen and examined on 08/20/2022 at 1:15PM      Brief hospital course: Andre Stout is an 82 y.o. M with HTN, prosCA, DM, and heart block (recently admitted for same due to severe hypokalemia) who presented with 3 days RUQ post-prandial pain.  In the ER, CT suggested acute cholecystitis.  Had leukocytosis.   8/15: Admitted on antibiotics, Gen Surg consulted 8/16: Cardiology consulted for recurrent heart block 8/18: Percutaneous cholecystostomy tube placed 8/19: Patient developed DKA, placed on insulin drip 8/20: Transitioned back to subQ insulin     Assessment and Plan: * Acute cholecystitis Still unable to tolerate any solid food due to nausea - Continue antibiotics, narrow to Unasyn - Flush drain - ADAT  Heart block AV complete (HCC) - Avoid nodal agents - Follow up with Cardiology outpatient  DKA, type 2 ([place]) Resolved - Continue subcutaneous insulin  Myocardial injury No chest pain, ischemia ruled out.  Obesity (BMI 30-39.9) BMI 32  Hyponatremia Euvolemic, asymptomatic  Acute hypokalemia - Supplement K  Diabetes mellitus without complication (HCC) - Continue semglee - Continue SS corrections - Continue aspirin  Benign essential HTN BP normal - Continue PRN hydralazine  AKI (acute kidney injury) ([place]) Resolved to baseline          Subjective: Still with severe nausea, no confusion, fever, vomiting.  No chest pain.  No dyspnea.     Physical Exam: Vitals:   08/19/22 2024 08/20/22 0011 08/20/22 0441 08/20/22 0555  BP: (!) 155/75 122/61 (!) 156/85 (!) 143/83  Pulse: 71 84 87 87  Resp: '20 18 18 19  '$ Temp: 97.9 F (36.6 C) 98.7 F (37.1 C) 98.3 F (36.8 C) 98.3 F (36.8 C)  TempSrc: Oral Oral Oral Oral  SpO2: 98% 95% 97% 99%  Weight:      Height:       Obese adult male, lying in bed, appears uncomfortable, full tray of  food in front of him, he has had only a few sips of Ensure RRR, no murmurs, no peripheral edema Respiratory rate normal, lungs clear without rales or wheezes Abdomen soft without tenderness palpation or guarding in all quadrants Percutaneous drain in the right abdomen attention normal, affect pain due to nausea, speech fluent, face symmetric, moves all extremities with generalized weakness but symmetric strength    Data Reviewed: Surgery notes reviewed Basic metabolic panel shows sodium resolved Potassium 3.1 Creatinine down to 1.1 Urinalysis with glucose and protein, no white blood cells or bacteria Echocardiogram shows normal EF, grade 1 diastolic dysfunction Albumin 2.5 White blood cells yesterday up to 20 K B hydroxybutyrate resolved Bile culture no growth         Disposition: Status is: Inpatient The patient was admitted with acute calculus cholecystitis, general surgery recommended percutaneous drain and nonoperative treatment for now with antibiotics and outpatient follow-up  At present he is not able to tolerate any solid food, so we will continue to advance diet as tolerated and continue antibiotics IV        Author: Edwin Dada, MD 08/20/2022 3:33 PM  For on call review [email or web address].

## 2022-08-20 NOTE — Assessment & Plan Note (Signed)
No chest pain, ischemia ruled out.

## 2022-08-20 NOTE — Assessment & Plan Note (Signed)
Still unable to tolerate any solid food due to nausea - Continue antibiotics, narrow to Unasyn - Flush drain - ADAT

## 2022-08-20 NOTE — Progress Notes (Signed)
Subjective: CC: Nausea still present but improving - no vomiting in 48 hours. Tolerating liquids and also ate a few bites of solids this morning - eggs and sausage, 1/2 container of milk. Abd pain improving but still reports discomfort described as "reflux" after meals.  Objective: Vital signs in last 24 hours: Temp:  [97.9 F (36.6 C)-98.7 F (37.1 C)] 98.3 F (36.8 C) (08/21 0555) Pulse Rate:  [71-87] 87 (08/21 0555) Resp:  [18-20] 19 (08/21 0555) BP: (122-156)/(61-85) 143/83 (08/21 0555) SpO2:  [95 %-99 %] 99 % (08/21 0555) Last BM Date : 08/19/22  Intake/Output from previous day: 08/20 0701 - 08/21 0700 In: 2242.9 [I.V.:2108.9; IV Piggyback:124] Out: 805 [Urine:775; Drains:30] Intake/Output this shift: No intake/output data recorded.  PE: Gen:  Alert, NAD, pleasant Abd: Soft, mild distention, NT, +BS, IR drain with bilious fluid in bag  Lab Results:  Recent Labs    08/18/22 0533 08/19/22 1248  WBC 16.9* 20.8*  HGB 14.3 13.4  HCT 43.9 40.0  PLT 449* 427*   BMET Recent Labs    08/19/22 1622 08/20/22 0726  NA 134* 138  K 3.4* 3.1*  CL 105 108  CO2 20* 23  GLUCOSE 285* 190*  BUN 14 10  CREATININE 1.36* 1.18  CALCIUM 8.6* 8.3*   PT/INR No results for input(s): "LABPROT", "INR" in the last 72 hours. CMP     Component Value Date/Time   NA 138 08/20/2022 0726   K 3.1 (L) 08/20/2022 0726   CL 108 08/20/2022 0726   CO2 23 08/20/2022 0726   GLUCOSE 190 (H) 08/20/2022 0726   BUN 10 08/20/2022 0726   CREATININE 1.18 08/20/2022 0726   CREATININE 1.11 01/12/2022 1541   CALCIUM 8.3 (L) 08/20/2022 0726   PROT 5.6 (L) 08/20/2022 0726   ALBUMIN 2.2 (L) 08/20/2022 0726   AST 14 (L) 08/20/2022 0726   ALT 9 08/20/2022 0726   ALKPHOS 63 08/20/2022 0726   BILITOT 0.8 08/20/2022 0726   GFRNONAA >60 08/20/2022 0726   GFRAA >60 03/07/2016 0337   Lipase     Component Value Date/Time   LIPASE 22 03/04/2016 1149    Studies/Results: No results  found.  Anti-infectives: Anti-infectives (From admission, onward)    Start     Dose/Rate Route Frequency Ordered Stop   08/14/22 2000  piperacillin-tazobactam (ZOSYN) IVPB 3.375 g        3.375 g 12.5 mL/hr over 240 Minutes Intravenous Every 8 hours 08/14/22 1516     08/14/22 1145  piperacillin-tazobactam (ZOSYN) IVPB 3.375 g        3.375 g 12.5 mL/hr over 240 Minutes Intravenous  Once 08/14/22 1138 08/14/22 1632        Assessment/Plan Calculous cholecystitis - S/p Perc chole 8/18. Cx's NGTD. Cont abx. Would recommend 10d abx total - It appears nausea has been his main symptom. Never significant pain. After Perc Drain his nausea is now improving. Continue carb mod diet and encourage frequent small meals (6+ snacks rather than 3 large meals daily). Add ensure BID BM, if patient tolerates shakes better than solids then encourage high protein snacks/liquids. Repeat CBC in AM. -cholecystitis managed with a perc drain for now, continue cards follow up/work up for complete heart block and then we will see him in 6-8 weeks and see where things stand and if he is safe at that point to pursue interval cholecystectomy. - No acute surgical needs. Follow up in our office arranged.   FEN - HH  VTE - lovenox ID - [email or web address]   Complete heart block HTN DM H/o prostate cancer   LOS: 6 days    Jill Alexanders , [facility] Surgery 08/20/2022, 9:50 AM Please see Amion for pager number during day hours 7:00am-4:30pm

## 2022-08-21 DIAGNOSIS — K810 Acute cholecystitis: Secondary | ICD-10-CM | POA: Diagnosis not present

## 2022-08-21 DIAGNOSIS — I10 Essential (primary) hypertension: Secondary | ICD-10-CM | POA: Diagnosis not present

## 2022-08-21 DIAGNOSIS — R1310 Dysphagia, unspecified: Secondary | ICD-10-CM

## 2022-08-21 DIAGNOSIS — N179 Acute kidney failure, unspecified: Secondary | ICD-10-CM | POA: Diagnosis not present

## 2022-08-21 DIAGNOSIS — E876 Hypokalemia: Secondary | ICD-10-CM | POA: Diagnosis not present

## 2022-08-21 LAB — COMPREHENSIVE METABOLIC PANEL
ALT: 8 U/L (ref 0–44)
AST: 15 U/L (ref 15–41)
Albumin: 2.2 g/dL — ABNORMAL LOW (ref 3.5–5.0)
Alkaline Phosphatase: 56 U/L (ref 38–126)
Anion gap: 9 (ref 5–15)
BUN: 7 mg/dL — ABNORMAL LOW (ref 8–23)
CO2: 22 mmol/L (ref 22–32)
Calcium: 8.5 mg/dL — ABNORMAL LOW (ref 8.9–10.3)
Chloride: 110 mmol/L (ref 98–111)
Creatinine, Ser: 1.1 mg/dL (ref 0.61–1.24)
GFR, Estimated: >60 mL/min (ref >60)
Glucose, Bld: 86 mg/dL (ref 70–99)
Potassium: 3.2 mmol/L — ABNORMAL LOW (ref 3.5–5.1)
Sodium: 141 mmol/L (ref 135–145)
Total Bilirubin: 0.8 mg/dL (ref 0.3–1.2)
Total Protein: 5.5 g/dL — ABNORMAL LOW (ref 6.5–8.1)

## 2022-08-21 LAB — CBC
HCT: 37.5 % — ABNORMAL LOW (ref 39.0–52.0)
Hemoglobin: 12.3 g/dL — ABNORMAL LOW (ref 13.0–17.0)
MCH: 27 pg (ref 26.0–34.0)
MCHC: 32.8 g/dL (ref 30.0–36.0)
MCV: 82.2 fL (ref 80.0–100.0)
Platelets: 438 10*3/uL — ABNORMAL HIGH (ref 150–400)
RBC: 4.56 MIL/uL (ref 4.22–5.81)
RDW: 14.8 % (ref 11.5–15.5)
WBC: 10.9 10*3/uL — ABNORMAL HIGH (ref 4.0–10.5)
nRBC: 0 % (ref 0.0–0.2)

## 2022-08-21 LAB — GLUCOSE, CAPILLARY
Glucose-Capillary: 98 mg/dL (ref 70–99)
Glucose-Capillary: 162 mg/dL — ABNORMAL HIGH (ref 70–99)
Glucose-Capillary: 292 mg/dL — ABNORMAL HIGH (ref 70–99)
Glucose-Capillary: 223 mg/dL — ABNORMAL HIGH (ref 70–99)

## 2022-08-21 MED ORDER — SUCRALFATE 1 GM/10ML PO SUSP
1.0000 g | Freq: Three times a day (TID) | ORAL | Status: DC
  Administered 2022-08-21 – 2022-08-28 (×27): 1 g via ORAL
  Filled 2022-08-21 (×27): qty 10

## 2022-08-21 MED ORDER — POTASSIUM CHLORIDE 20 MEQ PO PACK
60.0000 meq | PACK | Freq: Two times a day (BID) | ORAL | Status: DC
Start: 2022-08-21 — End: 2022-08-21
  Administered 2022-08-21: 60 meq via ORAL
  Filled 2022-08-21: qty 3

## 2022-08-21 MED ORDER — LISINOPRIL 20 MG PO TABS
20.0000 mg | ORAL_TABLET | Freq: Every day | ORAL | Status: DC
  Administered 2022-08-21 – 2022-08-28 (×8): 20 mg via ORAL
  Filled 2022-08-21 (×8): qty 1

## 2022-08-21 MED ORDER — AMLODIPINE BESYLATE 10 MG PO TABS
10.0000 mg | ORAL_TABLET | Freq: Every day | ORAL | Status: DC
  Administered 2022-08-21 – 2022-08-28 (×8): 10 mg via ORAL
  Filled 2022-08-21 (×8): qty 1

## 2022-08-21 MED ORDER — POTASSIUM CHLORIDE 10 MEQ/100ML IV SOLN
10.0000 meq | INTRAVENOUS | Status: AC
  Administered 2022-08-21 (×6): 10 meq via INTRAVENOUS
  Filled 2022-08-21 (×6): qty 100

## 2022-08-21 MED ORDER — PANTOPRAZOLE SODIUM 40 MG PO TBEC
40.0000 mg | DELAYED_RELEASE_TABLET | Freq: Two times a day (BID) | ORAL | Status: DC
  Administered 2022-08-21 – 2022-08-28 (×13): 40 mg via ORAL
  Filled 2022-08-21 (×14): qty 1

## 2022-08-21 MED ORDER — POTASSIUM CHLORIDE IN NACL 20-0.9 MEQ/L-% IV SOLN
INTRAVENOUS | Status: DC
  Filled 2022-08-21 (×3): qty 1000

## 2022-08-21 NOTE — Progress Notes (Signed)
   08/21/22 1600  Mobility  Activity Ambulated with assistance in hallway  Level of Assistance Minimal assist, patient does 75% or more  Assistive Device Front wheel walker  Distance Ambulated (ft) 230 ft  Activity Response Tolerated well  $Mobility charge 1 Mobility   Mobility Specialist Progress Note  Received pt in chair having no complaints and agreeable to mobility. Pt was asymptomatic throughout ambulation and returned to room w/o fault. Left in chair w/ call bell in reach and all needs met.   Lucious Groves Mobility Specialist

## 2022-08-21 NOTE — Assessment & Plan Note (Signed)
Patient now reports pain with swallowing, epigastric pain after swallowing.  No regurgitation.  Suspect esophagitis, less likely candidiasis. - Start PPI and sucralfate - Discussed with EP, if done in an inpatient setting, the patient's heart block should not preclude moderate sedation for endoscopy or general anesthesia for laparoscopy/laparotomy  - If no improvement tomorrow with PPI, sucralfate, will consult [facility]

## 2022-08-21 NOTE — Progress Notes (Signed)
Progress Note   Patient: Andre Stout ZOX:[id number] DOB: 09-14-1940 DOA: 08/14/2022     7 DOS: the patient was seen and examined on 08/21/2022        Brief hospital course: Andre Stout is an 82 y.o. M with HTN, prosCA, DM, and heart block (recently admitted for same due to severe hypokalemia) who presented with 3 days RUQ post-prandial pain.  In the ER, CT suggested acute cholecystitis.  Had leukocytosis.   8/15: Admitted on antibiotics, Gen Surg consulted 8/16: Cardiology consulted for recurrent heart block 8/18: Percutaneous cholecystostomy tube placed 8/19: Patient developed DKA, placed on insulin drip 8/20: Transitioned back to subQ insulin 8/21-22: Nausea and inability to take PO, sucralfate/PPI started     Assessment and Plan: * Acute cholecystitis Admitted with abdominal discomfort, nausea, leukocytosis and imaging showing cholecystitis.  Gen surg consulted and recommended nonoperative management due to heart block.  Had perc drain placed, but subsequently developed pain with swallowing, and epigastric pain and severe nausea.  Unable to take any solid food or even a few sips of ensure over the last 2 days.  No improvement today. - Continue Unasyn - Flush drain q8 while in the hospital - ADAT - Consult Gen Surg and IR, appreciate cares    Odynophagia Patient now reports pain with swallowing, epigastric pain after swallowing.  No regurgitation.  Suspect esophagitis, less likely candidiasis. - Start PPI and sucralfate - Discussed with EP, if done in an inpatient setting, the patient's heart block should not preclude moderate sedation for endoscopy or general anesthesia for laparoscopy/laparotomy  - If no improvement tomorrow with PPI, sucralfate, will consult [facility]  Heart block AV complete ([place]) Nursing report heart rate >150 today, reviewed telemetry, no tachycardia noted.  Patient has low amplitude QRS and prominent P wave and the combination is being  misidentified by the telemetry system as tachycardia. - Avoid nodal agents - Follow up with Cardiology outpatient  Myocardial injury No chest pain, ischemia ruled out.  Obesity (BMI 30-39.9) BMI 32  Hyponatremia Euvolemic, asymptomatic  DKA, type 2 ([place]) This occurred after placement of perc tube.  Resolved.  Anion gap and bicarb normal today.  Acute hypokalemia - Supplement K IV  Diabetes mellitus without complication (HCC) - Continue semglee - Continue SS corrections - Continue aspirin  Benign essential HTN BP controlled - Stop hydralazine - Start back home amlodipine, lisinopril  AKI (acute kidney injury) ([place]) Resolved to baseline          Subjective: Patient still very nauseated, still has pain with anytime he swallows, still has "aching all over", generalized malaise.  No fever, no respiratory distress, no right-sided abdominal pain, no bleeding.     Physical Exam: Vitals:   08/20/22 1937 08/21/22 0359 08/21/22 0845 08/21/22 1000  BP: (!) 156/92 (!) 161/88 (!) 145/85 (!) 154/91  Pulse: 85 83  86  Resp: 16 20 (!) 22 13  Temp: 98.7 F (37.1 C) 98.3 F (36.8 C)    TempSrc: Oral Oral    SpO2: 94% 97%  96%  Weight:  87.5 kg    Height:       Obese adult male, lying in bed, appears uncomfortable RRR, no murmurs, no peripheral Respiratory rate normal, lungs clear without rales or wheezes Abdomen soft without tenderness palpation or guarding Percutaneous drain in place, scant green fluid in the tube Attention normal, affect blunted by nausea, judgment and insight appear normal   Data Reviewed: Discussed with EP, general surgery, GI Labs notable  for white count down to 10.9, creatinine and LFTs normal, potassium unchanged.  Glucose is normal.  Family Communication: Wife at the bedside    Disposition: Status is: Inpatient The patient presented with acute cholecystitis..  Drain placed, and afterwards stay was complicated by DKA which is now  resolved.  Unfortunately the patient still has inability to take anything by mouth.  I suspect this is some esophagitis and so we will start PPI and sucralfate today, start hydralazine, and monitor tomorrow.  If he is able to take food by mouth, likely home tomorrow, otherwise we will consult GI        Author: Edwin Dada, MD 08/21/2022 4:27 PM  For on call review [email or web address].

## 2022-08-21 NOTE — Progress Notes (Addendum)
Subjective: CC: NAEO. Still not eating well and reports epigastric pain with swallowing   Objective: Vital signs in last 24 hours: Temp:  [98.3 F (36.8 C)-98.7 F (37.1 C)] 98.3 F (36.8 C) (08/22 0359) Pulse Rate:  [83-85] 83 (08/22 0359) Resp:  [16-22] 22 (08/22 0845) BP: (145-161)/(85-92) 145/85 (08/22 0845) SpO2:  [94 %-97 %] 97 % (08/22 0359) Weight:  [87.5 kg] 87.5 kg (08/22 0359) Last BM Date : 08/20/22  Intake/Output from previous day: 08/21 0701 - 08/22 0700 In: 300 [P.O.:200; IV Piggyback:100] Out: 850 [Urine:750] Intake/Output this shift: No intake/output data recorded.  PE: Gen:  Alert, NAD, pleasant Abd: Soft, mild distention, NT, +BS, IR drain with bilious fluid in bag  Lab Results:  Recent Labs    08/19/22 1248 08/21/22 0429  WBC 20.8* 10.9*  HGB 13.4 12.3*  HCT 40.0 37.5*  PLT 427* 438*   BMET Recent Labs    08/20/22 0726 08/21/22 0429  NA 138 141  K 3.1* 3.2*  CL 108 110  CO2 23 22  GLUCOSE 190* 86  BUN 10 7*  CREATININE 1.18 1.10  CALCIUM 8.3* 8.5*   PT/INR No results for input(s): "LABPROT", "INR" in the last 72 hours. CMP     Component Value Date/Time   NA 141 08/21/2022 0429   K 3.2 (L) 08/21/2022 0429   CL 110 08/21/2022 0429   CO2 22 08/21/2022 0429   GLUCOSE 86 08/21/2022 0429   BUN 7 (L) 08/21/2022 0429   CREATININE 1.10 08/21/2022 0429   CREATININE 1.11 01/12/2022 1541   CALCIUM 8.5 (L) 08/21/2022 0429   PROT 5.5 (L) 08/21/2022 0429   ALBUMIN 2.2 (L) 08/21/2022 0429   AST 15 08/21/2022 0429   ALT 8 08/21/2022 0429   ALKPHOS 56 08/21/2022 0429   BILITOT 0.8 08/21/2022 0429   GFRNONAA >60 08/21/2022 0429   GFRAA >60 03/07/2016 0337   Lipase     Component Value Date/Time   LIPASE 22 03/04/2016 1149    Studies/Results: No results found.  Anti-infectives: Anti-infectives (From admission, onward)    Start     Dose/Rate Route Frequency Ordered Stop   08/20/22 1200  Ampicillin-Sulbactam (UNASYN) 3 g  in sodium chloride 0.9 % 100 mL IVPB        3 g 200 mL/hr over 30 Minutes Intravenous Every 8 hours 08/20/22 1023     08/14/22 2000  piperacillin-tazobactam (ZOSYN) IVPB 3.375 g  Status:  Discontinued        3.375 g 12.5 mL/hr over 240 Minutes Intravenous Every 8 hours 08/14/22 1516 08/20/22 0959   08/14/22 1145  piperacillin-tazobactam (ZOSYN) IVPB 3.375 g        3.375 g 12.5 mL/hr over 240 Minutes Intravenous  Once 08/14/22 1138 08/14/22 1632        Assessment/Plan Calculous cholecystitis - S/p Perc chole 8/18. Cx's NGTD. Cont abx. Would recommend 10d abx total - It appears nausea has been his main symptom. Never significant pain. After Perc Drain his nausea and epigastric pain with swallowing persist. Nontender on exam. Continue carb mod diet and encourage frequent small meals (6+ snacks rather than 3 large meals daily). Continue ensure.   - Discussed patient case with Dr. [name] this AM, his perc chole drain is functioning and his WBC is down-trending after procedure. Given his persistent nausea, pain with swallowing, thickening of distal esophagus on CT 8/15, may be reasonable to ask GI to evaluate him. It is possible some of these  sxs are unrelated to the history of gallbladder disease.   -cholecystitis managed with a perc drain for now, continue cards follow up/work up for complete heart block and then we will see him in 6-8 weeks and see where things stand and if he is safe at that point to pursue interval cholecystectomy. - No acute surgical needs. Follow up in our office arranged.   FEN - HH VTE - lovenox ID - [email or web address]   Complete heart block HTN DM H/o prostate cancer   LOS: 7 days    Jill Alexanders , [facility] 08/21/2022, 11:53 AM Please see Amion for pager number during day hours 7:00am-4:30pm

## 2022-08-21 NOTE — Progress Notes (Signed)
Asked for clarification for pre-op clearance.   Pt is OK to proceed with any necessary operative procedures despite his CHB, given that he has a stable, narrow junctional escape.   Any procedures requiring sedation should be done in the Hospital setting with appropriate back up readily available, but otherwise, he may proceed.   Clarification of Dr. Tanna Furry note from 08/18/2022  Beryle Beams" [place], PA-C  08/21/2022 11:44 AM

## 2022-08-21 NOTE — Progress Notes (Signed)
Pt not able to eat or drink his morning meal.  Pt stated feeling nauseated and not able to tolerate anything by mouth. Prn med given. [place] MD aware, [place] PA aware.

## 2022-08-21 NOTE — Progress Notes (Signed)
Mobility Specialist Progress Note:   08/21/22 1100  Mobility  Activity Refused mobility   Pt in bed feeling nauseous asking for [place] to come back later. Will follow-up as time allows.   [facility] Alexandru Moorer Mobility Specialist'

## 2022-08-21 NOTE — Progress Notes (Signed)
Supervising Physician: Corrie Mckusick  Patient Status:  [facility] - In-pt  Chief Complaint: S/p chole tube placement on 08/17/22 with Dr. Serafina Royals  Subjective:  Still with abdominal pain and nausea Eating a little better today.  Allergies: Patient has no known allergies.  Medications: Prior to Admission medications   Medication Sig Start Date End Date Taking? Authorizing Provider  acetaminophen (TYLENOL) 500 MG tablet Take 1,000 mg by mouth daily as needed (pain).   Yes [provider]  aspirin 81 MG chewable tablet Chew 1 tablet (81 mg total) by mouth daily. Patient taking differently: Chew 81 mg by mouth at bedtime. 01/06/22  Yes Swayze, Ava, DO  brimonidine (ALPHAGAN) 0.2 % ophthalmic solution Place 1 drop into both eyes 2 (two) times daily with breakfast and lunch. 12/18/21  Yes [provider]  cetirizine (ZYRTEC) 10 MG tablet Take 10 mg by mouth daily as needed (seasonal allergies).   Yes [provider]  insulin NPH-regular Human (70-30) 100 UNIT/ML injection Inject 40 Units into the skin 2 (two) times daily with a meal. Patient taking differently: Inject 36 Units into the skin 2 (two) times daily with a meal. 08/04/22  Yes Pahwani, Ravi, MD  latanoprost (XALATAN) 0.005 % ophthalmic solution Place 1 drop into both eyes at bedtime. 01/01/22  Yes [provider]  magnesium oxide (MAG-OX) 400 (240 Mg) MG tablet Take 400 mg by mouth at bedtime. 06/06/22  Yes [provider]  Multiple Vitamins-Minerals (PRESERVISION AREDS 2) CAPS Take 1 capsule by mouth at bedtime.   Yes [provider]  amLODipine (NORVASC) 10 MG tablet Take 1 tablet (10 mg total) by mouth daily. Patient not taking: Reported on 08/14/2022 08/05/22 09/04/22  Darliss Cheney, MD  BD INSULIN SYRINGE U/F 31G X 5/16" 1 ML MISC USE 2 DAILY 08/24/21   Renato Shin, MD  dorzolamide-timolol (COSOPT) 22.3-6.8 MG/ML ophthalmic solution Place 1 drop into both eyes 2 (two) times daily with  breakfast and lunch. 05/18/22   [provider]  lisinopril (ZESTRIL) 20 MG tablet Take 1 tablet (20 mg total) by mouth daily. Patient not taking: Reported on 08/14/2022 08/05/22 09/04/22  Darliss Cheney, MD  [facility] DELICA LANCETS 31D MISC Use to check blood sugar 4 times per day. Dx code: E11.9 03/27/16   Renato Shin, MD  [facility] ULTRA test strip USE TO MONITOR GLUCOSE LEVELS 4 TIMES PER DAY E11.9 06/10/19   Renato Shin, MD     Vital Signs: BP (!) 154/91   Pulse 86   Temp 98.3 F (36.8 C) (Oral)   Resp 13   Ht '5\' 5"'$  (1.651 m)   Wt 192 lb 14.4 oz (87.5 kg)   SpO2 96%   BMI 32.10 kg/m   Physical Exam Constitutional:      General: He is not in acute distress. HENT:     Head: Normocephalic and atraumatic.  Cardiovascular:     Rate and Rhythm: Normal rate.  Pulmonary:     Effort: Pulmonary effort is normal. No respiratory distress.  Abdominal:     General: Abdomen is flat.     Palpations: Abdomen is soft.     Tenderness: There is abdominal tenderness in the right upper quadrant.  Skin:    General: Skin is warm and dry.  Neurological:     General: No focal deficit present.     Mental Status: He is alert and oriented to person, place, and time.  Psychiatric:        Mood and Affect:  Mood normal.        Behavior: Behavior normal.   Drain Location: RUQ Size: Fr size: 10 Fr Date of placement: 08/17/22  Currently to: Drain collection device: gravity 24 hour output:  Output by Drain (mL) 08/19/22 0700 - 08/19/22 1459 08/19/22 1500 - 08/19/22 2259 08/19/22 2300 - 08/20/22 0659 08/20/22 0700 - 08/20/22 1459 08/20/22 1500 - 08/20/22 2259 08/20/22 2300 - 08/21/22 0659 08/21/22 0700 - 08/21/22 1459 08/21/22 1500 - 08/21/22 1602  Biliary Tube Cook slip-coat 10.2 Fr. RUQ   30        Current examination: Flushes/aspirates easily.  Insertion site unremarkable. Suture and stat lock in place. Dressed appropriately.    Imaging: No results found.  Labs:  CBC: Recent Labs     08/16/22 0524 08/18/22 0533 08/19/22 1248 08/21/22 0429  WBC 10.8* 16.9* 20.8* 10.9*  HGB 12.6* 14.3 13.4 12.3*  HCT 39.0 43.9 40.0 37.5*  PLT 327 449* 427* 438*    COAGS: Recent Labs    08/14/22 1200  INR 1.3*  APTT 31    BMP: Recent Labs    08/19/22 0128 08/19/22 1622 08/20/22 0726 08/21/22 0429  NA 133* 134* 138 141  K 3.5 3.4* 3.1* 3.2*  CL 104 105 108 110  CO2 18* 20* 23 22  GLUCOSE 127* 285* 190* 86  BUN '19 14 10 '$ 7*  CALCIUM 8.6* 8.6* 8.3* 8.5*  CREATININE 1.50* 1.36* 1.18 1.10  GFRNONAA 46* 52* >60 >60    LIVER FUNCTION TESTS: Recent Labs    08/18/22 0305 08/19/22 1248 08/20/22 0726 08/21/22 0429  BILITOT 2.3* 0.9 0.8 0.8  AST 10* 22 14* 15  ALT '10 11 9 8  '$ ALKPHOS 82 71 63 56  PROT 6.4* 6.3* 5.6* 5.5*  ALBUMIN 2.5* 2.5* 2.2* 2.2*    Assessment and Plan:  Acute cholecystitis -4 days s/p drain placement -minimal documentation on OP in chart.  Communicated with RN need for documentation.  Can consider drain injection if pain out of proportion to expectations and/or OP is minimal.   -WBC down to 10.9 (from 20 2 days ago), afebrile, not tachycardic.  All reassuring.  Plan: Continue TID flushes with 5 cc NS. Record output Q shift. Dressing changes QD or PRN if soiled.  Call IR APP or on call IR MD if difficulty flushing or sudden change in drain output.   Discharge planning: Please contact IR APP or on call IR MD prior to patient d/c to ensure appropriate follow up plans are in place. Typically patient will follow up with IR clinic 6-8 weeks post d/c for repeat imaging/drain injection. IR scheduler will contact patient with date/time of appointment. Patient will need to flush drain QD with 5 cc NS, record output QD, dressing changes every 2-3 days or earlier if soiled.   IR will continue to follow - please call with questions or concerns.  Electronically Signed: Pasty Spillers, PA 08/21/2022, 3:58 PM   I spent a total of 25 Minutes at the  the patient's bedside AND on the patient's hospital floor or unit, greater than 50% of which was counseling/coordinating care for drain management

## 2022-08-21 NOTE — Progress Notes (Signed)
Physical Therapy Treatment Patient Details Name: Andre Stout MRN: [id number] DOB: 1940/12/23 Today's Date: 08/21/2022   History of Present Illness Pt presented 8/15 with new onset of RUQ abdominal pain.  Underwent percutaneous cholecystostomy tube placement on 8/18. 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 Will need assistance with mobility at home. Daughter confirms that pt will have assist.    Recommendations for follow up therapy are one component of a multi-disciplinary discharge planning process, led by the attending physician.  Recommendations may be updated based on patient status, additional functional criteria and insurance authorization.  Follow Up Recommendations  Home health PT     Assistance Recommended at Discharge Frequent or constant Supervision/Assistance  Patient can return home with the following A little help with walking and/or transfers;A little help with bathing/dressing/bathroom;Assistance with cooking/housework;Help with stairs or ramp for entrance;Assist for transportation;Direct supervision/assist for financial management;Direct supervision/assist for medications management   Equipment Recommendations  None recommended by PT    Recommendations for Other Services       Precautions / Restrictions Precautions Precautions: Fall Precaution Comments: R cholecystostomy drain Restrictions Weight Bearing Restrictions: No     Mobility  Bed Mobility Overal bed mobility: Needs Assistance Bed Mobility: Supine to Sit     Supine to sit: Min assist     General bed mobility comments: Assist to elevate trunk into sitting    Transfers Overall transfer level: Needs assistance Equipment used: Rolling walker (2 wheels) Transfers: Sit to/from Stand Sit to Stand: Min assist           General transfer comment: Assist to bring hips up  and for balance. Verbal cues for hand placement.    Ambulation/Gait Ambulation/Gait assistance: Min assist Gait Distance (Feet): 90 Feet Assistive device: Rolling walker (2 wheels) Gait Pattern/deviations: Step-through pattern, Decreased step length - right, Decreased step length - left, Trunk flexed Gait velocity: decr Gait velocity interpretation: <1.31 ft/sec, indicative of household ambulator   General Gait Details: Assist for balance and support. Verbal cues to stay closer to walker   Stairs             Wheelchair Mobility    Modified Rankin (Stroke Patients Only)       Balance Overall balance assessment: Needs assistance Sitting-balance support: No upper extremity supported Sitting balance-Leahy Scale: Fair     Standing balance support: Bilateral upper extremity supported Standing balance-Leahy Scale: Poor Standing balance comment: walker and min guard for static standing                            Cognition Arousal/Alertness: Awake/alert Behavior During Therapy: WFL for tasks assessed/performed                     Orientation Level: Disoriented to, Time (able to state "hospital in [place]") Current Attention Level: Selective Memory: Decreased short-term memory Following Commands: Follows one step commands with increased time, Follows one step commands consistently Safety/Judgement: Decreased awareness of safety   Problem Solving: Slow processing, Requires verbal cues          Exercises General Exercises - Lower Extremity Long Arc Quad: AROM, Both, 10 reps, Seated    General Comments        Pertinent Vitals/Pain Pain Assessment Pain Assessment: Faces Faces Pain Scale: Hurts a little bit Pain Location: abdomen Pain Descriptors /  Indicators: Grimacing, Guarding Pain Intervention(s): Limited activity within patient's tolerance    Home Living                          Prior Function            PT Goals  (current goals can now be found in the care plan section) Progress towards PT goals: Progressing toward goals    Frequency    Min 3X/week      PT Plan Current plan remains appropriate    Co-evaluation              AM-PAC PT "6 Clicks" Mobility   Outcome Measure  Help needed turning from your back to your side while in a flat bed without using bedrails?: A Little Help needed moving from lying on your back to sitting on the side of a flat bed without using bedrails?: A Little Help needed moving to and from a bed to a chair (including a wheelchair)?: A Little Help needed standing up from a chair using your arms (e.g., wheelchair or bedside chair)?: A Little Help needed to walk in hospital room?: A Little Help needed climbing 3-5 steps with a railing? : Total 6 Click Score: 16    End of Session   Activity Tolerance: Patient limited by fatigue Patient left: in chair;with call bell/phone within reach   PT Visit Diagnosis: Other abnormalities of gait and mobility (R26.89);Muscle weakness (generalized) (M62.81)     Time: 9833-8250 PT Time Calculation (min) (ACUTE ONLY): 30 min  Charges:  $Gait Training: 23-37 mins                     [place] [facility] [place] 08/21/2022, 3:33 PM

## 2022-08-22 DIAGNOSIS — K810 Acute cholecystitis: Secondary | ICD-10-CM | POA: Diagnosis not present

## 2022-08-22 LAB — CBC
HCT: 37.5 % — ABNORMAL LOW (ref 39.0–52.0)
Hemoglobin: 12.3 g/dL — ABNORMAL LOW (ref 13.0–17.0)
MCH: 27.3 pg (ref 26.0–34.0)
MCHC: 32.8 g/dL (ref 30.0–36.0)
MCV: 83.3 fL (ref 80.0–100.0)
Platelets: 386 10*3/uL (ref 150–400)
RBC: 4.5 MIL/uL (ref 4.22–5.81)
RDW: 14.8 % (ref 11.5–15.5)
WBC: 11.3 10*3/uL — ABNORMAL HIGH (ref 4.0–10.5)
nRBC: 0 % (ref 0.0–0.2)

## 2022-08-22 LAB — GLUCOSE, CAPILLARY
Glucose-Capillary: 94 mg/dL (ref 70–99)
Glucose-Capillary: 125 mg/dL — ABNORMAL HIGH (ref 70–99)
Glucose-Capillary: 98 mg/dL (ref 70–99)
Glucose-Capillary: 84 mg/dL (ref 70–99)

## 2022-08-22 LAB — COMPREHENSIVE METABOLIC PANEL
ALT: 12 U/L (ref 0–44)
AST: 27 U/L (ref 15–41)
Albumin: 2.3 g/dL — ABNORMAL LOW (ref 3.5–5.0)
Alkaline Phosphatase: 52 U/L (ref 38–126)
Anion gap: 6 (ref 5–15)
BUN: 7 mg/dL — ABNORMAL LOW (ref 8–23)
CO2: 22 mmol/L (ref 22–32)
Calcium: 8.7 mg/dL — ABNORMAL LOW (ref 8.9–10.3)
Chloride: 110 mmol/L (ref 98–111)
Creatinine, Ser: 1.02 mg/dL (ref 0.61–1.24)
GFR, Estimated: >60 mL/min (ref >60)
Glucose, Bld: 98 mg/dL (ref 70–99)
Potassium: 4 mmol/L (ref 3.5–5.1)
Sodium: 138 mmol/L (ref 135–145)
Total Bilirubin: 1 mg/dL (ref 0.3–1.2)
Total Protein: 5.6 g/dL — ABNORMAL LOW (ref 6.5–8.1)

## 2022-08-22 LAB — AEROBIC/ANAEROBIC CULTURE W GRAM STAIN (SURGICAL/DEEP WOUND)
Culture: NO GROWTH
Gram Stain: NONE SEEN

## 2022-08-22 NOTE — Progress Notes (Signed)
PROGRESS NOTE Andre Stout  WUJ:[id number] DOB: 1940/04/20 DOA: 08/14/2022 PCP: [name], FNP   Brief Narrative/Hospital Course: 82 y.o. M with HTN, prosCA, DM, and heart block (recently admitted for same due to severe hypokalemia) who presented with 3 days RUQ post-prandial painIn the ER, CT suggested acute cholecystitis.  Had leukocytosis.Gen Surg consulted.8/16: Cardiology consulted for recurrent heart block.  S/p percutaneous cholecystostomy tube placed. But on 8/19: Patient developed DKA, placed on insulin drip- Transitioned back to subQ insulin 8/20.8/21-22: Nausea and inability to take PO, sucralfate/PPI started.  Patient has been managed with IV Unasyn. For DM on Insulin 30 units daily and SSI, for HTN home lisinopril aspirin amlodipine.    Subjective: Seen and examined.  Complains of having nausea but no abdominal pain fever chills.  Assessment and Plan: Principal Problem:   Acute cholecystitis Active Problems:   Heart block AV complete (HCC)   Odynophagia   AKI (acute kidney injury) ([place])   Benign essential HTN   Diabetes mellitus without complication (HCC)   Acute hypokalemia   DKA, type 2 (HCC)   Hyponatremia   Obesity (BMI 30-39.9)   Myocardial injury   Assessment and Plan:  Acute cholecystitis:S/p percutaneous cholecystostomy tube placed.  Continue Unasyn, flushed incubator while inpatient, advance diet as tolerated.  Appreciate general surgery and IR follow-up.  Having nausea today, will watch 1 more day.  Overall WBC count has improved Recent Labs  Lab 08/16/22 0524 08/18/22 0533 08/19/22 1248 08/21/22 0429 08/22/22 0510  WBC 10.8* 16.9* 20.8* 10.9* 11.3*     Odynophagia:Suspect esophagitis, less likely candidiasis.  Started  on PPI and sucralfate.. Dr [name] discussed with EP, if done in an inpatient setting, the patient's heart block should not preclude moderate sedation for endoscopy or general anesthesia for laparoscopy/laparotomy. If no  improvement  wth PPI, sucralfate, consult [facility]  Complete heart block:Patient has low amplitude QRS and prominent P wave and the combination is being misidentified by the telemetry system as tachycardia.Marland Kitchen avoid nodal agents, f/u with cardiology outpatient  Myocardial injury:No chest pain, ischemia ruled out.  Class I Obesity w/ BMI 33: Will benefit with weight loss, PCP follow-up  Hyponatremia: Euvolemic and asymptomatic  DKA, type 2 Diabetes mellitus with uncontrolled hyperglycemia :DKA occurred after placement of perc tube. DKA resolved. Well-controlled Semglee 30 units daily and SSI Recent Labs  Lab 08/21/22 0620 08/21/22 1106 08/21/22 1611 08/21/22 2123 08/22/22 0621  GLUCAP 98 162* 292* 223* 94   Hypokalemia resolved Essential hypertension BP controlled on home amlodipine lisinopril, off hydralazine AKI- resolved  DVT prophylaxis: enoxaparin (LOVENOX) injection 40 mg Start: 08/20/22 1800 Code Status:   Code Status: Full Code Family Communication: plan of care discussed with patient at bedside. Patient status is: Inpatient because of ongoing post cholecystectomy percutaneous drain management Level of care: Telemetry Cardiac   Dispo: The patient is from: home w/ grandson            Anticipated disposition: home in 1-2 days  Mobility Assessment (last 72 hours)     Mobility Assessment     Row Name 08/22/22 [phone] 08/21/22 1941 08/21/22 1527 08/21/22 0845 08/20/22 1200   Does patient have an order for bedrest or is patient medically unstable No - Continue assessment No - Continue assessment -- No - Continue assessment --   What is the highest level of mobility based on the progressive mobility assessment? Level 5 (Walks with assist in room/hall) - Balance while stepping forward/back and can walk in room with  assist - Complete Level 5 (Walks with assist in room/hall) - Balance while stepping forward/back and can walk in room with assist - Complete Level 5 (Walks with  assist in room/hall) - Balance while stepping forward/back and can walk in room with assist - Complete Level 5 (Walks with assist in room/hall) - Balance while stepping forward/back and can walk in room with assist - Complete Level 5 (Walks with assist in room/hall) - Balance while stepping forward/back and can walk in room with assist - Complete   Is the above level different from baseline mobility prior to current illness? Yes - Recommend PT order -- -- Yes - Recommend PT order --    [place] Name 08/20/22 0825 08/19/22 1126         Does patient have an order for bedrest or is patient medically unstable No - Continue assessment --      What is the highest level of mobility based on the progressive mobility assessment? Level 5 (Walks with assist in room/hall) - Balance while stepping forward/back and can walk in room with assist - Complete Level 5 (Walks with assist in room/hall) - Balance while stepping forward/back and can walk in room with assist - Complete      Is the above level different from baseline mobility prior to current illness? Yes - Recommend PT order --                Objective: Vitals last 24 hrs: Vitals:   08/21/22 1941 08/21/22 1942 08/22/22 0359 08/22/22 0819  BP: (!) 159/88  (!) 148/80 (!) 145/106  Pulse: 87  88 91  Resp:  19 (!) 23 19  Temp: 99.1 F (37.3 C)  99.1 F (37.3 C) 97.9 F (36.6 C)  TempSrc: Oral  Oral Oral  SpO2:  97% 97% 96%  Weight:   91 kg   Height:       Weight change: 3.5 kg  Physical Examination: General exam: alert awake,older than stated age, weak appearing. HEENT:Oral mucosa moist, Ear/Nose WNL grossly, dentition normal. Respiratory system: bilaterally clear BS, no use of accessory muscle Cardiovascular system: S1 & S2 +, No JVD. Gastrointestinal system:  ruq perc drain +, Abdomen soft,NT,ND, BS+ Nervous System:Alert, awake, moving extremities and grossly nonfocal Extremities: LE edema neg,distal peripheral pulses palpable.  Skin: No  rashes,no icterus. MSK: Normal muscle bulk,tone, power  Medications reviewed:  Scheduled Meds:  amLODipine  10 mg Oral Daily   aspirin  81 mg Oral QHS   brimonidine  1 drop Both Eyes BID WC   dorzolamide-timolol  1 drop Both Eyes BID WC   enoxaparin (LOVENOX) injection  40 mg Subcutaneous Q24H   feeding supplement  237 mL Oral BID BM   insulin aspart  0-5 Units Subcutaneous QHS   insulin aspart  0-9 Units Subcutaneous TID WC   insulin glargine-yfgn  30 Units Subcutaneous Daily   latanoprost  1 drop Both Eyes QHS   lisinopril  20 mg Oral Daily   pantoprazole  40 mg Oral BID AC   sodium chloride flush  5 mL Intracatheter Q8H   sucralfate  1 g Oral TID WC & HS   Continuous Infusions:  0.9 % NaCl with KCl 20 mEq / L 100 mL/hr at 08/22/22 1443   ampicillin-sulbactam (UNASYN) IV 3 g (08/22/22 0440)      Diet Order             Diet Carb Modified Fluid consistency: Thin; Room service appropriate? Yes  Diet  effective now                            Intake/Output Summary (Last 24 hours) at 08/22/2022 1041 Last data filed at 08/22/2022 0820 Gross per 24 hour  Intake 375 ml  Output 1626 ml  Net -1251 ml   Net IO Since Admission: 3,929.38 mL [08/22/22 1041]  Wt Readings from Last 3 Encounters:  08/22/22 91 kg  08/13/22 88.5 kg  08/01/22 88.9 kg     Unresulted Labs (From admission, onward)    None     Data Reviewed: I have personally reviewed following labs and imaging studies CBC: Recent Labs  Lab 08/16/22 0524 08/18/22 0533 08/19/22 1248 08/21/22 0429 08/22/22 0510  WBC 10.8* 16.9* 20.8* 10.9* 11.3*  NEUTROABS  --  13.5*  --   --   --   HGB 12.6* 14.3 13.4 12.3* 12.3*  HCT 39.0 43.9 40.0 37.5* 37.5*  MCV 85.7 83.5 82.1 82.2 83.3  PLT 327 449* 427* 438* 366   Basic Metabolic Panel: Recent Labs  Lab 08/16/22 0524 08/18/22 0305 08/18/22 1250 08/19/22 0128 08/19/22 1622 08/20/22 0726 08/21/22 0429 08/22/22 0510  NA 138 136   < > 133* 134* 138 141  138  K 3.8 4.4   < > 3.5 3.4* 3.1* 3.2* 4.0  CL 106 103   < > 104 105 108 110 110  CO2 23 12*   < > 18* 20* '23 22 22  '$ GLUCOSE 133* 329*   < > 127* 285* 190* 86 98  BUN 12 12   < > '19 14 10 '$ 7* 7*  CREATININE 1.15 1.57*   < > 1.50* 1.36* 1.18 1.10 1.02  CALCIUM 8.5* 8.9   < > 8.6* 8.6* 8.3* 8.5* 8.7*  MG 1.9 1.8  --   --   --   --   --   --    < > = values in this interval not displayed.   GFR: Estimated Creatinine Clearance: 58.9 mL/min (by C-G formula based on SCr of 1.02 mg/dL). Liver Function Tests: Recent Labs  Lab 08/18/22 0305 08/19/22 1248 08/20/22 0726 08/21/22 0429 08/22/22 0510  AST 10* 22 14* 15 27  ALT '10 11 9 8 12  '$ ALKPHOS 82 71 63 56 52  BILITOT 2.3* 0.9 0.8 0.8 1.0  PROT 6.4* 6.3* 5.6* 5.5* 5.6*  ALBUMIN 2.5* 2.5* 2.2* 2.2* 2.3*   No results for input(s): "LIPASE", "AMYLASE" in the last 168 hours. No results for input(s): "AMMONIA" in the last 168 hours. Coagulation Profile: No results for input(s): "INR", "PROTIME" in the last 168 hours. BNP (last 3 results) No results for input(s): "PROBNP" in the last 8760 hours. HbA1C: No results for input(s): "HGBA1C" in the last 72 hours. CBG: Recent Labs  Lab 08/21/22 0620 08/21/22 1106 08/21/22 1611 08/21/22 2123 08/22/22 0621  GLUCAP 98 162* 292* 223* 94   Lipid Profile: No results for input(s): "CHOL", "HDL", "LDLCALC", "TRIG", "CHOLHDL", "LDLDIRECT" in the last 72 hours. Thyroid Function Tests: No results for input(s): "TSH", "T4TOTAL", "FREET4", "T3FREE", "THYROIDAB" in the last 72 hours. Sepsis Labs: No results for input(s): "PROCALCITON", "LATICACIDVEN" in the last 168 hours.  Recent Results (from the past 240 hour(s))  Aerobic/Anaerobic Culture w Gram Stain (surgical/deep wound)     Status: None   Collection Time: 08/17/22  5:23 PM   Specimen: PATH GI biopsy; Bile  Result Value Ref Range Status   Specimen Description BILE  Final   Special Requests NONE  Final   Gram Stain NO WBC SEEN NO  ORGANISMS SEEN   Final   Culture   Final    No growth aerobically or anaerobically. Performed at [facility], [place] [address] [place], [place] [address]    Report Status 08/22/2022 FINAL  Final    Antimicrobials: Anti-infectives (From admission, onward)    Start     Dose/Rate Route Frequency Ordered Stop   08/20/22 1200  Ampicillin-Sulbactam (UNASYN) 3 g in sodium chloride 0.9 % 100 mL IVPB        3 g 200 mL/hr over 30 Minutes Intravenous Every 8 hours 08/20/22 1023     08/14/22 2000  piperacillin-tazobactam (ZOSYN) IVPB 3.375 g  Status:  Discontinued        3.375 g 12.5 mL/hr over 240 Minutes Intravenous Every 8 hours 08/14/22 1516 08/20/22 0959   08/14/22 1145  piperacillin-tazobactam (ZOSYN) IVPB 3.375 g        3.375 g 12.5 mL/hr over 240 Minutes Intravenous  Once 08/14/22 1138 08/14/22 1632      Culture/Microbiology    Component Value Date/Time   SDES BILE 08/17/2022 1723   SPECREQUEST NONE 08/17/2022 1723   CULT  08/17/2022 1723    No growth aerobically or anaerobically. Performed at [facility], [place] [address] [place], [place] [address]    REPTSTATUS 08/22/2022 FINAL 08/17/2022 1723  Radiology Studies: No results found.   LOS: 8 days   Antonieta Pert, MD [facility]  08/22/2022, 10:41 AM

## 2022-08-22 NOTE — Progress Notes (Signed)
Physical Therapy Treatment Patient Details Name: Andre Stout MRN: [id number] DOB: 18-Jun-1940 Today's Date: 08/22/2022   History of Present Illness Pt presented 8/15 with new onset of RUQ abdominal pain.  Underwent percutaneous cholecystostomy tube placement on 8/18. 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 VSS on RA with mobility, will continue to benefit from continued progression of endurance and independence with mobility.    Recommendations for follow up therapy are one component of a multi-disciplinary discharge planning process, led by the attending physician.  Recommendations may be updated based on patient status, additional functional criteria and insurance authorization.  Follow Up Recommendations  Home health PT     Assistance Recommended at Discharge Frequent or constant Supervision/Assistance  Patient can return home with the following A little help with walking and/or transfers;A little help with bathing/dressing/bathroom;Assistance with cooking/housework;Help with stairs or ramp for entrance;Assist for transportation;Direct supervision/assist for financial management;Direct supervision/assist for medications management   Equipment Recommendations  None recommended by PT    Recommendations for Other Services       Precautions / Restrictions Precautions Precautions: Fall Precaution Comments: R cholecystostomy drain Restrictions Weight Bearing Restrictions: No     Mobility  Bed Mobility Overal bed mobility: Needs Assistance Bed Mobility: Supine to Sit Rolling: Supervision Sidelying to sit: Min guard   Sit to supine: Min guard   General bed mobility comments: minA  to pull trunk up in  sitting    Transfers Overall transfer level: Needs assistance Equipment used: Rolling walker (2 wheels) Transfers: Sit to/from Stand Sit to Stand: Min assist           General transfer comment: minA to power up to standing with cues for hand placement    Ambulation/Gait Ambulation/Gait assistance: Min guard Gait Distance (Feet): 125 Feet Assistive device: Rolling walker (2 wheels) Gait Pattern/deviations: Step-through pattern, Trunk flexed, Decreased stride length Gait velocity: decr Gait velocity interpretation: <1.31 ft/sec, indicative of household ambulator   General Gait Details: minG for safety, no overt LOB VSS on RA       Balance Overall balance assessment: Needs assistance Sitting-balance support: No upper extremity supported Sitting balance-Leahy Scale: Fair     Standing balance support: Bilateral upper extremity supported Standing balance-Leahy Scale: Poor Standing balance comment: reliant on RW                            Cognition Arousal/Alertness: Awake/alert Behavior During Therapy: WFL for tasks assessed/performed Overall Cognitive Status: No family/caregiver present to determine baseline cognitive functioning Area of Impairment: Safety/judgement, Attention, Following commands, Problem solving                 Orientation Level: Time (asking if it is 3am x2 during session, despite being initially oriented at start of session) Current Attention Level: Selective Memory: Decreased short-term memory Following Commands: Follows one step commands with increased time Safety/Judgement: Decreased awareness of safety   Problem Solving: Slow processing, Requires verbal cues          Exercises General Exercises - Upper Extremity Shoulder Flexion: AROM, Both, 10 reps, Seated Shoulder Extension: AROM, Both, 10 reps, Seated Shoulder Horizontal ABduction: AROM, Both, 10 reps, Seated Shoulder Horizontal ADduction: AROM, Both, 10  reps,  Seated General Exercises - Lower Extremity Long Arc Quad: AROM, Strengthening, Both, 5 reps, Seated (x5 AROM, x5 against min resistance) Hip ABduction/ADduction: Strengthening, Both, 10 reps, Seated (against mod resistance, 5 sec isometric hold) Other Exercises Other Exercises: hip bridge x5    General Comments General comments (skin integrity, edema, etc.): VSS on RA      Pertinent Vitals/Pain Pain Assessment Pain Assessment: No/denies pain     PT Goals (current goals can now be found in the care plan section) Acute Rehab PT Goals Patient Stated Goal: return home PT Goal Formulation: With patient Time For Goal Achievement: 09/02/22 Potential to Achieve Goals: Good Progress towards PT goals: Progressing toward goals    Frequency    Min 3X/week      PT Plan Current plan remains appropriate       AM-PAC PT "6 Clicks" Mobility   Outcome Measure  Help needed turning from your back to your side while in a flat bed without using bedrails?: A Little Help needed moving from lying on your back to sitting on the side of a flat bed without using bedrails?: A Little Help needed moving to and from a bed to a chair (including a wheelchair)?: A Little Help needed standing up from a chair using your arms (e.g., wheelchair or bedside chair)?: A Little Help needed to walk in hospital room?: A Little Help needed climbing 3-5 steps with a railing? : Total 6 Click Score: 16    End of Session Equipment Utilized During Treatment: Gait belt Activity Tolerance: Patient limited by fatigue Patient left: with call bell/phone within reach;in bed;with bed alarm set Nurse Communication: Mobility status PT Visit Diagnosis: Other abnormalities of gait and mobility (R26.89);Muscle weakness (generalized) (M62.81)     Time: 8592-9244 PT Time Calculation (min) (ACUTE ONLY): 14 min  Charges:  $Therapeutic Exercise: 8-22 mins                     West Carbo, PT, DPT   Acute Rehabilitation  Department   Sandra Cockayne 08/22/2022, 5:37 PM

## 2022-08-22 NOTE — Progress Notes (Signed)
Occupational Therapy Treatment Patient Details Name: Andre Stout MRN: [id number] DOB: Aug 17, 1940 Today's Date: 08/22/2022   History of present illness Pt presented 8/15 with new onset of RUQ abdominal pain.  Underwent percutaneous cholecystostomy tube placement on 8/18. 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 Pt completing UB therex at EOB with supervision, limited L shoulder AROM noted. Pt with disoriented to time, asking x2 during session "what time is it?" When already given time at start of session. Pt presenting with impairments listed below, will follow acutely. Continue to recommend HHOT at d/c.   Recommendations for follow up therapy are one component of a multi-disciplinary discharge planning process, led by the attending physician.  Recommendations may be updated based on patient status, additional functional criteria and insurance authorization.    Follow Up Recommendations  Home health OT    Assistance Recommended at Discharge Frequent or constant Supervision/Assistance  Patient can return home with the following  A little help with walking and/or transfers;A lot of help with bathing/dressing/bathroom;Assistance with cooking/housework;Direct supervision/assist for medications management;Direct supervision/assist for financial management;Assist for transportation;Help with stairs or ramp for entrance   Equipment Recommendations  None recommended by OT;Other (comment) (pt has all needed DME)    Recommendations for Other Services PT consult    Precautions / Restrictions Precautions Precautions: Fall Precaution Comments: R cholecystostomy drain Restrictions Weight Bearing Restrictions: No       Mobility Bed Mobility Overal bed mobility: Needs  Assistance Bed Mobility: Supine to Sit Rolling: Supervision Sidelying to sit: Min guard   Sit to supine: Min guard   General bed mobility comments: minA  to pull trunk up in sitting    Transfers Overall transfer level: Needs assistance Equipment used: Rolling walker (2 wheels) Transfers: Sit to/from Stand Sit to Stand: Min assist                 Balance Overall balance assessment: Needs assistance Sitting-balance support: No upper extremity supported Sitting balance-Leahy Scale: Fair     Standing balance support: Bilateral upper extremity supported Standing balance-Leahy Scale: Poor Standing balance comment: reliant on RW                           ADL either performed or assessed with clinical judgement   ADL Overall ADL's : Needs assistance/impaired                 Upper Body Dressing : Minimal assistance Upper Body Dressing Details (indicate cue type and reason): donned gown Lower Body Dressing: Moderate assistance Lower Body Dressing Details (indicate cue type and reason): to pull up socks Toilet Transfer: Min guard;Rolling walker (2 wheels);Ambulation;Regular Glass blower/designer Details (indicate cue type and reason): simulated         Functional mobility during ADLs: Rolling walker (2 wheels);Min guard      Extremity/Trunk Assessment Upper Extremity Assessment Upper Extremity Assessment: Overall WFL for tasks assessed   Lower Extremity Assessment Lower Extremity Assessment: Defer to PT evaluation        Vision   Vision Assessment?: No apparent visual deficits Additional Comments: hx of glaucoma   Perception Perception Perception: Not tested   Praxis Praxis Praxis: Not tested    Cognition Arousal/Alertness: Awake/alert Behavior During Therapy: [facility] for tasks assessed/performed  Overall Cognitive Status: No family/caregiver present to determine baseline cognitive functioning Area of Impairment: Safety/judgement,  Attention, Following commands, Problem solving                 Orientation Level: Time (asking if it is 3am x2 during session, despite being initially oriented at start of session) Current Attention Level: Selective Memory: Decreased short-term memory Following Commands: Follows one step commands with increased time Safety/Judgement: Decreased awareness of safety   Problem Solving: Slow processing, Requires verbal cues          Exercises Exercises: General Upper Extremity General Exercises - Upper Extremity Shoulder Flexion: AROM, Both, 10 reps, Seated Shoulder Extension: AROM, Both, 10 reps, Seated Shoulder Horizontal ABduction: AROM, Both, 10 reps, Seated Shoulder Horizontal ADduction: AROM, Both, 10 reps, Seated    Shoulder Instructions       General Comments VSS on RA    Pertinent Vitals/ Pain       Pain Assessment Pain Assessment: No/denies pain  Home Living                                          Prior Functioning/Environment              Frequency  Min 2X/week        Progress Toward Goals  OT Goals(current goals can now be found in the care plan section)  Progress towards OT goals: Progressing toward goals  Acute Rehab OT Goals Patient Stated Goal: none stated OT Goal Formulation: With patient Time For Goal Achievement: 09/03/22 Potential to Achieve Goals: Good ADL Goals Pt Will Perform Grooming: standing;with supervision Pt Will Perform Upper Body Dressing: with supervision;sitting Pt Will Perform Lower Body Dressing: with min assist;sit to/from stand;sitting/lateral leans Pt Will Transfer to Toilet: with min guard assist;regular height toilet;ambulating Pt Will Perform Toileting - Clothing Manipulation and hygiene: with supervision;sit to/from stand Pt Will Perform Tub/Shower Transfer: Tub transfer;Shower transfer;shower seat;ambulating;with min assist Additional ADL Goal #1: Pt will recall and complete 3 step trail  making task with no more than supervision using compensatory techniques as needed.  Plan Discharge plan remains appropriate    Co-evaluation                 AM-PAC OT "6 Clicks" Daily Activity     Outcome Measure   Help from another person eating meals?: None Help from another person taking care of personal grooming?: A Little Help from another person toileting, which includes using toliet, bedpan, or urinal?: A Little Help from another person bathing (including washing, rinsing, drying)?: A Lot Help from another person to put on and taking off regular upper body clothing?: A Little Help from another person to put on and taking off regular lower body clothing?: A Lot 6 Click Score: 17    End of Session Equipment Utilized During Treatment: Rolling walker (2 wheels)  OT Visit Diagnosis: Other abnormalities of gait and mobility (R26.89);Muscle weakness (generalized) (M62.81);Other symptoms and signs involving cognitive function   Activity Tolerance Patient tolerated treatment well   Patient Left with call bell/phone within reach;in bed;with bed alarm set   Nurse Communication Mobility status        Time: 8182-9937 OT Time Calculation (min): 15 min  Charges: OT General Charges $OT Visit: 1 Visit OT Treatments $Therapeutic Activity: 8-22 mins  Roger Kettles, OTD, OTR/L Acute Rehab [phone]  Kaylyn Lim 08/22/2022, 4:13 PM

## 2022-08-22 NOTE — Progress Notes (Signed)
Subjective: Says he feels a bit better today and was able to eat applesauce and pears and several other things last night for dinner after starting on the carafate.   Objective: Vital signs in last 24 hours: Temp:  [97.9 F (36.6 C)-99.1 F (37.3 C)] 97.9 F (36.6 C) (08/23 0819) Pulse Rate:  [87-91] 91 (08/23 0819) Resp:  [19-23] 19 (08/23 0819) BP: (145-159)/(80-106) 145/106 (08/23 0819) SpO2:  [96 %-97 %] 96 % (08/23 0819) Weight:  [91 kg] 91 kg (08/23 0359) Last BM Date : 08/22/22  Intake/Output from previous day: 08/22 0701 - 08/23 0700 In: 575 [P.O.:560; I.V.:10] Out: 37 [Urine:900; Drains:25] Intake/Output this shift: Total I/O In: -  Out: 701 [Drains:700; Stool:1]  PE: Gen:  Alert, NAD, pleasant Abd: Soft, mild distention, NT, +BS, IR drain with bilious fluid in bag  Lab Results:  Recent Labs    08/21/22 0429 08/22/22 0510  WBC 10.9* 11.3*  HGB 12.3* 12.3*  HCT 37.5* 37.5*  PLT 438* 386   BMET Recent Labs    08/21/22 0429 08/22/22 0510  NA 141 138  K 3.2* 4.0  CL 110 110  CO2 22 22  GLUCOSE 86 98  BUN 7* 7*  CREATININE 1.10 1.02  CALCIUM 8.5* 8.7*   PT/INR No results for input(s): "LABPROT", "INR" in the last 72 hours. CMP     Component Value Date/Time   NA 138 08/22/2022 0510   K 4.0 08/22/2022 0510   CL 110 08/22/2022 0510   CO2 22 08/22/2022 0510   GLUCOSE 98 08/22/2022 0510   BUN 7 (L) 08/22/2022 0510   CREATININE 1.02 08/22/2022 0510   CREATININE 1.11 01/12/2022 1541   CALCIUM 8.7 (L) 08/22/2022 0510   PROT 5.6 (L) 08/22/2022 0510   ALBUMIN 2.3 (L) 08/22/2022 0510   AST 27 08/22/2022 0510   ALT 12 08/22/2022 0510   ALKPHOS 52 08/22/2022 0510   BILITOT 1.0 08/22/2022 0510   GFRNONAA >60 08/22/2022 0510   GFRAA >60 03/07/2016 0337   Lipase     Component Value Date/Time   LIPASE 22 03/04/2016 1149    Studies/Results: No results found.  Anti-infectives: Anti-infectives (From admission, onward)    Start      Dose/Rate Route Frequency Ordered Stop   08/20/22 1200  Ampicillin-Sulbactam (UNASYN) 3 g in sodium chloride 0.9 % 100 mL IVPB        3 g 200 mL/hr over 30 Minutes Intravenous Every 8 hours 08/20/22 1023     08/14/22 2000  piperacillin-tazobactam (ZOSYN) IVPB 3.375 g  Status:  Discontinued        3.375 g 12.5 mL/hr over 240 Minutes Intravenous Every 8 hours 08/14/22 1516 08/20/22 0959   08/14/22 1145  piperacillin-tazobactam (ZOSYN) IVPB 3.375 g        3.375 g 12.5 mL/hr over 240 Minutes Intravenous  Once 08/14/22 1138 08/14/22 1632        Assessment/Plan Calculous cholecystitis - S/p Perc chole 8/18. Cx's NGTD. Cont abx. Would recommend 10d abx total - It appears nausea has been his main symptom. Never significant pain. After Perc Drain his nausea and epigastric pain with swallowing persist. Nontender on exam. Continue carb mod diet and encourage frequent small meals (6+ snacks rather than 3 large meals daily). Continue ensure.  -carafate has helped some so far.  Given persistent nausea after treatment of the gallbladder, likely needs GI evaluation and work up for nausea as outpatient if able to start taking in  some nutrition while here -no current acute surgical issues.  He is surgically stable for DC when felt to be medically stable -we are available if questions or concerns arise.  -cholecystitis managed with a perc drain for now, continue cards follow up/work up for complete heart block and then we will see him in 6-8 weeks and see where things stand and if he is safe at that point to pursue interval cholecystectomy.  FEN - HH VTE - lovenox ID - [email or web address]   Complete heart block HTN DM H/o prostate cancer   LOS: 8 days    Henreitta Cea , [facility] Surgery 08/22/2022, 10:04 AM Please see Amion for pager number during day hours 7:00am-4:30pm

## 2022-08-22 NOTE — Progress Notes (Signed)
   08/22/22 1100  Mobility  Activity Ambulated with assistance in hallway  Level of Assistance Contact guard assist, steadying assist  Assistive Device Front wheel walker  Distance Ambulated (ft) 90 ft  Activity Response Tolerated well  $Mobility charge 1 Mobility   Mobility Specialist Progress Note  Received pt in bed having no complaints and agreeable to mobility. Pt was asymptomatic throughout ambulation and returned to room w/o fault. Left in bed w/ call bell in reach and all needs met.   Lucious Groves Mobility Specialist

## 2022-08-22 NOTE — Care Management Important Message (Signed)
Important Message  Patient Details  Name: Andre Stout MRN: [id number] Date of Birth: Aug 20, 1940   Medicare Important Message Given:  Yes     Shelda Altes 08/22/2022, 10:41 AM

## 2022-08-23 DIAGNOSIS — K810 Acute cholecystitis: Secondary | ICD-10-CM | POA: Diagnosis not present

## 2022-08-23 LAB — BASIC METABOLIC PANEL
Anion gap: 8 (ref 5–15)
BUN: 6 mg/dL — ABNORMAL LOW (ref 8–23)
CO2: 21 mmol/L — ABNORMAL LOW (ref 22–32)
Calcium: 8.6 mg/dL — ABNORMAL LOW (ref 8.9–10.3)
Chloride: 110 mmol/L (ref 98–111)
Creatinine, Ser: 1.05 mg/dL (ref 0.61–1.24)
GFR, Estimated: >60 mL/min (ref >60)
Glucose, Bld: 42 mg/dL — CL (ref 70–99)
Potassium: 3.9 mmol/L (ref 3.5–5.1)
Sodium: 139 mmol/L (ref 135–145)

## 2022-08-23 LAB — GLUCOSE, CAPILLARY
Glucose-Capillary: 37 mg/dL — CL (ref 70–99)
Glucose-Capillary: 146 mg/dL — ABNORMAL HIGH (ref 70–99)
Glucose-Capillary: 84 mg/dL (ref 70–99)
Glucose-Capillary: 105 mg/dL — ABNORMAL HIGH (ref 70–99)
Glucose-Capillary: 104 mg/dL — ABNORMAL HIGH (ref 70–99)
Glucose-Capillary: 138 mg/dL — ABNORMAL HIGH (ref 70–99)

## 2022-08-23 LAB — CBC
HCT: 36.8 % — ABNORMAL LOW (ref 39.0–52.0)
Hemoglobin: 12.2 g/dL — ABNORMAL LOW (ref 13.0–17.0)
MCH: 27.7 pg (ref 26.0–34.0)
MCHC: 33.2 g/dL (ref 30.0–36.0)
MCV: 83.4 fL (ref 80.0–100.0)
Platelets: 406 10*3/uL — ABNORMAL HIGH (ref 150–400)
RBC: 4.41 MIL/uL (ref 4.22–5.81)
RDW: 14.8 % (ref 11.5–15.5)
WBC: 10.2 10*3/uL (ref 4.0–10.5)
nRBC: 0 % (ref 0.0–0.2)

## 2022-08-23 MED ORDER — DEXTROSE 50 % IV SOLN
25.0000 g | INTRAVENOUS | Status: AC
  Administered 2022-08-23: 25 g via INTRAVENOUS

## 2022-08-23 MED ORDER — INSULIN GLARGINE-YFGN 100 UNIT/ML ~~LOC~~ SOLN
5.0000 [IU] | Freq: Every day | SUBCUTANEOUS | Status: DC
  Administered 2022-08-23 – 2022-08-25 (×3): 5 [IU] via SUBCUTANEOUS
  Filled 2022-08-23 (×4): qty 0.05

## 2022-08-23 NOTE — Progress Notes (Signed)
PROGRESS NOTE Andre Stout  AVW:[id number] DOB: 11-04-1940 DOA: 08/14/2022 PCP: [name], FNP   Brief Narrative/Hospital Course: 82 y.o. M with HTN, prosCA, DM, and heart block (recently admitted for same due to severe hypokalemia) who presented with 3 days RUQ post-prandial painIn the ER, CT suggested acute cholecystitis.  Had leukocytosis.Gen Surg consulted.8/16: Cardiology consulted for recurrent heart block.  S/p percutaneous cholecystostomy tube placed. But on 8/19: Patient developed DKA, placed on insulin drip- Transitioned back to subQ insulin 8/20.8/21-22: Nausea and inability to take PO, sucralfate/PPI started.  Patient has been managed with IV Unasyn. For DM on Insulin 30 units daily and SSI, for HTN home lisinopril aspirin amlodipine. Blood sugar dropped to 37 8/24-insulin dose changed.    Subjective: Seen and examined this morning.   He had hypoglycemia of 37 needing IV dextrose this morning but appears to not eat well last night and was having some epigastric pain yesterday. Ate breakfast denies any pain  Assessment and Plan: Principal Problem:   Acute cholecystitis Active Problems:   Heart block AV complete (HCC)   Odynophagia   AKI (acute kidney injury) ([place])   Benign essential HTN   Diabetes mellitus without complication (HCC)   Acute hypokalemia   DKA, type 2 (HCC)   Hyponatremia   Obesity (BMI 30-39.9)   Myocardial injury   Assessment and Plan:  Acute cholecystitis:S/p percutaneous cholecystostomy tube -draining well, leukocytosis resolved, seen by IR and surgery, patient needs to learn drain care nursing duties, lives with grandson.Continue Unasyn.  Continue drain care as per IR Recent Labs  Lab 08/18/22 0533 08/19/22 1248 08/21/22 0429 08/22/22 0510 08/23/22 0419  WBC 16.9* 20.8* 10.9* 11.3* 10.2    Odynophagia/? esophagitis, less likely candidiasis.  Started  on PPI and sucralfate> with some improvement, was having pain radiating with poor  intake 8/23 blood sugar dropped this morning insulin adjusted.  We will do GI know in case if we need to do any procedure.Dr [name] discussed with EP, if done in an inpatient setting, the patient's heart block should not preclude moderate sedation for endoscopy or general anesthesia for laparoscopy/laparotomy.  Complete heart block:Patient has low amplitude QRS and prominent P wave and the combination is being misidentified by the telemetry system as tachycardia.Marland Kitchen avoid nodal agents, f/u with cardiology outpatient  Myocardial injury:No chest pain, ischemia ruled out.  Class I Obesity w/ BMI 33: Will benefit with weight loss, PCP follow-up  Hyponatremia: Euvolemic and asymptomatic  DKA, type 2 Diabetes mellitus with uncontrolled hyperglycemia With hypoglycemia: Hypoglycemia likely from poor oral intake.  Patient had DKA post op and had resolved  On semglee 30 units> changed to 5 units continue on SSI uptitrate insulin as needed Recent Labs  Lab 08/22/22 1551 08/22/22 2118 08/23/22 0609 08/23/22 0634 08/23/22 0909  GLUCAP 98 84 37* 146* 84   Hypokalemia resolved Essential hypertension BP well controlled on home amlodipine lisinopril, off hydralazine AKI- resolved  DVT prophylaxis: enoxaparin (LOVENOX) injection 40 mg Start: 08/20/22 1800 Code Status:   Code Status: Full Code Family Communication: plan of care discussed with patient at bedside. Patient status is: Inpatient because of ongoing post cholecystectomy percutaneous drain management Level of care: Telemetry Cardiac   Dispo: The patient is from: home w/ grandson            Anticipated disposition: home in 1-2 days.  Pending GI evaluation  Mobility Assessment (last 72 hours)     Mobility Assessment     Row Name 08/22/22 1700 08/22/22  1600 08/22/22 0842 08/21/22 1941 08/21/22 1527   Does patient have an order for bedrest or is patient medically unstable -- -- No - Continue assessment No - Continue assessment --   What  is the highest level of mobility based on the progressive mobility assessment? Level 5 (Walks with assist in room/hall) - Balance while stepping forward/back and can walk in room with assist - Complete Level 5 (Walks with assist in room/hall) - Balance while stepping forward/back and can walk in room with assist - Complete Level 5 (Walks with assist in room/hall) - Balance while stepping forward/back and can walk in room with assist - Complete Level 5 (Walks with assist in room/hall) - Balance while stepping forward/back and can walk in room with assist - Complete Level 5 (Walks with assist in room/hall) - Balance while stepping forward/back and can walk in room with assist - Complete   Is the above level different from baseline mobility prior to current illness? -- -- Yes - Recommend PT order -- --    Row Name 08/21/22 0845 08/20/22 1200         Does patient have an order for bedrest or is patient medically unstable No - Continue assessment --      What is the highest level of mobility based on the progressive mobility assessment? Level 5 (Walks with assist in room/hall) - Balance while stepping forward/back and can walk in room with assist - Complete Level 5 (Walks with assist in room/hall) - Balance while stepping forward/back and can walk in room with assist - Complete      Is the above level different from baseline mobility prior to current illness? Yes - Recommend PT order --                Objective: Vitals last 24 hrs: Vitals:   08/22/22 0819 08/22/22 1208 08/22/22 1941 08/23/22 0609  BP: (!) 145/106 (!) 144/83 (!) 162/93 (!) 157/90  Pulse: 91 79 83 85  Resp: 19 (!) 25 (!) 22 18  Temp: 97.9 F (36.6 C) 98.3 F (36.8 C) 98.1 F (36.7 C) 98.1 F (36.7 C)  TempSrc: Oral Oral Oral Oral  SpO2: 96% 95% 95% 96%  Weight:    92.4 kg  Height:       Weight change: 1.4 kg  Physical Examination: General exam: AAox3, older than stated age, weak appearing. HEENT:Oral mucosa moist,  Ear/Nose WNL grossly, dentition normal. Respiratory system: bilaterally clear, no use of accessory muscle Cardiovascular system: S1 & S2 +, No JVD,. Gastrointestinal system: Abdomen soft,NT,ND,BS+ Nervous System:Alert, awake, moving extremities and grossly nonfocal Extremities: LE ankle edema neg, distal peripheral pulses palpable.  Skin: No rashes,no icterus. MSK: Normal muscle bulk,tone, power   Medications reviewed:  Scheduled Meds:  amLODipine  10 mg Oral Daily   aspirin  81 mg Oral QHS   brimonidine  1 drop Both Eyes BID WC   dorzolamide-timolol  1 drop Both Eyes BID WC   enoxaparin (LOVENOX) injection  40 mg Subcutaneous Q24H   feeding supplement  237 mL Oral BID BM   insulin aspart  0-5 Units Subcutaneous QHS   insulin aspart  0-9 Units Subcutaneous TID WC   insulin glargine-yfgn  5 Units Subcutaneous Daily   latanoprost  1 drop Both Eyes QHS   lisinopril  20 mg Oral Daily   pantoprazole  40 mg Oral BID AC   sodium chloride flush  5 mL Intracatheter Q8H   sucralfate  1 g Oral TID  WC & HS   Continuous Infusions:  0.9 % NaCl with KCl 20 mEq / L 50 mL/hr at 08/22/22 2214   ampicillin-sulbactam (UNASYN) IV 3 g (08/23/22 0457)      Diet Order             Diet Carb Modified Fluid consistency: Thin; Room service appropriate? Yes  Diet effective now                            Intake/Output Summary (Last 24 hours) at 08/23/2022 1022 Last data filed at 08/23/2022 0504 Gross per 24 hour  Intake 2265.37 ml  Output 740 ml  Net 1525.37 ml   Net IO Since Admission: 5,454.75 mL [08/23/22 1022]  Wt Readings from Last 3 Encounters:  08/23/22 92.4 kg  08/13/22 88.5 kg  08/01/22 88.9 kg     Unresulted Labs (From admission, onward)     Start     Ordered   08/23/22 1761  Basic metabolic panel  Daily at 5am,   R     Question:  Specimen collection method  Answer:  Lab=Lab collect   08/22/22 1049   08/23/22 0500  CBC  Daily at 5am,   R     Question:  Specimen  collection method  Answer:  Lab=Lab collect   08/22/22 1049          Data Reviewed: I have personally reviewed following labs and imaging studies CBC: Recent Labs  Lab 08/18/22 0533 08/19/22 1248 08/21/22 0429 08/22/22 0510 08/23/22 0419  WBC 16.9* 20.8* 10.9* 11.3* 10.2  NEUTROABS 13.5*  --   --   --   --   HGB 14.3 13.4 12.3* 12.3* 12.2*  HCT 43.9 40.0 37.5* 37.5* 36.8*  MCV 83.5 82.1 82.2 83.3 83.4  PLT 449* 427* 438* 386 607*   Basic Metabolic Panel: Recent Labs  Lab 08/18/22 0305 08/18/22 1250 08/19/22 1622 08/20/22 0726 08/21/22 0429 08/22/22 0510 08/23/22 0419  NA 136   < > 134* 138 141 138 139  K 4.4   < > 3.4* 3.1* 3.2* 4.0 3.9  CL 103   < > 105 108 110 110 110  CO2 12*   < > 20* '23 22 22 '$ 21*  GLUCOSE 329*   < > 285* 190* 86 98 42*  BUN 12   < > 14 10 7* 7* 6*  CREATININE 1.57*   < > 1.36* 1.18 1.10 1.02 1.05  CALCIUM 8.9   < > 8.6* 8.3* 8.5* 8.7* 8.6*  MG 1.8  --   --   --   --   --   --    < > = values in this interval not displayed.   GFR: Estimated Creatinine Clearance: 57.7 mL/min (by C-G formula based on SCr of 1.05 mg/dL). Liver Function Tests: Recent Labs  Lab 08/18/22 0305 08/19/22 1248 08/20/22 0726 08/21/22 0429 08/22/22 0510  AST 10* 22 14* 15 27  ALT '10 11 9 8 12  '$ ALKPHOS 82 71 63 56 52  BILITOT 2.3* 0.9 0.8 0.8 1.0  PROT 6.4* 6.3* 5.6* 5.5* 5.6*  ALBUMIN 2.5* 2.5* 2.2* 2.2* 2.3*   No results for input(s): "LIPASE", "AMYLASE" in the last 168 hours. No results for input(s): "AMMONIA" in the last 168 hours. Coagulation Profile: No results for input(s): "INR", "PROTIME" in the last 168 hours. BNP (last 3 results) No results for input(s): "PROBNP" in the last 8760 hours. HbA1C: No results for  input(s): "HGBA1C" in the last 72 hours. CBG: Recent Labs  Lab 08/22/22 1551 08/22/22 2118 08/23/22 0609 08/23/22 0634 08/23/22 0909  GLUCAP 98 84 37* 146* 84   Lipid Profile: No results for input(s): "CHOL", "HDL", "LDLCALC",  "TRIG", "CHOLHDL", "LDLDIRECT" in the last 72 hours. Thyroid Function Tests: No results for input(s): "TSH", "T4TOTAL", "FREET4", "T3FREE", "THYROIDAB" in the last 72 hours. Sepsis Labs: No results for input(s): "PROCALCITON", "LATICACIDVEN" in the last 168 hours.  Recent Results (from the past 240 hour(s))  Aerobic/Anaerobic Culture w Gram Stain (surgical/deep wound)     Status: None   Collection Time: 08/17/22  5:23 PM   Specimen: PATH GI biopsy; Bile  Result Value Ref Range Status   Specimen Description BILE  Final   Special Requests NONE  Final   Gram Stain NO WBC SEEN NO ORGANISMS SEEN   Final   Culture   Final    No growth aerobically or anaerobically. Performed at [facility], [place] [address], [place], [place] [address]    Report Status 08/22/2022 FINAL  Final    Antimicrobials: Anti-infectives (From admission, onward)    Start     Dose/Rate Route Frequency Ordered Stop   08/20/22 1200  Ampicillin-Sulbactam (UNASYN) 3 g in sodium chloride 0.9 % 100 mL IVPB        3 g 200 mL/hr over 30 Minutes Intravenous Every 8 hours 08/20/22 1023     08/14/22 2000  piperacillin-tazobactam (ZOSYN) IVPB 3.375 g  Status:  Discontinued        3.375 g 12.5 mL/hr over 240 Minutes Intravenous Every 8 hours 08/14/22 1516 08/20/22 0959   08/14/22 1145  piperacillin-tazobactam (ZOSYN) IVPB 3.375 g        3.375 g 12.5 mL/hr over 240 Minutes Intravenous  Once 08/14/22 1138 08/14/22 1632      Culture/Microbiology    Component Value Date/Time   SDES BILE 08/17/2022 1723   SPECREQUEST NONE 08/17/2022 1723   CULT  08/17/2022 1723    No growth aerobically or anaerobically. Performed at [facility], [place] [address] [place], [place] [address]    REPTSTATUS 08/22/2022 FINAL 08/17/2022 1723  Radiology Studies: No results found.   LOS: 9 days   Antonieta Pert, MD [facility]  08/23/2022, 10:22 AM

## 2022-08-23 NOTE — Progress Notes (Signed)
Physical Therapy Treatment Patient Details Name: Andre Stout MRN: [id number] DOB: 1940-01-09 Today's Date: 08/23/2022   History of Present Illness Pt presented 8/15 with new onset of RUQ abdominal pain.  Underwent percutaneous cholecystostomy tube placement on 8/18. 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    PT Comments    Pt received in supine, agreeable to therapy session and with good participation and fair tolerance for transfer and gait training with RW. Pt limited due to fatigue and tachycardia during household distance gait task, monitor reading vtach for >5 seconds and HR max 153 bpm; with standing break, HR remains >125 bpm and pt returned to recliner, HR slowly decreasing. Pt denies lightheadedness and BP stable pre/post, entered into flowsheet. Mild dyspnea 1/4 DOE. Pt continues to benefit from PT services to progress toward functional mobility goals.   Recommendations for follow up therapy are one component of a multi-disciplinary discharge planning process, led by the attending physician.  Recommendations may be updated based on patient status, additional functional criteria and insurance authorization.  Follow Up Recommendations  Home health PT     Assistance Recommended at Discharge Frequent or constant Supervision/Assistance  Patient can return home with the following A little help with walking and/or transfers;A little help with bathing/dressing/bathroom;Assistance with cooking/housework;Help with stairs or ramp for entrance;Assist for transportation;Direct supervision/assist for financial management;Direct supervision/assist for medications management   Equipment Recommendations  None recommended by PT    Recommendations for Other Services       Precautions / Restrictions Precautions Precautions: Fall Precaution Comments: R cholecystostomy drain Restrictions Weight Bearing Restrictions: No      Mobility  Bed Mobility Overal bed mobility: Needs Assistance Bed Mobility: Supine to Sit Rolling: Supervision         General bed mobility comments: pt using bed rail and HOB partially elevated    Transfers Overall transfer level: Needs assistance Equipment used: Rolling walker (2 wheels) Transfers: Sit to/from Stand Sit to Stand: Min guard           General transfer comment: cues for hand placement to stand and for sitting, poor carryover from previous session    Ambulation/Gait Ambulation/Gait assistance: Min guard Gait Distance (Feet): 100 Feet Assistive device: Rolling walker (2 wheels) Gait Pattern/deviations: Step-through pattern, Trunk flexed, Decreased stride length Gait velocity: decr     General Gait Details: HR elevated to 153 bpm and briefly reading vtach during ambulation, RN notified but busy in another room; entered into flowsheet under ambulation; HR 81-91 bpm resting; BP 167/89 supine prior to OOB and 149/83 reclined in chair post-exertion, no dizziness today; SpO2 WFL on RA      Balance Overall balance assessment: Needs assistance Sitting-balance support: No upper extremity supported Sitting balance-Leahy Scale: Fair     Standing balance support: Bilateral upper extremity supported Standing balance-Leahy Scale: Poor Standing balance comment: reliant on RW                            Cognition Arousal/Alertness: Awake/alert Behavior During Therapy: WFL for tasks assessed/performed, Flat affect Overall Cognitive Status: No family/caregiver present to determine baseline cognitive functioning Area of Impairment: Safety/judgement, Attention, Following commands, Problem solving                 Orientation Level: Time (asking if it is 3am x2 during session, despite being initially oriented at start of session) Current Attention Level: Selective Memory: Decreased short-term  memory Following Commands: Follows one step commands  with increased time Safety/Judgement: Decreased awareness of safety   Problem Solving: Slow processing, Requires verbal cues General Comments: increased processing time to respond to safety cues.        Exercises      General Comments General comments (skin integrity, edema, etc.): HR max 153 bpm and monitor reading Vtach; SpO2 WFL on RA      Pertinent Vitals/Pain Pain Assessment Pain Assessment: Faces Faces Pain Scale: Hurts a little bit Pain Location: abdomen and lower back Pain Descriptors / Indicators: Grimacing, Guarding Pain Intervention(s): Monitored during session, Repositioned           PT Goals (current goals can now be found in the care plan section) Acute Rehab PT Goals Patient Stated Goal: return home PT Goal Formulation: With patient Time For Goal Achievement: 09/02/22 Progress towards PT goals: Progressing toward goals    Frequency    Min 3X/week      PT Plan Current plan remains appropriate       AM-PAC PT "6 Clicks" Mobility   Outcome Measure  Help needed turning from your back to your side while in a flat bed without using bedrails?: None Help needed moving from lying on your back to sitting on the side of a flat bed without using bedrails?: A Little Help needed moving to and from a bed to a chair (including a wheelchair)?: A Little Help needed standing up from a chair using your arms (e.g., wheelchair or bedside chair)?: A Little Help needed to walk in hospital room?: A Little Help needed climbing 3-5 steps with a railing? : A Lot 6 Click Score: 18    End of Session Equipment Utilized During Treatment: Gait belt Activity Tolerance: Patient limited by fatigue;Patient tolerated treatment well Patient left: with call bell/phone within reach;in chair;Other (comment) (pillow under hips and LE reclined in chair, pt encouraged to sit up ~1hr to eat) Nurse Communication: Mobility status;Other (comment) (tachycardia; needs chair alarm on) PT  Visit Diagnosis: Other abnormalities of gait and mobility (R26.89);Muscle weakness (generalized) (M62.81)     Time: 2130-8657 PT Time Calculation (min) (ACUTE ONLY): 26 min  Charges:  $Gait Training: 8-22 mins $Therapeutic Activity: 8-22 mins                     Sudais Banghart P., PTA Acute Rehabilitation Services Secure Chat Preferred 9a-5:30pm Office: [place] 08/23/2022, 6:07 PM

## 2022-08-23 NOTE — Progress Notes (Signed)
Supervising Physician: Corrie Mckusick  Patient Status:  [facility] - In-pt  Chief Complaint: S/p chole tube placement on 08/17/22 with Dr. Serafina Royals  Subjective: Feeling better. Denies much pain. Currently eating regular diet/breakfast  Allergies: Patient has no known allergies.  Medications: Prior to Admission medications   Medication Sig Start Date End Date Taking? Authorizing Provider  acetaminophen (TYLENOL) 500 MG tablet Take 1,000 mg by mouth daily as needed (pain).   Yes [provider]  aspirin 81 MG chewable tablet Chew 1 tablet (81 mg total) by mouth daily. Patient taking differently: Chew 81 mg by mouth at bedtime. 01/06/22  Yes Swayze, Ava, DO  brimonidine (ALPHAGAN) 0.2 % ophthalmic solution Place 1 drop into both eyes 2 (two) times daily with breakfast and lunch. 12/18/21  Yes [provider]  cetirizine (ZYRTEC) 10 MG tablet Take 10 mg by mouth daily as needed (seasonal allergies).   Yes [provider]  insulin NPH-regular Human (70-30) 100 UNIT/ML injection Inject 40 Units into the skin 2 (two) times daily with a meal. Patient taking differently: Inject 36 Units into the skin 2 (two) times daily with a meal. 08/04/22  Yes Pahwani, Ravi, MD  latanoprost (XALATAN) 0.005 % ophthalmic solution Place 1 drop into both eyes at bedtime. 01/01/22  Yes [provider]  magnesium oxide (MAG-OX) 400 (240 Mg) MG tablet Take 400 mg by mouth at bedtime. 06/06/22  Yes [provider]  Multiple Vitamins-Minerals (PRESERVISION AREDS 2) CAPS Take 1 capsule by mouth at bedtime.   Yes [provider]  amLODipine (NORVASC) 10 MG tablet Take 1 tablet (10 mg total) by mouth daily. Patient not taking: Reported on 08/14/2022 08/05/22 09/04/22  Darliss Cheney, MD  BD INSULIN SYRINGE U/F 31G X 5/16" 1 ML MISC USE 2 DAILY 08/24/21   Renato Shin, MD  dorzolamide-timolol (COSOPT) 22.3-6.8 MG/ML ophthalmic solution Place 1 drop into both eyes 2 (two) times daily  with breakfast and lunch. 05/18/22   [provider]  lisinopril (ZESTRIL) 20 MG tablet Take 1 tablet (20 mg total) by mouth daily. Patient not taking: Reported on 08/14/2022 08/05/22 09/04/22  Darliss Cheney, MD  [facility] DELICA LANCETS 27O MISC Use to check blood sugar 4 times per day. Dx code: E11.9 03/27/16   Renato Shin, MD  [facility] ULTRA test strip USE TO MONITOR GLUCOSE LEVELS 4 TIMES PER DAY E11.9 06/10/19   Renato Shin, MD     Vital Signs: BP (!) 157/90 (BP Location: Left Arm)   Pulse 85   Temp 98.1 F (36.7 C) (Oral)   Resp 18   Ht '5\' 5"'$  (1.651 m)   Wt 203 lb 11.3 oz (92.4 kg)   SpO2 96%   BMI 33.90 kg/m   Physical Exam Constitutional:      General: He is not in acute distress. HENT:     Head: Normocephalic and atraumatic.  Cardiovascular:     Rate and Rhythm: Normal rate.  Pulmonary:     Effort: Pulmonary effort is normal. No respiratory distress.  Abdominal:     General: Abdomen is flat.     Palpations: Abdomen is soft.     Tenderness: There is no abdominal tenderness.  Skin:    General: Skin is warm and dry.  Neurological:     General: No focal deficit present.     Mental Status: He is alert and oriented to person, place, and time.  Psychiatric:        Mood and Affect: Mood normal.  Behavior: Behavior normal.   Drain Location: RUQ Size: Fr size: 10 Fr Date of placement: 08/17/22  Currently to: Drain collection device: gravity 24 hour output:  Output by Drain (mL) 08/21/22 0701 - 08/21/22 1900 08/21/22 1901 - 08/22/22 0700 08/22/22 0701 - 08/22/22 1900 08/22/22 1901 - 08/23/22 0700 08/23/22 0701 - 08/23/22 0945  Biliary Tube Cook slip-coat 10.2 Fr. RUQ 25  740     Current examination: Flushes/aspirates easily.  Insertion site unremarkable. Suture and stat lock in place. Dressed appropriately.    Imaging: No results found.  Labs:  CBC: Recent Labs    08/19/22 1248 08/21/22 0429 08/22/22 0510 08/23/22 0419  WBC 20.8* 10.9* 11.3*  10.2  HGB 13.4 12.3* 12.3* 12.2*  HCT 40.0 37.5* 37.5* 36.8*  PLT 427* 438* 386 406*     COAGS: Recent Labs    08/14/22 1200  INR 1.3*  APTT 31     BMP: Recent Labs    08/20/22 0726 08/21/22 0429 08/22/22 0510 08/23/22 0419  NA 138 141 138 139  K 3.1* 3.2* 4.0 3.9  CL 108 110 110 110  CO2 '23 22 22 '$ 21*  GLUCOSE 190* 86 98 42*  BUN 10 7* 7* 6*  CALCIUM 8.3* 8.5* 8.7* 8.6*  CREATININE 1.18 1.10 1.02 1.05  GFRNONAA >60 >60 >60 >60     LIVER FUNCTION TESTS: Recent Labs    08/19/22 1248 08/20/22 0726 08/21/22 0429 08/22/22 0510  BILITOT 0.9 0.8 0.8 1.0  AST 22 14* 15 27  ALT '11 9 8 12  '$ ALKPHOS 71 63 56 52  PROT 6.3* 5.6* 5.5* 5.6*  ALBUMIN 2.5* 2.2* 2.2* 2.3*     Assessment and Plan: Acute cholecystitis s/p perc chole drain placement 8/18 Dark bilious output    Plan: Continue TID flushes with 5 cc NS. Record output Q shift. Dressing changes QD or PRN if soiled.  Call IR APP or on call IR MD if difficulty flushing or sudden change in drain output.   Discharge planning: Please contact IR APP or on call IR MD prior to patient d/c to ensure appropriate follow up plans are in place. Typically patient will follow up with IR clinic 6-8 weeks post d/c for repeat imaging/drain injection. IR scheduler will contact patient with date/time of appointment. Patient will need to flush drain QD with 5 cc NS, record output QD, dressing changes every 2-3 days or earlier if soiled.   IR will continue to follow - please call with questions or concerns.  Electronically Signed: Ascencion Dike, PA-C 08/23/2022, 9:45 AM   I spent a total of 25 Minutes at the the patient's bedside AND on the patient's hospital floor or unit, greater than 50% of which was counseling/coordinating care for drain management

## 2022-08-23 NOTE — Progress Notes (Signed)
Mobility Specialist: Progress Note   08/23/22 1043  Mobility  Activity Ambulated with assistance in hallway  Level of Assistance Minimal assist, patient does 75% or more  Assistive Device Front wheel walker  Distance Ambulated (ft) 170 ft  Activity Response Tolerated well  $Mobility charge 1 Mobility   Pre-Mobility: 89 HR, 96% SpO2 Post-Mobility: 91 HR, 146/79 (99) BP, 95% SpO2  Pt received in the bed and agreeable to mobility. Required minA for bed mobility and contact guard during ambulation. C/o BLE weakness during ambulation, otherwise asymptomatic. Pt back to bed per request after session with call bell and phone at his side.   [facility] Jasira Robinson Mobility Specialist Mobility Specialist [address]: [phone]

## 2022-08-23 NOTE — Consult Note (Signed)
Referring Provider: Dr. Lupita Leash Primary Care Physician:  [name], [place] Primary Gastroenterologist:  Dr. Amedeo Plenty  Reason for Consultation:  Heartburn; Odynophagia  HPI: Andre Stout is a 82 y.o. male admitted for acute cholecystitis s/p percutaneous cholecystostomy tube who developed DKA last week. Over the weekend started having severe heartburn, nausea, and pain with swallowing. Denies feeling that food was not going down but would hurt in his epigastric area when he swallowed. Denies trouble swallowing or pain with swallowing in the past and also denies heartburn. Feels a lot better today without burning with eating and tolerated a solid diet for breakfast without problems. Denies any further nausea. Feels a lot better and he feels burning started because of the liquid diet he was on. History of adenomatous polyps on colonoscopy in July 2017 and never followed up for a surveillance colonoscopy.  Past Medical History:  Diagnosis Date   Diabetes mellitus without complication ([place])    Glaucoma    Heart block    complete heart block   Hypertension    Prostate cancer ([place])    Been 3-4 years ago    Past Surgical History:  Procedure Laterality Date   CATARACT EXTRACTION Bilateral    INSERTION PROSTATE RADIATION SEED     IR PERC CHOLECYSTOSTOMY  08/17/2022    Prior to Admission medications   Medication Sig Start Date End Date Taking? Authorizing Provider  acetaminophen (TYLENOL) 500 MG tablet Take 1,000 mg by mouth daily as needed (pain).   Yes [provider]  aspirin 81 MG chewable tablet Chew 1 tablet (81 mg total) by mouth daily. Patient taking differently: Chew 81 mg by mouth at bedtime. 01/06/22  Yes Swayze, Ava, DO  brimonidine (ALPHAGAN) 0.2 % ophthalmic solution Place 1 drop into both eyes 2 (two) times daily with breakfast and lunch. 12/18/21  Yes [provider]  cetirizine (ZYRTEC) 10 MG tablet Take 10 mg by mouth daily as needed (seasonal allergies).    Yes [provider]  insulin NPH-regular Human (70-30) 100 UNIT/ML injection Inject 40 Units into the skin 2 (two) times daily with a meal. Patient taking differently: Inject 36 Units into the skin 2 (two) times daily with a meal. 08/04/22  Yes Pahwani, Ravi, MD  latanoprost (XALATAN) 0.005 % ophthalmic solution Place 1 drop into both eyes at bedtime. 01/01/22  Yes [provider]  magnesium oxide (MAG-OX) 400 (240 Mg) MG tablet Take 400 mg by mouth at bedtime. 06/06/22  Yes [provider]  Multiple Vitamins-Minerals (PRESERVISION AREDS 2) CAPS Take 1 capsule by mouth at bedtime.   Yes [provider]  amLODipine (NORVASC) 10 MG tablet Take 1 tablet (10 mg total) by mouth daily. Patient not taking: Reported on 08/14/2022 08/05/22 09/04/22  Darliss Cheney, MD  BD INSULIN SYRINGE U/F 31G X 5/16" 1 ML MISC USE 2 DAILY 08/24/21   Renato Shin, MD  dorzolamide-timolol (COSOPT) 22.3-6.8 MG/ML ophthalmic solution Place 1 drop into both eyes 2 (two) times daily with breakfast and lunch. 05/18/22   [provider]  lisinopril (ZESTRIL) 20 MG tablet Take 1 tablet (20 mg total) by mouth daily. Patient not taking: Reported on 08/14/2022 08/05/22 09/04/22  Darliss Cheney, MD  [facility] DELICA LANCETS 40J MISC Use to check blood sugar 4 times per day. Dx code: E11.9 03/27/16   Renato Shin, MD  [facility] ULTRA test strip USE TO MONITOR GLUCOSE LEVELS 4 TIMES PER DAY E11.9 06/10/19   Renato Shin, MD    Scheduled Meds:  amLODipine  10 mg Oral Daily   aspirin  81 mg Oral QHS   brimonidine  1 drop Both Eyes BID WC   dorzolamide-timolol  1 drop Both Eyes BID WC   enoxaparin (LOVENOX) injection  40 mg Subcutaneous Q24H   feeding supplement  237 mL Oral BID BM   insulin aspart  0-5 Units Subcutaneous QHS   insulin aspart  0-9 Units Subcutaneous TID WC   insulin glargine-yfgn  5 Units Subcutaneous Daily   latanoprost  1 drop Both Eyes QHS   lisinopril  20 mg Oral Daily   pantoprazole   40 mg Oral BID AC   sodium chloride flush  5 mL Intracatheter Q8H   sucralfate  1 g Oral TID WC & HS   Continuous Infusions:  ampicillin-sulbactam (UNASYN) IV 3 g (08/23/22 1125)   PRN Meds:.acetaminophen, dextrose, HYDROmorphone (DILAUDID) injection, metoCLOPramide (REGLAN) injection  Allergies as of 08/14/2022   (No Known Allergies)    Family History  Problem Relation Age of Onset   Diabetes Father    Diabetes Paternal Grandfather     Social History   Socioeconomic History   Marital status: Widowed    Spouse name: Not on file   Number of children: 1   Years of education: 32   Highest education level: Not on file  Occupational History   Occupation: Retired  Tobacco Use   Smoking status: Former    Packs/day: 0.50    Years: 4.00    Total pack years: 2.00    Types: Cigarettes    Quit date: 10/29/1974    Years since quitting: 47.8   Smokeless tobacco: Never  Vaping Use   Vaping Use: Never used  Substance and Sexual Activity   Alcohol use: No   Drug use: No   Sexual activity: Not Currently  Other Topics Concern   Not on file  Social History Narrative   Born and raised in [place], [place]. Currently reside in a private residence by himself. Daughter lives in the area. No live. Fun: hunt and fish   Denies religious beliefs that would effect health care.    Social Determinants of Health   Financial Resource Strain: Low Risk  (03/26/2022)   Overall Financial Resource Strain (CARDIA)    Difficulty of Paying Living Expenses: Not hard at all  Food Insecurity: No Food Insecurity (03/26/2022)   Hunger Vital Sign    Worried About Running Out of Food in the Last Year: Never true    Ran Out of Food in the Last Year: Never true  Transportation Needs: No Transportation Needs (03/26/2022)   PRAPARE - Hydrologist (Medical): No    Lack of Transportation (Non-Medical): No  Physical Activity: Inactive (03/26/2022)   Exercise Vital Sign    Days of  Exercise per Week: 0 days    Minutes of Exercise per Session: 0 min  Stress: No Stress Concern Present (03/26/2022)   [place]    Feeling of Stress : Not at all  Social Connections: Unknown (05/28/2019)   Social Connection and Isolation Panel [NHANES]    Frequency of Communication with Friends and Family: More than three times a week    Frequency of Social Gatherings with Friends and Family: More than three times a week    Attends Religious Services: Not on file    Active Member of Clubs or Organizations: Yes    Attends Archivist Meetings: More than 4  times per year    Marital Status: Widowed  Intimate Partner Violence: Not At Risk (03/26/2022)   Humiliation, Afraid, Rape, and Kick questionnaire    Fear of Current or Ex-Partner: No    Emotionally Abused: No    Physically Abused: No    Sexually Abused: No    Review of Systems: All negative except as stated above in HPI.  Physical Exam: Vital signs: Vitals:   08/22/22 1941 08/23/22 0609  BP: (!) 162/93 (!) 157/90  Pulse: 83 85  Resp: (!) 22 18  Temp: 98.1 F (36.7 C) 98.1 F (36.7 C)  SpO2: 95% 96%   Last BM Date : 08/22/22 General:   Lethargic, elderly, Well-developed, well-nourished, pleasant and cooperative in NAD Head: normocephalic, atraumatic Eyes: anicteric sclera ENT: oropharynx clear Neck: supple, nontender Lungs:  Clear throughout to auscultation.   No wheezes, crackles, or rhonchi. No acute distress. Heart:  Regular rate and rhythm; no murmurs, clicks, rubs,  or gallops. Abdomen: soft, nontender, nondistended, +BS, perc chole drain Rectal:  Deferred Ext: no edema  GI:  Lab Results: Recent Labs    08/21/22 0429 08/22/22 0510 08/23/22 0419  WBC 10.9* 11.3* 10.2  HGB 12.3* 12.3* 12.2*  HCT 37.5* 37.5* 36.8*  PLT 438* 386 406*   BMET Recent Labs    08/21/22 0429 08/22/22 0510 08/23/22 0419  NA 141 138 139  K 3.2* 4.0 3.9   CL 110 110 110  CO2 22 22 21*  GLUCOSE 86 98 42*  BUN 7* 7* 6*  CREATININE 1.10 1.02 1.05  CALCIUM 8.5* 8.7* 8.6*   LFT Recent Labs    08/22/22 0510  PROT 5.6*  ALBUMIN 2.3*  AST 27  ALT 12  ALKPHOS 52  BILITOT 1.0   PT/INR No results for input(s): "LABPROT", "INR" in the last 72 hours.   Studies/Results: No results found.  Impression/Plan: 82 yo with heartburn, N/V, odynophagia in setting of acute cholecystitis and s/p perc chole drain. DKA this admission managed by primary team. Heart block managed by primary team. GI symptoms have improved with PPI and Sucralfate. Odynophagia and nausea have resolved. He is eating solid food without any worsened abdominal pain/N/V. I do not think an EGD is needed at this time. When discharged continue PPI BID. Continue Sucralfate to complete a 10 day course. Patient is inactive at [facility] so will need to correct his inactive status to f/u with our office or can f/u with another GI group. Will sign off. Call if questions.    LOS: 9 days   Lear Ng  08/23/2022, 1:10 PM  Questions please call [phone]

## 2022-08-23 NOTE — Plan of Care (Signed)
  Problem: Education: Goal: Ability to describe self-care measures that may prevent or decrease complications (Diabetes Survival Skills Education) will improve Outcome: Progressing Goal: Individualized Educational Video(s) Outcome: Progressing   Problem: Coping: Goal: Ability to adjust to condition or change in health will improve Outcome: Progressing   Problem: Fluid Volume: Goal: Ability to maintain a balanced intake and output will improve Outcome: Progressing   Problem: Health Behavior/Discharge Planning: Goal: Ability to identify and utilize available resources and services will improve Outcome: Progressing Goal: Ability to manage health-related needs will improve Outcome: Progressing   Problem: Metabolic: Goal: Ability to maintain appropriate glucose levels will improve Outcome: Progressing   Problem: Nutritional: Goal: Maintenance of adequate nutrition will improve Outcome: Progressing Goal: Progress toward achieving an optimal weight will improve Outcome: Progressing   Problem: Skin Integrity: Goal: Risk for impaired skin integrity will decrease Outcome: Progressing   Problem: Skin Integrity: Goal: Risk for impaired skin integrity will decrease Outcome: Progressing   Problem: Tissue Perfusion: Goal: Adequacy of tissue perfusion will improve Outcome: Progressing   Problem: Health Behavior/Discharge Planning: Goal: Ability to manage health-related needs will improve Outcome: Progressing   Problem: Clinical Measurements: Goal: Ability to maintain clinical measurements within normal limits will improve Outcome: Progressing Goal: Will remain free from infection Outcome: Progressing Goal: Diagnostic test results will improve Outcome: Progressing Goal: Respiratory complications will improve Outcome: Progressing Goal: Cardiovascular complication will be avoided Outcome: Progressing   Problem: Activity: Goal: Risk for activity intolerance will  decrease Outcome: Progressing   Problem: Nutrition: Goal: Adequate nutrition will be maintained Outcome: Progressing   Problem: Coping: Goal: Level of anxiety will decrease Outcome: Progressing   Problem: Elimination: Goal: Will not experience complications related to bowel motility Outcome: Progressing

## 2022-08-23 NOTE — Progress Notes (Signed)
Dressing changed over biliary drain.

## 2022-08-24 ENCOUNTER — Other Ambulatory Visit (HOSPITAL_COMMUNITY): Payer: Self-pay

## 2022-08-24 ENCOUNTER — Telehealth (HOSPITAL_COMMUNITY): Payer: Self-pay | Admitting: Pharmacy Technician

## 2022-08-24 DIAGNOSIS — K810 Acute cholecystitis: Secondary | ICD-10-CM | POA: Diagnosis not present

## 2022-08-24 LAB — CBC
HCT: 36.8 % — ABNORMAL LOW (ref 39.0–52.0)
Hemoglobin: 11.9 g/dL — ABNORMAL LOW (ref 13.0–17.0)
MCH: 27.1 pg (ref 26.0–34.0)
MCHC: 32.3 g/dL (ref 30.0–36.0)
MCV: 83.8 fL (ref 80.0–100.0)
Platelets: 358 10*3/uL (ref 150–400)
RBC: 4.39 MIL/uL (ref 4.22–5.81)
RDW: 14.8 % (ref 11.5–15.5)
WBC: 10 10*3/uL (ref 4.0–10.5)
nRBC: 0 % (ref 0.0–0.2)

## 2022-08-24 LAB — BASIC METABOLIC PANEL
Anion gap: 13 (ref 5–15)
BUN: 7 mg/dL — ABNORMAL LOW (ref 8–23)
CO2: 18 mmol/L — ABNORMAL LOW (ref 22–32)
Calcium: 8.1 mg/dL — ABNORMAL LOW (ref 8.9–10.3)
Chloride: 101 mmol/L (ref 98–111)
Creatinine, Ser: 1.17 mg/dL (ref 0.61–1.24)
GFR, Estimated: >60 mL/min (ref >60)
Glucose, Bld: 141 mg/dL — ABNORMAL HIGH (ref 70–99)
Potassium: 3.6 mmol/L (ref 3.5–5.1)
Sodium: 132 mmol/L — ABNORMAL LOW (ref 135–145)

## 2022-08-24 LAB — GLUCOSE, CAPILLARY
Glucose-Capillary: 141 mg/dL — ABNORMAL HIGH (ref 70–99)
Glucose-Capillary: 137 mg/dL — ABNORMAL HIGH (ref 70–99)
Glucose-Capillary: 239 mg/dL — ABNORMAL HIGH (ref 70–99)
Glucose-Capillary: 186 mg/dL — ABNORMAL HIGH (ref 70–99)

## 2022-08-24 MED ORDER — PANTOPRAZOLE SODIUM 40 MG PO TBEC
40.0000 mg | DELAYED_RELEASE_TABLET | Freq: Two times a day (BID) | ORAL | 0 refills | Status: DC
  Filled 2022-08-24: qty 60, 30d supply, fill #0

## 2022-08-24 MED ORDER — ACCU-CHEK GUIDE W/DEVICE KIT
PACK | 0 refills | Status: DC
  Filled 2022-08-24 – 2022-10-05 (×2): qty 1, 30d supply, fill #0

## 2022-08-24 MED ORDER — SUCRALFATE 1 GM/10ML PO SUSP
1.0000 g | Freq: Three times a day (TID) | ORAL | 0 refills | Status: DC
  Filled 2022-08-24: qty 420, 11d supply, fill #0

## 2022-08-24 MED ORDER — INSULIN DETEMIR 100 UNIT/ML FLEXPEN
5.0000 [IU] | PEN_INJECTOR | Freq: Every day | SUBCUTANEOUS | 0 refills | Status: DC
Start: 2022-08-24 — End: 2022-08-25
  Filled 2022-08-24: qty 3, 28d supply, fill #0

## 2022-08-24 MED ORDER — SODIUM CHLORIDE FLUSH 0.9 % IV SOLN
5.0000 mL | INTRAVENOUS | 0 refills | Status: DC | PRN
Start: 2022-08-24 — End: 2024-03-03
  Filled 2022-08-24: qty 300, 30d supply, fill #0

## 2022-08-24 MED ORDER — LISINOPRIL 20 MG PO TABS
20.0000 mg | ORAL_TABLET | Freq: Every day | ORAL | 0 refills | Status: DC
  Filled 2022-08-24: qty 30, 30d supply, fill #0

## 2022-08-24 MED ORDER — INSULIN PEN NEEDLE 32G X 4 MM MISC
1.0000 | Freq: Every day | 0 refills | Status: DC
  Filled 2022-08-24: qty 100, 30d supply, fill #0

## 2022-08-24 MED ORDER — AMLODIPINE BESYLATE 10 MG PO TABS
10.0000 mg | ORAL_TABLET | Freq: Every day | ORAL | 0 refills | Status: DC
  Filled 2022-08-24: qty 30, 30d supply, fill #0

## 2022-08-24 NOTE — Progress Notes (Signed)
Physical Therapy Treatment Patient Details Name: DERAN BARRO MRN: [id number] DOB: 1940-08-09 Today's Date: 08/24/2022   History of Present Illness Pt presented 8/15 with new onset of RUQ abdominal pain.  Underwent percutaneous cholecystostomy tube placement on 8/18. 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    PT Comments    Pt tolerates treatment well, ambulating for increased distances. Pt expresses no significant concerns about mobility at this time. Pt and daughter report sufficient support in the home setting. Pt will benefit from continued aggressive mobilization in an effort to improve activity tolerance and restore his prior level of function.   Recommendations for follow up therapy are one component of a multi-disciplinary discharge planning process, led by the attending physician.  Recommendations may be updated based on patient status, additional functional criteria and insurance authorization.  Follow Up Recommendations  Home health PT     Assistance Recommended at Discharge Frequent or constant Supervision/Assistance  Patient can return home with the following A little help with walking and/or transfers;A little help with bathing/dressing/bathroom;Assistance with cooking/housework;Help with stairs or ramp for entrance;Assist for transportation;Direct supervision/assist for financial management;Direct supervision/assist for medications management   Equipment Recommendations  None recommended by PT    Recommendations for Other Services       Precautions / Restrictions Precautions Precautions: Fall Restrictions Weight Bearing Restrictions: No     Mobility  Bed Mobility Overal bed mobility: Needs Assistance Bed Mobility: Sit to Supine, Supine to Sit     Supine to sit: Min guard Sit to supine: Supervision   General bed mobility comments: increased time    Transfers Overall transfer level:  Needs assistance Equipment used: Rolling walker (2 wheels) Transfers: Sit to/from Stand Sit to Stand: Min guard                Ambulation/Gait Ambulation/Gait assistance: Min guard Gait Distance (Feet): 150 Feet Assistive device: Rolling walker (2 wheels) Gait Pattern/deviations: Step-through pattern Gait velocity: reduced Gait velocity interpretation: <1.8 ft/sec, indicate of risk for recurrent falls   General Gait Details: pt with slowed step-through gait, mild increase in trunk flexion   Stairs             Wheelchair Mobility    Modified Rankin (Stroke Patients Only)       Balance Overall balance assessment: Needs assistance Sitting-balance support: No upper extremity supported, Feet supported Sitting balance-Leahy Scale: Good     Standing balance support: Single extremity supported, Reliant on assistive device for balance Standing balance-Leahy Scale: Poor                              Cognition Arousal/Alertness: Awake/alert Behavior During Therapy: WFL for tasks assessed/performed, Flat affect Overall Cognitive Status: Within Functional Limits for tasks assessed                                          Exercises      General Comments General comments (skin integrity, edema, etc.): VSS on RA      Pertinent Vitals/Pain Pain Assessment Pain Assessment: Faces Faces Pain Scale: Hurts a little bit Pain Location: abdomen Pain Descriptors / Indicators: Grimacing Pain Intervention(s): Monitored during session    Home Living  Prior Function            PT Goals (current goals can now be found in the care plan section) Acute Rehab PT Goals Patient Stated Goal: return home Progress towards PT goals: Progressing toward goals    Frequency    Min 3X/week      PT Plan Current plan remains appropriate    Co-evaluation              AM-PAC PT "6 Clicks" Mobility    Outcome Measure  Help needed turning from your back to your side while in a flat bed without using bedrails?: None Help needed moving from lying on your back to sitting on the side of a flat bed without using bedrails?: A Little Help needed moving to and from a bed to a chair (including a wheelchair)?: A Little Help needed standing up from a chair using your arms (e.g., wheelchair or bedside chair)?: A Little Help needed to walk in hospital room?: A Little Help needed climbing 3-5 steps with a railing? : A Lot 6 Click Score: 18    End of Session   Activity Tolerance: Patient tolerated treatment well Patient left: in bed;with call bell/phone within reach;with bed alarm set;with family/visitor present Nurse Communication: Mobility status PT Visit Diagnosis: Other abnormalities of gait and mobility (R26.89);Muscle weakness (generalized) (M62.81)     Time: 1117-3567 PT Time Calculation (min) (ACUTE ONLY): 15 min  Charges:  $Gait Training: 8-22 mins                     Zenaida Niece, PT, DPT Acute Rehabilitation Office [place] 08/24/2022, 12:09 PM

## 2022-08-24 NOTE — Telephone Encounter (Signed)
Patient Advocate Encounter   Received notification that prior authorization for Sucralfate 1GM/10ML suspension is required.   PA submitted on 08/24/2022 Key [id number] Status is pending       Lyndel Safe, [place] Patient Advocate Specialist [place] Patient Advocate Team Direct Number: [phone]  Fax: [phone]

## 2022-08-24 NOTE — Care Management Important Message (Signed)
Important Message  Patient Details  Name: Andre Stout MRN: [id number] Date of Birth: December 17, 1940   Medicare Important Message Given:  Yes     Shelda Altes 08/24/2022, 12:15 PM

## 2022-08-24 NOTE — Telephone Encounter (Signed)
Patient Advocate Encounter  Prior Authorization for Sucralfate 1GM/10ML suspension  has been approved.    PA# [id number] Effective dates: 08/24/2022 through 08/25/2023      Lyndel Safe, [place] Patient Advocate Specialist [place] Patient Advocate Team Direct Number: [phone]  Fax: [phone]

## 2022-08-24 NOTE — Discharge Summary (Signed)
Physician Discharge Summary  Andre Stout URK:[id number] DOB: 08-23-1940 DOA: 08/14/2022  PCP: [name], FNP  Admit date: 08/14/2022 Discharge date: 08/24/2022 Recommendations for Outpatient Follow-up:  Follow up with PCP in 1 weeks-call for appointment. Please obtain BMP/CBC in one week. Follow-up with Dr. Donne Hazel from general surgery. Follow-up with IR drain clinic.  Discharge Dispo: home w/ [facility] Discharge Condition: Stable Code Status:   Code Status: Full Code Diet recommendation:  Diet Order             Diet Carb Modified Fluid consistency: Thin; Room service appropriate? Yes  Diet effective now                 Brief/Interim Summary:82 y.o. M with HTN, prosCA, DM, and heart block (recently admitted for same due to severe hypokalemia) who presented with 3 days RUQ post-prandial painIn the ER, CT suggested acute cholecystitis.  Had leukocytosis.Gen Surg consulted.8/16: Cardiology consulted for recurrent heart block.  S/p percutaneous cholecystostomy tube placed. But on 8/19: Patient developed DKA, placed on insulin drip- Transitioned back to subQ insulin 8/20.8/21-22: Nausea and inability to take PO, sucralfate/PPI started.  Patient has been managed with IV Unasyn. For DM on Insulin 30 units daily and SSI, for HTN home lisinopril aspirin amlodipine. Blood sugar dropped to 37 8/24-insulin dose changed.  There was concern about dysphagia odynophagia GI was consulted-symptoms has improved with PPI and sucralfate no need for further work-up and signed off.  Can follow-up with outpatient GI.  Patient has been educated on drain care.  Patient completed 10 days of IV antibiotics.Planning for home health PT. surgery will be seeing him in 6 to 8 weeks to discuss about interval cholecystectomy.   Discharge Diagnoses:  Principal Problem:   Acute cholecystitis Active Problems:   Heart block AV complete (HCC)   Odynophagia   AKI (acute kidney injury) ([place])   Benign essential  HTN   Diabetes mellitus without complication (HCC)   Acute hypokalemia   DKA, type 2 (HCC)   Hyponatremia   Obesity (BMI 30-39.9)   Myocardial injury  Acute cholecystitis:S/p percutaneous cholecystostomy tube -draining well, leukocytosis resolved, seen by IR and surgery.  Continue fluids as per IR and follow-up with drain clinic.  Follow-up with general surgery in 6 to 8 weeks for cholecystectomy.  Completed 10 days of Unasyn.  At this time tolerating diet.  Odynophagia/? esophagitis,:Started  on PPI and sucralfate> symptoms improved,was seen by [facility] and signed off.  -Dr [name] discussed with EP, if done in an inpatient setting, the patient's heart block should not preclude moderate sedation for endoscopy or general anesthesia for laparoscopy/laparotomy.   Complete heart block:Patient has low amplitude QRS and prominent P wave and the combination is being misidentified by the telemetry system as tachycardia.Marland Kitchen avoid nodal agents, f/u with cardiology outpatient   Myocardial injury:No chest pain, ischemia ruled out.   Class I Obesity w/ BMI 33: Will benefit with weight loss, PCP follow-up   Hyponatremia: Euvolemic and asymptomatic   DKA, type 2 Diabetes mellitus with uncontrolled hyperglycemia With hypoglycemia: Hypoglycemia likely from poor oral intake.  Patient had DKA post op and had resolved.  Insulin dose adjusted to long-acting 5 units-blood sugar has been stable no more hypoglycemia tolerating diet monitor CBG 4 times a day at home monitor updated insulin regimen, follow-up closely with PCP Recent Labs  Lab 08/23/22 0909 08/23/22 1113 08/23/22 1647 08/23/22 2106 08/24/22 0640  GLUCAP 84 105* 104* 138* 141*    Hypokalemia resolved Essential  hypertension BP well controlled on home amlodipine lisinopril, off hydralazine AKI- resolved  Discussed with patient's daughter extensively about follow-up care, diabetes care.  She will be educated on drain care by nursing staff  iron we will arrange outpatient follow-up We will set up home health. She is okay with him going to her house today.  Consults: IR, gen surgery.  Subjective:  Alert awake oriented this morning resting comfortably no more hypoglycemia, reports he will be going to his daughter's house and she will be doing the drain care  Discharge Exam: Vitals:   08/24/22 0329 08/24/22 0330  BP:  (!) 116/57  Pulse:    Resp:  20  Temp:  97.9 F (36.6 C)  SpO2: 93% 95%   General: Pt is alert, awake, not in acute distress Cardiovascular: RRR, S1/S2 +, no rubs, no gallops Respiratory: CTA bilaterally, no wheezing, no rhonchi Abdominal: Soft, NT, ND, bowel sounds + Extremities: no edema, no cyanosis  Discharge Instructions  Discharge Instructions     Discharge instructions   Complete by: As directed    You will need to follow-up with surgery to discuss about your gallbladder surgery Continued drain management Insulin dose has been decreased due to low blood sugar we will need to go up on the insulin dose so discusse with the primary care doctor Check blood sugar 3 times a day and bedtime at home. If blood sugar running above 200 less than 70 please call your MD to adjust insulin. If blood sugars running less 100 do not use insulin and call MD. If you noticed signs and symptoms of hypoglycemia or low blood sugar like jitteriness, confusion, thirst, tremor, sweating- Check blood sugar, drink sugary drink/biscuits/sweets to increase sugar level and call MD or return to ER.    Please call call MD or return to ER for similar or worsening recurring problem that brought you to hospital or if any fever,nausea/vomiting,abdominal pain, uncontrolled pain, chest pain,  shortness of breath or any other alarming symptoms.  Please follow-up your doctor as instructed in a week time and call the office for appointment.  Please avoid alcohol, smoking, or any other illicit substance and maintain healthy habits  including taking your regular medications as prescribed.  You were cared for by a hospitalist during your hospital stay. If you have any questions about your discharge medications or the care you received while you were in the hospital after you are discharged, you can call the unit and ask to speak with the hospitalist on call if the hospitalist that took care of you is not available.  Once you are discharged, your primary care physician will handle any further medical issues. Please note that NO REFILLS for any discharge medications will be authorized once you are discharged, as it is imperative that you return to your primary care physician (or establish a relationship with a primary care physician if you do not have one) for your aftercare needs so that they can reassess your need for medications and monitor your lab values.  Check blood sugar 3 times a day and bedtime at home. If blood sugar running above 200 less than 70 please call your MD to adjust insulin. If blood sugars running less 100 do not use insulin and call MD. If you noticed signs and symptoms of hypoglycemia or low blood sugar like jitteriness, confusion, thirst, tremor, sweating- Check blood sugar, drink sugary drink/biscuits/sweets to increase sugar level and call MD or return to ER.   Lantus Insulin Pen-  Order # [id number] Novolog Insulin Pen- Order # [id number]  Novolin 70/30 flexpen  - #[id number] Blood glucose meter - #[id number]  Insulin pen needles - #[id number]   Discharge wound care:   Complete by: As directed    Cont drain and wound care per drain clinic   Patient to follow-up with IR clinic 10 to 14 days post DC for repeat imaging/possible drain injection, will need to flush drain daily with 5 cc NS record output daily, dressing change every 2 to 3 days ordered if soiled. Also Repeat Imaging/possible drain injection ocne output < 71m/QD   Increase activity slowly   Complete by: As directed       Allergies as of 08/24/2022    No Known Allergies      Medication List     STOP taking these medications    insulin NPH-regular Human (70-30) 100 UNIT/ML injection       TAKE these medications    acetaminophen 500 MG tablet Commonly known as: TYLENOL Take 1,000 mg by mouth daily as needed (pain).   amLODipine 10 MG tablet Commonly known as: NORVASC Take 1 tablet (10 mg total) by mouth daily.   aspirin 81 MG chewable tablet Chew 1 tablet (81 mg total) by mouth daily. What changed: when to take this   BD Insulin Syringe U/F 31G X 5/16" 1 ML Misc Generic drug: Insulin Syringe-Needle U-100 USE 2 DAILY   blood glucose meter kit and supplies Kit Dispense based on patient and insurance preference. Use up to four times daily as directed.   brimonidine 0.2 % ophthalmic solution Commonly known as: ALPHAGAN Place 1 drop into both eyes 2 (two) times daily with breakfast and lunch.   cetirizine 10 MG tablet Commonly known as: ZYRTEC Take 10 mg by mouth daily as needed (seasonal allergies).   dorzolamide-timolol 22.3-6.8 MG/ML ophthalmic solution Commonly known as: COSOPT Place 1 drop into both eyes 2 (two) times daily with breakfast and lunch.   insulin detemir 100 UNIT/ML FlexPen Commonly known as: LEVEMIR Inject 5 Units into the skin daily.   Insulin Pen Needle 31G X 5 MM Misc 1 each by Does not apply route daily at 6 (six) AM.   latanoprost 0.005 % ophthalmic solution Commonly known as: XALATAN Place 1 drop into both eyes at bedtime.   lisinopril 20 MG tablet Commonly known as: ZESTRIL Take 1 tablet (20 mg total) by mouth daily.   magnesium oxide 400 (240 Mg) MG tablet Commonly known as: MAG-OX Take 400 mg by mouth at bedtime.   OneTouch Delica Lancets 337TMisc Use to check blood sugar 4 times per day. Dx code: E11.9   OneTouch Ultra test strip Generic drug: glucose blood USE TO MONITOR GLUCOSE LEVELS 4 TIMES PER DAY E11.9   pantoprazole 40 MG tablet Commonly known as:  PROTONIX Take 1 tablet (40 mg total) by mouth 2 (two) times daily before a meal.   PreserVision AREDS 2 Caps Take 1 capsule by mouth at bedtime.   sodium chloride flush 0.9 % Soln injection Inject 5 mLs into the vein as needed for up to 30 doses.   sucralfate 1 GM/10ML suspension Commonly known as: CARAFATE Take 10 mLs (1 g total) by mouth 4 (four) times daily -  with meals and at bedtime.               Discharge Care Instructions  (From admission, onward)           Start     Ordered  08/24/22 0000  Discharge wound care:       Comments: Cont drain and wound care per drain clinic   Patient to follow-up with IR clinic 10 to 14 days post DC for repeat imaging/possible drain injection, will need to flush drain daily with 5 cc NS record output daily, dressing change every 2 to 3 days ordered if soiled. Also Repeat Imaging/possible drain injection ocne output < 49ml/QD   08/24/22 1106            Follow-up Information     Rolm Bookbinder, MD Follow up.   Specialty: General Surgery Why: 09/28/22, please arrive at 10:30. Contact information: [address] [place] [address] [phone]         Suzette Battiest, MD Follow up.   Specialties: Interventional Radiology, Diagnostic Radiology, Radiology Contact information: [place] [address] [phone]         [name], [place] Follow up in 1 week(s).   Specialty: Internal Medicine Contact information: [address] [place] [address] [place], Cassie Freer, MD .   Specialties: Cardiology, Internal Medicine, Radiology Contact information: [place] [address] [phone]         Evans Lance, MD .   Specialty: Cardiology Contact information: [address] [place] [address] [phone]                No Known Allergies  The results of  significant diagnostics from this hospitalization (including imaging, microbiology, ancillary and laboratory) are listed below for reference.    Microbiology: Recent Results (from the past 240 hour(s))  Aerobic/Anaerobic Culture w Gram Stain (surgical/deep wound)     Status: None   Collection Time: 08/17/22  5:23 PM   Specimen: PATH GI biopsy; Bile  Result Value Ref Range Status   Specimen Description BILE  Final   Special Requests NONE  Final   Gram Stain NO WBC SEEN NO ORGANISMS SEEN   Final   Culture   Final    No growth aerobically or anaerobically. Performed at [facility], [place] [address] [place], [place] [address]    Report Status 08/22/2022 FINAL  Final    Procedures/Studies: IR Perc Cholecystostomy  Result Date: 08/21/2022 INDICATION: 82 year old male with history of acute calculus cholecystitis, poor surgical candidate. EXAM: Fluoroscopic and ultrasound-guided cholecystostomy tube placement. MEDICATIONS: The patient was receiving intravenous antibiotics as an inpatient, no additional antibiotics were required.; The antibiotic was administered within an appropriate time frame prior to the initiation of the procedure. ANESTHESIA/SEDATION: Moderate (conscious) sedation was employed during this procedure. A total of Versed 0 mg and Fentanyl 100 mcg was administered intravenously. Moderate Sedation Time: 11 minutes. The patient's level of consciousness and vital signs were monitored continuously by radiology nursing throughout the procedure under my direct supervision. FLUOROSCOPY TIME:  Sixteen mGy COMPLICATIONS: None immediate. PROCEDURE: Informed written consent was obtained from the patient after a thorough discussion of the procedural risks, benefits and alternatives. All questions were addressed. Maximal Sterile Barrier Technique was utilized including caps, mask, sterile gowns, sterile gloves, sterile drape, hand hygiene and skin antiseptic. A timeout was performed prior to  the initiation of the procedure. The patient was placed supine on the angiographic table. The patient's right upper quadrant was then prepped and draped in normal sterile fashion with maximum sterile barrier. Ultrasound demonstrates a distended  gallbladder. Subdermal Local anesthesia was provided at the planned skin entry site. Under ultrasound guidance, deeper local anesthetic was provided through intercostal muscles and along the liver capsule. Ultrasound was used to puncture the gallbladder using a 18 gauge trocar needle via a subhepatic/transperitoneal approach with visualization of the lung treated to the gallbladder. A 0.035 inch wire was advanced into the lumen and a transition dilator placed. A gentle hand injection of contrast was performed. Cholecystogram demonstrates irregular filling defects consistent with gallstones. The cystic duct is obstructed. A 0.035 inch exchange wire was placed in the tract was dilated. A 10.2 French multipurpose drainage catheter was advanced into the gallbladder lumen. The drain was then secured in place using a 0-silk suture and a Stayfix device. A sterile dressing was applied. The tube was placed to bag drainage. A culture was sent to the lab for analysis. The patient tolerated procedure well without evidence of immediate complication was transferred back to the floor in stable condition. IMPRESSION: Successful placement of percutaneous, subhepatic/transperitoneal cholecystostomy tube. Ruthann Cancer, MD [facility] Electronically Signed   By: Ruthann Cancer M.D.   On: 08/21/2022 16:35   ECHOCARDIOGRAM COMPLETE  Result Date: 08/16/2022    ECHOCARDIOGRAM REPORT   Patient Name:   Andre Stout Date of Exam: 08/16/2022 Medical Rec #:  [id number]      Height:       64.0 in Accession #:    [id number]     Weight:       183.2 lb Date of Birth:  Nov 19, 1940     BSA:          1.885 m Patient Age:    82 years       BP:            200/91 mmHg Patient Gender: M              HR:           65 bpm. Exam Location:  Inpatient Procedure: 2D Echo, Color Doppler, Cardiac Doppler and Intracardiac            Opacification Agent Indications:    Heart Block  History:        Patient has prior history of Echocardiogram examinations, most                 recent 01/03/2022. Risk Factors:Hypertension and Diabetes.  Sonographer:    Raquel Sarna Senior RDCS Referring Phys: [id number] Donato Heinz  Sonographer Comments: Suboptimal apical window. IMPRESSIONS  1. Left ventricular ejection fraction, by estimation, is 60 to 65%. The left ventricle has normal function. The left ventricle has no regional wall motion abnormalities. There is mild left ventricular hypertrophy. Left ventricular diastolic parameters are consistent with Grade I diastolic dysfunction (impaired relaxation).  2. Right ventricular systolic function is normal. The right ventricular size is normal. Tricuspid regurgitation signal is inadequate for assessing PA pressure.  3. Left atrial size was moderately dilated.  4. The mitral valve is abnormal. Trivial mitral valve regurgitation.  5. The aortic valve was not well visualized. Aortic valve regurgitation is not visualized.  6. The inferior vena cava is normal in size with greater than 50% respiratory variability, suggesting right atrial pressure of 3 mmHg.  7. Cannot exclude a small PFO. Comparison(s): Prior images unable to be directly viewed, comparison made by report only. Changes from prior study are noted. 01/03/2022: LVEF 55-60%. FINDINGS  Left Ventricle: Left ventricular ejection fraction, by estimation, is 60  to 65%. The left ventricle has normal function. The left ventricle has no regional wall motion abnormalities. Definity contrast agent was given IV to delineate the left ventricular  endocardial borders. The left ventricular internal cavity size was normal in size. There is mild left ventricular hypertrophy. Left ventricular diastolic  parameters are consistent with Grade I diastolic dysfunction (impaired relaxation). Indeterminate filling pressures. Right Ventricle: The right ventricular size is normal. No increase in right ventricular wall thickness. Right ventricular systolic function is normal. Tricuspid regurgitation signal is inadequate for assessing PA pressure. Left Atrium: Left atrial size was moderately dilated. Right Atrium: Right atrial size was normal in size. Pericardium: There is no evidence of pericardial effusion. Mitral Valve: The mitral valve is abnormal. There is mild thickening of the mitral valve leaflet(s). Trivial mitral valve regurgitation. Tricuspid Valve: The tricuspid valve is not well visualized. Tricuspid valve regurgitation is not demonstrated. Aortic Valve: The aortic valve was not well visualized. Aortic valve regurgitation is not visualized. Pulmonic Valve: The pulmonic valve was not well visualized. Pulmonic valve regurgitation is not visualized. Aorta: The aortic root and ascending aorta are structurally normal, with no evidence of dilitation. Venous: The inferior vena cava is normal in size with greater than 50% respiratory variability, suggesting right atrial pressure of 3 mmHg. IAS/Shunts: The interatrial septum is aneurysmal. Cannot exclude a small PFO.  LEFT VENTRICLE PLAX 2D LVIDd:         4.00 cm   Diastology LVIDs:         2.50 cm   LV e' medial:    4.79 cm/s LV PW:         1.00 cm   LV E/e' medial:  17.5 LV IVS:        1.10 cm   LV e' lateral:   7.51 cm/s LVOT diam:     2.10 cm   LV E/e' lateral: 11.2 LV SV:         67 LV SV Index:   35 LVOT Area:     3.46 cm  RIGHT VENTRICLE RV S prime:     11.70 cm/s TAPSE (M-mode): 1.8 cm LEFT ATRIUM             Index        RIGHT ATRIUM           Index LA diam:        3.70 cm 1.96 cm/m   RA Area:     19.30 cm LA Vol (A2C):   64.9 ml 34.43 ml/m  RA Volume:   57.10 ml  30.30 ml/m LA Vol (A4C):   95.8 ml 50.83 ml/m LA Biplane Vol: 78.8 ml 41.81 ml/m  AORTIC  VALVE LVOT Vmax:   88.80 cm/s LVOT Vmean:  59.100 cm/s LVOT VTI:    0.193 m  AORTA Ao Root diam: 3.20 cm Ao Asc diam:  3.00 cm MITRAL VALVE MV Area (PHT): 3.85 cm    SHUNTS MV Decel Time: 197 msec    Systemic VTI:  0.19 m MV E velocity: 84.00 cm/s  Systemic Diam: 2.10 cm MV A velocity: 94.70 cm/s MV E/A ratio:  0.89 Lyman Bishop MD Electronically signed by Lyman Bishop MD Signature Date/Time: 08/16/2022/10:56:43 AM    Final    US Abdomen Limited RUQ (LIVER/GB)  Result Date: 08/14/2022 CLINICAL DATA:  Right upper quadrant pain and vomiting EXAM: ULTRASOUND ABDOMEN LIMITED RIGHT UPPER QUADRANT COMPARISON:  CT of earlier today FINDINGS: Gallbladder: Small gallstones. Wall thickening at 5 mm. Trace pericholecystic  edema and fluid, including on CT. The technologist describes tenderness with gallbladder palpation. Common bile duct: Diameter: Normal, 5 mm Liver: No focal lesion identified. Within normal limits in parenchymal echogenicity. Portal vein is patent on color Doppler imaging with normal direction of blood flow towards the liver. Other: None. IMPRESSION: Cholelithiasis with findings suspicious for acute cholecystitis. Electronically Signed   By: Abigail Miyamoto M.D.   On: 08/14/2022 10:43   CT ABDOMEN PELVIS W CONTRAST  Result Date: 08/14/2022 CLINICAL DATA:  Abdominal pain, constipation, and vomiting. EXAM: CT ABDOMEN AND PELVIS WITH CONTRAST TECHNIQUE: Multidetector CT imaging of the abdomen and pelvis was performed using the standard protocol following bolus administration of intravenous contrast. RADIATION DOSE REDUCTION: This exam was performed according to the departmental dose-optimization program which includes automated exposure control, adjustment of the mA and/or kV according to patient size and/or use of iterative reconstruction technique. CONTRAST:  142m OMNIPAQUE IOHEXOL 300 MG/ML  SOLN COMPARISON:  None Available. FINDINGS: Lower chest: The heart is enlarged and a few coronary artery  calcifications are noted. Mild atelectasis or scarring is present at the lung bases. Hepatobiliary: No focal liver abnormality. No biliary ductal dilatation. Stones are present within the gallbladder. There is gallbladder wall thickening with pericholecystic edema and fat stranding Pancreas: Pancreatic atrophy. No pancreatic ductal dilatation or surrounding inflammatory changes. Spleen: Normal in size without focal abnormality. Adrenals/Urinary Tract: No adrenal nodule or mass. The kidneys enhance symmetrically. A large cyst with thin rim calcification is noted in the lower pole the right kidney measuring 9.8 cm. No renal calculus or hydronephrosis. The bladder is unremarkable. Stomach/Bowel: There is thickening of the walls of the distal esophagus and a small hiatal hernia is noted. The stomach is otherwise within normal limits. No bowel obstruction, free air, or pneumatosis. The appendix is not visualized on exam, however there are no inflammatory changes in the right lower quadrant. Vascular/Lymphatic: Aortic atherosclerosis. No abdominal or pelvic lymphadenopathy by size criteria. Reproductive: Metallic densities are present in the prostate gland, possibly representing radiation therapy seeds. Other: Fat containing left inguinal and umbilical hernias are noted. No ascites. Musculoskeletal: Increased density is noted in the retroareolar regions bilaterally, greater on the right than on the left which may be due to patient positioning, possible gynecomastia. Degenerative changes are present in the thoracolumbar spine. No acute osseous abnormality. IMPRESSION: 1. Cholelithiasis with gallbladder wall thickening and surrounding inflammatory changes, possible acute cholecystitis. Ultrasound is recommended for further evaluation. 2. Thickening of the walls of the distal esophagus with small hiatal hernia. Endoscopy is suggested for further evaluation on follow-up. 3. Right renal cyst. 4. Aortic atherosclerosis.  Electronically Signed   By: LBrett FairyM.D.   On: 08/14/2022 04:55   MR BRAIN W WO CONTRAST  Result Date: 08/01/2022 CLINICAL DATA:  Abnormal CT with possible meningioma or schwannoma EXAM: MRI HEAD WITHOUT AND WITH CONTRAST TECHNIQUE: Multiplanar, multiecho pulse sequences of the brain and surrounding structures were obtained without and with intravenous contrast. CONTRAST:  839mGADAVIST GADOBUTROL 1 MMOL/ML IV SOLN COMPARISON:  None Available. FINDINGS: Motion artifact is present. Brain: There is a dural-based enhancing extra-axial lesion of the right cerebellopontine angle cistern measuring about 1.8 x 1 x 2 cm. There is some extension into the internal auditory canal along the dura. The 7/8 nerve complex is poorly evaluated on these large field-of-view, motion degraded images. No significant mass effect on the adjacent brachium pontis. No parenchymal edema. There is no acute infarction or intracranial hemorrhage. There is no hydrocephalus  or extra-axial fluid collection. Prominence of the ventricles and sulci reflects parenchymal volume loss. Patchy and confluent areas of T2 hyperintensity in the supratentorial and pontine white matter are nonspecific may reflect mild to moderate chronic microvascular ischemic changes. Vascular: Major vessel flow voids at the skull base are preserved. Skull and upper cervical spine: Normal marrow signal is preserved. Sinuses/Orbits: Paranasal sinus mucosal thickening. Bilateral lens replacements. Other: Sella is unremarkable.  Mastoid air cells are clear. 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 No significant mass effect or parenchymal edema. 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 Electronically Signed   By: Macy Mis M.D.   On: 08/01/2022 09:26   CT HEAD WO CONTRAST (5MM)  Result Date: 07/31/2022 CLINICAL DATA:  Possible seizure, memory loss. EXAM: CT HEAD WITHOUT CONTRAST TECHNIQUE:  Contiguous axial images were obtained from the base of the skull through the vertex without intravenous contrast. RADIATION DOSE REDUCTION: This exam was performed according to the departmental dose-optimization program which includes automated exposure control, adjustment of the mA and/or kV according to patient size and/or use of iterative reconstruction technique. COMPARISON:  01/02/2022. FINDINGS: Brain: No acute intracranial hemorrhage, midline shift or mass effect. No extra-axial fluid collection. Generalized atrophy is noted. Patchy and confluent white matter hypodensities are noted bilaterally and not significantly changed from the prior exam. A hyperdense structure is noted in the CP angle on the right measuring 1.6 x 0.8 cm. No hydrocephalus. Vascular: Atherosclerotic calcification of the carotid siphons. No hyperdense vessel. Skull: Normal. Negative for fracture or focal lesion. Sinuses/Orbits: Mucosal thickening is noted in the ethmoid air cells. The orbits are within normal limits. Other: Increased density is noted in the scalp over the parietal bone on the right, unchanged from 2021. IMPRESSION: 1. No acute intracranial hemorrhage. 2. Atrophy with chronic microvascular ischemic changes. 3. Dural-based asymmetry in the CP angle on the right, possible meningioma or schwannoma. MRI is recommended for further characterization on follow-up. Electronically Signed   By: Brett Fairy M.D.   On: 07/31/2022 21:18   DG Chest Port 1 View  Result Date: 07/31/2022 CLINICAL DATA:  Altered mental status/seizures EXAM: PORTABLE CHEST 1 VIEW COMPARISON:  Radiographs 10/09/2020 FINDINGS: Low lung volumes. Unchanged borderline enlargement of the cardiomediastinal silhouette. Aortic calcifications. Prominent fat pad at the cardiac apex. No focal consolidation, pleural effusion, or pneumothorax. IMPRESSION: No acute abnormality.  Hypoinflation. Electronically Signed   By: Placido Sou M.D.   On: 07/31/2022 20:39     Labs: BNP (last 3 results) Recent Labs    07/31/22 2013  BNP 353.2*   Basic Metabolic Panel: Recent Labs  Lab 08/18/22 0305 08/18/22 1250 08/20/22 0726 08/21/22 0429 08/22/22 0510 08/23/22 0419 08/24/22 0508  NA 136   < > 138 141 138 139 132*  K 4.4   < > 3.1* 3.2* 4.0 3.9 3.6  CL 103   < > 108 110 110 110 101  CO2 12*   < > 23 22 22  21* 18*  GLUCOSE 329*   < > 190* 86 98 42* 141*  BUN 12   < > 10 7* 7* 6* 7*  CREATININE 1.57*   < > 1.18 1.10 1.02 1.05 1.17  CALCIUM 8.9   < > 8.3* 8.5* 8.7* 8.6* 8.1*  MG 1.8  --   --   --   --   --   --    < > = values in this interval not displayed.  Liver Function Tests: Recent Labs  Lab 08/18/22 0305 08/19/22 1248 08/20/22 0726 08/21/22 0429 08/22/22 0510  AST 10* 22 14* 15 27  ALT 10 11 9 8 12   ALKPHOS 82 71 63 56 52  BILITOT 2.3* 0.9 0.8 0.8 1.0  PROT 6.4* 6.3* 5.6* 5.5* 5.6*  ALBUMIN 2.5* 2.5* 2.2* 2.2* 2.3*   No results for input(s): "LIPASE", "AMYLASE" in the last 168 hours. No results for input(s): "AMMONIA" in the last 168 hours. CBC: Recent Labs  Lab 08/18/22 0533 08/19/22 1248 08/21/22 0429 08/22/22 0510 08/23/22 0419 08/24/22 0508  WBC 16.9* 20.8* 10.9* 11.3* 10.2 10.0  NEUTROABS 13.5*  --   --   --   --   --   HGB 14.3 13.4 12.3* 12.3* 12.2* 11.9*  HCT 43.9 40.0 37.5* 37.5* 36.8* 36.8*  MCV 83.5 82.1 82.2 83.3 83.4 83.8  PLT 449* 427* 438* 386 406* 358   Cardiac Enzymes: No results for input(s): "CKTOTAL", "CKMB", "CKMBINDEX", "TROPONINI" in the last 168 hours. BNP: Invalid input(s): "POCBNP" CBG: Recent Labs  Lab 08/23/22 0909 08/23/22 1113 08/23/22 1647 08/23/22 2106 08/24/22 0640  GLUCAP 84 105* 104* 138* 141*   D-Dimer No results for input(s): "DDIMER" in the last 72 hours. Hgb A1c No results for input(s): "HGBA1C" in the last 72 hours. Lipid Profile No results for input(s): "CHOL", "HDL", "LDLCALC", "TRIG", "CHOLHDL", "LDLDIRECT" in the last 72 hours. Thyroid function studies No  results for input(s): "TSH", "T4TOTAL", "T3FREE", "THYROIDAB" in the last 72 hours.  Invalid input(s): "FREET3" Anemia work up No results for input(s): "VITAMINB12", "FOLATE", "FERRITIN", "TIBC", "IRON", "RETICCTPCT" in the last 72 hours. Urinalysis    Component Value Date/Time   COLORURINE YELLOW 08/14/2022 1531   APPEARANCEUR CLEAR 08/14/2022 1531   LABSPEC 1.036 (H) 08/14/2022 1531   PHURINE 7.0 08/14/2022 1531   GLUCOSEU >=500 (A) 08/14/2022 1531   HGBUR NEGATIVE 08/14/2022 1531   BILIRUBINUR NEGATIVE 08/14/2022 1531   KETONESUR 5 (A) 08/14/2022 1531   PROTEINUR >=300 (A) 08/14/2022 1531   UROBILINOGEN 1.0 10/04/2009 1109   NITRITE NEGATIVE 08/14/2022 1531   LEUKOCYTESUR NEGATIVE 08/14/2022 1531   Sepsis Labs Recent Labs  Lab 08/21/22 0429 08/22/22 0510 08/23/22 0419 08/24/22 0508  WBC 10.9* 11.3* 10.2 10.0   Microbiology Recent Results (from the past 240 hour(s))  Aerobic/Anaerobic Culture w Gram Stain (surgical/deep wound)     Status: None   Collection Time: 08/17/22  5:23 PM   Specimen: PATH GI biopsy; Bile  Result Value Ref Range Status   Specimen Description BILE  Final   Special Requests NONE  Final   Gram Stain NO WBC SEEN NO ORGANISMS SEEN   Final   Culture   Final    No growth aerobically or anaerobically. Performed at [facility], [place] [address] [place], [place] [address]    Report Status 08/22/2022 FINAL  Final  Time coordinating discharge: 35 minutes  SIGNED: Antonieta Pert, MD  [facility] 08/24/2022, 11:06 AM  If 7PM-7AM, please contact night-coverage [email or web address]

## 2022-08-24 NOTE — Progress Notes (Signed)
Just received handoff on this patient from off going nurse [place].

## 2022-08-24 NOTE — Progress Notes (Signed)
Pharmacy medications have been approved by patient's insurance including carafate.  Daughter is concerned that her family's food intake is not adequate enough for him to go home.  Home health has been ordered for patient. Education has been given to the patient Andre Stout and his daughter on how to flush the biliary drain. Return demonstration was given by his daughter.  Patient has ambulated with physical therapy and OT. Patient has been alert and oriented x 4 most days with no nausea or vomiting or episodes of dizziness.  Spoke with IR at [phone] about a follow up appointment in regards to biliary drain. Olegario Shearer will notify the family after discharge about an appointment post discharge.

## 2022-08-24 NOTE — TOC Transition Note (Addendum)
Transition of Care [facility]) - CM/SW Discharge Note   Patient Details  Name: DAYVIN ABER MRN: [id number] Date of Birth: Feb 19, 1940  [facility] CM/SW Contact:  Zenon Mayo, RN Phone Number: 08/24/2022, 11:20 AM   Clinical Narrative:    Patient is for dc today, patient is active with [facility] and would like to stay with [facility]. Per previous NCM note. NCM notified Malachy Mood with [facility], she statea they will take care of it.  Per Staff RN ,did education with patient and daughter who is a Marine scientist ,on how to drain the biliary drain also.  Patient has transport home.  He is set up with [facility] for [place], [place].  Soc will begin 24 to 48 hrs post dc.  Patient will not be dc today, he is not eating well.  Will plan for tomorrow dc.      Barriers to Discharge: Continued Medical Work up   Patient Goals and CMS Choice Patient states their goals for this hospitalization and ongoing recovery are:: to go home CMS Medicare.gov Compare Post Acute Care list provided to:: Patient    Discharge Placement                       Discharge Plan and Services   Discharge Planning Services: CM Consult                                 Social Determinants of Health (SDOH) Interventions     Readmission Risk Interventions    08/15/2022    9:25 AM  Readmission Risk Prevention Plan  Transportation Screening Complete  PCP or Specialist Appt within 5-7 Days Complete  Home Care Screening Complete  Medication Review (RN CM) Complete

## 2022-08-24 NOTE — Progress Notes (Signed)
Discussed with patient's daughter she is concerned about going home today due to patient's poor oral intake. Consult dietitian Monitor 1 more day, to ensure no hypoglycemia or uncontrolled hyperglycemia

## 2022-08-25 ENCOUNTER — Other Ambulatory Visit (HOSPITAL_COMMUNITY): Payer: Self-pay

## 2022-08-25 DIAGNOSIS — K810 Acute cholecystitis: Secondary | ICD-10-CM | POA: Diagnosis not present

## 2022-08-25 LAB — BASIC METABOLIC PANEL
Anion gap: 8 (ref 5–15)
BUN: 11 mg/dL (ref 8–23)
CO2: 22 mmol/L (ref 22–32)
Calcium: 8.5 mg/dL — ABNORMAL LOW (ref 8.9–10.3)
Chloride: 103 mmol/L (ref 98–111)
Creatinine, Ser: 1.18 mg/dL (ref 0.61–1.24)
GFR, Estimated: >60 mL/min (ref >60)
Glucose, Bld: 184 mg/dL — ABNORMAL HIGH (ref 70–99)
Potassium: 4.4 mmol/L (ref 3.5–5.1)
Sodium: 133 mmol/L — ABNORMAL LOW (ref 135–145)

## 2022-08-25 LAB — GLUCOSE, CAPILLARY
Glucose-Capillary: 176 mg/dL — ABNORMAL HIGH (ref 70–99)
Glucose-Capillary: 221 mg/dL — ABNORMAL HIGH (ref 70–99)
Glucose-Capillary: 196 mg/dL — ABNORMAL HIGH (ref 70–99)
Glucose-Capillary: 240 mg/dL — ABNORMAL HIGH (ref 70–99)

## 2022-08-25 LAB — CBC
HCT: 35.5 % — ABNORMAL LOW (ref 39.0–52.0)
Hemoglobin: 11.8 g/dL — ABNORMAL LOW (ref 13.0–17.0)
MCH: 27.4 pg (ref 26.0–34.0)
MCHC: 33.2 g/dL (ref 30.0–36.0)
MCV: 82.6 fL (ref 80.0–100.0)
Platelets: 376 10*3/uL (ref 150–400)
RBC: 4.3 MIL/uL (ref 4.22–5.81)
RDW: 14.8 % (ref 11.5–15.5)
WBC: 11.6 10*3/uL — ABNORMAL HIGH (ref 4.0–10.5)
nRBC: 0 % (ref 0.0–0.2)

## 2022-08-25 MED ORDER — HYDROCODONE-ACETAMINOPHEN 5-325 MG PO TABS
1.0000 | ORAL_TABLET | ORAL | Status: DC | PRN
Start: 2022-08-25 — End: 2022-08-25

## 2022-08-25 MED ORDER — ADULT MULTIVITAMIN W/MINERALS CH
1.0000 | ORAL_TABLET | Freq: Every day | ORAL | Status: DC
Start: 2022-08-25 — End: 2022-08-28
  Administered 2022-08-25 – 2022-08-28 (×4): 1 via ORAL
  Filled 2022-08-25 (×4): qty 1

## 2022-08-25 MED ORDER — BENEPROTEIN PO POWD
1.0000 | Freq: Three times a day (TID) | ORAL | Status: DC
Start: 2022-08-26 — End: 2022-08-28
  Administered 2022-08-26 – 2022-08-28 (×5): 6 g via ORAL
  Filled 2022-08-25: qty 227

## 2022-08-25 MED ORDER — GLUCERNA 1.0 CAL/CARBSTEADY PO LIQD: 237.0000 mL | Freq: Every day | ORAL | 0 refills | Status: DC

## 2022-08-25 MED ORDER — INSULIN DETEMIR 100 UNIT/ML FLEXPEN: 8.0000 [IU] | PEN_INJECTOR | Freq: Every day | SUBCUTANEOUS | 0 refills | Status: DC

## 2022-08-25 MED ORDER — BENEPROTEIN PO POWD: 1.0000 | Freq: Three times a day (TID) | ORAL | 0 refills | Status: AC

## 2022-08-25 NOTE — Progress Notes (Addendum)
PROGRESS NOTE Andre Stout  GBT:[id number] DOB: [birth year] DOA: 08/14/2022 PCP: [name], FNP   Brief Narrative/Hospital Course: 82 y.o. M with HTN, prosCA, DM, and heart block (recently admitted for same due to severe hypokalemia) who presented with 3 days RUQ post-prandial painIn the ER, CT suggested acute cholecystitis.  Had leukocytosis.Gen Surg consulted.8/16: Cardiology consulted for recurrent heart block.  S/p percutaneous cholecystostomy tube placed. But on 8/19: Patient developed DKA, placed on insulin drip- Transitioned back to subQ insulin 8/20.8/21-22: Nausea and inability to take PO, sucralfate/PPI started.  Patient has been managed with IV Unasyn. For DM on Insulin 30 units daily and SSI, for HTN home lisinopril aspirin amlodipine. Blood sugar dropped to 37 8/24-insulin dose changed.  There was concern about dysphagia odynophagia GI was consulted-symptoms has improved with PPI and sucralfate no need for further work-up and signed off.  Can follow-up with outpatient GI.  Patient has been educated on drain care.  Patient completed 10 days of IV antibiotics.Planning for home health PT. surgery will be seeing him in 6 to 8 weeks to discuss about interval cholecystectomy.    Subjective: Seen this am Ate half of breakfast but did not eat lunch Daughter concerned about going home since his oral intake is poor and wanting to talk to dietitian  Assessment and Plan: Principal Problem:   Acute cholecystitis Active Problems:   Heart block AV complete (HCC)   Odynophagia   AKI (acute kidney injury) ([place])   Benign essential HTN   Diabetes mellitus without complication (HCC)   Acute hypokalemia   DKA, type 2 (HCC)   Hyponatremia   Obesity (BMI 30-39.9)   Myocardial injury   Assessment and Plan:  Acute cholecystitis:S/p percutaneous cholecystostomy tube -draining well, leukocytosis resolved, seen by IR and surgery, patient's daugher educated for drain care. He is having  poor oral intake- dietitian consulted- added glucerna, add MV, protein boost. He completed antibiotis Recent Labs  Lab 08/21/22 0429 08/22/22 0510 08/23/22 0419 08/24/22 0508 08/25/22 0541  WBC 10.9* 11.3* 10.2 10.0 11.6*    Odynophagia/? esophagitis, less likely candidiasis. Improved with ppi and carafate. Seen by GI and no further workup.  Dr [name] discussed with EP, if done in an inpatient setting, the patient's heart block should not preclude moderate sedation for endoscopy or general anesthesia for laparoscopy/laparotomy.  Complete heart block:Patient has low amplitude QRS and prominent P wave and the combination is being misidentified by the telemetry system as tachycardia.Marland Kitchen avoid nodal agents, f/u with cardiology outpatient  Myocardial injury:No chest pain, ischemia ruled out.  Class I Obesity w/ BMI 33: Will benefit with weight loss, PCP follow-up  Hyponatremia: Euvolemic and asymptomatic.stable. Recent Labs  Lab 08/21/22 0429 08/22/22 0510 08/23/22 0419 08/24/22 0508 08/25/22 0541  NA 141 138 139 132* 133*     DKA, type 2 Diabetes mellitus with uncontrolled hyperglycemia With hypoglycemia: Hypoglycemia from poor oral intake- so decreased insulin-sugar now stable fairly. But due to por intake- cont to monitor cbg and adjust insulin. He had DKA post op and had resolved needing insulin GTT Cont Semglee 5 units>cont SSI uptitrate insulin as needed Recent Labs  Lab 08/24/22 1633 08/24/22 2121 08/25/22 0642 08/25/22 1209 08/25/22 1603  GLUCAP 239* 186* 176* 221* 196*   Hypokalemia resolved Essential hypertension Bp stable on  home amlodipine lisinopril, off hydralazine AKI- resolved  DVT prophylaxis: enoxaparin (LOVENOX) injection 40 mg Start: 08/20/22 1800 Code Status:   Code Status: Full Code Family Communication: plan of care discussed with patient  at bedside. Patient status is: Inpatient because of ongoing post cholecystectomy percutaneous drain  management Level of care: Telemetry Cardiac   Dispo: The patient is from: home w/ grandson            Anticipated disposition: home w/ daughter once oral intake improves.  Daughter hesitant to go home today.  Mobility Assessment (last 72 hours)     Mobility Assessment     Row Name 08/25/22 1035 08/24/22 1934 08/24/22 0954 08/23/22 1700 08/23/22 0830   Does patient have an order for bedrest or is patient medically unstable No - Continue assessment No - Continue assessment -- -- No - Continue assessment   What is the highest level of mobility based on the progressive mobility assessment? Level 4 (Walks with assist in room) - Balance while marching in place and cannot step forward and back - Complete Level 4 (Walks with assist in room) - Balance while marching in place and cannot step forward and back - Complete Level 5 (Walks with assist in room/hall) - Balance while stepping forward/back and can walk in room with assist - Complete Level 5 (Walks with assist in room/hall) - Balance while stepping forward/back and can walk in room with assist - Complete Level 5 (Walks with assist in room/hall) - Balance while stepping forward/back and can walk in room with assist - Complete   Is the above level different from baseline mobility prior to current illness? -- No - Consider discontinuing PT/OT -- -- Yes - Recommend PT order             Objective: Vitals last 24 hrs: Vitals:   08/24/22 0330 08/24/22 1412 08/24/22 1934 08/25/22 0642  BP: (!) 116/57 122/66 117/64 135/70  Pulse:  61 62 62  Resp: 20 (!) 21 (!) 21 (!) 21  Temp: 97.9 F (36.6 C) 97.9 F (36.6 C) 97.9 F (36.6 C) 97.9 F (36.6 C)  TempSrc: Oral Oral Oral Oral  SpO2: 95% 96% 95%   Weight: 93.2 kg   92.1 kg  Height:       Weight change: -1.1 kg  Physical Examination: General exam: AAox3, older than stated age, weak appearing. HEENT:Oral mucosa moist, Ear/Nose WNL grossly, dentition normal. Respiratory system: bilaterally  diminished, no use of accessory muscle Cardiovascular system: S1 & S2 +, No JVD,. Gastrointestinal system: Abdomen soft, per drain +NT,ND,BS+ Nervous System:Alert, awake, moving extremities and grossly nonfocal Extremities: LE ankle edema neg, distal peripheral pulses palpable.  Skin: No rashes,no icterus. MSK: Normal muscle bulk,tone, power   Medications reviewed:  Scheduled Meds:  amLODipine  10 mg Oral Daily   aspirin  81 mg Oral QHS   brimonidine  1 drop Both Eyes BID WC   dorzolamide-timolol  1 drop Both Eyes BID WC   enoxaparin (LOVENOX) injection  40 mg Subcutaneous Q24H   feeding supplement  237 mL Oral BID BM   insulin aspart  0-5 Units Subcutaneous QHS   insulin aspart  0-9 Units Subcutaneous TID WC   insulin glargine-yfgn  5 Units Subcutaneous Daily   latanoprost  1 drop Both Eyes QHS   lisinopril  20 mg Oral Daily   multivitamin with minerals  1 tablet Oral Daily   pantoprazole  40 mg Oral BID AC   sodium chloride flush  5 mL Intracatheter Q8H   sucralfate  1 g Oral TID WC & HS   Continuous Infusions:      Diet Order  Diet Carb Modified Fluid consistency: Thin; Room service appropriate? Yes  Diet effective now                            Intake/Output Summary (Last 24 hours) at 08/25/2022 1713 Last data filed at 08/25/2022 1200 Gross per 24 hour  Intake 575 ml  Output 1811 ml  Net -1236 ml   Net IO Since Admission: 3,257.75 mL [08/25/22 1713]  Wt Readings from Last 3 Encounters:  08/25/22 92.1 kg  08/13/22 88.5 kg  08/01/22 88.9 kg     Unresulted Labs (From admission, onward)     Start     Ordered   08/23/22 7628  Basic metabolic panel  Daily at 5am,   R     Question:  Specimen collection method  Answer:  Lab=Lab collect   08/22/22 1049   08/23/22 0500  CBC  Daily at 5am,   R     Question:  Specimen collection method  Answer:  Lab=Lab collect   08/22/22 1049          Data Reviewed: I have personally reviewed following  labs and imaging studies CBC: Recent Labs  Lab 08/21/22 0429 08/22/22 0510 08/23/22 0419 08/24/22 0508 08/25/22 0541  WBC 10.9* 11.3* 10.2 10.0 11.6*  HGB 12.3* 12.3* 12.2* 11.9* 11.8*  HCT 37.5* 37.5* 36.8* 36.8* 35.5*  MCV 82.2 83.3 83.4 83.8 82.6  PLT 438* 386 406* 358 315   Basic Metabolic Panel: Recent Labs  Lab 08/21/22 0429 08/22/22 0510 08/23/22 0419 08/24/22 0508 08/25/22 0541  NA 141 138 139 132* 133*  K 3.2* 4.0 3.9 3.6 4.4  CL 110 110 110 101 103  CO2 22 22 21* 18* 22  GLUCOSE 86 98 42* 141* 184*  BUN 7* 7* 6* 7* 11  CREATININE 1.10 1.02 1.05 1.17 1.18  CALCIUM 8.5* 8.7* 8.6* 8.1* 8.5*   GFR: Estimated Creatinine Clearance: 51.2 mL/min (by C-G formula based on SCr of 1.18 mg/dL). Liver Function Tests: Recent Labs  Lab 08/19/22 1248 08/20/22 0726 08/21/22 0429 08/22/22 0510  AST 22 14* 15 27  ALT '11 9 8 12  '$ ALKPHOS 71 63 56 52  BILITOT 0.9 0.8 0.8 1.0  PROT 6.3* 5.6* 5.5* 5.6*  ALBUMIN 2.5* 2.2* 2.2* 2.3*   No results for input(s): "LIPASE", "AMYLASE" in the last 168 hours. No results for input(s): "AMMONIA" in the last 168 hours. Coagulation Profile: No results for input(s): "INR", "PROTIME" in the last 168 hours. BNP (last 3 results) No results for input(s): "PROBNP" in the last 8760 hours. HbA1C: No results for input(s): "HGBA1C" in the last 72 hours. CBG: Recent Labs  Lab 08/24/22 1633 08/24/22 2121 08/25/22 0642 08/25/22 1209 08/25/22 1603  GLUCAP 239* 186* 176* 221* 196*   Lipid Profile: No results for input(s): "CHOL", "HDL", "LDLCALC", "TRIG", "CHOLHDL", "LDLDIRECT" in the last 72 hours. Thyroid Function Tests: No results for input(s): "TSH", "T4TOTAL", "FREET4", "T3FREE", "THYROIDAB" in the last 72 hours. Sepsis Labs: No results for input(s): "PROCALCITON", "LATICACIDVEN" in the last 168 hours.  Recent Results (from the past 240 hour(s))  Aerobic/Anaerobic Culture w Gram Stain (surgical/deep wound)     Status: None    Collection Time: 08/17/22  5:23 PM   Specimen: PATH GI biopsy; Bile  Result Value Ref Range Status   Specimen Description BILE  Final   Special Requests NONE  Final   Gram Stain NO WBC SEEN NO ORGANISMS SEEN   Final  Culture   Final    No growth aerobically or anaerobically. Performed at [facility], [place] [address], [place], [place] [address]    Report Status 08/22/2022 FINAL  Final    Antimicrobials: Anti-infectives (From admission, onward)    Start     Dose/Rate Route Frequency Ordered Stop   08/20/22 1200  Ampicillin-Sulbactam (UNASYN) 3 g in sodium chloride 0.9 % 100 mL IVPB        3 g 200 mL/hr over 30 Minutes Intravenous Every 8 hours 08/20/22 1023 08/24/22 0539   08/14/22 2000  piperacillin-tazobactam (ZOSYN) IVPB 3.375 g  Status:  Discontinued        3.375 g 12.5 mL/hr over 240 Minutes Intravenous Every 8 hours 08/14/22 1516 08/20/22 0959   08/14/22 1145  piperacillin-tazobactam (ZOSYN) IVPB 3.375 g        3.375 g 12.5 mL/hr over 240 Minutes Intravenous  Once 08/14/22 1138 08/14/22 1632      Culture/Microbiology    Component Value Date/Time   SDES BILE 08/17/2022 1723   SPECREQUEST NONE 08/17/2022 1723   CULT  08/17/2022 1723    No growth aerobically or anaerobically. Performed at [facility], [place] [address], [place], [place] [address]    REPTSTATUS 08/22/2022 FINAL 08/17/2022 1723  Radiology Studies: No results found.   LOS: 11 days   Antonieta Pert, MD [facility]  08/25/2022, 5:13 PM

## 2022-08-25 NOTE — Progress Notes (Addendum)
We will keep the pt 1 more day per MD. Yolanda Bonine aware.

## 2022-08-25 NOTE — Progress Notes (Addendum)
Attempted to call Dietitian earlier, still waiting call back, not seen pt today.

## 2022-08-25 NOTE — Progress Notes (Signed)
Repage dietician on call, # listed in [place].

## 2022-08-25 NOTE — TOC Transition Note (Signed)
Transition of Care [facility]) - CM/SW Discharge Note   Patient Details  Name: Andre Stout MRN: [id number] Date of Birth: 06-08-1940  [facility]) CM/SW Contact:  Carles Collet, RN Phone Number: 08/25/2022, 11:07 AM   Clinical Narrative:     Notified [facility] of DC planned for today No DME needs. Family will provide transport per previous notes   Final next level of care: [place] Barriers to Discharge: No Barriers Identified   Patient Goals and CMS Choice Patient states their goals for this hospitalization and ongoing recovery are:: to go home CMS Medicare.gov Compare Post Acute Care list provided to:: Patient    Discharge Placement                       Discharge Plan and Services   Discharge Planning Services: CM Consult            DME Arranged: N/A         HH Arranged: RN, PT, OT [facility] Agency: [place] Date [facility] Agency Contacted: 08/25/22 Time [place]: 1107 Representative spoke with at [place]: [place] ([place]) Interventions     Readmission Risk Interventions    08/15/2022    9:25 AM  Readmission Risk Prevention Plan  Transportation Screening Complete  PCP or Specialist Appt within 5-7 Days Complete  Home Care Screening Complete  Medication Review (RN CM) Complete

## 2022-08-25 NOTE — Progress Notes (Signed)
Tried to paged dietitian again. Grandson in the room to pick transport pt. Per  night RN report,the daughter will bring pt back to ED if the dietitian will not  talk to the pt/family.   MD made aware of above  and ordered to ask CN to call [facility]  to know if there is another to call for on call dietitian today. Spoke  with [facility] and was advised to let MD call the daughter.  MD made aware that the only # we have for the dietitian is in [place]. Grandson left the building.

## 2022-08-25 NOTE — Plan of Care (Addendum)
Attempted to discuss dischare instructions with patient. However he prefers that they be discussed with his daughter or grandson.Attempted to call both daughter Sherrie Mustache as well as grandson Shawn with no answer. I left a VM with both callers.    Will attempt to provide dc instructions at a later time.    1230 Discussed discharge education with patients daughter, Sherrie Mustache. She would like to have a Dietary consult prior to patient being discharged due to his poor nutritional status and intake.  Dr. Maren Beach notified, dietary paged.

## 2022-08-26 DIAGNOSIS — K810 Acute cholecystitis: Secondary | ICD-10-CM | POA: Diagnosis not present

## 2022-08-26 LAB — BASIC METABOLIC PANEL
Anion gap: 12 (ref 5–15)
BUN: 11 mg/dL (ref 8–23)
CO2: 21 mmol/L — ABNORMAL LOW (ref 22–32)
Calcium: 8.6 mg/dL — ABNORMAL LOW (ref 8.9–10.3)
Chloride: 101 mmol/L (ref 98–111)
Creatinine, Ser: 1.17 mg/dL (ref 0.61–1.24)
GFR, Estimated: >60 mL/min (ref >60)
Glucose, Bld: 303 mg/dL — ABNORMAL HIGH (ref 70–99)
Potassium: 3.8 mmol/L (ref 3.5–5.1)
Sodium: 134 mmol/L — ABNORMAL LOW (ref 135–145)

## 2022-08-26 LAB — CBC
HCT: 36.9 % — ABNORMAL LOW (ref 39.0–52.0)
Hemoglobin: 12.5 g/dL — ABNORMAL LOW (ref 13.0–17.0)
MCH: 27.5 pg (ref 26.0–34.0)
MCHC: 33.9 g/dL (ref 30.0–36.0)
MCV: 81.3 fL (ref 80.0–100.0)
Platelets: 396 10*3/uL (ref 150–400)
RBC: 4.54 MIL/uL (ref 4.22–5.81)
RDW: 14.7 % (ref 11.5–15.5)
WBC: 12.9 10*3/uL — ABNORMAL HIGH (ref 4.0–10.5)
nRBC: 0 % (ref 0.0–0.2)

## 2022-08-26 LAB — GLUCOSE, CAPILLARY
Glucose-Capillary: 132 mg/dL — ABNORMAL HIGH (ref 70–99)
Glucose-Capillary: 160 mg/dL — ABNORMAL HIGH (ref 70–99)
Glucose-Capillary: 207 mg/dL — ABNORMAL HIGH (ref 70–99)
Glucose-Capillary: 227 mg/dL — ABNORMAL HIGH (ref 70–99)
Glucose-Capillary: 228 mg/dL — ABNORMAL HIGH (ref 70–99)
Glucose-Capillary: 313 mg/dL — ABNORMAL HIGH (ref 70–99)

## 2022-08-26 MED ORDER — INSULIN ASPART PROT & ASPART (70-30 MIX) 100 UNIT/ML ~~LOC~~ SUSP
20.0000 [IU] | Freq: Two times a day (BID) | SUBCUTANEOUS | Status: DC
  Administered 2022-08-26 – 2022-08-28 (×4): 20 [IU] via SUBCUTANEOUS
  Filled 2022-08-26: qty 10

## 2022-08-26 MED ORDER — INSULIN ASPART PROT & ASPART (70-30 MIX) 100 UNIT/ML ~~LOC~~ SUSP
12.0000 [IU] | Freq: Two times a day (BID) | SUBCUTANEOUS | Status: DC
Start: 2022-08-26 — End: 2022-08-26
  Administered 2022-08-26: 12 [IU] via SUBCUTANEOUS
  Filled 2022-08-26: qty 10

## 2022-08-26 MED ORDER — NEPRO/CARBSTEADY PO LIQD
237.0000 mL | Freq: Three times a day (TID) | ORAL | Status: DC
  Administered 2022-08-26 – 2022-08-27 (×2): 237 mL via ORAL

## 2022-08-26 MED ORDER — SODIUM CHLORIDE 0.9 % IV SOLN: INTRAVENOUS | Status: DC

## 2022-08-26 MED ORDER — INSULIN ASPART PROT & ASPART (70-30 MIX) 100 UNIT/ML ~~LOC~~ SUSP
15.0000 [IU] | Freq: Two times a day (BID) | SUBCUTANEOUS | Status: DC
Start: 2022-08-26 — End: 2022-08-26
  Filled 2022-08-26: qty 10

## 2022-08-26 NOTE — Progress Notes (Addendum)
Initial Nutrition Assessment  DOCUMENTATION CODES:   Not applicable  INTERVENTION:   Nepro Shake po TID, each supplement provides 425 kcal and 19 grams protein  MVI po daily   Pt at high refeed risk; recommend monitor potassium, magnesium and phosphorus labs daily until stable  NUTRITION DIAGNOSIS:   Inadequate oral intake related to acute illness as evidenced by meal completion < 25%.  GOAL:   Patient will meet greater than or equal to 90% of their needs  MONITOR:   PO intake, Supplement acceptance, Labs, Weight trends, Skin, I & O's  REASON FOR ASSESSMENT:   Consult Assessment of nutrition requirement/status  ASSESSMENT:   82 y/o male with h/o HTN, DM, heart block, MI and prostate cancer s/p seed implant (2016) who is admitted with cholecystits s/p IR drain 8/18 and DKA.  RD working remotely.  Spoke with pt via phone. Pt reports that his appetite has been poor over the past few weeks and in hospital. Pt reports that he did not eat much today. RN reports that pt did not eat much of his lunch. Pt is documented to be eating <25% of meals. RD discussed with patient the importance of adequate nutrition needed to support healing and preserve lean muscle. Pt reports that he has been drinking some of the Ensure supplements and he prefers the vanilla flavor. RD will change pt from Ensure to Nepro in setting of hyperglycemia. Recommend continue daily MVI. Can continue Beneprotein ordered by MD. Can consider appetite stimulant to help increase pt's oral intake. The other option would be temporary placement of cortrak feeding tube and nutrition support to see if this would help to increase pt's appetite. These options were discussed with MD. Pt remains at high refeed risk. Per chart, pt is down 14lbs(7%) over the past two weeks; this is significant weight loss. RD will follow up to obtain nutrition related exam.   Received message from RN. Pt's daughter is requesting to speak with RD  about her father's nutritional status; RN reports that they were unable to get in touch with RD yesterday. This RD called pt's daughter five times throughout the day today with no answer. Left VM message on daughter's phone. Will try to have an RD follow up with pt's daughter next week to answer her questions.   Medications reviewed and include: aspirin, lovenox, insulin, MVI, protonix, carafate  Labs reviewed: Na 134(L), K 3.8 wnl Wbc- 12.9(H) Cbgs- 228, 313 x 24 hrs AIC 8.3(H)- 8/3  NUTRITION - FOCUSED PHYSICAL EXAM: Unable to perform at this time   Diet Order:   Diet Order             Diet Carb Modified Fluid consistency: Thin; Room service appropriate? Yes  Diet effective now                  EDUCATION NEEDS:   Education needs have been addressed  Skin:  Skin Assessment: Reviewed RN Assessment (puncture abdomen s/p IR drain)  Last BM:  8/26- TYPE 4  Height:   Ht Readings from Last 1 Encounters:  08/16/22 '5\' 5"'$  (1.651 m)    Weight:   Wt Readings from Last 1 Encounters:  08/26/22 82.1 kg    Ideal Body Weight:  61.8 kg  BMI:  Body mass index is 30.1 kg/m.  Estimated Nutritional Needs:   Kcal:  1800-2100kcal/day  Protein:  90-105g/day  Fluid:  1.7-1.9L/day  Koleen Distance MS, RD, LDN Please refer to [facility] for RD and/or RD on-call/weekend/after hours  pager

## 2022-08-26 NOTE — Progress Notes (Signed)
PROGRESS NOTE Andre Stout  NIO:[id number] DOB: [birth year] DOA: 08/14/2022 PCP: [name], FNP   Brief Narrative/Hospital Course: 82 y.o. M with HTN, prosCA, DM, and heart block (recently admitted for same due to severe hypokalemia) who presented with 3 days RUQ post-prandial painIn the ER, CT suggested acute cholecystitis.  Had leukocytosis.Gen Surg consulted.8/16: Cardiology consulted for recurrent heart block.  S/p percutaneous cholecystostomy tube placed. But on 8/19: Patient developed DKA, placed on insulin drip- Transitioned back to subQ insulin 8/20.8/21-22: Nausea and inability to take PO, sucralfate/PPI started.  Patient has been managed with IV Unasyn. For DM on Insulin 30 units daily and SSI, for HTN home lisinopril aspirin amlodipine. Blood sugar dropped to 37 8/24-insulin dose changed.  There was concern about dysphagia odynophagia GI was consulted-symptoms has improved with PPI and sucralfate no need for further work-up and signed off.  Can follow-up with outpatient GI.  Patient's daughter has been educated on drain care.  Patient completed 10 days of IV antibiotics.Planning for home health PT. surgery will be seeing him in 6 to 8 weeks to discuss about interval cholecystectomy.  His oral intake has been consistent and family anxious/nervous about going home and sugar all remains fluctuating    Subjective: Seen and examined this morning is alert awake denies any new complaint.   Nursing reports he did not eat well during lunchtime.   His blood sugar has been trending up  Denies any dysphagia or throat pain Daughter informed that he was previously complaining of "Pain at the top of the stomach"  Assessment and Plan: Principal Problem:   Acute cholecystitis Active Problems:   Heart block AV complete ([place])   Odynophagia   AKI (acute kidney injury) ([place])   Benign essential HTN   Diabetes mellitus without complication (HCC)   Acute hypokalemia   DKA, type 2 (HCC)    Hyponatremia   Obesity (BMI 30-39.9)   Myocardial injury   Assessment and Plan:  Acute cholecystitis S/p percutaneous cholecystostomy tube: Patient encumber antibiotics, leukocytosis resolved, tube is draining well denies any abdominal pain. Seen by IR and surgery, patient's daugher educated for drain care. He will need close follow-up at the drain clinic and with general surgery for interval cholecystectomy. He is having poor oral intake- dietitian consulted- added glucerna,  MV, protein boost.  Mild bump in WBC count noted-could be due to dehydration start IV fluid hydration, he is afebrile.  Continue to monitor. Recent Labs  Lab 08/22/22 0510 08/23/22 0419 08/24/22 0508 08/25/22 0541 08/26/22 0332  WBC 11.3* 10.2 10.0 11.6* 12.9*   Poor oral intake: At risk of malnutrition, continue dietitian follow-up.  Continue supplement and augment nutritional status as above.  Encourage oral intake.  Odynophagia/? esophagitis, less likely candidiasis. Improved with ppi and carafate. Seen by GI and no further workup advised.  Dr [name] discussed with EP, if done in an inpatient setting, the patient's heart block should not preclude moderate sedation for endoscopy or general anesthesia for laparoscopy/laparotomy.  Complete heart block:Patient has low amplitude QRS and prominent P wave and the combination is being misidentified by the telemetry system as tachycardia.Marland Kitchen avoid nodal agents, f/u with cardiology outpatient  Myocardial injury:No chest pain, ischemia ruled out.  Class I Obesity w/ BMI 33: Will benefit with weight loss, PCP follow-up  Hyponatremia: Euvolemic and asymptomatic.stable. Recent Labs  Lab 08/22/22 0510 08/23/22 0419 08/24/22 0508 08/25/22 0541 08/26/22 0332  NA 138 139 132* 133* 134*    DKA, type 2 Diabetes mellitus with  uncontrolled hyperglycemia With hypoglycemia: Patient having uncontrolled hyperglycemia, also had hypoglycemia.  His oral intake has been  inconsistent.  Blood sugar trending up-normally on 70/30 insulin> resumed at lower dose and will adjust based on blood sugar continue SSI.He had DKA post op and was treated and resolved Recent Labs  Lab 08/25/22 1603 08/25/22 2103 08/26/22 0556 08/26/22 0900 08/26/22 1144  GLUCAP 196* 240* 313* 228* 227*   Hypokalemia resolved Essential hypertension Bp well controlled on amlodipine lisinopril.he is off hydralazine AKI- resolved  DVT prophylaxis: enoxaparin (LOVENOX) injection 40 mg Start: 08/20/22 1800 Code Status:   Code Status: Full Code Family Communication: plan of care discussed with patient at bedside. I had called and discussed with patient's daughter over the phone. Called again today Patient status is: Inpatient because of ongoing post cholecystectomy percutaneous drain management Level of care: Telemetry Cardiac   Dispo: The patient is from: home w/ grandson            Anticipated disposition: home w/ daughter once oral intake improves/sugar stabilizes, at risk for complication, malnutrition.  Mobility Assessment (last 72 hours)     Mobility Assessment     Row Name 08/25/22 2103 08/25/22 1035 08/24/22 1934 08/24/22 0954 08/23/22 1700   Does patient have an order for bedrest or is patient medically unstable No - Continue assessment No - Continue assessment No - Continue assessment -- --   What is the highest level of mobility based on the progressive mobility assessment? Level 4 (Walks with assist in room) - Balance while marching in place and cannot step forward and back - Complete Level 4 (Walks with assist in room) - Balance while marching in place and cannot step forward and back - Complete Level 4 (Walks with assist in room) - Balance while marching in place and cannot step forward and back - Complete Level 5 (Walks with assist in room/hall) - Balance while stepping forward/back and can walk in room with assist - Complete Level 5 (Walks with assist in room/hall) - Balance  while stepping forward/back and can walk in room with assist - Complete   Is the above level different from baseline mobility prior to current illness? No - Consider discontinuing PT/OT -- No - Consider discontinuing PT/OT -- --             Objective: Vitals last 24 hrs: Vitals:   08/25/22 2103 08/26/22 0551 08/26/22 0554 08/26/22 1139  BP: (!) 172/74  (!) 140/65 133/65  Pulse: 68  75   Resp: 18  (!) 22 (!) 22  Temp: 98.1 F (36.7 C)  97.9 F (36.6 C) 98.4 F (36.9 C)  TempSrc: Oral  Oral Oral  SpO2: 97%  95% 94%  Weight:  82.1 kg    Height:       Weight change: -10 kg  Physical Examination: General exam: AAox3, elderly, frail, older than stated age, weak appearing. HEENT:Oral mucosa moist, Ear/Nose WNL grossly, dentition normal. Respiratory system: bilaterally diminished, no use of accessory muscle Cardiovascular system: S1 & S2 +, No JVD,. Gastrointestinal system: Abdomen soft, percutaneous drain in place,NT,ND,BS+ Nervous System:Alert, awake, moving extremities and grossly nonfocal Extremities: LE ankle edema neg , distal peripheral pulses palpable.  Skin: No rashes,no icterus. MSK: Normal muscle bulk,tone, power   Medications reviewed:  Scheduled Meds:  amLODipine  10 mg Oral Daily   aspirin  81 mg Oral QHS   brimonidine  1 drop Both Eyes BID WC   dorzolamide-timolol  1 drop Both Eyes BID WC  enoxaparin (LOVENOX) injection  40 mg Subcutaneous Q24H   feeding supplement  237 mL Oral BID BM   insulin aspart  0-5 Units Subcutaneous QHS   insulin aspart  0-9 Units Subcutaneous TID WC   insulin aspart protamine- aspart  20 Units Subcutaneous BID WC   latanoprost  1 drop Both Eyes QHS   lisinopril  20 mg Oral Daily   multivitamin with minerals  1 tablet Oral Daily   pantoprazole  40 mg Oral BID AC   protein supplement  1 Scoop Oral TID WC   sodium chloride flush  5 mL Intracatheter Q8H   sucralfate  1 g Oral TID WC & HS  Continuous Infusions:   Diet Order              Diet Carb Modified Fluid consistency: Thin; Room service appropriate? Yes  Diet effective now                    Nutrition Problem: Inadequate oral intake Etiology: acute illness Signs/Symptoms: meal completion < 25%     Intake/Output Summary (Last 24 hours) at 08/26/2022 1448 Last data filed at 08/26/2022 1141 Gross per 24 hour  Intake 235 ml  Output 2050 ml  Net -1815 ml   Net IO Since Admission: 1,542.75 mL [08/26/22 1448]  Wt Readings from Last 3 Encounters:  08/26/22 82.1 kg  08/13/22 88.5 kg  08/01/22 88.9 kg     Unresulted Labs (From admission, onward)     Start     Ordered   08/23/22 8127  Basic metabolic panel  Daily at 5am,   R     Question:  Specimen collection method  Answer:  Lab=Lab collect   08/22/22 1049   08/23/22 0500  CBC  Daily at 5am,   R     Question:  Specimen collection method  Answer:  Lab=Lab collect   08/22/22 1049          Data Reviewed: I have personally reviewed following labs and imaging studies CBC: Recent Labs  Lab 08/22/22 0510 08/23/22 0419 08/24/22 0508 08/25/22 0541 08/26/22 0332  WBC 11.3* 10.2 10.0 11.6* 12.9*  HGB 12.3* 12.2* 11.9* 11.8* 12.5*  HCT 37.5* 36.8* 36.8* 35.5* 36.9*  MCV 83.3 83.4 83.8 82.6 81.3  PLT 386 406* 358 376 517   Basic Metabolic Panel: Recent Labs  Lab 08/22/22 0510 08/23/22 0419 08/24/22 0508 08/25/22 0541 08/26/22 0332  NA 138 139 132* 133* 134*  K 4.0 3.9 3.6 4.4 3.8  CL 110 110 101 103 101  CO2 22 21* 18* 22 21*  GLUCOSE 98 42* 141* 184* 303*  BUN 7* 6* 7* 11 11  CREATININE 1.02 1.05 1.17 1.18 1.17  CALCIUM 8.7* 8.6* 8.1* 8.5* 8.6*   GFR: Estimated Creatinine Clearance: 48.8 mL/min (by C-G formula based on SCr of 1.17 mg/dL). Liver Function Tests: Recent Labs  Lab 08/20/22 0726 08/21/22 0429 08/22/22 0510  AST 14* 15 27  ALT '9 8 12  '$ ALKPHOS 63 56 52  BILITOT 0.8 0.8 1.0  PROT 5.6* 5.5* 5.6*  ALBUMIN 2.2* 2.2* 2.3*   No results for input(s): "LIPASE",  "AMYLASE" in the last 168 hours. No results for input(s): "AMMONIA" in the last 168 hours. Coagulation Profile: No results for input(s): "INR", "PROTIME" in the last 168 hours. BNP (last 3 results) No results for input(s): "PROBNP" in the last 8760 hours. HbA1C: No results for input(s): "HGBA1C" in the last 72 hours. CBG: Recent Labs  Lab  08/25/22 1603 08/25/22 2103 08/26/22 0556 08/26/22 0900 08/26/22 1144  GLUCAP 196* 240* 313* 228* 227*   Lipid Profile: No results for input(s): "CHOL", "HDL", "LDLCALC", "TRIG", "CHOLHDL", "LDLDIRECT" in the last 72 hours. Thyroid Function Tests: No results for input(s): "TSH", "T4TOTAL", "FREET4", "T3FREE", "THYROIDAB" in the last 72 hours. Sepsis Labs: No results for input(s): "PROCALCITON", "LATICACIDVEN" in the last 168 hours.  Recent Results (from the past 240 hour(s))  Aerobic/Anaerobic Culture w Gram Stain (surgical/deep wound)     Status: None   Collection Time: 08/17/22  5:23 PM   Specimen: PATH GI biopsy; Bile  Result Value Ref Range Status   Specimen Description BILE  Final   Special Requests NONE  Final   Gram Stain NO WBC SEEN NO ORGANISMS SEEN   Final   Culture   Final    No growth aerobically or anaerobically. Performed at [facility], [place] [address] [place], [place] [address]    Report Status 08/22/2022 FINAL  Final    Antimicrobials: Anti-infectives (From admission, onward)    Start     Dose/Rate Route Frequency Ordered Stop   08/20/22 1200  Ampicillin-Sulbactam (UNASYN) 3 g in sodium chloride 0.9 % 100 mL IVPB        3 g 200 mL/hr over 30 Minutes Intravenous Every 8 hours 08/20/22 1023 08/24/22 0539   08/14/22 2000  piperacillin-tazobactam (ZOSYN) IVPB 3.375 g  Status:  Discontinued        3.375 g 12.5 mL/hr over 240 Minutes Intravenous Every 8 hours 08/14/22 1516 08/20/22 0959   08/14/22 1145  piperacillin-tazobactam (ZOSYN) IVPB 3.375 g        3.375 g 12.5 mL/hr over 240 Minutes Intravenous  Once  08/14/22 1138 08/14/22 1632      Culture/Microbiology    Component Value Date/Time   SDES BILE 08/17/2022 1723   SPECREQUEST NONE 08/17/2022 1723   CULT  08/17/2022 1723    No growth aerobically or anaerobically. Performed at [facility], [place] [address], [place], [place] [address]    REPTSTATUS 08/22/2022 FINAL 08/17/2022 1723  Radiology Studies: No results found.   LOS: 12 days   Antonieta Pert, MD [facility]  08/26/2022, 2:48 PM

## 2022-08-26 NOTE — Progress Notes (Signed)
Mobility Specialist Progress Note:   08/26/22 0941  Mobility  Activity Dangled on edge of bed;Stood at bedside  Level of Assistance Moderate assist, patient does 50-74%  Assistive Device Front wheel walker  Activity Response Tolerated poorly  $Mobility charge 1 Mobility   PT received in bed willing to participate in mobility. Complaints of left foot pain. MinA to get to EOB. ModA to stand and upon standing pt stating he's having 10/10 pain in left foot and can't remain standing. Left in bed with call bell in reach and all needs met.   [facility] Valari Taylor Mobility Specialist

## 2022-08-27 ENCOUNTER — Inpatient Hospital Stay (HOSPITAL_COMMUNITY): Payer: Medicare Other

## 2022-08-27 DIAGNOSIS — M25562 Pain in left knee: Secondary | ICD-10-CM | POA: Diagnosis not present

## 2022-08-27 DIAGNOSIS — K810 Acute cholecystitis: Secondary | ICD-10-CM | POA: Diagnosis not present

## 2022-08-27 LAB — GLUCOSE, CAPILLARY
Glucose-Capillary: 145 mg/dL — ABNORMAL HIGH (ref 70–99)
Glucose-Capillary: 224 mg/dL — ABNORMAL HIGH (ref 70–99)
Glucose-Capillary: 254 mg/dL — ABNORMAL HIGH (ref 70–99)
Glucose-Capillary: 90 mg/dL (ref 70–99)

## 2022-08-27 LAB — BASIC METABOLIC PANEL
Anion gap: 10 (ref 5–15)
BUN: 9 mg/dL (ref 8–23)
CO2: 21 mmol/L — ABNORMAL LOW (ref 22–32)
Calcium: 8.4 mg/dL — ABNORMAL LOW (ref 8.9–10.3)
Chloride: 105 mmol/L (ref 98–111)
Creatinine, Ser: 1.13 mg/dL (ref 0.61–1.24)
GFR, Estimated: >60 mL/min (ref >60)
Glucose, Bld: 201 mg/dL — ABNORMAL HIGH (ref 70–99)
Potassium: 3.4 mmol/L — ABNORMAL LOW (ref 3.5–5.1)
Sodium: 136 mmol/L (ref 135–145)

## 2022-08-27 LAB — CBC
HCT: 35 % — ABNORMAL LOW (ref 39.0–52.0)
Hemoglobin: 11.8 g/dL — ABNORMAL LOW (ref 13.0–17.0)
MCH: 27.3 pg (ref 26.0–34.0)
MCHC: 33.7 g/dL (ref 30.0–36.0)
MCV: 81 fL (ref 80.0–100.0)
Platelets: 353 10*3/uL (ref 150–400)
RBC: 4.32 MIL/uL (ref 4.22–5.81)
RDW: 14.6 % (ref 11.5–15.5)
WBC: 12.1 10*3/uL — ABNORMAL HIGH (ref 4.0–10.5)
nRBC: 0 % (ref 0.0–0.2)

## 2022-08-27 MED ORDER — MAGNESIUM OXIDE -MG SUPPLEMENT 400 (240 MG) MG PO TABS: 400.0000 mg | ORAL_TABLET | Freq: Every day | ORAL | 0 refills | Status: DC

## 2022-08-27 MED ORDER — DICLOFENAC SODIUM 1 % EX GEL
2.0000 g | Freq: Four times a day (QID) | CUTANEOUS | Status: DC | PRN
  Administered 2022-08-27 – 2022-08-28 (×2): 2 g via TOPICAL
  Filled 2022-08-27: qty 100

## 2022-08-27 MED ORDER — NEPRO/CARBSTEADY PO LIQD: 237.0000 mL | Freq: Three times a day (TID) | ORAL | 0 refills | Status: AC

## 2022-08-27 MED ORDER — INSULIN NPH ISOPHANE & REGULAR (70-30) 100 UNIT/ML ~~LOC~~ SUSP: 20.0000 [IU] | Freq: Two times a day (BID) | SUBCUTANEOUS | 11 refills | Status: DC

## 2022-08-27 MED ORDER — POTASSIUM CHLORIDE CRYS ER 20 MEQ PO TBCR
40.0000 meq | EXTENDED_RELEASE_TABLET | Freq: Once | ORAL | Status: AC
  Administered 2022-08-27: 40 meq via ORAL
  Filled 2022-08-27: qty 2

## 2022-08-27 MED ORDER — POTASSIUM CHLORIDE CRYS ER 10 MEQ PO TBCR: 10.0000 meq | EXTENDED_RELEASE_TABLET | Freq: Every day | ORAL | 0 refills | Status: DC

## 2022-08-27 NOTE — Progress Notes (Signed)
Physical Therapy Treatment Patient Details Name: Andre Stout MRN: [id number] DOB: 1940/08/05 Today's Date: 08/27/2022   History of Present Illness Pt presented 8/15 with new onset of RUQ abdominal pain.  Underwent percutaneous cholecystostomy tube placement on 8/18. 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    PT Comments    Pt received in bed, reports L knee pain with mvmt. Pt needed increased assist for mobility compared to previous session. Mod A +2 needed from PT and OT to come to EOB. Needed vc's for initiation of mobility and physical assist to complete task. Pt required max A +2 to come to standing EOB. Pt needed vc's as well as mod A +2 to step feet to recliner due to difficulty sequencing steps and continued knee pain. Pt unable to ambulate. Changing recommendation to SNF based on current level. If pt goes home, he will need +2 physical assist and 24 hr supervision. PT will continue to follow.    Recommendations for follow up therapy are one component of a multi-disciplinary discharge planning process, led by the attending physician.  Recommendations may be updated based on patient status, additional functional criteria and insurance authorization.  Follow Up Recommendations  Skilled nursing-short term rehab (<3 hours/day) Can patient physically be transported by private vehicle: No   Assistance Recommended at Discharge Frequent or constant Supervision/Assistance  Patient can return home with the following Assistance with cooking/housework;Help with stairs or ramp for entrance;Assist for transportation;Direct supervision/assist for financial management;Direct supervision/assist for medications management;Two people to help with walking and/or transfers;A lot of help with bathing/dressing/bathroom   Equipment Recommendations  None recommended by PT    Recommendations for Other Services       Precautions /  Restrictions Precautions Precautions: Fall Precaution Comments: R cholecystostomy drain Restrictions Weight Bearing Restrictions: No     Mobility  Bed Mobility Overal bed mobility: Needs Assistance Bed Mobility: Supine to Sit     Supine to sit: Mod assist, +2 for physical assistance     General bed mobility comments: pt needed vc's to initiate coming to EOB. Mod A to assist with LE's as well as mod A at trunk for transition SL to sit. Pt also needed mod A to scoot hips to EOB    Transfers Overall transfer level: Needs assistance Equipment used: Rolling walker (2 wheels) Transfers: Sit to/from Stand, Bed to chair/wheelchair/BSC Sit to Stand: Max assist, +2 physical assistance   Step pivot transfers: Mod assist, +2 physical assistance       General transfer comment: pt needed max A +2 for power up from bed. Performed multiple times for improvement but needed this level assist for power up each time. Pt needed max A with stand to sit as well due to L knee pain leading to uncontrolled sit. Pt needed vc's for each step from bed to chair, could not problem solve task independently    Ambulation/Gait               General Gait Details: unable due to knee pain and problem solving difficulties   Stairs             Wheelchair Mobility    Modified Rankin (Stroke Patients Only)       Balance Overall balance assessment: Needs assistance Sitting-balance support: No upper extremity supported, Feet supported Sitting balance-Leahy Scale: Fair     Standing balance support: Reliant on assistive device for balance, Bilateral upper extremity supported Standing balance-Leahy Scale: Poor  Standing balance comment: reliant on RW and external assist                            Cognition Arousal/Alertness: Awake/alert Behavior During Therapy: WFL for tasks assessed/performed, Flat affect Overall Cognitive Status: No family/caregiver present to determine baseline  cognitive functioning Area of Impairment: Safety/judgement, Attention, Following commands, Problem solving                   Current Attention Level: Selective Memory: Decreased short-term memory Following Commands: Follows one step commands with increased time Safety/Judgement: Decreased awareness of safety Awareness: Intellectual Problem Solving: Slow processing, Requires verbal cues General Comments: had difficulty sequencing mobility, needed specific instructional cues dueing transfers and increased time for processing        Exercises General Exercises - Lower Extremity Ankle Circles/Pumps: AROM, Seated, 5 reps (encouraged throughout day as edema present on BLE)    General Comments General comments (skin integrity, edema, etc.): VSS on RA, pt fatigued with all activity      Pertinent Vitals/Pain Pain Assessment Pain Assessment: Faces Faces Pain Scale: Hurts even more Pain Location: L knee Pain Descriptors / Indicators: Grimacing, Guarding, Aching Pain Intervention(s): Limited activity within patient's tolerance, Monitored during session    Home Living                          Prior Function            PT Goals (current goals can now be found in the care plan section) Acute Rehab PT Goals Patient Stated Goal: return home PT Goal Formulation: With patient Time For Goal Achievement: 09/02/22 Potential to Achieve Goals: Fair Progress towards PT goals: Not progressing toward goals - comment (knee pain, weakness)    Frequency    Min 3X/week      PT Plan Discharge plan needs to be updated    Co-evaluation PT/OT/SLP Co-Evaluation/Treatment: Yes Reason for Co-Treatment: Complexity of the patient's impairments (multi-system involvement);Necessary to address cognition/behavior during functional activity;For patient/therapist safety PT goals addressed during session: Mobility/safety with mobility;Balance;Proper use of DME;Strengthening/ROM         AM-PAC PT "6 Clicks" Mobility   Outcome Measure  Help needed turning from your back to your side while in a flat bed without using bedrails?: None Help needed moving from lying on your back to sitting on the side of a flat bed without using bedrails?: A Little Help needed moving to and from a bed to a chair (including a wheelchair)?: A Little Help needed standing up from a chair using your arms (e.g., wheelchair or bedside chair)?: A Little Help needed to walk in hospital room?: A Little Help needed climbing 3-5 steps with a railing? : A Lot 6 Click Score: 18    End of Session Equipment Utilized During Treatment: Gait belt Activity Tolerance: Patient tolerated treatment well Patient left: with call bell/phone within reach;in chair Nurse Communication: Mobility status PT Visit Diagnosis: Other abnormalities of gait and mobility (R26.89);Muscle weakness (generalized) (M62.81)     Time: 7989-2119 PT Time Calculation (min) (ACUTE ONLY): 24 min  Charges:  $Therapeutic Activity: 8-22 mins                     Leighton Roach, PT  Acute Rehab Services Secure chat preferred Office [place] 08/27/2022, 2:43 PM

## 2022-08-27 NOTE — Progress Notes (Signed)
Occupational Therapy Treatment Patient Details Name: Andre Stout MRN: [id number] DOB: [birth year] Today's Date: 08/27/2022   History of present illness Pt presented 8/15 with new onset of RUQ abdominal pain.  Underwent percutaneous cholecystostomy tube placement on 8/18. 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   OT comments  Pt not progressing towards goals this session, limited by L knee pain. Pt needing max A for LB ADL, mod A +2 for bed mobility and mod-max A+2 for standing and step pivot transfer to chair using RW. Pt with decreased cognition, slow processing and sequencing of mobility tasks, frequently asking "what do I do now" when already given instructions for session. Pt presenting with impairments listed below, will follow acutely. Updating d/c recommendation to SNF, if family/pt declining, pt will need 24/7 assistance and HHOT.   Recommendations for follow up therapy are one component of a multi-disciplinary discharge planning process, led by the attending physician.  Recommendations may be updated based on patient status, additional functional criteria and insurance authorization.    Follow Up Recommendations  Skilled nursing-short term rehab (<3 hours/day)    Assistance Recommended at Discharge Frequent or constant Supervision/Assistance  Patient can return home with the following  Two people to help with walking and/or transfers;A lot of help with bathing/dressing/bathroom;Assistance with cooking/housework;Direct supervision/assist for medications management;Direct supervision/assist for financial management;Assist for transportation;Help with stairs or ramp for entrance   Equipment Recommendations  None recommended by OT (pt has all needed DME)    Recommendations for Other Services PT consult    Precautions / Restrictions Precautions Precautions: Fall Precaution Comments: R cholecystostomy  drain Restrictions Weight Bearing Restrictions: No       Mobility Bed Mobility Overal bed mobility: Needs Assistance Bed Mobility: Supine to Sit     Supine to sit: Mod assist, +2 for physical assistance     General bed mobility comments: assist with use of pad, and for trunk elevation    Transfers Overall transfer level: Needs assistance Equipment used: Rolling walker (2 wheels) Transfers: Sit to/from Stand, Bed to chair/wheelchair/BSC Sit to Stand: Max assist, +2 physical assistance     Step pivot transfers: Mod assist, +2 physical assistance     General transfer comment: cues to sequence steps     Balance Overall balance assessment: Needs assistance Sitting-balance support: No upper extremity supported, Feet supported Sitting balance-Leahy Scale: Fair     Standing balance support: Reliant on assistive device for balance, Bilateral upper extremity supported Standing balance-Leahy Scale: Poor Standing balance comment: reliant on RW and external assist                           ADL either performed or assessed with clinical judgement   ADL                       Lower Body Dressing: Maximal assistance;Sitting/lateral leans Lower Body Dressing Details (indicate cue type and reason): to don socks Toilet Transfer: Moderate assistance;+2 for physical assistance Toilet Transfer Details (indicate cue type and reason): simulated         Functional mobility during ADLs: Maximal assistance;+2 for physical assistance      Extremity/Trunk Assessment Upper Extremity Assessment Upper Extremity Assessment: Overall WFL for tasks assessed   Lower Extremity Assessment Lower Extremity Assessment: Defer to PT evaluation        Vision   Vision Assessment?: No apparent visual deficits Additional  Comments: glaucoma hx   Perception Perception Perception: Not tested   Praxis Praxis Praxis: Not tested    Cognition Arousal/Alertness:  Awake/alert Behavior During Therapy: WFL for tasks assessed/performed, Flat affect Overall Cognitive Status: No family/caregiver present to determine baseline cognitive functioning Area of Impairment: Safety/judgement, Attention, Following commands, Problem solving                   Current Attention Level: Selective Memory: Decreased short-term memory Following Commands: Follows one step commands with increased time Safety/Judgement: Decreased awareness of safety Awareness: Intellectual Problem Solving: Slow processing, Requires verbal cues General Comments: slow processing, increased time to sequence activity        Exercises      Shoulder Instructions       General Comments VSS on RA    Pertinent Vitals/ Pain       Pain Assessment Pain Assessment: Faces Pain Score: 6  Faces Pain Scale: Hurts even more Pain Location: L knee with standing Pain Descriptors / Indicators: Grimacing, Guarding, Aching Pain Intervention(s): Limited activity within patient's tolerance, Repositioned  Home Living                                          Prior Functioning/Environment              Frequency  Min 2X/week        Progress Toward Goals  OT Goals(current goals can now be found in the care plan section)  Progress towards OT goals: Not progressing toward goals - comment (limited by L knee pain)  Acute Rehab OT Goals Patient Stated Goal: to decrease pain OT Goal Formulation: With patient Time For Goal Achievement: 09/03/22 Potential to Achieve Goals: Good ADL Goals Pt Will Perform Grooming: standing;with supervision Pt Will Perform Upper Body Dressing: with supervision;sitting Pt Will Perform Lower Body Dressing: with min assist;sit to/from stand;sitting/lateral leans Pt Will Transfer to Toilet: with min guard assist;regular height toilet;ambulating Pt Will Perform Toileting - Clothing Manipulation and hygiene: with supervision;sit to/from  stand Pt Will Perform Tub/Shower Transfer: Tub transfer;Shower transfer;shower seat;ambulating;with min assist Additional ADL Goal #1: Pt will recall and complete 3 step trail making task with no more than supervision using compensatory techniques as needed.  Plan Discharge plan needs to be updated;Frequency remains appropriate    Co-evaluation    PT/OT/SLP Co-Evaluation/Treatment: Yes Reason for Co-Treatment: Complexity of the patient's impairments (multi-system involvement);To address functional/ADL transfers;For patient/therapist safety PT goals addressed during session: Mobility/safety with mobility;Balance;Proper use of DME;Strengthening/ROM        AM-PAC OT "6 Clicks" Daily Activity     Outcome Measure   Help from another person eating meals?: None Help from another person taking care of personal grooming?: A Little Help from another person toileting, which includes using toliet, bedpan, or urinal?: A Lot Help from another person bathing (including washing, rinsing, drying)?: A Lot Help from another person to put on and taking off regular upper body clothing?: A Little Help from another person to put on and taking off regular lower body clothing?: Total 6 Click Score: 15    End of Session Equipment Utilized During Treatment: Rolling walker (2 wheels);Gait belt  OT Visit Diagnosis: Other abnormalities of gait and mobility (R26.89);Muscle weakness (generalized) (M62.81);Other symptoms and signs involving cognitive function   Activity Tolerance Patient tolerated treatment well   Patient Left with call bell/phone within reach;in bed;with bed  alarm set   Nurse Communication Mobility status        Time: [phone] OT Time Calculation (min): 27 min  Charges: OT General Charges $OT Visit: 1 Visit OT Treatments $Therapeutic Activity: 8-22 mins  Lynnda Child, OTD, OTR/L Acute Rehab [phone]) [phone] - [place] 08/27/2022, 3:04 PM

## 2022-08-27 NOTE — Progress Notes (Signed)
Family came to take patient home. They had a family meeting and now patient wants to stay overnight and work with PT and OT again tomorrow before discharging home.

## 2022-08-27 NOTE — Progress Notes (Signed)
Nutrition Follow-up  DOCUMENTATION CODES:   Not applicable  INTERVENTION:  Nepro Shake po TID, each supplement provides 425 kcal and 19 grams protein Carnation Instant Breakfast po TID - each supplement provides 140 kcal and 5g of protein  MVI with minerals daily Monitor magnesium, potassium, and phosphorus BID for at least 2 more days, MD to replete as needed, as pt is at risk for refeeding syndrome  NUTRITION DIAGNOSIS:   Moderate Malnutrition related to acute illness (cholecystitis) as evidenced by percent weight loss, mild fat depletion.  GOAL:   Patient will meet greater than or equal to 90% of their needs  Goal not met  MONITOR:   PO intake, Supplement acceptance, Labs, Weight trends, Skin, I & O's  REASON FOR ASSESSMENT:   Consult Assessment of nutrition requirement/status  ASSESSMENT:   82 y/o male with h/o HTN, DM, heart block, MI and prostate cancer s/p seed implant (2016) who is admitted with cholecystits s/p IR drain 8/18 and DKA.  Spoke with pt at bedside. No family at bedside. He states that he is not doing well today. He continued to request to go home during visit. Pt states that he has been drinking Nepro shakes as well as beneprotein. He recalls drinking 100% of 3 Nepro shakes this weekend.   RN present during visit. She states that he did not eat much over the weekend so was started on IV fluids. This morning he did not eat breakfast as he is constipated and did not want to eat. He had prune juice this morning to encourage a BM.   Spoke with patients daughter via phone call. Explained to her nutrition interventions in place and reasoning. She states that he does not usually like Ensure or Glucerna. He was started on Nepro over the weekend. She is uncertain if he has enjoyed these or has actually been consuming them but will follow up with him. She suspects he may like carnation instant breakfast. Will add these supplements in addition to Nepro to determine  which is he more in favor of.   Meal completions: 8/26: 50%-breakfast, 10%-lunch, 10%-dinner 8/27: 25%-lunch, 0%-dinner  Pt meets criteria for acute malnutrition given recent poor PO intake and weight loss however suspect there may be a degree of underlying chronic malnutrition.   Edema: moderate pitting BLE  Medications: SSI 0-5 units qhs, SSI 0-9 units TID, Novolog 70/30 20 units BID, MVI, carafate IV drips: NaCl $RemoveBef'@75ml'wvhOqacgwt$ /hr  Labs: potassium 3.4 (replacing), CBG's 132-227 x24 hours  UOP: 1265ml since admission I/O's: +1340ml since admission RUQ biliary drain: 134ml x12 hours  NUTRITION - FOCUSED PHYSICAL EXAM:  Flowsheet Row Most Recent Value  Orbital Region Mild depletion  Upper Arm Region Mild depletion  Thoracic and Lumbar Region No depletion  Buccal Region No depletion  Temple Region Moderate depletion  Clavicle Bone Region No depletion  Clavicle and Acromion Bone Region No depletion  Scapular Bone Region No depletion  Dorsal Hand No depletion  Patellar Region No depletion  Anterior Thigh Region Mild depletion  Posterior Calf Region No depletion  Edema (RD Assessment) None  Hair Reviewed  Eyes Reviewed  Mouth Reviewed  Skin Reviewed  Nails Reviewed       Diet Order:   Diet Order             Diet Carb Modified Fluid consistency: Thin; Room service appropriate? Yes  Diet effective now                   EDUCATION  NEEDS:   Education needs have been addressed  Skin:  Skin Assessment: Reviewed RN Assessment (puncture abdomen s/p IR drain)  Last BM:  8/26 (type 4)  Height:   Ht Readings from Last 1 Encounters:  08/16/22 5' 5"  (1.651 m)    Weight:   Wt Readings from Last 1 Encounters:  08/26/22 82.1 kg    Ideal Body Weight:  61.8 kg  BMI:  Body mass index is 30.1 kg/m.  Estimated Nutritional Needs:   Kcal:  1800-2100kcal/day  Protein:  90-105g/day  Fluid:  1.7-1.9L/day  Clayborne Dana, RDN, LDN Clinical Nutrition

## 2022-08-27 NOTE — Progress Notes (Signed)
Just attempted to get patient to bedside toilet and he was unable to stand and pivot. Requesting OT and PT again today.

## 2022-08-27 NOTE — Progress Notes (Signed)
Patient reports no upper gastric pain now. Pt states his left knee hurts when he tries to stand.  Nurse to ask MD   for topical gel.

## 2022-08-27 NOTE — Discharge Summary (Signed)
Physician Discharge Summary  Andre Stout:[id number] DOB: 1940-07-21 DOA: 08/14/2022  PCP: [name], FNP  Admit date: 08/14/2022 Discharge date: 08/28/2022 Recommendations for Outpatient Follow-up:  Follow up with PCP in 1 week-call for appointment Follow-up with the surgery team for interval cholecystectomy Please obtain BMP/CBC in one week-from primary care doctor  Discharge Dispo: [place] Discharge Condition: Stable Code Status:   Code Status: Full Code Diet recommendation:  Diet Order             Diet Carb Modified Fluid consistency: Thin; Room service appropriate? Yes  Diet effective now                    Brief/Interim Summary: 82 y.o. M with HTN, prosCA, DM, and heart block (recently admitted for same due to severe hypokalemia) who presented with 3 days RUQ post-prandial painIn the ER, CT suggested acute cholecystitis.  Had leukocytosis.Gen Surg consulted.8/16: Cardiology consulted for recurrent heart block.  S/p percutaneous cholecystostomy tube placed. But on 8/19: Patient developed DKA, placed on insulin drip- Transitioned back to subQ insulin 8/20.8/21-22: Nausea and inability to take PO, sucralfate/PPI started.  Patient has been managed with IV Unasyn. For DM on Insulin 30 units daily and SSI, for HTN home lisinopril aspirin amlodipine. Blood sugar dropped to 37 8/24-insulin dose changed.  There was concern about dysphagia odynophagia GI was consulted-symptoms has improved with PPI and sucralfate no need for further work-up and signed off.  Can follow-up with outpatient GI.  Patient's daughter has been educated on drain care.  Patient completed 10 days of IV antibiotics.Planning for home health PT. surgery will be seeing him in 6 to 8 weeks to discuss about interval cholecystectomy.  Patient hospitalized and prolonged for few more days due to his poor oral intake seen by dietitian added nutritional supplement, insulin was adjusted and back on 7030 regimen blood  sugar overall stable.  PT OT suggested skilled nursing facility for his deconditioning today, I discussed with the daughter in detail patient wants to go home does not want to go to skilled nursing facility and daughter wants to take him home Home health will be set up.  Of note he is at high risk for readmission, I have asked for a quick follow-up with PCP within a week-asked RN to see if he it can be set up from hospital, also informed the daughter to call the  PCP office.  Able to get early appointment with PCP coming Thursday.  Patient to stay additional day as family wanted him to work with PT/OT prior to taking him home with HHPTOT.  Discharge Diagnoses:  Principal Problem:   Acute cholecystitis Active Problems:   Heart block AV complete (HCC)   Odynophagia   AKI (acute kidney injury) ([place])   Benign essential HTN   Diabetes mellitus without complication (HCC)   Acute hypokalemia   DKA, type 2 (HCC)   Hyponatremia   Obesity (BMI 30-39.9)   Myocardial injury  Acute cholecystitis S/p percutaneous cholecystostomy tube: Patient encumber antibiotics, leukocytosis resolved, tube is draining well denies any abdominal pain.Seen by IR and surgery, patient's daugher educated for drain care. He will need close follow-up at the drain clinic and with general surgery for interval cholecystectomy.patient having poor oral intake- dietitian consulted-supplement has been added along with potassium chloride and magnesium oxide.  Patient remains afebrile no abdominal pain, or abdominal tenderness, drain working well 130 cc output past 24 hours.had mild bump in WBC count but now trending down.  Patient follow-up in a week and also with surgical team.  Poor oral intake/Odynophagia/? esophagitis, less likely candidiasis. Improved with ppi and carafate. Seen by GI and no further workup advised.At risk of malnutrition, seen dietitian added nutritional supplement, encourage oral intake.  Discussed with patient's  daughter in detail to encourage and support with feeding.    Dr [name] discussed with EP, if done in an inpatient setting, the patient's heart block should not preclude moderate sedation for endoscopy or general anesthesia for laparoscopy/laparotomy.   Complete heart block:Patient has low amplitude QRS and prominent P wave and the combination is being misidentified by the telemetry system as tachycardia.Marland Kitchen avoid nodal agents, f/u with cardiology outpatient Myocardial injury:No chest pain, ischemia ruled out. Class I Obesity w/ BMI 33: Will benefit with weight loss, PCP follow-up Hyponatremia: Euvolemic and asymptomatic.stable. DKA, type 2 Diabetes mellitus with uncontrolled hyperglycemia With hypoglycemia: Patient having uncontrolled hyperglycemia, also had hypoglycemia.  His oral intake has been inconsistent.  Blood sugar trending up-normally on 70/30 insulin> resumed at lower dose and blood sugar overall is stabilized, as his oral intake picks up his insulin dose needs to be adjusted, daughter has verbalized understanding.  Advised outpatient follow-up.   He had DKA post op and was treated and resolved Recent Labs  Lab 08/27/22 1642 08/27/22 2028 08/28/22 0309 08/28/22 0613 08/28/22 1128  GLUCAP 254* 90 92 158* 301*     Hypokalemia Cont po at home along with his magnesium  Essential hypertension Bp well controlled on amlodipine lisinopril.he is off hydralazine AKI- resolved Decondition debility continue home health PT OT, PT OT assisted skilled nursing facility but patient and family wants to go home with home health.  Explained that patient is high risk for readmission, will need close follow-up and monitoring . we have added arranged PCP follow-up on coming Thursday.  Consults: GI, surgery, cardiology Subjective: Alert awake oriented resting comfortably appears weak  Discharge Exam: Vitals:   08/27/22 1954 08/28/22 0314  BP: 129/67 129/71  Pulse: 75 74  Resp: 20 20  Temp:  98.8 F (37.1 C) 99.1 F (37.3 C)  SpO2: 96% 96%   General: Pt is alert, awake, oriented, pleasant not in acute distress Cardiovascular: RRR, S1/S2 +, no rubs, no gallops Respiratory: CTA bilaterally, no wheezing, no rhonchi Abdominal: Soft, NT, ND, bowel sounds + Extremities: no edema, no cyanosis, able to move both lower extremities nonfocal mildly tender on left no redness or effusion.  Discharge Instructions  Discharge Instructions     Discharge instructions   Complete by: As directed    Discharge instructions   Complete by: As directed    Please ensure patient is eating and drinking well at home including the supplement  Please call call MD or return to ER for similar or worsening recurring problem that brought you to hospital or if any fever,nausea/vomiting,abdominal pain, uncontrolled pain, chest pain,  shortness of breath or any other alarming symptoms.  Please follow-up your doctor as instructed in a week time and call the office for appointment. Please check cbc , bmp in a week  Please avoid alcohol, smoking, or any other illicit substance and maintain healthy habits including taking your regular medications as prescribed.  You were cared for by a hospitalist during your hospital stay. If you have any questions about your discharge medications or the care you received while you were in the hospital after you are discharged, you can call the unit and ask to speak with the hospitalist on call if the  hospitalist that took care of you is not available.  Once you are discharged, your primary care physician will handle any further medical issues. Please note that NO REFILLS for any discharge medications will be authorized once you are discharged, as it is imperative that you return to your primary care physician (or establish a relationship with a primary care physician if you do not have one) for your aftercare needs so that they can reassess your need for medications and monitor  your lab values   Discharge instructions   Complete by: As directed    You will need to follow-up with surgery to discuss about your gallbladder surgery Continued drain management Insulin dose has been decreased due to low blood sugar we will need to go up on the insulin dose so discusse with the primary care doctor Check blood sugar 3 times a day and bedtime at home. If blood sugar running above 200 less than 70 please call your MD to adjust insulin. If blood sugars running less 100 do not use insulin and call MD. If you noticed signs and symptoms of hypoglycemia or low blood sugar like jitteriness, confusion, thirst, tremor, sweating- Check blood sugar, drink sugary drink/biscuits/sweets to increase sugar level and call MD or return to ER.   Discharge wound care:   Complete by: As directed    Per IR   Discharge wound care:   Complete by: As directed    Cont drain and wound care per drain clinic  Patient to follow-up with IR clinic 10 to 14 days post DC for repeat imaging/possible drain injection, will need to flush drain daily with 5 cc NS record output daily, dressing change every 2 to 3 days ordered if soiled. Also Repeat Imaging/possible drain injection ocne output < 16m/QD   Increase activity slowly   Complete by: As directed       Allergies as of 08/28/2022   No Known Allergies      Medication List     TAKE these medications    Accu-Chek Guide w/Device Kit Use As Directed   acetaminophen 500 MG tablet Commonly known as: TYLENOL Take 1,000 mg by mouth daily as needed (pain).   amLODipine 10 MG tablet Commonly known as: NORVASC Take 1 tablet (10 mg total) by mouth daily.   aspirin 81 MG chewable tablet Chew 1 tablet (81 mg total) by mouth daily. What changed: when to take this   BD Insulin Syringe U/F 31G X 5/16" 1 ML Misc Generic drug: Insulin Syringe-Needle U-100 USE 2 DAILY   BD PosiFlush 0.9 % Soln injection Generic drug: sodium chloride flush Inject 5  mLs into the vein as needed for up to 30 doses. Discard remainder of syringe.   brimonidine 0.2 % ophthalmic solution Commonly known as: ALPHAGAN Place 1 drop into both eyes 2 (two) times daily with breakfast and lunch.   cetirizine 10 MG tablet Commonly known as: ZYRTEC Take 10 mg by mouth daily as needed (seasonal allergies).   dorzolamide-timolol 22.3-6.8 MG/ML ophthalmic solution Commonly known as: COSOPT Place 1 drop into both eyes 2 (two) times daily with breakfast and lunch.   feeding supplement (NEPRO CARB STEADY) Liqd Take 237 mLs by mouth 3 (three) times daily between meals for 7 days.   insulin NPH-regular Human (70-30) 100 UNIT/ML injection Inject 20 Units into the skin 2 (two) times daily with a meal. What changed: how much to take   latanoprost 0.005 % ophthalmic solution Commonly known as: XALATAN Place 1 drop  into both eyes at bedtime.   lisinopril 20 MG tablet Commonly known as: ZESTRIL Take 1 tablet (20 mg total) by mouth daily.   magnesium oxide 400 (240 Mg) MG tablet Commonly known as: MAG-OX Take 1 tablet (400 mg total) by mouth at bedtime.   OneTouch Delica Lancets 16S Misc Use to check blood sugar 4 times per day. Dx code: E11.9   OneTouch Ultra test strip Generic drug: glucose blood USE TO MONITOR GLUCOSE LEVELS 4 TIMES PER DAY E11.9   pantoprazole 40 MG tablet Commonly known as: PROTONIX Take 1 tablet (40 mg total) by mouth 2 (two) times daily before a meal.   potassium chloride 10 MEQ tablet Commonly known as: KLOR-CON M Take 1 tablet (10 mEq total) by mouth daily for 7 days.   PreserVision AREDS 2 Caps Take 1 capsule by mouth at bedtime.   protein supplement Powd Take 6 g by mouth 3 (three) times daily with meals.   sucralfate 1 GM/10ML suspension Commonly known as: CARAFATE Take 10 mLs (1 g total) by mouth 4 (four) times daily -  with meals and at bedtime.               Discharge Care Instructions  (From admission, onward)            Start     Ordered   08/28/22 0000  Discharge wound care:       Comments: Cont drain and wound care per drain clinic  Patient to follow-up with IR clinic 10 to 14 days post DC for repeat imaging/possible drain injection, will need to flush drain daily with 5 cc NS record output daily, dressing change every 2 to 3 days ordered if soiled. Also Repeat Imaging/possible drain injection ocne output < 78m/QD   08/28/22 1253   08/27/22 0000  Discharge wound care:       Comments: Per IR   08/27/22 1501            Follow-up Information     WRolm Bookbinder MD Follow up.   Specialty: General Surgery Why: 09/28/22, please arrive at 10:30. Contact information: [address] [place] [phone]        SSuzette Battiest MD Follow up.   Specialties: Interventional Radiology, Diagnostic Radiology, Radiology [place]: [place] 08/17/22. Our scheduler will call you to set up the appointment. If you have question about your appointment, please call scheduler at [phone] Contact information: [address] [place] [phone]        MMarrian Salvage FMedora Go on 09/07/2022.   Specialty: Internal Medicine Why: AUG. 31 AT 11:20AM Contact information: [address] [address] WCassie Freer MD .   Specialties: Cardiology, Internal Medicine, Radiology Contact information: [phone]         TEvans Lance MD .   Specialty: Cardiology Contact information: [phone]. [phone]         Care, AEwingFollow up.   Why: Agency will call you to set up apt times Contact information: [place] [phone]               No Known Allergies  The results of  significant diagnostics from this hospitalization (  including imaging, microbiology, ancillary and laboratory) are listed below for reference.    Microbiology: No results found for this or any previous visit (from the past 240 hour(s)).   Procedures/Studies: VAS [place] LOWER EXTREMITY VENOUS (DVT)  Result Date: 08/27/2022  Lower Venous DVT Study Patient Name:  ZYEIR DYMEK  Date of Exam:   08/27/2022 Medical Rec #: [id number]       Accession #:    [id number] Date of Birth: [birth year]      Patient Gender: M Patient Age:   29 years Exam Location:  [facility] Procedure:      VAS [place] LOWER EXTREMITY VENOUS (DVT) Referring Phys: [place] --------------------------------------------------------------------------------  Indications: Left knee pain.  Comparison Study: No previous exams Performing Technologist: Jody Hill RVT, RDMS  Examination Guidelines: A complete evaluation includes B-mode imaging, spectral Doppler, color Doppler, and power Doppler as needed of all accessible portions of each vessel. Bilateral testing is considered an integral part of a complete examination. Limited examinations for reoccurring indications may be performed as noted. The reflux portion of the exam is performed with the patient in reverse Trendelenburg.  +-----+---------------+---------+-----------+----------+--------------+ RIGHTCompressibilityPhasicitySpontaneityPropertiesThrombus Aging +-----+---------------+---------+-----------+----------+--------------+ CFV  Full           Yes      Yes                                 +-----+---------------+---------+-----------+----------+--------------+   +---------+---------------+---------+-----------+----------+--------------+ LEFT     CompressibilityPhasicitySpontaneityPropertiesThrombus Aging +---------+---------------+---------+-----------+----------+--------------+ CFV      Full           Yes      Yes                                  +---------+---------------+---------+-----------+----------+--------------+ SFJ      Full                                                        +---------+---------------+---------+-----------+----------+--------------+ FV Prox  Full           Yes      Yes                                 +---------+---------------+---------+-----------+----------+--------------+ FV Mid   Full           Yes      Yes                                 +---------+---------------+---------+-----------+----------+--------------+ FV DistalFull           Yes      Yes                                 +---------+---------------+---------+-----------+----------+--------------+ PFV      Full                                                        +---------+---------------+---------+-----------+----------+--------------+  POP      Full           Yes      Yes                                 +---------+---------------+---------+-----------+----------+--------------+ PTV      Full                                                        +---------+---------------+---------+-----------+----------+--------------+ PERO     Full                                                        +---------+---------------+---------+-----------+----------+--------------+     Summary: RIGHT: - No evidence of common femoral vein obstruction.  LEFT: - There is no evidence of deep vein thrombosis in the lower extremity.  - No cystic structure found in the popliteal fossa.  *See table(s) above for measurements and observations.    Preliminary    IR Perc Cholecystostomy  Result Date: 08/21/2022 INDICATION: 82 year old male with history of acute calculus cholecystitis, poor surgical candidate. EXAM: Fluoroscopic and ultrasound-guided cholecystostomy tube placement. MEDICATIONS: The patient was receiving intravenous antibiotics as an inpatient, no additional antibiotics were required.; The antibiotic was administered  within an appropriate time frame prior to the initiation of the procedure. ANESTHESIA/SEDATION: Moderate (conscious) sedation was employed during this procedure. A total of Versed 0 mg and Fentanyl 100 mcg was administered intravenously. Moderate Sedation Time: 11 minutes. The patient's level of consciousness and vital signs were monitored continuously by radiology nursing throughout the procedure under my direct supervision. FLUOROSCOPY TIME:  Sixteen mGy COMPLICATIONS: None immediate. PROCEDURE: Informed written consent was obtained from the patient after a thorough discussion of the procedural risks, benefits and alternatives. All questions were addressed. Maximal Sterile Barrier Technique was utilized including caps, mask, sterile gowns, sterile gloves, sterile drape, hand hygiene and skin antiseptic. A timeout was performed prior to the initiation of the procedure. The patient was placed supine on the angiographic table. The patient's right upper quadrant was then prepped and draped in normal sterile fashion with maximum sterile barrier. Ultrasound demonstrates a distended gallbladder. Subdermal Local anesthesia was provided at the planned skin entry site. Under ultrasound guidance, deeper local anesthetic was provided through intercostal muscles and along the liver capsule. Ultrasound was used to puncture the gallbladder using a 18 gauge trocar needle via a subhepatic/transperitoneal approach with visualization of the lung treated to the gallbladder. A 0.035 inch wire was advanced into the lumen and a transition dilator placed. A gentle hand injection of contrast was performed. Cholecystogram demonstrates irregular filling defects consistent with gallstones. The cystic duct is obstructed. A 0.035 inch exchange wire was placed in the tract was dilated. A 10.2 French multipurpose drainage catheter was advanced into the gallbladder lumen. The drain was then secured in place using a 0-silk suture and a Stayfix  device. A sterile dressing was applied. The tube was placed to bag drainage. A culture was sent to the lab for analysis. The patient tolerated procedure well without evidence of immediate complication was transferred back to  the floor in stable condition. IMPRESSION: Successful placement of percutaneous, subhepatic/transperitoneal cholecystostomy tube. Ruthann Cancer, MD [facility] Radiology Electronically Signed   By: Ruthann Cancer M.D.   On: 08/21/2022 16:35   ECHOCARDIOGRAM COMPLETE  Result Date: 08/16/2022    ECHOCARDIOGRAM REPORT   Patient Name:   RYOMA NOFZIGER Date of Exam: 08/16/2022 Medical Rec #:  [id number]      Height:       64.0 in Accession #:    [id number]     Weight:       183.2 lb Date of Birth:  [birth year]     BSA:          1.885 m Patient Age:    28 years       BP:           200/91 mmHg Patient Gender: M              HR:           65 bpm. Exam Location:  Inpatient Procedure: 2D Echo, Color Doppler, Cardiac Doppler and Intracardiac            Opacification Agent Indications:    Heart Block  History:        Patient has prior history of Echocardiogram examinations, most                 recent 01/03/2022. Risk Factors:Hypertension and Diabetes.  Sonographer:    Raquel Sarna Senior RDCS Referring Phys: [id number] Donato Heinz  Sonographer Comments: Suboptimal apical window. IMPRESSIONS  1. Left ventricular ejection fraction, by estimation, is 60 to 65%. The left ventricle has normal function. The left ventricle has no regional wall motion abnormalities. There is mild left ventricular hypertrophy. Left ventricular diastolic parameters are consistent with Grade I diastolic dysfunction (impaired relaxation).  2. Right ventricular systolic function is normal. The right ventricular size is normal. Tricuspid regurgitation signal is inadequate for assessing PA pressure.  3. Left atrial size was moderately dilated.  4. The mitral valve is abnormal. Trivial  mitral valve regurgitation.  5. The aortic valve was not well visualized. Aortic valve regurgitation is not visualized.  6. The inferior vena cava is normal in size with greater than 50% respiratory variability, suggesting right atrial pressure of 3 mmHg.  7. Cannot exclude a small PFO. Comparison(s): Prior images unable to be directly viewed, comparison made by report only. Changes from prior study are noted. 01/03/2022: LVEF 55-60%. FINDINGS  Left Ventricle: Left ventricular ejection fraction, by estimation, is 60 to 65%. The left ventricle has normal function. The left ventricle has no regional wall motion abnormalities. Definity contrast agent was given IV to delineate the left ventricular  endocardial borders. The left ventricular internal cavity size was normal in size. There is mild left ventricular hypertrophy. Left ventricular diastolic parameters are consistent with Grade I diastolic dysfunction (impaired relaxation). Indeterminate filling pressures. Right Ventricle: The right ventricular size is normal. No increase in right ventricular wall thickness. Right ventricular systolic function is normal. Tricuspid regurgitation signal is inadequate for assessing PA pressure. Left Atrium: Left atrial size was moderately dilated. Right Atrium: Right atrial size was normal in size. Pericardium: There is no evidence of pericardial effusion. Mitral Valve: The mitral valve is abnormal. There is mild thickening of the mitral valve leaflet(s). Trivial mitral valve regurgitation. Tricuspid Valve: The tricuspid valve is not well visualized. Tricuspid valve regurgitation is not demonstrated. Aortic Valve: The aortic valve was not well visualized. Aortic  valve regurgitation is not visualized. Pulmonic Valve: The pulmonic valve was not well visualized. Pulmonic valve regurgitation is not visualized. Aorta: The aortic root and ascending aorta are structurally normal, with no evidence of dilitation. Venous: The inferior vena  cava is normal in size with greater than 50% respiratory variability, suggesting right atrial pressure of 3 mmHg. IAS/Shunts: The interatrial septum is aneurysmal. Cannot exclude a small PFO.  LEFT VENTRICLE PLAX 2D LVIDd:         4.00 cm   Diastology LVIDs:         2.50 cm   LV e' medial:    4.79 cm/s LV PW:         1.00 cm   LV E/e' medial:  17.5 LV IVS:        1.10 cm   LV e' lateral:   7.51 cm/s LVOT diam:     2.10 cm   LV E/e' lateral: 11.2 LV SV:         67 LV SV Index:   35 LVOT Area:     3.46 cm  RIGHT VENTRICLE RV S prime:     11.70 cm/s TAPSE (M-mode): 1.8 cm LEFT ATRIUM             Index        RIGHT ATRIUM           Index LA diam:        3.70 cm 1.96 cm/m   RA Area:     19.30 cm LA Vol (A2C):   64.9 ml 34.43 ml/m  RA Volume:   57.10 ml  30.30 ml/m LA Vol (A4C):   95.8 ml 50.83 ml/m LA Biplane Vol: 78.8 ml 41.81 ml/m  AORTIC VALVE LVOT Vmax:   88.80 cm/s LVOT Vmean:  59.100 cm/s LVOT VTI:    0.193 m  AORTA Ao Root diam: 3.20 cm Ao Asc diam:  3.00 cm MITRAL VALVE MV Area (PHT): 3.85 cm    SHUNTS MV Decel Time: 197 msec    Systemic VTI:  0.19 m MV E velocity: 84.00 cm/s  Systemic Diam: 2.10 cm MV A velocity: 94.70 cm/s MV E/A ratio:  0.89 Lyman Bishop MD Electronically signed by Lyman Bishop MD Signature Date/Time: 08/16/2022/10:56:43 AM    Final    US Abdomen Limited RUQ (LIVER/GB)  Result Date: 08/14/2022 CLINICAL DATA:  Right upper quadrant pain and vomiting EXAM: ULTRASOUND ABDOMEN LIMITED RIGHT UPPER QUADRANT COMPARISON:  CT of earlier today FINDINGS: Gallbladder: Small gallstones. Wall thickening at 5 mm. Trace pericholecystic edema and fluid, including on CT. The technologist describes tenderness with gallbladder palpation. Common bile duct: Diameter: Normal, 5 mm Liver: No focal lesion identified. Within normal limits in parenchymal echogenicity. Portal vein is patent on color Doppler imaging with normal direction of blood flow towards the liver. Other: None. IMPRESSION: Cholelithiasis  with findings suspicious for acute cholecystitis. Electronically Signed   By: Abigail Miyamoto M.D.   On: 08/14/2022 10:43   CT ABDOMEN PELVIS W CONTRAST  Result Date: 08/14/2022 CLINICAL DATA:  Abdominal pain, constipation, and vomiting. EXAM: CT ABDOMEN AND PELVIS WITH CONTRAST TECHNIQUE: Multidetector CT imaging of the abdomen and pelvis was performed using the standard protocol following bolus administration of intravenous contrast. RADIATION DOSE REDUCTION: This exam was performed according to the departmental dose-optimization program which includes automated exposure control, adjustment of the mA and/or kV according to patient size and/or use of iterative reconstruction technique. CONTRAST:  123m OMNIPAQUE IOHEXOL 300 MG/ML  SOLN COMPARISON:  None Available. FINDINGS: Lower chest: The heart is enlarged and a few coronary artery calcifications are noted. Mild atelectasis or scarring is present at the lung bases. Hepatobiliary: No focal liver abnormality. No biliary ductal dilatation. Stones are present within the gallbladder. There is gallbladder wall thickening with pericholecystic edema and fat stranding Pancreas: Pancreatic atrophy. No pancreatic ductal dilatation or surrounding inflammatory changes. Spleen: Normal in size without focal abnormality. Adrenals/Urinary Tract: No adrenal nodule or mass. The kidneys enhance symmetrically. A large cyst with thin rim calcification is noted in the lower pole the right kidney measuring 9.8 cm. No renal calculus or hydronephrosis. The bladder is unremarkable. Stomach/Bowel: There is thickening of the walls of the distal esophagus and a small hiatal hernia is noted. The stomach is otherwise within normal limits. No bowel obstruction, free air, or pneumatosis. The appendix is not visualized on exam, however there are no inflammatory changes in the right lower quadrant. Vascular/Lymphatic: Aortic atherosclerosis. No abdominal or pelvic lymphadenopathy by size criteria.  Reproductive: Metallic densities are present in the prostate gland, possibly representing radiation therapy seeds. Other: Fat containing left inguinal and umbilical hernias are noted. No ascites. Musculoskeletal: Increased density is noted in the retroareolar regions bilaterally, greater on the right than on the left which may be due to patient positioning, possible gynecomastia. Degenerative changes are present in the thoracolumbar spine. No acute osseous abnormality. IMPRESSION: 1. Cholelithiasis with gallbladder wall thickening and surrounding inflammatory changes, possible acute cholecystitis. Ultrasound is recommended for further evaluation. 2. Thickening of the walls of the distal esophagus with small hiatal hernia. Endoscopy is suggested for further evaluation on follow-up. 3. Right renal cyst. 4. Aortic atherosclerosis. Electronically Signed   By: Brett Fairy M.D.   On: 08/14/2022 04:55   MR BRAIN W WO CONTRAST  Result Date: 08/01/2022 CLINICAL DATA:  Abnormal CT with possible meningioma or schwannoma EXAM: MRI HEAD WITHOUT AND WITH CONTRAST TECHNIQUE: Multiplanar, multiecho pulse sequences of the brain and surrounding structures were obtained without and with intravenous contrast. CONTRAST:  99m GADAVIST GADOBUTROL 1 MMOL/ML IV SOLN COMPARISON:  None Available. FINDINGS: Motion artifact is present. Brain: There is a dural-based enhancing extra-axial lesion of the right cerebellopontine angle cistern measuring about 1.8 x 1 x 2 cm. There is some extension into the internal auditory canal along the dura. The 7/8 nerve complex is poorly evaluated on these large field-of-view, motion degraded images. No significant mass effect on the adjacent brachium pontis. No parenchymal edema. There is no acute infarction or intracranial hemorrhage. There is no hydrocephalus or extra-axial fluid collection. Prominence of the ventricles and sulci reflects parenchymal volume loss. Patchy and confluent areas of T2  hyperintensity in the supratentorial and pontine white matter are nonspecific may reflect mild to moderate chronic microvascular ischemic changes. Vascular: Major vessel flow voids at the skull base are preserved. Skull and upper cervical spine: Normal marrow signal is preserved. Sinuses/Orbits: Paranasal sinus mucosal thickening. Bilateral lens replacements. Other: Sella is unremarkable.  Mastoid air cells are clear. 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 No significant mass effect or parenchymal edema. 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 Electronically Signed   By: PMacy MisM.D.   On: 08/01/2022 09:26   CT HEAD WO CONTRAST (5MM)  Result Date: 07/31/2022 CLINICAL DATA:  Possible seizure, memory loss. EXAM: CT HEAD WITHOUT CONTRAST TECHNIQUE: Contiguous axial images were obtained from the base of the skull through the vertex without intravenous contrast. RADIATION DOSE REDUCTION:  This exam was performed according to the departmental dose-optimization program which includes automated exposure control, adjustment of the mA and/or kV according to patient size and/or use of iterative reconstruction technique. COMPARISON:  01/02/2022. FINDINGS: Brain: No acute intracranial hemorrhage, midline shift or mass effect. No extra-axial fluid collection. Generalized atrophy is noted. Patchy and confluent white matter hypodensities are noted bilaterally and not significantly changed from the prior exam. A hyperdense structure is noted in the CP angle on the right measuring 1.6 x 0.8 cm. No hydrocephalus. Vascular: Atherosclerotic calcification of the carotid siphons. No hyperdense vessel. Skull: Normal. Negative for fracture or focal lesion. Sinuses/Orbits: Mucosal thickening is noted in the ethmoid air cells. The orbits are within normal limits. Other: Increased density is noted in the scalp over the parietal bone on the right, unchanged from  2021. IMPRESSION: 1. No acute intracranial hemorrhage. 2. Atrophy with chronic microvascular ischemic changes. 3. Dural-based asymmetry in the CP angle on the right, possible meningioma or schwannoma. MRI is recommended for further characterization on follow-up. Electronically Signed   By: Brett Fairy M.D.   On: 07/31/2022 21:18   DG Chest Port 1 View  Result Date: 07/31/2022 CLINICAL DATA:  Altered mental status/seizures EXAM: PORTABLE CHEST 1 VIEW COMPARISON:  Radiographs 10/09/2020 FINDINGS: Low lung volumes. Unchanged borderline enlargement of the cardiomediastinal silhouette. Aortic calcifications. Prominent fat pad at the cardiac apex. No focal consolidation, pleural effusion, or pneumothorax. IMPRESSION: No acute abnormality.  Hypoinflation. Electronically Signed   By: Placido Sou M.D.   On: 07/31/2022 20:39    Labs: BNP (last 3 results) Recent Labs    07/31/22 2013  BNP 161.0*   Basic Metabolic Panel: Recent Labs  Lab 08/23/22 0419 08/24/22 0508 08/25/22 0541 08/26/22 0332 08/27/22 0720  NA 139 132* 133* 134* 136  K 3.9 3.6 4.4 3.8 3.4*  CL 110 101 103 101 105  CO2 21* 18* 22 21* 21*  GLUCOSE 42* 141* 184* 303* 201*  BUN 6* 7* _0 CREATININE 1.05 1.17 1.18 1.17 1.13  CALCIUM 8.6* 8.1* 8.5* 8.6* 8.4*   Liver Function Tests: Recent Labs  Lab 08/22/22 0510  AST 27  ALT 12  ALKPHOS 52  BILITOT 1.0  PROT 5.6*  ALBUMIN 2.3*   No results for input(s): "LIPASE", "AMYLASE" in the last 168 hours. No results for input(s): "AMMONIA" in the last 168 hours. CBC: Recent Labs  Lab 08/23/22 0419 08/24/22 0508 08/25/22 0541 08/26/22 0332 08/27/22 0720  WBC 10.2 10.0 11.6* 12.9* 12.1*  HGB 12.2* 11.9* 11.8* 12.5* 11.8*  HCT 36.8* 36.8* 35.5* 36.9* 35.0*  MCV 83.4 83.8 82.6 81.3 81.0  PLT 406* 358 376 396 353   Cardiac Enzymes: No results for input(s): "CKTOTAL", "CKMB", "CKMBINDEX", "TROPONINI" in the last 168 hours. BNP: Invalid input(s):  "POCBNP" CBG: Recent Labs  Lab 08/27/22 1642 08/27/22 2028 08/28/22 0309 08/28/22 0613 08/28/22 1128  GLUCAP 254* 90 92 158* 301*   D-Dimer No results for input(s): "DDIMER" in the last 72 hours. Hgb A1c No results for input(s): "HGBA1C" in the last 72 hours. Lipid Profile No results for input(s): "CHOL", "HDL", "LDLCALC", "TRIG", "CHOLHDL", "LDLDIRECT" in the last 72 hours. Thyroid function studies No results for input(s): "TSH", "T4TOTAL", "T3FREE", "THYROIDAB" in the last 72 hours.  Invalid input(s): "FREET3" Anemia work up No results for input(s): "VITAMINB12", "FOLATE", "FERRITIN", "TIBC", "IRON", "RETICCTPCT" in the last 72 hours. Urinalysis    Component Value Date/Time   COLORURINE YELLOW 08/14/2022 [place]  08/14/2022 1531   LABSPEC 1.036 (H) 08/14/2022 1531   PHURINE 7.0 08/14/2022 1531   GLUCOSEU >=500 (A) 08/14/2022 1531   HGBUR NEGATIVE 08/14/2022 1531   BILIRUBINUR NEGATIVE 08/14/2022 1531   KETONESUR 5 (A) 08/14/2022 1531   PROTEINUR >=300 (A) 08/14/2022 1531   UROBILINOGEN 1.0 10/04/2009 1109   NITRITE NEGATIVE 08/14/2022 1531   LEUKOCYTESUR NEGATIVE 08/14/2022 1531   Sepsis Labs Recent Labs  Lab 08/24/22 0508 08/25/22 0541 08/26/22 0332 08/27/22 0720  WBC 10.0 11.6* 12.9* 12.1*   Microbiology No results found for this or any previous visit (from the past 240 hour(s)).    Time coordinating discharge: 35 minutes  SIGNED: Antonieta Pert, MD  [facility] 08/28/2022, 12:54 PM  If 7PM-7AM, please contact night-coverage [email or web address]

## 2022-08-27 NOTE — Progress Notes (Signed)
Daughter reports her father stated that it hurts him to eat. Pain is located in the upper gastric region.Nurse will ask patient about this and assess him.

## 2022-08-27 NOTE — Progress Notes (Signed)
LLE venous duplex has been completed.   Results can be found under chart review under CV PROC. 08/27/2022 6:14 PM Odyssey Vasbinder RVT, RDMS

## 2022-08-27 NOTE — TOC Transition Note (Signed)
Transition of Care [facility]) - CM/SW Discharge Note   Patient Details  Name: Andre Stout MRN: [id number] Date of Birth: August 24, 1940  [facility]) CM/SW Contact:  Zenon Mayo, RN Phone Number: 08/27/2022, 3:57 PM   Clinical Narrative:    Patient is for dc today, patient is active with [facility].  NCM notified Malachy Mood with [facility]  Per Staff RN ,did education with patient and daughter who is a Marine scientist ,on how to drain the biliary drain also.  Patient has transport home.  He is set up with [facility] for [place].  Soc will begin 24 to 48 hrs post dc.     Final next level of care: [place] Barriers to Discharge: No Barriers Identified   Patient Goals and CMS Choice Patient states their goals for this hospitalization and ongoing recovery are:: to go home CMS Medicare.gov Compare Post Acute Care list provided to:: Patient    Discharge Placement                       Discharge Plan and Services   Discharge Planning Services: CM Consult            DME Arranged: N/A         HH Arranged: RN, PT, OT [facility] Agency: [place] Date [facility] Agency Contacted: 08/25/22 Time [place]: 1107 Representative spoke with at [place]: [place] ([place]) Interventions     Readmission Risk Interventions    08/15/2022    9:25 AM  Readmission Risk Prevention Plan  Transportation Screening Complete  PCP or Specialist Appt within 5-7 Days Complete  Home Care Screening Complete  Medication Review (RN CM) Complete

## 2022-08-27 NOTE — Progress Notes (Signed)
Daughter called and inquired about her father. States her phone does not accept private or blocked numbers so please call her from a hospital phone.

## 2022-08-28 ENCOUNTER — Telehealth: Payer: Self-pay | Admitting: Family

## 2022-08-28 DIAGNOSIS — K810 Acute cholecystitis: Secondary | ICD-10-CM | POA: Diagnosis not present

## 2022-08-28 LAB — GLUCOSE, CAPILLARY
Glucose-Capillary: 92 mg/dL (ref 70–99)
Glucose-Capillary: 158 mg/dL — ABNORMAL HIGH (ref 70–99)
Glucose-Capillary: 301 mg/dL — ABNORMAL HIGH (ref 70–99)

## 2022-08-28 NOTE — TOC Progression Note (Addendum)
Transition of Care [facility]) - Progression Note    Patient Details  Name: Andre Stout MRN: [id number] Date of Birth: 1940/11/15  Transition of Care [facility]) CM/SW Contact  Zenon Mayo, RN Phone Number: 08/28/2022, 9:46 AM  Clinical Narrative:    Per Doctor, daughter wants patient to be seen again today by physical therapy and occupational therapy before he is discharged.  Doctor states they do not want to take him to SNF.  This NCM has had patient set up with [facility] services from previous recs. Will await daughter decision.  NCM spoke with daughter at bedside, Andre Stout, she states they will need a light weight w/chair for patient.  NCM informed doctor, he states it is ok to order this for patient thru [employer] with verbal order. NCM asked duaghter, Andre Stout did she have a preference of a DME company, she states no, that [employer] is ok to supply for them.   NCM made referral thru [employer] for lightweight wheelchair for patient to be delivered to patient's room. To [employer].  Will await for w/chair to be delivered.   Expected Discharge Plan: Home/Self Care Barriers to Discharge: No Barriers Identified  Expected Discharge Plan and Services Expected Discharge Plan: Home/Self Care   Discharge Planning Services: CM Consult   Living arrangements for the past 2 months: Single Family Home Expected Discharge Date: 08/27/22               DME Arranged: N/A         HH Arranged: RN, PT, OT HH Agency: [place] Date HH Agency Contacted: 08/25/22 Time [place]: 1107 Representative spoke with at [place]: [place] ([place]) Interventions    Readmission Risk Interventions    08/15/2022    9:25 AM  Readmission Risk Prevention Plan  Transportation Screening Complete  PCP or Specialist Appt within 5-7 Days Complete  Home Care Screening Complete  Medication Review (RN CM) Complete

## 2022-08-28 NOTE — Progress Notes (Addendum)
PROGRESS NOTE Andre Stout  DGU:[id number] DOB: [birth year] DOA: 08/14/2022 PCP: [name], FNP   Brief Narrative/Hospital Course: 82 y.o. M with HTN, prosCA, DM, and heart block (recently admitted for same due to severe hypokalemia) who presented with 3 days RUQ post-prandial painIn the ER, CT suggested acute cholecystitis.  Had leukocytosis.Gen Surg consulted.8/16: Cardiology consulted for recurrent heart block.  S/p percutaneous cholecystostomy tube placed. But on 8/19: Patient developed DKA, placed on insulin drip- Transitioned back to subQ insulin 8/20.8/21-22: Nausea and inability to take PO, sucralfate/PPI started.  Patient has been managed with IV Unasyn. For DM on Insulin 30 units daily and SSI, for HTN home lisinopril aspirin amlodipine. Blood sugar dropped to 37 8/24-insulin dose changed.  There was concern about dysphagia odynophagia GI was consulted-symptoms has improved with PPI and sucralfate no need for further work-up and signed off.  Can follow-up with outpatient GI.  Patient's daughter has been educated on drain care.  Patient completed 10 days of IV antibiotics.Planning for home health PT. surgery will be seeing him in 6 to 8 weeks to discuss about interval cholecystectomy.  Patient hospitalized and prolonged for few more days due to his poor oral intake seen by dietitian added nutritional supplement, insulin was adjusted and back on 7030 regimen blood sugar overall stable.  PT OT suggested skilled nursing facility for his deconditioning today, I discussed with the daughter in detail patient wants to go home does not want to go to skilled nursing facility and daughter wants to take him home Home health will be set up.  Of note he is at high risk for readmission, I have asked for a quick follow-up with PCP within a week-asked RN to see if he it can be set up from hospital, also informed the daughter to call the  PCP office.    Subjective: Late entry Patient had no new  complaints  Was resting comfortably Some left knee pain, muscle weakness but no abdominal pain nausea vomiting. Worked with PT OT suggested skilled nursing facility but patient and family wanted to go home. Later in the evening family did not want to go home and wanted to work with PT OT again 8/29  Assessment and Plan: Principal Problem:   Acute cholecystitis Active Problems:   Heart block AV complete ([place])   Odynophagia   AKI (acute kidney injury) ([place])   Benign essential HTN   Diabetes mellitus without complication ([place])   Acute hypokalemia   DKA, type 2 ([facility])   Hyponatremia   Obesity (BMI 30-39.9)   Myocardial injury   Assessment and Plan:  Acute cholecystitis S/p percutaneous cholecystostomy tube: Patient encumber antibiotics, leukocytosis resolved, tube is draining well denies any abdominal pain.Seen by IR and surgery, patient's daugher educated for drain care. He will need close follow-up at the drain clinic and with general surgery for interval cholecystectomy.patient having poor oral intake- dietitian consulted-supplement has been added along with potassium chloride and magnesium oxide.  Patient remains afebrile no abdominal pain, or abdominal tenderness, drain working well 130 cc output past 24 hours.had mild bump in WBC count but now trending down.  Patient follow-up pcp, ir and [facility] team Recent Labs  Lab 08/23/22 0419 08/24/22 0508 08/25/22 0541 08/26/22 0332 08/27/22 0720  WBC 10.2 10.0 11.6* 12.9* 12.1*   Poor oral intake/At risk of malnutrition, seen dietitian added nutritional supplement, encourage oral intake.  Discussed with patient's daughter in detail to encourage and support with feeding.     Odynophagia/? esophagitis, less  likely candidiasis. Improved with ppi and carafate. Seen by GI and no further workup advised.  Dr [name] discussed with EP, if done in an inpatient setting, the patient's heart block should not preclude moderate sedation for endoscopy or  general anesthesia for laparoscopy/laparotomy.   Complete heart block:Patient has low amplitude QRS and prominent P wave and the combination is being misidentified by the telemetry system as tachycardia.Marland Kitchen avoid nodal agents, f/u with cardiology outpatient   Myocardial injury:No chest pain, ischemia ruled out.   Class I Obesity w/ BMI 33: Will benefit with weight loss, PCP follow-up   Hyponatremia: Euvolemic and asymptomatic.stable.   DKA, type 2 Diabetes mellitus with uncontrolled hyperglycemia With hypoglycemia: Patient having uncontrolled hyperglycemia, also had hypoglycemia.  His oral intake has been inconsistent.  Blood sugar trending up-normally on 70/30 insulin> resumed at lower dose and blood sugar overall is stabilized, as his oral intake picks up his insulin dose needs to be adjusted, daughter has verbalized understanding.Advised outpatient follow-up.   He had DKA post op and was treated and resolved Last Labs          Recent Labs  Lab 08/26/22 1636 08/26/22 2055 08/26/22 2358 08/27/22 0333 08/27/22 1130  GLUCAP 207* 160* 132* 145* 224*     Hypokalemia cont k dur on dc. Essential hypertension Bp well controlled on amlodipine lisinopril.he is off hydralazine AKI- resolved Class I Obesity w/ BMI 33: Will benefit with weight loss, PCP follow-up Left knee pain duplex done and negative for DVT suspect muscle/arthritis related  DVT prophylaxis: enoxaparin (LOVENOX) injection 40 mg Start: 08/20/22 1800 Code Status:   Code Status: Full Code Family Communication: plan of care discussed with patient at bedside. I had called and discussed with patient's daughter over the phone. Called again today Patient status is: Inpatient awaiting Dispo Level of care: Telemetry Cardiac  Dispo: The patient is from: home w/ grandson            Anticipated disposition: home w/ daughter with home health  , family refused to go and wants to work with PT OT prior to discharge on 8/29    Mobility  Assessment (last 72 hours)     Mobility Assessment     Row Name 08/27/22 2000 08/27/22 1455 08/27/22 1434 08/27/22 0755 08/26/22 1915   Does patient have an order for bedrest or is patient medically unstable No - Continue assessment -- -- No - Continue assessment No - Continue assessment   What is the highest level of mobility based on the progressive mobility assessment? Level 3 (Stands with assist) - Balance while standing  and cannot march in place Level 3 (Stands with assist) - Balance while standing  and cannot march in place Level 3 (Stands with assist) - Balance while standing  and cannot march in place Level 3 (Stands with assist) - Balance while standing  and cannot march in place Level 4 (Walks with assist in room) - Balance while marching in place and cannot step forward and back - Complete   Is the above level different from baseline mobility prior to current illness? Yes - Recommend PT order -- -- Yes - Recommend PT order --    [place] Name 08/26/22 0855 08/25/22 2103 08/25/22 1035       Does patient have an order for bedrest or is patient medically unstable No - Continue assessment No - Continue assessment No - Continue assessment     What is the highest level of mobility based on the progressive mobility  assessment? Level 4 (Walks with assist in room) - Balance while marching in place and cannot step forward and back - Complete Level 4 (Walks with assist in room) - Balance while marching in place and cannot step forward and back - Complete Level 4 (Walks with assist in room) - Balance while marching in place and cannot step forward and back - Complete     Is the above level different from baseline mobility prior to current illness? No - Consider discontinuing PT/OT No - Consider discontinuing PT/OT --               Objective: Vitals last 24 hrs: Vitals:   08/27/22 1953 08/27/22 1954 08/28/22 0314 08/28/22 0600  BP:  129/67 129/71   Pulse:  75 74   Resp: '18 20 20   '$ Temp:  98.8  F (37.1 C) 99.1 F (37.3 C)   TempSrc:  Oral Oral   SpO2:  96% 96%   Weight:    88.9 kg  Height:       Weight change:   Physical Examination: General: Pt is alert, awake, not in acute distress Cardiovascular: RRR, S1/S2 +, no rubs, no gallops Respiratory: CTA bilaterally, no wheezing, no rhonchi Abdominal: Soft, NT, ND, per drain+, bowel sounds + Extremities: no edema, no cyanosis  Medications reviewed:  Scheduled Meds:  amLODipine  10 mg Oral Daily   aspirin  81 mg Oral QHS   brimonidine  1 drop Both Eyes BID WC   dorzolamide-timolol  1 drop Both Eyes BID WC   enoxaparin (LOVENOX) injection  40 mg Subcutaneous Q24H   feeding supplement (NEPRO CARB STEADY)  237 mL Oral TID BM   insulin aspart  0-5 Units Subcutaneous QHS   insulin aspart  0-9 Units Subcutaneous TID WC   insulin aspart protamine- aspart  20 Units Subcutaneous BID WC   latanoprost  1 drop Both Eyes QHS   lisinopril  20 mg Oral Daily   multivitamin with minerals  1 tablet Oral Daily   pantoprazole  40 mg Oral BID AC   protein supplement  1 Scoop Oral TID WC   sodium chloride flush  5 mL Intracatheter Q8H   sucralfate  1 g Oral TID WC & HS  Continuous Infusions:   Diet Order             Diet Carb Modified Fluid consistency: Thin; Room service appropriate? Yes  Diet effective now                    Nutrition Problem: Moderate Malnutrition Etiology: acute illness (cholecystitis) Signs/Symptoms: percent weight loss, mild fat depletion Percent weight loss: 7 % Interventions: Nepro shake, MVI, Carnation Instant Breakfast   Intake/Output Summary (Last 24 hours) at 08/28/2022 0957 Last data filed at 08/28/2022 0530 Gross per 24 hour  Intake 1740.51 ml  Output 1310 ml  Net 430.51 ml   Net IO Since Admission: 1,945.2 mL [08/28/22 0957]  Wt Readings from Last 3 Encounters:  08/28/22 88.9 kg  08/13/22 88.5 kg  08/01/22 88.9 kg     Unresulted Labs (From admission, onward)    None     Data  Reviewed: I have personally reviewed following labs and imaging studies CBC: Recent Labs  Lab 08/23/22 0419 08/24/22 0508 08/25/22 0541 08/26/22 0332 08/27/22 0720  WBC 10.2 10.0 11.6* 12.9* 12.1*  HGB 12.2* 11.9* 11.8* 12.5* 11.8*  HCT 36.8* 36.8* 35.5* 36.9* 35.0*  MCV 83.4 83.8 82.6 81.3 81.0  PLT 406*  358 376 396 976   Basic Metabolic Panel: Recent Labs  Lab 08/23/22 0419 08/24/22 0508 08/25/22 0541 08/26/22 0332 08/27/22 0720  NA 139 132* 133* 134* 136  K 3.9 3.6 4.4 3.8 3.4*  CL 110 101 103 101 105  CO2 21* 18* 22 21* 21*  GLUCOSE 42* 141* 184* 303* 201*  BUN 6* 7* '11 11 9  '$ CREATININE 1.05 1.17 1.18 1.17 1.13  CALCIUM 8.6* 8.1* 8.5* 8.6* 8.4*   GFR: Estimated Creatinine Clearance: 52.6 mL/min (by C-G formula based on SCr of 1.13 mg/dL). Liver Function Tests: Recent Labs  Lab 08/22/22 0510  AST 27  ALT 12  ALKPHOS 52  BILITOT 1.0  PROT 5.6*  ALBUMIN 2.3*   No results for input(s): "LIPASE", "AMYLASE" in the last 168 hours. No results for input(s): "AMMONIA" in the last 168 hours. Coagulation Profile: No results for input(s): "INR", "PROTIME" in the last 168 hours. BNP (last 3 results) No results for input(s): "PROBNP" in the last 8760 hours. HbA1C: No results for input(s): "HGBA1C" in the last 72 hours. CBG: Recent Labs  Lab 08/27/22 1130 08/27/22 1642 08/27/22 2028 08/28/22 0309 08/28/22 0613  GLUCAP 224* 254* 90 92 158*   Lipid Profile: No results for input(s): "CHOL", "HDL", "LDLCALC", "TRIG", "CHOLHDL", "LDLDIRECT" in the last 72 hours. Thyroid Function Tests: No results for input(s): "TSH", "T4TOTAL", "FREET4", "T3FREE", "THYROIDAB" in the last 72 hours. Sepsis Labs: No results for input(s): "PROCALCITON", "LATICACIDVEN" in the last 168 hours.  No results found for this or any previous visit (from the past 240 hour(s)).   Antimicrobials: Anti-infectives (From admission, onward)    Start     Dose/Rate Route Frequency Ordered Stop    08/20/22 1200  Ampicillin-Sulbactam (UNASYN) 3 g in sodium chloride 0.9 % 100 mL IVPB        3 g 200 mL/hr over 30 Minutes Intravenous Every 8 hours 08/20/22 1023 08/24/22 0539   08/14/22 2000  piperacillin-tazobactam (ZOSYN) IVPB 3.375 g  Status:  Discontinued        3.375 g 12.5 mL/hr over 240 Minutes Intravenous Every 8 hours 08/14/22 1516 08/20/22 0959   08/14/22 1145  piperacillin-tazobactam (ZOSYN) IVPB 3.375 g        3.375 g 12.5 mL/hr over 240 Minutes Intravenous  Once 08/14/22 1138 08/14/22 1632      Culture/Microbiology    Component Value Date/Time   SDES BILE 08/17/2022 1723   SPECREQUEST NONE 08/17/2022 1723   CULT  08/17/2022 1723    No growth aerobically or anaerobically. Performed at [facility], [place] [address] [place], [place] [address]    REPTSTATUS 08/22/2022 FINAL 08/17/2022 1723  Radiology Studies: VAS [place] LOWER EXTREMITY VENOUS (DVT)  Result Date: 08/27/2022  Lower Venous DVT Study Patient Name:  ENMANUEL ZUFALL  Date of Exam:   08/27/2022 Medical Rec #: [id number]       Accession #:    [id number] Date of Birth: [birth year]      Patient Gender: M Patient Age:   17 years Exam Location:  [facility] Procedure:      VAS [place] LOWER EXTREMITY VENOUS (DVT) Referring Phys: [place] --------------------------------------------------------------------------------  Indications: Left knee pain.  Comparison Study: No previous exams Performing Technologist: Jody Hill RVT, RDMS  Examination Guidelines: A complete evaluation includes B-mode imaging, spectral Doppler, color Doppler, and power Doppler as needed of all accessible portions of each vessel. Bilateral testing is considered an integral part of a complete examination. Limited examinations for  reoccurring indications may be performed as noted. The reflux portion of the exam is performed with the patient in reverse Trendelenburg.  +-----+---------------+---------+-----------+----------+--------------+  RIGHTCompressibilityPhasicitySpontaneityPropertiesThrombus Aging +-----+---------------+---------+-----------+----------+--------------+ CFV  Full           Yes      Yes                                 +-----+---------------+---------+-----------+----------+--------------+   +---------+---------------+---------+-----------+----------+--------------+ LEFT     CompressibilityPhasicitySpontaneityPropertiesThrombus Aging +---------+---------------+---------+-----------+----------+--------------+ CFV      Full           Yes      Yes                                 +---------+---------------+---------+-----------+----------+--------------+ SFJ      Full                                                        +---------+---------------+---------+-----------+----------+--------------+ FV Prox  Full           Yes      Yes                                 +---------+---------------+---------+-----------+----------+--------------+ FV Mid   Full           Yes      Yes                                 +---------+---------------+---------+-----------+----------+--------------+ FV DistalFull           Yes      Yes                                 +---------+---------------+---------+-----------+----------+--------------+ PFV      Full                                                        +---------+---------------+---------+-----------+----------+--------------+ POP      Full           Yes      Yes                                 +---------+---------------+---------+-----------+----------+--------------+ PTV      Full                                                        +---------+---------------+---------+-----------+----------+--------------+ PERO     Full                                                        +---------+---------------+---------+-----------+----------+--------------+  Summary: RIGHT: - No evidence of common femoral vein  obstruction.  LEFT: - There is no evidence of deep vein thrombosis in the lower extremity.  - No cystic structure found in the popliteal fossa.  *See table(s) above for measurements and observations.    Preliminary      LOS: 14 days   Antonieta Pert, MD [facility]  08/28/2022, 9:57 AM

## 2022-08-28 NOTE — Care Management Important Message (Signed)
Important Message  Patient Details  Name: VINCENTE ASBRIDGE MRN: [id number] Date of Birth: 16-Jun-1940   Medicare Important Message Given:  Yes     Shelda Altes 08/28/2022, 9:43 AM

## 2022-08-28 NOTE — Telephone Encounter (Signed)
[facility]) called stating that they are no longer able to provide PT services for the pt post visit to the hospital. He stated that pt is now in a state where he requires help from two individuals to help him and they are not in a capacity to send two reps out for [facility] recommends pt be admitted to a rehab center (pt also was recommended at hospital for rehab but declined).

## 2022-08-28 NOTE — Progress Notes (Signed)
Occupational Therapy Treatment Patient Details Name: Andre Stout MRN: [id number] DOB: 1940-08-17 Today's Date: 08/28/2022   History of present illness Pt presented 8/15 with new onset of RUQ abdominal pain.  Underwent percutaneous cholecystostomy tube placement on 8/18. 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   OT comments  Pt limited by pain in L knee again today. Painful with movement and touch. Pt denied any history of gout. Family (daughter, granddaughter and grandson) present to witness session, and see how much assist Mr. Maragh is currently needing. He was mod A +2 to come EOB with use of bed pad to facilitate. He was max A +2 to come into standing, mod A +2 from higher more solid surface of BSC. He wad initially unable to tolerate pivotal steps, so we had to bring the [facility] behind him (swap out with bed), He required max A in standing for peri care. He was max A for LB ADL at this time due to pain and decreased ROM. He will need to complete all ADL from seated position as he is dependent on BUE for short bouts of supported standing. Pt would benefit from short SNF stay, likely his family will elect to take him home, so updated equipment recommendations to include WC and BSC. OT will continue to follow acutely and if not SNF he will need HHOT post-acute to maximize safety and independence in ADL and functional transfers.   Recommendations for follow up therapy are one component of a multi-disciplinary discharge planning process, led by the attending physician.  Recommendations may be updated based on patient status, additional functional criteria and insurance authorization.    Follow Up Recommendations  Skilled nursing-short term rehab (<3 hours/day)    Assistance Recommended at Discharge Frequent or constant Supervision/Assistance  Patient can return home with the following  Two people to help with walking and/or  transfers;A lot of help with bathing/dressing/bathroom;Assistance with cooking/housework;Direct supervision/assist for medications management;Direct supervision/assist for financial management;Assist for transportation;Help with stairs or ramp for entrance   Equipment Recommendations  Wheelchair (measurements OT);Wheelchair cushion (measurements OT);BSC/3in1;Tub/shower bench    Recommendations for Other Services PT consult    Precautions / Restrictions Precautions Precautions: Fall Precaution Comments: R cholecystostomy drain Restrictions Weight Bearing Restrictions: No       Mobility Bed Mobility Overal bed mobility: Needs Assistance Bed Mobility: Supine to Sit     Supine to sit: Mod assist, +2 for physical assistance     General bed mobility comments: mod A at LE's, min A at trunk    Transfers Overall transfer level: Needs assistance Equipment used: Rolling walker (2 wheels) Transfers: Sit to/from Stand, Bed to chair/wheelchair/BSC Sit to Stand: Max assist, +2 physical assistance     Step pivot transfers: Mod assist, +2 physical assistance     General transfer comment: mod A +2 for support throughout transfer due to posterior LOB multiple times. Did slightly better standing from firm surface of BSC but still needed at least mod A +2     Balance Overall balance assessment: Needs assistance Sitting-balance support: No upper extremity supported, Feet supported Sitting balance-Leahy Scale: Fair   Postural control: Posterior lean Standing balance support: Reliant on assistive device for balance, Bilateral upper extremity supported Standing balance-Leahy Scale: Poor Standing balance comment: reliant on RW and external assist                           ADL  either performed or assessed with clinical judgement   ADL Overall ADL's : Needs assistance/impaired                     Lower Body Dressing: Maximal assistance;Sitting/lateral leans Lower Body  Dressing Details (indicate cue type and reason): to don socks Toilet Transfer: Maximal assistance;+2 for physical assistance;+2 for safety/equipment;BSC/3in1 Toilet Transfer Details (indicate cue type and reason): stand from bed, move bed and slide BSC in behind Toileting- Clothing Manipulation and Hygiene: Maximal assistance;+2 for physical assistance;+2 for safety/equipment;Sit to/from stand Toileting - Clothing Manipulation Details (indicate cue type and reason): Pt able to maintain standing with assist of 1 and 2nd person performed peri care in standing     Functional mobility during ADLs: Maximal assistance;+2 for physical assistance General ADL Comments: verbal cues for sequencing and safety    Extremity/Trunk Assessment Upper Extremity Assessment Upper Extremity Assessment: Overall WFL for tasks assessed   Lower Extremity Assessment Lower Extremity Assessment: Defer to PT evaluation        Vision   Additional Comments: history of glaucoma   Perception     Praxis      Cognition Arousal/Alertness: Awake/alert Behavior During Therapy: WFL for tasks assessed/performed Overall Cognitive Status: Impaired/Different from baseline Area of Impairment: Problem solving                       Following Commands: Follows one step commands with increased time       General Comments: slow processing, increased time to sequence activity        Exercises      Shoulder Instructions       General Comments once pt up and moving needed to have a BM. Assisted to [facility] and then back up again. Pt unable to assist with perineal care.    Pertinent Vitals/ Pain       Pain Assessment Pain Assessment: Faces Faces Pain Scale: Hurts whole lot Pain Location: L knee Pain Descriptors / Indicators: Grimacing, Guarding, Aching Pain Intervention(s): Limited activity within patient's tolerance, Monitored during session, Repositioned  Home Living                                           Prior Functioning/Environment              Frequency  Min 2X/week        Progress Toward Goals  OT Goals(current goals can now be found in the care plan section)  Progress towards OT goals: Not progressing toward goals - comment  Acute Rehab OT Goals Patient Stated Goal: to decrease knee pain OT Goal Formulation: With patient/family Time For Goal Achievement: 09/03/22 Potential to Achieve Goals: Good  Plan Discharge plan remains appropriate;Frequency remains appropriate    Co-evaluation    PT/OT/SLP Co-Evaluation/Treatment: Yes Reason for Co-Treatment: Complexity of the patient's impairments (multi-system involvement);For patient/therapist safety;To address functional/ADL transfers PT goals addressed during session: Mobility/safety with mobility;Balance;Strengthening/ROM;Proper use of DME        AM-PAC OT "6 Clicks" Daily Activity     Outcome Measure   Help from another person eating meals?: None Help from another person taking care of personal grooming?: A Little Help from another person toileting, which includes using toliet, bedpan, or urinal?: A Lot Help from another person bathing (including washing, rinsing, drying)?: A Lot Help from another person to put on  and taking off regular upper body clothing?: A Little Help from another person to put on and taking off regular lower body clothing?: Total 6 Click Score: 15    End of Session Equipment Utilized During Treatment: Gait belt;Rolling walker (2 wheels)  OT Visit Diagnosis: Other abnormalities of gait and mobility (R26.89);Muscle weakness (generalized) (M62.81);Other symptoms and signs involving cognitive function   Activity Tolerance Patient limited by pain   Patient Left in chair;with call bell/phone within reach   Nurse Communication Mobility status;Precautions        Time: 0093-8182 OT Time Calculation (min): 46 min  Charges: OT General Charges $OT Visit: 1 Visit OT  Treatments $Self Care/Home Management : 8-22 mins  [place] Office: [place] 08/28/2022, 1:19 PM

## 2022-08-28 NOTE — Progress Notes (Signed)
Patient is leaving with his daughter and grandson and granddaughter. Pt is alert and oriented and has his wheelchair as ordered.  Vital signs are within normal limits and he has pain in left knee. Pain gel Voltaren was prescribed for that pain. Pt was given all discharge instructions again and a copy was given to his daughter as well.  Pt teaching was given on the biliary drain and it was emptied prior to discharging home.  Education is complete. Plan of care is complete for this visit.

## 2022-08-28 NOTE — Progress Notes (Signed)
Physical Therapy Treatment Patient Details Name: Andre Stout MRN: [id number] DOB: 1940/12/30 Today's Date: 08/28/2022   History of Present Illness Pt presented 8/15 with new onset of RUQ abdominal pain.  Underwent percutaneous cholecystostomy tube placement on 8/18. 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    PT Comments    Pt continues to be significantly limited by L knee pain. Pt painful with mvmt and has very limited ROM but also cries out even with light touch. Pt denies any history of gout. Pt needed max A +2 to come to standing. Was able to step feet with RW for a few feet but then L knee began to buckle and he could go no further. He is appropriate for SNF but family will likely take him home in which case he will need a w/c as well as +2 assist for all transfers. PT will continue to follow.    Recommendations for follow up therapy are one component of a multi-disciplinary discharge planning process, led by the attending physician.  Recommendations may be updated based on patient status, additional functional criteria and insurance authorization.  Follow Up Recommendations  Skilled nursing-short term rehab (<3 hours/day) If pt does go home, recommend HHPT.  Can patient physically be transported by private vehicle: No   Assistance Recommended at Discharge Frequent or constant Supervision/Assistance  Patient can return home with the following Assistance with cooking/housework;Help with stairs or ramp for entrance;Assist for transportation;Direct supervision/assist for financial management;Direct supervision/assist for medications management;Two people to help with walking and/or transfers;A lot of help with bathing/dressing/bathroom   Equipment Recommendations  Wheelchair (measurements PT)    Recommendations for Other Services       Precautions / Restrictions Precautions Precautions: Fall Precaution  Comments: R cholecystostomy drain Restrictions Weight Bearing Restrictions: No     Mobility  Bed Mobility Overal bed mobility: Needs Assistance Bed Mobility: Supine to Sit     Supine to sit: Mod assist, +2 for physical assistance     General bed mobility comments: mod A at LE's, min A at trunk    Transfers Overall transfer level: Needs assistance Equipment used: Rolling walker (2 wheels) Transfers: Sit to/from Stand, Bed to chair/wheelchair/BSC Sit to Stand: Max assist, +2 physical assistance   Step pivot transfers: Mod assist, +2 physical assistance       General transfer comment: mod A +2 for support throughout transfer due to posterior LOB multiple times. Did slightly better standing from firm surface of BSC but still needed at least mod A +2    Ambulation/Gait Ambulation/Gait assistance: Mod assist, +2 physical assistance Gait Distance (Feet): 3 Feet Assistive device: Rolling walker (2 wheels) Gait Pattern/deviations: Step-to pattern Gait velocity: reduced Gait velocity interpretation: <1.31 ft/sec, indicative of household ambulator   General Gait Details: B knees maintained in flexion. Able to step feet a few times and then L knee began to buckle and he could go no further. Had great difficulty wt shifting to L to step R foot.   Stairs             Wheelchair Mobility    Modified Rankin (Stroke Patients Only)       Balance Overall balance assessment: Needs assistance Sitting-balance support: No upper extremity supported, Feet supported Sitting balance-Leahy Scale: Fair   Postural control: Posterior lean Standing balance support: Reliant on assistive device for balance, Bilateral upper extremity supported Standing balance-Leahy Scale: Poor Standing balance comment: reliant on RW and external assist  Cognition Arousal/Alertness: Awake/alert Behavior During Therapy: WFL for tasks assessed/performed Overall  Cognitive Status: Impaired/Different from baseline Area of Impairment: Problem solving                       Following Commands: Follows one step commands with increased time       General Comments: slow processing, increased time to sequence activity        Exercises      General Comments General comments (skin integrity, edema, etc.): once pt up and moving needed to have a BM. Assisted to [facility] and then back up again. Pt unable to assist with perineal care.      Pertinent Vitals/Pain Pain Assessment Pain Assessment: Faces Faces Pain Scale: Hurts whole lot Pain Location: L knee Pain Descriptors / Indicators: Grimacing, Guarding, Aching Pain Intervention(s): Limited activity within patient's tolerance, Monitored during session, Premedicated before session    Home Living                          Prior Function            PT Goals (current goals can now be found in the care plan section) Acute Rehab PT Goals Patient Stated Goal: return home PT Goal Formulation: With patient Time For Goal Achievement: 09/02/22 Potential to Achieve Goals: Fair Progress towards PT goals: Progressing toward goals    Frequency    Min 3X/week      PT Plan Current plan remains appropriate    Co-evaluation PT/OT/SLP Co-Evaluation/Treatment: Yes Reason for Co-Treatment: Complexity of the patient's impairments (multi-system involvement);For patient/therapist safety PT goals addressed during session: Mobility/safety with mobility;Balance;Proper use of DME;Strengthening/ROM        AM-PAC PT "6 Clicks" Mobility   Outcome Measure  Help needed turning from your back to your side while in a flat bed without using bedrails?: None Help needed moving from lying on your back to sitting on the side of a flat bed without using bedrails?: A Lot Help needed moving to and from a bed to a chair (including a wheelchair)?: Total Help needed standing up from a chair using your arms  (e.g., wheelchair or bedside chair)?: Total Help needed to walk in hospital room?: Total Help needed climbing 3-5 steps with a railing? : Total 6 Click Score: 10    End of Session Equipment Utilized During Treatment: Gait belt Activity Tolerance: Patient tolerated treatment well Patient left: with call bell/phone within reach;in chair Nurse Communication: Mobility status PT Visit Diagnosis: Other abnormalities of gait and mobility (R26.89);Muscle weakness (generalized) (M62.81)     Time: 2330-0762 PT Time Calculation (min) (ACUTE ONLY): 46 min  Charges:  $Gait Training: 8-22 mins $Therapeutic Activity: 8-22 mins                     Leighton Roach, PT  Acute Rehab Services Secure chat preferred Office [place] 08/28/2022, 1:08 PM

## 2022-08-28 NOTE — TOC Transition Note (Signed)
Transition of Care [facility]) - CM/SW Discharge Note   Patient Details  Name: Andre Stout MRN: [id number] Date of Birth: [birth year]  Transition of Care [facility]) CM/SW Contact:  Zenon Mayo, RN Phone Number: 08/28/2022, 11:32 AM   Clinical Narrative:    Patient is for dc today, daughter in room will wait for lightweight w/chair to be delivered to room by [employer].  She will transport him home by car  with her grandson assisting her.  Malachy Mood with [facility] has already contacted daughter regarding the [facility] services.    Final next level of care: [place] Barriers to Discharge: No Barriers Identified   Patient Goals and CMS Choice Patient states their goals for this hospitalization and ongoing recovery are:: return home with daughter CMS Medicare.gov Compare Post Acute Care list provided to:: Patient Represenative (must comment) Choice offered to / list presented to : Adult Children  Discharge Placement                       Discharge Plan and Services   Discharge Planning Services: CM Consult            DME Arranged: Lightweight manual wheelchair with seat cushion DME Agency: [employer] Date DME Agency Contacted: 08/28/22 Time DME Agency Contacted: 1132 Representative spoke with at DME Agency: Thedore Mins HH Arranged: RN, PT, OT [facility] Agency: [place] Date [place]: 08/25/22 Time [place]: 1107 Representative spoke with at [place]: [place] ([place]) Interventions     Readmission Risk Interventions    08/15/2022    9:25 AM  Readmission Risk Prevention Plan  Transportation Screening Complete  PCP or Specialist Appt within 5-7 Days Complete  Home Care Screening Complete  Medication Review (RN CM) Complete

## 2022-08-28 NOTE — Plan of Care (Signed)

## 2022-08-29 ENCOUNTER — Telehealth: Payer: Self-pay

## 2022-08-29 NOTE — Telephone Encounter (Signed)
Transition Care Management Unsuccessful Follow-up Telephone Call  Date of discharge and from where:  [facility]   Attempts:  1st Attempt  Reason for unsuccessful TCM follow-up call:  Unable to reach patient   Patient has hospital follow up scheduled with PCP on 08/30/2022.

## 2022-08-30 ENCOUNTER — Encounter: Payer: Self-pay | Admitting: Family

## 2022-08-30 ENCOUNTER — Ambulatory Visit (INDEPENDENT_AMBULATORY_CARE_PROVIDER_SITE_OTHER): Payer: Medicare Other | Admitting: Family

## 2022-08-30 ENCOUNTER — Other Ambulatory Visit: Payer: Self-pay | Admitting: Family

## 2022-08-30 VITALS — BP 140/70 | HR 68 | Temp 97.9°F | Ht 65.0 in

## 2022-08-30 DIAGNOSIS — K810 Acute cholecystitis: Secondary | ICD-10-CM | POA: Diagnosis not present

## 2022-08-30 DIAGNOSIS — R899 Unspecified abnormal finding in specimens from other organs, systems and tissues: Secondary | ICD-10-CM | POA: Diagnosis not present

## 2022-08-30 DIAGNOSIS — R790 Abnormal level of blood mineral: Secondary | ICD-10-CM

## 2022-08-30 DIAGNOSIS — I442 Atrioventricular block, complete: Secondary | ICD-10-CM

## 2022-08-30 DIAGNOSIS — E119 Type 2 diabetes mellitus without complications: Secondary | ICD-10-CM

## 2022-08-30 LAB — COMPREHENSIVE METABOLIC PANEL
ALT: 8 U/L (ref 0–53)
AST: 12 U/L (ref 0–37)
Albumin: 2.9 g/dL — ABNORMAL LOW (ref 3.5–5.2)
Alkaline Phosphatase: 75 U/L (ref 39–117)
BUN: 12 mg/dL (ref 6–23)
CO2: 27 mEq/L (ref 19–32)
Calcium: 9.1 mg/dL (ref 8.4–10.5)
Chloride: 101 mEq/L (ref 96–112)
Creatinine, Ser: 0.94 mg/dL (ref 0.40–1.50)
GFR: 75.87 mL/min (ref >60.00)
Glucose, Bld: 186 mg/dL — ABNORMAL HIGH (ref 70–99)
Potassium: 4.2 mEq/L (ref 3.5–5.1)
Sodium: 137 mEq/L (ref 135–145)
Total Bilirubin: 0.7 mg/dL (ref 0.2–1.2)
Total Protein: 6.5 g/dL (ref 6.0–8.3)

## 2022-08-30 LAB — CBC WITH DIFFERENTIAL/PLATELET
Basophils Absolute: 0.1 10*3/uL (ref 0.0–0.1)
Basophils Relative: 1.2 % (ref 0.0–3.0)
Eosinophils Absolute: 0.5 10*3/uL (ref 0.0–0.7)
Eosinophils Relative: 5.9 % — ABNORMAL HIGH (ref 0.0–5.0)
HCT: 38.8 % — ABNORMAL LOW (ref 39.0–52.0)
Hemoglobin: 12.4 g/dL — ABNORMAL LOW (ref 13.0–17.0)
Lymphocytes Relative: 22.4 % (ref 12.0–46.0)
Lymphs Abs: 1.9 10*3/uL (ref 0.7–4.0)
MCHC: 32.1 g/dL (ref 30.0–36.0)
MCV: 83.2 fl (ref 78.0–100.0)
Monocytes Absolute: 0.6 10*3/uL (ref 0.1–1.0)
Monocytes Relative: 7 % (ref 3.0–12.0)
Neutro Abs: 5.3 10*3/uL (ref 1.4–7.7)
Neutrophils Relative %: 63.5 % (ref 43.0–77.0)
Platelets: 417 10*3/uL — ABNORMAL HIGH (ref 150.0–400.0)
RBC: 4.67 Mil/uL (ref 4.22–5.81)
RDW: 15.6 % — ABNORMAL HIGH (ref 11.5–15.5)
WBC: 8.3 10*3/uL (ref 4.0–10.5)

## 2022-08-30 LAB — MAGNESIUM: Magnesium: 1.5 mg/dL (ref 1.5–2.5)

## 2022-08-30 MED ORDER — MAGNESIUM OXIDE -MG SUPPLEMENT 400 (240 MG) MG PO TABS
400.0000 mg | ORAL_TABLET | Freq: Every day | ORAL | 11 refills | Status: DC
Start: 2022-08-30 — End: 2022-10-16

## 2022-08-30 NOTE — Telephone Encounter (Signed)
HH was called to notify that patient can actually stand on his own/ requires walker for support but we do want him to resume home PT; new order being sent;

## 2022-08-30 NOTE — Progress Notes (Signed)
Andre Stout is a 82 y.o. 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[place])    Acute cholecystitis 08/14/2022   Heart block AV complete ([place]) 07/31/2022   Diarrhea 07/31/2022   Seizure ([place]) 01/02/2022   Abnormal EKG 01/02/2022   Acute hypokalemia 01/02/2022   Diabetes mellitus without complication ([place]) 58/08/9832   Syncope and collapse 10/09/2020   Prostate cancer ([place]) 03/25/2019   Hypophosphatemia 03/06/2016   Hypomagnesemia 03/06/2016   Influenza A 03/05/2016   Benign essential HTN 03/05/2016   AKI (acute kidney injury) ([place]) 03/04/2016   Routine general medical examination at a health care facility 05/02/2015   Medicare annual wellness visit, subsequent 05/02/2015    Current Outpatient Medications  Medication Sig Dispense Refill   acetaminophen (TYLENOL) 500 MG tablet Take 1,000 mg by mouth daily as needed (pain).     amLODipine (NORVASC) 10 MG tablet Take 1 tablet (10 mg total) by mouth daily. 30 tablet 0   aspirin 81 MG chewable tablet Chew 1 tablet (81 mg total) by mouth daily. (Patient taking differently: Chew 81 mg by mouth at bedtime.) 30 tablet 0   BD INSULIN SYRINGE U/F 31G X 5/16" 1 ML MISC USE 2 DAILY 200 each 4   Blood Glucose Monitoring Suppl (ACCU-CHEK GUIDE) w/Device KIT Use As Directed 1 kit 0   brimonidine (ALPHAGAN) 0.2 % ophthalmic solution Place 1 drop into both eyes 2 (two) times daily with breakfast and lunch.     cetirizine (ZYRTEC) 10 MG tablet Take 10 mg by mouth daily as needed (seasonal allergies).     dorzolamide-timolol (COSOPT) 22.3-6.8 MG/ML ophthalmic solution Place 1 drop into both eyes 2 (two) times daily with breakfast and lunch.     insulin NPH-regular Human (70-30) 100 UNIT/ML injection Inject 20 Units into the skin 2 (two) times daily with a  meal. 10 mL 11   latanoprost (XALATAN) 0.005 % ophthalmic solution Place 1 drop into both eyes at bedtime.     lisinopril (ZESTRIL) 20 MG tablet Take 1 tablet (20 mg total) by mouth daily. 30 tablet 0   Multiple Vitamins-Minerals (PRESERVISION AREDS 2) CAPS Take 1 capsule by mouth at bedtime.     Nutritional Supplements (FEEDING SUPPLEMENT, NEPRO CARB STEADY,) LIQD Take 237 mLs by mouth 3 (three) times daily between meals for 7 days. 4977 mL 0   ONETOUCH DELICA LANCETS 82N MISC Use to check blood sugar 4 times per day. Dx code: E11.9 200 each 2   ONETOUCH ULTRA test strip USE TO MONITOR GLUCOSE LEVELS 4 TIMES PER DAY E11.9 100 each 2   pantoprazole (PROTONIX) 40 MG tablet Take 1 tablet (40 mg total) by mouth 2 (two) times daily before a meal. 60 tablet 0   protein supplement (RESOURCE BENEPROTEIN) POWD Take 6 g by mouth 3 (three) times daily with meals. 540 g 0   sodium chloride flush 0.9 % SOLN injection Inject 5 mLs into the vein as needed for up to 30 doses. Discard remainder of syringe. 300 mL 0   sucralfate (CARAFATE) 1 GM/10ML suspension Take 10 mLs (1 g total) by mouth 4 (four) times daily -  with meals and at bedtime. 420 mL 0   magnesium oxide (MAG-OX) 400 (240 Mg) MG tablet Take 1 tablet (400 mg total) by mouth at  bedtime. 30 tablet 11   potassium chloride SA (KLOR-CON M) 10 MEQ tablet Take 1 tablet (10 mEq total) by mouth daily for 7 days. (Patient not taking: Reported on 08/30/2022) 7 tablet 0   No current facility-administered medications for this visit.    Allergies: Patient has no known allergies.  Past Medical History:  Diagnosis Date   Diabetes mellitus without complication (HCC)    Glaucoma    Heart block    complete heart block   Hypertension    Prostate cancer ([place])    Been 3-4 years ago    Past Surgical History:  Procedure Laterality Date   CATARACT EXTRACTION Bilateral    INSERTION PROSTATE RADIATION SEED     IR PERC CHOLECYSTOSTOMY  08/17/2022    Family History   Problem Relation Age of Onset   Diabetes Father    Diabetes Paternal Grandfather     Social History   Tobacco Use   Smoking status: Former    Packs/day: 0.50    Years: 4.00    Total pack years: 2.00    Types: Cigarettes    Quit date: 10/29/1974    Years since quitting: 47.8   Smokeless tobacco: Never  Substance Use Topics   Alcohol use: No    Subjective:  Hospital follow up; daughter is accompanying; Admitted with acute cholecystitis but not surgical candidate at the timedue to recurrent heart block/ concern for infection; drain placed to manage infection- daughter comfortable managing care- she is an Therapist, sports; will be meeting with general surgery and IR at the end of September; Needs CBC and CMP checked today; need to get clarification regarding magnesium and potassium management;  Also needs to be established with new endocrinologist; Needs Home health orders for PT- now able to stand on his own with assistance of walker; would be open to resuming care;  Per daughter, has been slowly working on improving food and fluid intake;     Objective:  Vitals:   08/30/22 1127  BP: (!) 140/70  Pulse: 68  Temp: 97.9 F (36.6 C)  TempSrc: Oral  SpO2: 97%  Height: 5' 5"  (1.651 m)    General: Well developed, well nourished, in no acute distress  Skin : Warm and dry.  Head: Normocephalic and atraumatic  Lungs: Respirations unlabored; clear to auscultation bilaterally without wheeze, rales, rhonchi  CVS exam: normal rate and regular rhythm.  Neurologic: Alert and oriented; speech intact; face symmetrical; in wheelchair  Assessment:  1. Abnormal laboratory test result   2. Low magnesium level   3. Acute cholecystitis   4. Heart block AV complete ([place])   5. Diabetes mellitus without complication ([place])     Plan:  Update labs as requested; will plan to continue magnesium for now; daughter was unaware that refill was updated from hospitalist; will make decision regarding potassium; Keep  planned follow up with general surgery and IR; cardiology scheduled for November; Will update referral to endocrine to establish with new provider;  [facility] as request to resume PT;  Time spent 30 minutes  No follow-ups on file.  Orders Placed This Encounter  Procedures   CBC with Differential/Platelet   Comp Met (CMET)   Magnesium    Requested Prescriptions   Signed Prescriptions Disp Refills   magnesium oxide (MAG-OX) 400 (240 Mg) MG tablet 30 tablet 11    Sig: Take 1 tablet (400 mg total) by mouth at bedtime.

## 2022-08-31 ENCOUNTER — Telehealth: Payer: Self-pay | Admitting: Family

## 2022-08-31 NOTE — Telephone Encounter (Signed)
Caller/Agency: Sheralyn Boatman Callback Number: [phone] Requesting OT/PT/Skilled Nursing/Social Work/Speech Therapy: Physical Therapy  Frequency: 2 x 4 and 1x1

## 2022-08-31 NOTE — Telephone Encounter (Signed)
Verbal order given for PT and OT eval.

## 2022-09-05 ENCOUNTER — Other Ambulatory Visit: Payer: Self-pay | Admitting: Family

## 2022-09-07 ENCOUNTER — Inpatient Hospital Stay: Payer: Medicare Other | Admitting: Family

## 2022-09-07 ENCOUNTER — Other Ambulatory Visit: Payer: Self-pay

## 2022-09-10 ENCOUNTER — Telehealth: Payer: Self-pay | Admitting: Family

## 2022-09-10 ENCOUNTER — Encounter (INDEPENDENT_AMBULATORY_CARE_PROVIDER_SITE_OTHER): Payer: Medicare Other | Admitting: Ophthalmology

## 2022-09-10 NOTE — Telephone Encounter (Signed)
Pt's daughter states hospital was supposed to call in pill form but has not, and she would like pcp to do it.   Medication: sucralfate (CARAFATE)  Has the patient contacted their pharmacy? No.   Preferred Pharmacy:  [id number] [place]  [address] Phone:  [phone] Fax:  [phone]

## 2022-09-11 ENCOUNTER — Other Ambulatory Visit: Payer: Self-pay | Admitting: Family

## 2022-09-11 MED ORDER — SUCRALFATE 1 GM/10ML PO SUSP
1.0000 g | Freq: Three times a day (TID) | ORAL | 0 refills | Status: DC
Start: 2022-09-11 — End: 2022-09-21

## 2022-09-11 NOTE — Telephone Encounter (Signed)
I have called the pt back and relayed the message from the provider. Pt daughter stated that they were told that the capsules were approved. I have informed her that it looks like the liquid was approved per documentation. I have informed that daughter to see if they can pick up the med and give [place] a call back if she runs into any trouble but this is going to be our last refill of this medication.   Information given to provider as a verbal.

## 2022-09-11 NOTE — Telephone Encounter (Signed)
Okay to send in the carafate for the pt?

## 2022-09-21 ENCOUNTER — Other Ambulatory Visit: Payer: Self-pay | Admitting: Family

## 2022-09-21 ENCOUNTER — Telehealth: Payer: Self-pay | Admitting: Family

## 2022-09-21 MED ORDER — SUCRALFATE 1 GM/10ML PO SUSP: 1.0000 g | Freq: Three times a day (TID) | ORAL | 1 refills | Status: DC

## 2022-09-21 NOTE — Telephone Encounter (Signed)
I have called the pt and daughter to let them know that we have sent in another refill since they will not see the GI until 09/28/22. After this refill Gi should be the one to take over the rx. Daughter stated understanding.

## 2022-09-21 NOTE — Telephone Encounter (Signed)
Patient called to get another refill on Carafate. Advised patient that Mickel Baas only planned to fill the prescription one time until he was able to see the provider for his gallbladder. Daughter, Sherrie Mustache, said she called [facility]-Dr. Cristal Generous office they said has to go through PCP, she called the Hospitalist which is who originally prescribed it and they said it has to come from PCP since he's discharged. Sherrie Mustache said he has enough for maybe another 24 hours but that's it. Appointment with Dr. Donne Hazel is not until 09/28/22 at 10:30. Please call daughter to advise as no other providers will refill the medication either.

## 2022-09-26 ENCOUNTER — Other Ambulatory Visit: Payer: Self-pay | Admitting: General Surgery

## 2022-09-26 DIAGNOSIS — K810 Acute cholecystitis: Secondary | ICD-10-CM

## 2022-09-28 ENCOUNTER — Ambulatory Visit
Admission: RE | Admit: 2022-09-28 | Discharge: 2022-09-28 | Disposition: A | Discharge status: Home / Self Care | Payer: Medicare Other | Source: Ambulatory Visit | Attending: General Surgery | Admitting: General Surgery

## 2022-09-28 ENCOUNTER — Encounter: Payer: Self-pay | Admitting: *Deleted

## 2022-09-28 DIAGNOSIS — K810 Acute cholecystitis: Secondary | ICD-10-CM

## 2022-09-28 HISTORY — PX: IR RADIOLOGIST EVAL & MGMT: IMG5224

## 2022-09-28 NOTE — Consult Note (Signed)
Chief Complaint: Patient was seen in consultation today for history of acute calculous cholecystitis  Referring Physician(s): Wakefield,Matthew  History of Present Illness: Andre Stout is a 82 y.o. male with history of acute calculous cholecystitis status post percutaneous cholecystostomy tube on 08/14/22.  He presents today via virtual telephone clinic visit accompanied by his daughter, Andre Stout.  He has had no issues with the tube.  No fevers, chills, nausea, vomiting, abdominal pain.  He is tired of having the tube in place.  He has not yet seen Surgery since discharge from the hospital.  He is awaiting Cardiology clearance to determine his surgical candidacy.  Past Medical History:  Diagnosis Date   Diabetes mellitus without complication ([place])    Glaucoma    Heart block    complete heart block   Hypertension    Prostate cancer ([place])    Been 3-4 years ago    Past Surgical History:  Procedure Laterality Date   CATARACT EXTRACTION Bilateral    INSERTION PROSTATE RADIATION SEED     IR PERC CHOLECYSTOSTOMY  08/17/2022    Allergies: Patient has no known allergies.  Medications: Prior to Admission medications   Medication Sig Start Date End Date Taking? Authorizing Provider  acetaminophen (TYLENOL) 500 MG tablet Take 1,000 mg by mouth daily as needed (pain).    [provider]  amLODipine (NORVASC) 10 MG tablet Take 1 tablet (10 mg total) by mouth daily. 08/24/22 09/23/22  Antonieta Pert, MD  aspirin 81 MG chewable tablet Chew 1 tablet (81 mg total) by mouth daily. Patient taking differently: Chew 81 mg by mouth at bedtime. 01/06/22   Swayze, Ava, DO  BD INSULIN SYRINGE U/F 31G X 5/16" 1 ML MISC USE 2 DAILY 08/24/21   Renato Shin, MD  Blood Glucose Monitoring Suppl (ACCU-CHEK GUIDE) w/Device KIT Use As Directed 08/24/22   Antonieta Pert, MD  brimonidine (ALPHAGAN) 0.2 % ophthalmic solution Place 1 drop into both eyes 2 (two) times daily with breakfast and lunch. 12/18/21    [provider]  cetirizine (ZYRTEC) 10 MG tablet Take 10 mg by mouth daily as needed (seasonal allergies).    [provider]  dorzolamide-timolol (COSOPT) 22.3-6.8 MG/ML ophthalmic solution Place 1 drop into both eyes 2 (two) times daily with breakfast and lunch. 05/18/22   [provider]  insulin NPH-regular Human (70-30) 100 UNIT/ML injection Inject 20 Units into the skin 2 (two) times daily with a meal. 08/27/22   Kc, Maren Beach, MD  latanoprost (XALATAN) 0.005 % ophthalmic solution Place 1 drop into both eyes at bedtime. 01/01/22   [provider]  lisinopril (ZESTRIL) 20 MG tablet Take 1 tablet (20 mg total) by mouth daily. 08/24/22 09/23/22  Antonieta Pert, MD  magnesium oxide (MAG-OX) 400 (240 Mg) MG tablet Take 1 tablet (400 mg total) by mouth at bedtime. 08/30/22 08/25/23  [name], FNP  Multiple Vitamins-Minerals (PRESERVISION AREDS 2) CAPS Take 1 capsule by mouth at bedtime.    [provider]  [facility] DELICA LANCETS 15N MISC Use to check blood sugar 4 times per day. Dx code: E11.9 03/27/16   Renato Shin, MD  [facility] ULTRA test strip USE TO MONITOR GLUCOSE LEVELS 4 TIMES PER DAY E11.9 06/10/19   Renato Shin, MD  pantoprazole (PROTONIX) 40 MG tablet Take 1 tablet (40 mg total) by mouth 2 (two) times daily before a meal. 08/24/22 09/23/22  Antonieta Pert, MD  potassium chloride SA (KLOR-CON M) 10 MEQ tablet Take 1 tablet (10  mEq total) by mouth daily for 7 days. Patient not taking: Reported on 08/30/2022 08/27/22 09/03/22  Antonieta Pert, MD  sodium chloride flush 0.9 % SOLN injection Inject 5 mLs into the vein as needed for up to 30 doses. Discard remainder of syringe. 08/24/22   Antonieta Pert, MD  sucralfate (CARAFATE) 1 GM/10ML suspension Take 10 mLs (1 g total) by mouth 4 (four) times daily -  with meals and at bedtime. 09/21/22   [name], FNP     Family History  Problem Relation Age of Onset   Diabetes Father    Diabetes Paternal  Grandfather     Social History   Socioeconomic History   Marital status: Widowed    Spouse name: Not on file   Number of children: 1   Years of education: 41   Highest education level: Not on file  Occupational History   Occupation: Retired  Tobacco Use   Smoking status: Former    Packs/day: 0.50    Years: 4.00    Total pack years: 2.00    Types: Cigarettes    Quit date: 10/29/1974    Years since quitting: 47.9   Smokeless tobacco: Never  Vaping Use   Vaping Use: Never used  Substance and Sexual Activity   Alcohol use: No   Drug use: No   Sexual activity: Not Currently  Other Topics Concern   Not on file  Social History Narrative   Born and raised in [place], [place]. Currently reside in a private residence by himself. Daughter lives in the area. No live. Fun: hunt and fish   Denies religious beliefs that would effect health care.    Social Determinants of Health   Financial Resource Strain: Low Risk  (03/26/2022)   Overall Financial Resource Strain (CARDIA)    Difficulty of Paying Living Expenses: Not hard at all  Food Insecurity: No Food Insecurity (03/26/2022)   Hunger Vital Sign    Worried About Running Out of Food in the Last Year: Never true    Ran Out of Food in the Last Year: Never true  Transportation Needs: No Transportation Needs (03/26/2022)   PRAPARE - Hydrologist (Medical): No    Lack of Transportation (Non-Medical): No  Physical Activity: Inactive (03/26/2022)   Exercise Vital Sign    Days of Exercise per Week: 0 days    Minutes of Exercise per Session: 0 min  Stress: No Stress Concern Present (03/26/2022)   [place]    Feeling of Stress : Not at all  Social Connections: Unknown (05/28/2019)   Social Connection and Isolation Panel [NHANES]    Frequency of Communication with Friends and Family: More than three times a week    Frequency of Social Gatherings  with Friends and Family: More than three times a week    Attends Religious Services: Not on file    Active Member of Clubs or Organizations: Yes    Attends Archivist Meetings: More than 4 times per year    Marital Status: Widowed    Review of Systems: A 12 point ROS discussed and pertinent positives are indicated in the HPI above.  All other systems are negative.   Vital Signs: There were no vitals taken for this visit.  Advance Care Plan: The advanced care plan/surrogate decision maker was discussed at the time of visit and documented in the medical record.    No physical examination was performed  in lieu of virtual telephone clinic visit.   Imaging: IR cholecystostomy 08/14/22  Labs:  CBC: Recent Labs    08/25/22 0541 08/26/22 0332 08/27/22 0720 08/30/22 1204  WBC 11.6* 12.9* 12.1* 8.3  HGB 11.8* 12.5* 11.8* 12.4*  HCT 35.5* 36.9* 35.0* 38.8*  PLT 376 396 353 417.0*    COAGS: Recent Labs    08/14/22 1200  INR 1.3*  APTT 31    BMP: Recent Labs    08/24/22 0508 08/25/22 0541 08/26/22 0332 08/27/22 0720 08/30/22 1204  NA 132* 133* 134* 136 137  K 3.6 4.4 3.8 3.4* 4.2  CL 101 103 101 105 101  CO2 18* 22 21* 21* 27  GLUCOSE 141* 184* 303* 201* 186*  BUN 7* 11 11 9 12   CALCIUM 8.1* 8.5* 8.6* 8.4* 9.1  CREATININE 1.17 1.18 1.17 1.13 0.94  GFRNONAA >60 >60 >60 >60  --     LIVER FUNCTION TESTS: Recent Labs    08/20/22 0726 08/21/22 0429 08/22/22 0510 08/30/22 1204  BILITOT 0.8 0.8 1.0 0.7  AST 14* 15 27 12   ALT 9 8 12 8   ALKPHOS 63 56 52 75  PROT 5.6* 5.5* 5.6* 6.5  ALBUMIN 2.2* 2.2* 2.3* 2.9*    TUMOR MARKERS: No results for input(s): "AFPTM", "CEA", "CA199", "CHROMGRNA" in the last 8760 hours.  Assessment and Plan: 82 year old male with history of acute calculous cholecystitis status post percutaneous cholecystotomy tube on 08/14/22.  His surgical candidacy is indeterminate, pending Cardiology clearance.  He has not yet followed  up with Surgery post-discharge.    We discussed the basics of choledochoscopic-assisted (Spyglass) percutaneous gallstone retrieval.  He remains an excellent candidate if not a surgical candidate.  He and his daughter are interested in pursuing this route if not a surgical candidate.    Plan to follow up in 1 months after Cardiology, Surgical evaluations.   Electronically Signed: Suzette Battiest, MD 09/28/2022, 1:02 PM   I spent a total of  40 Minutes  in virtual telephone clinical consultation, greater than 50% of which was counseling/coordinating care for calculous cholecystitis status post percutaneous cholecystostomy tube placement.

## 2022-10-04 ENCOUNTER — Telehealth: Payer: Self-pay | Admitting: *Deleted

## 2022-10-04 NOTE — Telephone Encounter (Signed)
   Name: Andre Stout  DOB: 09-09-1940  MRN: [id number]  Primary Cardiologist: Evalina Field, MD   Preoperative team, please contact this patient and set up a phone call appointment for further preoperative risk assessment. Please obtain consent and complete medication review. Thank you for your help.  I confirm that guidance regarding antiplatelet and oral anticoagulation therapy has been completed and, if necessary, noted below.  Patient's aspirin is managed by a noncardiology provider.  Recommendations for holding aspirin will need to come from prescribing provider.   Deberah Pelton, NP 10/04/2022, 4:02 PM [place]

## 2022-10-04 NOTE — Telephone Encounter (Signed)
   Pre-operative Risk Assessment    Patient Name: Andre Stout  DOB: 1940/05/24 MRN: [id number]      Request for Surgical Clearance     Procedure:   LAP CHOLE  Date of Surgery:  Clearance TBD                                 Surgeon:  DR. MATTHEW WAKEFIELD Surgeon's Group or Practice Name:  [facility] Phone number:  [phone] Fax number:  [phone] ATTN: Celestia Khat, CMA   Type of Clearance Requested:   - Medical ; ASA    Type of Anesthesia:  General    Additional requests/questions:    [name]   10/04/2022, 3:48 PM

## 2022-10-05 ENCOUNTER — Other Ambulatory Visit: Payer: Self-pay

## 2022-10-05 ENCOUNTER — Other Ambulatory Visit (HOSPITAL_COMMUNITY): Payer: Self-pay

## 2022-10-05 ENCOUNTER — Other Ambulatory Visit: Payer: Self-pay | Admitting: Family

## 2022-10-05 ENCOUNTER — Encounter: Payer: Self-pay | Admitting: Family

## 2022-10-05 MED ORDER — PANTOPRAZOLE SODIUM 40 MG PO TBEC: 40.0000 mg | DELAYED_RELEASE_TABLET | Freq: Two times a day (BID) | ORAL | 2 refills | Status: DC

## 2022-10-05 MED ORDER — SUCRALFATE 1 GM/10ML PO SUSP: 1.0000 g | Freq: Three times a day (TID) | ORAL | 2 refills | Status: DC

## 2022-10-05 NOTE — Telephone Encounter (Signed)
Left message to call back for tele pre op appt 

## 2022-10-08 ENCOUNTER — Other Ambulatory Visit: Payer: Self-pay | Admitting: General Surgery

## 2022-10-08 DIAGNOSIS — K810 Acute cholecystitis: Secondary | ICD-10-CM

## 2022-10-08 NOTE — Telephone Encounter (Signed)
Call placed to pt.  Spoke with daughter, [name], [place] on file.  She was under the impression that they wasn't going to do the surgery on pt, as mentioned below, due to heart block?  She is calling [facility] to confirm and if they are, she will call back and we will make pt a tele visit appointment.

## 2022-10-10 ENCOUNTER — Ambulatory Visit
Admission: RE | Admit: 2022-10-10 | Discharge: 2022-10-10 | Disposition: A | Discharge status: Home / Self Care | Payer: Medicare Other | Source: Ambulatory Visit | Attending: General Surgery | Admitting: General Surgery

## 2022-10-10 ENCOUNTER — Encounter: Payer: Self-pay | Admitting: Radiology

## 2022-10-10 DIAGNOSIS — K810 Acute cholecystitis: Secondary | ICD-10-CM

## 2022-10-10 HISTORY — PX: IR RADIOLOGIST EVAL & MGMT: IMG5224

## 2022-10-15 ENCOUNTER — Emergency Department (HOSPITAL_COMMUNITY): Payer: Medicare Other

## 2022-10-15 ENCOUNTER — Observation Stay (HOSPITAL_BASED_OUTPATIENT_CLINIC_OR_DEPARTMENT_OTHER): Payer: Medicare Other

## 2022-10-15 ENCOUNTER — Observation Stay (HOSPITAL_COMMUNITY): Payer: Medicare Other

## 2022-10-15 ENCOUNTER — Observation Stay (HOSPITAL_COMMUNITY)
Admission: EM | Admit: 2022-10-15 | Discharge: 2022-10-16 | Disposition: A | Discharge status: Home Health | Payer: Medicare Other | Attending: Internal Medicine | Admitting: Internal Medicine

## 2022-10-15 ENCOUNTER — Encounter (HOSPITAL_COMMUNITY): Payer: Self-pay

## 2022-10-15 ENCOUNTER — Other Ambulatory Visit: Payer: Self-pay

## 2022-10-15 DIAGNOSIS — Z87891 Personal history of nicotine dependence: Secondary | ICD-10-CM | POA: Diagnosis not present

## 2022-10-15 DIAGNOSIS — N179 Acute kidney failure, unspecified: Secondary | ICD-10-CM | POA: Insufficient documentation

## 2022-10-15 DIAGNOSIS — G459 Transient cerebral ischemic attack, unspecified: Secondary | ICD-10-CM

## 2022-10-15 DIAGNOSIS — I10 Essential (primary) hypertension: Secondary | ICD-10-CM | POA: Insufficient documentation

## 2022-10-15 DIAGNOSIS — Z7982 Long term (current) use of aspirin: Secondary | ICD-10-CM | POA: Diagnosis not present

## 2022-10-15 DIAGNOSIS — M6281 Muscle weakness (generalized): Secondary | ICD-10-CM | POA: Diagnosis not present

## 2022-10-15 DIAGNOSIS — Z79899 Other long term (current) drug therapy: Secondary | ICD-10-CM | POA: Insufficient documentation

## 2022-10-15 DIAGNOSIS — K810 Acute cholecystitis: Secondary | ICD-10-CM

## 2022-10-15 DIAGNOSIS — I639 Cerebral infarction, unspecified: Secondary | ICD-10-CM | POA: Diagnosis not present

## 2022-10-15 DIAGNOSIS — R55 Syncope and collapse: Principal | ICD-10-CM | POA: Insufficient documentation

## 2022-10-15 DIAGNOSIS — E1165 Type 2 diabetes mellitus with hyperglycemia: Secondary | ICD-10-CM | POA: Insufficient documentation

## 2022-10-15 DIAGNOSIS — I63512 Cerebral infarction due to unspecified occlusion or stenosis of left middle cerebral artery: Secondary | ICD-10-CM | POA: Diagnosis not present

## 2022-10-15 DIAGNOSIS — Z8546 Personal history of malignant neoplasm of prostate: Secondary | ICD-10-CM | POA: Diagnosis not present

## 2022-10-15 DIAGNOSIS — I4891 Unspecified atrial fibrillation: Secondary | ICD-10-CM

## 2022-10-15 DIAGNOSIS — R2689 Other abnormalities of gait and mobility: Secondary | ICD-10-CM | POA: Insufficient documentation

## 2022-10-15 DIAGNOSIS — Z794 Long term (current) use of insulin: Secondary | ICD-10-CM | POA: Insufficient documentation

## 2022-10-15 DIAGNOSIS — I442 Atrioventricular block, complete: Secondary | ICD-10-CM

## 2022-10-15 DIAGNOSIS — K219 Gastro-esophageal reflux disease without esophagitis: Secondary | ICD-10-CM | POA: Insufficient documentation

## 2022-10-15 LAB — CBG MONITORING, ED
Glucose-Capillary: 123 mg/dL — ABNORMAL HIGH (ref 70–99)
Glucose-Capillary: 149 mg/dL — ABNORMAL HIGH (ref 70–99)
Glucose-Capillary: 156 mg/dL — ABNORMAL HIGH (ref 70–99)
Glucose-Capillary: 170 mg/dL — ABNORMAL HIGH (ref 70–99)
Glucose-Capillary: 221 mg/dL — ABNORMAL HIGH (ref 70–99)
Glucose-Capillary: 304 mg/dL — ABNORMAL HIGH (ref 70–99)

## 2022-10-15 LAB — DIFFERENTIAL
Abs Immature Granulocytes: 0.05 10*3/uL (ref 0.00–0.07)
Basophils Absolute: 0.1 10*3/uL (ref 0.0–0.1)
Basophils Relative: 1 %
Eosinophils Absolute: 0.6 10*3/uL — ABNORMAL HIGH (ref 0.0–0.5)
Eosinophils Relative: 7 %
Immature Granulocytes: 1 %
Lymphocytes Relative: 40 %
Lymphs Abs: 3.7 10*3/uL (ref 0.7–4.0)
Monocytes Absolute: 0.9 10*3/uL (ref 0.1–1.0)
Monocytes Relative: 10 %
Neutro Abs: 4 10*3/uL (ref 1.7–7.7)
Neutrophils Relative %: 41 %

## 2022-10-15 LAB — COMPREHENSIVE METABOLIC PANEL
ALT: 10 U/L (ref 0–44)
AST: 13 U/L — ABNORMAL LOW (ref 15–41)
Albumin: 3.4 g/dL — ABNORMAL LOW (ref 3.5–5.0)
Alkaline Phosphatase: 79 U/L (ref 38–126)
Anion gap: 12 (ref 5–15)
BUN: 20 mg/dL (ref 8–23)
CO2: 19 mmol/L — ABNORMAL LOW (ref 22–32)
Calcium: 9.5 mg/dL (ref 8.9–10.3)
Chloride: 103 mmol/L (ref 98–111)
Creatinine, Ser: 1.45 mg/dL — ABNORMAL HIGH (ref 0.61–1.24)
GFR, Estimated: 48 mL/min — ABNORMAL LOW (ref >60)
Glucose, Bld: 171 mg/dL — ABNORMAL HIGH (ref 70–99)
Potassium: 4.1 mmol/L (ref 3.5–5.1)
Sodium: 134 mmol/L — ABNORMAL LOW (ref 135–145)
Total Bilirubin: 1 mg/dL (ref 0.3–1.2)
Total Protein: 6.7 g/dL (ref 6.5–8.1)

## 2022-10-15 LAB — ECHOCARDIOGRAM COMPLETE BUBBLE STUDY
AR max vel: 2.13 cm2
AV Area VTI: 2.12 cm2
AV Area mean vel: 1.99 cm2
AV Mean grad: 2.5 mmHg
AV Peak grad: 4.6 mmHg
Ao pk vel: 1.07 m/s
Area-P 1/2: 3.03 cm2
S' Lateral: 2.9 cm

## 2022-10-15 LAB — CBC
HCT: 41.1 % (ref 39.0–52.0)
Hemoglobin: 13.1 g/dL (ref 13.0–17.0)
MCH: 27 pg (ref 26.0–34.0)
MCHC: 31.9 g/dL (ref 30.0–36.0)
MCV: 84.6 fL (ref 80.0–100.0)
Platelets: 228 10*3/uL (ref 150–400)
RBC: 4.86 MIL/uL (ref 4.22–5.81)
RDW: 15.2 % (ref 11.5–15.5)
WBC: 9.4 10*3/uL (ref 4.0–10.5)
nRBC: 0 % (ref 0.0–0.2)

## 2022-10-15 LAB — I-STAT CHEM 8, ED
BUN: 22 mg/dL (ref 8–23)
Calcium, Ion: 1.16 mmol/L (ref 1.15–1.40)
Chloride: 105 mmol/L (ref 98–111)
Creatinine, Ser: 1.5 mg/dL — ABNORMAL HIGH (ref 0.61–1.24)
Glucose, Bld: 168 mg/dL — ABNORMAL HIGH (ref 70–99)
HCT: 40 % (ref 39.0–52.0)
Hemoglobin: 13.6 g/dL (ref 13.0–17.0)
Potassium: 4.3 mmol/L (ref 3.5–5.1)
Sodium: 136 mmol/L (ref 135–145)
TCO2: 22 mmol/L (ref 22–32)

## 2022-10-15 LAB — LIPID PANEL
Cholesterol: 111 mg/dL (ref 0–200)
HDL: 46 mg/dL (ref >40)
LDL Cholesterol: 38 mg/dL (ref 0–99)
Total CHOL/HDL Ratio: 2.4 RATIO
Triglycerides: 134 mg/dL (ref <150)
VLDL: 27 mg/dL (ref 0–40)

## 2022-10-15 LAB — HEMOGLOBIN A1C
Hgb A1c MFr Bld: 8.4 % — ABNORMAL HIGH (ref 4.8–5.6)
Mean Plasma Glucose: 194.38 mg/dL

## 2022-10-15 LAB — PROTIME-INR
INR: 1.1 (ref 0.8–1.2)
Prothrombin Time: 14.3 seconds (ref 11.4–15.2)

## 2022-10-15 LAB — ETHANOL: Alcohol, Ethyl (B): <10 mg/dL (ref <10)

## 2022-10-15 LAB — MAGNESIUM: Magnesium: 1.8 mg/dL (ref 1.7–2.4)

## 2022-10-15 LAB — APTT: aPTT: 26 seconds (ref 24–36)

## 2022-10-15 MED ORDER — INSULIN ASPART 100 UNIT/ML IJ SOLN
0.0000 [IU] | Freq: Every day | INTRAMUSCULAR | Status: DC
  Administered 2022-10-15: 2 [IU] via SUBCUTANEOUS

## 2022-10-15 MED ORDER — ACETAMINOPHEN 325 MG PO TABS: 650.0000 mg | ORAL_TABLET | Freq: Four times a day (QID) | ORAL | Status: DC | PRN

## 2022-10-15 MED ORDER — ATORVASTATIN CALCIUM 40 MG PO TABS: 40.0000 mg | ORAL_TABLET | Freq: Every day | ORAL | Status: DC

## 2022-10-15 MED ORDER — PANTOPRAZOLE SODIUM 40 MG PO TBEC
40.0000 mg | DELAYED_RELEASE_TABLET | Freq: Two times a day (BID) | ORAL | Status: DC
  Administered 2022-10-15 – 2022-10-16 (×4): 40 mg via ORAL
  Filled 2022-10-15 (×4): qty 1

## 2022-10-15 MED ORDER — CLOPIDOGREL BISULFATE 75 MG PO TABS
75.0000 mg | ORAL_TABLET | Freq: Every day | ORAL | Status: DC
  Administered 2022-10-15 – 2022-10-16 (×2): 75 mg via ORAL
  Filled 2022-10-15 (×2): qty 1

## 2022-10-15 MED ORDER — ASPIRIN 81 MG PO TBEC
81.0000 mg | DELAYED_RELEASE_TABLET | Freq: Every day | ORAL | Status: DC
  Administered 2022-10-15 – 2022-10-16 (×2): 81 mg via ORAL
  Filled 2022-10-15 (×2): qty 1

## 2022-10-15 MED ORDER — MELATONIN 5 MG PO TABS: 5.0000 mg | ORAL_TABLET | Freq: Every evening | ORAL | Status: DC | PRN

## 2022-10-15 MED ORDER — POLYETHYLENE GLYCOL 3350 17 G PO PACK: 17.0000 g | PACK | Freq: Every day | ORAL | Status: DC | PRN

## 2022-10-15 MED ORDER — SODIUM CHLORIDE 0.9% FLUSH: 3.0000 mL | Freq: Once | INTRAVENOUS | Status: DC

## 2022-10-15 MED ORDER — ONDANSETRON HCL 4 MG/2ML IJ SOLN: 4.0000 mg | Freq: Four times a day (QID) | INTRAMUSCULAR | Status: DC | PRN

## 2022-10-15 MED ORDER — INSULIN ASPART 100 UNIT/ML IJ SOLN
0.0000 [IU] | Freq: Three times a day (TID) | INTRAMUSCULAR | Status: DC
  Administered 2022-10-15: 1 [IU] via SUBCUTANEOUS
  Administered 2022-10-15: 7 [IU] via SUBCUTANEOUS
  Administered 2022-10-15: 2 [IU] via SUBCUTANEOUS
  Administered 2022-10-16: 5 [IU] via SUBCUTANEOUS
  Administered 2022-10-16: 3 [IU] via SUBCUTANEOUS
  Administered 2022-10-16: 7 [IU] via SUBCUTANEOUS

## 2022-10-15 MED ORDER — ATORVASTATIN CALCIUM 80 MG PO TABS
80.0000 mg | ORAL_TABLET | Freq: Every day | ORAL | Status: DC
  Administered 2022-10-15: 80 mg via ORAL
  Filled 2022-10-15: qty 2

## 2022-10-15 MED ORDER — PERFLUTREN LIPID MICROSPHERE
1.0000 mL | INTRAVENOUS | Status: AC | PRN
  Administered 2022-10-15: 2 mL via INTRAVENOUS

## 2022-10-15 MED ORDER — SODIUM CHLORIDE 0.9 % IV SOLN: INTRAVENOUS | Status: DC

## 2022-10-15 MED ORDER — ENOXAPARIN SODIUM 40 MG/0.4ML IJ SOSY
40.0000 mg | PREFILLED_SYRINGE | INTRAMUSCULAR | Status: DC
  Administered 2022-10-15 – 2022-10-16 (×2): 40 mg via SUBCUTANEOUS
  Filled 2022-10-15 (×2): qty 0.4

## 2022-10-15 MED ORDER — SODIUM CHLORIDE 0.9 % IV SOLN: INTRAVENOUS | Status: AC

## 2022-10-15 MED ORDER — LACTATED RINGERS IV BOLUS
1000.0000 mL | Freq: Once | INTRAVENOUS | Status: AC
  Administered 2022-10-15: 1000 mL via INTRAVENOUS

## 2022-10-15 MED ORDER — MAGNESIUM SULFATE 2 GM/50ML IV SOLN
2.0000 g | Freq: Once | INTRAVENOUS | Status: AC
  Administered 2022-10-15: 2 g via INTRAVENOUS
  Filled 2022-10-15: qty 50

## 2022-10-15 MED ORDER — SUCRALFATE 1 GM/10ML PO SUSP
1.0000 g | Freq: Three times a day (TID) | ORAL | Status: DC
  Administered 2022-10-15 – 2022-10-16 (×7): 1 g via ORAL
  Filled 2022-10-15 (×9): qty 10

## 2022-10-15 MED ORDER — ATORVASTATIN CALCIUM 40 MG PO TABS
40.0000 mg | ORAL_TABLET | Freq: Every day | ORAL | Status: DC
  Administered 2022-10-16: 40 mg via ORAL
  Filled 2022-10-15: qty 1

## 2022-10-15 NOTE — H&P (Addendum)
History and Physical  Andre Stout BWL:[id number] DOB: [birth year] DOA: 10/15/2022  Referring physician: Dr. Dayna Barker, [place]  PCP: [name], [place]  Outpatient Specialists: Neurology Patient coming from: Home  Chief Complaint: Right sided weakness.   HPI: Andre Stout is a 82 y.o. male with medical history significant for complete heart block, hypertension, type 2 diabetes, prostate cancer, recently admitted for episodes of syncope and found to have complete heart block, followed by cardiology, who presented to [facility] ED with strokelike symptoms.  He had right-sided weakness with dysarthria and could not get off the toilet.  EMS was called.  Code stroke was activated by EMS.  Last known well 2215 on 10/14/2022.  In the ED, his symptoms improved.  On neurology's assessment he was noted to have right superior quadrantanopsia.  MRI brain nonacute.  Seen by neurology/stroke team.  Recommended TIA work-up.  The patient was admitted by [facility], hospitalist service.  ED Course: Tmax 98.  BP 129/65.  Pulse 65.  Respiratory 15.  O2 saturation 99% on room air.  Lab studies remarkable for serum glucose 168, creatinine 1.50.  Review of Systems: Review of systems as noted in the HPI. All other systems reviewed and are negative.   Past Medical History:  Diagnosis Date   Diabetes mellitus without complication ([place])    Glaucoma    Heart block    complete heart block   Hypertension    Prostate cancer ([place])    Been 3-4 years ago   Past Surgical History:  Procedure Laterality Date   CATARACT EXTRACTION Bilateral    INSERTION PROSTATE RADIATION SEED     IR PERC CHOLECYSTOSTOMY  08/17/2022   IR RADIOLOGIST EVAL & MGMT  09/28/2022   IR RADIOLOGIST EVAL & MGMT  10/10/2022    Social History:  reports that he quit smoking about 47 years ago. His smoking use included cigarettes. He has a 2.00 pack-year smoking history. He has never used smokeless tobacco. He reports that he does not drink alcohol and  does not use drugs.   No Known Allergies  Family History  Problem Relation Age of Onset   Diabetes Father    Diabetes Paternal Grandfather       Prior to Admission medications   Medication Sig Start Date End Date Taking? Authorizing Provider  acetaminophen (TYLENOL) 500 MG tablet Take 1,000 mg by mouth daily as needed (pain).   Yes [provider]  amLODipine (NORVASC) 10 MG tablet Take 1 tablet (10 mg total) by mouth daily. 08/24/22 09/23/22  Antonieta Pert, MD  aspirin 81 MG chewable tablet Chew 1 tablet (81 mg total) by mouth daily. Patient taking differently: Chew 81 mg by mouth at bedtime. 01/06/22   Swayze, Ava, DO  BD INSULIN SYRINGE U/F 31G X 5/16" 1 ML MISC USE 2 DAILY 08/24/21   Renato Shin, MD  Blood Glucose Monitoring Suppl (ACCU-CHEK GUIDE) w/Device KIT Use As Directed 08/24/22   Antonieta Pert, MD  brimonidine (ALPHAGAN) 0.2 % ophthalmic solution Place 1 drop into both eyes 2 (two) times daily with breakfast and lunch. 12/18/21   [provider]  cetirizine (ZYRTEC) 10 MG tablet Take 10 mg by mouth daily as needed (seasonal allergies).    [provider]  dorzolamide-timolol (COSOPT) 22.3-6.8 MG/ML ophthalmic solution Place 1 drop into both eyes 2 (two) times daily with breakfast and lunch. 05/18/22   [provider]  insulin NPH-regular Human (70-30) 100 UNIT/ML injection Inject 20 Units into the skin 2 (two) times  daily with a meal. 08/27/22   Kc, Maren Beach, MD  latanoprost (XALATAN) 0.005 % ophthalmic solution Place 1 drop into both eyes at bedtime. 01/01/22   [provider]  lisinopril (ZESTRIL) 20 MG tablet Take 1 tablet (20 mg total) by mouth daily. 08/24/22 09/23/22  Antonieta Pert, MD  magnesium oxide (MAG-OX) 400 (240 Mg) MG tablet Take 1 tablet (400 mg total) by mouth at bedtime. 08/30/22 08/25/23  [name], FNP  Multiple Vitamins-Minerals (PRESERVISION AREDS 2) CAPS Take 1 capsule by mouth at bedtime.    [provider]   [facility] DELICA LANCETS 80D MISC Use to check blood sugar 4 times per day. Dx code: E11.9 03/27/16   Renato Shin, MD  [facility] ULTRA test strip USE TO MONITOR GLUCOSE LEVELS 4 TIMES PER DAY E11.9 06/10/19   Renato Shin, MD  pantoprazole (PROTONIX) 40 MG tablet Take 1 tablet (40 mg total) by mouth 2 (two) times daily before a meal. 10/05/22 01/03/23  [name], FNP  potassium chloride SA (KLOR-CON M) 10 MEQ tablet Take 1 tablet (10 mEq total) by mouth daily for 7 days. Patient not taking: Reported on 08/30/2022 08/27/22 09/03/22  Antonieta Pert, MD  sodium chloride flush 0.9 % SOLN injection Inject 5 mLs into the vein as needed for up to 30 doses. Discard remainder of syringe. 08/24/22   Antonieta Pert, MD  sucralfate (CARAFATE) 1 GM/10ML suspension Take 10 mLs (1 g total) by mouth 4 (four) times daily -  with meals and at bedtime. 10/05/22   [name], FNP    Physical Exam: BP (!) 132/59   Pulse 72   Temp 98 F (36.7 C) (Oral)   Resp 15   Ht 5' 5" (1.651 m)   Wt 85.4 kg   SpO2 97%   BMI 31.33 kg/m   General: 82 y.o. year-old male well developed well nourished in no acute distress.  Alert and oriented x3. Cardiovascular: Regular rate and rhythm with no rubs or gallops.  No thyromegaly or JVD noted.  No lower extremity edema. 2/4 pulses in all 4 extremities. Respiratory: Clear to auscultation with no wheezes or rales. Good inspiratory effort. Abdomen: Soft nontender nondistended with normal bowel sounds x4 quadrants. Muskuloskeletal: No cyanosis, clubbing or edema noted bilaterally Neuro: CN II-XII intact, strength, sensation, reflexes Skin: No ulcerative lesions noted or rashes Psychiatry: Judgement and insight appear normal. Mood is appropriate for condition and setting          Labs on Admission:  Basic Metabolic Panel: Recent Labs  Lab 10/15/22 0026 10/15/22 0031  NA 134* 136  K 4.1 4.3  CL 103 105  CO2 19*  --   GLUCOSE 171* 168*  BUN 20 22  CREATININE  1.45* 1.50*  CALCIUM 9.5  --   MG 1.8  --    Liver Function Tests: Recent Labs  Lab 10/15/22 0026  AST 13*  ALT 10  ALKPHOS 79  BILITOT 1.0  PROT 6.7  ALBUMIN 3.4*   No results for input(s): "LIPASE", "AMYLASE" in the last 168 hours. No results for input(s): "AMMONIA" in the last 168 hours. CBC: Recent Labs  Lab 10/15/22 0026 10/15/22 0031  WBC 9.4  --   NEUTROABS 4.0  --   HGB 13.1 13.6  HCT 41.1 40.0  MCV 84.6  --   PLT 228  --    Cardiac Enzymes: No results for input(s): "CKTOTAL", "CKMB", "CKMBINDEX", "TROPONINI" in the last 168 hours.  BNP (last 3 results) Recent  Labs    07/31/22 2013  BNP 151.7*    ProBNP (last 3 results) No results for input(s): "PROBNP" in the last 8760 hours.  CBG: Recent Labs  Lab 10/15/22 0024  GLUCAP 170*    Radiological Exams on Admission: MR BRAIN WO CONTRAST  Result Date: 10/15/2022 CLINICAL DATA:  Right-sided weakness EXAM: MRI HEAD WITHOUT CONTRAST TECHNIQUE: Multiplanar, multiecho pulse sequences of the brain and surrounding structures were obtained without intravenous contrast. COMPARISON:  08/01/2022 FINDINGS: Brain: No acute infarct, mass effect or extra-axial collection. No acute or chronic hemorrhage. Faint areas of hyperintensity on diffusion-weighted imaging in the left frontal white matter likely are due to T2 shine through effects. There is multifocal periventricular white matter hyperintensity, most often a result of chronic microvascular ischemia. There is generalized atrophy without lobar predilection. The midline structures are normal. 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 Vascular: Major flow voids are preserved. Skull and upper cervical spine: Normal calvarium and skull base. Visualized upper cervical spine and soft tissues are normal. Sinuses/Orbits:No paranasal sinus fluid levels or advanced mucosal thickening. No mastoid or middle ear effusion. Normal orbits. IMPRESSION:  1. No acute intracranial abnormality. 2. Generalized atrophy and findings of chronic small vessel ischemia. 3. Unchanged right CP angle meningioma. Electronically Signed   By: Ulyses Jarred M.D.   On: 10/15/2022 01:26   CT HEAD CODE STROKE WO CONTRAST  Result Date: 10/15/2022 CLINICAL DATA:  Code stroke.  Right-sided weakness EXAM: CT HEAD WITHOUT CONTRAST TECHNIQUE: Contiguous axial images were obtained from the base of the skull through the vertex without intravenous contrast. RADIATION DOSE REDUCTION: This exam was performed according to the departmental dose-optimization program which includes automated exposure control, adjustment of the mA and/or kV according to patient size and/or use of iterative reconstruction technique. COMPARISON:  None Available. FINDINGS: Brain: There is no mass, hemorrhage or extra-axial collection. There is generalized atrophy without lobar predilection. There is hypoattenuation of the periventricular white matter, most commonly indicating chronic ischemic microangiopathy. Vascular: No abnormal hyperdensity of the major intracranial arteries or dural venous sinuses. No intracranial atherosclerosis. Skull: The visualized skull base, calvarium and extracranial soft tissues are normal. Sinuses/Orbits: No fluid levels or advanced mucosal thickening of the visualized paranasal sinuses. No mastoid or middle ear effusion. The orbits are normal. ASPECTS [facility] Stroke Program Early CT Score) - Ganglionic level infarction (caudate, lentiform nuclei, internal capsule, insula, M1-M3 cortex): 7 - Supraganglionic infarction (M4-M6 cortex): 3 Total score (0-10 with 10 being normal): 10 IMPRESSION: 1. No acute intracranial abnormality. 2. ASPECTS is 10 These results were communicated to Dr. Donnetta Simpers at 12:38 am on 10/15/2022 by text page via the [facility] messaging system. Electronically Signed   By: Ulyses Jarred M.D.   On: 10/15/2022 00:38    EKG: I independently viewed the EKG done  and my findings are as followed: Sinus rhythm rate of 75.  Nonspecific ST-T changes.  QTc 426.  Assessment/Plan Present on Admission:  TIA (transient ischemic attack)  Principal Problem:   TIA (transient ischemic attack)  TIA, resolved. Presented with right-sided weakness and dysarthria Admitted for TIA work-up MRI brain nonacute. MRA angio head without contrast and bilateral carotid Doppler ultrasound. 2D echo with bubble study Fasting lipid panel and hemoglobin A1c Aspirin 81 mg daily and Plavix 75 mg daily x21 days followed by aspirin 81 mg daily alone, as recommended by neurology/stroke team. PT/OT/speech therapist assessment Fall precautions  AKI, prerenal in the setting of dehydration from poor  oral intake Baseline creatinine 0.9 with GFR greater than 60 Presented with creatinine 1.45 with GFR 48. Start NS at 50 cc/h x 1 day. Monitor urine output with strict I's and O's Avoid nephrotoxic agents, dehydration and hypotension. Repeat renal panel in the morning  Type 2 diabetes with hyperglycemia Hemoglobin A1c, goal less than 7.0. Insulin sliding scale.  Hyperlipidemia Last LDL 104 on 01/03/2022, goal less than 70. Started Lipitor 80 mg daily.  Essential hypertension BPs are currently soft Continue to hold off home oral antihypertensives to avoid hypotension. 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  Admission status: Observation status.   Status is: Observation    Kayleen Memos MD [facility] Pager [phone]  If 7PM-7AM, please contact night-coverage [email or web address] Password [email or web address]  10/15/2022, 3:36 AM

## 2022-10-15 NOTE — ED Notes (Signed)
Pt transferred onto hospital bed.

## 2022-10-15 NOTE — ED Notes (Signed)
Gallbladder output 150 mls

## 2022-10-15 NOTE — Code Documentation (Addendum)
Responded to Code Stroke called at 3546 for R sided facial droop, R sided weakness, and slurred speech, [id number]. Pt arrived at [facility] with above symptoms resolved,  however NIH-2 for not knowing age and R upper quadrantanopia, CBG-170, CT head-negative for acute changes. Pt transported to MRI at 0050 d/t unclear onset of visual deficit. MRI-negative for acute stroke. TNK not given-stroke not present. Plan TIA alert/stroke workup.

## 2022-10-15 NOTE — ED Notes (Signed)
Lunch tray at bedside. ?

## 2022-10-15 NOTE — ED Triage Notes (Addendum)
Pt arrived via [facility] from home. Pt's wife saw him at 2215 and he was fine. At 2300 he was not able to get up from toilet due to right sided weakness.  EMS activated code stroke while in route to the hospital at 2355.  Pt had right sided facial drooping, slurred speech and right sided weakness.  During transport, pt's facial drooping, weakness and slurred speech improved.  On arrival, pt is alert and oriented. Pt unable to say how old he is. Stroke team at bedside on arrival. Pt to CT scan.   EMS VS 136/80 Cbg=404

## 2022-10-15 NOTE — Progress Notes (Addendum)
Patient admitted earlier this morning.  H&P reviewed.  Patient seen and examined.  He is a 82 year old African-American male with history of complete heart block, essential hypertension, diabetes mellitus type 2, prostate cancer recently admitted for episodes of syncope and was found to have complete heart block.  Followed by cardiology.  Presented with right-sided weakness with dysarthria.  MRI brain did not show acute stroke.  TIA was suspected.  S: Patient mentions that the right side now feels much better compared to yesterday.  Denies any vision impairment.  No speech difficulties.  O: Vital signs are noted to be stable.  Lungs are clear to auscultation bilaterally S1-S2 is normal regular Abdomen is soft.  Nontender nondistended He is alert.  No obvious focal neurological deficits noted on examination.  Labs reviewed.  LDL and HbA1c pending.  MRI brain did not show any acute findings.  Generalized atrophy and findings of chronic small vessel ischemia noted.  Unchanged right CP angle meningioma noted.  MRA head does not show any large vessel occlusion.  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  Carotid Doppler does not show any significant stenosis.  Echocardiogram is pending.  A/P: TIA: Patient admitted with concern for TIA.  No stroke noted on MRI.  TIA work-up is in progress.  PT and OT evaluation completed and they recommend home health.  We do not know neurology input today.  Wait on echocardiogram.  Acute kidney injury: Continue with IV fluids.  Came in with creatinine of 1.45.  Baseline creatinine is noted to be normal.  Recheck labs tomorrow.  Diabetes mellitus type 2, uncontrolled with hyperglycemia: Continue SSI.  Hyperlipidemia: LDL was 104 in January.  Recheck is pending.  Started on statin.  Essential hypertension Holding home medications due to borderline low blood pressures.  Recent cholecystitis status post percutaneous  cholecystostomy.  Was seen by general surgery during his previous hospitalization in August.  Underwent drainage procedure by interventional radiology.  Still has a drain bag.  General surgery had planned interval cholecystectomy in a few weeks.  Neurology has reached out to general surgery.  Complete heart block: Noted on EKG.  Previous history of same.  Seen by cardiologist Dr. Lovena Le during previous admission in August.  Was not thought to require pacemaker at that time.  Heart rate is greater than 60.  He is asymptomatic.  Continue to monitor on telemetry for now.  Keep potassium greater than 4.3.  Keep magnesium greater than 1.8.  We will need to continue with IV fluids today and recheck labs tomorrow for his acute kidney injury.  Bonnielee Haff 10/15/2022

## 2022-10-15 NOTE — Progress Notes (Signed)
Carotid duplex has been completed.   Preliminary results in CV Proc.   Tymika Grilli Shreyansh Tiffany 10/15/2022 9:04 AM

## 2022-10-15 NOTE — ED Notes (Signed)
Pt to MRI

## 2022-10-15 NOTE — Evaluation (Signed)
Occupational Therapy Evaluation Patient Details Name: Andre Stout MRN: [id number] DOB: 1940-12-03 Today's Date: 10/15/2022   History of Present Illness Pt is an 82 y/o male who presented after experienced R sided weakness, R UE numbness and slurred speech. MRI brain negative. Workup for TIA. PMH: complete heart block, hypertension, type 2 diabetes, prostate cancer, glaucoma   Clinical Impression   PTA, pt lives with grandson, typically Modified Independent with ADLs and mobility using RW. Pt presents now with minor deficits in R UE strength, coordination, and dynamic standing balance. Pt's speech remains slurred intermittently during session. Pt also with questionable R UE inattention and visual deficits to be further assessed in functional context. Overall, pt requires Min A for bed mobility, min guard for short distance mobility from stretcher using RW. Pt requires Setup for UB ADL and min guard - Min A for LB ADLs. With adequate family support, recommend HHOT follow up at DC. Will continue to follow acutely if pt to remain admitted.       Recommendations for follow up therapy are one component of a multi-disciplinary discharge planning process, led by the attending physician.  Recommendations may be updated based on patient status, additional functional criteria and insurance authorization.   Follow Up Recommendations  Home health OT    Assistance Recommended at Discharge Set up Supervision/Assistance  Patient can return home with the following A little help with bathing/dressing/bathroom;Assistance with cooking/housework;Assist for transportation;Help with stairs or ramp for entrance    Functional Status Assessment  Patient has had a recent decline in their functional status and demonstrates the ability to make significant improvements in function in a reasonable and predictable amount of time.  Equipment Recommendations  BSC/3in1 (to place over toilet)    Recommendations for Other  Services       Precautions / Restrictions Precautions Precautions: Fall;Other (comment) Precaution Comments: biliary drain Restrictions Weight Bearing Restrictions: No      Mobility Bed Mobility Overal bed mobility: Needs Assistance Bed Mobility: Supine to Sit, Sit to Supine     Supine to sit: Min assist, HOB elevated Sit to supine: Min assist   General bed mobility comments: handheld assist to lift trunk with Min A to get BLE back onto stretcher    Transfers Overall transfer level: Needs assistance Equipment used: Rolling walker (2 wheels) Transfers: Sit to/from Stand Sit to Stand: Min guard           General transfer comment: to stand from stretcher      Balance Overall balance assessment: Needs assistance Sitting-balance support: No upper extremity supported, Feet supported Sitting balance-Leahy Scale: Fair     Standing balance support: Bilateral upper extremity supported, During functional activity Standing balance-Leahy Scale: Fair                             ADL either performed or assessed with clinical judgement   ADL Overall ADL's : Needs assistance/impaired Eating/Feeding: Set up   Grooming: Supervision/safety;Standing   Upper Body Bathing: Set up;Sitting   Lower Body Bathing: Minimal assistance;Sitting/lateral leans;Sit to/from stand   Upper Body Dressing : Set up   Lower Body Dressing: Minimal assistance;Sitting/lateral leans;Sit to/from stand Lower Body Dressing Details (indicate cue type and reason): assist for socks d/t high stretcher height Toilet Transfer: Min guard;Ambulation;Rolling walker (2 wheels)   Toileting- Clothing Manipulation and Hygiene: Min guard;Sitting/lateral lean;Sit to/from stand       Functional mobility during ADLs: Min guard;Rolling walker (2  wheels) General ADL Comments: Pt appears fairly close to reported baseline though RUE weakness noted, R UE functional during tasks with questionable inattention  during activity     Vision Baseline Vision/History: 1 Wears glasses Ability to See in Adequate Light: 0 Adequate Patient Visual Report: No change from baseline;Other (comment) (pt denies any concerns) Vision Assessment?: Vision impaired- to be further tested in functional context Additional Comments: Neuro note indicates right superior quadrantanopsia with further assessment needed. Pt able to track targets fairly well on OT eval     Perception     Praxis      Pertinent Vitals/Pain Pain Assessment Pain Assessment: No/denies pain     Hand Dominance Right   Extremity/Trunk Assessment Upper Extremity Assessment Upper Extremity Assessment: RUE deficits/detail RUE Deficits / Details: Reports sensation back to baseline and feels the same as L UE. Able to move UE in all planes, opposition intact though slow. finger to nose intact. With progressive activity with RW, pt R UE appeared to drop from RW and some inattention noted with activity (with fatigue?) RUE Sensation: WNL   Lower Extremity Assessment Lower Extremity Assessment: Defer to PT evaluation   Cervical / Trunk Assessment Cervical / Trunk Assessment: Normal   Communication Communication Communication: Other (comment);HOH (some slurred speech)   Cognition Arousal/Alertness: Awake/alert Behavior During Therapy: WFL for tasks assessed/performed Overall Cognitive Status: No family/caregiver present to determine baseline cognitive functioning                                 General Comments: pleasant, A&O, follows directions consistently though delayed response time noted.     General Comments  BP slightly elevated 150s/70s though stable pre and post activity. SpO2/HR WFL, no DOE    Exercises     Shoulder Instructions      Home Living Family/patient expects to be discharged to:: Private residence Living Arrangements: Other relatives (grandson) Available Help at Discharge: Family;Other (Comment) (near  24/7 per pt) Type of Home: House Home Access: Stairs to enter [employer] of Steps: 5 Entrance Stairs-Rails: None Home Layout: One level     Bathroom Shower/Tub: Walk-in shower;Tub/shower unit   Bathroom Toilet: Standard     Home Equipment: Conservation officer, nature (2 wheels);Cane - single point;Shower seat;Wheelchair - manual   Additional Comments: grandson works during the day, daughter is a Marine scientist and pt later reports likely to stay with her for a few days (?)      Prior Functioning/Environment Prior Level of Function : Independent/Modified Independent;Driving;History of Falls (last six months)             Mobility Comments: uses RW for mobility in the home. has not been going in/out of the house much d/t stair mgmt difficulty. working with [employer] currently. got w/c from previous admission but has not used ADLs Comments: reports able to manage showering, toileting and dressing self. assist for IADLs.        OT Problem List: Decreased strength;Decreased activity tolerance;Impaired balance (sitting and/or standing);Impaired vision/perception      OT Treatment/Interventions: Self-care/ADL training;Therapeutic exercise;Energy conservation;DME and/or AE instruction;Therapeutic activities    OT Goals(Current goals can be found in the care plan section) Acute Rehab OT Goals Patient Stated Goal: home soon, get back to walking better and without RW OT Goal Formulation: With patient Time For Goal Achievement: 10/29/22 Potential to Achieve Goals: Good  OT Frequency: Min 2X/week    Co-evaluation  AM-PAC OT "6 Clicks" Daily Activity     Outcome Measure Help from another person eating meals?: A Little Help from another person taking care of personal grooming?: A Little Help from another person toileting, which includes using toliet, bedpan, or urinal?: A Little Help from another person bathing (including washing, rinsing, drying)?: A Little Help from another  person to put on and taking off regular upper body clothing?: A Little Help from another person to put on and taking off regular lower body clothing?: A Little 6 Click Score: 18   End of Session Equipment Utilized During Treatment: Gait belt;Rolling walker (2 wheels) Nurse Communication: Mobility status  Activity Tolerance: Patient tolerated treatment well Patient left: in bed  OT Visit Diagnosis: Other abnormalities of gait and mobility (R26.89);Muscle weakness (generalized) (M62.81)                Time: 3888-2800 OT Time Calculation (min): 20 min Charges:  OT General Charges $OT Visit: 1 Visit OT Evaluation $OT Eval Low Complexity: 1 Low  Malachy Chamber, OTR/L Acute Rehab Services Office: [phone]   Layla Maw 10/15/2022, 8:14 AM

## 2022-10-15 NOTE — Evaluation (Signed)
Physical Therapy Evaluation Patient Details Name: Andre Stout MRN: [id number] DOB: 1940-11-12 Today's Date: 10/15/2022  History of Present Illness  Pt is an 82 y/o male who presented after experienced R sided weakness, R UE numbness and slurred speech. MRI brain negative. Workup for TIA. PMH: complete heart block, hypertension, type 2 diabetes, prostate cancer, glaucoma  Clinical Impression  Pt admitted secondary to problem above with deficits below. Only wanting to ambulate short distance within the room this session. Did note RUE weakness as it tended to fall off of RW. Required support to maintain positioning. Pt reports family can assist as needed. Recommending HHPT if family can provide necessary support. Will continue to follow acutely.        Recommendations for follow up therapy are one component of a multi-disciplinary discharge planning process, led by the attending physician.  Recommendations may be updated based on patient status, additional functional criteria and insurance authorization.  Follow Up Recommendations Home health PT (if family can provide necessary assist)      Assistance Recommended at Discharge Intermittent Supervision/Assistance  Patient can return home with the following  A little help with walking and/or transfers;A little help with bathing/dressing/bathroom;Assistance with cooking/housework;Assist for transportation;Help with stairs or ramp for entrance;Direct supervision/assist for medications management    Equipment Recommendations None recommended by PT  Recommendations for Other Services       Functional Status Assessment Patient has had a recent decline in their functional status and demonstrates the ability to make significant improvements in function in a reasonable and predictable amount of time.     Precautions / Restrictions Precautions Precautions: Fall;Other (comment) Precaution Comments: biliary drain Restrictions Weight Bearing  Restrictions: No      Mobility  Bed Mobility Overal bed mobility: Needs Assistance Bed Mobility: Supine to Sit, Sit to Supine     Supine to sit: Min assist, HOB elevated Sit to supine: Min assist   General bed mobility comments: handheld assist to lift trunk with Min A to get BLE back onto stretcher    Transfers Overall transfer level: Needs assistance Equipment used: Rolling walker (2 wheels) Transfers: Sit to/from Stand Sit to Stand: Min guard           General transfer comment: Min guard for safety to stand from elevated stretcher height    Ambulation/Gait Ambulation/Gait assistance: Min guard, Min assist Gait Distance (Feet): 3 Feet Assistive device: Rolling walker (2 wheels) Gait Pattern/deviations: Step-through pattern, Decreased stride length Gait velocity: Decreased     General Gait Details: only wanting to take a few steps at EOB this session. RUE falling off of RW and required assist to maintain. Pt reports that is new. Reports family can assist with walking as needed and also reports he has WC at home  Stairs            Wheelchair Mobility    Modified Rankin (Stroke Patients Only)       Balance Overall balance assessment: Needs assistance Sitting-balance support: No upper extremity supported, Feet supported Sitting balance-Leahy Scale: Fair     Standing balance support: Bilateral upper extremity supported, Reliant on assistive device for balance Standing balance-Leahy Scale: Poor                               Pertinent Vitals/Pain Pain Assessment Pain Assessment: No/denies pain    Home Living Family/patient expects to be discharged to:: Private residence Living Arrangements: Children;Other relatives (grandson) Available  Help at Discharge: Family;Available 24 hours/day Type of Home: House Home Access: Stairs to enter;Ramped entrance Entrance Stairs-Rails: Left;Right Entrance Stairs-Number of Steps: 5   Home Layout: One  level Home Equipment: Conservation officer, nature (2 wheels);Cane - single point;Shower seat;Wheelchair - manual Additional Comments: grandson works during the day, daughter is a Marine scientist and pt later reports likely to stay with her for a few days (?)    Prior Function Prior Level of Function : Needs assist             Mobility Comments: uses RW for mobility in the home. has not been going in/out of the house much d/t stair mgmt difficulty. working with [employer] currently. got w/c from previous admission but has not used. Grandson helps up the steps ADLs Comments: reports able to manage showering, toileting and dressing self. assist for IADLs.     Hand Dominance   Dominant Hand: Right    Extremity/Trunk Assessment   Upper Extremity Assessment Upper Extremity Assessment: Defer to OT evaluation (noted weakness in RUE and it began falling off of RW) RUE Deficits / Details: Reports sensation back to baseline and feels the same as L UE. Able to move UE in all planes, opposition intact though slow. finger to nose intact. With progressive activity with RW, pt R UE appeared to drop from RW and some inattention noted with activity (with fatigue?) RUE Sensation: WNL    Lower Extremity Assessment Lower Extremity Assessment: Generalized weakness    Cervical / Trunk Assessment Cervical / Trunk Assessment: Normal  Communication   Communication: No difficulties  Cognition Arousal/Alertness: Awake/alert Behavior During Therapy: WFL for tasks assessed/performed Overall Cognitive Status: No family/caregiver present to determine baseline cognitive functioning                                 General Comments: Slow processing noted. Questionable memory deficits        General Comments General comments (skin integrity, edema, etc.): BP slightly elevated 150s/70s though stable pre and post activity. SpO2/HR WFL, no DOE    Exercises     Assessment/Plan    PT Assessment Patient needs continued  PT services  PT Problem List Decreased strength;Decreased balance;Decreased activity tolerance;Decreased mobility;Decreased knowledge of precautions;Decreased cognition       PT Treatment Interventions DME instruction;Gait training;Stair training;Therapeutic activities;Functional mobility training;Therapeutic exercise;Balance training;Patient/family education    PT Goals (Current goals can be found in the Care Plan section)  Acute Rehab PT Goals Patient Stated Goal: to go home PT Goal Formulation: With patient Time For Goal Achievement: 10/29/22 Potential to Achieve Goals: Good    Frequency Min 4X/week     Co-evaluation               AM-PAC PT "6 Clicks" Mobility  Outcome Measure Help needed turning from your back to your side while in a flat bed without using bedrails?: None Help needed moving from lying on your back to sitting on the side of a flat bed without using bedrails?: A Little Help needed moving to and from a bed to a chair (including a wheelchair)?: A Little Help needed standing up from a chair using your arms (e.g., wheelchair or bedside chair)?: A Little Help needed to walk in hospital room?: A Little Help needed climbing 3-5 steps with a railing? : A Lot 6 Click Score: 18    End of Session Equipment Utilized During Treatment: Gait belt Activity Tolerance: Patient  limited by fatigue Patient left: in bed;with call bell/phone within reach (on stretcher in ED) Nurse Communication: Mobility status PT Visit Diagnosis: Unsteadiness on feet (R26.81);Muscle weakness (generalized) (M62.81)    Time: 9373-4287 PT Time Calculation (min) (ACUTE ONLY): 12 min   Charges:   PT Evaluation $PT Eval Low Complexity: 1 Low          Reuel Derby, PT, DPT  Acute Rehabilitation Services  Office: [phone]   Rudean Hitt 10/15/2022, 10:15 AM

## 2022-10-15 NOTE — ED Provider Notes (Signed)
[facility] EMERGENCY DEPARTMENT Provider Note   CSN: [id number] Arrival date & time: 10/15/22  0023  An emergency department physician performed an initial assessment on this suspected stroke patient at 0029.  History  Chief Complaint  Patient presents with   Weakness    Andre Stout is a 82 y.o. male.  Last seen well around 2215 when daughter left. When she came home he had right sided paralysis with dysarthria and couldn't get off the toilet. Fluctuating symptoms en route but persistently abnormal until just prior to arrival grip strength improved quite a bit.  Blood pressure stable in route with a high as systolic of 557.  Rest of vital signs within normal limits.  No known falls.  Limited history at this time secondary to acuity of situation in the code stroke scenario.        Home Medications Prior to Admission medications   Medication Sig Start Date End Date Taking? Authorizing Provider  acetaminophen (TYLENOL) 500 MG tablet Take 1,000 mg by mouth daily as needed (pain).   Yes [provider]  amLODipine (NORVASC) 10 MG tablet Take 1 tablet (10 mg total) by mouth daily. 08/24/22 10/16/23 Yes Antonieta Pert, MD  aspirin 81 MG chewable tablet Chew 1 tablet (81 mg total) by mouth daily. Patient taking differently: Chew 81 mg by mouth at bedtime. 01/06/22  Yes Swayze, Ava, DO  brimonidine (ALPHAGAN) 0.2 % ophthalmic solution Place 1 drop into both eyes 2 (two) times daily with breakfast and lunch. 12/18/21  Yes [provider]  dorzolamide-timolol (COSOPT) 22.3-6.8 MG/ML ophthalmic solution Place 1 drop into both eyes 2 (two) times daily with breakfast and lunch. 05/18/22  Yes [provider]  insulin NPH-regular Human (70-30) 100 UNIT/ML injection Inject 20 Units into the skin 2 (two) times daily with a meal. 08/27/22  Yes Kc, Ramesh, MD  latanoprost (XALATAN) 0.005 % ophthalmic solution Place 1 drop into both eyes at bedtime. 01/01/22  Yes  [provider]  lisinopril (ZESTRIL) 20 MG tablet Take 1 tablet (20 mg total) by mouth daily. 08/24/22 10/16/23 Yes Antonieta Pert, MD  magnesium oxide (MAG-OX) 400 (240 Mg) MG tablet Take 1 tablet (400 mg total) by mouth at bedtime. 08/30/22 08/25/23 Yes [name], FNP  Multiple Vitamins-Minerals (PRESERVISION AREDS 2) CAPS Take 1 capsule by mouth at bedtime.   Yes [provider]  pantoprazole (PROTONIX) 40 MG tablet Take 1 tablet (40 mg total) by mouth 2 (two) times daily before a meal. 10/05/22 01/03/23 Yes [name], FNP  sodium chloride flush 0.9 % SOLN injection Inject 5 mLs into the vein as needed for up to 30 doses. Discard remainder of syringe. Patient taking differently: Inject 5 mLs into the vein daily. 08/24/22  Yes Antonieta Pert, MD  sucralfate (CARAFATE) 1 GM/10ML suspension Take 10 mLs (1 g total) by mouth 4 (four) times daily -  with meals and at bedtime. 10/05/22  Yes [name], FNP  BD INSULIN SYRINGE U/F 31G X 5/16" 1 ML MISC USE 2 DAILY 08/24/21   Renato Shin, MD  Blood Glucose Monitoring Suppl (ACCU-CHEK GUIDE) w/Device KIT Use As Directed 08/24/22   Antonieta Pert, MD  cetirizine (ZYRTEC) 10 MG tablet Take 10 mg by mouth daily as needed (seasonal allergies). Patient not taking: Reported on 10/15/2022    [provider]  [facility] DELICA LANCETS 32K MISC Use to check blood sugar 4 times per day. Dx code: E11.9 03/27/16   Renato Shin,  MD  ONETOUCH ULTRA test strip USE TO MONITOR GLUCOSE LEVELS 4 TIMES PER DAY E11.9 06/10/19   Renato Shin, MD      Allergies    Patient has no known allergies.    Review of Systems   Review of Systems  Physical Exam Updated Vital Signs BP 137/65   Pulse 67   Temp 97.9 F (36.6 C) (Oral)   Resp 13   Ht 5' 5"  (1.651 m)   Wt 85.4 kg   SpO2 99%   BMI 31.33 kg/m  Physical Exam  ED Results / Procedures / Treatments   Labs (all labs ordered are listed, but only abnormal results are  displayed) Labs Reviewed  DIFFERENTIAL - Abnormal; Notable for the following components:      Result Value   Eosinophils Absolute 0.6 (*)    All other components within normal limits  COMPREHENSIVE METABOLIC PANEL - Abnormal; Notable for the following components:   Sodium 134 (*)    CO2 19 (*)    Glucose, Bld 171 (*)    Creatinine, Ser 1.45 (*)    Albumin 3.4 (*)    AST 13 (*)    GFR, Estimated 48 (*)    All other components within normal limits  I-STAT CHEM 8, ED - Abnormal; Notable for the following components:   Creatinine, Ser 1.50 (*)    Glucose, Bld 168 (*)    All other components within normal limits  CBG MONITORING, ED - Abnormal; Notable for the following components:   Glucose-Capillary 170 (*)    All other components within normal limits  PROTIME-INR  APTT  CBC  ETHANOL  MAGNESIUM  LIPID PANEL  HEMOGLOBIN A1C    EKG EKG Interpretation  Date/Time:  Monday October 15 2022 00:38:53 EDT Ventricular Rate:  75 PR Interval:    QRS Duration: 103 QT Interval:  381 QTC Calculation: 426 R Axis:   -32 Text Interpretation: AV block, complete (third degree) Left axis deviation Borderline T abnormalities, inferior leads Confirmed by Merrily Pew [phone]) on 10/15/2022 12:43:05 AM  Radiology MR BRAIN WO CONTRAST  Result Date: 10/15/2022 CLINICAL DATA:  Right-sided weakness EXAM: MRI HEAD WITHOUT CONTRAST TECHNIQUE: Multiplanar, multiecho pulse sequences of the brain and surrounding structures were obtained without intravenous contrast. COMPARISON:  08/01/2022 FINDINGS: Brain: No acute infarct, mass effect or extra-axial collection. No acute or chronic hemorrhage. Faint areas of hyperintensity on diffusion-weighted imaging in the left frontal white matter likely are due to T2 shine through effects. There is multifocal periventricular white matter hyperintensity, most often a result of chronic microvascular ischemia. There is generalized atrophy without lobar predilection. The  midline structures are normal. 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 Vascular: Major flow voids are preserved. Skull and upper cervical spine: Normal calvarium and skull base. Visualized upper cervical spine and soft tissues are normal. Sinuses/Orbits:No paranasal sinus fluid levels or advanced mucosal thickening. No mastoid or middle ear effusion. Normal orbits. IMPRESSION: 1. No acute intracranial abnormality. 2. Generalized atrophy and findings of chronic small vessel ischemia. 3. Unchanged right CP angle meningioma. Electronically Signed   By: Ulyses Jarred M.D.   On: 10/15/2022 01:26   CT HEAD CODE STROKE WO CONTRAST  Result Date: 10/15/2022 CLINICAL DATA:  Code stroke.  Right-sided weakness EXAM: CT HEAD WITHOUT CONTRAST TECHNIQUE: Contiguous axial images were obtained from the base of the skull through the vertex without intravenous contrast. RADIATION DOSE REDUCTION: This exam was performed according to the departmental dose-optimization  program which includes automated exposure control, adjustment of the mA and/or kV according to patient size and/or use of iterative reconstruction technique. COMPARISON:  None Available. FINDINGS: Brain: There is no mass, hemorrhage or extra-axial collection. There is generalized atrophy without lobar predilection. There is hypoattenuation of the periventricular white matter, most commonly indicating chronic ischemic microangiopathy. Vascular: No abnormal hyperdensity of the major intracranial arteries or dural venous sinuses. No intracranial atherosclerosis. Skull: The visualized skull base, calvarium and extracranial soft tissues are normal. Sinuses/Orbits: No fluid levels or advanced mucosal thickening of the visualized paranasal sinuses. No mastoid or middle ear effusion. The orbits are normal. ASPECTS [facility] Stroke Program Early CT Score) - Ganglionic level infarction (caudate, lentiform nuclei, internal capsule, insula,  M1-M3 cortex): 7 - Supraganglionic infarction (M4-M6 cortex): 3 Total score (0-10 with 10 being normal): 10 IMPRESSION: 1. No acute intracranial abnormality. 2. ASPECTS is 10 These results were communicated to Dr. Donnetta Simpers at 12:38 am on 10/15/2022 by text page via the [facility] messaging system. Electronically Signed   By: Ulyses Jarred M.D.   On: 10/15/2022 00:38    Procedures Procedures    Medications Ordered in ED Medications  sodium chloride flush (NS) 0.9 % injection 3 mL (3 mLs Intravenous Not Given 10/15/22 0125)  enoxaparin (LOVENOX) injection 40 mg (has no administration in time range)  aspirin EC tablet 81 mg (has no administration in time range)  clopidogrel (PLAVIX) tablet 75 mg (has no administration in time range)  pantoprazole (PROTONIX) EC tablet 40 mg (has no administration in time range)  sucralfate (CARAFATE) 1 GM/10ML suspension 1 g (has no administration in time range)  atorvastatin (LIPITOR) tablet 80 mg (80 mg Oral Given 10/15/22 0641)  acetaminophen (TYLENOL) tablet 650 mg (has no administration in time range)  ondansetron (ZOFRAN) injection 4 mg (has no administration in time range)  polyethylene glycol (MIRALAX / GLYCOLAX) packet 17 g (has no administration in time range)  melatonin tablet 5 mg (has no administration in time range)  0.9 %  sodium chloride infusion (0 mLs Intravenous Paused 10/15/22 0655)  insulin aspart (novoLOG) injection 0-9 Units (has no administration in time range)  insulin aspart (novoLOG) injection 0-5 Units (has no administration in time range)  lactated ringers bolus 1,000 mL (0 mLs Intravenous Stopped 10/15/22 0319)    ED Course/ Medical Decision Making/ A&P                           Medical Decision Making Amount and/or Complexity of Data Reviewed Labs: ordered. Radiology: ordered.  Risk Decision regarding hospitalization.   Concern for TIA. Neuro recommended admit to medicine.   Final Clinical Impression(s) / ED  Diagnoses Final diagnoses:  None    Rx / DC Orders ED Discharge Orders     None         Denarius Sesler, Corene Cornea, MD 10/15/22 [phone]

## 2022-10-15 NOTE — Consult Note (Addendum)
NEUROLOGY CONSULTATION NOTE   Date of service: October 15, 2022 Patient Name: Andre Stout MRN:  [id number] DOB:  15-Nov-1940 Reason for consult: "R sided weakness" Requesting Provider: Merrily Pew, MD _ _ _   _ __   _ __ _ _  __ __   _ __   __ _  History of Present Illness  Andre Stout is a 82 y.o. male with PMH significant for HTN, prior complete heart block, prostrate cancer, hx of glaucoma who developed sudden onset R sided weakness with a [place] of 2215.  Daughter talked to him and left and he was fine and around 2300 was messing around with his dentures. He called for his daughter and he was unable to move his R arm and reported R arm numbness.  His daughter called EMS and he was noted to have no movement in RUE and RLE and R facial droop. His symptoms resolved about 10 mins prio to arrival to the ED.  On screening Neuro exam, he was found to have R upper quadrantanopsia. He is unsure of when the vision deficit started. He reports not being aware of the vision deficit prior to my evaluation.  He has a hx of macular degeneration and glaucoma.  LKW: 2215 on 10/14/22. mRS: 2 tNKASE: not offered, no stroke on MRI Thrombectomy: not offered, no stroke on MRI. NIHSS components Score: Comment  1a Level of Conscious 0'[x]'$  1'[]'$  2'[]'$  3'[]'$      1b LOC Questions 0'[x]'$  1'[]'$  2'[]'$       1c LOC Commands 0'[x]'$  1'[]'$  2'[]'$       2 Best Gaze 0'[x]'$  1'[]'$  2'[]'$       3 Visual 0'[]'$  1'[x]'$  2'[]'$  3'[]'$    Right superior quadrantanopsia.  4 Facial Palsy 0'[x]'$  1'[]'$  2'[]'$  3'[]'$      5a Motor Arm - left 0'[x]'$  1'[]'$  2'[]'$  3'[]'$  4'[]'$  UN'[]'$    5b Motor Arm - Right 0'[x]'$  1'[]'$  2'[]'$  3'[]'$  4'[]'$  UN'[]'$    6a Motor Leg - Left 0'[x]'$  1'[]'$  2'[]'$  3'[]'$  4'[]'$  UN'[]'$    6b Motor Leg - Right 0'[x]'$  1'[]'$  2'[]'$  3'[]'$  4'[]'$  UN'[]'$    7 Limb Ataxia 0'[x]'$  1'[]'$  2'[]'$  3'[]'$  UN'[]'$     8 Sensory 0'[x]'$  1'[]'$  2'[]'$  UN'[]'$      9 Best Language 0'[x]'$  1'[]'$  2'[]'$  3'[]'$      10 Dysarthria 0'[x]'$  1'[]'$  2'[]'$  UN'[]'$      11 Extinct. and Inattention 0'[x]'$  1'[]'$  2'[]'$       TOTAL: 1     ROS   Constitutional Denies weight loss, fever and  chills.   HEENT Denies changes in vision and hearing.  Respiratory Denies SOB and cough.   CV Denies palpitations and CP   GI Denies abdominal pain, nausea, vomiting and diarrhea.   GU Denies dysuria and urinary frequency.   MSK Denies myalgia and joint pain.   Skin Denies rash and pruritus.   Neurological Denies headache and syncope.   Psychiatric Denies recent changes in mood. Denies anxiety and depression.    Past History   Past Medical History:  Diagnosis Date   Diabetes mellitus without complication ([place])    Glaucoma    Heart block    complete heart block   Hypertension    Prostate cancer ([place])    Been 3-4 years ago   Past Surgical History:  Procedure Laterality Date   CATARACT EXTRACTION Bilateral    INSERTION PROSTATE RADIATION SEED     IR PERC CHOLECYSTOSTOMY  08/17/2022   IR RADIOLOGIST EVAL & MGMT  09/28/2022   IR RADIOLOGIST EVAL &  MGMT  10/10/2022   Family History  Problem Relation Age of Onset   Diabetes Father    Diabetes Paternal Grandfather    Social History   Socioeconomic History   Marital status: Widowed    Spouse name: Not on file   Number of children: 1   Years of education: 35   Highest education level: Not on file  Occupational History   Occupation: Retired  Tobacco Use   Smoking status: Former    Packs/day: 0.50    Years: 4.00    Total pack years: 2.00    Types: Cigarettes    Quit date: 10/29/1974    Years since quitting: 47.9   Smokeless tobacco: Never  Vaping Use   Vaping Use: Never used  Substance and Sexual Activity   Alcohol use: No   Drug use: No   Sexual activity: Not Currently  Other Topics Concern   Not on file  Social History Narrative   Born and raised in [place], [place]. Currently reside in a private residence by himself. Daughter lives in the area. No live. Fun: hunt and fish   Denies religious beliefs that would effect health care.    Social Determinants of Health   Financial Resource Strain: Low Risk  (03/26/2022)    Overall Financial Resource Strain (CARDIA)    Difficulty of Paying Living Expenses: Not hard at all  Food Insecurity: No Food Insecurity (03/26/2022)   Hunger Vital Sign    Worried About Running Out of Food in the Last Year: Never true    Ran Out of Food in the Last Year: Never true  Transportation Needs: No Transportation Needs (03/26/2022)   PRAPARE - Hydrologist (Medical): No    Lack of Transportation (Non-Medical): No  Physical Activity: Inactive (03/26/2022)   Exercise Vital Sign    Days of Exercise per Week: 0 days    Minutes of Exercise per Session: 0 min  Stress: No Stress Concern Present (03/26/2022)   [place]    Feeling of Stress : Not at all  Social Connections: Unknown (05/28/2019)   Social Connection and Isolation Panel [NHANES]    Frequency of Communication with Friends and Family: More than three times a week    Frequency of Social Gatherings with Friends and Family: More than three times a week    Attends Religious Services: Not on file    Active Member of Clubs or Organizations: Yes    Attends Archivist Meetings: More than 4 times per year    Marital Status: Widowed   No Known Allergies  Medications  (Not in a hospital admission)    Vitals   There were no vitals filed for this visit.   There is no height or weight on file to calculate BMI.  Physical Exam   General: Laying comfortably in bed; in no acute distress.  HENT: Normal oropharynx and mucosa. Normal external appearance of ears and nose.  Neck: Supple, no pain or tenderness  CV: No JVD. No peripheral edema.  Pulmonary: Symmetric Chest rise. Normal respiratory effort.  Abdomen: Soft to touch, non-tender.  Ext: No cyanosis, edema, or deformity  Skin: No rash. Normal palpation of skin.   Musculoskeletal: Normal digits and nails by inspection. No clubbing.   Neurologic Examination  Mental  status/Cognition: Alert, oriented to self, place, month and year, good attention.  Speech/language: Fluent, comprehension intact, object naming intact, repetition intact.  Cranial nerves:  CN II Pupils equal and reactive to light, right superior quadrantanopsia.   CN III,IV,VI EOM intact, no gaze preference or deviation, no nystagmus    CN V normal sensation in V1, V2, and V3 segments bilaterally    CN VII no asymmetry, no nasolabial fold flattening    CN VIII normal hearing to speech    CN IX & X normal palatal elevation, no uvular deviation    CN XI 5/5 head turn and 5/5 shoulder shrug bilaterally    CN XII midline tongue protrusion    Motor:  Muscle bulk: normal, tone normal, pronator drift none tremor none Mvmt Root Nerve  Muscle Right Left Comments  SA C5/6 Ax Deltoid 5 5   EF C5/6 Mc Biceps 5 5   EE C6/7/8 Rad Triceps 5 5   WF C6/7 Med FCR     WE C7/8 PIN ECU     F Ab C8/T1 U ADM/FDI 5 5   HF L1/2/3 Fem Illopsoas 5 5   KE L2/3/4 Fem Quad 5 5   DF L4/5 D Peron Tib Ant 5 5   PF S1/2 Tibial Grc/Sol 5 5    Reflexes:  Right Left Comments  Pectoralis      Biceps (C5/6) 2 2   Brachioradialis (C5/6) 2 2    Triceps (C6/7) 2 2    Patellar (L3/4) 2 2    Achilles (S1)      Hoffman      Plantar     Jaw jerk    Sensation:  Light touch Intact throughout   Pin prick    Temperature    Vibration   Proprioception    Coordination/Complex Motor:  - Finger to Nose intact bilaterally - Heel to shin intact bilaterally - Rapid alternating movement intact bilaterally - Gait: Deferred for patient's safety Labs   CBC:  Recent Labs  Lab 10/15/22 0026 10/15/22 0031  WBC 9.4  --   NEUTROABS 4.0  --   HGB 13.1 13.6  HCT 41.1 40.0  MCV 84.6  --   PLT 228  --     Basic Metabolic Panel:  Lab Results  Component Value Date   NA 136 10/15/2022   K 4.3 10/15/2022   CO2 27 08/30/2022   GLUCOSE 168 (H) 10/15/2022   BUN 22 10/15/2022   CREATININE 1.50 (H) 10/15/2022   CALCIUM  9.1 08/30/2022   GFRNONAA >60 08/27/2022   GFRAA >60 03/07/2016   Lipid Panel:  Lab Results  Component Value Date   LDLCALC 104 (H) 01/03/2022   HgbA1c:  Lab Results  Component Value Date   HGBA1C 8.3 (H) 08/02/2022   Urine Drug Screen: No results found for: "LABOPIA", "COCAINSCRNUR", "LABBENZ", "AMPHETMU", "THCU", "LABBARB"  Alcohol Level No results found for: "ETH"  CT Head without contrast(Personally reviewed): CTH was negative for a large hypodensity concerning for a large territory infarct or hyperdensity concerning for an ICH  MR Angio head without contrast and Carotid Duplex BL: pending  MRI Brain(Personally reviewed): Subacute left MCA stroke   Impression   Andre Stout is a 82 y.o. male with PMH significant for with PMH significant for HTN, prior complete heart block, prostrate cancer, hx of glaucoma who developed sudden onset R sided weakness with a [place] of 2215.  Symptoms that spontaneously resolved before his arrival to the ED.  On neuro exam, he was noted to have right superior quadrantanopsia of unclear duration.  Decision was made to get stat MRI to clarify if this  vision deficit is indeed acute stroke.  MRI brain was negative for an acute stroke.  Suspect that the transient episode of R sided weakness was probably a TIA.  Daughter reports that he has chroic vision complaints and follows optho for glaucoma, diabetes eye exam and for macular degeneration.  Recommendations   - Frequent Neuro checks per stroke unit protocol - Recommend brain imaging with MRI Brain without contrast - Recommend Vascular imaging with MRA Angio Head without contrast and US Carotid doppler - Recommend obtaining TTE - Recommend obtaining Lipid panel with LDL - Please start statin if LDL > 70 - Recommend HbA1c - Antithrombotic - Aspirin 81 mg daily along with Plavix 75 mg daily for 21 days, followed by aspirin 81 mg daily alone - Recommend DVT ppx - SBP goal - permissive  hypertension first 24 h < 220/110. Held home meds.  - Recommend Telemetry monitoring for arrythmia - Recommend bedside swallow screen prior to PO intake. - Stroke education booklet - Recommend PT/OT/SLP consult  - Follow up with optho for the concern for R superior quadrantanopsia.  ______________________________________________________________________   Thank you for the opportunity to take part in the care of this patient. If you have any further questions, please contact the neurology consultation attending.  Signed,  [place] Pager Number [phone] _ _ _   _ __   _ __ _ _  __ __   _ __   __ _

## 2022-10-15 NOTE — ED Notes (Signed)
Pt transported to vascular.  °

## 2022-10-15 NOTE — ED Notes (Signed)
Pt in CT scan.

## 2022-10-15 NOTE — ED Notes (Signed)
Pt returned from MRI °

## 2022-10-15 NOTE — ED Notes (Signed)
Ordered hospital bed for pt 

## 2022-10-15 NOTE — ED Notes (Signed)
Echo at bedside

## 2022-10-15 NOTE — Progress Notes (Signed)
Interventional Radiology Brief Note:  IR contacted by surgery for possible capping trial of perc chole drain placed 08/21/22.  Drain recently evaluated by Dr. Serafina Royals 10/10/22 and found ducts patent on cholangiogram. PA to bedside.  Patient denies abdominal pain, nausea, vomiting.  Gravity bag with green, bilious output.  Drain capped.  Gravity left in bag with RN for emptying. Keep gravity bag available in the event patient develops symptoms of recurrent cholecystitis.   Brynda Greathouse, MS RD PA-C

## 2022-10-15 NOTE — ED Notes (Signed)
Pt's daughter Gilardi) updated

## 2022-10-15 NOTE — Progress Notes (Signed)
SLP Cancellation Note  Patient Details Name: Andre Stout MRN: [id number] DOB: [birth year]   Cancelled treatment:       Reason Eval/Treat Not Completed: Other (comment). Order received for swallow eval. Note that pt has passed the [facility] swallow screen and per RN is without difficulties so far this am. SLP to defer swallow eval at this time; however, would consider ordering SLP cognitive-linguistic eval in the setting of acute neurological symptoms.     Osie Bond., M.A. [place] Office [phone]  Secure chat preferred  10/15/2022, 9:16 AM

## 2022-10-15 NOTE — Progress Notes (Addendum)
STROKE TEAM PROGRESS NOTE   INTERVAL HISTORY Patient reports right hand and arm numbness that occurred prior to arriving to the hospital. He notes the feeling going away but during his session with PT, the numbness in his hand returned. He denies slurred speech and facial droop.  Vitals:   10/15/22 0800 10/15/22 0838 10/15/22 1200 10/15/22 1230  BP: 139/84  130/62 129/64  Pulse: 69  62 (!) 57  Resp: '19  20 16  '$ Temp:  97.9 F (36.6 C)  98.4 F (36.9 C)  TempSrc:    Oral  SpO2: 98%  99% 100%  Weight:      Height:       CBC:  Recent Labs  Lab 10/15/22 0026 10/15/22 0031  WBC 9.4  --   NEUTROABS 4.0  --   HGB 13.1 13.6  HCT 41.1 40.0  MCV 84.6  --   PLT 228  --    Basic Metabolic Panel:  Recent Labs  Lab 10/15/22 0026 10/15/22 0031  NA 134* 136  K 4.1 4.3  CL 103 105  CO2 19*  --   GLUCOSE 171* 168*  BUN 20 22  CREATININE 1.45* 1.50*  CALCIUM 9.5  --   MG 1.8  --    Lipid Panel: No results for input(s): "CHOL", "TRIG", "HDL", "CHOLHDL", "VLDL", "LDLCALC" in the last 168 hours. HgbA1c: No results for input(s): "HGBA1C" in the last 168 hours. Urine Drug Screen: No results for input(s): "LABOPIA", "COCAINSCRNUR", "LABBENZ", "AMPHETMU", "THCU", "LABBARB" in the last 168 hours.  Alcohol Level  Recent Labs  Lab 10/15/22 0026  ETH <10    IMAGING past 24 hours VAS US CAROTID  Result Date: 10/15/2022 Carotid Arterial Duplex Study Patient Name:  Andre Stout  Date of Exam:   10/15/2022 Medical Rec #: [id number]       Accession #:    [id number] Date of Birth: 1940/12/21      Patient Gender: M Patient Age:   24 years Exam Location:  [facility] Procedure:      VAS US CAROTID Referring Phys: Irene Pap --------------------------------------------------------------------------------  Indications:       TIA. Risk Factors:      Hypertension, Diabetes. Comparison Study:  no prior Performing Technologist: Archie Patten RVS  Examination Guidelines: A complete  evaluation includes B-mode imaging, spectral Doppler, color Doppler, and power Doppler as needed of all accessible portions of each vessel. Bilateral testing is considered an integral part of a complete examination. Limited examinations for reoccurring indications may be performed as noted.  Right Carotid Findings: +----------+--------+--------+--------+------------------+--------+           PSV cm/sEDV cm/sStenosisPlaque DescriptionComments +----------+--------+--------+--------+------------------+--------+ CCA Prox  112     10              heterogenous               +----------+--------+--------+--------+------------------+--------+ CCA Distal71      8               heterogenous               +----------+--------+--------+--------+------------------+--------+ ICA Prox  67      14      1-39%   heterogenous               +----------+--------+--------+--------+------------------+--------+ ICA Distal55      11                                         +----------+--------+--------+--------+------------------+--------+  ECA       128                                                +----------+--------+--------+--------+------------------+--------+ +----------+--------+-------+--------+-------------------+           PSV cm/sEDV cmsDescribeArm Pressure (mmHG) +----------+--------+-------+--------+-------------------+ Subclavian170                                        +----------+--------+-------+--------+-------------------+ +---------+--------+--+--------+-+---------+ VertebralPSV cm/s45EDV cm/s8Antegrade +---------+--------+--+--------+-+---------+  Left Carotid Findings: +----------+--------+--------+--------+------------------+--------+           PSV cm/sEDV cm/sStenosisPlaque DescriptionComments +----------+--------+--------+--------+------------------+--------+ CCA Prox  127     18              heterogenous                +----------+--------+--------+--------+------------------+--------+ CCA Distal104     13              heterogenous               +----------+--------+--------+--------+------------------+--------+ ICA Prox  65      15      1-39%   heterogenous               +----------+--------+--------+--------+------------------+--------+ ICA Distal55      12                                         +----------+--------+--------+--------+------------------+--------+ ECA       108                                                +----------+--------+--------+--------+------------------+--------+ +----------+--------+--------+--------+-------------------+           PSV cm/sEDV cm/sDescribeArm Pressure (mmHG) +----------+--------+--------+--------+-------------------+ [id number]                                         +----------+--------+--------+--------+-------------------+ +---------+--------+--+--------+--+---------+ VertebralPSV cm/s45EDV cm/s10Antegrade +---------+--------+--+--------+--+---------+   Summary: Right Carotid: Velocities in the right ICA are consistent with a 1-39% stenosis. Left Carotid: Velocities in the left ICA are consistent with a 1-39% stenosis. Vertebrals: Bilateral vertebral arteries demonstrate antegrade flow. *See table(s) above for measurements and observations.  Electronically signed by Antony Contras MD on 10/15/2022 at 10:41:29 AM.    Final    MR ANGIO HEAD WO CONTRAST  Result Date: 10/15/2022 CLINICAL DATA:  82 year old male with right side weakness. MRI head earlier today negative for acute infarct. EXAM: MRA HEAD WITHOUT CONTRAST TECHNIQUE: Angiographic images of the Circle of Willis were acquired using MRA technique without intravenous contrast. COMPARISON:  Brain MRI 0058 hours today. FINDINGS: Anterior circulation: Antegrade flow in both ICA siphons. Mild siphon irregularity, no convincing siphon stenosis. Patent carotid termini. MCA and ACA  origins appear normal. Anterior communicating artery is within normal limits. The left ACA is mildly dominant throughout. ACA branches are within normal limits. MCA M1 segments and MCA bi/trifurcations are patent without stenosis. Visible bilateral MCA branches are within normal limits. Posterior circulation: Antegrade  flow in the posterior circulation with patent codominant distal vertebral arteries and vertebrobasilar junction without evidence of stenosis. Patent PICA origins. Patent basilar artery with mild to moderate irregularity and stenosis at the distal 3rd of the basilar, series 1055, image 11). Patent basilar tip, SCA and PCA origins. Visible PCA branches are within normal limits. Anatomic variants: None significant. Other: Right cerebellopontine angle mass, see comparison MRI. 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 Electronically Signed   By: Genevie Ann M.D.   On: 10/15/2022 08:34   MR BRAIN WO CONTRAST  Result Date: 10/15/2022 CLINICAL DATA:  Right-sided weakness EXAM: MRI HEAD WITHOUT CONTRAST TECHNIQUE: Multiplanar, multiecho pulse sequences of the brain and surrounding structures were obtained without intravenous contrast. COMPARISON:  08/01/2022 FINDINGS: Brain: No acute infarct, mass effect or extra-axial collection. No acute or chronic hemorrhage. Faint areas of hyperintensity on diffusion-weighted imaging in the left frontal white matter likely are due to T2 shine through effects. There is multifocal periventricular white matter hyperintensity, most often a result of chronic microvascular ischemia. There is generalized atrophy without lobar predilection. The midline structures are normal. 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 Vascular: Major flow voids are preserved. Skull and upper cervical spine: Normal calvarium and  skull base. Visualized upper cervical spine and soft tissues are normal. Sinuses/Orbits:No paranasal sinus fluid levels or advanced mucosal thickening. No mastoid or middle ear effusion. Normal orbits. IMPRESSION: 1. No acute intracranial abnormality. 2. Generalized atrophy and findings of chronic small vessel ischemia. 3. Unchanged right CP angle meningioma. Electronically Signed   By: Ulyses Jarred M.D.   On: 10/15/2022 01:26   CT HEAD CODE STROKE WO CONTRAST  Result Date: 10/15/2022 CLINICAL DATA:  Code stroke.  Right-sided weakness EXAM: CT HEAD WITHOUT CONTRAST TECHNIQUE: Contiguous axial images were obtained from the base of the skull through the vertex without intravenous contrast. RADIATION DOSE REDUCTION: This exam was performed according to the departmental dose-optimization program which includes automated exposure control, adjustment of the mA and/or kV according to patient size and/or use of iterative reconstruction technique. COMPARISON:  None Available. FINDINGS: Brain: There is no mass, hemorrhage or extra-axial collection. There is generalized atrophy without lobar predilection. There is hypoattenuation of the periventricular white matter, most commonly indicating chronic ischemic microangiopathy. Vascular: No abnormal hyperdensity of the major intracranial arteries or dural venous sinuses. No intracranial atherosclerosis. Skull: The visualized skull base, calvarium and extracranial soft tissues are normal. Sinuses/Orbits: No fluid levels or advanced mucosal thickening of the visualized paranasal sinuses. No mastoid or middle ear effusion. The orbits are normal. ASPECTS [facility] Stroke Program Early CT Score) - Ganglionic level infarction (caudate, lentiform nuclei, internal capsule, insula, M1-M3 cortex): 7 - Supraganglionic infarction (M4-M6 cortex): 3 Total score (0-10 with 10 being normal): 10 IMPRESSION: 1. No acute intracranial abnormality. 2. ASPECTS is 10 These results were communicated  to Dr. Donnetta Simpers at 12:38 am on 10/15/2022 by text page via the [facility] messaging system. Electronically Signed   By: Ulyses Jarred M.D.   On: 10/15/2022 00:38    PHYSICAL EXAM General: Pleasant, well-appearing  in bed. No acute distress. Skin: Warm and dry. No obvious rash or lesions. Neuro: A&Ox3. CN II-XII intact. For peripheral vision, decreased acuity bilaterally in upper quadrant. Decreased fine hand motor movement on the right hand. No cerebellar abnormalities. Moves all extremities. Normal ROM. 5/5 strength  in RUE, LUE, RLE, and LLE. Grip strength weak on the R side. Normal sensation. No dysarthria. Psych: Normal mood and affect   ASSESSMENT/PLAN Andre Stout is a 82 y.o. male with PMH significant for HTN, prior complete heart block, prostrate cancer, hx of glaucoma who developed sudden onset R sided weakness with a [place] of 2215 on 10/16.  Stroke vs. TIA:  faint DWI signal at left CR area, MRI repeat pending Etiology:  small vessel disease  Code Stroke CT head No acute abnormality. ASPECTS 10.    MRI  No acute intracranial abnormality. R CPA unchanged meningioma Repeat MRI as the first MRI was taken about two hours since symptom onset  MRA No large vessel occlusion and generally mild for age intracranial atherosclerosis, but there is isolated mild to moderate stenosis in the distal 3rd Basilar artery. Carotid Doppler  carotids have no significant stenosis 2D Echo EF 60-65% LDL 104 HgbA1c 8.4 VTE prophylaxis - lovenox aspirin 81 mg daily prior to admission, now on aspirin 81 mg daily and clopidogrel 75 mg daily DAPT for three weeks and then clopidogrel alone afterwards. Therapy recommendations:  home health PT/OT Disposition:  home  Hypertension Home meds:  norvasc '10mg'$ , lisinopril '20mg'$  Stable Long-term BP goal normotensive  Hyperlipidemia Home meds:  none LDL 104, goal < 70 Start lipitor 40 mg Continue statin at discharge  Diabetes type II  Uncontrolled Glaucoma Home meds:  insulin 20U BID HgbA1c 8.4, goal < 7.0 CBGs SSI Close PCP follow-up for better DM control  Other Stroke Risk Factors Advanced Age >/= 76  Obesity, Body mass index is 31.33 kg/m., BMI >/= 30 associated with increased stroke risk, recommend weight loss, diet and exercise as appropriate   Other Active Problems  Cholecystitis s/p drainage  Surgery on board Drain capped today Follow up with surgery   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 Start NS at 50 cc/h x 1 day. Monitor urine output with strict I's and O's Avoid nephrotoxic agents, dehydration and hypotension. Repeat renal panel in the morning  GERD Resume home PPI, sucralfate  Hospital day # 0  Stormy Fabian, MD PGY-1 Psychiatry  ATTENDING NOTE: I reviewed above note and agree with the assessment and plan. Pt was seen and examined.   82 year old male with history of hypertension, complete heart block, prostate cancer, glaucoma, cholecystitis status post drainage in 07/2022 admitted for right-sided weakness, right facial droop.  CT no acute abnormality.  MRI showed no acute infarct but left CR area faint DWI signal, unchanged right CP angle meningioma.  MRA showed mid BA stenosis.  Carotid Doppler unremarkable.  EF 60 to 65%, LDL 104, A1c 8.4.  Creatinine 1.45-1.50.  On exam, patient lying in bed, awake alert oriented x3, mild dysarthria, no aphasia, follows simple commands.  Able to name and repeat.  No gaze palsy, visual field full with bilateral upper quadrants decreased visual acuity, no hemianopia, no facial droop.  Right hand grip and dexterity decreased.  Otherwise motor strength symmetrical.   Sensation symmetrical, finger-to-nose intact.  Etiology for patient symptoms concerning for stroke with early change on MRI, will repeat MRI in a.m.  Currently on DAPT for 3 weeks and then Plavix alone.  Continue statin.  PT/OT recommend home health.  Heart block followed by cardiology as outpatient.  Currently heart rate 60s, per cardiology, no indication for pacemaker at this time.  We will follow.  For detailed assessment and plan, please refer to above/below as I have made changes wherever appropriate.   Rosalin Hawking, MD PhD Stroke Neurology 10/15/2022 9:43 PM    To contact Stroke Continuity provider, please refer to [email or web address] After hours, contact General Neurology

## 2022-10-15 NOTE — Care Management Obs Status (Signed)
[place] NOTIFICATION   Patient Details  Name: REEF ACHTERBERG MRN: [id number] Date of Birth: July 10, 1940   Medicare Observation Status Notification Given:  Ernesta Amble, RN 10/15/2022, 11:31 PM

## 2022-10-16 ENCOUNTER — Observation Stay (HOSPITAL_BASED_OUTPATIENT_CLINIC_OR_DEPARTMENT_OTHER): Payer: Medicare Other

## 2022-10-16 ENCOUNTER — Observation Stay (HOSPITAL_COMMUNITY): Payer: Medicare Other

## 2022-10-16 ENCOUNTER — Inpatient Hospital Stay (HOSPITAL_BASED_OUTPATIENT_CLINIC_OR_DEPARTMENT_OTHER)
Admit: 2022-10-16 | Discharge: 2022-10-16 | Disposition: A | Discharge status: Home / Self Care | Payer: Medicare Other | Attending: Student | Admitting: Student

## 2022-10-16 DIAGNOSIS — I639 Cerebral infarction, unspecified: Secondary | ICD-10-CM

## 2022-10-16 DIAGNOSIS — I442 Atrioventricular block, complete: Secondary | ICD-10-CM

## 2022-10-16 DIAGNOSIS — I4891 Unspecified atrial fibrillation: Secondary | ICD-10-CM

## 2022-10-16 LAB — CBC
HCT: 42.7 % (ref 39.0–52.0)
Hemoglobin: 13.7 g/dL (ref 13.0–17.0)
MCH: 26.7 pg (ref 26.0–34.0)
MCHC: 32.1 g/dL (ref 30.0–36.0)
MCV: 83.2 fL (ref 80.0–100.0)
Platelets: 256 10*3/uL (ref 150–400)
RBC: 5.13 MIL/uL (ref 4.22–5.81)
RDW: 15.3 % (ref 11.5–15.5)
WBC: 11.1 10*3/uL — ABNORMAL HIGH (ref 4.0–10.5)
nRBC: 0 % (ref 0.0–0.2)

## 2022-10-16 LAB — BASIC METABOLIC PANEL
Anion gap: 14 (ref 5–15)
BUN: 12 mg/dL (ref 8–23)
CO2: 20 mmol/L — ABNORMAL LOW (ref 22–32)
Calcium: 9.8 mg/dL (ref 8.9–10.3)
Chloride: 104 mmol/L (ref 98–111)
Creatinine, Ser: 1.04 mg/dL (ref 0.61–1.24)
GFR, Estimated: >60 mL/min (ref >60)
Glucose, Bld: 202 mg/dL — ABNORMAL HIGH (ref 70–99)
Potassium: 4.2 mmol/L (ref 3.5–5.1)
Sodium: 138 mmol/L (ref 135–145)

## 2022-10-16 LAB — CBG MONITORING, ED: Glucose-Capillary: 286 mg/dL — ABNORMAL HIGH (ref 70–99)

## 2022-10-16 LAB — GLUCOSE, CAPILLARY
Glucose-Capillary: 320 mg/dL — ABNORMAL HIGH (ref 70–99)
Glucose-Capillary: 229 mg/dL — ABNORMAL HIGH (ref 70–99)

## 2022-10-16 LAB — MAGNESIUM: Magnesium: 1.5 mg/dL — ABNORMAL LOW (ref 1.7–2.4)

## 2022-10-16 MED ORDER — MAGNESIUM OXIDE -MG SUPPLEMENT 400 (240 MG) MG PO TABS: 400.0000 mg | ORAL_TABLET | Freq: Two times a day (BID) | ORAL | Status: DC

## 2022-10-16 MED ORDER — ATORVASTATIN CALCIUM 40 MG PO TABS: 40.0000 mg | ORAL_TABLET | Freq: Every day | ORAL | 2 refills | Status: DC

## 2022-10-16 MED ORDER — ASPIRIN 81 MG PO CHEW: 81.0000 mg | CHEWABLE_TABLET | Freq: Every day | ORAL | 0 refills | Status: AC

## 2022-10-16 MED ORDER — INSULIN GLARGINE-YFGN 100 UNIT/ML ~~LOC~~ SOLN
15.0000 [IU] | Freq: Every day | SUBCUTANEOUS | Status: DC
  Administered 2022-10-16: 15 [IU] via SUBCUTANEOUS
  Filled 2022-10-16: qty 0.15

## 2022-10-16 MED ORDER — CLOPIDOGREL BISULFATE 75 MG PO TABS: 75.0000 mg | ORAL_TABLET | Freq: Every day | ORAL | 2 refills | Status: DC

## 2022-10-16 MED ORDER — SUCRALFATE 1 GM/10ML PO SUSP: 1.0000 g | Freq: Three times a day (TID) | ORAL | 0 refills | Status: DC

## 2022-10-16 MED ORDER — MAGNESIUM OXIDE -MG SUPPLEMENT 400 (240 MG) MG PO TABS: 400.0000 mg | ORAL_TABLET | Freq: Two times a day (BID) | ORAL | 11 refills | Status: DC

## 2022-10-16 MED ORDER — MAGNESIUM SULFATE 4 GM/100ML IV SOLN
4.0000 g | Freq: Once | INTRAVENOUS | Status: AC
  Administered 2022-10-16: 4 g via INTRAVENOUS
  Filled 2022-10-16: qty 100

## 2022-10-16 MED ORDER — PANTOPRAZOLE SODIUM 40 MG PO TBEC: 40.0000 mg | DELAYED_RELEASE_TABLET | Freq: Two times a day (BID) | ORAL | 1 refills | Status: DC

## 2022-10-16 NOTE — Care Management (Signed)
ED RNCM received call from [facility] concerning patient needing transportation home. Contacted patient's daughter to confirm address, she states, that her son is here in [place] and will come by soon to pick the patient up.  Updated  Rolla Plate RN caring for patient.

## 2022-10-16 NOTE — Progress Notes (Signed)
Inpatient Diabetes Program Recommendations  AACE/ADA: New Consensus Statement on Inpatient Glycemic Control (2015)  Target Ranges:  Prepandial:   less than 140 mg/dL      Peak postprandial:   less than 180 mg/dL (1-2 hours)      Critically ill patients:  140 - 180 mg/dL   Lab Results  Component Value Date   GLUCAP 286 (H) 10/16/2022   HGBA1C 8.4 (H) 10/15/2022    Review of Glycemic Control  Latest Reference Range & Units 10/15/22 11:44 10/15/22 12:47 10/15/22 16:58 10/15/22 21:48 10/16/22 08:07  Glucose-Capillary 70 - 99 mg/dL 149 (H) 123 (H) 304 (H) 221 (H) 286 (H)   Diabetes history: DM 2 Outpatient Diabetes medications:  70/30 20 units bid Current orders for Inpatient glycemic control:  Novolog 0-9 units tid with meals and HS  Inpatient Diabetes Program Recommendations:    Note patient was taking pre-mixed insulin prior to admit.  Consider adding Semglee 20 units daily and Novolog 3 units tid with meals (hold if patient eats less than 50% or NPO).   Thanks,  Adah Perl, RN, BC-ADM Inpatient Diabetes Coordinator Pager [phone]  (8a-5p)

## 2022-10-16 NOTE — Discharge Summary (Signed)
[facility]  Physician Discharge Summary   Patient ID: Andre Stout MRN: [id number] DOB/AGE: [birth year] 82 y.o.  Admit date: 10/15/2022 Discharge date:   10/16/2022   PCP: [name], FNP  DISCHARGE DIAGNOSES:  Acute stroke Diabetes mellitus type 2, Uncontrolled with hyperglycemia Hyperlipidemia   RECOMMENDATIONS FOR OUTPATIENT FOLLOW UP: Radiology to arrange outpatient follow-up for complete heart block Ambulatory referral sent to neurology Patient to follow-up with general surgery and interventional radiology regarding cholecystitis and cholecystostomy drain    Home Health: PT OT Equipment/Devices: None  CODE STATUS: Full code  DISCHARGE CONDITION: fair  Diet recommendation: Modified carbohydrate  INITIAL HISTORY: 82 year old African-American male with history of complete heart block, essential hypertension, diabetes mellitus type 2, prostate cancer recently admitted for episodes of syncope and was found to have complete heart block.  Followed by cardiology.  Presented with right-sided weakness with dysarthria.  Initial MRI brain did not show acute stroke.  TIA was suspected.    Consultants: Neurology   Procedures: Echocardiogram.  Lower extremity Doppler study   HOSPITAL COURSE:   Acute stroke Initial MRI did not show any acute stroke.  Seen by neurology.  Subsequent MRI did show frontal infarcts. LDL is 38.  HbA1c 8.4. Carotid Dopplers without any significant stenosis. Echocardiogram shows normal systolic function. Seen by PT and OT and home health is recommended. Aspirin and Plavix for 3 weeks followed by Plavix alone Plan is for the patient to undergo lower extremity Doppler study to rule out DVT.  If negative patient should be able to go home.  Cardiology to apply heart monitor prior to discharge.   Acute kidney injury Came in with creatinine of 1.45.  Baseline renal function is normal.  Gently hydrated.  Noted to be better this  morning.     Diabetes mellitus type 2, uncontrolled with hyperglycemia HbA1c 8.4.  Resume home medications   Essential hypertension Permissive hypertension in the hospital.  May resume home medications at discharge.   Hyperlipidemia LDL is 38.  Noted to be on atorvastatin.   Recent cholecystitis status post percutaneous cholecystostomy Was seen by general surgery during his previous hospitalization in August.  Underwent drainage procedure by interventional radiology.  Still has a drain bag.  General surgery had planned interval cholecystectomy in a few weeks.  Neurology has reached out to general surgery.  Interventional radiology has seen the patient.  Drain has been capped.  Defer further management to general surgery and interventional radiology   Hypomagnesemia  continue mag medium oxide  Complete heart block Noted on EKG.  Previous history of same.  Seen by cardiologist Dr. Lovena Le during previous admission in August.  Was not thought to require pacemaker at that time.  Heart rate is greater than 60.  He is asymptomatic.   Cardiology to arrange outpatient follow-up   Obesity Estimated body mass index is 31.33 kg/m as calculated from the following:   Height as of this encounter: 5' 5" (1.651 m).   Weight as of this encounter: 85.4 kg.   Okay for discharge home if lower extremity Doppler studies negative for DVT.  Discussed with patient and his daughter.  PERTINENT LABS:  The results of significant diagnostics from this hospitalization (including imaging, microbiology, ancillary and laboratory) are listed below for reference.     Labs:   Basic Metabolic Panel: Recent Labs  Lab 10/15/22 0026 10/15/22 0031 10/16/22 0546  NA 134* 136 138  K 4.1 4.3 4.2  CL 103 105 104  CO2 19*  --  20*  GLUCOSE 171* 168* 202*  BUN _0 CREATININE 1.45* 1.50* 1.04  CALCIUM 9.5  --  9.8  MG 1.8  --  1.5*   Liver Function Tests: Recent Labs  Lab 10/15/22 0026  AST 13*  ALT  10  ALKPHOS 79  BILITOT 1.0  PROT 6.7  ALBUMIN 3.4*    CBC: Recent Labs  Lab 10/15/22 0026 10/15/22 0031 10/16/22 0546  WBC 9.4  --  11.1*  NEUTROABS 4.0  --   --   HGB 13.1 13.6 13.7  HCT 41.1 40.0 42.7  MCV 84.6  --  83.2  PLT 228  --  256     CBG: Recent Labs  Lab 10/15/22 1247 10/15/22 1658 10/15/22 2148 10/16/22 0807 10/16/22 1231  GLUCAP 123* 304* 221* 286* 320*     IMAGING STUDIES MR BRAIN WO CONTRAST  Result Date: 10/16/2022 CLINICAL DATA:  Stroke follow-up. EXAM: MRI HEAD WITHOUT CONTRAST TECHNIQUE: Multiplanar, multiecho pulse sequences of the brain and surrounding structures were obtained without intravenous contrast. COMPARISON:  Brain MRI from yesterday FINDINGS: Brain: Small acute cortical infarcts have occurred in the left frontal lobe adjacent to the sylvian fissure. Possible small acute infarcts in the left frontal white matter on coronal diffusion acquisition. Chronic small vessel ischemia in the cerebral white matter. 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 Generalized brain atrophy. Vascular: Major flow voids are preserved Skull and upper cervical spine: Normal marrow signal. Sinuses/Orbits: Bilateral cataract resection.  No acute finding IMPRESSION: 1. Small acute infarcts in the left frontal lobe. 2. Chronic small vessel ischemia and generalized brain atrophy. 3. 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 Electronically Signed   By: Jorje Guild M.D.   On: 10/16/2022 08:04   ECHOCARDIOGRAM COMPLETE BUBBLE STUDY  Result Date: 10/15/2022    ECHOCARDIOGRAM REPORT   Patient Name:   Andre Stout Date of Exam: 10/15/2022 Medical Rec #:  [id number]      Height:       65.0 in Accession #:    [id number]     Weight:       188.3 lb Date of Birth:  14-Apr-1940     BSA:          1.928 m Patient Age:    57 years       BP:           137/65 mmHg Patient Gender: M              HR:           64 bpm. Exam Location:  Inpatient Procedure: 2D Echo, Color  Doppler, Cardiac Doppler and Intracardiac            Opacification Agent Indications:    TIA (transient ischemic attack) 435.9 / G45.9  History:        Patient has prior history of Echocardiogram examinations, most                 recent 08/16/2022. CAD, TIA, Signs/Symptoms:Syncope; Risk                 Factors:Hypertension and Diabetes.  Sonographer:    Ronny Flurry Referring Phys: [id number] [place]  1. Left ventricular ejection fraction, by estimation, is 60 to 65%. The left ventricle has normal function. The left ventricle has no regional wall motion abnormalities. There is moderate hypertrophy of the basal septum. The rest of the LV segments demonstrate mild left ventricular hypertrophy.  Left ventricular diastolic parameters are consistent with Grade I diastolic dysfunction (impaired relaxation).  2. Right ventricular systolic function is normal. The right ventricular size is normal.  3. The mitral valve is grossly normal. Trivial mitral valve regurgitation. No evidence of mitral stenosis.  4. The aortic valve is tricuspid. There is mild calcification of the aortic valve. There is mild thickening of the aortic valve. Aortic valve regurgitation is not visualized. Aortic valve sclerosis/calcification is present, without any evidence of aortic stenosis.  5. The inferior vena cava is normal in size with greater than 50% respiratory variability, suggesting right atrial pressure of 3 mmHg.  6. Agitated saline contrast bubble study was negative, with no evidence of any interatrial shunt. Comparison(s): No significant change from prior study. Conclusion(s)/Recommendation(s): No intracardiac source of embolism detected on this transthoracic study. Consider a transesophageal echocardiogram to exclude cardiac source of embolism if clinically indicated. FINDINGS  Left Ventricle: Left ventricular ejection fraction, by estimation, is 60 to 65%. The left ventricle has normal function. The left ventricle has  no regional wall motion abnormalities. Definity contrast agent was given IV to delineate the left ventricular  endocardial borders. The left ventricular internal cavity size was normal in size. There is moderate hypertrophy of the basal septum. The rest of the LV segments demonstrate mild left ventricular hypertrophy. Left ventricular diastolic parameters are consistent with Grade I diastolic dysfunction (impaired relaxation). Right Ventricle: The right ventricular size is normal. No increase in right ventricular wall thickness. Right ventricular systolic function is normal. Left Atrium: Left atrial size was normal in size. Right Atrium: Right atrial size was normal in size. Pericardium: There is no evidence of pericardial effusion. Mitral Valve: The mitral valve is grossly normal. Mild to moderate mitral annular calcification. Trivial mitral valve regurgitation. No evidence of mitral valve stenosis. Tricuspid Valve: The tricuspid valve is normal in structure. Tricuspid valve regurgitation is trivial. Aortic Valve: The aortic valve is tricuspid. There is mild calcification of the aortic valve. There is mild thickening of the aortic valve. Aortic valve regurgitation is not visualized. Aortic valve sclerosis/calcification is present, without any evidence of aortic stenosis. Aortic valve mean gradient measures 2.5 mmHg. Aortic valve peak gradient measures 4.6 mmHg. Aortic valve area, by VTI measures 2.12 cm. Pulmonic Valve: The pulmonic valve was normal in structure. Pulmonic valve regurgitation is trivial. Aorta: The aortic root and ascending aorta are structurally normal, with no evidence of dilitation. Venous: The inferior vena cava is normal in size with greater than 50% respiratory variability, suggesting right atrial pressure of 3 mmHg. IAS/Shunts: The atrial septum is grossly normal. Agitated saline contrast bubble study was negative, with no evidence of any interatrial shunt.  LEFT VENTRICLE PLAX 2D LVIDd:          3.80 cm   Diastology LVIDs:         2.90 cm   LV e' medial:   5.11 cm/s LV PW:         1.40 cm   LV E/e' medial: 15.5 LV IVS:        1.40 cm LVOT diam:     1.90 cm LV SV:         49 LV SV Index:   25 LVOT Area:     2.84 cm  RIGHT VENTRICLE RV S prime:     13.50 cm/s TAPSE (M-mode): 1.4 cm LEFT ATRIUM             Index        RIGHT  ATRIUM           Index LA diam:        3.40 cm 1.76 cm/m   RA Area:     12.70 cm LA Vol (A2C):   49.9 ml 25.88 ml/m  RA Volume:   23.30 ml  12.09 ml/m LA Vol (A4C):   32.6 ml 16.91 ml/m LA Biplane Vol: 40.9 ml 21.21 ml/m  AORTIC VALVE AV Area (Vmax):    2.13 cm AV Area (Vmean):   1.99 cm AV Area (VTI):     2.12 cm AV Vmax:           107.00 cm/s AV Vmean:          76.000 cm/s AV VTI:            0.229 m AV Peak Grad:      4.6 mmHg AV Mean Grad:      2.5 mmHg LVOT Vmax:         80.40 cm/s LVOT Vmean:        53.367 cm/s LVOT VTI:          0.171 m LVOT/AV VTI ratio: 0.75  AORTA Ao Root diam: 3.10 cm Ao Asc diam:  3.10 cm MITRAL VALVE MV Area (PHT): 3.03 cm    SHUNTS MV Decel Time: 250 msec    Systemic VTI:  0.17 m MV E velocity: 79.20 cm/s  Systemic Diam: 1.90 cm MV A velocity: 89.30 cm/s MV E/A ratio:  0.89 Gwyndolyn Kaufman MD Electronically signed by Gwyndolyn Kaufman MD Signature Date/Time: 10/15/2022/7:05:44 PM    Final    VAS US CAROTID  Result Date: 10/15/2022 Carotid Arterial Duplex Study Patient Name:  Andre Stout  Date of Exam:   10/15/2022 Medical Rec #: [id number]       Accession #:    [id number] Date of Birth: 08/01/1940      Patient Gender: M Patient Age:   27 years Exam Location:  [facility] Procedure:      VAS US CAROTID Referring Phys: Irene Pap --------------------------------------------------------------------------------  Indications:       TIA. Risk Factors:      Hypertension, Diabetes. Comparison Study:  no prior Performing Technologist: Archie Patten RVS  Examination Guidelines: A complete evaluation includes B-mode imaging, spectral  Doppler, color Doppler, and power Doppler as needed of all accessible portions of each vessel. Bilateral testing is considered an integral part of a complete examination. Limited examinations for reoccurring indications may be performed as noted.  Right Carotid Findings: +----------+--------+--------+--------+------------------+--------+           PSV cm/sEDV cm/sStenosisPlaque DescriptionComments +----------+--------+--------+--------+------------------+--------+ CCA Prox  112     10              heterogenous               +----------+--------+--------+--------+------------------+--------+ CCA Distal71      8               heterogenous               +----------+--------+--------+--------+------------------+--------+ ICA Prox  67      14      1-39%   heterogenous               +----------+--------+--------+--------+------------------+--------+ ICA Distal55      11                                         +----------+--------+--------+--------+------------------+--------+  ECA       128                                                +----------+--------+--------+--------+------------------+--------+ +----------+--------+-------+--------+-------------------+           PSV cm/sEDV cmsDescribeArm Pressure (mmHG) +----------+--------+-------+--------+-------------------+ Subclavian170                                        +----------+--------+-------+--------+-------------------+ +---------+--------+--+--------+-+---------+ VertebralPSV cm/s45EDV cm/s8Antegrade +---------+--------+--+--------+-+---------+  Left Carotid Findings: +----------+--------+--------+--------+------------------+--------+           PSV cm/sEDV cm/sStenosisPlaque DescriptionComments +----------+--------+--------+--------+------------------+--------+ CCA Prox  127     18              heterogenous               +----------+--------+--------+--------+------------------+--------+  CCA Distal104     13              heterogenous               +----------+--------+--------+--------+------------------+--------+ ICA Prox  65      15      1-39%   heterogenous               +----------+--------+--------+--------+------------------+--------+ ICA Distal55      12                                         +----------+--------+--------+--------+------------------+--------+ ECA       108                                                +----------+--------+--------+--------+------------------+--------+ +----------+--------+--------+--------+-------------------+           PSV cm/sEDV cm/sDescribeArm Pressure (mmHG) +----------+--------+--------+--------+-------------------+ [id number]                                         +----------+--------+--------+--------+-------------------+ +---------+--------+--+--------+--+---------+ VertebralPSV cm/s45EDV cm/s10Antegrade +---------+--------+--+--------+--+---------+   Summary: Right Carotid: Velocities in the right ICA are consistent with a 1-39% stenosis. Left Carotid: Velocities in the left ICA are consistent with a 1-39% stenosis. Vertebrals: Bilateral vertebral arteries demonstrate antegrade flow. *See table(s) above for measurements and observations.  Electronically signed by Antony Contras MD on 10/15/2022 at 10:41:29 AM.    Final    MR ANGIO HEAD WO CONTRAST  Result Date: 10/15/2022 CLINICAL DATA:  82 year old male with right side weakness. MRI head earlier today negative for acute infarct. EXAM: MRA HEAD WITHOUT CONTRAST TECHNIQUE: Angiographic images of the Circle of Willis were acquired using MRA technique without intravenous contrast. COMPARISON:  Brain MRI 0058 hours today. FINDINGS: Anterior circulation: Antegrade flow in both ICA siphons. Mild siphon irregularity, no convincing siphon stenosis. Patent carotid termini. MCA and ACA origins appear normal. Anterior communicating artery is within normal  limits. The left ACA is mildly dominant throughout. ACA branches are within normal limits. MCA M1 segments and MCA bi/trifurcations are patent without stenosis. Visible bilateral MCA branches are within normal limits. Posterior circulation: Antegrade  flow in the posterior circulation with patent codominant distal vertebral arteries and vertebrobasilar junction without evidence of stenosis. Patent PICA origins. Patent basilar artery with mild to moderate irregularity and stenosis at the distal 3rd of the basilar, series 1055, image 11). Patent basilar tip, SCA and PCA origins. Visible PCA branches are within normal limits. Anatomic variants: None significant. Other: Right cerebellopontine angle mass, see comparison MRI. 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 Electronically Signed   By: Genevie Ann M.D.   On: 10/15/2022 08:34   MR BRAIN WO CONTRAST  Result Date: 10/15/2022 CLINICAL DATA:  Right-sided weakness EXAM: MRI HEAD WITHOUT CONTRAST TECHNIQUE: Multiplanar, multiecho pulse sequences of the brain and surrounding structures were obtained without intravenous contrast. COMPARISON:  08/01/2022 FINDINGS: Brain: No acute infarct, mass effect or extra-axial collection. No acute or chronic hemorrhage. Faint areas of hyperintensity on diffusion-weighted imaging in the left frontal white matter likely are due to T2 shine through effects. There is multifocal periventricular white matter hyperintensity, most often a result of chronic microvascular ischemia. There is generalized atrophy without lobar predilection. The midline structures are normal. 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 Vascular: Major flow voids are preserved. Skull and upper cervical spine: Normal calvarium and skull base. Visualized upper cervical spine and soft tissues are  normal. Sinuses/Orbits:No paranasal sinus fluid levels or advanced mucosal thickening. No mastoid or middle ear effusion. Normal orbits. IMPRESSION: 1. No acute intracranial abnormality. 2. Generalized atrophy and findings of chronic small vessel ischemia. 3. Unchanged right CP angle meningioma. Electronically Signed   By: Ulyses Jarred M.D.   On: 10/15/2022 01:26   CT HEAD CODE STROKE WO CONTRAST  Result Date: 10/15/2022 CLINICAL DATA:  Code stroke.  Right-sided weakness EXAM: CT HEAD WITHOUT CONTRAST TECHNIQUE: Contiguous axial images were obtained from the base of the skull through the vertex without intravenous contrast. RADIATION DOSE REDUCTION: This exam was performed according to the departmental dose-optimization program which includes automated exposure control, adjustment of the mA and/or kV according to patient size and/or use of iterative reconstruction technique. COMPARISON:  None Available. FINDINGS: Brain: There is no mass, hemorrhage or extra-axial collection. There is generalized atrophy without lobar predilection. There is hypoattenuation of the periventricular white matter, most commonly indicating chronic ischemic microangiopathy. Vascular: No abnormal hyperdensity of the major intracranial arteries or dural venous sinuses. No intracranial atherosclerosis. Skull: The visualized skull base, calvarium and extracranial soft tissues are normal. Sinuses/Orbits: No fluid levels or advanced mucosal thickening of the visualized paranasal sinuses. No mastoid or middle ear effusion. The orbits are normal. ASPECTS [facility] Stroke Program Early CT Score) - Ganglionic level infarction (caudate, lentiform nuclei, internal capsule, insula, M1-M3 cortex): 7 - Supraganglionic infarction (M4-M6 cortex): 3 Total score (0-10 with 10 being normal): 10 IMPRESSION: 1. No acute intracranial abnormality. 2. ASPECTS is 10 These results were communicated to Dr. Donnetta Simpers at 12:38 am on 10/15/2022 by text page  via the [facility] messaging system. Electronically Signed   By: Ulyses Jarred M.D.   On: 10/15/2022 00:38   DG Sinus/Fist Tube Chk-Non GI  Result Date: 10/10/2022 CLINICAL DATA:  82 year old male with history of acute calculus cholecystitis status post percutaneous cholecystostomy tube placement on 08/17/2022. EXAM: ABSCESS INJECTION COMPARISON:  08/14/2022, 08/17/2022 CONTRAST:  20 mL Omnipaque 300-administered via the existing percutaneous drain. FLUOROSCOPY TIME:  24 seconds, 12 mGy TECHNIQUE:  The patient was positioned supine on the fluoroscopy table. A preprocedural spot fluoroscopic image was obtained of the right upper quadrant and the existing cholecystostomy catheter. Multiple spot fluoroscopic and radiographic images were obtained following the injection of a small amount of contrast via the existing percutaneous drainage catheter. FINDINGS: Minimal filling defects in the gallbladder fundus compatible with small gallstones. Brisk antegrade flow through the cystic duct and into the common bile duct. Relatively low insertion of the cystic duct into the hepatic bile duct. Brisk antegrade flow through the patent ampulla into the duodenum. IMPRESSION: 1. Patent cystic duct with relatively low insertion into the hepatic duct. No evidence of choledocholithiasis. 2. Suggestion of few gallstones in the gallbladder fundus. Ruthann Cancer, MD [facility] Electronically Signed   By: Ruthann Cancer M.D.   On: 10/10/2022 12:04   IR Radiologist Eval & Mgmt  Result Date: 10/10/2022 Please refer to notes tab for details about interventional procedure. (Op Note)  IR Radiologist Eval & Mgmt  Result Date: 09/28/2022 Please refer to notes tab for details about interventional procedure. (Op Note)   DISCHARGE EXAMINATION: See progress note from earlier today  DISPOSITION: Home  Discharge Instructions     Ambulatory referral to Neurology   Complete by: As  directed    An appointment is requested in approximately: 8 weeks   Call MD for:  difficulty breathing, headache or visual disturbances   Complete by: As directed    Call MD for:  extreme fatigue   Complete by: As directed    Call MD for:  persistant dizziness or light-headedness   Complete by: As directed    Call MD for:  persistant nausea and vomiting   Complete by: As directed    Call MD for:  severe uncontrolled pain   Complete by: As directed    Call MD for:  temperature >100.4   Complete by: As directed    Diet - low sodium heart healthy   Complete by: As directed    Discharge instructions   Complete by: As directed    Please take your medications as prescribed.  Please be sure to follow-up with general surgery and the radiology team for the drain removal.  You were cared for by a hospitalist during your hospital stay. If you have any questions about your discharge medications or the care you received while you were in the hospital after you are discharged, you can call the unit and asked to speak with the hospitalist on call if the hospitalist that took care of you is not available. Once you are discharged, your primary care physician will handle any further medical issues. Please note that NO REFILLS for any discharge medications will be authorized once you are discharged, as it is imperative that you return to your primary care physician (or establish a relationship with a primary care physician if you do not have one) for your aftercare needs so that they can reassess your need for medications and monitor your lab values. If you do not have a primary care physician, you can call [phone] for a physician referral.   Increase activity slowly   Complete by: As directed          Allergies as of 10/16/2022   No Known Allergies      Medication List     STOP taking these medications    cetirizine 10 MG tablet Commonly known as: ZYRTEC       TAKE these medications  Accu-Chek Guide w/Device Kit Use As Directed   acetaminophen 500 MG tablet Commonly known as: TYLENOL Take 1,000 mg by mouth daily as needed (pain).   amLODipine 10 MG tablet Commonly known as: NORVASC Take 1 tablet (10 mg total) by mouth daily.   aspirin 81 MG chewable tablet Chew 1 tablet (81 mg total) by mouth daily for 21 days. For 3 weeks only What changed: additional instructions   atorvastatin 40 MG tablet Commonly known as: LIPITOR Take 1 tablet (40 mg total) by mouth daily. Start taking on: October 17, 2022   BD Insulin Syringe U/F 31G X 5/16" 1 ML Misc Generic drug: Insulin Syringe-Needle U-100 USE 2 DAILY   BD PosiFlush 0.9 % Soln injection Generic drug: sodium chloride flush Inject 5 mLs into the vein as needed for up to 30 doses. Discard remainder of syringe. What changed: when to take this   brimonidine 0.2 % ophthalmic solution Commonly known as: ALPHAGAN Place 1 drop into both eyes 2 (two) times daily with breakfast and lunch.   clopidogrel 75 MG tablet Commonly known as: PLAVIX Take 1 tablet (75 mg total) by mouth daily. Start taking on: October 17, 2022   dorzolamide-timolol 2-0.5 % ophthalmic solution Commonly known as: COSOPT Place 1 drop into both eyes 2 (two) times daily with breakfast and lunch.   insulin NPH-regular Human (70-30) 100 UNIT/ML injection Inject 20 Units into the skin 2 (two) times daily with a meal.   latanoprost 0.005 % ophthalmic solution Commonly known as: XALATAN Place 1 drop into both eyes at bedtime.   lisinopril 20 MG tablet Commonly known as: ZESTRIL Take 1 tablet (20 mg total) by mouth daily.   magnesium oxide 400 (240 Mg) MG tablet Commonly known as: MAG-OX Take 1 tablet (400 mg total) by mouth 2 (two) times daily. What changed: when to take this   OneTouch Delica Lancets 39J Misc Use to check blood sugar 4 times per day. Dx code: E11.9   OneTouch Ultra test strip Generic drug: glucose blood USE TO  MONITOR GLUCOSE LEVELS 4 TIMES PER DAY E11.9   pantoprazole 40 MG tablet Commonly known as: PROTONIX Take 1 tablet (40 mg total) by mouth 2 (two) times daily before a meal.   PreserVision AREDS 2 Caps Take 1 capsule by mouth at bedtime.   sucralfate 1 GM/10ML suspension Commonly known as: CARAFATE Take 10 mLs (1 g total) by mouth 4 (four) times daily -  with meals and at bedtime.               Durable Medical Equipment  (From admission, onward)           Start     Ordered   10/16/22 1101  For home use only DME Bedside commode  Once       Question:  Patient needs a bedside commode to treat with the following condition  Answer:  TIA (transient ischemic attack)   10/16/22 1100              Follow-up [place], Laura Woodruff, [place]. Schedule an appointment as soon as possible for a visit in 1 week(s).   Specialty: Internal Medicine Why: post hospitalization follow up Contact information: [place] [address] [phone]                 TOTAL DISCHARGE TIME: 4 minutes  Bonnielee Haff  [facility] Pager on [email or web address]  10/16/2022, 2:09 PM

## 2022-10-16 NOTE — Progress Notes (Signed)
PT Cancellation Note  Patient Details Name: Andre Stout MRN: [id number] DOB: January 29, 1940   Cancelled Treatment:    Reason Eval/Treat Not Completed: Patient at procedure or test/unavailable Pt going to vascular. Will follow up as schedule allows.   [name], DPT  Acute Rehabilitation Services  Office: [phone]    Rudean Hitt 10/16/2022, 3:18 PM

## 2022-10-16 NOTE — TOC Transition Note (Signed)
Transition of Care [facility]) - CM/SW Discharge Note   Patient Details  Name: Andre Stout MRN: [id number] Date of Birth: 03-27-1940  [facility] CM/SW Contact:  Pollie Friar, RN Phone Number: 10/16/2022, 11:03 AM   Clinical Narrative:    Pt is from home with his grandson and grandson's wife. Pt states that they work but his daughter is usually present at the home when they are gone.  DME at home: walker/ cane [facility] for home ordered through [facility] and will be delivered to the room.  Pt is active with [facility] for home health services. CM has updated Malachy Mood with [facility] on admission and resumption orders.  Pts daughter or grandson provide needed transportation and will provide transport home at d/c.  Pt's daughter manages his medications and he denies any issues.    Final next level of care: Home w Home Health Services Barriers to Discharge: No Barriers Identified   Patient Goals and CMS Choice   CMS Medicare.gov Compare Post Acute Care list provided to:: Patient Choice offered to / list presented to : Patient  Discharge Placement                       Discharge Plan and Services                DME Arranged: Bedside commode DME Agency: [employer] Date DME Agency Contacted: 10/16/22   Representative spoke with at DME Agency: Annie Sable HH Arranged: PT, OT [place] Agency: [place] Date [place]: 10/16/22   Representative spoke with at [place]: [place] ([place]) Interventions     Readmission Risk Interventions    08/15/2022    9:25 AM  Readmission Risk Prevention Plan  Transportation Screening Complete  PCP or Specialist Appt within 5-7 Days Complete  Home Care Screening Complete  Medication Review (RN CM) Complete

## 2022-10-16 NOTE — ED Notes (Addendum)
Pt back from MRI and resting comfortably.

## 2022-10-16 NOTE — Progress Notes (Addendum)
STROKE TEAM PROGRESS NOTE   INTERVAL HISTORY Patient reports improving symptoms of hand weakness compared to yesterday. Has no issues this morning. He is agreeable to a heart monitor and followup with cardiology outpatient regarding pacemaker.  Vitals:   10/16/22 0700 10/16/22 0854 10/16/22 0935 10/16/22 1228  BP:  128/67 (!) 177/65 (!) 126/57  Pulse: 76 71 76 96  Resp: '17 16 16 15  '$ Temp:  97.7 F (36.5 C) 97.8 F (36.6 C) 98 F (36.7 C)  TempSrc:  Oral Oral Oral  SpO2: 98% 98% 97% 99%  Weight:      Height:       CBC:  Recent Labs  Lab 10/15/22 0026 10/15/22 0031 10/16/22 0546  WBC 9.4  --  11.1*  NEUTROABS 4.0  --   --   HGB 13.1 13.6 13.7  HCT 41.1 40.0 42.7  MCV 84.6  --  83.2  PLT 228  --  027   Basic Metabolic Panel:  Recent Labs  Lab 10/15/22 0026 10/15/22 0031 10/16/22 0546  NA 134* 136 138  K 4.1 4.3 4.2  CL 103 105 104  CO2 19*  --  20*  GLUCOSE 171* 168* 202*  BUN '20 22 12  '$ CREATININE 1.45* 1.50* 1.04  CALCIUM 9.5  --  9.8  MG 1.8  --  1.5*   Lipid Panel:  Recent Labs  Lab 10/15/22 1233  CHOL 111  TRIG 134  HDL 46  CHOLHDL 2.4  VLDL 27  LDLCALC 38   HgbA1c:  Recent Labs  Lab 10/15/22 1233  HGBA1C 8.4*   Urine Drug Screen: No results for input(s): "LABOPIA", "COCAINSCRNUR", "LABBENZ", "AMPHETMU", "THCU", "LABBARB" in the last 168 hours.  Alcohol Level  Recent Labs  Lab 10/15/22 0026  ETH <10    IMAGING past 24 hours MR BRAIN WO CONTRAST  Result Date: 10/16/2022 CLINICAL DATA:  Stroke follow-up. EXAM: MRI HEAD WITHOUT CONTRAST TECHNIQUE: Multiplanar, multiecho pulse sequences of the brain and surrounding structures were obtained without intravenous contrast. COMPARISON:  Brain MRI from yesterday FINDINGS: Brain: Small acute cortical infarcts have occurred in the left frontal lobe adjacent to the sylvian fissure. Possible small acute infarcts in the left frontal white matter on coronal diffusion acquisition. Chronic small vessel  ischemia in the cerebral white matter. 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 Generalized brain atrophy. Vascular: Major flow voids are preserved Skull and upper cervical spine: Normal marrow signal. Sinuses/Orbits: Bilateral cataract resection.  No acute finding IMPRESSION: 1. Small acute infarcts in the left frontal lobe. 2. Chronic small vessel ischemia and generalized brain atrophy. 3. 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 Electronically Signed   By: Jorje Guild M.D.   On: 10/16/2022 08:04   ECHOCARDIOGRAM COMPLETE BUBBLE STUDY  Result Date: 10/15/2022    ECHOCARDIOGRAM REPORT   Patient Name:   Andre Stout Date of Exam: 10/15/2022 Medical Rec #:  [id number]      Height:       65.0 in Accession #:    [id number]     Weight:       188.3 lb Date of Birth:  [birth year]     BSA:          1.928 m Patient Age:    82 years       BP:           137/65 mmHg Patient Gender: M              HR:  64 bpm. Exam Location:  Inpatient Procedure: 2D Echo, Color Doppler, Cardiac Doppler and Intracardiac            Opacification Agent Indications:    TIA (transient ischemic attack) 435.9 / G45.9  History:        Patient has prior history of Echocardiogram examinations, most                 recent 08/16/2022. CAD, TIA, Signs/Symptoms:Syncope; Risk                 Factors:Hypertension and Diabetes.  Sonographer:    Ronny Flurry Referring Phys: [id number] [place]  1. Left ventricular ejection fraction, by estimation, is 60 to 65%. The left ventricle has normal function. The left ventricle has no regional wall motion abnormalities. There is moderate hypertrophy of the basal septum. The rest of the LV segments demonstrate mild left ventricular hypertrophy. Left ventricular diastolic parameters are consistent with Grade I diastolic dysfunction (impaired relaxation).  2. Right ventricular systolic function is normal. The right ventricular size is normal.  3. The mitral valve is  grossly normal. Trivial mitral valve regurgitation. No evidence of mitral stenosis.  4. The aortic valve is tricuspid. There is mild calcification of the aortic valve. There is mild thickening of the aortic valve. Aortic valve regurgitation is not visualized. Aortic valve sclerosis/calcification is present, without any evidence of aortic stenosis.  5. The inferior vena cava is normal in size with greater than 50% respiratory variability, suggesting right atrial pressure of 3 mmHg.  6. Agitated saline contrast bubble study was negative, with no evidence of any interatrial shunt. Comparison(s): No significant change from prior study. Conclusion(s)/Recommendation(s): No intracardiac source of embolism detected on this transthoracic study. Consider a transesophageal echocardiogram to exclude cardiac source of embolism if clinically indicated. FINDINGS  Left Ventricle: Left ventricular ejection fraction, by estimation, is 60 to 65%. The left ventricle has normal function. The left ventricle has no regional wall motion abnormalities. Definity contrast agent was given IV to delineate the left ventricular  endocardial borders. The left ventricular internal cavity size was normal in size. There is moderate hypertrophy of the basal septum. The rest of the LV segments demonstrate mild left ventricular hypertrophy. Left ventricular diastolic parameters are consistent with Grade I diastolic dysfunction (impaired relaxation). Right Ventricle: The right ventricular size is normal. No increase in right ventricular wall thickness. Right ventricular systolic function is normal. Left Atrium: Left atrial size was normal in size. Right Atrium: Right atrial size was normal in size. Pericardium: There is no evidence of pericardial effusion. Mitral Valve: The mitral valve is grossly normal. Mild to moderate mitral annular calcification. Trivial mitral valve regurgitation. No evidence of mitral valve stenosis. Tricuspid Valve: The tricuspid  valve is normal in structure. Tricuspid valve regurgitation is trivial. Aortic Valve: The aortic valve is tricuspid. There is mild calcification of the aortic valve. There is mild thickening of the aortic valve. Aortic valve regurgitation is not visualized. Aortic valve sclerosis/calcification is present, without any evidence of aortic stenosis. Aortic valve mean gradient measures 2.5 mmHg. Aortic valve peak gradient measures 4.6 mmHg. Aortic valve area, by VTI measures 2.12 cm. Pulmonic Valve: The pulmonic valve was normal in structure. Pulmonic valve regurgitation is trivial. Aorta: The aortic root and ascending aorta are structurally normal, with no evidence of dilitation. Venous: The inferior vena cava is normal in size with greater than 50% respiratory variability, suggesting right atrial pressure of 3 mmHg. IAS/Shunts: The atrial septum  is grossly normal. Agitated saline contrast bubble study was negative, with no evidence of any interatrial shunt.  LEFT VENTRICLE PLAX 2D LVIDd:         3.80 cm   Diastology LVIDs:         2.90 cm   LV e' medial:   5.11 cm/s LV PW:         1.40 cm   LV E/e' medial: 15.5 LV IVS:        1.40 cm LVOT diam:     1.90 cm LV SV:         49 LV SV Index:   25 LVOT Area:     2.84 cm  RIGHT VENTRICLE RV S prime:     13.50 cm/s TAPSE (M-mode): 1.4 cm LEFT ATRIUM             Index        RIGHT ATRIUM           Index LA diam:        3.40 cm 1.76 cm/m   RA Area:     12.70 cm LA Vol (A2C):   49.9 ml 25.88 ml/m  RA Volume:   23.30 ml  12.09 ml/m LA Vol (A4C):   32.6 ml 16.91 ml/m LA Biplane Vol: 40.9 ml 21.21 ml/m  AORTIC VALVE AV Area (Vmax):    2.13 cm AV Area (Vmean):   1.99 cm AV Area (VTI):     2.12 cm AV Vmax:           107.00 cm/s AV Vmean:          76.000 cm/s AV VTI:            0.229 m AV Peak Grad:      4.6 mmHg AV Mean Grad:      2.5 mmHg LVOT Vmax:         80.40 cm/s LVOT Vmean:        53.367 cm/s LVOT VTI:          0.171 m LVOT/AV VTI ratio: 0.75  AORTA Ao Root diam:  3.10 cm Ao Asc diam:  3.10 cm MITRAL VALVE MV Area (PHT): 3.03 cm    SHUNTS MV Decel Time: 250 msec    Systemic VTI:  0.17 m MV E velocity: 79.20 cm/s  Systemic Diam: 1.90 cm MV A velocity: 89.30 cm/s MV E/A ratio:  0.89 Gwyndolyn Kaufman MD Electronically signed by Gwyndolyn Kaufman MD Signature Date/Time: 10/15/2022/7:05:44 PM    Final     PHYSICAL EXAM General: Pleasant, well-appearing  in bed. No acute distress. Skin: Warm and dry. No obvious rash or lesions. Neuro: A&Ox3. CN II-XII intact. No cerebellar abnormalities. Moves all extremities. Normal ROM. 5/5 strength in RUE, LUE, RLE, and LLE. Grip strength bilaterally intact. Fine motor hand movements intact. Normal sensation. No focal deficit. No dysarthria. Psych: Normal mood and affect    ASSESSMENT/PLAN Andre Stout is a 82 y.o. male with PMH significant for HTN, prior complete heart block, prostrate cancer, hx of glaucoma who developed sudden onset R sided weakness with a [place] of 2215 on 10/16.  Stroke: Punctate L frontal MCA infarcts, embolic pattern, concerning for cardioembolic source Code Stroke CT head No acute abnormality. ASPECTS 10.    MRI  No acute intracranial abnormality. R CPA unchanged meningioma Repeat MRI shows small punctate acute infarcts in L frontal lobe MRA No large vessel occlusion and generally mild for age intracranial atherosclerosis, but there is isolated mild  to moderate stenosis in the distal 3rd Basilar artery. Carotid Doppler  carotids have no significant stenosis 2D Echo EF 60-65% LE venous Doppler negative for DVT LDL 104 HgbA1c 8.4 VTE prophylaxis - lovenox aspirin 81 mg daily prior to admission, now on aspirin 81 mg daily and clopidogrel 75 mg daily DAPT for three weeks and then clopidogrel alone afterwards. Per cardiology, will place a 14 day heart monitor and followup with Dr. Lovena Le on 11/17 to discuss pacemaker Therapy recommendations:  home health PT/OT Disposition:  home  Third degree  heart block No pacemaker yet Present on EKG HR 60s Cardiology Dr. Lovena Le follows outpatient to consider pacemaker 11/17  Hypertension Home meds:  norvasc '10mg'$ , lisinopril '20mg'$  Stable Long-term BP goal normotensive  Hyperlipidemia Home meds:  none LDL 104, goal < 70 Start lipitor 40 mg Continue statin at discharge  Diabetes type II Uncontrolled Home meds:  insulin 20U BID HgbA1c 8.4, goal < 7.0 CBGs SSI Close PCP follow-up for better DM control  Other Stroke Risk Factors Advanced Age >/= 35  Obesity, Body mass index is 31.33 kg/m., BMI >/= 30 associated with increased stroke risk, recommend weight loss, diet and exercise as appropriate   Other Active Problems  Cholecystitis s/p drainage  Surgery on board Drain capped today Follow up with surgery   Glaucoma Most likely explains peripheral vision deficits Follows optho outpatient  AKI, prerenal in the setting of dehydration from poor oral intake Baseline creatinine 0.9 with GFR greater than 60 Presented with creatinine 1.45 with GFR 48. Start NS at 50 cc/h x 1 day. Monitor urine output with strict I's and O's Avoid nephrotoxic agents, dehydration and hypotension. Repeat renal panel in the morning  GERD Resume home PPI, sucralfate  Hospital day # 0  Stormy Fabian, MD PGY-1 Psychiatry  ATTENDING NOTE: I reviewed above note and agree with the assessment and plan. Pt was seen and examined.   No family at bedside.  Patient right hand weakness and dexterity difficulty much improved today.  No significant neurodeficit.  Repeat MRI showed left frontal 3 punctate infarcts, embolic pattern, concerning for cardioembolic source.  Patient had complete heart block, follows with Dr. Lovena Le as outpatient.  Discussed with EP, will put on Zio patch for 14 days and then follow-up with Dr. Lovena Le on 11/17 to consider pacemaker.  Continue DAPT for 3 weeks and then Plavix alone, continue statin.  PT/OT recommend home health.  For  detailed assessment and plan, please refer to above/below as I have made changes wherever appropriate.   Neurology will sign off. Please call with questions. Pt will follow up with stroke clinic NP at [facility] in about 4 weeks. Thanks for the consult.   Rosalin Hawking, MD PhD Stroke Neurology 10/16/2022 6:37 PM    To contact Stroke Continuity provider, please refer to [email or web address] After hours, contact General Neurology

## 2022-10-16 NOTE — Progress Notes (Signed)
  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     Discussed with Dr. Lovena Le who recommends placing short term monitor and following up in the office to discuss pacemaker, given that Loop readings would likely be confounded by CHB / AV dissociation.   I have submitted a request to [employer] that we not be alerted to his known, stable CHB, but only for sustained bradycardia <40 bpm for 60 sec or greater.    Other alerts will remain in place unchanged.   He will follow up with Dr. Lovena Le 11/16/2022  Beryle Beams" [place], PA-C  10/16/2022 1:45 PM

## 2022-10-16 NOTE — Progress Notes (Addendum)
[facility] PROGRESS NOTE   Andre Stout KGU:[id number] DOB: [birth year] DOA: 10/15/2022  PCP: [name], FNP  Brief History/Interval Summary: 82 year old African-American male with history of complete heart block, essential hypertension, diabetes mellitus type 2, prostate cancer recently admitted for episodes of syncope and was found to have complete heart block.  Followed by cardiology.  Presented with right-sided weakness with dysarthria.  Initial MRI brain did not show acute stroke.  TIA was suspected.   Consultants: Neurology  Procedures: Echocardiogram    Subjective/Interval History: Patient mentions that he feels well this morning.  Denies any new complaints.  Weakness on the right side has resolved.     Assessment/Plan:  Acute stroke Initial MRI did not show any acute stroke.  Seen by neurology.  Subsequent MRI did show frontal infarcts. LDL is 38.  HbA1c 8.4. Carotid Dopplers without any significant stenosis. Echocardiogram shows normal systolic function. Seen by PT and OT and home health is recommended. Await neurology input this morning. Noted to be on aspirin and Plavix.  Acute kidney injury Came in with creatinine of 1.45.  Baseline renal function is normal.  Gently hydrated.  Noted to be better this morning.  Monitor urine output.  Avoid nephrotoxic agents.  Diabetes mellitus type 2, uncontrolled with hyperglycemia HbA1c 8.4.  CBGs noted to be elevated.  Prior to admission patient noted to be on NPH 20 units twice a day.  Only on SSI here in the hospital.  We will give him glargine.  Essential hypertension Permissive hypertension being allowed.  Holding his antihypertensives.  Hyperlipidemia LDL is 38.  Noted to be on atorvastatin.  Recent cholecystitis status post percutaneous cholecystostomy Was seen by general surgery during his previous hospitalization in August.  Underwent drainage procedure by interventional radiology.  Still  has a drain bag.  General surgery had planned interval cholecystectomy in a few weeks.  Neurology has reached out to general surgery.  Interventional radiology has seen the patient.  Drain has been capped.  Defer further management to general surgery.   Hypomagnesemia Will order magnesium sulfate.  Complete heart block Noted on EKG.  Previous history of same.  Seen by cardiologist Dr. Lovena Le during previous admission in August.  Was not thought to require pacemaker at that time.  Heart rate is greater than 60.  He is asymptomatic.  Continue to monitor on telemetry for now.  Keep potassium greater than 4.3.  Keep magnesium greater than 1.8. Remains asymptomatic.  Obesity Estimated body mass index is 31.33 kg/m as calculated from the following:   Height as of this encounter: '5\' 5"'$  (1.651 m).   Weight as of this encounter: 85.4 kg.   DVT Prophylaxis: Lovenox Code Status: Full code Family Communication: Discussed with patient Disposition Plan: Hopefully return home when cleared by neurology    Medications: Scheduled:  aspirin EC  81 mg Oral Daily   atorvastatin  40 mg Oral Daily   clopidogrel  75 mg Oral Daily   enoxaparin (LOVENOX) injection  40 mg Subcutaneous Q24H   insulin aspart  0-5 Units Subcutaneous QHS   insulin aspart  0-9 Units Subcutaneous TID WC   pantoprazole  40 mg Oral BID AC   sodium chloride flush  3 mL Intravenous Once   sucralfate  1 g Oral TID WC & HS   Continuous: SEG:[id number], melatonin, ondansetron (ZOFRAN) IV, polyethylene glycol  Antibiotics: Anti-infectives (From admission, onward)    None       Objective:  Vital Signs  Vitals:  10/16/22 0607 10/16/22 0700 10/16/22 0854 10/16/22 0935  BP:   128/67 (!) 177/65  Pulse:  76 71 76  Resp:  '17 16 16  '$ Temp: 98.7 F (37.1 C)  97.7 F (36.5 C) 97.8 F (36.6 C)  TempSrc: Oral  Oral Oral  SpO2:  98% 98% 97%  Weight:      Height:        Intake/Output Summary (Last 24 hours) at 10/16/2022  1213 Last data filed at 10/16/2022 0510 Gross per 24 hour  Intake --  Output 550 ml  Net -550 ml   Filed Weights   10/15/22 0030 10/15/22 0100  Weight: 95.4 kg 85.4 kg    General appearance: Awake alert.  In no distress Resp: Clear to auscultation bilaterally.  Normal effort Cardio: S1-S2 is normal regular.  No S3-S4.  No rubs murmurs or bruit GI: Abdomen is soft.  Nontender nondistended.  Bowel sounds are present normal.  No masses organomegaly    Lab Results:  Data Reviewed: I have personally reviewed following labs and reports of the imaging studies  CBC: Recent Labs  Lab 10/15/22 0026 10/15/22 0031 10/16/22 0546  WBC 9.4  --  11.1*  NEUTROABS 4.0  --   --   HGB 13.1 13.6 13.7  HCT 41.1 40.0 42.7  MCV 84.6  --  83.2  PLT 228  --  637    Basic Metabolic Panel: Recent Labs  Lab 10/15/22 0026 10/15/22 0031 10/16/22 0546  NA 134* 136 138  K 4.1 4.3 4.2  CL 103 105 104  CO2 19*  --  20*  GLUCOSE 171* 168* 202*  BUN '20 22 12  '$ CREATININE 1.45* 1.50* 1.04  CALCIUM 9.5  --  9.8  MG 1.8  --  1.5*    GFR: Estimated Creatinine Clearance: 56 mL/min (by C-G formula based on SCr of 1.04 mg/dL).  Liver Function Tests: Recent Labs  Lab 10/15/22 0026  AST 13*  ALT 10  ALKPHOS 79  BILITOT 1.0  PROT 6.7  ALBUMIN 3.4*     Coagulation Profile: Recent Labs  Lab 10/15/22 0026  INR 1.1     HbA1C: Recent Labs    10/15/22 1233  HGBA1C 8.4*    CBG: Recent Labs  Lab 10/15/22 1144 10/15/22 1247 10/15/22 1658 10/15/22 2148 10/16/22 0807  GLUCAP 149* 123* 304* 221* 286*    Lipid Profile: Recent Labs    10/15/22 1233  CHOL 111  HDL 46  LDLCALC 38  TRIG 134  CHOLHDL 2.4      Radiology Studies: MR BRAIN WO CONTRAST  Result Date: 10/16/2022 CLINICAL DATA:  Stroke follow-up. EXAM: MRI HEAD WITHOUT CONTRAST TECHNIQUE: Multiplanar, multiecho pulse sequences of the brain and surrounding structures were obtained without intravenous contrast.  COMPARISON:  Brain MRI from yesterday FINDINGS: Brain: Small acute cortical infarcts have occurred in the left frontal lobe adjacent to the sylvian fissure. Possible small acute infarcts in the left frontal white matter on coronal diffusion acquisition. Chronic small vessel ischemia in the cerebral white matter. 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 Generalized brain atrophy. Vascular: Major flow voids are preserved Skull and upper cervical spine: Normal marrow signal. Sinuses/Orbits: Bilateral cataract resection.  No acute finding IMPRESSION: 1. Small acute infarcts in the left frontal lobe. 2. Chronic small vessel ischemia and generalized brain atrophy. 3. 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 Electronically Signed   By: Jorje Guild M.D.   On: 10/16/2022 08:04   ECHOCARDIOGRAM COMPLETE  BUBBLE STUDY  Result Date: 10/15/2022    ECHOCARDIOGRAM REPORT   Patient Name:   Andre Stout Date of Exam: 10/15/2022 Medical Rec #:  [id number]      Height:       65.0 in Accession #:    [id number]     Weight:       188.3 lb Date of Birth:  August 20, 1940     BSA:          1.928 m Patient Age:    60 years       BP:           137/65 mmHg Patient Gender: M              HR:           64 bpm. Exam Location:  Inpatient Procedure: 2D Echo, Color Doppler, Cardiac Doppler and Intracardiac            Opacification Agent Indications:    TIA (transient ischemic attack) 435.9 / G45.9  History:        Patient has prior history of Echocardiogram examinations, most                 recent 08/16/2022. CAD, TIA, Signs/Symptoms:Syncope; Risk                 Factors:Hypertension and Diabetes.  Sonographer:    Ronny Flurry Referring Phys: [id number] [place]  1. Left ventricular ejection fraction, by estimation, is 60 to 65%. The left ventricle has normal function. The left ventricle has no regional wall motion abnormalities. There is moderate hypertrophy of the basal septum. The rest of the LV segments  demonstrate mild left ventricular hypertrophy. Left ventricular diastolic parameters are consistent with Grade I diastolic dysfunction (impaired relaxation).  2. Right ventricular systolic function is normal. The right ventricular size is normal.  3. The mitral valve is grossly normal. Trivial mitral valve regurgitation. No evidence of mitral stenosis.  4. The aortic valve is tricuspid. There is mild calcification of the aortic valve. There is mild thickening of the aortic valve. Aortic valve regurgitation is not visualized. Aortic valve sclerosis/calcification is present, without any evidence of aortic stenosis.  5. The inferior vena cava is normal in size with greater than 50% respiratory variability, suggesting right atrial pressure of 3 mmHg.  6. Agitated saline contrast bubble study was negative, with no evidence of any interatrial shunt. Comparison(s): No significant change from prior study. Conclusion(s)/Recommendation(s): No intracardiac source of embolism detected on this transthoracic study. Consider a transesophageal echocardiogram to exclude cardiac source of embolism if clinically indicated. FINDINGS  Left Ventricle: Left ventricular ejection fraction, by estimation, is 60 to 65%. The left ventricle has normal function. The left ventricle has no regional wall motion abnormalities. Definity contrast agent was given IV to delineate the left ventricular  endocardial borders. The left ventricular internal cavity size was normal in size. There is moderate hypertrophy of the basal septum. The rest of the LV segments demonstrate mild left ventricular hypertrophy. Left ventricular diastolic parameters are consistent with Grade I diastolic dysfunction (impaired relaxation). Right Ventricle: The right ventricular size is normal. No increase in right ventricular wall thickness. Right ventricular systolic function is normal. Left Atrium: Left atrial size was normal in size. Right Atrium: Right atrial size was  normal in size. Pericardium: There is no evidence of pericardial effusion. Mitral Valve: The mitral valve is grossly normal. Mild to moderate mitral annular calcification. Trivial mitral valve regurgitation. No  evidence of mitral valve stenosis. Tricuspid Valve: The tricuspid valve is normal in structure. Tricuspid valve regurgitation is trivial. Aortic Valve: The aortic valve is tricuspid. There is mild calcification of the aortic valve. There is mild thickening of the aortic valve. Aortic valve regurgitation is not visualized. Aortic valve sclerosis/calcification is present, without any evidence of aortic stenosis. Aortic valve mean gradient measures 2.5 mmHg. Aortic valve peak gradient measures 4.6 mmHg. Aortic valve area, by VTI measures 2.12 cm. Pulmonic Valve: The pulmonic valve was normal in structure. Pulmonic valve regurgitation is trivial. Aorta: The aortic root and ascending aorta are structurally normal, with no evidence of dilitation. Venous: The inferior vena cava is normal in size with greater than 50% respiratory variability, suggesting right atrial pressure of 3 mmHg. IAS/Shunts: The atrial septum is grossly normal. Agitated saline contrast bubble study was negative, with no evidence of any interatrial shunt.  LEFT VENTRICLE PLAX 2D LVIDd:         3.80 cm   Diastology LVIDs:         2.90 cm   LV e' medial:   5.11 cm/s LV PW:         1.40 cm   LV E/e' medial: 15.5 LV IVS:        1.40 cm LVOT diam:     1.90 cm LV SV:         49 LV SV Index:   25 LVOT Area:     2.84 cm  RIGHT VENTRICLE RV S prime:     13.50 cm/s TAPSE (M-mode): 1.4 cm LEFT ATRIUM             Index        RIGHT ATRIUM           Index LA diam:        3.40 cm 1.76 cm/m   RA Area:     12.70 cm LA Vol (A2C):   49.9 ml 25.88 ml/m  RA Volume:   23.30 ml  12.09 ml/m LA Vol (A4C):   32.6 ml 16.91 ml/m LA Biplane Vol: 40.9 ml 21.21 ml/m  AORTIC VALVE AV Area (Vmax):    2.13 cm AV Area (Vmean):   1.99 cm AV Area (VTI):     2.12 cm AV  Vmax:           107.00 cm/s AV Vmean:          76.000 cm/s AV VTI:            0.229 m AV Peak Grad:      4.6 mmHg AV Mean Grad:      2.5 mmHg LVOT Vmax:         80.40 cm/s LVOT Vmean:        53.367 cm/s LVOT VTI:          0.171 m LVOT/AV VTI ratio: 0.75  AORTA Ao Root diam: 3.10 cm Ao Asc diam:  3.10 cm MITRAL VALVE MV Area (PHT): 3.03 cm    SHUNTS MV Decel Time: 250 msec    Systemic VTI:  0.17 m MV E velocity: 79.20 cm/s  Systemic Diam: 1.90 cm MV A velocity: 89.30 cm/s MV E/A ratio:  0.89 Gwyndolyn Kaufman MD Electronically signed by Gwyndolyn Kaufman MD Signature Date/Time: 10/15/2022/7:05:44 PM    Final    VAS US CAROTID  Result Date: 10/15/2022 Carotid Arterial Duplex Study Patient Name:  Andre Stout  Date of Exam:   10/15/2022 Medical Rec #: [id number]  Accession #:    [id number] Date of Birth: 1940/07/06      Patient Gender: M Patient Age:   85 years Exam Location:  [facility] Procedure:      VAS US CAROTID Referring Phys: Irene Pap --------------------------------------------------------------------------------  Indications:       TIA. Risk Factors:      Hypertension, Diabetes. Comparison Study:  no prior Performing Technologist: Archie Patten RVS  Examination Guidelines: A complete evaluation includes B-mode imaging, spectral Doppler, color Doppler, and power Doppler as needed of all accessible portions of each vessel. Bilateral testing is considered an integral part of a complete examination. Limited examinations for reoccurring indications may be performed as noted.  Right Carotid Findings: +----------+--------+--------+--------+------------------+--------+           PSV cm/sEDV cm/sStenosisPlaque DescriptionComments +----------+--------+--------+--------+------------------+--------+ CCA Prox  112     10              heterogenous               +----------+--------+--------+--------+------------------+--------+ CCA Distal71      8               heterogenous                +----------+--------+--------+--------+------------------+--------+ ICA Prox  67      14      1-39%   heterogenous               +----------+--------+--------+--------+------------------+--------+ ICA Distal55      11                                         +----------+--------+--------+--------+------------------+--------+ ECA       128                                                +----------+--------+--------+--------+------------------+--------+ +----------+--------+-------+--------+-------------------+           PSV cm/sEDV cmsDescribeArm Pressure (mmHG) +----------+--------+-------+--------+-------------------+ Subclavian170                                        +----------+--------+-------+--------+-------------------+ +---------+--------+--+--------+-+---------+ VertebralPSV cm/s45EDV cm/s8Antegrade +---------+--------+--+--------+-+---------+  Left Carotid Findings: +----------+--------+--------+--------+------------------+--------+           PSV cm/sEDV cm/sStenosisPlaque DescriptionComments +----------+--------+--------+--------+------------------+--------+ CCA Prox  127     18              heterogenous               +----------+--------+--------+--------+------------------+--------+ CCA Distal104     13              heterogenous               +----------+--------+--------+--------+------------------+--------+ ICA Prox  65      15      1-39%   heterogenous               +----------+--------+--------+--------+------------------+--------+ ICA Distal55      12                                         +----------+--------+--------+--------+------------------+--------+ ECA  108                                                +----------+--------+--------+--------+------------------+--------+ +----------+--------+--------+--------+-------------------+           PSV cm/sEDV cm/sDescribeArm Pressure (mmHG)  +----------+--------+--------+--------+-------------------+ [id number]                                         +----------+--------+--------+--------+-------------------+ +---------+--------+--+--------+--+---------+ VertebralPSV cm/s45EDV cm/s10Antegrade +---------+--------+--+--------+--+---------+   Summary: Right Carotid: Velocities in the right ICA are consistent with a 1-39% stenosis. Left Carotid: Velocities in the left ICA are consistent with a 1-39% stenosis. Vertebrals: Bilateral vertebral arteries demonstrate antegrade flow. *See table(s) above for measurements and observations.  Electronically signed by Antony Contras MD on 10/15/2022 at 10:41:29 AM.    Final    MR ANGIO HEAD WO CONTRAST  Result Date: 10/15/2022 CLINICAL DATA:  82 year old male with right side weakness. MRI head earlier today negative for acute infarct. EXAM: MRA HEAD WITHOUT CONTRAST TECHNIQUE: Angiographic images of the Circle of Willis were acquired using MRA technique without intravenous contrast. COMPARISON:  Brain MRI 0058 hours today. FINDINGS: Anterior circulation: Antegrade flow in both ICA siphons. Mild siphon irregularity, no convincing siphon stenosis. Patent carotid termini. MCA and ACA origins appear normal. Anterior communicating artery is within normal limits. The left ACA is mildly dominant throughout. ACA branches are within normal limits. MCA M1 segments and MCA bi/trifurcations are patent without stenosis. Visible bilateral MCA branches are within normal limits. Posterior circulation: Antegrade flow in the posterior circulation with patent codominant distal vertebral arteries and vertebrobasilar junction without evidence of stenosis. Patent PICA origins. Patent basilar artery with mild to moderate irregularity and stenosis at the distal 3rd of the basilar, series 1055, image 11). Patent basilar tip, SCA and PCA origins. Visible PCA branches are within normal limits. Anatomic variants: None  significant. Other: Right cerebellopontine angle mass, see comparison MRI. 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 Electronically Signed   By: Genevie Ann M.D.   On: 10/15/2022 08:34   MR BRAIN WO CONTRAST  Result Date: 10/15/2022 CLINICAL DATA:  Right-sided weakness EXAM: MRI HEAD WITHOUT CONTRAST TECHNIQUE: Multiplanar, multiecho pulse sequences of the brain and surrounding structures were obtained without intravenous contrast. COMPARISON:  08/01/2022 FINDINGS: Brain: No acute infarct, mass effect or extra-axial collection. No acute or chronic hemorrhage. Faint areas of hyperintensity on diffusion-weighted imaging in the left frontal white matter likely are due to T2 shine through effects. There is multifocal periventricular white matter hyperintensity, most often a result of chronic microvascular ischemia. There is generalized atrophy without lobar predilection. The midline structures are normal. 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 Vascular: Major flow voids are preserved. Skull and upper cervical spine: Normal calvarium and skull base. Visualized upper cervical spine and soft tissues are normal. Sinuses/Orbits:No paranasal sinus fluid levels or advanced mucosal thickening. No mastoid or middle ear effusion. Normal orbits. IMPRESSION: 1. No acute intracranial abnormality. 2. Generalized atrophy and findings of chronic small vessel ischemia. 3. Unchanged right CP angle meningioma. Electronically Signed   By: Ulyses Jarred M.D.   On: 10/15/2022 01:26   CT  HEAD CODE STROKE WO CONTRAST  Result Date: 10/15/2022 CLINICAL DATA:  Code stroke.  Right-sided weakness EXAM: CT HEAD WITHOUT CONTRAST TECHNIQUE: Contiguous axial images were obtained from the base of the skull through the vertex without intravenous contrast. RADIATION DOSE  REDUCTION: This exam was performed according to the departmental dose-optimization program which includes automated exposure control, adjustment of the mA and/or kV according to patient size and/or use of iterative reconstruction technique. COMPARISON:  None Available. FINDINGS: Brain: There is no mass, hemorrhage or extra-axial collection. There is generalized atrophy without lobar predilection. There is hypoattenuation of the periventricular white matter, most commonly indicating chronic ischemic microangiopathy. Vascular: No abnormal hyperdensity of the major intracranial arteries or dural venous sinuses. No intracranial atherosclerosis. Skull: The visualized skull base, calvarium and extracranial soft tissues are normal. Sinuses/Orbits: No fluid levels or advanced mucosal thickening of the visualized paranasal sinuses. No mastoid or middle ear effusion. The orbits are normal. ASPECTS [facility] Stroke Program Early CT Score) - Ganglionic level infarction (caudate, lentiform nuclei, internal capsule, insula, M1-M3 cortex): 7 - Supraganglionic infarction (M4-M6 cortex): 3 Total score (0-10 with 10 being normal): 10 IMPRESSION: 1. No acute intracranial abnormality. 2. ASPECTS is 10 These results were communicated to Dr. Donnetta Simpers at 12:38 am on 10/15/2022 by text page via the [facility] messaging system. Electronically Signed   By: Ulyses Jarred M.D.   On: 10/15/2022 00:38       LOS: 0 days   [facility] Pager on [email or web address]  10/16/2022, 12:13 PM

## 2022-10-16 NOTE — Progress Notes (Signed)
Spoke with patient's Daughter Sherrie Mustache about plan of care. She has concerns about his discharge meds. Will notify hospitalist about the meds.

## 2022-10-16 NOTE — ED Notes (Signed)
Patient transported to MRI 

## 2022-10-16 NOTE — Progress Notes (Signed)
NURSING PROGRESS NOTE  Andre Stout [id number] Admission Data: 10/16/2022 12:22 PM Attending Provider: Bonnielee Haff, MD LYY:[id number], Marvis Repress, FNP Code Status: Full   Andre Stout is a 82 y.o. male patient admitted from ED:  -No acute distress noted.  -No complaints of shortness of breath.  -No complaints of chest pain.   Cardiac Monitoring: Box # 16 in place. Cardiac monitor yields: 3rd degree block  Blood pressure (!) 177/65, pulse 76, temperature 97.8 F (36.6 C), temperature source Oral, resp. rate 16, height '5\' 5"'$  (1.651 m), weight 85.4 kg, SpO2 97 %.   IV Fluids:  IV in place, occlusive dsg intact without redness, IV cath upper arm right and left, condition patent and no redness none.   Allergies:  Patient has no known allergies.  Past Medical History:   has a past medical history of Diabetes mellitus without complication ([place]), Glaucoma, Heart block, Hypertension, and Prostate cancer ([place]).  Past Surgical History:   has a past surgical history that includes Cataract extraction (Bilateral); Insertion prostate radiation seed; IR Perc Cholecystostomy (08/17/2022); IR Radiologist Eval & Mgmt (09/28/2022); and IR Radiologist Eval & Mgmt (10/10/2022).  Social History:   reports that he quit smoking about 47 years ago. His smoking use included cigarettes. He has a 2.00 pack-year smoking history. He has never used smokeless tobacco. He reports that he does not drink alcohol and does not use drugs.  Skin: Clean Dry and Intact. Skin is extremely dry.  Patient has gallbladder tube placed during previous admit. Skin around it is clean, dry and intact.  Patient/Family orientated to room. Information packet given to patient/family. Admission inpatient armband information verified with patient/family to include name and date of birth and placed on patient arm. Side rails up x 2, fall assessment and education completed with patient/family. Patient/family able to verbalize  understanding of risk associated with falls and verbalized understanding to call for assistance before getting out of bed. Call light within reach. Patient/family able to voice and demonstrate understanding of unit orientation instructions.    Will continue to evaluate and treat per MD orders.

## 2022-10-16 NOTE — Progress Notes (Signed)
The patient has difficulty ambulating to the bathroom in a timely manner. He will need the [facility] to assist with this.

## 2022-10-16 NOTE — ED Notes (Signed)
Pt found standing at the side of bed. Gait unsteady as he uses a walker at home. Pt states wanting to go to the bathroom. Pt assisted to bathroom and back to bed. Pt informed to used call bell when getting up. Call bell within reach. Bed alarm placed for when pt moves to side of the bed. Pt verbalized understanding of importance to use call bell to get assistance going to the bathroom to reduce the risk of falls.

## 2022-10-17 ENCOUNTER — Telehealth: Payer: Self-pay

## 2022-10-17 NOTE — Telephone Encounter (Signed)
Transition Care Management Unsuccessful Follow-up Telephone Call  Date of discharge and from where:  [facility] 10/16/2022  Attempts:  1st Attempt  Reason for unsuccessful TCM follow-up call:  Left voice message

## 2022-10-18 NOTE — Telephone Encounter (Signed)
Transition Care Management Unsuccessful Follow-up Telephone Call  Date of discharge and from where:  [facility] 10/16/2022  Attempts:  2nd Attempt  Reason for unsuccessful TCM follow-up call:  Left voice message Juanda Crumble, [place] Direct Dial [phone]

## 2022-10-19 ENCOUNTER — Telehealth: Payer: Self-pay | Admitting: Family

## 2022-10-19 NOTE — Telephone Encounter (Signed)
Noted fyi to provider

## 2022-10-19 NOTE — Telephone Encounter (Signed)
[name] ([facility])  called to advise PCP of missed PT appt.

## 2022-10-23 ENCOUNTER — Telehealth (INDEPENDENT_AMBULATORY_CARE_PROVIDER_SITE_OTHER): Payer: Medicare Other | Admitting: Family

## 2022-10-23 ENCOUNTER — Encounter: Payer: Self-pay | Admitting: Family

## 2022-10-23 VITALS — Ht 65.0 in

## 2022-10-23 DIAGNOSIS — I10 Essential (primary) hypertension: Secondary | ICD-10-CM | POA: Diagnosis not present

## 2022-10-23 DIAGNOSIS — E1022 Type 1 diabetes mellitus with diabetic chronic kidney disease: Secondary | ICD-10-CM

## 2022-10-23 DIAGNOSIS — G459 Transient cerebral ischemic attack, unspecified: Secondary | ICD-10-CM | POA: Diagnosis not present

## 2022-10-23 DIAGNOSIS — N183 Chronic kidney disease, stage 3 unspecified: Secondary | ICD-10-CM

## 2022-10-23 DIAGNOSIS — K810 Acute cholecystitis: Secondary | ICD-10-CM

## 2022-10-23 DIAGNOSIS — R059 Cough, unspecified: Secondary | ICD-10-CM

## 2022-10-23 MED ORDER — PANTOPRAZOLE SODIUM 40 MG PO TBEC: 40.0000 mg | DELAYED_RELEASE_TABLET | Freq: Two times a day (BID) | ORAL | 2 refills | Status: DC

## 2022-10-23 MED ORDER — BENZONATATE 100 MG PO CAPS: 100.0000 mg | ORAL_CAPSULE | Freq: Three times a day (TID) | ORAL | 0 refills | Status: DC | PRN

## 2022-10-23 MED ORDER — LISINOPRIL 20 MG PO TABS: 20.0000 mg | ORAL_TABLET | Freq: Every day | ORAL | 3 refills | Status: DC

## 2022-10-23 MED ORDER — AMLODIPINE BESYLATE 10 MG PO TABS: 10.0000 mg | ORAL_TABLET | Freq: Every day | ORAL | 3 refills | Status: DC

## 2022-10-23 NOTE — Progress Notes (Signed)
Andre Stout is a 82 y.o. 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   Diarrhea 07/31/2022   Seizure ([place]) 01/02/2022   Abnormal EKG 01/02/2022   Acute hypokalemia 01/02/2022   Diabetes mellitus without complication ([place]) 24/40/1027   Syncope and collapse 10/09/2020   Prostate cancer ([place]) 03/25/2019   Hypophosphatemia 03/06/2016   Hypomagnesemia 03/06/2016   Influenza A 03/05/2016   Benign essential HTN 03/05/2016   AKI (acute kidney injury) ([place]) 03/04/2016   Routine general medical examination at a health care facility 05/02/2015   Medicare annual wellness visit, subsequent 05/02/2015    Current Outpatient Medications  Medication Sig Dispense Refill   acetaminophen (TYLENOL) 500 MG tablet Take 1,000 mg by mouth daily as needed (pain).     aspirin 81 MG chewable tablet Chew 1 tablet (81 mg total) by mouth daily for 21 days. For 3 weeks only 21 tablet 0   atorvastatin (LIPITOR) 40 MG tablet Take 1 tablet (40 mg total) by mouth daily. 30 tablet 2   BD INSULIN SYRINGE U/F 31G X 5/16" 1 ML MISC USE 2 DAILY 200 each 4   benzonatate (TESSALON) 100 MG capsule Take 1 capsule (100 mg total) by mouth 3 (three) times daily as needed. 20 capsule 0   Blood Glucose Monitoring Suppl (ACCU-CHEK GUIDE) w/Device KIT Use As Directed 1 kit 0   brimonidine (ALPHAGAN) 0.2 % ophthalmic solution Place 1 drop into both eyes 2 (two) times daily with breakfast and lunch.     clopidogrel (PLAVIX) 75 MG tablet Take 1 tablet (75 mg total) by mouth daily. 30 tablet 2   dorzolamide-timolol (COSOPT) 22.3-6.8 MG/ML ophthalmic solution Place 1 drop into both eyes 2 (two) times daily with  breakfast and lunch.     insulin NPH-regular Human (70-30) 100 UNIT/ML injection Inject 20 Units into the skin 2 (two) times daily with a meal. 10 mL 11   latanoprost (XALATAN) 0.005 % ophthalmic solution Place 1 drop into both eyes at bedtime.     magnesium oxide (MAG-OX) 400 (240 Mg) MG tablet Take 1 tablet (400 mg total) by mouth 2 (two) times daily. 60 tablet 11   Multiple Vitamins-Minerals (PRESERVISION AREDS 2) CAPS Take 1 capsule by mouth at bedtime.     ONETOUCH DELICA LANCETS 25D MISC Use to check blood sugar 4 times per day. Dx code: E11.9 200 each 2   ONETOUCH ULTRA test strip USE TO MONITOR GLUCOSE LEVELS 4 TIMES PER DAY E11.9 100 each 2   sodium chloride flush 0.9 % SOLN injection Inject 5 mLs into the vein as needed for up to 30 doses. Discard remainder of syringe. (Patient taking differently: Inject 5 mLs into the vein daily.) 300 mL 0   sucralfate (CARAFATE) 1 GM/10ML suspension Take 10 mLs (1 g total) by mouth 4 (four) times daily -  with meals and at bedtime. 420 mL 0   amLODipine (NORVASC) 10 MG tablet Take 1 tablet (10 mg total) by mouth daily. 90 tablet 3   lisinopril (ZESTRIL) 20 MG tablet Take 1 tablet (20 mg total) by mouth daily. 90 tablet 3   pantoprazole (PROTONIX) 40 MG tablet Take 1 tablet (  40 mg total) by mouth 2 (two) times daily before a meal. 60 tablet 2   No current facility-administered medications for this visit.    Allergies: Patient has no known allergies.  Past Medical History:  Diagnosis Date   Diabetes mellitus without complication (HCC)    Glaucoma    Heart block    complete heart block   Hypertension    Prostate cancer ([place])    Been 3-4 years ago    Past Surgical History:  Procedure Laterality Date   CATARACT EXTRACTION Bilateral    INSERTION PROSTATE RADIATION SEED     IR PERC CHOLECYSTOSTOMY  08/17/2022   IR RADIOLOGIST EVAL & MGMT  09/28/2022   IR RADIOLOGIST EVAL & MGMT  10/10/2022    Family History  Problem Relation Age of Onset    Diabetes Father    Diabetes Paternal Grandfather     Social History   Tobacco Use   Smoking status: Former    Packs/day: 0.50    Years: 4.00    Total pack years: 2.00    Types: Cigarettes    Quit date: 10/29/1974    Years since quitting: 48.0   Smokeless tobacco: Never  Substance Use Topics   Alcohol use: No    Subjective:    I connected with [name] on 10/23/22 at 11:20 AM EDT by a video enabled telemedicine application and verified that I am speaking with the correct person using two identifiers.   I discussed the limitations of evaluation and management by telemedicine and the availability of in person appointments. The patient expressed understanding and agreed to proceed. Provider in office/ patient is at home; provider and patient are only 2 people on video call.   Hospital follow-up- patient and family requested virtual visit;  Patient was admitted with stroke like symptoms on 10/16 and discharged on 10/17; has been recommended to follow up with neurology and cardiology; does have those appointments scheduled;   Has not established with new endocrinologist- his long time provider retired earlier this year; last Hgba1c was 8.4;   Patient has had dry cough x 1 week- requesting refill on [employer];    Per daughter, home health orders are in place for PT and OT; patient is moving well at home;    Objective:  Vitals:   10/23/22 1114  Height: 5' 5" (1.651 m)    General: Well developed, well nourished, in no acute distress  Skin : Warm and dry.  Head: Normocephalic and atraumatic  Lungs: Respirations unlabored;  Neurologic: Alert and oriented; speech intact; face symmetrical;   Assessment:  1. Benign essential HTN   2. 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 TIA (transient ischemic attack)   4. Acute cholecystitis   5. Cough, unspecified type     Plan:  Stable; refill  updated; Stressed need to get established with endocrine; daughter prefers to schedule appointment and she plans to call [facility]; Keep planned follow up with neurology for November; Keep planned follow up with IR and general surgery;  Refill updated on Tessalon Perles as requested- has been beneficial in the past; however if no improvement in 1-2 weeks, to consider OV and/or changing from Lisinopril.   No follow-ups on file.  No orders of the defined types were placed in this encounter.   Requested Prescriptions   Signed Prescriptions Disp Refills   pantoprazole (PROTONIX) 40 MG tablet 60 tablet  2    Sig: Take 1 tablet (40 mg total) by mouth 2 (two) times daily before a meal.   amLODipine (NORVASC) 10 MG tablet 90 tablet 3    Sig: Take 1 tablet (10 mg total) by mouth daily.   lisinopril (ZESTRIL) 20 MG tablet 90 tablet 3    Sig: Take 1 tablet (20 mg total) by mouth daily.   benzonatate (TESSALON) 100 MG capsule 20 capsule 0    Sig: Take 1 capsule (100 mg total) by mouth 3 (three) times daily as needed.

## 2022-10-24 ENCOUNTER — Telehealth: Payer: Self-pay | Admitting: Family

## 2022-10-24 NOTE — Telephone Encounter (Signed)
Patient's daughter called stating the patient's insurance does not want to cover but 90 pills of his protonix, and that the pharmacy sent over a paper to let us know. Please advise

## 2022-10-25 NOTE — Telephone Encounter (Signed)
I have called the pharmacy and they stated that a PA maybe needed.   Insurance company: [phone]  PA: 61 for [place] Case ID: [id number] Need help? Call us at [phone] Status Sent to [facility] Pantoprazole Sodium '40MG'$  dr tablets Form [facility] Electronic Prior Authorization Form [phone] NCPDP)

## 2022-10-25 NOTE — Telephone Encounter (Signed)
I have called pt daughter back and she stated that the insurance company stated that the medication is not covered anymore since he met his max. I was very confused and so was the daughter. I will call the pharmacy and see what the discrepancy is.

## 2022-10-26 NOTE — Telephone Encounter (Signed)
The PA determination was a denial.

## 2022-10-26 NOTE — Telephone Encounter (Signed)
I have called the daughter and informed her that the PA was denied and she can call the insurance company to find out what is cover. Pt daughter stated that they will pay for out of pocket for now and will call insurance a bit later.

## 2022-10-27 ENCOUNTER — Other Ambulatory Visit (HOSPITAL_COMMUNITY): Payer: Self-pay

## 2022-11-09 ENCOUNTER — Other Ambulatory Visit (HOSPITAL_COMMUNITY): Payer: Self-pay

## 2022-11-09 ENCOUNTER — Telehealth: Payer: Self-pay | Admitting: Family

## 2022-11-09 NOTE — Telephone Encounter (Signed)
I have called the [facility] agency and informed them that pt has an ENDO provider and gave her the name of that providers office.

## 2022-11-09 NOTE — Telephone Encounter (Signed)
Andre Stout from [facility] number: [phone]  PT from [facility] called asking if there would be parameters placed for the patient's blood sugar levels. He stated a call back to the office would be good. Please advise.

## 2022-11-12 ENCOUNTER — Telehealth: Payer: Self-pay

## 2022-11-12 NOTE — Telephone Encounter (Signed)
Blood sugar parameters requested for when pt is checking blood sugars. Called and advised pt had not been seen since 02/07/22 by his provider who has since retired. Per last office notes pt is to call if blood sugar drops under 70 or are consistently over 200.

## 2022-11-14 ENCOUNTER — Other Ambulatory Visit (HOSPITAL_COMMUNITY): Payer: Self-pay

## 2022-11-16 ENCOUNTER — Ambulatory Visit: Payer: Medicare Other | Attending: Internal Medicine | Admitting: Internal Medicine

## 2022-11-16 ENCOUNTER — Telehealth: Payer: Self-pay | Admitting: Family

## 2022-11-16 ENCOUNTER — Encounter: Payer: Self-pay | Admitting: Internal Medicine

## 2022-11-16 VITALS — BP 92/58 | HR 66 | Ht 65.0 in | Wt 171.4 lb

## 2022-11-16 DIAGNOSIS — R55 Syncope and collapse: Secondary | ICD-10-CM

## 2022-11-16 DIAGNOSIS — I10 Essential (primary) hypertension: Secondary | ICD-10-CM

## 2022-11-16 DIAGNOSIS — I442 Atrioventricular block, complete: Secondary | ICD-10-CM

## 2022-11-16 DIAGNOSIS — G459 Transient cerebral ischemic attack, unspecified: Secondary | ICD-10-CM | POA: Diagnosis not present

## 2022-11-16 NOTE — Patient Instructions (Addendum)
Medication Instructions:  Your physician recommends that you continue on your current medications as directed. Please refer to the Current Medication list given to you today.  *If you need a refill on your cardiac medications before your next appointment, please call your pharmacy*  Lab Work: None ordered.  If you have labs (blood work) drawn today and your tests are completely normal, you will receive your results only by: MyChart Message (if you have MyChart) OR A paper copy in the mail If you have any lab test that is abnormal or we need to change your treatment, we will call you to review the results.  Testing/Procedures: None ordered.  Follow-Up: At [facility], you and your health needs are our priority.  As part of our continuing mission to provide you with exceptional heart care, we have created designated Provider Care Teams.  These Care Teams include your primary Cardiologist (physician) and Advanced Practice Providers (APPs -  Physician Assistants and Nurse Practitioners) who all work together to provide you with the care you need, when you need it.  We recommend signing up for the patient portal called "MyChart".  Sign up information is provided on this After Visit Summary.  MyChart is used to connect with patients for Virtual Visits (Telemedicine).  Patients are able to view lab/test results, encounter notes, upcoming appointments, etc.  Non-urgent messages can be sent to your provider as well.   To learn more about what you can do with MyChart, go to [email or web address].    Your next appointment:   As Needed   The format for your next appointment:   In Person  Provider:   Gregg Taylor, MD{or one of the following Advanced Practice Providers on your designated Care Team:   Renee Ursuy, PA-C Michael "Andy" Tillery, PA-C    Important Information About Sugar       

## 2022-11-16 NOTE — Progress Notes (Signed)
HPI Mr .Wiegel returns today for followup. He is a pleasant 82 yo man with a h/o syncope and intermittent CHB with a narrow escape in the 50s. He has done well in the interim. He has no sob and denies recent syncope. 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 He has been wearing a cardiac monitor but the results are stillo pending as he has not turned it in yet. He has been active without limit. No Known Allergies   Current Outpatient Medications  Medication Sig Dispense Refill   acetaminophen (TYLENOL) 500 MG tablet Take 1,000 mg by mouth daily as needed (pain).     amLODipine (NORVASC) 10 MG tablet Take 1 tablet (10 mg total) by mouth daily. 90 tablet 3   atorvastatin (LIPITOR) 40 MG tablet Take 1 tablet (40 mg total) by mouth daily. 30 tablet 2   BD INSULIN SYRINGE U/F 31G X 5/16" 1 ML MISC USE 2 DAILY 200 each 4   benzonatate (TESSALON) 100 MG capsule Take 1 capsule (100 mg total) by mouth 3 (three) times daily as needed. 20 capsule 0   Blood Glucose Monitoring Suppl (ACCU-CHEK GUIDE) w/Device KIT Use As Directed 1 kit 0   brimonidine (ALPHAGAN) 0.2 % ophthalmic solution Place 1 drop into both eyes 2 (two) times daily with breakfast and lunch.     clopidogrel (PLAVIX) 75 MG tablet Take 1 tablet (75 mg total) by mouth daily. 30 tablet 2   dorzolamide-timolol (COSOPT) 22.3-6.8 MG/ML ophthalmic solution Place 1 drop into both eyes 2 (two) times daily with breakfast and lunch.     insulin NPH-regular Human (70-30) 100 UNIT/ML injection Inject 20 Units into the skin 2 (two) times daily with a meal. 10 mL 11   latanoprost (XALATAN) 0.005 % ophthalmic solution Place 1 drop into both eyes at bedtime.     lisinopril (ZESTRIL) 20 MG tablet Take 1 tablet (20 mg total) by mouth daily. 90 tablet 3   magnesium oxide (MAG-OX) 400 (240 Mg) MG tablet Take 1 tablet (400 mg total) by mouth 2 (two) times daily. 60 tablet 11   Multiple Vitamins-Minerals (PRESERVISION AREDS 2) CAPS Take  1 capsule by mouth at bedtime.     ONETOUCH DELICA LANCETS 03E MISC Use to check blood sugar 4 times per day. Dx code: E11.9 200 each 2   ONETOUCH ULTRA test strip USE TO MONITOR GLUCOSE LEVELS 4 TIMES PER DAY E11.9 100 each 2   pantoprazole (PROTONIX) 40 MG tablet Take 1 tablet (40 mg total) by mouth 2 (two) times daily before a meal. 60 tablet 2   sodium chloride flush 0.9 % SOLN injection Inject 5 mLs into the vein as needed for up to 30 doses. Discard remainder of syringe. (Patient taking differently: Inject 5 mLs into the vein daily.) 300 mL 0   No current facility-administered medications for this visit.     Past Medical History:  Diagnosis Date   Diabetes mellitus without complication ([place])    Glaucoma    Heart block    complete heart block   Hypertension    Prostate cancer ([place])    Been 3-4 years ago    ROS:   All systems reviewed and negative except as noted in the HPI.   Past Surgical History:  Procedure Laterality Date   CATARACT EXTRACTION Bilateral    INSERTION PROSTATE RADIATION SEED     IR PERC CHOLECYSTOSTOMY  08/17/2022   IR RADIOLOGIST EVAL &  MGMT  09/28/2022   IR RADIOLOGIST EVAL & MGMT  10/10/2022     Family History  Problem Relation Age of Onset   Diabetes Father    Diabetes Paternal Grandfather      Social History   Socioeconomic History   Marital status: Widowed    Spouse name: Not on file   Number of children: 1   Years of education: 59   Highest education level: Not on file  Occupational History   Occupation: Retired  Tobacco Use   Smoking status: Former    Packs/day: 0.50    Years: 4.00    Total pack years: 2.00    Types: Cigarettes    Quit date: 10/29/1974    Years since quitting: 48.0   Smokeless tobacco: Never  Vaping Use   Vaping Use: Never used  Substance and Sexual Activity   Alcohol use: No   Drug use: No   Sexual activity: Not Currently  Other Topics Concern   Not on file  Social History Narrative   Born and raised  in [place], [place]. Currently reside in a private residence by himself. Daughter lives in the area. No live. Fun: hunt and fish   Denies religious beliefs that would effect health care.    Social Determinants of Health   Financial Resource Strain: Low Risk  (03/26/2022)   Overall Financial Resource Strain (CARDIA)    Difficulty of Paying Living Expenses: Not hard at all  Food Insecurity: No Food Insecurity (10/16/2022)   Hunger Vital Sign    Worried About Running Out of Food in the Last Year: Never true    Ran Out of Food in the Last Year: Never true  Transportation Needs: No Transportation Needs (10/16/2022)   PRAPARE - Hydrologist (Medical): No    Lack of Transportation (Non-Medical): No  Physical Activity: Inactive (03/26/2022)   Exercise Vital Sign    Days of Exercise per Week: 0 days    Minutes of Exercise per Session: 0 min  Stress: No Stress Concern Present (03/26/2022)   [place]    Feeling of Stress : Not at all  Social Connections: Unknown (05/28/2019)   Social Connection and Isolation Panel [NHANES]    Frequency of Communication with Friends and Family: More than three times a week    Frequency of Social Gatherings with Friends and Family: More than three times a week    Attends Religious Services: Not on file    Active Member of Clubs or Organizations: Yes    Attends Archivist Meetings: More than 4 times per year    Marital Status: Widowed  Intimate Partner Violence: Not At Risk (10/16/2022)   Humiliation, Afraid, Rape, and Kick questionnaire    Fear of Current or Ex-Partner: No    Emotionally Abused: No    Physically Abused: No    Sexually Abused: No     BP (!) 92/58   Pulse 66   Ht _0  (1.651 m)   Wt 171 lb 6.4 oz (77.7 kg)   SpO2 94%   BMI 28.52 kg/m   Physical Exam:  Well appearing NAD HEENT: Unremarkable Neck:  No JVD, no  thyromegally Lymphatics:  No adenopathy Back:  No CVA tenderness Lungs:  Clear with no wheezes HEART:  Regular rate rhythm, no murmurs, no rubs, no clicks Abd:  soft, positive bowel sounds, no organomegally, no rebound, no guarding Ext:  2 plus pulses, no  edema, no cyanosis, no clubbing Skin:  No rashes no nodules Neuro:  CN II through XII intact, motor grossly intact  EKG - nsr with first degree AV block and AVWB. Narrow QRS.  Assess/Plan: Syncope - he has not had any. He has conduction system disease and would recommend avoiding AV nodal blocking drugs. I suspect that he will ultimately require a PPM but no indication presently as he is asymptomatic.  Heart block - he has conduction system disease. He will undergo watchful waiting.  Carleene Overlie Layloni Fahrner,MD

## 2022-11-16 NOTE — Telephone Encounter (Signed)
[facility]) called stating pt is RSing [place] visit for next week due to him having another appt today and it being a little too much for him.

## 2022-11-19 ENCOUNTER — Other Ambulatory Visit (HOSPITAL_COMMUNITY): Payer: Self-pay

## 2022-11-19 NOTE — Telephone Encounter (Signed)
Noted.   FYI to provider  

## 2022-11-19 NOTE — Progress Notes (Deleted)
[facility] [address] [place]. [place] [address] [phone]       HOSPITAL FOLLOW UP NOTE  Andre Stout Date of Birth:  01-22-1940 Medical Record Number:  [id number]   Reason for Referral:  hospital stroke follow up    SUBJECTIVE:   CHIEF COMPLAINT:  No chief complaint on file.   HPI:   Andre Stout is a 82 y.o. male with PMH significant for HTN, prior complete heart block, prostrate cancer, hx of glaucoma who presented on 10/15/2022 with sudden onset R sided weakness.  Personally reviewed hospitalization pertinent progress notes, lab work and imaging.  Evaluated by Dr. Erlinda Hong for punctate left frontal MCA infarcts, embolic pattern concerning for cardioembolic source.  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  Carotid Dopplers unremarkable.  EF 60 to 65%.  LE Doppler negative for DVT.  LDL 104.  A1c 8.4.  Recommended DAPT for 3 weeks then Plavix alone as on aspirin PTA as well as initiated atorvastatin 40 mg daily.  Follows with Dr. Lovena Le outpatient for third-degree heart block, recommended cardiac monitor outpatient and follow-up with Dr. Lovena Le to consider pacemaker.  PT/OT recommended home health.        PERTINENT IMAGING  Per hospitalization 10/15/2022 - *** Code Stroke CT head No acute abnormality. ASPECTS 10.    MRI  No acute intracranial abnormality. R CPA unchanged meningioma Repeat MRI shows small punctate acute infarcts in L frontal lobe MRA No large vessel occlusion and generally mild for age intracranial atherosclerosis, but there is isolated mild to moderate stenosis in the distal 3rd Basilar artery. Carotid Doppler  carotids have no significant stenosis 2D Echo EF 60-65% LE venous Doppler negative for DVT LDL 104 HgbA1c 8.4    ROS:   14 system review of systems performed and negative with exception of ***  PMH:  Past Medical  History:  Diagnosis Date   Diabetes mellitus without complication ([place])    Glaucoma    Heart block    complete heart block   Hypertension    Prostate cancer ([place])    Been 3-4 years ago    PSH:  Past Surgical History:  Procedure Laterality Date   CATARACT EXTRACTION Bilateral    INSERTION PROSTATE RADIATION SEED     IR PERC CHOLECYSTOSTOMY  08/17/2022   IR RADIOLOGIST EVAL & MGMT  09/28/2022   IR RADIOLOGIST EVAL & MGMT  10/10/2022    Social History:  Social History   Socioeconomic History   Marital status: Widowed    Spouse name: Not on file   Number of children: 1   Years of education: 37   Highest education level: Not on file  Occupational History   Occupation: Retired  Tobacco Use   Smoking status: Former    Packs/day: 0.50    Years: 4.00    Total pack years: 2.00    Types: Cigarettes    Quit date: 10/29/1974    Years since quitting: 48.0   Smokeless tobacco: Never  Vaping Use   Vaping Use: Never used  Substance and Sexual Activity   Alcohol use: No   Drug use: No   Sexual activity: Not Currently  Other Topics Concern   Not on file  Social History Narrative   Born and raised in [place], [place]. Currently reside in a private residence by himself. Daughter lives in the area. No live. Fun: hunt and fish  Denies religious beliefs that would effect health care.    Social Determinants of Health   Financial Resource Strain: Low Risk  (03/26/2022)   Overall Financial Resource Strain (CARDIA)    Difficulty of Paying Living Expenses: Not hard at all  Food Insecurity: No Food Insecurity (10/16/2022)   Hunger Vital Sign    Worried About Running Out of Food in the Last Year: Never true    Ran Out of Food in the Last Year: Never true  Transportation Needs: No Transportation Needs (10/16/2022)   PRAPARE - Hydrologist (Medical): No    Lack of Transportation (Non-Medical): No  Physical Activity: Inactive (03/26/2022)   Exercise Vital Sign     Days of Exercise per Week: 0 days    Minutes of Exercise per Session: 0 min  Stress: No Stress Concern Present (03/26/2022)   [place]    Feeling of Stress : Not at all  Social Connections: Unknown (05/28/2019)   Social Connection and Isolation Panel [NHANES]    Frequency of Communication with Friends and Family: More than three times a week    Frequency of Social Gatherings with Friends and Family: More than three times a week    Attends Religious Services: Not on file    Active Member of Clubs or Organizations: Yes    Attends Archivist Meetings: More than 4 times per year    Marital Status: Widowed  Intimate Partner Violence: Not At Risk (10/16/2022)   Humiliation, Afraid, Rape, and Kick questionnaire    Fear of Current or Ex-Partner: No    Emotionally Abused: No    Physically Abused: No    Sexually Abused: No    Family History:  Family History  Problem Relation Age of Onset   Diabetes Father    Diabetes Paternal Grandfather     Medications:   Current Outpatient Medications on File Prior to Visit  Medication Sig Dispense Refill   acetaminophen (TYLENOL) 500 MG tablet Take 1,000 mg by mouth daily as needed (pain).     amLODipine (NORVASC) 10 MG tablet Take 1 tablet (10 mg total) by mouth daily. 90 tablet 3   atorvastatin (LIPITOR) 40 MG tablet Take 1 tablet (40 mg total) by mouth daily. 30 tablet 2   BD INSULIN SYRINGE U/F 31G X 5/16" 1 ML MISC USE 2 DAILY 200 each 4   benzonatate (TESSALON) 100 MG capsule Take 1 capsule (100 mg total) by mouth 3 (three) times daily as needed. 20 capsule 0   Blood Glucose Monitoring Suppl (ACCU-CHEK GUIDE) w/Device KIT Use As Directed 1 kit 0   brimonidine (ALPHAGAN) 0.2 % ophthalmic solution Place 1 drop into both eyes 2 (two) times daily with breakfast and lunch.     clopidogrel (PLAVIX) 75 MG tablet Take 1 tablet (75 mg total) by mouth daily. 30 tablet 2    dorzolamide-timolol (COSOPT) 22.3-6.8 MG/ML ophthalmic solution Place 1 drop into both eyes 2 (two) times daily with breakfast and lunch.     insulin NPH-regular Human (70-30) 100 UNIT/ML injection Inject 20 Units into the skin 2 (two) times daily with a meal. 10 mL 11   latanoprost (XALATAN) 0.005 % ophthalmic solution Place 1 drop into both eyes at bedtime.     lisinopril (ZESTRIL) 20 MG tablet Take 1 tablet (20 mg total) by mouth daily. 90 tablet 3   magnesium oxide (MAG-OX) 400 (240 Mg) MG tablet Take 1 tablet (400  mg total) by mouth 2 (two) times daily. 60 tablet 11   Multiple Vitamins-Minerals (PRESERVISION AREDS 2) CAPS Take 1 capsule by mouth at bedtime.     ONETOUCH DELICA LANCETS 44Y MISC Use to check blood sugar 4 times per day. Dx code: E11.9 200 each 2   ONETOUCH ULTRA test strip USE TO MONITOR GLUCOSE LEVELS 4 TIMES PER DAY E11.9 100 each 2   pantoprazole (PROTONIX) 40 MG tablet Take 1 tablet (40 mg total) by mouth 2 (two) times daily before a meal. 60 tablet 2   sodium chloride flush 0.9 % SOLN injection Inject 5 mLs into the vein as needed for up to 30 doses. Discard remainder of syringe. (Patient taking differently: Inject 5 mLs into the vein daily.) 300 mL 0   No current facility-administered medications on file prior to visit.    Allergies:  No Known Allergies    OBJECTIVE:  Physical Exam  There were no vitals filed for this visit. There is no height or weight on file to calculate BMI. No results found.     10/23/2022   11:12 AM  Depression screen PHQ 2/9  Decreased Interest 0  Down, Depressed, Hopeless 0  PHQ - 2 Score 0     General: well developed, well nourished, seated, in no evident distress Head: head normocephalic and atraumatic.   Neck: supple with no carotid or supraclavicular bruits Cardiovascular: regular rate and rhythm, no murmurs Musculoskeletal: no deformity Skin:  no rash/petichiae Vascular:  Normal pulses all extremities   Neurologic  Exam Mental Status: Awake and fully alert. Oriented to place and time. Recent and remote memory intact. Attention span, concentration and fund of knowledge appropriate. Mood and affect appropriate.  Cranial Nerves: Fundoscopic exam reveals sharp disc margins. Pupils equal, briskly reactive to light. Extraocular movements full without nystagmus. Visual fields full to confrontation. Hearing intact. Facial sensation intact. Face, tongue, palate moves normally and symmetrically.  Motor: Normal bulk and tone. Normal strength in all tested extremity muscles Sensory.: intact to touch , pinprick , position and vibratory sensation.  Coordination: Rapid alternating movements normal in all extremities. Finger-to-nose and heel-to-shin performed accurately bilaterally. Gait and Station: Arises from chair without difficulty. Stance is normal. Gait demonstrates normal stride length and balance with ***. Tandem walk and heel toe ***.  Reflexes: 1+ and symmetric. Toes downgoing.     NIHSS  *** Modified [place]  ***      ASSESSMENT: Andre Stout is a 82 y.o. year old male with punctate left frontal MCA infarcts on 18/56/3149 embolic pattern secondary to unknown source. Vascular risk factors include HTN, HLD, DM, third-degree heart block and advanced age.      PLAN:  Cryptogenic stroke:  Residual deficit: ***.  Continue {anticoagluation:28091} and {statin:28096} for secondary stroke prevention.   Discussed secondary stroke prevention measures and importance of close PCP follow up for aggressive stroke risk factor management including BP goal<130/90, HLD with LDL goal<70 and DM with A1c.<7 .  Stroke labs 09/2022: LDL 104, A1c 8.4 I have gone over the pathophysiology of stroke, warning signs and symptoms, risk factors and their management in some detail with instructions to go to the closest emergency room for symptoms of concern.     Follow up in *** or call earlier if needed   CC:  [facility] provider:  Dr. Leonie Man PCP: [name], FNP    I spent *** minutes of face-to-face and non-face-to-face time with patient.  This included previsit chart review including review  of recent hospitalization, lab review, study review, order entry, electronic health record documentation, patient education regarding recent stroke including etiology, secondary stroke prevention measures and importance of managing stroke risk factors, residual deficits and typical recovery time and answered all other questions to patient satisfaction   Frann Rider, AGNP-BC  [facility] [address] [place], [place] [address]  Phone [phone] Fax [phone] Note: This document was prepared with digital dictation and possible smart phrase technology. Any transcriptional errors that result from this process are unintentional.

## 2022-11-20 ENCOUNTER — Inpatient Hospital Stay: Payer: Medicare Other | Admitting: Adult Health

## 2022-11-21 ENCOUNTER — Other Ambulatory Visit: Payer: Self-pay

## 2022-11-21 ENCOUNTER — Telehealth: Payer: Self-pay | Admitting: Internal Medicine

## 2022-11-21 MED ORDER — "INSULIN SYRINGE-NEEDLE U-100 31G X 5/16"" 1 ML MISC": 4 refills | Status: DC

## 2022-11-21 MED ORDER — INSULIN NPH ISOPHANE & REGULAR (70-30) 100 UNIT/ML ~~LOC~~ SUSP: 20.0000 [IU] | Freq: Two times a day (BID) | SUBCUTANEOUS | 0 refills | Status: DC

## 2022-11-21 NOTE — Telephone Encounter (Signed)
MEDICATION: Insulin 70/30 and BD Syringes  PHARMACY:  [facility] [address]  HAS THE PATIENT CONTACTED THEIR PHARMACY?  YES  IS THIS A 90 DAY SUPPLY : YES  IS PATIENT OUT OF MEDICATION: NO   IF NOT; HOW MUCH IS LEFT: 1-2 Days  LAST APPOINTMENT DATE: '@11'$ /13/2023  NEXT APPOINTMENT DATE:'@1'$ /23/2024  DO WE HAVE YOUR PERMISSION TO LEAVE A DETAILED MESSAGE?: yes  OTHER COMMENTS:    **Let patient know to contact pharmacy at the end of the day to make sure medication is ready. **  ** Please notify patient to allow 48-72 hours to process**  **Encourage patient to contact the pharmacy for refills or they can request refills through [facility]**

## 2022-11-21 NOTE — Telephone Encounter (Signed)
DONE

## 2022-11-27 ENCOUNTER — Inpatient Hospital Stay: Payer: Medicare Other | Admitting: Adult Health

## 2022-11-29 ENCOUNTER — Encounter: Payer: Self-pay | Admitting: Neurology

## 2022-11-29 ENCOUNTER — Inpatient Hospital Stay: Payer: Medicare Other | Admitting: Neurology

## 2022-11-29 NOTE — Progress Notes (Deleted)
Patient: Andre Stout Date of Birth: 25-May-1940  Reason for Visit: Follow up History from: Patient Primary Neurologist:    ASSESSMENT AND PLAN 82 y.o. year old male   11.  Stroke-punctate left frontal MCA infarcts, embolic pattern, concerning for cardioembolic source -Finished 3 weeks DAPT, now Plavix 75 mg daily alone  2.  Third-degree heart block  3.  Hypertension -BP goal less than 130/90 -On Norvasc, lisinopril  4.  Hyperlipidemia -LDL 104, goal less than 70 -Started Lipitor 40  5.  Type 2 diabetes -A1c 8.4, goal less than 7.0  6.  Glaucoma   HISTORY OF PRESENT ILLNESS: Today 11/29/22 Andre Stout presents today for stroke clinic follow-up.  Presented to the ER 10/15/2022 with sudden onset right-sided weakness.  MRI showed small punctate acute infarcts in left frontal lobe.  Concerning for cardioembolic source.  He completed 2-week cardiac monitor, but has not turned back in.  Had outpatient follow-up with Dr. Lovena Le 11/16/2022.  Felt ultimately will require pacemaker, but holding for now.  -CT head no acute abnormality -MRI no acute abnormality.  Unchanged right CP angle meningioma. -Repeat MRI of the brain showed small punctate acute infarcts in left frontal lobe -MRA no LVO and generally mild for age intracranial atherosclerosis, but there is isolated mild to moderate stenosis in the distal third basilar artery -Carotid Doppler, carotids with no significant stenosis -2D echo EF 60 to 65% -Lower extremity venous Doppler negative for DVT -LDL 104 -A1c 8.4 -Aspirin 81 mg daily prior to admission, now aspirin 81 mg daily and Plavix 75 mg daily for 3 weeks then Plavix alone after -Cardiology placed 14-day cardiac monitor, follow-up with Dr. Lovena Le 11/17 to discuss pacemaker  HISTORY  Dr. Lorrin Goodell 10/15/22: Andre Stout is a 82 y.o. male with PMH significant for HTN, prior complete heart block, prostrate cancer, hx of glaucoma who developed sudden onset R  sided weakness with a [place] of 2215.   Daughter talked to him and left and he was fine and around 2300 was messing around with his dentures. He called for his daughter and he was unable to move his R arm and reported R arm numbness.   His daughter called EMS and he was noted to have no movement in RUE and RLE and R facial droop. His symptoms resolved about 10 mins prio to arrival to the ED.   On screening Neuro exam, he was found to have R upper quadrantanopsia. He is unsure of when the vision deficit started. He reports not being aware of the vision deficit prior to my evaluation.   He has a hx of macular degeneration and glaucoma.   LKW: 2215 on 10/14/22. mRS: 2 tNKASE: not offered, no stroke on MRI Thrombectomy: not offered, no stroke on MRI.  REVIEW OF SYSTEMS: Out of a complete 14 system review of symptoms, the patient complains only of the following symptoms, and all other reviewed systems are negative.  See HPI  ALLERGIES: No Known Allergies  HOME MEDICATIONS: Outpatient Medications Prior to Visit  Medication Sig Dispense Refill   acetaminophen (TYLENOL) 500 MG tablet Take 1,000 mg by mouth daily as needed (pain).     amLODipine (NORVASC) 10 MG tablet Take 1 tablet (10 mg total) by mouth daily. 90 tablet 3   atorvastatin (LIPITOR) 40 MG tablet Take 1 tablet (40 mg total) by mouth daily. 30 tablet 2   benzonatate (TESSALON) 100 MG capsule Take 1 capsule (100 mg total) by mouth 3 (three) times  daily as needed. 20 capsule 0   Blood Glucose Monitoring Suppl (ACCU-CHEK GUIDE) w/Device KIT Use As Directed 1 kit 0   brimonidine (ALPHAGAN) 0.2 % ophthalmic solution Place 1 drop into both eyes 2 (two) times daily with breakfast and lunch.     clopidogrel (PLAVIX) 75 MG tablet Take 1 tablet (75 mg total) by mouth daily. 30 tablet 2   dorzolamide-timolol (COSOPT) 22.3-6.8 MG/ML ophthalmic solution Place 1 drop into both eyes 2 (two) times daily with breakfast and lunch.     insulin  NPH-regular Human (70-30) 100 UNIT/ML injection Inject 20 Units into the skin 2 (two) times daily with a meal. 30 mL 0   Insulin Syringe-Needle U-100 (BD INSULIN SYRINGE U/F) 31G X 5/16" 1 ML MISC USE 2 DAILY 200 each 4   latanoprost (XALATAN) 0.005 % ophthalmic solution Place 1 drop into both eyes at bedtime.     lisinopril (ZESTRIL) 20 MG tablet Take 1 tablet (20 mg total) by mouth daily. 90 tablet 3   magnesium oxide (MAG-OX) 400 (240 Mg) MG tablet Take 1 tablet (400 mg total) by mouth 2 (two) times daily. 60 tablet 11   Multiple Vitamins-Minerals (PRESERVISION AREDS 2) CAPS Take 1 capsule by mouth at bedtime.     ONETOUCH DELICA LANCETS 53Z MISC Use to check blood sugar 4 times per day. Dx code: E11.9 200 each 2   ONETOUCH ULTRA test strip USE TO MONITOR GLUCOSE LEVELS 4 TIMES PER DAY E11.9 100 each 2   pantoprazole (PROTONIX) 40 MG tablet Take 1 tablet (40 mg total) by mouth 2 (two) times daily before a meal. 60 tablet 2   sodium chloride flush 0.9 % SOLN injection Inject 5 mLs into the vein as needed for up to 30 doses. Discard remainder of syringe. (Patient taking differently: Inject 5 mLs into the vein daily.) 300 mL 0   No facility-administered medications prior to visit.    PAST MEDICAL HISTORY: Past Medical History:  Diagnosis Date   Diabetes mellitus without complication ([place])    Glaucoma    Heart block    complete heart block   Hypertension    Prostate cancer ([place])    Been 3-4 years ago    PAST SURGICAL HISTORY: Past Surgical History:  Procedure Laterality Date   CATARACT EXTRACTION Bilateral    INSERTION PROSTATE RADIATION SEED     IR PERC CHOLECYSTOSTOMY  08/17/2022   IR RADIOLOGIST EVAL & MGMT  09/28/2022   IR RADIOLOGIST EVAL & MGMT  10/10/2022    FAMILY HISTORY: Family History  Problem Relation Age of Onset   Diabetes Father    Diabetes Paternal Grandfather     SOCIAL HISTORY: Social History   Socioeconomic History   Marital status: Widowed    Spouse  name: Not on file   Number of children: 1   Years of education: 29   Highest education level: Not on file  Occupational History   Occupation: Retired  Tobacco Use   Smoking status: Former    Packs/day: 0.50    Years: 4.00    Total pack years: 2.00    Types: Cigarettes    Quit date: 10/29/1974    Years since quitting: 48.1   Smokeless tobacco: Never  Vaping Use   Vaping Use: Never used  Substance and Sexual Activity   Alcohol use: No   Drug use: No   Sexual activity: Not Currently  Other Topics Concern   Not on file  Social History Narrative   Born  and raised in [place], [place]. Currently reside in a private residence by himself. Daughter lives in the area. No live. Fun: hunt and fish   Denies religious beliefs that would effect health care.    Social Determinants of Health   Financial Resource Strain: Low Risk  (03/26/2022)   Overall Financial Resource Strain (CARDIA)    Difficulty of Paying Living Expenses: Not hard at all  Food Insecurity: No Food Insecurity (10/16/2022)   Hunger Vital Sign    Worried About Running Out of Food in the Last Year: Never true    Ran Out of Food in the Last Year: Never true  Transportation Needs: No Transportation Needs (10/16/2022)   PRAPARE - Hydrologist (Medical): No    Lack of Transportation (Non-Medical): No  Physical Activity: Inactive (03/26/2022)   Exercise Vital Sign    Days of Exercise per Week: 0 days    Minutes of Exercise per Session: 0 min  Stress: No Stress Concern Present (03/26/2022)   [place]    Feeling of Stress : Not at all  Social Connections: Unknown (05/28/2019)   Social Connection and Isolation Panel [NHANES]    Frequency of Communication with Friends and Family: More than three times a week    Frequency of Social Gatherings with Friends and Family: More than three times a week    Attends Religious Services: Not on file     Active Member of Clubs or Organizations: Yes    Attends Archivist Meetings: More than 4 times per year    Marital Status: Widowed  Intimate Partner Violence: Not At Risk (10/16/2022)   Humiliation, Afraid, Rape, and Kick questionnaire    Fear of Current or Ex-Partner: No    Emotionally Abused: No    Physically Abused: No    Sexually Abused: No    PHYSICAL EXAM  There were no vitals filed for this visit. There is no height or weight on file to calculate BMI.  Generalized: Well developed, in no acute distress  Neurological examination  Mentation: Alert oriented to time, place, history taking. Follows all commands speech and language fluent Cranial nerve II-XII: Pupils were equal round reactive to light. Extraocular movements were full, visual field were full on confrontational test. Facial sensation and strength were normal. Uvula tongue midline. Head turning and shoulder shrug  were normal and symmetric. Motor: The motor testing reveals 5 over 5 strength of all 4 extremities. Good symmetric motor tone is noted throughout.  Sensory: Sensory testing is intact to soft touch on all 4 extremities. No evidence of extinction is noted.  Coordination: Cerebellar testing reveals good finger-nose-finger and heel-to-shin bilaterally.  Gait and station: Gait is normal. Tandem gait is normal. Romberg is negative. No drift is seen.  Reflexes: Deep tendon reflexes are symmetric and normal bilaterally.   DIAGNOSTIC DATA (LABS, IMAGING, TESTING) - I reviewed patient records, labs, notes, testing and imaging myself where available.  Lab Results  Component Value Date   WBC 11.1 (H) 10/16/2022   HGB 13.7 10/16/2022   HCT 42.7 10/16/2022   MCV 83.2 10/16/2022   PLT 256 10/16/2022      Component Value Date/Time   NA 138 10/16/2022 0546   K 4.2 10/16/2022 0546   CL 104 10/16/2022 0546   CO2 20 (L) 10/16/2022 0546   GLUCOSE 202 (H) 10/16/2022 0546   BUN 12 10/16/2022 0546    CREATININE 1.04 10/16/2022 0546  CREATININE 1.11 01/12/2022 1541   CALCIUM 9.8 10/16/2022 0546   PROT 6.7 10/15/2022 0026   ALBUMIN 3.4 (L) 10/15/2022 0026   AST 13 (L) 10/15/2022 0026   ALT 10 10/15/2022 0026   ALKPHOS 79 10/15/2022 0026   BILITOT 1.0 10/15/2022 0026   GFRNONAA >60 10/16/2022 0546   GFRAA >60 03/07/2016 0337   Lab Results  Component Value Date   CHOL 111 10/15/2022   HDL 46 10/15/2022   LDLCALC 38 10/15/2022   LDLDIRECT 121.0 03/25/2019   TRIG 134 10/15/2022   CHOLHDL 2.4 10/15/2022   Lab Results  Component Value Date   HGBA1C 8.4 (H) 10/15/2022   No results found for: "VITAMINB12" Lab Results  Component Value Date   TSH 4.012 08/02/2022    Butler Denmark, AGNP-C, DNP 11/29/2022, 5:24 AM [facility] [address], [place], [place] [address] [phone]

## 2022-12-07 ENCOUNTER — Other Ambulatory Visit: Payer: Self-pay | Admitting: Family

## 2022-12-07 ENCOUNTER — Telehealth: Payer: Self-pay | Admitting: Family

## 2022-12-07 MED ORDER — MAGNESIUM OXIDE -MG SUPPLEMENT 400 (240 MG) MG PO TABS: 400.0000 mg | ORAL_TABLET | Freq: Two times a day (BID) | ORAL | 11 refills | Status: DC

## 2022-12-07 NOTE — Telephone Encounter (Signed)
Okay to refill? Last Mag check was 1.5 on 10/16/22.

## 2022-12-07 NOTE — Telephone Encounter (Signed)
Prescription Request  12/07/2022  Is this a "Controlled Substance" medicine? No  LOV: 08/30/2022  What is the name of the medication or equipment?   magnesium oxide (MAG-OX) 400 (240 Mg) MG tablet [[id number]]   Have you contacted your pharmacy to request a refill? Yes   Which pharmacy would you like this sent to?   [id number]Phone: [phone]: [phone] Patient notified that their request is being sent to the clinical staff for review and that they should receive a response within 2 business days.   Please advise at Mobile [phone](mobile)

## 2022-12-15 ENCOUNTER — Other Ambulatory Visit: Payer: Self-pay | Admitting: Family

## 2022-12-29 ENCOUNTER — Encounter: Payer: Self-pay | Admitting: Physician Assistant

## 2022-12-29 ENCOUNTER — Telehealth: Payer: Medicare Other | Admitting: Physician Assistant

## 2022-12-29 NOTE — Progress Notes (Unsigned)
Despite multiple contact attempts, including messaging/emailing of link to virtual urgent care video, patient did not show for appointment.

## 2023-01-07 ENCOUNTER — Other Ambulatory Visit: Payer: Self-pay | Admitting: Family

## 2023-01-18 ENCOUNTER — Telehealth: Payer: Self-pay | Admitting: Family

## 2023-01-18 NOTE — Telephone Encounter (Signed)
Prescription Request  01/18/2023  Is this a "Controlled Substance" medicine? No  LOV: 08/30/2022  What is the name of the medication or equipment?   atorvastatin (LIPITOR) 40 MG tablet [[id number]]   Have you contacted your pharmacy to request a refill? Yes   Which pharmacy would you like this sent to?   [id number]Phone: [phone]: [phone] Patient notified that their request is being sent to the clinical staff for review and that they should receive a response within 2 business days.   Please advise at Mobile [phone](mobile)

## 2023-01-21 MED ORDER — ATORVASTATIN CALCIUM 40 MG PO TABS: 40.0000 mg | ORAL_TABLET | Freq: Every day | ORAL | 2 refills | Status: DC

## 2023-01-21 NOTE — Telephone Encounter (Signed)
Left pt message that rx has been sent in.

## 2023-01-22 ENCOUNTER — Encounter: Payer: Self-pay | Admitting: Internal Medicine

## 2023-01-22 ENCOUNTER — Ambulatory Visit (INDEPENDENT_AMBULATORY_CARE_PROVIDER_SITE_OTHER): Payer: Medicare Other | Admitting: Internal Medicine

## 2023-01-22 VITALS — BP 128/80 | HR 66 | Ht 65.0 in | Wt 166.2 lb

## 2023-01-22 DIAGNOSIS — E1159 Type 2 diabetes mellitus with other circulatory complications: Secondary | ICD-10-CM | POA: Diagnosis not present

## 2023-01-22 DIAGNOSIS — E7849 Other hyperlipidemia: Secondary | ICD-10-CM

## 2023-01-22 DIAGNOSIS — E1165 Type 2 diabetes mellitus with hyperglycemia: Secondary | ICD-10-CM

## 2023-01-22 LAB — POCT GLYCOSYLATED HEMOGLOBIN (HGB A1C): Hemoglobin A1C: 9 % — AB (ref 4.0–5.6)

## 2023-01-22 MED ORDER — INSULIN NPH ISOPHANE & REGULAR (70-30) 100 UNIT/ML ~~LOC~~ SUSP: SUBCUTANEOUS | 3 refills | Status: DC

## 2023-01-22 MED ORDER — FREESTYLE LIBRE 3 READER DEVI: 1.0000 | Freq: Every day | 0 refills | Status: DC

## 2023-01-22 MED ORDER — FREESTYLE LIBRE 3 SENSOR MISC: 1.0000 | 3 refills | Status: DC

## 2023-01-22 NOTE — Patient Instructions (Addendum)
Please continue NovoLog 70/30 insulin: - 20 units in a.m.  - 15 units in p.m.   Take the insulin 10-15 min before meals.  Try to stop fruit cups. Replace these with unsalted nuts, veggies, fruit.  Try to get the [facility] 3.  PATIENT INSTRUCTIONS FOR TYPE 2 DIABETES:  DIET AND EXERCISE Diet and exercise is an important part of diabetic treatment.  We recommended aerobic exercise in the form of brisk walking (working between 40-60% of maximal aerobic capacity, similar to brisk walking) for 150 minutes per week (such as 30 minutes five days per week) along with 3 times per week performing 'resistance' training (using various gauge rubber tubes with handles) 5-10 exercises involving the major muscle groups (upper body, lower body and core) performing 10-15 repetitions (or near fatigue) each exercise. Start at half the above goal but build slowly to reach the above goals. If limited by weight, joint pain, or disability, we recommend daily walking in a swimming pool with water up to waist to reduce pressure from joints while allow for adequate exercise.    BLOOD GLUCOSES Monitoring your blood glucoses is important for continued management of your diabetes. Please check your blood glucoses 2-4 times a day: fasting, before meals and at bedtime (you can rotate these measurements - e.g. one day check before the 3 meals, the next day check before 2 of the meals and before bedtime, etc.).   HYPOGLYCEMIA (low blood sugar) Hypoglycemia is usually a reaction to not eating, exercising, or taking too much insulin/ other diabetes drugs.  Symptoms include tremors, sweating, hunger, confusion, headache, etc. Treat IMMEDIATELY with 15 grams of Carbs: 4 glucose tablets  cup regular juice/soda 2 tablespoons raisins 4 teaspoons sugar 1 tablespoon honey Recheck blood glucose in 15 mins and repeat above if still symptomatic/blood glucose <100.  RECOMMENDATIONS TO REDUCE YOUR RISK OF DIABETIC  COMPLICATIONS: * Take your prescribed MEDICATION(S) * Follow a DIABETIC diet: Complex carbs, fiber rich foods, (monounsaturated and polyunsaturated) fats * AVOID saturated/trans fats, high fat foods, >2,300 mg salt per day. * EXERCISE at least 5 times a week for 30 minutes or preferably daily.  * DO NOT SMOKE OR DRINK more than 1 drink a day. * Check your FEET every day. Do not wear tightfitting shoes. Contact us if you develop an ulcer * See your EYE doctor once a year or more if needed * Get a FLU shot once a year * Get a PNEUMONIA vaccine once before and once after age 81 years  GOALS:  * Your Hemoglobin A1c of <7%  * fasting sugars need to be 80-130 * after meals sugars need to be <180 (2h after you start eating) * Your Systolic BP should be 062 or lower  * Your Diastolic BP should be 80 or lower  * Your HDL (Good Cholesterol) should be 40 or higher  * Your LDL (Bad Cholesterol) should be ideally <70. * Your Triglycerides should be 150 or lower  * Your Urine microalbumin (kidney function) should be <30 * Your Body Mass Index should be 25 or lower   Please consider the following ways to cut down carbs and fat and increase fiber and micronutrients in your diet: - substitute whole grain for white bread or pasta - substitute brown rice for white rice - substitute 90-calorie flat bread pieces for slices of bread when possible - substitute sweet potatoes or yams for white potatoes - substitute humus for margarine - substitute tofu for cheese when possible -  substitute almond or rice milk for regular milk (would not drink soy milk daily due to concern for soy estrogen influence on breast cancer risk) - substitute dark chocolate for other sweets when possible - substitute water - can add lemon or orange slices for taste - for diet sodas (artificial sweeteners will trick your body that you can eat sweets without getting calories and will lead you to overeating and weight gain in the long  run) - do not skip breakfast or other meals (this will slow down the metabolism and will result in more weight gain over time)  - can try smoothies made from fruit and almond/rice milk in am instead of regular breakfast - can also try old-fashioned (not instant) oatmeal made with almond/rice milk in am - order the dressing on the side when eating salad at a restaurant (pour less than half of the dressing on the salad) - eat as little meat as possible - can try juicing, but should not forget that juicing will get rid of the fiber, so would alternate with eating raw veg./fruits or drinking smoothies - use as little oil as possible, even when using olive oil - can dress a salad with a mix of balsamic vinegar and lemon juice, for e.g. - use agave nectar, stevia sugar, or regular sugar rather than artificial sweateners - steam or broil/roast veggies  - snack on veggies/fruit/nuts (unsalted, preferably) when possible, rather than processed foods - reduce or eliminate aspartame in diet (it is in diet sodas, chewing gum, etc) Read the labels!  Try to read Dr. Janene Harvey book: "Program for Reversing Diabetes" for other ideas for healthy eating.

## 2023-01-22 NOTE — Progress Notes (Signed)
Patient ID: Andre Stout, male   DOB: 1940/10/19, 83 y.o.   MRN: [id number]  HPI: Andre Stout is a 83 y.o.-year-old male, returning for follow-up for DM2, dx in 1990, insulin-dependent since 2006, uncontrolled, with complications (CVA, CKD, DR, DKA, hypoglycemia). Pt. previously saw Dr. Loanne Drilling, last visit 10 months ago.  He is here with his daughter who offers most of the history, regarding past medical history, insulin injections (she administers them), and blood sugars.  Reviewed HbA1c: Lab Results  Component Value Date   HGBA1C 8.4 (H) 10/15/2022   HGBA1C 8.3 (H) 08/02/2022   HGBA1C 8.9 (A) 02/07/2022   HGBA1C 8.2 (H) 01/03/2022   HGBA1C 8.8 (A) 11/17/2021   HGBA1C 10.5 (A) 09/15/2021   HGBA1C 9.4 (A) 06/23/2021   HGBA1C 9.8 (A) 03/17/2021   HGBA1C 9.2 (H) 02/20/2021   HGBA1C 8.3 (A) 11/15/2020   Pt is on a regimen of: - Novolog Insulin 70/30 (vials) - 10 min before 30 >> 20 units in a.m.(8 am) 42 >> 20 units in p.m. (5 pm)  Pt checks his sugars 4-6x a day and they are: - am: 90-100 or, if eating at night: 200 - 2h after b'fast: 130 if no eating at night - before lunch: 90-120 - 2h after lunch: n/c - before dinner: 200 - 2h after dinner: <200 - bedtime: 100-180 - nighttime: 80-140 Lowest sugar was 75; he has hypoglycemia awareness at 70.  Highest sugar was 300s.  Glucometer: One Touch  - + CKD, last BUN/creatinine:  Lab Results  Component Value Date   BUN 12 10/16/2022   BUN 22 10/15/2022   CREATININE 1.04 10/16/2022   CREATININE 1.50 (H) 10/15/2022  On lisinopril 20 mg daily.  -+ HL; last set of lipids: Lab Results  Component Value Date   CHOL 111 10/15/2022   HDL 46 10/15/2022   LDLCALC 38 10/15/2022   LDLDIRECT 121.0 03/25/2019   TRIG 134 10/15/2022   CHOLHDL 2.4 10/15/2022  On Lipitor 40 mg daily.  - last eye exam was in 2023. + DR. + glaucoma. She has a retina specialist also (Dr. Zigmund Daniel).  - no numbness and tingling in his feet.  Last foot  exam 09/15/2021. Has a podiatrist  - Dr. Sherryle Lis.  He also has a history of complete heart block, prostate cancer, acute cholecystitis 08/2022.  ROS: + see HPI No increased urination, blurry vision, nausea, chest pain.  Past Medical History:  Diagnosis Date   Diabetes mellitus without complication ([place])    Glaucoma    Heart block    complete heart block   Hypertension    Prostate cancer ([place])    Been 3-4 years ago   Past Surgical History:  Procedure Laterality Date   CATARACT EXTRACTION Bilateral    INSERTION PROSTATE RADIATION SEED     IR PERC CHOLECYSTOSTOMY  08/17/2022   IR RADIOLOGIST EVAL & MGMT  09/28/2022   IR RADIOLOGIST EVAL & MGMT  10/10/2022   Social History   Socioeconomic History   Marital status: Widowed    Spouse name: Not on file   Number of children: 1   Years of education: 61   Highest education level: Not on file  Occupational History   Occupation: Retired  Tobacco Use   Smoking status: Former    Packs/day: 0.50    Years: 4.00    Total pack years: 2.00    Types: Cigarettes    Quit date: 10/29/1974    Years since quitting: 48.2   Smokeless  tobacco: Never  Vaping Use   Vaping Use: Never used  Substance and Sexual Activity   Alcohol use: No   Drug use: No   Sexual activity: Not Currently  Other Topics Concern   Not on file  Social History Narrative   Born and raised in [place], [place]. Currently reside in a private residence by himself. Daughter lives in the area. No live. Fun: hunt and fish   Denies religious beliefs that would effect health care.    Social Determinants of Health   Financial Resource Strain: Low Risk  (03/26/2022)   Overall Financial Resource Strain (CARDIA)    Difficulty of Paying Living Expenses: Not hard at all  Food Insecurity: No Food Insecurity (10/16/2022)   Hunger Vital Sign    Worried About Running Out of Food in the Last Year: Never true    Ran Out of Food in the Last Year: Never true  Transportation Needs: No  Transportation Needs (10/16/2022)   PRAPARE - Hydrologist (Medical): No    Lack of Transportation (Non-Medical): No  Physical Activity: Inactive (03/26/2022)   Exercise Vital Sign    Days of Exercise per Week: 0 days    Minutes of Exercise per Session: 0 min  Stress: No Stress Concern Present (03/26/2022)   [place]    Feeling of Stress : Not at all  Social Connections: Unknown (05/28/2019)   Social Connection and Isolation Panel [NHANES]    Frequency of Communication with Friends and Family: More than three times a week    Frequency of Social Gatherings with Friends and Family: More than three times a week    Attends Religious Services: Not on file    Active Member of Clubs or Organizations: Yes    Attends Archivist Meetings: More than 4 times per year    Marital Status: Widowed  Intimate Partner Violence: Not At Risk (10/16/2022)   Humiliation, Afraid, Rape, and Kick questionnaire    Fear of Current or Ex-Partner: No    Emotionally Abused: No    Physically Abused: No    Sexually Abused: No   Current Outpatient Medications on File Prior to Visit  Medication Sig Dispense Refill   acetaminophen (TYLENOL) 500 MG tablet Take 1,000 mg by mouth daily as needed (pain).     amLODipine (NORVASC) 10 MG tablet Take 1 tablet (10 mg total) by mouth daily. 90 tablet 3   atorvastatin (LIPITOR) 40 MG tablet Take 1 tablet (40 mg total) by mouth daily. 30 tablet 2   benzonatate (TESSALON) 100 MG capsule Take 1 capsule (100 mg total) by mouth 3 (three) times daily as needed. 20 capsule 0   Blood Glucose Monitoring Suppl (ACCU-CHEK GUIDE) w/Device KIT Use As Directed 1 kit 0   brimonidine (ALPHAGAN) 0.2 % ophthalmic solution Place 1 drop into both eyes 2 (two) times daily with breakfast and lunch.     clopidogrel (PLAVIX) 75 MG tablet TAKE 1 TABLET BY MOUTH EVERY DAY 30 tablet 2    dorzolamide-timolol (COSOPT) 22.3-6.8 MG/ML ophthalmic solution Place 1 drop into both eyes 2 (two) times daily with breakfast and lunch.     insulin NPH-regular Human (70-30) 100 UNIT/ML injection Inject 20 Units into the skin 2 (two) times daily with a meal. 30 mL 0   Insulin Syringe-Needle U-100 (BD INSULIN SYRINGE U/F) 31G X 5/16" 1 ML MISC USE 2 DAILY 200 each 4   latanoprost (XALATAN) 0.005 %  ophthalmic solution Place 1 drop into both eyes at bedtime.     lisinopril (ZESTRIL) 20 MG tablet Take 1 tablet (20 mg total) by mouth daily. 90 tablet 3   magnesium oxide (MAG-OX) 400 (240 Mg) MG tablet Take 1 tablet (400 mg total) by mouth 2 (two) times daily. 60 tablet 11   Multiple Vitamins-Minerals (PRESERVISION AREDS 2) CAPS Take 1 capsule by mouth at bedtime.     ONETOUCH DELICA LANCETS 36U MISC Use to check blood sugar 4 times per day. Dx code: E11.9 200 each 2   ONETOUCH ULTRA test strip USE TO MONITOR GLUCOSE LEVELS 4 TIMES PER DAY E11.9 100 each 2   pantoprazole (PROTONIX) 40 MG tablet TAKE 1 TABLET (40 MG TOTAL) BY MOUTH TWICE A DAY BEFORE MEALS 60 tablet 2   sodium chloride flush 0.9 % SOLN injection Inject 5 mLs into the vein as needed for up to 30 doses. Discard remainder of syringe. (Patient taking differently: Inject 5 mLs into the vein daily.) 300 mL 0   No current facility-administered medications on file prior to visit.   No Known Allergies Family History  Problem Relation Age of Onset   Diabetes Father    Diabetes Paternal Grandfather     PE: BP 128/80 (BP Location: Left Arm, Patient Position: Sitting, Cuff Size: Normal)   Pulse 66   Ht '5\' 5"'$  (1.651 m)   Wt 166 lb 3.2 oz (75.4 kg)   SpO2 98%   BMI 27.66 kg/m  Wt Readings from Last 3 Encounters:  01/22/23 166 lb 3.2 oz (75.4 kg)  11/16/22 171 lb 6.4 oz (77.7 kg)  10/15/22 188 lb 4.4 oz (85.4 kg)   Constitutional: normal weight, in NAD, walks with a walker, urine bag attached Eyes:  EOMI, no exophthalmos ENT: no neck  masses, no cervical lymphadenopathy Cardiovascular: RRR, No MRG Respiratory: CTA B Musculoskeletal: no deformities Skin:no rashes Neurological: no tremor with outstretched hands  ASSESSMENT: 1. 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 HL  PLAN:  1. Patient with long-standing, uncontrolled diabetes, on premixed insulin regimen only, with poor control.  Latest HbA1c was 8.4% in 09/2022, improved.  At today's visit, HbA1c is 9.0% (lower). -He checks his blood sugars frequently.   At this visit, I suggested a CGM.  The daughter mentions that she wanted her father to be on this for a long time but this was not prescribed for him before.  We discussed that this may need to come from a supplier.  I gave them the contact information for [facility] medical supplier.  I also sent a prescription for the freestyle libre 3 CGM + receiver to [facility].  We discussed about how the sensor works, and advantages of using it especially in the forms of alarms and more consistent blood sugar checks. -Reviewing the blood sugars at home, they are at target, patient does not eat in the middle of the night, but they increase if he eats fruit cups around 1 to 2 AM.  Upon questioning, daughter noticed that his blood sugars are dropping around that time, sometimes to the 80s or even 70s.  We discussed that if he takes NPH insulin at 5 PM, it would be around 1 AM and it is conceivable that it may drop his sugars too much at that time.  For safety, I advised him to reduce the 70/30 insulin  dose in the evening.  I also advised him to try not to eat at night unless absolutely necessary.  We did discuss about correcting the low blood sugars with glucose tablets or other fast carbs. -Since daughter is giving him the injections, it would be less feasible to switch him to a basal bolus insulin regimen for  now. -I am hoping that starting CGM will help behavioral modification and help improve blood sugars, but for now, I did not suggest further dose changes besides reducing the premixed insulin dose at night.  I also advised him to make sure that they are injecting the insulin approximately 15 minutes before meals. -I also did not suggest another medication that may cause weight loss, since, per records, he lost 22 pounds in the last 3 months.  This could be related to uncontrolled blood sugars, however, reportedly, they are not so high as to justify this much weight loss. -Discussed healthier versions of snacks.  I did explain that ideally he would not snack at all, but if he had to snack, I advised him to try unsalted nuts, veggies, or certain fruit with lower glycemic index like berries, apples, pears, etc. - I suggested to:  Patient Instructions  Please continue NovoLog 70/30 insulin: - 20 units in a.m.  - 15 units in p.m.   Take the insulin 10-15 min before meals.  Try to stop fruit cups. Replace these with unsalted nuts, veggies, fruit.  Try to get the [facility] 3.  - check sugars at different times of the day - check 4x a day, rotating checks - discussed about CBG targets for treatment: 80-130 mg/dL before meals and <180 mg/dL after meals; target HbA1c <7%. - given foot care handout  - given instructions for hypoglycemia management "15-15 rule"  - advised for yearly eye exams - he is UTD - Return to clinic in 3 months  2. HL - Reviewed latest lipid panel from 09/2022: All fractions at goal: Lab Results  Component Value Date   CHOL 111 10/15/2022   HDL 46 10/15/2022   LDLCALC 38 10/15/2022   LDLDIRECT 121.0 03/25/2019   TRIG 134 10/15/2022   CHOLHDL 2.4 10/15/2022  - Continues Lipitor 40 mg daily without side effects.  - Total time spent for the visit: 40 min, in precharting, postcharting, reviewing Dr. Cordelia Pen last note, obtaining medical information from the chart and  from the pt, reviewing his  previous labs, evaluations, and treatments, inquiring about his symptoms, counseling him about his diabetes (please see the discussed topics above), and developing a plan to further treat it avoiding hypo and hyperglycemia.  Philemon Kingdom, MD PhD [facility]

## 2023-02-01 ENCOUNTER — Other Ambulatory Visit: Payer: Self-pay | Admitting: Family

## 2023-02-04 ENCOUNTER — Telehealth: Payer: Self-pay

## 2023-02-04 NOTE — Telephone Encounter (Signed)
Inbound call from DME supplier requesting form be completed and faxed with clinical notes. DME supplies ordered via [facility] through online portal.

## 2023-02-05 ENCOUNTER — Other Ambulatory Visit: Payer: Self-pay | Admitting: Internal Medicine

## 2023-02-08 ENCOUNTER — Other Ambulatory Visit: Payer: Self-pay | Admitting: Internal Medicine

## 2023-03-01 ENCOUNTER — Other Ambulatory Visit: Payer: Self-pay | Admitting: Family

## 2023-03-01 MED ORDER — BENZONATATE 100 MG PO CAPS: 100.0000 mg | ORAL_CAPSULE | Freq: Three times a day (TID) | ORAL | 0 refills | Status: DC | PRN

## 2023-03-19 ENCOUNTER — Telehealth: Payer: Self-pay | Admitting: Family

## 2023-03-19 NOTE — Telephone Encounter (Signed)
Copied from [place] [id number]. Topic: Medicare AWV >> Mar 19, 2023 11:03 AM Devoria Glassing wrote: Reason for CRM: Called patient to schedule Medicare Annual Wellness Visit (AWV). Left message for patient to call back and schedule Medicare Annual Wellness Visit (AWV).  Last date of AWV: 03/27/23  Please schedule an appointment at any time with Beatris Ship, [place] .  If any questions, please contact me.  Thank you ,  Sherol Dade; [place] Direct Dial: [phone]

## 2023-03-19 NOTE — Telephone Encounter (Signed)
Copied from [place] [phone]. Topic: Medicare AWV >> Mar 19, 2023 11:06 AM Devoria Glassing wrote: Reason for CRM: Called patient to schedule Medicare Annual Wellness Visit (AWV). No voicemail available to leave a message.  Last date of AWV: 03/27/23  Please schedule an appointment at any time with Beatris Ship, [place] .  If any questions, please contact me.  Thank you ,  Sherol Dade; [place] Direct Dial: [phone]

## 2023-03-25 ENCOUNTER — Encounter: Payer: Self-pay | Admitting: Podiatrist

## 2023-03-25 ENCOUNTER — Ambulatory Visit (INDEPENDENT_AMBULATORY_CARE_PROVIDER_SITE_OTHER): Payer: Medicare Other | Admitting: Podiatrist

## 2023-03-25 DIAGNOSIS — M79674 Pain in right toe(s): Secondary | ICD-10-CM

## 2023-03-25 DIAGNOSIS — M79675 Pain in left toe(s): Secondary | ICD-10-CM | POA: Diagnosis not present

## 2023-03-25 DIAGNOSIS — B351 Tinea unguium: Secondary | ICD-10-CM

## 2023-03-25 NOTE — Progress Notes (Signed)
Subjective: Andre Stout is a 83 y.o. male patient who presents to office today with concern of long,mildly painful nails  while ambulating in shoes; unable to trim.  Patient denies any new cramping, numbness, burning or tingling in the legs or feet.  PCP:  [name], FNP  Patient Active Problem List   Diagnosis Date Noted   TIA (transient ischemic attack) 10/15/2022   Odynophagia 08/21/2022   Hyponatremia 08/20/2022   Obesity (BMI 30-39.9) 08/20/2022   Myocardial injury 08/20/2022   DKA, type 2 ([place])    Acute cholecystitis 08/14/2022   Heart block AV complete ([place]) 07/31/2022   Diarrhea 07/31/2022   Seizure ([place]) 01/02/2022   Abnormal EKG 01/02/2022   Acute hypokalemia 01/02/2022   Diabetes mellitus without complication ([place]) [id number]   Syncope and collapse 10/09/2020   Prostate cancer ([place]) 03/25/2019   Hypophosphatemia 03/06/2016   Hypomagnesemia 03/06/2016   Influenza A 03/05/2016   Benign essential HTN 03/05/2016   AKI (acute kidney injury) ([place]) 03/04/2016   Routine general medical examination at a health care facility 05/02/2015   Medicare annual wellness visit, subsequent 05/02/2015   Current Outpatient Medications on File Prior to Visit  Medication Sig Dispense Refill   acetaminophen (TYLENOL) 500 MG tablet Take 1,000 mg by mouth daily as needed (pain).     amLODipine (NORVASC) 10 MG tablet Take 1 tablet (10 mg total) by mouth daily. 90 tablet 3   atorvastatin (LIPITOR) 40 MG tablet Take 1 tablet (40 mg total) by mouth daily. 30 tablet 2   benzonatate (TESSALON) 100 MG capsule Take 1 capsule (100 mg total) by mouth 3 (three) times daily as needed. 20 capsule 0   Blood Glucose Monitoring Suppl (ACCU-CHEK GUIDE) w/Device KIT Use As Directed 1 kit 0   brimonidine (ALPHAGAN) 0.2 % ophthalmic solution Place 1 drop into both eyes 2 (two) times daily with breakfast and lunch.     clopidogrel (PLAVIX) 75 MG tablet TAKE 1 TABLET BY MOUTH EVERY DAY 30 tablet 2    Continuous Blood Gluc Receiver (FREESTYLE LIBRE 3 READER) DEVI 1 each by Does not apply route daily. 1 each 0   Continuous Blood Gluc Sensor (FREESTYLE LIBRE 3 SENSOR) MISC 1 each by Does not apply route every 14 (fourteen) days. 6 each 3   dorzolamide-timolol (COSOPT) 22.3-6.8 MG/ML ophthalmic solution Place 1 drop into both eyes 2 (two) times daily with breakfast and lunch.     insulin NPH-regular Human (NOVOLIN 70/30) (70-30) 100 UNIT/ML injection INJECT 20 UNITS INTO THE SKIN 2 (TWO) TIMES DAILY WITH A MEAL. 10 mL 2   Insulin Syringe-Needle U-100 (BD INSULIN SYRINGE U/F) 31G X 5/16" 1 ML MISC USE 2 DAILY 200 each 4   latanoprost (XALATAN) 0.005 % ophthalmic solution Place 1 drop into both eyes at bedtime.     lisinopril (ZESTRIL) 20 MG tablet TAKE 1 TABLET BY MOUTH EVERY DAY 90 tablet 3   magnesium oxide (MAG-OX) 400 (240 Mg) MG tablet Take 1 tablet (400 mg total) by mouth 2 (two) times daily. 60 tablet 11   Multiple Vitamins-Minerals (PRESERVISION AREDS 2) CAPS Take 1 capsule by mouth at bedtime.     ONETOUCH DELICA LANCETS 99991111 MISC Use to check blood sugar 4 times per day. Dx code: E11.9 200 each 2   ONETOUCH ULTRA test strip USE TO MONITOR GLUCOSE LEVELS 4 TIMES PER DAY E11.9 100 each 2   pantoprazole (PROTONIX) 40 MG tablet TAKE 1 TABLET (40 MG TOTAL) BY MOUTH TWICE A  DAY BEFORE MEALS 60 tablet 2   sodium chloride flush 0.9 % SOLN injection Inject 5 mLs into the vein as needed for up to 30 doses. Discard remainder of syringe. (Patient taking differently: Inject 5 mLs into the vein daily.) 300 mL 0   No current facility-administered medications on file prior to visit.   No Known Allergies   Objective: General: Patient is awake, alert, and oriented x 3 and in no acute distress.  Integument: Skin is warm, dry and supple bilateral. Nails are tender, long, thickened and  dystrophic with subungual debris, consistent with onychomycosis, 1-5 bilateral. No signs of infection. No open lesions or  preulcerative lesions present bilateral. Remaining integument unremarkable.  Vasculature:  Dorsalis Pedis pulse non palpable bilateral. Posterior Tibial pulse  1/4 bilateral.  Capillary fill time <3 sec 1-5 bilateral. NO hair growth to the level of the digits. Temperature gradient within normal limits. No varicosities present bilateral. 1+ edema present bilateral.   Neurology: The patient has intact sensation measured with a 5.07/10g Semmes Weinstein Monofilament at all pedal sites bilateral . Vibratory sensation diminished bilateral with tuning fork. No Babinski sign present bilateral.   Musculoskeletal: No symptomatic pedal deformities noted bilateral. Muscular strength 5/5 in all lower extremity muscular groups bilateral without pain on range of motion . No tenderness with calf compression bilateral.  Assessment and Plan:   ICD-10-CM   1. Pain due to onychomycosis of toenails of both feet  B35.1    M79.675    M79.674         -Examined patient. -Mechanically debrided all nails 1-5 bilateral using sterile nail nipper and filed with sterile dremel without incident  -Answered all patient questions -Patient to return  in 3 months for continued foot care.  -Patient advised to call the office if any problems or questions arise in the meantime.  Bronson Ing, DPM

## 2023-03-25 NOTE — Patient Instructions (Signed)

## 2023-03-28 ENCOUNTER — Ambulatory Visit: Payer: Medicare Other

## 2023-04-16 ENCOUNTER — Other Ambulatory Visit: Payer: Self-pay | Admitting: Family

## 2023-04-19 ENCOUNTER — Other Ambulatory Visit: Payer: Self-pay | Admitting: Internal Medicine

## 2023-04-23 ENCOUNTER — Ambulatory Visit: Payer: Medicare Other | Admitting: Internal Medicine

## 2023-04-26 ENCOUNTER — Other Ambulatory Visit: Payer: Self-pay | Admitting: Family

## 2023-06-17 ENCOUNTER — Other Ambulatory Visit: Payer: Self-pay | Admitting: Family

## 2023-06-26 ENCOUNTER — Ambulatory Visit: Payer: Medicare Other | Admitting: Podiatry

## 2023-07-11 ENCOUNTER — Encounter: Payer: Self-pay | Admitting: Internal Medicine

## 2023-07-11 ENCOUNTER — Other Ambulatory Visit: Payer: Self-pay | Admitting: Internal Medicine

## 2023-07-11 ENCOUNTER — Ambulatory Visit (INDEPENDENT_AMBULATORY_CARE_PROVIDER_SITE_OTHER): Payer: Medicare Other | Admitting: Internal Medicine

## 2023-07-11 VITALS — BP 102/64 | HR 62 | Ht 65.0 in | Wt 172.2 lb

## 2023-07-11 DIAGNOSIS — E7849 Other hyperlipidemia: Secondary | ICD-10-CM | POA: Diagnosis not present

## 2023-07-11 DIAGNOSIS — E1159 Type 2 diabetes mellitus with other circulatory complications: Secondary | ICD-10-CM | POA: Diagnosis not present

## 2023-07-11 DIAGNOSIS — Z794 Long term (current) use of insulin: Secondary | ICD-10-CM

## 2023-07-11 DIAGNOSIS — E1165 Type 2 diabetes mellitus with hyperglycemia: Secondary | ICD-10-CM

## 2023-07-11 DIAGNOSIS — E119 Type 2 diabetes mellitus without complications: Secondary | ICD-10-CM

## 2023-07-11 LAB — HEMOGLOBIN A1C: Hemoglobin A1C: 10.5

## 2023-07-11 MED ORDER — NOVOLIN 70/30 (70-30) 100 UNIT/ML ~~LOC~~ SUSP: SUBCUTANEOUS | Status: DC

## 2023-07-11 NOTE — Patient Instructions (Addendum)
Please change NovoLog 70/30 insulin: - 30 units before b'fast - 20 units before lunch - 15 units before dinner Take the insulin 10-15 min before meals      Please return in 3 to 4 months.

## 2023-07-11 NOTE — Progress Notes (Signed)
Patient ID: JARMON JAVID, male   DOB: 1940/06/09, 83 y.o.   MRN: [id number]  HPI: TALYN DESSERT is a 83 y.o.-year-old male, returning for follow-up for DM2, dx in 1990, insulin-dependent since 2006, uncontrolled, with complications (CVA, CKD, DR, DKA, hypoglycemia). Pt. previously saw Dr. Everardo All, but last visit with me 6 months ago.   He is alone today, not accompanied by his daughter.  Interim history: No increased urination, blurry vision, nausea, chest pain. Since last OV, he did obtain a CGM.   Reviewed HbA1c: Lab Results  Component Value Date   HGBA1C 9.0 (A) 01/22/2023   HGBA1C 8.4 (H) 10/15/2022   HGBA1C 8.3 (H) 08/02/2022   HGBA1C 8.9 (A) 02/07/2022   HGBA1C 8.2 (H) 01/03/2022   HGBA1C 8.8 (A) 11/17/2021   HGBA1C 10.5 (A) 09/15/2021   HGBA1C 9.4 (A) 06/23/2021   HGBA1C 9.8 (A) 03/17/2021   HGBA1C 9.2 (H) 02/20/2021   He is on: - Novolog Insulin 70/30 (vials) - 10-15 min before meals: 30 >> 20 >> 22 units in a.m.(8 am) 42 >> 20 >> 15 >> 22 units in p.m. (5 pm)  Pt checks his sugars >4x a day and they are:  Prev.: - am: 90-100 or, if eating at night: 200 - 2h after b'fast: 130 if no eating at night - before lunch: 90-120 - 2h after lunch: n/c - before dinner: 200 - 2h after dinner: <200 - bedtime: 100-180 - nighttime: 80-140 Lowest sugar was 75 >> 90s; he has hypoglycemia awareness at 70.  Highest sugar was 300s >> 300s.  Glucometer: One Touch  - + CKD, last BUN/creatinine:  Lab Results  Component Value Date   BUN 12 10/16/2022   BUN 22 10/15/2022   CREATININE 1.04 10/16/2022   CREATININE 1.50 (H) 10/15/2022  On lisinopril 20 mg daily.  -+ HL; last set of lipids: Lab Results  Component Value Date   CHOL 111 10/15/2022   HDL 46 10/15/2022   LDLCALC 38 10/15/2022   LDLDIRECT 121.0 03/25/2019   TRIG 134 10/15/2022   CHOLHDL 2.4 10/15/2022  On Lipitor 40 mg daily.  - last eye exam was in 2023. + DR. + glaucoma. She has a retina specialist also  (Dr. Ashley Royalty).  - no numbness and tingling in his feet.  Last foot exam 03/25/2023 by Dr. Irving Shows.  He also has a history of complete heart block, prostate cancer, acute cholecystitis 08/2022.  ROS: + see HPI  Past Medical History:  Diagnosis Date   Diabetes mellitus without complication (HCC)    Glaucoma    Heart block    complete heart block   Hypertension    Prostate cancer (HCC)    Been 3-4 years ago   Past Surgical History:  Procedure Laterality Date   CATARACT EXTRACTION Bilateral    INSERTION PROSTATE RADIATION SEED     IR PERC CHOLECYSTOSTOMY  08/17/2022   IR RADIOLOGIST EVAL & MGMT  09/28/2022   IR RADIOLOGIST EVAL & MGMT  10/10/2022   Social History   Socioeconomic History   Marital status: Widowed    Spouse name: Not on file   Number of children: 1   Years of education: 26   Highest education level: Not on file  Occupational History   Occupation: Retired  Tobacco Use   Smoking status: Former    Current packs/day: 0.00    Average packs/day: 0.5 packs/day for 4.0 years (2.0 ttl pk-yrs)    Types: Cigarettes    Start date: 10/29/1970  Quit date: 10/29/1974    Years since quitting: 48.7   Smokeless tobacco: Never  Vaping Use   Vaping status: Never Used  Substance and Sexual Activity   Alcohol use: No   Drug use: No   Sexual activity: Not Currently  Other Topics Concern   Not on file  Social History Narrative   Born and raised in [place], [place]. Currently reside in a private residence by himself. Daughter lives in the area. No live. Fun: hunt and fish   Denies religious beliefs that would effect health care.    Social Determinants of Health   Financial Resource Strain: Low Risk  (03/26/2022)   Overall Financial Resource Strain (CARDIA)    Difficulty of Paying Living Expenses: Not hard at all  Food Insecurity: No Food Insecurity (10/16/2022)   Hunger Vital Sign    Worried About Running Out of Food in the Last Year: Never true    Ran Out of Food in  the Last Year: Never true  Transportation Needs: No Transportation Needs (10/16/2022)   PRAPARE - Administrator, Civil Service (Medical): No    Lack of Transportation (Non-Medical): No  Physical Activity: Inactive (03/26/2022)   Exercise Vital Sign    Days of Exercise per Week: 0 days    Minutes of Exercise per Session: 0 min  Stress: No Stress Concern Present (03/26/2022)   [employer] of Occupational Health - Occupational Stress Questionnaire    Feeling of Stress : Not at all  Social Connections: Unknown (05/28/2019)   Social Connection and Isolation Panel [NHANES]    Frequency of Communication with Friends and Family: More than three times a week    Frequency of Social Gatherings with Friends and Family: More than three times a week    Attends Religious Services: Not on file    Active Member of Clubs or Organizations: Yes    Attends Banker Meetings: More than 4 times per year    Marital Status: Widowed  Intimate Partner Violence: Not At Risk (10/16/2022)   Humiliation, Afraid, Rape, and Kick questionnaire    Fear of Current or Ex-Partner: No    Emotionally Abused: No    Physically Abused: No    Sexually Abused: No   Current Outpatient Medications on File Prior to Visit  Medication Sig Dispense Refill   acetaminophen (TYLENOL) 500 MG tablet Take 1,000 mg by mouth daily as needed (pain).     amLODipine (NORVASC) 10 MG tablet Take 1 tablet (10 mg total) by mouth daily. 90 tablet 3   atorvastatin (LIPITOR) 40 MG tablet TAKE 1 TABLET BY MOUTH EVERY DAY 30 tablet 2   benzonatate (TESSALON) 100 MG capsule Take 1 capsule (100 mg total) by mouth 3 (three) times daily as needed. 20 capsule 0   Blood Glucose Monitoring Suppl (ACCU-CHEK GUIDE) w/Device KIT Use As Directed 1 kit 0   brimonidine (ALPHAGAN) 0.2 % ophthalmic solution Place 1 drop into both eyes 2 (two) times daily with breakfast and lunch.     clopidogrel (PLAVIX) 75 MG tablet TAKE 1 TABLET BY MOUTH  EVERY DAY 30 tablet 2   Continuous Blood Gluc Receiver (FREESTYLE LIBRE 3 READER) DEVI 1 each by Does not apply route daily. 1 each 0   Continuous Blood Gluc Sensor (FREESTYLE LIBRE 3 SENSOR) MISC 1 each by Does not apply route every 14 (fourteen) days. 6 each 3   dorzolamide-timolol (COSOPT) 22.3-6.8 MG/ML ophthalmic solution Place 1 drop into both eyes 2 (two) times  daily with breakfast and lunch.     Insulin Syringe-Needle U-100 (BD INSULIN SYRINGE U/F) 31G X 5/16" 1 ML MISC USE 2 DAILY 200 each 4   latanoprost (XALATAN) 0.005 % ophthalmic solution Place 1 drop into both eyes at bedtime.     lisinopril (ZESTRIL) 20 MG tablet TAKE 1 TABLET BY MOUTH EVERY DAY 90 tablet 3   magnesium oxide (MAG-OX) 400 (240 Mg) MG tablet Take 1 tablet (400 mg total) by mouth 2 (two) times daily. 60 tablet 11   Multiple Vitamins-Minerals (PRESERVISION AREDS 2) CAPS Take 1 capsule by mouth at bedtime.     NOVOLIN 70/30 (70-30) 100 UNIT/ML injection INJECT 20 UNITS INTO THE SKIN 2 (TWO) TIMES DAILY WITH A MEAL. 10 mL 2   ONETOUCH DELICA LANCETS 33G MISC Use to check blood sugar 4 times per day. Dx code: E11.9 200 each 2   ONETOUCH ULTRA test strip USE TO MONITOR GLUCOSE LEVELS 4 TIMES PER DAY E11.9 100 each 2   pantoprazole (PROTONIX) 40 MG tablet TAKE 1 TABLET (40 MG TOTAL) BY MOUTH TWICE A DAY BEFORE MEALS 60 tablet 2   sodium chloride flush 0.9 % SOLN injection Inject 5 mLs into the vein as needed for up to 30 doses. Discard remainder of syringe. (Patient taking differently: Inject 5 mLs into the vein daily.) 300 mL 0   No current facility-administered medications on file prior to visit.   No Known Allergies Family History  Problem Relation Age of Onset   Diabetes Father    Diabetes Paternal Grandfather    PE: BP 102/64   Pulse 62   Ht 5\' 5"  (1.651 m)   Wt 172 lb 3.2 oz (78.1 kg)   SpO2 99%   BMI 28.66 kg/m  Wt Readings from Last 3 Encounters:  07/11/23 172 lb 3.2 oz (78.1 kg)  01/22/23 166 lb 3.2 oz  (75.4 kg)  11/16/22 171 lb 6.4 oz (77.7 kg)   Constitutional: normal weight, in NAD, walks with a walker Eyes:  EOMI, no exophthalmos ENT: no neck masses, no cervical lymphadenopathy Cardiovascular: RRR, No MRG, + B LE swelling Respiratory: CTA B Musculoskeletal: no deformities Skin:no rashes Neurological: no tremor with outstretched hands  ASSESSMENT: 1. 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 HL  PLAN:  1. Patient with longstanding, uncontrolled, type 2 diabetes, on premixed insulin regimen, with poor control.  At last visit, HbA1c was higher, at 9.0%.  He was checking sugars frequently so I suggested a freestyle libre CGM.  Reviewing blood sugars at home, they were at target if he was not eating in the middle of the night but they were higher if he was eating fruit cups around 1-2 AM.  Upon questioning, daughter noticed that his blood sugars were dropping around that time, sometimes even to the 70s.  We reduced his NPH insulin at night.  Since daughter was giving him the injections, we will would not it be feasible to switch him to a basal/bolus insulin regimen at that time.  I did not suggest any medications with weight loss as a side effect, since he had lost 22 pounds in the 3 months prior to our last visit.  We did discuss about healthier versions of snacks including unsalted nuts, veggies, or certain fruit with lower glycemic index like berries, apples, pears, etc. CGM interpretation: -At today's visit, we reviewed his  CGM downloads: It appears that 22% of values are in target range (goal >70%), while 78% are higher than 180 (goal <25%), and 0% are lower than 70 (goal <4%).  The calculated average blood sugar is 237.  The projected HbA1c for the next 3 months (GMI) is 9%. -Reviewing the CGM trends, sugars appear to be very high, increased from before.   They decrease overnight, touching the target range, but then increase significantly after breakfast and then even more so after lunch.  After dinner, which is approximately at 7 PM, sugars are actually dropping and they continue to drop with a nadir around 3 AM. -I am not completely sure what could have caused the increase in blood sugars compared to last visit.  The patient cannot clarify this, but he denies starting steroids or having degraded insulin (he keeps it in the fridge).  He also mentions that he is taking slightly higher doses than insulin than recommended at last visit, which is appropriate. -Therefore, I advised him to try to increase the dose of NovoLog to 30 units before breakfast, and also add a dose of insulin before lunch.  Before dinner, I advised him to reduce the insulin dose to avoid dropping his blood sugars overnight. - I suggested to:  Patient Instructions  Please change NovoLog 70/30 insulin: - 30 units before b'fast - 20 units before lunch - 15 units before dinner Take the insulin 10-15 min before meals   Please return in 3 to 4 months.  - we checked his HbA1c: 10.5% (higher) - advised to check sugars at different times of the day - 4x a day, rotating check times - advised for yearly eye exams >> he is UTD - return to clinic in 3-4 months  2. HL -Reviewed the latest lipid panel: All fractions at goal: Lab Results  Component Value Date   CHOL 111 10/15/2022   HDL 46 10/15/2022   LDLCALC 38 10/15/2022   LDLDIRECT 121.0 03/25/2019   TRIG 134 10/15/2022   CHOLHDL 2.4 10/15/2022  - Continues Lipitor 40 mg daily without side effect  Carlus Pavlov, MD PhD [facility]

## 2023-07-12 ENCOUNTER — Other Ambulatory Visit: Payer: Self-pay | Admitting: Internal Medicine

## 2023-07-12 ENCOUNTER — Telehealth: Payer: Self-pay | Admitting: Internal Medicine

## 2023-07-12 MED ORDER — NOVOLIN 70/30 (70-30) 100 UNIT/ML ~~LOC~~ SUSP: SUBCUTANEOUS | 1 refills | Status: DC

## 2023-07-12 NOTE — Telephone Encounter (Signed)
Patient's daughter, Okey Regal, called to say that the pharmacy told her that there was an error on the part of [facility] in the sending of the prescription for NovoLIN 70/30 insulin NPH-regular Human (NOVOLIN 70/30) (70-30) 100 UNIT/ML injection.  Sonnie Alamo states that patient 's dose was increased and now he is out of insulin.   [id number] [place], [place] - [address] AT [facility] [address] [place] [address] Phone: [phone]  Fax: [phone]    Patient was last seen yesterday 07/11/2023.

## 2023-07-12 NOTE — Telephone Encounter (Signed)
New script has been sent through his office visit note on 07/11/23 to [facility].

## 2023-07-12 NOTE — Addendum Note (Signed)
Addended by: Pollie Meyer on: 07/12/2023 04:09 PM   Modules accepted: Orders

## 2023-07-12 NOTE — Telephone Encounter (Signed)
Left a detailed message,   J.Harwood Nall,RMA  

## 2023-07-13 ENCOUNTER — Other Ambulatory Visit: Payer: Self-pay | Admitting: Family

## 2023-07-24 ENCOUNTER — Ambulatory Visit (INDEPENDENT_AMBULATORY_CARE_PROVIDER_SITE_OTHER): Payer: Medicare Other | Admitting: Emergency Medicine

## 2023-07-24 VITALS — Ht 65.0 in | Wt 172.0 lb

## 2023-07-24 DIAGNOSIS — Z0000 Encounter for general adult medical examination without abnormal findings: Secondary | ICD-10-CM

## 2023-07-24 NOTE — Patient Instructions (Signed)
[name] , Thank you for taking time to come for your Medicare Wellness Visit. I appreciate your ongoing commitment to your health goals. Please review the following plan we discussed and let me know if I can assist you in the future.   These are the goals we discussed:  Goals       Exercise 3x per week (30 min per time) (pt-stated)        This is a list of the screening recommended for you and due dates:  Health Maintenance  Topic Date Due   Zoster (Shingles) Vaccine (1 of 2) Never done   Yearly kidney health urinalysis for diabetes  05/02/2016   Eye exam for diabetics  02/27/2022   COVID-19 Vaccine (5 - 2023-24 season) 08/31/2022   Hemoglobin A1C  07/23/2023   Flu Shot  08/01/2023   Yearly kidney function blood test for diabetes  10/17/2023   Complete foot exam   03/24/2024   Medicare Annual Wellness Visit  07/23/2024   DTaP/Tdap/Td vaccine (2 - Td or Tdap) 05/21/2026   Pneumonia Vaccine  Completed   HPV Vaccine  Aged Out    Advanced directives: Information on Advanced Care Planning can be found at [facility] Advance Health Care Directives Advance Health Care Directives ([email or web address]    Conditions/risks identified: Discuss Shingles vaccine with your doctor at your next visit. Call and schedule eye exam. You should be getting an eye exam every year. It was a pleasure speaking with you today.  Next appointment: Follow up in one year for your annual wellness visit. 07/28/24 @ 8:20am  Preventive Care 65 Years and Older, Male  Preventive care refers to lifestyle choices and visits with your health care provider that can promote health and wellness. What does preventive care include? A yearly physical exam. This is also called an annual well check. Dental exams once or twice a year. Routine eye exams. Ask your health care provider how often you should have your eyes checked. Personal lifestyle choices, including: Daily care of your teeth and gums. Regular  physical activity. Eating a healthy diet. Avoiding tobacco and drug use. Limiting alcohol use. Practicing safe sex. Taking low doses of aspirin every day. Taking vitamin and mineral supplements as recommended by your health care provider. What happens during an annual well check? The services and screenings done by your health care provider during your annual well check will depend on your age, overall health, lifestyle risk factors, and family history of disease. Counseling  Your health care provider may ask you questions about your: Alcohol use. Tobacco use. Drug use. Emotional well-being. Home and relationship well-being. Sexual activity. Eating habits. History of falls. Memory and ability to understand (cognition). Work and work Astronomer. Screening  You may have the following tests or measurements: Height, weight, and BMI. Blood pressure. Lipid and cholesterol levels. These may be checked every 5 years, or more frequently if you are over 34 years old. Skin check. Lung cancer screening. You may have this screening every year starting at age 32 if you have a 30-pack-year history of smoking and currently smoke or have quit within the past 15 years. Fecal occult blood test (FOBT) of the stool. You may have this test every year starting at age 4. Flexible sigmoidoscopy or colonoscopy. You may have a sigmoidoscopy every 5 years or a colonoscopy every 10 years starting at age 84. Prostate cancer screening. Recommendations will vary depending on your family history and other risks. Hepatitis C blood  test. Hepatitis B blood test. Sexually transmitted disease (STD) testing. Diabetes screening. This is done by checking your blood sugar (glucose) after you have not eaten for a while (fasting). You may have this done every 1-3 years. Abdominal aortic aneurysm (AAA) screening. You may need this if you are a current or former smoker. Osteoporosis. You may be screened starting at age 5  if you are at high risk. Talk with your health care provider about your test results, treatment options, and if necessary, the need for more tests. Vaccines  Your health care provider may recommend certain vaccines, such as: Influenza vaccine. This is recommended every year. Tetanus, diphtheria, and acellular pertussis (Tdap, Td) vaccine. You may need a Td booster every 10 years. Zoster vaccine. You may need this after age 81. Pneumococcal 13-valent conjugate (PCV13) vaccine. One dose is recommended after age 57. Pneumococcal polysaccharide (PPSV23) vaccine. One dose is recommended after age 76. Talk to your health care provider about which screenings and vaccines you need and how often you need them. This information is not intended to replace advice given to you by your health care provider. Make sure you discuss any questions you have with your health care provider. Document Released: 01/13/2016 Document Revised: 09/05/2016 Document Reviewed: 10/18/2015 Elsevier Interactive Patient Education  2017 [employer].  Fall Prevention in the Home Falls can cause injuries. They can happen to people of all ages. There are many things you can do to make your home safe and to help prevent falls. What can I do on the outside of my home? Regularly fix the edges of walkways and driveways and fix any cracks. Remove anything that might make you trip as you walk through a door, such as a raised step or threshold. Trim any bushes or trees on the path to your home. Use bright outdoor lighting. Clear any walking paths of anything that might make someone trip, such as rocks or tools. Regularly check to see if handrails are loose or broken. Make sure that both sides of any steps have handrails. Any raised decks and porches should have guardrails on the edges. Have any leaves, snow, or ice cleared regularly. Use sand or salt on walking paths during winter. Clean up any spills in your garage right away. This  includes oil or grease spills. What can I do in the bathroom? Use night lights. Install grab bars by the toilet and in the tub and shower. Do not use towel bars as grab bars. Use non-skid mats or decals in the tub or shower. If you need to sit down in the shower, use a plastic, non-slip stool. Keep the floor dry. Clean up any water that spills on the floor as soon as it happens. Remove soap buildup in the tub or shower regularly. Attach bath mats securely with double-sided non-slip rug tape. Do not have throw rugs and other things on the floor that can make you trip. What can I do in the bedroom? Use night lights. Make sure that you have a light by your bed that is easy to reach. Do not use any sheets or blankets that are too big for your bed. They should not hang down onto the floor. Have a firm chair that has side arms. You can use this for support while you get dressed. Do not have throw rugs and other things on the floor that can make you trip. What can I do in the kitchen? Clean up any spills right away. Avoid walking on wet  floors. Keep items that you use a lot in easy-to-reach places. If you need to reach something above you, use a strong step stool that has a grab bar. Keep electrical cords out of the way. Do not use floor polish or wax that makes floors slippery. If you must use wax, use non-skid floor wax. Do not have throw rugs and other things on the floor that can make you trip. What can I do with my stairs? Do not leave any items on the stairs. Make sure that there are handrails on both sides of the stairs and use them. Fix handrails that are broken or loose. Make sure that handrails are as long as the stairways. Check any carpeting to make sure that it is firmly attached to the stairs. Fix any carpet that is loose or worn. Avoid having throw rugs at the top or bottom of the stairs. If you do have throw rugs, attach them to the floor with carpet tape. Make sure that you  have a light switch at the top of the stairs and the bottom of the stairs. If you do not have them, ask someone to add them for you. What else can I do to help prevent falls? Wear shoes that: Do not have high heels. Have rubber bottoms. Are comfortable and fit you well. Are closed at the toe. Do not wear sandals. If you use a stepladder: Make sure that it is fully opened. Do not climb a closed stepladder. Make sure that both sides of the stepladder are locked into place. Ask someone to hold it for you, if possible. Clearly mark and make sure that you can see: Any grab bars or handrails. First and last steps. Where the edge of each step is. Use tools that help you move around (mobility aids) if they are needed. These include: Canes. Walkers. Scooters. Crutches. Turn on the lights when you go into a dark area. Replace any light bulbs as soon as they burn out. Set up your furniture so you have a clear path. Avoid moving your furniture around. If any of your floors are uneven, fix them. If there are any pets around you, be aware of where they are. Review your medicines with your doctor. Some medicines can make you feel dizzy. This can increase your chance of falling. Ask your doctor what other things that you can do to help prevent falls. This information is not intended to replace advice given to you by your health care provider. Make sure you discuss any questions you have with your health care provider. Document Released: 10/13/2009 Document Revised: 05/24/2016 Document Reviewed: 01/21/2015 Elsevier Interactive Patient Education  2017 [employer].

## 2023-07-24 NOTE — Progress Notes (Signed)
Subjective:  Per patient no change in vitals since last visit; unable to obtain new vitals due to this being a telehealth visit.  Patient was unable to self-report vital signs via telehealth due to a lack of equipment at home.    Andre Stout is a 83 y.o. male who presents for Medicare Annual/Subsequent preventive examination.  Visit Complete: Virtual  I connected with  Andre Stout on 07/24/23 by a audio enabled telemedicine application and verified that I am speaking with the correct person using two identifiers.  Patient Location: Home  Provider Location: Home Office  I discussed the limitations of evaluation and management by telemedicine. The patient expressed understanding and agreed to proceed.   Review of Systems     Cardiac Risk Factors include: advanced age (>89men, >25 women);male gender;diabetes mellitus;hypertension;Other (see comment), Risk factor comments: TIA, AV complete heart block     Objective:    Today's Vitals   07/24/23 0830  Weight: 172 lb (78 kg)  Height: 5\' 5"  (1.651 m)   Body mass index is 28.62 kg/m.     07/24/2023    8:50 AM 10/16/2022   12:16 PM 10/15/2022    1:01 AM 08/15/2022    4:39 PM 08/01/2022    3:50 PM 07/31/2022    8:25 PM 03/26/2022    3:41 PM  Advanced Directives  Does Patient Have a Medical Advance Directive? No  No No  No No  Would patient like information on creating a medical advance directive? Yes (MAU/Ambulatory/Procedural Areas - Information given) No - Patient declined  No - Patient declined No - Patient declined      Current Medications (verified) Outpatient Encounter Medications as of 07/24/2023  Medication Sig   acetaminophen (TYLENOL) 500 MG tablet Take 1,000 mg by mouth daily as needed (pain).   amLODipine (NORVASC) 10 MG tablet Take 1 tablet (10 mg total) by mouth daily.   atorvastatin (LIPITOR) 40 MG tablet TAKE 1 TABLET BY MOUTH EVERY DAY   benzonatate (TESSALON) 100 MG capsule Take 1 capsule (100 mg total) by  mouth 3 (three) times daily as needed.   Blood Glucose Monitoring Suppl (ACCU-CHEK GUIDE) w/Device KIT Use As Directed   brimonidine (ALPHAGAN) 0.2 % ophthalmic solution Place 1 drop into both eyes 2 (two) times daily with breakfast and lunch.   clopidogrel (PLAVIX) 75 MG tablet TAKE 1 TABLET BY MOUTH EVERY DAY   Continuous Blood Gluc Receiver (FREESTYLE LIBRE 3 READER) DEVI 1 each by Does not apply route daily.   Continuous Blood Gluc Sensor (FREESTYLE LIBRE 3 SENSOR) MISC 1 each by Does not apply route every 14 (fourteen) days.   dorzolamide-timolol (COSOPT) 22.3-6.8 MG/ML ophthalmic solution Place 1 drop into both eyes 2 (two) times daily with breakfast and lunch.   insulin NPH-regular Human (NOVOLIN 70/30) (70-30) 100 UNIT/ML injection INJECT 65-70 units under skin as advised   Insulin Syringe-Needle U-100 (BD INSULIN SYRINGE U/F) 31G X 5/16" 1 ML MISC USE 2 DAILY   latanoprost (XALATAN) 0.005 % ophthalmic solution Place 1 drop into both eyes at bedtime.   lisinopril (ZESTRIL) 20 MG tablet TAKE 1 TABLET BY MOUTH EVERY DAY   magnesium oxide (MAG-OX) 400 (240 Mg) MG tablet Take 1 tablet (400 mg total) by mouth 2 (two) times daily.   Multiple Vitamins-Minerals (PRESERVISION AREDS 2) CAPS Take 1 capsule by mouth at bedtime.   ONETOUCH DELICA LANCETS 33G MISC Use to check blood sugar 4 times per day. Dx code: E11.9   [facility]  ULTRA test strip USE TO MONITOR GLUCOSE LEVELS 4 TIMES PER DAY E11.9   pantoprazole (PROTONIX) 40 MG tablet TAKE 1 TABLET (40 MG TOTAL) BY MOUTH TWICE A DAY BEFORE MEALS   sodium chloride flush 0.9 % SOLN injection Inject 5 mLs into the vein as needed for up to 30 doses. Discard remainder of syringe. (Patient taking differently: Inject 5 mLs into the vein daily.)   No facility-administered encounter medications on file as of 07/24/2023.    Allergies (verified) Patient has no known allergies.   History: Past Medical History:  Diagnosis Date   Diabetes mellitus without  complication (HCC)    Glaucoma    Heart block    complete heart block   Hypertension    Prostate cancer (HCC)    Been 3-4 years ago   Past Surgical History:  Procedure Laterality Date   CATARACT EXTRACTION Bilateral    INSERTION PROSTATE RADIATION SEED     IR PERC CHOLECYSTOSTOMY  08/17/2022   IR RADIOLOGIST EVAL & MGMT  09/28/2022   IR RADIOLOGIST EVAL & MGMT  10/10/2022   Family History  Problem Relation Age of Onset   Other Mother        unknown medical history   Diabetes Father    Diabetes Paternal Grandfather    Social History   Socioeconomic History   Marital status: Widowed    Spouse name: Not on file   Number of children: 1   Years of education: 48   Highest education level: Not on file  Occupational History   Occupation: Retired  Tobacco Use   Smoking status: Former    Current packs/day: 0.00    Average packs/day: 0.5 packs/day for 4.0 years (2.0 ttl pk-yrs)    Types: Cigarettes    Start date: 10/29/1970    Quit date: 10/29/1974    Years since quitting: 48.7   Smokeless tobacco: Never  Vaping Use   Vaping status: Never Used  Substance and Sexual Activity   Alcohol use: No   Drug use: No   Sexual activity: Not Currently  Other Topics Concern   Not on file  Social History Narrative   Born and raised in [place], [place]. Currently resides with his daughter (temporary), but he still has a residence.  No live. Fun: hunt and fish   Denies religious beliefs that would effect health care.    Social Determinants of Health   Financial Resource Strain: Low Risk  (07/24/2023)   Overall Financial Resource Strain (CARDIA)    Difficulty of Paying Living Expenses: Not hard at all  Food Insecurity: No Food Insecurity (07/24/2023)   Hunger Vital Sign    Worried About Running Out of Food in the Last Year: Never true    Ran Out of Food in the Last Year: Never true  Transportation Needs: No Transportation Needs (07/24/2023)   PRAPARE - Scientist, research (physical sciences) (Medical): No    Lack of Transportation (Non-Medical): No  Physical Activity: Inactive (07/24/2023)   Exercise Vital Sign    Days of Exercise per Week: 0 days    Minutes of Exercise per Session: 0 min  Stress: No Stress Concern Present (07/24/2023)   [employer] of Occupational Health - Occupational Stress Questionnaire    Feeling of Stress : Not at all  Social Connections: Socially Isolated (07/24/2023)   Social Connection and Isolation Panel [NHANES]    Frequency of Communication with Friends and Family: Once a week    Frequency of Social  Gatherings with Friends and Family: Once a week    Attends Religious Services: Never    Database administrator or Organizations: No    Attends Banker Meetings: Never    Marital Status: Widowed    Tobacco Counseling Counseling given: Not Answered   Clinical Intake:  Pre-visit preparation completed: Yes  Pain : No/denies pain     BMI - recorded: 28.62 Nutritional Status: BMI 25 -29 Overweight Nutritional Risks: None Diabetes: Yes CBG done?: No (115 fasting this am per patient) Did pt. bring in CBG monitor from home?: No  How often do you need to have someone help you when you read instructions, pamphlets, or other written materials from your doctor or pharmacy?: 1 - Never  Interpreter Needed?: No  Information entered by :: Tora Kindred, CMA   Activities of Daily Living    07/24/2023    8:34 AM 10/16/2022   12:17 PM  In your present state of health, do you have any difficulty performing the following activities:  Hearing? 0   Vision? 0   Difficulty concentrating or making decisions? 0   Walking or climbing stairs? 1   Comment has a walker   Dressing or bathing? 0   Doing errands, shopping? 1 0  Comment daughter takes to appts, Sales executive and eating ? N   Using the Toilet? N   In the past six months, have you accidently leaked urine? Y   Comment patient wears depends   Do you have  problems with loss of bowel control? N   Managing your Medications? Y   Comment daughter fills med box   Managing your Finances? N   Housekeeping or managing your Housekeeping? Y   Comment daughter does housekeeping     Patient Care Team: Olive Bass, FNP as PCP - General (Internal Medicine) O'Neal, Ronnald Ramp, MD as PCP - Cardiology (Cardiology) Marinus Maw, MD as PCP - Electrophysiology (Cardiology) Carlus Pavlov, MD as Consulting Physician (Internal Medicine)  Indicate any recent Medical Services you may have received from other than [facility] providers in the past year (date may be approximate).     Assessment:   This is a routine wellness examination for [place].  Hearing/Vision screen Hearing Screening - Comments:: Denies hearing loss  Dietary issues and exercise activities discussed:     Goals Addressed               This Visit's Progress     Exercise 3x per week (30 min per time) (pt-stated)        Depression Screen    07/24/2023    8:48 AM 10/23/2022   11:12 AM 08/30/2022   11:29 AM 03/26/2022    3:44 PM 01/12/2022    3:06 PM 10/03/2021    2:24 PM 08/22/2021    3:35 PM  PHQ 2/9 Scores  PHQ - 2 Score 0 0 0 0 0 0 0  PHQ- 9 Score 0          Fall Risk    07/24/2023    8:51 AM 10/23/2022   11:12 AM 08/30/2022   11:29 AM 03/26/2022    3:43 PM 01/12/2022    3:05 PM  Fall Risk   Falls in the past year? 0 0 0 0 1  Number falls in past yr: 0 0 0 0 0  Injury with Fall? 0 0 0 0 0  Risk for fall due to : Impaired mobility No Fall Risks No  Fall Risks  Impaired balance/gait  Follow up Falls prevention discussed Falls evaluation completed Falls evaluation completed Falls prevention discussed Falls evaluation completed;Falls prevention discussed    MEDICARE RISK AT HOME:  Medicare Risk at Home - 07/24/23 0851     Any stairs in or around the home? Yes    If so, are there any without handrails? Yes    Home free of loose throw rugs in walkways, pet  beds, electrical cords, etc? Yes    Adequate lighting in your home to reduce risk of falls? Yes    Life alert? No    Use of a cane, walker or w/c? Yes    Grab bars in the bathroom? Yes    Shower chair or bench in shower? Yes    Elevated toilet seat or a handicapped toilet? Yes             TIMED UP AND GO:  Was the test performed?  No    Cognitive Function:        07/24/2023    8:52 AM  6CIT Screen  What Year? 4 points  What month? 3 points  What time? 3 points  Count back from 20 0 points  Months in reverse 4 points  Repeat phrase 2 points  Total Score 16 points    Immunizations Immunization History  Administered Date(s) Administered   Fluad Quad(high Dose 65+) 10/27/2019, 09/29/2021   Influenza, High Dose Seasonal PF 11/05/2015, 11/26/2017, 09/22/2018   Influenza-Unspecified 09/22/2018   PFIZER(Purple Top)SARS-COV-2 Vaccination 02/06/2020, 03/02/2020, 12/14/2020   Pfizer Covid-19 Vaccine Bivalent Booster 69yrs & up 12/26/2021   Pneumococcal Conjugate-13 05/02/2015   Pneumococcal Polysaccharide-23 05/21/2016   Tdap 05/21/2016    TDAP status: Up to date  Flu Vaccine status: Due, Education has been provided regarding the importance of this vaccine. Advised may receive this vaccine at local pharmacy or Health Dept. Aware to provide a copy of the vaccination record if obtained from local pharmacy or Health Dept. Verbalized acceptance and understanding.  Pneumococcal vaccine status: Up to date  Covid-19 vaccine status: Information provided on how to obtain vaccines.   Qualifies for Shingles Vaccine? Yes   Zostavax completed No   Shingrix Completed?: No.    Education has been provided regarding the importance of this vaccine. Patient has been advised to call insurance company to determine out of pocket expense if they have not yet received this vaccine. Advised may also receive vaccine at local pharmacy or Health Dept. Verbalized acceptance and  understanding.  Screening Tests Health Maintenance  Topic Date Due   Zoster Vaccines- Shingrix (1 of 2) Never done   Diabetic kidney evaluation - Urine ACR  05/02/2016   OPHTHALMOLOGY EXAM  02/27/2022   COVID-19 Vaccine (5 - 2023-24 season) 08/31/2022   HEMOGLOBIN A1C  07/23/2023   INFLUENZA VACCINE  08/01/2023   Diabetic kidney evaluation - eGFR measurement  10/17/2023   FOOT EXAM  03/24/2024   Medicare Annual Wellness (AWV)  07/23/2024   DTaP/Tdap/Td (2 - Td or Tdap) 05/21/2026   Pneumonia Vaccine 44+ Years old  Completed   HPV VACCINES  Aged Out    Health Maintenance  Health Maintenance Due  Topic Date Due   Zoster Vaccines- Shingrix (1 of 2) Never done   Diabetic kidney evaluation - Urine ACR  05/02/2016   OPHTHALMOLOGY EXAM  02/27/2022   COVID-19 Vaccine (5 - 2023-24 season) 08/31/2022   HEMOGLOBIN A1C  07/23/2023    Colorectal cancer screening: No longer required.  Lung Cancer Screening: (Low Dose CT Chest recommended if Age 35-80 years, 20 pack-year currently smoking OR have quit w/in 15years.) does not qualify.   Lung Cancer Screening Referral: n/a  Additional Screening:  Hepatitis C Screening: does not qualify; Completed never  Vision Screening: Recommended annual ophthalmology exams for early detection of glaucoma and other disorders of the eye. Is the patient up to date with their annual eye exam?  No  Who is the provider or what is the name of the office in which the patient attends annual eye exams? Dr. Alan Mulder If pt is not established with a provider, would they like to be referred to a provider to establish care? No .   Dental Screening: Recommended annual dental exams for proper oral hygiene  Diabetic Foot Exam: Diabetic Foot Exam: Completed 03/25/23  Community Resource Referral / Chronic Care Management: CRR required this visit?  No   CCM required this visit?  No     Plan:     I have personally reviewed and noted the following in the  patient's chart:   Medical and social history Use of alcohol, tobacco or illicit drugs  Current medications and supplements including opioid prescriptions. Patient is not currently taking opioid prescriptions. Functional ability and status Nutritional status Physical activity Advanced directives List of other physicians Hospitalizations, surgeries, and ER visits in previous 12 months Vitals Screenings to include cognitive, depression, and falls Referrals and appointments  In addition, I have reviewed and discussed with patient certain preventive protocols, quality metrics, and best practice recommendations. A written personalized care plan for preventive services as well as general preventive health recommendations were provided to patient.     Tora Kindred, CMA   07/24/2023   After Visit Summary: (MyChart) Due to this being a telephonic visit, the after visit summary with patients personalized plan was offered to patient via MyChart   Nurse Notes:  6 CIT Score - 16 Patient needs Urine ACR Patient wants to discuss Shingrix vaccine with you prior to getting. Patient advised to call eye doctor and schedule eye exam Patient's daughter helped with today's visit.

## 2023-08-18 ENCOUNTER — Observation Stay (HOSPITAL_BASED_OUTPATIENT_CLINIC_OR_DEPARTMENT_OTHER)
Admission: EM | Admit: 2023-08-18 | Discharge: 2023-08-20 | Disposition: A | Discharge status: Home / Self Care | Payer: Medicare Other | Attending: Family Medicine | Admitting: Family Medicine

## 2023-08-18 ENCOUNTER — Emergency Department (HOSPITAL_BASED_OUTPATIENT_CLINIC_OR_DEPARTMENT_OTHER): Payer: Medicare Other

## 2023-08-18 ENCOUNTER — Other Ambulatory Visit: Payer: Self-pay

## 2023-08-18 ENCOUNTER — Encounter (HOSPITAL_BASED_OUTPATIENT_CLINIC_OR_DEPARTMENT_OTHER): Payer: Self-pay | Admitting: Emergency Medicine

## 2023-08-18 DIAGNOSIS — N179 Acute kidney failure, unspecified: Secondary | ICD-10-CM | POA: Diagnosis present

## 2023-08-18 DIAGNOSIS — Z8673 Personal history of transient ischemic attack (TIA), and cerebral infarction without residual deficits: Secondary | ICD-10-CM

## 2023-08-18 DIAGNOSIS — E1159 Type 2 diabetes mellitus with other circulatory complications: Secondary | ICD-10-CM

## 2023-08-18 DIAGNOSIS — I441 Atrioventricular block, second degree: Secondary | ICD-10-CM | POA: Insufficient documentation

## 2023-08-18 DIAGNOSIS — E1169 Type 2 diabetes mellitus with other specified complication: Secondary | ICD-10-CM

## 2023-08-18 DIAGNOSIS — R1011 Right upper quadrant pain: Secondary | ICD-10-CM | POA: Diagnosis present

## 2023-08-18 DIAGNOSIS — Z8719 Personal history of other diseases of the digestive system: Secondary | ICD-10-CM | POA: Insufficient documentation

## 2023-08-18 DIAGNOSIS — I152 Hypertension secondary to endocrine disorders: Secondary | ICD-10-CM

## 2023-08-18 DIAGNOSIS — I442 Atrioventricular block, complete: Secondary | ICD-10-CM | POA: Diagnosis present

## 2023-08-18 DIAGNOSIS — Z8546 Personal history of malignant neoplasm of prostate: Secondary | ICD-10-CM | POA: Diagnosis not present

## 2023-08-18 DIAGNOSIS — Y732 Prosthetic and other implants, materials and accessory gastroenterology and urology devices associated with adverse incidents: Secondary | ICD-10-CM | POA: Diagnosis not present

## 2023-08-18 DIAGNOSIS — E119 Type 2 diabetes mellitus without complications: Secondary | ICD-10-CM | POA: Diagnosis not present

## 2023-08-18 DIAGNOSIS — T85528A Displacement of other gastrointestinal prosthetic devices, implants and grafts, initial encounter: Principal | ICD-10-CM | POA: Diagnosis present

## 2023-08-18 DIAGNOSIS — Z794 Long term (current) use of insulin: Secondary | ICD-10-CM | POA: Diagnosis not present

## 2023-08-18 DIAGNOSIS — Z79899 Other long term (current) drug therapy: Secondary | ICD-10-CM | POA: Insufficient documentation

## 2023-08-18 DIAGNOSIS — Z87891 Personal history of nicotine dependence: Secondary | ICD-10-CM | POA: Diagnosis not present

## 2023-08-18 LAB — CBC WITH DIFFERENTIAL/PLATELET
Abs Immature Granulocytes: 0.03 10*3/uL (ref 0.00–0.07)
Basophils Absolute: 0.1 10*3/uL (ref 0.0–0.1)
Basophils Relative: 1 %
Eosinophils Absolute: 0.3 10*3/uL (ref 0.0–0.5)
Eosinophils Relative: 3 %
HCT: 41.8 % (ref 39.0–52.0)
Hemoglobin: 13.4 g/dL (ref 13.0–17.0)
Immature Granulocytes: 0 %
Lymphocytes Relative: 31 %
Lymphs Abs: 2.6 10*3/uL (ref 0.7–4.0)
MCH: 26.2 pg (ref 26.0–34.0)
MCHC: 32.1 g/dL (ref 30.0–36.0)
MCV: 81.8 fL (ref 80.0–100.0)
Monocytes Absolute: 0.8 10*3/uL (ref 0.1–1.0)
Monocytes Relative: 10 %
Neutro Abs: 4.5 10*3/uL (ref 1.7–7.7)
Neutrophils Relative %: 55 %
Platelets: 271 10*3/uL (ref 150–400)
RBC: 5.11 MIL/uL (ref 4.22–5.81)
RDW: 15.6 % — ABNORMAL HIGH (ref 11.5–15.5)
WBC: 8.2 10*3/uL (ref 4.0–10.5)
nRBC: 0 % (ref 0.0–0.2)

## 2023-08-18 LAB — COMPREHENSIVE METABOLIC PANEL
ALT: 39 U/L (ref 0–44)
AST: 31 U/L (ref 15–41)
Albumin: 3.6 g/dL (ref 3.5–5.0)
Alkaline Phosphatase: 74 U/L (ref 38–126)
Anion gap: 7 (ref 5–15)
BUN: 22 mg/dL (ref 8–23)
CO2: 27 mmol/L (ref 22–32)
Calcium: 9.8 mg/dL (ref 8.9–10.3)
Chloride: 104 mmol/L (ref 98–111)
Creatinine, Ser: 1.36 mg/dL — ABNORMAL HIGH (ref 0.61–1.24)
GFR, Estimated: 52 mL/min — ABNORMAL LOW (ref >60)
Glucose, Bld: 141 mg/dL — ABNORMAL HIGH (ref 70–99)
Potassium: 5 mmol/L (ref 3.5–5.1)
Sodium: 138 mmol/L (ref 135–145)
Total Bilirubin: 0.9 mg/dL (ref 0.3–1.2)
Total Protein: 7.3 g/dL (ref 6.5–8.1)

## 2023-08-18 LAB — GLUCOSE, CAPILLARY: Glucose-Capillary: 292 mg/dL — ABNORMAL HIGH (ref 70–99)

## 2023-08-18 MED ORDER — LACTATED RINGERS IV SOLN: INTRAVENOUS | Status: DC

## 2023-08-18 MED ORDER — INSULIN ASPART 100 UNIT/ML IJ SOLN
0.0000 [IU] | Freq: Every day | INTRAMUSCULAR | Status: DC
  Administered 2023-08-18: 3 [IU] via SUBCUTANEOUS

## 2023-08-18 MED ORDER — SODIUM CHLORIDE 0.9% FLUSH
3.0000 mL | Freq: Two times a day (BID) | INTRAVENOUS | Status: DC
  Administered 2023-08-18 – 2023-08-20 (×4): 3 mL via INTRAVENOUS

## 2023-08-18 MED ORDER — HYDROMORPHONE HCL 1 MG/ML IJ SOLN: 0.5000 mg | INTRAMUSCULAR | Status: DC | PRN

## 2023-08-18 MED ORDER — ONDANSETRON HCL 4 MG/2ML IJ SOLN: 4.0000 mg | Freq: Four times a day (QID) | INTRAMUSCULAR | Status: DC | PRN

## 2023-08-18 MED ORDER — PANTOPRAZOLE SODIUM 40 MG PO TBEC
40.0000 mg | DELAYED_RELEASE_TABLET | Freq: Two times a day (BID) | ORAL | Status: DC
  Administered 2023-08-19 – 2023-08-20 (×3): 40 mg via ORAL
  Filled 2023-08-18 (×3): qty 1

## 2023-08-18 MED ORDER — ACETAMINOPHEN 650 MG RE SUPP: 650.0000 mg | Freq: Four times a day (QID) | RECTAL | Status: DC | PRN

## 2023-08-18 MED ORDER — IOHEXOL 300 MG/ML  SOLN
100.0000 mL | Freq: Once | INTRAMUSCULAR | Status: AC | PRN
  Administered 2023-08-18: 100 mL via INTRAVENOUS

## 2023-08-18 MED ORDER — ACETAMINOPHEN 325 MG PO TABS: 650.0000 mg | ORAL_TABLET | Freq: Four times a day (QID) | ORAL | Status: DC | PRN

## 2023-08-18 MED ORDER — AMLODIPINE BESYLATE 10 MG PO TABS
10.0000 mg | ORAL_TABLET | Freq: Every day | ORAL | Status: DC
  Administered 2023-08-19 – 2023-08-20 (×2): 10 mg via ORAL
  Filled 2023-08-18 (×2): qty 1

## 2023-08-18 MED ORDER — INSULIN ASPART 100 UNIT/ML IJ SOLN
0.0000 [IU] | Freq: Three times a day (TID) | INTRAMUSCULAR | Status: DC
  Administered 2023-08-19: 2 [IU] via SUBCUTANEOUS

## 2023-08-18 MED ORDER — ONDANSETRON HCL 4 MG PO TABS: 4.0000 mg | ORAL_TABLET | Freq: Four times a day (QID) | ORAL | Status: DC | PRN

## 2023-08-18 MED ORDER — HYDROMORPHONE HCL 1 MG/ML IJ SOLN
0.5000 mg | Freq: Once | INTRAMUSCULAR | Status: AC
  Administered 2023-08-18: 0.5 mg via INTRAVENOUS
  Filled 2023-08-18: qty 1

## 2023-08-18 MED ORDER — OXYCODONE HCL 5 MG PO TABS: 5.0000 mg | ORAL_TABLET | ORAL | Status: DC | PRN

## 2023-08-18 MED ORDER — ATORVASTATIN CALCIUM 40 MG PO TABS
40.0000 mg | ORAL_TABLET | Freq: Every day | ORAL | Status: DC
  Administered 2023-08-18: 40 mg via ORAL
  Filled 2023-08-18: qty 1

## 2023-08-18 NOTE — Assessment & Plan Note (Signed)
Patient is asymptomatic.  Follows with cardiology Dr. Ladona Ridgel, on watchful waiting.

## 2023-08-18 NOTE — Assessment & Plan Note (Signed)
Continue atorvastatin.  Hold antiplatelets tonight pending IR evaluation.

## 2023-08-18 NOTE — ED Notes (Signed)
Kim at [facility] will send transport for Bed Ready at [facility] 4W RM# [id number](NS)

## 2023-08-18 NOTE — Assessment & Plan Note (Signed)
Continue amlodipine, hold lisinopril for now.

## 2023-08-18 NOTE — Hospital Course (Signed)
Andre Stout is a 83 y.o. 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

## 2023-08-18 NOTE — Assessment & Plan Note (Signed)
Placed on SSI. 

## 2023-08-18 NOTE — Assessment & Plan Note (Signed)
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  Decreased output recently.  CT A/P shows tube appears to have pulled back with small amount of fluid between the liver and gallbladder. -IR consulted for further management -Will keep n.p.o. after midnight

## 2023-08-18 NOTE — ED Provider Notes (Incomplete)
[place] EMERGENCY DEPARTMENT AT [facility] Provider Note   CSN: [id number] Arrival date & time: 08/18/23  1207     History {Add pertinent medical, surgical, social history, OB history to HPI:1} Chief Complaint  Patient presents with   Abdominal Pain    Andre Stout is a 83 y.o. male.  83 year old male with a history of complete heart block without pacemaker, diabetes, hypertension, and cholecystitis status post PERC cholecystostomy tube in 07/2022 who presents to the emergency department with pain near his cholecystostomy site.  Was diagnosed with cholecystitis year ago.  Due to cardiac reasons they decided to do a port cholecystostomy tube and have him follow-up with general surgery afterwards.  Says that for the past month has had decreased output from the tube and right upper quadrant pain.  Worsened recently and family decided to bring him into the emergency department for evaluation.  No nausea, vomiting, fevers, diarrhea.       Home Medications Prior to Admission medications   Medication Sig Start Date End Date Taking? Authorizing Provider  acetaminophen (TYLENOL) 500 MG tablet Take 1,000 mg by mouth daily as needed (pain).    [provider]  amLODipine (NORVASC) 10 MG tablet Take 1 tablet (10 mg total) by mouth daily. 10/23/22 10/18/23  Olive Bass, FNP  atorvastatin (LIPITOR) 40 MG tablet TAKE 1 TABLET BY MOUTH EVERY DAY 07/15/23   Olive Bass, FNP  benzonatate (TESSALON) 100 MG capsule Take 1 capsule (100 mg total) by mouth 3 (three) times daily as needed. 03/01/23   Olive Bass, FNP  Blood Glucose Monitoring Suppl (ACCU-CHEK GUIDE) w/Device KIT Use As Directed 08/24/22   Lanae Boast, MD  brimonidine (ALPHAGAN) 0.2 % ophthalmic solution Place 1 drop into both eyes 2 (two) times daily with breakfast and lunch. 12/18/21   [provider]  clopidogrel (PLAVIX) 75 MG tablet TAKE 1 TABLET BY MOUTH EVERY DAY 07/15/23    Olive Bass, FNP  Continuous Blood Gluc Receiver (FREESTYLE LIBRE 3 READER) DEVI 1 each by Does not apply route daily. 01/22/23   Carlus Pavlov, MD  Continuous Blood Gluc Sensor (FREESTYLE LIBRE 3 SENSOR) MISC 1 each by Does not apply route every 14 (fourteen) days. 01/22/23   Carlus Pavlov, MD  dorzolamide-timolol (COSOPT) 22.3-6.8 MG/ML ophthalmic solution Place 1 drop into both eyes 2 (two) times daily with breakfast and lunch. 05/18/22   [provider]  insulin NPH-regular Human (NOVOLIN 70/30) (70-30) 100 UNIT/ML injection INJECT 65-70 units under skin as advised 07/12/23   Carlus Pavlov, MD  Insulin Syringe-Needle U-100 (BD INSULIN SYRINGE U/F) 31G X 5/16" 1 ML MISC USE 2 DAILY 11/21/22   Carlus Pavlov, MD  latanoprost (XALATAN) 0.005 % ophthalmic solution Place 1 drop into both eyes at bedtime. 01/01/22   [provider]  lisinopril (ZESTRIL) 20 MG tablet TAKE 1 TABLET BY MOUTH EVERY DAY 02/01/23   Olive Bass, FNP  magnesium oxide (MAG-OX) 400 (240 Mg) MG tablet Take 1 tablet (400 mg total) by mouth 2 (two) times daily. 12/07/22 12/02/23  Olive Bass, FNP  Multiple Vitamins-Minerals (PRESERVISION AREDS 2) CAPS Take 1 capsule by mouth at bedtime.    [provider]  [facility] DELICA LANCETS 33G MISC Use to check blood sugar 4 times per day. Dx code: E11.9 03/27/16   Romero Belling, MD  [facility] ULTRA test strip USE TO MONITOR GLUCOSE LEVELS 4 TIMES PER DAY E11.9 06/10/19   Romero Belling, MD  pantoprazole (PROTONIX) 40  MG tablet TAKE 1 TABLET (40 MG TOTAL) BY MOUTH TWICE A DAY BEFORE MEALS 07/15/23   Olive Bass, FNP  sodium chloride flush 0.9 % SOLN injection Inject 5 mLs into the vein as needed for up to 30 doses. Discard remainder of syringe. Patient taking differently: Inject 5 mLs into the vein daily. 08/24/22   Lanae Boast, MD      Allergies    Patient has no known allergies.    Review of Systems   Review of  Systems  Physical Exam Updated Vital Signs BP 132/66   Pulse 60   Temp 97.8 F (36.6 C) (Oral)   Resp 18   SpO2 100%  Physical Exam Vitals and nursing note reviewed.  Constitutional:      General: He is not in acute distress.    Appearance: He is well-developed.  HENT:     Head: Normocephalic and atraumatic.     Right Ear: External ear normal.     Left Ear: External ear normal.     Nose: Nose normal.  Eyes:     Extraocular Movements: Extraocular movements intact.     Conjunctiva/sclera: Conjunctivae normal.     Pupils: Pupils are equal, round, and reactive to light.  Cardiovascular:     Rate and Rhythm: Normal rate and regular rhythm.  Pulmonary:     Effort: Pulmonary effort is normal. No respiratory distress.  Abdominal:     General: There is no distension.     Palpations: Abdomen is soft. There is no mass.     Tenderness: There is no abdominal tenderness. There is no guarding.     Comments: Percutaneous cholecystostomy tube in right upper quadrant.  Small amount of bilious drainage in bag.  Musculoskeletal:     Cervical back: Normal range of motion and neck supple.     Right lower leg: No edema.     Left lower leg: No edema.  Skin:    General: Skin is warm and dry.  Neurological:     Mental Status: He is alert. Mental status is at baseline.  Psychiatric:        Mood and Affect: Mood normal.        Behavior: Behavior normal.     ED Results / Procedures / Treatments   Labs (all labs ordered are listed, but only abnormal results are displayed) Labs Reviewed - No data to display  EKG None  Radiology No results found.  Procedures Procedures  {Document cardiac monitor, telemetry assessment procedure when appropriate:1}  Medications Ordered in ED Medications - No data to display  ED Course/ Medical Decision Making/ A&P   {   Click here for ABCD2, HEART and other calculatorsREFRESH Note before signing :1}                              Medical Decision  Making Amount and/or Complexity of Data Reviewed Labs: ordered. Radiology: ordered.  Risk Prescription drug management.   ***  {Document critical care time when appropriate:1} {Document review of labs and clinical decision tools ie heart score, Chads2Vasc2 etc:1}  {Document your independent review of radiology images, and any outside records:1} {Document your discussion with family members, caretakers, and with consultants:1} {Document social determinants of health affecting pt's care:1} {Document your decision making why or why not admission, treatments were needed:1} Final Clinical Impression(s) / ED Diagnoses Final diagnoses:  None    Rx / DC Orders ED Discharge Orders  None

## 2023-08-18 NOTE — H&P (Signed)
History and Physical    Andre Stout ZOX:[id number] DOB: [birth year] DOA: 08/18/2023  PCP: Olive Bass, FNP  Patient coming from: Home  I have personally briefly reviewed patient's old medical records in [facility]  Chief Complaint: RUQ abdominal pain at site of cholecystostomy tube  HPI: Andre Stout is a 83 y.o. 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  Patient states that he has been having some pain in his right upper quadrant at the site of where his percutaneous cholecystostomy tube is inserted.  This has been ongoing for about 1 month.  More recently he has noticed minimal output from the drain.  He has not had any fevers, chills, diaphoresis, nausea, vomiting, dysuria, diarrhea.  He reports good oral intake.  He has not had any chest pain, lightheadedness, dizziness, or syncope.  [facility] ED Course  Labs/Imaging on admission: I have personally reviewed following labs and imaging studies.  Initial vitals showed BP 121/55, pulse 49, RR 18, temp 97.8 F, SpO2 99% on room air.  Labs show sodium 138, potassium 5.0, bicarb 27, BUN 22, creatinine 1.36, serum glucose 141, LFTs within normal limits, WBC 8.2, hemoglobin 13.4, platelets 271,000.  CT abdomen/pelvis showed percutaneous cholecystostomy tube appears to have pulled back.  Part of catheter appears to be outside the gallbladder wall and there may be some leakage around the catheter.  Small amount of fluid between the liver and gallbladder noted.  No large biloma or abscess seen.  Small gallstones noted but no findings for acute cholecystitis.  Stable severe pancreatic atrophy but no mass or acute inflammation reported.  Stable 10 cm right renal cyst also seen.  EDP discussed with general surgery and IR who  recommended medical admission for IR consultation.  The hospitalist service was consulted to admit.  Review of Systems: All systems reviewed and are negative except as documented in history of present illness above.   Past Medical History:  Diagnosis Date   Diabetes mellitus without complication (HCC)    Glaucoma    Heart block    complete heart block   Hypertension    Prostate cancer (HCC)    Been 3-4 years ago    Past Surgical History:  Procedure Laterality Date   CATARACT EXTRACTION Bilateral    INSERTION PROSTATE RADIATION SEED     IR PERC CHOLECYSTOSTOMY  08/17/2022   IR RADIOLOGIST EVAL & MGMT  09/28/2022   IR RADIOLOGIST EVAL & MGMT  10/10/2022    Social History:  reports that he quit smoking about 48 years ago. His smoking use included cigarettes. He started smoking about 52 years ago. He has a 2 pack-year smoking history. He has never used smokeless tobacco. He reports that he does not drink alcohol and does not use drugs.  No Known Allergies  Family History  Problem Relation Age of Onset   Other Mother        unknown medical history   Diabetes Father    Diabetes Paternal Grandfather      Prior to Admission medications   Medication Sig Start Date End Date Taking? Authorizing Provider  acetaminophen (TYLENOL) 500 MG tablet Take 1,000 mg by mouth daily as needed (pain).    [provider]  amLODipine (NORVASC) 10 MG tablet Take 1 tablet (10 mg total) by mouth daily. 10/23/22 10/18/23  Dayton Scrape,  Allyne Gee, FNP  atorvastatin (LIPITOR) 40 MG tablet TAKE 1 TABLET BY MOUTH EVERY DAY 07/15/23   Olive Bass, FNP  benzonatate (TESSALON) 100 MG capsule Take 1 capsule (100 mg total) by mouth 3 (three) times daily as needed. 03/01/23   Olive Bass, FNP  Blood Glucose Monitoring Suppl (ACCU-CHEK GUIDE) w/Device KIT Use As Directed 08/24/22   Lanae Boast, MD  brimonidine (ALPHAGAN) 0.2 % ophthalmic solution Place 1 drop into both eyes 2 (two) times  daily with breakfast and lunch. 12/18/21   [provider]  clopidogrel (PLAVIX) 75 MG tablet TAKE 1 TABLET BY MOUTH EVERY DAY 07/15/23   Olive Bass, FNP  Continuous Blood Gluc Receiver (FREESTYLE LIBRE 3 READER) DEVI 1 each by Does not apply route daily. 01/22/23   Carlus Pavlov, MD  Continuous Blood Gluc Sensor (FREESTYLE LIBRE 3 SENSOR) MISC 1 each by Does not apply route every 14 (fourteen) days. 01/22/23   Carlus Pavlov, MD  dorzolamide-timolol (COSOPT) 22.3-6.8 MG/ML ophthalmic solution Place 1 drop into both eyes 2 (two) times daily with breakfast and lunch. 05/18/22   [provider]  insulin NPH-regular Human (NOVOLIN 70/30) (70-30) 100 UNIT/ML injection INJECT 65-70 units under skin as advised 07/12/23   Carlus Pavlov, MD  Insulin Syringe-Needle U-100 (BD INSULIN SYRINGE U/F) 31G X 5/16" 1 ML MISC USE 2 DAILY 11/21/22   Carlus Pavlov, MD  latanoprost (XALATAN) 0.005 % ophthalmic solution Place 1 drop into both eyes at bedtime. 01/01/22   [provider]  lisinopril (ZESTRIL) 20 MG tablet TAKE 1 TABLET BY MOUTH EVERY DAY 02/01/23   Olive Bass, FNP  magnesium oxide (MAG-OX) 400 (240 Mg) MG tablet Take 1 tablet (400 mg total) by mouth 2 (two) times daily. 12/07/22 12/02/23  Olive Bass, FNP  Multiple Vitamins-Minerals (PRESERVISION AREDS 2) CAPS Take 1 capsule by mouth at bedtime.    [provider]  [facility] DELICA LANCETS 33G MISC Use to check blood sugar 4 times per day. Dx code: E11.9 03/27/16   Romero Belling, MD  [facility] ULTRA test strip USE TO MONITOR GLUCOSE LEVELS 4 TIMES PER DAY E11.9 06/10/19   Romero Belling, MD  pantoprazole (PROTONIX) 40 MG tablet TAKE 1 TABLET (40 MG TOTAL) BY MOUTH TWICE A DAY BEFORE MEALS 07/15/23   Olive Bass, FNP  sodium chloride flush 0.9 % SOLN injection Inject 5 mLs into the vein as needed for up to 30 doses. Discard remainder of syringe. Patient taking differently: Inject  5 mLs into the vein daily. 08/24/22   Lanae Boast, MD    Physical Exam: Vitals:   08/18/23 1315 08/18/23 1330 08/18/23 1415 08/18/23 1802  BP: (!) 122/56 (!) 124/59 (!) 132/56 (!) 148/67  Pulse: (!) 55 (!) 52 (!) 44 64  Resp:    18  Temp:    97.9 F (36.6 C)  TempSrc:    Oral  SpO2: 100% 96% 95% 100%  Weight:    77.5 kg  Height:    5\' 5"  (1.651 m)   Constitutional: Resting in bed, NAD, calm, comfortable Eyes: EOMI, lids and conjunctivae normal ENMT: Mucous membranes are moist. Posterior pharynx clear of any exudate or lesions.Normal dentition.  Neck: normal, supple, no masses. Respiratory: clear to auscultation bilaterally, no wheezing, no crackles. Normal respiratory effort. No accessory muscle use.  Cardiovascular: Aorta , no murmurs / rubs / gallops. No extremity edema. 2+ pedal pulses. Abdomen: Mild RUQ tenderness, no masses palpated.  Cholecystostomy tube in place RUQ.  Clean dressing.  No output in drain bag. Musculoskeletal: no clubbing / cyanosis. No joint deformity upper and lower extremities. Good ROM, no contractures. Normal muscle tone.  Skin: no rashes, lesions, ulcers. No induration Neurologic: Sensation intact. Strength 5/5 in all 4.  Psychiatric: Alert and oriented x 3. Normal mood.   EKG: Personally reviewed. Mobitz type II second-degree AV block.  Previous EKG showed sinus rhythm with Mobitz 1 second-degree AV block November 2023.  Assessment/Plan Principal Problem:   Migration of percutaneous cholecystostomy tube Active Problems:   AKI (acute kidney injury) (HCC)   Heart block AV complete (HCC)   Insulin dependent type 2 diabetes mellitus (HCC)   History of CVA (cerebrovascular accident)   Hypertension associated with diabetes (HCC)   Hyperlipidemia associated with type 2 diabetes mellitus (HCC)   Andre Stout is a 83 y.o. 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  Assessment and Plan: * Migration of percutaneous cholecystostomy tube Patient with chronic percutaneous cholecystostomy tube placed 07/2022.  Decreased output recently.  CT A/P shows tube appears to have pulled back with small amount of fluid between the liver and gallbladder. -IR consulted for further management -Will keep n.p.o. after midnight  Heart block AV complete [facility]) Patient is asymptomatic.  Follows with cardiology Dr. Ladona Ridgel, on watchful waiting.  AKI (acute kidney injury) (HCC) Mild creatinine 1.36.  Continue IV fluid hydration overnight and repeat labs in AM.  Insulin dependent type 2 diabetes mellitus (HCC) Placed on SSI.  History of CVA (cerebrovascular accident) Continue atorvastatin.  Hold antiplatelets tonight pending IR evaluation.  Hyperlipidemia associated with type 2 diabetes mellitus (HCC) Continue atorvastatin.  Hypertension associated with diabetes (HCC) Continue amlodipine, hold lisinopril for now.  DVT prophylaxis: SCDs Start: 08/18/23 1914 Code Status: Full code, confirmed with patient on admission Family Communication: Discussed with patient, he has discussed with family Disposition Plan: Home pending IR evaluation for management of cholecystostomy tube Consults called: EDP consulted general surgery, IR Severity of Illness: The appropriate patient status for this patient is OBSERVATION. Observation status is judged to be reasonable and necessary in order to provide the required intensity of service to ensure the patient's safety. The patient's presenting symptoms, physical exam findings, and initial radiographic and laboratory data in the context of their medical condition is felt to place them at decreased risk for further clinical deterioration. Furthermore, it is anticipated that the patient will be medically stable for discharge from the hospital within 2  midnights of admission.   Darreld Mclean MD [facility]  If 7PM-7AM, please contact night-coverage [email or web address]  08/18/2023, 7:18 PM

## 2023-08-18 NOTE — Assessment & Plan Note (Signed)
Mild creatinine 1.36.  Continue IV fluid hydration overnight and repeat labs in AM.

## 2023-08-18 NOTE — ED Triage Notes (Signed)
Pt presents to ED POV. Pt c/o r side abd pain x2-3 weeks. Pt has drain cholecystostomy on same side for 20 months. Denies any GI/GU s/s or fever. Pt reports possibly less output from drain.

## 2023-08-18 NOTE — Assessment & Plan Note (Signed)
Continue atorvastatin

## 2023-08-18 NOTE — Progress Notes (Signed)
Plan of Care Note for accepted transfer   Patient: Andre Stout MRN: [id number]   DOA: 08/18/2023  Facility requesting transfer: [facility]   Requesting Provider: Dr. Jarold Motto   Reason for transfer: Suspected cholecystostomy tube displacement   Facility course: 83 yr old man with hx of HTN, HLD, DM, CHB followed by EP, and cholecystitis s/p cholecystostomy tube placement who p/w 2-3 wks of right-sided abdominal pain and decreased output from tube.   On CT, tube appears to have pulled back and there is a small amount of fluid between the liver and gallbladder. ED workup also notable for EKG with 2nd degree AV block Mobitz type II.   ED MD discussed with surgery and IR who recommended admission to [facility] or [facility] for IR consultation.    Plan of care: The patient is accepted for admission to Telemetry unit at [facility] or [facility]   Author: Briscoe Deutscher, MD 08/18/2023  Check [email or web address] for on-call coverage.  Nursing staff, Please call [facility] Admits & Consults System-Wide number on Amion as soon as patient's arrival, so appropriate admitting provider can evaluate the pt.

## 2023-08-19 ENCOUNTER — Observation Stay (HOSPITAL_COMMUNITY): Payer: Medicare Other

## 2023-08-19 DIAGNOSIS — E119 Type 2 diabetes mellitus without complications: Secondary | ICD-10-CM | POA: Diagnosis not present

## 2023-08-19 DIAGNOSIS — Z8719 Personal history of other diseases of the digestive system: Secondary | ICD-10-CM | POA: Diagnosis not present

## 2023-08-19 DIAGNOSIS — I441 Atrioventricular block, second degree: Secondary | ICD-10-CM | POA: Diagnosis not present

## 2023-08-19 DIAGNOSIS — T85528A Displacement of other gastrointestinal prosthetic devices, implants and grafts, initial encounter: Secondary | ICD-10-CM | POA: Diagnosis not present

## 2023-08-19 HISTORY — PX: IR EXCHANGE BILIARY DRAIN: IMG6046

## 2023-08-19 LAB — COMPREHENSIVE METABOLIC PANEL
ALT: 37 U/L (ref 0–44)
AST: 25 U/L (ref 15–41)
Albumin: 3.1 g/dL — ABNORMAL LOW (ref 3.5–5.0)
Alkaline Phosphatase: 68 U/L (ref 38–126)
Anion gap: 8 (ref 5–15)
BUN: 21 mg/dL (ref 8–23)
CO2: 23 mmol/L (ref 22–32)
Calcium: 8.9 mg/dL (ref 8.9–10.3)
Chloride: 103 mmol/L (ref 98–111)
Creatinine, Ser: 1.17 mg/dL (ref 0.61–1.24)
GFR, Estimated: >60 mL/min (ref >60)
Glucose, Bld: 227 mg/dL — ABNORMAL HIGH (ref 70–99)
Potassium: 4.1 mmol/L (ref 3.5–5.1)
Sodium: 134 mmol/L — ABNORMAL LOW (ref 135–145)
Total Bilirubin: 1.2 mg/dL (ref 0.3–1.2)
Total Protein: 6.2 g/dL — ABNORMAL LOW (ref 6.5–8.1)

## 2023-08-19 LAB — CBC
HCT: 37.2 % — ABNORMAL LOW (ref 39.0–52.0)
Hemoglobin: 11.7 g/dL — ABNORMAL LOW (ref 13.0–17.0)
MCH: 26.1 pg (ref 26.0–34.0)
MCHC: 31.5 g/dL (ref 30.0–36.0)
MCV: 83 fL (ref 80.0–100.0)
Platelets: 229 10*3/uL (ref 150–400)
RBC: 4.48 MIL/uL (ref 4.22–5.81)
RDW: 15.5 % (ref 11.5–15.5)
WBC: 6.9 10*3/uL (ref 4.0–10.5)
nRBC: 0 % (ref 0.0–0.2)

## 2023-08-19 LAB — GLUCOSE, CAPILLARY
Glucose-Capillary: 220 mg/dL — ABNORMAL HIGH (ref 70–99)
Glucose-Capillary: 204 mg/dL — ABNORMAL HIGH (ref 70–99)
Glucose-Capillary: 184 mg/dL — ABNORMAL HIGH (ref 70–99)
Glucose-Capillary: 258 mg/dL — ABNORMAL HIGH (ref 70–99)

## 2023-08-19 MED ORDER — LIDOCAINE HCL 1 % IJ SOLN
INTRAMUSCULAR | Status: AC
  Filled 2023-08-19: qty 20

## 2023-08-19 MED ORDER — INSULIN ASPART 100 UNIT/ML IJ SOLN
0.0000 [IU] | INTRAMUSCULAR | Status: DC
  Administered 2023-08-19: 5 [IU] via SUBCUTANEOUS
  Administered 2023-08-19: 3 [IU] via SUBCUTANEOUS
  Administered 2023-08-19: 2 [IU] via SUBCUTANEOUS
  Administered 2023-08-20 (×2): 3 [IU] via SUBCUTANEOUS
  Administered 2023-08-20: 7 [IU] via SUBCUTANEOUS

## 2023-08-19 MED ORDER — LIDOCAINE HCL 1 % IJ SOLN
10.0000 mL | Freq: Once | INTRAMUSCULAR | Status: AC
  Administered 2023-08-19: 10 mL via INTRADERMAL
  Filled 2023-08-19: qty 10

## 2023-08-19 MED ORDER — IOHEXOL 300 MG/ML  SOLN
50.0000 mL | Freq: Once | INTRAMUSCULAR | Status: AC | PRN
  Administered 2023-08-19: 10 mL

## 2023-08-19 MED ORDER — SILVER NITRATE-POT NITRATE 75-25 % EX MISC
CUTANEOUS | Status: AC
  Filled 2023-08-19: qty 10

## 2023-08-19 MED ORDER — ORAL CARE MOUTH RINSE: 15.0000 mL | OROMUCOSAL | Status: DC | PRN

## 2023-08-19 NOTE — Plan of Care (Signed)

## 2023-08-19 NOTE — Progress Notes (Signed)
HOSPITALIST ROUNDING NOTE AWS MALLARE [id number]  DOB: 1940/05/29  DOA: 08/18/2023  PCP: Olive Bass, FNP  08/19/2023,12:03 PM   LOS: 0 days      Code Status: Full code   From: Home  current Dispo: Likely home     83 year old lumbee male 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 etc. with prolonged stay until 8/29 Last seen by Dr. Sharrell Ku, EP and was felt at the time to be relatively asymptomatic and suspected that he would ultimately require PPM  Patient has been having some pain at his percutaneous cholecystotomy site for about a month-started having drainage 3 to 4 days ago [according to daughter who is a Engineer, civil (consulting) on the phone when I called her]  Vital stable BUN/creatinine 22/1.3 LFTs normal WBC 8 platelet 270 CT ABD pulling back of PERC cholecystotomy catheter outside gallbladder with leakage around catheter no biloma abscess small gallstones no acute cholecystitis stable right renal cyst  Plan  Outward migration PERC cholecystotomy Appreciative of IR input-hopeful for using the same track to be able to replace cholecystotomy Long discussion with daughter on phone regarding options for management-I do think stepwise fashion may benefit if deemd approp 1 OP pacemaker placement,  2 interval cholecystectomy if deemed feasible by care team Pain control oxycodone 5-10 every 4 as needed moderate, 0.5 Dilaudid IV every 3 as needed severe pain  Complete heart block Watchful waiting as per last office visit Dr. Ladona Ridgel who I will CC Careful with amlodipine 10 Lisinopril 20 on hold at this time-needs dosage reduction at discharge  AKI on admission-2/2 ACE +/- underlying  issues Resolving-force oral fluids recheck labs a.m.  Prior CVA 09/2022 Aspirin Plavix on hold-unclear if indication ongoing for aspirin?  Looks like he was only supposed to be on Plavix monotherapy after the initial stroke back in 09/2022-DC aspirin Resume statin as an outpatient  DM TY 2 Sugars 200-2 27 on sliding scale N.p.o. for procedure-only use sliding scale May need D5 if prolonged n.p.o. status   DVT prophylaxis: SCD  Status is: Observation The patient will require care spanning > 2 midnights and should be moved to inpatient because:   Requires tube replacement etc. as above    Subjective:  No pain no fever Slightly foul odor coming from abdominal area-area has been leaking and moist No abdominal pain N.p.o. for procedure  Objective + exam Vitals:   08/18/23 2141 08/19/23 0154 08/19/23 0600 08/19/23 1129  BP: 116/64 117/62 124/70 (!) 111/52  Pulse: (!) 58 (!) 56 60 (!) 58  Resp: 19  17 18   Temp: 98.2 F (36.8 C) 98.6 F (37 C) 98.5 F (36.9 C) 98.3 F (36.8 C)  TempSrc: Oral Oral Oral Oral  SpO2: 95% 97% 96% 95%  Weight:      Height:       Filed Weights   08/18/23 1802  Weight: 77.5 kg    Examination:  EOMI NCAT no focal deficit no icterus no pallor no wheeze no rales no rhonchi Abdomen slight distended slightly tender Chest clear no added sound wheeze rales rhonchi S1-S2 bradycardic with A-fib at times ROM intact no focal deficit Neurologically intact Skin soft supple no lower extremity edema  Data Reviewed: reviewed   CBC    Component Value Date/Time  WBC 6.9 08/19/2023 0358   RBC 4.48 08/19/2023 0358   HGB 11.7 (L) 08/19/2023 0358   HCT 37.2 (L) 08/19/2023 0358   PLT 229 08/19/2023 0358   MCV 83.0 08/19/2023 0358   MCH 26.1 08/19/2023 0358   MCHC 31.5 08/19/2023 0358   RDW 15.5 08/19/2023 0358   LYMPHSABS 2.6 08/18/2023 1324   MONOABS 0.8 08/18/2023 1324   EOSABS 0.3 08/18/2023 1324   BASOSABS 0.1 08/18/2023 1324      Latest  Ref Rng & Units 08/19/2023    3:58 AM 08/18/2023    1:24 PM 10/16/2022    5:46 AM  CMP  Glucose 70 - 99 mg/dL 409  811  914   BUN 8 - 23 mg/dL 21  22  12    Creatinine 0.61 - 1.24 mg/dL 7.82  9.56  2.13   Sodium 135 - 145 mmol/L 134  138  138   Potassium 3.5 - 5.1 mmol/L 4.1  5.0  4.2   Chloride 98 - 111 mmol/L 103  104  104   CO2 22 - 32 mmol/L 23  27  20    Calcium 8.9 - 10.3 mg/dL 8.9  9.8  9.8   Total Protein 6.5 - 8.1 g/dL 6.2  7.3    Total Bilirubin 0.3 - 1.2 mg/dL 1.2  0.9    Alkaline Phos 38 - 126 U/L 68  74    AST 15 - 41 U/L 25  31    ALT 0 - 44 U/L 37  39       Scheduled Meds:  amLODipine  10 mg Oral Daily   atorvastatin  40 mg Oral QHS   insulin aspart  0-5 Units Subcutaneous QHS   insulin aspart  0-9 Units Subcutaneous TID WC   pantoprazole  40 mg Oral BID AC   sodium chloride flush  3 mL Intravenous Q12H   Continuous Infusions:  Time 46  Rhetta Mura, MD  [facility]

## 2023-08-19 NOTE — Inpatient Diabetes Management (Signed)
Inpatient Diabetes Program Recommendations  AACE/ADA: New Consensus Statement on Inpatient Glycemic Control (2015)  Target Ranges:  Prepandial:   less than 140 mg/dL      Peak postprandial:   less than 180 mg/dL (1-2 hours)      Critically ill patients:  140 - 180 mg/dL   Lab Results  Component Value Date   GLUCAP 204 (H) 08/19/2023   HGBA1C 9.0 (A) 01/22/2023    Review of Glycemic Control  Latest Reference Range & Units 08/18/23 21:39 08/19/23 07:59 08/19/23 11:26  Glucose-Capillary 70 - 99 mg/dL 147 (H) 829 (H) 562 (H)   Diabetes history: DM  Outpatient Diabetes medications:  Novolin 70/30 20 units bid FSL 3 sensor Current orders for Inpatient glycemic control:  Novolog 0-9 units tid with meals   Inpatient Diabetes Program Recommendations:    Please consider changing Novolog correction to q 4 hours and add Semglee 8 units daily.   Thanks  Beryl Meager, RN, BC-ADM Inpatient Diabetes Coordinator Pager [phone]  (8a-5p)

## 2023-08-19 NOTE — Care Management Obs Status (Signed)
MEDICARE OBSERVATION STATUS NOTIFICATION   Patient Details  Name: Andre Stout MRN: [id number] Date of Birth: 06-04-1940   Medicare Observation Status Notification Given:       Howell Rucks, RN 08/19/2023, 10:57 AM

## 2023-08-19 NOTE — TOC CM/SW Note (Addendum)
Transition of Care [facility]) - Inpatient Brief Assessment   Patient Details  Name: Andre Stout MRN: [id number] Date of Birth: 01-31-1940  Transition of Care [facility]) CM/SW Contact:    Howell Rucks, RN Phone Number: 08/19/2023, 10:16 AM   Clinical Narrative: Met with pt at bedside to introduce role of TOC/NCM and review for dc planning. Pt confirms he has a PCP and pharmacy in place, no current home care services. Home DME: walker, shower chair, potty chair, pt reports he feels safe returning home and has good family support, family to provide transportation at discharge. TOC Brief Assessment completed. No TOC needs identified.   -10:58am Met with pt at bedside, MOON explained, form signed, copy provided to pt.     Transition of Care Asessment: Insurance and Status: Insurance coverage has been reviewed Patient has primary care physician: Yes Home environment has been reviewed: private residence with good family support Prior level of function:: Independent with walker as needed Prior/Current Home Services: No current home services Social Determinants of Health Reivew: SDOH reviewed no interventions necessary Readmission risk has been reviewed: Yes Transition of care needs: no transition of care needs at this time

## 2023-08-19 NOTE — Plan of Care (Signed)
  Problem: Safety: Goal: Ability to remain free from injury will improve Outcome: Progressing   Problem: Pain Managment: Goal: General experience of comfort will improve Outcome: Progressing   Problem: Clinical Measurements: Goal: Ability to maintain clinical measurements within normal limits will improve Outcome: Progressing

## 2023-08-19 NOTE — Procedures (Signed)
Vascular and Interventional Radiology Procedure Note  Patient: Andre Stout DOB: [birth year] Medical Record Number: [id number] Note Date/Time: 08/19/23 3:02 PM   Performing Physician: Roanna Banning, MD Assistant(s): None  Diagnosis: Hx of acute cholecystitis. Drain placed 08/17/22  Procedure:  CHOLECYSTOSTOMY TUBE EXCHANG, REPOSITIONING and UPSIZE  Anesthesia: Local Anesthetic Complications: None Estimated Blood Loss: Minimal Specimens:  None  Findings:  Successful exchange for a new 21F cholecystostomy tube. NON-patent cystic duct  Plan: Flush tube w 5 mL sterile NS q8h and record drain output qShift. Follow up for routine tube evaluation in 6-8 week(s).   See detailed procedure note with images in PACS. The patient tolerated the procedure well without incident or complication and was returned to Floor Bed in stable condition.    Roanna Banning, MD [facility]   Pager. [phone] Clinic. [phone]

## 2023-08-20 DIAGNOSIS — T85528A Displacement of other gastrointestinal prosthetic devices, implants and grafts, initial encounter: Secondary | ICD-10-CM | POA: Diagnosis not present

## 2023-08-20 LAB — RENAL FUNCTION PANEL
Albumin: 3 g/dL — ABNORMAL LOW (ref 3.5–5.0)
Anion gap: 7 (ref 5–15)
BUN: 17 mg/dL (ref 8–23)
CO2: 23 mmol/L (ref 22–32)
Calcium: 8.8 mg/dL — ABNORMAL LOW (ref 8.9–10.3)
Chloride: 102 mmol/L (ref 98–111)
Creatinine, Ser: 1.14 mg/dL (ref 0.61–1.24)
GFR, Estimated: >60 mL/min (ref >60)
Glucose, Bld: 218 mg/dL — ABNORMAL HIGH (ref 70–99)
Phosphorus: 3.2 mg/dL (ref 2.5–4.6)
Potassium: 3.9 mmol/L (ref 3.5–5.1)
Sodium: 132 mmol/L — ABNORMAL LOW (ref 135–145)

## 2023-08-20 LAB — CBC WITH DIFFERENTIAL/PLATELET
Abs Immature Granulocytes: 0.02 10*3/uL (ref 0.00–0.07)
Basophils Absolute: 0.1 10*3/uL (ref 0.0–0.1)
Basophils Relative: 1 %
Eosinophils Absolute: 0.3 10*3/uL (ref 0.0–0.5)
Eosinophils Relative: 5 %
HCT: 38.2 % — ABNORMAL LOW (ref 39.0–52.0)
Hemoglobin: 11.9 g/dL — ABNORMAL LOW (ref 13.0–17.0)
Immature Granulocytes: 0 %
Lymphocytes Relative: 37 %
Lymphs Abs: 2.2 10*3/uL (ref 0.7–4.0)
MCH: 26 pg (ref 26.0–34.0)
MCHC: 31.2 g/dL (ref 30.0–36.0)
MCV: 83.6 fL (ref 80.0–100.0)
Monocytes Absolute: 0.8 10*3/uL (ref 0.1–1.0)
Monocytes Relative: 13 %
Neutro Abs: 2.6 10*3/uL (ref 1.7–7.7)
Neutrophils Relative %: 44 %
Platelets: 240 10*3/uL (ref 150–400)
RBC: 4.57 MIL/uL (ref 4.22–5.81)
RDW: 15.2 % (ref 11.5–15.5)
WBC: 6 10*3/uL (ref 4.0–10.5)
nRBC: 0 % (ref 0.0–0.2)

## 2023-08-20 LAB — GLUCOSE, CAPILLARY
Glucose-Capillary: 158 mg/dL — ABNORMAL HIGH (ref 70–99)
Glucose-Capillary: 206 mg/dL — ABNORMAL HIGH (ref 70–99)
Glucose-Capillary: 230 mg/dL — ABNORMAL HIGH (ref 70–99)
Glucose-Capillary: 337 mg/dL — ABNORMAL HIGH (ref 70–99)

## 2023-08-20 MED ORDER — LISINOPRIL 20 MG PO TABS: 10.0000 mg | ORAL_TABLET | Freq: Every day | ORAL | 3 refills | Status: DC

## 2023-08-20 MED ORDER — OXYCODONE HCL 5 MG PO TABS: 5.0000 mg | ORAL_TABLET | ORAL | 0 refills | Status: DC | PRN

## 2023-08-20 NOTE — Progress Notes (Signed)
Mobility Specialist - Progress Note   08/20/23 1336  Mobility  Activity Ambulated with assistance in hallway  Level of Assistance Minimal assist, patient does 75% or more  Assistive Device Front wheel walker  Distance Ambulated (ft) 112 ft  Range of Motion/Exercises Active Assistive  Activity Response Tolerated well  Mobility Referral Yes  $Mobility charge 1 Mobility   Pt was found in bed and agreeable to ambulate. Was min-A for bed mobility and SB for ambulation. Grew fatigued with session. At EOS returned to recliner chair with all needs met. Call bell in reach and chair alarm on. NT notified.  Billey Chang Mobility Specialist

## 2023-08-20 NOTE — Discharge Summary (Signed)
Physician Discharge Summary  Andre Stout UVO:[id number] DOB: 1940-12-31 DOA: 08/18/2023  PCP: Olive Bass, FNP  Admit date: 08/18/2023 Discharge date: 08/20/2023  Time spent: 40 minutes  Recommendations for Outpatient Follow-up:  Requires outpatient follow-up with drain clinic CC Dr. Sharrell Ku regarding outpatient follow-up for PPM placement to ensure that this is done Needs Chem-12 CBC in about 1 week Should follow-up with general surgery in the outpatient setting for removal of gallbladder CC General Surgery Tresa Endo Osbourne PA-C /Dr. Corliss Skains to ensure he is not lost to follow-up as drain has been in in over a year Although someone culture at that bile fluid from the cholecystotomy, patient is nonseptic and does not need workup for this-the tube had migrated outwards and is functioning well now and he is not febrile  Discharge Diagnoses:  MAIN problem for hospitalization   Displaced PERC cholecystotomy Complete heart block AKI now resolved resumed meds Prior CVA   Please see below for itemized issues addressed in HOpsital- refer to other progress notes for clarity if needed  Discharge Condition: Improved  Diet recommendation: Heart healthy  Filed Weights   08/18/23 1802  Weight: 77.5 kg    History of present illness:  83 year old lumbee male 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 etc. with prolonged stay until 8/29 Last seen by Dr. Sharrell Ku, EP and was felt at the time to be relatively asymptomatic and suspected that he would ultimately require PPM   Patient has been having some pain at his percutaneous cholecystotomy site for about a month-started having drainage 3 to  4 days ago [according to daughter who is a Engineer, civil (consulting) on the phone when I called her]   Vital stable BUN/creatinine 22/1.3 LFTs normal WBC 8 platelet 270 CT ABD pulling back of PERC cholecystotomy catheter outside gallbladder with leakage around catheter no biloma abscess small gallstones no acute cholecystitis stable right renal cyst  Hospital Course:  Outward migration PERC cholecystotomy Appreciative of IR input-will need outpatient follow-up with them Long discussion with daughter on phone regarding options for management- I do think stepwise fashion may benefit if deemd approp--  1 OP pacemaker placement,   2 interval cholecystectomy if deemed feasible by care team  = Sending home with a small dose of pain meds   Complete heart block Watchful waiting as per last office visit Dr. Ladona Ridgel who I will CC Careful with amlodipine 10 Cut back to 10 mg of lisinopril given mild AKI during hospital stay   AKI on admission-2/2 ACE +/- underlying issues Resolving-force oral fluids recheck labs a.m.   Prior CVA 09/2022 Aspirin Plavix on hold-unclear if indication ongoing for aspirin?  Looks like he was only supposed to be on Plavix monotherapy after the initial stroke back in 09/2022-DC aspirin at time of discharge Resume statin as an outpatient   DM TY 2 Sugars moderately controlled during hospital stay and will resume home meds   Discharge Exam: Vitals:   08/20/23 0503 08/20/23 1153  BP: (!) 131/49 (!) 128/59  Pulse: (!) 58 (!) 52  Resp: 19 18  Temp: 97.6 F (36.4 C) 98.2 F (36.8 C)  SpO2: 95% 97%    Subj on day of d/c   EOMI NCAT no focal deficit sitting up eating lunch no chest pain no fever  no chills Bile tube seems to be working well No lower extremity edema ROM intact   Discharge Instructions   Discharge Instructions     Diet - low sodium heart healthy   Complete by: As directed    Discharge instructions   Complete by: As directed    Cardiology will call you  with follow-up appointment for consideration of pacemaker planning-also you should follow-up with the drain clinic routinely for management of your drain up until you can probably coordinate getting a likely pacemaker placed Continue all your other meds without change   Increase activity slowly   Complete by: As directed       Allergies as of 08/20/2023   No Known Allergies      Medication List     STOP taking these medications    aspirin EC 81 MG tablet       TAKE these medications    Accu-Chek Guide w/Device Kit Use As Directed   acetaminophen 500 MG tablet Commonly known as: TYLENOL Take 1,000 mg by mouth daily as needed (pain).   amLODipine 10 MG tablet Commonly known as: NORVASC Take 1 tablet (10 mg total) by mouth daily.   atorvastatin 40 MG tablet Commonly known as: LIPITOR TAKE 1 TABLET BY MOUTH EVERY DAY What changed: when to take this   BD PosiFlush 0.9 % Soln injection Generic drug: sodium chloride flush Inject 5 mLs into the vein as needed for up to 30 doses. Discard remainder of syringe. What changed: when to take this   brimonidine 0.2 % ophthalmic solution Commonly known as: ALPHAGAN Place 1 drop into both eyes 2 (two) times daily with breakfast and lunch.   clopidogrel 75 MG tablet Commonly known as: PLAVIX TAKE 1 TABLET BY MOUTH EVERY DAY   dorzolamide-timolol 2-0.5 % ophthalmic solution Commonly known as: COSOPT Place 1 drop into both eyes 2 (two) times daily with breakfast and lunch.   FreeStyle Libre 3 Reader [place] 1 each by Does not apply route daily.   FreeStyle Libre 3 Sensor Misc 1 each by Does not apply route every 14 (fourteen) days.   Insulin Syringe-Needle U-100 31G X 5/16" 1 ML Misc Commonly known as: BD Insulin Syringe U/F USE 2 DAILY   latanoprost 0.005 % ophthalmic solution Commonly known as: XALATAN Place 1 drop into both eyes at bedtime.   lisinopril 20 MG tablet Commonly known as: ZESTRIL Take 0.5 tablets (10 mg  total) by mouth daily. What changed: how much to take   magnesium oxide 400 (240 Mg) MG tablet Commonly known as: MAG-OX Take 1 tablet (400 mg total) by mouth 2 (two) times daily. What changed: when to take this   NovoLIN 70/30 (70-30) 100 UNIT/ML injection Generic drug: insulin NPH-regular Human INJECT 65-70 units under skin as advised What changed:  how much to take how to take this when to take this additional instructions   OneTouch Delica Lancets 33G Misc Use to check blood sugar 4 times per day. Dx code: E11.9   OneTouch Ultra test strip Generic drug: glucose blood USE TO MONITOR GLUCOSE LEVELS 4 TIMES PER DAY E11.9   oxyCODONE 5 MG immediate release tablet Commonly known as: Oxy IR/ROXICODONE Take 1-2 tablets (5-10 mg total) by mouth every 4 (four) hours as needed for moderate pain or breakthrough pain.   pantoprazole 40 MG tablet Commonly known as: PROTONIX TAKE 1 TABLET (40 MG TOTAL) BY MOUTH TWICE A DAY BEFORE MEALS   PreserVision AREDS 2 Caps Take 1 capsule  by mouth at bedtime.       No Known Allergies    The results of significant diagnostics from this hospitalization (including imaging, microbiology, ancillary and laboratory) are listed below for reference.    Significant Diagnostic Studies: IR EXCHANGE BILIARY DRAIN  Result Date: 08/19/2023 INDICATION: History of acute cholecystitis, post ultrasound fluoroscopic guided cholecystostomy tube placement on 08/17/2022. Lost to follow-up. EXAM: CHOLECYSTOSTOMY TUBE EXCHANGE, REPOSITIONING AND UPSIZE COMPARISON:  IR fluoroscopy, 08/17/2022.  CT AP, 08/18/2023. MEDICATIONS: None ANESTHESIA/SEDATION: Local anesthetic was administered. CONTRAST:  10mL OMNIPAQUE IOHEXOL 300 MG/ML SOLN - administered into the gallbladder lumen. FLUOROSCOPY TIME:  Fluoroscopic dose; 2 mGy COMPLICATIONS: None immediate. PROCEDURE: The patient was positioned supine on the fluoroscopy table. The external portion of the existing  cholecystostomy tube as well as the surrounding skin was prepped and draped in usual sterile fashion. A time-out was performed prior to the initiation of the procedure. A preprocedural spot fluoroscopic image was obtained of the right upper abdominal quadrant existing cholecystostomy tube. The skin surrounding the cholecystostomy tube was anesthetized with 1% lidocaine with epinephrine. The external portion of the cholecystostomy tube was cut and cannulated with a short Amplatz wire which was advanced through the tube and coiled within the gallbladder lumen. Next, under intermittent fluoroscopic guidance, the existing 10 Fr cholecystostomy tube was exchanged for a new, slightly larger now 12 Fr cholecystostomy tube which was repositioned into the more central aspect of the gallbladder lumen. Contrast injection confirms appropriate positioning and functionality of the cholecystomy tube. The cholecystostomy tube was flushed with a small amount of saline and reconnected to a gravity bag. The cholecystostomy tube was secured with an interrupted suture and a Stat Lock device. A dressing was applied. The patient tolerated the procedure well without immediate postprocedural complication. FINDINGS: Preprocedural spot fluoroscopic image demonstrates unchanged positioning of cholecystostomy tube with end coiled and locked over the expected location of the fundus of the gallbladder After fluoroscopic guided exchange, the new, slightly larger, now 12 Fr cholecystostomy tube is more ideally positioned with end coiled and locked within the central aspect of the gallbladder lumen. Post exchange cholangiogram demonstrates appropriate positioning and functionality of the new cholecystostomy tube. IMPRESSION: Successful fluoroscopic guided exchange, repositioning and up sizing of a now 12 Fr cholecystostomy tube. PLAN: Cardiology clearance and surgical re-evaluation for potential cholecystectomy is ongoing. The patient will return to  Vascular Interventional Radiology (VIR) for routine drainage catheter evaluation and exchange in 6-8 weeks. Roanna Banning, MD [facility] Electronically Signed   By: Roanna Banning M.D.   On: 08/19/2023 17:11   CT ABDOMEN PELVIS W CONTRAST  Result Date: 08/18/2023 CLINICAL DATA:  Right upper quadrant abdominal pain. Patient has a percutaneous cholecystostomy tube in place. EXAM: CT ABDOMEN AND PELVIS WITH CONTRAST TECHNIQUE: Multidetector CT imaging of the abdomen and pelvis was performed using the standard protocol following bolus administration of intravenous contrast. RADIATION DOSE REDUCTION: This exam was performed according to the departmental dose-optimization program which includes automated exposure control, adjustment of the mA and/or kV according to patient size and/or use of iterative reconstruction technique. CONTRAST:  OMNIPAQUE IOHEXOL 300 MG/ML  SOLN COMPARISON:  CT scan 08/14/2022 FINDINGS: Lower chest: The lung bases are clear of acute process. No pleural effusion or pulmonary lesions. The heart is normal in size. No pericardial effusion. Stable aortic and coronary artery calcifications. The distal esophagus and aorta are unremarkable. Hepatobiliary: No hepatic lesions or intrahepatic biliary dilatation. There is a percutaneous cholecystostomy  tube which does not appear to traversing any liver parenchyma. Part of the Cope loop portion of the catheter appears to be outside the gallbladder wall and there may be some leakage around the catheter. Small amount of fluid between the liver and gallbladder. No large biloma or abscess is identified small gallstones are noted but no findings for acute cholecystitis. No common bile duct dilatation. No evidence of hematoma or biloma. Pancreas: Stable severe pancreatic atrophy but no mass or acute inflammation. Spleen: Normal Adrenals/Urinary Tract: The adrenal glands are normal. Stable 10 cm right  renal cyst not requiring any further imaging evaluation follow-up. The bladder is unremarkable. Stomach/Bowel: The stomach, duodenum, small and colon unremarkable. The terminal ileum appendix are normal. Vascular/Lymphatic: Stable atherosclerotic calcifications involving the aorta and branch vessels but no aneurysm or dissection. The major venous structures are patent. Retroaortic left renal vein. No mesenteric or retroperitoneal adenopathy. Reproductive: Brachytherapy seeds are noted in the prostate gland. The seminal vesicles are unremarkable. Other: No pelvic mass or adenopathy. No free pelvic fluid collections. No inguinal mass or adenopathy. No abdominal wall hernia or subcutaneous lesions. Musculoskeletal: No significant bony findings. IMPRESSION: 1. Percutaneous cholecystostomy tube appears to have pulled back. Part of the Cope loop portion of the catheter appears to be outside the gallbladder wall and there may be some leakage around the catheter. Small amount of fluid between the liver and gallbladder. No large biloma or abscess is identified. Interventional radiology consult may be helpful. 2. Small gallstones but no findings for acute cholecystitis. 3. 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 4. Stable 10 cm right renal cyst. Aortic Atherosclerosis (ICD10-I70.0). Electronically Signed   By: Rudie Meyer M.D.   On: 08/18/2023 15:01    Microbiology: Recent Results (from the past 240 hour(s))  Aerobic/Anaerobic Culture w Gram Stain (surgical/deep wound)     Status: None (Preliminary result)   Collection Time: 08/19/23  3:02 PM   Specimen: Wound; Bile  Result Value Ref Range Status   Specimen Description WOUND  Final   Special Requests GALL BLADDER  Final   Gram Stain   Final    FEW WBC PRESENT, PREDOMINANTLY PMN FEW GRAM NEGATIVE RODS FEW GRAM POSITIVE RODS    Culture   Final    CULTURE REINCUBATED FOR BETTER GROWTH Performed at [facility], [address] [place], [place] [address]    Report Status PENDING  Incomplete     Labs: Basic Metabolic Panel: Recent Labs  Lab 08/18/23 1324 08/19/23 0358 08/20/23 0355  NA 138 134* 132*  K 5.0 4.1 3.9  CL 104 103 102  CO2 27 23 23   GLUCOSE 141* 227* 218*  BUN 22 21 17   CREATININE 1.36* 1.17 1.14  CALCIUM 9.8 8.9 8.8*  PHOS  --   --  3.2   Liver Function Tests: Recent Labs  Lab 08/18/23 1324 08/19/23 0358 08/20/23 0355  AST 31 25  --   ALT 39 37  --   ALKPHOS 74 68  --   BILITOT 0.9 1.2  --   PROT 7.3 6.2*  --   ALBUMIN 3.6 3.1* 3.0*   No results for input(s): "LIPASE", "AMYLASE" in the last 168 hours. No results for input(s): "AMMONIA" in the last 168 hours. CBC: Recent Labs  Lab 08/18/23 1324 08/19/23 0358 08/20/23 0355  WBC 8.2 6.9 6.0  NEUTROABS 4.5  --  2.6  HGB 13.4 11.7* 11.9*  HCT 41.8 37.2* 38.2*  MCV 81.8 83.0  83.6  PLT 271 229 240   Cardiac Enzymes: No results for input(s): "CKTOTAL", "CKMB", "CKMBINDEX", "TROPONINI" in the last 168 hours. BNP: BNP (last 3 results) No results for input(s): "BNP" in the last 8760 hours.  ProBNP (last 3 results) No results for input(s): "PROBNP" in the last 8760 hours.  CBG: Recent Labs  Lab 08/19/23 2012 08/19/23 2358 08/20/23 0355 08/20/23 0743 08/20/23 1150  GLUCAP 258* 230* 206* 158* 337*       Signed:  Rhetta Mura MD   [facility] 08/20/2023, 2:31 PM

## 2023-08-20 NOTE — Plan of Care (Signed)
  Problem: Health Behavior/Discharge Planning: Goal: Ability to manage health-related needs will improve Outcome: Progressing   Problem: Clinical Measurements: Goal: Diagnostic test results will improve Outcome: Progressing   Problem: Safety: Goal: Ability to remain free from injury will improve Outcome: Progressing   

## 2023-08-20 NOTE — Plan of Care (Signed)

## 2023-08-21 ENCOUNTER — Telehealth: Payer: Self-pay

## 2023-08-21 LAB — AEROBIC/ANAEROBIC CULTURE W GRAM STAIN (SURGICAL/DEEP WOUND)

## 2023-08-21 NOTE — Transitions of Care (Post Inpatient/ED Visit) (Signed)
08/21/2023  Name: Andre Stout MRN: [id number] DOB: [birth year]  Today's TOC FU Call Status: Today's TOC FU Call Status:: Successful TOC FU Call Completed TOC FU Call Complete Date: 08/21/23  Transition Care Management Follow-up Telephone Call Date of Discharge: 08/20/23 Discharge Facility: [facility]) Type of Discharge: Inpatient Admission Primary Inpatient Discharge Diagnosis:: disease of digestive system How have you been since you were released from the hospital?: Better Any questions or concerns?: No  Items Reviewed: Did you receive and understand the discharge instructions provided?: Yes Medications obtained,verified, and reconciled?: Yes (Medications Reviewed) Any new allergies since your discharge?: No Dietary orders reviewed?: Yes Do you have support at home?: Yes People in Home: child(ren), adult  Medications Reviewed Today: Medications Reviewed Today     Reviewed by Karena Addison, LPN (Licensed Practical Nurse) on 08/21/23 at 1045  Med List Status: <None>   Medication Order Taking? Sig Documenting Provider Last Dose Status Informant  acetaminophen (TYLENOL) 500 MG tablet [id number] No Take 1,000 mg by mouth daily as needed (pain). [provider] unk Active Child, Pharmacy Records           Med Note [facility] Oct 15, 2022  2:01 AM)    amLODipine (NORVASC) 10 MG tablet [id number] No Take 1 tablet (10 mg total) by mouth daily. Olive Bass, FNP 08/18/2023 Active Child, Pharmacy Records  atorvastatin (LIPITOR) 40 MG tablet [id number] No TAKE 1 TABLET BY MOUTH EVERY DAY  Patient taking differently: Take 40 mg by mouth at bedtime.   Olive Bass, FNP 08/17/2023 Active Child, Pharmacy Records  Blood Glucose Monitoring Suppl (ACCU-CHEK GUIDE) w/Device Andria Rhein [id number] No Use As Directed Lanae Boast, MD Taking Active Child, Pharmacy Records  brimonidine [facility]) 0.2 % ophthalmic solution [id number] No Place 1 drop into both eyes 2  (two) times daily with breakfast and lunch. [provider] 08/18/2023 Active Child, Pharmacy Records  clopidogrel (PLAVIX) 75 MG tablet [id number] No TAKE 1 TABLET BY MOUTH EVERY DAY Olive Bass, FNP 08/18/2023 Active Child, Pharmacy Records  Continuous Blood Gluc Receiver (FREESTYLE LIBRE 3 READER) [place] [id number] No 1 each by Does not apply route daily. Carlus Pavlov, MD Taking Active Child, Pharmacy Records  Continuous Blood Gluc Sensor (FREESTYLE LIBRE 3 SENSOR) [place] [id number] No 1 each by Does not apply route every 14 (fourteen) days. Carlus Pavlov, MD Taking Active Child, Pharmacy Records  dorzolamide-timolol (COSOPT) 22.3-6.8 MG/ML ophthalmic solution [id number] No Place 1 drop into both eyes 2 (two) times daily with breakfast and lunch. [provider] 08/18/2023 Active Child, Pharmacy Records  insulin NPH-regular Human (NOVOLIN 70/30) (70-30) 100 UNIT/ML injection [id number] No INJECT 65-70 units under skin as advised  Patient taking differently: Inject 20 Units into the skin 2 (two) times daily with a meal. 8am and 5pm   Carlus Pavlov, MD Taking Active Child, Pharmacy Records           Med Note Lia Hopping, Ty Hilts Aug 18, 2023  7:09 PM)    Insulin Syringe-Needle U-100 (BD INSULIN SYRINGE U/F) 31G X 5/16" 1 ML MISC [id number] No USE 2 DAILY Carlus Pavlov, MD Taking Active Child, Pharmacy Records  latanoprost (XALATAN) 0.005 % ophthalmic solution [id number] No Place 1 drop into both eyes at bedtime. [provider] 08/17/2023 Active Child, Pharmacy Records  lisinopril (ZESTRIL) 20 MG tablet [id number]  Take 0.5 tablets (10 mg total) by mouth daily. Rhetta Mura, MD  Active   magnesium oxide (MAG-OX) 400 (240  Mg) MG tablet [id number] No Take 1 tablet (400 mg total) by mouth 2 (two) times daily.  Patient taking differently: Take 400 mg by mouth every evening.   Olive Bass, FNP 08/17/2023 Active Child, Pharmacy Records            Med Note Lia Hopping, Ty Hilts Aug 18, 2023  7:09 PM)    Multiple Vitamins-Minerals (PRESERVISION AREDS 2) CAPS [id number] No Take 1 capsule by mouth at bedtime. [provider] 08/17/2023 Active Child, Pharmacy Records  [facility] LANCETS 33G [place] [id number] No Use to check blood sugar 4 times per day. Dx code: E11.9 Romero Belling, MD Taking Active Child, Pharmacy Records  [facility] ULTRA test strip [id number] No USE TO MONITOR GLUCOSE LEVELS 4 TIMES PER DAY E11.9 Romero Belling, MD Taking Active Child, Pharmacy Records  oxyCODONE (OXY IR/ROXICODONE) 5 MG immediate release tablet [id number]  Take 1-2 tablets (5-10 mg total) by mouth every 4 (four) hours as needed for moderate pain or breakthrough pain. Rhetta Mura, MD  Active   pantoprazole (PROTONIX) 40 MG tablet [id number] No TAKE 1 TABLET (40 MG TOTAL) BY MOUTH TWICE A DAY BEFORE MEALS Olive Bass, FNP 08/18/2023 Active Child, Pharmacy Records  sodium chloride flush 0.9 % SOLN injection [id number] No Inject 5 mLs into the vein as needed for up to 30 doses. Discard remainder of syringe.  Patient taking differently: Inject 5 mLs into the vein daily.   Lanae Boast, MD 08/18/2023 Active Child, Pharmacy Records            Home Care and Equipment/Supplies: Were Home Health Services Ordered?: NA Any new equipment or medical supplies ordered?: NA  Functional Questionnaire: Do you need assistance with bathing/showering or dressing?: No Do you need assistance with meal preparation?: No Do you need assistance with eating?: No Do you have difficulty maintaining continence: No Do you need assistance with getting out of bed/getting out of a chair/moving?: No  Follow up appointments reviewed: PCP Follow-up appointment confirmed?: NA Specialist Hospital Follow-up appointment confirmed?: No Reason Specialist Follow-Up Not Confirmed: Patient has Specialist Provider Number and will Call for Appointment Do you need  transportation to your follow-up appointment?: No Do you understand care options if your condition(s) worsen?: Yes-patient verbalized understanding    SIGNATURE Karena Addison, LPN [facility] Nurse Health Advisor Direct Dial [phone]

## 2023-09-11 ENCOUNTER — Other Ambulatory Visit: Payer: Self-pay | Admitting: Family

## 2023-09-12 ENCOUNTER — Ambulatory Visit: Payer: Medicare Other | Attending: Internal Medicine | Admitting: Internal Medicine

## 2023-09-13 ENCOUNTER — Encounter: Payer: Self-pay | Admitting: Internal Medicine

## 2023-10-16 ENCOUNTER — Other Ambulatory Visit: Payer: Self-pay | Admitting: Family

## 2023-10-17 ENCOUNTER — Encounter: Payer: Self-pay | Admitting: Internal Medicine

## 2023-10-17 MED ORDER — HUMALOG MIX 75/25 (75-25) 100 UNIT/ML ~~LOC~~ SUSP: SUBCUTANEOUS | 3 refills | Status: DC

## 2023-10-17 NOTE — Telephone Encounter (Signed)
Novolin 70/30 is not at any pharmacies. How would you like to proceed?

## 2023-10-18 ENCOUNTER — Other Ambulatory Visit: Payer: Self-pay | Admitting: Internal Medicine

## 2023-10-18 MED ORDER — INSULIN LISPRO PROT & LISPRO (75-25 MIX) 100 UNIT/ML KWIKPEN: PEN_INJECTOR | SUBCUTANEOUS | 3 refills | Status: DC

## 2023-10-18 NOTE — Addendum Note (Signed)
Addended by: Pollie Meyer on: 10/18/2023 05:07 PM   Modules accepted: Orders

## 2023-11-03 ENCOUNTER — Other Ambulatory Visit: Payer: Self-pay | Admitting: Family

## 2023-11-14 ENCOUNTER — Encounter: Payer: Self-pay | Admitting: Internal Medicine

## 2023-11-14 ENCOUNTER — Telehealth: Payer: Self-pay

## 2023-11-14 ENCOUNTER — Ambulatory Visit: Payer: Medicare Other | Admitting: Internal Medicine

## 2023-11-14 VITALS — BP 122/60 | HR 68 | Ht 65.0 in | Wt 170.6 lb

## 2023-11-14 DIAGNOSIS — E1165 Type 2 diabetes mellitus with hyperglycemia: Secondary | ICD-10-CM | POA: Diagnosis not present

## 2023-11-14 DIAGNOSIS — E1159 Type 2 diabetes mellitus with other circulatory complications: Secondary | ICD-10-CM | POA: Diagnosis not present

## 2023-11-14 DIAGNOSIS — E7849 Other hyperlipidemia: Secondary | ICD-10-CM | POA: Diagnosis not present

## 2023-11-14 DIAGNOSIS — Z794 Long term (current) use of insulin: Secondary | ICD-10-CM

## 2023-11-14 LAB — POCT GLYCOSYLATED HEMOGLOBIN (HGB A1C): Hemoglobin A1C: 10.7 % — AB (ref 4.0–5.6)

## 2023-11-14 LAB — LIPID PANEL
Cholesterol: 115 mg/dL (ref 0–200)
HDL: 48.1 mg/dL (ref >39.00)
LDL Cholesterol: 45 mg/dL (ref 0–99)
NonHDL: 67.12
Total CHOL/HDL Ratio: 2
Triglycerides: 110 mg/dL (ref 0.0–149.0)
VLDL: 22 mg/dL (ref 0.0–40.0)

## 2023-11-14 MED ORDER — INSULIN LISPRO PROT & LISPRO (75-25 MIX) 100 UNIT/ML KWIKPEN: PEN_INJECTOR | SUBCUTANEOUS | 3 refills | Status: DC

## 2023-11-14 NOTE — Telephone Encounter (Signed)
Pt scheduled for an OV 11/15/2023.

## 2023-11-14 NOTE — Patient Instructions (Addendum)
Please change to Humalog 75/25: - 30 units before b'fast - 20 units before lunch - 15 units before dinner Take the insulin 10-15 min before meals   Please return in 1.5 months.

## 2023-11-14 NOTE — Telephone Encounter (Signed)
J, We tried NovoLog 70/30 for him and this was not to be found at any pharmacy.  We then tried Novolin 70/30 and he was not able to obtain it and at today's visit his daughter was rationing whatever he had left so sugars are very high.  She mentioned over the phone (during the visit) that they could not get the Humalog 75/25 insulin because the pharmacy tried to dispense only a 1 mo supply but they needed a prescription for 3 months at the time.  I sent 4 boxes to his pharmacy.  I am at a loss which premixed insulin we can use for him, unfortunately.... We can use any of these, but he needs to get one of them from the pharmacy.

## 2023-11-14 NOTE — Progress Notes (Signed)
Patient ID: Andre Stout, male   DOB: 1940-06-09, 83 y.o.   MRN: [id number]  HPI: Andre Stout is a 83 y.o.-year-old male, returning for follow-up for DM2, dx in 1990, insulin-dependent since 2006, uncontrolled, with complications (CVA, CKD, DR, DKA, hypoglycemia). Pt. previously saw Dr. Everardo All, but last visit with me 4 months ago.   He is here with his son-in-law now.  However, we discussed on the phone with his daughter during the appointment.  She also sent a letter about his insulin dosing, which we reviewed together today.  Interim history: No increased urination, blurry vision, nausea, chest pain.  Reviewed HbA1c: Lab Results  Component Value Date   HGBA1C 10.5 07/11/2023   HGBA1C 9.0 (A) 01/22/2023   HGBA1C 8.4 (H) 10/15/2022   HGBA1C 8.3 (H) 08/02/2022   HGBA1C 8.9 (A) 02/07/2022   HGBA1C 8.2 (H) 01/03/2022   HGBA1C 8.8 (A) 11/17/2021   HGBA1C 10.5 (A) 09/15/2021   HGBA1C 9.4 (A) 06/23/2021   HGBA1C 9.8 (A) 03/17/2021   He is on: - Novolog Insulin 70/30 (vials) - 10-15 min before meals: Now on: - 30 units before b'fast >> 20 units - 20 units before lunch >> - - 15 units before dinner >> 20 units  Pt checks his sugars >4x a day and they are:  Previously:  Prev.: - am: 90-100 or, if eating at night: 200 - 2h after b'fast: 130 if no eating at night - before lunch: 90-120 - 2h after lunch: n/c - before dinner: 200 - 2h after dinner: <200 - bedtime: 100-180 - nighttime: 80-140 Lowest sugar was 75 >> 90s; he has hypoglycemia awareness at 70.  Highest sugar was 300s >> 300s.  Glucometer: One Touch  - + CKD, last BUN/creatinine:  Lab Results  Component Value Date   BUN 17 08/20/2023   BUN 21 08/19/2023   CREATININE 1.14 08/20/2023   CREATININE 1.17 08/19/2023   Lab Results  Component Value Date   MICRALBCREAT 1.2 05/03/2015  On lisinopril 20 mg daily.  -+ HL; last set of lipids: Lab Results  Component Value Date   CHOL 111 10/15/2022   HDL 46  10/15/2022   LDLCALC 38 10/15/2022   LDLDIRECT 121.0 03/25/2019   TRIG 134 10/15/2022   CHOLHDL 2.4 10/15/2022  On Lipitor 40 mg daily.  - last eye exam was in 2023. + DR. + glaucoma. She has a retina specialist also (Dr. Ashley Royalty).  - no numbness and tingling in his feet.  Last foot exam 03/25/2023 by Dr. Irving Shows.  He also has a history of complete heart block, prostate cancer, acute cholecystitis 08/2022.  ROS: + see HPI  Past Medical History:  Diagnosis Date   Diabetes mellitus without complication (HCC)    Glaucoma    Heart block    complete heart block   Hypertension    Prostate cancer (HCC)    Been 3-4 years ago   Past Surgical History:  Procedure Laterality Date   CATARACT EXTRACTION Bilateral    INSERTION PROSTATE RADIATION SEED     IR EXCHANGE BILIARY DRAIN  08/19/2023   IR PERC CHOLECYSTOSTOMY  08/17/2022   IR RADIOLOGIST EVAL & MGMT  09/28/2022   IR RADIOLOGIST EVAL & MGMT  10/10/2022   Social History   Socioeconomic History   Marital status: Widowed    Spouse name: Not on file   Number of children: 1   Years of education: 83   Highest education level: Not on file  Occupational History  Occupation: Retired  Tobacco Use   Smoking status: Former    Current packs/day: 0.00    Average packs/day: 0.5 packs/day for 4.0 years (2.0 ttl pk-yrs)    Types: Cigarettes    Start date: 10/29/1970    Quit date: 10/29/1974    Years since quitting: 49.0   Smokeless tobacco: Never  Vaping Use   Vaping status: Never Used  Substance and Sexual Activity   Alcohol use: No   Drug use: No   Sexual activity: Not Currently  Other Topics Concern   Not on file  Social History Narrative   Born and raised in [place], [place]. Currently resides with his daughter (temporary), but he still has a residence.  No live. Fun: hunt and fish   Denies religious beliefs that would effect health care.    Social Determinants of Health   Financial Resource Strain: Low Risk  (07/24/2023)    Overall Financial Resource Strain (CARDIA)    Difficulty of Paying Living Expenses: Not hard at all  Food Insecurity: No Food Insecurity (08/18/2023)   Hunger Vital Sign    Worried About Running Out of Food in the Last Year: Never true    Ran Out of Food in the Last Year: Never true  Transportation Needs: No Transportation Needs (08/18/2023)   PRAPARE - Administrator, Civil Service (Medical): No    Lack of Transportation (Non-Medical): No  Physical Activity: Inactive (07/24/2023)   Exercise Vital Sign    Days of Exercise per Week: 0 days    Minutes of Exercise per Session: 0 min  Stress: No Stress Concern Present (07/24/2023)   [employer] of Occupational Health - Occupational Stress Questionnaire    Feeling of Stress : Not at all  Social Connections: Socially Isolated (07/24/2023)   Social Connection and Isolation Panel [NHANES]    Frequency of Communication with Friends and Family: Once a week    Frequency of Social Gatherings with Friends and Family: Once a week    Attends Religious Services: Never    Database administrator or Organizations: No    Attends Banker Meetings: Never    Marital Status: Widowed  Intimate Partner Violence: Not At Risk (08/18/2023)   Humiliation, Afraid, Rape, and Kick questionnaire    Fear of Current or Ex-Partner: No    Emotionally Abused: No    Physically Abused: No    Sexually Abused: No   Current Outpatient Medications on File Prior to Visit  Medication Sig Dispense Refill   acetaminophen (TYLENOL) 500 MG tablet Take 1,000 mg by mouth daily as needed (pain).     amLODipine (NORVASC) 10 MG tablet TAKE 1 TABLET BY MOUTH EVERY DAY 90 tablet 0   atorvastatin (LIPITOR) 40 MG tablet TAKE 1 TABLET BY MOUTH EVERY DAY 90 tablet 0   Blood Glucose Monitoring Suppl (ACCU-CHEK GUIDE) w/Device KIT Use As Directed 1 kit 0   brimonidine (ALPHAGAN) 0.2 % ophthalmic solution Place 1 drop into both eyes 2 (two) times daily with breakfast  and lunch.     clopidogrel (PLAVIX) 75 MG tablet TAKE 1 TABLET BY MOUTH EVERY DAY 90 tablet 0   Continuous Blood Gluc Receiver (FREESTYLE LIBRE 3 READER) DEVI 1 each by Does not apply route daily. 1 each 0   Continuous Blood Gluc Sensor (FREESTYLE LIBRE 3 SENSOR) MISC 1 each by Does not apply route every 14 (fourteen) days. 6 each 3   dorzolamide-timolol (COSOPT) 22.3-6.8 MG/ML ophthalmic solution Place 1 drop into  both eyes 2 (two) times daily with breakfast and lunch.     Insulin Lispro Prot & Lispro (HUMALOG MIX 75/25 KWIKPEN) (75-25) 100 UNIT/ML Kwikpen 30 units before b'fast 20 units before lunch 15 units before dinner Take the insulin 10-15 min before meals. 30 mL 3   insulin lispro protamine-lispro (HUMALOG MIX 75/25) (75-25) 100 UNIT/ML SUSP injection 30 units before b'fast 20 units before lunch 15 units before dinner Take the insulin 10-15 min before meals. 30 mL 3   insulin NPH-regular Human (NOVOLIN 70/30) (70-30) 100 UNIT/ML injection INJECT 65-70 units under skin as advised (Patient taking differently: Inject 20 Units into the skin 2 (two) times daily with a meal. 8am and 5pm) 70 mL 1   Insulin Syringe-Needle U-100 (BD INSULIN SYRINGE U/F) 31G X 5/16" 1 ML MISC USE 2 DAILY 200 each 4   latanoprost (XALATAN) 0.005 % ophthalmic solution Place 1 drop into both eyes at bedtime.     lisinopril (ZESTRIL) 20 MG tablet Take 0.5 tablets (10 mg total) by mouth daily. 90 tablet 3   magnesium oxide (MAG-OX) 400 (240 Mg) MG tablet Take 1 tablet (400 mg total) by mouth 2 (two) times daily. (Patient taking differently: Take 400 mg by mouth every evening.) 60 tablet 11   Multiple Vitamins-Minerals (PRESERVISION AREDS 2) CAPS Take 1 capsule by mouth at bedtime.     ONETOUCH DELICA LANCETS 33G MISC Use to check blood sugar 4 times per day. Dx code: E11.9 200 each 2   ONETOUCH ULTRA test strip USE TO MONITOR GLUCOSE LEVELS 4 TIMES PER DAY E11.9 100 each 2   oxyCODONE (OXY IR/ROXICODONE) 5 MG immediate  release tablet Take 1-2 tablets (5-10 mg total) by mouth every 4 (four) hours as needed for moderate pain or breakthrough pain. 30 tablet 0   pantoprazole (PROTONIX) 40 MG tablet Take 1 tablet (40 mg total) by mouth 2 (two) times daily before a meal. 180 tablet 0   sodium chloride flush 0.9 % SOLN injection Inject 5 mLs into the vein as needed for up to 30 doses. Discard remainder of syringe. (Patient taking differently: Inject 5 mLs into the vein daily.) 300 mL 0   No current facility-administered medications on file prior to visit.   No Known Allergies Family History  Problem Relation Age of Onset   Other Mother        unknown medical history   Diabetes Father    Diabetes Paternal Grandfather    PE: BP 122/60   Pulse 68   Ht 5\' 5"  (1.651 m)   Wt 170 lb 9.6 oz (77.4 kg)   SpO2 99%   BMI 28.39 kg/m  Wt Readings from Last 3 Encounters:  11/14/23 170 lb 9.6 oz (77.4 kg)  08/18/23 170 lb 13.7 oz (77.5 kg)  07/24/23 172 lb (78 kg)   Constitutional: normal weight, in NAD, walks with a walker Eyes:  EOMI, no exophthalmos ENT: no neck masses, no cervical lymphadenopathy Cardiovascular: RRR, No MRG, + B LE swelling Respiratory: CTA B Musculoskeletal: no deformities Skin:no rashes Neurological: no tremor with outstretched hands  ASSESSMENT: 1. 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 HL  PLAN:  1. Patient with longstanding, uncontrolled, type 2 diabetes, on premixed insulin regimen, with poor control.  At last visit, we adjusted his insulin doses as his HbA1c was higher, at 10.5%.  At that  time, sugars were very high, increased from prior visit.  They were decreasing overnight, touching the target range but then increasing significantly after breakfast and then even more so after lunch.  After dinner, which was approximately at 7 PM, sugars were  actually dropping and they continued to drop overnight with a nadir around 3 AM.  He did not feel that his insulin was degraded and that she was keeping it in the fridge.  Also, no significant medical issues or steroids.  We increased his dose of premixed insulin before breakfast and also added a dose of insulin before lunch.  Before dinner, I advised him to reduce the insulin dose to avoid nocturnal hypoglycemia.  Since his daughter is giving him the injections, it is not feasible for him to switch to basal/bolus insulin regimen. -At last visit, we discussed about healthier versions of snacks including unsalted nuts, veggies, or certain fruit with lower glycemic index like berries, apples, pears, etc. CGM interpretation: -At today's visit, we reviewed his CGM downloads: It appears that 17% of values are in target range (goal >70%), while 83% are higher than 180 (goal <25%), and 0% are lower than 70 (goal <4%).  The calculated average blood sugar is 269.  The projected HbA1c for the next 3 months (GMI) is 9.7% -Reviewing the CGM trends, sugars are still very high, increasing abruptly after lunch and remaining elevated throughout the night, but slightly better after 3 AM, with another increase after breakfast. -Upon discussion with daughter over the phone, patient was not able to get the 75/25 Humalog mix due to the fact that pharmacy tried to dispense only 1 month supply and insurance did not cover this... Therefore, they have been trying to stretch his NovoLog 70/30 supply.  Currently he is actually getting approximately 50% of the doses recommended at last visit.  He is now on 20 units NovoLog 70/30 before breakfast and 20 units before dinner.  This is definitely not enough.  We will increase his dose in the morning to 30 units, add a 20 unit dose before lunch and decrease his dose before dinner to avoid low blood sugars overnight.  I also sent 4 boxes of Humalog pens to his pharmacy which should cover him  for several months.  I advised him to let me know if he is not able to get this. - I suggested to:  Patient Instructions  Please change to Humalog 75/25: - 30 units before b'fast - 20 units before lunch - 15 units before dinner Take the insulin 10-15 min before meals   Please return in 1.5 months.  - we checked his HbA1c: 10.7% (even higher) - advised to check sugars at different times of the day - 4x a day, rotating check times - advised for yearly eye exams >> he is UTD - will check an ACR today - return to clinic in 1.5 months  2. HL -Reviewed latest lipid panel: All fractions at goal: Lab Results  Component Value Date   CHOL 111 10/15/2022   HDL 46 10/15/2022   LDLCALC 38 10/15/2022   LDLDIRECT 121.0 03/25/2019   TRIG 134 10/15/2022   CHOLHDL 2.4 10/15/2022  -He continues on Lipitor 40 mg daily without side effects -He is due for another lipid panel -will check today  Component     Latest Ref Rng 11/14/2023  Cholesterol     0 - 200 mg/dL 604   Triglycerides     0.0 - 149.0 mg/dL 540.9   HDL  Cholesterol     >39.00 mg/dL 67.34   VLDL     0.0 - 40.0 mg/dL 19.3   Total CHOL/HDL Ratio 2   NonHDL 67.12   LDL (calc)     0 - 99 mg/dL 45   LDL and the rest of the lipid panel at goal. Patient was not able to give urine sample for ACR  Carlus Pavlov, MD PhD [facility]

## 2023-11-14 NOTE — Telephone Encounter (Signed)
Patient daughter called stating that his insurance does not cover Humalog and it needs to be switched to Novolog. OK for switch? Or proceed with PA?

## 2023-11-15 ENCOUNTER — Other Ambulatory Visit: Payer: Medicare Other

## 2023-11-15 ENCOUNTER — Encounter: Payer: Self-pay | Admitting: Family

## 2023-11-15 ENCOUNTER — Ambulatory Visit (INDEPENDENT_AMBULATORY_CARE_PROVIDER_SITE_OTHER): Payer: Medicare Other | Admitting: Family

## 2023-11-15 VITALS — BP 132/80 | HR 62 | Ht 65.0 in | Wt 170.2 lb

## 2023-11-15 DIAGNOSIS — G459 Transient cerebral ischemic attack, unspecified: Secondary | ICD-10-CM

## 2023-11-15 DIAGNOSIS — Z23 Encounter for immunization: Secondary | ICD-10-CM | POA: Diagnosis not present

## 2023-11-15 DIAGNOSIS — K219 Gastro-esophageal reflux disease without esophagitis: Secondary | ICD-10-CM | POA: Diagnosis not present

## 2023-11-15 DIAGNOSIS — E785 Hyperlipidemia, unspecified: Secondary | ICD-10-CM

## 2023-11-15 DIAGNOSIS — I10 Essential (primary) hypertension: Secondary | ICD-10-CM

## 2023-11-15 DIAGNOSIS — E1169 Type 2 diabetes mellitus with other specified complication: Secondary | ICD-10-CM | POA: Diagnosis not present

## 2023-11-15 MED ORDER — AMLODIPINE BESYLATE 10 MG PO TABS: 10.0000 mg | ORAL_TABLET | Freq: Every day | ORAL | 3 refills | Status: DC

## 2023-11-15 MED ORDER — ATORVASTATIN CALCIUM 40 MG PO TABS: 40.0000 mg | ORAL_TABLET | Freq: Every day | ORAL | 3 refills | Status: DC

## 2023-11-15 MED ORDER — CLOPIDOGREL BISULFATE 75 MG PO TABS: 75.0000 mg | ORAL_TABLET | Freq: Every day | ORAL | 3 refills | Status: DC

## 2023-11-15 MED ORDER — INSULIN PEN NEEDLE 32G X 4 MM MISC: 3 refills | Status: DC

## 2023-11-15 MED ORDER — PANTOPRAZOLE SODIUM 40 MG PO TBEC: 40.0000 mg | DELAYED_RELEASE_TABLET | Freq: Two times a day (BID) | ORAL | 3 refills | Status: DC

## 2023-11-15 MED ORDER — NOVOLOG 70/30 FLEXPEN RELION (70-30) 100 UNIT/ML ~~LOC~~ SUPN: PEN_INJECTOR | SUBCUTANEOUS | 4 refills | Status: DC

## 2023-11-15 NOTE — Addendum Note (Signed)
Addended by: Pollie Meyer on: 11/15/2023 02:36 PM   Modules accepted: Orders

## 2023-11-15 NOTE — Progress Notes (Signed)
Andre Stout is a 83 y.o. male with the following history as recorded in Epic[employer]:  Patient Active Problem List   Diagnosis Date Noted   Migration of percutaneous cholecystostomy tube 08/18/2023   Hypertension associated with diabetes (HCC) 08/18/2023   History of CVA (cerebrovascular accident) 08/18/2023   Hyperlipidemia associated with type 2 diabetes mellitus (HCC) 08/18/2023   TIA (transient ischemic attack) 10/15/2022   Odynophagia 08/21/2022   Hyponatremia 08/20/2022   Obesity (BMI 30-39.9) 08/20/2022   Myocardial injury 08/20/2022   DKA, type 2 (HCC)    Acute cholecystitis 08/14/2022   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   Diarrhea 07/31/2022   Seizure (HCC) 01/02/2022   Abnormal EKG 01/02/2022   Acute hypokalemia 01/02/2022   Insulin dependent type 2 diabetes mellitus (HCC) 09/15/2021   Syncope and collapse 10/09/2020   Prostate cancer (HCC) 03/25/2019   Hypophosphatemia 03/06/2016   Hypomagnesemia 03/06/2016   Influenza A 03/05/2016   Benign essential HTN 03/05/2016   AKI (acute kidney injury) (HCC) 03/04/2016   Routine general medical examination at a health care facility 05/02/2015   Medicare annual wellness visit, subsequent 05/02/2015    Current Outpatient Medications  Medication Sig Dispense Refill   acetaminophen (TYLENOL) 500 MG tablet Take 1,000 mg by mouth daily as needed (pain).     Blood Glucose Monitoring Suppl (ACCU-CHEK GUIDE) w/Device KIT Use As Directed 1 kit 0   brimonidine (ALPHAGAN) 0.2 % ophthalmic solution Place 1 drop into both eyes 2 (two) times daily with breakfast and lunch.     Continuous Blood Gluc Receiver (FREESTYLE LIBRE 3 READER) DEVI 1 each by Does not apply route daily. 1 each 0   Continuous Blood Gluc Sensor (FREESTYLE LIBRE 3 SENSOR) MISC 1 each by Does not apply route every 14 (fourteen) days. 6 each 3   dorzolamide-timolol (COSOPT) 22.3-6.8 MG/ML ophthalmic solution Place 1 drop into both eyes 2 (two) times daily with  breakfast and lunch.     insulin aspart protamine - aspart (NOVOLOG 70/30 FLEXPEN) (70-30) 100 UNIT/ML FlexPen 30 units before b'fast 20 units before lunch 15 units before dinner Take the insulin 10-15 min before meals. 75 mL 4   Insulin Pen Needle 32G X 4 MM MISC Use to inject insulin 3 times a day 300 each 3   Insulin Syringe-Needle U-100 (BD INSULIN SYRINGE U/F) 31G X 5/16" 1 ML MISC USE 2 DAILY 200 each 4   latanoprost (XALATAN) 0.005 % ophthalmic solution Place 1 drop into both eyes at bedtime.     lisinopril (ZESTRIL) 20 MG tablet Take 0.5 tablets (10 mg total) by mouth daily. 90 tablet 3   magnesium oxide (MAG-OX) 400 (240 Mg) MG tablet Take 1 tablet (400 mg total) by mouth 2 (two) times daily. (Patient taking differently: Take 400 mg by mouth every evening.) 60 tablet 11   Multiple Vitamins-Minerals (PRESERVISION AREDS 2) CAPS Take 1 capsule by mouth at bedtime.     ONETOUCH DELICA LANCETS 33G MISC Use to check blood sugar 4 times per day. Dx code: E11.9 200 each 2   ONETOUCH ULTRA test strip USE TO MONITOR GLUCOSE LEVELS 4 TIMES PER DAY E11.9 100 each 2   sodium chloride flush 0.9 % SOLN injection Inject 5 mLs into the vein as needed for up to 30 doses. Discard remainder of syringe. (Patient taking differently: Inject 5 mLs into the vein daily.) 300 mL 0   amLODipine (NORVASC) 10 MG tablet Take 1 tablet (10 mg total) by mouth daily. 90  tablet 3   atorvastatin (LIPITOR) 40 MG tablet Take 1 tablet (40 mg total) by mouth daily. 90 tablet 3   clopidogrel (PLAVIX) 75 MG tablet Take 1 tablet (75 mg total) by mouth daily. 90 tablet 3   pantoprazole (PROTONIX) 40 MG tablet Take 1 tablet (40 mg total) by mouth 2 (two) times daily before a meal. 180 tablet 3   No current facility-administered medications for this visit.    Allergies: Patient has no known allergies.  Past Medical History:  Diagnosis Date   Diabetes mellitus without complication (HCC)    Glaucoma    Heart block    complete heart  block   Hypertension    Prostate cancer (HCC)    Been 3-4 years ago    Past Surgical History:  Procedure Laterality Date   CATARACT EXTRACTION Bilateral    INSERTION PROSTATE RADIATION SEED     IR EXCHANGE BILIARY DRAIN  08/19/2023   IR PERC CHOLECYSTOSTOMY  08/17/2022   IR RADIOLOGIST EVAL & MGMT  09/28/2022   IR RADIOLOGIST EVAL & MGMT  10/10/2022    Family History  Problem Relation Age of Onset   Other Mother        unknown medical history   Diabetes Father    Diabetes Paternal Grandfather     Social History   Tobacco Use   Smoking status: Former    Current packs/day: 0.00    Average packs/day: 0.5 packs/day for 4.0 years (2.0 ttl pk-yrs)    Types: Cigarettes    Start date: 10/29/1970    Quit date: 10/29/1974    Years since quitting: 49.0   Smokeless tobacco: Never  Substance Use Topics   Alcohol use: No    Subjective:   Seen for yearly follow up for chronic care needs; did see his endocrinologist yesterday for diabetes follow up; no acute concerns today; needed to make sure refills updated;  Patient is seen alone today; Denies any chest pain, shortness of breath, blurred vision or headache;   Objective:  Vitals:   11/15/23 1311  BP: 132/80  Pulse: 62  SpO2: 94%  Weight: 170 lb 3.2 oz (77.2 kg)  Height: 5\' 5"  (1.651 m)    General: Well developed, well nourished, in no acute distress  Skin : Warm and dry.  Head: Normocephalic and atraumatic  Eyes: Sclera and conjunctiva clear; pupils round and reactive to light; extraocular movements intact  Ears: External normal; canals clear; tympanic membranes normal  Oropharynx: Pink, supple. No suspicious lesions  Neck: Supple without thyromegaly, adenopathy  Lungs: Respirations unlabored; clear to auscultation bilaterally without wheeze, rales, rhonchi  CVS exam: normal rate and regular rhythm.  Neurologic: Alert and oriented; speech intact; face symmetrical; uses walker;  Assessment:  1. Benign essential HTN   2.  TIA (transient ischemic attack)   3. Gastroesophageal reflux disease, unspecified whether esophagitis present   4. Hyperlipidemia associated with type 2 diabetes mellitus (HCC)     Plan:  Stable; refill updated;  Flu shot updated;  I did talk to patient about notes from hospitalization earlier this year that recommended he follow up with his surgeon about drain. He thinks he has an appointment coming up soon but will share information with his daughter. He will call back if he needs help to get appointment scheduled.   No follow-ups on file.  No orders of the defined types were placed in this encounter.   Requested Prescriptions   Signed Prescriptions Disp Refills   amLODipine (NORVASC) 10  MG tablet 90 tablet 3    Sig: Take 1 tablet (10 mg total) by mouth daily.   atorvastatin (LIPITOR) 40 MG tablet 90 tablet 3    Sig: Take 1 tablet (40 mg total) by mouth daily.   clopidogrel (PLAVIX) 75 MG tablet 90 tablet 3    Sig: Take 1 tablet (75 mg total) by mouth daily.   pantoprazole (PROTONIX) 40 MG tablet 180 tablet 3    Sig: Take 1 tablet (40 mg total) by mouth 2 (two) times daily before a meal.

## 2023-11-15 NOTE — Telephone Encounter (Signed)
Requested Prescriptions   Signed Prescriptions Disp Refills   insulin aspart protamine - aspart (NOVOLOG 70/30 FLEXPEN) (70-30) 100 UNIT/ML FlexPen 75 mL 4    Sig: 30 units before b'fast 20 units before lunch 15 units before dinner Take the insulin 10-15 min before meals.    Authorizing Provider: Carlus Pavlov    Ordering User: Hyacinth Meeker, Zach Tietje S   Insulin Pen Needle 32G X 4 MM MISC 300 each 3    Sig: Use to inject insulin 3 times a day    Authorizing Provider: Carlus Pavlov    Ordering User: Pollie Meyer

## 2023-11-15 NOTE — Patient Instructions (Addendum)
From your hospital note earlier this year: "Should follow-up with general surgery in the outpatient setting for removal of gallbladder CC General Surgery Tresa Endo Osbourne PA-C /Dr. Corliss Skains to ensure he is not lost to follow-up as drain has been in in over a year-"  Please check with your daughter to make sure the follow up with the surgeon has been scheduled; let us know if we need to help you get this set up

## 2023-11-16 LAB — MICROALBUMIN / CREATININE URINE RATIO
Creatinine, Urine: 93.7 mg/dL
Microalb/Creat Ratio: 17 mg/g{creat} (ref 0–29)
Microalbumin, Urine: 15.7 ug/mL

## 2023-11-26 ENCOUNTER — Ambulatory Visit: Payer: Medicare Other | Admitting: Family

## 2023-11-27 ENCOUNTER — Encounter: Payer: Self-pay | Admitting: Family Medicine

## 2023-11-27 ENCOUNTER — Ambulatory Visit (INDEPENDENT_AMBULATORY_CARE_PROVIDER_SITE_OTHER): Payer: Medicare Other | Admitting: Family Medicine

## 2023-11-27 VITALS — BP 127/62 | HR 66 | Temp 97.4°F | Ht 65.0 in | Wt 170.0 lb

## 2023-11-27 DIAGNOSIS — L89311 Pressure ulcer of right buttock, stage 1: Secondary | ICD-10-CM

## 2023-11-27 NOTE — Patient Instructions (Signed)
Stage 1 Pressure Ulcer: - Most important thing is relieving pressure/friction, and monitoring the wound for change - Off-loading and repositioning every hour with pillows - Sacral foam pad to help relieve direct pressure (example in picture below - [employer], pharmacy, medical supply store, etc) - Keep area clean - Make sure you are staying hydrated and eating adequate protein - Monitor the area daily for any changes

## 2023-11-27 NOTE — Progress Notes (Signed)
   Acute Office Visit  Subjective:     Patient ID: ERICKSON KOCIAN, male    DOB: 11/02/1940, 83 y.o.   MRN: [id number]  Chief Complaint  Patient presents with   Skin Problem    HPI Patient is in today for sore on buttocks.   Discussed the use of AI scribe software for clinical note transcription with the patient, who gave verbal consent to proceed.  History of Present Illness   Mr. Guide presents with a chief complaint of a sore on the buttocks. The sore has been present for approximately one week. The patient is unable to visualize the sore and denies any noticeable drainage. He spends most of the day sitting, using a walker for mobility.  The patient has never experienced a similar sore in the past. He lives at his daughter's house. The patient reports discomfort from the sore primarily when sitting for extended periods. He denies any significant drainage in his underwear from the sore.            ROS All review of systems negative except what is listed in the HPI      Objective:    BP 127/62   Pulse 66   Temp (!) 97.4 F (36.3 C) (Oral)   Ht 5\' 5"  (1.651 m)   Wt 170 lb (77.1 kg)   SpO2 100%   BMI 28.29 kg/m    Physical Exam Vitals reviewed. Exam conducted with a chaperone present.  Constitutional:      Appearance: Normal appearance.  Skin:    Comments: See picture. Non-blanching, open lesion, no induration, warmth, drainage  Neurological:     Mental Status: He is alert and oriented to person, place, and time.  Psychiatric:        Mood and Affect: Mood normal.        Behavior: Behavior normal.        Thought Content: Thought content normal.        Judgment: Judgment normal.          No results found for any visits on 11/27/23.      Assessment & Plan:   Problem List Items Addressed This Visit   None Visit Diagnoses     Pressure injury of right buttock, stage 1    -  Primary      - Most important thing is relieving pressure/friction,  and monitoring the wound for change - Off-loading and repositioning every hour with pillows - Sacral foam pad to help relieve direct pressure ([employer], pharmacy, medical supply store, etc) - Keep area clean - Make sure you are staying hydrated and eating adequate protein - Monitor the area daily for any changes No orders of the defined types were placed in this encounter.   Return if symptoms worsen or fail to improve.  Clayborne Dana, NP

## 2023-12-04 ENCOUNTER — Telehealth: Payer: Medicare Other | Admitting: Physician Assistant

## 2023-12-04 ENCOUNTER — Encounter: Payer: Self-pay | Admitting: Physician Assistant

## 2023-12-04 DIAGNOSIS — H9202 Otalgia, left ear: Secondary | ICD-10-CM

## 2023-12-04 DIAGNOSIS — U071 COVID-19: Secondary | ICD-10-CM

## 2023-12-04 MED ORDER — PREDNISONE 20 MG PO TABS
20.0000 mg | ORAL_TABLET | Freq: Every day | ORAL | 0 refills | Status: DC
Start: 2023-12-04 — End: 2023-12-09

## 2023-12-04 MED ORDER — AZELASTINE HCL 0.1 % NA SOLN
1.0000 | Freq: Two times a day (BID) | NASAL | 0 refills | Status: DC
Start: 2023-12-04 — End: 2023-12-31

## 2023-12-04 NOTE — Progress Notes (Signed)
MyChart Video Visit    Virtual Visit via Video Note   This format is felt to be most appropriate for this patient at this time. Physical exam was limited by quality of the video and audio technology used for the visit.   Patient location: home Provider location: [email or web address]  I discussed the limitations of evaluation and management by telemedicine and the availability of in person appointments. The patient expressed understanding and agreed to proceed.  Patient: Andre Stout   DOB: Dec 24, 1940   83 y.o. Male  MRN: [id number] Visit Date: 12/04/2023  Today's healthcare provider: Alfredia Ferguson, PA-C   Cc. Covid 19, left ear pain, headache  Subjective    HPI  Pt presents after testing positive for COVID 19 on Sunday at an urgent care. He reports continued L ear pain and headache. Slight cough, denies SOB. Reports temps at home < 100 F. BS at home ranging 180-190.    Medications: Outpatient Medications Prior to Visit  Medication Sig   acetaminophen (TYLENOL) 500 MG tablet Take 1,000 mg by mouth daily as needed (pain).   amLODipine (NORVASC) 10 MG tablet Take 1 tablet (10 mg total) by mouth daily.   atorvastatin (LIPITOR) 40 MG tablet Take 1 tablet (40 mg total) by mouth daily.   Blood Glucose Monitoring Suppl (ACCU-CHEK GUIDE) w/Device KIT Use As Directed   brimonidine (ALPHAGAN) 0.2 % ophthalmic solution Place 1 drop into both eyes 2 (two) times daily with breakfast and lunch.   clopidogrel (PLAVIX) 75 MG tablet Take 1 tablet (75 mg total) by mouth daily.   Continuous Blood Gluc Receiver (FREESTYLE LIBRE 3 READER) DEVI 1 each by Does not apply route daily.   Continuous Blood Gluc Sensor (FREESTYLE LIBRE 3 SENSOR) MISC 1 each by Does not apply route every 14 (fourteen) days.   dorzolamide-timolol (COSOPT) 22.3-6.8 MG/ML ophthalmic solution Place 1 drop into both eyes 2 (two) times daily with breakfast and lunch.   insulin aspart protamine - aspart (NOVOLOG 70/30 FLEXPEN) (70-30)  100 UNIT/ML FlexPen 30 units before b'fast 20 units before lunch 15 units before dinner Take the insulin 10-15 min before meals.   Insulin Pen Needle 32G X 4 MM MISC Use to inject insulin 3 times a day   Insulin Syringe-Needle U-100 (BD INSULIN SYRINGE U/F) 31G X 5/16" 1 ML MISC USE 2 DAILY   latanoprost (XALATAN) 0.005 % ophthalmic solution Place 1 drop into both eyes at bedtime.   lisinopril (ZESTRIL) 20 MG tablet Take 0.5 tablets (10 mg total) by mouth daily.   Multiple Vitamins-Minerals (PRESERVISION AREDS 2) CAPS Take 1 capsule by mouth at bedtime.   ONETOUCH DELICA LANCETS 33G MISC Use to check blood sugar 4 times per day. Dx code: E11.9   ONETOUCH ULTRA test strip USE TO MONITOR GLUCOSE LEVELS 4 TIMES PER DAY E11.9   pantoprazole (PROTONIX) 40 MG tablet Take 1 tablet (40 mg total) by mouth 2 (two) times daily before a meal.   sodium chloride flush 0.9 % SOLN injection Inject 5 mLs into the vein as needed for up to 30 doses. Discard remainder of syringe. (Patient taking differently: Inject 5 mLs into the vein daily.)   No facility-administered medications prior to visit.    Review of Systems  Constitutional:  Negative for fatigue and fever.  HENT:  Positive for ear pain.   Respiratory:  Positive for cough. Negative for shortness of breath.   Cardiovascular:  Negative for chest pain, palpitations and leg swelling.  Neurological:  Positive for headaches. Negative for dizziness.        Objective    There were no vitals taken for this visit.      Physical Exam Constitutional:      Appearance: Normal appearance. He is not ill-appearing.  Neurological:     Mental Status: He is oriented to person, place, and time.  Psychiatric:        Mood and Affect: Mood normal.        Behavior: Behavior normal.        Assessment & Plan     1. COVID 2. Left ear pain Rx prednsione x 5 days, advised pt monitor BS carfeully  Rx azelastine nasal spray, recommend saline spray otc Stay  hydrated, tylenol for pain  If symptoms persist past prednisone therapy recommend in person appt for eval  - predniSONE (DELTASONE) 20 MG tablet; Take 1 tablet (20 mg total) by mouth daily with breakfast.  Dispense: 5 tablet; Refill: 0 - azelastine (ASTELIN) 0.1 % nasal spray; Place 1 spray into both nostrils 2 (two) times daily. Use in each nostril as directed  Dispense: 30 mL; Refill: 0   Return if symptoms worsen or fail to improve.     I discussed the assessment and treatment plan with the patient. The patient was provided an opportunity to ask questions and all were answered. The patient agreed with the plan and demonstrated an understanding of the instructions.   The patient was advised to call back or seek an in-person evaluation if the symptoms worsen or if the condition fails to improve as anticipated.  I provided 7 minutes of non-face-to-face time during this encounter.  Alfredia Ferguson, PA-C [facility] [phone] (phone) [phone] (fax)  [facility]

## 2023-12-09 ENCOUNTER — Telehealth (INDEPENDENT_AMBULATORY_CARE_PROVIDER_SITE_OTHER): Payer: Medicare Other | Admitting: Family Medicine

## 2023-12-09 ENCOUNTER — Encounter: Payer: Self-pay | Admitting: Family Medicine

## 2023-12-09 VITALS — Temp 97.3°F | Ht 65.0 in | Wt 170.0 lb

## 2023-12-09 DIAGNOSIS — H9202 Otalgia, left ear: Secondary | ICD-10-CM | POA: Diagnosis not present

## 2023-12-09 MED ORDER — AMOXICILLIN-POT CLAVULANATE 875-125 MG PO TABS
1.0000 | ORAL_TABLET | Freq: Two times a day (BID) | ORAL | 0 refills | Status: AC
Start: 2023-12-09 — End: 2023-12-16

## 2023-12-09 NOTE — Progress Notes (Signed)
Virtual Video Visit via MyChart Note  I connected with  Andre Stout on 12/09/23 at  3:00 PM EST by the video enabled telemedicine application for MyChart, and verified that I am speaking with the correct person using two identifiers.   I introduced myself as a Publishing rights manager with the practice. We discussed the limitations of evaluation and management by telemedicine and the availability of in person appointments. The patient expressed understanding and agreed to proceed.  Participating parties in this visit include: The patient and the nurse practitioner listed and daughter. The patient is: At home I am: In the office - [facility]  Subjective:    CC:  Chief Complaint  Patient presents with   Ear Problem    HPI: Andre Stout is a 83 y.o. year old male presenting today via MyChart today for left ear pain.  Discussed the use of AI scribe software for clinical note transcription with the patient, who gave verbal consent to proceed.  History of Present Illness   The patient, recently diagnosed with COVID-19, with severe left-sided otalgia. The pain, described as sharp and stabbing, was severe enough to cause nocturnal awakenings. The patient also reported associated left-sided facial and jaw pain. There was no reported otorrhea or bleeding from the ear. The patient denied any fever, although a low-grade temperature of 99.2 was noted.  The patient had previously had virtual visit for the earache, where he was prescribed a course of steroids and a nasal spray. Despite completing the course of treatment, the patient reported no relief in symptoms and developed new-onset hearing loss in the affected ear. Over-the-counter analgesics such as Tylenol provided only temporary relief.  The patient had no known allergies and had not been on any recent antibiotics.             Past medical history, Surgical history, Family history not pertinant except as  noted below, Social history, Allergies, and medications have been entered into the medical record, reviewed, and corrections made.   Review of Systems:  All review of systems negative except what is listed in the HPI   Objective:    General:  Speaking clearly in complete sentences. Absent shortness of breath noted.   Alert and oriented x3.   Normal judgment.  Absent acute distress. Holding left ear due to pain.     Impression and Recommendations:    Problem List Items Addressed This Visit   None Visit Diagnoses     Left ear pain    -  Primary   Relevant Medications   amoxicillin-clavulanate (AUGMENTIN) 875-125 MG tablet     Left Ear Pain Severe, sharp, stabbing pain with associated hearing loss. No drainage or bleeding. No fever. Previously treated with prednisone and nasal spray with no relief. Possible otitis media. -Start antibiotic therapy. -Continue Tylenol for pain relief. -Apply warm compress for additional pain relief. -If no improvement, patient needs to be seen in person for further evaluation.           Follow-up if symptoms worsen or fail to improve.    I discussed the assessment and treatment plan with the patient. The patient was provided an opportunity to ask questions and all were answered. The patient agreed with the plan and demonstrated an understanding of the instructions.   The patient was advised to call back or seek an in-person evaluation if the symptoms worsen or if the condition fails to improve as anticipated.   Lollie Marrow  Reola Calkins, NP

## 2023-12-17 ENCOUNTER — Ambulatory Visit (INDEPENDENT_AMBULATORY_CARE_PROVIDER_SITE_OTHER): Payer: Medicare Other | Admitting: Family

## 2023-12-17 VITALS — BP 98/64 | HR 62 | Ht 65.0 in | Wt 168.4 lb

## 2023-12-17 DIAGNOSIS — H9193 Unspecified hearing loss, bilateral: Secondary | ICD-10-CM | POA: Diagnosis not present

## 2023-12-17 DIAGNOSIS — T85528A Displacement of other gastrointestinal prosthetic devices, implants and grafts, initial encounter: Secondary | ICD-10-CM

## 2023-12-17 MED ORDER — MAGNESIUM OXIDE -MG SUPPLEMENT 400 (240 MG) MG PO TABS: 400.0000 mg | ORAL_TABLET | Freq: Every evening | ORAL | 6 refills | Status: DC

## 2023-12-17 MED ORDER — AMOXICILLIN-POT CLAVULANATE 875-125 MG PO TABS: 1.0000 | ORAL_TABLET | Freq: Two times a day (BID) | ORAL | 0 refills | Status: AC

## 2023-12-17 MED ORDER — BENZONATATE 100 MG PO CAPS: 100.0000 mg | ORAL_CAPSULE | Freq: Three times a day (TID) | ORAL | 0 refills | Status: DC | PRN

## 2023-12-17 MED ORDER — PREDNISONE 20 MG PO TABS: 20.0000 mg | ORAL_TABLET | Freq: Every day | ORAL | 0 refills | Status: DC

## 2023-12-17 NOTE — Progress Notes (Signed)
Andre Stout is a 83 y.o. male with the following history as recorded in EpicCare:  Patient Active Problem List   Diagnosis Date Noted   Migration of percutaneous cholecystostomy tube 08/18/2023   Hypertension associated with diabetes (HCC) 08/18/2023   History of CVA (cerebrovascular accident) 08/18/2023   Hyperlipidemia associated with type 2 diabetes mellitus (HCC) 08/18/2023   TIA (transient ischemic attack) 10/15/2022   Odynophagia 08/21/2022   Hyponatremia 08/20/2022   Obesity (BMI 30-39.9) 08/20/2022   Myocardial injury 08/20/2022   DKA, type 2 (HCC)    Acute cholecystitis 08/14/2022   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   Diarrhea 07/31/2022   Seizure (HCC) 01/02/2022   Abnormal EKG 01/02/2022   Acute hypokalemia 01/02/2022   Insulin dependent type 2 diabetes mellitus (HCC) 09/15/2021   Syncope and collapse 10/09/2020   Prostate cancer (HCC) 03/25/2019   Hypophosphatemia 03/06/2016   Hypomagnesemia 03/06/2016   Influenza A 03/05/2016   Benign essential HTN 03/05/2016   AKI (acute kidney injury) (HCC) 03/04/2016   Routine general medical examination at a health care facility 05/02/2015   Medicare annual wellness visit, subsequent 05/02/2015    Current Outpatient Medications  Medication Sig Dispense Refill   acetaminophen (TYLENOL) 500 MG tablet Take 1,000 mg by mouth daily as needed (pain).     amLODipine (NORVASC) 10 MG tablet Take 1 tablet (10 mg total) by mouth daily. 90 tablet 3   amoxicillin-clavulanate (AUGMENTIN) 875-125 MG tablet Take 1 tablet by mouth 2 (two) times daily for 3 days. 6 tablet 0   atorvastatin (LIPITOR) 40 MG tablet Take 1 tablet (40 mg total) by mouth daily. 90 tablet 3   azelastine (ASTELIN) 0.1 % nasal spray Place 1 spray into both nostrils 2 (two) times daily. Use in each nostril as directed 30 mL 0   Blood Glucose Monitoring Suppl (ACCU-CHEK GUIDE) w/Device KIT Use As Directed 1 kit 0   brimonidine (ALPHAGAN) 0.2 % ophthalmic solution  Place 1 drop into both eyes 2 (two) times daily with breakfast and lunch.     clopidogrel (PLAVIX) 75 MG tablet Take 1 tablet (75 mg total) by mouth daily. 90 tablet 3   Continuous Blood Gluc Receiver (FREESTYLE LIBRE 3 READER) DEVI 1 each by Does not apply route daily. 1 each 0   Continuous Blood Gluc Sensor (FREESTYLE LIBRE 3 SENSOR) MISC 1 each by Does not apply route every 14 (fourteen) days. 6 each 3   dorzolamide-timolol (COSOPT) 22.3-6.8 MG/ML ophthalmic solution Place 1 drop into both eyes 2 (two) times daily with breakfast and lunch.     insulin aspart protamine - aspart (NOVOLOG 70/30 FLEXPEN) (70-30) 100 UNIT/ML FlexPen 30 units before b'fast 20 units before lunch 15 units before dinner Take the insulin 10-15 min before meals. 75 mL 4   Insulin Pen Needle 32G X 4 MM MISC Use to inject insulin 3 times a day 300 each 3   Insulin Syringe-Needle U-100 (BD INSULIN SYRINGE U/F) 31G X 5/16" 1 ML MISC USE 2 DAILY 200 each 4   latanoprost (XALATAN) 0.005 % ophthalmic solution Place 1 drop into both eyes at bedtime.     lisinopril (ZESTRIL) 20 MG tablet Take 0.5 tablets (10 mg total) by mouth daily. 90 tablet 3   Multiple Vitamins-Minerals (PRESERVISION AREDS 2) CAPS Take 1 capsule by mouth at bedtime.     ONETOUCH DELICA LANCETS 33G MISC Use to check blood sugar 4 times per day. Dx code: E11.9 200 each 2   ONETOUCH ULTRA  test strip USE TO MONITOR GLUCOSE LEVELS 4 TIMES PER DAY E11.9 100 each 2   pantoprazole (PROTONIX) 40 MG tablet Take 1 tablet (40 mg total) by mouth 2 (two) times daily before a meal. 180 tablet 3   predniSONE (DELTASONE) 20 MG tablet Take 1 tablet (20 mg total) by mouth daily with breakfast. 5 tablet 0   sodium chloride flush 0.9 % SOLN injection Inject 5 mLs into the vein as needed for up to 30 doses. Discard remainder of syringe. (Patient taking differently: Inject 5 mLs into the vein daily.) 300 mL 0   No current facility-administered medications for this visit.     Allergies: Patient has no known allergies.  Past Medical History:  Diagnosis Date   Diabetes mellitus without complication (HCC)    Glaucoma    Heart block    complete heart block   Hypertension    Prostate cancer (HCC)    Been 3-4 years ago    Past Surgical History:  Procedure Laterality Date   CATARACT EXTRACTION Bilateral    INSERTION PROSTATE RADIATION SEED     IR EXCHANGE BILIARY DRAIN  08/19/2023   IR PERC CHOLECYSTOSTOMY  08/17/2022   IR RADIOLOGIST EVAL & MGMT  09/28/2022   IR RADIOLOGIST EVAL & MGMT  10/10/2022    Family History  Problem Relation Age of Onset   Other Mother        unknown medical history   Diabetes Father    Diabetes Paternal Grandfather     Social History   Tobacco Use   Smoking status: Former    Current packs/day: 0.00    Average packs/day: 0.5 packs/day for 4.0 years (2.0 ttl pk-yrs)    Types: Cigarettes    Start date: 10/29/1970    Quit date: 10/29/1974    Years since quitting: 49.1   Smokeless tobacco: Never  Substance Use Topics   Alcohol use: No    Subjective:   Patient accompanied by family member who helps provide history- Diagnosed with COVID on 12/01/23 and has 2 subsequent visits for left ear pain; treated with prednisone initially and then Augmentin x 7 days; concerned about persisting hearing difficulty- notes that things sound "low and muffled;"  Objective:  Vitals:   12/17/23 1027  BP: 98/64  Pulse: 62  SpO2: 92%  Weight: 168 lb 6.4 oz (76.4 kg)  Height: 5\' 5"  (1.651 m)    General: Well developed, well nourished, in no acute distress  Skin : Warm and dry.  Head: Normocephalic and atraumatic  Eyes: Sclera and conjunctiva clear; cerumen impaction right ear/ congestion in left ear;  Oropharynx: Pink, supple. No suspicious lesions  Neck: Supple without thyromegaly, adenopathy  Lungs: Respirations unlabored; clear to auscultation bilaterally without wheeze, rales, rhonchi  CVS exam: normal rate and regular rhythm.   Neurologic: Alert and oriented; speech intact; face symmetrical; moves all extremities well; CNII-XII intact without focal deficit   Assessment:  1. Bilateral hearing loss, unspecified hearing loss type   2. Migration of percutaneous cholecystostomy tube     Plan:  Attempted ear lavage of right ear- unsuccessful and patient requested to stop procedure; had already planned to refer to ENT due to persisting ear issues/ hearing loss and will plan to have ear lavage completed there. Again reviewed with family that hospitalist wanted patient to see his surgeon when he was discharged in August; patient had previously told me that he thought he had appointment but do not see that anything has been scheduled; referral  updated to his surgeon.   No follow-ups on file.  Orders Placed This Encounter  Procedures   Ambulatory referral to ENT    Referral Priority:   Routine    Referral Type:   Consultation    Referral Reason:   Specialty Services Required    Requested Specialty:   Otolaryngology    Number of Visits Requested:   1   Ambulatory referral to General Surgery    Referral Priority:   Routine    Referral Type:   Surgical    Referral Reason:   Specialty Services Required    Referred to Provider:   Manus Rudd, MD    Requested Specialty:   General Surgery    Number of Visits Requested:   1    Requested Prescriptions   Signed Prescriptions Disp Refills   amoxicillin-clavulanate (AUGMENTIN) 875-125 MG tablet 6 tablet 0    Sig: Take 1 tablet by mouth 2 (two) times daily for 3 days.   predniSONE (DELTASONE) 20 MG tablet 5 tablet 0    Sig: Take 1 tablet (20 mg total) by mouth daily with breakfast.

## 2023-12-18 ENCOUNTER — Other Ambulatory Visit (HOSPITAL_COMMUNITY): Payer: Self-pay | Admitting: Radiology

## 2023-12-18 ENCOUNTER — Encounter (HOSPITAL_COMMUNITY): Payer: Self-pay | Admitting: Internal Medicine

## 2023-12-18 DIAGNOSIS — K8001 Calculus of gallbladder with acute cholecystitis with obstruction: Secondary | ICD-10-CM

## 2023-12-18 DIAGNOSIS — K810 Acute cholecystitis: Secondary | ICD-10-CM

## 2023-12-19 ENCOUNTER — Other Ambulatory Visit (HOSPITAL_COMMUNITY): Payer: Self-pay | Admitting: Radiology

## 2023-12-19 ENCOUNTER — Ambulatory Visit (HOSPITAL_COMMUNITY)
Admission: RE | Admit: 2023-12-19 | Discharge: 2023-12-19 | Disposition: A | Discharge status: Home / Self Care | Payer: Medicare Other | Source: Ambulatory Visit | Attending: Radiology | Admitting: Radiology

## 2023-12-19 DIAGNOSIS — K810 Acute cholecystitis: Secondary | ICD-10-CM | POA: Insufficient documentation

## 2023-12-19 DIAGNOSIS — Z434 Encounter for attention to other artificial openings of digestive tract: Secondary | ICD-10-CM | POA: Diagnosis present

## 2023-12-19 HISTORY — PX: IR EXCHANGE BILIARY DRAIN: IMG6046

## 2023-12-19 MED ORDER — LIDOCAINE HCL 1 % IJ SOLN
20.0000 mL | Freq: Once | INTRAMUSCULAR | Status: AC
  Administered 2023-12-19: 4 mL via INTRADERMAL

## 2023-12-19 MED ORDER — IOHEXOL 300 MG/ML  SOLN
50.0000 mL | Freq: Once | INTRAMUSCULAR | Status: AC | PRN
  Administered 2023-12-19: 15 mL

## 2023-12-19 MED ORDER — LIDOCAINE HCL 1 % IJ SOLN
INTRAMUSCULAR | Status: AC
  Filled 2023-12-19: qty 20

## 2023-12-23 ENCOUNTER — Encounter: Payer: Self-pay | Admitting: Family

## 2023-12-31 ENCOUNTER — Other Ambulatory Visit: Payer: Self-pay | Admitting: Physician Assistant

## 2023-12-31 DIAGNOSIS — H9202 Otalgia, left ear: Secondary | ICD-10-CM

## 2024-01-03 ENCOUNTER — Ambulatory Visit: Payer: Medicare Other | Admitting: Internal Medicine

## 2024-01-03 NOTE — Progress Notes (Deleted)
 Patient ID: Andre Stout, male   DOB: Apr 24, 1940, 84 y.o.   MRN: [id number]  HPI: Andre Stout is a 84 y.o.-year-old male, returning for follow-up for DM2, dx in 1990, insulin -dependent since 2006, uncontrolled, with complications (CVA, CKD, DR, DKA, hypoglycemia). Pt. previously saw Dr. Kassie, but last visit with me 2 months ago.   He is here with his son-in-law now.  However, we discussed on the phone with his daughter during the appointment.  She also sent a letter about his insulin  dosing, which we reviewed together today.  Interim history: No increased urination, blurry vision, nausea, chest pain.  Reviewed HbA1c: Lab Results  Component Value Date   HGBA1C 10.7 (A) 11/14/2023   HGBA1C 10.5 07/11/2023   HGBA1C 9.0 (A) 01/22/2023   HGBA1C 8.4 (H) 10/15/2022   HGBA1C 8.3 (H) 08/02/2022   HGBA1C 8.9 (A) 02/07/2022   HGBA1C 8.2 (H) 01/03/2022   HGBA1C 8.8 (A) 11/17/2021   HGBA1C 10.5 (A) 09/15/2021   HGBA1C 9.4 (A) 06/23/2021   He is on: - Novolog  Insulin  70/30 (vials) - 10-15 min before meals: Now on: - 30 units before b'fast - 20 units before lunch - 15 units before dinner  Pt checks his sugars >4x a day and they are:  Prev.:  Previously:   Lowest sugar was 75 >> 90s; he has hypoglycemia awareness at 70.  Highest sugar was 300s >> 300s.  Glucometer: One Touch  - + CKD, last BUN/creatinine:  Lab Results  Component Value Date   BUN 17 08/20/2023   BUN 21 08/19/2023   CREATININE 1.14 08/20/2023   CREATININE 1.17 08/19/2023   Lab Results  Component Value Date   MICRALBCREAT 17 11/15/2023   MICRALBCREAT 1.2 05/03/2015  On lisinopril  20 mg daily.  -+ HL; last set of lipids: Lab Results  Component Value Date   CHOL 115 11/14/2023   HDL 48.10 11/14/2023   LDLCALC 45 11/14/2023   LDLDIRECT 121.0 03/25/2019   TRIG 110.0 11/14/2023   CHOLHDL 2 11/14/2023  On Lipitor 40 mg daily.  - last eye exam was in 2023. + DR. + glaucoma. She has a retina specialist  also (Dr. Alvia).  - no numbness and tingling in his feet.  Last foot exam 03/25/2023 by Dr. Scherrie.  He also has a history of complete heart block, prostate cancer, acute cholecystitis 08/2022.  ROS: + see HPI  Past Medical History:  Diagnosis Date   Diabetes mellitus without complication (HCC)    Glaucoma    Heart block    complete heart block   Hypertension    Prostate cancer (HCC)    Been 3-4 years ago   Past Surgical History:  Procedure Laterality Date   CATARACT EXTRACTION Bilateral    INSERTION PROSTATE RADIATION SEED     IR EXCHANGE BILIARY DRAIN  08/19/2023   IR EXCHANGE BILIARY DRAIN  12/19/2023   IR PERC CHOLECYSTOSTOMY  08/17/2022   IR RADIOLOGIST EVAL & MGMT  09/28/2022   IR RADIOLOGIST EVAL & MGMT  10/10/2022   Social History   Socioeconomic History   Marital status: Widowed    Spouse name: Not on file   Number of children: 1   Years of education: 31   Highest education level: Not on file  Occupational History   Occupation: Retired  Tobacco Use   Smoking status: Former    Current packs/day: 0.00    Average packs/day: 0.5 packs/day for 4.0 years (2.0 ttl pk-yrs)    Types: Cigarettes  Start date: 10/29/1970    Quit date: 10/29/1974    Years since quitting: 49.2   Smokeless tobacco: Never  Vaping Use   Vaping status: Never Used  Substance and Sexual Activity   Alcohol use: No   Drug use: No   Sexual activity: Not Currently  Other Topics Concern   Not on file  Social History Narrative   Born and raised in [place], [place]. Currently resides with his daughter (temporary), but he still has a residence.  No live. Fun: hunt and fish   Denies religious beliefs that would effect health care.    Social Drivers of Corporate Investment Banker Strain: Low Risk  (07/24/2023)   Overall Financial Resource Strain (CARDIA)    Difficulty of Paying Living Expenses: Not hard at all  Food Insecurity: No Food Insecurity (08/18/2023)   Hunger Vital Sign    Worried  About Running Out of Food in the Last Year: Never true    Ran Out of Food in the Last Year: Never true  Transportation Needs: No Transportation Needs (08/18/2023)   PRAPARE - Administrator, Civil Service (Medical): No    Lack of Transportation (Non-Medical): No  Physical Activity: Inactive (07/24/2023)   Exercise Vital Sign    Days of Exercise per Week: 0 days    Minutes of Exercise per Session: 0 min  Stress: No Stress Concern Present (07/24/2023)   [employer] of Occupational Health - Occupational Stress Questionnaire    Feeling of Stress : Not at all  Social Connections: Socially Isolated (07/24/2023)   Social Connection and Isolation Panel [NHANES]    Frequency of Communication with Friends and Family: Once a week    Frequency of Social Gatherings with Friends and Family: Once a week    Attends Religious Services: Never    Database Administrator or Organizations: No    Attends Banker Meetings: Never    Marital Status: Widowed  Intimate Partner Violence: Not At Risk (08/18/2023)   Humiliation, Afraid, Rape, and Kick questionnaire    Fear of Current or Ex-Partner: No    Emotionally Abused: No    Physically Abused: No    Sexually Abused: No   Current Outpatient Medications on File Prior to Visit  Medication Sig Dispense Refill   acetaminophen  (TYLENOL ) 500 MG tablet Take 1,000 mg by mouth daily as needed (pain).     amLODipine  (NORVASC ) 10 MG tablet Take 1 tablet (10 mg total) by mouth daily. 90 tablet 3   atorvastatin  (LIPITOR) 40 MG tablet Take 1 tablet (40 mg total) by mouth daily. 90 tablet 3   Azelastine  HCl 137 MCG/SPRAY SOLN PLACE 1 SPRAY INTO BOTH NOSTRILS 2 (TWO) TIMES DAILY. USE IN EACH NOSTRIL AS DIRECTED 30 mL 1   benzonatate  (TESSALON ) 100 MG capsule Take 1 capsule (100 mg total) by mouth 3 (three) times daily as needed for cough. 30 capsule 0   Blood Glucose Monitoring Suppl (ACCU-CHEK GUIDE) w/Device KIT Use As Directed 1 kit 0    brimonidine  (ALPHAGAN ) 0.2 % ophthalmic solution Place 1 drop into both eyes 2 (two) times daily with breakfast and lunch.     clopidogrel  (PLAVIX ) 75 MG tablet Take 1 tablet (75 mg total) by mouth daily. 90 tablet 3   Continuous Blood Gluc Receiver (FREESTYLE LIBRE 3 READER) DEVI 1 each by Does not apply route daily. 1 each 0   Continuous Blood Gluc Sensor (FREESTYLE LIBRE 3 SENSOR) MISC 1 each by Does not  apply route every 14 (fourteen) days. 6 each 3   dorzolamide -timolol  (COSOPT ) 22.3-6.8 MG/ML ophthalmic solution Place 1 drop into both eyes 2 (two) times daily with breakfast and lunch.     insulin  aspart protamine - aspart (NOVOLOG  70/30 FLEXPEN) (70-30) 100 UNIT/ML FlexPen 30 units before b'fast 20 units before lunch 15 units before dinner Take the insulin  10-15 min before meals. 75 mL 4   Insulin  Pen Needle 32G X 4 MM MISC Use to inject insulin  3 times a day 300 each 3   Insulin  Syringe-Needle U-100 (BD INSULIN  SYRINGE U/F) 31G X 5/16 1 ML MISC USE 2 DAILY 200 each 4   latanoprost  (XALATAN ) 0.005 % ophthalmic solution Place 1 drop into both eyes at bedtime.     lisinopril  (ZESTRIL ) 20 MG tablet Take 0.5 tablets (10 mg total) by mouth daily. 90 tablet 3   magnesium  oxide (MAG-OX) 400 (240 Mg) MG tablet Take 1 tablet (400 mg total) by mouth every evening. 30 tablet 6   Multiple Vitamins-Minerals (PRESERVISION AREDS 2) CAPS Take 1 capsule by mouth at bedtime.     ONETOUCH DELICA LANCETS 33G MISC Use to check blood sugar 4 times per day. Dx code: E11.9 200 each 2   ONETOUCH ULTRA test strip USE TO MONITOR GLUCOSE LEVELS 4 TIMES PER DAY E11.9 100 each 2   pantoprazole  (PROTONIX ) 40 MG tablet Take 1 tablet (40 mg total) by mouth 2 (two) times daily before a meal. 180 tablet 3   predniSONE  (DELTASONE ) 20 MG tablet Take 1 tablet (20 mg total) by mouth daily with breakfast. 5 tablet 0   sodium chloride  flush 0.9 % SOLN injection Inject 5 mLs into the vein as needed for up to 30 doses. Discard  remainder of syringe. (Patient taking differently: Inject 5 mLs into the vein daily.) 300 mL 0   No current facility-administered medications on file prior to visit.   No Known Allergies Family History  Problem Relation Age of Onset   Other Mother        unknown medical history   Diabetes Father    Diabetes Paternal Grandfather    PE: There were no vitals taken for this visit. Wt Readings from Last 3 Encounters:  12/17/23 168 lb 6.4 oz (76.4 kg)  12/09/23 170 lb (77.1 kg)  11/27/23 170 lb (77.1 kg)   Constitutional: normal weight, in NAD, walks with a walker Eyes:  EOMI, no exophthalmos ENT: no neck masses, no cervical lymphadenopathy Cardiovascular: RRR, No MRG, + B LE swelling Respiratory: CTA B Musculoskeletal: no deformities Skin:no rashes Neurological: no tremor with outstretched hands  ASSESSMENT: 1. 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 HL  PLAN:  1. Patient with longstanding uncontrolled, DM2, on premixed insulin  regimen, with poor control.  Unfortunately, we cannot change her regimen from premixed insulin  to basal-bolus insulin  regimen as her daughter is giving him the injections.  At last visit, HbA1c was higher, at 10.5% and he had edema higher HbA1c obtained 1.5 months ago, at 10.7%. -We previously discussed about healthier versions of snacks including unsalted nuts, veggies, or certain fruit with lower glycemic index like berries, apples, pears, etc. -At last visit, sugars were still very high, increasing abruptly after lunch, and remaining elevated throughout the night, with slightly better values after 3 AM but another increase after breakfast.  They had problems obtaining the premixed insulin   from the pharmacy.  We tried to change to the Humalog  75/25 insulin  but we then had to go back to the NovoLog  70/30.  We adjusted the dose  at last visit. CGM interpretation: -At today's visit, we reviewed his CGM downloads: It appears that *** of values are in target range (goal >70%), while *** are higher than 180 (goal <25%), and *** are lower than 70 (goal <4%).  The calculated average blood sugar is ***.  The projected HbA1c for the next 3 months (GMI) is ***. -Reviewing the CGM trends, ***  - I suggested to:  Patient Instructions  Please continue NovoLog  70/30: - 30 units before b'fast - 20 units before lunch - 15 units before dinner Take the insulin  10-15 min before meals   Please return in 1.5-2 months.  - we checked his HbA1c: 7%  - advised to check sugars at different times of the day - 4x a day, rotating check times - advised for yearly eye exams >> he is UTD - return to clinic in 1.5-2 months  2. HL Latest lipid panel was reviewed from 11/2023: All fractions at goal: Lab Results  Component Value Date   CHOL 115 11/14/2023   HDL 48.10 11/14/2023   LDLCALC 45 11/14/2023   LDLDIRECT 121.0 03/25/2019   TRIG 110.0 11/14/2023   CHOLHDL 2 11/14/2023  -he continues on Lipitor 40 mg daily w/o SEs  Lela Fendt, MD PhD [facility]

## 2024-01-09 ENCOUNTER — Other Ambulatory Visit: Payer: Self-pay | Admitting: Family

## 2024-01-17 ENCOUNTER — Inpatient Hospital Stay (HOSPITAL_COMMUNITY)
Admission: EM | Admit: 2024-01-17 | Discharge: 2024-01-22 | DRG: 637 | Disposition: A | Discharge status: Home / Self Care | Payer: Medicare Other | Attending: Internal Medicine | Admitting: Internal Medicine

## 2024-01-17 ENCOUNTER — Inpatient Hospital Stay (HOSPITAL_COMMUNITY)
Admission: RE | Admit: 2024-01-17 | Discharge: 2024-01-17 | Disposition: A | Discharge status: Home / Self Care | Payer: Medicare Other | Source: Ambulatory Visit | Attending: Interventional Radiology | Admitting: Interventional Radiology

## 2024-01-17 ENCOUNTER — Encounter (HOSPITAL_COMMUNITY): Payer: Self-pay

## 2024-01-17 ENCOUNTER — Emergency Department (HOSPITAL_COMMUNITY): Payer: Medicare Other

## 2024-01-17 ENCOUNTER — Other Ambulatory Visit: Payer: Self-pay

## 2024-01-17 DIAGNOSIS — D72829 Elevated white blood cell count, unspecified: Secondary | ICD-10-CM | POA: Diagnosis not present

## 2024-01-17 DIAGNOSIS — G9340 Encephalopathy, unspecified: Secondary | ICD-10-CM

## 2024-01-17 DIAGNOSIS — Z79899 Other long term (current) drug therapy: Secondary | ICD-10-CM | POA: Diagnosis not present

## 2024-01-17 DIAGNOSIS — N3000 Acute cystitis without hematuria: Secondary | ICD-10-CM | POA: Diagnosis present

## 2024-01-17 DIAGNOSIS — G9341 Metabolic encephalopathy: Secondary | ICD-10-CM | POA: Diagnosis present

## 2024-01-17 DIAGNOSIS — N390 Urinary tract infection, site not specified: Secondary | ICD-10-CM | POA: Diagnosis present

## 2024-01-17 DIAGNOSIS — N179 Acute kidney failure, unspecified: Principal | ICD-10-CM | POA: Diagnosis present

## 2024-01-17 DIAGNOSIS — Z794 Long term (current) use of insulin: Secondary | ICD-10-CM | POA: Diagnosis not present

## 2024-01-17 DIAGNOSIS — I10 Essential (primary) hypertension: Secondary | ICD-10-CM | POA: Diagnosis present

## 2024-01-17 DIAGNOSIS — E785 Hyperlipidemia, unspecified: Secondary | ICD-10-CM

## 2024-01-17 DIAGNOSIS — B951 Streptococcus, group B, as the cause of diseases classified elsewhere: Secondary | ICD-10-CM | POA: Diagnosis present

## 2024-01-17 DIAGNOSIS — Z8546 Personal history of malignant neoplasm of prostate: Secondary | ICD-10-CM | POA: Diagnosis not present

## 2024-01-17 DIAGNOSIS — Z7982 Long term (current) use of aspirin: Secondary | ICD-10-CM

## 2024-01-17 DIAGNOSIS — E119 Type 2 diabetes mellitus without complications: Secondary | ICD-10-CM | POA: Diagnosis not present

## 2024-01-17 DIAGNOSIS — S62397A Other fracture of fifth metacarpal bone, left hand, initial encounter for closed fracture: Secondary | ICD-10-CM

## 2024-01-17 DIAGNOSIS — E11649 Type 2 diabetes mellitus with hypoglycemia without coma: Secondary | ICD-10-CM | POA: Diagnosis present

## 2024-01-17 DIAGNOSIS — Z434 Encounter for attention to other artificial openings of digestive tract: Secondary | ICD-10-CM | POA: Diagnosis not present

## 2024-01-17 DIAGNOSIS — I442 Atrioventricular block, complete: Secondary | ICD-10-CM | POA: Diagnosis not present

## 2024-01-17 DIAGNOSIS — Z6827 Body mass index (BMI) 27.0-27.9, adult: Secondary | ICD-10-CM

## 2024-01-17 DIAGNOSIS — Z87891 Personal history of nicotine dependence: Secondary | ICD-10-CM | POA: Diagnosis not present

## 2024-01-17 DIAGNOSIS — S62307A Unspecified fracture of fifth metacarpal bone, left hand, initial encounter for closed fracture: Secondary | ICD-10-CM | POA: Diagnosis present

## 2024-01-17 DIAGNOSIS — R053 Chronic cough: Secondary | ICD-10-CM | POA: Diagnosis present

## 2024-01-17 DIAGNOSIS — E871 Hypo-osmolality and hyponatremia: Secondary | ICD-10-CM | POA: Diagnosis not present

## 2024-01-17 DIAGNOSIS — R7881 Bacteremia: Secondary | ICD-10-CM | POA: Diagnosis present

## 2024-01-17 DIAGNOSIS — W19XXXA Unspecified fall, initial encounter: Secondary | ICD-10-CM | POA: Diagnosis present

## 2024-01-17 DIAGNOSIS — Z7902 Long term (current) use of antithrombotics/antiplatelets: Secondary | ICD-10-CM

## 2024-01-17 DIAGNOSIS — E1110 Type 2 diabetes mellitus with ketoacidosis without coma: Secondary | ICD-10-CM | POA: Diagnosis not present

## 2024-01-17 DIAGNOSIS — E876 Hypokalemia: Secondary | ICD-10-CM | POA: Diagnosis present

## 2024-01-17 DIAGNOSIS — E440 Moderate protein-calorie malnutrition: Secondary | ICD-10-CM | POA: Diagnosis present

## 2024-01-17 DIAGNOSIS — Z8673 Personal history of transient ischemic attack (TIA), and cerebral infarction without residual deficits: Secondary | ICD-10-CM | POA: Diagnosis not present

## 2024-01-17 DIAGNOSIS — Z833 Family history of diabetes mellitus: Secondary | ICD-10-CM | POA: Diagnosis not present

## 2024-01-17 LAB — URINALYSIS, ROUTINE W REFLEX MICROSCOPIC
Bilirubin Urine: NEGATIVE
Glucose, UA: >=500 mg/dL — AB
Ketones, ur: 5 mg/dL — AB
Nitrite: NEGATIVE
Protein, ur: 30 mg/dL — AB
RBC / HPF: >50 RBC/hpf (ref 0–5)
Specific Gravity, Urine: 1.016 (ref 1.005–1.030)
WBC, UA: >50 WBC/hpf (ref 0–5)
pH: 5 (ref 5.0–8.0)

## 2024-01-17 LAB — LIPASE, BLOOD: Lipase: 22 U/L (ref 11–51)

## 2024-01-17 LAB — I-STAT VENOUS BLOOD GAS, ED
Acid-Base Excess: 0 mmol/L (ref 0.0–2.0)
Bicarbonate: 25.4 mmol/L (ref 20.0–28.0)
Calcium, Ion: 1.21 mmol/L (ref 1.15–1.40)
HCT: 43 % (ref 39.0–52.0)
Hemoglobin: 14.6 g/dL (ref 13.0–17.0)
O2 Saturation: 52 %
Potassium: 5.8 mmol/L — ABNORMAL HIGH (ref 3.5–5.1)
Sodium: 129 mmol/L — ABNORMAL LOW (ref 135–145)
TCO2: 27 mmol/L (ref 22–32)
pCO2, Ven: 43.3 mm[Hg] — ABNORMAL LOW (ref 44–60)
pH, Ven: 7.377 (ref 7.25–7.43)
pO2, Ven: 29 mm[Hg] — CL (ref 32–45)

## 2024-01-17 LAB — TSH: TSH: 2.023 u[IU]/mL (ref 0.350–4.500)

## 2024-01-17 LAB — CBG MONITORING, ED
Glucose-Capillary: 135 mg/dL — ABNORMAL HIGH (ref 70–99)
Glucose-Capillary: 229 mg/dL — ABNORMAL HIGH (ref 70–99)
Glucose-Capillary: 258 mg/dL — ABNORMAL HIGH (ref 70–99)
Glucose-Capillary: 274 mg/dL — ABNORMAL HIGH (ref 70–99)
Glucose-Capillary: 281 mg/dL — ABNORMAL HIGH (ref 70–99)
Glucose-Capillary: 287 mg/dL — ABNORMAL HIGH (ref 70–99)
Glucose-Capillary: 408 mg/dL — ABNORMAL HIGH (ref 70–99)

## 2024-01-17 LAB — CBC
HCT: 40.8 % (ref 39.0–52.0)
Hemoglobin: 13.3 g/dL (ref 13.0–17.0)
MCH: 26.2 pg (ref 26.0–34.0)
MCHC: 32.6 g/dL (ref 30.0–36.0)
MCV: 80.5 fL (ref 80.0–100.0)
Platelets: 571 10*3/uL — ABNORMAL HIGH (ref 150–400)
RBC: 5.07 MIL/uL (ref 4.22–5.81)
RDW: 16.1 % — ABNORMAL HIGH (ref 11.5–15.5)
WBC: 19.3 10*3/uL — ABNORMAL HIGH (ref 4.0–10.5)
nRBC: 0 % (ref 0.0–0.2)

## 2024-01-17 LAB — BASIC METABOLIC PANEL
Anion gap: 17 — ABNORMAL HIGH (ref 5–15)
Anion gap: 16 — ABNORMAL HIGH (ref 5–15)
BUN: 51 mg/dL — ABNORMAL HIGH (ref 8–23)
BUN: 49 mg/dL — ABNORMAL HIGH (ref 8–23)
CO2: 22 mmol/L (ref 22–32)
CO2: 21 mmol/L — ABNORMAL LOW (ref 22–32)
Calcium: 10 mg/dL (ref 8.9–10.3)
Calcium: 9.9 mg/dL (ref 8.9–10.3)
Chloride: 93 mmol/L — ABNORMAL LOW (ref 98–111)
Chloride: 95 mmol/L — ABNORMAL LOW (ref 98–111)
Creatinine, Ser: 1.81 mg/dL — ABNORMAL HIGH (ref 0.61–1.24)
Creatinine, Ser: 1.82 mg/dL — ABNORMAL HIGH (ref 0.61–1.24)
GFR, Estimated: 37 mL/min — ABNORMAL LOW (ref >60)
GFR, Estimated: 36 mL/min — ABNORMAL LOW (ref >60)
Glucose, Bld: 257 mg/dL — ABNORMAL HIGH (ref 70–99)
Glucose, Bld: 267 mg/dL — ABNORMAL HIGH (ref 70–99)
Potassium: 4.7 mmol/L (ref 3.5–5.1)
Potassium: 4.1 mmol/L (ref 3.5–5.1)
Sodium: 132 mmol/L — ABNORMAL LOW (ref 135–145)
Sodium: 132 mmol/L — ABNORMAL LOW (ref 135–145)

## 2024-01-17 LAB — COMPREHENSIVE METABOLIC PANEL
ALT: 13 U/L (ref 0–44)
AST: 18 U/L (ref 15–41)
Albumin: 2.8 g/dL — ABNORMAL LOW (ref 3.5–5.0)
Alkaline Phosphatase: 106 U/L (ref 38–126)
Anion gap: 19 — ABNORMAL HIGH (ref 5–15)
BUN: 52 mg/dL — ABNORMAL HIGH (ref 8–23)
CO2: 20 mmol/L — ABNORMAL LOW (ref 22–32)
Calcium: 10.1 mg/dL (ref 8.9–10.3)
Chloride: 89 mmol/L — ABNORMAL LOW (ref 98–111)
Creatinine, Ser: 2.08 mg/dL — ABNORMAL HIGH (ref 0.61–1.24)
GFR, Estimated: 31 mL/min — ABNORMAL LOW (ref >60)
Glucose, Bld: 424 mg/dL — ABNORMAL HIGH (ref 70–99)
Potassium: 5 mmol/L (ref 3.5–5.1)
Sodium: 128 mmol/L — ABNORMAL LOW (ref 135–145)
Total Bilirubin: 2.1 mg/dL — ABNORMAL HIGH (ref 0.0–1.2)
Total Protein: 7.5 g/dL (ref 6.5–8.1)

## 2024-01-17 LAB — I-STAT CHEM 8, ED
BUN: 72 mg/dL — ABNORMAL HIGH (ref 8–23)
Calcium, Ion: 1.18 mmol/L (ref 1.15–1.40)
Chloride: 95 mmol/L — ABNORMAL LOW (ref 98–111)
Creatinine, Ser: 1.9 mg/dL — ABNORMAL HIGH (ref 0.61–1.24)
Glucose, Bld: 426 mg/dL — ABNORMAL HIGH (ref 70–99)
HCT: 42 % (ref 39.0–52.0)
Hemoglobin: 14.3 g/dL (ref 13.0–17.0)
Potassium: 5.8 mmol/L — ABNORMAL HIGH (ref 3.5–5.1)
Sodium: 128 mmol/L — ABNORMAL LOW (ref 135–145)
TCO2: 25 mmol/L (ref 22–32)

## 2024-01-17 LAB — MAGNESIUM: Magnesium: 1.9 mg/dL (ref 1.7–2.4)

## 2024-01-17 LAB — VITAMIN B12: Vitamin B-12: 331 pg/mL (ref 180–914)

## 2024-01-17 LAB — BETA-HYDROXYBUTYRIC ACID: Beta-Hydroxybutyric Acid: 3.24 mmol/L — ABNORMAL HIGH (ref 0.05–0.27)

## 2024-01-17 LAB — AMMONIA: Ammonia: 19 umol/L (ref 9–35)

## 2024-01-17 LAB — PHOSPHORUS: Phosphorus: 3.1 mg/dL (ref 2.5–4.6)

## 2024-01-17 MED ORDER — SODIUM CHLORIDE 0.9 % IV BOLUS
1000.0000 mL | Freq: Once | INTRAVENOUS | Status: AC
  Administered 2024-01-17: 1000 mL via INTRAVENOUS

## 2024-01-17 MED ORDER — DEXTROSE 50 % IV SOLN: 0.0000 mL | INTRAVENOUS | Status: DC | PRN

## 2024-01-17 MED ORDER — DEXTROSE IN LACTATED RINGERS 5 % IV SOLN: INTRAVENOUS | Status: DC

## 2024-01-17 MED ORDER — ONDANSETRON HCL 4 MG PO TABS: 4.0000 mg | ORAL_TABLET | Freq: Four times a day (QID) | ORAL | Status: DC | PRN

## 2024-01-17 MED ORDER — LACTATED RINGERS IV BOLUS
20.0000 mL/kg | Freq: Once | INTRAVENOUS | Status: AC
  Administered 2024-01-17: 1496 mL via INTRAVENOUS

## 2024-01-17 MED ORDER — INSULIN REGULAR(HUMAN) IN NACL 100-0.9 UT/100ML-% IV SOLN
INTRAVENOUS | Status: DC
  Administered 2024-01-17: 10 [IU]/h via INTRAVENOUS
  Filled 2024-01-17: qty 100

## 2024-01-17 MED ORDER — SODIUM CHLORIDE 0.9 % IV SOLN
2.0000 g | INTRAVENOUS | Status: DC
  Administered 2024-01-17 – 2024-01-18 (×2): 2 g via INTRAVENOUS
  Filled 2024-01-17 (×2): qty 20

## 2024-01-17 MED ORDER — INSULIN ASPART 100 UNIT/ML IJ SOLN
8.0000 [IU] | Freq: Once | INTRAMUSCULAR | Status: AC
  Administered 2024-01-17: 8 [IU] via INTRAVENOUS

## 2024-01-17 MED ORDER — ACETAMINOPHEN 325 MG PO TABS: 650.0000 mg | ORAL_TABLET | Freq: Four times a day (QID) | ORAL | Status: DC | PRN

## 2024-01-17 MED ORDER — ONDANSETRON HCL 4 MG/2ML IJ SOLN: 4.0000 mg | Freq: Four times a day (QID) | INTRAMUSCULAR | Status: DC | PRN

## 2024-01-17 MED ORDER — LACTATED RINGERS IV SOLN: INTRAVENOUS | Status: DC

## 2024-01-17 MED ORDER — ACETAMINOPHEN 650 MG RE SUPP: 650.0000 mg | Freq: Four times a day (QID) | RECTAL | Status: DC | PRN

## 2024-01-17 MED ORDER — CALCIUM GLUCONATE-NACL 1-0.675 GM/50ML-% IV SOLN
1.0000 g | Freq: Once | INTRAVENOUS | Status: AC
  Administered 2024-01-17: 1000 mg via INTRAVENOUS
  Filled 2024-01-17: qty 50

## 2024-01-17 MED ORDER — ENOXAPARIN SODIUM 30 MG/0.3ML IJ SOSY
30.0000 mg | PREFILLED_SYRINGE | INTRAMUSCULAR | Status: DC
  Administered 2024-01-17: 30 mg via SUBCUTANEOUS
  Filled 2024-01-17: qty 0.3

## 2024-01-17 NOTE — Assessment & Plan Note (Addendum)
Cholecystitis s/p cholecystostomy in 07/2022 Drain last exchanged 12/19/23 Daughter concerned it may have migrated as not has much output over last 2 days with nausea.  Has drainage in tube at time of admit, denies abdominal pain  IR evaluated him and there is no need to replace at this time, they will arrange next week for schedule exchange. -Continue with drain care

## 2024-01-17 NOTE — Assessment & Plan Note (Signed)
Continue lipitor  ?

## 2024-01-17 NOTE — Assessment & Plan Note (Addendum)
Ulnar gutter splint  Ortho consulted: Dr. Yehuda Budd and recommended follow up in one week  Not a candidate for surgery

## 2024-01-17 NOTE — Assessment & Plan Note (Signed)
Sepsis criteria met in setting of UTI  IVF, cultured, continue rocephin

## 2024-01-17 NOTE — Progress Notes (Signed)
Orthopedic Tech Progress Note Patient Details:  ESIQUIO PIDCOCK [birth year] [id number]  Ortho Devices Type of Ortho Device: Ulna gutter splint Ortho Device/Splint Location: LUE Ortho Device/Splint Interventions: Ordered, Application   Post Interventions Patient Tolerated: Well  Andre Stout 01/17/2024, 3:48 PM

## 2024-01-17 NOTE — Assessment & Plan Note (Signed)
Pseudohyponatremia. Corrects to normal with elevated glucose

## 2024-01-17 NOTE — Assessment & Plan Note (Signed)
Patient is asymptomatic.  Follows with cardiology Dr. Ladona Ridgel, on watchful waiting.

## 2024-01-17 NOTE — ED Triage Notes (Signed)
Pt BIB EMS for AMS for 3 days and hyperglycemia. Axox2.

## 2024-01-17 NOTE — Assessment & Plan Note (Signed)
Likely secondary to DKA/hyperglycemia and UTI  Cultures pending, continue rocephin CT head negative for acute finding Delirium precautions

## 2024-01-17 NOTE — Assessment & Plan Note (Addendum)
CBG improved and gap closed so he was transitioned to basal and short-acting.  A1c of 10.7 in November 2024. -Continue with [facility] and SSI

## 2024-01-17 NOTE — Assessment & Plan Note (Signed)
Creatinine baseline around 1.1, presenting with 2.08 UA with >0-50 RBC  Strict I/O Hold nephrotoxic drugs Continue IVF hydration  Trend

## 2024-01-17 NOTE — ED Notes (Signed)
Per flow manager will release after 5pm, when Dr. Artis Flock comes in I informed Dr Rolland Bimler of this as well.

## 2024-01-17 NOTE — H&P (Signed)
History and Physical    Patient: Andre Stout GLO:[id number] DOB: November 05, 1940 DOA: 01/17/2024 DOS: the patient was seen and examined on 01/17/2024 PCP: Olive Bass, FNP  Patient coming from: Home - lives with his daughter and granddaughter. He uses a walker with assistance.    Chief Complaint: increased confusion from baseline and elevated blood sugars   HPI: Andre Stout is a 84 y.o. 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 He has been weak, vomiting and has not eaten in 4-5 days. He has been drinking plenty of fluids. He had an appointment today to make sure his drain wasn't dislodged, but he was so weak she brought him to ED. She doesn't really think he is confused. She told me that he fell on Sunday. She thinks he stepped wrong on his pants and his hand on the dresser. He is right handed. His daughter also states his sugars have been high for about 3 days. She states they have been around 300 or high on his readings.   He has had no fevers/chills. He has a chronic cough no change. He denies any abdominal pain, but no diarrhea. +vomiting. He has had about 4 episodes of emesis in the last 2 days. He has not complained about any urinary symptoms.   She states his drain has been draining fine until 2 days ago. She typically drains it 2x/day, but hasn't emptied for past 2 days. Last exchanged drain on 12/19/23.    He does not smoke or drink alcohol.   ER Course:  vitals: afebrile, bp: 113/45, HR: 97, RR: 18, oxygen: 97%RA Pertinent lab: WBC: 19.3, sodium: 128, BUN: 52, creatinine: 2.08, AG: 19,  Beta hydroxybutyric acid: 3.24,  CXR: no active disease Ct head: no acute finding. Old small vessel infarctions of the basal ganglia and thalami. Extensive mastoid effusion the left Ct cervical spine: no acute finding.  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  Left hand xray: fracture at the distal diaphyseal metaphyseal junction of the fifth metacarpal with dorsal angulation  In ED: started on insulin drip, given IVF and DKA protocol.    Review of Systems: As mentioned in the history of present illness. All other systems reviewed and are negative. Past Medical History:  Diagnosis Date   Diabetes mellitus without complication (HCC)    Glaucoma    Heart block    complete heart block   Hypertension    Prostate cancer (HCC)    Been 3-4 years ago   Past Surgical History:  Procedure Laterality Date   CATARACT EXTRACTION Bilateral    INSERTION PROSTATE RADIATION SEED     IR EXCHANGE BILIARY DRAIN  08/19/2023   IR EXCHANGE BILIARY DRAIN  12/19/2023   IR PERC CHOLECYSTOSTOMY  08/17/2022   IR RADIOLOGIST EVAL & MGMT  09/28/2022   IR RADIOLOGIST EVAL & MGMT  10/10/2022   Social History:  reports that he quit smoking about 49 years ago. His smoking use included cigarettes. He started smoking about 53 years ago. He has a 2 pack-year smoking history. He has never used smokeless tobacco. He reports that he does not drink alcohol and does not use drugs.  No Known Allergies  Family History  Problem Relation Age of Onset   Other Mother  unknown medical history   Diabetes Father    Diabetes Paternal Grandfather     Prior to Admission medications   Medication Sig Start Date End Date Taking? Authorizing Provider  acetaminophen (TYLENOL) 500 MG tablet Take 1,000 mg by mouth daily as needed (pain).    [provider]  amLODipine (NORVASC) 10 MG tablet Take 1 tablet (10 mg total) by mouth daily. 11/15/23   Olive Bass, FNP  atorvastatin (LIPITOR) 40 MG tablet Take 1 tablet (40 mg total) by mouth daily. 11/15/23   Olive Bass, FNP  Azelastine HCl 137 MCG/SPRAY SOLN PLACE 1 SPRAY INTO BOTH NOSTRILS 2 (TWO) TIMES DAILY.  USE IN [facility] NOSTRIL AS DIRECTED 12/31/23   Drubel, Lillia Abed, PA-C  benzonatate (TESSALON) 100 MG capsule Take 1 capsule (100 mg total) by mouth 3 (three) times daily as needed for cough. 12/17/23   Olive Bass, FNP  Blood Glucose Monitoring Suppl (ACCU-CHEK GUIDE) w/Device KIT Use As Directed 08/24/22   Lanae Boast, MD  brimonidine (ALPHAGAN) 0.2 % ophthalmic solution Place 1 drop into both eyes 2 (two) times daily with breakfast and lunch. 12/18/21   [provider]  clopidogrel (PLAVIX) 75 MG tablet Take 1 tablet (75 mg total) by mouth daily. 11/15/23   Olive Bass, FNP  Continuous Blood Gluc Receiver (FREESTYLE LIBRE 3 READER) DEVI 1 each by Does not apply route daily. 01/22/23   Carlus Pavlov, MD  Continuous Blood Gluc Sensor (FREESTYLE LIBRE 3 SENSOR) MISC 1 each by Does not apply route every 14 (fourteen) days. 01/22/23   Carlus Pavlov, MD  dorzolamide-timolol (COSOPT) 22.3-6.8 MG/ML ophthalmic solution Place 1 drop into both eyes 2 (two) times daily with breakfast and lunch. 05/18/22   [provider]  insulin aspart protamine - aspart (NOVOLOG 70/30 FLEXPEN) (70-30) 100 UNIT/ML FlexPen 30 units before b'fast 20 units before lunch 15 units before dinner Take the insulin 10-15 min before meals. 11/15/23   Carlus Pavlov, MD  Insulin Pen Needle 32G X 4 MM MISC Use to inject insulin 3 times a day 11/15/23   Carlus Pavlov, MD  Insulin Syringe-Needle U-100 (BD INSULIN SYRINGE U/F) 31G X 5/16" 1 ML MISC USE 2 DAILY 11/21/22   Carlus Pavlov, MD  latanoprost (XALATAN) 0.005 % ophthalmic solution Place 1 drop into both eyes at bedtime. 01/01/22   [provider]  lisinopril (ZESTRIL) 20 MG tablet Take 1 tablet (20 mg total) by mouth daily. 01/09/24   Olive Bass, FNP  magnesium oxide (MAG-OX) 400 (240 Mg) MG tablet Take 1 tablet (400 mg total) by mouth every evening. 12/17/23 12/11/24  Olive Bass, FNP  Multiple  Vitamins-Minerals (PRESERVISION AREDS 2) CAPS Take 1 capsule by mouth at bedtime.    [provider]  [facility] DELICA LANCETS 33G MISC Use to check blood sugar 4 times per day. Dx code: E11.9 03/27/16   Romero Belling, MD  [facility] ULTRA test strip USE TO MONITOR GLUCOSE LEVELS 4 TIMES PER DAY E11.9 06/10/19   Romero Belling, MD  pantoprazole (PROTONIX) 40 MG tablet Take 1 tablet (40 mg total) by mouth 2 (two) times daily before a meal. 11/15/23   Olive Bass, FNP  predniSONE (DELTASONE) 20 MG tablet Take 1 tablet (20 mg total) by mouth daily with breakfast. 12/17/23   Olive Bass, FNP  sodium chloride flush 0.9 % SOLN injection Inject 5 mLs into the vein as needed for up to 30 doses. Discard remainder of syringe. Patient taking differently: Inject  5 mLs into the vein daily. 08/24/22   Lanae Boast, MD    Physical Exam: Vitals:   01/17/24 1349 01/17/24 1400 01/17/24 1415 01/17/24 1922  BP: (!) 113/45 (!) 108/44 (!) 115/48   Pulse: 80 76    Resp: 18 16 20    Temp: 97.8 F (36.6 C)   98.1 F (36.7 C)  TempSrc: Oral   Oral  SpO2: 97% 98%    Weight:      Height:       General:  Appears calm and comfortable and is in NAD Eyes:  PERRL, EOMI, normal lids, iris ENT:  HOH, lips & tongue, dry mucous membranes; appropriate dentition Neck:  no LAD, masses or thyromegaly; no carotid bruits Cardiovascular:  RRR, no m/r/g. trace LE edema.  Respiratory:   CTA bilaterally with no wheezes/rales/rhonchi.  Normal respiratory effort. Abdomen:  soft, NT, ND, NABS. Drain with fluid.  Back:   normal alignment, no CVAT Skin:  no rash or induration seen on limited exam Musculoskeletal:  grossly normal tone BUE/BLE, good ROM, no bony abnormality Lower extremity:   Limited foot exam with no ulcerations.  2+ distal pulses. Psychiatric:  grossly normal mood and affect, speech fluent and appropriate, AO to self/place. Not date (baseline)  Neurologic:  CN 2-12 grossly intact, moves all  extremities in coordinated fashion, sensation intact   Radiological Exams on Admission: Independently reviewed - see discussion in A/P where applicable  CT Cervical Spine Wo Contrast Result Date: 01/17/2024 CLINICAL DATA:  Fall.  Trauma to the head and neck. EXAM: CT CERVICAL SPINE WITHOUT CONTRAST TECHNIQUE: Multidetector CT imaging of the cervical spine was performed without intravenous contrast. Multiplanar CT image reconstructions were also generated. RADIATION DOSE REDUCTION: This exam was performed according to the departmental dose-optimization program which includes automated exposure control, adjustment of the mA and/or kV according to patient size and/or use of iterative reconstruction technique. COMPARISON:  None Available. FINDINGS: Alignment: No traumatic malalignment. Somewhat exaggerated cervical lordosis but without listhesis. Skull base and vertebrae: No regional fracture or focal bone lesion. 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 No significant regional facet arthropathy. Upper chest: Negative Other: None IMPRESSION: No acute or traumatic finding. 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 Electronically Signed   By: Paulina Fusi M.D.   On: 01/17/2024 14:53   DG Chest Portable 1 View Result Date: 01/17/2024 CLINICAL DATA:  Fall EXAM: PORTABLE CHEST 1 VIEW COMPARISON:  07/31/2022 FINDINGS: Heart size upper limits of normal. Ordinary thoracic aortic atherosclerosis. The lungs are clear. No infiltrate, collapse or effusion. No pneumothorax. No acute regional bone finding. IMPRESSION: No active disease. Aortic atherosclerosis. Electronically Signed   By: Paulina Fusi M.D.   On: 01/17/2024 14:52   DG Hand Complete Left Result Date: 01/17/2024 CLINICAL DATA:  Fall with trauma to the and. EXAM: LEFT HAND - COMPLETE  3+ VIEW COMPARISON:  None FINDINGS: Fracture at the distal diaphyseal metaphyseal junction of the fifth metacarpal with dorsal angulation. Boxer's fracture configuration. No other regional acute injury. 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 IMPRESSION: Fracture at the distal diaphyseal metaphyseal junction of the fifth metacarpal with dorsal angulation. Electronically Signed   By: Paulina Fusi M.D.   On: 01/17/2024 14:51   CT Head Wo Contrast Result Date: 01/17/2024 CLINICAL DATA:  Fall with trauma to the head and neck EXAM: CT HEAD  WITHOUT CONTRAST TECHNIQUE: Contiguous axial images were obtained from the base of the skull through the vertex without intravenous contrast. RADIATION DOSE REDUCTION: This exam was performed according to the departmental dose-optimization program which includes automated exposure control, adjustment of the mA and/or kV according to patient size and/or use of iterative reconstruction technique. COMPARISON:  10/16/2022 FINDINGS: Brain: No acute or traumatic finding. Generalized volume loss. Chronic small-vessel ischemic changes of the white matter. Old small vessel infarctions of the basal ganglia and thalami. No mass, hemorrhage, hydrocephalus or extra-axial collection. Vascular: There is atherosclerotic calcification of the major vessels at the base of the brain. Skull: Negative Sinuses/Orbits: Paranasal sinuses show mild seasonal mucosal thickening but no advanced finding. Extensive mastoid effusion on the left. Orbits negative. Other: None IMPRESSION: 1. No acute or traumatic finding. Generalized volume loss. Chronic small-vessel ischemic changes of the white matter. Old small vessel infarctions of the basal ganglia and thalami. 2. Extensive mastoid effusion on the left. Electronically Signed   By: Paulina Fusi M.D.   On: 01/17/2024 14:50    EKG: Independently reviewed.  NSR with rate 74; nonspecific ST changes with no evidence of acute ischemia   Labs on Admission: I have  personally reviewed the available labs and imaging studies at the time of the admission.  Pertinent labs:   WBC: 19.3,  sodium: 128, glucose: 424 corrects to 136   BUN: 52,  creatinine: 2.08,  AG: 19,  Beta hydroxybutyric acid: 3.24  Assessment and Plan: Principal Problem:   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 (HCC) Active Problems:   Heart block AV complete (HCC)   Acute encephalopathy   AKI (acute kidney injury) (HCC)   fracture of distal diaphyseal metaphyseal junction of the 5th left metacarpal with dorsal angulation   Leukocytosis   Cholecystostomy care (HCC)   Hyponatremia   Benign essential HTN   History of CVA (cerebrovascular accident)   Hyperlipidemia    Assessment and Plan: * 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 (HCC) 84 year old male presenting with increased confusion from baseline found to have hyperglycemia and mild DKA, AKI and UTI.  -admit to progressive -Patient with poor baseline control with A1C of 10.7 in November 2024 and in setting of UTI which could also be driving this.  -his pH is normal and also has had poor PO intake which also could be driving his ketoacidosis.  -K+ elevated at time of presentation and so potassium supplementation held  -Still waiting on additional IVF, they have been ordered  -IVF at 125 cc/hr, LR until glucose <250 and then 125 and change to D5LR -insulin gtt and DKA protocol with bmp q 4 hours and beta hydroxybutyrate q 8 hours.  -NPO except for meds  -would continue drip overnight   Acute encephalopathy Likely secondary to DKA/hyperglycemia and UTI  Continue DKA protocol Cultures pending, continue rocephin Check metabolic labs CT head negative for acute finding Delirium precautions   Heart block AV complete (HCC) Patient is asymptomatic. Follows with cardiology Dr. Ladona Ridgel, on watchful waiting.   AKI (acute kidney injury) (HCC) Creatinine baseline around 1.1,  presenting with 2.08 UA with >0-50 RBC  Strict I/O Hold nephrotoxic drugs Continue IVF hydration  Trend   fracture of distal diaphyseal metaphyseal junction of the 5th left metacarpal with dorsal angulation Ulnar gutter splint  Ortho consulted: Dr. Yehuda Budd and recommended follow up in one week  Not a candidate for surgery   Cholecystostomy care [facility]) Cholecystitis s/p cholecystostomy in  07/2022 Drain last exchanged 12/19/23 Daughter concerned it may have migrated as not has much output over last 2 days with nausea.  Has drainage in tube at time of admit, denies abdominal pain  Drain care IR consulted to check for tube placement    Leukocytosis Sepsis criteria met in setting of UTI  IVF, cultured, continue rocephin   Hyponatremia Pseudohyponatremia. Corrects to normal with elevated glucose   Benign essential HTN Hold amlodipine with soft pressures  hold lisinopril with aKI   History of CVA (cerebrovascular accident) Continue medical management   Hyperlipidemia Continue lipitor     Advance Care Planning:   Code Status: Full Code   Consults: nutrition, diabetes coordinator, PT ortho: Dr. Yehuda Budd and IR    DVT Prophylaxis: lovenox   Family Communication: updated daughter by phone   Severity of Illness: The appropriate patient status for this patient is INPATIENT. Inpatient status is judged to be reasonable and necessary in order to provide the required intensity of service to ensure the patient's safety. The patient's presenting symptoms, physical exam findings, and initial radiographic and laboratory data in the context of their chronic comorbidities is felt to place them at high risk for further clinical deterioration. Furthermore, it is not anticipated that the patient will be medically stable for discharge from the hospital within 2 midnights of admission.   * I certify that at the point of admission it is my clinical judgment that the patient will require inpatient  hospital care spanning beyond 2 midnights from the point of admission due to high intensity of service, high risk for further deterioration and high frequency of surveillance required.*  Author: Orland Mustard, MD 01/17/2024 7:29 PM  For on call review [email or web address].

## 2024-01-17 NOTE — Assessment & Plan Note (Signed)
Continue medical management 

## 2024-01-17 NOTE — ED Provider Notes (Signed)
Medical Decision Making: Care of patient assumed from Luther Hearing, PA-C at 3:18 PM.  Agree with history.  See their note for further details.  Briefly, 84 y.o. male with PMH/PSH as below.  Past Medical History:  Diagnosis Date   Diabetes mellitus without complication (HCC)    Glaucoma    Heart block    complete heart block   Hypertension    Prostate cancer (HCC)    Been 3-4 years ago   Past Surgical History:  Procedure Laterality Date   CATARACT EXTRACTION Bilateral    INSERTION PROSTATE RADIATION SEED     IR EXCHANGE BILIARY DRAIN  08/19/2023   IR EXCHANGE BILIARY DRAIN  12/19/2023   IR PERC CHOLECYSTOSTOMY  08/17/2022   IR RADIOLOGIST EVAL & MGMT  09/28/2022   IR RADIOLOGIST EVAL & MGMT  10/10/2022      Patient presentation and current plan as follows:  Clinical Course as of 01/17/24 1518  Fri Jan 17, 2024  1457 Hx dementia?  AMS--mildly increased confusion from baseline, labile BGs C/f DKA, AKI  Recent fall--5th MCP fx > ulnar gutter splint Supposed to have GB removed, has cholecystostomy tube removed  [ ]  UA  [GG]    Clinical Course User Index [GG] Mikeal Hawthorne, MD    MDM:  -Reviewed and confirmed nursing documentation for past medical history, family history, social history. -Vital signs stable.*** -Upon reevaluation, patient ***.   -Patient had no acute events while under my care in the emergency department. ***    The plan for this patient was discussed with Dr. ***, who voiced agreement and who oversaw evaluation and treatment of this patient.  Mikeal Hawthorne, MD Emergency Medicine, PGY-2  Note: Dragon medical dictation software was used in the creation of this note.   @DISPOSITION @

## 2024-01-17 NOTE — ED Provider Notes (Signed)
EMERGENCY DEPARTMENT AT [facility] Provider Note   CSN: [id number] Arrival date & time: 01/17/24  1313     History  Chief Complaint  Patient presents with   Altered Mental Status   Hyperglycemia    Andre Stout is a 84 y.o. male with past medical history significant for hypertension, hypophosphatemia, hypomagnesemia, prostate cancer, insulin-dependent type 2 diabetes, AV block, previous CVA who presents with concern for altered mental status for 3 days and hyperglycemia.  EMS reports that he has been out of his Plavix for several days to weeks.  Family reports that his blood sugar has been labile, and they are having a hard time controlling it.  He reports some stomach pain, and also had recent fall with left hand injury.  He denies hitting his head, denies loss of consciousness.  He is alert and oriented to self, and place but not time.   Altered Mental Status Hyperglycemia Associated symptoms: altered mental status        Home Medications Prior to Admission medications   Medication Sig Start Date End Date Taking? Authorizing Provider  acetaminophen (TYLENOL) 500 MG tablet Take 1,000 mg by mouth daily as needed (pain).    [provider]  amLODipine (NORVASC) 10 MG tablet Take 1 tablet (10 mg total) by mouth daily. 11/15/23   Olive Bass, FNP  atorvastatin (LIPITOR) 40 MG tablet Take 1 tablet (40 mg total) by mouth daily. 11/15/23   Olive Bass, FNP  Azelastine HCl 137 MCG/SPRAY SOLN PLACE 1 SPRAY INTO BOTH NOSTRILS 2 (TWO) TIMES DAILY. USE IN [facility] NOSTRIL AS DIRECTED 12/31/23   Drubel, Lillia Abed, PA-C  benzonatate (TESSALON) 100 MG capsule Take 1 capsule (100 mg total) by mouth 3 (three) times daily as needed for cough. 12/17/23   Olive Bass, FNP  Blood Glucose Monitoring Suppl (ACCU-CHEK GUIDE) w/Device KIT Use As Directed 08/24/22   Lanae Boast, MD  brimonidine (ALPHAGAN) 0.2 % ophthalmic solution Place 1 drop into  both eyes 2 (two) times daily with breakfast and lunch. 12/18/21   [provider]  clopidogrel (PLAVIX) 75 MG tablet Take 1 tablet (75 mg total) by mouth daily. 11/15/23   Olive Bass, FNP  Continuous Blood Gluc Receiver (FREESTYLE LIBRE 3 READER) DEVI 1 each by Does not apply route daily. 01/22/23   Carlus Pavlov, MD  Continuous Blood Gluc Sensor (FREESTYLE LIBRE 3 SENSOR) MISC 1 each by Does not apply route every 14 (fourteen) days. 01/22/23   Carlus Pavlov, MD  dorzolamide-timolol (COSOPT) 22.3-6.8 MG/ML ophthalmic solution Place 1 drop into both eyes 2 (two) times daily with breakfast and lunch. 05/18/22   [provider]  insulin aspart protamine - aspart (NOVOLOG 70/30 FLEXPEN) (70-30) 100 UNIT/ML FlexPen 30 units before b'fast 20 units before lunch 15 units before dinner Take the insulin 10-15 min before meals. 11/15/23   Carlus Pavlov, MD  Insulin Pen Needle 32G X 4 MM MISC Use to inject insulin 3 times a day 11/15/23   Carlus Pavlov, MD  Insulin Syringe-Needle U-100 (BD INSULIN SYRINGE U/F) 31G X 5/16" 1 ML MISC USE 2 DAILY 11/21/22   Carlus Pavlov, MD  latanoprost (XALATAN) 0.005 % ophthalmic solution Place 1 drop into both eyes at bedtime. 01/01/22   [provider]  lisinopril (ZESTRIL) 20 MG tablet Take 1 tablet (20 mg total) by mouth daily. 01/09/24   Olive Bass, FNP  magnesium oxide (MAG-OX) 400 (240 Mg) MG tablet Take 1 tablet (400  mg total) by mouth every evening. 12/17/23 12/11/24  Olive Bass, FNP  Multiple Vitamins-Minerals (PRESERVISION AREDS 2) CAPS Take 1 capsule by mouth at bedtime.    [provider]  [facility] DELICA LANCETS 33G MISC Use to check blood sugar 4 times per day. Dx code: E11.9 03/27/16   Romero Belling, MD  [facility] ULTRA test strip USE TO MONITOR GLUCOSE LEVELS 4 TIMES PER DAY E11.9 06/10/19   Romero Belling, MD  pantoprazole (PROTONIX) 40 MG tablet Take 1 tablet (40 mg total) by mouth  2 (two) times daily before a meal. 11/15/23   Olive Bass, FNP  predniSONE (DELTASONE) 20 MG tablet Take 1 tablet (20 mg total) by mouth daily with breakfast. 12/17/23   Olive Bass, FNP  sodium chloride flush 0.9 % SOLN injection Inject 5 mLs into the vein as needed for up to 30 doses. Discard remainder of syringe. Patient taking differently: Inject 5 mLs into the vein daily. 08/24/22   Lanae Boast, MD      Allergies    Patient has no known allergies.    Review of Systems   Review of Systems  All other systems reviewed and are negative.   Physical Exam Updated Vital Signs BP (!) 115/48   Pulse 76   Temp 97.8 F (36.6 C) (Oral)   Resp 20   Ht 5\' 5"  (1.651 m)   Wt 74.8 kg   SpO2 98%   BMI 27.46 kg/m  Physical Exam Vitals and nursing note reviewed.  Constitutional:      General: He is not in acute distress.    Appearance: Normal appearance.  HENT:     Head: Normocephalic and atraumatic.  Eyes:     General:        Right eye: No discharge.        Left eye: No discharge.  Cardiovascular:     Rate and Rhythm: Normal rate and regular rhythm.     Heart sounds: No murmur heard.    No friction rub. No gallop.  Pulmonary:     Effort: Pulmonary effort is normal.     Breath sounds: Normal breath sounds.  Abdominal:     General: Bowel sounds are normal.     Palpations: Abdomen is soft.  Skin:    General: Skin is warm and dry.     Capillary Refill: Capillary refill takes less than 2 seconds.  Neurological:     Mental Status: He is alert.     Comments: Alert and oriented to self and place but not time, follows commands.  Moving all 4 limbs spontaneously, intact strength 5/5, he has questionable left-sided gaze preference, and hesitates to cross midline.  Pupils are equal round reactive to light.  Psychiatric:        Mood and Affect: Mood normal.        Behavior: Behavior normal.     ED Results / Procedures / Treatments   Labs (all labs ordered are  listed, but only abnormal results are displayed) Labs Reviewed  CBC - Abnormal; Notable for the following components:      Result Value   WBC 19.3 (*)    RDW 16.1 (*)    Platelets 571 (*)    All other components within normal limits  COMPREHENSIVE METABOLIC PANEL - Abnormal; Notable for the following components:   Sodium 128 (*)    Chloride 89 (*)    CO2 20 (*)    Glucose, Bld 424 (*)  BUN 52 (*)    Creatinine, Ser 2.08 (*)    Albumin 2.8 (*)    Total Bilirubin 2.1 (*)    GFR, Estimated 31 (*)    Anion gap 19 (*)    All other components within normal limits  BETA-HYDROXYBUTYRIC ACID - Abnormal; Notable for the following components:   Beta-Hydroxybutyric Acid 3.24 (*)    All other components within normal limits  I-STAT VENOUS BLOOD GAS, ED - Abnormal; Notable for the following components:   pCO2, Ven 43.3 (*)    pO2, Ven 29 (*)    Sodium 129 (*)    Potassium 5.8 (*)    All other components within normal limits  I-STAT CHEM 8, ED - Abnormal; Notable for the following components:   Sodium 128 (*)    Potassium 5.8 (*)    Chloride 95 (*)    BUN 72 (*)    Creatinine, Ser 1.90 (*)    Glucose, Bld 426 (*)    All other components within normal limits  CBG MONITORING, ED - Abnormal; Notable for the following components:   Glucose-Capillary 408 (*)    All other components within normal limits  CBG MONITORING, ED - Abnormal; Notable for the following components:   Glucose-Capillary 287 (*)    All other components within normal limits  MAGNESIUM  PHOSPHORUS  LIPASE, BLOOD  URINALYSIS, ROUTINE W REFLEX MICROSCOPIC    EKG None  Radiology CT Cervical Spine Wo Contrast Result Date: 01/17/2024 CLINICAL DATA:  Fall.  Trauma to the head and neck. EXAM: CT CERVICAL SPINE WITHOUT CONTRAST TECHNIQUE: Multidetector CT imaging of the cervical spine was performed without intravenous contrast. Multiplanar CT image reconstructions were also generated. RADIATION DOSE REDUCTION: This exam  was performed according to the departmental dose-optimization program which includes automated exposure control, adjustment of the mA and/or kV according to patient size and/or use of iterative reconstruction technique. COMPARISON:  None Available. FINDINGS: Alignment: No traumatic malalignment. Somewhat exaggerated cervical lordosis but without listhesis. Skull base and vertebrae: No regional fracture or focal bone lesion. 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 No significant regional facet arthropathy. Upper chest: Negative Other: None IMPRESSION: No acute or traumatic finding. 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 Electronically Signed   By: Paulina Fusi M.D.   On: 01/17/2024 14:53   DG Chest Portable 1 View Result Date: 01/17/2024 CLINICAL DATA:  Fall EXAM: PORTABLE CHEST 1 VIEW COMPARISON:  07/31/2022 FINDINGS: Heart size upper limits of normal. Ordinary thoracic aortic atherosclerosis. The lungs are clear. No infiltrate, collapse or effusion. No pneumothorax. No acute regional bone finding. IMPRESSION: No active disease. Aortic atherosclerosis. Electronically Signed   By: Paulina Fusi M.D.   On: 01/17/2024 14:52   DG Hand Complete Left Result Date: 01/17/2024 CLINICAL DATA:  Fall with trauma to the and. EXAM: LEFT HAND - COMPLETE 3+ VIEW COMPARISON:  None FINDINGS: Fracture at the distal diaphyseal metaphyseal junction of the fifth metacarpal with dorsal angulation. Boxer's fracture configuration. No other regional acute injury. 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 IMPRESSION: Fracture at the distal diaphyseal metaphyseal junction of the fifth metacarpal with dorsal angulation. Electronically Signed   By: Paulina Fusi M.D.   On: 01/17/2024 14:51   CT Head Wo Contrast Result Date: 01/17/2024 CLINICAL DATA:  Fall with  trauma to the head and neck EXAM: CT HEAD  WITHOUT CONTRAST TECHNIQUE: Contiguous axial images were obtained from the base of the skull through the vertex without intravenous contrast. RADIATION DOSE REDUCTION: This exam was performed according to the departmental dose-optimization program which includes automated exposure control, adjustment of the mA and/or kV according to patient size and/or use of iterative reconstruction technique. COMPARISON:  10/16/2022 FINDINGS: Brain: No acute or traumatic finding. Generalized volume loss. Chronic small-vessel ischemic changes of the white matter. Old small vessel infarctions of the basal ganglia and thalami. No mass, hemorrhage, hydrocephalus or extra-axial collection. Vascular: There is atherosclerotic calcification of the major vessels at the base of the brain. Skull: Negative Sinuses/Orbits: Paranasal sinuses show mild seasonal mucosal thickening but no advanced finding. Extensive mastoid effusion on the left. Orbits negative. Other: None IMPRESSION: 1. No acute or traumatic finding. Generalized volume loss. Chronic small-vessel ischemic changes of the white matter. Old small vessel infarctions of the basal ganglia and thalami. 2. Extensive mastoid effusion on the left. Electronically Signed   By: Paulina Fusi M.D.   On: 01/17/2024 14:50    Procedures .Critical Care  Performed by: Olene Floss, PA-C Authorized by: Olene Floss, PA-C   Critical care provider statement:    Critical care time (minutes):  35   Critical care was necessary to treat or prevent imminent or life-threatening deterioration of the following conditions:  Metabolic crisis and endocrine crisis (DKA requiring IV drip)   Critical care was time spent personally by me on the following activities:  Development of treatment plan with patient or surrogate, discussions with consultants, evaluation of patient's response to treatment, examination of patient, ordering and review of  laboratory studies, ordering and review of radiographic studies, ordering and performing treatments and interventions, pulse oximetry, re-evaluation of patient's condition and review of old charts     Medications Ordered in ED Medications  calcium gluconate 1 g/ 50 mL sodium chloride IVPB (1,000 mg Intravenous New Bag/Given 01/17/24 1419)  lactated ringers bolus 1,496 mL (has no administration in time range)  insulin regular, human (MYXREDLIN) 100 units/ 100 mL infusion (has no administration in time range)  lactated ringers infusion (has no administration in time range)  dextrose 5 % in lactated ringers infusion (has no administration in time range)  dextrose 50 % solution 0-50 mL (has no administration in time range)  sodium chloride 0.9 % bolus 1,000 mL (1,000 mLs Intravenous New Bag/Given 01/17/24 1407)  insulin aspart (novoLOG) injection 8 Units (8 Units Intravenous Given 01/17/24 1415)    ED Course/ Medical Decision Making/ A&P Clinical Course as of 01/17/24 1511  Fri Jan 17, 2024  1457 Hx dementia?  AMS--mildly increased confusion from baseline, labile BGs C/f DKA, AKI  Recent fall--5th MCP fx > ulnar gutter splint Supposed to have GB removed, has cholecystostomy tube removed  [ ]  UA  [GG]    Clinical Course User Index [GG] Mikeal Hawthorne, MD                                 Medical Decision Making Amount and/or Complexity of Data Reviewed Labs: ordered. Radiology: ordered.   This patient is a 84 y.o. male  who presents to the ED for concern of altered mental status, hyperglycemia.   Differential diagnoses prior to evaluation: The emergent differential diagnosis includes, but is not limited to,  CVA, seizure, hypotension, sepsis, hypoglycemia, hypoxic encephalopathy, metabolic encephalopathy, polypharmacy, substance abuse, developing dementia or alzheimers, meningitis, encephalitis, hypertensive  emergency, other systemic infection, acute alcohol intoxication, acute  alcohol or other drug withdrawal or psychiatric manifestation vs other . This is not an exhaustive differential.   Past Medical History / Co-morbidities / Social History: hypertension, hypophosphatemia, hypomagnesemia, prostate cancer, insulin-dependent type 2 diabetes, AV block, previous CVA  Physical Exam: Physical exam performed. The pertinent findings include: Patient somewhat altered, he is alert and oriented to self but not time, he is alert and oriented to place.  He has some bruising, soft tissue deformity of the left hand, suspicious for possible metacarpal fracture.  No other signs of trauma noted on exam.  Lab Tests/Imaging studies: I personally interpreted labs/imaging and the pertinent results include: CBC notable for leukocytosis, white blood cells 19.3, platelets 571, may be some degree of hemoconcentration but suspicious for possible infectious etiology as well.  His Chem-8 is notable for pseudohyponatremia, sodium 120 in context of glucose 426.  He is hyperkalemic, testing 5.8, creatinine, BUN are elevated, 32, 1.9 from baseline of around 1, representative of AKI.  CBG without acidosis.  anion gap noted, could suggest developing DKA.  I do feel interpreted plain film radiograph of the left hand, and chest x-ray which shows no evidence of acute intrathoracic abnormality, similar compared to his baseline, left hand with fifth metacarpal fracture.  I independently interpreted CT head, CT C-spine who official radiology interpretation pending at this time.  Cardiac monitoring: EKG obtained and interpreted by myself and attending physician which shows: Normal sinus rhythm, no significant change from baseline.   Medications: I ordered medication including fluids, calcium gluconate, insulin for hyperkalemia, dehydration, AKI, hyperglycemia.  I have reviewed the patients home medicines and have made adjustments as needed.  3:11 PM Care of Andre Stout transferred to Resident Dr. Rolland Bimler  and Dr. Silverio Lay at the end of my shift as the patient will require reassessment once labs/imaging have resulted. Patient presentation, ED course, and plan of care discussed with review of all pertinent labs and imaging. Please see his/her note for further details regarding further ED course and disposition. Plan at time of handoff is probably admission for DKA, ongoing workup for leukocytosis and reassessment for response to treatment, suspect probably admission. This may be altered or completely changed at the discretion of the oncoming team pending results of further workup.  Final Clinical Impression(s) / ED Diagnoses Final diagnoses:  None    Rx / DC Orders ED Discharge Orders     None         [place] 01/17/24 1511    Gerhard Munch, MD 01/17/24 1540

## 2024-01-17 NOTE — ED Notes (Signed)
Patient received 1 liter LR bolus the fluid went into the tissue and Dr Silverio Lay was made aware.

## 2024-01-17 NOTE — Assessment & Plan Note (Addendum)
Hold amlodipine with soft pressures  hold lisinopril with aKI

## 2024-01-18 ENCOUNTER — Inpatient Hospital Stay (HOSPITAL_COMMUNITY): Payer: Medicare Other

## 2024-01-18 DIAGNOSIS — Z434 Encounter for attention to other artificial openings of digestive tract: Secondary | ICD-10-CM

## 2024-01-18 DIAGNOSIS — Z794 Long term (current) use of insulin: Secondary | ICD-10-CM

## 2024-01-18 DIAGNOSIS — E119 Type 2 diabetes mellitus without complications: Secondary | ICD-10-CM

## 2024-01-18 DIAGNOSIS — N179 Acute kidney failure, unspecified: Secondary | ICD-10-CM | POA: Diagnosis not present

## 2024-01-18 DIAGNOSIS — Z8673 Personal history of transient ischemic attack (TIA), and cerebral infarction without residual deficits: Secondary | ICD-10-CM

## 2024-01-18 DIAGNOSIS — E1110 Type 2 diabetes mellitus with ketoacidosis without coma: Secondary | ICD-10-CM | POA: Diagnosis not present

## 2024-01-18 DIAGNOSIS — E785 Hyperlipidemia, unspecified: Secondary | ICD-10-CM

## 2024-01-18 DIAGNOSIS — D72829 Elevated white blood cell count, unspecified: Secondary | ICD-10-CM

## 2024-01-18 DIAGNOSIS — G9340 Encephalopathy, unspecified: Secondary | ICD-10-CM | POA: Diagnosis not present

## 2024-01-18 DIAGNOSIS — I442 Atrioventricular block, complete: Secondary | ICD-10-CM | POA: Diagnosis not present

## 2024-01-18 DIAGNOSIS — E871 Hypo-osmolality and hyponatremia: Secondary | ICD-10-CM

## 2024-01-18 DIAGNOSIS — I10 Essential (primary) hypertension: Secondary | ICD-10-CM

## 2024-01-18 DIAGNOSIS — S62397A Other fracture of fifth metacarpal bone, left hand, initial encounter for closed fracture: Secondary | ICD-10-CM

## 2024-01-18 LAB — BASIC METABOLIC PANEL
Anion gap: 14 (ref 5–15)
Anion gap: 13 (ref 5–15)
Anion gap: 14 (ref 5–15)
Anion gap: 11 (ref 5–15)
BUN: 46 mg/dL — ABNORMAL HIGH (ref 8–23)
BUN: 43 mg/dL — ABNORMAL HIGH (ref 8–23)
BUN: 40 mg/dL — ABNORMAL HIGH (ref 8–23)
BUN: 38 mg/dL — ABNORMAL HIGH (ref 8–23)
CO2: 23 mmol/L (ref 22–32)
CO2: 22 mmol/L (ref 22–32)
CO2: 21 mmol/L — ABNORMAL LOW (ref 22–32)
CO2: 24 mmol/L (ref 22–32)
Calcium: 9.8 mg/dL (ref 8.9–10.3)
Calcium: 9.5 mg/dL (ref 8.9–10.3)
Calcium: 9.5 mg/dL (ref 8.9–10.3)
Calcium: 9.7 mg/dL (ref 8.9–10.3)
Chloride: 98 mmol/L (ref 98–111)
Chloride: 99 mmol/L (ref 98–111)
Chloride: 98 mmol/L (ref 98–111)
Chloride: 101 mmol/L (ref 98–111)
Creatinine, Ser: 1.55 mg/dL — ABNORMAL HIGH (ref 0.61–1.24)
Creatinine, Ser: 1.55 mg/dL — ABNORMAL HIGH (ref 0.61–1.24)
Creatinine, Ser: 1.52 mg/dL — ABNORMAL HIGH (ref 0.61–1.24)
Creatinine, Ser: 1.5 mg/dL — ABNORMAL HIGH (ref 0.61–1.24)
GFR, Estimated: 44 mL/min — ABNORMAL LOW (ref >60)
GFR, Estimated: 44 mL/min — ABNORMAL LOW (ref >60)
GFR, Estimated: 45 mL/min — ABNORMAL LOW (ref >60)
GFR, Estimated: 46 mL/min — ABNORMAL LOW (ref >60)
Glucose, Bld: 107 mg/dL — ABNORMAL HIGH (ref 70–99)
Glucose, Bld: 186 mg/dL — ABNORMAL HIGH (ref 70–99)
Glucose, Bld: 270 mg/dL — ABNORMAL HIGH (ref 70–99)
Glucose, Bld: 140 mg/dL — ABNORMAL HIGH (ref 70–99)
Potassium: 4.1 mmol/L (ref 3.5–5.1)
Potassium: 3.9 mmol/L (ref 3.5–5.1)
Potassium: 3.7 mmol/L (ref 3.5–5.1)
Potassium: 3.5 mmol/L (ref 3.5–5.1)
Sodium: 135 mmol/L (ref 135–145)
Sodium: 134 mmol/L — ABNORMAL LOW (ref 135–145)
Sodium: 133 mmol/L — ABNORMAL LOW (ref 135–145)
Sodium: 136 mmol/L (ref 135–145)

## 2024-01-18 LAB — BLOOD CULTURE ID PANEL (REFLEXED) - BCID2

## 2024-01-18 LAB — BETA-HYDROXYBUTYRIC ACID
Beta-Hydroxybutyric Acid: 2.69 mmol/L — ABNORMAL HIGH (ref 0.05–0.27)
Beta-Hydroxybutyric Acid: 4.12 mmol/L — ABNORMAL HIGH (ref 0.05–0.27)
Beta-Hydroxybutyric Acid: 3.77 mmol/L — ABNORMAL HIGH (ref 0.05–0.27)

## 2024-01-18 LAB — CBC
HCT: 36.6 % — ABNORMAL LOW (ref 39.0–52.0)
Hemoglobin: 12 g/dL — ABNORMAL LOW (ref 13.0–17.0)
MCH: 26 pg (ref 26.0–34.0)
MCHC: 32.8 g/dL (ref 30.0–36.0)
MCV: 79.4 fL — ABNORMAL LOW (ref 80.0–100.0)
Platelets: 519 10*3/uL — ABNORMAL HIGH (ref 150–400)
RBC: 4.61 MIL/uL (ref 4.22–5.81)
RDW: 16.1 % — ABNORMAL HIGH (ref 11.5–15.5)
WBC: 19.9 10*3/uL — ABNORMAL HIGH (ref 4.0–10.5)
nRBC: 0 % (ref 0.0–0.2)

## 2024-01-18 LAB — CBG MONITORING, ED
Glucose-Capillary: 104 mg/dL — ABNORMAL HIGH (ref 70–99)
Glucose-Capillary: 105 mg/dL — ABNORMAL HIGH (ref 70–99)
Glucose-Capillary: 132 mg/dL — ABNORMAL HIGH (ref 70–99)
Glucose-Capillary: 170 mg/dL — ABNORMAL HIGH (ref 70–99)
Glucose-Capillary: 219 mg/dL — ABNORMAL HIGH (ref 70–99)
Glucose-Capillary: 257 mg/dL — ABNORMAL HIGH (ref 70–99)
Glucose-Capillary: 261 mg/dL — ABNORMAL HIGH (ref 70–99)
Glucose-Capillary: 327 mg/dL — ABNORMAL HIGH (ref 70–99)

## 2024-01-18 LAB — GLUCOSE, CAPILLARY
Glucose-Capillary: 352 mg/dL — ABNORMAL HIGH (ref 70–99)
Glucose-Capillary: 305 mg/dL — ABNORMAL HIGH (ref 70–99)
Glucose-Capillary: 191 mg/dL — ABNORMAL HIGH (ref 70–99)

## 2024-01-18 MED ORDER — DEXTROSE 50 % IV SOLN
0.0000 mL | INTRAVENOUS | Status: DC | PRN
Start: 2024-01-18 — End: 2024-01-23

## 2024-01-18 MED ORDER — INSULIN ASPART 100 UNIT/ML IJ SOLN: 0.0000 [IU] | Freq: Every day | INTRAMUSCULAR | Status: DC

## 2024-01-18 MED ORDER — INSULIN GLARGINE-YFGN 100 UNIT/ML ~~LOC~~ SOLN: 10.0000 [IU] | Freq: Every day | SUBCUTANEOUS | Status: DC

## 2024-01-18 MED ORDER — LACTATED RINGERS IV SOLN: INTRAVENOUS | Status: DC

## 2024-01-18 MED ORDER — PANTOPRAZOLE SODIUM 40 MG PO TBEC
40.0000 mg | DELAYED_RELEASE_TABLET | Freq: Two times a day (BID) | ORAL | Status: DC
  Administered 2024-01-19 – 2024-01-22 (×8): 40 mg via ORAL
  Filled 2024-01-18 (×9): qty 1

## 2024-01-18 MED ORDER — INSULIN REGULAR(HUMAN) IN NACL 100-0.9 UT/100ML-% IV SOLN
INTRAVENOUS | Status: DC
  Administered 2024-01-18: 5 [IU]/h via INTRAVENOUS

## 2024-01-18 MED ORDER — ATORVASTATIN CALCIUM 40 MG PO TABS
40.0000 mg | ORAL_TABLET | Freq: Every day | ORAL | Status: DC
Start: 2024-01-18 — End: 2024-01-23
  Administered 2024-01-19 – 2024-01-22 (×4): 40 mg via ORAL
  Filled 2024-01-18 (×5): qty 1

## 2024-01-18 MED ORDER — ASPIRIN 81 MG PO CHEW
81.0000 mg | CHEWABLE_TABLET | Freq: Every day | ORAL | Status: DC
  Administered 2024-01-19 – 2024-01-22 (×4): 81 mg via ORAL
  Filled 2024-01-18 (×5): qty 1

## 2024-01-18 MED ORDER — ENOXAPARIN SODIUM 40 MG/0.4ML IJ SOSY
40.0000 mg | PREFILLED_SYRINGE | INTRAMUSCULAR | Status: DC
  Administered 2024-01-18 – 2024-01-22 (×5): 40 mg via SUBCUTANEOUS
  Filled 2024-01-18 (×5): qty 0.4

## 2024-01-18 MED ORDER — BRIMONIDINE TARTRATE 0.2 % OP SOLN
1.0000 [drp] | Freq: Two times a day (BID) | OPHTHALMIC | Status: DC
  Administered 2024-01-19 – 2024-01-22 (×8): 1 [drp] via OPHTHALMIC
  Filled 2024-01-18: qty 5

## 2024-01-18 MED ORDER — DORZOLAMIDE HCL-TIMOLOL MAL 2-0.5 % OP SOLN
1.0000 [drp] | Freq: Two times a day (BID) | OPHTHALMIC | Status: DC
  Administered 2024-01-19 – 2024-01-22 (×8): 1 [drp] via OPHTHALMIC
  Filled 2024-01-18: qty 10

## 2024-01-18 MED ORDER — LATANOPROST 0.005 % OP SOLN
1.0000 [drp] | Freq: Every day | OPHTHALMIC | Status: DC
  Administered 2024-01-18 – 2024-01-21 (×4): 1 [drp] via OPHTHALMIC
  Filled 2024-01-18: qty 2.5

## 2024-01-18 MED ORDER — AMLODIPINE BESYLATE 10 MG PO TABS
10.0000 mg | ORAL_TABLET | Freq: Every day | ORAL | Status: DC
Start: 2024-01-18 — End: 2024-01-23
  Administered 2024-01-18 – 2024-01-22 (×5): 10 mg via ORAL
  Filled 2024-01-18 (×2): qty 1
  Filled 2024-01-18: qty 2
  Filled 2024-01-18 (×2): qty 1

## 2024-01-18 MED ORDER — CLOPIDOGREL BISULFATE 75 MG PO TABS
75.0000 mg | ORAL_TABLET | Freq: Every day | ORAL | Status: DC
  Administered 2024-01-19 – 2024-01-22 (×4): 75 mg via ORAL
  Filled 2024-01-18 (×5): qty 1

## 2024-01-18 MED ORDER — INSULIN ASPART 100 UNIT/ML IJ SOLN
0.0000 [IU] | INTRAMUSCULAR | Status: DC
  Administered 2024-01-18: 11 [IU] via SUBCUTANEOUS
  Administered 2024-01-18: 3 [IU] via SUBCUTANEOUS
  Administered 2024-01-18: 11 [IU] via SUBCUTANEOUS
  Administered 2024-01-19: 8 [IU] via SUBCUTANEOUS
  Administered 2024-01-19 (×2): 5 [IU] via SUBCUTANEOUS
  Administered 2024-01-19: 2 [IU] via SUBCUTANEOUS
  Administered 2024-01-20: 15 [IU] via SUBCUTANEOUS
  Administered 2024-01-20: 8 [IU] via SUBCUTANEOUS
  Administered 2024-01-20: 5 [IU] via SUBCUTANEOUS
  Administered 2024-01-20: 8 [IU] via SUBCUTANEOUS

## 2024-01-18 MED ORDER — INSULIN ASPART 100 UNIT/ML IJ SOLN: 0.0000 [IU] | Freq: Three times a day (TID) | INTRAMUSCULAR | Status: DC

## 2024-01-18 MED ORDER — SODIUM CHLORIDE 0.9 % IV SOLN: INTRAVENOUS | Status: DC

## 2024-01-18 MED ORDER — INSULIN GLARGINE-YFGN 100 UNIT/ML ~~LOC~~ SOLN: 20.0000 [IU] | Freq: Every day | SUBCUTANEOUS | Status: DC

## 2024-01-18 MED ORDER — INSULIN GLARGINE-YFGN 100 UNIT/ML ~~LOC~~ SOLN
10.0000 [IU] | Freq: Every day | SUBCUTANEOUS | Status: DC
  Administered 2024-01-18: 10 [IU] via SUBCUTANEOUS
  Filled 2024-01-18 (×3): qty 0.1

## 2024-01-18 MED ORDER — DEXTROSE IN LACTATED RINGERS 5 % IV SOLN: INTRAVENOUS | Status: DC

## 2024-01-18 NOTE — ED Notes (Addendum)
After speaking to Rathorne, MD.   Pt is to stay on insulin drip, monitor CBG, and keep continuous D5LR

## 2024-01-18 NOTE — Progress Notes (Addendum)
Patient ID: Andre Stout, male   DOB: Jul 16, 1940, 84 y.o.   MRN: [id number] Asked to see pt 's GB drain for ? displacement. He was on our OP schedule for elective GB drain exchange on 1/17. Presented to ED now with confusion/DKA. KUB today shows appropriate positioning of GB drain- no change from previous exam. Images reviewed by Dr. Milford Cage. Drain is intact and flushes without reflux out insertion site, green bile in drain bag. No need for any urgent intervention today. If pt remains inhouse can set up for routine drain exchange next week.

## 2024-01-18 NOTE — Progress Notes (Signed)
Progress Note   Patient: Andre Stout LOV:[id number] DOB: [birth year] DOA: 01/17/2024     1 DOS: the patient was seen and examined on 01/18/2024   Brief hospital course: Taken from H&P.   Andre Stout is a 84 y.o. 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 He has been weak, vomiting and has not eaten in 4-5 days. He has been drinking plenty of fluids. He had an appointment today to make sure his drain wasn't dislodged, but he was so weak she brought him to ED.  Had episode of 4 emesis in the last 2 days.  He states his drain has been draining fine until 2 days ago. She typically drains it 2x/day, but hasn't emptied for past 2 days. Last exchanged drain on 12/19/23.   Hemodynamically stable on presentation, labs with leukocytosis at 19.3, BUN 52, creatinine 2.08, anion gap of 19, blood glucose 424, pseudohyponatremia secondary to hyperglycemia, beta-hydroxybutyrate acid 3.24, UA with ketone, protein and glucose along with leukocytosis and many bacteria.  1/18: Blood pressure mildly elevated, restarting home amlodipine.  Gap has been closed x 4 so transitioning to basal and short-acting from insulin infusion. Urine culture ordered as add-on. Pending IR evaluation for cholecystostomy tube  Assessment and Plan: * 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 (HCC) CBG improved and gap closed so he was transitioned to basal and short-acting.  A1c of 10.7 in November 2024. -Continue with Semglee and SSI  Acute encephalopathy Likely secondary to DKA/hyperglycemia and UTI  Cultures pending, continue rocephin CT head negative for acute finding Delirium precautions   Heart block AV complete [facility]) Patient is asymptomatic. Follows with cardiology Dr. Ladona Ridgel, on watchful waiting.   AKI (acute kidney injury)  (HCC) Creatinine baseline around 1.1, presenting with 2.08 Slowly improving, currently at 1.50 Strict I/O Hold nephrotoxic drugs Continue IVF hydration     fracture of distal diaphyseal metaphyseal junction of the 5th left metacarpal with dorsal angulation Ulnar gutter splint  Ortho consulted: Dr. Yehuda Budd and recommended follow up in one week  Not a candidate for surgery   Cholecystostomy care [facility]) Cholecystitis s/p cholecystostomy in 07/2022 Drain last exchanged 12/19/23 Daughter concerned it may have migrated as not has much output over last 2 days with nausea.  Has drainage in tube at time of admit, denies abdominal pain  IR evaluated him and there is no need to replace at this time, they will arrange next week for schedule exchange. -Continue with drain care   Leukocytosis Multifactorial with concern of UTI and DKA. Patient did not met sepsis criteria-sepsis ruled out Urine cultures pending-ordered as add-on 1/4 blood culture bottle with gram-positive cocci identified as strep agalactiae which can be contaminant. -Repeat blood cultures -Continue with ceftriaxone -Follow-up final culture results  Hyponatremia Pseudohyponatremia. Corrects to normal with elevated glucose   Benign essential HTN Blood pressure within goal. -Keep holding home antihypertensives-we will restart as appropriate  History of CVA (cerebrovascular accident) Continue medical management   Hyperlipidemia Continue lipitor    Subjective: Patient was seen and examined today.  Oriented to name only.  Denies any pain.  Physical Exam: Vitals:   01/18/24 0854 01/18/24 1047 01/18/24 1215 01/18/24 1301  BP: (!) 121/49   (!) 121/52  Pulse: 74 79 82 82  Resp: 18 16 15 20   Temp: 98.4 F (36.9 C) 98.1 F (36.7  C)  98.1 F (36.7 C)  TempSrc:  Oral  Oral  SpO2: 100% 100% 100% 100%  Weight:      Height:       General.  Frail elderly man, in no acute distress. Pulmonary.  Lungs clear bilaterally,  normal respiratory effort. CV.  Regular rate and rhythm, no JVD, rub or murmur. Abdomen.  Soft, nontender, nondistended, BS positive. CNS.  Alert and oriented to name only.  No focal neurologic deficit. Extremities.  No edema, no cyanosis, pulses intact and symmetrical.  Data Reviewed: Prior data reviewed  Family Communication: Talked with daughter on phone.  Disposition: Status is: Inpatient Remains inpatient appropriate because: Severity of illness  Planned Discharge Destination:  To be determined  DVT prophylaxis.  Lovenox Time spent: 50 minutes  This record has been created using Conservation officer, historic buildings. Errors have been sought and corrected,but may not always be located. Such creation errors do not reflect on the standard of care.   Author: Arnetha Courser, MD 01/18/2024 1:49 PM  For on call review [email or web address].

## 2024-01-18 NOTE — Assessment & Plan Note (Signed)
Blood pressure started trending up. -Restarting home amlodipine -Keep holding lisinopril

## 2024-01-18 NOTE — Evaluation (Signed)
Physical Therapy Evaluation Patient Details Name: Andre Stout MRN: [id number] DOB: [birth year] Today's Date: 01/18/2024  History of Present Illness  84 yo male presents to ED 1/17 with AMS, DKA, recent fall. Pt found to have fracture of distal diaphyseal metaphyseal junction of the 5th left metacarpal with dorsal angulation, ulnar gutter splint donned. CTH negative for acute findings. PMH: AV block, hypertension, type 2 diabetes, prostate cancer, glaucoma, HTNm, Cholecystitis s/p cholecystostomy in 07/2022.  Clinical Impression   Pt presents with debility, AMS, impaired balance with recent history of falls, difficulty caring for self as pt soiled upon PT arrival requiring total care for clean up, and decreased activity tolerance. Pt to benefit from acute PT to address deficits. Pt requiring mod-max assist for bed mobility and transfer into standing at EOB, unable to progress to gait given weakness and posterior bias in standing. Patient will benefit from continued inpatient follow up therapy, <3 hours/day. PT to progress mobility as tolerated, and will continue to follow acutely.          If plan is discharge home, recommend the following: A lot of help with walking and/or transfers;A lot of help with bathing/dressing/bathroom   Can travel by private vehicle   No    Equipment Recommendations None recommended by PT  Recommendations for Other Services       Functional Status Assessment Patient has had a recent decline in their functional status and demonstrates the ability to make significant improvements in function in a reasonable and predictable amount of time.     Precautions / Restrictions Precautions Precautions: Fall Precaution Comments: supposed to have L ulnar gutter splint, this is doffed upon PT arrival and at bedside and bilat hand mittens donned Restrictions Weight Bearing Restrictions Per Provider Order: Yes LUE Weight Bearing Per Provider Order: Non weight bearing  (assumed given fx)      Mobility  Bed Mobility Overal bed mobility: Needs Assistance Bed Mobility: Supine to Sit, Sit to Supine     Supine to sit: Mod assist Sit to supine: Max assist   General bed mobility comments: assist for trunk and LE management, boost up in bed upon return to supine    Transfers Overall transfer level: Needs assistance Equipment used: 1 person hand held assist Transfers: Sit to/from Stand Sit to Stand: Mod assist           General transfer comment: assist for rise and steady, pt with difficulty bringing LEs underneath self for initial rise. stand x2 from EOB, unable to take steps away from EOB given imbalance and posterior bias    Ambulation/Gait                  Stairs            Wheelchair Mobility     Tilt Bed    Modified Rankin (Stroke Patients Only)       Balance Overall balance assessment: Needs assistance, History of Falls Sitting-balance support: Feet supported, Single extremity supported Sitting balance-Leahy Scale: Fair   Postural control: Posterior lean Standing balance support: Bilateral upper extremity supported, During functional activity Standing balance-Leahy Scale: Poor                               Pertinent Vitals/Pain Pain Assessment Pain Assessment: Faces Faces Pain Scale: Hurts little more Pain Location: scrotum, with brief change Pain Descriptors / Indicators: Grimacing, Guarding, Moaning Pain Intervention(s): Limited activity within patient's tolerance, Monitored  during session, Repositioned    Home Living Family/patient expects to be discharged to:: Private residence Living Arrangements: Children (daughter) Available Help at Discharge: Family;Available 24 hours/day Type of Home: House Home Access: Stairs to enter;Ramped entrance   Entrance Stairs-Number of Steps: 5 - information taken from chart review as pt cannot state   Home Layout: One level Home Equipment: Clinical biochemist (2 wheels);Wheelchair - manual;Shower seat      Prior Function Prior Level of Function : Needs assist;Patient poor historian/Family not available             Mobility Comments: pt states he uses RW for gait, uncertain of pt accuracy with history giving given AMS ADLs Comments: pt endorses independence with ADLs, unsure of accuracy     Extremity/Trunk Assessment   Upper Extremity Assessment Upper Extremity Assessment: Defer to OT evaluation    Lower Extremity Assessment Lower Extremity Assessment: Generalized weakness;Difficult to assess due to impaired cognition    Cervical / Trunk Assessment Cervical / Trunk Assessment: Normal  Communication   Communication Communication: Hearing impairment Cueing Techniques: Gestural cues;Verbal cues  Cognition Arousal: Alert Behavior During Therapy: Flat affect Overall Cognitive Status: Impaired/Different from baseline Area of Impairment: Orientation, Following commands, Safety/judgement, Problem solving                 Orientation Level: Disoriented to, Time, Situation     Following Commands: Follows one step commands with increased time, Follows one step commands inconsistently Safety/Judgement: Decreased awareness of safety, Decreased awareness of deficits   Problem Solving: Slow processing, Requires verbal cues          General Comments General comments (skin integrity, edema, etc.): soiled in urine and stool upon PT arrival, PT assisting in pericare and linen change in ED    Exercises     Assessment/Plan    PT Assessment Patient needs continued PT services  PT Problem List Decreased strength;Decreased mobility;Decreased activity tolerance;Decreased balance;Decreased knowledge of use of DME;Pain;Cardiopulmonary status limiting activity;Decreased safety awareness       PT Treatment Interventions DME instruction;Therapeutic activities;Gait training;Therapeutic exercise;Patient/family education;Balance  training;Stair training;Functional mobility training;Neuromuscular re-education    PT Goals (Current goals can be found in the Care Plan section)  Acute Rehab PT Goals PT Goal Formulation: With patient Time For Goal Achievement: 02/01/24 Potential to Achieve Goals: Good    Frequency Min 1X/week     Co-evaluation               AM-PAC PT "6 Clicks" Mobility  Outcome Measure Help needed turning from your back to your side while in a flat bed without using bedrails?: A Lot Help needed moving from lying on your back to sitting on the side of a flat bed without using bedrails?: A Lot Help needed moving to and from a bed to a chair (including a wheelchair)?: A Lot Help needed standing up from a chair using your arms (e.g., wheelchair or bedside chair)?: A Lot Help needed to walk in hospital room?: A Lot Help needed climbing 3-5 steps with a railing? : A Lot 6 Click Score: 12    End of Session   Activity Tolerance: Patient limited by fatigue Patient left: in bed;with call bell/phone within reach;Other (comment) (on ED stretcher, all rails up, bilat mitts donned) Nurse Communication: Mobility status (unable to locate RN, secure chatted) PT Visit Diagnosis: Other abnormalities of gait and mobility (R26.89);Muscle weakness (generalized) (M62.81)    Time: 8413-2440 PT Time Calculation (min) (ACUTE ONLY): 24 min  Charges:   PT Evaluation $PT Eval Low Complexity: 1 Low PT Treatments $Therapeutic Activity: 8-22 mins PT General Charges $$ ACUTE PT VISIT: 1 Visit         Marye Round, PT DPT Acute Rehabilitation Services Secure Chat Preferred  Office [phone]   Andre Stout E Stroup 01/18/2024, 11:05 AM

## 2024-01-18 NOTE — ED Notes (Addendum)
Pt given medication, started to chew on it, could not follow commands to swallow with water. Pt too altered to follow commands. SLP eval in place. Pt also started to have hiccups after administration

## 2024-01-18 NOTE — ED Notes (Signed)
Insulin drip stopped due to  CBG of 104. Contacting MD now

## 2024-01-18 NOTE — Inpatient Diabetes Management (Signed)
Inpatient Diabetes Program Recommendations  AACE/ADA: New Consensus Statement on Inpatient Glycemic Control   Target Ranges:  Prepandial:   less than 140 mg/dL      Peak postprandial:   less than 180 mg/dL (1-2 hours)      Critically ill patients:  140 - 180 mg/dL    Latest Reference Range & Units 01/18/24 00:49 01/18/24 02:19 01/18/24 03:48 01/18/24 04:57 01/18/24 06:09 01/18/24 07:28  Glucose-Capillary 70 - 99 mg/dL 063 (H) 016 (H) 010 (H) 261 (H) 257 (H) 132 (H)    Latest Reference Range & Units 01/17/24 13:42 01/18/24 02:21  Beta-Hydroxybutyric Acid 0.05 - 0.27 mmol/L 3.24 (H) 2.69 (H)    Latest Reference Range & Units 01/17/24 13:42  CO2 22 - 32 mmol/L 20 (L)  Glucose 70 - 99 mg/dL 932 (H)  BUN 8 - 23 mg/dL 52 (H)  Creatinine 3.55 - 1.24 mg/dL 7.32 (H)  Calcium 8.9 - 10.3 mg/dL 20.2  Anion gap 5 - 15  19 (H)    Review of Glycemic Control  Diabetes history: DM2 Outpatient Diabetes medications: 70/30 20 units QAM, 70/30 15 units QPM Current orders for Inpatient glycemic control: IV insulin  Inpatient Diabetes Program Recommendations:    Insulin: Once provider is ready to transition from IV to SQ insulin, please consider ordering Semglee 8 units Q24H, CBGs Q4H, and Novolog 0-9 units Q4H.  NOTE: Noted consult for Diabetes Coordinator. Diabetes Coordinator is not on campus over the weekend but available by pager from 8am to 5pm for questions or concerns. Chart reviewed. Patient admitted with DKA, acute encephalopathy, AKI, and complete heart block. DKA treated with IV insulin.  Per chart, patient sees Dr. Elvera Lennox (Endocrinologist) and was last seen 11/14/23. Per office note on 11/14/23, "Upon discussion with daughter over the phone, patient was not able to get the 75/25 Humalog mix due to the fact that pharmacy tried to dispense only 1 month supply and insurance did not cover this... Therefore, they have been trying to stretch his NovoLog 70/30 supply.  Currently he is actually  getting approximately 50% of the doses recommended at last visit.  He is now on 20 units NovoLog 70/30 before breakfast and 20 units before dinner.  This is definitely not enough.  We will increase his dose in the morning to 30 units, add a 20 unit dose before lunch and decrease his dose before dinner to avoid low blood sugars overnight.  I also sent 4 boxes of Humalog pens to his pharmacy which should cover him for several months.  I advised him to let me know if he is not able to get this. - I suggested to:  Patient Instructions  Please change to Humalog 75/25: - 30 units before b'fast - 20 units before lunch - 15 units before dinner  Per telephone note on 11/14/23 insulin Rx was changed to Novolog 70/30.   Thanks, Orlando Penner, RN, MSN, CDCES Diabetes Coordinator Inpatient Diabetes Program [phone] (Team Pager from 8am to 5pm)

## 2024-01-18 NOTE — Hospital Course (Signed)
Taken from H&P.   Andre Stout is a 84 y.o. 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 He has been weak, vomiting and has not eaten in 4-5 days. He has been drinking plenty of fluids. He had an appointment today to make sure his drain wasn't dislodged, but he was so weak she brought him to ED.  Had episode of 4 emesis in the last 2 days.  He states his drain has been draining fine until 2 days ago. She typically drains it 2x/day, but hasn't emptied for past 2 days. Last exchanged drain on 12/19/23.   Hemodynamically stable on presentation, labs with leukocytosis at 19.3, BUN 52, creatinine 2.08, anion gap of 19, blood glucose 424, pseudohyponatremia secondary to hyperglycemia, beta-hydroxybutyrate acid 3.24, UA with ketone, protein and glucose along with leukocytosis and many bacteria.  1/18: Blood pressure mildly elevated, restarting home amlodipine.  Gap has been closed x 4 so transitioning to basal and short-acting from insulin infusion. Urine culture ordered as add-on. Pending IR evaluation for cholecystostomy tube

## 2024-01-18 NOTE — Progress Notes (Signed)
PHARMACY - PHYSICIAN COMMUNICATION CRITICAL VALUE ALERT - BLOOD CULTURE IDENTIFICATION (BCID)  Andre Stout is an 84 y.o. male who presented to [facility] on 01/17/2024 with a chief complaint of increased confusion from baseline and elevated blood sugars.  Assessment:  Patient on Rocephin for concern for UTI now with Bcx positive for 1/4 gpc chains, bcid - strep agalactiae.  Name of physician (or Provider) Contacted: MD Arnetha Courser  Current antibiotics: Rocephin  Changes to prescribed antibiotics recommended:  Given there is concern for UTI, would continue with Rocephin until urine culture results. MD is in agreement.  Results for orders placed or performed during the hospital encounter of 01/17/24  Blood Culture ID Panel (Reflexed) (Collected: 01/17/2024  5:44 PM)  Result Value Ref Range   Enterococcus faecalis NOT DETECTED NOT DETECTED   Enterococcus Faecium NOT DETECTED NOT DETECTED   Listeria monocytogenes NOT DETECTED NOT DETECTED   Staphylococcus species NOT DETECTED NOT DETECTED   Staphylococcus aureus (BCID) NOT DETECTED NOT DETECTED   Staphylococcus epidermidis NOT DETECTED NOT DETECTED   Staphylococcus lugdunensis NOT DETECTED NOT DETECTED   Streptococcus species DETECTED (A) NOT DETECTED   Streptococcus agalactiae DETECTED (A) NOT DETECTED   Streptococcus pneumoniae NOT DETECTED NOT DETECTED   Streptococcus pyogenes NOT DETECTED NOT DETECTED   A.calcoaceticus-baumannii NOT DETECTED NOT DETECTED   Bacteroides fragilis NOT DETECTED NOT DETECTED   Enterobacterales NOT DETECTED NOT DETECTED   Enterobacter cloacae complex NOT DETECTED NOT DETECTED   Escherichia coli NOT DETECTED NOT DETECTED   Klebsiella aerogenes NOT DETECTED NOT DETECTED   Klebsiella oxytoca NOT DETECTED NOT DETECTED   Klebsiella pneumoniae NOT DETECTED NOT DETECTED   Proteus species NOT DETECTED NOT DETECTED   Salmonella species NOT DETECTED NOT DETECTED   Serratia marcescens NOT DETECTED NOT  DETECTED   Haemophilus influenzae NOT DETECTED NOT DETECTED   Neisseria meningitidis NOT DETECTED NOT DETECTED   Pseudomonas aeruginosa NOT DETECTED NOT DETECTED   Stenotrophomonas maltophilia NOT DETECTED NOT DETECTED   Candida albicans NOT DETECTED NOT DETECTED   Candida auris NOT DETECTED NOT DETECTED   Candida glabrata NOT DETECTED NOT DETECTED   Candida krusei NOT DETECTED NOT DETECTED   Candida parapsilosis NOT DETECTED NOT DETECTED   Candida tropicalis NOT DETECTED NOT DETECTED   Cryptococcus neoformans/gattii NOT DETECTED NOT DETECTED    [name] 01/18/2024  11:00 AM

## 2024-01-19 ENCOUNTER — Inpatient Hospital Stay (HOSPITAL_COMMUNITY): Payer: Medicare Other

## 2024-01-19 DIAGNOSIS — I442 Atrioventricular block, complete: Secondary | ICD-10-CM

## 2024-01-19 DIAGNOSIS — N179 Acute kidney failure, unspecified: Secondary | ICD-10-CM | POA: Diagnosis not present

## 2024-01-19 DIAGNOSIS — B951 Streptococcus, group B, as the cause of diseases classified elsewhere: Secondary | ICD-10-CM | POA: Diagnosis not present

## 2024-01-19 DIAGNOSIS — R7881 Bacteremia: Secondary | ICD-10-CM

## 2024-01-19 DIAGNOSIS — G9340 Encephalopathy, unspecified: Secondary | ICD-10-CM

## 2024-01-19 DIAGNOSIS — E1110 Type 2 diabetes mellitus with ketoacidosis without coma: Secondary | ICD-10-CM | POA: Diagnosis not present

## 2024-01-19 LAB — ECHOCARDIOGRAM COMPLETE
Area-P 1/2: 6.11 cm2
Height: 65 in
S' Lateral: 1.9 cm
Weight: 2640 [oz_av]

## 2024-01-19 LAB — CBC
HCT: 34.5 % — ABNORMAL LOW (ref 39.0–52.0)
Hemoglobin: 11.3 g/dL — ABNORMAL LOW (ref 13.0–17.0)
MCH: 25.7 pg — ABNORMAL LOW (ref 26.0–34.0)
MCHC: 32.8 g/dL (ref 30.0–36.0)
MCV: 78.4 fL — ABNORMAL LOW (ref 80.0–100.0)
Platelets: 511 10*3/uL — ABNORMAL HIGH (ref 150–400)
RBC: 4.4 MIL/uL (ref 4.22–5.81)
RDW: 16.1 % — ABNORMAL HIGH (ref 11.5–15.5)
WBC: 20.7 10*3/uL — ABNORMAL HIGH (ref 4.0–10.5)
nRBC: 0 % (ref 0.0–0.2)

## 2024-01-19 LAB — BASIC METABOLIC PANEL
Anion gap: 11 (ref 5–15)
BUN: 21 mg/dL (ref 8–23)
CO2: 25 mmol/L (ref 22–32)
Calcium: 9.4 mg/dL (ref 8.9–10.3)
Chloride: 103 mmol/L (ref 98–111)
Creatinine, Ser: 1.16 mg/dL (ref 0.61–1.24)
GFR, Estimated: >60 mL/min (ref >60)
Glucose, Bld: 167 mg/dL — ABNORMAL HIGH (ref 70–99)
Potassium: 3.4 mmol/L — ABNORMAL LOW (ref 3.5–5.1)
Sodium: 139 mmol/L (ref 135–145)

## 2024-01-19 LAB — GLUCOSE, CAPILLARY
Glucose-Capillary: 131 mg/dL — ABNORMAL HIGH (ref 70–99)
Glucose-Capillary: 256 mg/dL — ABNORMAL HIGH (ref 70–99)
Glucose-Capillary: 212 mg/dL — ABNORMAL HIGH (ref 70–99)
Glucose-Capillary: 233 mg/dL — ABNORMAL HIGH (ref 70–99)
Glucose-Capillary: 235 mg/dL — ABNORMAL HIGH (ref 70–99)

## 2024-01-19 MED ORDER — POTASSIUM CHLORIDE CRYS ER 20 MEQ PO TBCR
40.0000 meq | EXTENDED_RELEASE_TABLET | Freq: Once | ORAL | Status: AC
  Administered 2024-01-19: 40 meq via ORAL
  Filled 2024-01-19: qty 2

## 2024-01-19 MED ORDER — ENSURE ENLIVE PO LIQD
237.0000 mL | Freq: Two times a day (BID) | ORAL | Status: DC
  Administered 2024-01-20 – 2024-01-21 (×3): 237 mL via ORAL

## 2024-01-19 MED ORDER — INSULIN GLARGINE-YFGN 100 UNIT/ML ~~LOC~~ SOLN
15.0000 [IU] | Freq: Every day | SUBCUTANEOUS | Status: DC
  Administered 2024-01-19 – 2024-01-20 (×2): 15 [IU] via SUBCUTANEOUS
  Filled 2024-01-19 (×2): qty 0.15

## 2024-01-19 MED ORDER — PERFLUTREN LIPID MICROSPHERE
1.0000 mL | INTRAVENOUS | Status: AC | PRN
  Administered 2024-01-19: 3 mL via INTRAVENOUS
  Filled 2024-01-19: qty 10

## 2024-01-19 MED ORDER — SODIUM CHLORIDE 0.9 % IV SOLN
12.0000 10*6.[IU] | Freq: Two times a day (BID) | INTRAVENOUS | Status: DC
  Administered 2024-01-19 – 2024-01-21 (×4): 12 10*6.[IU] via INTRAVENOUS
  Filled 2024-01-19 (×6): qty 12

## 2024-01-19 NOTE — Evaluation (Signed)
Clinical/Bedside Swallow Evaluation Patient Details  Name: Andre Stout MRN: [id number] Date of Birth: 07-Nov-1940  Today's Date: 01/19/2024 Time: SLP Start Time (ACUTE ONLY): 1455 SLP Stop Time (ACUTE ONLY): 1512 SLP Time Calculation (min) (ACUTE ONLY): 17 min  Past Medical History:  Past Medical History:  Diagnosis Date   Diabetes mellitus without complication (HCC)    Glaucoma    Heart block    complete heart block   Hypertension    Prostate cancer (HCC)    Been 3-4 years ago   Past Surgical History:  Past Surgical History:  Procedure Laterality Date   CATARACT EXTRACTION Bilateral    INSERTION PROSTATE RADIATION SEED     IR EXCHANGE BILIARY DRAIN  08/19/2023   IR EXCHANGE BILIARY DRAIN  12/19/2023   IR PERC CHOLECYSTOSTOMY  08/17/2022   IR RADIOLOGIST EVAL & MGMT  09/28/2022   IR RADIOLOGIST EVAL & MGMT  10/10/2022   HPI:  Pt s a 84 y.o. male who presented with AMS, DKA and streptococcal bacteremia, recent fall. CT head and CXR negative for acute changes. 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    Assessment / Plan / Recommendation  Clinical Impression  Pt was seen for bedside swallow evaluation and he denied a history of dysphagia. Oral mechanism exam was limited due to pt's difficulty following some commands; however, oral motor strength and ROM appeared grossly WFL. Pt was edentulous with maxillary dentures only. He tolerated all solids and liquids without signs or symptoms of aspiration. A mild oral/pharyngeal delay is suspected with puree considering time of bolus presentation versus that of hyolaryngeal elevation, but this was still functional and oral clearance was adequate. A regular texture diet with thin liquids is recommended at this time and further acute skilled SLP services are not clinically indicated for swallowing. SLP Visit Diagnosis: Dysphagia, unspecified (R13.10)    Aspiration Risk  Mild  aspiration risk    Diet Recommendation Regular;Thin liquid    Liquid Administration via: Cup;Straw Medication Administration: Whole meds with puree (or crushed; as tolerated) Supervision: Staff to assist with self feeding Postural Changes: Seated upright at 90 degrees    Other  Recommendations Oral Care Recommendations: Oral care BID    Recommendations for follow up therapy are one component of a multi-disciplinary discharge planning process, led by the attending physician.  Recommendations may be updated based on patient status, additional functional criteria and insurance authorization.  Follow up Recommendations No SLP follow up      Assistance Recommended at Discharge    Functional Status Assessment    Frequency and Duration            Prognosis        Swallow Study   General Date of Onset: 01/18/24 HPI: Pt s a 84 y.o. male who presented with AMS, DKA and streptococcal bacteremia, recent fall. CT head and CXR negative for acute changes. 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 Type of Study: Bedside Swallow Evaluation Previous Swallow Assessment: none Diet Prior to this Study: Regular Temperature Spikes Noted: No Respiratory Status: Room air History of Recent Intubation: No Behavior/Cognition: Alert;Cooperative;Pleasant mood Oral Cavity Assessment: Within Functional Limits Oral Care Completed by SLP: No Oral Cavity - Dentition: Dentures, top;Edentulous Vision: Functional for self-feeding Self-Feeding Abilities: Needs assist Patient Positioning: Upright in bed;Postural control adequate for testing Baseline Vocal Quality: Normal Volitional Swallow: Able to elicit  Oral/Motor/Sensory Function Overall Oral Motor/Sensory Function: Within functional limits   Ice Chips Ice chips: Within functional limits Presentation: Spoon   Thin Liquid Thin Liquid: Within functional limits Presentation: Straw;Cup     Nectar Thick Nectar Thick Liquid: Not tested   Honey Thick Honey Thick Liquid: Not tested   Puree Puree: Impaired Presentation: Spoon Oral Phase Functional Implications: Oral holding;Prolonged oral transit   Solid     Solid: Within functional limits Presentation: Self Fed     Andre Stout I. Vear Clock, MS, CCC-SLP Acute Rehabilitation Services Office number [phone]  Scheryl Marten 01/19/2024,3:19 PM

## 2024-01-19 NOTE — Progress Notes (Addendum)
PROGRESS NOTE        PATIENT DETAILS Name: GILFORD HOCKERSMITH Age: 84 y.o. Sex: male Date of Birth: 03-19-1940 Admit Date: 01/17/2024 Admitting Physician Orland Mustard, MD ZOX:[id number], Allyne Gee, FNP  Brief Summary: Patient is a 84 y.o.  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  Significant events: 1/17>> admit to [facility]  Significant studies: 1/17>> x-ray left hand: Fracture at the distal diaphyseal/metaphyseal junction of fifth metacarpal 1/17>> CXR: No PNA 1/17>> CT head: No acute finding 1/17>> CT C-spine: No acute finding.  Significant microbiology data: 1/17>> blood culture 1/2: Gram-positive cocci in chains (BCID-strep agalactiae) 1/18>> blood culture: Pending  Procedures: None  Consults: None  Subjective: Sleeping comfortably when I walked in-easily awoke-answer simple questions appropriately.  Denies any pain in the belly.  Objective: Vitals: Blood pressure 124/70, pulse 83, temperature 98.9 F (37.2 C), temperature source Oral, resp. rate 16, height 5\' 5"  (1.651 m), weight 74.8 kg, SpO2 97%.   Exam: Gen Exam:Alert awake-not in any distress HEENT:atraumatic, normocephalic Chest: B/L clear to auscultation anteriorly CVS:S1S2 regular Abdomen:soft non tender, non distended-cholecystostomy tube in place Extremities:no edema Neurology: Non focal-some generalized weakness. Skin: no rash  Pertinent Labs/Radiology:    Latest Ref Rng & Units 01/19/2024    5:10 AM 01/18/2024    5:36 AM 01/17/2024    1:54 PM  CBC  WBC 4.0 - 10.5 K/uL 20.7  19.9    Hemoglobin 13.0 - 17.0 g/dL 40.9  81.1  91.4   Hematocrit 39.0 - 52.0 % 34.5  36.6  43.0   Platelets 150 - 400 K/uL 511  519      Lab Results  Component Value Date   NA 139  01/19/2024   K 3.4 (L) 01/19/2024   CL 103 01/19/2024   CO2 25 01/19/2024     Assessment/Plan: DKA Resolved with IV insulin/IVF Likely triggered by possible bacteremia  DM-2 with uncontrolled hyperglycemia/DKA (A1c 10.07 November 2023) CBGs relatively stable Increase Semglee to 15 units daily-continue SSI Follow/optimize.  Recent Labs    01/18/24 1941 01/18/24 2322 01/19/24 0400  GLUCAP 305* 191* 131*   Acute metabolic encephalopathy Likely due to DKA/bacteremia Improved-relatively awake/alert this morning CT imaging negative Maintain delirium precautions  Streptococcal bacteremia Afebrile but still with significant leukocytosis IV Rocephin Repeat cultures pending TTE ordered  History of cholecystitis-s/p cholecystostomy tube in place Evaluated by IR-cholecystostomy tube appears to be in place-IR recommending continuing with current care and keeping next appointment for exchange.  History of intermittent complete heart block Telemetry monitoring Avoid AV nodal blocking agents Per prior notes-is being monitored closely by EP/cardiology  History of CVA Appears to be moving all 4 extremities Aspirin/Plavix/statin  HTN BP stable Continue amlodipine  Hypokalemia Replete/recheck  AKI Likely hemodynamically mediated Resolved with supportive care  Fracture of distal middle Faisal diaphyseal junction-left fifth metacarpal Per prior notes-Ortho consulted-ulnar splint-Dr. Yehuda Budd recommended follow-up in 1 week.  BMI: Estimated body mass index is 27.46 kg/m as calculated from the following:   Height as of this encounter: 5\' 5"  (1.651 m).   Weight as of this encounter: 74.8 kg.   Code status:   Code Status: Full Code   DVT Prophylaxis: enoxaparin (LOVENOX) injection 40 mg Start: 01/18/24  1830   Family Communication: Daughter [name]-[phone] updated 1/19   Disposition Plan: Status is: Inpatient Remains inpatient appropriate because: Severity  of illness   Planned Discharge Destination:Home health   Diet: Diet Order             Diet heart healthy/carb modified Room service appropriate? Yes; Fluid consistency: Thin  Diet effective now                     Antimicrobial agents: Anti-infectives (From admission, onward)    Start     Dose/Rate Route Frequency Ordered Stop   01/17/24 1745  cefTRIAXone (ROCEPHIN) 2 g in sodium chloride 0.9 % 100 mL IVPB        2 g 200 mL/hr over 30 Minutes Intravenous Every 24 hours 01/17/24 1738          MEDICATIONS: Scheduled Meds:  amLODipine  10 mg Oral Daily   aspirin  81 mg Oral Daily   atorvastatin  40 mg Oral Daily   brimonidine  1 drop Both Eyes BID WC   clopidogrel  75 mg Oral Daily   dorzolamide-timolol  1 drop Both Eyes BID WC   enoxaparin (LOVENOX) injection  40 mg Subcutaneous Q24H   insulin aspart  0-15 Units Subcutaneous Q4H   insulin glargine-yfgn  10 Units Subcutaneous Daily   latanoprost  1 drop Both Eyes QHS   pantoprazole  40 mg Oral BID AC   Continuous Infusions:  cefTRIAXone (ROCEPHIN)  IV 2 g (01/18/24 1923)   lactated ringers 100 mL/hr at 01/18/24 1541   PRN Meds:.acetaminophen **OR** acetaminophen, dextrose, ondansetron **OR** ondansetron (ZOFRAN) IV   I have personally reviewed following labs and imaging studies  LABORATORY DATA: CBC: Recent Labs  Lab 01/17/24 1342 01/17/24 1353 01/17/24 1354 01/18/24 0536 01/19/24 0510  WBC 19.3*  --   --  19.9* 20.7*  HGB 13.3 14.3 14.6 12.0* 11.3*  HCT 40.8 42.0 43.0 36.6* 34.5*  MCV 80.5  --   --  79.4* 78.4*  PLT 571*  --   --  519* 511*    Basic Metabolic Panel: Recent Labs  Lab 01/17/24 1342 01/17/24 1353 01/18/24 0106 01/18/24 0221 01/18/24 0536 01/18/24 0745 01/19/24 0510  NA 128*   < > 135 134* 133* 136 139  K 5.0   < > 4.1 3.9 3.7 3.5 3.4*  CL 89*   < > 98 99 98 101 103  CO2 20*   < > 23 22 21* 24 25  GLUCOSE 424*   < > 107* 186* 270* 140* 167*  BUN 52*   < > 46* 43* 40* 38*  21  CREATININE 2.08*   < > 1.55* 1.55* 1.52* 1.50* 1.16  CALCIUM 10.1   < > 9.8 9.5 9.5 9.7 9.4  MG 1.9  --   --   --   --   --   --   PHOS 3.1  --   --   --   --   --   --    < > = values in this interval not displayed.    GFR: Estimated Creatinine Clearance: 45.6 mL/min (by C-G formula based on SCr of 1.16 mg/dL).  Liver Function Tests: Recent Labs  Lab 01/17/24 1342  AST 18  ALT 13  ALKPHOS 106  BILITOT 2.1*  PROT 7.5  ALBUMIN 2.8*   Recent Labs  Lab 01/17/24 1342  LIPASE 22   Recent Labs  Lab 01/17/24 1810  AMMONIA 19  Coagulation Profile: No results for input(s): "INR", "PROTIME" in the last 168 hours.  Cardiac Enzymes: No results for input(s): "CKTOTAL", "CKMB", "CKMBINDEX", "TROPONINI" in the last 168 hours.  BNP (last 3 results) No results for input(s): "PROBNP" in the last 8760 hours.  Lipid Profile: No results for input(s): "CHOL", "HDL", "LDLCALC", "TRIG", "CHOLHDL", "LDLDIRECT" in the last 72 hours.  Thyroid Function Tests: Recent Labs    01/17/24 1810  TSH 2.023    Anemia Panel: Recent Labs    01/17/24 1810  VITAMINB12 331    Urine analysis:    Component Value Date/Time   COLORURINE YELLOW 01/17/2024 1647   APPEARANCEUR CLOUDY (A) 01/17/2024 1647   LABSPEC 1.016 01/17/2024 1647   PHURINE 5.0 01/17/2024 1647   GLUCOSEU >=500 (A) 01/17/2024 1647   HGBUR LARGE (A) 01/17/2024 1647   BILIRUBINUR NEGATIVE 01/17/2024 1647   KETONESUR 5 (A) 01/17/2024 1647   PROTEINUR 30 (A) 01/17/2024 1647   UROBILINOGEN 1.0 10/04/2009 1109   NITRITE NEGATIVE 01/17/2024 1647   LEUKOCYTESUR MODERATE (A) 01/17/2024 1647    Sepsis Labs: Lactic Acid, Venous    Component Value Date/Time   LATICACIDVEN 3.85 (HH) 03/04/2016 1417    MICROBIOLOGY: Recent Results (from the past 240 hours)  Culture, blood (Routine X 2) w Reflex to ID Panel     Status: None (Preliminary result)   Collection Time: 01/17/24  5:44 PM   Specimen: BLOOD RIGHT ARM  Result  Value Ref Range Status   Specimen Description BLOOD RIGHT ARM  Final   Special Requests   Final    BOTTLES DRAWN AEROBIC AND ANAEROBIC Blood Culture results may not be optimal due to an inadequate volume of blood received in culture bottles   Culture  Setup Time   Final    GRAM POSITIVE COCCI IN CHAINS IN BOTH AEROBIC AND ANAEROBIC BOTTLES CRITICAL RESULT CALLED TO, READ BACK BY AND VERIFIED WITH: PHARMD J. MILLEN [id number] AT 1055, ADC Performed at [facility], [address], [place], [place] [address]    Culture GRAM POSITIVE COCCI  Final   Report Status PENDING  Incomplete  Blood Culture ID Panel (Reflexed)     Status: Abnormal   Collection Time: 01/17/24  5:44 PM  Result Value Ref Range Status   Enterococcus faecalis NOT DETECTED NOT DETECTED Final   Enterococcus Faecium NOT DETECTED NOT DETECTED Final   Listeria monocytogenes NOT DETECTED NOT DETECTED Final   Staphylococcus species NOT DETECTED NOT DETECTED Final   Staphylococcus aureus (BCID) NOT DETECTED NOT DETECTED Final   Staphylococcus epidermidis NOT DETECTED NOT DETECTED Final   Staphylococcus lugdunensis NOT DETECTED NOT DETECTED Final   Streptococcus species DETECTED (A) NOT DETECTED Final    Comment: CRITICAL RESULT CALLED TO, READ BACK BY AND VERIFIED WITH: PHARMD J. MILLEN [id number] AT 1055, ADC    Streptococcus agalactiae DETECTED (A) NOT DETECTED Final    Comment: CRITICAL RESULT CALLED TO, READ BACK BY AND VERIFIED WITH: PHARMD J. MILLEN [id number] AT 1055, ADC    Streptococcus pneumoniae NOT DETECTED NOT DETECTED Final   Streptococcus pyogenes NOT DETECTED NOT DETECTED Final   A.calcoaceticus-baumannii NOT DETECTED NOT DETECTED Final   Bacteroides fragilis NOT DETECTED NOT DETECTED Final   Enterobacterales NOT DETECTED NOT DETECTED Final   Enterobacter cloacae complex NOT DETECTED NOT DETECTED Final   Escherichia coli NOT DETECTED NOT DETECTED Final   Klebsiella aerogenes NOT DETECTED NOT DETECTED Final    Klebsiella oxytoca NOT DETECTED NOT DETECTED Final   Klebsiella pneumoniae  NOT DETECTED NOT DETECTED Final   Proteus species NOT DETECTED NOT DETECTED Final   Salmonella species NOT DETECTED NOT DETECTED Final   Serratia marcescens NOT DETECTED NOT DETECTED Final   Haemophilus influenzae NOT DETECTED NOT DETECTED Final   Neisseria meningitidis NOT DETECTED NOT DETECTED Final   Pseudomonas aeruginosa NOT DETECTED NOT DETECTED Final   Stenotrophomonas maltophilia NOT DETECTED NOT DETECTED Final   Candida albicans NOT DETECTED NOT DETECTED Final   Candida auris NOT DETECTED NOT DETECTED Final   Candida glabrata NOT DETECTED NOT DETECTED Final   Candida krusei NOT DETECTED NOT DETECTED Final   Candida parapsilosis NOT DETECTED NOT DETECTED Final   Candida tropicalis NOT DETECTED NOT DETECTED Final   Cryptococcus neoformans/gattii NOT DETECTED NOT DETECTED Final    Comment: Performed at [facility], [address] [place], [place] [address]  Culture, blood (Routine X 2) w Reflex to ID Panel     Status: None (Preliminary result)   Collection Time: 01/17/24  6:22 PM   Specimen: BLOOD RIGHT HAND  Result Value Ref Range Status   Specimen Description BLOOD RIGHT HAND  Final   Special Requests   Final    BOTTLES DRAWN AEROBIC AND ANAEROBIC Blood Culture results may not be optimal due to an inadequate volume of blood received in culture bottles   Culture   Final    NO GROWTH < 24 HOURS Performed at [facility], [address] [place], [place] [address]    Report Status PENDING  Incomplete    RADIOLOGY STUDIES/RESULTS: DG Abd 1 View Result Date: 01/18/2024 CLINICAL DATA:  Cholecystostomy tube dysfunction EXAM: ABDOMEN - 1 VIEW COMPARISON:  08/18/2023 abdominal CT 12/19/2023 fluoroscopy FINDINGS: Cholecystostomy tube overlaps the right upper quadrant, near the inferior liver margin shadow, stable from prior imaging. No dilated bowel or concerning mass effect/calcification. Prostate  brachytherapy seeds. Extensive artifact from support hardware. IMPRESSION: Similar positioning of cholecystostomy tube when compared to fluoroscopy 12/19/2023 Electronically Signed   By: Tiburcio Pea M.D.   On: 01/18/2024 08:50   CT Cervical Spine Wo Contrast Result Date: 01/17/2024 CLINICAL DATA:  Fall.  Trauma to the head and neck. EXAM: CT CERVICAL SPINE WITHOUT CONTRAST TECHNIQUE: Multidetector CT imaging of the cervical spine was performed without intravenous contrast. Multiplanar CT image reconstructions were also generated. RADIATION DOSE REDUCTION: This exam was performed according to the departmental dose-optimization program which includes automated exposure control, adjustment of the mA and/or kV according to patient size and/or use of iterative reconstruction technique. COMPARISON:  None Available. FINDINGS: Alignment: No traumatic malalignment. Somewhat exaggerated cervical lordosis but without listhesis. Skull base and vertebrae: No regional fracture or focal bone lesion. 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 No significant regional facet arthropathy. Upper chest: Negative Other: None IMPRESSION: No acute or traumatic finding. 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 Electronically Signed   By: Paulina Fusi M.D.   On: 01/17/2024 14:53   DG Chest Portable 1 View Result Date: 01/17/2024 CLINICAL DATA:  Fall EXAM: PORTABLE CHEST 1 VIEW COMPARISON:  07/31/2022 FINDINGS: Heart size upper limits of normal. Ordinary thoracic aortic atherosclerosis. The lungs are clear. No infiltrate, collapse or effusion. No pneumothorax. No acute regional bone finding. IMPRESSION: No active disease. Aortic atherosclerosis. Electronically Signed   By: Paulina Fusi M.D.   On: 01/17/2024 14:52  DG Hand Complete Left Result Date:  01/17/2024 CLINICAL DATA:  Fall with trauma to the and. EXAM: LEFT HAND - COMPLETE 3+ VIEW COMPARISON:  None FINDINGS: Fracture at the distal diaphyseal metaphyseal junction of the fifth metacarpal with dorsal angulation. Boxer's fracture configuration. No other regional acute injury. 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 IMPRESSION: Fracture at the distal diaphyseal metaphyseal junction of the fifth metacarpal with dorsal angulation. Electronically Signed   By: Paulina Fusi M.D.   On: 01/17/2024 14:51   CT Head Wo Contrast Result Date: 01/17/2024 CLINICAL DATA:  Fall with trauma to the head and neck EXAM: CT HEAD WITHOUT CONTRAST TECHNIQUE: Contiguous axial images were obtained from the base of the skull through the vertex without intravenous contrast. RADIATION DOSE REDUCTION: This exam was performed according to the departmental dose-optimization program which includes automated exposure control, adjustment of the mA and/or kV according to patient size and/or use of iterative reconstruction technique. COMPARISON:  10/16/2022 FINDINGS: Brain: No acute or traumatic finding. Generalized volume loss. Chronic small-vessel ischemic changes of the white matter. Old small vessel infarctions of the basal ganglia and thalami. No mass, hemorrhage, hydrocephalus or extra-axial collection. Vascular: There is atherosclerotic calcification of the major vessels at the base of the brain. Skull: Negative Sinuses/Orbits: Paranasal sinuses show mild seasonal mucosal thickening but no advanced finding. Extensive mastoid effusion on the left. Orbits negative. Other: None IMPRESSION: 1. No acute or traumatic finding. Generalized volume loss. Chronic small-vessel ischemic changes of the white matter. Old small vessel infarctions of the basal ganglia and thalami. 2. Extensive mastoid effusion on the left. Electronically Signed   By: Paulina Fusi M.D.   On: 01/17/2024 14:50     LOS: 2 days   Jeoffrey Massed, MD  [facility]    To contact the attending provider between 7A-7P or the covering provider during after hours 7P-7A, please log into the web site [email or web address] and access using universal [place] password for that web site. If you do not have the password, please call the hospital operator.  01/19/2024, 9:01 AM

## 2024-01-19 NOTE — Progress Notes (Signed)
Pharmacy Antibiotic Note  Andre Stout is a 84 y.o. male admitted on 01/17/2024 with bacteremia.  Pharmacy has been consulted for penicillin g dosing for group B strep bacteremia.   Patient's WBC remains elevated at 20.7k, afebrile. Patient previously on ceftriaxone, now switching to continuous penicillin g infusion.   Plan: Start penicillin g 12 million continuous infusion every 12 hours   Follow up TTE Follow up cultures and ID recommendations  Height: 5\' 5"  (165.1 cm) Weight: 74.8 kg (165 lb) IBW/kg (Calculated) : 61.5  Temp (24hrs), Avg:98.6 F (37 C), Min:98 F (36.7 C), Max:99.3 F (37.4 C)  Recent Labs  Lab 01/17/24 1342 01/17/24 1353 01/18/24 0106 01/18/24 0221 01/18/24 0536 01/18/24 0745 01/19/24 0510  WBC 19.3*  --   --   --  19.9*  --  20.7*  CREATININE 2.08*   < > 1.55* 1.55* 1.52* 1.50* 1.16   < > = values in this interval not displayed.    Estimated Creatinine Clearance: 45.6 mL/min (by C-G formula based on SCr of 1.16 mg/dL).    No Known Allergies  Antimicrobials this admission: Ceftriaxone 1/17 >> 1/19  Dose adjustments this admission: N/A  Microbiology results: 1/17 BCx: group p strep agalactiae 1/17 UCx: gram negative rods, speciation to follow 1/18 Bcx: NG <24 hours    Thank you for allowing pharmacy to be a part of this patient's care.  Blane Ohara, PharmD, BCPS PGY2 Pharmacy Resident

## 2024-01-19 NOTE — Plan of Care (Signed)

## 2024-01-19 NOTE — Evaluation (Signed)
Occupational Therapy Evaluation Patient Details Name: Andre Stout MRN: [id number] DOB: 06/24/1940 Today's Date: 01/19/2024   History of Present Illness 84 yo male presents to ED 1/17 with AMS, DKA and streptococcal bacteremia, recent fall. Pt found to have fracture of distal diaphyseal metaphyseal junction of the 5th left metacarpal with dorsal angulation, ulnar gutter splint donned. CTH negative for acute findings. PMH: AV block, hypertension, type 2 diabetes, prostate cancer, glaucoma, HTNm, Cholecystitis s/p cholecystostomy in 07/2022.   Clinical Impression   Pt unable to provide detailed history, but reports some assistance with ADLs from daughter and use of RW for functional mobility at baseline. Pt now presents with decreased cognition, decreased activity tolerance, pain in L hand, decreased B UE fine and gross motor coordination, and decreased safety and independence with functional tasks. Pt currently demonstrates ability to complete UB ADLs with Min to Max assist, LB ADLs with Total assist, and rolling L/R in the bed during ADLs with Max assist. Pt participated well throughout session. Pt in Afib rhythm during a portion of session with HR in the 80s to 90s and O2 sat largely >/94% on RA throughout session. Pt will benefit from acute skilled OT services to address deficits outlined below, decreased caregiver burden, and decreased safety and independence with functional tasks. Post acute discharge, pt will benefit from intensive inpatient skilled rehab services < 3 hours per day to maximize rehab potential.       If plan is discharge home, recommend the following: Two people to help with walking and/or transfers;A lot of help with bathing/dressing/bathroom;Assistance with cooking/housework;Assistance with feeding;Direct supervision/assist for medications management;Direct supervision/assist for financial management;Assist for transportation;Help with stairs or ramp for entrance;Supervision due  to cognitive status    Functional Status Assessment  Patient has had a recent decline in their functional status and demonstrates the ability to make significant improvements in function in a reasonable and predictable amount of time.  Equipment Recommendations  Other (comment) (defer to next level of care)    Recommendations for Other Services       Precautions / Restrictions Precautions Precautions: Fall Precaution Comments: supposed to have L ulnar gutter splint, this is doffed upon OT arrival and at bedside and bilat hand mittens donned Restrictions Weight Bearing Restrictions Per Provider Order: Yes LUE Weight Bearing Per Provider Order: Non weight bearing (assumed given fx)      Mobility Bed Mobility Overal bed mobility: Needs Assistance Bed Mobility: Rolling Rolling: Max assist, Used rails (with verbal and tactile cues for sequencing and hand placement/technique)         General bed mobility comments: supine<->sit deferred this session for pt/therapist safety due to pt funcitonal level with rolling and pt decreased ability to follow 1-step instructions and decreased safety awareness    Transfers                   General transfer comment: deferred this session      Balance                                           ADL either performed or assessed with clinical judgement   ADL Overall ADL's : Needs assistance/impaired Eating/Feeding: Minimal assistance;Bed level   Grooming: Moderate assistance;Bed level;Cueing for sequencing;Wash/dry face   Upper Body Bathing: Maximal assistance;Bed level;Cueing for sequencing;Cueing for compensatory techniques   Lower Body Bathing: Total assistance;Bed level  Upper Body Dressing : Maximal assistance;Bed level;Cueing for sequencing;Cueing for compensatory techniques   Lower Body Dressing: Total assistance;Bed level     Toilet Transfer Details (indicate cue type and reason): unable Toileting-  Clothing Manipulation and Hygiene: Total assistance;Bed level         General ADL Comments: Pt noted to have had episode of bowel incontinance upon OT arrival with pt stating he was "not sure" if he had had a BM or not. Pt requiring Total assist of OT for peri care/linen change and Max assist for changing gown. Pt with decreased activity tolerance, decreased cognition, and pain in L hand affecting functional level.     Vision Patient Visual Report: Other (comment) (Pt unable to report) Additional Comments: Pt unable to follow instructions for vision screen. Vision appears [facility] for tasks assessed     Perception         Praxis         Pertinent Vitals/Pain Pain Assessment Pain Assessment: Faces Faces Pain Scale: Hurts even more Pain Location: L hand with bed mobility and ADLs Pain Descriptors / Indicators: Grimacing, Guarding, Moaning Pain Intervention(s): Limited activity within patient's tolerance, Monitored during session, Repositioned     Extremity/Trunk Assessment Upper Extremity Assessment Upper Extremity Assessment: LUE deficits/detail;Right hand dominant;Difficult to assess due to impaired cognition;Generalized weakness;RUE deficits/detail (difficult to assess B UE ROM and strength due to pt with decreased ability to follow 1-step commands and resistant to PROM) RUE Coordination: decreased gross motor;decreased fine motor (during functional tasks, not formally assessed) LUE Deficits / Details: distal diaphyseal metaphyseal junction of the 5th left metacarpal with dorsal angulation, ulnar gutter splint ordered but doffed with B mittens applied upon OT arrival; edematous hand and forearm LUE Coordination: decreased gross motor;decreased fine motor (during functional tasks, not formally assessed)   Lower Extremity Assessment Lower Extremity Assessment: Difficult to assess due to impaired cognition;Defer to PT evaluation       Communication Communication Communication:  Hearing impairment;Difficulty communicating thoughts/reduced clarity of speech (often with mumbled speech; requires increased time for processing/word finding) Cueing Techniques: Verbal cues   Cognition Arousal: Alert Behavior During Therapy: Flat affect Overall Cognitive Status: Impaired/Different from baseline Area of Impairment: Orientation, Attention, Memory, Following commands, Safety/judgement, Awareness, Problem solving                 Orientation Level: Disoriented to, Time, Situation Current Attention Level: Focused Memory: Decreased short-term memory Following Commands: Follows one step commands with increased time, Follows one step commands inconsistently Safety/Judgement: Decreased awareness of safety, Decreased awareness of deficits Awareness: Intellectual Problem Solving: Slow processing, Decreased initiation, Difficulty sequencing, Requires verbal cues General Comments: Pt oriented to self and stating he is in a hospital, unable to describe further. Pt making good attempt to participate and following instructions throughout session but requiring increased time for processing and motor planning and with difficultly sequencing.     General Comments  Pt with AFib rhythm during a portion of session with HR in the 80s-90s. O2 sat largely >/94% on RA. However, pt with brief, rapid drop in O2 sat to 76%, though this likely inaccurate as it occured with poor pleth and pt asymptomatic and with quick return to 94% with good pleth. RN present during a portion of session.    Exercises     Shoulder Instructions      Home Living Family/patient expects to be discharged to:: Private residence Living Arrangements: Children (daughter) Available Help at Discharge: Family;Available 24 hours/day Type of Home: House Home  Access: Stairs to enter;Ramped entrance Entrance Stairs-Number of Steps: 5 - information taken from chart review as pt cannot state   Home Layout: One level      Bathroom Shower/Tub: Producer, television/film/video: Standard     Home Equipment: Agricultural consultant (2 wheels);Wheelchair - manual;Shower seat   Additional Comments: Home living primarily per chart review as pt able to provide only limited information and unable to decribe home layout or equilment. Pt does report he enjoys deer hunting and watching [employer].      Prior Functioning/Environment Prior Level of Function : Needs assist;Patient poor historian/Family not available             Mobility Comments: pt states he uses RW for gait, uncertain of pt accuracy with history ADLs Comments: pt endorses independence with ADLs then states daughter assists with ADLs but unable to provide further information        OT Problem List: Decreased activity tolerance;Decreased coordination;Decreased cognition;Decreased safety awareness;Pain;Impaired UE functional use;Increased edema      OT Treatment/Interventions: Self-care/ADL training;DME and/or AE instruction;Therapeutic activities;Therapeutic exercise;Cognitive remediation/compensation;Patient/family education;Balance training    OT Goals(Current goals can be found in the care plan section) Acute Rehab OT Goals Patient Stated Goal: pt unable to state OT Goal Formulation: Patient unable to participate in goal setting Time For Goal Achievement: 02/02/24 Potential to Achieve Goals: Good ADL Goals Pt Will Perform Eating: with supervision;sitting (with adaptive equilment as needed) Pt Will Perform Grooming: with supervision;sitting (sitting EOB with Fair balance for 5 or more minutes) Pt Will Perform Upper Body Bathing: with min assist;sitting Pt Will Perform Upper Body Dressing: with min assist;sitting Pt Will Transfer to Toilet: with min assist;stand pivot transfer;bedside commode (with least restrictive AD)  OT Frequency: Min 1X/week    Co-evaluation              AM-PAC OT "6 Clicks" Daily Activity     Outcome Measure Help from  another person eating meals?: A Little Help from another person taking care of personal grooming?: A Lot Help from another person toileting, which includes using toliet, bedpan, or urinal?: Total Help from another person bathing (including washing, rinsing, drying)?: A Lot Help from another person to put on and taking off regular upper body clothing?: A Lot Help from another person to put on and taking off regular lower body clothing?: Total 6 Click Score: 11   End of Session Nurse Communication: Mobility status;Other (comment) (Pt with episode of bowel incontinence)  Activity Tolerance: Patient tolerated treatment well;Treatment limited secondary to medical complications (Comment) (limited by decreasedc cognition and pain in L hand) Patient left: in bed;with call bell/phone within reach;with bed alarm set  OT Visit Diagnosis: Ataxia, unspecified (R27.0);Other (comment);Other symptoms and signs involving cognitive function;Pain (decreased activity tolerance)                Time: 1000-1026 OT Time Calculation (min): 26 min Charges:  OT General Charges $OT Visit: 1 Visit OT Evaluation $OT Eval Moderate Complexity: 1 Mod  [address] Co[address]., OTR/L, MA Acute Rehab [phone]   Lendon Colonel 01/19/2024, 11:01 AM

## 2024-01-19 NOTE — Consult Note (Signed)
[facility]    Date of Admission:  01/17/2024     Reason for Consult: gbs bacteremia    Referring Provider: Jeoffrey Massed     Lines:  Chronic ruq biliary percutaneous drain  Abx: 1/17-c ceftriaxone        Assessment: 84 yo male with hx dm2, intermittent complete heart block (no ppm) being observed, hx prostate cancer, s/p perc cholecystotomy since 07/2022, admitted 1/17 with dka and gbs bacteremia/severe sepsis (aki) in settign 4-5 days n/v malaise decreased appetite   1/17 one of two set bcx gbs 1/18 bcx repeat negative  Drain decreased output but normal lft and no ruq abd pain -- doesn't appear to have issue there with biliary infection this admission. Team to discuss with ir regarding drain mangement Suspect sepsis is all related to gbs infection Ua with bacteria likely assymptomatic bacteriuria but had 3 days ceftriaxone already regardless   Dka/ams/aki had resolved with supportive care  No cellulitis. And no obvious source for gbs, which is common in dm2 and cirrhosis   Given low burden disease and relatively non-severe presentation and the fact beta-hemolytic strep has much lower tendency to cause endocarditis than viridan strep, will defer IE w/u unless repeat bcx returns positive.      Plan: F/u repeat bcx F/u tte. If repeat bcx positive would get tee Will change ceftriaxone to penicillin g If repeat bcx negative would finish 10 day antibiotics and can use oral abx once stable/improving x3 days Once more alert will need to assess for focal spine/joint pain and potentially w/u if needed, especially if wbc still elevated Agree with discussing with ir regarding biliary drain management Discussed with primary team     ------------------------------------------------ Principal Problem:   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 (HCC) Active Problems:   AKI (acute kidney injury) (HCC)   Benign  essential HTN   Heart block AV complete (HCC)   Hyponatremia   History of CVA (cerebrovascular accident)   Hyperlipidemia   Leukocytosis   Acute encephalopathy   fracture of distal diaphyseal metaphyseal junction of the 5th left metacarpal with dorsal angulation   Cholecystostomy care [facility])    HPI: Andre Stout is a 84 y.o. male with hx dm2, intermittent complete heart block (no ppm) being observed, hx prostate cancer, s/p perc cholecystotomy since 07/2022, admitted 1/17 with dka and gbs bacteremia/severe sepsis (aki) in settign 4-5 days n/v malaise decreased appetite  Patient with encephalopathy, not able to give hx Hx obtained from signout/chart  Patient admitted for confusion for 4-5 days along with malaisea/generalized weakness/poor intake. He had a glf also   No subjective f/c at home Chronic cough stable No diarrhea but endorse vomiting  Drain decreased output last 2 days  ?left hand pain from fall  Afebrile on presentation; hds Wbc 19; cr 2.08; bhb 3.2 Imaging showed left 5th mc fracture; ct had no acute abnormality; ct spine no acute abnormality Ceftriaxone started for leukocytosis and ua with up to 50 wbc  Ucx gnr Bcx returned with gbs  Aki has improved today Wbc still up  Family History  Problem Relation Age of Onset   Other Mother        unknown medical history   Diabetes Father    Diabetes Paternal Grandfather     Social History   Tobacco Use   Smoking status: Former    Current packs/day: 0.00    Average packs/day: 0.5 packs/day  for 4.0 years (2.0 ttl pk-yrs)    Types: Cigarettes    Start date: 10/29/1970    Quit date: 10/29/1974    Years since quitting: 49.2   Smokeless tobacco: Never  Vaping Use   Vaping status: Never Used  Substance Use Topics   Alcohol use: No   Drug use: No    No Known Allergies  Review of Systems: ROS All Other ROS was negative, except mentioned above   Past Medical History:  Diagnosis Date   Diabetes  mellitus without complication (HCC)    Glaucoma    Heart block    complete heart block   Hypertension    Prostate cancer (HCC)    Been 3-4 years ago       Scheduled Meds:  amLODipine  10 mg Oral Daily   aspirin  81 mg Oral Daily   atorvastatin  40 mg Oral Daily   brimonidine  1 drop Both Eyes BID WC   clopidogrel  75 mg Oral Daily   dorzolamide-timolol  1 drop Both Eyes BID WC   enoxaparin (LOVENOX) injection  40 mg Subcutaneous Q24H   insulin aspart  0-15 Units Subcutaneous Q4H   insulin glargine-yfgn  15 Units Subcutaneous Daily   latanoprost  1 drop Both Eyes QHS   pantoprazole  40 mg Oral BID AC   Continuous Infusions:  cefTRIAXone (ROCEPHIN)  IV 2 g (01/18/24 1923)   PRN Meds:.acetaminophen **OR** acetaminophen, dextrose, ondansetron **OR** ondansetron (ZOFRAN) IV   OBJECTIVE: Blood pressure 120/66, pulse 85, temperature 98 F (36.7 C), temperature source Oral, resp. rate 20, height 5\' 5"  (1.651 m), weight 74.8 kg, SpO2 94%.  Physical Exam  General/constitutional: ill appearing; sleepy; arousable but quickly falling back to sleep; nonverbal; some moaning; not cooperative HEENT: Normocephalic, PER, Conj Clear Neck supple CV: rrr no mrg Lungs: clear to auscultation, normal respiratory effort Abd: Soft, Nontender -- biliary drain in place Ext: no edema Skin: No Rash Neuro: confused; not cooperative MSK: no peripheral joint swelling/tenderness/warmth    Lab Results Lab Results  Component Value Date   WBC 20.7 (H) 01/19/2024   HGB 11.3 (L) 01/19/2024   HCT 34.5 (L) 01/19/2024   MCV 78.4 (L) 01/19/2024   PLT 511 (H) 01/19/2024    Lab Results  Component Value Date   CREATININE 1.16 01/19/2024   BUN 21 01/19/2024   NA 139 01/19/2024   K 3.4 (L) 01/19/2024   CL 103 01/19/2024   CO2 25 01/19/2024    Lab Results  Component Value Date   ALT 13 01/17/2024   AST 18 01/17/2024   ALKPHOS 106 01/17/2024   BILITOT 2.1 (H) 01/17/2024       Microbiology: Recent Results (from the past 240 hours)  Culture, blood (Routine X 2) w Reflex to ID Panel     Status: Abnormal (Preliminary result)   Collection Time: 01/17/24  5:44 PM   Specimen: BLOOD RIGHT ARM  Result Value Ref Range Status   Specimen Description BLOOD RIGHT ARM  Final   Special Requests   Final    BOTTLES DRAWN AEROBIC AND ANAEROBIC Blood Culture results may not be optimal due to an inadequate volume of blood received in culture bottles   Culture  Setup Time   Final    GRAM POSITIVE COCCI IN CHAINS IN BOTH AEROBIC AND ANAEROBIC BOTTLES CRITICAL RESULT CALLED TO, READ BACK BY AND VERIFIED WITH: PHARMD J. MILLEN [id number] AT 1055, ADC    Culture (A)  Final  GROUP B STREP(S.AGALACTIAE)ISOLATED SUSCEPTIBILITIES TO FOLLOW Performed at [facility], [address] [place], [place] [address]    Report Status PENDING  Incomplete  Blood Culture ID Panel (Reflexed)     Status: Abnormal   Collection Time: 01/17/24  5:44 PM  Result Value Ref Range Status   Enterococcus faecalis NOT DETECTED NOT DETECTED Final   Enterococcus Faecium NOT DETECTED NOT DETECTED Final   Listeria monocytogenes NOT DETECTED NOT DETECTED Final   Staphylococcus species NOT DETECTED NOT DETECTED Final   Staphylococcus aureus (BCID) NOT DETECTED NOT DETECTED Final   Staphylococcus epidermidis NOT DETECTED NOT DETECTED Final   Staphylococcus lugdunensis NOT DETECTED NOT DETECTED Final   Streptococcus species DETECTED (A) NOT DETECTED Final    Comment: CRITICAL RESULT CALLED TO, READ BACK BY AND VERIFIED WITH: PHARMD J. MILLEN [id number] AT 1055, ADC    Streptococcus agalactiae DETECTED (A) NOT DETECTED Final    Comment: CRITICAL RESULT CALLED TO, READ BACK BY AND VERIFIED WITH: PHARMD J. MILLEN [id number] AT 1055, ADC    Streptococcus pneumoniae NOT DETECTED NOT DETECTED Final   Streptococcus pyogenes NOT DETECTED NOT DETECTED Final   A.calcoaceticus-baumannii NOT DETECTED NOT DETECTED Final    Bacteroides fragilis NOT DETECTED NOT DETECTED Final   Enterobacterales NOT DETECTED NOT DETECTED Final   Enterobacter cloacae complex NOT DETECTED NOT DETECTED Final   Escherichia coli NOT DETECTED NOT DETECTED Final   Klebsiella aerogenes NOT DETECTED NOT DETECTED Final   Klebsiella oxytoca NOT DETECTED NOT DETECTED Final   Klebsiella pneumoniae NOT DETECTED NOT DETECTED Final   Proteus species NOT DETECTED NOT DETECTED Final   Salmonella species NOT DETECTED NOT DETECTED Final   Serratia marcescens NOT DETECTED NOT DETECTED Final   Haemophilus influenzae NOT DETECTED NOT DETECTED Final   Neisseria meningitidis NOT DETECTED NOT DETECTED Final   Pseudomonas aeruginosa NOT DETECTED NOT DETECTED Final   Stenotrophomonas maltophilia NOT DETECTED NOT DETECTED Final   Candida albicans NOT DETECTED NOT DETECTED Final   Candida auris NOT DETECTED NOT DETECTED Final   Candida glabrata NOT DETECTED NOT DETECTED Final   Candida krusei NOT DETECTED NOT DETECTED Final   Candida parapsilosis NOT DETECTED NOT DETECTED Final   Candida tropicalis NOT DETECTED NOT DETECTED Final   Cryptococcus neoformans/gattii NOT DETECTED NOT DETECTED Final    Comment: Performed at [facility], [address] [place], [place] [address]  Culture, blood (Routine X 2) w Reflex to ID Panel     Status: None (Preliminary result)   Collection Time: 01/17/24  6:22 PM   Specimen: BLOOD RIGHT HAND  Result Value Ref Range Status   Specimen Description BLOOD RIGHT HAND  Final   Special Requests   Final    BOTTLES DRAWN AEROBIC AND ANAEROBIC Blood Culture results may not be optimal due to an inadequate volume of blood received in culture bottles   Culture   Final    NO GROWTH 2 DAYS Performed at [facility], [address], [place], [place] [address]    Report Status PENDING  Incomplete  Urine Culture (for pregnant, neutropenic or urologic patients or patients with an indwelling urinary catheter)     Status:  Abnormal (Preliminary result)   Collection Time: 01/18/24  8:41 AM   Specimen: Urine, Clean Catch  Result Value Ref Range Status   Specimen Description URINE, CLEAN CATCH  Final   Special Requests NONE  Final   Culture (A)  Final    >=100,000 COLONIES/mL GRAM  NEGATIVE RODS IDENTIFICATION TO FOLLOW Performed at [facility], [address], [place], [place] [address]    Report Status PENDING  Incomplete  Culture, blood (Routine X 2) w Reflex to ID Panel     Status: None (Preliminary result)   Collection Time: 01/18/24  3:43 PM   Specimen: BLOOD LEFT ARM  Result Value Ref Range Status   Specimen Description BLOOD LEFT ARM  Final   Special Requests   Final    BOTTLES DRAWN AEROBIC AND ANAEROBIC Blood Culture results may not be optimal due to an inadequate volume of blood received in culture bottles   Culture   Final    NO GROWTH < 24 HOURS Performed at [facility], [address] [place], [place] [address]    Report Status PENDING  Incomplete  Culture, blood (Routine X 2) w Reflex to ID Panel     Status: None (Preliminary result)   Collection Time: 01/18/24  3:46 PM   Specimen: BLOOD LEFT HAND  Result Value Ref Range Status   Specimen Description BLOOD LEFT HAND  Final   Special Requests   Final    BOTTLES DRAWN AEROBIC AND ANAEROBIC Blood Culture results may not be optimal due to an inadequate volume of blood received in culture bottles   Culture   Final    NO GROWTH < 24 HOURS Performed at [facility], [address] [place], [place] [address]    Report Status PENDING  Incomplete     Serology:    Imaging: If present, new imagings (plain films, ct scans, and mri) have been personally visualized and interpreted; radiology reports have been reviewed. Decision making incorporated into the Impression / Recommendations.  1/17 ct cspine No acute or traumatic finding. 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  1/17 ct head 1. No acute or traumatic finding. Generalized volume loss. Chronic small-vessel ischemic changes of the white matter. Old small vessel infarctions of the basal ganglia and thalami. 2. Extensive mastoid effusion on the left.  1/17 cxr No active disease. Aortic atherosclerosis.   1/17 left hand xray Fracture at the distal diaphyseal metaphyseal junction of the fifth metacarpal with dorsal angulation.  1/19 tte in progress   Raymondo Band, MD [facility] Infect[facility] [phone] pager    01/19/2024, 12:54 PM

## 2024-01-20 ENCOUNTER — Inpatient Hospital Stay (HOSPITAL_COMMUNITY): Payer: Medicare Other

## 2024-01-20 DIAGNOSIS — D72829 Elevated white blood cell count, unspecified: Secondary | ICD-10-CM

## 2024-01-20 DIAGNOSIS — G9340 Encephalopathy, unspecified: Secondary | ICD-10-CM | POA: Diagnosis not present

## 2024-01-20 DIAGNOSIS — N179 Acute kidney failure, unspecified: Secondary | ICD-10-CM | POA: Diagnosis not present

## 2024-01-20 DIAGNOSIS — I442 Atrioventricular block, complete: Secondary | ICD-10-CM | POA: Diagnosis not present

## 2024-01-20 DIAGNOSIS — E1110 Type 2 diabetes mellitus with ketoacidosis without coma: Secondary | ICD-10-CM | POA: Diagnosis not present

## 2024-01-20 DIAGNOSIS — E440 Moderate protein-calorie malnutrition: Secondary | ICD-10-CM | POA: Insufficient documentation

## 2024-01-20 HISTORY — PX: IR EXCHANGE BILIARY DRAIN: IMG6046

## 2024-01-20 LAB — CBC WITH DIFFERENTIAL/PLATELET
Abs Immature Granulocytes: 0.34 10*3/uL — ABNORMAL HIGH (ref 0.00–0.07)
Basophils Absolute: 0.1 10*3/uL (ref 0.0–0.1)
Basophils Relative: 0 %
Eosinophils Absolute: 0.1 10*3/uL (ref 0.0–0.5)
Eosinophils Relative: 1 %
HCT: 35.8 % — ABNORMAL LOW (ref 39.0–52.0)
Hemoglobin: 11.6 g/dL — ABNORMAL LOW (ref 13.0–17.0)
Immature Granulocytes: 2 %
Lymphocytes Relative: 13 %
Lymphs Abs: 2.1 10*3/uL (ref 0.7–4.0)
MCH: 26 pg (ref 26.0–34.0)
MCHC: 32.4 g/dL (ref 30.0–36.0)
MCV: 80.1 fL (ref 80.0–100.0)
Monocytes Absolute: 0.7 10*3/uL (ref 0.1–1.0)
Monocytes Relative: 4 %
Neutro Abs: 13.2 10*3/uL — ABNORMAL HIGH (ref 1.7–7.7)
Neutrophils Relative %: 80 %
Platelets: 517 10*3/uL — ABNORMAL HIGH (ref 150–400)
RBC: 4.47 MIL/uL (ref 4.22–5.81)
RDW: 16.3 % — ABNORMAL HIGH (ref 11.5–15.5)
WBC: 16.5 10*3/uL — ABNORMAL HIGH (ref 4.0–10.5)
nRBC: 0 % (ref 0.0–0.2)

## 2024-01-20 LAB — URINE CULTURE: Culture: >=100000 — AB

## 2024-01-20 LAB — GLUCOSE, CAPILLARY
Glucose-Capillary: 165 mg/dL — ABNORMAL HIGH (ref 70–99)
Glucose-Capillary: 234 mg/dL — ABNORMAL HIGH (ref 70–99)
Glucose-Capillary: 240 mg/dL — ABNORMAL HIGH (ref 70–99)
Glucose-Capillary: 261 mg/dL — ABNORMAL HIGH (ref 70–99)
Glucose-Capillary: 264 mg/dL — ABNORMAL HIGH (ref 70–99)
Glucose-Capillary: 352 mg/dL — ABNORMAL HIGH (ref 70–99)
Glucose-Capillary: 83 mg/dL (ref 70–99)

## 2024-01-20 LAB — BASIC METABOLIC PANEL
Anion gap: 14 (ref 5–15)
BUN: 15 mg/dL (ref 8–23)
CO2: 26 mmol/L (ref 22–32)
Calcium: 9.1 mg/dL (ref 8.9–10.3)
Chloride: 98 mmol/L (ref 98–111)
Creatinine, Ser: 1.03 mg/dL (ref 0.61–1.24)
GFR, Estimated: >60 mL/min (ref >60)
Glucose, Bld: 277 mg/dL — ABNORMAL HIGH (ref 70–99)
Potassium: 3.9 mmol/L (ref 3.5–5.1)
Sodium: 138 mmol/L (ref 135–145)

## 2024-01-20 LAB — MAGNESIUM: Magnesium: 1.3 mg/dL — ABNORMAL LOW (ref 1.7–2.4)

## 2024-01-20 LAB — CULTURE, BLOOD (ROUTINE X 2)

## 2024-01-20 MED ORDER — IOHEXOL 300 MG/ML  SOLN
50.0000 mL | Freq: Once | INTRAMUSCULAR | Status: AC | PRN
  Administered 2024-01-20: 10 mL

## 2024-01-20 MED ORDER — MAGNESIUM SULFATE 4 GM/100ML IV SOLN
4.0000 g | Freq: Once | INTRAVENOUS | Status: AC
  Administered 2024-01-20: 4 g via INTRAVENOUS
  Filled 2024-01-20: qty 100

## 2024-01-20 MED ORDER — LIDOCAINE HCL 1 % IJ SOLN
10.0000 mL | Freq: Once | INTRAMUSCULAR | Status: AC
  Administered 2024-01-20: 10 mL via INTRADERMAL

## 2024-01-20 MED ORDER — LIDOCAINE HCL 1 % IJ SOLN
INTRAMUSCULAR | Status: AC
  Filled 2024-01-20: qty 20

## 2024-01-20 MED ORDER — INSULIN ASPART 100 UNIT/ML IJ SOLN
4.0000 [IU] | Freq: Three times a day (TID) | INTRAMUSCULAR | Status: DC
Start: 2024-01-20 — End: 2024-01-21
  Administered 2024-01-20: 4 [IU] via SUBCUTANEOUS

## 2024-01-20 MED ORDER — IOHEXOL 300 MG/ML  SOLN
50.0000 mL | Freq: Once | INTRAMUSCULAR | Status: DC | PRN
Start: 2024-01-20 — End: 2024-01-23

## 2024-01-20 MED ORDER — SODIUM CHLORIDE 0.9% FLUSH
5.0000 mL | Freq: Three times a day (TID) | INTRAVENOUS | Status: DC
  Administered 2024-01-20 – 2024-01-22 (×7): 5 mL

## 2024-01-20 MED ORDER — INSULIN GLARGINE-YFGN 100 UNIT/ML ~~LOC~~ SOLN
22.0000 [IU] | Freq: Every day | SUBCUTANEOUS | Status: DC
  Filled 2024-01-20: qty 0.22

## 2024-01-20 MED ORDER — ADULT MULTIVITAMIN W/MINERALS CH
1.0000 | ORAL_TABLET | Freq: Every day | ORAL | Status: DC
  Administered 2024-01-20 – 2024-01-22 (×3): 1 via ORAL
  Filled 2024-01-20 (×2): qty 1

## 2024-01-20 NOTE — TOC Initial Note (Addendum)
Transition of Care [facility]) - Initial/Assessment Note    Patient Details  Name: Andre Stout MRN: [id number] Date of Birth: 01/06/1940  Transition of Care [facility]) CM/SW Contact:    Mearl Latin, LCSW Phone Number: 01/20/2024, 12:04 PM  Clinical Narrative:                 12pm-CSW received consult for possible SNF placement at time of discharge. CSW spoke with patient's daughter who confirmed patient lives with her. CSW discussed recommendation and patient's need for assistance in his care and daughter reported he typically returns home to her. She is used to his drain management and he has a walker. CSW encouraged her to visit patient to assess his needs in order to make the decision. She confirmed she will come up today and requested CSW call her back this afternoon for determination. CSW will send out SNF referrals in the even they choose SNF as MD believe patient will likely be ready for discharge tomorrow.   4:03 PM-CSW spoke with patient's daughter again. She stated that patient becomes confused in the hospital normally and it takes him about a month to return to his physical baseline. She is requesting to transition him back home with home health services instead of SNF as she is comfortable with his needs. She and her family members are rehab nurses, EMT, and a CNA. She requested [facility] as that is who he normally uses. She denied needing any additional DME (has walker, cane, BSC). She will decide on transportation needs at time of discharge.     Skilled Nursing Rehab Facilities-   [email or web address] Ratings out of 5 stars (the highest)   Name Address  Phone # Quality Care Staffing Health Inspection Overall  [facility] [address] [place] [phone] 2 2 5 5   [facility] [address] [place] [phone] 4 2 4 4   [facility] [address], [place] Otto [phone] [facility] [address],  [place] [phone] 3 1 4 3   [facility]  [address], [place] [phone] 4 4 5 5   [facility] [address] [facility] [phone] 3 2 2 2   [facility] [address], [place] [phone] 5 1 2 2   [facility] [address], [place] [phone] 1 1 3 1   [address] ([facility]) [address] [place] [phone] 2 2 2 2   [facility] [address] [place], [place] [phone] 2 2 1 1   [facility] [address], [place] [phone] 3 1 1 1   Eligha Bridegroom [address] Liliane Shi [phone] 3 3 4 4   [facility] [address], [place] [phone] 3 4 3 3           [facility] [place] [facility] [address] [place] [phone] 4 2 1 1   [facility] [address] [place] [phone] 2 2 2 2   [facility] [place] [address], [place] [phone] 2 1 4 3   [address], [place] [address], [place] [phone] 1 1 2 1   [facility] [address], [employer] [phone] 2 1 4 3           [address] (no [facility]) [address], [place] [phone] 4 4 5 5   [facility] (No [employer]) [address] [place], [place] [phone] 1 2 4 3   [facility] (No [employer]) [address], [place] [phone] 4 1 5  4  [facility] [address] [employer] [phone] 3 4 2 2   [facility] [address] [place] [phone] 2 1 2 1   [facility] [address] [place] [phone] 2 1 1 1   [facility] [address] [place] [phone] 4 2 5 5   [facility]  [address] [place] [phone] 2 2 3 3   [facility] [address] [place] [phone] 4 1 1 1   [facility] [place] [address] [place], [place] [phone] 2 2 2 2           [facility] [address], [place] [phone] 2 1 1 1   Renel[facility] Mom [address] [place]lyn Clines  [phone] 3 3 3 3   [facility] [place]  [address], [place] [phone] 4 3 5 5   [facility] [place] [address], [place] [phone] 2 1 1 1   [facility] (No Humana) [address] [place] [phone] 2 2 4 4   [facility] [address], [place] [phone] 2 1 2 1           [facility] [address] [place], [place] [phone] 5 4 5 5   [facility])  [address] [place] [phone] 1 1 2 1   [facility]) [address], [place] [phone]  2 4 4   [facility] [address] [place] [phone] 3 5 5 5   [address] [place], [place] [phone] 4 2 2 2   [facility]) [address] [place] [phone] 2 1 3 2      Expected Discharge Plan: Home w Home Health Services Barriers to Discharge: Continued Medical Work up, Insurance Authorization   Patient Goals and CMS Choice Patient states their goals for this hospitalization and ongoing recovery are:: Return home CMS Medicare.gov Compare Post Acute Care list provided to:: Patient Represenative (must comment) Choice offered to / list presented to : Adult Children [place] ownership interest in [facility].provided to:: Adult Children    Expected Discharge Plan and Services In-house Referral: Clinical Social Work   Post Acute Care Choice: Home Health, Skilled Nursing Facility Living arrangements for the past 2 months: Single Family Home                                      Prior Living Arrangements/Services Living arrangements for the past 2 months: Single Family Home Lives with:: Adult Children Patient language and need for interpreter reviewed:: Yes Do you feel safe going back to the place where you live?: Yes      Need for Family Participation in Patient Care: Yes (Comment) Care giver support system in place?: Yes (comment) Current home services: DME (walker, cane) Criminal Activity/Legal Involvement  Pertinent to Current Situation/Hospitalization: No - Comment as needed  Activities of Daily Living      Permission Sought/Granted Permission sought to share information with : Facility Medical sales representative, Family Supports Permission granted to share information with : No  Share Information with NAME: Sonnie Alamo  Permission granted to share info w AGENCY: SNFs  Permission granted to share info w Relationship: Daughter  Permission granted to share info w Contact Information: [phone]  Emotional Assessment Appearance:: Appears stated age Attitude/Demeanor/Rapport: Unable to Assess Affect (typically observed): Unable to Assess Orientation: : Oriented to Self Alcohol / Substance Use: Not Applicable Psych Involvement: No (comment)  Admission diagnosis:  Acute cystitis  without hematuria [N30.00] AKI (acute kidney injury) (HCC) [N17.9] Diabetic ketoacidosis without coma associated with type 2 diabetes mellitus (HCC) [E11.10] 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 (HCC) [E11.10] Patient Active Problem List   Diagnosis Date Noted   Malnutrition of moderate degree 01/20/2024   Hyperlipidemia 01/17/2024   Leukocytosis 01/17/2024   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 (HCC) 01/17/2024   Acute encephalopathy 01/17/2024   fracture of distal diaphyseal metaphyseal junction of the 5th left metacarpal with dorsal angulation 01/17/2024   Cholecystostomy care (HCC) 01/17/2024   Migration of percutaneous cholecystostomy tube 08/18/2023   Hypertension associated with diabetes (HCC) 08/18/2023   History of CVA (cerebrovascular accident) 08/18/2023   TIA (transient ischemic attack) 10/15/2022   Odynophagia 08/21/2022   Hyponatremia 08/20/2022   Obesity (BMI 30-39.9) 08/20/2022   Myocardial injury 08/20/2022   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 [place] [address] Phone: [phone] Fax: [phone]  [facility] [address] [place] [address] Phone: [phone] Fax: [phone]  [facility] [place], [place] - [address] [facility] [address] [place] [address] Phone: [phone] Fax: [phone]  [id number] [place], [place] - [address] [facility] [address] [place] [address] Phone: [phone] Fax: [phone]     Social Drivers of Health (SDOH) Social History: SDOH Screenings   Food Insecurity: Patient Unable To Answer (01/19/2024)  Housing: Patient Unable To Answer (01/19/2024)  Transportation Needs: Patient Unable To Answer (01/19/2024)  Utilities: Patient Unable To Answer (01/19/2024)  Alcohol Screen: Low Risk  (07/24/2023)  Depression (PHQ2-9): Low Risk  (07/24/2023)  Financial Resource Strain: Low Risk  (07/24/2023)  Physical Activity: Inactive (07/24/2023)  Social Connections: Patient Unable To Answer (01/19/2024)  Stress: No Stress Concern Present (07/24/2023)  Tobacco Use: Medium Risk (01/17/2024)  Health Literacy: Adequate Health Literacy (07/24/2023)   SDOH Interventions:     Readmission Risk Interventions    08/15/2022    9:25 AM  Readmission Risk Prevention Plan  Transportation Screening Complete  PCP or Specialist Appt within 5-7 Days Complete  Home Care Screening Complete  Medication Review (RN CM) Complete

## 2024-01-20 NOTE — Plan of Care (Signed)
  Problem: Education: Goal: Ability to describe self-care measures that may prevent or decrease complications (Diabetes Survival Skills Education) will improve Outcome: Not Progressing   Problem: Nutritional: Goal: Maintenance of adequate nutrition will improve Outcome: Not Progressing

## 2024-01-20 NOTE — Progress Notes (Addendum)
PROGRESS NOTE        PATIENT DETAILS Name: Andre Stout Age: 84 y.o. Sex: male Date of Birth: 21-Apr-1940 Admit Date: 01/17/2024 Admitting Physician Andre Mustard, MD WGN:[id number], Andre Gee, FNP  Brief Summary: Patient is a 84 y.o.  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  Significant events: 1/17>> admit to [facility]  Significant studies: 1/17>> x-ray left hand: Fracture at the distal diaphyseal/metaphyseal junction of fifth metacarpal 1/17>> CXR: No PNA 1/17>> CT head: No acute finding 1/17>> CT C-spine: No acute finding. 1/19>> echo: EF 65-70%  Significant microbiology data: 1/17>> blood culture 1/2: Group B strep, staph epidermidis 1/18>> blood culture: Pending 1/18>> urine culture: Klebsiella pneumoniae  Procedures: 1/20>> cholecystostomy tube exchange by IR  Consults: Infectious disease  Subjective: No complaints-lying comfortably in bed.  Denies any nausea, vomiting or diarrhea.  Objective: Vitals: Blood pressure (!) 98/56, pulse 84, temperature (!) 97.3 F (36.3 C), temperature source Oral, resp. rate 17, height 5\' 5"  (1.651 m), weight 74.8 kg, SpO2 96%.   Exam: Gen Exam:Alert awake-not in any distress HEENT:atraumatic, normocephalic Chest: B/L clear to auscultation anteriorly CVS:S1S2 regular Abdomen:soft non tender, non distended Extremities:no edema Neurology: Non focal Skin: no rash  Pertinent Labs/Radiology:    Latest Ref Rng & Units 01/20/2024    3:35 AM 01/19/2024    5:10 AM 01/18/2024    5:36 AM  CBC  WBC 4.0 - 10.5 K/uL 16.5  20.7  19.9   Hemoglobin 13.0 - 17.0 g/dL 86.5  78.4  69.6   Hematocrit 39.0 - 52.0 % 35.8  34.5  36.6   Platelets 150 - 400 K/uL 517  511  519     Lab Results  Component Value  Date   NA 138 01/20/2024   K 3.9 01/20/2024   CL 98 01/20/2024   CO2 26 01/20/2024     Assessment/Plan: DKA Resolved with IV insulin/IVF Likely triggered by possible bacteremia  DM-2 with uncontrolled hyperglycemia/DKA (A1c 10.07 November 2023) CBGs remains on the higher side Increase Semglee to 22 units-add 4 units of NovoLog with meals Continue SSI Follow/optimize  Recent Labs    01/20/24 0354 01/20/24 0845 01/20/24 1148  GLUCAP 234* 165* 240*   Acute metabolic encephalopathy Likely due to DKA/bacteremia Overall better-much more awake and alert-suspect back to baseline. CT imaging negative Maintain delirium precautions  Streptococcal bacteremia Afebrile-leukocytosis downtrending Evaluated by ID-now on penicillin G Repeat cultures negative-transthoracic echo without any obvious vegetation.    Asymptomatic bacteriuria No symptoms of UTI-nevertheless-already on antibiotic that should cover.  History of cholecystitis-s/p cholecystostomy tube in place IR performed cholecystostomy tube exchange 1/20  History of intermittent complete heart block Telemetry monitoring Avoid AV nodal blocking agents Per prior notes-is being monitored closely by EP/cardiology  History of CVA Appears to be moving all 4 extremities Aspirin/Plavix/statin  HTN BP stable Continue amlodipine  Hypokalemia Replete/recheck  AKI Likely hemodynamically mediated Resolved with supportive care  Hypomagnesemia Replete/recheck  Fracture of distal middle Faisal diaphyseal junction-left fifth metacarpal Per prior notes-Ortho consulted-ulnar splint-Dr. Yehuda Budd recommended follow-up in 1 week.  Nutrition Status: Nutrition Problem: Moderate Malnutrition Etiology: acute illness Signs/Symptoms: mild fat depletion, mild muscle depletion, energy intake < or equal to 50% for > or equal  to 5 days Interventions: Ensure Enlive (each supplement provides 350kcal and 20 grams of protein), Hormel Shake,  MVI, Liberalize Diet    BMI: Estimated body mass index is 27.46 kg/m as calculated from the following:   Height as of this encounter: 5\' 5"  (1.651 m).   Weight as of this encounter: 74.8 kg.   Code status:   Code Status: Full Code   DVT Prophylaxis: enoxaparin (LOVENOX) injection 40 mg Start: 01/18/24 1830   Family Communication: Daughter Andre Stout-[phone] updated 1/19   Disposition Plan: Status is: Inpatient Remains inpatient appropriate because: Severity of illness   Planned Discharge Destination:Home health   Diet: Diet Order             Diet heart healthy/carb modified Room service appropriate? Yes; Fluid consistency: Thin  Diet effective now                     Antimicrobial agents: Anti-infectives (From admission, onward)    Start     Dose/Rate Route Frequency Ordered Stop   01/19/24 1445  penicillin G potassium 12 Million Units in sodium chloride 0.9 % 500 mL CONTINUOUS infusion        12 Million Units 41.7 mL/hr over 12 Hours Intravenous Every 12 hours 01/19/24 1348     01/17/24 1745  cefTRIAXone (ROCEPHIN) 2 g in sodium chloride 0.9 % 100 mL IVPB  Status:  Discontinued        2 g 200 mL/hr over 30 Minutes Intravenous Every 24 hours 01/17/24 1738 01/19/24 1303        MEDICATIONS: Scheduled Meds:  amLODipine  10 mg Oral Daily   aspirin  81 mg Oral Daily   atorvastatin  40 mg Oral Daily   brimonidine  1 drop Both Eyes BID WC   clopidogrel  75 mg Oral Daily   dorzolamide-timolol  1 drop Both Eyes BID WC   enoxaparin (LOVENOX) injection  40 mg Subcutaneous Q24H   feeding supplement  237 mL Oral BID BM   insulin aspart  0-15 Units Subcutaneous Q4H   insulin glargine-yfgn  15 Units Subcutaneous Daily   latanoprost  1 drop Both Eyes QHS   pantoprazole  40 mg Oral BID AC   sodium chloride flush  5 mL Intracatheter Q8H   Continuous Infusions:  magnesium sulfate bolus IVPB 4 g (01/20/24 1109)   penicillin G potassium 12 Million Units in  sodium chloride 0.9 % 500 mL CONTINUOUS infusion 12 Million Units (01/20/24 0509)   PRN Meds:.acetaminophen **OR** acetaminophen, dextrose, iohexol, ondansetron **OR** ondansetron (ZOFRAN) IV   I have personally reviewed following labs and imaging studies  LABORATORY DATA: CBC: Recent Labs  Lab 01/17/24 1342 01/17/24 1353 01/17/24 1354 01/18/24 0536 01/19/24 0510 01/20/24 0335  WBC 19.3*  --   --  19.9* 20.7* 16.5*  NEUTROABS  --   --   --   --   --  13.2*  HGB 13.3 14.3 14.6 12.0* 11.3* 11.6*  HCT 40.8 42.0 43.0 36.6* 34.5* 35.8*  MCV 80.5  --   --  79.4* 78.4* 80.1  PLT 571*  --   --  519* 511* 517*    Basic Metabolic Panel: Recent Labs  Lab 01/17/24 1342 01/17/24 1353 01/18/24 0221 01/18/24 0536 01/18/24 0745 01/19/24 0510 01/20/24 0335  NA 128*   < > 134* 133* 136 139 138  K 5.0   < > 3.9 3.7 3.5 3.4* 3.9  CL 89*   < > 99 98 101  103 98  CO2 20*   < > 22 21* 24 25 26   GLUCOSE 424*   < > 186* 270* 140* 167* 277*  BUN 52*   < > 43* 40* 38* 21 15  CREATININE 2.08*   < > 1.55* 1.52* 1.50* 1.16 1.03  CALCIUM 10.1   < > 9.5 9.5 9.7 9.4 9.1  MG 1.9  --   --   --   --   --  1.3*  PHOS 3.1  --   --   --   --   --   --    < > = values in this interval not displayed.    GFR: Estimated Creatinine Clearance: 51.3 mL/min (by C-G formula based on SCr of 1.03 mg/dL).  Liver Function Tests: Recent Labs  Lab 01/17/24 1342  AST 18  ALT 13  ALKPHOS 106  BILITOT 2.1*  PROT 7.5  ALBUMIN 2.8*   Recent Labs  Lab 01/17/24 1342  LIPASE 22   Recent Labs  Lab 01/17/24 1810  AMMONIA 19    Coagulation Profile: No results for input(s): "INR", "PROTIME" in the last 168 hours.  Cardiac Enzymes: No results for input(s): "CKTOTAL", "CKMB", "CKMBINDEX", "TROPONINI" in the last 168 hours.  BNP (last 3 results) No results for input(s): "PROBNP" in the last 8760 hours.  Lipid Profile: No results for input(s): "CHOL", "HDL", "LDLCALC", "TRIG", "CHOLHDL", "LDLDIRECT" in  the last 72 hours.  Thyroid Function Tests: Recent Labs    01/17/24 1810  TSH 2.023    Anemia Panel: Recent Labs    01/17/24 1810  VITAMINB12 331    Urine analysis:    Component Value Date/Time   COLORURINE YELLOW 01/17/2024 1647   APPEARANCEUR CLOUDY (A) 01/17/2024 1647   LABSPEC 1.016 01/17/2024 1647   PHURINE 5.0 01/17/2024 1647   GLUCOSEU >=500 (A) 01/17/2024 1647   HGBUR LARGE (A) 01/17/2024 1647   BILIRUBINUR NEGATIVE 01/17/2024 1647   KETONESUR 5 (A) 01/17/2024 1647   PROTEINUR 30 (A) 01/17/2024 1647   UROBILINOGEN 1.0 10/04/2009 1109   NITRITE NEGATIVE 01/17/2024 1647   LEUKOCYTESUR MODERATE (A) 01/17/2024 1647    Sepsis Labs: Lactic Acid, Venous    Component Value Date/Time   LATICACIDVEN 3.85 (HH) 03/04/2016 1417    MICROBIOLOGY: Recent Results (from the past 240 hours)  Culture, blood (Routine X 2) w Reflex to ID Panel     Status: Abnormal   Collection Time: 01/17/24  5:44 PM   Specimen: BLOOD RIGHT ARM  Result Value Ref Range Status   Specimen Description BLOOD RIGHT ARM  Final   Special Requests   Final    BOTTLES DRAWN AEROBIC AND ANAEROBIC Blood Culture results may not be optimal due to an inadequate volume of blood received in culture bottles   Culture  Setup Time   Final    GRAM POSITIVE COCCI IN CHAINS IN BOTH AEROBIC AND ANAEROBIC BOTTLES CRITICAL RESULT CALLED TO, READ BACK BY AND VERIFIED WITH: PHARMD J. MILLEN [id number] AT 1055, ADC    Culture (A)  Final    GROUP B STREP(S.AGALACTIAE)ISOLATED STAPHYLOCOCCUS EPIDERMIDIS THE SIGNIFICANCE OF ISOLATING THIS ORGANISM FROM A SINGLE SET OF BLOOD CULTURES WHEN MULTIPLE SETS ARE DRAWN IS UNCERTAIN. PLEASE NOTIFY THE MICROBIOLOGY DEPARTMENT WITHIN ONE WEEK IF SPECIATION AND SENSITIVITIES ARE REQUIRED. Performed at [facility], [address] [place], [place] [address]    Report Status 01/20/2024 FINAL  Final   Organism ID, Bacteria GROUP B STREP(S.AGALACTIAE)ISOLATED  Final  Susceptibility   Group b strep(s.agalactiae)isolated - MIC*    CLINDAMYCIN >=1 RESISTANT Resistant     AMPICILLIN <=0.25 SENSITIVE Sensitive     ERYTHROMYCIN >=8 RESISTANT Resistant     VANCOMYCIN 0.5 SENSITIVE Sensitive     CEFTRIAXONE <=0.12 SENSITIVE Sensitive     LEVOFLOXACIN 0.5 SENSITIVE Sensitive     PENICILLIN <=0.06 SENSITIVE Sensitive     * GROUP B STREP(S.AGALACTIAE)ISOLATED  Blood Culture ID Panel (Reflexed)     Status: Abnormal   Collection Time: 01/17/24  5:44 PM  Result Value Ref Range Status   Enterococcus faecalis NOT DETECTED NOT DETECTED Final   Enterococcus Faecium NOT DETECTED NOT DETECTED Final   Listeria monocytogenes NOT DETECTED NOT DETECTED Final   Staphylococcus species NOT DETECTED NOT DETECTED Final   Staphylococcus aureus (BCID) NOT DETECTED NOT DETECTED Final   Staphylococcus epidermidis NOT DETECTED NOT DETECTED Final   Staphylococcus lugdunensis NOT DETECTED NOT DETECTED Final   Streptococcus species DETECTED (A) NOT DETECTED Final    Comment: CRITICAL RESULT CALLED TO, READ BACK BY AND VERIFIED WITH: PHARMD J. MILLEN [id number] AT 1055, ADC    Streptococcus agalactiae DETECTED (A) NOT DETECTED Final    Comment: CRITICAL RESULT CALLED TO, READ BACK BY AND VERIFIED WITH: PHARMD J. MILLEN [id number] AT 1055, ADC    Streptococcus pneumoniae NOT DETECTED NOT DETECTED Final   Streptococcus pyogenes NOT DETECTED NOT DETECTED Final   A.calcoaceticus-baumannii NOT DETECTED NOT DETECTED Final   Bacteroides fragilis NOT DETECTED NOT DETECTED Final   Enterobacterales NOT DETECTED NOT DETECTED Final   Enterobacter cloacae complex NOT DETECTED NOT DETECTED Final   Escherichia coli NOT DETECTED NOT DETECTED Final   Klebsiella aerogenes NOT DETECTED NOT DETECTED Final   Klebsiella oxytoca NOT DETECTED NOT DETECTED Final   Klebsiella pneumoniae NOT DETECTED NOT DETECTED Final   Proteus species NOT DETECTED NOT DETECTED Final   Salmonella species NOT DETECTED NOT  DETECTED Final   Serratia marcescens NOT DETECTED NOT DETECTED Final   Haemophilus influenzae NOT DETECTED NOT DETECTED Final   Neisseria meningitidis NOT DETECTED NOT DETECTED Final   Pseudomonas aeruginosa NOT DETECTED NOT DETECTED Final   Stenotrophomonas maltophilia NOT DETECTED NOT DETECTED Final   Candida albicans NOT DETECTED NOT DETECTED Final   Candida auris NOT DETECTED NOT DETECTED Final   Candida glabrata NOT DETECTED NOT DETECTED Final   Candida krusei NOT DETECTED NOT DETECTED Final   Candida parapsilosis NOT DETECTED NOT DETECTED Final   Candida tropicalis NOT DETECTED NOT DETECTED Final   Cryptococcus neoformans/gattii NOT DETECTED NOT DETECTED Final    Comment: Performed at [facility], [address], [place], [place] [address]  Culture, blood (Routine X 2) w Reflex to ID Panel     Status: None (Preliminary result)   Collection Time: 01/17/24  6:22 PM   Specimen: BLOOD RIGHT HAND  Result Value Ref Range Status   Specimen Description BLOOD RIGHT HAND  Final   Special Requests   Final    BOTTLES DRAWN AEROBIC AND ANAEROBIC Blood Culture results may not be optimal due to an inadequate volume of blood received in culture bottles   Culture   Final    NO GROWTH 3 DAYS Performed at [facility], [address], [place], [place] [address]    Report Status PENDING  Incomplete  Urine Culture (for pregnant, neutropenic or urologic patients or patients with an indwelling urinary catheter)     Status: Abnormal   Collection Time: 01/18/24  8:41  AM   Specimen: Urine, Clean Catch  Result Value Ref Range Status   Specimen Description URINE, CLEAN CATCH  Final   Special Requests   Final    NONE Performed at [facility], [address], [place], [place] [address]    Culture >=100,000 COLONIES/mL KLEBSIELLA PNEUMONIAE (A)  Final   Report Status 01/20/2024 FINAL  Final   Organism ID, Bacteria KLEBSIELLA PNEUMONIAE (A)  Final      Susceptibility   Klebsiella  pneumoniae - MIC*    AMPICILLIN RESISTANT Resistant     CEFAZOLIN <=4 SENSITIVE Sensitive     CEFEPIME <=0.12 SENSITIVE Sensitive     CEFTRIAXONE <=0.25 SENSITIVE Sensitive     CIPROFLOXACIN <=0.25 SENSITIVE Sensitive     GENTAMICIN <=1 SENSITIVE Sensitive     IMIPENEM <=0.25 SENSITIVE Sensitive     NITROFURANTOIN 64 INTERMEDIATE Intermediate     TRIMETH/SULFA <=20 SENSITIVE Sensitive     AMPICILLIN/SULBACTAM 4 SENSITIVE Sensitive     PIP/TAZO <=4 SENSITIVE Sensitive ug/mL    * >=100,000 COLONIES/mL KLEBSIELLA PNEUMONIAE  Culture, blood (Routine X 2) w Reflex to ID Panel     Status: None (Preliminary result)   Collection Time: 01/18/24  3:43 PM   Specimen: BLOOD LEFT ARM  Result Value Ref Range Status   Specimen Description BLOOD LEFT ARM  Final   Special Requests   Final    BOTTLES DRAWN AEROBIC AND ANAEROBIC Blood Culture results may not be optimal due to an inadequate volume of blood received in culture bottles   Culture   Final    NO GROWTH 2 DAYS Performed at [facility], [address], [place], [place] [address]    Report Status PENDING  Incomplete  Culture, blood (Routine X 2) w Reflex to ID Panel     Status: None (Preliminary result)   Collection Time: 01/18/24  3:46 PM   Specimen: BLOOD LEFT HAND  Result Value Ref Range Status   Specimen Description BLOOD LEFT HAND  Final   Special Requests   Final    BOTTLES DRAWN AEROBIC AND ANAEROBIC Blood Culture results may not be optimal due to an inadequate volume of blood received in culture bottles   Culture   Final    NO GROWTH 2 DAYS Performed at [facility], [address] [place], [place] [address]    Report Status PENDING  Incomplete    RADIOLOGY STUDIES/RESULTS: IR EXCHANGE BILIARY DRAIN Result Date: 01/20/2024 INDICATION: 84 year old male with history of chronic indwelling percutaneous cholecystostomy tube, currently admitted with bacteremia. Request for cholecystostomy tube exchange. EXAM:  FLUOROSCOPIC GUIDED CHOLECYSTOSTOMY TUBE EXCHANGE COMPARISON:  12/19/2023 MEDICATIONS: None ANESTHESIA/SEDATION: None CONTRAST:  10mL OMNIPAQUE IOHEXOL 300 MG/ML SOLN - administered into the gallbladder lumen. FLUOROSCOPY TIME:  Nine mGy COMPLICATIONS: None immediate. PROCEDURE: The patient was positioned supine on the fluoroscopy table. The external portion of the existing cholecystostomy tube as well as the surrounding skin was prepped and draped in usual sterile fashion. A time-out was performed prior to the initiation of the procedure. A preprocedural spot fluoroscopic image was obtained of the right upper abdominal quadrant existing cholecystostomy tube. The skin surrounding the cholecystostomy tube was anesthetized with 1% lidocaine with epinephrine. The external portion of the cholecystostomy tube was cut and cannulated with a short Amplatz wire which was advanced through the tube and coiled within the gallbladder lumen Next, under intermittent fluoroscopic guidance, the existing 12 French cholecystostomy tube was exchanged for a new 12 [place] cholecystostomy  tube which was repositioned into the more central aspect of the gallbladder lumen. Contrast injection confirms appropriate positioning and functionality of the cholecystomy tube. The cholecystostomy tube was flushed with a small amount of saline and reconnected to a gravity bag. The cholecystostomy tube was secured with an interrupted suture and a Stat Lock device. A dressing was applied. The patient tolerated the procedure well without immediate postprocedural complication. FINDINGS: Preprocedural spot fluoroscopic image demonstrates unchanged positioning of cholecystostomy tube with end coiled and locked over the expected location of the fundus of the gallbladder After fluoroscopic guided exchange, the new 12 French cholecystostomy tube is more ideally positioned with end coiled and locked within the central aspect of the gallbladder lumen. The cystic  duct is patent. Post exchange cholangiogram demonstrates appropriate positioning and functionality of the new cholecystostomy tube. The cystic duct is patent. IMPRESSION: Successful fluoroscopic guided exchange of 12 French cholecystostomy tube. Patent appearing cystic duct. PLAN: Routine 8 week cholecystostomy tube check and exchange. Marliss Coots, MD [facility] Electronically Signed   By: Marliss Coots M.D.   On: 01/20/2024 10:30   ECHOCARDIOGRAM COMPLETE Result Date: 01/19/2024    ECHOCARDIOGRAM REPORT   Patient Name:   COYT ARNN Date of Exam: 01/19/2024 Medical Rec #:  [id number]      Height:       65.0 in Accession #:    [id number]     Weight:       165.0 lb Date of Birth:  04-Feb-1940     BSA:          1.823 m Patient Age:    70 years       BP:           124/70 mmHg Patient Gender: M              HR:           83 bpm. Exam Location:  Inpatient Procedure: 2D Echo, Cardiac Doppler, Color Doppler and Intracardiac            Opacification Agent Indications:    Bacteremia R78.81  History:        Patient has prior history of Echocardiogram examinations, most                 recent 08/16/2022. Stroke; Risk Factors:Hypertension, Diabetes                 and Dyslipidemia.  Sonographer:    Celesta Gentile RCS Referring Phys: [id number] [facility]  Sonographer Comments: Technically difficult study due to poor echo windows. IMPRESSIONS  1. Left ventricular ejection fraction, by estimation, is 65 to 70%. The left ventricle has normal function. The left ventricle has no regional wall motion abnormalities. There is mild left ventricular hypertrophy. Indeterminate diastolic filling due to E-A fusion.  2. Right ventricular systolic function is hyperdynamic. The right ventricular size is normal.  3. Left atrial size was moderately dilated.  4. The mitral valve is normal in structure. Trivial mitral valve regurgitation. No evidence of mitral stenosis.  5. The aortic  valve is tricuspid. There is mild thickening of the aortic valve. Aortic valve regurgitation is not visualized. Aortic valve sclerosis is present, with no evidence of aortic valve stenosis.  6. The inferior vena cava is normal in size with greater than 50% respiratory variability, suggesting right atrial pressure of 3 mmHg. Comparison(s): No significant change from prior study. Prior images reviewed side by side. Conclusion(s)/Recommendation(s): No evidence  of valvular vegetations on this transthoracic echocardiogram. Consider a transesophageal echocardiogram to exclude infective endocarditis if clinically indicated. FINDINGS  Left Ventricle: Left ventricular ejection fraction, by estimation, is 65 to 70%. The left ventricle has normal function. The left ventricle has no regional wall motion abnormalities. Definity contrast agent was given IV to delineate the left ventricular  endocardial borders. The left ventricular internal cavity size was normal in size. There is mild left ventricular hypertrophy. Indeterminate diastolic filling due to E-A fusion. Right Ventricle: The right ventricular size is normal. No increase in right ventricular wall thickness. Right ventricular systolic function is hyperdynamic. Left Atrium: Left atrial size was moderately dilated. Right Atrium: Right atrial size was normal in size. Pericardium: There is no evidence of pericardial effusion. Mitral Valve: The mitral valve is normal in structure. Trivial mitral valve regurgitation. No evidence of mitral valve stenosis. Tricuspid Valve: The tricuspid valve is normal in structure. Tricuspid valve regurgitation is not demonstrated. No evidence of tricuspid stenosis. Aortic Valve: The aortic valve is tricuspid. There is mild thickening of the aortic valve. Aortic valve regurgitation is not visualized. Aortic valve sclerosis is present, with no evidence of aortic valve stenosis. Pulmonic Valve: The pulmonic valve was normal in structure. Pulmonic  valve regurgitation is not visualized. No evidence of pulmonic stenosis. Aorta: The aortic root is normal in size and structure. Venous: The inferior vena cava is normal in size with greater than 50% respiratory variability, suggesting right atrial pressure of 3 mmHg. IAS/Shunts: No atrial level shunt detected by color flow Doppler.  LEFT VENTRICLE PLAX 2D LVIDd:         3.60 cm LVIDs:         1.90 cm LV PW:         1.10 cm LV IVS:        1.20 cm LVOT diam:     1.80 cm LV SV:         48 LV SV Index:   26 LVOT Area:     2.54 cm  RIGHT VENTRICLE TAPSE (M-mode): 2.4 cm LEFT ATRIUM             Index        RIGHT ATRIUM           Index LA diam:        3.40 cm 1.87 cm/m   RA Area:     20.40 cm LA Vol (A2C):   54.6 ml 29.95 ml/m  RA Volume:   62.80 ml  34.45 ml/m LA Vol (A4C):   88.6 ml 48.61 ml/m LA Biplane Vol: 74.8 ml 41.03 ml/m  AORTIC VALVE LVOT Vmax:   101.00 cm/s LVOT Vmean:  64.700 cm/s LVOT VTI:    0.188 m  AORTA Ao Root diam: 3.40 cm MITRAL VALVE MV Area (PHT): 6.11 cm     SHUNTS MV E velocity: 128.00 cm/s  Systemic VTI:  0.19 m                             Systemic Diam: 1.80 cm Rachelle Hora Croitoru MD Electronically signed by Thurmon Fair MD Signature Date/Time: 01/19/2024/2:49:23 PM    Final      LOS: 3 days   Jeoffrey Massed, MD  [facility]    To contact the attending provider between 7A-7P or the covering provider during after hours 7P-7A, please log into the web site [email or web address] and access using universal Powers password for that web site.  If you do not have the password, please call the hospital operator.  01/20/2024, 11:51 AM

## 2024-01-20 NOTE — Inpatient Diabetes Management (Signed)
Inpatient Diabetes Program Recommendations  AACE/ADA: New Consensus Statement on Inpatient Glycemic Control (2015)  Target Ranges:  Prepandial:   less than 140 mg/dL      Peak postprandial:   less than 180 mg/dL (1-2 hours)      Critically ill patients:  140 - 180 mg/dL   Lab Results  Component Value Date   GLUCAP 165 (H) 01/20/2024   HGBA1C 10.7 (A) 11/14/2023    Latest Reference Range & Units 01/19/24 04:00 01/19/24 12:44 01/19/24 17:17 01/19/24 17:23 01/19/24 19:56 01/20/24 00:19 01/20/24 03:54 01/20/24 08:45  Glucose-Capillary 70 - 99 mg/dL 161 (H) Novolog 2 units 256 (H) Novolog 8 units 212 (H) 233 (H) 235 (H) Novolog 5 units 264 (H) Novolog 8 units 234 (H) 165 (H)  (H): Data is abnormally high  Diabetes history: DM2 Outpatient Diabetes medications: 70/30 20 AM, 15 PM; Prednisone 20 QAM, FSL3  Current orders for Inpatient glycemic control: Semglee 15 units daily, Novolog 0-15 units q 4 hrs. correction  Inpatient Diabetes Program Recommendations:   Patient has received total of Novolog 26 units correction over the past 24 hrs. Please consider: -Increase Semglee to 20 units daily -Change Novolog correction to 0-15 units tid, 0-5 units hs -Add Novolog 5 units tid meal coverage  Thank you, Darel Hong E. Meridian Scherger, RN, MSN, CDCES  Diabetes Coordinator Inpatient Glycemic Control Team Team Pager [phone] (8am-5pm) 01/20/2024 10:10 AM

## 2024-01-20 NOTE — Procedures (Signed)
Interventional Radiology Procedure Note  Procedure: Cholecystostomy tube exchange  Findings: Please refer to procedural dictation for full description. 12 Fr Skater, to bag drainage.  Complications: None immediate  Estimated Blood Loss: < 5 mL  Recommendations: Keep to bag drainage. Follow up in IR in 8 weeks for routine check/change.   Marliss Coots, MD

## 2024-01-20 NOTE — Progress Notes (Signed)
[facility]   Reason for visit: Follow up on group B strep bacteremia  Interval History: WBC down to 16.5, he remains afebrile.  Repeat blood cultures remain no growth to date.   Physical Exam: Constitutional:  Vitals:   01/20/24 1149 01/20/24 1539  BP: (!) 98/56 (!) 101/50  Pulse: 84 69  Resp: 17 20  Temp: (!) 97.3 F (36.3 C) 98 F (36.7 C)  SpO2: 96%    patient appears in NAD Respiratory: Normal respiratory effort  Review of Systems: Constitutional: negative for fevers and chills  Lab Results  Component Value Date   WBC 16.5 (H) 01/20/2024   HGB 11.6 (L) 01/20/2024   HCT 35.8 (L) 01/20/2024   MCV 80.1 01/20/2024   PLT 517 (H) 01/20/2024    Lab Results  Component Value Date   CREATININE 1.03 01/20/2024   BUN 15 01/20/2024   NA 138 01/20/2024   K 3.9 01/20/2024   CL 98 01/20/2024   CO2 26 01/20/2024    Lab Results  Component Value Date   ALT 13 01/17/2024   AST 18 01/17/2024   ALKPHOS 106 01/17/2024     Microbiology: Recent Results (from the past 240 hours)  Culture, blood (Routine X 2) w Reflex to ID Panel     Status: Abnormal   Collection Time: 01/17/24  5:44 PM   Specimen: BLOOD RIGHT ARM  Result Value Ref Range Status   Specimen Description BLOOD RIGHT ARM  Final   Special Requests   Final    BOTTLES DRAWN AEROBIC AND ANAEROBIC Blood Culture results may not be optimal due to an inadequate volume of blood received in culture bottles   Culture  Setup Time   Final    GRAM POSITIVE COCCI IN CHAINS IN BOTH AEROBIC AND ANAEROBIC BOTTLES CRITICAL RESULT CALLED TO, READ BACK BY AND VERIFIED WITH: PHARMD J. MILLEN [id number] AT 1055, ADC    Culture (A)  Final    GROUP B STREP(S.AGALACTIAE)ISOLATED STAPHYLOCOCCUS EPIDERMIDIS THE SIGNIFICANCE OF ISOLATING THIS ORGANISM FROM A SINGLE SET OF BLOOD CULTURES WHEN MULTIPLE SETS ARE DRAWN IS UNCERTAIN. PLEASE NOTIFY THE MICROBIOLOGY DEPARTMENT WITHIN ONE WEEK IF SPECIATION AND  SENSITIVITIES ARE REQUIRED. Performed at [facility], [address] [place], [place] [address]    Report Status 01/20/2024 FINAL  Final   Organism ID, Bacteria GROUP B STREP(S.AGALACTIAE)ISOLATED  Final      Susceptibility   Group b strep(s.agalactiae)isolated - MIC*    CLINDAMYCIN >=1 RESISTANT Resistant     AMPICILLIN <=0.25 SENSITIVE Sensitive     ERYTHROMYCIN >=8 RESISTANT Resistant     VANCOMYCIN 0.5 SENSITIVE Sensitive     CEFTRIAXONE <=0.12 SENSITIVE Sensitive     LEVOFLOXACIN 0.5 SENSITIVE Sensitive     PENICILLIN <=0.06 SENSITIVE Sensitive     * GROUP B STREP(S.AGALACTIAE)ISOLATED  Blood Culture ID Panel (Reflexed)     Status: Abnormal   Collection Time: 01/17/24  5:44 PM  Result Value Ref Range Status   Enterococcus faecalis NOT DETECTED NOT DETECTED Final   Enterococcus Faecium NOT DETECTED NOT DETECTED Final   Listeria monocytogenes NOT DETECTED NOT DETECTED Final   Staphylococcus species NOT DETECTED NOT DETECTED Final   Staphylococcus aureus (BCID) NOT DETECTED NOT DETECTED Final   Staphylococcus epidermidis NOT DETECTED NOT DETECTED Final   Staphylococcus lugdunensis NOT DETECTED NOT DETECTED Final   Streptococcus species DETECTED (A) NOT DETECTED Final    Comment: CRITICAL RESULT CALLED TO, READ BACK BY AND VERIFIED  WITH: PHARMD J. MILLEN [id number] AT 1055, ADC    Streptococcus agalactiae DETECTED (A) NOT DETECTED Final    Comment: CRITICAL RESULT CALLED TO, READ BACK BY AND VERIFIED WITH: PHARMD J. MILLEN [id number] AT 1055, ADC    Streptococcus pneumoniae NOT DETECTED NOT DETECTED Final   Streptococcus pyogenes NOT DETECTED NOT DETECTED Final   A.calcoaceticus-baumannii NOT DETECTED NOT DETECTED Final   Bacteroides fragilis NOT DETECTED NOT DETECTED Final   Enterobacterales NOT DETECTED NOT DETECTED Final   Enterobacter cloacae complex NOT DETECTED NOT DETECTED Final   Escherichia coli NOT DETECTED NOT DETECTED Final   Klebsiella aerogenes NOT DETECTED NOT  DETECTED Final   Klebsiella oxytoca NOT DETECTED NOT DETECTED Final   Klebsiella pneumoniae NOT DETECTED NOT DETECTED Final   Proteus species NOT DETECTED NOT DETECTED Final   Salmonella species NOT DETECTED NOT DETECTED Final   Serratia marcescens NOT DETECTED NOT DETECTED Final   Haemophilus influenzae NOT DETECTED NOT DETECTED Final   Neisseria meningitidis NOT DETECTED NOT DETECTED Final   Pseudomonas aeruginosa NOT DETECTED NOT DETECTED Final   Stenotrophomonas maltophilia NOT DETECTED NOT DETECTED Final   Candida albicans NOT DETECTED NOT DETECTED Final   Candida auris NOT DETECTED NOT DETECTED Final   Candida glabrata NOT DETECTED NOT DETECTED Final   Candida krusei NOT DETECTED NOT DETECTED Final   Candida parapsilosis NOT DETECTED NOT DETECTED Final   Candida tropicalis NOT DETECTED NOT DETECTED Final   Cryptococcus neoformans/gattii NOT DETECTED NOT DETECTED Final    Comment: Performed at [facility], [address], [place], [place] [address]  Culture, blood (Routine X 2) w Reflex to ID Panel     Status: None (Preliminary result)   Collection Time: 01/17/24  6:22 PM   Specimen: BLOOD RIGHT HAND  Result Value Ref Range Status   Specimen Description BLOOD RIGHT HAND  Final   Special Requests   Final    BOTTLES DRAWN AEROBIC AND ANAEROBIC Blood Culture results may not be optimal due to an inadequate volume of blood received in culture bottles   Culture   Final    NO GROWTH 3 DAYS Performed at [facility], [address] [place], [place] [address]    Report Status PENDING  Incomplete  Urine Culture (for pregnant, neutropenic or urologic patients or patients with an indwelling urinary catheter)     Status: Abnormal   Collection Time: 01/18/24  8:41 AM   Specimen: Urine, Clean Catch  Result Value Ref Range Status   Specimen Description URINE, CLEAN CATCH  Final   Special Requests   Final    NONE Performed at [facility], [address]  [place], [place] [address]    Culture >=100,000 COLONIES/mL KLEBSIELLA PNEUMONIAE (A)  Final   Report Status 01/20/2024 FINAL  Final   Organism ID, Bacteria KLEBSIELLA PNEUMONIAE (A)  Final      Susceptibility   Klebsiella pneumoniae - MIC*    AMPICILLIN RESISTANT Resistant     CEFAZOLIN <=4 SENSITIVE Sensitive     CEFEPIME <=0.12 SENSITIVE Sensitive     CEFTRIAXONE <=0.25 SENSITIVE Sensitive     CIPROFLOXACIN <=0.25 SENSITIVE Sensitive     GENTAMICIN <=1 SENSITIVE Sensitive     IMIPENEM <=0.25 SENSITIVE Sensitive     NITROFURANTOIN 64 INTERMEDIATE Intermediate     TRIMETH/SULFA <=20 SENSITIVE Sensitive     AMPICILLIN/SULBACTAM 4 SENSITIVE Sensitive     PIP/TAZO <=4 SENSITIVE Sensitive ug/mL    * >=100,000 COLONIES/mL KLEBSIELLA PNEUMONIAE  Culture, blood (Routine X 2) w Reflex to ID Panel     Status: None (Preliminary result)   Collection Time: 01/18/24  3:43 PM   Specimen: BLOOD LEFT ARM  Result Value Ref Range Status   Specimen Description BLOOD LEFT ARM  Final   Special Requests   Final    BOTTLES DRAWN AEROBIC AND ANAEROBIC Blood Culture results may not be optimal due to an inadequate volume of blood received in culture bottles   Culture   Final    NO GROWTH 2 DAYS Performed at [facility], [address], [place], [place] [address]    Report Status PENDING  Incomplete  Culture, blood (Routine X 2) w Reflex to ID Panel     Status: None (Preliminary result)   Collection Time: 01/18/24  3:46 PM   Specimen: BLOOD LEFT HAND  Result Value Ref Range Status   Specimen Description BLOOD LEFT HAND  Final   Special Requests   Final    BOTTLES DRAWN AEROBIC AND ANAEROBIC Blood Culture results may not be optimal due to an inadequate volume of blood received in culture bottles   Culture   Final    NO GROWTH 2 DAYS Performed at [facility], [address], [place], [place] [address]    Report Status PENDING  Incomplete    Impression/Plan:  1.  Group B strep bacteremia.   TTE without any obvious vegetations.  Repeat cultures remain negative.  Leukocytosis improving.  Overall clinically improved. No sites of pain otherwise noted. Continue with penicillin  2.  Biliary drain.  He is now status post tube exchange.  3.  Acute metabolic encephalopathy.  He seems more back to what would be his presumed baseline.  Will continue to monitor.  4.  Asymptomatic bacteriuria.  No symptoms currently no indication to treat.  On antibiotics regardless.

## 2024-01-20 NOTE — Addendum Note (Signed)
Encounter addended by: Duwaine Maxin, RT on: 01/20/2024 7:47 AM  Actions taken: Imaging Exam ended

## 2024-01-20 NOTE — Progress Notes (Signed)
Initial Nutrition Assessment  DOCUMENTATION CODES:   Non-severe (moderate) malnutrition in context of acute illness/injury  INTERVENTION:  Liberalize diet Ensure Plus High Protein po BID, each supplement provides 350 kcal and 20 grams of protein. Mighty Shake TID with meals, each supplement provides 330 kcals and 9 grams of protein Multivitamin with minerals   NUTRITION DIAGNOSIS:   Moderate Malnutrition related to acute illness as evidenced by mild fat depletion, mild muscle depletion, energy intake < or equal to 50% for > or equal to 5 days.    GOAL:   Patient will meet greater than or equal to 90% of their needs    MONITOR:   PO intake, Supplement acceptance  REASON FOR ASSESSMENT:   Consult Assessment of nutrition requirement/status  ASSESSMENT:  84 y.o. M was brought to from to ED by daughter. With reports of increased confusion from baseline, hyperglycemia, weakness, vomiting and not eating in 4-5 days, although drinking fluid. Daughter reported a fall on Sunday the 12th. Admitted for diabetic ketoacidosis with type 2 diabetes mellitus and acute encephalopathy. PMH: DM2, prstate cancer, HTN. SLP evaluation revealed mild aspiration risk, Regular diet: thin liquids. RN reported that the night RN stated patient was chewing foods but was not swallowing them. RN stated she was going to see if SLP could reevaluate.  Patient appeared to be in good mood. RN in room at time of visit. Patient stated that his appetite has not been good but was unable to explain if it was taste, not hungry. He did say that he feels as though he might be having more difficulty in chewing due to loss of his lower dentures. When asked about meal history he laughed and said he did not like the food at home. He stated he like shakes and would like to have them on his trays.  RN reports he is doing well with his ensures also.  Infection control MD came in room as RD was finishing with Patient.  There is no  benefit to excessive dietary restrictions related advanced age, increased nutrient needs. Patient would benefit better from liberalized diet to help meet increased needs and promote better oral intake.   Significant events: 1/17>> admit to [facility]   Significant studies: 1/17>> x-ray left hand: Fracture at the distal diaphyseal/metaphyseal junction of fifth metacarpal 1/17>> CXR: No PNA 1/17>> CT head: No acute finding 1/17>> CT C-spine: No acute finding.   Significant microbiology data: 1/17>> blood culture 1/2: Gram-positive cocci in chains (BCID-strep agalactiae) 1/18>> blood culture: NG Admit weight: 74.8 kg Weight history:  01/17/24 74.8 kg  12/17/23 76.4 kg  12/09/23 77.1 kg  11/27/23 77.1 kg  11/15/23 77.2 kg  11/14/23 77.4 kg  08/18/23 77.5 kg  07/24/23 78 kg  07/11/23 78.1 kg  01/22/23 75.4 kg     Average Meal Intake: Missing documentation  Nutritionally Relevant Medications: Scheduled Meds:  amLODipine  10 mg Oral Daily   feeding supplement  237 mL Oral BID BM    Continuous Infusions:  magnesium sulfate bolus IVPB 4 g (01/20/24 1109)   penicillin G potassium 12 Million Units in sodium chloride 0.9 % 500 mL CONTINUOUS infusion 12 Million Units (01/20/24 0509)     Labs Reviewed:  CBG ranges from 277-167 mg/dL over the last 24 hours HgbA1c 10.7 11/14/23    NUTRITION - FOCUSED PHYSICAL EXAM:  Flowsheet Row Most Recent Value  Orbital Region Mild depletion  Upper Arm Region Mild depletion  Thoracic and Lumbar Region No depletion  Buccal Region Mild  depletion  Temple Region Moderate depletion  Clavicle Bone Region Mild depletion  Clavicle and Acromion Bone Region Mild depletion  Scapular Bone Region Unable to assess  Dorsal Hand Mild depletion  Patellar Region Mild depletion  Anterior Thigh Region No depletion  Posterior Calf Region No depletion  Edema (RD Assessment) None  Hair Reviewed  Eyes Reviewed  Mouth Reviewed  [missing bottom dentures]  Skin  Reviewed  Nails Reviewed       Diet Order:   Diet Order             Diet regular Room service appropriate? Yes with Assist; Fluid consistency: Thin  Diet effective now                   EDUCATION NEEDS:   Not appropriate for education at this time  Skin:  Skin Assessment: Reviewed RN Assessment  Last BM:  01/17/23  Height:   Ht Readings from Last 1 Encounters:  01/17/24 5\' 5"  (1.651 m)    Weight:   Wt Readings from Last 1 Encounters:  01/17/24 74.8 kg    Ideal Body Weight:     BMI:  Body mass index is 27.46 kg/m.  Estimated Nutritional Needs:   Kcal:  2250-2600 kcal  Protein:  100-120 g  Fluid:  1875-2250 ml    Jamelle Haring RDN, LDN Clinical Dietitian   If unable to reach, please contact "RD Inpatient" secure chat group between 8 am-4 pm daily"

## 2024-01-20 NOTE — NC FL2 (Signed)
[place] MEDICAID FL2 LEVEL OF CARE FORM     IDENTIFICATION  Patient Name: Andre Stout Birthdate: 23-Mar-1940 Sex: male Admission Date (Current Location): 01/17/2024  [facility] and [place] Number:  Producer, television/film/video and Address:  [facility], [address] [place], [place] [address]      Provider Number: [id number]  Attending Physician Name and Address:  Maretta Bees, MD  Relative Name and Phone Number:       Current Level of Care: Hospital Recommended Level of Care: Skilled Nursing Facility Prior Approval Number:    Date Approved/Denied:   PASRR Number: [id number]  Discharge Plan: SNF    Current Diagnoses: Patient Active Problem List   Diagnosis Date Noted   Malnutrition of moderate degree 01/20/2024   Hyperlipidemia 01/17/2024   Leukocytosis 01/17/2024   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 (HCC) 01/17/2024   Acute encephalopathy 01/17/2024   fracture of distal diaphyseal metaphyseal junction of the 5th left metacarpal with dorsal angulation 01/17/2024   Cholecystostomy care (HCC) 01/17/2024   Migration of percutaneous cholecystostomy tube 08/18/2023   Hypertension associated with diabetes (HCC) 08/18/2023   History of CVA (cerebrovascular accident) 08/18/2023   TIA (transient ischemic attack) 10/15/2022   Odynophagia 08/21/2022   Hyponatremia 08/20/2022   Obesity (BMI 30-39.9) 08/20/2022   Myocardial injury 08/20/2022   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   Seizure (HCC) 01/02/2022   Abnormal EKG 01/02/2022   Syncope and collapse 10/09/2020   Prostate cancer (HCC) 03/25/2019   Hypophosphatemia 03/06/2016   Benign essential HTN 03/05/2016   AKI (acute kidney injury) (HCC) 03/04/2016    Orientation RESPIRATION BLADDER Height & Weight     Self  Normal Incontinent (Biliary Tube Cholecystostomy) Weight: 165 lb (74.8 kg) Height:  5\' 5"  (165.1 cm)  BEHAVIORAL SYMPTOMS/MOOD NEUROLOGICAL  BOWEL NUTRITION STATUS      Continent Diet (see dc summary)  AMBULATORY STATUS COMMUNICATION OF NEEDS Skin   Extensive Assist Verbally Normal (Biliary Tube Cholecystostomy)                       Personal Care Assistance Level of Assistance  Bathing, Feeding, Dressing Bathing Assistance: Maximum assistance Feeding assistance: Limited assistance Dressing Assistance: Maximum assistance     Functional Limitations Info             SPECIAL CARE FACTORS FREQUENCY  PT (By licensed PT), OT (By licensed OT)     PT Frequency: 5x/week OT Frequency: 5x/week            Contractures Contractures Info: Not present    Additional Factors Info  Code Status, Allergies Code Status Info: Full Allergies Info: NKA           Current Medications (01/20/2024):  This is the current hospital active medication list Current Facility-Administered Medications  Medication Dose Route Frequency Provider Last Rate Last Admin   acetaminophen (TYLENOL) tablet 650 mg  650 mg Oral Q6H PRN Orland Mustard, MD       Or   acetaminophen (TYLENOL) suppository 650 mg  650 mg Rectal Q6H PRN Orland Mustard, MD       amLODipine (NORVASC) tablet 10 mg  10 mg Oral Daily Arnetha Courser, MD   10 mg at 01/20/24 1049   aspirin chewable tablet 81 mg  81 mg Oral Daily Orland Mustard, MD   81 mg at 01/20/24 1049   atorvastatin (LIPITOR) tablet 40 mg  40 mg Oral Daily Artis Flock,  Revonda Standard, MD   40 mg at 01/20/24 1049   brimonidine (ALPHAGAN) 0.2 % ophthalmic solution 1 drop  1 drop Both Eyes BID WC Orland Mustard, MD   1 drop at 01/20/24 1051   clopidogrel (PLAVIX) tablet 75 mg  75 mg Oral Daily Orland Mustard, MD   75 mg at 01/20/24 1049   dextrose 50 % solution 0-50 mL  0-50 mL Intravenous PRN John Giovanni, MD       dorzolamide-timolol (COSOPT) 2-0.5 % ophthalmic solution 1 drop  1 drop Both Eyes BID WC Orland Mustard, MD   1 drop at 01/20/24 1051   enoxaparin (LOVENOX) injection 40 mg  40 mg Subcutaneous Q24H Arnetha Courser, MD   40 mg at 01/19/24 1703   feeding supplement (ENSURE ENLIVE / ENSURE PLUS) liquid 237 mL  237 mL Oral BID BM Maretta Bees, MD   237 mL at 01/20/24 1050   insulin aspart (novoLOG) injection 0-15 Units  0-15 Units Subcutaneous Q4H Arnetha Courser, MD   8 Units at 01/20/24 0130   insulin aspart (novoLOG) injection 4 Units  4 Units Subcutaneous TID WC Ghimire, Werner Lean, MD       Melene Muller ON 01/21/2024] insulin glargine-yfgn (SEMGLEE) injection 22 Units  22 Units Subcutaneous Daily Ghimire, Werner Lean, MD       iohexol (OMNIPAQUE) 300 MG/ML solution 50 mL  50 mL Per Tube Once PRN Suttle, Thressa Sheller, MD       latanoprost (XALATAN) 0.005 % ophthalmic solution 1 drop  1 drop Both Eyes QHS Orland Mustard, MD   1 drop at 01/19/24 2248   magnesium sulfate IVPB 4 g 100 mL  4 g Intravenous Once Maretta Bees, MD 50 mL/hr at 01/20/24 1109 4 g at 01/20/24 1109   multivitamin with minerals tablet 1 tablet  1 tablet Oral Daily Ghimire, Werner Lean, MD       ondansetron [facility]) tablet 4 mg  4 mg Oral Q6H PRN Orland Mustard, MD       Or   ondansetron [facility]) injection 4 mg  4 mg Intravenous Q6H PRN Orland Mustard, MD       pantoprazole (PROTONIX) EC tablet 40 mg  40 mg Oral BID AC Orland Mustard, MD   40 mg at 01/20/24 1049   penicillin G potassium 12 Million Units in sodium chloride 0.9 % 500 mL CONTINUOUS infusion  12 Million Units Intravenous Q12H Maretta Bees, MD 41.7 mL/hr at 01/20/24 0509 12 Million Units at 01/20/24 0509   sodium chloride flush (NS) 0.9 % injection 5 mL  5 mL Intracatheter Q8H Suttle, Thressa Sheller, MD         Discharge Medications: Please see discharge summary for a list of discharge medications.  Relevant Imaging Results:  Relevant Lab Results:   Additional Information SSN: [id number] [place], [place]

## 2024-01-21 DIAGNOSIS — D72829 Elevated white blood cell count, unspecified: Secondary | ICD-10-CM | POA: Diagnosis not present

## 2024-01-21 DIAGNOSIS — E440 Moderate protein-calorie malnutrition: Secondary | ICD-10-CM | POA: Diagnosis not present

## 2024-01-21 DIAGNOSIS — G9340 Encephalopathy, unspecified: Secondary | ICD-10-CM | POA: Diagnosis not present

## 2024-01-21 DIAGNOSIS — E1110 Type 2 diabetes mellitus with ketoacidosis without coma: Secondary | ICD-10-CM | POA: Diagnosis not present

## 2024-01-21 DIAGNOSIS — N179 Acute kidney failure, unspecified: Secondary | ICD-10-CM | POA: Diagnosis not present

## 2024-01-21 LAB — BASIC METABOLIC PANEL
Anion gap: 10 (ref 5–15)
BUN: 17 mg/dL (ref 8–23)
CO2: 25 mmol/L (ref 22–32)
Calcium: 8.9 mg/dL (ref 8.9–10.3)
Chloride: 102 mmol/L (ref 98–111)
Creatinine, Ser: 0.98 mg/dL (ref 0.61–1.24)
GFR, Estimated: >60 mL/min (ref >60)
Glucose, Bld: 62 mg/dL — ABNORMAL LOW (ref 70–99)
Potassium: 3.7 mmol/L (ref 3.5–5.1)
Sodium: 137 mmol/L (ref 135–145)

## 2024-01-21 LAB — CBC
HCT: 37.2 % — ABNORMAL LOW (ref 39.0–52.0)
Hemoglobin: 12.2 g/dL — ABNORMAL LOW (ref 13.0–17.0)
MCH: 26.5 pg (ref 26.0–34.0)
MCHC: 32.8 g/dL (ref 30.0–36.0)
MCV: 80.7 fL (ref 80.0–100.0)
Platelets: 488 10*3/uL — ABNORMAL HIGH (ref 150–400)
RBC: 4.61 MIL/uL (ref 4.22–5.81)
RDW: 16.4 % — ABNORMAL HIGH (ref 11.5–15.5)
WBC: 14.8 10*3/uL — ABNORMAL HIGH (ref 4.0–10.5)
nRBC: 0 % (ref 0.0–0.2)

## 2024-01-21 LAB — GLUCOSE, CAPILLARY
Glucose-Capillary: 56 mg/dL — ABNORMAL LOW (ref 70–99)
Glucose-Capillary: 280 mg/dL — ABNORMAL HIGH (ref 70–99)
Glucose-Capillary: 480 mg/dL — ABNORMAL HIGH (ref 70–99)
Glucose-Capillary: 461 mg/dL — ABNORMAL HIGH (ref 70–99)
Glucose-Capillary: 356 mg/dL — ABNORMAL HIGH (ref 70–99)
Glucose-Capillary: 261 mg/dL — ABNORMAL HIGH (ref 70–99)

## 2024-01-21 LAB — MAGNESIUM: Magnesium: 1.8 mg/dL (ref 1.7–2.4)

## 2024-01-21 MED ORDER — INSULIN ASPART PROT & ASPART (70-30 MIX) 100 UNIT/ML ~~LOC~~ SUSP: 30.0000 [IU] | Freq: Every day | SUBCUTANEOUS | Status: DC

## 2024-01-21 MED ORDER — INSULIN ASPART PROT & ASPART (70-30 MIX) 100 UNIT/ML ~~LOC~~ SUSP
15.0000 [IU] | Freq: Every day | SUBCUTANEOUS | Status: DC
  Administered 2024-01-21 – 2024-01-22 (×2): 15 [IU] via SUBCUTANEOUS

## 2024-01-21 MED ORDER — INSULIN ASPART PROT & ASPART (70-30 MIX) 100 UNIT/ML ~~LOC~~ SUSP: 10.0000 [IU] | Freq: Every day | SUBCUTANEOUS | Status: DC

## 2024-01-21 MED ORDER — AMOXICILLIN 500 MG PO CAPS
1000.0000 mg | ORAL_CAPSULE | Freq: Three times a day (TID) | ORAL | Status: DC
  Administered 2024-01-21 – 2024-01-22 (×4): 1000 mg via ORAL
  Filled 2024-01-21 (×4): qty 2

## 2024-01-21 MED ORDER — INSULIN ASPART PROT & ASPART (70-30 MIX) 100 UNIT/ML ~~LOC~~ SUSP
15.0000 [IU] | Freq: Every day | SUBCUTANEOUS | Status: DC
  Administered 2024-01-21: 15 [IU] via SUBCUTANEOUS

## 2024-01-21 MED ORDER — INSULIN ASPART 100 UNIT/ML IJ SOLN
0.0000 [IU] | Freq: Three times a day (TID) | INTRAMUSCULAR | Status: DC
  Administered 2024-01-21: 9 [IU] via SUBCUTANEOUS
  Administered 2024-01-22: 2 [IU] via SUBCUTANEOUS
  Administered 2024-01-22: 3 [IU] via SUBCUTANEOUS

## 2024-01-21 MED ORDER — INSULIN ASPART 100 UNIT/ML IJ SOLN
20.0000 [IU] | Freq: Once | INTRAMUSCULAR | Status: AC
  Administered 2024-01-21: 20 [IU] via SUBCUTANEOUS

## 2024-01-21 MED ORDER — INSULIN ASPART 100 UNIT/ML IJ SOLN: 6.0000 [IU] | Freq: Three times a day (TID) | INTRAMUSCULAR | Status: DC

## 2024-01-21 MED ORDER — GLUCERNA SHAKE PO LIQD
237.0000 mL | Freq: Three times a day (TID) | ORAL | Status: DC
Start: 2024-01-21 — End: 2024-01-23
  Administered 2024-01-21 – 2024-01-22 (×4): 237 mL via ORAL

## 2024-01-21 MED ORDER — INSULIN ASPART PROT & ASPART (70-30 MIX) 100 UNIT/ML ~~LOC~~ SUSP
20.0000 [IU] | Freq: Every day | SUBCUTANEOUS | Status: DC
  Administered 2024-01-22: 20 [IU] via SUBCUTANEOUS

## 2024-01-21 MED ORDER — INSULIN GLARGINE-YFGN 100 UNIT/ML ~~LOC~~ SOLN
12.0000 [IU] | Freq: Every day | SUBCUTANEOUS | Status: DC
  Filled 2024-01-21: qty 0.12

## 2024-01-21 MED ORDER — INSULIN ASPART PROT & ASPART (70-30 MIX) 100 UNIT/ML ~~LOC~~ SUSP
20.0000 [IU] | Freq: Every day | SUBCUTANEOUS | Status: DC
  Administered 2024-01-21: 20 [IU] via SUBCUTANEOUS
  Filled 2024-01-21: qty 10

## 2024-01-21 NOTE — Progress Notes (Signed)
[facility]  Date of Admission:  01/17/2024      Total days of antibiotics 4   PCN           ASSESSMENT: Andre Stout is a 84 y.o. male admitted with:   Group B Strep Bacteremia 1/2 sets -  Does not appear to be related to biliary infection, would be odd organism for that. 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 Elvera Lennox outpatient with last A1C > 10%.  Has had fluctuating hypo- hyper-glycemic episodes making it challenge to manage.  Continued risk for more invasive GBS infections   OK for discharge from ID Perspective    PLAN: Can switch to high dose oral amoxicillin 1 gm TID to complete 2 weeks from cleared bcx 02/01/24.  FU with Dr. Luciana Axe 2/10 @ 2:45 to ensure no concerns after stopping treatment given unclear source    Principal Problem:   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 (HCC) Active Problems:   AKI (acute kidney injury) (HCC)   Benign essential HTN   Heart block AV complete (HCC)   Hyponatremia   History of CVA (cerebrovascular accident)   Hyperlipidemia   Leukocytosis   Acute encephalopathy   fracture of distal diaphyseal metaphyseal junction of the 5th left metacarpal with dorsal angulation   Cholecystostomy care (HCC)   Malnutrition of moderate degree    amLODipine  10 mg Oral Daily   amoxicillin  1,000 mg Oral TID   aspirin  81 mg Oral Daily   atorvastatin  40 mg Oral Daily   brimonidine  1 drop Both Eyes BID WC   clopidogrel  75 mg Oral Daily   dorzolamide-timolol  1 drop Both Eyes BID WC   enoxaparin (LOVENOX) injection  40 mg Subcutaneous Q24H   feeding supplement  237 mL Oral BID BM   insulin aspart  0-9  Units Subcutaneous TID WC   insulin aspart protamine- aspart  15 Units Subcutaneous Q supper   [START ON 01/22/2024] insulin aspart protamine- aspart  20 Units Subcutaneous QAC lunch   [START ON 01/22/2024] insulin aspart protamine- aspart  30 Units Subcutaneous Q breakfast   latanoprost  1 drop Both Eyes QHS   multivitamin with minerals  1 tablet Oral Daily   pantoprazole  40 mg Oral BID AC   sodium chloride flush  5 mL Intracatheter Q8H    SUBJECTIVE: No new concerns.   Review of Systems: Review of Systems  Constitutional:  Negative for chills and fever.  Respiratory: Negative.    Cardiovascular: Negative.     No Known Allergies  OBJECTIVE: Vitals:   01/20/24 2336 01/21/24 0357 01/21/24 0800 01/21/24 1200  BP: (!) 112/39 (!) 142/71 134/62 136/71  Pulse:   71 (!) 59  Resp: 14  18 16   Temp: 98 F (36.7 C) 98 F (36.7 C) (!) 97.4 F (36.3 C) 97.6 F (36.4 C)  TempSrc: Oral Oral Oral Oral  SpO2:      Weight:      Height:       Body mass index is 27.46 kg/m.  Physical Exam Constitutional:      Appearance:  Normal appearance. He is not ill-appearing.  HENT:     Head: Normocephalic.     Mouth/Throat:     Mouth: Mucous membranes are moist.     Pharynx: Oropharynx is clear.  Eyes:     General: No scleral icterus. Cardiovascular:     Rate and Rhythm: Normal rate and regular rhythm.  Pulmonary:     Effort: Pulmonary effort is normal.  Musculoskeletal:        General: Normal range of motion.     Cervical back: Normal range of motion.  Skin:    Coloration: Skin is not jaundiced or pale.  Neurological:     Mental Status: He is alert and oriented to person, place, and time.  Psychiatric:        Mood and Affect: Mood normal.        Judgment: Judgment normal.     Lab Results Lab Results  Component Value Date   WBC 14.8 (H) 01/21/2024   HGB 12.2 (L) 01/21/2024   HCT 37.2 (L) 01/21/2024   MCV 80.7 01/21/2024   PLT 488 (H) 01/21/2024    Lab Results  Component  Value Date   CREATININE 0.98 01/21/2024   BUN 17 01/21/2024   NA 137 01/21/2024   K 3.7 01/21/2024   CL 102 01/21/2024   CO2 25 01/21/2024    Lab Results  Component Value Date   ALT 13 01/17/2024   AST 18 01/17/2024   ALKPHOS 106 01/17/2024   BILITOT 2.1 (H) 01/17/2024     Microbiology: Recent Results (from the past 240 hours)  Culture, blood (Routine X 2) w Reflex to ID Panel     Status: Abnormal   Collection Time: 01/17/24  5:44 PM   Specimen: BLOOD RIGHT ARM  Result Value Ref Range Status   Specimen Description BLOOD RIGHT ARM  Final   Special Requests   Final    BOTTLES DRAWN AEROBIC AND ANAEROBIC Blood Culture results may not be optimal due to an inadequate volume of blood received in culture bottles   Culture  Setup Time   Final    GRAM POSITIVE COCCI IN CHAINS IN BOTH AEROBIC AND ANAEROBIC BOTTLES CRITICAL RESULT CALLED TO, READ BACK BY AND VERIFIED WITH: PHARMD J. MILLEN [id number] AT 1055, ADC    Culture (A)  Final    GROUP B STREP(S.AGALACTIAE)ISOLATED STAPHYLOCOCCUS EPIDERMIDIS THE SIGNIFICANCE OF ISOLATING THIS ORGANISM FROM A SINGLE SET OF BLOOD CULTURES WHEN MULTIPLE SETS ARE DRAWN IS UNCERTAIN. PLEASE NOTIFY THE MICROBIOLOGY DEPARTMENT WITHIN ONE WEEK IF SPECIATION AND SENSITIVITIES ARE REQUIRED. Performed at [facility], [address] [place], [place] [address]    Report Status 01/20/2024 FINAL  Final   Organism ID, Bacteria GROUP B STREP(S.AGALACTIAE)ISOLATED  Final      Susceptibility   Group b strep(s.agalactiae)isolated - MIC*    CLINDAMYCIN >=1 RESISTANT Resistant     AMPICILLIN <=0.25 SENSITIVE Sensitive     ERYTHROMYCIN >=8 RESISTANT Resistant     VANCOMYCIN 0.5 SENSITIVE Sensitive     CEFTRIAXONE <=0.12 SENSITIVE Sensitive     LEVOFLOXACIN 0.5 SENSITIVE Sensitive     PENICILLIN <=0.06 SENSITIVE Sensitive     * GROUP B STREP(S.AGALACTIAE)ISOLATED  Blood Culture ID Panel (Reflexed)     Status: Abnormal   Collection Time: 01/17/24  5:44 PM   Result Value Ref Range Status   Enterococcus faecalis NOT DETECTED NOT DETECTED Final   Enterococcus Faecium NOT DETECTED NOT DETECTED Final   Listeria monocytogenes NOT DETECTED NOT DETECTED  Final   Staphylococcus species NOT DETECTED NOT DETECTED Final   Staphylococcus aureus (BCID) NOT DETECTED NOT DETECTED Final   Staphylococcus epidermidis NOT DETECTED NOT DETECTED Final   Staphylococcus lugdunensis NOT DETECTED NOT DETECTED Final   Streptococcus species DETECTED (A) NOT DETECTED Final    Comment: CRITICAL RESULT CALLED TO, READ BACK BY AND VERIFIED WITH: PHARMD J. MILLEN [id number] AT 1055, ADC    Streptococcus agalactiae DETECTED (A) NOT DETECTED Final    Comment: CRITICAL RESULT CALLED TO, READ BACK BY AND VERIFIED WITH: PHARMD J. MILLEN [id number] AT 1055, ADC    Streptococcus pneumoniae NOT DETECTED NOT DETECTED Final   Streptococcus pyogenes NOT DETECTED NOT DETECTED Final   A.calcoaceticus-baumannii NOT DETECTED NOT DETECTED Final   Bacteroides fragilis NOT DETECTED NOT DETECTED Final   Enterobacterales NOT DETECTED NOT DETECTED Final   Enterobacter cloacae complex NOT DETECTED NOT DETECTED Final   Escherichia coli NOT DETECTED NOT DETECTED Final   Klebsiella aerogenes NOT DETECTED NOT DETECTED Final   Klebsiella oxytoca NOT DETECTED NOT DETECTED Final   Klebsiella pneumoniae NOT DETECTED NOT DETECTED Final   Proteus species NOT DETECTED NOT DETECTED Final   Salmonella species NOT DETECTED NOT DETECTED Final   Serratia marcescens NOT DETECTED NOT DETECTED Final   Haemophilus influenzae NOT DETECTED NOT DETECTED Final   Neisseria meningitidis NOT DETECTED NOT DETECTED Final   Pseudomonas aeruginosa NOT DETECTED NOT DETECTED Final   Stenotrophomonas maltophilia NOT DETECTED NOT DETECTED Final   Candida albicans NOT DETECTED NOT DETECTED Final   Candida auris NOT DETECTED NOT DETECTED Final   Candida glabrata NOT DETECTED NOT DETECTED Final   Candida krusei NOT DETECTED NOT  DETECTED Final   Candida parapsilosis NOT DETECTED NOT DETECTED Final   Candida tropicalis NOT DETECTED NOT DETECTED Final   Cryptococcus neoformans/gattii NOT DETECTED NOT DETECTED Final    Comment: Performed at [facility], [address] [place], [place] [address]  Culture, blood (Routine X 2) w Reflex to ID Panel     Status: None (Preliminary result)   Collection Time: 01/17/24  6:22 PM   Specimen: BLOOD RIGHT HAND  Result Value Ref Range Status   Specimen Description BLOOD RIGHT HAND  Final   Special Requests   Final    BOTTLES DRAWN AEROBIC AND ANAEROBIC Blood Culture results may not be optimal due to an inadequate volume of blood received in culture bottles   Culture   Final    NO GROWTH 4 DAYS Performed at [facility], [address] [place], [place] [address]    Report Status PENDING  Incomplete  Urine Culture (for pregnant, neutropenic or urologic patients or patients with an indwelling urinary catheter)     Status: Abnormal   Collection Time: 01/18/24  8:41 AM   Specimen: Urine, Clean Catch  Result Value Ref Range Status   Specimen Description URINE, CLEAN CATCH  Final   Special Requests   Final    NONE Performed at [facility], [address] [place], [place] [address]    Culture >=100,000 COLONIES/mL KLEBSIELLA PNEUMONIAE (A)  Final   Report Status 01/20/2024 FINAL  Final   Organism ID, Bacteria KLEBSIELLA PNEUMONIAE (A)  Final      Susceptibility   Klebsiella pneumoniae - MIC*    AMPICILLIN RESISTANT Resistant     CEFAZOLIN <=4 SENSITIVE Sensitive     CEFEPIME <=0.12 SENSITIVE Sensitive     CEFTRIAXONE <=0.25 SENSITIVE Sensitive     CIPROFLOXACIN <=0.25  SENSITIVE Sensitive     GENTAMICIN <=1 SENSITIVE Sensitive     IMIPENEM <=0.25 SENSITIVE Sensitive     NITROFURANTOIN 64 INTERMEDIATE Intermediate     TRIMETH/SULFA <=20 SENSITIVE Sensitive     AMPICILLIN/SULBACTAM 4 SENSITIVE Sensitive     PIP/TAZO <=4 SENSITIVE Sensitive ug/mL    *  >=100,000 COLONIES/mL KLEBSIELLA PNEUMONIAE  Culture, blood (Routine X 2) w Reflex to ID Panel     Status: None (Preliminary result)   Collection Time: 01/18/24  3:43 PM   Specimen: BLOOD LEFT ARM  Result Value Ref Range Status   Specimen Description BLOOD LEFT ARM  Final   Special Requests   Final    BOTTLES DRAWN AEROBIC AND ANAEROBIC Blood Culture results may not be optimal due to an inadequate volume of blood received in culture bottles   Culture   Final    NO GROWTH 3 DAYS Performed at [facility], [address], [place], [place] [address]    Report Status PENDING  Incomplete  Culture, blood (Routine X 2) w Reflex to ID Panel     Status: None (Preliminary result)   Collection Time: 01/18/24  3:46 PM   Specimen: BLOOD LEFT HAND  Result Value Ref Range Status   Specimen Description BLOOD LEFT HAND  Final   Special Requests   Final    BOTTLES DRAWN AEROBIC AND ANAEROBIC Blood Culture results may not be optimal due to an inadequate volume of blood received in culture bottles   Culture   Final    NO GROWTH 3 DAYS Performed at [facility], [address] [place], [place] [address]    Report Status PENDING  Incomplete    Rexene Alberts, MSN, NP-C [facility]  [email or web address] Pager: [phone] Office: [phone] [facility] Main Line: [phone] *Secure Chat Communication Welcome

## 2024-01-21 NOTE — TOC Progression Note (Addendum)
Transition of Care [facility]) - Progression Note    Patient Details  Name: Andre Stout MRN: [id number] Date of Birth: 1940-12-05  [facility]) CM/SW Contact  Gordy Clement, RN Phone Number: 01/21/2024, 1:53 PM  Clinical Narrative:     Waiting for ID recommendations for DC meds.  Jeri Modena has been notified of possible IVBX need and is also following    Home Health with [facility] has been arranged Patient will DC to home with Daughter . Has all DME needed     Expected Discharge Plan: Home w Home Health Services Barriers to Discharge: Continued Medical Work up, English as a second language teacher  Expected Discharge Plan and Services In-house Referral: Clinical Social Work   Post Acute Care Choice: Home Health, Skilled Nursing Facility Living arrangements for the past 2 months: Single Family Home                                       Social Determinants of Health (SDOH) Interventions SDOH Screenings   Food Insecurity: Patient Unable To Answer (01/19/2024)  Housing: Patient Unable To Answer (01/19/2024)  Transportation Needs: Patient Unable To Answer (01/19/2024)  Utilities: Patient Unable To Answer (01/19/2024)  Alcohol Screen: Low Risk  (07/24/2023)  Depression (PHQ2-9): Low Risk  (07/24/2023)  Financial Resource Strain: Low Risk  (07/24/2023)  Physical Activity: Inactive (07/24/2023)  Social Connections: Patient Unable To Answer (01/19/2024)  Stress: No Stress Concern Present (07/24/2023)  Tobacco Use: Medium Risk (01/17/2024)  Health Literacy: Adequate Health Literacy (07/24/2023)    Readmission Risk Interventions    08/15/2022    9:25 AM  Readmission Risk Prevention Plan  Transportation Screening Complete  PCP or Specialist Appt within 5-7 Days Complete  Home Care Screening Complete  Medication Review (RN CM) Complete

## 2024-01-21 NOTE — Progress Notes (Signed)
Brief note.  RN reached out for  patient. Due to poor intake related to missing dentures. Patient asking for softer diet for better chewing.  Will change to Dys 3 to help assist in better oral intake of meals.  Jamelle Haring RDN, LDN Clinical Dietitian   If unable to reach, please contact "RD Inpatient" secure chat group between 8 am-4 pm daily"

## 2024-01-21 NOTE — Inpatient Diabetes Management (Signed)
Inpatient Diabetes Program Recommendations  AACE/ADA: New Consensus Statement on Inpatient Glycemic Control (2015)  Target Ranges:  Prepandial:   less than 140 mg/dL      Peak postprandial:   less than 180 mg/dL (1-2 hours)      Critically ill patients:  140 - 180 mg/dL   Lab Results  Component Value Date   GLUCAP 280 (H) 01/21/2024   HGBA1C 10.7 (A) 11/14/2023    Latest Reference Range & Units 01/20/24 08:45 01/20/24 11:48 01/20/24 15:39 01/20/24 20:04 01/20/24 23:39 01/21/24 03:56 01/21/24 08:13  Glucose-Capillary 70 - 99 mg/dL 469 (H) 629 (H) 5284 pm  Novolog 5 units 352 (H) 1713 pm  Novolog 19 units 261 (H)   2018 pm  Novolog 8 units 83 56 (L) 280 (H)  (H): Data is abnormally high (L): Data is abnormally low  Diabetes history: DM2 Outpatient Diabetes medications: 70/30 20 AM, 15 PM; Prednisone 20 QAM, FSL3  Current orders for Inpatient glycemic control: Novolog 70/30 20 units am, 15 units ac lunch, 10 units ac supper, Novolog 0-15 units q 4 hrs. correction  Inpatient Diabetes Program Recommendations:   01/20/24: Patient received  1422 pm  Novolog 5 units 1713 pm  Novolog 19 units  2018 pm  Novolog 8 units Patient had hypoglycemia post Novolog correction @ hs. Please consider: -Decrease Novolog 0-9 units tid, 0-5 units hs  Thank you, Darel Hong E. Wajiha Versteeg, RN, MSN, CDCES  Diabetes Coordinator Inpatient Glycemic Control Team Team Pager [phone] (8am-5pm) 01/21/2024 9:26 AM

## 2024-01-21 NOTE — Progress Notes (Signed)
Nutrition Follow-up  DOCUMENTATION CODES:   Non-severe (moderate) malnutrition in context of acute illness/injury  INTERVENTION:  DYS 3 Carb modified Discontinue ensure Glucerna Shake po TID, each supplement provides 220 kcal and 10 grams of protein Multivitamin/minerals   NUTRITION DIAGNOSIS:   Moderate Malnutrition related to acute illness as evidenced by mild fat depletion, mild muscle depletion, energy intake < or equal to 50% for > or equal to 5 days.    GOAL:   Patient will meet greater than or equal to 90% of their needs    MONITOR:   PO intake, Supplement acceptance  REASON FOR ASSESSMENT:   Consult Assessment of nutrition requirement/status  ASSESSMENT:   84 y.o. M was brought to from to ED by daughter. With reports of increased confusion from baseline, hyperglycemia, weakness, vomiting and not eating in 4-5 days, although drinking fluid. Daughter reported a fall on Sunday the 12th. Admitted for diabetic ketoacidosis with type 2 diabetes mellitus and acute encephalopathy. PMH: DM2, prstate cancer, HTN.  MD reached out to RD to change him Carb modified diet due to uncontrolled Sugars. Per Discussion wit MD patient is eating ~ 50% of most meals. Has missing lower dentures and requesting Mech soft diet. Has and HgbA1C upon admission of 10.7.  Suspect sugar levels related to Insulin regimen and not dietary intake.  Diabetes educator recs: Inpatient Diabetes Program Recommendations:   01/20/24: Patient received  1422 pm  Novolog 5 units 1713 pm  Novolog 19 units  2018 pm  Novolog 8 units  Glucose, Bld 62 Low  277 High  CM 167 High  CM 140 High  CM 270 High  CM 186 High  CM 107 High    NUTRITION - FOCUSED PHYSICAL EXAM:  Flowsheet Row Most Recent Value  Orbital Region Mild depletion  Upper Arm Region Mild depletion  Thoracic and Lumbar Region No depletion  Buccal Region Mild depletion  Temple Region Moderate depletion  Clavicle Bone Region Mild depletion   Clavicle and Acromion Bone Region Mild depletion  Scapular Bone Region Unable to assess  Dorsal Hand Mild depletion  Patellar Region Mild depletion  Anterior Thigh Region No depletion  Posterior Calf Region No depletion  Edema (RD Assessment) None  Hair Reviewed  Eyes Reviewed  Mouth Reviewed  [missing bottom dentures]  Skin Reviewed  Nails Reviewed       Diet Order:   Diet Order             DIET DYS 3 Room service appropriate? Yes with Assist; Fluid consistency: Thin  Diet effective now                   EDUCATION NEEDS:   Not appropriate for education at this time  Skin:  Skin Assessment: Reviewed RN Assessment  Last BM:  01/21/24 type 5  Height:   Ht Readings from Last 1 Encounters:  01/17/24 5\' 5"  (1.651 m)    Weight:   Wt Readings from Last 1 Encounters:  01/17/24 74.8 kg    Ideal Body Weight:     BMI:  Body mass index is 27.46 kg/m.  Estimated Nutritional Needs:   Kcal:  2250-2600 kcal  Protein:  100-120 g  Fluid:  1875-2250 ml    Jamelle Haring RDN, LDN Clinical Dietitian   If unable to reach, please contact "RD Inpatient" secure chat group between 8 am-4 pm daily"

## 2024-01-21 NOTE — Plan of Care (Signed)

## 2024-01-21 NOTE — Care Management Important Message (Signed)
Important Message  Patient Details  Name: Andre Stout MRN: [id number] Date of Birth: Oct 29, 1940   Important Message Given:  Yes - Medicare IM     Dorena Bodo 01/21/2024, 3:53 PM

## 2024-01-21 NOTE — Plan of Care (Signed)

## 2024-01-21 NOTE — Progress Notes (Signed)
Physical Therapy Treatment Patient Details Name: Andre Stout MRN: [id number] DOB: Aug 23, 1940 Today's Date: 01/21/2024   History of Present Illness 84 yo male presents to ED 1/17 with AMS, DKA and streptococcal bacteremia, recent fall. Pt found to have fracture of distal diaphyseal metaphyseal junction of the 5th left metacarpal with dorsal angulation, ulnar gutter splint donned. CTH negative for acute findings. PMH: AV block, hypertension, type 2 diabetes, prostate cancer, glaucoma, HTNm, Cholecystitis s/p cholecystostomy in 07/2022.    PT Comments  Pt received in supine and agreeable to session. Pt noted to be incontinent of bowel upon entry and requires assist with pericare. Pt able to tolerate extended standing for pericare before requiring a seated rest break due to fatigue. Pt demonstrates fair stability during step pivot to recliner with CGA for safety and cues for sequencing. Pt able to perform static standing marches without LOB. Deferred further gait trial due to increased fatigue. Pt follows commands well throughout session, however is very HOH. Pt continues to benefit from PT services to progress toward functional mobility goals.     If plan is discharge home, recommend the following: A lot of help with walking and/or transfers;A lot of help with bathing/dressing/bathroom   Can travel by private vehicle     No  Equipment Recommendations  None recommended by PT    Recommendations for Other Services       Precautions / Restrictions Precautions Precautions: Fall Precaution Comments: supposed to have L ulnar gutter splint Restrictions Weight Bearing Restrictions Per Provider Order: Yes LUE Weight Bearing Per Provider Order: Non weight bearing (assumed given fx)     Mobility  Bed Mobility Overal bed mobility: Needs Assistance Bed Mobility: Supine to Sit     Supine to sit: Min assist, HOB elevated, Used rails     General bed mobility comments: min A for trunk  elevation    Transfers Overall transfer level: Needs assistance Equipment used: Rolling walker (2 wheels) Transfers: Sit to/from Stand, Bed to chair/wheelchair/BSC Sit to Stand: Min assist   Step pivot transfers: Contact guard assist       General transfer comment: From EOB x2 with min A for power up. Pt able to step pivot to recliner with CGA for safety and cues for RW management    Ambulation/Gait             Pre-gait activities: static standing marches         Balance Overall balance assessment: Needs assistance, History of Falls Sitting-balance support: Feet supported, Single extremity supported Sitting balance-Leahy Scale: Fair Sitting balance - Comments: sitting EOB   Standing balance support: Bilateral upper extremity supported, During functional activity Standing balance-Leahy Scale: Poor                              Cognition Arousal: Alert Behavior During Therapy: Flat affect Overall Cognitive Status: Impaired/Different from baseline                                          Exercises General Exercises - Lower Extremity Hip Flexion/Marching: AROM, Standing, Both, 10 reps    General Comments        Pertinent Vitals/Pain Pain Assessment Pain Assessment: No/denies pain     PT Goals (current goals can now be found in the care plan section) Acute Rehab PT Goals PT Goal  Formulation: With patient Time For Goal Achievement: 02/01/24 Progress towards PT goals: Progressing toward goals    Frequency    Min 1X/week       AM-PAC PT "6 Clicks" Mobility   Outcome Measure  Help needed turning from your back to your side while in a flat bed without using bedrails?: A Little Help needed moving from lying on your back to sitting on the side of a flat bed without using bedrails?: A Little Help needed moving to and from a bed to a chair (including a wheelchair)?: A Little Help needed standing up from a chair using your  arms (e.g., wheelchair or bedside chair)?: A Little Help needed to walk in hospital room?: A Lot Help needed climbing 3-5 steps with a railing? : A Lot 6 Click Score: 16    End of Session Equipment Utilized During Treatment: Gait belt Activity Tolerance: Patient limited by fatigue;Patient tolerated treatment well Patient left: with call bell/phone within reach;in chair;with chair alarm set (with mitts donned) Nurse Communication: Mobility status PT Visit Diagnosis: Other abnormalities of gait and mobility (R26.89);Muscle weakness (generalized) (M62.81)     Time: 6578-4696 PT Time Calculation (min) (ACUTE ONLY): 28 min  Charges:    $Therapeutic Activity: 23-37 mins PT General Charges $$ ACUTE PT VISIT: 1 Visit                     Johny Shock, PTA Acute Rehabilitation Services Secure Chat Preferred  [phone]    Johny Shock 01/21/2024, 1:29 PM

## 2024-01-21 NOTE — Progress Notes (Signed)
PROGRESS NOTE        PATIENT DETAILS Name: Andre Stout Age: 84 y.o. Sex: male Date of Birth: 05-24-1940 Admit Date: 01/17/2024 Admitting Physician Orland Mustard, MD EAV:[id number], Allyne Gee, FNP  Brief Summary: Patient is a 84 y.o.  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  Significant events: 1/17>> admit to [facility]  Significant studies: 1/17>> x-ray left hand: Fracture at the distal diaphyseal/metaphyseal junction of fifth metacarpal 1/17>> CXR: No PNA 1/17>> CT head: No acute finding 1/17>> CT C-spine: No acute finding. 1/19>> echo: EF 65-70%  Significant microbiology data: 1/17>> blood culture 1/2: Group B strep, staph epidermidis 1/18>> blood culture: Pending 1/18>> urine culture: Klebsiella pneumoniae  Procedures: 1/20>> cholecystostomy tube exchange by IR  Consults: Infectious disease  Subjective: Lying comfortably in bed not confused-answer simple questions appropriately.  Objective: Vitals: Blood pressure 134/62, pulse 71, temperature (!) 97.4 F (36.3 C), temperature source Oral, resp. rate 18, height 5\' 5"  (1.651 m), weight 74.8 kg, SpO2 96%.   Exam: Gen Exam:Alert awake-not in any distress HEENT:atraumatic, normocephalic Chest: B/L clear to auscultation anteriorly CVS:S1S2 regular Abdomen:soft non tender, non distended Extremities:no edema Neurology: Non focal Skin: no rash  Pertinent Labs/Radiology:    Latest Ref Rng & Units 01/21/2024    4:14 AM 01/20/2024    3:35 AM 01/19/2024    5:10 AM  CBC  WBC 4.0 - 10.5 K/uL 14.8  16.5  20.7   Hemoglobin 13.0 - 17.0 g/dL 91.4  78.2  95.6   Hematocrit 39.0 - 52.0 % 37.2  35.8  34.5   Platelets 150 - 400 K/uL 488  517  511     Lab Results  Component Value Date   NA  137 01/21/2024   K 3.7 01/21/2024   CL 102 01/21/2024   CO2 25 01/21/2024     Assessment/Plan: DKA Resolved with IV insulin/IVF Likely triggered by possible bacteremia  DM-2 with uncontrolled hyperglycemia/DKA (A1c 10.07 November 2023) CBGs continue to be difficult to control with hypoglycemic and hyperglycemic episodes Will switch him to his usual home regimen of 70/30-will reduce dose slightly-and see how he does   Recent Labs    01/20/24 2339 01/21/24 0356 01/21/24 0813  GLUCAP 83 56* 280*   Acute metabolic encephalopathy Likely due to DKA/bacteremia Overall better-much more awake and alert-suspect back to baseline. CT imaging negative Maintain delirium precautions  Streptococcal bacteremia Afebrile-leukocytosis downtrending Evaluated by ID-now on penicillin G Repeat cultures negative-transthoracic echo without any obvious vegetation.   Await recommendations from infectious disease regarding discharge antibiotic regimen.  Asymptomatic bacteriuria No symptoms of UTI-nevertheless-already on antibiotic that should cover.  History of cholecystitis-s/p cholecystostomy tube in place IR performed cholecystostomy tube exchange 1/20  History of intermittent complete heart block Telemetry monitoring Avoid AV nodal blocking agents Per prior notes-is being monitored closely by EP/cardiology  History of CVA Appears to be moving all 4 extremities Aspirin/Plavix/statin  HTN BP stable Continue amlodipine  Hypokalemia Repleted  AKI Likely hemodynamically mediated Resolved with supportive care  Hypomagnesemia Repleted  Fracture of distal middle Faisal diaphyseal junction-left fifth metacarpal Per prior notes-Ortho consulted-ulnar splint-Dr. Yehuda Budd recommended follow-up in 1 week.  Nutrition Status: Nutrition Problem: Moderate Malnutrition Etiology: acute illness Signs/Symptoms: mild fat  depletion, mild muscle depletion, energy intake < or equal to 50% for > or  equal to 5 days Interventions: Ensure Enlive (each supplement provides 350kcal and 20 grams of protein), Hormel Shake, MVI, Liberalize Diet    BMI: Estimated body mass index is 27.46 kg/m as calculated from the following:   Height as of this encounter: 5\' 5"  (1.651 m).   Weight as of this encounter: 74.8 kg.   Code status:   Code Status: Full Code   DVT Prophylaxis: enoxaparin (LOVENOX) injection 40 mg Start: 01/18/24 1830   Family Communication: Daughter [name] updated 1/19   Disposition Plan: Status is: Inpatient Remains inpatient appropriate because: Severity of illness   Planned Discharge Destination:Home health   Diet: Diet Order             Diet regular Room service appropriate? Yes with Assist; Fluid consistency: Thin  Diet effective now                     Antimicrobial agents: Anti-infectives (From admission, onward)    Start     Dose/Rate Route Frequency Ordered Stop   01/19/24 1445  penicillin G potassium 12 Million Units in sodium chloride 0.9 % 500 mL CONTINUOUS infusion        12 Million Units 41.7 mL/hr over 12 Hours Intravenous Every 12 hours 01/19/24 1348     01/17/24 1745  cefTRIAXone (ROCEPHIN) 2 g in sodium chloride 0.9 % 100 mL IVPB  Status:  Discontinued        2 g 200 mL/hr over 30 Minutes Intravenous Every 24 hours 01/17/24 1738 01/19/24 1303        MEDICATIONS: Scheduled Meds:  amLODipine  10 mg Oral Daily   aspirin  81 mg Oral Daily   atorvastatin  40 mg Oral Daily   brimonidine  1 drop Both Eyes BID WC   clopidogrel  75 mg Oral Daily   dorzolamide-timolol  1 drop Both Eyes BID WC   enoxaparin (LOVENOX) injection  40 mg Subcutaneous Q24H   feeding supplement  237 mL Oral BID BM   insulin aspart  0-9 Units Subcutaneous TID WC   insulin aspart protamine- aspart  10 Units Subcutaneous Q supper   insulin aspart protamine- aspart  15 Units Subcutaneous QAC lunch   insulin aspart protamine- aspart  20 Units  Subcutaneous Q breakfast   latanoprost  1 drop Both Eyes QHS   multivitamin with minerals  1 tablet Oral Daily   pantoprazole  40 mg Oral BID AC   sodium chloride flush  5 mL Intracatheter Q8H   Continuous Infusions:  penicillin G potassium 12 Million Units in sodium chloride 0.9 % 500 mL CONTINUOUS infusion 12 Million Units (01/21/24 0549)   PRN Meds:.acetaminophen **OR** acetaminophen, dextrose, iohexol, ondansetron **OR** ondansetron (ZOFRAN) IV   I have personally reviewed following labs and imaging studies  LABORATORY DATA: CBC: Recent Labs  Lab 01/17/24 1342 01/17/24 1353 01/17/24 1354 01/18/24 0536 01/19/24 0510 01/20/24 0335 01/21/24 0414  WBC 19.3*  --   --  19.9* 20.7* 16.5* 14.8*  NEUTROABS  --   --   --   --   --  13.2*  --   HGB 13.3   < > 14.6 12.0* 11.3* 11.6* 12.2*  HCT 40.8   < > 43.0 36.6* 34.5* 35.8* 37.2*  MCV 80.5  --   --  79.4* 78.4* 80.1 80.7  PLT 571*  --   --  519* 511* 517*  488*   < > = values in this interval not displayed.    Basic Metabolic Panel: Recent Labs  Lab 01/17/24 1342 01/17/24 1353 01/18/24 0536 01/18/24 0745 01/19/24 0510 01/20/24 0335 01/21/24 0414  NA 128*   < > 133* 136 139 138 137  K 5.0   < > 3.7 3.5 3.4* 3.9 3.7  CL 89*   < > 98 101 103 98 102  CO2 20*   < > 21* 24 25 26 25   GLUCOSE 424*   < > 270* 140* 167* 277* 62*  BUN 52*   < > 40* 38* 21 15 17   CREATININE 2.08*   < > 1.52* 1.50* 1.16 1.03 0.98  CALCIUM 10.1   < > 9.5 9.7 9.4 9.1 8.9  MG 1.9  --   --   --   --  1.3* 1.8  PHOS 3.1  --   --   --   --   --   --    < > = values in this interval not displayed.    GFR: Estimated Creatinine Clearance: 54 mL/min (by C-G formula based on SCr of 0.98 mg/dL).  Liver Function Tests: Recent Labs  Lab 01/17/24 1342  AST 18  ALT 13  ALKPHOS 106  BILITOT 2.1*  PROT 7.5  ALBUMIN 2.8*   Recent Labs  Lab 01/17/24 1342  LIPASE 22   Recent Labs  Lab 01/17/24 1810  AMMONIA 19    Coagulation Profile: No  results for input(s): "INR", "PROTIME" in the last 168 hours.  Cardiac Enzymes: No results for input(s): "CKTOTAL", "CKMB", "CKMBINDEX", "TROPONINI" in the last 168 hours.  BNP (last 3 results) No results for input(s): "PROBNP" in the last 8760 hours.  Lipid Profile: No results for input(s): "CHOL", "HDL", "LDLCALC", "TRIG", "CHOLHDL", "LDLDIRECT" in the last 72 hours.  Thyroid Function Tests: No results for input(s): "TSH", "T4TOTAL", "FREET4", "T3FREE", "THYROIDAB" in the last 72 hours.   Anemia Panel: No results for input(s): "VITAMINB12", "FOLATE", "FERRITIN", "TIBC", "IRON", "RETICCTPCT" in the last 72 hours.   Urine analysis:    Component Value Date/Time   COLORURINE YELLOW 01/17/2024 1647   APPEARANCEUR CLOUDY (A) 01/17/2024 1647   LABSPEC 1.016 01/17/2024 1647   PHURINE 5.0 01/17/2024 1647   GLUCOSEU >=500 (A) 01/17/2024 1647   HGBUR LARGE (A) 01/17/2024 1647   BILIRUBINUR NEGATIVE 01/17/2024 1647   KETONESUR 5 (A) 01/17/2024 1647   PROTEINUR 30 (A) 01/17/2024 1647   UROBILINOGEN 1.0 10/04/2009 1109   NITRITE NEGATIVE 01/17/2024 1647   LEUKOCYTESUR MODERATE (A) 01/17/2024 1647    Sepsis Labs: Lactic Acid, Venous    Component Value Date/Time   LATICACIDVEN 3.85 (HH) 03/04/2016 1417    MICROBIOLOGY: Recent Results (from the past 240 hours)  Culture, blood (Routine X 2) w Reflex to ID Panel     Status: Abnormal   Collection Time: 01/17/24  5:44 PM   Specimen: BLOOD RIGHT ARM  Result Value Ref Range Status   Specimen Description BLOOD RIGHT ARM  Final   Special Requests   Final    BOTTLES DRAWN AEROBIC AND ANAEROBIC Blood Culture results may not be optimal due to an inadequate volume of blood received in culture bottles   Culture  Setup Time   Final    GRAM POSITIVE COCCI IN CHAINS IN BOTH AEROBIC AND ANAEROBIC BOTTLES CRITICAL RESULT CALLED TO, READ BACK BY AND VERIFIED WITH: PHARMD J. MILLEN [id number] AT 1055, ADC    Culture (A)  Final  GROUP B  STREP(S.AGALACTIAE)ISOLATED STAPHYLOCOCCUS EPIDERMIDIS THE SIGNIFICANCE OF ISOLATING THIS ORGANISM FROM A SINGLE SET OF BLOOD CULTURES WHEN MULTIPLE SETS ARE DRAWN IS UNCERTAIN. PLEASE NOTIFY THE MICROBIOLOGY DEPARTMENT WITHIN ONE WEEK IF SPECIATION AND SENSITIVITIES ARE REQUIRED. Performed at [facility], [address], [place], [place] [address]    Report Status 01/20/2024 FINAL  Final   Organism ID, Bacteria GROUP B STREP(S.AGALACTIAE)ISOLATED  Final      Susceptibility   Group b strep(s.agalactiae)isolated - MIC*    CLINDAMYCIN >=1 RESISTANT Resistant     AMPICILLIN <=0.25 SENSITIVE Sensitive     ERYTHROMYCIN >=8 RESISTANT Resistant     VANCOMYCIN 0.5 SENSITIVE Sensitive     CEFTRIAXONE <=0.12 SENSITIVE Sensitive     LEVOFLOXACIN 0.5 SENSITIVE Sensitive     PENICILLIN <=0.06 SENSITIVE Sensitive     * GROUP B STREP(S.AGALACTIAE)ISOLATED  Blood Culture ID Panel (Reflexed)     Status: Abnormal   Collection Time: 01/17/24  5:44 PM  Result Value Ref Range Status   Enterococcus faecalis NOT DETECTED NOT DETECTED Final   Enterococcus Faecium NOT DETECTED NOT DETECTED Final   Listeria monocytogenes NOT DETECTED NOT DETECTED Final   Staphylococcus species NOT DETECTED NOT DETECTED Final   Staphylococcus aureus (BCID) NOT DETECTED NOT DETECTED Final   Staphylococcus epidermidis NOT DETECTED NOT DETECTED Final   Staphylococcus lugdunensis NOT DETECTED NOT DETECTED Final   Streptococcus species DETECTED (A) NOT DETECTED Final    Comment: CRITICAL RESULT CALLED TO, READ BACK BY AND VERIFIED WITH: PHARMD J. MILLEN [id number] AT 1055, ADC    Streptococcus agalactiae DETECTED (A) NOT DETECTED Final    Comment: CRITICAL RESULT CALLED TO, READ BACK BY AND VERIFIED WITH: PHARMD J. MILLEN [id number] AT 1055, ADC    Streptococcus pneumoniae NOT DETECTED NOT DETECTED Final   Streptococcus pyogenes NOT DETECTED NOT DETECTED Final   A.calcoaceticus-baumannii NOT DETECTED NOT DETECTED Final    Bacteroides fragilis NOT DETECTED NOT DETECTED Final   Enterobacterales NOT DETECTED NOT DETECTED Final   Enterobacter cloacae complex NOT DETECTED NOT DETECTED Final   Escherichia coli NOT DETECTED NOT DETECTED Final   Klebsiella aerogenes NOT DETECTED NOT DETECTED Final   Klebsiella oxytoca NOT DETECTED NOT DETECTED Final   Klebsiella pneumoniae NOT DETECTED NOT DETECTED Final   Proteus species NOT DETECTED NOT DETECTED Final   Salmonella species NOT DETECTED NOT DETECTED Final   Serratia marcescens NOT DETECTED NOT DETECTED Final   Haemophilus influenzae NOT DETECTED NOT DETECTED Final   Neisseria meningitidis NOT DETECTED NOT DETECTED Final   Pseudomonas aeruginosa NOT DETECTED NOT DETECTED Final   Stenotrophomonas maltophilia NOT DETECTED NOT DETECTED Final   Candida albicans NOT DETECTED NOT DETECTED Final   Candida auris NOT DETECTED NOT DETECTED Final   Candida glabrata NOT DETECTED NOT DETECTED Final   Candida krusei NOT DETECTED NOT DETECTED Final   Candida parapsilosis NOT DETECTED NOT DETECTED Final   Candida tropicalis NOT DETECTED NOT DETECTED Final   Cryptococcus neoformans/gattii NOT DETECTED NOT DETECTED Final    Comment: Performed at [facility], [address], [place], [place] [address]  Culture, blood (Routine X 2) w Reflex to ID Panel     Status: None (Preliminary result)   Collection Time: 01/17/24  6:22 PM   Specimen: BLOOD RIGHT HAND  Result Value Ref Range Status   Specimen Description BLOOD RIGHT HAND  Final   Special Requests   Final    BOTTLES DRAWN AEROBIC AND ANAEROBIC Blood Culture results may  not be optimal due to an inadequate volume of blood received in culture bottles   Culture   Final    NO GROWTH 4 DAYS Performed at [facility], [address] [place], [place] [address]    Report Status PENDING  Incomplete  Urine Culture (for pregnant, neutropenic or urologic patients or patients with an indwelling urinary catheter)     Status:  Abnormal   Collection Time: 01/18/24  8:41 AM   Specimen: Urine, Clean Catch  Result Value Ref Range Status   Specimen Description URINE, CLEAN CATCH  Final   Special Requests   Final    NONE Performed at [facility], [address] [place], [place] [address]    Culture >=100,000 COLONIES/mL KLEBSIELLA PNEUMONIAE (A)  Final   Report Status 01/20/2024 FINAL  Final   Organism ID, Bacteria KLEBSIELLA PNEUMONIAE (A)  Final      Susceptibility   Klebsiella pneumoniae - MIC*    AMPICILLIN RESISTANT Resistant     CEFAZOLIN <=4 SENSITIVE Sensitive     CEFEPIME <=0.12 SENSITIVE Sensitive     CEFTRIAXONE <=0.25 SENSITIVE Sensitive     CIPROFLOXACIN <=0.25 SENSITIVE Sensitive     GENTAMICIN <=1 SENSITIVE Sensitive     IMIPENEM <=0.25 SENSITIVE Sensitive     NITROFURANTOIN 64 INTERMEDIATE Intermediate     TRIMETH/SULFA <=20 SENSITIVE Sensitive     AMPICILLIN/SULBACTAM 4 SENSITIVE Sensitive     PIP/TAZO <=4 SENSITIVE Sensitive ug/mL    * >=100,000 COLONIES/mL KLEBSIELLA PNEUMONIAE  Culture, blood (Routine X 2) w Reflex to ID Panel     Status: None (Preliminary result)   Collection Time: 01/18/24  3:43 PM   Specimen: BLOOD LEFT ARM  Result Value Ref Range Status   Specimen Description BLOOD LEFT ARM  Final   Special Requests   Final    BOTTLES DRAWN AEROBIC AND ANAEROBIC Blood Culture results may not be optimal due to an inadequate volume of blood received in culture bottles   Culture   Final    NO GROWTH 3 DAYS Performed at [facility], [address], [place], [place] [address]    Report Status PENDING  Incomplete  Culture, blood (Routine X 2) w Reflex to ID Panel     Status: None (Preliminary result)   Collection Time: 01/18/24  3:46 PM   Specimen: BLOOD LEFT HAND  Result Value Ref Range Status   Specimen Description BLOOD LEFT HAND  Final   Special Requests   Final    BOTTLES DRAWN AEROBIC AND ANAEROBIC Blood Culture results may not be optimal due to an inadequate  volume of blood received in culture bottles   Culture   Final    NO GROWTH 3 DAYS Performed at [facility], [address], [place], [place] [address]    Report Status PENDING  Incomplete    RADIOLOGY STUDIES/RESULTS: IR EXCHANGE BILIARY DRAIN Result Date: 01/20/2024 INDICATION: 84 year old male with history of chronic indwelling percutaneous cholecystostomy tube, currently admitted with bacteremia. Request for cholecystostomy tube exchange. EXAM: FLUOROSCOPIC GUIDED CHOLECYSTOSTOMY TUBE EXCHANGE COMPARISON:  12/19/2023 MEDICATIONS: None ANESTHESIA/SEDATION: None CONTRAST:  10mL OMNIPAQUE IOHEXOL 300 MG/ML SOLN - administered into the gallbladder lumen. FLUOROSCOPY TIME:  Nine mGy COMPLICATIONS: None immediate. PROCEDURE: The patient was positioned supine on the fluoroscopy table. The external portion of the existing cholecystostomy tube as well as the surrounding skin was prepped and draped in usual sterile fashion. A time-out was performed prior to the initiation of the  procedure. A preprocedural spot fluoroscopic image was obtained of the right upper abdominal quadrant existing cholecystostomy tube. The skin surrounding the cholecystostomy tube was anesthetized with 1% lidocaine with epinephrine. The external portion of the cholecystostomy tube was cut and cannulated with a short Amplatz wire which was advanced through the tube and coiled within the gallbladder lumen Next, under intermittent fluoroscopic guidance, the existing 12 French cholecystostomy tube was exchanged for a new 12 [place] cholecystostomy tube which was repositioned into the more central aspect of the gallbladder lumen. Contrast injection confirms appropriate positioning and functionality of the cholecystomy tube. The cholecystostomy tube was flushed with a small amount of saline and reconnected to a gravity bag. The cholecystostomy tube was secured with an interrupted suture and a Stat Lock device. A dressing was applied. The  patient tolerated the procedure well without immediate postprocedural complication. FINDINGS: Preprocedural spot fluoroscopic image demonstrates unchanged positioning of cholecystostomy tube with end coiled and locked over the expected location of the fundus of the gallbladder After fluoroscopic guided exchange, the new 12 French cholecystostomy tube is more ideally positioned with end coiled and locked within the central aspect of the gallbladder lumen. The cystic duct is patent. Post exchange cholangiogram demonstrates appropriate positioning and functionality of the new cholecystostomy tube. The cystic duct is patent. IMPRESSION: Successful fluoroscopic guided exchange of 12 French cholecystostomy tube. Patent appearing cystic duct. PLAN: Routine 8 week cholecystostomy tube check and exchange. Marliss Coots, MD [facility] Electronically Signed   By: Marliss Coots M.D.   On: 01/20/2024 10:30   ECHOCARDIOGRAM COMPLETE Result Date: 01/19/2024    ECHOCARDIOGRAM REPORT   Patient Name:   LESHUN STENGLEIN Date of Exam: 01/19/2024 Medical Rec #:  [id number]      Height:       65.0 in Accession #:    [id number]     Weight:       165.0 lb Date of Birth:  [birth year]     BSA:          1.823 m Patient Age:    55 years       BP:           124/70 mmHg Patient Gender: M              HR:           83 bpm. Exam Location:  Inpatient Procedure: 2D Echo, Cardiac Doppler, Color Doppler and Intracardiac            Opacification Agent Indications:    Bacteremia R78.81  History:        Patient has prior history of Echocardiogram examinations, most                 recent 08/16/2022. Stroke; Risk Factors:Hypertension, Diabetes                 and Dyslipidemia.  Sonographer:    Celesta Gentile RCS Referring Phys: [id number] [facility] M [facility]  Sonographer Comments: Technically difficult study due to poor echo windows. IMPRESSIONS  1. Left ventricular ejection fraction, by estimation, is 65 to  70%. The left ventricle has normal function. The left ventricle has no regional wall motion abnormalities. There is mild left ventricular hypertrophy. Indeterminate diastolic filling due to E-A fusion.  2. Right ventricular systolic function is hyperdynamic. The right ventricular size is normal.  3. Left atrial size was moderately dilated.  4. The mitral valve is normal in structure. Trivial mitral valve regurgitation.  No evidence of mitral stenosis.  5. The aortic valve is tricuspid. There is mild thickening of the aortic valve. Aortic valve regurgitation is not visualized. Aortic valve sclerosis is present, with no evidence of aortic valve stenosis.  6. The inferior vena cava is normal in size with greater than 50% respiratory variability, suggesting right atrial pressure of 3 mmHg. Comparison(s): No significant change from prior study. Prior images reviewed side by side. Conclusion(s)/Recommendation(s): No evidence of valvular vegetations on this transthoracic echocardiogram. Consider a transesophageal echocardiogram to exclude infective endocarditis if clinically indicated. FINDINGS  Left Ventricle: Left ventricular ejection fraction, by estimation, is 65 to 70%. The left ventricle has normal function. The left ventricle has no regional wall motion abnormalities. Definity contrast agent was given IV to delineate the left ventricular  endocardial borders. The left ventricular internal cavity size was normal in size. There is mild left ventricular hypertrophy. Indeterminate diastolic filling due to E-A fusion. Right Ventricle: The right ventricular size is normal. No increase in right ventricular wall thickness. Right ventricular systolic function is hyperdynamic. Left Atrium: Left atrial size was moderately dilated. Right Atrium: Right atrial size was normal in size. Pericardium: There is no evidence of pericardial effusion. Mitral Valve: The mitral valve is normal in structure. Trivial mitral valve  regurgitation. No evidence of mitral valve stenosis. Tricuspid Valve: The tricuspid valve is normal in structure. Tricuspid valve regurgitation is not demonstrated. No evidence of tricuspid stenosis. Aortic Valve: The aortic valve is tricuspid. There is mild thickening of the aortic valve. Aortic valve regurgitation is not visualized. Aortic valve sclerosis is present, with no evidence of aortic valve stenosis. Pulmonic Valve: The pulmonic valve was normal in structure. Pulmonic valve regurgitation is not visualized. No evidence of pulmonic stenosis. Aorta: The aortic root is normal in size and structure. Venous: The inferior vena cava is normal in size with greater than 50% respiratory variability, suggesting right atrial pressure of 3 mmHg. IAS/Shunts: No atrial level shunt detected by color flow Doppler.  LEFT VENTRICLE PLAX 2D LVIDd:         3.60 cm LVIDs:         1.90 cm LV PW:         1.10 cm LV IVS:        1.20 cm LVOT diam:     1.80 cm LV SV:         48 LV SV Index:   26 LVOT Area:     2.54 cm  RIGHT VENTRICLE TAPSE (M-mode): 2.4 cm LEFT ATRIUM             Index        RIGHT ATRIUM           Index LA diam:        3.40 cm 1.87 cm/m   RA Area:     20.40 cm LA Vol (A2C):   54.6 ml 29.95 ml/m  RA Volume:   62.80 ml  34.45 ml/m LA Vol (A4C):   88.6 ml 48.61 ml/m LA Biplane Vol: 74.8 ml 41.03 ml/m  AORTIC VALVE LVOT Vmax:   101.00 cm/s LVOT Vmean:  64.700 cm/s LVOT VTI:    0.188 m  AORTA Ao Root diam: 3.40 cm MITRAL VALVE MV Area (PHT): 6.11 cm     SHUNTS MV E velocity: 128.00 cm/s  Systemic VTI:  0.19 m  Systemic Diam: 1.80 cm Thurmon Fair MD Electronically signed by Thurmon Fair MD Signature Date/Time: 01/19/2024/2:49:23 PM    Final      LOS: 4 days   Jeoffrey Massed, MD  [facility]    To contact the attending provider between 7A-7P or the covering provider during after hours 7P-7A, please log into the web site [email or web address] and access using universal [facility] password for that web site. If you do not have the password, please call the hospital operator.  01/21/2024, 10:51 AM

## 2024-01-22 ENCOUNTER — Other Ambulatory Visit (HOSPITAL_COMMUNITY): Payer: Self-pay

## 2024-01-22 DIAGNOSIS — E1110 Type 2 diabetes mellitus with ketoacidosis without coma: Secondary | ICD-10-CM | POA: Diagnosis not present

## 2024-01-22 DIAGNOSIS — N179 Acute kidney failure, unspecified: Secondary | ICD-10-CM | POA: Diagnosis not present

## 2024-01-22 DIAGNOSIS — E440 Moderate protein-calorie malnutrition: Secondary | ICD-10-CM | POA: Diagnosis not present

## 2024-01-22 DIAGNOSIS — G9340 Encephalopathy, unspecified: Secondary | ICD-10-CM | POA: Diagnosis not present

## 2024-01-22 LAB — GLUCOSE, CAPILLARY
Glucose-Capillary: 93 mg/dL (ref 70–99)
Glucose-Capillary: 122 mg/dL — ABNORMAL HIGH (ref 70–99)
Glucose-Capillary: 196 mg/dL — ABNORMAL HIGH (ref 70–99)
Glucose-Capillary: 239 mg/dL — ABNORMAL HIGH (ref 70–99)

## 2024-01-22 LAB — CULTURE, BLOOD (ROUTINE X 2): Culture: NO GROWTH

## 2024-01-22 MED ORDER — INSULIN ASPART PROT & ASPART (70-30 MIX) 100 UNIT/ML ~~LOC~~ SUSP
20.0000 [IU] | Freq: Once | SUBCUTANEOUS | Status: AC
  Administered 2024-01-22: 20 [IU] via SUBCUTANEOUS

## 2024-01-22 MED ORDER — AMOXICILLIN 500 MG PO CAPS
1000.0000 mg | ORAL_CAPSULE | Freq: Three times a day (TID) | ORAL | 0 refills | Status: AC
  Filled 2024-01-22: qty 60, 10d supply, fill #0

## 2024-01-22 NOTE — Progress Notes (Signed)
Patient discharged to home via [facility]. 

## 2024-01-22 NOTE — TOC Transition Note (Addendum)
Transition of Care [facility]) - Discharge Note   Patient Details  Name: Andre Stout MRN: [id number] Date of Birth: 04-06-1940  Transition of Care [facility] CM/SW Contact:  Lawerance Sabal, RN Phone Number: 01/22/2024, 9:38 AM   Clinical Narrative:     Patient to DC to home today. On PO abx Notified [facility] liaison of DC Spoke w daughter and she will need PTAR, no documentation of patient being able to walk. Her address is [address] [place] [address] PTAR forms on chart and PTAR called for around 3pm.    Final next level of care: Home w Home Health Services Barriers to Discharge: No Barriers Identified   Patient Goals and CMS Choice Patient states their goals for this hospitalization and ongoing recovery are:: Return home CMS Medicare.gov Compare Post Acute Care list provided to:: Patient Represenative (must comment) Choice offered to / list presented to : Adult Children [place] ownership interest in [facility].provided to:: Adult Children    Discharge Placement                       Discharge Plan and Services Additional resources added to the After Visit Summary for   In-house Referral: Clinical Social Work   Post Acute Care Choice: Home Health, Skilled Nursing Facility                      [facility] Agency: [employer] [facility] Date [facility] Agency Contacted: 01/22/24 Time HH Agency Contacted: [phone] Representative spoke with at [facility] Agency: Elnita Maxwell  Social Drivers of Health (SDOH) Interventions SDOH Screenings   Food Insecurity: Patient Unable To Answer (01/19/2024)  Housing: Patient Unable To Answer (01/19/2024)  Transportation Needs: Patient Unable To Answer (01/19/2024)  Utilities: Patient Unable To Answer (01/19/2024)  Alcohol Screen: Low Risk  (07/24/2023)  Depression (PHQ2-9): Low Risk  (07/24/2023)  Financial Resource Strain: Low Risk  (07/24/2023)  Physical Activity: Inactive (07/24/2023)  Social Connections: Patient Unable To Answer  (01/19/2024)  Stress: No Stress Concern Present (07/24/2023)  Tobacco Use: Medium Risk (01/17/2024)  Health Literacy: Adequate Health Literacy (07/24/2023)     Readmission Risk Interventions    08/15/2022    9:25 AM  Readmission Risk Prevention Plan  Transportation Screening Complete  PCP or Specialist Appt within 5-7 Days Complete  Home Care Screening Complete  Medication Review (RN CM) Complete

## 2024-01-22 NOTE — Progress Notes (Signed)
Called to discuss discharge teaching with daughter Thomes Corpe  she said she is comfortable to drain care and to just place discharge packet in his bag and she will review ir when he gets home.

## 2024-01-22 NOTE — Progress Notes (Addendum)
Occupational Therapy Treatment Patient Details Name: Andre Stout MRN: [id number] DOB: 04-Nov-1940 Today's Date: 01/22/2024   History of present illness 84 yo male presents to ED 1/17 with AMS, DKA and streptococcal bacteremia, recent fall. Pt found to have fracture of distal diaphyseal metaphyseal junction of the 5th left metacarpal with dorsal angulation, ulnar gutter splint donned. CTH negative for acute findings. PMH: AV block, hypertension, type 2 diabetes, prostate cancer, glaucoma, HTNm, Cholecystitis s/p cholecystostomy in 07/2022.   OT comments  Pt making gradual progress towards OT goals though remains limited by quick fatigue with minimal tasks. Pt able to assist with EOB bathing task with Min A for UB ADLs and Max-Total A needed for LB ADLs. Pt able to stand briefly x 2 with RW at Mod A; reported need for continued BM (noted soilage on entry) but unable to stand long enough for [facility] at the time. Returned to bed and assisted onto bedpan. Noted family opting for [facility] therapies rather than inpatient stay; would recommend 24/7 hands on assist and to use wheelchair for main means of mobility to decrease fall risk.       If plan is discharge home, recommend the following:  Two people to help with walking and/or transfers;A lot of help with bathing/dressing/bathroom;Assistance with cooking/housework;Assistance with feeding;Direct supervision/assist for medications management;Direct supervision/assist for financial management;Assist for transportation;Help with stairs or ramp for entrance;Supervision due to cognitive status   Equipment Recommendations  Wheelchair (measurements OT);Wheelchair cushion (measurements OT) (if does not already have)    Recommendations for Other Services      Precautions / Restrictions Precautions Precautions: Fall;Other (comment) Precaution Comments: bowel urgency/incontinence, R sided drain Required Braces or Orthoses: Other Brace Other Brace: supposed to have L  ulnar gutter splint; not present on 1/22 Restrictions Weight Bearing Restrictions Per Provider Order: Yes LUE Weight Bearing Per Provider Order: Weight bear through elbow only Other Position/Activity Restrictions: assumed based on fracture location. pt able to grasp items and use hand functionally w/ occasional discomfort       Mobility Bed Mobility Overal bed mobility: Needs Assistance Bed Mobility: Sit to Supine, Supine to Sit, Rolling Rolling: Min assist, Used rails   Supine to sit: Min assist, HOB elevated, Used rails Sit to supine: Mod assist   General bed mobility comments: min A for trunk elevation, Mod A for BLE back to bed and light Min A to roll to L side for bedpan placement    Transfers Overall transfer level: Needs assistance Equipment used: Rolling walker (2 wheels) Transfers: Sit to/from Stand Sit to Stand: Mod assist           General transfer comment: Mod A to stand at bedside x 2 w/ rest breaks needed after brief standing < 5 seconds. First attempt for initaiting peri care, second attempt to take steps at bedside and complete peri care but pt unable to stand long enough for peri care.     Balance Overall balance assessment: Needs assistance, History of Falls Sitting-balance support: Feet supported, Single extremity supported Sitting balance-Leahy Scale: Fair     Standing balance support: Bilateral upper extremity supported, During functional activity Standing balance-Leahy Scale: Poor                             ADL either performed or assessed with clinical judgement   ADL Overall ADL's : Needs assistance/impaired     Grooming: Set up;Sitting;Wash/dry face;Wash/dry hands   Upper Body Bathing: Minimal  assistance;Sitting Upper Body Bathing Details (indicate cue type and reason): assistance to bathe back while pt able to bathe abdomen and under arms EOB Lower Body Bathing: Maximal assistance;Sit to/from stand;Sitting/lateral leans Lower  Body Bathing Details (indicate cue type and reason): able to assist bathing upper LE but assistance with lower LE and initial assist for posterior region in standing until need for BM again. Upper Body Dressing : Minimal assistance;Sitting           Toileting- Clothing Manipulation and Hygiene: Total assistance;Bed level Toileting - Clothing Manipulation Details (indicate cue type and reason): assisted onto bed pan per pt request to have BM. Based on pt fatigue levels and unable to tolerate standing > 3-4 seconds, opted for bedpan instead of BSC            Extremity/Trunk Assessment Upper Extremity Assessment Upper Extremity Assessment: Right hand dominant;LUE deficits/detail LUE Deficits / Details: distal diaphyseal metaphyseal junction of the 5th left metacarpal with dorsal angulation, ulnar gutter splint ordered but was not donned on OT entry and unable to locate. Pt with minor swollen digits, able to make light fist to grasp ADL items (washcloth).   Lower Extremity Assessment Lower Extremity Assessment: Defer to PT evaluation        Vision   Vision Assessment?: No apparent visual deficits   Perception     Praxis      Cognition Arousal: Alert Behavior During Therapy: Flat affect Overall Cognitive Status: Impaired/Different from baseline Area of Impairment: Attention, Safety/judgement, Awareness, Problem solving, Memory                   Current Attention Level: Sustained Memory: Decreased short-term memory   Safety/Judgement: Decreased awareness of safety, Decreased awareness of deficits Awareness: Intellectual, Emergent Problem Solving: Slow processing, Decreased initiation, Difficulty sequencing, Requires verbal cues General Comments: pleasant, following directions well w/ increased volume due to [facility] minor confusion noted when discussing typical routine and needs. bowel incontinence noted bed level but upon standing, pt able to indicate need to use bathroom  further        Exercises      Shoulder Instructions       General Comments NT in to check BG and vitals; present when pt placed on bedpan; discussed ADLs assessed and need for sheet change after bedpan use    Pertinent Vitals/ Pain       Pain Assessment Pain Assessment: Faces Faces Pain Scale: Hurts little more Pain Location: L hand with grasping items; was using this UE to push self forward on bed Pain Descriptors / Indicators: Grimacing, Guarding Pain Intervention(s): Monitored during session, Limited activity within patient's tolerance  Home Living                                          Prior Functioning/Environment              Frequency  Min 1X/week        Progress Toward Goals  OT Goals(current goals can now be found in the care plan section)  Progress towards OT goals: OT to reassess next treatment  Acute Rehab OT Goals Patient Stated Goal: go home OT Goal Formulation: With patient Time For Goal Achievement: 02/02/24 Potential to Achieve Goals: Good ADL Goals Pt Will Perform Eating: with supervision;sitting Pt Will Perform Grooming: with supervision;sitting Pt Will Perform Upper Body Bathing: with min assist;sitting Pt  Will Perform Upper Body Dressing: with min assist;sitting Pt Will Transfer to Toilet: with min assist;stand pivot transfer;bedside commode  Plan      Co-evaluation                 AM-PAC OT "6 Clicks" Daily Activity     Outcome Measure   Help from another person eating meals?: A Little Help from another person taking care of personal grooming?: A Little Help from another person toileting, which includes using toliet, bedpan, or urinal?: Total Help from another person bathing (including washing, rinsing, drying)?: A Lot Help from another person to put on and taking off regular upper body clothing?: A Little Help from another person to put on and taking off regular lower body clothing?: Total 6 Click  Score: 13    End of Session Equipment Utilized During Treatment: Rolling walker (2 wheels)  OT Visit Diagnosis: Ataxia, unspecified (R27.0);Other (comment);Other symptoms and signs involving cognitive function;Pain   Activity Tolerance Patient limited by fatigue   Patient Left in bed;with call bell/phone within reach;with bed alarm set;with nursing/sitter in room   Nurse Communication Mobility status        Time: 1116-1140 OT Time Calculation (min): 24 min  Charges: OT General Charges $OT Visit: 1 Visit OT Treatments $Self Care/Home Management : 23-37 mins  Bradd Canary, OTR/L Acute Rehab Services Office: [phone]   Lorre Munroe 01/22/2024, 12:21 PM

## 2024-01-22 NOTE — Discharge Summary (Signed)
PATIENT DETAILS Name: Andre Stout Age: 84 y.o. Sex: male Date of Birth: Feb 09, 1940 MRN: [id number]. Admitting Physician: Orland Mustard, MD YQM:VHQION, Allyne Gee, FNP  Admit Date: 01/17/2024 Discharge date: 01/22/2024  Recommendations for Outpatient Follow-up:  Follow up with PCP in 1-2 weeks Please obtain CMP/CBC in one week Please ensure follow-up with orthopedics, infectious disease and endocrinology. Note-lisinopril on hold-BP stable with amlodipine-resume lisinopril when able.  Admitted From:  Home  Disposition: Home   Discharge Condition: good  CODE STATUS:   Code Status: Full Code   Diet recommendation:  Diet Order             Diet - low sodium heart healthy           Diet Carb Modified           DIET DYS 3 Room service appropriate? Yes with Assist; Fluid consistency: Thin  Diet effective now                    Brief Summary: Patient is a 84 y.o.  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   Significant events: 1/17>> admit to [facility]   Significant studies: 1/17>> x-ray left hand: Fracture at the distal diaphyseal/metaphyseal junction of fifth metacarpal 1/17>> CXR: No PNA 1/17>> CT head: No acute finding 1/17>> CT C-spine: No acute finding. 1/19>> echo: EF 65-70%   Significant microbiology data: 1/17>> blood culture 1/2: Group B strep, staph epidermidis 1/18>> blood culture: Pending 1/18>> urine culture: Klebsiella pneumoniae   Procedures: 1/20>> cholecystostomy tube exchange by IR   Consults: Infectious disease  Brief Hospital Course: DKA Resolved with IV insulin/IVF Likely triggered by possible bacteremia   DM-2 with uncontrolled hyperglycemia/DKA (A1c 10.07 November 2023) CBGs was difficult to control-he was initially  on Semglee/NovoLog-but was switched back to to his usual home regimen of 70/30 with better glycemic control. Follow-up with PCP/primary endocrinology for further optimization.    Acute metabolic encephalopathy Likely due to DKA/bacteremia Overall better-much more awake and alert-suspect back to baseline. CT imaging negative Maintain delirium precautions   Streptococcal bacteremia Afebrile-leukocytosis downtrending Evaluated by ID-now on penicillin G Repeat cultures negative-transthoracic echo without any obvious vegetation.   ID commending amoxicillin on discharge-end date 02/01/2024.   Asymptomatic bacteriuria No symptoms of UTI-nevertheless-already on antibiotic that should cover.   History of cholecystitis-s/p cholecystostomy tube in place IR performed cholecystostomy tube exchange 1/20   History of intermittent complete heart block Telemetry monitoring Avoid AV nodal blocking agents Per prior notes-is being monitored closely by EP/cardiology   History of CVA Appears to be moving all 4 extremities Aspirin/Plavix/statin   HTN BP stable Continue amlodipine-will continue to hold lisinopril until seen by PCP-as blood pressure seems to be controlled with just amlodipine.   Hypokalemia Repleted   AKI Likely hemodynamically mediated Resolved with supportive care   Hypomagnesemia Repleted   Fracture of distal middle Faisal diaphyseal junction-left fifth metacarpal Per prior notes-Ortho consulted-ulnar splint-Dr. Yehuda Budd recommended follow-up in 1 week.   Nutrition Status: Nutrition Problem: Moderate Malnutrition Etiology: acute illness Signs/Symptoms: mild fat depletion, mild muscle depletion, energy intake < or equal to 50% for > or equal to 5 days Interventions: Ensure Enlive (each supplement provides 350kcal and 20 grams of protein), Hormel Shake, MVI, Liberalize Diet   BMI: Estimated body mass index is 27.46 kg/m as calculated from the  following:   Height as of  this encounter: 5\' 5"  (1.651 m).   Weight as of this encounter: 74.8 kg.    Discharge Diagnoses:  Principal Problem:   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 (HCC) Active Problems:   Heart block AV complete (HCC)   Acute encephalopathy   AKI (acute kidney injury) (HCC)   fracture of distal diaphyseal metaphyseal junction of the 5th left metacarpal with dorsal angulation   Leukocytosis   Cholecystostomy care (HCC)   Hyponatremia   Benign essential HTN   History of CVA (cerebrovascular accident)   Hyperlipidemia   Malnutrition of moderate degree   Discharge Instructions:  Activity:  As tolerated with Full fall precautions use walker/cane & assistance as needed  Discharge Instructions     Call MD for:  difficulty breathing, headache or visual disturbances   Complete by: As directed    Call MD for:  persistant nausea and vomiting   Complete by: As directed    Call MD for:  severe uncontrolled pain   Complete by: As directed    Diet - low sodium heart healthy   Complete by: As directed    Diet Carb Modified   Complete by: As directed    Increase activity slowly   Complete by: As directed    No wound care   Complete by: As directed       Allergies as of 01/22/2024   No Known Allergies      Medication List     STOP taking these medications    lisinopril 20 MG tablet Commonly known as: ZESTRIL       TAKE these medications    Accu-Chek Guide w/Device Kit Use As Directed   acetaminophen 500 MG tablet Commonly known as: TYLENOL Take 1,000 mg by mouth daily as needed (pain).   amLODipine 10 MG tablet Commonly known as: NORVASC Take 1 tablet (10 mg total) by mouth daily.   amoxicillin 500 MG capsule Commonly known as: AMOXIL Take 2 capsules (1,000 mg total) by mouth 3 (three) times daily for 10 days.   aspirin 81 MG chewable tablet Chew 81 mg by mouth daily.   atorvastatin 40 MG tablet Commonly known as: LIPITOR Take 1  tablet (40 mg total) by mouth daily.   Azelastine HCl 137 MCG/SPRAY Soln PLACE 1 SPRAY INTO BOTH NOSTRILS 2 (TWO) TIMES DAILY. USE IN EACH NOSTRIL AS DIRECTED   BD PosiFlush 0.9 % Soln injection Generic drug: sodium chloride flush Inject 5 mLs into the vein as needed for up to 30 doses. Discard remainder of syringe.   benzonatate 100 MG capsule Commonly known as: TESSALON Take 1 capsule (100 mg total) by mouth 3 (three) times daily as needed for cough.   brimonidine 0.2 % ophthalmic solution Commonly known as: ALPHAGAN Place 1 drop into both eyes 2 (two) times daily with breakfast and lunch.   clopidogrel 75 MG tablet Commonly known as: PLAVIX Take 1 tablet (75 mg total) by mouth daily.   dorzolamide-timolol 2-0.5 % ophthalmic solution Commonly known as: COSOPT Place 1 drop into both eyes 2 (two) times daily with breakfast and lunch.   FreeStyle Libre 3 Reader Friant 1 each by Does not apply route daily.   FreeStyle Libre 3 Sensor Misc 1 each by Does not apply route every 14 (fourteen) days.   Insulin Pen Needle 32G X 4 MM Misc Use to inject insulin 3 times a day   Insulin Syringe-Needle U-100 31G X 5/16" 1 ML  Misc Commonly known as: BD Insulin Syringe U/F USE 2 DAILY   latanoprost 0.005 % ophthalmic solution Commonly known as: XALATAN Place 1 drop into both eyes at bedtime.   magnesium oxide 400 (240 Mg) MG tablet Commonly known as: MAG-OX Take 1 tablet (400 mg total) by mouth every evening.   NovoLOG 70/30 FlexPen (70-30) 100 UNIT/ML FlexPen Generic drug: insulin aspart protamine - aspart 30 units before b'fast 20 units before lunch 15 units before dinner Take the insulin 10-15 min before meals.   ondansetron 4 MG tablet Commonly known as: ZOFRAN Take 4 mg by mouth every 8 (eight) hours as needed.   OneTouch Delica Lancets 33G Misc Use to check blood sugar 4 times per day. Dx code: E11.9   OneTouch Ultra test strip Generic drug: glucose blood USE TO MONITOR  GLUCOSE LEVELS 4 TIMES PER DAY E11.9   pantoprazole 40 MG tablet Commonly known as: PROTONIX Take 1 tablet (40 mg total) by mouth 2 (two) times daily before a meal.   PreserVision AREDS 2 Caps Take 1 capsule by mouth at bedtime.        Follow-up Information     Comer, Belia Heman, MD Follow up on 02/10/2024.   Specialty: Infectious Diseases Why: 2:45 PM - please arrive 15 minutes early, Hospital Discharge Follow Up Contact information: [address] [place] [address] [phone]         Olive Bass, FNP. Schedule an appointment as soon as possible for a visit in 1 week(s).   Specialty: Internal Medicine Contact information: [address] [place] [address] [phone]         Gomez Cleverly, MD. Schedule an appointment as soon as possible for a visit in 1 week(s).   Specialty: Orthopedic Surgery Contact information: [address] [place] [address] [phone]                No Known Allergies   Other Procedures/Studies: IR EXCHANGE BILIARY DRAIN Result Date: 01/20/2024 INDICATION: 84 year old male with history of chronic indwelling percutaneous cholecystostomy tube, currently admitted with bacteremia. Request for cholecystostomy tube exchange. EXAM: FLUOROSCOPIC GUIDED CHOLECYSTOSTOMY TUBE EXCHANGE COMPARISON:  12/19/2023 MEDICATIONS: None ANESTHESIA/SEDATION: None CONTRAST:  10mL OMNIPAQUE IOHEXOL 300 MG/ML SOLN - administered into the gallbladder lumen. FLUOROSCOPY TIME:  Nine mGy COMPLICATIONS: None immediate. PROCEDURE: The patient was positioned supine on the fluoroscopy table. The external portion of the existing cholecystostomy tube as well as the surrounding skin was prepped and draped in usual sterile fashion. A time-out was performed prior to the initiation of the procedure. A preprocedural spot fluoroscopic image was obtained of the right upper abdominal quadrant existing  cholecystostomy tube. The skin surrounding the cholecystostomy tube was anesthetized with 1% lidocaine with epinephrine. The external portion of the cholecystostomy tube was cut and cannulated with a short Amplatz wire which was advanced through the tube and coiled within the gallbladder lumen Next, under intermittent fluoroscopic guidance, the existing 12 French cholecystostomy tube was exchanged for a new 12 [place] cholecystostomy tube which was repositioned into the more central aspect of the gallbladder lumen. Contrast injection confirms appropriate positioning and functionality of the cholecystomy tube. The cholecystostomy tube was flushed with a small amount of saline and reconnected to a gravity bag. The cholecystostomy tube was secured with an interrupted suture and a Stat Lock device. A dressing was applied. The patient tolerated the procedure well without immediate postprocedural complication. FINDINGS: Preprocedural spot fluoroscopic image demonstrates unchanged  positioning of cholecystostomy tube with end coiled and locked over the expected location of the fundus of the gallbladder After fluoroscopic guided exchange, the new 12 [place] cholecystostomy tube is more ideally positioned with end coiled and locked within the central aspect of the gallbladder lumen. The cystic duct is patent. Post exchange cholangiogram demonstrates appropriate positioning and functionality of the new cholecystostomy tube. The cystic duct is patent. IMPRESSION: Successful fluoroscopic guided exchange of 12 French cholecystostomy tube. Patent appearing cystic duct. PLAN: Routine 8 week cholecystostomy tube check and exchange. Marliss Coots, MD [facility] Radiology Electronically Signed   By: Marliss Coots M.D.   On: 01/20/2024 10:30   ECHOCARDIOGRAM COMPLETE Result Date: 01/19/2024    ECHOCARDIOGRAM REPORT   Patient Name:   MIRO MARSMAN Date of Exam: 01/19/2024 Medical Rec #:   [id number]      Height:       65.0 in Accession #:    [id number]     Weight:       165.0 lb Date of Birth:  18-Aug-1940     BSA:          1.823 m Patient Age:    89 years       BP:           124/70 mmHg Patient Gender: M              HR:           83 bpm. Exam Location:  Inpatient Procedure: 2D Echo, Cardiac Doppler, Color Doppler and Intracardiac            Opacification Agent Indications:    Bacteremia R78.81  History:        Patient has prior history of Echocardiogram examinations, most                 recent 08/16/2022. Stroke; Risk Factors:Hypertension, Diabetes                 and Dyslipidemia.  Sonographer:    Celesta Gentile RCS Referring Phys: [id number] [facility]  Sonographer Comments: Technically difficult study due to poor echo windows. IMPRESSIONS  1. Left ventricular ejection fraction, by estimation, is 65 to 70%. The left ventricle has normal function. The left ventricle has no regional wall motion abnormalities. There is mild left ventricular hypertrophy. Indeterminate diastolic filling due to E-A fusion.  2. Right ventricular systolic function is hyperdynamic. The right ventricular size is normal.  3. Left atrial size was moderately dilated.  4. The mitral valve is normal in structure. Trivial mitral valve regurgitation. No evidence of mitral stenosis.  5. The aortic valve is tricuspid. There is mild thickening of the aortic valve. Aortic valve regurgitation is not visualized. Aortic valve sclerosis is present, with no evidence of aortic valve stenosis.  6. The inferior vena cava is normal in size with greater than 50% respiratory variability, suggesting right atrial pressure of 3 mmHg. Comparison(s): No significant change from prior study. Prior images reviewed side by side. Conclusion(s)/Recommendation(s): No evidence of valvular vegetations on this transthoracic echocardiogram. Consider a transesophageal echocardiogram to exclude infective endocarditis if clinically indicated. FINDINGS  Left  Ventricle: Left ventricular ejection fraction, by estimation, is 65 to 70%. The left ventricle has normal function. The left ventricle has no regional wall motion abnormalities. Definity contrast agent was given IV to delineate the left ventricular  endocardial borders. The left ventricular internal cavity size was normal in size. There is mild left ventricular  hypertrophy. Indeterminate diastolic filling due to E-A fusion. Right Ventricle: The right ventricular size is normal. No increase in right ventricular wall thickness. Right ventricular systolic function is hyperdynamic. Left Atrium: Left atrial size was moderately dilated. Right Atrium: Right atrial size was normal in size. Pericardium: There is no evidence of pericardial effusion. Mitral Valve: The mitral valve is normal in structure. Trivial mitral valve regurgitation. No evidence of mitral valve stenosis. Tricuspid Valve: The tricuspid valve is normal in structure. Tricuspid valve regurgitation is not demonstrated. No evidence of tricuspid stenosis. Aortic Valve: The aortic valve is tricuspid. There is mild thickening of the aortic valve. Aortic valve regurgitation is not visualized. Aortic valve sclerosis is present, with no evidence of aortic valve stenosis. Pulmonic Valve: The pulmonic valve was normal in structure. Pulmonic valve regurgitation is not visualized. No evidence of pulmonic stenosis. Aorta: The aortic root is normal in size and structure. Venous: The inferior vena cava is normal in size with greater than 50% respiratory variability, suggesting right atrial pressure of 3 mmHg. IAS/Shunts: No atrial level shunt detected by color flow Doppler.  LEFT VENTRICLE PLAX 2D LVIDd:         3.60 cm LVIDs:         1.90 cm LV PW:         1.10 cm LV IVS:        1.20 cm LVOT diam:     1.80 cm LV SV:         48 LV SV Index:   26 LVOT Area:     2.54 cm  RIGHT VENTRICLE TAPSE (M-mode): 2.4 cm LEFT ATRIUM             Index        RIGHT ATRIUM           Index  LA diam:        3.40 cm 1.87 cm/m   RA Area:     20.40 cm LA Vol (A2C):   54.6 ml 29.95 ml/m  RA Volume:   62.80 ml  34.45 ml/m LA Vol (A4C):   88.6 ml 48.61 ml/m LA Biplane Vol: 74.8 ml 41.03 ml/m  AORTIC VALVE LVOT Vmax:   101.00 cm/s LVOT Vmean:  64.700 cm/s LVOT VTI:    0.188 m  AORTA Ao Root diam: 3.40 cm MITRAL VALVE MV Area (PHT): 6.11 cm     SHUNTS MV E velocity: 128.00 cm/s  Systemic VTI:  0.19 m                             Systemic Diam: 1.80 cm Rachelle Hora Croitoru MD Electronically signed by Thurmon Fair MD Signature Date/Time: 01/19/2024/2:49:23 PM    Final    DG Abd 1 View Result Date: 01/18/2024 CLINICAL DATA:  Cholecystostomy tube dysfunction EXAM: ABDOMEN - 1 VIEW COMPARISON:  08/18/2023 abdominal CT 12/19/2023 fluoroscopy FINDINGS: Cholecystostomy tube overlaps the right upper quadrant, near the inferior liver margin shadow, stable from prior imaging. No dilated bowel or concerning mass effect/calcification. Prostate brachytherapy seeds. Extensive artifact from support hardware. IMPRESSION: Similar positioning of cholecystostomy tube when compared to fluoroscopy 12/19/2023 Electronically Signed   By: Tiburcio Pea M.D.   On: 01/18/2024 08:50   CT Cervical Spine Wo Contrast Result Date: 01/17/2024 CLINICAL DATA:  Fall.  Trauma to the head and neck. EXAM: CT CERVICAL SPINE WITHOUT CONTRAST TECHNIQUE: Multidetector CT imaging of the cervical spine was performed without intravenous contrast. Multiplanar CT  image reconstructions were also generated. RADIATION DOSE REDUCTION: This exam was performed according to the departmental dose-optimization program which includes automated exposure control, adjustment of the mA and/or kV according to patient size and/or use of iterative reconstruction technique. COMPARISON:  None Available. FINDINGS: Alignment: No traumatic malalignment. Somewhat exaggerated cervical lordosis but without listhesis. Skull base and vertebrae: No regional fracture or focal  bone lesion. 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 No significant regional facet arthropathy. Upper chest: Negative Other: None IMPRESSION: No acute or traumatic finding. 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 Electronically Signed   By: Paulina Fusi M.D.   On: 01/17/2024 14:53   DG Chest Portable 1 View Result Date: 01/17/2024 CLINICAL DATA:  Fall EXAM: PORTABLE CHEST 1 VIEW COMPARISON:  07/31/2022 FINDINGS: Heart size upper limits of normal. Ordinary thoracic aortic atherosclerosis. The lungs are clear. No infiltrate, collapse or effusion. No pneumothorax. No acute regional bone finding. IMPRESSION: No active disease. Aortic atherosclerosis. Electronically Signed   By: Paulina Fusi M.D.   On: 01/17/2024 14:52   DG Hand Complete Left Result Date: 01/17/2024 CLINICAL DATA:  Fall with trauma to the and. EXAM: LEFT HAND - COMPLETE 3+ VIEW COMPARISON:  None FINDINGS: Fracture at the distal diaphyseal metaphyseal junction of the fifth metacarpal with dorsal angulation. Boxer's fracture configuration. No other regional acute injury. 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 IMPRESSION: Fracture at the distal diaphyseal metaphyseal junction of the fifth metacarpal with dorsal angulation. Electronically Signed   By: Paulina Fusi M.D.   On: 01/17/2024 14:51   CT Head Wo Contrast Result Date: 01/17/2024 CLINICAL DATA:  Fall with trauma to the head and neck EXAM: CT HEAD WITHOUT CONTRAST TECHNIQUE: Contiguous axial images were obtained from the base of the skull through the vertex without intravenous contrast. RADIATION DOSE REDUCTION: This exam was performed according to the departmental dose-optimization program which includes automated exposure control, adjustment of the mA and/or kV according to patient size and/or use of iterative  reconstruction technique. COMPARISON:  10/16/2022 FINDINGS: Brain: No acute or traumatic finding. Generalized volume loss. Chronic small-vessel ischemic changes of the white matter. Old small vessel infarctions of the basal ganglia and thalami. No mass, hemorrhage, hydrocephalus or extra-axial collection. Vascular: There is atherosclerotic calcification of the major vessels at the base of the brain. Skull: Negative Sinuses/Orbits: Paranasal sinuses show mild seasonal mucosal thickening but no advanced finding. Extensive mastoid effusion on the left. Orbits negative. Other: None IMPRESSION: 1. No acute or traumatic finding. Generalized volume loss. Chronic small-vessel ischemic changes of the white matter. Old small vessel infarctions of the basal ganglia and thalami. 2. Extensive mastoid effusion on the left. Electronically Signed   By: Paulina Fusi M.D.   On: 01/17/2024 14:50     TODAY-DAY OF DISCHARGE:  Subjective:   Andre Stout today has no headache,no chest abdominal pain,no new weakness tingling or numbness, feels much better wants to go home today.   Objective:   Blood pressure 132/63, pulse 63, temperature 98.8 F (37.1 C), temperature source Oral, resp. rate 16, height 5\' 5"  (1.651 m), weight 74.8 kg, SpO2 95%.  Intake/Output Summary (Last 24 hours) at 01/22/2024 0926 Last data filed at 01/21/2024 1836 Gross per 24 hour  Intake 485 ml  Output 1070 ml  Net -585 ml   Filed Weights   01/17/24 1316  Weight: 74.8  kg    Exam: Awake Alert, Oriented *3, No new F.N deficits, Normal affect [place].AT,PERRAL Supple Neck,No JVD, No cervical lymphadenopathy appriciated.  Symmetrical Chest wall movement, Good air movement bilaterally, CTAB RRR,No Gallops,Rubs or new Murmurs, No Parasternal Heave +ve B.Sounds, Abd Soft, Non tender, No organomegaly appriciated, No rebound -guarding or rigidity. No Cyanosis, Clubbing or edema, No new Rash or bruise   PERTINENT RADIOLOGIC STUDIES: IR EXCHANGE  BILIARY DRAIN Result Date: 01/20/2024 INDICATION: 84 year old male with history of chronic indwelling percutaneous cholecystostomy tube, currently admitted with bacteremia. Request for cholecystostomy tube exchange. EXAM: FLUOROSCOPIC GUIDED CHOLECYSTOSTOMY TUBE EXCHANGE COMPARISON:  12/19/2023 MEDICATIONS: None ANESTHESIA/SEDATION: None CONTRAST:  10mL OMNIPAQUE IOHEXOL 300 MG/ML SOLN - administered into the gallbladder lumen. FLUOROSCOPY TIME:  Nine mGy COMPLICATIONS: None immediate. PROCEDURE: The patient was positioned supine on the fluoroscopy table. The external portion of the existing cholecystostomy tube as well as the surrounding skin was prepped and draped in usual sterile fashion. A time-out was performed prior to the initiation of the procedure. A preprocedural spot fluoroscopic image was obtained of the right upper abdominal quadrant existing cholecystostomy tube. The skin surrounding the cholecystostomy tube was anesthetized with 1% lidocaine with epinephrine. The external portion of the cholecystostomy tube was cut and cannulated with a short Amplatz wire which was advanced through the tube and coiled within the gallbladder lumen Next, under intermittent fluoroscopic guidance, the existing 12 French cholecystostomy tube was exchanged for a new 12 [place] cholecystostomy tube which was repositioned into the more central aspect of the gallbladder lumen. Contrast injection confirms appropriate positioning and functionality of the cholecystomy tube. The cholecystostomy tube was flushed with a small amount of saline and reconnected to a gravity bag. The cholecystostomy tube was secured with an interrupted suture and a Stat Lock device. A dressing was applied. The patient tolerated the procedure well without immediate postprocedural complication. FINDINGS: Preprocedural spot fluoroscopic image demonstrates unchanged positioning of cholecystostomy tube with end coiled and locked over the expected location of  the fundus of the gallbladder After fluoroscopic guided exchange, the new 12 French cholecystostomy tube is more ideally positioned with end coiled and locked within the central aspect of the gallbladder lumen. The cystic duct is patent. Post exchange cholangiogram demonstrates appropriate positioning and functionality of the new cholecystostomy tube. The cystic duct is patent. IMPRESSION: Successful fluoroscopic guided exchange of 12 French cholecystostomy tube. Patent appearing cystic duct. PLAN: Routine 8 week cholecystostomy tube check and exchange. Marliss Coots, MD [facility] Radiology Electronically Signed   By: Marliss Coots M.D.   On: 01/20/2024 10:30     PERTINENT LAB RESULTS: CBC: Recent Labs    01/20/24 0335 01/21/24 0414  WBC 16.5* 14.8*  HGB 11.6* 12.2*  HCT 35.8* 37.2*  PLT 517* 488*   CMET CMP     Component Value Date/Time   NA 137 01/21/2024 0414   K 3.7 01/21/2024 0414   CL 102 01/21/2024 0414   CO2 25 01/21/2024 0414   GLUCOSE 62 (L) 01/21/2024 0414   BUN 17 01/21/2024 0414   CREATININE 0.98 01/21/2024 0414   CREATININE 1.11 01/12/2022 1541   CALCIUM 8.9 01/21/2024 0414   PROT 7.5 01/17/2024 1342   ALBUMIN 2.8 (L) 01/17/2024 1342   AST 18 01/17/2024 1342   ALT 13 01/17/2024 1342   ALKPHOS 106 01/17/2024 1342   BILITOT 2.1 (H) 01/17/2024 1342   GFR 75.87 08/30/2022 1204   GFRNONAA >60 01/21/2024 0414    GFR Estimated Creatinine  Clearance: 54 mL/min (by C-G formula based on SCr of 0.98 mg/dL). No results for input(s): "LIPASE", "AMYLASE" in the last 72 hours. No results for input(s): "CKTOTAL", "CKMB", "CKMBINDEX", "TROPONINI" in the last 72 hours. Invalid input(s): "POCBNP" No results for input(s): "DDIMER" in the last 72 hours. No results for input(s): "HGBA1C" in the last 72 hours. No results for input(s): "CHOL", "HDL", "LDLCALC", "TRIG", "CHOLHDL", "LDLDIRECT" in the last 72 hours. No results for  input(s): "TSH", "T4TOTAL", "T3FREE", "THYROIDAB" in the last 72 hours.  Invalid input(s): "FREET3" No results for input(s): "VITAMINB12", "FOLATE", "FERRITIN", "TIBC", "IRON", "RETICCTPCT" in the last 72 hours. Coags: No results for input(s): "INR" in the last 72 hours.  Invalid input(s): "PT" Microbiology: Recent Results (from the past 240 hours)  Culture, blood (Routine X 2) w Reflex to ID Panel     Status: Abnormal   Collection Time: 01/17/24  5:44 PM   Specimen: BLOOD RIGHT ARM  Result Value Ref Range Status   Specimen Description BLOOD RIGHT ARM  Final   Special Requests   Final    BOTTLES DRAWN AEROBIC AND ANAEROBIC Blood Culture results may not be optimal due to an inadequate volume of blood received in culture bottles   Culture  Setup Time   Final    GRAM POSITIVE COCCI IN CHAINS IN BOTH AEROBIC AND ANAEROBIC BOTTLES CRITICAL RESULT CALLED TO, READ BACK BY AND VERIFIED WITH: PHARMD J. MILLEN [id number] AT 1055, ADC    Culture (A)  Final    GROUP B STREP(S.AGALACTIAE)ISOLATED STAPHYLOCOCCUS EPIDERMIDIS THE SIGNIFICANCE OF ISOLATING THIS ORGANISM FROM A SINGLE SET OF BLOOD CULTURES WHEN MULTIPLE SETS ARE DRAWN IS UNCERTAIN. PLEASE NOTIFY THE MICROBIOLOGY DEPARTMENT WITHIN ONE WEEK IF SPECIATION AND SENSITIVITIES ARE REQUIRED. Performed at [facility], [address] [place], [place] [address]    Report Status 01/20/2024 FINAL  Final   Organism ID, Bacteria GROUP B STREP(S.AGALACTIAE)ISOLATED  Final      Susceptibility   Group b strep(s.agalactiae)isolated - MIC*    CLINDAMYCIN >=1 RESISTANT Resistant     AMPICILLIN <=0.25 SENSITIVE Sensitive     ERYTHROMYCIN >=8 RESISTANT Resistant     VANCOMYCIN 0.5 SENSITIVE Sensitive     CEFTRIAXONE <=0.12 SENSITIVE Sensitive     LEVOFLOXACIN 0.5 SENSITIVE Sensitive     PENICILLIN <=0.06 SENSITIVE Sensitive     * GROUP B STREP(S.AGALACTIAE)ISOLATED  Blood Culture ID Panel (Reflexed)     Status: Abnormal   Collection Time:  01/17/24  5:44 PM  Result Value Ref Range Status   Enterococcus faecalis NOT DETECTED NOT DETECTED Final   Enterococcus Faecium NOT DETECTED NOT DETECTED Final   Listeria monocytogenes NOT DETECTED NOT DETECTED Final   Staphylococcus species NOT DETECTED NOT DETECTED Final   Staphylococcus aureus (BCID) NOT DETECTED NOT DETECTED Final   Staphylococcus epidermidis NOT DETECTED NOT DETECTED Final   Staphylococcus lugdunensis NOT DETECTED NOT DETECTED Final   Streptococcus species DETECTED (A) NOT DETECTED Final    Comment: CRITICAL RESULT CALLED TO, READ BACK BY AND VERIFIED WITH: PHARMD J. MILLEN [id number] AT 1055, ADC    Streptococcus agalactiae DETECTED (A) NOT DETECTED Final    Comment: CRITICAL RESULT CALLED TO, READ BACK BY AND VERIFIED WITH: PHARMD J. MILLEN [id number] AT 1055, ADC    Streptococcus pneumoniae NOT DETECTED NOT DETECTED Final   Streptococcus pyogenes NOT DETECTED NOT DETECTED Final   A.calcoaceticus-baumannii NOT DETECTED NOT DETECTED Final   Bacteroides fragilis NOT DETECTED NOT DETECTED Final   Enterobacterales NOT DETECTED NOT  DETECTED Final   Enterobacter cloacae complex NOT DETECTED NOT DETECTED Final   Escherichia coli NOT DETECTED NOT DETECTED Final   Klebsiella aerogenes NOT DETECTED NOT DETECTED Final   Klebsiella oxytoca NOT DETECTED NOT DETECTED Final   Klebsiella pneumoniae NOT DETECTED NOT DETECTED Final   Proteus species NOT DETECTED NOT DETECTED Final   Salmonella species NOT DETECTED NOT DETECTED Final   Serratia marcescens NOT DETECTED NOT DETECTED Final   Haemophilus influenzae NOT DETECTED NOT DETECTED Final   Neisseria meningitidis NOT DETECTED NOT DETECTED Final   Pseudomonas aeruginosa NOT DETECTED NOT DETECTED Final   Stenotrophomonas maltophilia NOT DETECTED NOT DETECTED Final   Candida albicans NOT DETECTED NOT DETECTED Final   Candida auris NOT DETECTED NOT DETECTED Final   Candida glabrata NOT DETECTED NOT DETECTED Final   Candida krusei  NOT DETECTED NOT DETECTED Final   Candida parapsilosis NOT DETECTED NOT DETECTED Final   Candida tropicalis NOT DETECTED NOT DETECTED Final   Cryptococcus neoformans/gattii NOT DETECTED NOT DETECTED Final    Comment: Performed at [facility], [address], [place], [place] [address]  Culture, blood (Routine X 2) w Reflex to ID Panel     Status: None (Preliminary result)   Collection Time: 01/17/24  6:22 PM   Specimen: BLOOD RIGHT HAND  Result Value Ref Range Status   Specimen Description BLOOD RIGHT HAND  Final   Special Requests   Final    BOTTLES DRAWN AEROBIC AND ANAEROBIC Blood Culture results may not be optimal due to an inadequate volume of blood received in culture bottles   Culture   Final    NO GROWTH 4 DAYS Performed at [facility], [address], [place], [place] [address]    Report Status PENDING  Incomplete  Urine Culture (for pregnant, neutropenic or urologic patients or patients with an indwelling urinary catheter)     Status: Abnormal   Collection Time: 01/18/24  8:41 AM   Specimen: Urine, Clean Catch  Result Value Ref Range Status   Specimen Description URINE, CLEAN CATCH  Final   Special Requests   Final    NONE Performed at [facility], [address] [place], [place] [address]    Culture >=100,000 COLONIES/mL KLEBSIELLA PNEUMONIAE (A)  Final   Report Status 01/20/2024 FINAL  Final   Organism ID, Bacteria KLEBSIELLA PNEUMONIAE (A)  Final      Susceptibility   Klebsiella pneumoniae - MIC*    AMPICILLIN RESISTANT Resistant     CEFAZOLIN <=4 SENSITIVE Sensitive     CEFEPIME <=0.12 SENSITIVE Sensitive     CEFTRIAXONE <=0.25 SENSITIVE Sensitive     CIPROFLOXACIN <=0.25 SENSITIVE Sensitive     GENTAMICIN <=1 SENSITIVE Sensitive     IMIPENEM <=0.25 SENSITIVE Sensitive     NITROFURANTOIN 64 INTERMEDIATE Intermediate     TRIMETH/SULFA <=20 SENSITIVE Sensitive     AMPICILLIN/SULBACTAM 4 SENSITIVE Sensitive     PIP/TAZO <=4 SENSITIVE Sensitive  ug/mL    * >=100,000 COLONIES/mL KLEBSIELLA PNEUMONIAE  Culture, blood (Routine X 2) w Reflex to ID Panel     Status: None (Preliminary result)   Collection Time: 01/18/24  3:43 PM   Specimen: BLOOD LEFT ARM  Result Value Ref Range Status   Specimen Description BLOOD LEFT ARM  Final   Special Requests   Final    BOTTLES DRAWN AEROBIC AND ANAEROBIC Blood Culture results may not be optimal due to an inadequate volume of blood received in culture bottles  Culture   Final    NO GROWTH 3 DAYS Performed at [facility], [address] [place], [place] [address]    Report Status PENDING  Incomplete  Culture, blood (Routine X 2) w Reflex to ID Panel     Status: None (Preliminary result)   Collection Time: 01/18/24  3:46 PM   Specimen: BLOOD LEFT HAND  Result Value Ref Range Status   Specimen Description BLOOD LEFT HAND  Final   Special Requests   Final    BOTTLES DRAWN AEROBIC AND ANAEROBIC Blood Culture results may not be optimal due to an inadequate volume of blood received in culture bottles   Culture   Final    NO GROWTH 3 DAYS Performed at [facility], [address] [place], [place] [address]    Report Status PENDING  Incomplete    FURTHER DISCHARGE INSTRUCTIONS:  Get Medicines reviewed and adjusted: Please take all your medications with you for your next visit with your Primary MD  Laboratory/radiological data: Please request your Primary MD to go over all hospital tests and procedure/radiological results at the follow up, please ask your Primary MD to get all Hospital records sent to his/her office.  In some cases, they will be blood work, cultures and biopsy results pending at the time of your discharge. Please request that your primary care M.D. goes through all the records of your hospital data and follows up on these results.  Also Note the following: If you experience worsening of your admission symptoms, develop shortness of breath, life threatening  emergency, suicidal or homicidal thoughts you must seek medical attention immediately by calling 911 or calling your MD immediately  if symptoms less severe.  You must read complete instructions/literature along with all the possible adverse reactions/side effects for all the Medicines you take and that have been prescribed to you. Take any new Medicines after you have completely understood and accpet all the possible adverse reactions/side effects.   Do not drive when taking Pain medications or sleeping medications (Benzodaizepines)  Do not take more than prescribed Pain, Sleep and Anxiety Medications. It is not advisable to combine anxiety,sleep and pain medications without talking with your primary care practitioner  Special Instructions: If you have smoked or chewed Tobacco  in the last 2 yrs please stop smoking, stop any regular Alcohol  and or any Recreational drug use.  Wear Seat belts while driving.  Please note: You were cared for by a hospitalist during your hospital stay. Once you are discharged, your primary care physician will handle any further medical issues. Please note that NO REFILLS for any discharge medications will be authorized once you are discharged, as it is imperative that you return to your primary care physician (or establish a relationship with a primary care physician if you do not have one) for your post hospital discharge needs so that they can reassess your need for medications and monitor your lab values.  Total Time spent coordinating discharge including counseling, education and face to face time equals greater than 30 minutes.  SignedJeoffrey Massed 01/22/2024 9:26 AM

## 2024-01-22 NOTE — Progress Notes (Signed)
Patient noted to have 3  small pressure sore in between his bilateral  buttocks (0.5 x 0.5, 0.2 x.0.2 and 0.6 x 0.5) on discharge skin assessment sacral foam applied

## 2024-01-23 ENCOUNTER — Telehealth: Payer: Self-pay

## 2024-01-23 LAB — CULTURE, BLOOD (ROUTINE X 2)
Culture: NO GROWTH
Culture: NO GROWTH

## 2024-01-23 NOTE — Transitions of Care (Post Inpatient/ED Visit) (Signed)
   01/23/2024  Name: BERNIE DELCONTE MRN: [id number] DOB: [birth year]  Today's TOC FU Call Status: Today's TOC FU Call Status:: Unsuccessful Call (1st Attempt) Unsuccessful Call (1st Attempt) Date: 01/23/24  Attempted to reach the patient regarding the most recent Inpatient/ED visit.  Follow Up Plan: Additional outreach attempts will be made to reach the patient to complete the Transitions of Care (Post Inpatient/ED visit) call.   Jodelle Gross RN, BSN, CCM [facility] RN Care Manager/Transition of Care Direct Dial: [phone]  Fax: [phone]

## 2024-01-24 ENCOUNTER — Telehealth: Payer: Self-pay

## 2024-01-24 NOTE — Transitions of Care (Post Inpatient/ED Visit) (Signed)
   01/24/2024  Name: Andre Stout MRN: [id number] DOB: August 28, 1940  Today's TOC FU Call Status: Today's TOC FU Call Status:: Unsuccessful Call (2nd Attempt) Unsuccessful Call (2nd Attempt) Date: 01/24/24  Attempted to reach the patient regarding the most recent Inpatient/ED visit.  Follow Up Plan: Additional outreach attempts will be made to reach the patient to complete the Transitions of Care (Post Inpatient/ED visit) call. Message left with return call information.  Jodelle Gross RN, BSN, CCM [facility] RN Care Manager/Transition of Care Direct Dial: [phone]  Fax: [phone]

## 2024-01-28 ENCOUNTER — Telehealth: Payer: Self-pay

## 2024-01-28 NOTE — Transitions of Care (Post Inpatient/ED Visit) (Signed)
   01/28/2024  Name: KORTEZ MURTAGH MRN: [id number] DOB: [birth year]  Today's TOC FU Call Status: Today's TOC FU Call Status:: Unsuccessful Call (3rd Attempt) Unsuccessful Call (3rd Attempt) Date: 01/28/24  Attempted to reach the patient regarding the most recent Inpatient/ED visit.  Follow Up Plan: No further outreach attempts will be made at this time. We have been unable to contact the patient.  Jodelle Gross RN, BSN, CCM [place] Manager Population Health Direct Dial: [phone]  Fax: [phone]

## 2024-01-29 ENCOUNTER — Telehealth: Payer: Self-pay | Admitting: Family

## 2024-01-29 NOTE — Telephone Encounter (Signed)
Copied from CRM [id number]. Topic: Clinical - Home Health Verbal Orders >> Jan 29, 2024  3:13 PM Ernst Spell wrote: Caller/Agency: Jhonnie Garner Callback Number: [phone]([id number] VM) Service Requested: Physical Therapy Frequency: 1x week for 8 weeks Any new concerns about the patient? No

## 2024-01-30 NOTE — Telephone Encounter (Signed)
Verbal orders were left on secured VM. Charisse March for any further questions or concerns to give the office a call back.

## 2024-01-31 ENCOUNTER — Telehealth: Payer: Self-pay

## 2024-01-31 NOTE — Telephone Encounter (Signed)
 LMOM for a return call.

## 2024-01-31 NOTE — Telephone Encounter (Signed)
Copied from CRM [id number]. Topic: Clinical - Home Health Verbal Orders >> Jan 31, 2024  8:23 AM Lennart Pall wrote: Caller/Agency: Deri Fuelling Number: [phone] Service Requested: Occupational Therapy Frequency: 1 time a week fo 7 weeks Any new concerns about the patient? No

## 2024-02-04 NOTE — Telephone Encounter (Signed)
Called again and left a VM asking to call the office back.

## 2024-02-05 ENCOUNTER — Telehealth: Payer: Self-pay

## 2024-02-05 NOTE — Telephone Encounter (Signed)
 Called agent and left a VM asking agent to give the office a call back.

## 2024-02-05 NOTE — Telephone Encounter (Signed)
 Copied from CRM [id number]. Topic: Clinical - Home Health Verbal Orders >> Jan 31, 2024  8:23 AM Lennart Pall wrote: Caller/Agency: Deri Fuelling Number: [phone] Service Requested: Occupational Therapy Frequency: 1 time a week fo 7 weeks Any new concerns about the patient? No

## 2024-02-05 NOTE — Telephone Encounter (Signed)
 Orders have been given.

## 2024-02-10 ENCOUNTER — Ambulatory Visit: Payer: Medicare Other | Admitting: Internal Medicine

## 2024-02-13 ENCOUNTER — Other Ambulatory Visit (HOSPITAL_COMMUNITY): Payer: Medicare Other

## 2024-02-14 ENCOUNTER — Other Ambulatory Visit (HOSPITAL_COMMUNITY): Payer: Medicare Other

## 2024-02-18 ENCOUNTER — Ambulatory Visit: Payer: Medicare Other | Admitting: Internal Medicine

## 2024-02-28 ENCOUNTER — Emergency Department (HOSPITAL_COMMUNITY)
Admission: EM | Admit: 2024-02-28 | Discharge: 2024-02-29 | Disposition: A | Discharge status: Home / Self Care | Payer: Medicare Other | Attending: Emergency Medicine | Admitting: Emergency Medicine

## 2024-02-28 ENCOUNTER — Other Ambulatory Visit: Payer: Self-pay | Admitting: Physician Assistant

## 2024-02-28 ENCOUNTER — Emergency Department (HOSPITAL_COMMUNITY): Payer: Medicare Other

## 2024-02-28 ENCOUNTER — Other Ambulatory Visit: Payer: Self-pay

## 2024-02-28 ENCOUNTER — Other Ambulatory Visit: Payer: Self-pay | Admitting: Family

## 2024-02-28 ENCOUNTER — Encounter (HOSPITAL_COMMUNITY): Payer: Self-pay

## 2024-02-28 DIAGNOSIS — Z8546 Personal history of malignant neoplasm of prostate: Secondary | ICD-10-CM | POA: Insufficient documentation

## 2024-02-28 DIAGNOSIS — D72829 Elevated white blood cell count, unspecified: Secondary | ICD-10-CM | POA: Insufficient documentation

## 2024-02-28 DIAGNOSIS — Z7901 Long term (current) use of anticoagulants: Secondary | ICD-10-CM | POA: Insufficient documentation

## 2024-02-28 DIAGNOSIS — Z7982 Long term (current) use of aspirin: Secondary | ICD-10-CM | POA: Insufficient documentation

## 2024-02-28 DIAGNOSIS — Z794 Long term (current) use of insulin: Secondary | ICD-10-CM | POA: Insufficient documentation

## 2024-02-28 DIAGNOSIS — T85518A Breakdown (mechanical) of other gastrointestinal prosthetic devices, implants and grafts, initial encounter: Secondary | ICD-10-CM | POA: Diagnosis present

## 2024-02-28 DIAGNOSIS — E119 Type 2 diabetes mellitus without complications: Secondary | ICD-10-CM | POA: Diagnosis not present

## 2024-02-28 DIAGNOSIS — H9202 Otalgia, left ear: Secondary | ICD-10-CM

## 2024-02-28 DIAGNOSIS — Y732 Prosthetic and other implants, materials and accessory gastroenterology and urology devices associated with adverse incidents: Secondary | ICD-10-CM | POA: Insufficient documentation

## 2024-02-28 LAB — CBC WITH DIFFERENTIAL/PLATELET
Abs Immature Granulocytes: 0.05 10*3/uL (ref 0.00–0.07)
Basophils Absolute: 0.1 10*3/uL (ref 0.0–0.1)
Basophils Relative: 1 %
Eosinophils Absolute: 0.2 10*3/uL (ref 0.0–0.5)
Eosinophils Relative: 1 %
HCT: 43.5 % (ref 39.0–52.0)
Hemoglobin: 14 g/dL (ref 13.0–17.0)
Immature Granulocytes: 0 %
Lymphocytes Relative: 20 %
Lymphs Abs: 2.6 10*3/uL (ref 0.7–4.0)
MCH: 26.6 pg (ref 26.0–34.0)
MCHC: 32.2 g/dL (ref 30.0–36.0)
MCV: 82.7 fL (ref 80.0–100.0)
Monocytes Absolute: 1 10*3/uL (ref 0.1–1.0)
Monocytes Relative: 7 %
Neutro Abs: 9.5 10*3/uL — ABNORMAL HIGH (ref 1.7–7.7)
Neutrophils Relative %: 71 %
Platelets: 452 10*3/uL — ABNORMAL HIGH (ref 150–400)
RBC: 5.26 MIL/uL (ref 4.22–5.81)
RDW: 16.3 % — ABNORMAL HIGH (ref 11.5–15.5)
WBC: 13.4 10*3/uL — ABNORMAL HIGH (ref 4.0–10.5)
nRBC: 0 % (ref 0.0–0.2)

## 2024-02-28 LAB — COMPREHENSIVE METABOLIC PANEL
ALT: 28 U/L (ref 0–44)
AST: 32 U/L (ref 15–41)
Albumin: 3.6 g/dL (ref 3.5–5.0)
Alkaline Phosphatase: 75 U/L (ref 38–126)
Anion gap: 12 (ref 5–15)
BUN: 48 mg/dL — ABNORMAL HIGH (ref 8–23)
CO2: 25 mmol/L (ref 22–32)
Calcium: 10.5 mg/dL — ABNORMAL HIGH (ref 8.9–10.3)
Chloride: 95 mmol/L — ABNORMAL LOW (ref 98–111)
Creatinine, Ser: 1.7 mg/dL — ABNORMAL HIGH (ref 0.61–1.24)
GFR, Estimated: 40 mL/min — ABNORMAL LOW (ref >60)
Glucose, Bld: 118 mg/dL — ABNORMAL HIGH (ref 70–99)
Potassium: 3.8 mmol/L (ref 3.5–5.1)
Sodium: 132 mmol/L — ABNORMAL LOW (ref 135–145)
Total Bilirubin: 1 mg/dL (ref 0.0–1.2)
Total Protein: 8.6 g/dL — ABNORMAL HIGH (ref 6.5–8.1)

## 2024-02-28 LAB — PROTIME-INR
INR: 1 (ref 0.8–1.2)
Prothrombin Time: 13.6 s (ref 11.4–15.2)

## 2024-02-28 LAB — LIPASE, BLOOD: Lipase: 20 U/L (ref 11–51)

## 2024-02-28 MED ORDER — IOHEXOL 300 MG/ML  SOLN
80.0000 mL | Freq: Once | INTRAMUSCULAR | Status: AC | PRN
  Administered 2024-02-28: 80 mL via INTRAVENOUS

## 2024-02-28 MED ORDER — SODIUM CHLORIDE 0.9 % IV BOLUS
1000.0000 mL | Freq: Once | INTRAVENOUS | Status: AC
  Administered 2024-02-28: 1000 mL via INTRAVENOUS

## 2024-02-28 NOTE — ED Triage Notes (Signed)
 Pt has external drain for gallbladder due to inability to have cholecystectomy. Pt got up to use the bathroom at home around 1730 and stepped on the drain and pulled it out. Open drain site to RLQ

## 2024-02-28 NOTE — ED Provider Notes (Signed)
 11:29 PM Assumed care from Dr. Charm Barges, please see their note for full history, physical and decision making until this point. In brief this is a 84 y.o. year old male who presented to the ED tonight with No chief complaint on file.     Drain in for 2 years. Out now. Pending CT for evaluation. Needs replaced but probably nt here.   CT ok. Still cholelithiasis. D/w Dr. Sheliah Hatch with surgery who did recommend replacement since he still has stones but doesn't need it done this weekend, can follow up with IR next week to get it placed. Patient already has an appointment on Tuesday. Will call IR and see if they want him to come Monday or not. Will return here for concerning symptoms.  Cbg's got a bit low while NPO to the mid 60's. Improved with PO. Fluids given 2/2 AKI, need recheck by PCP in a week or so. otherwise cbg's consistently improved. Feels well. Stable for d/c.   Discharge instructions, including strict return precautions for new or worsening symptoms, given. Patient and/or family verbalized understanding and agreement with the plan as described.   Labs, studies and imaging reviewed by myself and considered in medical decision making if ordered. Imaging interpreted by radiology.  Labs Reviewed  COMPREHENSIVE METABOLIC PANEL - Abnormal; Notable for the following components:      Result Value   Sodium 132 (*)    Chloride 95 (*)    Glucose, Bld 118 (*)    BUN 48 (*)    Creatinine, Ser 1.70 (*)    Calcium 10.5 (*)    Total Protein 8.6 (*)    GFR, Estimated 40 (*)    All other components within normal limits  CBC WITH DIFFERENTIAL/PLATELET - Abnormal; Notable for the following components:   WBC 13.4 (*)    RDW 16.3 (*)    Platelets 452 (*)    Neutro Abs 9.5 (*)    All other components within normal limits  LIPASE, BLOOD  PROTIME-INR  URINALYSIS, ROUTINE W REFLEX MICROSCOPIC    CT ABDOMEN PELVIS W CONTRAST    (Results Pending)    No follow-ups on file.    Marily Memos,  MD 02/29/24 [phone]

## 2024-02-28 NOTE — ED Provider Notes (Signed)
[place] EMERGENCY DEPARTMENT AT [facility] Provider Note   CSN: [id number] Arrival date & time: 02/28/24  2021     History  No chief complaint on file.   Andre Stout is a 84 y.o. male.  He has a history of diabetes, prostate cancer, stroke, cholecystitis with a cholecystostomy tube.  He said tonight he accidentally stepped on the drain and his tube pulled out.  He denies any other complaints.  He has had ongoing chronic diarrhea.  Denies any significant abdominal pain fever shortness of breath chest pain.  The history is provided by the patient and a relative.       Home Medications Prior to Admission medications   Medication Sig Start Date End Date Taking? Authorizing Provider  acetaminophen (TYLENOL) 500 MG tablet Take 1,000 mg by mouth daily as needed (pain).    [provider]  amLODipine (NORVASC) 10 MG tablet Take 1 tablet (10 mg total) by mouth daily. 11/15/23   Olive Bass, FNP  aspirin 81 MG chewable tablet Chew 81 mg by mouth daily. 01/06/22   [provider]  atorvastatin (LIPITOR) 40 MG tablet Take 1 tablet (40 mg total) by mouth daily. 11/15/23   Olive Bass, FNP  Azelastine HCl 137 MCG/SPRAY SOLN PLACE 1 SPRAY INTO BOTH NOSTRILS 2 (TWO) TIMES DAILY. USE IN [facility] NOSTRIL AS DIRECTED 02/28/24   Drubel, Lillia Abed, PA-C  benzonatate (TESSALON) 100 MG capsule Take 1 capsule (100 mg total) by mouth 3 (three) times daily as needed for cough. 12/17/23   Olive Bass, FNP  Blood Glucose Monitoring Suppl (ACCU-CHEK GUIDE) w/Device KIT Use As Directed 08/24/22   Lanae Boast, MD  brimonidine (ALPHAGAN) 0.2 % ophthalmic solution Place 1 drop into both eyes 2 (two) times daily with breakfast and lunch. 12/18/21   [provider]  clopidogrel (PLAVIX) 75 MG tablet Take 1 tablet (75 mg total) by mouth daily. 11/15/23   Olive Bass, FNP  Continuous Blood Gluc Receiver (FREESTYLE LIBRE 3 READER) DEVI 1 each by  Does not apply route daily. 01/22/23   Carlus Pavlov, MD  Continuous Blood Gluc Sensor (FREESTYLE LIBRE 3 SENSOR) MISC 1 each by Does not apply route every 14 (fourteen) days. 01/22/23   Carlus Pavlov, MD  dorzolamide-timolol (COSOPT) 22.3-6.8 MG/ML ophthalmic solution Place 1 drop into both eyes 2 (two) times daily with breakfast and lunch. 05/18/22   [provider]  insulin aspart protamine - aspart (NOVOLOG 70/30 FLEXPEN) (70-30) 100 UNIT/ML FlexPen 30 units before b'fast 20 units before lunch 15 units before dinner Take the insulin 10-15 min before meals. 11/15/23   Carlus Pavlov, MD  Insulin Pen Needle 32G X 4 MM MISC Use to inject insulin 3 times a day 11/15/23   Carlus Pavlov, MD  Insulin Syringe-Needle U-100 (BD INSULIN SYRINGE U/F) 31G X 5/16" 1 ML MISC USE 2 DAILY 11/21/22   Carlus Pavlov, MD  latanoprost (XALATAN) 0.005 % ophthalmic solution Place 1 drop into both eyes at bedtime. 01/01/22   [provider]  magnesium oxide (MAG-OX) 400 (240 Mg) MG tablet TAKE 1 TABLET BY MOUTH TWICE A DAY 02/28/24   Olive Bass, FNP  Multiple Vitamins-Minerals (PRESERVISION AREDS 2) CAPS Take 1 capsule by mouth at bedtime.    [provider]  ondansetron (ZOFRAN) 4 MG tablet Take 4 mg by mouth every 8 (eight) hours as needed. 12/01/23   [provider]  Dola Argyle LANCETS 33G MISC Use to check blood sugar  4 times per day. Dx code: E11.9 03/27/16   Romero Belling, MD  [facility] ULTRA test strip USE TO MONITOR GLUCOSE LEVELS 4 TIMES PER DAY E11.9 06/10/19   Romero Belling, MD  pantoprazole (PROTONIX) 40 MG tablet Take 1 tablet (40 mg total) by mouth 2 (two) times daily before a meal. 11/15/23   Olive Bass, FNP  sodium chloride flush 0.9 % SOLN injection Inject 5 mLs into the vein as needed for up to 30 doses. Discard remainder of syringe. Patient not taking: Reported on 01/17/2024 08/24/22   Lanae Boast, MD      Allergies    Patient has  no known allergies.    Review of Systems   Review of Systems  Physical Exam Updated Vital Signs BP 124/69   Pulse 83   Temp 97.7 F (36.5 C) (Oral)   Resp 16   Ht 5' (1.524 m)   Wt 77.1 kg   SpO2 95%   BMI 33.20 kg/m  Physical Exam Vitals and nursing note reviewed.  Constitutional:      General: He is not in acute distress.    Appearance: Normal appearance. He is well-developed.  HENT:     Head: Normocephalic and atraumatic.  Eyes:     Conjunctiva/sclera: Conjunctivae normal.  Cardiovascular:     Rate and Rhythm: Normal rate and regular rhythm.     Heart sounds: No murmur heard. Pulmonary:     Effort: Pulmonary effort is normal. No respiratory distress.     Breath sounds: Normal breath sounds.  Abdominal:     Palpations: Abdomen is soft.     Tenderness: There is no abdominal tenderness. There is no guarding or rebound.     Comments: He has an open wound in his right upper quadrant that is not draining anything.  Musculoskeletal:        General: No deformity.     Cervical back: Neck supple.  Skin:    General: Skin is warm and dry.     Capillary Refill: Capillary refill takes less than 2 seconds.  Neurological:     General: No focal deficit present.     Mental Status: He is alert.     Motor: No weakness.     ED Results / Procedures / Treatments   Labs (all labs ordered are listed, but only abnormal results are displayed) Labs Reviewed  COMPREHENSIVE METABOLIC PANEL - Abnormal; Notable for the following components:      Result Value   Sodium 132 (*)    Chloride 95 (*)    Glucose, Bld 118 (*)    BUN 48 (*)    Creatinine, Ser 1.70 (*)    Calcium 10.5 (*)    Total Protein 8.6 (*)    GFR, Estimated 40 (*)    All other components within normal limits  CBC WITH DIFFERENTIAL/PLATELET - Abnormal; Notable for the following components:   WBC 13.4 (*)    RDW 16.3 (*)    Platelets 452 (*)    Neutro Abs 9.5 (*)    All other components within normal limits  CBG  MONITORING, ED - Abnormal; Notable for the following components:   Glucose-Capillary 100 (*)    All other components within normal limits  CBG MONITORING, ED - Abnormal; Notable for the following components:   Glucose-Capillary 157 (*)    All other components within normal limits  LIPASE, BLOOD  PROTIME-INR    EKG None  Radiology CT ABDOMEN PELVIS W CONTRAST Result Date: 02/28/2024  CLINICAL DATA:  Abdominal pain, inverted removal of cholecystostomy tube. EXAM: CT ABDOMEN AND PELVIS WITH CONTRAST TECHNIQUE: Multidetector CT imaging of the abdomen and pelvis was performed using the standard protocol following bolus administration of intravenous contrast. RADIATION DOSE REDUCTION: This exam was performed according to the departmental dose-optimization program which includes automated exposure control, adjustment of the mA and/or kV according to patient size and/or use of iterative reconstruction technique. CONTRAST:  80mL OMNIPAQUE IOHEXOL 300 MG/ML  SOLN COMPARISON:  08/18/2023 FINDINGS: Lower chest: No acute abnormality. Hepatobiliary: Mild fatty infiltration of the liver is noted. The gallbladder is partially distended. Dependent gallstones are noted within. The tract for or the previous cholecystostomy tube is noted. No definitive leakage of bile adjacent to the liver is seen. No free fluid to suggest bile leak is noted. Pancreas: Unremarkable. No pancreatic ductal dilatation or surrounding inflammatory changes. Spleen: Normal in size without focal abnormality. Adrenals/Urinary Tract: Adrenal glands are within normal limits. Kidneys demonstrate a normal enhancement pattern bilaterally. Large stable cyst is noted within the right kidney no follow-up is recommended. It measures 10.5 cm in greatest dimension. The bladder is partially distended. Stomach/Bowel: No obstructive or inflammatory changes of the colon are noted. The appendix is within normal limits. Small bowel and stomach are unremarkable.  Vascular/Lymphatic: Aortic atherosclerosis. No enlarged abdominal or pelvic lymph nodes. Reproductive: Prostate is unremarkable. Prostate brachytherapy seeds are noted. Other: No abdominal wall hernia or abnormality. No abdominopelvic ascites. Musculoskeletal: No acute or significant osseous findings. IMPRESSION: Cholecystostomy tube has been anteverted Karie Mainland removed. No definitive bile leak into the abdomen is noted. The tract is well visualized. Cholelithiasis. Electronically Signed   By: Alcide Clever M.D.   On: 02/28/2024 23:48    Procedures Procedures    Medications Ordered in ED Medications - No data to display  ED Course/ Medical Decision Making/ A&P                                 Medical Decision Making Amount and/or Complexity of Data Reviewed Labs: ordered. Radiology: ordered.  Risk Prescription drug management.   This patient complains of accidentally dislodging his tube; this involves an extensive number of treatment Options and is a complaint that carries with it a high risk of complications and morbidity. The differential includes cholecystitis, cholelithiasis, abscess, peritonitis  I ordered, reviewed and interpreted labs, which included CBC with mildly elevated white count, chemistries with increased creatinine, LFTs normal I ordered medication IV fluids and reviewed PMP when indicated. I ordered imaging studies which included CAT scan abdomen and pelvis and I independently    visualized and interpreted imaging which showed no acute findings other than tube displaced Additional history obtained from patient's son Previous records obtained and reviewed in epic including recent discharge summary Cardiac monitoring reviewed, normal sinus rhythm Social determinants considered, physically inactive Critical Interventions: None  After the interventions stated above, I reevaluated the patient and found patient to be fairly asymptomatic Admission and further testing  considered, his care is signed out to Dr. Clayborne Dana to follow-up on reading of CAT scan.  Likely will need tube replacement although unclear if needs this done emergently.\        Final Clinical Impression(s) / ED Diagnoses Final diagnoses:  Cholecystostomy tube dysfunction, initial encounter    Rx / DC Orders ED Discharge Orders     None         Terrilee Files, MD 02/29/24  1031  

## 2024-02-29 LAB — CBG MONITORING, ED
Glucose-Capillary: 100 mg/dL — ABNORMAL HIGH (ref 70–99)
Glucose-Capillary: 157 mg/dL — ABNORMAL HIGH (ref 70–99)

## 2024-02-29 NOTE — ED Notes (Signed)
 This RN was made aware by the pt that his glucose monitoring device showed the pt blood glucose was 191 when he arrived at the ED tonight. At 2244 we pulled a  blood glucose of 118. While this RN was in the pts room at 0000 the pts glucose monitoring device started alarming indicating the pts blood glucose was at 85. This RN confirmed with the MD Mesner if it was ok to feed the pt due to the pt endorsing that his blood sugar with drop fast if he does not eat.

## 2024-02-29 NOTE — Discharge Instructions (Addendum)
 Keep the area around the ostomy site clean and covered. You can expect some drainage and make sure you clean it quickly as it can be irritating to the skin.   Please return to the ED if you start having severe pain in the area, fevers, nausea, vomiting or other concerning symptoms.   Please discuss with interventional radiology if they want to do it on Monday instead of Tuesday, but Tuesday should be fine.

## 2024-02-29 NOTE — ED Notes (Signed)
 This RN obtained a POC blood glucose on the pt at this time. Pt had already eaten half his sandwich and drink 4 oz of sprit.

## 2024-02-29 NOTE — ED Notes (Signed)
 Pt was given some food.

## 2024-03-03 ENCOUNTER — Inpatient Hospital Stay (HOSPITAL_COMMUNITY)
Admission: RE | Admit: 2024-03-03 | Discharge: 2024-03-03 | Disposition: A | Discharge status: Home / Self Care | Payer: Medicare Other | Source: Ambulatory Visit | Attending: Interventional Radiology | Admitting: Interventional Radiology

## 2024-03-03 ENCOUNTER — Other Ambulatory Visit: Payer: Self-pay

## 2024-03-03 ENCOUNTER — Emergency Department (HOSPITAL_COMMUNITY)

## 2024-03-03 ENCOUNTER — Observation Stay (HOSPITAL_COMMUNITY)
Admission: EM | Admit: 2024-03-03 | Discharge: 2024-03-05 | Disposition: A | Discharge status: Home / Self Care | Attending: Internal Medicine | Admitting: Internal Medicine

## 2024-03-03 ENCOUNTER — Encounter (HOSPITAL_COMMUNITY): Payer: Self-pay | Admitting: Internal Medicine

## 2024-03-03 DIAGNOSIS — E1169 Type 2 diabetes mellitus with other specified complication: Secondary | ICD-10-CM | POA: Diagnosis present

## 2024-03-03 DIAGNOSIS — Z87891 Personal history of nicotine dependence: Secondary | ICD-10-CM | POA: Diagnosis not present

## 2024-03-03 DIAGNOSIS — E871 Hypo-osmolality and hyponatremia: Principal | ICD-10-CM | POA: Diagnosis present

## 2024-03-03 DIAGNOSIS — Z8673 Personal history of transient ischemic attack (TIA), and cerebral infarction without residual deficits: Secondary | ICD-10-CM

## 2024-03-03 DIAGNOSIS — I10 Essential (primary) hypertension: Secondary | ICD-10-CM | POA: Insufficient documentation

## 2024-03-03 DIAGNOSIS — K810 Acute cholecystitis: Secondary | ICD-10-CM | POA: Diagnosis not present

## 2024-03-03 DIAGNOSIS — E119 Type 2 diabetes mellitus without complications: Secondary | ICD-10-CM | POA: Insufficient documentation

## 2024-03-03 DIAGNOSIS — K219 Gastro-esophageal reflux disease without esophagitis: Secondary | ICD-10-CM | POA: Insufficient documentation

## 2024-03-03 DIAGNOSIS — I441 Atrioventricular block, second degree: Secondary | ICD-10-CM | POA: Insufficient documentation

## 2024-03-03 DIAGNOSIS — Z8679 Personal history of other diseases of the circulatory system: Secondary | ICD-10-CM

## 2024-03-03 DIAGNOSIS — Z7901 Long term (current) use of anticoagulants: Secondary | ICD-10-CM | POA: Insufficient documentation

## 2024-03-03 DIAGNOSIS — E1165 Type 2 diabetes mellitus with hyperglycemia: Secondary | ICD-10-CM

## 2024-03-03 DIAGNOSIS — K632 Fistula of intestine: Secondary | ICD-10-CM

## 2024-03-03 DIAGNOSIS — Z794 Long term (current) use of insulin: Secondary | ICD-10-CM | POA: Diagnosis not present

## 2024-03-03 DIAGNOSIS — E785 Hyperlipidemia, unspecified: Secondary | ICD-10-CM | POA: Diagnosis not present

## 2024-03-03 DIAGNOSIS — Z79899 Other long term (current) drug therapy: Secondary | ICD-10-CM | POA: Insufficient documentation

## 2024-03-03 DIAGNOSIS — T85520A Displacement of bile duct prosthesis, initial encounter: Principal | ICD-10-CM | POA: Insufficient documentation

## 2024-03-03 LAB — CBC
HCT: 37.7 % — ABNORMAL LOW (ref 39.0–52.0)
Hemoglobin: 12.2 g/dL — ABNORMAL LOW (ref 13.0–17.0)
MCH: 26.9 pg (ref 26.0–34.0)
MCHC: 32.4 g/dL (ref 30.0–36.0)
MCV: 83.2 fL (ref 80.0–100.0)
Platelets: 325 10*3/uL (ref 150–400)
RBC: 4.53 MIL/uL (ref 4.22–5.81)
RDW: 16 % — ABNORMAL HIGH (ref 11.5–15.5)
WBC: 10.8 10*3/uL — ABNORMAL HIGH (ref 4.0–10.5)
nRBC: 0 % (ref 0.0–0.2)

## 2024-03-03 LAB — COMPREHENSIVE METABOLIC PANEL
ALT: 14 U/L (ref 0–44)
AST: 17 U/L (ref 15–41)
Albumin: 2.8 g/dL — ABNORMAL LOW (ref 3.5–5.0)
Alkaline Phosphatase: 66 U/L (ref 38–126)
Anion gap: 9 (ref 5–15)
BUN: 27 mg/dL — ABNORMAL HIGH (ref 8–23)
CO2: 25 mmol/L (ref 22–32)
Calcium: 9.3 mg/dL (ref 8.9–10.3)
Chloride: 94 mmol/L — ABNORMAL LOW (ref 98–111)
Creatinine, Ser: 1.38 mg/dL — ABNORMAL HIGH (ref 0.61–1.24)
GFR, Estimated: 51 mL/min — ABNORMAL LOW (ref >60)
Glucose, Bld: 315 mg/dL — ABNORMAL HIGH (ref 70–99)
Potassium: 4.5 mmol/L (ref 3.5–5.1)
Sodium: 128 mmol/L — ABNORMAL LOW (ref 135–145)
Total Bilirubin: 1.4 mg/dL — ABNORMAL HIGH (ref 0.0–1.2)
Total Protein: 6.9 g/dL (ref 6.5–8.1)

## 2024-03-03 LAB — BASIC METABOLIC PANEL
Anion gap: 11 (ref 5–15)
BUN: 24 mg/dL — ABNORMAL HIGH (ref 8–23)
CO2: 21 mmol/L — ABNORMAL LOW (ref 22–32)
Calcium: 9.2 mg/dL (ref 8.9–10.3)
Chloride: 101 mmol/L (ref 98–111)
Creatinine, Ser: 1.03 mg/dL (ref 0.61–1.24)
GFR, Estimated: >60 mL/min (ref >60)
Glucose, Bld: 156 mg/dL — ABNORMAL HIGH (ref 70–99)
Potassium: 4.1 mmol/L (ref 3.5–5.1)
Sodium: 133 mmol/L — ABNORMAL LOW (ref 135–145)

## 2024-03-03 LAB — CBG MONITORING, ED: Glucose-Capillary: 183 mg/dL — ABNORMAL HIGH (ref 70–99)

## 2024-03-03 LAB — GLUCOSE, CAPILLARY: Glucose-Capillary: 143 mg/dL — ABNORMAL HIGH (ref 70–99)

## 2024-03-03 MED ORDER — ONDANSETRON HCL 4 MG PO TABS: 4.0000 mg | ORAL_TABLET | Freq: Four times a day (QID) | ORAL | Status: DC | PRN

## 2024-03-03 MED ORDER — SENNOSIDES-DOCUSATE SODIUM 8.6-50 MG PO TABS: 1.0000 | ORAL_TABLET | Freq: Every evening | ORAL | Status: DC | PRN

## 2024-03-03 MED ORDER — INSULIN ASPART 100 UNIT/ML IJ SOLN
0.0000 [IU] | Freq: Every day | INTRAMUSCULAR | Status: DC
  Administered 2024-03-04: 4 [IU] via SUBCUTANEOUS
  Filled 2024-03-03: qty 0.05

## 2024-03-03 MED ORDER — INSULIN ASPART 100 UNIT/ML IJ SOLN
0.0000 [IU] | Freq: Three times a day (TID) | INTRAMUSCULAR | Status: DC
  Administered 2024-03-03: 3 [IU] via SUBCUTANEOUS
  Administered 2024-03-04: 2 [IU] via SUBCUTANEOUS
  Administered 2024-03-04: 5 [IU] via SUBCUTANEOUS
  Administered 2024-03-04: 2 [IU] via SUBCUTANEOUS
  Filled 2024-03-03: qty 0.15

## 2024-03-03 MED ORDER — INSULIN GLARGINE 100 UNIT/ML ~~LOC~~ SOLN
15.0000 [IU] | Freq: Two times a day (BID) | SUBCUTANEOUS | Status: DC
  Administered 2024-03-04 (×3): 15 [IU] via SUBCUTANEOUS
  Filled 2024-03-03 (×5): qty 0.15

## 2024-03-03 MED ORDER — SODIUM CHLORIDE 0.9 % IV SOLN: INTRAVENOUS | Status: AC

## 2024-03-03 MED ORDER — ACETAMINOPHEN 650 MG RE SUPP: 650.0000 mg | Freq: Four times a day (QID) | RECTAL | Status: DC | PRN

## 2024-03-03 MED ORDER — ENOXAPARIN SODIUM 40 MG/0.4ML IJ SOSY
40.0000 mg | PREFILLED_SYRINGE | INTRAMUSCULAR | Status: DC
  Administered 2024-03-03 – 2024-03-05 (×3): 40 mg via SUBCUTANEOUS
  Filled 2024-03-03 (×3): qty 0.4

## 2024-03-03 MED ORDER — PANTOPRAZOLE SODIUM 40 MG PO TBEC
40.0000 mg | DELAYED_RELEASE_TABLET | Freq: Two times a day (BID) | ORAL | Status: DC
  Administered 2024-03-03 – 2024-03-04 (×2): 40 mg via ORAL
  Filled 2024-03-03 (×4): qty 1

## 2024-03-03 MED ORDER — ONDANSETRON HCL 4 MG/2ML IJ SOLN: 4.0000 mg | Freq: Four times a day (QID) | INTRAMUSCULAR | Status: DC | PRN

## 2024-03-03 MED ORDER — ASPIRIN 81 MG PO CHEW
81.0000 mg | CHEWABLE_TABLET | Freq: Every day | ORAL | Status: DC
  Administered 2024-03-04: 81 mg via ORAL
  Filled 2024-03-03: qty 1

## 2024-03-03 MED ORDER — ACETAMINOPHEN 325 MG PO TABS: 650.0000 mg | ORAL_TABLET | Freq: Four times a day (QID) | ORAL | Status: DC | PRN

## 2024-03-03 MED ORDER — ATORVASTATIN CALCIUM 20 MG PO TABS
40.0000 mg | ORAL_TABLET | Freq: Every day | ORAL | Status: DC
  Administered 2024-03-03 – 2024-03-04 (×2): 40 mg via ORAL
  Filled 2024-03-03: qty 1
  Filled 2024-03-03: qty 2

## 2024-03-03 MED ORDER — ALBUTEROL SULFATE (2.5 MG/3ML) 0.083% IN NEBU: 2.5000 mg | INHALATION_SOLUTION | RESPIRATORY_TRACT | Status: DC | PRN

## 2024-03-03 MED ORDER — CLOPIDOGREL BISULFATE 75 MG PO TABS
75.0000 mg | ORAL_TABLET | Freq: Every day | ORAL | Status: DC
  Administered 2024-03-04: 75 mg via ORAL
  Filled 2024-03-03: qty 1

## 2024-03-03 NOTE — Plan of Care (Signed)

## 2024-03-03 NOTE — ED Notes (Signed)
 IR stated they are not putting a drain in now. Consulting with cardiology to see about clearing for surgery. IR stated they want an ostomy bag placed over his site and to consult with wound care if necessary.

## 2024-03-03 NOTE — ED Provider Notes (Signed)
 [place] EMERGENCY DEPARTMENT AT [facility] Provider Note   CSN: [id number] Arrival date & time: 03/03/24  0272     History  No chief complaint on file.   Andre Stout is a 84 y.o. male.  Presenting to the emergency department for biliary drain placement.  They were seen on 2/28 in the emergency department after the accidentally dislodged drain.  No fevers chills or worsening abdominal pain.  They went to their IR appointment today where they were unable to place a drain in the office as it was a routine office visit.  Patient reports that he was directed to the emergency department for placement.        Home Medications Prior to Admission medications   Medication Sig Start Date End Date Taking? Authorizing Provider  acetaminophen (TYLENOL) 500 MG tablet Take 1,000 mg by mouth daily as needed (pain).   Yes [provider]  amLODipine (NORVASC) 10 MG tablet Take 1 tablet (10 mg total) by mouth daily. 11/15/23  Yes Olive Bass, FNP  aspirin 81 MG chewable tablet Chew 81 mg by mouth daily. 01/06/22  Yes [provider]  atorvastatin (LIPITOR) 40 MG tablet Take 1 tablet (40 mg total) by mouth daily. 11/15/23  Yes Olive Bass, FNP  Azelastine HCl 137 MCG/SPRAY SOLN PLACE 1 SPRAY INTO BOTH NOSTRILS 2 (TWO) TIMES DAILY. USE IN EACH NOSTRIL AS DIRECTED 02/28/24  Yes Drubel, Lillia Abed, PA-C  clopidogrel (PLAVIX) 75 MG tablet Take 1 tablet (75 mg total) by mouth daily. 11/15/23  Yes Olive Bass, FNP  Continuous Blood Gluc Receiver (FREESTYLE LIBRE 3 READER) DEVI 1 each by Does not apply route daily. 01/22/23  Yes Carlus Pavlov, MD  Continuous Blood Gluc Sensor (FREESTYLE LIBRE 3 SENSOR) MISC 1 each by Does not apply route every 14 (fourteen) days. 01/22/23  Yes Carlus Pavlov, MD  insulin aspart protamine - aspart (NOVOLOG 70/30 FLEXPEN) (70-30) 100 UNIT/ML FlexPen 30 units before b'fast 20 units before lunch 15 units before  dinner Take the insulin 10-15 min before meals. 11/15/23  Yes Carlus Pavlov, MD  magnesium oxide (MAG-OX) 400 (240 Mg) MG tablet TAKE 1 TABLET BY MOUTH TWICE A DAY 02/28/24  Yes Olive Bass, FNP  Multiple Vitamins-Minerals (PRESERVISION AREDS 2) CAPS Take 1 capsule by mouth at bedtime.   Yes [provider]  ondansetron (ZOFRAN) 4 MG tablet Take 4 mg by mouth every 8 (eight) hours as needed. 12/01/23  Yes [provider]  pantoprazole (PROTONIX) 40 MG tablet Take 1 tablet (40 mg total) by mouth 2 (two) times daily before a meal. 11/15/23  Yes Olive Bass, FNP  Blood Glucose Monitoring Suppl (ACCU-CHEK GUIDE) w/Device KIT Use As Directed 08/24/22   Lanae Boast, MD  Insulin Pen Needle 32G X 4 MM MISC Use to inject insulin 3 times a day 11/15/23   Carlus Pavlov, MD  Insulin Syringe-Needle U-100 (BD INSULIN SYRINGE U/F) 31G X 5/16" 1 ML MISC USE 2 DAILY 11/21/22   Carlus Pavlov, MD  [facility] DELICA LANCETS 33G MISC Use to check blood sugar 4 times per day. Dx code: E11.9 03/27/16   Romero Belling, MD  [facility] ULTRA test strip USE TO MONITOR GLUCOSE LEVELS 4 TIMES PER DAY E11.9 06/10/19   Romero Belling, MD      Allergies    Patient has no known allergies.    Review of Systems   Review of Systems  Physical Exam Updated Vital Signs BP (!) 126/59 (BP  Location: Left Arm)   Pulse 68   Temp 98.6 F (37 C) (Oral)   Resp 16   SpO2 98%  Physical Exam Vitals and nursing note reviewed.  Constitutional:      General: He is not in acute distress. HENT:     Head: Normocephalic.     Nose: Nose normal.     Mouth/Throat:     Mouth: Mucous membranes are moist.  Eyes:     Conjunctiva/sclera: Conjunctivae normal.  Cardiovascular:     Rate and Rhythm: Normal rate and regular rhythm.  Pulmonary:     Effort: Pulmonary effort is normal.     Breath sounds: Normal breath sounds.  Abdominal:     General: Abdomen is flat. There is no distension.      Tenderness: There is no abdominal tenderness. There is no guarding or rebound.  Musculoskeletal:     Right lower leg: No edema.     Left lower leg: No edema.  Skin:    General: Skin is warm and dry.     Capillary Refill: Capillary refill takes less than 2 seconds.  Neurological:     Mental Status: He is alert and oriented to person, place, and time.  Psychiatric:        Mood and Affect: Mood normal.        Behavior: Behavior normal.     ED Results / Procedures / Treatments   Labs (all labs ordered are listed, but only abnormal results are displayed) Labs Reviewed  CBC - Abnormal; Notable for the following components:      Result Value   WBC 10.8 (*)    Hemoglobin 12.2 (*)    HCT 37.7 (*)    RDW 16.0 (*)    All other components within normal limits  COMPREHENSIVE METABOLIC PANEL - Abnormal; Notable for the following components:   Sodium 128 (*)    Chloride 94 (*)    Glucose, Bld 315 (*)    BUN 27 (*)    Creatinine, Ser 1.38 (*)    Albumin 2.8 (*)    Total Bilirubin 1.4 (*)    GFR, Estimated 51 (*)    All other components within normal limits  BASIC METABOLIC PANEL  BASIC METABOLIC PANEL  CBC    EKG None  Radiology CT ABDOMEN WO CONTRAST Result Date: 03/03/2024 CLINICAL DATA:  Acute calculus cholecystitis, post cholecystostomy catheter placement 08/17/2022, with inadvertent removal last month EXAM: CT ABDOMEN WITHOUT CONTRAST TECHNIQUE: Multidetector CT imaging of the abdomen was performed following the standard protocol without IV contrast. RADIATION DOSE REDUCTION: This exam was performed according to the departmental dose-optimization program which includes automated exposure control, adjustment of the mA and/or kV according to patient size and/or use of iterative reconstruction technique. COMPARISON:  02/28/2024 FINDINGS: Lower chest: No pleural or pericardial effusion. Minimal linear scarring or atelectasis posteriorly in the lung bases, with moderate degradation from  breathing motion. Hepatobiliary: No liver lesion or biliary ductal dilatation. The gallbladder is incompletely distended, with mild wall thickening. Punctate calcific densities layer in its dependent portion. 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 Pancreas: Mild atrophy with scattered coarse calcifications in the head and midbody suggesting chronic pancreatitis. No regional inflammatory change. Spleen: Normal in size without focal abnormality. Adrenals/Urinary Tract: No adrenal mass. No urolithiasis or hydronephrosis. 9.9 cm cyst from the lower pole right kidney, with small peripheral calcifications as before.  Stomach/Bowel: Stomach is incompletely distended. Visualized loops of small bowel decompressed. Normal appendix. Visualized colon is incompletely distended, without acute finding. Vascular/Lymphatic: Moderate calcified aortic plaque without aneurysm. No retroperitoneal or mesenteric adenopathy. Other: No abdominal ascites.  No free air. Musculoskeletal: No acute or significant osseous findings. IMPRESSION: 1. 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 2. Cholelithiasis with mild gallbladder wall thickening, gallbladder incompletely distended. 3. Chronic pancreatitis. Electronically Signed   By: Corlis Leak M.D.   On: 03/03/2024 15:24    Procedures Procedures    Medications Ordered in ED Medications  aspirin chewable tablet 81 mg (has no administration in time range)  atorvastatin (LIPITOR) tablet 40 mg (has no administration in time range)  insulin glargine (LANTUS) injection 15 Units (has no administration in time range)  pantoprazole (PROTONIX) EC tablet 40 mg (has no administration in time range)  clopidogrel (PLAVIX) tablet 75 mg (has no administration  in time range)  enoxaparin (LOVENOX) injection 40 mg (40 mg Subcutaneous Given 03/03/24 1626)  insulin aspart (novoLOG) injection 0-15 Units (has no administration in time range)  insulin aspart (novoLOG) injection 0-5 Units (has no administration in time range)  acetaminophen (TYLENOL) tablet 650 mg (has no administration in time range)    Or  acetaminophen (TYLENOL) suppository 650 mg (has no administration in time range)  ondansetron (ZOFRAN) tablet 4 mg (has no administration in time range)    Or  ondansetron (ZOFRAN) injection 4 mg (has no administration in time range)  0.9 %  sodium chloride infusion ( Intravenous New Bag/Given 03/03/24 1626)  senna-docusate (Senokot-S) tablet 1 tablet (has no administration in time range)  albuterol (PROVENTIL) (2.5 MG/3ML) 0.083% nebulizer solution 2.5 mg (has no administration in time range)    ED Course/ Medical Decision Making/ A&P Clinical Course as of 03/03/24 1741  Tue Mar 03, 2024  0856 Seen in ED 2/28 for when drain removed. Plan was to follow with IR.  [TY]  0951 Was admitted 08/18/23 with a note and there discharge summary that the biliary drain has been present for over a year [TY]  0954 CBC(!) Similar to prior.  White count is downtrending.  Systemic infection less likely [TY]    Clinical Course User Index [TY] Coral Spikes, DO                                 Medical Decision Making Is a 84 year old male presenting emergency department for biliary drain placement.  Afebrile vital signs reassuring.  On exam benign abdomen.  CT course for chart review.  Reached out to IR.  Obtain basic labs that showed hyponatremia.  Plan to admit for observation for IV hydration as I suspect secondary to hypovolemic.  Hopefully, IR can place drain inpatient.  Discussed care with hospitalist agrees to see and admit patient.  Amount and/or Complexity of Data Reviewed Independent Historian:     Details: Son notes drain was pulled out accidentally on  2/28 External Data Reviewed:     Details: See ED course Labs: ordered. Decision-making details documented in ED Course.  Risk Decision regarding hospitalization.         Final Clinical Impression(s) / ED Diagnoses Final diagnoses:  None    Rx / DC Orders ED Discharge Orders     None         Ellianna Ruest,  Harmon Dun, DO 03/03/24 [phone]

## 2024-03-03 NOTE — H&P (Signed)
 History and Physical  Andre Stout FIE:[id number] DOB: [birth year] DOA: 03/03/2024  PCP: Olive Bass, FNP   Chief Complaint: Cholecystostomy tube fell out  HPI: Andre Stout is a 84 y.o. 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  It seems that his cholecystostomy tube fell out last week, the tube remained out with a plan for IR tube exchange today 3/4.  When he presented as planned, he was sent to the ER for evaluation.  IR requested surgical evaluation for potential cholecystectomy, he was seen by surgery today in the emergency department but they recommend replacement of the cholecystostomy tube, and outpatient cardiology as well as neurology evaluation given his history of TIA prior to elective cholecystectomy.  Patient denies any abdominal pain, fevers, nausea, vomiting or other complaints.  Seems that for the last few days he has not been eating or drinking very much, has some nausea.  Review of Systems: Please see HPI for pertinent positives and negatives. A complete 10 system review of systems are otherwise negative.  Past Medical History:  Diagnosis Date   Diabetes mellitus without complication (HCC)    Glaucoma    Heart block    complete heart block   Hypertension    Prostate cancer (HCC)    Been 3-4 years ago   Past Surgical History:  Procedure Laterality Date   CATARACT EXTRACTION Bilateral    INSERTION PROSTATE RADIATION SEED     IR EXCHANGE BILIARY DRAIN  08/19/2023   IR EXCHANGE BILIARY DRAIN  12/19/2023   IR EXCHANGE BILIARY DRAIN  01/20/2024   IR PERC CHOLECYSTOSTOMY  08/17/2022   IR RADIOLOGIST EVAL & MGMT  09/28/2022   IR RADIOLOGIST EVAL & MGMT  10/10/2022   Social History:  reports that he quit smoking about 49 years ago. His smoking use included cigarettes. He started smoking about 53 years ago. He has a 2 pack-year  smoking history. He has never used smokeless tobacco. He reports that he does not drink alcohol and does not use drugs.  No Known Allergies  Family History  Problem Relation Age of Onset   Other Mother        unknown medical history   Diabetes Father    Diabetes Paternal Grandfather      Prior to Admission medications   Medication Sig Start Date End Date Taking? Authorizing Provider  acetaminophen (TYLENOL) 500 MG tablet Take 1,000 mg by mouth daily as needed (pain).    [provider]  amLODipine (NORVASC) 10 MG tablet Take 1 tablet (10 mg total) by mouth daily. 11/15/23   Olive Bass, FNP  aspirin 81 MG chewable tablet Chew 81 mg by mouth daily. 01/06/22   [provider]  atorvastatin (LIPITOR) 40 MG tablet Take 1 tablet (40 mg total) by mouth daily. 11/15/23   Olive Bass, FNP  Azelastine HCl 137 MCG/SPRAY SOLN PLACE 1 SPRAY INTO BOTH NOSTRILS 2 (TWO) TIMES DAILY. USE IN [facility] NOSTRIL AS DIRECTED 02/28/24   Drubel, Lillia Abed, PA-C  benzonatate (TESSALON) 100 MG capsule Take 1 capsule (100 mg total) by mouth 3 (three) times daily as needed for cough. 12/17/23   Olive Bass, FNP  Blood Glucose Monitoring Suppl (ACCU-CHEK GUIDE) w/Device KIT Use As Directed 08/24/22   Lanae Boast, MD  brimonidine (ALPHAGAN) 0.2 % ophthalmic solution Place 1 drop into both eyes 2 (two) times daily with breakfast and lunch.  12/18/21   [provider]  clopidogrel (PLAVIX) 75 MG tablet Take 1 tablet (75 mg total) by mouth daily. 11/15/23   Olive Bass, FNP  Continuous Blood Gluc Receiver (FREESTYLE LIBRE 3 READER) DEVI 1 each by Does not apply route daily. 01/22/23   Carlus Pavlov, MD  Continuous Blood Gluc Sensor (FREESTYLE LIBRE 3 SENSOR) MISC 1 each by Does not apply route every 14 (fourteen) days. 01/22/23   Carlus Pavlov, MD  dorzolamide-timolol (COSOPT) 22.3-6.8 MG/ML ophthalmic solution Place 1 drop into both eyes 2 (two) times daily  with breakfast and lunch. 05/18/22   [provider]  insulin aspart protamine - aspart (NOVOLOG 70/30 FLEXPEN) (70-30) 100 UNIT/ML FlexPen 30 units before b'fast 20 units before lunch 15 units before dinner Take the insulin 10-15 min before meals. 11/15/23   Carlus Pavlov, MD  Insulin Pen Needle 32G X 4 MM MISC Use to inject insulin 3 times a day 11/15/23   Carlus Pavlov, MD  Insulin Syringe-Needle U-100 (BD INSULIN SYRINGE U/F) 31G X 5/16" 1 ML MISC USE 2 DAILY 11/21/22   Carlus Pavlov, MD  latanoprost (XALATAN) 0.005 % ophthalmic solution Place 1 drop into both eyes at bedtime. 01/01/22   [provider]  magnesium oxide (MAG-OX) 400 (240 Mg) MG tablet TAKE 1 TABLET BY MOUTH TWICE A DAY 02/28/24   Olive Bass, FNP  Multiple Vitamins-Minerals (PRESERVISION AREDS 2) CAPS Take 1 capsule by mouth at bedtime.    [provider]  ondansetron (ZOFRAN) 4 MG tablet Take 4 mg by mouth every 8 (eight) hours as needed. 12/01/23   [provider]  Dola Argyle LANCETS 33G MISC Use to check blood sugar 4 times per day. Dx code: E11.9 03/27/16   Romero Belling, MD  [facility] ULTRA test strip USE TO MONITOR GLUCOSE LEVELS 4 TIMES PER DAY E11.9 06/10/19   Romero Belling, MD  pantoprazole (PROTONIX) 40 MG tablet Take 1 tablet (40 mg total) by mouth 2 (two) times daily before a meal. 11/15/23   Olive Bass, FNP  sodium chloride flush 0.9 % SOLN injection Inject 5 mLs into the vein as needed for up to 30 doses. Discard remainder of syringe. Patient not taking: Reported on 01/17/2024 08/24/22   Lanae Boast, MD    Physical Exam: BP 104/68 (BP Location: Right Arm)   Pulse 70   Temp 98 F (36.7 C) (Oral)   Resp 16   SpO2 96%  General:  Alert, oriented, calm, in no acute distress, patient is incredibly hard of hearing Cardiovascular: RRR, no murmurs or rubs, no peripheral edema  Respiratory: clear to auscultation bilaterally, no wheezes, no crackles   Abdomen: soft, tender RUQ, nondistended, normal bowel tones heard  Skin: dry, no rashes  Musculoskeletal: no joint effusions, normal range of motion  Psychiatric: appropriate affect, normal speech  Neurologic: extraocular muscles intact, clear speech, moving all extremities with intact sensorium         Labs on Admission:  Basic Metabolic Panel: Recent Labs  Lab 02/28/24 2244 03/03/24 0935  NA 132* 128*  K 3.8 4.5  CL 95* 94*  CO2 25 25  GLUCOSE 118* 315*  BUN 48* 27*  CREATININE 1.70* 1.38*  CALCIUM 10.5* 9.3   Liver Function Tests: Recent Labs  Lab 02/28/24 2244 03/03/24 0935  AST 32 17  ALT 28 14  ALKPHOS 75 66  BILITOT 1.0 1.4*  PROT 8.6* 6.9  ALBUMIN 3.6 2.8*   Recent Labs  Lab 02/28/24 2244  LIPASE 20   No results for input(s): "AMMONIA" in the last 168 hours. CBC: Recent Labs  Lab 02/28/24 2244 03/03/24 0935  WBC 13.4* 10.8*  NEUTROABS 9.5*  --   HGB 14.0 12.2*  HCT 43.5 37.7*  MCV 82.7 83.2  PLT 452* 325   Cardiac Enzymes: No results for input(s): "CKTOTAL", "CKMB", "CKMBINDEX", "TROPONINI" in the last 168 hours. BNP (last 3 results) No results for input(s): "BNP" in the last 8760 hours.  ProBNP (last 3 results) No results for input(s): "PROBNP" in the last 8760 hours.  CBG: Recent Labs  Lab 02/29/24 0021 02/29/24 0217  GLUCAP 100* 157*    Radiological Exams on Admission: No results found. Assessment/Plan Andre Stout is a 84 y.o. 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   Hyponatremia-patient does look dry and exam, I suspect this is a hypovolemic hyponatremia from reduced oral intake due to abdominal discomfort/mild nausea as his biliary drain has fallen out.  There is also component of pseudohyponatremia as his blood sugar is 315, this corrects to 131.  However his baseline appears to be in  the normal range. -Observation admission -Hydrate with normal saline at 100 cc/h -Recheck BMP this evening, and again in the morning  Insulin-dependent type 2 diabetes-recent hospitalization in January for DKA.  Currently, blood sugars elevated over 300.  Sugars noted to be labile, he was discharged on NovoLog 70/30 30 units before breakfast, 20 units before lunch, and 15 units before dinner. -Given his labile blood sugars and need to be n.p.o. for IR tube placement we will try and simplify his regimen in the hospital -Basal insulin Lantus 15 units twice daily, with moderate dose sliding scale -Anticipate this will need to be titrated upwards, as his abdominal discomfort and nausea will subside, and his p.o. intake improves  GERD-Protonix  Acute cholecystitis-not a great surgical candidate given his complete heart block, as well as history of TIA.  He has never had formal neurology evaluation for this. -Anticipate replacement of IR cholecystostomy tube today -Appreciate general surgery consultation, they are recommending outpatient cardiology and neurology evaluation  Hyperlipidemia-Lipitor  DVT prophylaxis: Lovenox     Code Status: Full Code  Consults called: General Surgery  Admission status: Observation  Time spent: 58 minutes  Jane Broughton Sharlette Dense MD [facility] Pager [phone]  If 7PM-7AM, please contact night-coverage [email or web address] Password [email or web address]  03/03/2024, 1:28 PM

## 2024-03-03 NOTE — ED Triage Notes (Signed)
 Pt with family member who reports that he was recently seen for accidentally pulling out his biliary drain. They were referred outpatient for replacement, but at scheduled appointment today they were referred back to ED for replacement as it could not be done where they were.

## 2024-03-03 NOTE — Consult Note (Signed)
 Andre Stout [birth year]  [id number].    Requesting MD: Bethena Midget Chief Complaint/Reason for Consult: history of cholecystitis and cholecystostomy tube.   HPI:  84 y/o M with history of HTN, DM, prostate cancer, TIA, syncopal events, complete heart block, and cholecystitis s/p percutaneous cholecystectomy tube initially placed 07/2022 who presents because his cholecystostomy tube came out. He originally got a cholecystostomy tube because he was found to have complete heart block and our surgeons did not think he would be clear from a cardiac perspective for surgery. He followed up with Dr. Dwain Stout in the office 10/12/2022 and underwent capping trial because study of perc chole showed patent cystic duct. He does still have gallstones. Dr. Dwain Stout  recommended outpatient cardiology follow up for pre-operative risk evaluation. It appears the patient has not received cardiac clearance/eval. He was last seen by cardiology 10/2022 and at the time was stable but thought he would eventually need a PPM. I do not see any documentation of perioperative evaluation. The patient/son are unable to tell me if this has occurred. Patient also was diagnosed with TIA 09/2022 and started on plavix, given referral to neurology, but did not follow up.   Since his initial cholecystostomy tube placement he has had multiple tube studies and exchanges. He tells me that his tube fell out last week 2/28 and they were told it could remain out until his previously scheduled appointment with IR today 3/4 for exchange. When he arrived for his appointment he was directed to the ED. We are asked to evaluate the patient to determine if the tube can remain out or if he needs it replaced.   Patient denies abdominal pain, nausea, or vomiting. Reports poor PO intake. Per son the patient has has worse functional status lately and walks using a walker, gets HH PT.   ROS: Review of Systems  All other systems reviewed and are  negative.   Family History  Problem Relation Age of Onset   Other Mother        unknown medical history   Diabetes Father    Diabetes Paternal Grandfather     Past Medical History:  Diagnosis Date   Diabetes mellitus without complication (HCC)    Glaucoma    Heart block    complete heart block   Hypertension    Prostate cancer (HCC)    Been 3-4 years ago    Past Surgical History:  Procedure Laterality Date   CATARACT EXTRACTION Bilateral    INSERTION PROSTATE RADIATION SEED     IR EXCHANGE BILIARY DRAIN  08/19/2023   IR EXCHANGE BILIARY DRAIN  12/19/2023   IR EXCHANGE BILIARY DRAIN  01/20/2024   IR PERC CHOLECYSTOSTOMY  08/17/2022   IR RADIOLOGIST EVAL & MGMT  09/28/2022   IR RADIOLOGIST EVAL & MGMT  10/10/2022    Social History:  reports that he quit smoking about 49 years ago. His smoking use included cigarettes. He started smoking about 53 years ago. He has a 2 pack-year smoking history. He has never used smokeless tobacco. He reports that he does not drink alcohol and does not use drugs.  Allergies: No Known Allergies  (Not in a hospital admission)   Physical Exam: Blood pressure 104/68, pulse 70, temperature 98 F (36.7 C), temperature source Oral, resp. rate 16, SpO2 96%. General: chronically ill appearing black male, laying on hospital bed, appears stated age, NAD. Very hard of hearing  HEENT: head -normocephalic, atraumatic; Eyes: PERRLA, no conjunctival injection; anicteric sclerae. Nose:  nonerythematous, no polyps/masses; Throat: pink mucosa, uvula midline, no exudates.  Neck- Trachea is midline, no thyromegaly or JVD appreciated.  CV- RRR, normal S1/S2, no M/R/G, no lower extremity edema  Pulm- breathing is non-labored. Abd- soft, nontender, RUQ dressing saturated with purulent, bile-stained fluid. No cellulitis.  GU- deferred  MSK- UE/LE symmetrical, no cyanosis, clubbing, or edema. Neuro- CN II-XII grossly in tact, no paresthesias. Psych- Alert and  Oriented x3 with appropriate affect Skin: warm and dry, no rashes or lesions   Results for orders placed or performed during the hospital encounter of 03/03/24 (from the past 48 hours)  CBC     Status: Abnormal   Collection Time: 03/03/24  9:35 AM  Result Value Ref Range   WBC 10.8 (H) 4.0 - 10.5 K/uL   RBC 4.53 4.22 - 5.81 MIL/uL   Hemoglobin 12.2 (L) 13.0 - 17.0 g/dL   HCT 28.4 (L) 13.2 - 44.0 %   MCV 83.2 80.0 - 100.0 fL   MCH 26.9 26.0 - 34.0 pg   MCHC 32.4 30.0 - 36.0 g/dL   RDW 10.2 (H) 72.5 - 36.6 %   Platelets 325 150 - 400 K/uL   nRBC 0.0 0.0 - 0.2 %    Comment: Performed at [facility], [address] [place], [place] [address]  Comprehensive metabolic panel     Status: Abnormal   Collection Time: 03/03/24  9:35 AM  Result Value Ref Range   Sodium 128 (L) 135 - 145 mmol/L   Potassium 4.5 3.5 - 5.1 mmol/L   Chloride 94 (L) 98 - 111 mmol/L   CO2 25 22 - 32 mmol/L   Glucose, Bld 315 (H) 70 - 99 mg/dL    Comment: Glucose reference range applies only to samples taken after fasting for at least 8 hours.   BUN 27 (H) 8 - 23 mg/dL   Creatinine, Ser 7.42 (H) 0.61 - 1.24 mg/dL   Calcium 9.3 8.9 - 59.5 mg/dL   Total Protein 6.9 6.5 - 8.1 g/dL   Albumin 2.8 (L) 3.5 - 5.0 g/dL   AST 17 15 - 41 U/L   ALT 14 0 - 44 U/L   Alkaline Phosphatase 66 38 - 126 U/L   Total Bilirubin 1.4 (H) 0.0 - 1.2 mg/dL   GFR, Estimated 51 (L) >60 mL/min    Comment: (NOTE) Calculated using the CKD-EPI Creatinine Equation (2021)    Anion gap 9 5 - 15    Comment: Performed at [facility], [address] [place], [place] [address]   No results found.    Assessment/Plan 84 y/o M with MMP including, not limited to, TIA, syncope, complete heart block, and calculous cholecystitis managed with cholecystostomy tube who presents because his tube fell out. He does not report RUQ abdominal pain or fever. He is hemodynamically stable, WBC is 10.8, bili is 1.4 and  other LFTs are normal. He has active bile and purulent drainage from his abdominal wall where his drain was. I do think he has ongoing evidence of cholecystitis. He has not had a recent evaluation by cardiology or neurology and needs this before he will be considered for cholecystectomy. I recommend IR consult for replacement of cholecystostomy tube. I have ordered this and let IR know my recommendations. Once tube is replaced and he has received outpatient cardiac clearance for surgery, he can follow up in our office with Dr. Dwain Stout. May also need outpatient neuro eval prior to OR, I will discuss with  Dr. Dwain Stout.  Plan of care reviewed with my attending.    I reviewed nursing notes, ED provider notes, Consultant cardiology/hospitalist notes, last 24 h vitals and pain scores, last 48 h intake and output, last 24 h labs and trends, and last 24 h imaging results. Reviewed notes from previous admissions and outpatient office and telephone encounters  Adam Phenix, [facility] 03/03/2024, 11:24 AM Please see Amion for pager number during day hours 7:00am-4:30pm or 7:00am -11:30am on weekends

## 2024-03-04 DIAGNOSIS — K219 Gastro-esophageal reflux disease without esophagitis: Secondary | ICD-10-CM

## 2024-03-04 DIAGNOSIS — K632 Fistula of intestine: Secondary | ICD-10-CM

## 2024-03-04 DIAGNOSIS — Z8679 Personal history of other diseases of the circulatory system: Secondary | ICD-10-CM

## 2024-03-04 DIAGNOSIS — E782 Mixed hyperlipidemia: Secondary | ICD-10-CM

## 2024-03-04 DIAGNOSIS — Z7901 Long term (current) use of anticoagulants: Secondary | ICD-10-CM | POA: Diagnosis not present

## 2024-03-04 DIAGNOSIS — E1165 Type 2 diabetes mellitus with hyperglycemia: Secondary | ICD-10-CM

## 2024-03-04 DIAGNOSIS — I441 Atrioventricular block, second degree: Secondary | ICD-10-CM | POA: Diagnosis not present

## 2024-03-04 DIAGNOSIS — Z87891 Personal history of nicotine dependence: Secondary | ICD-10-CM | POA: Diagnosis not present

## 2024-03-04 DIAGNOSIS — Z01810 Encounter for preprocedural cardiovascular examination: Secondary | ICD-10-CM

## 2024-03-04 DIAGNOSIS — E871 Hypo-osmolality and hyponatremia: Secondary | ICD-10-CM | POA: Diagnosis not present

## 2024-03-04 DIAGNOSIS — T85520A Displacement of bile duct prosthesis, initial encounter: Secondary | ICD-10-CM | POA: Diagnosis not present

## 2024-03-04 DIAGNOSIS — Z794 Long term (current) use of insulin: Secondary | ICD-10-CM

## 2024-03-04 DIAGNOSIS — Z8673 Personal history of transient ischemic attack (TIA), and cerebral infarction without residual deficits: Secondary | ICD-10-CM | POA: Diagnosis not present

## 2024-03-04 DIAGNOSIS — E1169 Type 2 diabetes mellitus with other specified complication: Secondary | ICD-10-CM

## 2024-03-04 LAB — COMPREHENSIVE METABOLIC PANEL
ALT: 13 U/L (ref 0–44)
AST: 16 U/L (ref 15–41)
Albumin: 2.9 g/dL — ABNORMAL LOW (ref 3.5–5.0)
Alkaline Phosphatase: 62 U/L (ref 38–126)
Anion gap: 13 (ref 5–15)
BUN: 21 mg/dL (ref 8–23)
CO2: 23 mmol/L (ref 22–32)
Calcium: 9.4 mg/dL (ref 8.9–10.3)
Chloride: 100 mmol/L (ref 98–111)
Creatinine, Ser: 1.03 mg/dL (ref 0.61–1.24)
GFR, Estimated: >60 mL/min (ref >60)
Glucose, Bld: 247 mg/dL — ABNORMAL HIGH (ref 70–99)
Potassium: 4 mmol/L (ref 3.5–5.1)
Sodium: 136 mmol/L (ref 135–145)
Total Bilirubin: 1.7 mg/dL — ABNORMAL HIGH (ref 0.0–1.2)
Total Protein: 7 g/dL (ref 6.5–8.1)

## 2024-03-04 LAB — CBC WITH DIFFERENTIAL/PLATELET
Abs Immature Granulocytes: 0.03 10*3/uL (ref 0.00–0.07)
Basophils Absolute: 0 10*3/uL (ref 0.0–0.1)
Basophils Relative: 1 %
Eosinophils Absolute: 0.1 10*3/uL (ref 0.0–0.5)
Eosinophils Relative: 2 %
HCT: 41.6 % (ref 39.0–52.0)
Hemoglobin: 12.7 g/dL — ABNORMAL LOW (ref 13.0–17.0)
Immature Granulocytes: 0 %
Lymphocytes Relative: 21 %
Lymphs Abs: 1.6 10*3/uL (ref 0.7–4.0)
MCH: 26 pg (ref 26.0–34.0)
MCHC: 30.5 g/dL (ref 30.0–36.0)
MCV: 85.1 fL (ref 80.0–100.0)
Monocytes Absolute: 0.8 10*3/uL (ref 0.1–1.0)
Monocytes Relative: 10 %
Neutro Abs: 5 10*3/uL (ref 1.7–7.7)
Neutrophils Relative %: 66 %
Platelets: 359 10*3/uL (ref 150–400)
RBC: 4.89 MIL/uL (ref 4.22–5.81)
RDW: 16 % — ABNORMAL HIGH (ref 11.5–15.5)
WBC: 7.6 10*3/uL (ref 4.0–10.5)
nRBC: 0 % (ref 0.0–0.2)

## 2024-03-04 LAB — GLUCOSE, CAPILLARY
Glucose-Capillary: 241 mg/dL — ABNORMAL HIGH (ref 70–99)
Glucose-Capillary: 227 mg/dL — ABNORMAL HIGH (ref 70–99)
Glucose-Capillary: 150 mg/dL — ABNORMAL HIGH (ref 70–99)
Glucose-Capillary: 340 mg/dL — ABNORMAL HIGH (ref 70–99)

## 2024-03-04 LAB — MAGNESIUM: Magnesium: 1.6 mg/dL — ABNORMAL LOW (ref 1.7–2.4)

## 2024-03-04 NOTE — Progress Notes (Signed)
 [facility] Progress Note     Subjective: CC:  Resting comfortably. Denies abdominal pain or vomiting.   Objective: Vital signs in last 24 hours: Temp:  [97.4 F (36.3 C)-98.6 F (37 C)] 97.6 F (36.4 C) (03/05 0601) Pulse Rate:  [62-70] 62 (03/05 0601) Resp:  [14-18] 18 (03/05 0601) BP: (104-129)/(58-79) 119/62 (03/05 0601) SpO2:  [96 %-100 %] 99 % (03/05 0601) Weight:  [77.1 kg] 77.1 kg (03/04 2250) Last BM Date : 03/02/24  Intake/Output from previous day: 03/04 0701 - 03/05 0700 In: 0  Out: 100 [Urine:100] Intake/Output this shift: No intake/output data recorded.  PE: Gen:  Alert, NAD, chronically ill appearing Card:  Regular rate and rhythm.  Pulm:  Normal effort Abd: Soft, non-tender, non-distended, RUQ qith ABD pad saturated with purulent drainage - consistent with known cholecystocutaneous fistula  Skin: warm and dry, no rashes   Lab Results:  Recent Labs    03/03/24 0935  WBC 10.8*  HGB 12.2*  HCT 37.7*  PLT 325   BMET Recent Labs    03/03/24 0935 03/03/24 2115  NA 128* 133*  K 4.5 4.1  CL 94* 101  CO2 25 21*  GLUCOSE 315* 156*  BUN 27* 24*  CREATININE 1.38* 1.03  CALCIUM 9.3 9.2   PT/INR No results for input(s): "LABPROT", "INR" in the last 72 hours. CMP     Component Value Date/Time   NA 133 (L) 03/03/2024 2115   K 4.1 03/03/2024 2115   CL 101 03/03/2024 2115   CO2 21 (L) 03/03/2024 2115   GLUCOSE 156 (H) 03/03/2024 2115   BUN 24 (H) 03/03/2024 2115   CREATININE 1.03 03/03/2024 2115   CREATININE 1.11 01/12/2022 1541   CALCIUM 9.2 03/03/2024 2115   PROT 6.9 03/03/2024 0935   ALBUMIN 2.8 (L) 03/03/2024 0935   AST 17 03/03/2024 0935   ALT 14 03/03/2024 0935   ALKPHOS 66 03/03/2024 0935   BILITOT 1.4 (H) 03/03/2024 0935   GFRNONAA >60 03/03/2024 2115   GFRAA >60 03/07/2016 0337   Lipase     Component Value Date/Time   LIPASE 20 02/28/2024 2244       Studies/Results: CT ABDOMEN WO CONTRAST Result Date:  03/03/2024 CLINICAL DATA:  Acute calculus cholecystitis, post cholecystostomy catheter placement 08/17/2022, with inadvertent removal last month EXAM: CT ABDOMEN WITHOUT CONTRAST TECHNIQUE: Multidetector CT imaging of the abdomen was performed following the standard protocol without IV contrast. RADIATION DOSE REDUCTION: This exam was performed according to the departmental dose-optimization program which includes automated exposure control, adjustment of the mA and/or kV according to patient size and/or use of iterative reconstruction technique. COMPARISON:  02/28/2024 FINDINGS: Lower chest: No pleural or pericardial effusion. Minimal linear scarring or atelectasis posteriorly in the lung bases, with moderate degradation from breathing motion. Hepatobiliary: No liver lesion or biliary ductal dilatation. The gallbladder is incompletely distended, with mild wall thickening. Punctate calcific densities layer in its dependent portion. 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 Pancreas: Mild atrophy with scattered coarse calcifications in the head and midbody suggesting chronic pancreatitis. No regional inflammatory change. Spleen: Normal in size without focal abnormality. Adrenals/Urinary Tract: No adrenal mass. No urolithiasis or hydronephrosis. 9.9 cm cyst from the lower pole right kidney, with small peripheral calcifications as before. Stomach/Bowel: Stomach is incompletely distended. Visualized loops of small bowel decompressed. Normal appendix. Visualized colon is incompletely distended, without acute finding. Vascular/Lymphatic: Moderate  calcified aortic plaque without aneurysm. No retroperitoneal or mesenteric adenopathy. Other: No abdominal ascites.  No free air. Musculoskeletal: No acute or significant osseous findings. IMPRESSION: 1. Thick-walled fistula along the tract of the previous  cholecystostomy catheter from the fundus of the gallbladder into the right upper quadrant abdominal body wall. 2. Cholelithiasis with mild gallbladder wall thickening, gallbladder incompletely distended. 3. Chronic pancreatitis. Electronically Signed   By: Corlis Leak M.D.   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   Cardiology has been consulted for peri-operative risk evaluation.  Ultimately he would benefit from cholecystectomy if he is an acceptable risk but, given his age, heart block, history of TIA, and physical deconditioning I do have concerns about him being high risk. Will await cards eval.     LOS: 0 days   I reviewed nursing notes, hospitalist notes, last 24 h vitals and pain scores, last 48 h intake and output, last 24 h labs and trends, and last 24 h imaging results.  This care required moderate level of medical decision making.   Hosie Spangle, PA-C [facility] Please see Amion for pager number during day hours 7:00am-4:30pm

## 2024-03-04 NOTE — Hospital Course (Signed)
 84 y.o. 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  Patient presented to [facility] 1 week after cholecystostomy tube fell out with progressively worsening weakness poor oral intake and nausea.    Of note, patient was recently hospitalized 01/17/2024 with DKA and coagulase negative as well as group B strep bacteremia.  Patient clinically improved and prior to discharge repeat blood cultures documented clearance of bacteremia before the patient was discharged home on oral amoxicillin.  Prescription was called to assess the patient for admission to the hospital for dehydration with hyponatremia.    Patient was hydrated gently with intravenous isotonic fluids with serial chemistries.  Hyponatremia is felt to be exacerbated by concurrent hyperglycemia due to poorly controlled diabetes mellitus.  Basal bolus insulin therapy was titrated throughout the hospitalization in addition to sliding scale insulin to achieve glycemic control.  Due to the patient's recently dislodged cholecystostomy tube, general surgery was consulted.  They initially recommended IR evaluation for possible tube replacement however upon further discussion they then recommended cardiology consultation for cardiac clearance for potential surgical intervention.  All the patient did receive clearance for proceeding with surgery after a long discussion with the family about the risks and benefits of the surgery the patient/family opted not to proceed with cholecystectomy.  Case was then discussed again with interventional radiology at length.  Because of the colocutaneous fistula that has matured and the axonal dislodgment of the last drain they felt that inserting the another drain would not prove effective and likely leak.  They instead  recommended treating the fistula as an ostomy and managing with an ostomy bag on a regular basis.  Arranges were made for the patient to receive ostomy education and ostomy supplies at time of discharge with close follow-up with ostomy clinic.  Per discussions with surgery, they recommend close monitoring of the ostomy output and if the ostomy ever does spontaneously close, which is unlikely, patient would be at risk of recurrent cholecystitis and may need to be reevaluated by surgery at that time.  Patient is being discharged home in improved and stable condition on 03/05/2024.

## 2024-03-04 NOTE — Inpatient Diabetes Management (Signed)
 Inpatient Diabetes Program Recommendations  AACE/ADA: New Consensus Statement on Inpatient Glycemic Control (2015)  Target Ranges:  Prepandial:   less than 140 mg/dL      Peak postprandial:   less than 180 mg/dL (1-2 hours)      Critically ill patients:  140 - 180 mg/dL   Lab Results  Component Value Date   GLUCAP 241 (H) 03/04/2024   HGBA1C 10.7 (A) 11/14/2023    Review of Glycemic Control  Diabetes history: DM2 Outpatient Diabetes medications: Novolog 70/30 30 units in am, 20 units at lunch and 15 units at dinner Current orders for Inpatient glycemic control: Lantus 15 BID, Novolog 0-15 TID with meals and 0-5 HS  HgbA1C - 10.7%  Inpatient Diabetes Program Recommendations:    Increase Lantus to 18 units BID  Will need meal coverage when taking po's.  Continue to follow.  Thank you. Ailene Ards, RD, LDN, CDCES Inpatient Diabetes Coordinator [phone]

## 2024-03-04 NOTE — Care Management Obs Status (Signed)
 MEDICARE OBSERVATION STATUS NOTIFICATION   Patient Details  Name: Andre Stout MRN: [id number] Date of Birth: [birth year]   Medicare Observation Status Notification Given:  Yes    Howell Rucks, RN 03/04/2024, 1:24 PM

## 2024-03-04 NOTE — Consult Note (Signed)
 WOC Nurse Consult Note: Reason for Consult: per IR notes, patient is not having IR drain replaced in the RUQ and wanted pouch placed over former drain site Wound type: somatized drain site Pressure Injury POA: NA Measurement: pinpoint site, less than 0.1cm  Wound bed: NA Drainage (amount, consistency, odor) clear, yellowish Periwound: intact  Dressing procedure/placement/frequency: Placed small ring of ostomy barrier around opening, placed 1pc flat ostomy pouch [facility] [place] # [id number]). Made staff aware of addition of ostomy pouch.  Re consult if needed, will not follow at this time. Thanks  Marissah Vandemark [employer], RN,CWOCN, CNS, CWON-AP [phone])

## 2024-03-04 NOTE — Consult Note (Addendum)
 Cardiology Consultation   Patient ID: Andre Stout MRN: [id number]; DOB: 20-Jul-1940  Admit date: 03/03/2024 Date of Consult: 03/04/2024  PCP:  Olive Bass, FNP    HeartCare Providers Cardiologist:  Reatha Harps, MD  Electrophysiologist:  Lewayne Bunting, MD       Patient Profile:   Andre Stout is a 84 y.o. male with a hx of TIA, intermittent complete heart block previously seen by EP, cholecystitis status post cholecystomy tube, prior tobacco use, hyponatremia, 2 diabetes requiring insulin, hyperlipidemia, and GERD who is being seen 03/04/2024 for the evaluation of heart block and surgical clearance at the request of Shauna Hugh MD.  History of Present Illness:  Prior cardiac history Was previously hospitalized for a syncopal event on 07/2022 Was last seen in clinic by Dr. Ladona Ridgel on 10/2022 for the syncope and intermittent complete heart block with rates in 55-60 range.  He also had a baseline first-degree AV block.  Was placed on a cardiac monitor.  The cardiac monitor interpretation on 11/2022 showed baseline first-degree AV block.  One run of V. tach that lasted 7 beats.  Symptomatic Second-degree AV block Mobitz 1,  complete heart block with a junctional escape in 55-60 range. These heart block episodes happened at, 10/17/22 at 8:39 am and 7:29 pm, 10/22/22 at 3:35 am, 10/29/22 at 5:59 am. At that time it was decided that there was no urgent indication for a pacemaker.  While hospitalized on 08/19/23 telemetry showed second degree AV block type 1 with a rate of 53.   On 01/17/2024 was hospitalized with DKA, uncontrolled hyperglycemia, bacteremia, acute metabolic encephalopathy. No source of the bacteremia was identified. Due to the streptococcal bacteremia an echo was ordered to rule out vegetation. The TTE on 01/2024 had an LVEF was 65 to 70%, there was no regional wall motion abnormalities, mild LVH, moderate left atrial dilation, mild aortic valve  thickening, all other valves normal in structure with no vegetations. There was no evidence of stenosis or regurgitation  Mr. Chapdelaine went to the emergency department yesterday for an IR tube exchange. On 07/2022 he had a  percutaneous cholecystostomy done. The tube was most recently exchanged on 01/20 by IR.  His cholecystostomy tube accidentally fell out last week.  The tube remained out and IR planning to exchange on 3/4.  Requested surgical evaluation for potential cholecystectomy.  Denies any fevers, chills, or worsening abdominal pain.  On physical exam denied having any symptoms of complete heart block including syncope, lightheadedness, dizziness, palpitations, chest pain, or shortness of breath since being seen last on 10/2022.  Is able to go grocery shopping and walk to [place].  Is also able to carry groceries into the house.  Does moderate housework including vacuuming.    Past Medical History:  Diagnosis Date   Diabetes mellitus without complication (HCC)    Glaucoma    Heart block    complete heart block   Hypertension    Prostate cancer (HCC)    Been 3-4 years ago    Past Surgical History:  Procedure Laterality Date   CATARACT EXTRACTION Bilateral    INSERTION PROSTATE RADIATION SEED     IR EXCHANGE BILIARY DRAIN  08/19/2023   IR EXCHANGE BILIARY DRAIN  12/19/2023   IR EXCHANGE BILIARY DRAIN  01/20/2024   IR PERC CHOLECYSTOSTOMY  08/17/2022   IR RADIOLOGIST EVAL & MGMT  09/28/2022   IR RADIOLOGIST EVAL & MGMT  10/10/2022       Inpatient Medications:  Scheduled Meds:  aspirin  81 mg Oral Daily   atorvastatin  40 mg Oral Daily   clopidogrel  75 mg Oral Daily   enoxaparin (LOVENOX) injection  40 mg Subcutaneous Q24H   insulin aspart  0-15 Units Subcutaneous TID WC   insulin aspart  0-5 Units Subcutaneous QHS   insulin glargine  15 Units Subcutaneous BID   pantoprazole  40 mg Oral BID AC   Continuous Infusions:  PRN Meds: acetaminophen **OR** acetaminophen, albuterol,  ondansetron **OR** ondansetron (ZOFRAN) IV, senna-docusate  Allergies:   No Known Allergies  Social History:   Social History   Socioeconomic History   Marital status: Widowed    Spouse name: Not on file   Number of children: 1   Years of education: 16   Highest education level: Not on file  Occupational History   Occupation: Retired  Tobacco Use   Smoking status: Former    Current packs/day: 0.00    Average packs/day: 0.5 packs/day for 4.0 years (2.0 ttl pk-yrs)    Types: Cigarettes    Start date: 10/29/1970    Quit date: 10/29/1974    Years since quitting: 49.3   Smokeless tobacco: Never  Vaping Use   Vaping status: Never Used  Substance and Sexual Activity   Alcohol use: No   Drug use: No   Sexual activity: Not Currently  Other Topics Concern   Not on file  Social History Narrative   Born and raised in [place], [place]. Currently resides with his daughter (temporary), but he still has a residence.  No live. Fun: hunt and fish   Denies religious beliefs that would effect health care.    Social Drivers of Corporate investment banker Strain: Low Risk  (07/24/2023)   Overall Financial Resource Strain (CARDIA)    Difficulty of Paying Living Expenses: Not hard at all  Food Insecurity: Patient Unable To Answer (01/19/2024)   Hunger Vital Sign    Worried About Running Out of Food in the Last Year: Patient unable to answer    Ran Out of Food in the Last Year: Patient unable to answer  Transportation Needs: Patient Unable To Answer (01/19/2024)   PRAPARE - Transportation    Lack of Transportation (Medical): Patient unable to answer    Lack of Transportation (Non-Medical): Patient unable to answer  Physical Activity: Inactive (07/24/2023)   Exercise Vital Sign    Days of Exercise per Week: 0 days    Minutes of Exercise per Session: 0 min  Stress: No Stress Concern Present (07/24/2023)   [employer] of Occupational Health - Occupational Stress Questionnaire    Feeling of  Stress : Not at all  Social Connections: Patient Unable To Answer (01/19/2024)   Social Connection and Isolation Panel [NHANES]    Frequency of Communication with Friends and Family: Patient unable to answer    Frequency of Social Gatherings with Friends and Family: Patient unable to answer    Attends Religious Services: Patient unable to answer    Active Member of Clubs or Organizations: Patient unable to answer    Attends Banker Meetings: Patient unable to answer    Marital Status: Patient unable to answer  Intimate Partner Violence: Patient Unable To Answer (01/19/2024)   Humiliation, Afraid, Rape, and Kick questionnaire    Fear of Current or Ex-Partner: Patient unable to answer    Emotionally Abused: Patient unable to answer    Physically Abused: Patient unable to answer    Sexually Abused: Patient  unable to answer    Family History:    Family History  Problem Relation Age of Onset   Other Mother        unknown medical history   Diabetes Father    Diabetes Paternal Grandfather      ROS:  Please see the history of present illness.   All other ROS reviewed and negative.     Physical Exam/Data:   Vitals:   03/03/24 2250 03/03/24 2352 03/04/24 0236 03/04/24 0601  BP: (!) 119/58 115/63 (!) 118/58 119/62  Pulse: 68 67 66 62  Resp: 18 16 16 18   Temp: 97.8 F (36.6 C) (!) 97.4 F (36.3 C) 98.1 F (36.7 C) 97.6 F (36.4 C)  TempSrc: Oral     SpO2: 98% 96% 100% 99%  Weight: 77.1 kg     Height: 5' (1.524 m)       Intake/Output Summary (Last 24 hours) at 03/04/2024 0717 Last data filed at 03/04/2024 0600 Gross per 24 hour  Intake 0 ml  Output 100 ml  Net -100 ml      03/03/2024   10:50 PM 02/28/2024    9:00 PM 01/17/2024    1:16 PM  Last 3 Weights  Weight (lbs) 169 lb 15.6 oz 170 lb 165 lb  Weight (kg) 77.1 kg 77.111 kg 74.844 kg     Body mass index is 33.2 kg/m.  General:  Well nourished, well developed, in no acute distress HEENT: normal Neck: no  JVD Vascular: Distal pulses 2+ bilaterally Cardiac:  normal S1, S2; RRR; no murmur  Lungs:  clear to auscultation bilaterally, no wheezing, rhonchi or rales  Ext: 1+ edema Musculoskeletal:  No deformities Skin: warm and dry  Neuro:  CNs 2-12 intact, no focal abnormalities noted Psych:  Normal affect   EKG:   Had no recent EKG's Telemetry:  Telemetry was personally reviewed and demonstrates: Ordered to be placed on telemetry  Relevant CV Studies:  Previous TTE on 01/2024 IMPRESSIONS     1. Left ventricular ejection fraction, by estimation, is 65 to 70%. The  left ventricle has normal function. The left ventricle has no regional  wall motion abnormalities. There is mild left ventricular hypertrophy.  Indeterminate diastolic filling due to  E-A fusion.   2. Right ventricular systolic function is hyperdynamic. The right  ventricular size is normal.   3. Left atrial size was moderately dilated.   4. The mitral valve is normal in structure. Trivial mitral valve  regurgitation. No evidence of mitral stenosis.   5. The aortic valve is tricuspid. There is mild thickening of the aortic  valve. Aortic valve regurgitation is not visualized. Aortic valve  sclerosis is present, with no evidence of aortic valve stenosis.   6. The inferior vena cava is normal in size with greater than 50%  respiratory variability, suggesting right atrial pressure of 3 mmHg.   Comparison(s): No significant change from prior study. Prior images  reviewed side by side.   Conclusion(s)/Recommendation(s): No evidence of valvular vegetations on  this transthoracic echocardiogram. Consider a transesophageal  echocardiogram to exclude infective endocarditis if clinically indicated.   FINDINGS   Left Ventricle: Left ventricular ejection fraction, by estimation, is 65  to 70%. The left ventricle has normal function. The left ventricle has no  regional wall motion abnormalities. Definity contrast agent was given IV   to delineate the left ventricular   endocardial borders. The left ventricular internal cavity size was normal  in size. There is mild left  ventricular hypertrophy. Indeterminate  diastolic filling due to E-A fusion.   Right Ventricle: The right ventricular size is normal. No increase in  right ventricular wall thickness. Right ventricular systolic function is  hyperdynamic.   Left Atrium: Left atrial size was moderately dilated.   Right Atrium: Right atrial size was normal in size.   Pericardium: There is no evidence of pericardial effusion.   Mitral Valve: The mitral valve is normal in structure. Trivial mitral  valve regurgitation. No evidence of mitral valve stenosis.   Tricuspid Valve: The tricuspid valve is normal in structure. Tricuspid  valve regurgitation is not demonstrated. No evidence of tricuspid  stenosis.   Aortic Valve: The aortic valve is tricuspid. There is mild thickening of  the aortic valve. Aortic valve regurgitation is not visualized. Aortic  valve sclerosis is present, with no evidence of aortic valve stenosis.   Pulmonic Valve: The pulmonic valve was normal in structure. Pulmonic valve  regurgitation is not visualized. No evidence of pulmonic stenosis.   Aorta: The aortic root is normal in size and structure.   Venous: The inferior vena cava is normal in size with greater than 50%  respiratory variability, suggesting right atrial pressure of 3 mmHg.   IAS/Shunts: No atrial level shunt detected by color flow Doppler.   Laboratory Data:  High Sensitivity Troponin:  No results for input(s): "TROPONINIHS" in the last 720 hours.   Chemistry Recent Labs  Lab 02/28/24 2244 03/03/24 0935 03/03/24 2115  NA 132* 128* 133*  K 3.8 4.5 4.1  CL 95* 94* 101  CO2 25 25 21*  GLUCOSE 118* 315* 156*  BUN 48* 27* 24*  CREATININE 1.70* 1.38* 1.03  CALCIUM 10.5* 9.3 9.2  GFRNONAA 40* 51* >60  ANIONGAP 12 9 11     Recent Labs  Lab 02/28/24 2244  03/03/24 0935  PROT 8.6* 6.9  ALBUMIN 3.6 2.8*  AST 32 17  ALT 28 14  ALKPHOS 75 66  BILITOT 1.0 1.4*   Lipids No results for input(s): "CHOL", "TRIG", "HDL", "LABVLDL", "LDLCALC", "CHOLHDL" in the last 168 hours.  Hematology Recent Labs  Lab 02/28/24 2244 03/03/24 0935  WBC 13.4* 10.8*  RBC 5.26 4.53  HGB 14.0 12.2*  HCT 43.5 37.7*  MCV 82.7 83.2  MCH 26.6 26.9  MCHC 32.2 32.4  RDW 16.3* 16.0*  PLT 452* 325   Thyroid No results for input(s): "TSH", "FREET4" in the last 168 hours.  BNPNo results for input(s): "BNP", "PROBNP" in the last 168 hours.  DDimer No results for input(s): "DDIMER" in the last 168 hours.   Radiology/Studies:  CT ABDOMEN WO CONTRAST Result Date: 03/03/2024 CLINICAL DATA:  Acute calculus cholecystitis, post cholecystostomy catheter placement 08/17/2022, with inadvertent removal last month EXAM: CT ABDOMEN WITHOUT CONTRAST TECHNIQUE: Multidetector CT imaging of the abdomen was performed following the standard protocol without IV contrast. RADIATION DOSE REDUCTION: This exam was performed according to the departmental dose-optimization program which includes automated exposure control, adjustment of the mA and/or kV according to patient size and/or use of iterative reconstruction technique. COMPARISON:  02/28/2024 FINDINGS: Lower chest: No pleural or pericardial effusion. Minimal linear scarring or atelectasis posteriorly in the lung bases, with moderate degradation from breathing motion. Hepatobiliary: No liver lesion or biliary ductal dilatation. The gallbladder is incompletely distended, with mild wall thickening. Punctate calcific densities layer in its dependent portion. 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 Pancreas:  Mild atrophy with scattered coarse calcifications in the head and midbody suggesting chronic pancreatitis. No regional  inflammatory change. Spleen: Normal in size without focal abnormality. Adrenals/Urinary Tract: No adrenal mass. No urolithiasis or hydronephrosis. 9.9 cm cyst from the lower pole right kidney, with small peripheral calcifications as before. Stomach/Bowel: Stomach is incompletely distended. Visualized loops of small bowel decompressed. Normal appendix. Visualized colon is incompletely distended, without acute finding. Vascular/Lymphatic: Moderate calcified aortic plaque without aneurysm. No retroperitoneal or mesenteric adenopathy. Other: No abdominal ascites.  No free air. Musculoskeletal: No acute or significant osseous findings. IMPRESSION: 1. 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 2. Cholelithiasis with mild gallbladder wall thickening, gallbladder incompletely distended. 3. Chronic pancreatitis. Electronically Signed   By: Corlis Leak M.D.   On: 03/03/2024 15:24     Assessment and Plan:   Complete heart block Cardiac monitor on 11/2022 showed a baseline first-degree AV block, intermittent third degree heart block with lowest ventricular rates in 55-60 range, Second-degree AV block Mobitz 1. Lowest ventricular rates were in the 55-60 range. These heart block episodes happened at, 10/17/22 at 8:39 am and 7:29 pm, 10/22/22 at 3:35 am, 10/29/22 at 5:59 am. Most of these were during the night or early morning. The patient triggered the monitor during episodes of second degree heart block. The patient did not report any symptoms during office visit with Dr Ladona Ridgel on 11/16/22. At that time it was decided that the patient did not have any urgent indications for a pacemaker.  - Denies having any symptoms of complete heart block since 10/2022 including lightheadedness, syncope, dizziness, palpitation, chest pain, and shortness of breath. - Had a recent hospitalization on 12/2022 for  bacteremia. Repeat blood cultures may be necessary. - Order inpatient telemetry monitoring to help evaluate if a pacemaker is necessary. - Order an EKG - Continue to avoid AV nodal agents.   Cholecystitis S/P cholecystomy tube Previously had a cholecystectomy tube placed on 07/2022. Tube was exchanged on 01/2024.  On 02/28/24 this tube was accidentally removed.  Presented on 3/4 to get tube replaced.  IR decided against putting the drain in but wanted to proceed with surgery. Was previously cleared for surgery on 08/18/2022 by Dr. Ladona Ridgel.  Had a TTE on 01/2024 LVEF was 65 to 70%, there was no regional wall motion abnormalities, mild LVH, moderate left atrial dilation, mild aortic valve thickening, all other valves normal in structure with no evidence of stenosis or regurgitation.  -He is moderate risk with an RCRI score of 2 and a risk of 6.6% for getting a low risk surgery with risk factors include type 2 diabetes on insulin, and prior TIA.  -Is able to do more than 4 METS of activity on the [facility] activity status index including walking down the [place] at the grocery store, carrying in groceries from the car to the house, doing housework including vacuuming. -TEE in January 2025 was reassuring and found no significant abnormalities. -Was placed on cardiac monitoring and ordered an EKG to evaluate the risk from the heart block.  Getting a cholecystectomy appears to be necessary with the recent accidental tube removal. Will assess if cleared for surgery after placed on telemetry and EKG is done.  Hyponatremia - Sodium today was 133. - Management per primary  Type 2 diabetes requiring insulin -Currently managed with NovoLog. -A1c on November 2024 was 10.7. -Increases surgical risk. -Management per primary  Hyperlipidemia - Continue home atorvastatin  Hypertension Blood pressures are currently normotensive. Takes amlodipine 10 mg daily at home.  Currently it appears home amlodipine has been  held. -Continue to avoid AV nodal agents for blood pressure management.  Prior TIA - Had TIA on 09/2022. - Appears to be on aspirin and Plavix at home  GERD - management per primary  Risk Assessment/Risk Scores:         For questions or updates, please contact [facility] Please consult [email or web address] for contact info under    Signed, Arabella Merles, PA-C  03/04/2024 7:17 AM   I have seen and examined the patient along with Arabella Merles, PA-C .  I have reviewed the chart, notes and new data.  I agree with PA/NP's note.  Key new complaints: denies dizziness, syncope, unexplained falls, dyspnea or angina with daily activities Key examination changes: very hard of hearing, mildly irregular cardiac rhythm, otherwise normal CV exam. Key new findings / data: reviewed his ECGs, rhythm strips and previous monitor. He has not had bradycardia. He does have constant second degree AV block, Mobitz type 1, with long Wenckebach cycles (typically 5:4) and there are occasional consecutive P waves that fall on top of a rather fast junctional escape rhythm in the 50s. There is never high grade second degree AV block or true complete heart block and the QRS complexes are always narrow.  PLAN: His arrhythmia is mild and not symptomatic. Important to avoid meds with negative chronotropic effect, but he does not yet need a pacemaker and I do not think that he is at high risk of severe CV problems with planned cholecystectomy.  His risk is more related to age and poorly controlled DM, rather than the AV block.  Thurmon Fair, MD, [facility] [phone] 03/04/2024, 2:41 PM

## 2024-03-04 NOTE — TOC Initial Note (Signed)
 Transition of Care [facility]) - Initial/Assessment Note    Patient Details  Name: Andre Stout MRN: [id number] Date of Birth: 10/15/1940  Transition of [facility]) CM/SW Contact:    Howell Rucks, RN Phone Number: 03/04/2024, 1:22 PM  Clinical Narrative:     Met with pt at bedside to introduce role of TOC/NCM and review for dc planning,   reports no current home care services, DME: walker, shower chair, potty chair , reports good family support. MOON completed. TOC will continue to follow.             Expected Discharge Plan: Home/Self Care Barriers to Discharge: Continued Medical Work up   Patient Goals and CMS Choice Patient states their goals for this hospitalization and ongoing recovery are:: return home          Expected Discharge Plan and Services       Living arrangements for the past 2 months: Single Family Home                                      Prior Living Arrangements/Services Living arrangements for the past 2 months: Single Family Home Lives with:: Adult Children Patient language and need for interpreter reviewed:: Yes Do you feel safe going back to the place where you live?: Yes      Need for Family Participation in Patient Care: Yes (Comment) Care giver support system in place?: Yes (comment)   Criminal Activity/Legal Involvement Pertinent to Current Situation/Hospitalization: No - Comment as needed  Activities of Daily Living   ADL Screening (condition at time of admission) Independently performs ADLs?: Yes (appropriate for developmental age) Is the patient deaf or have difficulty hearing?: Yes Does the patient have difficulty seeing, even when wearing glasses/contacts?: No Does the patient have difficulty concentrating, remembering, or making decisions?: No  Permission Sought/Granted                  Emotional Assessment Appearance:: Appears stated age Attitude/Demeanor/Rapport: Gracious Affect (typically observed):  Accepting Orientation: : Oriented to Self, Oriented to Place, Oriented to  Time, Oriented to Situation Alcohol / Substance Use: Not Applicable Psych Involvement: No (comment)  Admission diagnosis:  Hyponatremia [E87.1] Patient Active Problem List   Diagnosis Date Noted   Malnutrition of moderate degree 01/20/2024   Hyperlipidemia 01/17/2024   Leukocytosis 01/17/2024   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 (HCC) 01/17/2024   Acute encephalopathy 01/17/2024   fracture of distal diaphyseal metaphyseal junction of the 5th left metacarpal with dorsal angulation 01/17/2024   Cholecystostomy care (HCC) 01/17/2024   Migration of percutaneous cholecystostomy tube 08/18/2023   Hypertension associated with diabetes (HCC) 08/18/2023   History of CVA (cerebrovascular accident) 08/18/2023   TIA (transient ischemic attack) 10/15/2022   Odynophagia 08/21/2022   Hyponatremia 08/20/2022   Obesity (BMI 30-39.9) 08/20/2022   Myocardial injury 08/20/2022   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   Seizure (HCC) 01/02/2022   Abnormal EKG 01/02/2022   Syncope and collapse 10/09/2020   Prostate cancer (HCC) 03/25/2019   Hypophosphatemia 03/06/2016   Benign essential HTN 03/05/2016   AKI (acute kidney injury) (HCC) 03/04/2016   PCP:  Olive Bass, FNP Pharmacy:   [id number] - [place], [place] - [address] [place] [address] Phone: [phone] Fax: [phone]  [facility] [address] [place] [address] Phone: [phone] Fax: [phone]  [facility] - [place], [place] - [address] [place] [address] Phone: [phone] Fax: [phone]  [id number] - [place], [place] - [address] AT [facility] [address] [facility] [address] [place] [address] Phone: [phone] Fax:  [phone]  [id number] - [place], [place] - [address] AT [facility] [address] [place] [address] Phone: [phone] Fax: [phone]     Social Drivers of Health (SDOH) Social History: SDOH Screenings   Food Insecurity: Patient Unable To Answer (01/19/2024)  Housing: Patient Unable To Answer (01/19/2024)  Transportation Needs: Patient Unable To Answer (01/19/2024)  Utilities: Patient Unable To Answer (01/19/2024)  Alcohol Screen: Low Risk  (07/24/2023)  Depression (PHQ2-9): Low Risk  (07/24/2023)  Financial Resource Strain: Low Risk  (07/24/2023)  Physical Activity: Inactive (07/24/2023)  Social Connections: Patient Unable To Answer (01/19/2024)  Stress: No Stress Concern Present (07/24/2023)  Tobacco Use: Medium Risk (03/03/2024)  Health Literacy: Adequate Health Literacy (07/24/2023)   SDOH Interventions:     Readmission Risk Interventions    08/15/2022    9:25 AM  Readmission Risk Prevention Plan  Transportation Screening Complete  PCP or Specialist Appt within 5-7 Days Complete  Home Care Screening Complete  Medication Review (RN CM) Complete

## 2024-03-04 NOTE — Progress Notes (Signed)
 PROGRESS NOTE   Andre Stout  AOZ:[id number] DOB: 11-01-1940 DOA: 03/03/2024 PCP: Olive Bass, FNP   Date of Service: the patient was seen and examined on 03/05/2024  Brief Narrative:  84 y.o. 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  Patient presented to [facility] 1 week after cholecystostomy tube fell out with progressively worsening weakness poor oral intake and nausea.    Of note, patient was recently hospitalized 01/17/2024 with DKA and coagulase negative as well as group B strep bacteremia.  Patient clinically improved and prior to discharge repeat blood cultures documented clearance of bacteremia before the patient was discharged home on oral amoxicillin.  Prescription was called to assess the patient for admission to the hospital for dehydration with hyponatremia.    Patient was hydrated gently with intravenous isotonic fluids with serial chemistries.  Hyponatremia is felt to be exacerbated by concurrent hyperglycemia due to poorly controlled diabetes mellitus.  Basal bolus insulin therapy was titrated throughout the hospitalization in addition to sliding scale insulin to achieve glycemic control.  Due to the patient's recently dislodged cholecystostomy tube, general surgery was consulted.  They initially recommended IR evaluation for possible tube replacement however upon further discussion they then recommended cardiology consultation for cardiac clearance for potential surgical intervention.    Assessment & Plan Colocutaneous fistula Patient unfortunately had his cholecystostomy tube dislodged 1 week ago  Since then the patient has been experiencing nausea, worsening malaise, extremely poor appetite  Upon discussions between general surgery and IR, options for  intervention are being considered General Surgery requested cardiology consultation for cardiac clearance, their assistance is appreciated  Hyponatremia Improving with gentle intravenous volume resuscitation Could gentle intravenous fluids Mobitz type 1 second degree atrioventricular block EKG that it is to be possible second-degree type I heart block although I do not see dropped beats No associated symptoms, hemodynamically stable Defer management to cardiology their input is appreciated History of complete heart block Patient underwent workup of intermittent complete heart block in the past and after a thorough workup no intervention or pacemaker was recommended EKG here reveals normal sinus rhythm Patient denies any symptoms consistent with complete heart block such as lightheadedness weakness or loss of consciousness  GERD without esophagitis Continue Protonix Uncontrolled type 2 diabetes mellitus with hyperglycemia, with long-term current use of insulin (HCC) Tenuous oral intake at this time For now, will increase Lantus to 18 units twice daily Adjusting sliding scale insulin to improve nightly coverage Repeating hemoglobin A1c Once patient is consistently tolerating oral intake will transition patient back to basal bolus insulin therapy. Mixed diabetic hyperlipidemia associated with type 2 diabetes mellitus (HCC) Continue home regimen of statin therapy History of stroke Diagnosed 09/2022  Continue Plavix, continue statin     Subjective:  Patient is strongly hard of hearing.  Patient is complaining of continued generalized weakness and nausea.  Patient actively denies abdominal pain.  Physical Exam:  Vitals:   03/04/24 0236 03/04/24 0601 03/04/24 1411 03/04/24 2130  BP: (!) 118/58 119/62 122/66 119/63  Pulse: 66 62 70 68  Resp: 16 18 18 16   Temp: 98.1 F (36.7 C) 97.6 F (36.4 C) 98.2 F (36.8 C) 98.1 F (36.7 C)  TempSrc:   Oral Oral  SpO2: 100% 99% 100% 98%   Weight:      Height:  Constitutional: Awake alert and oriented x3, no associated distress.  Severe presbycusis. Skin: no rashes, no lesions, poor skin turgor noted. Eyes: Pupils are equally reactive to light.  No evidence of scleral icterus or conjunctival pallor.  ENMT: Dry mucous membranes noted.  Posterior pharynx clear of any exudate or lesions.   Respiratory: clear to auscultation bilaterally, no wheezing, no crackles. Normal respiratory effort. No accessory muscle use.  Cardiovascular: Regular rate and rhythm, no murmurs / rubs / gallops. No extremity edema. 2+ pedal pulses. No carotid bruits.  Abdomen: Colocutaneous fistula site with ostomy bag placed over it with bilious drainage.  Otherwise, abdomen is soft and nontender.  No evidence of intra-abdominal masses.  Positive bowel sounds noted in all quadrants.   Musculoskeletal: No joint deformity upper and lower extremities. Good ROM, no contractures. Normal muscle tone.    Data Reviewed:  I have personally reviewed and interpreted labs, imaging.  Significant findings are   CBC: Recent Labs  Lab 02/28/24 2244 03/03/24 0935 03/04/24 1122  WBC 13.4* 10.8* 7.6  NEUTROABS 9.5*  --  5.0  HGB 14.0 12.2* 12.7*  HCT 43.5 37.7* 41.6  MCV 82.7 83.2 85.1  PLT 452* 325 359   Basic Metabolic Panel: Recent Labs  Lab 02/28/24 2244 03/03/24 0935 03/03/24 2115 03/04/24 1122  NA 132* 128* 133* 136  K 3.8 4.5 4.1 4.0  CL 95* 94* 101 100  CO2 25 25 21* 23  GLUCOSE 118* 315* 156* 247*  BUN 48* 27* 24* 21  CREATININE 1.70* 1.38* 1.03 1.03  CALCIUM 10.5* 9.3 9.2 9.4  MG  --   --   --  1.6*   GFR: Estimated Creatinine Clearance: 46.7 mL/min (by C-G formula based on SCr of 1.03 mg/dL). Liver Function Tests: Recent Labs  Lab 02/28/24 2244 03/03/24 0935 03/04/24 1122  AST 32 17 16  ALT 28 14 13   ALKPHOS 75 66 62  BILITOT 1.0 1.4* 1.7*  PROT 8.6* 6.9 7.0  ALBUMIN 3.6 2.8* 2.9*    Coagulation Profile: Recent Labs   Lab 02/28/24 2244  INR 1.0     EKG: Personally reviewed.  Rhythm is possible second-degree type I block, heart rate 70 bpm. No dynamic ST segment changes appreciated.   Code Status:  Full code.  Code status decision has been confirmed with: patient    Severity of Illness:  The appropriate patient status for this patient is OBSERVATION. Observation status is judged to be reasonable and necessary in order to provide the required intensity of service to ensure the patient's safety. The patient's presenting symptoms, physical exam findings, and initial radiographic and laboratory data in the context of their medical condition is felt to place them at decreased risk for further clinical deterioration. Furthermore, it is anticipated that the patient will be medically stable for discharge from the hospital within 2 midnights of admission.   Time spent:  54 minutes  Author:  Marinda Elk MD  03/05/2024 12:33 AM

## 2024-03-05 ENCOUNTER — Encounter (HOSPITAL_COMMUNITY): Payer: Self-pay | Admitting: Anesthesiology

## 2024-03-05 DIAGNOSIS — K632 Fistula of intestine: Secondary | ICD-10-CM

## 2024-03-05 DIAGNOSIS — Z8679 Personal history of other diseases of the circulatory system: Secondary | ICD-10-CM | POA: Diagnosis not present

## 2024-03-05 DIAGNOSIS — E871 Hypo-osmolality and hyponatremia: Secondary | ICD-10-CM | POA: Diagnosis not present

## 2024-03-05 DIAGNOSIS — K219 Gastro-esophageal reflux disease without esophagitis: Secondary | ICD-10-CM | POA: Insufficient documentation

## 2024-03-05 DIAGNOSIS — Z8673 Personal history of transient ischemic attack (TIA), and cerebral infarction without residual deficits: Secondary | ICD-10-CM | POA: Diagnosis not present

## 2024-03-05 DIAGNOSIS — E1165 Type 2 diabetes mellitus with hyperglycemia: Secondary | ICD-10-CM

## 2024-03-05 DIAGNOSIS — I441 Atrioventricular block, second degree: Secondary | ICD-10-CM | POA: Insufficient documentation

## 2024-03-05 LAB — COMPREHENSIVE METABOLIC PANEL
ALT: 12 U/L (ref 0–44)
AST: 17 U/L (ref 15–41)
Albumin: 2.7 g/dL — ABNORMAL LOW (ref 3.5–5.0)
Alkaline Phosphatase: 62 U/L (ref 38–126)
Anion gap: 11 (ref 5–15)
BUN: 22 mg/dL (ref 8–23)
CO2: 21 mmol/L — ABNORMAL LOW (ref 22–32)
Calcium: 9.3 mg/dL (ref 8.9–10.3)
Chloride: 101 mmol/L (ref 98–111)
Creatinine, Ser: 0.96 mg/dL (ref 0.61–1.24)
GFR, Estimated: >60 mL/min (ref >60)
Glucose, Bld: 201 mg/dL — ABNORMAL HIGH (ref 70–99)
Potassium: 4.1 mmol/L (ref 3.5–5.1)
Sodium: 133 mmol/L — ABNORMAL LOW (ref 135–145)
Total Bilirubin: 1.7 mg/dL — ABNORMAL HIGH (ref 0.0–1.2)
Total Protein: 7 g/dL (ref 6.5–8.1)

## 2024-03-05 LAB — HEMOGLOBIN A1C
Hgb A1c MFr Bld: 10.2 % — ABNORMAL HIGH (ref 4.8–5.6)
Mean Plasma Glucose: 246.04 mg/dL

## 2024-03-05 LAB — CBC WITH DIFFERENTIAL/PLATELET
Abs Immature Granulocytes: 0.02 10*3/uL (ref 0.00–0.07)
Basophils Absolute: 0.1 10*3/uL (ref 0.0–0.1)
Basophils Relative: 1 %
Eosinophils Absolute: 0.1 10*3/uL (ref 0.0–0.5)
Eosinophils Relative: 2 %
HCT: 43.6 % (ref 39.0–52.0)
Hemoglobin: 13.1 g/dL (ref 13.0–17.0)
Immature Granulocytes: 0 %
Lymphocytes Relative: 26 %
Lymphs Abs: 2 10*3/uL (ref 0.7–4.0)
MCH: 26.1 pg (ref 26.0–34.0)
MCHC: 30 g/dL (ref 30.0–36.0)
MCV: 86.9 fL (ref 80.0–100.0)
Monocytes Absolute: 0.9 10*3/uL (ref 0.1–1.0)
Monocytes Relative: 12 %
Neutro Abs: 4.5 10*3/uL (ref 1.7–7.7)
Neutrophils Relative %: 59 %
Platelets: 352 10*3/uL (ref 150–400)
RBC: 5.02 MIL/uL (ref 4.22–5.81)
RDW: 15.9 % — ABNORMAL HIGH (ref 11.5–15.5)
WBC: 7.6 10*3/uL (ref 4.0–10.5)
nRBC: 0 % (ref 0.0–0.2)

## 2024-03-05 LAB — MAGNESIUM: Magnesium: 1.7 mg/dL (ref 1.7–2.4)

## 2024-03-05 LAB — GLUCOSE, CAPILLARY
Glucose-Capillary: 189 mg/dL — ABNORMAL HIGH (ref 70–99)
Glucose-Capillary: 190 mg/dL — ABNORMAL HIGH (ref 70–99)
Glucose-Capillary: 135 mg/dL — ABNORMAL HIGH (ref 70–99)

## 2024-03-05 SURGERY — LAPAROSCOPIC CHOLECYSTECTOMY
Anesthesia: General

## 2024-03-05 MED ORDER — DEXAMETHASONE SODIUM PHOSPHATE 10 MG/ML IJ SOLN
INTRAMUSCULAR | Status: AC
  Filled 2024-03-05: qty 1

## 2024-03-05 MED ORDER — SODIUM CHLORIDE 0.9 % IV SOLN: INTRAVENOUS | Status: AC

## 2024-03-05 MED ORDER — INSULIN GLARGINE 100 UNIT/ML ~~LOC~~ SOLN: 20.0000 [IU] | Freq: Two times a day (BID) | SUBCUTANEOUS | Status: DC

## 2024-03-05 MED ORDER — LIDOCAINE HCL (PF) 2 % IJ SOLN
INTRAMUSCULAR | Status: AC
  Filled 2024-03-05: qty 5

## 2024-03-05 MED ORDER — ONDANSETRON HCL 4 MG/2ML IJ SOLN
INTRAMUSCULAR | Status: AC
  Filled 2024-03-05: qty 2

## 2024-03-05 MED ORDER — INSULIN GLARGINE 100 UNIT/ML ~~LOC~~ SOLN
18.0000 [IU] | Freq: Two times a day (BID) | SUBCUTANEOUS | Status: DC
  Administered 2024-03-05: 18 [IU] via SUBCUTANEOUS
  Filled 2024-03-05 (×2): qty 0.18

## 2024-03-05 MED ORDER — FENTANYL CITRATE (PF) 100 MCG/2ML IJ SOLN
INTRAMUSCULAR | Status: AC
  Filled 2024-03-05: qty 2

## 2024-03-05 MED ORDER — INSULIN ASPART 100 UNIT/ML IJ SOLN
0.0000 [IU] | Freq: Three times a day (TID) | INTRAMUSCULAR | Status: DC
  Administered 2024-03-05 (×2): 3 [IU] via SUBCUTANEOUS

## 2024-03-05 MED ORDER — PROPOFOL 10 MG/ML IV BOLUS
INTRAVENOUS | Status: AC
  Filled 2024-03-05: qty 20

## 2024-03-05 NOTE — Consult Note (Addendum)
 WOC Nurse Consult Note: Consult requested for fistula.  Refer to previous WOC consult note yesterday. Ostomy pouch was applied at that time to small hole in right abd and is intact with good seal, mod amt liquid yellow drainage.  Supplies left at the bedside and instructions have been provided for bedside nurses to change PRN if leaking.  Use supplies: barrier ring, Lawson # [phone], urostomy pouch  Lawson # [phone], connect to bedside drainage bag if desired.  Please re-consult if further assistance is needed.  Mardee Postin MSN, RN, CWOCN, [place], [place] [phone]  Reason for Consult:

## 2024-03-05 NOTE — Assessment & Plan Note (Signed)
   Continue home regimen of statin therapy 

## 2024-03-05 NOTE — Assessment & Plan Note (Signed)
 Tenuous oral intake at this time For now, will increase Lantus to 18 units twice daily Adjusting sliding scale insulin to improve nightly coverage Repeating hemoglobin A1c Once patient is consistently tolerating oral intake will transition patient back to basal bolus insulin therapy.

## 2024-03-05 NOTE — Assessment & Plan Note (Signed)
 Patient unfortunately had his cholecystostomy tube dislodged 1 week ago  Since then the patient has been experiencing nausea, worsening malaise, extremely poor appetite  Upon discussions between general surgery and IR, options for intervention are being considered General Surgery requested cardiology consultation for cardiac clearance, their assistance is appreciated

## 2024-03-05 NOTE — Assessment & Plan Note (Deleted)
 Patient unfortunately had his cholecystostomy tube dislodged 1 week ago  Since then the patient has been experiencing nausea, worsening malaise, extremely poor appetite  Upon discussions between general surgery and IR, options for intervention are being considered General Surgery requested cardiology consultation for cardiac clearance, their assistance is appreciated

## 2024-03-05 NOTE — Assessment & Plan Note (Deleted)
 Tenuous oral intake at this time For now, will increase Lantus to 18 units twice daily Adjusting sliding scale insulin to improve nightly coverage Repeating hemoglobin A1c Once patient is consistently tolerating oral intake will transition patient back to basal bolus insulin therapy.

## 2024-03-05 NOTE — Assessment & Plan Note (Signed)
 Improving with gentle intravenous volume resuscitation Could gentle intravenous fluids

## 2024-03-05 NOTE — Assessment & Plan Note (Signed)
-  Continue Protonix

## 2024-03-05 NOTE — Progress Notes (Signed)
 Patient ID: Andre Stout, male   DOB: [birth year], 84 y.o.   MRN: [id number] Request received for replacement of percutaneous cholecystostomy tube in pt. Case/imaging studies were reviewed by Drs. Suttle/Mugweru. Pt currently afebrile with normal WBC. Patent cystic duct noted on prior cholangiogram. Pt has fistula along tract of previous GB drain- recommend ostomy placement over site. Recommend holding on drain replacement as pt has no clinical signs of recurrent cholecystitis. Pt would also likely continue to leak from around drain if replaced at this stage. If pt develops signs of recurrent cholecystitis can reevaluate for tube replacement. In interim hopefully fistula will close.

## 2024-03-05 NOTE — Assessment & Plan Note (Signed)
 Diagnosed 09/2022  Continue Plavix, continue statin

## 2024-03-05 NOTE — Assessment & Plan Note (Deleted)
 EKG that it is to be possible second-degree type I heart block although I do not see dropped beats No associated symptoms, hemodynamically stable Defer management to cardiology their input is appreciated

## 2024-03-05 NOTE — Assessment & Plan Note (Deleted)
 Patient underwent workup of intermittent complete heart block in the past and after a thorough workup no intervention or pacemaker was recommended EKG here reveals normal sinus rhythm Patient denies any symptoms consistent with complete heart block such as lightheadedness weakness or loss of consciousness

## 2024-03-05 NOTE — Assessment & Plan Note (Deleted)
   Continue home regimen of statin therapy 

## 2024-03-05 NOTE — Progress Notes (Signed)
 Nurse reviewed discharge instructions with pt's grandson, Ines Bloomer who verbalized understanding of follow up appointments.  Nurse explained how to change ostomy pouch and instructions given to change once a week.  No concerns at time of discharge.

## 2024-03-05 NOTE — Discharge Instructions (Signed)
 Please take all prescribed medications exactly as instructed. Please consume a low carbohydrate diet Please increase your physical activity as tolerated using an assistive device such as a walker or ambulate with assistance. Please maintain all outpatient follow-up appointments including follow-up with your primary care provider and endocrinologist. Check your stoma area every day for signs of infection. Check for: More redness, swelling, or pain. More fluid or blood. Warmth. Pus or a bad smell. Follow your health care provider's instructions about changing and cleaning your ostomy pouch. Keep supplies with you at all times to care for your stoma and ostomy pouch. Also keep extra clothing with you. Please return to the emergency department if you develop worsening abdominal pain, fever or inability to tolerate oral intake.

## 2024-03-05 NOTE — Assessment & Plan Note (Deleted)
-  Continue Protonix

## 2024-03-05 NOTE — Assessment & Plan Note (Signed)
>>  ASSESSMENT AND PLAN FOR UNCONTROLLED TYPE 2 DIABETES MELLITUS WITH HYPERGLYCEMIA, WITH LONG-TERM CURRENT USE OF INSULIN  (HCC) WRITTEN ON 03/05/2024 12:39 AM BY SHALHOUB, ZACHARY PARAS, MD  Tenuous oral intake at this time For now, will increase Lantus  to 18 units twice daily Adjusting sliding scale insulin  to improve nightly coverage Repeating hemoglobin A1c Once patient is consistently tolerating oral intake will transition patient back to basal bolus insulin  therapy.

## 2024-03-05 NOTE — Assessment & Plan Note (Signed)
 Patient underwent workup of intermittent complete heart block in the past and after a thorough workup no intervention or pacemaker was recommended EKG here reveals normal sinus rhythm Patient denies any symptoms consistent with complete heart block such as lightheadedness weakness or loss of consciousness

## 2024-03-05 NOTE — Assessment & Plan Note (Deleted)
 Diagnosed 09/2022  Continue Plavix, continue statin

## 2024-03-05 NOTE — Assessment & Plan Note (Signed)
 EKG that it is to be possible second-degree type I heart block although I do not see dropped beats No associated symptoms, hemodynamically stable Defer management to cardiology their input is appreciated

## 2024-03-05 NOTE — Progress Notes (Signed)
 [facility] Progress Note     Subjective: CC:  NAEO. Denies abdominal pain. Oriented to self, [place], gallbladder problems, reports year at 34.  Objective: Vital signs in last 24 hours: Temp:  [97.9 F (36.6 C)-98.2 F (36.8 C)] 97.9 F (36.6 C) (03/06 0526) Pulse Rate:  [67-70] 67 (03/06 0526) Resp:  [16-18] 16 (03/06 0526) BP: (119-130)/(63-80) 130/80 (03/06 0526) SpO2:  [98 %-100 %] 98 % (03/06 0526) Last BM Date : 03/02/24  Intake/Output from previous day: 03/05 0701 - 03/06 0700 In: 120 [P.O.:120] Out: 650 [Urine:650] Intake/Output this shift: Total I/O In: -  Out: 50 [Urine:50]  PE: Gen:  Alert, NAD, chronically ill appearing Card:  Regular rate and rhythm.  Pulm:  Normal effort Abd: Soft, non-tender, non-distended, RUQ w/ ostomy appliance around fistula - consistent with known cholecystocutaneous fistula  Skin: warm and dry, no rashes   Lab Results:  Recent Labs    03/04/24 1122 03/05/24 0509  WBC 7.6 7.6  HGB 12.7* 13.1  HCT 41.6 43.6  PLT 359 352   BMET Recent Labs    03/04/24 1122 03/05/24 0509  NA 136 133*  K 4.0 4.1  CL 100 101  CO2 23 21*  GLUCOSE 247* 201*  BUN 21 22  CREATININE 1.03 0.96  CALCIUM 9.4 9.3   PT/INR No results for input(s): "LABPROT", "INR" in the last 72 hours. CMP     Component Value Date/Time   NA 133 (L) 03/05/2024 0509   K 4.1 03/05/2024 0509   CL 101 03/05/2024 0509   CO2 21 (L) 03/05/2024 0509   GLUCOSE 201 (H) 03/05/2024 0509   BUN 22 03/05/2024 0509   CREATININE 0.96 03/05/2024 0509   CREATININE 1.11 01/12/2022 1541   CALCIUM 9.3 03/05/2024 0509   PROT 7.0 03/05/2024 0509   ALBUMIN 2.7 (L) 03/05/2024 0509   AST 17 03/05/2024 0509   ALT 12 03/05/2024 0509   ALKPHOS 62 03/05/2024 0509   BILITOT 1.7 (H) 03/05/2024 0509   GFRNONAA >60 03/05/2024 0509   GFRAA >60 03/07/2016 0337   Lipase     Component Value Date/Time   LIPASE 20 02/28/2024 2244       Studies/Results: CT  ABDOMEN WO CONTRAST Result Date: 03/03/2024 CLINICAL DATA:  Acute calculus cholecystitis, post cholecystostomy catheter placement 08/17/2022, with inadvertent removal last month EXAM: CT ABDOMEN WITHOUT CONTRAST TECHNIQUE: Multidetector CT imaging of the abdomen was performed following the standard protocol without IV contrast. RADIATION DOSE REDUCTION: This exam was performed according to the departmental dose-optimization program which includes automated exposure control, adjustment of the mA and/or kV according to patient size and/or use of iterative reconstruction technique. COMPARISON:  02/28/2024 FINDINGS: Lower chest: No pleural or pericardial effusion. Minimal linear scarring or atelectasis posteriorly in the lung bases, with moderate degradation from breathing motion. Hepatobiliary: No liver lesion or biliary ductal dilatation. The gallbladder is incompletely distended, with mild wall thickening. Punctate calcific densities layer in its dependent portion. 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 Pancreas: Mild atrophy with scattered coarse calcifications in the head and midbody suggesting chronic pancreatitis. No regional inflammatory change. Spleen: Normal in size without focal abnormality. Adrenals/Urinary Tract: No adrenal mass. No urolithiasis or hydronephrosis. 9.9 cm cyst from the lower pole right kidney, with small peripheral calcifications as before. Stomach/Bowel: Stomach is incompletely distended. Visualized loops of small bowel decompressed. Normal appendix. Visualized colon is incompletely  distended, without acute finding. Vascular/Lymphatic: Moderate calcified aortic plaque without aneurysm. No retroperitoneal or mesenteric adenopathy. Other: No abdominal ascites.  No free air. Musculoskeletal: No acute or significant osseous findings. IMPRESSION: 1. Thick-walled fistula  along the tract of the previous cholecystostomy catheter from the fundus of the gallbladder into the right upper quadrant abdominal body wall. 2. Cholelithiasis with mild gallbladder wall thickening, gallbladder incompletely distended. 3. Chronic pancreatitis. Electronically Signed   By: Corlis Leak M.D.   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   Cardiology has been consulted for peri-operative risk evaluation.  Ultimately he would benefit from cholecystectomy. See cardiology note from 3/5 - patient is moderate risk for surgery. I discussed surgery with the patient and his daughter. Daughter is adamant that she does not want him to have surgery. I recommended continuation of ostomy pouch to control fistula and monitoring for signs of cholecystitis vs asking IR to consider tube replacement. She was hoping to having IR cholecystostomy tube replaced to prevent future infection. Agree with IR that, at present, there are no acute indications for perc chole tube, but he would be at risk for cholecystitis in the future if the fistula closes.   CCS will sign off. Patient can call our office and follow up with Dr. Dwain Sarna if he would like to consider cholecystectomy.     LOS: 0 days   I reviewed nursing notes, hospitalist notes, last 24 h vitals and pain scores, last 48 h intake and output, last 24 h labs and trends, and last 24 h imaging results.  This care required moderate level of medical decision making.   Hosie Spangle, PA-C [facility] Please see Amion for pager number during day hours 7:00am-4:30pm

## 2024-03-05 NOTE — Progress Notes (Signed)
   Patient was seen yesterday for pre-op evaluation and felt to be at acceptable risk for surgery. Please see consult note from 03/04/2024 for more information. No new cardiac recommendations. Cardiology will sign off. Please call us if needed.

## 2024-03-05 NOTE — Assessment & Plan Note (Deleted)
 Improving with gentle intravenous volume resuscitation Could gentle intravenous fluids

## 2024-03-09 ENCOUNTER — Ambulatory Visit (HOSPITAL_COMMUNITY): Admitting: Nurse Practitioner

## 2024-03-10 ENCOUNTER — Ambulatory Visit: Admitting: Family

## 2024-03-13 ENCOUNTER — Ambulatory Visit (HOSPITAL_COMMUNITY)
Admission: RE | Admit: 2024-03-13 | Discharge: 2024-03-13 | Disposition: A | Discharge status: Home / Self Care | Source: Ambulatory Visit | Attending: Nurse Practitioner | Admitting: Nurse Practitioner

## 2024-03-13 DIAGNOSIS — L24B0 Irritant contact dermatitis related to unspecified stoma or fistula: Secondary | ICD-10-CM | POA: Diagnosis not present

## 2024-03-13 DIAGNOSIS — T8183XA Persistent postprocedural fistula, initial encounter: Secondary | ICD-10-CM

## 2024-03-13 DIAGNOSIS — K823 Fistula of gallbladder: Secondary | ICD-10-CM | POA: Diagnosis present

## 2024-03-13 NOTE — Progress Notes (Signed)
 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  Not an ideal surgical candidate at this time.   HPI:  Cholecystitis with percutaneous tube in place since 2023.  Recently was dislodged and fistula was formed.   Past Medical History:  Diagnosis Date   Diabetes mellitus without complication (HCC)    Glaucoma    Heart block    complete heart block   Hypertension    Prostate cancer (HCC)    Been 3-4 years ago   Family History  Problem Relation Age of Onset   Other Mother        unknown medical history   Diabetes Father    Diabetes Paternal Grandfather    No Known Allergies Current Outpatient Medications  Medication Sig Dispense Refill Last Dose/Taking   acetaminophen (TYLENOL) 500 MG tablet Take 1,000 mg by mouth daily as needed (pain).      amLODipine (NORVASC) 10 MG tablet Take 1 tablet (10 mg total) by mouth daily. 90 tablet 3    aspirin 81 MG chewable tablet Chew 81 mg by mouth daily.      atorvastatin (LIPITOR) 40 MG tablet Take 1 tablet (40 mg total) by mouth daily. 90 tablet 3    Azelastine HCl 137 MCG/SPRAY SOLN PLACE 1 SPRAY INTO BOTH NOSTRILS 2 (TWO) TIMES DAILY. USE IN EACH NOSTRIL AS DIRECTED 30 mL 1    Blood Glucose Monitoring Suppl (ACCU-CHEK GUIDE) w/Device KIT Use As Directed 1 kit 0    clopidogrel (PLAVIX) 75 MG tablet Take 1 tablet (75 mg total) by mouth daily. 90 tablet 3    Continuous Blood Gluc Receiver (FREESTYLE LIBRE 3 READER) DEVI 1 each by Does not apply route daily. 1 each 0    Continuous Blood Gluc Sensor (FREESTYLE LIBRE 3 SENSOR) MISC 1 each by Does not apply route every 14 (fourteen) days. 6 each 3    insulin aspart protamine - aspart (NOVOLOG 70/30 FLEXPEN) (70-30) 100 UNIT/ML FlexPen 30 units before b'fast 20 units before lunch 15 units before dinner Take the insulin 10-15 min before meals. 75 mL 4    Insulin Pen Needle 32G X 4 MM MISC Use to inject insulin 3 times a day 300 each 3     Insulin Syringe-Needle U-100 (BD INSULIN SYRINGE U/F) 31G X 5/16" 1 ML MISC USE 2 DAILY 200 each 4    magnesium oxide (MAG-OX) 400 (240 Mg) MG tablet TAKE 1 TABLET BY MOUTH TWICE A DAY 60 tablet 11    Multiple Vitamins-Minerals (PRESERVISION AREDS 2) CAPS Take 1 capsule by mouth at bedtime.      ondansetron (ZOFRAN) 4 MG tablet Take 4 mg by mouth every 8 (eight) hours as needed.      ONETOUCH DELICA LANCETS 33G MISC Use to check blood sugar 4 times per day. Dx code: E11.9 200 each 2    ONETOUCH ULTRA test strip USE TO MONITOR GLUCOSE LEVELS 4 TIMES PER DAY E11.9 100 each 2    pantoprazole (PROTONIX) 40 MG tablet Take 1 tablet (40 mg total) by mouth 2 (two) times daily before a meal. 180 tablet 3    No current facility-administered medications for this encounter.   ROS  Review of Systems  Constitutional:  Positive for fatigue.  Gastrointestinal:        Cholecystitis  fistula  Musculoskeletal:  Positive for myalgias.  Skin:  Positive for color change and rash.       Cholecystocutaneous  fistula  Psychiatric/Behavioral:  Positive for confusion.    Vital signs:  BP 118/63   Pulse 71   Temp 98.2 F (36.8 C) (Oral)   Resp 18   SpO2 98%  Exam:  Physical Exam Vitals reviewed.  Constitutional:      Appearance: Normal appearance.  Cardiovascular:     Rate and Rhythm: Regular rhythm.     Heart sounds: Normal heart sounds.  Pulmonary:     Effort: Pulmonary effort is normal.  Abdominal:     Comments: RUQ fistula  Musculoskeletal:     Comments: weakness  Skin:    Findings: Erythema and rash present.  Psychiatric:     Comments: confusion     Stoma type/location:  RUQ fistula from gallbladder Stomal assessment/size:  0.3 cm stomatized fistula.  Draining yellow bilious effluent. Pouching with urostomy pouch and barrier ring Peristomal assessment:  denuded skin to pouching perimeter consistent with medical adhesive related skin injury.  Will keep this area out of the pouching area  today and protect skin with stoma powder and skin prep Treatment options for stomal/peristomal skin: 1/2 barrier ring around stoma and 1 piece convex pouch   stoma powder and skin prep to irritated skin Output: liquid bilious effluent Ostomy pouching: 1pc. Convex with 1/2 barrier ring  Education provided:  grandson/caregiver her with patient.  I demonstrate pouch change, protecting irritated skin with stoma powder and skin prep and application of 1/2 barrier ring and 1piece convex urostomy pouch.  I provide 5 pouch sets today. I will enroll with [facility] for supplies.  Family understands that pouches may not be covered for a fistula.       Impression/dx  Fistula Irritant contact dermatitis Discussion  See above   Fistula care Plan  Follow up appointment made.      Visit time: 55 minutes.   Mike Gip FNP-BC

## 2024-03-13 NOTE — Discharge Instructions (Signed)
 Enroll with [facility] 1 piece convex urostomy pouch Barrier ring Stoma powder  Skin prep

## 2024-03-20 ENCOUNTER — Other Ambulatory Visit (HOSPITAL_COMMUNITY): Payer: Self-pay | Admitting: Nurse Practitioner

## 2024-03-20 ENCOUNTER — Inpatient Hospital Stay: Admitting: Family

## 2024-03-20 DIAGNOSIS — N99528 Other complication of other external stoma of urinary tract: Secondary | ICD-10-CM

## 2024-03-20 DIAGNOSIS — L24B3 Irritant contact dermatitis related to fecal or urinary stoma or fistula: Secondary | ICD-10-CM

## 2024-03-20 DIAGNOSIS — Z436 Encounter for attention to other artificial openings of urinary tract: Secondary | ICD-10-CM

## 2024-03-23 ENCOUNTER — Ambulatory Visit (HOSPITAL_COMMUNITY): Admitting: Nurse Practitioner

## 2024-03-24 NOTE — Discharge Summary (Signed)
 Physician Discharge Summary   Patient: Andre Stout MRN: [id number] DOB: [birth year]  Admit date:     03/03/2024  Discharge date: 03/05/2024  Discharge Physician: Marinda Elk   PCP: Olive Bass, FNP   Recommendations at discharge:   Take all prescribed medications as instructed Follow up with your primary care provider and maintain all outpatient follow up appointments including Dr. Dwain Sarna with general surgery and the [facility] Return to the ED if you develop worsening pain, weakness or fevers in excess of 100.4 F.  Discharge Diagnoses: Principal Problem:   Colocutaneous fistula Active Problems:   Hyponatremia   History of stroke   Mixed diabetic hyperlipidemia associated with type 2 diabetes mellitus (HCC)   GERD without esophagitis   Uncontrolled type 2 diabetes mellitus with hyperglycemia, with long-term current use of insulin (HCC)   History of complete heart block   Mobitz type 1 second degree atrioventricular block  Resolved Problems:   * No resolved hospital problems. [facility] Course: 84 y.o. 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  Patient presented to [facility] 1 week after cholecystostomy tube fell out with progressively worsening weakness poor oral intake and nausea.    Of note, patient was recently hospitalized 01/17/2024 with DKA and coagulase negative as well as group B strep bacteremia.  Patient clinically improved and prior to discharge repeat blood cultures documented clearance of bacteremia before the patient was discharged home on oral amoxicillin.  Prescription was called to assess the patient for admission to the hospital for dehydration with hyponatremia.    Patient was hydrated gently with intravenous  isotonic fluids with serial chemistries.  Hyponatremia is felt to be exacerbated by concurrent hyperglycemia due to poorly controlled diabetes mellitus.  Basal bolus insulin therapy was titrated throughout the hospitalization in addition to sliding scale insulin to achieve glycemic control.  Due to the patient's recently dislodged cholecystostomy tube, general surgery was consulted.  They initially recommended IR evaluation for possible tube replacement however upon further discussion they then recommended cardiology consultation for cardiac clearance for potential surgical intervention.  All the patient did receive clearance for proceeding with surgery after a long discussion with the family about the risks and benefits of the surgery the patient/family opted not to proceed with cholecystectomy.  Case was then discussed again with interventional radiology at length.  Because of the colocutaneous fistula that has matured and the axonal dislodgment of the last drain they felt that inserting the another drain would not prove effective and likely leak.  They instead recommended treating the fistula as an ostomy and managing with an ostomy bag on a regular basis.  Arranges were made for the patient to receive ostomy education and ostomy supplies at time of discharge with close follow-up with ostomy clinic.  Per discussions with surgery, they recommend close monitoring of the ostomy output and if the ostomy ever does spontaneously close, which is unlikely, patient would be at risk of recurrent cholecystitis and may need to be reevaluated by surgery at that time.  Patient is being discharged home in improved and stable condition on 03/05/2024.      Pain control - [employer] Controlled Substance Reporting System database was reviewed. and patient was instructed, not to drive, operate heavy machinery, perform activities at heights, swimming or participation in water activities  or provide baby-sitting  services while on Pain, Sleep and Anxiety Medications; until their outpatient Physician has advised to do so again. Also recommended to not to take more than prescribed Pain, Sleep and Anxiety Medications.   Consultants: Dr. Dwain Sarna with General Surgery, Dr. Royann Shivers with Cardiology Procedures performed: None Disposition: Home Diet recommendation:  Discharge Diet Orders (From admission, onward)     Start     Ordered   03/05/24 0000  Diet Carb Modified        03/05/24 1433           Cardiac diet  DISCHARGE MEDICATION: Allergies as of 03/05/2024   No Known Allergies      Medication List     TAKE these medications    Accu-Chek Guide w/Device Kit Use As Directed   acetaminophen 500 MG tablet Commonly known as: TYLENOL Take 1,000 mg by mouth daily as needed (pain).   amLODipine 10 MG tablet Commonly known as: NORVASC Take 1 tablet (10 mg total) by mouth daily.   aspirin 81 MG chewable tablet Chew 81 mg by mouth daily.   atorvastatin 40 MG tablet Commonly known as: LIPITOR Take 1 tablet (40 mg total) by mouth daily.   Azelastine HCl 137 MCG/SPRAY Soln PLACE 1 SPRAY INTO BOTH NOSTRILS 2 (TWO) TIMES DAILY. USE IN EACH NOSTRIL AS DIRECTED   clopidogrel 75 MG tablet Commonly known as: PLAVIX Take 1 tablet (75 mg total) by mouth daily.   FreeStyle Libre 3 Reader [place] 1 each by Does not apply route daily.   FreeStyle Libre 3 Sensor Misc 1 each by Does not apply route every 14 (fourteen) days.   Insulin Pen Needle 32G X 4 MM Misc Use to inject insulin 3 times a day   Insulin Syringe-Needle U-100 31G X 5/16" 1 ML Misc Commonly known as: BD Insulin Syringe U/F USE 2 DAILY   magnesium oxide 400 (240 Mg) MG tablet Commonly known as: MAG-OX TAKE 1 TABLET BY MOUTH TWICE A DAY   NovoLOG 70/30 FlexPen (70-30) 100 UNIT/ML FlexPen Generic drug: insulin aspart protamine - aspart 30 units before b'fast 20 units before lunch 15 units before dinner Take the insulin  10-15 min before meals.   ondansetron 4 MG tablet Commonly known as: ZOFRAN Take 4 mg by mouth every 8 (eight) hours as needed.   OneTouch Delica Lancets 33G Misc Use to check blood sugar 4 times per day. Dx code: E11.9   OneTouch Ultra test strip Generic drug: glucose blood USE TO MONITOR GLUCOSE LEVELS 4 TIMES PER DAY E11.9   pantoprazole 40 MG tablet Commonly known as: PROTONIX Take 1 tablet (40 mg total) by mouth 2 (two) times daily before a meal.   PreserVision AREDS 2 Caps Take 1 capsule by mouth at bedtime.        Follow-up Information     Emelia Loron, MD. Call.   Specialty: General Surgery Why: As needed to discuss cholecystectomy Contact information: [address] [place] [address] [phone]         Olive Bass, FNP Follow up.   Specialty: Internal Medicine Contact information: [address] [place] [address] [phone]         [facility]. Schedule an appointment as soon as possible for a visit.   Specialty: General Surgery Contact information: [address] [place] [address] [phone]  Discharge Exam: Filed Weights   03/03/24 2250  Weight: 77.1 kg    Constitutional: Awake alert and oriented x3, no associated distress.  Hard of hearing. Respiratory: clear to auscultation bilaterally, no wheezing, no crackles. Normal respiratory effort. No accessory muscle use.  Cardiovascular: Regular rate and rhythm, no murmurs / rubs / gallops. No extremity edema. 2+ pedal pulses. No carotid bruits.  Abdomen: Abdomen is soft and nontender.  Ostomy collection bag in place over colocutaneous fistula.  Positive bowel sounds noted in all quadrants.   Musculoskeletal: No joint deformity upper and lower extremities. Good ROM, no contractures. Normal muscle tone.     Condition at discharge: fair  The results  of significant diagnostics from this hospitalization (including imaging, microbiology, ancillary and laboratory) are listed below for reference.   Imaging Studies: CT ABDOMEN WO CONTRAST Result Date: 03/03/2024 CLINICAL DATA:  Acute calculus cholecystitis, post cholecystostomy catheter placement 08/17/2022, with inadvertent removal last month EXAM: CT ABDOMEN WITHOUT CONTRAST TECHNIQUE: Multidetector CT imaging of the abdomen was performed following the standard protocol without IV contrast. RADIATION DOSE REDUCTION: This exam was performed according to the departmental dose-optimization program which includes automated exposure control, adjustment of the mA and/or kV according to patient size and/or use of iterative reconstruction technique. COMPARISON:  02/28/2024 FINDINGS: Lower chest: No pleural or pericardial effusion. Minimal linear scarring or atelectasis posteriorly in the lung bases, with moderate degradation from breathing motion. Hepatobiliary: No liver lesion or biliary ductal dilatation. The gallbladder is incompletely distended, with mild wall thickening. Punctate calcific densities layer in its dependent portion. 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 Pancreas: Mild atrophy with scattered coarse calcifications in the head and midbody suggesting chronic pancreatitis. No regional inflammatory change. Spleen: Normal in size without focal abnormality. Adrenals/Urinary Tract: No adrenal mass. No urolithiasis or hydronephrosis. 9.9 cm cyst from the lower pole right kidney, with small peripheral calcifications as before. Stomach/Bowel: Stomach is incompletely distended. Visualized loops of small bowel decompressed. Normal appendix. Visualized colon is incompletely distended, without acute finding. Vascular/Lymphatic: Moderate calcified aortic plaque without aneurysm. No  retroperitoneal or mesenteric adenopathy. Other: No abdominal ascites.  No free air. Musculoskeletal: No acute or significant osseous findings. IMPRESSION: 1. 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 2. Cholelithiasis with mild gallbladder wall thickening, gallbladder incompletely distended. 3. Chronic pancreatitis. Electronically Signed   By: Corlis Leak M.D.   On: 03/03/2024 15:24   CT ABDOMEN PELVIS W CONTRAST Result Date: 02/28/2024 CLINICAL DATA:  Abdominal pain, inverted removal of cholecystostomy tube. EXAM: CT ABDOMEN AND PELVIS WITH CONTRAST TECHNIQUE: Multidetector CT imaging of the abdomen and pelvis was performed using the standard protocol following bolus administration of intravenous contrast. RADIATION DOSE REDUCTION: This exam was performed according to the departmental dose-optimization program which includes automated exposure control, adjustment of the mA and/or kV according to patient size and/or use of iterative reconstruction technique. CONTRAST:  80mL OMNIPAQUE IOHEXOL 300 MG/ML  SOLN COMPARISON:  08/18/2023 FINDINGS: Lower chest: No acute abnormality. Hepatobiliary: Mild fatty infiltration of the liver is noted. The gallbladder is partially distended. Dependent gallstones are noted within. The tract for or the previous cholecystostomy tube is noted. No definitive leakage of bile adjacent to the liver is seen. No free fluid to suggest bile leak is noted. Pancreas: Unremarkable. No pancreatic ductal dilatation  or surrounding inflammatory changes. Spleen: Normal in size without focal abnormality. Adrenals/Urinary Tract: Adrenal glands are within normal limits. Kidneys demonstrate a normal enhancement pattern bilaterally. Large stable cyst is noted within the right kidney no follow-up is recommended. It measures 10.5 cm in greatest dimension. The bladder is partially  distended. Stomach/Bowel: No obstructive or inflammatory changes of the colon are noted. The appendix is within normal limits. Small bowel and stomach are unremarkable. Vascular/Lymphatic: Aortic atherosclerosis. No enlarged abdominal or pelvic lymph nodes. Reproductive: Prostate is unremarkable. Prostate brachytherapy seeds are noted. Other: No abdominal wall hernia or abnormality. No abdominopelvic ascites. Musculoskeletal: No acute or significant osseous findings. IMPRESSION: Cholecystostomy tube has been anteverted Karie Mainland removed. No definitive bile leak into the abdomen is noted. The tract is well visualized. Cholelithiasis. Electronically Signed   By: Alcide Clever M.D.   On: 02/28/2024 23:48    Microbiology: Results for orders placed or performed during the hospital encounter of 01/17/24  Culture, blood (Routine X 2) w Reflex to ID Panel     Status: Abnormal   Collection Time: 01/17/24  5:44 PM   Specimen: BLOOD RIGHT ARM  Result Value Ref Range Status   Specimen Description BLOOD RIGHT ARM  Final   Special Requests   Final    BOTTLES DRAWN AEROBIC AND ANAEROBIC Blood Culture results may not be optimal due to an inadequate volume of blood received in culture bottles   Culture  Setup Time   Final    GRAM POSITIVE COCCI IN CHAINS IN BOTH AEROBIC AND ANAEROBIC BOTTLES CRITICAL RESULT CALLED TO, READ BACK BY AND VERIFIED WITH: PHARMD J. MILLEN [id number] AT 1055, ADC    Culture (A)  Final    GROUP B STREP(S.AGALACTIAE)ISOLATED STAPHYLOCOCCUS EPIDERMIDIS THE SIGNIFICANCE OF ISOLATING THIS ORGANISM FROM A SINGLE SET OF BLOOD CULTURES WHEN MULTIPLE SETS ARE DRAWN IS UNCERTAIN. PLEASE NOTIFY THE MICROBIOLOGY DEPARTMENT WITHIN ONE WEEK IF SPECIATION AND SENSITIVITIES ARE REQUIRED. Performed at [facility], [address], [place], [place] [address]    Report Status 01/20/2024 FINAL  Final   Organism ID, Bacteria GROUP B STREP(S.AGALACTIAE)ISOLATED  Final      Susceptibility   Group b  strep(s.agalactiae)isolated - MIC*    CLINDAMYCIN >=1 RESISTANT Resistant     AMPICILLIN <=0.25 SENSITIVE Sensitive     ERYTHROMYCIN >=8 RESISTANT Resistant     VANCOMYCIN 0.5 SENSITIVE Sensitive     CEFTRIAXONE <=0.12 SENSITIVE Sensitive     LEVOFLOXACIN 0.5 SENSITIVE Sensitive     PENICILLIN <=0.06 SENSITIVE Sensitive     * GROUP B STREP(S.AGALACTIAE)ISOLATED  Blood Culture ID Panel (Reflexed)     Status: Abnormal   Collection Time: 01/17/24  5:44 PM  Result Value Ref Range Status   Enterococcus faecalis NOT DETECTED NOT DETECTED Final   Enterococcus Faecium NOT DETECTED NOT DETECTED Final   Listeria monocytogenes NOT DETECTED NOT DETECTED Final   Staphylococcus species NOT DETECTED NOT DETECTED Final   Staphylococcus aureus (BCID) NOT DETECTED NOT DETECTED Final   Staphylococcus epidermidis NOT DETECTED NOT DETECTED Final   Staphylococcus lugdunensis NOT DETECTED NOT DETECTED Final   Streptococcus species DETECTED (A) NOT DETECTED Final    Comment: CRITICAL RESULT CALLED TO, READ BACK BY AND VERIFIED WITH: PHARMD J. MILLEN [id number] AT 1055, ADC    Streptococcus agalactiae DETECTED (A) NOT DETECTED Final    Comment: CRITICAL RESULT CALLED TO, READ BACK BY AND VERIFIED WITH: PHARMD J. MILLEN [id number] AT 1055, ADC    Streptococcus pneumoniae NOT DETECTED NOT  DETECTED Final   Streptococcus pyogenes NOT DETECTED NOT DETECTED Final   A.calcoaceticus-baumannii NOT DETECTED NOT DETECTED Final   Bacteroides fragilis NOT DETECTED NOT DETECTED Final   Enterobacterales NOT DETECTED NOT DETECTED Final   Enterobacter cloacae complex NOT DETECTED NOT DETECTED Final   Escherichia coli NOT DETECTED NOT DETECTED Final   Klebsiella aerogenes NOT DETECTED NOT DETECTED Final   Klebsiella oxytoca NOT DETECTED NOT DETECTED Final   Klebsiella pneumoniae NOT DETECTED NOT DETECTED Final   Proteus species NOT DETECTED NOT DETECTED Final   Salmonella species NOT DETECTED NOT DETECTED Final   Serratia  marcescens NOT DETECTED NOT DETECTED Final   Haemophilus influenzae NOT DETECTED NOT DETECTED Final   Neisseria meningitidis NOT DETECTED NOT DETECTED Final   Pseudomonas aeruginosa NOT DETECTED NOT DETECTED Final   Stenotrophomonas maltophilia NOT DETECTED NOT DETECTED Final   Candida albicans NOT DETECTED NOT DETECTED Final   Candida auris NOT DETECTED NOT DETECTED Final   Candida glabrata NOT DETECTED NOT DETECTED Final   Candida krusei NOT DETECTED NOT DETECTED Final   Candida parapsilosis NOT DETECTED NOT DETECTED Final   Candida tropicalis NOT DETECTED NOT DETECTED Final   Cryptococcus neoformans/gattii NOT DETECTED NOT DETECTED Final    Comment: Performed at [facility], [address], [place], [place] [address]  Culture, blood (Routine X 2) w Reflex to ID Panel     Status: None   Collection Time: 01/17/24  6:22 PM   Specimen: BLOOD RIGHT HAND  Result Value Ref Range Status   Specimen Description BLOOD RIGHT HAND  Final   Special Requests   Final    BOTTLES DRAWN AEROBIC AND ANAEROBIC Blood Culture results may not be optimal due to an inadequate volume of blood received in culture bottles   Culture   Final    NO GROWTH 5 DAYS Performed at [facility], [address], [place], [place] [address]    Report Status 01/22/2024 FINAL  Final  Urine Culture (for pregnant, neutropenic or urologic patients or patients with an indwelling urinary catheter)     Status: Abnormal   Collection Time: 01/18/24  8:41 AM   Specimen: Urine, Clean Catch  Result Value Ref Range Status   Specimen Description URINE, CLEAN CATCH  Final   Special Requests   Final    NONE Performed at [facility], [address], [place], [place] [address]    Culture >=100,000 COLONIES/mL KLEBSIELLA PNEUMONIAE (A)  Final   Report Status 01/20/2024 FINAL  Final   Organism ID, Bacteria KLEBSIELLA PNEUMONIAE (A)  Final      Susceptibility   Klebsiella pneumoniae - MIC*    AMPICILLIN RESISTANT  Resistant     CEFAZOLIN <=4 SENSITIVE Sensitive     CEFEPIME <=0.12 SENSITIVE Sensitive     CEFTRIAXONE <=0.25 SENSITIVE Sensitive     CIPROFLOXACIN <=0.25 SENSITIVE Sensitive     GENTAMICIN <=1 SENSITIVE Sensitive     IMIPENEM <=0.25 SENSITIVE Sensitive     NITROFURANTOIN 64 INTERMEDIATE Intermediate     TRIMETH/SULFA <=20 SENSITIVE Sensitive     AMPICILLIN/SULBACTAM 4 SENSITIVE Sensitive     PIP/TAZO <=4 SENSITIVE Sensitive ug/mL    * >=100,000 COLONIES/mL KLEBSIELLA PNEUMONIAE  Culture, blood (Routine X 2) w Reflex to ID Panel     Status: None   Collection Time: 01/18/24  3:43 PM   Specimen: BLOOD LEFT ARM  Result Value Ref Range Status   Specimen Description BLOOD LEFT ARM  Final  Special Requests   Final    BOTTLES DRAWN AEROBIC AND ANAEROBIC Blood Culture results may not be optimal due to an inadequate volume of blood received in culture bottles   Culture   Final    NO GROWTH 5 DAYS Performed at [facility], [address], [place], [place] [address]    Report Status 01/23/2024 FINAL  Final  Culture, blood (Routine X 2) w Reflex to ID Panel     Status: None   Collection Time: 01/18/24  3:46 PM   Specimen: BLOOD LEFT HAND  Result Value Ref Range Status   Specimen Description BLOOD LEFT HAND  Final   Special Requests   Final    BOTTLES DRAWN AEROBIC AND ANAEROBIC Blood Culture results may not be optimal due to an inadequate volume of blood received in culture bottles   Culture   Final    NO GROWTH 5 DAYS Performed at [facility], [address], [place], [place] [address]    Report Status 01/23/2024 FINAL  Final    Discharge time spent: greater than 30 minutes.  Signed: Marinda Elk, MD [facility] 03/24/2024

## 2024-04-15 ENCOUNTER — Other Ambulatory Visit: Payer: Self-pay | Admitting: Physician Assistant

## 2024-04-15 DIAGNOSIS — H9202 Otalgia, left ear: Secondary | ICD-10-CM

## 2024-04-23 ENCOUNTER — Encounter: Payer: Self-pay | Admitting: Internal Medicine

## 2024-05-04 ENCOUNTER — Ambulatory Visit: Admitting: Internal Medicine

## 2024-05-04 NOTE — Progress Notes (Deleted)
 Patient ID: Andre Stout, male   DOB: 01-17-1940, 84 y.o.   MRN: [id number]  HPI: Andre Stout is a 84 y.o.-year-old male, returning for follow-up for DM2, dx in 1990, insulin -dependent since 2006, uncontrolled, with complications (CVA, CKD, DR, DKA, hypoglycemia). Pt. previously saw Dr. Washington Hacker, but last visit with me 6 months ago.   He is here with his son-in-law now.  However, we discussed on the phone with his daughter during the appointment.    Interim history: No increased urination, blurry vision, nausea, chest pain. Since last visit, he had COVID-19 12/2023.  He was also admitted with acute cholecystitis and AKI 01/17/2024, and then with hyponatremia post cholecystectomy tube dysfunction 03/03/2024.  He is not feeling better.  Reviewed HbA1c: Lab Results  Component Value Date   HGBA1C 10.2 (H) 03/05/2024   HGBA1C 10.7 (A) 11/14/2023   HGBA1C 10.5 07/11/2023   HGBA1C 9.0 (A) 01/22/2023   HGBA1C 8.4 (H) 10/15/2022   HGBA1C 8.3 (H) 08/02/2022   HGBA1C 8.9 (A) 02/07/2022   HGBA1C 8.2 (H) 01/03/2022   HGBA1C 8.8 (A) 11/17/2021   HGBA1C 10.5 (A) 09/15/2021   He is on: - Novolog  Insulin  70/30 (vials) >> Humalog  75/25 - 10-15 min before meals: Now on: - 30 units before b'fast >> 20 >> 30 units - 20 units before lunch >> - >> 20 units - 15 units before dinner >> 20 >> 15 units  Pt checks his sugars >4x a day and they are:  Previously:  Previously:   Lowest sugar was 75 >> 90s; he has hypoglycemia awareness at 70.  Highest sugar was 300s >> 300s.  Glucometer: One Touch  - + CKD, last BUN/creatinine:  Lab Results  Component Value Date   BUN 22 03/05/2024   BUN 21 03/04/2024   CREATININE 0.96 03/05/2024   CREATININE 1.03 03/04/2024   Lab Results  Component Value Date   MICRALBCREAT 17 11/15/2023   MICRALBCREAT 1.2 05/03/2015  On lisinopril  20 mg daily.  -+ HL; last set of lipids: Lab Results  Component Value Date   CHOL 115 11/14/2023   HDL 48.10 11/14/2023    LDLCALC 45 11/14/2023   LDLDIRECT 121.0 03/25/2019   TRIG 110.0 11/14/2023   CHOLHDL 2 11/14/2023  On Lipitor 40 mg daily.  - last eye exam was in 2023. + DR. + glaucoma. She has a retina specialist also (Dr. Augustus Ledger).  - no numbness and tingling in his feet.  Last foot exam 03/25/2023 by Dr. Maybelle Spatz.  He also has a history of complete heart block, prostate cancer, acute cholecystitis 08/2022.  ROS: + see HPI  Past Medical History:  Diagnosis Date   Diabetes mellitus without complication (HCC)    Glaucoma    Heart block    complete heart block   Hypertension    Prostate cancer (HCC)    Been 3-4 years ago   Past Surgical History:  Procedure Laterality Date   CATARACT EXTRACTION Bilateral    INSERTION PROSTATE RADIATION SEED     IR EXCHANGE BILIARY DRAIN  08/19/2023   IR EXCHANGE BILIARY DRAIN  12/19/2023   IR EXCHANGE BILIARY DRAIN  01/20/2024   IR PERC CHOLECYSTOSTOMY  08/17/2022   IR RADIOLOGIST EVAL & MGMT  09/28/2022   IR RADIOLOGIST EVAL & MGMT  10/10/2022   Social History   Socioeconomic History   Marital status: Widowed    Spouse name: Not on file   Number of children: 1   Years of education: 36  Highest education level: Not on file  Occupational History   Occupation: Retired  Tobacco Use   Smoking status: Former    Current packs/day: 0.00    Average packs/day: 0.5 packs/day for 4.0 years (2.0 ttl pk-yrs)    Types: Cigarettes    Start date: 10/29/1970    Quit date: 10/29/1974    Years since quitting: 49.5   Smokeless tobacco: Never  Vaping Use   Vaping status: Never Used  Substance and Sexual Activity   Alcohol use: No   Drug use: No   Sexual activity: Not Currently  Other Topics Concern   Not on file  Social History Narrative   Born and raised in [place], [place]. Currently resides with his daughter (temporary), but he still has a residence.  No live. Fun: hunt and fish   Denies religious beliefs that would effect health care.    Social Drivers of  Corporate investment banker Strain: Low Risk  (07/24/2023)   Overall Financial Resource Strain (CARDIA)    Difficulty of Paying Living Expenses: Not hard at all  Food Insecurity: Patient Unable To Answer (01/19/2024)   Hunger Vital Sign    Worried About Running Out of Food in the Last Year: Patient unable to answer    Ran Out of Food in the Last Year: Patient unable to answer  Transportation Needs: Patient Unable To Answer (01/19/2024)   PRAPARE - Transportation    Lack of Transportation (Medical): Patient unable to answer    Lack of Transportation (Non-Medical): Patient unable to answer  Physical Activity: Inactive (07/24/2023)   Exercise Vital Sign    Days of Exercise per Week: 0 days    Minutes of Exercise per Session: 0 min  Stress: No Stress Concern Present (07/24/2023)   [employer] of Occupational Health - Occupational Stress Questionnaire    Feeling of Stress : Not at all  Social Connections: Patient Unable To Answer (01/19/2024)   Social Connection and Isolation Panel [NHANES]    Frequency of Communication with Friends and Family: Patient unable to answer    Frequency of Social Gatherings with Friends and Family: Patient unable to answer    Attends Religious Services: Patient unable to answer    Active Member of Clubs or Organizations: Patient unable to answer    Attends Banker Meetings: Patient unable to answer    Marital Status: Patient unable to answer  Intimate Partner Violence: Patient Unable To Answer (01/19/2024)   Humiliation, Afraid, Rape, and Kick questionnaire    Fear of Current or Ex-Partner: Patient unable to answer    Emotionally Abused: Patient unable to answer    Physically Abused: Patient unable to answer    Sexually Abused: Patient unable to answer   Current Outpatient Medications on File Prior to Visit  Medication Sig Dispense Refill   acetaminophen  (TYLENOL ) 500 MG tablet Take 1,000 mg by mouth daily as needed (pain).     amLODipine   (NORVASC ) 10 MG tablet Take 1 tablet (10 mg total) by mouth daily. 90 tablet 3   aspirin  81 MG chewable tablet Chew 81 mg by mouth daily.     atorvastatin  (LIPITOR) 40 MG tablet Take 1 tablet (40 mg total) by mouth daily. 90 tablet 3   Azelastine  HCl 137 MCG/SPRAY SOLN PLACE 1 SPRAY INTO BOTH NOSTRILS 2 (TWO) TIMES DAILY. USE IN EACH NOSTRIL AS DIRECTED 30 mL 0   Blood Glucose Monitoring Suppl (ACCU-CHEK GUIDE) w/Device KIT Use As Directed 1 kit 0   clopidogrel  (  PLAVIX ) 75 MG tablet Take 1 tablet (75 mg total) by mouth daily. 90 tablet 3   Continuous Blood Gluc Receiver (FREESTYLE LIBRE 3 READER) DEVI 1 each by Does not apply route daily. 1 each 0   Continuous Blood Gluc Sensor (FREESTYLE LIBRE 3 SENSOR) MISC 1 each by Does not apply route every 14 (fourteen) days. 6 each 3   insulin  aspart protamine - aspart (NOVOLOG  70/30 FLEXPEN) (70-30) 100 UNIT/ML FlexPen 30 units before b'fast 20 units before lunch 15 units before dinner Take the insulin  10-15 min before meals. 75 mL 4   Insulin  Pen Needle 32G X 4 MM MISC Use to inject insulin  3 times a day 300 each 3   Insulin  Syringe-Needle U-100 (BD INSULIN  SYRINGE U/F) 31G X 5/16" 1 ML MISC USE 2 DAILY 200 each 4   magnesium  oxide (MAG-OX) 400 (240 Mg) MG tablet TAKE 1 TABLET BY MOUTH TWICE A DAY 60 tablet 11   Multiple Vitamins-Minerals (PRESERVISION AREDS 2) CAPS Take 1 capsule by mouth at bedtime.     ondansetron  (ZOFRAN ) 4 MG tablet Take 4 mg by mouth every 8 (eight) hours as needed.     ONETOUCH DELICA LANCETS 33G MISC Use to check blood sugar 4 times per day. Dx code: E11.9 200 each 2   ONETOUCH ULTRA test strip USE TO MONITOR GLUCOSE LEVELS 4 TIMES PER DAY E11.9 100 each 2   pantoprazole  (PROTONIX ) 40 MG tablet Take 1 tablet (40 mg total) by mouth 2 (two) times daily before a meal. 180 tablet 3   No current facility-administered medications on file prior to visit.   No Known Allergies Family History  Problem Relation Age of Onset   Other  Mother        unknown medical history   Diabetes Father    Diabetes Paternal Grandfather    PE: There were no vitals taken for this visit. Wt Readings from Last 3 Encounters:  03/03/24 169 lb 15.6 oz (77.1 kg)  02/28/24 170 lb (77.1 kg)  01/17/24 165 lb (74.8 kg)   Constitutional: normal weight, in NAD, walks with a walker Eyes:  EOMI, no exophthalmos ENT: no neck masses, no cervical lymphadenopathy Cardiovascular: RRR, No MRG, + B LE swelling Respiratory: CTA B Musculoskeletal: no deformities Skin:no rashes Neurological: no tremor with outstretched hands  ASSESSMENT: 1. 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 HL  PLAN:  1. Patient with longstanding, uncontrolled, type 2 diabetes, on a premixed insulin  regimen, with poor control.  At last visit, HbA1c was still elevated, at 10.7%, and he had another HbA1c obtained 2 months ago and this was only slightly lower, at 10.2%.  At last visit, sugars were still very high.  Reviewing CGM trends, increasing abruptly after lunch and remaining elevated throughout the night, slightly better after 3 AM, with another increase after breakfast.  Patient had problems obtaining the premixed insulin  from the pharmacy, regardless of which formulation we sent (NovoLog  70/30, Novolin  70/30, Humalog  75/25).  He was getting approximately 50% of the recommended doses so that he could ration his remaining insulin .  Unfortunately, we cannot change from the premixed insulin  since his daughter is giving him the injections and it would not be feasible for him to switch to a basal-bolus regimen. -At last visits, we discussed about healthier versions of snacks including unsalted nuts, veggies, or certain fruit with lower  glycemic index like berries, apples, pears, etc. CGM interpretation: -At today's visit, we reviewed his CGM  downloads: It appears that *** of values are in target range (goal >70%), while *** are higher than 180 (goal <25%), and *** are lower than 70 (goal <4%).  The calculated average blood sugar is ***.  The projected HbA1c for the next 3 months (GMI) is ***. -Reviewing the CGM trends, ***  - I suggested to:  Patient Instructions  Please continue Humalog  75/25: - 30 units before b'fast - 20 units before lunch - 15 units before dinner Take the insulin  10-15 min before meals   Please return in 2 months.  - we checked his HbA1c: 7%  - advised to check sugars at different times of the day - 4x a day, rotating check times - advised for yearly eye exams >> he is UTD - return to clinic in 2 months  2. HL - Latest lipid panel showed fractions at goal: Lab Results  Component Value Date   CHOL 115 11/14/2023   HDL 48.10 11/14/2023   LDLCALC 45 11/14/2023   LDLDIRECT 121.0 03/25/2019   TRIG 110.0 11/14/2023   CHOLHDL 2 11/14/2023  - He continues on Lipitor 40 mg daily without side effects  Emilie Harden, MD PhD [facility] Endocrin[facility]

## 2024-05-07 ENCOUNTER — Encounter: Payer: Self-pay | Admitting: Internal Medicine

## 2024-05-07 ENCOUNTER — Ambulatory Visit: Admitting: Internal Medicine

## 2024-05-07 VITALS — BP 120/70 | HR 78 | Ht 60.0 in | Wt 160.0 lb

## 2024-05-07 DIAGNOSIS — E1165 Type 2 diabetes mellitus with hyperglycemia: Secondary | ICD-10-CM

## 2024-05-07 DIAGNOSIS — E1159 Type 2 diabetes mellitus with other circulatory complications: Secondary | ICD-10-CM | POA: Diagnosis not present

## 2024-05-07 DIAGNOSIS — Z794 Long term (current) use of insulin: Secondary | ICD-10-CM | POA: Diagnosis not present

## 2024-05-07 DIAGNOSIS — E7849 Other hyperlipidemia: Secondary | ICD-10-CM | POA: Diagnosis not present

## 2024-05-07 LAB — POCT GLYCOSYLATED HEMOGLOBIN (HGB A1C): Hemoglobin A1C: 10.9 % — AB (ref 4.0–5.6)

## 2024-05-07 MED ORDER — NOVOLOG 70/30 FLEXPEN RELION (70-30) 100 UNIT/ML ~~LOC~~ SUPN: PEN_INJECTOR | SUBCUTANEOUS | Status: DC

## 2024-05-07 NOTE — Patient Instructions (Addendum)
 Please change NovoLog  70/30: - 30 units before b'fast - 15-20 units before dinner Take the insulin  10-15 min before meals  Try to stop eating at night.   Please return in 2-3 months.

## 2024-05-07 NOTE — Progress Notes (Signed)
 Patient ID: Andre Stout, male   DOB: 1940-01-28, 84 y.o.   MRN: [id number] This note was precharted 05/04/2024.  HPI: Andre Stout is a 84 y.o.-year-old male, returning for follow-up for DM2, dx in 1990, insulin -dependent since 2006, uncontrolled, with complications (CVA, CKD, DR, DKA, hypoglycemia). Pt. previously saw Dr. Washington Stout, but last visit with me 6 months ago.   He is here with his son-in-law now.  He is all of the history as patient is mostly nonverbal.  Interim history: Since last visit, he had COVID-19 12/2023.   He was also admitted with acute cholecystitis and AKI 01/17/2024, and then with hyponatremia post cholecystectomy tube dysfunction 03/03/2024.   He had some falls recently. Son-in-law is not aware of any complaints recently, but he does mention that they caught the patient on camera during the night eating snacks (sweets).  Reviewed HbA1c: Lab Results  Component Value Date   HGBA1C 10.2 (H) 03/05/2024   HGBA1C 10.7 (A) 11/14/2023   HGBA1C 10.5 07/11/2023   HGBA1C 9.0 (A) 01/22/2023   HGBA1C 8.4 (H) 10/15/2022   HGBA1C 8.3 (H) 08/02/2022   HGBA1C 8.9 (A) 02/07/2022   HGBA1C 8.2 (H) 01/03/2022   HGBA1C 8.8 (A) 11/17/2021   HGBA1C 10.5 (A) 09/15/2021   He is on: - Novolog  Insulin  70/30 (vials) >> Humalog  75/25 - 10-15 min before meals: Now on: - 30 units before b'fast >> 20 >> 30 units - 20 units before lunch >> - >> 20 units - 15 units before dinner >> 20 >> 15 >> 10-15 units  Pt checks his sugars >4x a day with a CGM but he did not have this for the last 3 weeks.  Currently checking manually: - am: 140-275 - 2h after b'fast: n/c - lunch: 100 - 2h after lunch: n/c - dinner: 40s-150 - 2h after dinner: see below - bedtime:200s   Previously:  Previously:   Lowest sugar was 75 >> 90s >> 40s; he has hypoglycemia awareness at 70. Highest sugar was 300s >> 300s >> HI (last weeK 250s)  Glucometer: One Touch  - + CKD, last BUN/creatinine:  Lab Results   Component Value Date   BUN 22 03/05/2024   BUN 21 03/04/2024   CREATININE 0.96 03/05/2024   CREATININE 1.03 03/04/2024   11/15/2023 ([facility]): Lab Results  Component Value Date   MICRALBCREAT 17 11/15/2023   MICRALBCREAT 1.2 05/03/2015  On lisinopril  20 mg daily.  -+ HL; last set of lipids: Lab Results  Component Value Date   CHOL 115 11/14/2023   HDL 48.10 11/14/2023   LDLCALC 45 11/14/2023   LDLDIRECT 121.0 03/25/2019   TRIG 110.0 11/14/2023   CHOLHDL 2 11/14/2023  On Lipitor 40 mg daily.  - last eye exam was in 2023. + DR. + glaucoma. She has a retina specialist also (Dr. Augustus Stout).  - no numbness and tingling in his feet.  Last foot exam 03/25/2023 by Andre Stout.  He also has a history of complete heart block, prostate cancer, acute cholecystitis 08/2022.  ROS: + see HPI  Past Medical History:  Diagnosis Date   Diabetes mellitus without complication (HCC)    Glaucoma    Heart block    complete heart block   Hypertension    Prostate cancer (HCC)    Been 3-4 years ago   Past Surgical History:  Procedure Laterality Date   CATARACT EXTRACTION Bilateral    INSERTION PROSTATE RADIATION SEED     IR EXCHANGE BILIARY DRAIN  08/19/2023  IR EXCHANGE BILIARY DRAIN  12/19/2023   IR EXCHANGE BILIARY DRAIN  01/20/2024   IR PERC CHOLECYSTOSTOMY  08/17/2022   IR RADIOLOGIST EVAL & MGMT  09/28/2022   IR RADIOLOGIST EVAL & MGMT  10/10/2022   Social History   Socioeconomic History   Marital status: Widowed    Spouse name: Not on file   Number of children: 1   Years of education: 68   Highest education level: Not on file  Occupational History   Occupation: Retired  Tobacco Use   Smoking status: Former    Current packs/day: 0.00    Average packs/day: 0.5 packs/day for 4.0 years (2.0 ttl pk-yrs)    Types: Cigarettes    Start date: 10/29/1970    Quit date: 10/29/1974    Years since quitting: 49.5   Smokeless tobacco: Never  Vaping Use   Vaping status: Never Used   Substance and Sexual Activity   Alcohol use: No   Drug use: No   Sexual activity: Not Currently  Other Topics Concern   Not on file  Social History Narrative   Born and raised in [place], [place]. Currently resides with his daughter (temporary), but he still has a residence.  No live. Fun: hunt and fish   Denies religious beliefs that would effect health care.    Social Drivers of Corporate investment banker Strain: Low Risk  (07/24/2023)   Overall Financial Resource Strain (CARDIA)    Difficulty of Paying Living Expenses: Not hard at all  Food Insecurity: Patient Unable To Answer (01/19/2024)   Hunger Vital Sign    Worried About Running Out of Food in the Last Year: Patient unable to answer    Ran Out of Food in the Last Year: Patient unable to answer  Transportation Needs: Patient Unable To Answer (01/19/2024)   PRAPARE - Transportation    Lack of Transportation (Medical): Patient unable to answer    Lack of Transportation (Non-Medical): Patient unable to answer  Physical Activity: Inactive (07/24/2023)   Exercise Vital Sign    Days of Exercise per Week: 0 days    Minutes of Exercise per Session: 0 min  Stress: No Stress Concern Present (07/24/2023)   [employer] of Occupational Health - Occupational Stress Questionnaire    Feeling of Stress : Not at all  Social Connections: Patient Unable To Answer (01/19/2024)   Social Connection and Isolation Panel [NHANES]    Frequency of Communication with Friends and Family: Patient unable to answer    Frequency of Social Gatherings with Friends and Family: Patient unable to answer    Attends Religious Services: Patient unable to answer    Active Member of Clubs or Organizations: Patient unable to answer    Attends Banker Meetings: Patient unable to answer    Marital Status: Patient unable to answer  Intimate Partner Violence: Patient Unable To Answer (01/19/2024)   Humiliation, Afraid, Rape, and Kick questionnaire    Fear  of Current or Ex-Partner: Patient unable to answer    Emotionally Abused: Patient unable to answer    Physically Abused: Patient unable to answer    Sexually Abused: Patient unable to answer   Current Outpatient Medications on File Prior to Visit  Medication Sig Dispense Refill   acetaminophen  (TYLENOL ) 500 MG tablet Take 1,000 mg by mouth daily as needed (pain).     amLODipine  (NORVASC ) 10 MG tablet Take 1 tablet (10 mg total) by mouth daily. 90 tablet 3   aspirin  81 MG  chewable tablet Chew 81 mg by mouth daily.     atorvastatin  (LIPITOR) 40 MG tablet Take 1 tablet (40 mg total) by mouth daily. 90 tablet 3   Azelastine  HCl 137 MCG/SPRAY SOLN PLACE 1 SPRAY INTO BOTH NOSTRILS 2 (TWO) TIMES DAILY. USE IN EACH NOSTRIL AS DIRECTED 30 mL 0   Blood Glucose Monitoring Suppl (ACCU-CHEK GUIDE) w/Device KIT Use As Directed 1 kit 0   clopidogrel  (PLAVIX ) 75 MG tablet Take 1 tablet (75 mg total) by mouth daily. 90 tablet 3   Continuous Blood Gluc Receiver (FREESTYLE LIBRE 3 READER) DEVI 1 each by Does not apply route daily. 1 each 0   Continuous Blood Gluc Sensor (FREESTYLE LIBRE 3 SENSOR) MISC 1 each by Does not apply route every 14 (fourteen) days. 6 each 3   insulin  aspart protamine - aspart (NOVOLOG  70/30 FLEXPEN) (70-30) 100 UNIT/ML FlexPen 30 units before b'fast 20 units before lunch 15 units before dinner Take the insulin  10-15 min before meals. 75 mL 4   Insulin  Pen Needle 32G X 4 MM MISC Use to inject insulin  3 times a day 300 each 3   Insulin  Syringe-Needle U-100 (BD INSULIN  SYRINGE U/F) 31G X 5/16" 1 ML MISC USE 2 DAILY 200 each 4   magnesium  oxide (MAG-OX) 400 (240 Mg) MG tablet TAKE 1 TABLET BY MOUTH TWICE A DAY 60 tablet 11   Multiple Vitamins-Minerals (PRESERVISION AREDS 2) CAPS Take 1 capsule by mouth at bedtime.     ondansetron  (ZOFRAN ) 4 MG tablet Take 4 mg by mouth every 8 (eight) hours as needed.     ONETOUCH DELICA LANCETS 33G MISC Use to check blood sugar 4 times per day. Dx code:  E11.9 200 each 2   ONETOUCH ULTRA test strip USE TO MONITOR GLUCOSE LEVELS 4 TIMES PER DAY E11.9 100 each 2   pantoprazole  (PROTONIX ) 40 MG tablet Take 1 tablet (40 mg total) by mouth 2 (two) times daily before a meal. 180 tablet 3   No current facility-administered medications on file prior to visit.   No Known Allergies Family History  Problem Relation Age of Onset   Other Mother        unknown medical history   Diabetes Father    Diabetes Paternal Grandfather    PE: BP 120/70   Pulse 78   Ht 5' (1.524 m)   Wt 160 lb (72.6 kg)   SpO2 97%   BMI 31.25 kg/m  Wt Readings from Last 3 Encounters:  05/07/24 160 lb (72.6 kg)  03/03/24 169 lb 15.6 oz (77.1 kg)  02/28/24 170 lb (77.1 kg)   Constitutional: normal weight, in NAD, in a wheelchair Eyes:  EOMI, no exophthalmos ENT: no neck masses, no cervical lymphadenopathy Cardiovascular: RRR, No MRG, + B LE swelling Respiratory: CTA B Musculoskeletal: no deformities Skin:no rashes Neurological: no tremor with outstretched hands Diabetic Foot Exam - Simple   Simple Foot Form Diabetic Foot exam was performed with the following findings: Yes 05/07/2024 10:49 AM  Visual Inspection See comments: Yes Sensation Testing See comments: Yes Pulse Check See comments: Yes Comments Dorsalis pedis pulses nonpalpable bilaterally. Patient is unable to offer information about monofilament sensation. Thick, long, onychodystrophic nails bilaterally    ASSESSMENT: 1. 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 HL  PLAN:  1. Patient with longstanding, uncontrolled, type 2 diabetes, on a premixed insulin   regimen, with poor control.  At last visit, HbA1c was still elevated, at 10.7%, and he had another HbA1c obtained 2 months ago and this was only slightly lower, at 10.2%.  At last visit, sugars were still very  high.  Reviewing CGM trends, increasing abruptly after lunch and remaining elevated throughout the night, slightly better after 3 AM, with another increase after breakfast.  Patient had problems obtaining the premixed insulin  from the pharmacy, regardless of which formulation we sent (NovoLog  70/30, Novolin  70/30, Humalog  75/25).  He was getting approximately 50% of the recommended doses so that he could ration his remaining insulin .  Unfortunately, we cannot change from the premixed insulin  since his daughter is giving him the injections and it would not be feasible for him to switch to a basal-bolus regimen. -At last visits, we discussed about healthier versions of snacks including unsalted nuts, veggies, or certain fruit with lower glycemic index like berries, apples, pears, etc. CGM interpretation: -At today's visit, we reviewed his CGM downloads from 3 weeks ago: It appears that 20% of values are in target range (goal >70%), while 80% are higher than 180 (goal <25%), and 0% are lower than 70 (goal <4%).  The calculated average blood sugar is 273.  The projected HbA1c for the next 3 months (GMI) is 9.8%. -Reviewing the CGM trends, sugars appear to be drastically increasing during the night and staying elevated throughout the day with an improvement after lunch.  Per son-in-law's report, he is now not eating very well during the day (other than a hefty breakfast), so sugars are dropping before dinner.  During the night, sugars increased as he is sneaking out snacks.  They are now starting to hide the snacks so he cannot get them during the night. -For now, my suggestion would be to stop the premixed insulin  before lunch since this is a very small meal for him, but he will probably need to take a little bit more insulin  before dinner to avoid an increase in blood sugars overnight.  Also, it would be paramount for the patient not to eat snacks in the middle of the night. - I suggested to:  Patient  Instructions  Please change NovoLog  70/30: - 30 units before b'fast - 15-20 units before dinner Take the insulin  10-15 min before meals  Try to stop eating at night.   Please return in 2-3 months.  - we checked his HbA1c: 10.9% (higher) - advised to check sugars at different times of the day - 4x a day, rotating check times - advised for yearly eye exams >> he is not UTD - he has long, thick toenails-son-in-law is planning to schedule an appointment with podiatry - return to clinic in 2-3 months  2. HL - Latest lipid panel was at goal: Lab Results  Component Value Date   CHOL 115 11/14/2023   HDL 48.10 11/14/2023   LDLCALC 45 11/14/2023   LDLDIRECT 121.0 03/25/2019   TRIG 110.0 11/14/2023   CHOLHDL 2 11/14/2023  - He continues on Lipitor 40 mg daily without side effects  Emilie Harden, MD PhD  [facility] Endocrinology

## 2024-06-14 ENCOUNTER — Other Ambulatory Visit: Payer: Self-pay | Admitting: Physician Assistant

## 2024-06-14 DIAGNOSIS — H9202 Otalgia, left ear: Secondary | ICD-10-CM

## 2024-07-11 ENCOUNTER — Other Ambulatory Visit: Payer: Self-pay | Admitting: Family

## 2024-07-11 DIAGNOSIS — H9202 Otalgia, left ear: Secondary | ICD-10-CM

## 2024-07-16 ENCOUNTER — Other Ambulatory Visit: Payer: Self-pay | Admitting: Family

## 2024-07-20 ENCOUNTER — Ambulatory Visit: Admitting: Internal Medicine

## 2024-07-21 ENCOUNTER — Other Ambulatory Visit: Payer: Self-pay

## 2024-07-21 ENCOUNTER — Other Ambulatory Visit: Payer: Self-pay | Admitting: Family

## 2024-07-21 ENCOUNTER — Emergency Department (HOSPITAL_COMMUNITY)

## 2024-07-21 ENCOUNTER — Inpatient Hospital Stay (HOSPITAL_COMMUNITY)
Admission: EM | Admit: 2024-07-21 | Discharge: 2024-07-30 | DRG: 871 | Disposition: A | Discharge status: Skilled Nursing Facility | Attending: Internal Medicine | Admitting: Internal Medicine

## 2024-07-21 DIAGNOSIS — N1832 Chronic kidney disease, stage 3b: Secondary | ICD-10-CM

## 2024-07-21 DIAGNOSIS — F0390 Unspecified dementia without behavioral disturbance: Secondary | ICD-10-CM | POA: Diagnosis present

## 2024-07-21 DIAGNOSIS — E440 Moderate protein-calorie malnutrition: Secondary | ICD-10-CM | POA: Diagnosis present

## 2024-07-21 DIAGNOSIS — Z7902 Long term (current) use of antithrombotics/antiplatelets: Secondary | ICD-10-CM

## 2024-07-21 DIAGNOSIS — H9190 Unspecified hearing loss, unspecified ear: Secondary | ICD-10-CM | POA: Diagnosis present

## 2024-07-21 DIAGNOSIS — W19XXXA Unspecified fall, initial encounter: Secondary | ICD-10-CM | POA: Diagnosis present

## 2024-07-21 DIAGNOSIS — N390 Urinary tract infection, site not specified: Secondary | ICD-10-CM | POA: Diagnosis present

## 2024-07-21 DIAGNOSIS — N179 Acute kidney failure, unspecified: Secondary | ICD-10-CM | POA: Diagnosis present

## 2024-07-21 DIAGNOSIS — H409 Unspecified glaucoma: Secondary | ICD-10-CM | POA: Diagnosis present

## 2024-07-21 DIAGNOSIS — E876 Hypokalemia: Secondary | ICD-10-CM | POA: Diagnosis present

## 2024-07-21 DIAGNOSIS — A419 Sepsis, unspecified organism: Principal | ICD-10-CM | POA: Diagnosis present

## 2024-07-21 DIAGNOSIS — J189 Pneumonia, unspecified organism: Secondary | ICD-10-CM | POA: Diagnosis not present

## 2024-07-21 DIAGNOSIS — D649 Anemia, unspecified: Secondary | ICD-10-CM | POA: Diagnosis present

## 2024-07-21 DIAGNOSIS — E1122 Type 2 diabetes mellitus with diabetic chronic kidney disease: Secondary | ICD-10-CM | POA: Diagnosis present

## 2024-07-21 DIAGNOSIS — N183 Chronic kidney disease, stage 3 unspecified: Secondary | ICD-10-CM | POA: Diagnosis present

## 2024-07-21 DIAGNOSIS — Z8673 Personal history of transient ischemic attack (TIA), and cerebral infarction without residual deficits: Secondary | ICD-10-CM | POA: Diagnosis not present

## 2024-07-21 DIAGNOSIS — Z79899 Other long term (current) drug therapy: Secondary | ICD-10-CM

## 2024-07-21 DIAGNOSIS — A4159 Other Gram-negative sepsis: Secondary | ICD-10-CM | POA: Diagnosis present

## 2024-07-21 DIAGNOSIS — E11649 Type 2 diabetes mellitus with hypoglycemia without coma: Secondary | ICD-10-CM | POA: Diagnosis present

## 2024-07-21 DIAGNOSIS — K833 Fistula of bile duct: Secondary | ICD-10-CM | POA: Diagnosis not present

## 2024-07-21 DIAGNOSIS — Z1152 Encounter for screening for COVID-19: Secondary | ICD-10-CM

## 2024-07-21 DIAGNOSIS — I441 Atrioventricular block, second degree: Secondary | ICD-10-CM | POA: Diagnosis present

## 2024-07-21 DIAGNOSIS — Z833 Family history of diabetes mellitus: Secondary | ICD-10-CM

## 2024-07-21 DIAGNOSIS — Z794 Long term (current) use of insulin: Secondary | ICD-10-CM | POA: Diagnosis not present

## 2024-07-21 DIAGNOSIS — N1831 Chronic kidney disease, stage 3a: Secondary | ICD-10-CM | POA: Diagnosis present

## 2024-07-21 DIAGNOSIS — Z6831 Body mass index (BMI) 31.0-31.9, adult: Secondary | ICD-10-CM

## 2024-07-21 DIAGNOSIS — R6521 Severe sepsis with septic shock: Secondary | ICD-10-CM

## 2024-07-21 DIAGNOSIS — C61 Malignant neoplasm of prostate: Secondary | ICD-10-CM | POA: Diagnosis present

## 2024-07-21 DIAGNOSIS — Z515 Encounter for palliative care: Secondary | ICD-10-CM | POA: Diagnosis not present

## 2024-07-21 DIAGNOSIS — G9340 Encephalopathy, unspecified: Secondary | ICD-10-CM | POA: Diagnosis not present

## 2024-07-21 DIAGNOSIS — E785 Hyperlipidemia, unspecified: Secondary | ICD-10-CM | POA: Diagnosis present

## 2024-07-21 DIAGNOSIS — Z7189 Other specified counseling: Secondary | ICD-10-CM | POA: Diagnosis not present

## 2024-07-21 DIAGNOSIS — M4856XA Collapsed vertebra, not elsewhere classified, lumbar region, initial encounter for fracture: Secondary | ICD-10-CM | POA: Diagnosis present

## 2024-07-21 DIAGNOSIS — E66811 Obesity, class 1: Secondary | ICD-10-CM | POA: Diagnosis present

## 2024-07-21 DIAGNOSIS — S32019A Unspecified fracture of first lumbar vertebra, initial encounter for closed fracture: Secondary | ICD-10-CM | POA: Diagnosis present

## 2024-07-21 DIAGNOSIS — J9601 Acute respiratory failure with hypoxia: Secondary | ICD-10-CM | POA: Diagnosis present

## 2024-07-21 DIAGNOSIS — Z711 Person with feared health complaint in whom no diagnosis is made: Secondary | ICD-10-CM | POA: Diagnosis not present

## 2024-07-21 DIAGNOSIS — R531 Weakness: Secondary | ICD-10-CM | POA: Diagnosis not present

## 2024-07-21 DIAGNOSIS — S065XAA Traumatic subdural hemorrhage with loss of consciousness status unknown, initial encounter: Secondary | ICD-10-CM | POA: Diagnosis present

## 2024-07-21 DIAGNOSIS — I10 Essential (primary) hypertension: Secondary | ICD-10-CM | POA: Diagnosis present

## 2024-07-21 DIAGNOSIS — R5383 Other fatigue: Secondary | ICD-10-CM | POA: Diagnosis not present

## 2024-07-21 DIAGNOSIS — Z66 Do not resuscitate: Secondary | ICD-10-CM | POA: Diagnosis present

## 2024-07-21 DIAGNOSIS — Z950 Presence of cardiac pacemaker: Secondary | ICD-10-CM

## 2024-07-21 DIAGNOSIS — Z87891 Personal history of nicotine dependence: Secondary | ICD-10-CM

## 2024-07-21 DIAGNOSIS — Z789 Other specified health status: Secondary | ICD-10-CM | POA: Diagnosis not present

## 2024-07-21 DIAGNOSIS — R6520 Severe sepsis without septic shock: Secondary | ICD-10-CM | POA: Diagnosis present

## 2024-07-21 DIAGNOSIS — E1165 Type 2 diabetes mellitus with hyperglycemia: Secondary | ICD-10-CM

## 2024-07-21 DIAGNOSIS — I442 Atrioventricular block, complete: Secondary | ICD-10-CM | POA: Diagnosis present

## 2024-07-21 DIAGNOSIS — E1110 Type 2 diabetes mellitus with ketoacidosis without coma: Secondary | ICD-10-CM | POA: Diagnosis present

## 2024-07-21 DIAGNOSIS — B961 Klebsiella pneumoniae [K. pneumoniae] as the cause of diseases classified elsewhere: Secondary | ICD-10-CM | POA: Diagnosis present

## 2024-07-21 DIAGNOSIS — J690 Pneumonitis due to inhalation of food and vomit: Secondary | ICD-10-CM | POA: Diagnosis present

## 2024-07-21 DIAGNOSIS — S32010A Wedge compression fracture of first lumbar vertebra, initial encounter for closed fracture: Secondary | ICD-10-CM

## 2024-07-21 DIAGNOSIS — R9431 Abnormal electrocardiogram [ECG] [EKG]: Secondary | ICD-10-CM | POA: Diagnosis present

## 2024-07-21 DIAGNOSIS — Z7982 Long term (current) use of aspirin: Secondary | ICD-10-CM

## 2024-07-21 DIAGNOSIS — Z8546 Personal history of malignant neoplasm of prostate: Secondary | ICD-10-CM

## 2024-07-21 DIAGNOSIS — G9341 Metabolic encephalopathy: Secondary | ICD-10-CM | POA: Diagnosis present

## 2024-07-21 LAB — I-STAT VENOUS BLOOD GAS, ED
Acid-base deficit: 2 mmol/L (ref 0.0–2.0)
Bicarbonate: 21.9 mmol/L (ref 20.0–28.0)
Calcium, Ion: 1.22 mmol/L (ref 1.15–1.40)
HCT: 34 % — ABNORMAL LOW (ref 39.0–52.0)
Hemoglobin: 11.6 g/dL — ABNORMAL LOW (ref 13.0–17.0)
O2 Saturation: 83 %
Potassium: 3.8 mmol/L (ref 3.5–5.1)
Sodium: 134 mmol/L — ABNORMAL LOW (ref 135–145)
TCO2: 23 mmol/L (ref 22–32)
pCO2, Ven: 33.8 mmHg — ABNORMAL LOW (ref 44–60)
pH, Ven: 7.42 (ref 7.25–7.43)
pO2, Ven: 46 mmHg — ABNORMAL HIGH (ref 32–45)

## 2024-07-21 LAB — I-STAT CHEM 8, ED
BUN: 20 mg/dL (ref 8–23)
Calcium, Ion: 1.24 mmol/L (ref 1.15–1.40)
Chloride: 98 mmol/L (ref 98–111)
Creatinine, Ser: 1.5 mg/dL — ABNORMAL HIGH (ref 0.61–1.24)
Glucose, Bld: 390 mg/dL — ABNORMAL HIGH (ref 70–99)
HCT: 36 % — ABNORMAL LOW (ref 39.0–52.0)
Hemoglobin: 12.2 g/dL — ABNORMAL LOW (ref 13.0–17.0)
Potassium: 3.8 mmol/L (ref 3.5–5.1)
Sodium: 133 mmol/L — ABNORMAL LOW (ref 135–145)
TCO2: 21 mmol/L — ABNORMAL LOW (ref 22–32)

## 2024-07-21 LAB — CBC WITH DIFFERENTIAL/PLATELET
Abs Immature Granulocytes: 0.1 K/uL — ABNORMAL HIGH (ref 0.00–0.07)
Basophils Absolute: 0.1 K/uL (ref 0.0–0.1)
Basophils Relative: 0 %
Eosinophils Absolute: 0 K/uL (ref 0.0–0.5)
Eosinophils Relative: 0 %
HCT: 33.9 % — ABNORMAL LOW (ref 39.0–52.0)
Hemoglobin: 10.9 g/dL — ABNORMAL LOW (ref 13.0–17.0)
Immature Granulocytes: 1 %
Lymphocytes Relative: 4 %
Lymphs Abs: 0.8 K/uL (ref 0.7–4.0)
MCH: 24.8 pg — ABNORMAL LOW (ref 26.0–34.0)
MCHC: 32.2 g/dL (ref 30.0–36.0)
MCV: 77 fL — ABNORMAL LOW (ref 80.0–100.0)
Monocytes Absolute: 0.9 K/uL (ref 0.1–1.0)
Monocytes Relative: 5 %
Neutro Abs: 18 K/uL — ABNORMAL HIGH (ref 1.7–7.7)
Neutrophils Relative %: 90 %
Platelets: 464 K/uL — ABNORMAL HIGH (ref 150–400)
RBC: 4.4 MIL/uL (ref 4.22–5.81)
RDW: 17.6 % — ABNORMAL HIGH (ref 11.5–15.5)
WBC: 19.8 K/uL — ABNORMAL HIGH (ref 4.0–10.5)
nRBC: 0 % (ref 0.0–0.2)

## 2024-07-21 LAB — COMPREHENSIVE METABOLIC PANEL WITH GFR
ALT: 8 U/L (ref 0–44)
AST: 19 U/L (ref 15–41)
Albumin: 2.2 g/dL — ABNORMAL LOW (ref 3.5–5.0)
Alkaline Phosphatase: 92 U/L (ref 38–126)
Anion gap: 13 (ref 5–15)
BUN: 19 mg/dL (ref 8–23)
CO2: 22 mmol/L (ref 22–32)
Calcium: 9.4 mg/dL (ref 8.9–10.3)
Chloride: 97 mmol/L — ABNORMAL LOW (ref 98–111)
Creatinine, Ser: 1.68 mg/dL — ABNORMAL HIGH (ref 0.61–1.24)
GFR, Estimated: 40 mL/min — ABNORMAL LOW (ref >60)
Glucose, Bld: 382 mg/dL — ABNORMAL HIGH (ref 70–99)
Potassium: 4 mmol/L (ref 3.5–5.1)
Sodium: 132 mmol/L — ABNORMAL LOW (ref 135–145)
Total Bilirubin: 1.1 mg/dL (ref 0.0–1.2)
Total Protein: 6.5 g/dL (ref 6.5–8.1)

## 2024-07-21 LAB — HEMOGLOBIN A1C
Hgb A1c MFr Bld: 10.7 % — ABNORMAL HIGH (ref 4.8–5.6)
Mean Plasma Glucose: 260.39 mg/dL

## 2024-07-21 LAB — CBG MONITORING, ED
Glucose-Capillary: 415 mg/dL — ABNORMAL HIGH (ref 70–99)
Glucose-Capillary: 132 mg/dL — ABNORMAL HIGH (ref 70–99)

## 2024-07-21 LAB — RESP PANEL BY RT-PCR (RSV, FLU A&B, COVID)  RVPGX2
Influenza A by PCR: NEGATIVE
Influenza B by PCR: NEGATIVE
Resp Syncytial Virus by PCR: NEGATIVE
SARS Coronavirus 2 by RT PCR: NEGATIVE

## 2024-07-21 LAB — PROTIME-INR
INR: 1.3 — ABNORMAL HIGH (ref 0.8–1.2)
Prothrombin Time: 16.5 s — ABNORMAL HIGH (ref 11.4–15.2)

## 2024-07-21 LAB — BETA-HYDROXYBUTYRIC ACID: Beta-Hydroxybutyric Acid: 0.28 mmol/L — ABNORMAL HIGH (ref 0.05–0.27)

## 2024-07-21 LAB — I-STAT CG4 LACTIC ACID, ED
Lactic Acid, Venous: 5.4 mmol/L (ref 0.5–1.9)
Lactic Acid, Venous: 4 mmol/L (ref 0.5–1.9)

## 2024-07-21 LAB — LIPASE, BLOOD: Lipase: 21 U/L (ref 11–51)

## 2024-07-21 MED ORDER — ACETAMINOPHEN 650 MG RE SUPP
650.0000 mg | Freq: Once | RECTAL | Status: AC
  Administered 2024-07-21: 650 mg via RECTAL
  Filled 2024-07-21: qty 1

## 2024-07-21 MED ORDER — INSULIN ASPART 100 UNIT/ML IJ SOLN
0.0000 [IU] | INTRAMUSCULAR | Status: DC
  Administered 2024-07-22: 2 [IU] via SUBCUTANEOUS
  Administered 2024-07-22 (×2): 1 [IU] via SUBCUTANEOUS
  Administered 2024-07-22 (×2): 2 [IU] via SUBCUTANEOUS
  Administered 2024-07-23 (×2): 1 [IU] via SUBCUTANEOUS
  Administered 2024-07-23: 3 [IU] via SUBCUTANEOUS
  Administered 2024-07-23 – 2024-07-24 (×4): 2 [IU] via SUBCUTANEOUS
  Administered 2024-07-24: 1 [IU] via SUBCUTANEOUS
  Administered 2024-07-24 – 2024-07-25 (×2): 5 [IU] via SUBCUTANEOUS
  Administered 2024-07-25 (×2): 2 [IU] via SUBCUTANEOUS
  Administered 2024-07-25: 5 [IU] via SUBCUTANEOUS
  Administered 2024-07-25: 7 [IU] via SUBCUTANEOUS
  Administered 2024-07-25: 2 [IU] via SUBCUTANEOUS
  Administered 2024-07-26: 9 [IU] via SUBCUTANEOUS
  Administered 2024-07-26: 3 [IU] via SUBCUTANEOUS
  Administered 2024-07-26: 5 [IU] via SUBCUTANEOUS
  Administered 2024-07-26: 7 [IU] via SUBCUTANEOUS
  Administered 2024-07-26: 3 [IU] via SUBCUTANEOUS
  Administered 2024-07-26: 7 [IU] via SUBCUTANEOUS
  Administered 2024-07-26: 2 [IU] via SUBCUTANEOUS
  Administered 2024-07-27: 7 [IU] via SUBCUTANEOUS
  Administered 2024-07-27: 3 [IU] via SUBCUTANEOUS
  Administered 2024-07-27: 5 [IU] via SUBCUTANEOUS
  Administered 2024-07-27: 7 [IU] via SUBCUTANEOUS
  Administered 2024-07-27: 5 [IU] via SUBCUTANEOUS
  Administered 2024-07-27: 3 [IU] via SUBCUTANEOUS
  Administered 2024-07-28: 5 [IU] via SUBCUTANEOUS
  Administered 2024-07-28: 3 [IU] via SUBCUTANEOUS
  Administered 2024-07-28: 7 [IU] via SUBCUTANEOUS
  Administered 2024-07-28: 5 [IU] via SUBCUTANEOUS
  Administered 2024-07-29: 2 [IU] via SUBCUTANEOUS
  Administered 2024-07-29: 5 [IU] via SUBCUTANEOUS
  Administered 2024-07-29: 3 [IU] via SUBCUTANEOUS
  Administered 2024-07-29: 9 [IU] via SUBCUTANEOUS
  Administered 2024-07-29: 2 [IU] via SUBCUTANEOUS
  Administered 2024-07-29: 7 [IU] via SUBCUTANEOUS
  Administered 2024-07-30: 3 [IU] via SUBCUTANEOUS
  Administered 2024-07-30: 2 [IU] via SUBCUTANEOUS

## 2024-07-21 MED ORDER — INSULIN GLARGINE-YFGN 100 UNIT/ML ~~LOC~~ SOLN
25.0000 [IU] | Freq: Every day | SUBCUTANEOUS | Status: DC
  Filled 2024-07-21: qty 0.25

## 2024-07-21 MED ORDER — LACTATED RINGERS IV BOLUS (SEPSIS)
1000.0000 mL | Freq: Once | INTRAVENOUS | Status: AC
  Administered 2024-07-21: 1000 mL via INTRAVENOUS

## 2024-07-21 MED ORDER — SODIUM CHLORIDE 0.9 % IV SOLN
2.0000 g | Freq: Once | INTRAVENOUS | Status: AC
  Administered 2024-07-21: 2 g via INTRAVENOUS
  Filled 2024-07-21: qty 12.5

## 2024-07-21 MED ORDER — VANCOMYCIN HCL 500 MG/100ML IV SOLN
500.0000 mg | Freq: Once | INTRAVENOUS | Status: AC
  Administered 2024-07-22: 500 mg via INTRAVENOUS
  Filled 2024-07-21: qty 100

## 2024-07-21 MED ORDER — LIDOCAINE HCL URETHRAL/MUCOSAL 2 % EX GEL
1.0000 | Freq: Once | CUTANEOUS | Status: AC
  Administered 2024-07-21: 1 via TOPICAL
  Filled 2024-07-21: qty 11

## 2024-07-21 MED ORDER — SODIUM CHLORIDE 0.9 % IV SOLN
3.0000 g | Freq: Four times a day (QID) | INTRAVENOUS | Status: DC
  Administered 2024-07-22 – 2024-07-24 (×10): 3 g via INTRAVENOUS
  Filled 2024-07-21 (×10): qty 8

## 2024-07-21 MED ORDER — LACTATED RINGERS IV BOLUS (SEPSIS)
250.0000 mL | Freq: Once | INTRAVENOUS | Status: AC
  Administered 2024-07-21: 250 mL via INTRAVENOUS

## 2024-07-21 MED ORDER — LACTATED RINGERS IV BOLUS
2000.0000 mL | Freq: Once | INTRAVENOUS | Status: AC
  Administered 2024-07-21: 2000 mL via INTRAVENOUS

## 2024-07-21 MED ORDER — SODIUM CHLORIDE 0.9 % IV SOLN
150.0000 mL/h | INTRAVENOUS | Status: DC
  Administered 2024-07-22: 150 mL/h via INTRAVENOUS

## 2024-07-21 MED ORDER — METRONIDAZOLE 500 MG/100ML IV SOLN
500.0000 mg | Freq: Once | INTRAVENOUS | Status: AC
  Administered 2024-07-21: 500 mg via INTRAVENOUS
  Filled 2024-07-21: qty 100

## 2024-07-21 MED ORDER — LEVETIRACETAM (KEPPRA) 500 MG/5 ML ADULT IV PUSH
500.0000 mg | Freq: Once | INTRAVENOUS | Status: AC
  Administered 2024-07-22: 500 mg via INTRAVENOUS
  Filled 2024-07-21: qty 5

## 2024-07-21 MED ORDER — VANCOMYCIN HCL IN DEXTROSE 1-5 GM/200ML-% IV SOLN
1000.0000 mg | Freq: Once | INTRAVENOUS | Status: AC
  Administered 2024-07-21: 1000 mg via INTRAVENOUS
  Filled 2024-07-21: qty 200

## 2024-07-21 NOTE — Assessment & Plan Note (Addendum)
 In the setting of subdural hematoma Neuro surgery consulted No acute surgical intervention at this time hold Plavix  repeat CT in 6 hours Keppra  500 mg  bid

## 2024-07-21 NOTE — ED Triage Notes (Signed)
 Patient arrives via [facility] for hyperglycemia and weakness. Daughter endorses increased weakness. Taking all of insulin . CBG 452. Hx of DKA. Baseline able to walk wit a walker, alert but unable to answer orientation questions. No dx of dementia.   22 L hand, 250  cc en route  120/49 HR 100 O2 98 on 6 liters

## 2024-07-21 NOTE — ED Notes (Signed)
 Patient transported to CT

## 2024-07-21 NOTE — Assessment & Plan Note (Signed)
 Difficult to place foley

## 2024-07-21 NOTE — Assessment & Plan Note (Signed)
-  SIRS criteria met with  elevated white blood cell count,       Component Value Date/Time   WBC 19.8 (H) 07/21/2024 1800   LYMPHSABS 0.8 07/21/2024 1800     ,   fever   RR >20 Today's Vitals   07/21/24 2230 07/21/24 2300 07/21/24 2317 07/21/24 2318  BP: (!) 104/38 (!) 100/53 (!) 106/47   Pulse: 66 68 69 68  Resp: 17 15 16 20   Temp:      TempSrc:      SpO2: 97% 97% 95% 98%        -Most likely source being: pulmonary,    Patient meeting criteria for Severe sepsis with    evidence of end organ damage/organ dysfunction such as    elevated lactic acid >2     Component Value Date/Time   LATICACIDVEN 4.0 (HH) 07/21/2024 2000   acute metabolic encephalopathy   Acute hypoxia requiring new supplemental oxygen, SpO2: 98 % O2 Flow Rate (L/min): 3 L/min   - Obtain serial lactic acid and procalcitonin level.  - Initiated IV antibiotics in ER: Antibiotics Given (last 72 hours)     Date/Time Action Medication Dose Rate   07/21/24 1839 New Bag/Given   ceFEPIme  (MAXIPIME ) 2 g in sodium chloride  0.9 % 100 mL IVPB 2 g 200 mL/hr   07/21/24 1937 New Bag/Given   metroNIDAZOLE  (FLAGYL ) IVPB 500 mg 500 mg 100 mL/hr   07/21/24 2017 New Bag/Given   vancomycin  (VANCOCIN ) IVPB 1000 mg/200 mL premix 1,000 mg 200 mL/hr       Will continue  on : Unasyn  and Vanco   - await results of blood and urine culture  - Rehydrate aggressively  Intravenous fluids were administered     11:32 PM

## 2024-07-21 NOTE — Assessment & Plan Note (Signed)
 Allow permissive hypertension

## 2024-07-21 NOTE — Assessment & Plan Note (Signed)
 Hold Plavix  and aspirin  given subdural hematoma

## 2024-07-21 NOTE — Assessment & Plan Note (Signed)
 Order Unasyn  obtain sputum cultures  - -Patient presenting with  productive cough, fever    hypoxia  , and infiltrate in   lower lobe on chest x-ray -Infiltrate on CXR and 2-3 characteristics (fever, leukocytosis, purulent sputum) are consistent with pneumonia. -This appears to be most likely community-acquired pneumonia.   -  Suspect aspiration given decreased mental status     Obtain:  sputum cultures,                                  influenza serologies negative                  COVID PCR negative                   blood cultures and sputum cultures ordered                   strep pneumo UA antigen,                   check for Legionella antigen.                Provide oxygen as needed.

## 2024-07-21 NOTE — Subjective & Objective (Signed)
 Patient arrives for hyperglycemia and weakness.  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

## 2024-07-21 NOTE — Assessment & Plan Note (Signed)
 this patient has acute respiratory failure with Hypoxia  as documented by the presence of following: O2 saturatio< 90% on RA   Likely due to:   Pneumonia,    Continuous pulse ox   check Pulse ox with ambulation prior to discharge   may need  TC consult for home O2 set up    flutter valve ordered

## 2024-07-21 NOTE — Progress Notes (Signed)
 Pharmacy Antibiotic Note  Andre Stout is a 84 y.o. male admitted on 07/21/2024 with sepsis and aspiration pneumonia.  Pharmacy has been consulted for vancomycin  and Unasyn  dosing.  SCr up from baseline, being rehydrated.  Plan: Rec'd vanc 1g in ED; will give additional vancomycin  500mg  to complete load and check SCr prior to redosing. Unasyn  3g IV Q6H.  Height: 5' (152.4 cm) Weight: 72.6 kg (160 lb) (from May 2025 records) IBW/kg (Calculated) : 50  Temp (24hrs), Avg:100.9 F (38.3 C), Min:98.6 F (37 C), Max:102.3 F (39.1 C)  Recent Labs  Lab 07/21/24 1800 07/21/24 1805 07/21/24 1837 07/21/24 2000  WBC 19.8*  --   --   --   CREATININE 1.68*  --  1.50*  --   LATICACIDVEN  --  5.4*  --  4.0*    Estimated Creatinine Clearance: 31.1 mL/min (A) (by C-G formula based on SCr of 1.5 mg/dL (H)).    No Known Allergies  Thank you for allowing pharmacy to be a part of this patient's care.  Marvetta Dauphin, PharmD, BCPS  07/21/2024 11:57 PM

## 2024-07-21 NOTE — Assessment & Plan Note (Addendum)
>>  ASSESSMENT AND PLAN FOR HYPERLIPIDEMIA WRITTEN ON 07/21/2024 11:27 PM BY Aarya Robinson, MD  At this point patient unable to tolerate p.o. when able to would resume Lipitor 40 mg daily   >>ASSESSMENT AND PLAN FOR UNCONTROLLED TYPE 2 DIABETES MELLITUS WITH HYPERGLYCEMIA, WITH LONG-TERM CURRENT USE OF INSULIN  ([facility]) WRITTEN ON 07/21/2024 11:01 PM BY Rafiel Mecca, MD  No evidence of DKA at this time Order sliding scale And insulin  Lantus  25 units

## 2024-07-21 NOTE — H&P (Signed)
 Andre Stout FMW:[id number] DOB: 06/04/1940 DOA: 07/21/2024     PCP: Jason Leita Repine, FNP   Outpatient Specialists:  CARDS:  Dr. Darryle ONEIDA Decent, MD    Patient arrived to ER on 07/21/24 at 1707 Referred by Attending Dreama Longs, MD  Patient coming from:    home Lives  With family   Chief Complaint:  Chief Complaint  Patient presents with   Hyperglycemia    HPI: Andre Stout is a 84 y.o. 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 Sp percutaneous biliary drain    Presented with  encephalopathy Patient arrives for hyperglycemia and weakness.  Family has had increased generalized fatigue Recently was taken off of insulin  and his CBG was elevated 452 History of DKA in the past at baseline able to walk with a walker and has history of dementia blood pressure 120/49 Satting 98% on 6 L Patient unable to provide his own history appears to be sick Noted to have lactic acid of 4 tachypneic and febrile Lethargic with abdominal tenderness But sugar elevated to 415 IV fluid administered started on cefepime  metronidazole  vancomycin  Chest x-ray showing aspiration pneumonia and cardiomegaly As well as new L1 compression fracture Gas in the bladder CT head showing acute left posterior convexity subdural hematoma no midline shift    Family states patient had a fall about a week ago few days ago started having more nausea and vomiting had increased depth and rate of respirations family was suspicious that he may have DKA and brought him to emergency department Last dose of Plavix  at 11 AM 07/21/2024  Denies significant ETOH intake  Does not smoke      Regarding pertinent Chronic problems:    Hyperlipidemia - on statins Lipitor (atorvastatin )  Lipid Panel     Component Value Date/Time   CHOL 115 11/14/2023 1058   TRIG 110.0 11/14/2023 1058    HDL 48.10 11/14/2023 1058   CHOLHDL 2 11/14/2023 1058   VLDL 22.0 11/14/2023 1058   LDLCALC 45 11/14/2023 1058   LDLDIRECT 121.0 03/25/2019 1008     HTN on Norvasc    last echo  Recent Results (from the past 56199 hours)  ECHOCARDIOGRAM COMPLETE   Collection Time: 01/19/24  1:00 PM  Result Value   Weight 2,640   Height 65   BP 97/61   S' Lateral 1.90   Area-P 1/2 6.11   Est EF 65 - 70%   Narrative      ECHOCARDIOGRAM REPORT         1. Left ventricular ejection fraction, by estimation, is 65 to 70%. The left ventricle has normal function. The left ventricle has no regional wall motion abnormalities. There is mild left ventricular hypertrophy. Indeterminate diastolic filling due to  E-A fusion.  2. Right ventricular systolic function is hyperdynamic. The right ventricular size is normal.  3. Left atrial size was moderately dilated.  4. The mitral valve is normal in structure. Trivial mitral valve regurgitation. No evidence of mitral stenosis.  5. The aortic valve is tricuspid. There is mild thickening of the aortic valve. Aortic valve regurgitation is not visualized. Aortic valve sclerosis is present, with no evidence of aortic valve stenosis.  6. The inferior vena cava is normal in size with greater than 50% respiratory variability, suggesting right atrial pressure of 3 mmHg.         DM 2 -  Lab Results  Component Value Date   HGBA1C 10.9 (A) 05/07/2024   on insulin       Hx of CVA -  with residual deficits on Aspirin  81 mg,  , Plavix     CKD stage IIIb  baseline Cr 1.5 CrCl cannot be calculated (Unknown ideal weight.).  Lab Results  Component Value Date   CREATININE 1.50 (H) 07/21/2024   CREATININE 1.68 (H) 07/21/2024   CREATININE 0.96 03/05/2024   Lab Results  Component Value Date   NA 133 (L) 07/21/2024   NA 134 (L) 07/21/2024   CL 98 07/21/2024   K 3.8 07/21/2024   K 3.8 07/21/2024   CO2 22 07/21/2024   BUN 20 07/21/2024   CREATININE 1.50 (H) 07/21/2024    GFRNONAA 40 (L) 07/21/2024   CALCIUM  9.4 07/21/2024   PHOS 3.1 01/17/2024   ALBUMIN  2.2 (L) 07/21/2024   GLUCOSE 390 (H) 07/21/2024    Chronic anemia - baseline hg Hemoglobin & Hematocrit  Recent Labs    03/05/24 0509 07/21/24 1800 07/21/24 1837  HGB 13.1 10.9* 11.6*  12.2*   Iron/TIBC/Ferritin/ %Sat No results found for: IRON, TIBC, FERRITIN, IRONPCTSAT   Cancer: Prostate cancer   While in ER: Clinical Course as of 07/21/24 2252  Tue Jul 21, 2024  1934 Patient now much more alert.  He is speaking in sentences but is still confused and appears to also be very hard of hearing.  He is far more alert than previous evaluation [AH]  2007 Beta-Hydroxybutyric Acid(!): 0.28 [AH]  2131 CT Head Wo Contrast CT head with 8 mm subdural hematoma.  Patient appears to be on Plavix  [AH]  2205  [facility] plavix  repeat scan in 6 hrs Systolic <160 keppra  500 bid [AH]    Clinical Course User Index [AH] Arloa Chroman, PA-C     In ER decreased urine output, hx of prostate cancer not able to place foley    Lab Orders         Culture, blood (Routine x 2)         Resp panel by RT-PCR (RSV, Flu A&B, Covid) Anterior Nasal Swab         Comprehensive metabolic panel         CBC with Differential         Protime-INR         Urinalysis, w/ Reflex to Culture (Infection Suspected) -Urine, Clean Catch         Beta-hydroxybutyric acid         Lipase, blood         CBG monitoring, ED         I-Stat Lactic Acid, ED         I-stat chem 8, ED (not at [facility], [facility] or [facility])         I-Stat venous blood gas, ([facility] ED, [facility], [facility])      CT HEADAcute left posterior convexity subdural hematoma, measuring up to 8 mm, without midline shift.     CXR - Patchy ill-defined airspace opacities in the right mid to upper lung suspicious for pneumonia. Radiographic follow-up to resolution is recommended.  CTabd/pelvis chest -     -  Multifocal patchy right lung opacities, suspicious for pneumonia,  possibly on the basis of aspiration. 2. Moderate compression fracture deformity at L1, new from prior and possibly acute, with minimal retropulsion. 3. Moderate nondependent gas in the bladder, correlate for cystitis versus instrumentation.    Following Medications were ordered in ER: Medications  acetaminophen  (  TYLENOL ) suppository 650 mg (650 mg Rectal Given 07/21/24 1816)  lactated ringers  bolus 2,000 mL (0 mLs Intravenous Stopped 07/21/24 1938)  ceFEPIme  (MAXIPIME ) 2 g in sodium chloride  0.9 % 100 mL IVPB (0 g Intravenous Stopped 07/21/24 1939)  metroNIDAZOLE  (FLAGYL ) IVPB 500 mg (0 mg Intravenous Stopped 07/21/24 2055)  vancomycin  (VANCOCIN ) IVPB 1000 mg/200 mL premix (0 mg Intravenous Stopped 07/21/24 2140)  lactated ringers  bolus 1,000 mL (0 mLs Intravenous Stopped 07/21/24 2021)    And  lactated ringers  bolus 1,000 mL (0 mLs Intravenous Stopped 07/21/24 2219)    And  lactated ringers  bolus 250 mL (0 mLs Intravenous Stopped 07/21/24 2055)  lidocaine  (XYLOCAINE ) 2 % jelly 1 Application (1 Application Topical Given by Other 07/21/24 2000)    _______________________________________________________ ER Provider Called:     neurosurgery  Dr.Tomlinson They Recommend admit to medicine repeat CT in 6 h to moniotr for progression and hold Plavix     ED Triage Vitals [07/21/24 1731]  Encounter Vitals Group     BP 107/68     Girls Systolic BP Percentile      Girls Diastolic BP Percentile      Boys Systolic BP Percentile      Boys Diastolic BP Percentile      Pulse Rate 88     Resp (!) 25     Temp (!) 101.8 F (38.8 C)     Temp Source Oral     SpO2 93 %     Weight      Height      Head Circumference      Peak Flow      Pain Score      Pain Loc      Pain Education      Exclude from Growth Chart   TMAX(24)@     _________________________________________ Significant initial  Findings: Abnormal Labs Reviewed  COMPREHENSIVE METABOLIC PANEL WITH GFR - Abnormal; Notable for the following  components:      Result Value   Sodium 132 (*)    Chloride 97 (*)    Glucose, Bld 382 (*)    Creatinine, Ser 1.68 (*)    Albumin  2.2 (*)    GFR, Estimated 40 (*)    All other components within normal limits  CBC WITH DIFFERENTIAL/PLATELET - Abnormal; Notable for the following components:   WBC 19.8 (*)    Hemoglobin 10.9 (*)    HCT 33.9 (*)    MCV 77.0 (*)    MCH 24.8 (*)    RDW 17.6 (*)    Platelets 464 (*)    Neutro Abs 18.0 (*)    Abs Immature Granulocytes 0.10 (*)    All other components within normal limits  PROTIME-INR - Abnormal; Notable for the following components:   Prothrombin Time 16.5 (*)    INR 1.3 (*)    All other components within normal limits  BETA-HYDROXYBUTYRIC ACID - Abnormal; Notable for the following components:   Beta-Hydroxybutyric Acid 0.28 (*)    All other components within normal limits  CBG MONITORING, ED - Abnormal; Notable for the following components:   Glucose-Capillary 415 (*)    All other components within normal limits  I-STAT CG4 LACTIC ACID, ED - Abnormal; Notable for the following components:   Lactic Acid, Venous 5.4 (*)    All other components within normal limits  I-STAT CHEM 8, ED - Abnormal; Notable for the following components:   Sodium 133 (*)    Creatinine, Ser 1.50 (*)  Glucose, Bld 390 (*)    TCO2 21 (*)    Hemoglobin 12.2 (*)    HCT 36.0 (*)    All other components within normal limits  I-STAT VENOUS BLOOD GAS, ED - Abnormal; Notable for the following components:   pCO2, Ven 33.8 (*)    pO2, Ven 46 (*)    Sodium 134 (*)    HCT 34.0 (*)    Hemoglobin 11.6 (*)    All other components within normal limits  I-STAT CG4 LACTIC ACID, ED - Abnormal; Notable for the following components:   Lactic Acid, Venous 4.0 (*)    All other components within normal limits       ECG: Ordered Personally reviewed and interpreted by me showing: HR : 94 Rhythm:unclear rhythm , concern for wenkebach Ventricular premature  complex Inferior infarct, old Anterior infarct, old Lateral leads are also involved Prolonged QT interval No significant change since last tracing QTC 417   ____________________ This patient meets SIRS Criteria and may be septic.   The recent clinical data is shown below. Vitals:   07/21/24 2130 07/21/24 2145 07/21/24 2200 07/21/24 2220  BP: (!) 116/58 (!) 107/53 (!) 108/51 (!) 103/53  Pulse: 74 69 70 70  Resp: (!) 21 20 (!) 22 19  Temp:      TempSrc:      SpO2: 96% 95% 99% 96%      WBC     Component Value Date/Time   WBC 19.8 (H) 07/21/2024 1800   LYMPHSABS 0.8 07/21/2024 1800   MONOABS 0.9 07/21/2024 1800   EOSABS 0.0 07/21/2024 1800   BASOSABS 0.1 07/21/2024 1800    Lactic Acid, Venous    Component Value Date/Time   LATICACIDVEN 4.0 (HH) 07/21/2024 2000     Procalcitonin   Ordered     UA  ordered    Results for orders placed or performed during the hospital encounter of 07/21/24  Resp panel by RT-PCR (RSV, Flu A&B, Covid) Anterior Nasal Swab     Status: None   Collection Time: 07/21/24  6:17 PM   Specimen: Anterior Nasal Swab  Result Value Ref Range Status   SARS Coronavirus 2 by RT PCR NEGATIVE NEGATIVE Final   Influenza A by PCR NEGATIVE NEGATIVE Final   Influenza B by PCR NEGATIVE NEGATIVE Final         Resp Syncytial Virus by PCR NEGATIVE NEGATIVE Final          ABX started Antibiotics Given (last 72 hours)     Date/Time Action Medication Dose Rate   07/21/24 1839 New Bag/Given   ceFEPIme  (MAXIPIME ) 2 g in sodium chloride  0.9 % 100 mL IVPB 2 g 200 mL/hr   07/21/24 1937 New Bag/Given   metroNIDAZOLE  (FLAGYL ) IVPB 500 mg 500 mg 100 mL/hr   07/21/24 2017 New Bag/Given   vancomycin  (VANCOCIN ) IVPB 1000 mg/200 mL premix 1,000 mg 200 mL/hr        No results found for the last 90 days.    ____ Venous  Blood Gas result:  pH  7.420 Sodium 134 Low  mmol/L   pCO2, Ven 33.8 Low  mmHg Potassium 3.8 mmol/L  pO2, Ven 46 High  mmHg        __________________________________________________________ Recent Labs  Lab 07/21/24 1800 07/21/24 1837  NA 132* 134*  133*  K 4.0 3.8  3.8  CO2 22  --   GLUCOSE 382* 390*  BUN 19 20  CREATININE 1.68* 1.50*  CALCIUM  9.4  --  Cr  up from baseline Lab Results  Component Value Date   CREATININE 1.50 (H) 07/21/2024   CREATININE 1.68 (H) 07/21/2024   CREATININE 0.96 03/05/2024    Recent Labs  Lab 07/21/24 1800  AST 19  ALT 8  ALKPHOS 92  BILITOT 1.1  PROT 6.5  ALBUMIN  2.2*   Lab Results  Component Value Date   CALCIUM  9.4 07/21/2024   PHOS 3.1 01/17/2024        Plt: Lab Results  Component Value Date   PLT 464 (H) 07/21/2024       Recent Labs  Lab 07/21/24 1800 07/21/24 1837  WBC 19.8*  --   NEUTROABS 18.0*  --   HGB 10.9* 11.6*  12.2*  HCT 33.9* 34.0*  36.0*  MCV 77.0*  --   PLT 464*  --     HG/HCT  stable,      Component Value Date/Time   HGB 12.2 (L) 07/21/2024 1837   HGB 11.6 (L) 07/21/2024 1837   HCT 36.0 (L) 07/21/2024 1837   HCT 34.0 (L) 07/21/2024 1837   MCV 77.0 (L) 07/21/2024 1800   Recent Labs  Lab 07/21/24 1800  LIPASE 21   No results for input(s): AMMONIA in the last 168 hours.    _______________________________________________ Hospitalist was called for admission for  sepsis ,aspiration PNA, subdural hematoma   The following Work up has been ordered so far:  Orders Placed This Encounter  Procedures   Culture, blood (Routine x 2)   Resp panel by RT-PCR (RSV, Flu A&B, Covid) Anterior Nasal Swab   DG Chest Port 1 View   CT CHEST ABDOMEN PELVIS WO CONTRAST   CT Head Wo Contrast   Comprehensive metabolic panel   CBC with Differential   Protime-INR   Urinalysis, w/ Reflex to Culture (Infection Suspected) -Urine, Clean Catch   Beta-hydroxybutyric acid   Lipase, blood   Diet NPO time specified   Notify physician (specify)  Specify: Notify provider for possible Code Sepsis   Document height and weight   Assess and  Document Glasgow Coma Scale   Document vital signs within 1-hour of fluid bolus completion. Notify provider of abnormal vital signs despite fluid resuscitation.   DO NOT delay antibiotics if unable to obtain blood culture.   Refer to Sidebar Report: Sepsis Sidebar ED/IP   Notify provider for difficulties obtaining IV access.   Insert peripheral IV x 2   Initiate Carrier Fluid Protocol   Bladder scan   Apply TLSO brace   Maintain TLSO brace   monitoring by pharmacy   Consult to neurosurgery   Consult to intensivist   Consult to hospitalist   CBG monitoring, ED   I-Stat Lactic Acid, ED   I-stat chem 8, ED (not at [facility], [facility] or [facility])   I-Stat venous blood gas, ([facility], [facility], [facility])   EKG 12-Lead   ED EKG     OTHER Significant initial  Findings:  labs showing:     DM  labs:  HbA1C: Recent Labs    11/14/23 1044 03/05/24 0509 05/07/24 1031  HGBA1C 10.7* 10.2* 10.9*       CBG (last 3)  Recent Labs    07/21/24 1725  GLUCAP 415*          Cultures:    Component Value Date/Time   SDES BLOOD LEFT HAND 01/18/2024 1546   SPECREQUEST  01/18/2024 1546    BOTTLES DRAWN AEROBIC AND ANAEROBIC Blood Culture results may not be optimal due to an  inadequate volume of blood received in culture bottles   CULT  01/18/2024 1546    NO GROWTH 5 DAYS Performed at [facility], [address], [place], [place] [address]    REPTSTATUS 01/23/2024 FINAL 01/18/2024 1546     Radiological Exams on Admission: CT CHEST ABDOMEN PELVIS WO CONTRAST Result Date: 07/21/2024 EXAM: CT CHEST, ABDOMEN AND PELVIS WITHOUT CONTRAST 07/21/2024 09:21:31 PM TECHNIQUE: CT of the chest, abdomen and pelvis was performed without the administration of intravenous contrast. Multiplanar reformatted images are provided for review. Automated exposure control, iterative reconstruction, and/or weight based adjustment of the mA/kV was utilized to reduce the radiation dose to as low as reasonably achievable. COMPARISON:  CT abdomen dated 03/03/24. CLINICAL HISTORY: Sepsis, hyperglycemia and weakness. Daughter endorses increased weakness. Taking all of insulin . CBG 452. Hx of DKA. Baseline able to walk with a walker, alert but unable to answer orientation questions. No dx of dementia. FINDINGS: Motion degraded images. CHEST: MEDIASTINUM: Heart and pericardium are unremarkable. The central airways are clear. Mild 3-vessel coronary atherosclerosis. Mild thoracic aortic atherosclerosis. THORACIC LYMPH NODES: No mediastinal, hilar or axillary lymphadenopathy. LUNGS AND PLEURA: Multifocal patchy right lung opacities, suspicious for pneumonia possibly on the basis of aspiration. No pleural effusion or pneumothorax. ABDOMEN AND PELVIS: LIVER: The liver is unremarkable. GALLBLADDER AND BILE DUCTS: Layering small gallstones (image 86), without associated inflammatory changes. No biliary ductal dilatation. SPLEEN: No acute abnormality. PANCREAS: No acute abnormality. ADRENAL GLANDS: No acute abnormality. KIDNEYS, URETERS AND BLADDER: 10.0 cm simple right renal cyst (image 67), benign (Bosniak 1). Per consensus, no follow-up is needed for simple Bosniak type 1 and 2 renal cysts, unless the patient has a malignancy history or risk factors. No stones in the kidneys or ureters. No hydronephrosis. No perinephric or periureteral stranding. Moderate nondependent gas in the bladder, correlate for a cystitis versus instrumentation. GI AND BOWEL: Stomach demonstrates no acute abnormality. There is no bowel obstruction. Appendix is not discretely visualized. REPRODUCTIVE ORGANS: Brachytherapy seeds in the prostate. PERITONEUM AND RETROPERITONEUM: No ascites. No free air. VASCULATURE: Atherosclerotic calcifications of the abdominal aorta and branch vessels. ABDOMINAL AND PELVIS LYMPH NODES: No lymphadenopathy. REPRODUCTIVE ORGANS: No acute abnormality. BONES AND SOFT TISSUES: Mild degenerative changes of the thoracic spine. Moderate compression fracture  deformity at L1 (sagittal image 61), new from prior and possibly acute, with minimal retropulsion. IMPRESSION: 1. Multifocal patchy right lung opacities, suspicious for pneumonia, possibly on the basis of aspiration. 2. Moderate compression fracture deformity at L1, new from prior and possibly acute, with minimal retropulsion. 3. Moderate nondependent gas in the bladder, correlate for cystitis versus instrumentation. Electronically signed by: Pinkie Pebbles MD 07/21/2024 09:33 PM EDT RP Workstation: [id number]   CT Head Wo Contrast Result Date: 07/21/2024 EXAM: CT HEAD WITHOUT CONTRAST 07/21/2024 09:21:31 PM TECHNIQUE: CT of the head was performed without the administration of intravenous contrast. Automated exposure control, iterative reconstruction, and/or weight based adjustment of the mA/kV was utilized to reduce the radiation dose to as low as reasonably achievable. COMPARISON: 01/17/2024 CLINICAL HISTORY: Mental status change, unknown cause. Hyperglycemia and weakness. Daughter endorses increased weakness. Taking all of insulin . CBG 452. Hx of DKA. Baseline able to walk with a walker, alert but unable to answer orientation questions. No dx of dementia. FINDINGS: BRAIN AND VENTRICLES: Global cortical atrophy. Subcortical and periventricular small vessel ischemic changes. No midline shift. ORBITS: No acute abnormality. SINUSES: No acute abnormality. SOFT TISSUES AND SKULL: Left posterior convexity subdural hematoma, measuring up to 8 mm in  thickness on coronal imaging, acute. Chronic opacification of the left mastoid air cells. IMPRESSION: 1. Acute left posterior convexity subdural hematoma, measuring up to 8 mm, without midline shift. 2. Critical Value/emergent results were called by telephone at the time of interpretation on 07/21/2024 at 2128 hrs to provider Dr Dreama, who verbally acknowledged these results. Electronically signed by: Pinkie Pebbles MD 07/21/2024 09:30 PM EDT RP Workstation:  [id number]   DG Chest Port 1 View Result Date: 07/21/2024 CLINICAL DATA:  Sepsis weakness EXAM: PORTABLE CHEST 1 VIEW COMPARISON:  01/17/2024 FINDINGS: Left lung grossly clear. Patchy ill-defined airspace opacities in the right mid to upper lung. Cardiomegaly with aortic atherosclerosis. No pleural effusion or pneumothorax IMPRESSION: Patchy ill-defined airspace opacities in the right mid to upper lung suspicious for pneumonia. Radiographic follow-up to resolution is recommended. Cardiomegaly Electronically Signed   By: Luke Bun M.D.   On: 07/21/2024 18:49   _______________________________________________________________________________________________________ Latest  Blood pressure (!) 103/53, pulse 70, temperature 98.6 F (37 C), temperature source Axillary, resp. rate 19, SpO2 96%.   Vitals  labs and radiology finding personally reviewed  Review of Systems:    Pertinent positives include:  nausea, vomiting, increased rate of breathing falls  Constitutional:  No weight loss, night sweats, Fevers, chills, fatigue, weight loss  HEENT:  No headaches, Difficulty swallowing,Tooth/dental problems,Sore throat,  No sneezing, itching, ear ache, nasal congestion, post nasal drip,  Cardio-vascular:  No chest pain, Orthopnea, PND, anasarca, dizziness, palpitations.no Bilateral lower extremity swelling  GI:  No heartburn, indigestion, abdominal pain,  diarrhea, change in bowel habits, loss of appetite, melena, blood in stool, hematemesis Resp:  no shortness of breath at rest. No dyspnea on exertion, No excess mucus, no productive cough, No non-productive cough, No coughing up of blood.No change in color of mucus.No wheezing. Skin:  no rash or lesions. No jaundice GU:  no dysuria, change in color of urine, no urgency or frequency. No straining to urinate.  No flank pain.  Musculoskeletal:  No joint pain or no joint swelling. No decreased range of motion. No back pain.  Psych:  No change in  mood or affect. No depression or anxiety. No memory loss.  Neuro: no localizing neurological complaints, no tingling, no weakness, no double vision, no gait abnormality, no slurred speech, no confusion  All systems reviewed and apart from HOPI all are negative _______________________________________________________________________________________________ Past Medical History:   Past Medical History:  Diagnosis Date   Diabetes mellitus without complication (HCC)    Glaucoma    Heart block    complete heart block   Hypertension    Prostate cancer (HCC)    Been 3-4 years ago      Past Surgical History:  Procedure Laterality Date   CATARACT EXTRACTION Bilateral    INSERTION PROSTATE RADIATION SEED     IR EXCHANGE BILIARY DRAIN  08/19/2023   IR EXCHANGE BILIARY DRAIN  12/19/2023   IR EXCHANGE BILIARY DRAIN  01/20/2024   IR PERC CHOLECYSTOSTOMY  08/17/2022   IR RADIOLOGIST EVAL & MGMT  09/28/2022   IR RADIOLOGIST EVAL & MGMT  10/10/2022    Social History:  Ambulatory  walker  2 person assist    reports that he quit smoking about 49 years ago. His smoking use included cigarettes. He started smoking about 53 years ago. He has a 2 pack-year smoking history. He has never used smokeless tobacco. He reports that he does not drink alcohol and does not use drugs.     Family History:  Family History  Problem Relation Age of Onset   Other Mother        unknown medical history   Diabetes Father    Diabetes Paternal Grandfather    ______________________________________________________________________________________________ Allergies: No Known Allergies   Prior to Admission medications   Medication Sig Start Date End Date Taking? Authorizing Provider  acetaminophen  (TYLENOL ) 500 MG tablet Take 1,000 mg by mouth daily as needed (pain).    [provider]  amLODipine  (NORVASC ) 10 MG tablet Take 1 tablet (10 mg total) by mouth daily. 11/15/23   Jason Leita Repine, FNP   aspirin  81 MG chewable tablet Chew 81 mg by mouth daily. 01/06/22   [provider]  atorvastatin  (LIPITOR) 40 MG tablet TAKE 1 TABLET BY MOUTH EVERY DAY 07/16/24   Murray, Laura Woodruff, FNP  Azelastine  HCl 137 MCG/SPRAY SOLN PLACE 1 SPRAY INTO BOTH NOSTRILS 2 (TWO) TIMES DAILY AS NEEDED. NEEDS APPT 07/13/24   Jason Leita Repine, FNP  Blood Glucose Monitoring Suppl (ACCU-CHEK GUIDE) w/Device KIT Use As Directed 08/24/22   Christobal Guadalajara, MD  clopidogrel  (PLAVIX ) 75 MG tablet TAKE 1 TABLET BY MOUTH EVERY DAY 07/16/24   Jason Leita Repine, FNP  Continuous Blood Gluc Receiver (FREESTYLE LIBRE 3 READER) DEVI 1 each by Does not apply route daily. 01/22/23   Trixie File, MD  Continuous Blood Gluc Sensor (FREESTYLE LIBRE 3 SENSOR) MISC 1 each by Does not apply route every 14 (fourteen) days. 01/22/23   Trixie File, MD  insulin  aspart protamine - aspart (NOVOLOG  70/30 FLEXPEN) (70-30) 100 UNIT/ML FlexPen Inject under skin 30 units before b'fast and 15-20 units before dinner Take the insulin  10-15 min before meals. 05/07/24   Trixie File, MD  Insulin  Pen Needle 32G X 4 MM MISC Use to inject insulin  3 times a day 11/15/23   Trixie File, MD  Insulin  Syringe-Needle U-100 (BD INSULIN  SYRINGE U/F) 31G X 5/16 1 ML MISC USE 2 DAILY 11/21/22   Trixie File, MD  magnesium  oxide (MAG-OX) 400 (240 Mg) MG tablet TAKE 1 TABLET BY MOUTH TWICE A DAY 02/28/24   Jason Leita Repine, FNP  Multiple Vitamins-Minerals (PRESERVISION AREDS 2) CAPS Take 1 capsule by mouth at bedtime.    [provider]  ondansetron  (ZOFRAN ) 4 MG tablet Take 4 mg by mouth every 8 (eight) hours as needed. 12/01/23   [provider]  AISHA PASTOR LANCETS 33G MISC Use to check blood sugar 4 times per day. Dx code: E11.9 03/27/16   Kassie Mallick, MD  [facility] ULTRA test strip USE TO MONITOR GLUCOSE LEVELS 4 TIMES PER DAY E11.9 06/10/19   Kassie Mallick, MD  pantoprazole  (PROTONIX ) 40 MG tablet Take 1  tablet (40 mg total) by mouth 2 (two) times daily before a meal. 11/15/23   Jason Leita Repine, FNP    ___________________________________________________________________________________________________ Physical Exam:    07/21/2024   10:20 PM 07/21/2024   10:00 PM 07/21/2024    9:45 PM  Vitals with BMI  Systolic 103 108 892  Diastolic 53 51 53  Pulse 70 70 69     1. General:  in No  Acute distress   Chronically ill   -appearing 2. Psychological: Alert and   Oriented to self 3. Head/ENT:   Dry Mucous Membranes                          Head Non traumatic, neck supple  Poor Dentition 4. SKIN:  decreased Skin turgor,  Skin clean Dry and intact no rash    5. Heart: Regular rate and rhythm no Murmur, no Rub or gallop 6. Lungs: no wheezes or crackles   7. Abdomen: Soft, non-tender, Non distended bowel sounds present 8. Lower extremities: no clubbing, cyanosis, no  edema 9. Neurologically Grossly intact, moving all 4 extremities equally   10. MSK: Normal range of motion    Chart has been reviewed  ______________________________________________________________________________________________  Assessment/Plan 84 y.o. 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 Sp percutaneous biliary drain  Admitted for sepsis ,aspiration PNA, subdural hematoma   Present on Admission:  Prolonged QT interval  Acute encephalopathy  Benign essential HTN  Hyperlipidemia  Prostate cancer (HCC)  Sepsis (HCC)  Aspiration pneumonia (HCC)  Acute respiratory failure with hypoxia (HCC)  Mobitz type 1 second degree atrioventricular block  CKD (chronic kidney disease) stage 3, GFR 30-59 ml/min (HCC)  Subdural hematoma (HCC)  L1 vertebral fracture (HCC)  Biliary fistula  Heart block AV complete (HCC)     Prolonged QT interval - will monitor on tele  avoid QT prolonging medications, rehydrate correct electrolytes   Uncontrolled type 2 diabetes mellitus with hyperglycemia, with long-term current use of insulin  (HCC) No evidence of DKA at this time Order sliding scale And insulin  Lantus  25 units  Acute encephalopathy In the setting of subdural hematoma Neuro surgery consulted No acute surgical intervention at this time hold Plavix  repeat CT in 6 hours Keppra  500 mg  bid  Benign essential HTN Allow permissive hypertension  History of stroke Hold Plavix  and aspirin  given subdural hematoma  Hyperlipidemia At this point patient unable to tolerate p.o. when able to would resume Lipitor 40 mg daily  Prostate cancer (HCC) Difficult to place foley  Sepsis (HCC)  -SIRS criteria met with  elevated white blood cell count,       Component Value Date/Time   WBC 19.8 (H) 07/21/2024 1800   LYMPHSABS 0.8 07/21/2024 1800     ,   fever   RR >20 Today's Vitals   07/21/24 2230 07/21/24 2300 07/21/24 2317 07/21/24 2318  BP: (!) 104/38 (!) 100/53 (!) 106/47   Pulse: 66 68 69 68  Resp: 17 15 16 20   Temp:      TempSrc:      SpO2: 97% 97% 95% 98%        -Most likely source being: pulmonary,    Patient meeting criteria for Severe sepsis with    evidence of end organ damage/organ dysfunction such as    elevated lactic acid >2     Component Value Date/Time   LATICACIDVEN 4.0 (HH) 07/21/2024 2000   acute metabolic encephalopathy   Acute hypoxia requiring new supplemental oxygen, SpO2: 98 % O2 Flow Rate (L/min): 3 L/min   - Obtain serial lactic acid and procalcitonin level.  - Initiated IV antibiotics in ER: Antibiotics Given (last 72 hours)     Date/Time Action Medication Dose Rate   07/21/24 1839 New Bag/Given   ceFEPIme  (MAXIPIME ) 2 g in sodium chloride  0.9 % 100 mL IVPB 2 g 200 mL/hr   07/21/24 1937 New Bag/Given   metroNIDAZOLE  (FLAGYL ) IVPB 500 mg 500 mg 100 mL/hr   07/21/24 2017 New Bag/Given   vancomycin  (VANCOCIN ) IVPB  1000 mg/200 mL premix 1,000 mg 200 mL/hr       Will continue  on : Unasyn   and Vanco   - await results of blood and urine culture  - Rehydrate aggressively  Intravenous fluids were administered     11:32 PM   Aspiration pneumonia (HCC) Order Unasyn  obtain sputum cultures  - -Patient presenting with  productive cough, fever    hypoxia  , and infiltrate in   lower lobe on chest x-ray -Infiltrate on CXR and 2-3 characteristics (fever, leukocytosis, purulent sputum) are consistent with pneumonia. -This appears to be most likely community-acquired pneumonia.   -  Suspect aspiration given decreased mental status     Obtain:  sputum cultures,                                  influenza serologies negative                  COVID PCR negative                   blood cultures and sputum cultures ordered                   strep pneumo UA antigen,                   check for Legionella antigen.                Provide oxygen as needed.    Acute respiratory failure with hypoxia (HCC)  this patient has acute respiratory failure with Hypoxia as documented by the presence of following: O2 saturatio< 90% on RA   Likely due to:   Pneumonia,    Continuous pulse ox   check Pulse ox with ambulation prior to discharge    may need  TC consult for home O2 set up    flutter valve ordered   Mobitz type 1 second degree atrioventricular block Followed by cardiology no pacemaker  CKD (chronic kidney disease) stage 3, GFR 30-59 ml/min (HCC)  -chronic avoid nephrotoxic medications such as NSAIDs, Vanco Zosyn  combo,  avoid hypotension, continue to follow renal function   L1 vertebral fracture (HCC) TLCO in place, pain control  Neurosurgery is aware  Biliary fistula Drain in place  Heart block AV complete [facility]) Did not had a pacemaker placed, asymptomatic  Followed by cardiology   Other plan as per orders.  DVT prophylaxis:  SCD     Code Status:    Code Status: Prior FULL CODE as per  patient   I had personally discussed CODE STATUS with  family  ACP   none   Family Communication:   Family   at  Bedside  plan of care was discussed on the phone with   Daughter,    Diet  Diet Orders (From admission, onward)     Start     Ordered   07/21/24 1817  Diet NPO time specified  (Septic presentation on arrival (screening labs, nursing and treatment orders for obvious sepsis))  Diet effective now        07/21/24 1818            Disposition Plan:     likely will need placement for rehabilitation                            Following barriers for discharge:  Electrolytes corrected                               Anemia   stable                             Pain controlled with PO medications                               Afebrile, white count improving able to transition to PO antibiotics                             Will need to be able to tolerate PO                                                        Will need consultants to evaluate patient prior to discharge                   Consult Orders  (From admission, onward)           Start     Ordered   07/21/24 2227  Consult to hospitalist  Pg sent by deloris  Once       Provider:  (Not yet assigned)  Question Answer Comment  Place call to: [facility]   Reason for Consult Admit      07/21/24 2226                               Would benefit from PT/OT eval prior to DC  Ordered                                       Diabetes care coordinator                   Transition of care consulted                   Nutrition    consulted                                     Palliative care    consulted    Consults called: Neurosurgery is aware Treatment Team:  Lanis Pupa, MD  Admission status:  ED Disposition     ED Disposition  Admit   Condition  --   Comment  Hospital Area: [facility] [[id number]]  Level of Care:  Progressive [102]  Admit to Progressive based on following criteria: NEUROLOGICAL AND NEUROSURGICAL complex patients with significant risk of instability, who do not meet ICU criteria, yet require close observation or frequent assessment (< / = every 2 - 4 hours) with medical / nursing intervention.  Admit to Progressive based on following criteria: MULTISYSTEM THREATS such as stable sepsis, metabolic/electrolyte imbalance with or without encephalopathy that is responding to early treatment.  May admit patient to [facility] or [facility] if equivalent level  of care is available:: No  Covid Evaluation: Asymptomatic - no recent exposure (last 10 days) testing not required  Diagnosis: Subdural hematoma [facility]) [[id number]]  Admitting Physician: Aviance Cooperwood [[id number]]  Attending Physician: Sharea Guinther [[id number]]  Certification:: I certify this patient will need inpatient services for at least 2 midnights  Expected Medical Readiness: 07/24/2024            inpatient     I Expect 2 midnight stay secondary to severity of patient's current illness need for inpatient interventions justified by the following:  hemodynamic instability despite optimal treatment (tachycardia  hypoxia, )   Severe lab/radiological/exam abnormalities including:   Aspiration pneumonia subdural hematoma and extensive comorbidities including:  DM2      CKD    history of stroke with residual deficits   That are currently affecting medical management.   I expect  patient to be hospitalized for 2 midnights requiring inpatient medical care.  Patient is at high risk for adverse outcome (such as loss of life or disability) if not treated.  Indication for inpatient stay as follows:  Severe change from baseline regarding mental status Hemodynamic instability despite maximal medical therapy,  severe pain requiring acute inpatient management,  inability to maintain oral hydration     New or worsening hypoxia    Need  for IV antibiotics, IV fluids,,     Level of care         progressive      tele indefinitely please discontinue once patient no longer qualifies COVID-19 Labs     Cerys Winget 07/22/2024, 12:15 AM    [facility]     after 2 AM please page floor coverage   If 7AM-7PM, please contact the day team taking care of the patient using [email or web address]

## 2024-07-21 NOTE — ED Notes (Signed)
 Unsuccessful attempt to insert coude foley cath, edp notified.

## 2024-07-21 NOTE — Progress Notes (Signed)
 Orthopedic Tech Progress Note Patient Details:  Andre Stout November 07, 1940 [id number]  Ortho Devices Type of Ortho Device: Thoracolumbar corset (TLSO) Ortho Device/Splint Interventions: Ordered, Application, Adjustment   Post Interventions Patient Tolerated: Well  [name] 07/21/2024, 10:40 PM

## 2024-07-21 NOTE — ED Provider Notes (Signed)
 [place] EMERGENCY DEPARTMENT AT [facility] Provider Note   CSN: [id number] Arrival date & time: 07/21/24  1707     Patient presents with: Hyperglycemia   Andre Stout is a 84 y.o. male with a past medical history of poorly controlled diabetes, history of prostate cancer, hypertension,  Wenckebach, history of DKA type 2 diabetes, history of stroke, hyperlipidemia, hyponatremia, percutaneous cholecystostomy tube, prostate cancer.  He is cared for by his daughter.  He arrives by EMS for altered mental status and weakness.  Patient is unable to give history due to acuity of condition at this time.  EMS reports that the patient's daughter sent him in due to mental status change and progressive weakness.  Normally he walks with a walker but has been too weak to walk over the past day or so.  Today she noticed that he seemed more confused and progressively worsened over the day.  She is also noticed that there is been a change in the color of the output from his percutaneous drain.  Upon arrival patient noted to be tachypneic and febrile with   {Add pertinent medical, surgical, social history, OB history to HPI:32947}  Hyperglycemia      Prior to Admission medications   Medication Sig Start Date End Date Taking? Authorizing Provider  acetaminophen  (TYLENOL ) 500 MG tablet Take 1,000 mg by mouth daily as needed (pain).    [provider]  amLODipine  (NORVASC ) 10 MG tablet Take 1 tablet (10 mg total) by mouth daily. 11/15/23   Jason Leita Repine, FNP  aspirin  81 MG chewable tablet Chew 81 mg by mouth daily. 01/06/22   [provider]  atorvastatin  (LIPITOR) 40 MG tablet TAKE 1 TABLET BY MOUTH EVERY DAY 07/16/24   Murray, Laura Woodruff, FNP  Azelastine  HCl 137 MCG/SPRAY SOLN PLACE 1 SPRAY INTO BOTH NOSTRILS 2 (TWO) TIMES DAILY AS NEEDED. NEEDS APPT 07/13/24   Jason Leita Repine, FNP  Blood Glucose Monitoring Suppl (ACCU-CHEK GUIDE) w/Device KIT Use As Directed  08/24/22   Christobal Guadalajara, MD  clopidogrel  (PLAVIX ) 75 MG tablet TAKE 1 TABLET BY MOUTH EVERY DAY 07/16/24   Jason Leita Repine, FNP  Continuous Blood Gluc Receiver (FREESTYLE LIBRE 3 READER) DEVI 1 each by Does not apply route daily. 01/22/23   Trixie File, MD  Continuous Blood Gluc Sensor (FREESTYLE LIBRE 3 SENSOR) MISC 1 each by Does not apply route every 14 (fourteen) days. 01/22/23   Trixie File, MD  insulin  aspart protamine - aspart (NOVOLOG  70/30 FLEXPEN) (70-30) 100 UNIT/ML FlexPen Inject under skin 30 units before b'fast and 15-20 units before dinner Take the insulin  10-15 min before meals. 05/07/24   Trixie File, MD  Insulin  Pen Needle 32G X 4 MM MISC Use to inject insulin  3 times a day 11/15/23   Trixie File, MD  Insulin  Syringe-Needle U-100 (BD INSULIN  SYRINGE U/F) 31G X 5/16 1 ML MISC USE 2 DAILY 11/21/22   Trixie File, MD  magnesium  oxide (MAG-OX) 400 (240 Mg) MG tablet TAKE 1 TABLET BY MOUTH TWICE A DAY 02/28/24   Jason Leita Repine, FNP  Multiple Vitamins-Minerals (PRESERVISION AREDS 2) CAPS Take 1 capsule by mouth at bedtime.    [provider]  ondansetron  (ZOFRAN ) 4 MG tablet Take 4 mg by mouth every 8 (eight) hours as needed. 12/01/23   [provider]  AISHA PASTOR LANCETS 33G MISC Use to check blood sugar 4 times per day. Dx code: E11.9 03/27/16   Kassie Mallick, MD  AISHA FLING  test strip USE TO MONITOR GLUCOSE LEVELS 4 TIMES PER DAY E11.9 06/10/19   Kassie Mallick, MD  pantoprazole  (PROTONIX ) 40 MG tablet Take 1 tablet (40 mg total) by mouth 2 (two) times daily before a meal. 11/15/23   Jason Leita Repine, FNP    Allergies: Patient has no known allergies.    Review of Systems  Updated Vital Signs BP 107/68 (BP Location: Right Arm)   Pulse 88   Temp (!) 102.3 F (39.1 C) (Rectal)   Resp (!) 25   SpO2 97%   Physical Exam Vitals and nursing note reviewed. Exam conducted with a chaperone present.  Constitutional:       Appearance: He is underweight. He is ill-appearing and toxic-appearing.     Interventions: Nasal cannula in place.  HENT:     Head: Normocephalic and atraumatic.     Mouth/Throat:     Mouth: Mucous membranes are dry.  Eyes:     Extraocular Movements: Extraocular movements intact.     Pupils: Pupils are equal, round, and reactive to light.  Pulmonary:     Effort: Tachypnea present.     Breath sounds: No wheezing or rhonchi.  Abdominal:     Tenderness: There is abdominal tenderness. There is guarding.     Comments: Percutaneous drain noted from the right upper quadrant of the abdomen.  There is opaque thick appearing liquid.  There is a foul smell present.  Skin:    Comments: Cold in the extremities hot to touch in the core, no obvious mottling.  Neurological:     Mental Status: He is lethargic.     GCS: GCS eye subscore is 2. GCS verbal subscore is 2. GCS motor subscore is 5.     (all labs ordered are listed, but only abnormal results are displayed) Labs Reviewed  CBC WITH DIFFERENTIAL/PLATELET - Abnormal; Notable for the following components:      Result Value   WBC 19.8 (*)    Hemoglobin 10.9 (*)    HCT 33.9 (*)    MCV 77.0 (*)    MCH 24.8 (*)    RDW 17.6 (*)    Platelets 464 (*)    Neutro Abs 18.0 (*)    Abs Immature Granulocytes 0.10 (*)    All other components within normal limits  CBG MONITORING, ED - Abnormal; Notable for the following components:   Glucose-Capillary 415 (*)    All other components within normal limits  I-STAT CG4 LACTIC ACID, ED - Abnormal; Notable for the following components:   Lactic Acid, Venous 5.4 (*)    All other components within normal limits  CULTURE, BLOOD (ROUTINE X 2)  CULTURE, BLOOD (ROUTINE X 2)  RESP PANEL BY RT-PCR (RSV, FLU A&B, COVID)  RVPGX2  COMPREHENSIVE METABOLIC PANEL WITH GFR  PROTIME-INR  URINALYSIS, W/ REFLEX TO CULTURE (INFECTION SUSPECTED)  BETA-HYDROXYBUTYRIC ACID  I-STAT CHEM 8, ED  I-STAT VENOUS BLOOD GAS, ED     EKG: EKG Interpretation Date/Time:  Tuesday July 21 2024 17:25:47 EDT Ventricular Rate:  94 PR Interval:    QRS Duration:  89 QT Interval:  413 QTC Calculation: 517 R Axis:   -59  Text Interpretation: unclear rhythm , concern for wenkebach Ventricular premature complex Inferior infarct, old Anterior infarct, old Lateral leads are also involved Prolonged QT interval No significant change since last tracing Confirmed by Dreama Longs ([id number]) on 07/21/2024 6:28:15 PM  Radiology: No results found.  {Document cardiac monitor, telemetry assessment procedure when appropriate:32947} Procedures  Medications Ordered in the ED  ceFEPIme  (MAXIPIME ) 2 g in sodium chloride  0.9 % 100 mL IVPB (has no administration in time range)  metroNIDAZOLE  (FLAGYL ) IVPB 500 mg (has no administration in time range)  vancomycin  (VANCOCIN ) IVPB 1000 mg/200 mL premix (has no administration in time range)  lactated ringers  bolus 1,000 mL (has no administration in time range)    And  lactated ringers  bolus 1,000 mL (has no administration in time range)    And  lactated ringers  bolus 250 mL (has no administration in time range)  acetaminophen  (TYLENOL ) suppository 650 mg (650 mg Rectal Given 07/21/24 1816)  lactated ringers  bolus 2,000 mL (2,000 mLs Intravenous New Bag/Given 07/21/24 1810)      {Click here for ABCD2, HEART and other calculators REFRESH Note before signing:1}                              Medical Decision Making Amount and/or Complexity of Data Reviewed Labs: ordered. Radiology: ordered.  Risk OTC drugs. Prescription drug management.   ***  {Document critical care time when appropriate  Document review of labs and clinical decision tools ie CHADS2VASC2, etc  Document your independent review of radiology images and any outside records  Document your discussion with family members, caretakers and with consultants  Document social determinants of health affecting pt's care   Document your decision making why or why not admission, treatments were needed:32947:::1}   Final diagnoses:  None    ED Discharge Orders     None

## 2024-07-21 NOTE — Assessment & Plan Note (Signed)
-  will monitor on tele avoid QT prolonging medications, rehydrate correct electrolytes ? ?

## 2024-07-21 NOTE — Assessment & Plan Note (Signed)
-  chronic avoid nephrotoxic medications such as NSAIDs, Vanco Zosyn combo,  avoid hypotension, continue to follow renal function

## 2024-07-21 NOTE — Assessment & Plan Note (Signed)
 Followed by cardiology no pacemaker

## 2024-07-21 NOTE — ED Notes (Signed)
Ortho tech called for TLSO brace

## 2024-07-21 NOTE — Assessment & Plan Note (Signed)
 No evidence of DKA at this time Order sliding scale And insulin  Lantus  25 units

## 2024-07-22 ENCOUNTER — Inpatient Hospital Stay (HOSPITAL_COMMUNITY)

## 2024-07-22 DIAGNOSIS — K833 Fistula of bile duct: Secondary | ICD-10-CM | POA: Diagnosis present

## 2024-07-22 DIAGNOSIS — S065XAA Traumatic subdural hemorrhage with loss of consciousness status unknown, initial encounter: Secondary | ICD-10-CM | POA: Diagnosis not present

## 2024-07-22 DIAGNOSIS — A419 Sepsis, unspecified organism: Secondary | ICD-10-CM | POA: Diagnosis not present

## 2024-07-22 DIAGNOSIS — R6520 Severe sepsis without septic shock: Secondary | ICD-10-CM | POA: Diagnosis not present

## 2024-07-22 DIAGNOSIS — Z789 Other specified health status: Secondary | ICD-10-CM

## 2024-07-22 DIAGNOSIS — Z7189 Other specified counseling: Secondary | ICD-10-CM

## 2024-07-22 DIAGNOSIS — S32019A Unspecified fracture of first lumbar vertebra, initial encounter for closed fracture: Secondary | ICD-10-CM | POA: Diagnosis present

## 2024-07-22 DIAGNOSIS — R5383 Other fatigue: Secondary | ICD-10-CM

## 2024-07-22 DIAGNOSIS — G9340 Encephalopathy, unspecified: Secondary | ICD-10-CM | POA: Diagnosis not present

## 2024-07-22 DIAGNOSIS — Z515 Encounter for palliative care: Secondary | ICD-10-CM

## 2024-07-22 LAB — PHOSPHORUS: Phosphorus: 2.1 mg/dL — ABNORMAL LOW (ref 2.5–4.6)

## 2024-07-22 LAB — COMPREHENSIVE METABOLIC PANEL WITH GFR
ALT: 7 U/L (ref 0–44)
AST: 24 U/L (ref 15–41)
Albumin: 1.8 g/dL — ABNORMAL LOW (ref 3.5–5.0)
Alkaline Phosphatase: 69 U/L (ref 38–126)
Anion gap: 11 (ref 5–15)
BUN: 18 mg/dL (ref 8–23)
CO2: 21 mmol/L — ABNORMAL LOW (ref 22–32)
Calcium: 8.7 mg/dL — ABNORMAL LOW (ref 8.9–10.3)
Chloride: 102 mmol/L (ref 98–111)
Creatinine, Ser: 1.26 mg/dL — ABNORMAL HIGH (ref 0.61–1.24)
GFR, Estimated: 57 mL/min — ABNORMAL LOW (ref >60)
Glucose, Bld: 94 mg/dL (ref 70–99)
Potassium: 4.1 mmol/L (ref 3.5–5.1)
Sodium: 134 mmol/L — ABNORMAL LOW (ref 135–145)
Total Bilirubin: 1.4 mg/dL — ABNORMAL HIGH (ref 0.0–1.2)
Total Protein: 5.4 g/dL — ABNORMAL LOW (ref 6.5–8.1)

## 2024-07-22 LAB — CBG MONITORING, ED
Glucose-Capillary: 157 mg/dL — ABNORMAL HIGH (ref 70–99)
Glucose-Capillary: 61 mg/dL — ABNORMAL LOW (ref 70–99)
Glucose-Capillary: 135 mg/dL — ABNORMAL HIGH (ref 70–99)
Glucose-Capillary: 141 mg/dL — ABNORMAL HIGH (ref 70–99)
Glucose-Capillary: 100 mg/dL — ABNORMAL HIGH (ref 70–99)
Glucose-Capillary: 193 mg/dL — ABNORMAL HIGH (ref 70–99)

## 2024-07-22 LAB — GLUCOSE, CAPILLARY
Glucose-Capillary: 152 mg/dL — ABNORMAL HIGH (ref 70–99)
Glucose-Capillary: 157 mg/dL — ABNORMAL HIGH (ref 70–99)
Glucose-Capillary: 176 mg/dL — ABNORMAL HIGH (ref 70–99)

## 2024-07-22 LAB — URINALYSIS, W/ REFLEX TO CULTURE (INFECTION SUSPECTED)
Bilirubin Urine: NEGATIVE
Glucose, UA: >=500 mg/dL — AB
Ketones, ur: 5 mg/dL — AB
Nitrite: NEGATIVE
Protein, ur: 30 mg/dL — AB
Specific Gravity, Urine: 1.016 (ref 1.005–1.030)
WBC, UA: >50 WBC/hpf (ref 0–5)
pH: 5 (ref 5.0–8.0)

## 2024-07-22 LAB — CBC
HCT: 29 % — ABNORMAL LOW (ref 39.0–52.0)
HCT: 29.1 % — ABNORMAL LOW (ref 39.0–52.0)
Hemoglobin: 9.1 g/dL — ABNORMAL LOW (ref 13.0–17.0)
Hemoglobin: 9.2 g/dL — ABNORMAL LOW (ref 13.0–17.0)
MCH: 24.7 pg — ABNORMAL LOW (ref 26.0–34.0)
MCH: 24.7 pg — ABNORMAL LOW (ref 26.0–34.0)
MCHC: 31.4 g/dL (ref 30.0–36.0)
MCHC: 31.6 g/dL (ref 30.0–36.0)
MCV: 78.8 fL — ABNORMAL LOW (ref 80.0–100.0)
MCV: 78 fL — ABNORMAL LOW (ref 80.0–100.0)
Platelets: 355 K/uL (ref 150–400)
Platelets: 362 K/uL (ref 150–400)
RBC: 3.68 MIL/uL — ABNORMAL LOW (ref 4.22–5.81)
RBC: 3.73 MIL/uL — ABNORMAL LOW (ref 4.22–5.81)
RDW: 17.8 % — ABNORMAL HIGH (ref 11.5–15.5)
RDW: 17.9 % — ABNORMAL HIGH (ref 11.5–15.5)
WBC: 18.5 K/uL — ABNORMAL HIGH (ref 4.0–10.5)
WBC: 16.9 K/uL — ABNORMAL HIGH (ref 4.0–10.5)
nRBC: 0 % (ref 0.0–0.2)
nRBC: 0 % (ref 0.0–0.2)

## 2024-07-22 LAB — BLOOD CULTURE ID PANEL (REFLEXED) - BCID2

## 2024-07-22 LAB — MAGNESIUM: Magnesium: 1.5 mg/dL — ABNORMAL LOW (ref 1.7–2.4)

## 2024-07-22 LAB — PREALBUMIN: Prealbumin: <5 mg/dL — ABNORMAL LOW (ref 18–38)

## 2024-07-22 LAB — SODIUM, URINE, RANDOM: Sodium, Ur: 24 mmol/L

## 2024-07-22 LAB — MRSA NEXT GEN BY PCR, NASAL: MRSA by PCR Next Gen: NOT DETECTED

## 2024-07-22 LAB — OSMOLALITY: Osmolality: 286 mosm/kg (ref 275–295)

## 2024-07-22 LAB — PROCALCITONIN: Procalcitonin: 7.76 ng/mL

## 2024-07-22 LAB — LACTIC ACID, PLASMA
Lactic Acid, Venous: 1.6 mmol/L (ref 0.5–1.9)
Lactic Acid, Venous: 1.8 mmol/L (ref 0.5–1.9)

## 2024-07-22 LAB — FIBRINOGEN: Fibrinogen: 506 mg/dL — ABNORMAL HIGH (ref 210–475)

## 2024-07-22 LAB — CREATININE, URINE, RANDOM: Creatinine, Urine: 93 mg/dL

## 2024-07-22 LAB — OSMOLALITY, URINE: Osmolality, Ur: 467 mosm/kg (ref 300–900)

## 2024-07-22 LAB — I-STAT CG4 LACTIC ACID, ED: Lactic Acid, Venous: 2.5 mmol/L (ref 0.5–1.9)

## 2024-07-22 LAB — APTT: aPTT: 35 s (ref 24–36)

## 2024-07-22 LAB — STREP PNEUMONIAE URINARY ANTIGEN: Strep Pneumo Urinary Antigen: NEGATIVE

## 2024-07-22 LAB — TSH: TSH: 2.084 u[IU]/mL (ref 0.350–4.500)

## 2024-07-22 LAB — CK: Total CK: 259 U/L (ref 49–397)

## 2024-07-22 MED ORDER — ACETAMINOPHEN 650 MG RE SUPP
650.0000 mg | Freq: Four times a day (QID) | RECTAL | Status: DC | PRN
Start: 2024-07-22 — End: 2024-07-30

## 2024-07-22 MED ORDER — MAGNESIUM SULFATE 2 GM/50ML IV SOLN
2.0000 g | Freq: Once | INTRAVENOUS | Status: AC
  Administered 2024-07-22: 2 g via INTRAVENOUS
  Filled 2024-07-22: qty 50

## 2024-07-22 MED ORDER — ATORVASTATIN CALCIUM 40 MG PO TABS
40.0000 mg | ORAL_TABLET | Freq: Every day | ORAL | Status: DC
  Administered 2024-07-23 – 2024-07-30 (×8): 40 mg via ORAL
  Filled 2024-07-22 (×8): qty 1

## 2024-07-22 MED ORDER — INSULIN GLARGINE-YFGN 100 UNIT/ML ~~LOC~~ SOLN
10.0000 [IU] | Freq: Every day | SUBCUTANEOUS | Status: DC
  Administered 2024-07-22: 10 [IU] via SUBCUTANEOUS
  Filled 2024-07-22 (×2): qty 0.1

## 2024-07-22 MED ORDER — LEVETIRACETAM 500 MG PO TABS
500.0000 mg | ORAL_TABLET | Freq: Two times a day (BID) | ORAL | Status: DC
  Administered 2024-07-22 – 2024-07-28 (×10): 500 mg via ORAL
  Filled 2024-07-22 (×11): qty 1

## 2024-07-22 MED ORDER — LACTATED RINGERS IV SOLN: INTRAVENOUS | Status: AC

## 2024-07-22 MED ORDER — K PHOS MONO-SOD PHOS DI & MONO 155-852-130 MG PO TABS
500.0000 mg | ORAL_TABLET | Freq: Three times a day (TID) | ORAL | Status: AC
  Administered 2024-07-22 (×2): 500 mg via ORAL
  Filled 2024-07-22 (×4): qty 2

## 2024-07-22 MED ORDER — ALBUTEROL SULFATE (2.5 MG/3ML) 0.083% IN NEBU: 2.5000 mg | INHALATION_SOLUTION | RESPIRATORY_TRACT | Status: DC | PRN

## 2024-07-22 MED ORDER — VANCOMYCIN VARIABLE DOSE PER UNSTABLE RENAL FUNCTION (PHARMACIST DOSING): Status: DC

## 2024-07-22 MED ORDER — ACETAMINOPHEN 325 MG PO TABS: 650.0000 mg | ORAL_TABLET | Freq: Four times a day (QID) | ORAL | Status: DC | PRN

## 2024-07-22 MED ORDER — LEVETIRACETAM (KEPPRA) 500 MG/5 ML ADULT IV PUSH
500.0000 mg | Freq: Two times a day (BID) | INTRAVENOUS | Status: DC
  Administered 2024-07-23 – 2024-07-24 (×2): 500 mg via INTRAVENOUS
  Filled 2024-07-22 (×4): qty 5

## 2024-07-22 MED ORDER — LACTATED RINGERS IV BOLUS
1000.0000 mL | Freq: Once | INTRAVENOUS | Status: AC
  Administered 2024-07-22: 1000 mL via INTRAVENOUS

## 2024-07-22 MED ORDER — PANTOPRAZOLE SODIUM 40 MG IV SOLR
40.0000 mg | Freq: Every day | INTRAVENOUS | Status: DC
  Administered 2024-07-22 (×2): 40 mg via INTRAVENOUS
  Filled 2024-07-22 (×2): qty 10

## 2024-07-22 MED ORDER — FENTANYL CITRATE PF 50 MCG/ML IJ SOSY: 12.5000 ug | PREFILLED_SYRINGE | INTRAMUSCULAR | Status: DC | PRN

## 2024-07-22 MED ORDER — ALBUMIN HUMAN 25 % IV SOLN
25.0000 g | INTRAVENOUS | Status: AC
  Administered 2024-07-22: 12.5 g via INTRAVENOUS
  Filled 2024-07-22: qty 100

## 2024-07-22 MED ORDER — SODIUM CHLORIDE 0.9 % IV SOLN: INTRAVENOUS | Status: DC

## 2024-07-22 MED ORDER — DEXTROSE 50 % IV SOLN
50.0000 mL | INTRAVENOUS | Status: DC | PRN
  Administered 2024-07-22: 50 mL via INTRAVENOUS
  Filled 2024-07-22: qty 50

## 2024-07-22 MED ORDER — HYDROCODONE-ACETAMINOPHEN 5-325 MG PO TABS: 1.0000 | ORAL_TABLET | ORAL | Status: DC | PRN

## 2024-07-22 NOTE — Inpatient Diabetes Management (Signed)
 Inpatient Diabetes Program Recommendations  AACE/ADA: New Consensus Statement on Inpatient Glycemic Control (2015)  Target Ranges:  Prepandial:   less than 140 mg/dL      Peak postprandial:   less than 180 mg/dL (1-2 hours)      Critically ill patients:  140 - 180 mg/dL   Lab Results  Component Value Date   GLUCAP 100 (H) 07/22/2024   HGBA1C 10.7 (H) 07/21/2024    Review of Glycemic Control  Latest Reference Range & Units 07/21/24 17:25 07/21/24 23:14 07/22/24 04:30 07/22/24 05:11 07/22/24 06:28 07/22/24 07:58 07/22/24 11:32  Glucose-Capillary 70 - 99 mg/dL 584 (H) 867 (H) 61 (L) 157 (H) 135 (H) 141 (H) 100 (H)  (H): Data is abnormally high (L): Data is abnormally low  Diabetes history: DM2 Outpatient Diabetes medications: 70/30 30 units QAM, 15-20 units QPM with dinner, Freestyle Libre 3 Current orders for Inpatient glycemic control: Semglee  10 units every day, Novolog  0-9 units Q4H  Inpatient Diabetes Program Recommendations:    Might consider holding Semglee  10 units at bedtime until glucose trends > 180 mg/dL.    Current with Endocrinology-Dr. Lela Fendt.  Last visit was in May 2025.    Thank you, Wyvonna Pinal, MSN, CDCES Diabetes Coordinator Inpatient Diabetes Program [phone] (team pager from 8a-5p)

## 2024-07-22 NOTE — Consult Note (Addendum)
   I have reviewed the history and physical, and personally reviewed the imaging/lab findings and agree with the impression and plan as documented in the consult note with the following addition(s):  84 y.o. male presenting with AMS and significant medical hx including previous CVA on plavix . CTH personally reviewed and demonstrates stable small left parietal convexity SDH with minimal local mass effect, no MLS. This can be managed non-operatively. He also has an L1 compression fracture which can also be managed conservatively. Can hold plavix , plan on outpatient f/u of SDH. TLSO for comfort for L1 fracture.  Gerldine Maizes, MD [facility]        Providing Compassionate, Quality Care - Together  Neurosurgery Consult  Referring physician: EDP Reason for referral: SDH  History of Present Illness: Pt presented to ED w/ AMS. PMH of DM, CVA on plavix . Workup in ED revealing PNA with sepsis. NSGY consulted for L SDH as well as L1 compression fx.   Physical Exam:  Vital signs in last 24 hours: Temp:  [98 F (36.7 C)-98.3 F (36.8 C)] 98 F (36.7 C) (07/25 1814) Pulse Rate:  [58-128] 65 (07/26 0746) Resp:  [11-18] 14 (07/26 0217) BP: (138-182)/(65-125) 153/88 (07/26 0700) SpO2:  [91 %-98 %] 96 % (07/26 0746)  PE: Lethargic, will incoherently mumble MAEs PERRL  Impression/Assessment:  This is a 84yo male with L sided SDH w/o significant MLS, stable on repeat CTH. Also w/ L1 compression fx w/o posterior element involvement.   Plan:  -Continue to hold plavix  7-10d -Goal SBP <160 -Prophylactic keppra  500mg  BID -TLSO when OOB.   SARA CAYLIN TOMLINSON, PA-C

## 2024-07-22 NOTE — Assessment & Plan Note (Signed)
 Did not had a pacemaker placed, asymptomatic  Followed by cardiology

## 2024-07-22 NOTE — ED Notes (Signed)
Condom catheter applied to pt 

## 2024-07-22 NOTE — Consult Note (Signed)
 Consultation Note Date: 07/22/2024   Patient Name: Andre Stout  DOB: 1940/11/29  MRN: [id number]  Age / Sex: 84 y.o., male  PCP: Jason Leita Repine, FNP Referring Physician: Jens Durand, MD  Reason for Consultation: Establishing goals of care  HPI/Patient Profile: 84 y.o. 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   Of note, patient has had 2 admissions in the last 6 months.  Clinical Assessment and Goals of Care: I have reviewed medical records including EPIC notes, labs, any available advanced directives, and imaging. Received report from primary RN - no acute concerns.   Went to visit patient at bedside - no family/visitors present. Patient was lying in bed - he does not open  his eyes to gentle touch or voice - noted he is very HOH - per RN he does not have hearing aids. No signs or non-verbal gestures of pain or discomfort noted. No respiratory distress, increased work of breathing, or secretions noted.   1:11 PM Called daughter/Andre Stout  to discuss diagnosis, prognosis, GOC, EOL wishes, disposition, and options.  I introduced Palliative Medicine as specialized medical care for people living with serious illness. It focuses on providing relief from the symptoms and stress of a serious illness. The goal is to improve quality of life for both the patient and the family.  We discussed a brief life review of the patient as well as functional and nutritional status. Patient's wife is deceased - he only has one daughter/Andre Stout. Prior to hospitalization, patient was living with  Andre Stout - she was his primary caregiver. At home, he was able to ambulate with a walker + assistance. Andre Stout tells me his appetite is overall poor. She has noted a steady decline in his health over the last 4 years.  We discussed patient's current illness and what it means in the larger context of patient's on-going co-morbidities. She has a clear understanding of his current acute medical situation. Repeat CT scan results reviewed per her request. Natural disease trajectory and expectations at EOL were discussed. I attempted to elicit values and goals of care important to the patient. The difference between aggressive medical intervention and comfort care was considered in light of the patient's goals of care.   Advance directives, concepts specific to code status, artificial feeding and hydration, and rehospitalization were considered and discussed.  At this time, Andre Stout would like to continue current medical treatment with watchful waiting. She will make stepwise decisions pending clinical course.  Encouraged her to consider DNR/DNI status for patient understanding evidenced based poor outcomes in similar hospitalized patient, as the cause of arrest is likely associated with advanced chronic/terminal illness rather than an easily reversible acute cardio-pulmonary event.  I shared that even if we pursued resuscitation we would not able to resolve the underlying factors. I explained that DNR/DNI does not change the medical plan and it only comes into effect after a person has arrested (died).  It is a protective measure to keep us  from harming the patient in their last moments of life. Family was not agreeable to DNR/DNI with understanding that he would receive CPR, defibrillation, ACLS medications, and/or intubation.   Discussed with patient/family the importance of continued conversation with each other and the medical  providers regarding overall plan of care and treatment options, ensuring  decisions are within the context of the patient's values and GOCs.    Questions and concerns were addressed. The patient/family was encouraged to call with questions and/or concerns. PMT number was provided.   Primary Decision Maker: NEXT OF KIN - daughter/Andre Stout    SUMMARY OF RECOMMENDATIONS   Continue full scope care with watchful waiting Continue full code Daughter will make stepwise decisions pending clinical course PMT will continue to follow and support holistically   Code Status/Advance Care Planning: Full code  Palliative Prophylaxis:  Aspiration, Delirium Protocol, Frequent Pain Assessment, and Oral Care  Additional Recommendations (Limitations, Scope, Preferences): Full Scope Treatment  Psycho-social/Spiritual:  Desire for further Chaplaincy support:no Created space and opportunity for patient and family to express thoughts and feelings regarding patient's current medical situation.  Emotional support and therapeutic listening provided.  Prognosis:  Unable to determine  Discharge Planning: To Be Determined      Primary Diagnoses: Present on Admission:  Prolonged QT interval  Acute encephalopathy  Benign essential HTN  Hyperlipidemia  Prostate cancer (HCC)  Severe sepsis (HCC)  Aspiration pneumonia (HCC)  Acute respiratory failure with hypoxia (HCC)  Mobitz type 1 second degree atrioventricular block  CKD (chronic kidney disease) stage 3, GFR 30-59 ml/min (HCC)  Subdural hematoma (HCC)  L1 vertebral fracture (HCC)  Biliary fistula  Heart block AV complete (HCC)   I have reviewed the medical record, interviewed the patient and family, and examined the patient. The following aspects are pertinent.  Past Medical History:  Diagnosis Date   Diabetes mellitus without complication (HCC)    Glaucoma    Heart block    complete heart block   Hypertension    Prostate cancer (HCC)    Been 3-4 years ago   Social History   Socioeconomic  History   Marital status: Widowed    Spouse name: Not on file   Number of children: 1   Years of education: 62   Highest education level: Not on file  Occupational History   Occupation: Retired  Tobacco Use   Smoking status: Former    Current packs/day: 0.00    Average packs/day: 0.5 packs/day for 4.0 years (2.0 ttl pk-yrs)    Types: Cigarettes    Start date: 10/29/1970    Quit date: 10/29/1974    Years since quitting: 49.7   Smokeless tobacco: Never  Vaping Use   Vaping status: Never Used  Substance and Sexual Activity   Alcohol use: No   Drug use: No   Sexual activity: Not Currently  Other Topics Concern   Not on file  Social History Narrative   Born and raised in [place], [place]. Currently resides with his daughter (temporary), but he still has a residence.  No live. Fun: hunt and fish   Denies religious beliefs that would effect health care.    Social Drivers of Corporate investment banker Strain: Low Risk  (07/24/2023)   Overall Financial Resource Strain (CARDIA)    Difficulty of Paying Living Expenses: Not hard at all  Food Insecurity: Patient Unable To Answer (01/19/2024)   Hunger Vital Sign    Worried About Running Out of Food in the Last Year: Patient unable to answer    Ran Out of Food in the Last Year: Patient unable to answer  Transportation Needs: Patient Unable To Answer (01/19/2024)   PRAPARE - Transportation    Lack of Transportation (Medical): Patient unable to answer  Lack of Transportation (Non-Medical): Patient unable to answer  Physical Activity: Inactive (07/24/2023)   Exercise Vital Sign    Days of Exercise per Week: 0 days    Minutes of Exercise per Session: 0 min  Stress: No Stress Concern Present (07/24/2023)   [employer] of Occupational Health - Occupational Stress Questionnaire    Feeling of Stress : Not at all  Social Connections: Patient Unable To Answer (01/19/2024)   Social Connection and Isolation Panel    Frequency of Communication  with Friends and Family: Patient unable to answer    Frequency of Social Gatherings with Friends and Family: Patient unable to answer    Attends Religious Services: Patient unable to answer    Active Member of Clubs or Organizations: Patient unable to answer    Attends Banker Meetings: Patient unable to answer    Marital Status: Patient unable to answer   Family History  Problem Relation Age of Onset   Other Mother        unknown medical history   Diabetes Father    Diabetes Paternal Grandfather    Scheduled Meds:  atorvastatin   40 mg Oral Daily   insulin  aspart  0-9 Units Subcutaneous Q4H   insulin  glargine-yfgn  10 Units Subcutaneous QHS   pantoprazole  (PROTONIX ) IV  40 mg Intravenous QHS   phosphorus  500 mg Oral TID   Continuous Infusions:  ampicillin -sulbactam (UNASYN ) IV Stopped (07/22/24 1026)   lactated ringers  125 mL/hr at 07/22/24 1245   magnesium  sulfate bolus IVPB     PRN Meds:.acetaminophen  **OR** acetaminophen , albuterol , dextrose , fentaNYL  (SUBLIMAZE ) injection, HYDROcodone -acetaminophen  Medications Prior to Admission:  Prior to Admission medications   Medication Sig Start Date End Date Taking? Authorizing Provider  acetaminophen  (TYLENOL ) 500 MG tablet Take 1,000 mg by mouth daily as needed (pain).   Yes [provider]  amLODipine  (NORVASC ) 10 MG tablet Take 1 tablet (10 mg total) by mouth daily. 11/15/23  Yes Jason Leita Repine, FNP  aspirin  81 MG chewable tablet Chew 81 mg by mouth daily. 01/06/22  Yes [provider]  atorvastatin  (LIPITOR) 40 MG tablet TAKE 1 TABLET BY MOUTH EVERY DAY 07/16/24  Yes Jason Leita Repine, FNP  Azelastine  HCl 137 MCG/SPRAY SOLN PLACE 1 SPRAY INTO BOTH NOSTRILS 2 (TWO) TIMES DAILY AS NEEDED. NEEDS APPT 07/13/24  Yes Jason Leita Repine, FNP  clopidogrel  (PLAVIX ) 75 MG tablet TAKE 1 TABLET BY MOUTH EVERY DAY 07/16/24  Yes Jason Leita Repine, FNP  insulin  aspart protamine - aspart (NOVOLOG   70/30 FLEXPEN) (70-30) 100 UNIT/ML FlexPen Inject under skin 30 units before b'fast and 15-20 units before dinner Take the insulin  10-15 min before meals. 05/07/24  Yes Trixie File, MD  magnesium  oxide (MAG-OX) 400 (240 Mg) MG tablet TAKE 1 TABLET BY MOUTH TWICE A DAY Patient taking differently: Take 1 tablet by mouth every evening. 02/28/24  Yes Jason Leita Repine, FNP  Multiple Vitamins-Minerals (PRESERVISION AREDS 2) CAPS Take 1 capsule by mouth at bedtime.   Yes [provider]  pantoprazole  (PROTONIX ) 40 MG tablet Take 1 tablet (40 mg total) by mouth 2 (two) times daily before a meal. 11/15/23  Yes Jason Leita Repine, FNP  Blood Glucose Monitoring Suppl (ACCU-CHEK GUIDE) w/Device KIT Use As Directed 08/24/22   Christobal Guadalajara, MD  Continuous Blood Gluc Receiver (FREESTYLE LIBRE 3 READER) DEVI 1 each by Does not apply route daily. 01/22/23   Trixie File, MD  Continuous Blood Gluc Sensor (FREESTYLE LIBRE 3 SENSOR) MISC 1 each  by Does not apply route every 14 (fourteen) days. 01/22/23   Trixie File, MD  Insulin  Pen Needle 32G X 4 MM MISC Use to inject insulin  3 times a day 11/15/23   Trixie File, MD  Insulin  Syringe-Needle U-100 (BD INSULIN  SYRINGE U/F) 31G X 5/16 1 ML MISC USE 2 DAILY 11/21/22   Trixie File, MD  [facility] DELICA LANCETS 33G MISC Use to check blood sugar 4 times per day. Dx code: E11.9 03/27/16   Kassie Mallick, MD  [facility] ULTRA test strip USE TO MONITOR GLUCOSE LEVELS 4 TIMES PER DAY E11.9 06/10/19   Kassie Mallick, MD   No Known Allergies Review of Systems  Unable to perform ROS: Acuity of condition    Physical Exam Vitals and nursing note reviewed.  Constitutional:      General: He is not in acute distress.    Appearance: He is ill-appearing.  Pulmonary:     Effort: No respiratory distress.  Skin:    General: Skin is warm and dry.  Neurological:     Mental Status: He is lethargic.     Vital Signs: BP (!) 108/54   Pulse 74    Temp 97.7 F (36.5 C) (Axillary)   Resp (!) 24   Ht 5' (1.524 m)   Wt 72.6 kg Comment: from May 2025 records  SpO2 93%   BMI 31.25 kg/m  Pain Scale: Not given for pain       SpO2: SpO2: 93 % O2 Device:SpO2: 93 % O2 Flow Rate: .O2 Flow Rate (L/min): 3 L/min  IO: Intake/output summary:  Intake/Output Summary (Last 24 hours) at 07/22/2024 1258 Last data filed at 07/22/2024 9370 Gross per 24 hour  Intake 1150 ml  Output --  Net 1150 ml    LBM:   Baseline Weight: Weight: 72.6 kg (from May 2025 records) Most recent weight: Weight: 72.6 kg (from May 2025 records)     Palliative Assessment/Data: PPS 10%     Time In: 1250 Time Out: 1405 Time Total: 75 minutes  Signed by: Jeoffrey CHRISTELLA Sharps, NP   Please contact Palliative Medicine Team phone at [phone] for questions and concerns.  For individual provider: See Amion  *Portions of this note are a verbal dictation therefore any spelling and/or grammatical errors are due to the Dragon Medical One system interpretation.

## 2024-07-22 NOTE — ED Notes (Signed)
 Patient's CBG noted to be 61 and patient is unable to tolerate PO fluids at this time. Roxanna, MD contacted - new orders placed.

## 2024-07-22 NOTE — ED Notes (Signed)
 Charge notified of patient coming to floor

## 2024-07-22 NOTE — Evaluation (Signed)
 Clinical/Bedside Swallow Evaluation Patient Details  Name: Andre Stout MRN: [id number] Date of Birth: [birth year]  Today's Date: 07/22/2024 Time: SLP Start Time (ACUTE ONLY): 9141 SLP Stop Time (ACUTE ONLY): 0915 SLP Time Calculation (min) (ACUTE ONLY): 17 min  Past Medical History:  Past Medical History:  Diagnosis Date   Diabetes mellitus without complication (HCC)    Glaucoma    Heart block    complete heart block   Hypertension    Prostate cancer (HCC)    Been 3-4 years ago   Past Surgical History:  Past Surgical History:  Procedure Laterality Date   CATARACT EXTRACTION Bilateral    INSERTION PROSTATE RADIATION SEED     IR EXCHANGE BILIARY DRAIN  08/19/2023   IR EXCHANGE BILIARY DRAIN  12/19/2023   IR EXCHANGE BILIARY DRAIN  01/20/2024   IR PERC CHOLECYSTOSTOMY  08/17/2022   IR RADIOLOGIST EVAL & MGMT  09/28/2022   IR RADIOLOGIST EVAL & MGMT  10/10/2022   HPI:  Andre Stout is a 84 y.o. male Family states patient had a fall about a week ago few days ago started having more nausea and vomiting had increased depth and rate of respirations. CT head shows Acute left posterior convexity subdural hematoma, measuring up to 8 mm,  without midline shift. CT chest shows Multifocal patchy right lung opacities, suspicious for pneumonia possibly on  the basis of aspiration. 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 Sp percutaneous biliary drain    Assessment / Plan / Recommendation  Clinical Impression  Pt presents with altered mentation, poor positioning, adequate ability to sip liquids and eat puree if alert and upright. Laying flat pt had some belching and gagging. SLP and MD sat pt up and pt woke up and responded to loud voice and speaker in visual field. No focal facial weakness, but pt unable to follow oral motor commands. Seems  very hard of hearing. Again tolerated PO while attentive and upright. Pt was continually trying to scoot down from sitting and after laying down more he did have some hard coughing. Suspect pts greatest risk of aspiration is post prandial given poor positioning, signs of GER and recent history of vomiting. Recommend pt initiate a puree/thin liquid diet with full supervision when alert and upright. SLP Visit Diagnosis: Dysphagia, unspecified (R13.10)    Aspiration Risk  Mild aspiration risk    Diet Recommendation Dysphagia 1 (Puree);Thin liquid    Liquid Administration via: Cup;Straw Medication Administration: Crushed with puree Supervision: Full supervision/cueing for compensatory strategies Compensations: Slow rate;Small sips/bites Postural Changes: Seated upright at 90 degrees;Remain upright for at least 30 minutes after po intake    Other  Recommendations Oral Care Recommendations: Oral care QID     Assistance Recommended at Discharge    Functional Status Assessment Patient has had a recent decline in their functional status and demonstrates the ability to make significant improvements in function in a reasonable and predictable amount of time.  Frequency and Duration min 2x/week  2 weeks       Prognosis Prognosis for improved oropharyngeal function: Good      Swallow Study   General HPI: Andre Stout is a 84 y.o. male Family states patient had a fall about a week ago few days ago started having more nausea and vomiting had increased depth and rate of respirations. CT head shows Acute left posterior convexity subdural hematoma,  measuring up to 8 mm,  without midline shift. CT chest shows Multifocal patchy right lung opacities, suspicious for pneumonia possibly on  the basis of aspiration. 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  Sp percutaneous biliary drain Type of Study: Bedside Swallow Evaluation Previous Swallow Assessment: none Diet Prior to this Study: NPO Temperature Spikes Noted: No Respiratory Status: Nasal cannula History of Recent Intubation: No Behavior/Cognition: Requires cueing Oral Cavity Assessment: Within Functional Limits Oral Care Completed by SLP: No Oral Cavity - Dentition: Edentulous;Other (Comment) (Denture found in bed, placed in labeled bag. did not replace give risk of loss) Patient Positioning: Partially reclined Baseline Vocal Quality: Normal    Oral/Motor/Sensory Function Overall Oral Motor/Sensory Function: Within functional limits   Ice Chips     Thin Liquid Thin Liquid: Within functional limits Presentation: Straw    Nectar Thick Nectar Thick Liquid: Not tested   Honey Thick Honey Thick Liquid: Not tested   Puree Puree: Within functional limits Presentation: Spoon   Solid     Solid: Not tested      Andre Stout 07/22/2024,10:21 AM

## 2024-07-22 NOTE — Progress Notes (Signed)
 PHARMACY - PHYSICIAN COMMUNICATION CRITICAL VALUE ALERT - BLOOD CULTURE IDENTIFICATION (BCID)  Andre Stout is an 84 y.o. male who presented to [facility] on 07/21/2024 with a chief complaint of cholecystitis s/p percutaneous biliary drain   Assessment:  WBC elevated 17 Tm 102 currently afebrile, BCID with staph species - no resistance - possible contaminant  Name of physician (or Provider) Contacted: Dr Charlton  Current antibiotics: Unasyn  3gm IV q6h  Changes to prescribed antibiotics recommended:  Continue Unasyn  3gm IV q6h   Olam Chalk Pharm.D. CPP, BCPS Clinical Pharmacist [phone] 07/22/2024 8:56 PM    Results for orders placed or performed during the hospital encounter of 07/21/24  Blood Culture ID Panel (Reflexed) (Collected: 07/21/2024  6:10 PM)  Result Value Ref Range   Enterococcus faecalis NOT DETECTED NOT DETECTED   Enterococcus Faecium NOT DETECTED NOT DETECTED   Listeria monocytogenes NOT DETECTED NOT DETECTED   Staphylococcus species DETECTED (A) NOT DETECTED   Staphylococcus aureus (BCID) NOT DETECTED NOT DETECTED   Staphylococcus epidermidis NOT DETECTED NOT DETECTED   Staphylococcus lugdunensis NOT DETECTED NOT DETECTED   Streptococcus species NOT DETECTED NOT DETECTED   Streptococcus agalactiae NOT DETECTED NOT DETECTED   Streptococcus pneumoniae NOT DETECTED NOT DETECTED   Streptococcus pyogenes NOT DETECTED NOT DETECTED   A.calcoaceticus-baumannii NOT DETECTED NOT DETECTED   Bacteroides fragilis NOT DETECTED NOT DETECTED   Enterobacterales NOT DETECTED NOT DETECTED   Enterobacter cloacae complex NOT DETECTED NOT DETECTED   Escherichia coli NOT DETECTED NOT DETECTED   Klebsiella aerogenes NOT DETECTED NOT DETECTED   Klebsiella oxytoca NOT DETECTED NOT DETECTED   Klebsiella pneumoniae NOT DETECTED NOT DETECTED   Proteus species NOT DETECTED NOT DETECTED   Salmonella species NOT DETECTED NOT DETECTED   Serratia marcescens NOT DETECTED NOT DETECTED    Haemophilus influenzae NOT DETECTED NOT DETECTED   Neisseria meningitidis NOT DETECTED NOT DETECTED   Pseudomonas aeruginosa NOT DETECTED NOT DETECTED   Stenotrophomonas maltophilia NOT DETECTED NOT DETECTED   Candida albicans NOT DETECTED NOT DETECTED   Candida auris NOT DETECTED NOT DETECTED   Candida glabrata NOT DETECTED NOT DETECTED   Candida krusei NOT DETECTED NOT DETECTED   Candida parapsilosis NOT DETECTED NOT DETECTED   Candida tropicalis NOT DETECTED NOT DETECTED   Cryptococcus neoformans/gattii NOT DETECTED NOT DETECTED

## 2024-07-22 NOTE — H&P (Incomplete)
 Andre Stout FMW:[id number] DOB: Jan 06, 1940 DOA: 07/21/2024     PCP: Jason Leita Repine, FNP   Outpatient Specialists:  CARDS:  Dr. Darryle ONEIDA Decent, MD    Patient arrived to ER on 07/21/24 at 1707 Referred by Attending Dreama Longs, MD   Patient coming from:    home Lives  With family      Chief Complaint:  Chief Complaint  Patient presents with  . Hyperglycemia    HPI: SEVASTIAN Stout is a 84 y.o. 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 Sp percutaneous biliary drain    Presented with  encephalopathy Patient arrives for hyperglycemia and weakness.  Family has had increased generalized fatigue Recently was taken off of insulin  and his CBG was elevated 452 History of DKA in the past at baseline able to walk with a walker and has history of dementia blood pressure 120/49 Satting 98% on 6 L Patient unable to provide his own history appears to be sick Noted to have lactic acid of 4 tachypneic and febrile Lethargic with abdominal tenderness But sugar elevated to 415 IV fluid administered started on cefepime  metronidazole  vancomycin  Chest x-ray showing aspiration pneumonia and cardiomegaly As well as new L1 compression fracture Gas in the bladder CT head showing acute left posterior convexity subdural hematoma no midline shift    Family states patient had a fall about a week ago few days ago started having more nausea and vomiting had increased depth and rate of respirations family was suspicious that he may have DKA and brought him to emergency department Denies significant ETOH intake *** Does not smoke*** but interested in quitting***      Regarding pertinent Chronic problems:    Hyperlipidemia - on statins Lipitor (atorvastatin )  Lipid Panel     Component Value Date/Time   CHOL 115 11/14/2023 1058   TRIG 110.0 11/14/2023  1058   HDL 48.10 11/14/2023 1058   CHOLHDL 2 11/14/2023 1058   VLDL 22.0 11/14/2023 1058   LDLCALC 45 11/14/2023 1058   LDLDIRECT 121.0 03/25/2019 1008     HTN on Norvasc    last echo  Recent Results (from the past 56199 hours)  ECHOCARDIOGRAM COMPLETE   Collection Time: 01/19/24  1:00 PM  Result Value   Weight 2,640   Height 65   BP 97/61   S' Lateral 1.90   Area-P 1/2 6.11   Est EF 65 - 70%   Narrative      ECHOCARDIOGRAM REPORT         1. Left ventricular ejection fraction, by estimation, is 65 to 70%. The left ventricle has normal function. The left ventricle has no regional wall motion abnormalities. There is mild left ventricular hypertrophy. Indeterminate diastolic filling due to  E-A fusion.  2. Right ventricular systolic function is hyperdynamic. The right ventricular size is normal.  3. Left atrial size was moderately dilated.  4. The mitral valve is normal in structure. Trivial mitral valve regurgitation. No evidence of mitral stenosis.  5. The aortic valve is tricuspid. There is mild thickening of the aortic valve. Aortic valve regurgitation is not visualized. Aortic valve sclerosis is present, with no evidence of aortic valve stenosis.  6. The inferior vena cava is normal in size with greater than 50% respiratory variability, suggesting right atrial pressure of 3 mmHg.                DM  2 -  Lab Results  Component Value Date   HGBA1C 10.9 (A) 05/07/2024   on insulin     *** Asthma -well *** controlled on home inhalers/ nebs                     *** COPD - not **followed by pulmonology *** not  on baseline oxygen  *L,    *** OSA -on nocturnal oxygen, *CPAP, *noncompliant with CPAP    Hx of CVA -  with residual deficits on Aspirin  81 mg,  , Plavix         CKD stage IIIb  baseline Cr 1.5 CrCl cannot be calculated (Unknown ideal weight.).  Lab Results  Component Value Date   CREATININE 1.50 (H) 07/21/2024   CREATININE 1.68 (H) 07/21/2024    CREATININE 0.96 03/05/2024   Lab Results  Component Value Date   NA 133 (L) 07/21/2024   NA 134 (L) 07/21/2024   CL 98 07/21/2024   K 3.8 07/21/2024   K 3.8 07/21/2024   CO2 22 07/21/2024   BUN 20 07/21/2024   CREATININE 1.50 (H) 07/21/2024   GFRNONAA 40 (L) 07/21/2024   CALCIUM  9.4 07/21/2024   PHOS 3.1 01/17/2024   ALBUMIN  2.2 (L) 07/21/2024   GLUCOSE 390 (H) 07/21/2024    Chronic anemia - baseline hg Hemoglobin & Hematocrit  Recent Labs    03/05/24 0509 07/21/24 1800 07/21/24 1837  HGB 13.1 10.9* 11.6*  12.2*   Iron/TIBC/Ferritin/ %Sat No results found for: IRON, TIBC, FERRITIN, IRONPCTSAT       Cancer: Prostate cancer     While in ER: Clinical Course as of 07/21/24 2252  Tue Jul 21, 2024  1934 Patient now much more alert.  He is speaking in sentences but is still confused and appears to also be very hard of hearing.  He is far more alert than previous evaluation [AH]  2007 Beta-Hydroxybutyric Acid(!): 0.28 [AH]  2131 CT Head Wo Contrast CT head with 8 mm subdural hematoma.  Patient appears to be on Plavix  [AH]  2205  [facility] plavix  repeat scan in 6 hrs Systolic <160 keppra  500 bid [AH]    Clinical Course User Index [AH] Arloa Chroman, PA-C     In ER decreased urine output, hx of prostate cancer not able to place foley    Lab Orders         Culture, blood (Routine x 2)         Resp panel by RT-PCR (RSV, Flu A&B, Covid) Anterior Nasal Swab         Comprehensive metabolic panel         CBC with Differential         Protime-INR         Urinalysis, w/ Reflex to Culture (Infection Suspected) -Urine, Clean Catch         Beta-hydroxybutyric acid         Lipase, blood         CBG monitoring, ED         I-Stat Lactic Acid, ED         I-stat chem 8, ED (not at [facility], [facility] or [facility])         I-Stat venous blood gas, ([facility] ED, [facility], [facility])      CT HEADAcute left posterior convexity subdural hematoma, measuring up to 8 mm, without midline shift.      CXR - Patchy ill-defined airspace opacities in the right mid to upper lung  suspicious for pneumonia. Radiographic follow-up to resolution is recommended.  CTabd/pelvis chest -     -  Multifocal patchy right lung opacities, suspicious for pneumonia, possibly on the basis of aspiration. 2. Moderate compression fracture deformity at L1, new from prior and possibly acute, with minimal retropulsion. 3. Moderate nondependent gas in the bladder, correlate for cystitis versus instrumentation.    Following Medications were ordered in ER: Medications  acetaminophen  (TYLENOL ) suppository 650 mg (650 mg Rectal Given 07/21/24 1816)  lactated ringers  bolus 2,000 mL (0 mLs Intravenous Stopped 07/21/24 1938)  ceFEPIme  (MAXIPIME ) 2 g in sodium chloride  0.9 % 100 mL IVPB (0 g Intravenous Stopped 07/21/24 1939)  metroNIDAZOLE  (FLAGYL ) IVPB 500 mg (0 mg Intravenous Stopped 07/21/24 2055)  vancomycin  (VANCOCIN ) IVPB 1000 mg/200 mL premix (0 mg Intravenous Stopped 07/21/24 2140)  lactated ringers  bolus 1,000 mL (0 mLs Intravenous Stopped 07/21/24 2021)    And  lactated ringers  bolus 1,000 mL (0 mLs Intravenous Stopped 07/21/24 2219)    And  lactated ringers  bolus 250 mL (0 mLs Intravenous Stopped 07/21/24 2055)  lidocaine  (XYLOCAINE ) 2 % jelly 1 Application (1 Application Topical Given by Other 07/21/24 2000)    _______________________________________________________ ER Provider Called:     neurosurgery  Dr.Tomlinson They Recommend admit to medicine repeat CT in 6 h to moniotr for progression and hold Plavix        ED Triage Vitals [07/21/24 1731]  Encounter Vitals Group     BP 107/68     Girls Systolic BP Percentile      Girls Diastolic BP Percentile      Boys Systolic BP Percentile      Boys Diastolic BP Percentile      Pulse Rate 88     Resp (!) 25     Temp (!) 101.8 F (38.8 C)     Temp Source Oral     SpO2 93 %     Weight      Height      Head Circumference      Peak Flow      Pain  Score      Pain Loc      Pain Education      Exclude from Growth Chart   TMAX(24)@     _________________________________________ Significant initial  Findings: Abnormal Labs Reviewed  COMPREHENSIVE METABOLIC PANEL WITH GFR - Abnormal; Notable for the following components:      Result Value   Sodium 132 (*)    Chloride 97 (*)    Glucose, Bld 382 (*)    Creatinine, Ser 1.68 (*)    Albumin  2.2 (*)    GFR, Estimated 40 (*)    All other components within normal limits  CBC WITH DIFFERENTIAL/PLATELET - Abnormal; Notable for the following components:   WBC 19.8 (*)    Hemoglobin 10.9 (*)    HCT 33.9 (*)    MCV 77.0 (*)    MCH 24.8 (*)    RDW 17.6 (*)    Platelets 464 (*)    Neutro Abs 18.0 (*)    Abs Immature Granulocytes 0.10 (*)    All other components within normal limits  PROTIME-INR - Abnormal; Notable for the following components:   Prothrombin Time 16.5 (*)    INR 1.3 (*)    All other components within normal limits  BETA-HYDROXYBUTYRIC ACID - Abnormal; Notable for the following components:   Beta-Hydroxybutyric Acid 0.28 (*)    All other components within normal limits  CBG MONITORING, ED -  Abnormal; Notable for the following components:   Glucose-Capillary 415 (*)    All other components within normal limits  I-STAT CG4 LACTIC ACID, ED - Abnormal; Notable for the following components:   Lactic Acid, Venous 5.4 (*)    All other components within normal limits  I-STAT CHEM 8, ED - Abnormal; Notable for the following components:   Sodium 133 (*)    Creatinine, Ser 1.50 (*)    Glucose, Bld 390 (*)    TCO2 21 (*)    Hemoglobin 12.2 (*)    HCT 36.0 (*)    All other components within normal limits  I-STAT VENOUS BLOOD GAS, ED - Abnormal; Notable for the following components:   pCO2, Ven 33.8 (*)    pO2, Ven 46 (*)    Sodium 134 (*)    HCT 34.0 (*)    Hemoglobin 11.6 (*)    All other components within normal limits  I-STAT CG4 LACTIC ACID, ED - Abnormal; Notable for  the following components:   Lactic Acid, Venous 4.0 (*)    All other components within normal limits      _________________________ Troponin ***ordered Cardiac Panel (last 3 results) No results for input(s): CKTOTAL, CKMB, TROPONINIHS, RELINDX in the last 72 hours.   ECG: Ordered Personally reviewed and interpreted by me showing: HR : 94 Rhythm:unclear rhythm , concern for wenkebach Ventricular premature complex Inferior infarct, old Anterior infarct, old Lateral leads are also involved Prolonged QT interval No significant change since last tracing QTC 417   ____________________ This patient meets SIRS Criteria and may be septic.   The recent clinical data is shown below. Vitals:   07/21/24 2130 07/21/24 2145 07/21/24 2200 07/21/24 2220  BP: (!) 116/58 (!) 107/53 (!) 108/51 (!) 103/53  Pulse: 74 69 70 70  Resp: (!) 21 20 (!) 22 19  Temp:      TempSrc:      SpO2: 96% 95% 99% 96%      WBC     Component Value Date/Time   WBC 19.8 (H) 07/21/2024 1800   LYMPHSABS 0.8 07/21/2024 1800   MONOABS 0.9 07/21/2024 1800   EOSABS 0.0 07/21/2024 1800   BASOSABS 0.1 07/21/2024 1800    Lactic Acid, Venous    Component Value Date/Time   LATICACIDVEN 4.0 (HH) 07/21/2024 2000       Procalcitonin   Ordered      UA  ordered    Results for orders placed or performed during the hospital encounter of 07/21/24  Resp panel by RT-PCR (RSV, Flu A&B, Covid) Anterior Nasal Swab     Status: None   Collection Time: 07/21/24  6:17 PM   Specimen: Anterior Nasal Swab  Result Value Ref Range Status   SARS Coronavirus 2 by RT PCR NEGATIVE NEGATIVE Final   Influenza A by PCR NEGATIVE NEGATIVE Final   Influenza B by PCR NEGATIVE NEGATIVE Final         Resp Syncytial Virus by PCR NEGATIVE NEGATIVE Final          ABX started Antibiotics Given (last 72 hours)     Date/Time Action Medication Dose Rate   07/21/24 1839 New Bag/Given   ceFEPIme  (MAXIPIME ) 2 g in sodium chloride   0.9 % 100 mL IVPB 2 g 200 mL/hr   07/21/24 1937 New Bag/Given   metroNIDAZOLE  (FLAGYL ) IVPB 500 mg 500 mg 100 mL/hr   07/21/24 2017 New Bag/Given   vancomycin  (VANCOCIN ) IVPB 1000 mg/200 mL premix 1,000 mg 200 mL/hr  No results found for the last 90 days.    ____ Venous  Blood Gas result:  pH  7.420 Sodium 134 Low  mmol/L   pCO2, Ven 33.8 Low  mmHg Potassium 3.8 mmol/L  pO2, Ven 46 High  mmHg       __________________________________________________________ Recent Labs  Lab 07/21/24 1800 07/21/24 1837  NA 132* 134*  133*  K 4.0 3.8  3.8  CO2 22  --   GLUCOSE 382* 390*  BUN 19 20  CREATININE 1.68* 1.50*  CALCIUM  9.4  --     Cr  up from baseline Lab Results  Component Value Date   CREATININE 1.50 (H) 07/21/2024   CREATININE 1.68 (H) 07/21/2024   CREATININE 0.96 03/05/2024    Recent Labs  Lab 07/21/24 1800  AST 19  ALT 8  ALKPHOS 92  BILITOT 1.1  PROT 6.5  ALBUMIN  2.2*   Lab Results  Component Value Date   CALCIUM  9.4 07/21/2024   PHOS 3.1 01/17/2024        Plt: Lab Results  Component Value Date   PLT 464 (H) 07/21/2024       Recent Labs  Lab 07/21/24 1800 07/21/24 1837  WBC 19.8*  --   NEUTROABS 18.0*  --   HGB 10.9* 11.6*  12.2*  HCT 33.9* 34.0*  36.0*  MCV 77.0*  --   PLT 464*  --     HG/HCT  stable,      Component Value Date/Time   HGB 12.2 (L) 07/21/2024 1837   HGB 11.6 (L) 07/21/2024 1837   HCT 36.0 (L) 07/21/2024 1837   HCT 34.0 (L) 07/21/2024 1837   MCV 77.0 (L) 07/21/2024 1800      Recent Labs  Lab 07/21/24 1800  LIPASE 21   No results for input(s): AMMONIA in the last 168 hours.    _______________________________________________ Hospitalist was called for admission for  sepsis ,aspiration PNA, subdural hematoma   The following Work up has been ordered so far:  Orders Placed This Encounter  Procedures  . Culture, blood (Routine x 2)  . Resp panel by RT-PCR (RSV, Flu A&B, Covid) Anterior Nasal Swab   . DG Chest Port 1 View  . CT CHEST ABDOMEN PELVIS WO CONTRAST  . CT Head Wo Contrast  . Comprehensive metabolic panel  . CBC with Differential  . Protime-INR  . Urinalysis, w/ Reflex to Culture (Infection Suspected) -Urine, Clean Catch  . Beta-hydroxybutyric acid  . Lipase, blood  . Diet NPO time specified  . Notify physician (specify)  Specify: Notify provider for possible Code Sepsis  . Document height and weight  . Assess and Document Glasgow Coma Scale  . Document vital signs within 1-hour of fluid bolus completion. Notify provider of abnormal vital signs despite fluid resuscitation.  . DO NOT delay antibiotics if unable to obtain blood culture.  . Refer to Sidebar Report: Sepsis Sidebar ED/IP  . Notify provider for difficulties obtaining IV access.  . Insert peripheral IV x 2  . Initiate Carrier Fluid Protocol  . Bladder scan  . Apply TLSO brace  . Maintain TLSO brace  . monitoring by pharmacy  . Consult to neurosurgery  . Consult to intensivist  . Consult to hospitalist  . CBG monitoring, ED  . I-Stat Lactic Acid, ED  . I-stat chem 8, ED (not at [facility], [facility] or [facility])  . I-Stat venous blood gas, ([facility], [facility], [facility])  . EKG 12-Lead  . ED EKG  OTHER Significant initial  Findings:  labs showing:     DM  labs:  HbA1C: Recent Labs    11/14/23 1044 03/05/24 0509 05/07/24 1031  HGBA1C 10.7* 10.2* 10.9*       CBG (last 3)  Recent Labs    07/21/24 1725  GLUCAP 415*          Cultures:    Component Value Date/Time   SDES BLOOD LEFT HAND 01/18/2024 1546   SPECREQUEST  01/18/2024 1546    BOTTLES DRAWN AEROBIC AND ANAEROBIC Blood Culture results may not be optimal due to an inadequate volume of blood received in culture bottles   CULT  01/18/2024 1546    NO GROWTH 5 DAYS Performed at [facility], [address] [place], [place] [address]    REPTSTATUS 01/23/2024 FINAL 01/18/2024 1546     Radiological Exams on Admission: CT CHEST ABDOMEN PELVIS WO  CONTRAST Result Date: 07/21/2024 EXAM: CT CHEST, ABDOMEN AND PELVIS WITHOUT CONTRAST 07/21/2024 09:21:31 PM TECHNIQUE: CT of the chest, abdomen and pelvis was performed without the administration of intravenous contrast. Multiplanar reformatted images are provided for review. Automated exposure control, iterative reconstruction, and/or weight based adjustment of the mA/kV was utilized to reduce the radiation dose to as low as reasonably achievable. COMPARISON: CT abdomen dated 03/03/24. CLINICAL HISTORY: Sepsis, hyperglycemia and weakness. Daughter endorses increased weakness. Taking all of insulin . CBG 452. Hx of DKA. Baseline able to walk with a walker, alert but unable to answer orientation questions. No dx of dementia. FINDINGS: Motion degraded images. CHEST: MEDIASTINUM: Heart and pericardium are unremarkable. The central airways are clear. Mild 3-vessel coronary atherosclerosis. Mild thoracic aortic atherosclerosis. THORACIC LYMPH NODES: No mediastinal, hilar or axillary lymphadenopathy. LUNGS AND PLEURA: Multifocal patchy right lung opacities, suspicious for pneumonia possibly on the basis of aspiration. No pleural effusion or pneumothorax. ABDOMEN AND PELVIS: LIVER: The liver is unremarkable. GALLBLADDER AND BILE DUCTS: Layering small gallstones (image 86), without associated inflammatory changes. No biliary ductal dilatation. SPLEEN: No acute abnormality. PANCREAS: No acute abnormality. ADRENAL GLANDS: No acute abnormality. KIDNEYS, URETERS AND BLADDER: 10.0 cm simple right renal cyst (image 67), benign (Bosniak 1). Per consensus, no follow-up is needed for simple Bosniak type 1 and 2 renal cysts, unless the patient has a malignancy history or risk factors. No stones in the kidneys or ureters. No hydronephrosis. No perinephric or periureteral stranding. Moderate nondependent gas in the bladder, correlate for a cystitis versus instrumentation. GI AND BOWEL: Stomach demonstrates no acute abnormality. There is  no bowel obstruction. Appendix is not discretely visualized. REPRODUCTIVE ORGANS: Brachytherapy seeds in the prostate. PERITONEUM AND RETROPERITONEUM: No ascites. No free air. VASCULATURE: Atherosclerotic calcifications of the abdominal aorta and branch vessels. ABDOMINAL AND PELVIS LYMPH NODES: No lymphadenopathy. REPRODUCTIVE ORGANS: No acute abnormality. BONES AND SOFT TISSUES: Mild degenerative changes of the thoracic spine. Moderate compression fracture deformity at L1 (sagittal image 61), new from prior and possibly acute, with minimal retropulsion. IMPRESSION: 1. Multifocal patchy right lung opacities, suspicious for pneumonia, possibly on the basis of aspiration. 2. Moderate compression fracture deformity at L1, new from prior and possibly acute, with minimal retropulsion. 3. Moderate nondependent gas in the bladder, correlate for cystitis versus instrumentation. Electronically signed by: Pinkie Pebbles MD 07/21/2024 09:33 PM EDT RP Workstation: [id number]   CT Head Wo Contrast Result Date: 07/21/2024 EXAM: CT HEAD WITHOUT CONTRAST 07/21/2024 09:21:31 PM TECHNIQUE: CT of the head was performed without the administration of intravenous contrast. Automated exposure control, iterative reconstruction, and/or weight  based adjustment of the mA/kV was utilized to reduce the radiation dose to as low as reasonably achievable. COMPARISON: 01/17/2024 CLINICAL HISTORY: Mental status change, unknown cause. Hyperglycemia and weakness. Daughter endorses increased weakness. Taking all of insulin . CBG 452. Hx of DKA. Baseline able to walk with a walker, alert but unable to answer orientation questions. No dx of dementia. FINDINGS: BRAIN AND VENTRICLES: Global cortical atrophy. Subcortical and periventricular small vessel ischemic changes. No midline shift. ORBITS: No acute abnormality. SINUSES: No acute abnormality. SOFT TISSUES AND SKULL: Left posterior convexity subdural hematoma, measuring up to 8 mm in thickness on  coronal imaging, acute. Chronic opacification of the left mastoid air cells. IMPRESSION: 1. Acute left posterior convexity subdural hematoma, measuring up to 8 mm, without midline shift. 2. Critical Value/emergent results were called by telephone at the time of interpretation on 07/21/2024 at 2128 hrs to provider Dr Dreama, who verbally acknowledged these results. Electronically signed by: Pinkie Pebbles MD 07/21/2024 09:30 PM EDT RP Workstation: [id number]   DG Chest Port 1 View Result Date: 07/21/2024 CLINICAL DATA:  Sepsis weakness EXAM: PORTABLE CHEST 1 VIEW COMPARISON:  01/17/2024 FINDINGS: Left lung grossly clear. Patchy ill-defined airspace opacities in the right mid to upper lung. Cardiomegaly with aortic atherosclerosis. No pleural effusion or pneumothorax IMPRESSION: Patchy ill-defined airspace opacities in the right mid to upper lung suspicious for pneumonia. Radiographic follow-up to resolution is recommended. Cardiomegaly Electronically Signed   By: Luke Bun M.D.   On: 07/21/2024 18:49   _______________________________________________________________________________________________________ Latest  Blood pressure (!) 103/53, pulse 70, temperature 98.6 F (37 C), temperature source Axillary, resp. rate 19, SpO2 96%.   Vitals  labs and radiology finding personally reviewed  Review of Systems:    Pertinent positives include:  nausea, vomiting, increased rate of breathing falls  Constitutional:  No weight loss, night sweats, Fevers, chills, fatigue, weight loss  HEENT:  No headaches, Difficulty swallowing,Tooth/dental problems,Sore throat,  No sneezing, itching, ear ache, nasal congestion, post nasal drip,  Cardio-vascular:  No chest pain, Orthopnea, PND, anasarca, dizziness, palpitations.no Bilateral lower extremity swelling  GI:  No heartburn, indigestion, abdominal pain,  diarrhea, change in bowel habits, loss of appetite, melena, blood in stool, hematemesis Resp:  no  shortness of breath at rest. No dyspnea on exertion, No excess mucus, no productive cough, No non-productive cough, No coughing up of blood.No change in color of mucus.No wheezing. Skin:  no rash or lesions. No jaundice GU:  no dysuria, change in color of urine, no urgency or frequency. No straining to urinate.  No flank pain.  Musculoskeletal:  No joint pain or no joint swelling. No decreased range of motion. No back pain.  Psych:  No change in mood or affect. No depression or anxiety. No memory loss.  Neuro: no localizing neurological complaints, no tingling, no weakness, no double vision, no gait abnormality, no slurred speech, no confusion  All systems reviewed and apart from HOPI all are negative _______________________________________________________________________________________________ Past Medical History:   Past Medical History:  Diagnosis Date  . Diabetes mellitus without complication (HCC)   . Glaucoma   . Heart block    complete heart block  . Hypertension   . Prostate cancer (HCC)    Been 3-4 years ago      Past Surgical History:  Procedure Laterality Date  . CATARACT EXTRACTION Bilateral   . INSERTION PROSTATE RADIATION SEED    . IR EXCHANGE BILIARY DRAIN  08/19/2023  . IR EXCHANGE BILIARY DRAIN  12/19/2023  . IR  EXCHANGE BILIARY DRAIN  01/20/2024  . IR PERC CHOLECYSTOSTOMY  08/17/2022  . IR RADIOLOGIST EVAL & MGMT  09/28/2022  . IR RADIOLOGIST EVAL & MGMT  10/10/2022    Social History:  Ambulatory *** independently cane, walker  wheelchair bound, bed bound     reports that he quit smoking about 49 years ago. His smoking use included cigarettes. He started smoking about 53 years ago. He has a 2 pack-year smoking history. He has never used smokeless tobacco. He reports that he does not drink alcohol and does not use drugs.     Family History:   Family History  Problem Relation Age of Onset  . Other Mother        unknown medical history  . Diabetes  Father   . Diabetes Paternal Grandfather    ______________________________________________________________________________________________ Allergies: No Known Allergies   Prior to Admission medications   Medication Sig Start Date End Date Taking? Authorizing Provider  acetaminophen  (TYLENOL ) 500 MG tablet Take 1,000 mg by mouth daily as needed (pain).    [provider]  amLODipine  (NORVASC ) 10 MG tablet Take 1 tablet (10 mg total) by mouth daily. 11/15/23   Jason Leita Repine, FNP  aspirin  81 MG chewable tablet Chew 81 mg by mouth daily. 01/06/22   [provider]  atorvastatin  (LIPITOR) 40 MG tablet TAKE 1 TABLET BY MOUTH EVERY DAY 07/16/24   Murray, Laura Woodruff, FNP  Azelastine  HCl 137 MCG/SPRAY SOLN PLACE 1 SPRAY INTO BOTH NOSTRILS 2 (TWO) TIMES DAILY AS NEEDED. NEEDS APPT 07/13/24   Jason Leita Repine, FNP  Blood Glucose Monitoring Suppl (ACCU-CHEK GUIDE) w/Device KIT Use As Directed 08/24/22   Christobal Guadalajara, MD  clopidogrel  (PLAVIX ) 75 MG tablet TAKE 1 TABLET BY MOUTH EVERY DAY 07/16/24   Jason Leita Repine, FNP  Continuous Blood Gluc Receiver (FREESTYLE LIBRE 3 READER) DEVI 1 each by Does not apply route daily. 01/22/23   Trixie File, MD  Continuous Blood Gluc Sensor (FREESTYLE LIBRE 3 SENSOR) MISC 1 each by Does not apply route every 14 (fourteen) days. 01/22/23   Trixie File, MD  insulin  aspart protamine - aspart (NOVOLOG  70/30 FLEXPEN) (70-30) 100 UNIT/ML FlexPen Inject under skin 30 units before b'fast and 15-20 units before dinner Take the insulin  10-15 min before meals. 05/07/24   Trixie File, MD  Insulin  Pen Needle 32G X 4 MM MISC Use to inject insulin  3 times a day 11/15/23   Trixie File, MD  Insulin  Syringe-Needle U-100 (BD INSULIN  SYRINGE U/F) 31G X 5/16 1 ML MISC USE 2 DAILY 11/21/22   Trixie File, MD  magnesium  oxide (MAG-OX) 400 (240 Mg) MG tablet TAKE 1 TABLET BY MOUTH TWICE A DAY 02/28/24   Jason Leita Repine, FNP   Multiple Vitamins-Minerals (PRESERVISION AREDS 2) CAPS Take 1 capsule by mouth at bedtime.    [provider]  ondansetron  (ZOFRAN ) 4 MG tablet Take 4 mg by mouth every 8 (eight) hours as needed. 12/01/23   [provider]  AISHA PASTOR LANCETS 33G MISC Use to check blood sugar 4 times per day. Dx code: E11.9 03/27/16   Kassie Mallick, MD  [facility] ULTRA test strip USE TO MONITOR GLUCOSE LEVELS 4 TIMES PER DAY E11.9 06/10/19   Kassie Mallick, MD  pantoprazole  (PROTONIX ) 40 MG tablet Take 1 tablet (40 mg total) by mouth 2 (two) times daily before a meal. 11/15/23   Jason Leita Repine, FNP    ___________________________________________________________________________________________________ Physical Exam:    07/21/2024   10:20 PM 07/21/2024  10:00 PM 07/21/2024    9:45 PM  Vitals with BMI  Systolic 103 108 892  Diastolic 53 51 53  Pulse 70 70 69     1. General:  in No  Acute distress   Chronically ill   -appearing 2. Psychological: Alert and   Oriented to self 3. Head/ENT:   Moist *** Dry Mucous Membranes                          Head Non traumatic, neck supple                          Normal *** Poor Dentition 4. SKIN: normal *** decreased Skin turgor,  Skin clean Dry and intact no rash    5. Heart: Regular rate and rhythm no*** Murmur, no Rub or gallop 6. Lungs: ***Clear to auscultation bilaterally, no wheezes or crackles   7. Abdomen: Soft, ***non-tender, Non distended *** obese ***bowel sounds present 8. Lower extremities: no clubbing, cyanosis, no ***edema 9. Neurologically Grossly intact, moving all 4 extremities equally *** strength 5 out of 5 in all 4 extremities cranial nerves II through XII intact 10. MSK: Normal range of motion    Chart has been reviewed  ______________________________________________________________________________________________  Assessment/Plan 84 y.o. 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 Sp percutaneous biliary drain  Admitted for sepsis ,aspiration PNA, subdural hematoma   Present on Admission: . Prolonged QT interval . Acute encephalopathy . Benign essential HTN . Hyperlipidemia . Prostate cancer (HCC) . Sepsis (HCC) . Aspiration pneumonia (HCC) . Acute respiratory failure with hypoxia (HCC) . Mobitz type 1 second degree atrioventricular block . CKD (chronic kidney disease) stage 3, GFR 30-59 ml/min (HCC)     Prolonged QT interval - will monitor on tele avoid QT prolonging medications, rehydrate correct electrolytes   Uncontrolled type 2 diabetes mellitus with hyperglycemia, with long-term current use of insulin  (HCC) No evidence of DKA at this time Order sliding scale And insulin  Lantus  25 units  Acute encephalopathy In the setting of subdural hematoma Neuro surgery consulted No acute surgical intervention at this time hold Plavix  repeat CT in 6 hours Keppra  500 mg  bid  Benign essential HTN Allow permissive hypertension  History of stroke Hold Plavix  and aspirin  given subdural hematoma  Hyperlipidemia At this point patient unable to tolerate p.o. when able to would resume Lipitor 40 mg daily  Prostate cancer (HCC) Difficult to place foley  Sepsis (HCC)  -SIRS criteria met with  elevated white blood cell count,       Component Value Date/Time   WBC 19.8 (H) 07/21/2024 1800   LYMPHSABS 0.8 07/21/2024 1800     ,   fever   RR >20 Today's Vitals   07/21/24 2230 07/21/24 2300 07/21/24 2317 07/21/24 2318  BP: (!) 104/38 (!) 100/53 (!) 106/47   Pulse: 66 68 69 68  Resp: 17 15 16 20   Temp:      TempSrc:      SpO2: 97% 97% 95% 98%        -Most likely source being: pulmonary,    Patient meeting criteria for Severe sepsis with    evidence of end organ damage/organ dysfunction such as    elevated lactic acid >2     Component Value  Date/Time   LATICACIDVEN 4.0 (HH) 07/21/2024 2000  acute metabolic encephalopathy   Acute hypoxia requiring new supplemental oxygen, SpO2: 98 % O2 Flow Rate (L/min): 3 L/min   - Obtain serial lactic acid and procalcitonin level.  - Initiated IV antibiotics in ER: Antibiotics Given (last 72 hours)     Date/Time Action Medication Dose Rate   07/21/24 1839 New Bag/Given   ceFEPIme  (MAXIPIME ) 2 g in sodium chloride  0.9 % 100 mL IVPB 2 g 200 mL/hr   07/21/24 1937 New Bag/Given   metroNIDAZOLE  (FLAGYL ) IVPB 500 mg 500 mg 100 mL/hr   07/21/24 2017 New Bag/Given   vancomycin  (VANCOCIN ) IVPB 1000 mg/200 mL premix 1,000 mg 200 mL/hr       Will continue  on : Unasyn  and Vanco   - await results of blood and urine culture  - Rehydrate aggressively  Intravenous fluids were administered     11:32 PM   Aspiration pneumonia (HCC) Order Unasyn  obtain sputum cultures  - -Patient presenting with  productive cough, fever    hypoxia  , and infiltrate in   lower lobe on chest x-ray -Infiltrate on CXR and 2-3 characteristics (fever, leukocytosis, purulent sputum) are consistent with pneumonia. -This appears to be most likely community-acquired pneumonia.   -  Suspect aspiration given decreased mental status     Obtain:  sputum cultures,                                  influenza serologies negative                  COVID PCR negative                   blood cultures and sputum cultures ordered                   strep pneumo UA antigen,                   check for Legionella antigen.                Provide oxygen as needed.    Acute respiratory failure with hypoxia (HCC)  this patient has acute respiratory failure with Hypoxia as documented by the presence of following: O2 saturatio< 90% on RA   Likely due to:   Pneumonia,    Continuous pulse ox   check Pulse ox with ambulation prior to discharge    may need  TC consult for home O2 set up    flutter valve ordered   Mobitz type 1  second degree atrioventricular block Followed by cardiology no pacemaker  CKD (chronic kidney disease) stage 3, GFR 30-59 ml/min (HCC)  -chronic avoid nephrotoxic medications such as NSAIDs, Vanco Zosyn  combo,  avoid hypotension, continue to follow renal function    Other plan as per orders.  DVT prophylaxis:  SCD      Code Status:    Code Status: Prior FULL CODE *** DNR/DNI ***comfort care as per patient ***family  I had personally discussed CODE STATUS with patient and family*  ACP *** none has been reviewed ***   Family Communication:   Family   at  Bedside  plan of care was discussed on the phone with *** Son, Daughter, Wife, Husband, Sister, Brother , father, mother  Diet  Diet Orders (From admission, onward)     Start     Ordered   07/21/24 1817  Diet NPO time specified  (  Septic presentation on arrival (screening labs, nursing and treatment orders for obvious sepsis))  Diet effective now        07/21/24 1818            Disposition Plan:     likely will need placement for rehabilitation                            Following barriers for discharge:                                                        Electrolytes corrected                               Anemia   stable                             Pain controlled with PO medications                               Afebrile, white count improving able to transition to PO antibiotics                             Will need to be able to tolerate PO                                                        Will need consultants to evaluate patient prior to discharge                   Consult Orders  (From admission, onward)           Start     Ordered   07/21/24 2227  Consult to hospitalist  Pg sent by deloris  Once       Provider:  (Not yet assigned)  Question Answer Comment  Place call to: [facility]   Reason for Consult Admit      07/21/24 2226                               Would benefit from  PT/OT eval prior to DC  Ordered                                       Diabetes care coordinator                   Transition of care consulted                   Nutrition    consulted                                     Palliative care  consulted                      Consults called: Neurosurgery is aware Treatment Team:  Lanis Pupa, MD  Admission status:  ED Disposition     ED Disposition  Admit   Condition  --   Comment  The patient appears reasonably stabilized for admission considering the current resources, flow, and capabilities available in the ED at this time, and I doubt any other [facility] requiring further screening and/or treatment in the ED prior to admission is  present.             inpatient     I Expect 2 midnight stay secondary to severity of patient's current illness need for inpatient interventions justified by the following:  hemodynamic instability despite optimal treatment (tachycardia  hypoxia, )   Severe lab/radiological/exam abnormalities including:   Aspiration pneumonia subdural hematoma and extensive comorbidities including:  DM2      CKD    history of stroke with residual deficits   That are currently affecting medical management.   I expect  patient to be hospitalized for 2 midnights requiring inpatient medical care.  Patient is at high risk for adverse outcome (such as loss of life or disability) if not treated.  Indication for inpatient stay as follows:  Severe change from baseline regarding mental status Hemodynamic instability despite maximal medical therapy,  severe pain requiring acute inpatient management,  inability to maintain oral hydration     New or worsening hypoxia    Need for IV antibiotics, IV fluids,,     Level of care         progressive      tele indefinitely please discontinue once patient no longer qualifies COVID-19 Labs      Royale Lennartz 07/21/2024, 10:52 PM ***  [facility]      after 2 AM please page floor coverage   If 7AM-7PM, please contact the day team taking care of the patient using [email or web address]

## 2024-07-22 NOTE — Progress Notes (Signed)
 Received from ED in bed.

## 2024-07-22 NOTE — ED Notes (Signed)
 Patient transported to CT

## 2024-07-22 NOTE — Assessment & Plan Note (Signed)
 TLCO in place, pain control  Neurosurgery is aware

## 2024-07-22 NOTE — Progress Notes (Signed)
 Physical Therapy Evaluation Patient Details Name: Andre Stout MRN: [id number] DOB: 1940-11-22 Today's Date: 07/22/2024  History of Present Illness  84 y.o. male presents to [facility] 07/21/24 for generalized fatigue and weakness with a fall one week prior. CT head showed acute L posterior convexity SDH. Pt also with acute encephalopathy. CT chest showed moderate compression fx at L1. PMH: AV block, hypertension, type 2 diabetes, prostate cancer, glaucoma, HTNm, Cholecystitis s/p cholecystostomy in 07/2022.   Clinical Impression  History taken from chart review due to decreased level of alertness in today's session. Per chart review, pt ambulated with a RW with history of at least one fall. Pt was not able to follow commands with one instance of opening eyes for <2 seconds. TotalAx2 to roll bilaterally with no active participation from patient. Noted spontaneous movement in B LE's, however, no movement to command. At this time, recommending post-acute rehab <3hrs to work towards independence with mobility and decrease caregiver burden. Pt would benefit from acute skilled PT with current functional limitations listed below (see PT Problem List). Acute PT to follow.         If plan is discharge home, recommend the following: A lot of help with walking and/or transfers;A lot of help with bathing/dressing/bathroom;Assistance with cooking/housework;Direct supervision/assist for medications management;Direct supervision/assist for financial management;Assist for transportation;Help with stairs or ramp for entrance   Can travel by private vehicle   No    Equipment Recommendations Other (comment) (TBD with progress)     Functional Status Assessment Patient has had a recent decline in their functional status and demonstrates the ability to make significant improvements in function in a reasonable and predictable amount of time.     Precautions / Restrictions Precautions Precautions: Fall;Back Precaution  Booklet Issued: No Recall of Precautions/Restrictions: Impaired Required Braces or Orthoses: Spinal Brace Spinal Brace: Thoracolumbosacral orthotic;Applied in sitting position Restrictions Weight Bearing Restrictions Per Provider Order: No      Mobility  Bed Mobility Overal bed mobility: Needs Assistance Bed Mobility: Rolling Rolling: Total assist, +2 for physical assistance, +2 for safety/equipment    General bed mobility comments: toward L and R. Pt with no actve participation    Transfers    General transfer comment: deferred       Balance Overall balance assessment: History of Falls        Pertinent Vitals/Pain Pain Assessment Pain Assessment: Faces Faces Pain Scale: Hurts little more Pain Location: grimaces with L hip flexion PROM Pain Descriptors / Indicators: Discomfort, Grimacing Pain Intervention(s): Limited activity within patient's tolerance, Monitored during session, Repositioned    Home Living Family/patient expects to be discharged to:: Private residence Living Arrangements: Children Available Help at Discharge: Family;Available 24 hours/day Type of Home: House Home Access: Stairs to enter;Ramped entrance   Entrance Stairs-Number of Steps: 5 - information taken from chart review as pt cannot state   Home Layout: One level Home Equipment: Agricultural consultant (2 wheels);Wheelchair - Lawyer Comments: Home living primarily per chart review as pt not verbalizing this session    Prior Function Prior Level of Function : Patient poor historian/Family not available  ADLs Comments: pt unable to report     Extremity/Trunk Assessment   Upper Extremity Assessment Upper Extremity Assessment: Defer to OT evaluation RUE Deficits / Details: min incr tone at rest; PROM WFL LUE Deficits / Details: initiates scratching self at L hip during session, but no other meaningful movement. PROM [facility]    Lower Extremity Assessment Lower Extremity  Assessment: Generalized  weakness;Difficult to assess due to impaired cognition (pain with L hip flexion ROM)    Cervical / Trunk Assessment Cervical / Trunk Assessment: Other exceptions (L1 compression fx)  Communication   Communication Communication: Impaired Factors Affecting Communication:  (no verbalizations this session)    Cognition Arousal: Stuporous Behavior During Therapy: Flat affect   PT - Cognitive impairments: Difficult to assess Difficult to assess due to: Level of arousal      PT - Cognition Comments: opened eyes for <2 seconds. Did not engage or follow commands during session Following commands: Impaired Following commands impaired:  (does not follow commands)     Cueing Cueing Techniques: Verbal cues, Tactile cues     General Comments General comments (skin integrity, edema, etc.): max lethargy     PT Assessment Patient needs continued PT services  PT Problem List Decreased strength;Decreased activity tolerance;Decreased balance;Decreased mobility;Decreased cognition;Decreased knowledge of use of DME;Decreased safety awareness       PT Treatment Interventions DME instruction;Gait training;Functional mobility training;Therapeutic activities;Therapeutic exercise;Balance training;Neuromuscular re-education;Patient/family education    PT Goals (Current goals can be found in the Care Plan section)  Acute Rehab PT Goals Patient Stated Goal: unable to state goal PT Goal Formulation: Patient unable to participate in goal setting Time For Goal Achievement: 08/05/24 Potential to Achieve Goals: Fair    Frequency Min 2X/week     Co-evaluation   Reason for Co-Treatment: Necessary to address cognition/behavior during functional activity;For patient/therapist safety;To address functional/ADL transfers PT goals addressed during session: Mobility/safety with mobility OT goals addressed during session: ADL's and self-care       AM-PAC PT 6 Clicks Mobility   Outcome Measure Help needed turning from your back to your side while in a flat bed without using bedrails?: Total Help needed moving from lying on your back to sitting on the side of a flat bed without using bedrails?: Total Help needed moving to and from a bed to a chair (including a wheelchair)?: Total Help needed standing up from a chair using your arms (e.g., wheelchair or bedside chair)?: Total Help needed to walk in hospital room?: Total Help needed climbing 3-5 steps with a railing? : Total 6 Click Score: 6    End of Session   Activity Tolerance: Patient limited by lethargy Patient left: in bed;with call bell/phone within reach Nurse Communication: Mobility status PT Visit Diagnosis: Other abnormalities of gait and mobility (R26.89);Muscle weakness (generalized) (M62.81);History of falling (Z91.81)    Time: 8943-8890 PT Time Calculation (min) (ACUTE ONLY): 13 min   Charges:   PT Evaluation $PT Eval Moderate Complexity: 1 Mod   PT General Charges $$ ACUTE PT VISIT: 1 Visit        Kate ORN, PT, DPT Secure Chat Preferred  Rehab Office [phone]   Kate BRAVO Wendolyn 07/22/2024, 2:34 PM

## 2024-07-22 NOTE — Evaluation (Signed)
 Occupational Therapy Evaluation Patient Details Name: Andre Stout MRN: [id number] DOB: 1940/11/27 Today's Date: 07/22/2024   History of Present Illness   84 y.o. male presents to [facility] 07/21/24 for generalized fatigue and weakness with a fall one week prior. CT head showed acute L posterior convexity SDH. Pt also with acute encephalopathy. CT chest showed moderate compression fx at L1. PMH: AV block, hypertension, type 2 diabetes, prostate cancer, glaucoma, HTNm, Cholecystitis s/p cholecystostomy in 07/2022.     Clinical Impressions PTA, per chart review, pt at home with family receiving intermittent assist for ADL.On eval, pt max lethargic, needing total A +2 for rolling in bed, no command following. Demonstrates ability to visually scan and locate therapist on R 1x. Pt with PROM BUE WFL. Questionable incr tone in RUE. Suspect pt to need inpatient rehab <3 hours/day at discharge.      If plan is discharge home, recommend the following:   Assistance with cooking/housework;Assist for transportation;Help with stairs or ramp for entrance;Two people to help with bathing/dressing/bathroom;Two people to help with walking and/or transfers (total A)     Functional Status Assessment   Patient has had a recent decline in their functional status and/or demonstrates limited ability to make significant improvements in function in a reasonable and predictable amount of time     Equipment Recommendations   Other (comment) (defer)     Recommendations for Other Services         Precautions/Restrictions   Precautions Precautions: Fall;Back Precaution Booklet Issued: No Recall of Precautions/Restrictions: Impaired Required Braces or Orthoses: Spinal Brace Spinal Brace: Thoracolumbosacral orthotic     Mobility Bed Mobility Overal bed mobility: Needs Assistance Bed Mobility: Rolling Rolling: Total assist, +2 for physical assistance, +2 for safety/equipment         General bed  mobility comments: toward L and R. Pt with no actve participation    Transfers                   General transfer comment: deferred      Balance                                           ADL either performed or assessed with clinical judgement   ADL Overall ADL's : Needs assistance/impaired                                       General ADL Comments: total A at time of eval     Vision   Additional Comments: unable to assess due to level of arousal     Perception         Praxis         Pertinent Vitals/Pain       Extremity/Trunk Assessment Upper Extremity Assessment Upper Extremity Assessment: Defer to OT evaluation RUE Deficits / Details: min incr tone at rest; PROM WFL LUE Deficits / Details: initiates scratching self at L hip during session, but no other meaningful movement. PROM [facility]   Lower Extremity Assessment Lower Extremity Assessment: Generalized weakness;Difficult to assess due to impaired cognition (pain with L hip flexion ROM)   Cervical / Trunk Assessment Cervical / Trunk Assessment: Other exceptions (L1 compression fx)   Communication Communication Communication: Impaired Factors Affecting Communication:  (no verbalizations this session)   Cognition  Arousal: Stuporous Behavior During Therapy: Flat affect Cognition: Difficult to assess Difficult to assess due to: Level of arousal           OT - Cognition Comments: no command following, no verbalizations. total A. Does localize to voice 1x this session and opening eyes                 Following commands: Impaired Following commands impaired:  (does not follow commands)     Cueing  General Comments   Cueing Techniques: Verbal cues;Tactile cues  max lethargy   Exercises     Shoulder Instructions      Home Living Family/patient expects to be discharged to:: Private residence Living Arrangements: Children Available Help at Discharge:  Family;Available 24 hours/day Type of Home: House Home Access: Stairs to enter;Ramped entrance Entrance Stairs-Number of Steps: 5 - information taken from chart review as pt cannot state   Home Layout: One level     Bathroom Shower/Tub: Producer, television/film/video: Standard     Home Equipment: Agricultural consultant (2 wheels);Wheelchair - Sport and exercise psychologist Comments: Home living primarily per chart review as pt not verbalizing this session      Prior Functioning/Environment Prior Level of Function : Patient poor historian/Family not available               ADLs Comments: pt unable to report    OT Problem List: Decreased strength;Decreased activity tolerance;Decreased safety awareness;Decreased cognition   OT Treatment/Interventions: Self-care/ADL training;Therapeutic exercise;DME and/or AE instruction;Therapeutic activities;Patient/family education;Balance training      OT Goals(Current goals can be found in the care plan section)   Acute Rehab OT Goals OT Goal Formulation: Patient unable to participate in goal setting Time For Goal Achievement: 08/05/24 Potential to Achieve Goals: Good   OT Frequency:  Min 2X/week    Co-evaluation   Reason for Co-Treatment: Necessary to address cognition/behavior during functional activity;For patient/therapist safety;To address functional/ADL transfers PT goals addressed during session: Mobility/safety with mobility OT goals addressed during session: ADL's and self-care      AM-PAC OT 6 Clicks Daily Activity     Outcome Measure Help from another person eating meals?: Total Help from another person taking care of personal grooming?: Total Help from another person toileting, which includes using toliet, bedpan, or urinal?: Total Help from another person bathing (including washing, rinsing, drying)?: Total Help from another person to put on and taking off regular upper body clothing?: Total Help from another  person to put on and taking off regular lower body clothing?: Total 6 Click Score: 6   End of Session Equipment Utilized During Treatment: Back brace Nurse Communication: Mobility status  Activity Tolerance: Patient tolerated treatment well Patient left: in bed;with call bell/phone within reach  OT Visit Diagnosis: Unsteadiness on feet (R26.81);Muscle weakness (generalized) (M62.81);Other symptoms and signs involving cognitive function                Time: 8943-8890 OT Time Calculation (min): 13 min Charges:  OT General Charges $OT Visit: 1 Visit OT Evaluation $OT Eval Low Complexity: 1 Low  Elma JONETTA Lebron FREDERICK, OTR/L [facility] Acute Rehabilitation Office: [phone]   Elma JONETTA Lebron 07/22/2024, 2:36 PM

## 2024-07-22 NOTE — Plan of Care (Signed)
  Repeated CT scan showing unchanged and stable 6 mm subdural hematoma.  Beverlie Kurihara, MD [facility] 07/22/2024, 3:34 AM

## 2024-07-22 NOTE — Progress Notes (Addendum)
 Progress Note    RAMSAY BOGNAR  FMW:[id number] DOB: June 15, 1940  DOA: 07/21/2024 PCP: Jason Leita Repine, FNP      Brief Narrative:    Medical records reviewed and are as summarized below:  Andre Stout is a 84 y.o. 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 Family reported that patient had fallen about a week prior to admission.  Family noticed patient had developed nausea and vomiting few days prior to admission he was becoming weaker and confused.  Family brought him to the ED because they thought he might have another DKA..  In the ED, he was febrile with temperature up to 202.3 F, tachypneic with respiratory rate up to 25, heart rate 88, BP 107/68 and O2 sat 97% on 2 L of oxygen  Initial glucose on admission was 415.  Subsequently, patient developed hypoglycemia with glucose dropping to 61.  Lactic acid was 5.4 but subsequently came down to 1.6.  CT chest abdomen and pelvis suspicious for right lung pneumonia possibly aspiration.  Moderate compression fracture deformity at L1, moderate nondependent causing the blood concerning for cystitis versus instrumentation also noted on imaging.  CT head without contrast: IMPRESSION: 1. Acute left posterior convexity subdural hematoma, measuring up to 8 mm, without midline shift.   Assessment/Plan:   Principal Problem:   Severe sepsis (HCC) Active Problems:   Heart block AV complete (HCC)   Acute encephalopathy   Benign essential HTN   History of stroke   Hyperlipidemia   Prostate cancer (HCC)   Prolonged QT interval   Uncontrolled type 2 diabetes mellitus with hyperglycemia, with long-term current use of insulin  (HCC)   Mobitz type 1 second degree  atrioventricular block   Aspiration pneumonia (HCC)   Acute respiratory failure with hypoxia (HCC)   CKD (chronic kidney disease) stage 3, GFR 30-59 ml/min (HCC)   Subdural hematoma (HCC)   L1 vertebral fracture (HCC)   Biliary fistula    Body mass index is 31.25 kg/m.  (Class I obesity)    Severe sepsis secondary to community-acquired pneumonia, probable right aspiration pneumonia, acute UTI: Continue empiric IV Unasyn .  Follow-up urine and blood cultures. Hypotension: Continue IV fluids   Acute kidney injury: Creatinine trending down, from 1.68-1.26.  Continue IV fluids for hydration   Acute metabolic encephalopathy: Patient also confused.  Continue supportive care.   Acute left-sided subdural hematoma (8 mm) without midline shift: Repeat CT head showed stability of SDH, measuring 6 mm.  She has been evaluated by neurosurgery team. Conservative management for now.  Plavix  to be held for 7 to 10 days. Recent fall prior to admission: PT and OT evaluation   Type II DM with hyperglycemia, hypoglycemia: She was hyperglycemic on presentation with glucose of 415.  Glucose dropped to 61. Lantus  25 units nightly had previously been ordered but this will be decreased to 10 units nightly. Use NovoLog  as needed for hyperglycemia. At next insulin  as needed.   Hypomagnesemia: Magnesium  was 1.5.: Replete magnesium  with IV magnesium  sulfate. Hypophosphatemia: Phosphorus 2.1.  Replete with potassium phosphate    Prolonged QTc interval (517): Replete electrolytes and monitor QTc.   L1 compression fracture: TLSO brace recommended.   Comorbidities include History of complete intermittent heart block followed by cardiologist, History of cholecystitis s/p cholecystostomy tube placement in 2023,  s/p percutaneous biliary drain, hypertension, history of stroke (was on aspirin  and Plavix  prior to admission), hyperlipidemia, prostate cancer    Diet Order             DIET - DYS 1 Room  service appropriate? No; Fluid consistency: Thin  Diet effective now                            Consultants: Neurosurgeon  Procedures: None    Medications:    atorvastatin   40 mg Oral Daily   insulin  aspart  0-9 Units Subcutaneous Q4H   insulin  glargine-yfgn  10 Units Subcutaneous QHS   pantoprazole  (PROTONIX ) IV  40 mg Intravenous QHS   phosphorus  500 mg Oral TID   Continuous Infusions:  ampicillin -sulbactam (UNASYN ) IV Stopped (07/22/24 1026)   lactated ringers  125 mL/hr at 07/22/24 0241   magnesium  sulfate bolus IVPB       Anti-infectives (From admission, onward)    Start     Dose/Rate Route Frequency Ordered Stop   07/22/24 0400  Ampicillin -Sulbactam (UNASYN ) 3 g in sodium chloride  0.9 % 100 mL IVPB        3 g 200 mL/hr over 30 Minutes Intravenous Every 6 hours 07/21/24 2359     07/22/24 0000  vancomycin  (VANCOREADY) IVPB 500 mg/100 mL        500 mg 100 mL/hr over 60 Minutes Intravenous  Once 07/21/24 2359 07/22/24 0122   07/21/24 1830  ceFEPIme  (MAXIPIME ) 2 g in sodium chloride  0.9 % 100 mL IVPB        2 g 200 mL/hr over 30 Minutes Intravenous  Once 07/21/24 1818 07/21/24 1939   07/21/24 1830  metroNIDAZOLE  (FLAGYL ) IVPB 500 mg        500 mg 100 mL/hr over 60 Minutes Intravenous  Once 07/21/24 1818 07/21/24 2055   07/21/24 1830  vancomycin  (VANCOCIN ) IVPB 1000 mg/200 mL premix        1,000 mg 200 mL/hr over 60 Minutes Intravenous  Once 07/21/24 1818 07/21/24 2140              Family Communication/Anticipated D/C date and plan/Code Status   DVT prophylaxis: SCDs Start: 07/22/24 0029     Code Status: Full Code  Family Communication: None Disposition Plan: To be determined   Status is: Inpatient Remains inpatient appropriate because: Severe sepsis       Subjective:   Interval events noted.  He is confused, lethargic, unable to provide any history.  Camie, neurosurgery, PA was at the bedside.  Speech therapist was at the  bedside.  Objective:    Vitals:   07/22/24 1015 07/22/24 1030 07/22/24 1045 07/22/24 1100  BP: (!) 95/49 (!) 121/50 (!) 108/54   Pulse: 63 66 74   Resp: (!) 23 (!) 21 (!) 24   Temp:      TempSrc:      SpO2: 91% 91%  93%  Weight:      Height:       No data found.   Intake/Output Summary (Last 24 hours) at 07/22/2024 1219 Last data filed at 07/22/2024 9370 Gross per 24 hour  Intake 1150 ml  Output --  Net 1150 ml   Filed Weights   07/21/24 2343  Weight: 72.6 kg    Exam:  GEN: NAD SKIN: Warm and dry EYES: No pallor or icterus, PERRLA, EOMI ENT: MMM CV: RRR PULM: CTA B ABD: soft, ND, NT, +BS CNS: Lethargic but  awake to verbal stimuli.  Oriented to person only EXT: No edema or tenderness GU: Condom catheter draining amber urine       Data Reviewed:   I have personally reviewed following labs and imaging studies:  Labs: Labs show the following:   Basic Metabolic Panel: Recent Labs  Lab 07/21/24 1800 07/21/24 1837 07/22/24 0120  NA 132* 134*  133* 134*  K 4.0 3.8  3.8 4.1  CL 97* 98 102  CO2 22  --  21*  GLUCOSE 382* 390* 94  BUN 19 20 18   CREATININE 1.68* 1.50* 1.26*  CALCIUM  9.4  --  8.7*  MG  --   --  1.5*  PHOS  --   --  2.1*   GFR Estimated Creatinine Clearance: 37.1 mL/min (A) (by C-G formula based on SCr of 1.26 mg/dL (H)). Liver Function Tests: Recent Labs  Lab 07/21/24 1800 07/22/24 0120  AST 19 24  ALT 8 7  ALKPHOS 92 69  BILITOT 1.1 1.4*  PROT 6.5 5.4*  ALBUMIN  2.2* 1.8*   Recent Labs  Lab 07/21/24 1800  LIPASE 21   No results for input(s): AMMONIA in the last 168 hours. Coagulation profile Recent Labs  Lab 07/21/24 1800  INR 1.3*    CBC: Recent Labs  Lab 07/21/24 1800 07/21/24 1837 07/22/24 0120 07/22/24 0426  WBC 19.8*  --  18.5* 16.9*  NEUTROABS 18.0*  --   --   --   HGB 10.9* 11.6*  12.2* 9.1* 9.2*  HCT 33.9* 34.0*  36.0* 29.0* 29.1*  MCV 77.0*  --  78.8* 78.0*  PLT 464*  --  355 362   Cardiac  Enzymes: Recent Labs  Lab 07/22/24 0120  CKTOTAL 259   BNP (last 3 results) No results for input(s): PROBNP in the last 8760 hours. CBG: Recent Labs  Lab 07/22/24 0430 07/22/24 0511 07/22/24 0628 07/22/24 0758 07/22/24 1132  GLUCAP 61* 157* 135* 141* 100*   D-Dimer: No results for input(s): DDIMER in the last 72 hours. Hgb A1c: Recent Labs    07/21/24 1800  HGBA1C 10.7*   Lipid Profile: No results for input(s): CHOL, HDL, LDLCALC, TRIG, CHOLHDL, LDLDIRECT in the last 72 hours. Thyroid  function studies: Recent Labs    07/22/24 0120  TSH 2.084   Anemia work up: No results for input(s): VITAMINB12, FOLATE, FERRITIN, TIBC, IRON, RETICCTPCT in the last 72 hours. Sepsis Labs: Recent Labs  Lab 07/21/24 1800 07/21/24 1805 07/21/24 2000 07/22/24 0120 07/22/24 0122 07/22/24 0426  PROCALCITON  --   --   --  7.76  --   --   WBC 19.8*  --   --  18.5*  --  16.9*  LATICACIDVEN  --  5.4* 4.0*  --  2.5* 1.6    Microbiology Recent Results (from the past 240 hours)  Culture, blood (Routine x 2)     Status: None (Preliminary result)   Collection Time: 07/21/24  6:00 PM   Specimen: BLOOD  Result Value Ref Range Status   Specimen Description BLOOD RIGHT ANTECUBITAL  Final   Special Requests   Final    BOTTLES DRAWN AEROBIC AND ANAEROBIC Blood Culture adequate volume   Culture   Final    NO GROWTH < 24 HOURS Performed at [facility], [address], [place], [place] [address]    Report Status PENDING  Incomplete  Culture, blood (Routine x 2)     Status: None (Preliminary result)   Collection Time: 07/21/24  6:10  PM   Specimen: BLOOD LEFT ARM  Result Value Ref Range Status   Specimen Description BLOOD LEFT ARM  Final   Special Requests   Final    BOTTLES DRAWN AEROBIC AND ANAEROBIC Blood Culture adequate volume   Culture   Final    NO GROWTH < 24 HOURS Performed at [facility], [address], [place], [place] [address]     Report Status PENDING  Incomplete  Resp panel by RT-PCR (RSV, Flu A&B, Covid) Anterior Nasal Swab     Status: None   Collection Time: 07/21/24  6:17 PM   Specimen: Anterior Nasal Swab  Result Value Ref Range Status   SARS Coronavirus 2 by RT PCR NEGATIVE NEGATIVE Final   Influenza A by PCR NEGATIVE NEGATIVE Final   Influenza B by PCR NEGATIVE NEGATIVE Final    Comment: (NOTE) The Xpert Xpress SARS-CoV-2/FLU/RSV plus assay is intended as an aid in the diagnosis of influenza from Nasopharyngeal swab specimens and should not be used as a sole basis for treatment. Nasal washings and aspirates are unacceptable for Xpert Xpress SARS-CoV-2/FLU/RSV testing.  Fact Sheet for Patients: BloggerCourse.com  Fact Sheet for Healthcare Providers: SeriousBroker.it  This test is not yet approved or cleared by the [place]  FDA and has been authorized for detection and/or diagnosis of SARS-CoV-2 by FDA under an Emergency Use Authorization (EUA). This EUA will remain in effect (meaning this test can be used) for the duration of the COVID-19 declaration under Section 564(b)(1) of the Act, 21 U.S.C. section 360bbb-3(b)(1), unless the authorization is terminated or revoked.     Resp Syncytial Virus by PCR NEGATIVE NEGATIVE Final    Comment: (NOTE) Fact Sheet for Patients: BloggerCourse.com  Fact Sheet for Healthcare Providers: SeriousBroker.it  This test is not yet approved or cleared by the [place]  FDA and has been authorized for detection and/or diagnosis of SARS-CoV-2 by FDA under an Emergency Use Authorization (EUA). This EUA will remain in effect (meaning this test can be used) for the duration of the COVID-19 declaration under Section 564(b)(1) of the Act, 21 U.S.C. section 360bbb-3(b)(1), unless the authorization is terminated or revoked.  Performed at [facility], [address] [place], [place] [address]   MRSA Next Gen by PCR, Nasal     Status: None   Collection Time: 07/22/24  4:26 AM   Specimen: Urine, Clean Catch; Nasal Swab  Result Value Ref Range Status   MRSA by PCR Next Gen NOT DETECTED NOT DETECTED Final    Comment: (NOTE) The GeneXpert MRSA Assay (FDA approved for NASAL specimens only), is one component of a comprehensive MRSA colonization surveillance program. It is not intended to diagnose MRSA infection nor to guide or monitor treatment for MRSA infections. Test performance is not FDA approved in patients less than 21 years old. Performed at [facility], [address] [place], [place] [address]     Procedures and diagnostic studies:  CT HEAD WO CONTRAST ( ) Result Date: 07/22/2024 EXAM: CT HEAD WITHOUT CONTRAST 07/22/2024 03:19:55 AM TECHNIQUE: CT of the head was performed without the administration of intravenous contrast. Automated exposure control, iterative reconstruction, and/or weight based adjustment of the mA/kV was utilized to reduce the radiation dose to as low as reasonably achievable. COMPARISON: 07/21/2024 CLINICAL HISTORY: Subdural hematoma. FINDINGS: BRAIN AND VENTRICLES: 6 mm left posterior convexity acute subdural hematoma is unchanged. Old right cerebellar infarct. Moderate volume loss and chronic ischemic white matter changes. ORBITS: No acute abnormality.  SINUSES: No acute abnormality. SOFT TISSUES AND SKULL: No acute soft tissue abnormality. No skull fracture. IMPRESSION: 1. Unchanged 6 mm left posterior convexity acute subdural hematoma. 2. Old right cerebellar infarct. 3. Moderate volume loss and chronic ischemic white matter changes. Electronically signed by: Franky Stanford MD 07/22/2024 03:25 AM EDT RP Workstation: [id number]   CT CHEST ABDOMEN PELVIS WO CONTRAST Result Date: 07/21/2024 EXAM: CT CHEST, ABDOMEN AND PELVIS WITHOUT CONTRAST 07/21/2024 09:21:31 PM TECHNIQUE: CT of the chest, abdomen and pelvis was  performed without the administration of intravenous contrast. Multiplanar reformatted images are provided for review. Automated exposure control, iterative reconstruction, and/or weight based adjustment of the mA/kV was utilized to reduce the radiation dose to as low as reasonably achievable. COMPARISON: CT abdomen dated 03/03/24. CLINICAL HISTORY: Sepsis, hyperglycemia and weakness. Daughter endorses increased weakness. Taking all of insulin . CBG 452. Hx of DKA. Baseline able to walk with a walker, alert but unable to answer orientation questions. No dx of dementia. FINDINGS: Motion degraded images. CHEST: MEDIASTINUM: Heart and pericardium are unremarkable. The central airways are clear. Mild 3-vessel coronary atherosclerosis. Mild thoracic aortic atherosclerosis. THORACIC LYMPH NODES: No mediastinal, hilar or axillary lymphadenopathy. LUNGS AND PLEURA: Multifocal patchy right lung opacities, suspicious for pneumonia possibly on the basis of aspiration. No pleural effusion or pneumothorax. ABDOMEN AND PELVIS: LIVER: The liver is unremarkable. GALLBLADDER AND BILE DUCTS: Layering small gallstones (image 86), without associated inflammatory changes. No biliary ductal dilatation. SPLEEN: No acute abnormality. PANCREAS: No acute abnormality. ADRENAL GLANDS: No acute abnormality. KIDNEYS, URETERS AND BLADDER: 10.0 cm simple right renal cyst (image 67), benign (Bosniak 1). Per consensus, no follow-up is needed for simple Bosniak type 1 and 2 renal cysts, unless the patient has a malignancy history or risk factors. No stones in the kidneys or ureters. No hydronephrosis. No perinephric or periureteral stranding. Moderate nondependent gas in the bladder, correlate for a cystitis versus instrumentation. GI AND BOWEL: Stomach demonstrates no acute abnormality. There is no bowel obstruction. Appendix is not discretely visualized. REPRODUCTIVE ORGANS: Brachytherapy seeds in the prostate. PERITONEUM AND RETROPERITONEUM: No  ascites. No free air. VASCULATURE: Atherosclerotic calcifications of the abdominal aorta and branch vessels. ABDOMINAL AND PELVIS LYMPH NODES: No lymphadenopathy. REPRODUCTIVE ORGANS: No acute abnormality. BONES AND SOFT TISSUES: Mild degenerative changes of the thoracic spine. Moderate compression fracture deformity at L1 (sagittal image 61), new from prior and possibly acute, with minimal retropulsion. IMPRESSION: 1. Multifocal patchy right lung opacities, suspicious for pneumonia, possibly on the basis of aspiration. 2. Moderate compression fracture deformity at L1, new from prior and possibly acute, with minimal retropulsion. 3. Moderate nondependent gas in the bladder, correlate for cystitis versus instrumentation. Electronically signed by: Pinkie Pebbles MD 07/21/2024 09:33 PM EDT RP Workstation: [id number]   CT Head Wo Contrast Result Date: 07/21/2024 EXAM: CT HEAD WITHOUT CONTRAST 07/21/2024 09:21:31 PM TECHNIQUE: CT of the head was performed without the administration of intravenous contrast. Automated exposure control, iterative reconstruction, and/or weight based adjustment of the mA/kV was utilized to reduce the radiation dose to as low as reasonably achievable. COMPARISON: 01/17/2024 CLINICAL HISTORY: Mental status change, unknown cause. Hyperglycemia and weakness. Daughter endorses increased weakness. Taking all of insulin . CBG 452. Hx of DKA. Baseline able to walk with a walker, alert but unable to answer orientation questions. No dx of dementia. FINDINGS: BRAIN AND VENTRICLES: Global cortical atrophy. Subcortical and periventricular small vessel ischemic changes. No midline shift. ORBITS: No acute abnormality. SINUSES: No acute abnormality. SOFT TISSUES AND SKULL: Left posterior convexity subdural  hematoma, measuring up to 8 mm in thickness on coronal imaging, acute. Chronic opacification of the left mastoid air cells. IMPRESSION: 1. Acute left posterior convexity subdural hematoma, measuring up  to 8 mm, without midline shift. 2. Critical Value/emergent results were called by telephone at the time of interpretation on 07/21/2024 at 2128 hrs to provider Dr Dreama, who verbally acknowledged these results. Electronically signed by: Pinkie Pebbles MD 07/21/2024 09:30 PM EDT RP Workstation: [id number]   DG Chest Port 1 View Result Date: 07/21/2024 CLINICAL DATA:  Sepsis weakness EXAM: PORTABLE CHEST 1 VIEW COMPARISON:  01/17/2024 FINDINGS: Left lung grossly clear. Patchy ill-defined airspace opacities in the right mid to upper lung. Cardiomegaly with aortic atherosclerosis. No pleural effusion or pneumothorax IMPRESSION: Patchy ill-defined airspace opacities in the right mid to upper lung suspicious for pneumonia. Radiographic follow-up to resolution is recommended. Cardiomegaly Electronically Signed   By: Luke Bun M.D.   On: 07/21/2024 18:49               LOS: 1 day   Kendal Ghazarian  [facility]   Pager on [email or web address]. If 7PM-7AM, please contact night-coverage at [email or web address]     07/22/2024, 12:19 PM

## 2024-07-22 NOTE — Assessment & Plan Note (Signed)
Drain in place.

## 2024-07-23 DIAGNOSIS — R6520 Severe sepsis without septic shock: Secondary | ICD-10-CM | POA: Diagnosis not present

## 2024-07-23 DIAGNOSIS — A419 Sepsis, unspecified organism: Secondary | ICD-10-CM | POA: Diagnosis not present

## 2024-07-23 DIAGNOSIS — Z711 Person with feared health complaint in whom no diagnosis is made: Secondary | ICD-10-CM

## 2024-07-23 LAB — GLUCOSE, CAPILLARY
Glucose-Capillary: 111 mg/dL — ABNORMAL HIGH (ref 70–99)
Glucose-Capillary: 133 mg/dL — ABNORMAL HIGH (ref 70–99)
Glucose-Capillary: 138 mg/dL — ABNORMAL HIGH (ref 70–99)
Glucose-Capillary: 172 mg/dL — ABNORMAL HIGH (ref 70–99)
Glucose-Capillary: 226 mg/dL — ABNORMAL HIGH (ref 70–99)
Glucose-Capillary: 87 mg/dL (ref 70–99)

## 2024-07-23 LAB — CBC
HCT: 30.3 % — ABNORMAL LOW (ref 39.0–52.0)
Hemoglobin: 9.7 g/dL — ABNORMAL LOW (ref 13.0–17.0)
MCH: 24.9 pg — ABNORMAL LOW (ref 26.0–34.0)
MCHC: 32 g/dL (ref 30.0–36.0)
MCV: 77.9 fL — ABNORMAL LOW (ref 80.0–100.0)
Platelets: 374 K/uL (ref 150–400)
RBC: 3.89 MIL/uL — ABNORMAL LOW (ref 4.22–5.81)
RDW: 18.2 % — ABNORMAL HIGH (ref 11.5–15.5)
WBC: 13.7 K/uL — ABNORMAL HIGH (ref 4.0–10.5)
nRBC: 0 % (ref 0.0–0.2)

## 2024-07-23 LAB — URINE CULTURE: Culture: <10000 — AB

## 2024-07-23 LAB — RENAL FUNCTION PANEL
Albumin: 2 g/dL — ABNORMAL LOW (ref 3.5–5.0)
Anion gap: 12 (ref 5–15)
BUN: 13 mg/dL (ref 8–23)
CO2: 23 mmol/L (ref 22–32)
Calcium: 9.1 mg/dL (ref 8.9–10.3)
Chloride: 103 mmol/L (ref 98–111)
Creatinine, Ser: 1.19 mg/dL (ref 0.61–1.24)
GFR, Estimated: >60 mL/min (ref >60)
Glucose, Bld: 104 mg/dL — ABNORMAL HIGH (ref 70–99)
Phosphorus: 2.5 mg/dL (ref 2.5–4.6)
Potassium: 3.4 mmol/L — ABNORMAL LOW (ref 3.5–5.1)
Sodium: 138 mmol/L (ref 135–145)

## 2024-07-23 LAB — VITAMIN B12: Vitamin B-12: 196 pg/mL (ref 180–914)

## 2024-07-23 LAB — LEGIONELLA PNEUMOPHILA SEROGP 1 UR AG: L. pneumophila Serogp 1 Ur Ag: NEGATIVE

## 2024-07-23 LAB — MAGNESIUM: Magnesium: 1.6 mg/dL — ABNORMAL LOW (ref 1.7–2.4)

## 2024-07-23 MED ORDER — ADULT MULTIVITAMIN W/MINERALS CH
1.0000 | ORAL_TABLET | Freq: Every day | ORAL | Status: DC
  Administered 2024-07-23 – 2024-07-30 (×8): 1 via ORAL
  Filled 2024-07-23 (×8): qty 1

## 2024-07-23 MED ORDER — PANTOPRAZOLE SODIUM 40 MG PO TBEC
40.0000 mg | DELAYED_RELEASE_TABLET | Freq: Every day | ORAL | Status: DC
  Administered 2024-07-23 – 2024-07-28 (×5): 40 mg via ORAL
  Filled 2024-07-23 (×6): qty 1

## 2024-07-23 MED ORDER — INSULIN GLARGINE-YFGN 100 UNIT/ML ~~LOC~~ SOLN
7.0000 [IU] | Freq: Every day | SUBCUTANEOUS | Status: DC
  Administered 2024-07-23 – 2024-07-29 (×7): 7 [IU] via SUBCUTANEOUS
  Filled 2024-07-23 (×8): qty 0.07

## 2024-07-23 MED ORDER — CARBAMIDE PEROXIDE 6.5 % OT SOLN
5.0000 [drp] | Freq: Two times a day (BID) | OTIC | Status: AC
  Administered 2024-07-23 – 2024-07-25 (×6): 5 [drp] via OTIC
  Filled 2024-07-23: qty 15

## 2024-07-23 MED ORDER — ENSURE PLUS HIGH PROTEIN PO LIQD
237.0000 mL | Freq: Two times a day (BID) | ORAL | Status: DC
  Administered 2024-07-23 – 2024-07-30 (×14): 237 mL via ORAL

## 2024-07-23 MED ORDER — MAGNESIUM SULFATE 2 GM/50ML IV SOLN
2.0000 g | Freq: Once | INTRAVENOUS | Status: AC
  Administered 2024-07-23: 2 g via INTRAVENOUS
  Filled 2024-07-23: qty 50

## 2024-07-23 MED ORDER — POTASSIUM CHLORIDE 20 MEQ PO PACK
40.0000 meq | PACK | Freq: Once | ORAL | Status: AC
  Administered 2024-07-23: 40 meq via ORAL
  Filled 2024-07-23: qty 2

## 2024-07-23 MED ORDER — THIAMINE MONONITRATE 100 MG PO TABS
100.0000 mg | ORAL_TABLET | Freq: Every day | ORAL | Status: AC
  Administered 2024-07-23 – 2024-07-27 (×5): 100 mg via ORAL
  Filled 2024-07-23 (×5): qty 1

## 2024-07-23 NOTE — [place] FL2 (Signed)
 [place]  MEDICAID FL2 LEVEL OF CARE FORM     IDENTIFICATION  Patient Name: Andre Stout Birthdate: 11/25/1940 Sex: male Admission Date (Current Location): 07/21/2024  [facility] and [place] Number:  Producer, television/film/video and Address:  [facility], [address], [place], [place] [address]      Provider Number: [id number]  Attending Physician Name and Address:  Jens Durand, MD  Relative Name and Phone Number:       Current Level of Care: Hospital Recommended Level of Care: Skilled Nursing Facility Prior Approval Number:    Date Approved/Denied:   PASRR Number: [id number]  Discharge Plan: SNF    Current Diagnoses: Patient Active Problem List   Diagnosis Date Noted   L1 vertebral fracture (HCC) 07/22/2024   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   Severe sepsis (HCC) 07/21/2024   Aspiration pneumonia (HCC) 07/21/2024   Acute respiratory failure with hypoxia (HCC) 07/21/2024   CKD (chronic kidney disease) stage 3, GFR 30-59 ml/min (HCC) 07/21/2024   Subdural hematoma (HCC) 07/21/2024   Irritant contact dermatitis associated with stoma 03/13/2024   Persistent postoperative fistula 03/13/2024   GERD without esophagitis 03/05/2024   Uncontrolled type 2 diabetes mellitus with hyperglycemia, with long-term current use of insulin  (HCC) 03/05/2024   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   History of complete heart block 03/05/2024   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   Malnutrition of moderate degree 01/20/2024   Hyperlipidemia 01/17/2024   Leukocytosis 01/17/2024   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 (HCC) 01/17/2024   Acute encephalopathy 01/17/2024   fracture of distal diaphyseal metaphyseal junction of the 5th left metacarpal with dorsal angulation 01/17/2024   Cholecystostomy care (HCC) 01/17/2024   Migration of percutaneous cholecystostomy tube 08/18/2023   Hypertension associated with diabetes (HCC)  08/18/2023   History of stroke 08/18/2023   Mixed diabetic hyperlipidemia associated with type 2 diabetes mellitus (HCC) 08/18/2023   TIA (transient ischemic attack) 10/15/2022   Odynophagia 08/21/2022   Hyponatremia 08/20/2022   Obesity (BMI 30-39.9) 08/20/2022   Myocardial injury 08/20/2022   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   Seizure (HCC) 01/02/2022   Prolonged QT interval 01/02/2022   Syncope and collapse 10/09/2020   Prostate cancer (HCC) 03/25/2019   Hypophosphatemia 03/06/2016   Benign essential HTN 03/05/2016   AKI (acute kidney injury) (HCC) 03/04/2016    Orientation RESPIRATION BLADDER Height & Weight      (UTA)  Normal Incontinent Weight: 160 lb (72.6 kg) (from May 2025 records) Height:  5' (152.4 cm)  BEHAVIORAL SYMPTOMS/MOOD NEUROLOGICAL BOWEL NUTRITION STATUS      Continent Diet (Dysphagia 1 (Puree);Thin liquid)  AMBULATORY STATUS COMMUNICATION OF NEEDS Skin   Extensive Assist   Normal                       Personal Care Assistance Level of Assistance  Bathing, Feeding, Dressing Bathing Assistance: Maximum assistance Feeding assistance: Maximum assistance Dressing Assistance: Maximum assistance     Functional Limitations Info  Sight, Speech, Hearing Sight Info: Adequate Hearing Info: Adequate Speech Info: Impaired    SPECIAL CARE FACTORS FREQUENCY  PT (By licensed PT), OT (By licensed OT)                    Contractures Contractures Info: Not present    Additional Factors Info  Code Status Code Status Info: FULL CODE  Current Medications (07/23/2024):  This is the current hospital active medication list Current Facility-Administered Medications  Medication Dose Route Frequency Provider Last Rate Last Admin   acetaminophen  (TYLENOL ) tablet 650 mg  650 mg Oral Q6H PRN Doutova, Anastassia, MD       Or   acetaminophen  (TYLENOL ) suppository 650 mg  650 mg Rectal Q6H PRN Doutova, Anastassia, MD       albuterol   (PROVENTIL ) (2.5 MG/3ML) 0.083% nebulizer solution 2.5 mg  2.5 mg Nebulization Q2H PRN Doutova, Anastassia, MD       Ampicillin -Sulbactam (UNASYN ) 3 g in sodium chloride  0.9 % 100 mL IVPB  3 g Intravenous Q6H Pham, Minh Q, RPH-CPP 200 mL/hr at 07/23/24 0842 3 g at 07/23/24 0842   atorvastatin  (LIPITOR) tablet 40 mg  40 mg Oral Daily Doutova, Anastassia, MD   40 mg at 07/23/24 0846   carbamide peroxide (DEBROX) 6.5 % OTIC (EAR) solution 5 drop  5 drop Both EARS BID Jens Durand, MD   5 drop at 07/23/24 1306   dextrose  50 % solution 50 mL  50 mL Intravenous PRN Sundil, Subrina, MD   50 mL at 07/22/24 0446   feeding supplement (ENSURE PLUS HIGH PROTEIN) liquid 237 mL  237 mL Oral BID BM Jens Durand, MD   237 mL at 07/23/24 1306   fentaNYL  (SUBLIMAZE ) injection 12.5-50 mcg  12.5-50 mcg Intravenous Q2H PRN Doutova, Anastassia, MD       HYDROcodone -acetaminophen  (NORCO/VICODIN) 5-325 MG per tablet 1-2 tablet  1-2 tablet Oral Q4H PRN Doutova, Anastassia, MD       insulin  aspart (novoLOG ) injection 0-9 Units  0-9 Units Subcutaneous Q4H Doutova, Anastassia, MD   1 Units at 07/23/24 1138   insulin  glargine-yfgn (SEMGLEE ) injection 7 Units  7 Units Subcutaneous QHS Jens Durand, MD       levETIRAcetam  (KEPPRA ) tablet 500 mg  500 mg Oral BID Jens Durand, MD   500 mg at 07/22/24 1700   Or   levETIRAcetam  (KEPPRA ) undiluted injection 500 mg  500 mg Intravenous BID Jens Durand, MD   500 mg at 07/23/24 9157   multivitamin with minerals tablet 1 tablet  1 tablet Oral Daily Jens Durand, MD   1 tablet at 07/23/24 1138   pantoprazole  (PROTONIX ) EC tablet 40 mg  40 mg Oral Q supper Pham, Minh Q, RPH-CPP       thiamine  (VITAMIN B1) tablet 100 mg  100 mg Oral Daily Jens Durand, MD   100 mg at 07/23/24 1138     Discharge Medications: Please see discharge summary for a list of discharge medications.  Relevant Imaging Results:  Relevant Lab Results:   Additional Information SSN: [id number] [place], [place]

## 2024-07-23 NOTE — Inpatient Diabetes Management (Signed)
 Inpatient Diabetes Program Recommendations  AACE/ADA: New Consensus Statement on Inpatient Glycemic Control   Target Ranges:  Prepandial:   less than 140 mg/dL      Peak postprandial:   less than 180 mg/dL (1-2 hours)      Critically ill patients:  140 - 180 mg/dL    Latest Reference Range & Units 07/22/24 07:58 07/22/24 11:32 07/22/24 14:37 07/22/24 16:05 07/22/24 20:11 07/22/24 23:46 07/23/24 04:23 07/23/24 07:27  Glucose-Capillary 70 - 99 mg/dL 858 (H) 899 (H) 806 (H) 176 (H) 157 (H) 152 (H) 111 (H) 87    Review of Glycemic Control  Diabetes history: DM2 Outpatient Diabetes medications: 70/30 30 units QAM, 70/30 15-20 units QPM, FreeStyle Libre 3 CGM Current orders for Inpatient glycemic control: Semglee  10 units at bedtime, Novolog  0-9 units Q4H  Inpatient Diabetes Program Recommendations:    Insulin : Please consider decreasing Semglee  to 7 units at bedtime. If patient is eating well, may want to change CBGs and Novolog  correction to AC&HS.  Thanks, Earnie Gainer, RN, MSN, CDCES Diabetes Coordinator Inpatient Diabetes Program [phone] (Team Pager from 8am to 5pm)

## 2024-07-23 NOTE — Progress Notes (Signed)
 Initial Nutrition Assessment  DOCUMENTATION CODES:   Non-severe (moderate) malnutrition in context of chronic illness  INTERVENTION:  Daily thiamine  100mg  for 5 days, daily MVI w/ minerals supplementation for 10 days due to abnormal refeeding labs  Ensure Plus High Protein po BID, each supplement provides 350 kcal and 20 grams of protein.  Encourage adequate intake PO that helps meet pt's calorie and protein needs  Will continue to monitor SLP therapy sessions and recommendations made by SLP for diet advancement   NUTRITION DIAGNOSIS:   Moderate Malnutrition related to chronic illness as evidenced by mild fat depletion, moderate muscle depletion.  GOAL:   Patient will meet greater than or equal to 90% of their needs  MONITOR:   PO intake, Supplement acceptance  REASON FOR ASSESSMENT:   Consult Assessment of nutrition requirement/status  ASSESSMENT:   Pt with hx of type 2 diabetes, DKA, cholecystitis (s/p cholecystostomy tube 2023 and percutaneous biliary drain placement), and HTN. Pt admitted with AMS, weakness and hyperglycemia, diagnosed sepsis secondary to community acquired pna, aspiration pna, and acute UTI.  7/22 admitted 7/23 SLP eval, recommended DYS 1  Sepsis being controlled with antibiotic regimen. Pt receiving IV fluids to treat AKI. Pt presents with acute L subdural hematoma following fall last week, neuro following.  Pt lethargic at time of assessment. Pt unable to answer any questions and no family present at bedside during assessment. Unable to obtain diet hx at this time. Per chart review, pt recommended DYS 1 diet after SLP eval and is consuming on average 50% over 3 recorded meals. Nutrition focused physical exam shows mild fat depletions and moderate muscle depletions, indicative of malnutrition. Suspect malnutrition has been chronic since cholecystitis and biliary drain placement and progressed swallowing issues. Recommend ONS to aid in calorie and  protein intake throughout the day. Per chart review, pt appeared to be refeeding with low potassium and magnesium  labs requiring repletion, suspect pt has not been eating well PTA but no diet hx available at this time. Ordered MVI w/ minerals daily and thiamine  100mg  for 5 days and will monitor electrolyte labs until they stabilize.   Wt hx in chart shows pt's wt declining rapidly following cholecystitis in 07/2022 where he lost 15% body weight in 6 months which is clinically significant for that timeframe. Pt had been maintaining wt since January 2024 but recently has dropped further (6% in last 4 months) which is not clinically significant for time frame but in combination with previous weight loss may be concerning. Pt's ht in chart recorded as 5'0 but had previously been recorded as 5'5 back in January 2025 and all prior encounters. Do not suspect pt has shrunk 5 inches in 6 months, suspect BMI to be lower if updated height is recorded. Pt unable to verify ht at this time.   Average Meal Completion: DYS 1 diet 7/23-7/24: 50% average intake over 3 recorded meals  Medications reviewed and include:  Novolog  SSI q4 hr Semglee  10 units daily Protonix  Potassium chloride  40mEq Unasyn  Magnesium  sulfate IVPB 2g  Labs reviewed:  CBG x 24 hours: 87-193 mg/dL Potassium 3.4 Magnesium  1.6 A1C 10.7  Drains: Biliary tube cholecystostomy UOP x 24 hours  NUTRITION - FOCUSED PHYSICAL EXAM:  Flowsheet Row Most Recent Value  Orbital Region Mild depletion  Upper Arm Region Moderate depletion  Thoracic and Lumbar Region No depletion  Buccal Region Mild depletion  Temple Region Moderate depletion  Clavicle Bone Region Mild depletion  Clavicle and Acromion Bone Region Mild  depletion  Scapular Bone Region Moderate depletion  Dorsal Hand Unable to assess  [mittens]  Patellar Region Moderate depletion  Anterior Thigh Region Moderate depletion  Posterior Calf Region Unable to assess   Edema (RD Assessment) Unable to assess  [none BUE, unable to assess BLE]  Hair Reviewed  Eyes Unable to assess  [lethargic wouldn't open eyes]  Mouth Unable to assess  [lethargic wouldn't open mouth]  Skin Reviewed  Nails Unable to assess  [mittens]    Diet Order:   Diet Order             DIET - DYS 1 Room service appropriate? No; Fluid consistency: Thin  Diet effective now                   EDUCATION NEEDS:   No education needs have been identified at this time  Skin:  Skin Assessment: Reviewed RN Assessment  Last BM:  7/24 type 5  Height:   Ht Readings from Last 1 Encounters:  07/21/24 5' (1.524 m)    Weight:   Wt Readings from Last 1 Encounters:  07/21/24 72.6 kg    Ideal Body Weight:  61.8 kg (based on ht being 5'5)  BMI:  Body mass index is 31.25 kg/m.  Estimated Nutritional Needs:   Kcal:  1700-1900g  Protein:  75-90g  Fluid:  1.7-1.9L   Josette Glance, MS, RDN, LDN Clinical Dietitian I Please reach out via secure chat

## 2024-07-23 NOTE — Progress Notes (Signed)
 Speech Language Pathology Treatment: Dysphagia  Patient Details Name: Andre Stout MRN: [id number] DOB: 1940/05/01 Today's Date: 07/23/2024 Time: 1120-1140 SLP Time Calculation (min) (ACUTE ONLY): 20 min  Assessment / Plan / Recommendation Clinical Impression  Pt has tolerated meals with total assist. Ok with sips of thins and bites of puree, but cannot tolerate meds with water; resulted in oral holding and coughing. Needs lots of stimulation to stay attentive. Pt is very hard of hearing, left ear seems best. Cerumen seen in bilateral ears with otoscope. RN requested treatment from MD. No further SLP interventions needed  HPI HPI: Andre Stout is a 84 y.o. male Family states patient had a fall about a week ago few days ago started having more nausea and vomiting had increased depth and rate of respirations. CT head shows Acute left posterior convexity subdural hematoma, measuring up to 8 mm,  without midline shift. CT chest shows Multifocal patchy right lung opacities, suspicious for pneumonia possibly on  the basis of aspiration. 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 Sp percutaneous biliary drain      SLP Plan  All goals met          Recommendations  Diet recommendations: Dysphagia 1 (puree);Thin liquid Liquids provided via: Cup;Straw Medication Administration: Crushed with puree Supervision: Full supervision/cueing for compensatory strategies Compensations: Slow rate;Small sips/bites Postural Changes and/or Swallow Maneuvers: Seated upright 90 degrees                  Oral care BID           All goals met     Axel Frisk, Consuelo Fitch  07/23/2024, 3:11 PM

## 2024-07-23 NOTE — Progress Notes (Signed)
 Progress Note    Andre Stout  FMW:[id number] DOB: June 04, 1940  DOA: 07/21/2024 PCP: Jason Leita Repine, FNP      Brief Narrative:    Medical records reviewed and are as summarized below:  Andre Stout is a 84 y.o. 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 Family reported that patient had fallen about a week prior to admission.  Family noticed patient had developed nausea and vomiting few days prior to admission he was becoming weaker and confused.  Family brought him to the ED because they thought he might have another DKA..  In the ED, he was febrile with temperature up to 202.3 F, tachypneic with respiratory rate up to 25, heart rate 88, BP 107/68 and O2 sat 97% on 2 L of oxygen  Initial glucose on admission was 415.  Subsequently, patient developed hypoglycemia with glucose dropping to 61.  Lactic acid was 5.4 but subsequently came down to 1.6.  CT chest abdomen and pelvis suspicious for right lung pneumonia possibly aspiration.  Moderate compression fracture deformity at L1, moderate nondependent causing the blood concerning for cystitis versus instrumentation also noted on imaging.  CT head without contrast: IMPRESSION: 1. Acute left posterior convexity subdural hematoma, measuring up to 8 mm, without midline shift.   Assessment/Plan:   Principal Problem:   Severe sepsis (HCC) Active Problems:   Heart block AV complete (HCC)   Acute encephalopathy   Benign essential HTN   History of stroke   Hyperlipidemia   Prostate cancer (HCC)   Prolonged QT interval   Uncontrolled type 2 diabetes mellitus with hyperglycemia, with long-term current use of insulin  (HCC)   Mobitz type 1 second degree  atrioventricular block   Aspiration pneumonia (HCC)   Acute respiratory failure with hypoxia (HCC)   CKD (chronic kidney disease) stage 3, GFR 30-59 ml/min (HCC)   Subdural hematoma (HCC)   L1 vertebral fracture (HCC)   Biliary fistula    Body mass index is 31.25 kg/m.  (Class I obesity)    Severe sepsis secondary to community-acquired pneumonia, probable right aspiration pneumonia, acute UTI: Urine culture showed insignificant growth. Blood culture showed Staph capitis in an aerobic bottle only (1 out of 4 culture bottles).  This is likely a contaminant. Continue IV Unasyn .  IV vancomycin  has been discontinued. MRSA screen was negative.   Hypotension: BP has improved.  IV fluids has been discontinued.   Acute kidney injury: Resolved.  Creatinine down from 1.68-1.26-1.19.    Acute metabolic encephalopathy: He's still confused.  Continue supportive care. Suspected dementia: His daughter said he stays confused and has been like this for over 2 years and his confusion has actually been worsening.  She suspects he has dementia although he has not been formally diagnosed with it.   Acute left-sided subdural hematoma (8 mm) without midline shift: Repeat CT head showed stability of SDH, measuring 6 mm.  He has been evaluated by neurosurgery team. Conservative management for now.  Plavix  to be held for 7 to 10 days.   Prophylactic Keppra  500 mg twice daily for up to 7 days maximum. Outpatient follow-up with neurosurgeon. Recent fall prior to admission: PT and OT commended discharge to SNF   Type II DM with hyperglycemia, hypoglycemia: She was hyperglycemic on presentation with glucose of 415.  Glucose dropped to 61 on 07/21/2024 at 4:30 AM. Decrease Lantus  from 10 units to 7 units nightly. Use NovoLog  as needed for hyperglycemia. Adjust insulin  as needed.   Hypomagnesemia: Magnesium  is still low at 1.6.  Replete with IV magnesium  sulfate. Hypophosphatemia: Improved   Hypokalemia: Replete potassium and monitor level   Prolonged QTc interval (517): Replete electrolytes and monitor QTc.   L1 compression fracture: TLSO brace recommended.   Comorbidities include History of complete intermittent heart block followed by cardiologist, History of cholecystitis s/p cholecystostomy tube placement in 2023, s/p percutaneous biliary drain, hypertension, history of stroke (was on aspirin  and Plavix  prior to admission), hyperlipidemia, prostate cancer    Diet Order             DIET - DYS 1 Room service appropriate? No; Fluid consistency: Thin  Diet effective now                            Consultants: Neurosurgeon  Procedures: None    Medications:    atorvastatin   40 mg Oral Daily   carbamide peroxide  5 drop Both EARS BID   feeding supplement  237 mL Oral BID BM   insulin  aspart  0-9 Units Subcutaneous Q4H   insulin  glargine-yfgn  7 Units Subcutaneous QHS   levETIRAcetam   500 mg Oral BID   Or   levETIRAcetam   500 mg Intravenous BID   multivitamin with minerals  1 tablet Oral Daily   pantoprazole  (PROTONIX ) IV  40 mg Intravenous QHS   thiamine   100 mg Oral Daily   Continuous Infusions:  ampicillin -sulbactam (UNASYN ) IV 3 g (07/23/24 0842)     Anti-infectives (From admission, onward)    Start     Dose/Rate Route Frequency Ordered Stop   07/22/24 1446  vancomycin  variable dose per unstable renal function (pharmacist dosing)  Status:  Discontinued         Does not apply See admin instructions 07/22/24 1446 07/22/24 1600   07/22/24 0400  Ampicillin -Sulbactam (UNASYN ) 3 g in sodium chloride  0.9 % 100 mL IVPB        3 g 200 mL/hr over 30 Minutes Intravenous Every 6 hours 07/21/24 2359     07/22/24 0000  vancomycin  (VANCOREADY) IVPB 500 mg/100 mL        500 mg 100 mL/hr over 60 Minutes Intravenous  Once 07/21/24 2359 07/22/24 0122   07/21/24 1830  ceFEPIme  (MAXIPIME ) 2 g in sodium chloride  0.9 % 100 mL IVPB        2 g 200  mL/hr over 30 Minutes Intravenous  Once 07/21/24 1818 07/21/24 1939   07/21/24 1830  metroNIDAZOLE  (FLAGYL ) IVPB 500 mg        500 mg 100 mL/hr over 60 Minutes Intravenous  Once 07/21/24 1818 07/21/24 2055   07/21/24 1830  vancomycin  (VANCOCIN ) IVPB 1000 mg/200 mL premix        1,000 mg 200 mL/hr over 60 Minutes Intravenous  Once 07/21/24 1818 07/21/24 2140              Family Communication/Anticipated D/C date and plan/Code Status   DVT prophylaxis: SCDs Start: 07/22/24 0029     Code Status: Full Code  Family Communication: Plan discussed with Michaelle, daughter, over the phone. Disposition Plan: Plan to discharge to SNF   Status is: Inpatient Remains inpatient appropriate because: Severe sepsis       Subjective:   Interval events noted.  He is drowsy and  confused unable to provide any history.  Nurse tech was at the bedside.    Objective:    Vitals:   07/23/24 0145 07/23/24 0420 07/23/24 0726 07/23/24 1102  BP: 101/64 (!) 104/56 (!) 121/55 (!) 117/57  Pulse: 69 83 79 72  Resp: 20 16 16 16   Temp: 97.7 F (36.5 C) 98 F (36.7 C) 97.9 F (36.6 C) 97.9 F (36.6 C)  TempSrc: Oral Oral Oral Oral  SpO2: 92% 98% 97% 99%  Weight:      Height:       No data found.   Intake/Output Summary (Last 24 hours) at 07/23/2024 1220 Last data filed at 07/23/2024 0900 Gross per 24 hour  Intake 2656.61 ml  Output 676 ml  Net 1980.61 ml   Filed Weights   07/21/24 2343  Weight: 72.6 kg    Exam:   GEN: NAD SKIN: Warm and dry EYES: No pallor or icterus ENT: MMM CV: RRR PULM: CTA B ABD: soft, ND, NT, +BS CNS: Drowsy but arousable.  Confused.  Moves all extremities spontaneously EXT: No edema or tenderness      Data Reviewed:   I have personally reviewed following labs and imaging studies:  Labs: Labs show the following:   Basic Metabolic Panel: Recent Labs  Lab 07/21/24 1800 07/21/24 1837 07/22/24 0120 07/23/24 0312  NA 132* 134*  133* 134*  138  K 4.0 3.8  3.8 4.1 3.4*  CL 97* 98 102 103  CO2 22  --  21* 23  GLUCOSE 382* 390* 94 104*  BUN 19 20 18 13   CREATININE 1.68* 1.50* 1.26* 1.19  CALCIUM  9.4  --  8.7* 9.1  MG  --   --  1.5* 1.6*  PHOS  --   --  2.1* 2.5   GFR Estimated Creatinine Clearance: 39.3 mL/min (by C-G formula based on SCr of 1.19 mg/dL). Liver Function Tests: Recent Labs  Lab 07/21/24 1800 07/22/24 0120 07/23/24 0312  AST 19 24  --   ALT 8 7  --   ALKPHOS 92 69  --   BILITOT 1.1 1.4*  --   PROT 6.5 5.4*  --   ALBUMIN  2.2* 1.8* 2.0*   Recent Labs  Lab 07/21/24 1800  LIPASE 21   No results for input(s): AMMONIA in the last 168 hours. Coagulation profile Recent Labs  Lab 07/21/24 1800  INR 1.3*    CBC: Recent Labs  Lab 07/21/24 1800 07/21/24 1837 07/22/24 0120 07/22/24 0426 07/23/24 0312  WBC 19.8*  --  18.5* 16.9* 13.7*  NEUTROABS 18.0*  --   --   --   --   HGB 10.9* 11.6*  12.2* 9.1* 9.2* 9.7*  HCT 33.9* 34.0*  36.0* 29.0* 29.1* 30.3*  MCV 77.0*  --  78.8* 78.0* 77.9*  PLT 464*  --  355 362 374   Cardiac Enzymes: Recent Labs  Lab 07/22/24 0120  CKTOTAL 259   BNP (last 3 results) No results for input(s): PROBNP in the last 8760 hours. CBG: Recent Labs  Lab 07/22/24 2011 07/22/24 2346 07/23/24 0423 07/23/24 0727 07/23/24 1103  GLUCAP 157* 152* 111* 87 138*   D-Dimer: No results for input(s): DDIMER in the last 72 hours. Hgb A1c: Recent Labs    07/21/24 1800  HGBA1C 10.7*   Lipid Profile: No results for input(s): CHOL, HDL, LDLCALC, TRIG, CHOLHDL, LDLDIRECT in the last 72 hours. Thyroid  function studies: Recent Labs    07/22/24 0120  TSH 2.084   Anemia work  up: No results for input(s): VITAMINB12, FOLATE, FERRITIN, TIBC, IRON, RETICCTPCT in the last 72 hours. Sepsis Labs: Recent Labs  Lab 07/21/24 1800 07/21/24 1805 07/21/24 2000 07/22/24 0120 07/22/24 0122 07/22/24 0426 07/22/24 1600 07/23/24 0312   PROCALCITON  --   --   --  7.76  --   --   --   --   WBC 19.8*  --   --  18.5*  --  16.9*  --  13.7*  LATICACIDVEN  --    < > 4.0*  --  2.5* 1.6 1.8  --    < > = values in this interval not displayed.    Microbiology Recent Results (from the past 240 hours)  Culture, blood (Routine x 2)     Status: None (Preliminary result)   Collection Time: 07/21/24  6:00 PM   Specimen: BLOOD  Result Value Ref Range Status   Specimen Description BLOOD RIGHT ANTECUBITAL  Final   Special Requests   Final    BOTTLES DRAWN AEROBIC AND ANAEROBIC Blood Culture adequate volume   Culture   Final    NO GROWTH < 24 HOURS Performed at [facility], [address], [place], [place] [address]    Report Status PENDING  Incomplete  Culture, blood (Routine x 2)     Status: Abnormal (Preliminary result)   Collection Time: 07/21/24  6:10 PM   Specimen: BLOOD LEFT ARM  Result Value Ref Range Status   Specimen Description BLOOD LEFT ARM  Final   Special Requests   Final    BOTTLES DRAWN AEROBIC AND ANAEROBIC Blood Culture adequate volume   Culture  Setup Time   Final    GRAM POSITIVE COCCI IN CLUSTERS ANAEROBIC BOTTLE ONLY CRITICAL RESULT CALLED TO, READ BACK BY AND VERIFIED WITH: PHARMD LISA CURRAN ON 07/22/24 @ 2011 BY DRT    Culture (A)  Final    STAPHYLOCOCCUS CAPITIS THE SIGNIFICANCE OF ISOLATING THIS ORGANISM FROM A SINGLE SET OF BLOOD CULTURES WHEN MULTIPLE SETS ARE DRAWN IS UNCERTAIN. PLEASE NOTIFY THE MICROBIOLOGY DEPARTMENT WITHIN ONE WEEK IF SPECIATION AND SENSITIVITIES ARE REQUIRED. Performed at [facility], [address] [place], [place] [address]    Report Status PENDING  Incomplete  Blood Culture ID Panel (Reflexed)     Status: Abnormal   Collection Time: 07/21/24  6:10 PM  Result Value Ref Range Status   Enterococcus faecalis NOT DETECTED NOT DETECTED Final   Enterococcus Faecium NOT DETECTED NOT DETECTED Final   Listeria monocytogenes NOT DETECTED NOT DETECTED Final    Staphylococcus species DETECTED (A) NOT DETECTED Final    Comment: CRITICAL RESULT CALLED TO, READ BACK BY AND VERIFIED WITH: PHARMD LISA CURRAN ON 07/22/24 @ 2011 BY DRT    Staphylococcus aureus (BCID) NOT DETECTED NOT DETECTED Final   Staphylococcus epidermidis NOT DETECTED NOT DETECTED Final   Staphylococcus lugdunensis NOT DETECTED NOT DETECTED Final   Streptococcus species NOT DETECTED NOT DETECTED Final   Streptococcus agalactiae NOT DETECTED NOT DETECTED Final   Streptococcus pneumoniae NOT DETECTED NOT DETECTED Final   Streptococcus pyogenes NOT DETECTED NOT DETECTED Final   A.calcoaceticus-baumannii NOT DETECTED NOT DETECTED Final   Bacteroides fragilis NOT DETECTED NOT DETECTED Final   Enterobacterales NOT DETECTED NOT DETECTED Final   Enterobacter cloacae complex NOT DETECTED NOT DETECTED Final   Escherichia coli NOT DETECTED NOT DETECTED Final   Klebsiella aerogenes NOT DETECTED NOT DETECTED Final   Klebsiella oxytoca NOT DETECTED NOT DETECTED Final  Klebsiella pneumoniae NOT DETECTED NOT DETECTED Final   Proteus species NOT DETECTED NOT DETECTED Final   Salmonella species NOT DETECTED NOT DETECTED Final   Serratia marcescens NOT DETECTED NOT DETECTED Final   Haemophilus influenzae NOT DETECTED NOT DETECTED Final   Neisseria meningitidis NOT DETECTED NOT DETECTED Final   Pseudomonas aeruginosa NOT DETECTED NOT DETECTED Final   Stenotrophomonas maltophilia NOT DETECTED NOT DETECTED Final   Candida albicans NOT DETECTED NOT DETECTED Final   Candida auris NOT DETECTED NOT DETECTED Final   Candida glabrata NOT DETECTED NOT DETECTED Final   Candida krusei NOT DETECTED NOT DETECTED Final   Candida parapsilosis NOT DETECTED NOT DETECTED Final   Candida tropicalis NOT DETECTED NOT DETECTED Final   Cryptococcus neoformans/gattii NOT DETECTED NOT DETECTED Final    Comment: Performed at [facility], [address], [place], [place] [address]  Resp panel by RT-PCR (RSV, Flu  A&B, Covid) Anterior Nasal Swab     Status: None   Collection Time: 07/21/24  6:17 PM   Specimen: Anterior Nasal Swab  Result Value Ref Range Status   SARS Coronavirus 2 by RT PCR NEGATIVE NEGATIVE Final   Influenza A by PCR NEGATIVE NEGATIVE Final   Influenza B by PCR NEGATIVE NEGATIVE Final    Comment: (NOTE) The Xpert Xpress SARS-CoV-2/FLU/RSV plus assay is intended as an aid in the diagnosis of influenza from Nasopharyngeal swab specimens and should not be used as a sole basis for treatment. Nasal washings and aspirates are unacceptable for Xpert Xpress SARS-CoV-2/FLU/RSV testing.  Fact Sheet for Patients: BloggerCourse.com  Fact Sheet for Healthcare Providers: SeriousBroker.it  This test is not yet approved or cleared by the [place]  FDA and has been authorized for detection and/or diagnosis of SARS-CoV-2 by FDA under an Emergency Use Authorization (EUA). This EUA will remain in effect (meaning this test can be used) for the duration of the COVID-19 declaration under Section 564(b)(1) of the Act, 21 U.S.C. section 360bbb-3(b)(1), unless the authorization is terminated or revoked.     Resp Syncytial Virus by PCR NEGATIVE NEGATIVE Final    Comment: (NOTE) Fact Sheet for Patients: BloggerCourse.com  Fact Sheet for Healthcare Providers: SeriousBroker.it  This test is not yet approved or cleared by the [place]  FDA and has been authorized for detection and/or diagnosis of SARS-CoV-2 by FDA under an Emergency Use Authorization (EUA). This EUA will remain in effect (meaning this test can be used) for the duration of the COVID-19 declaration under Section 564(b)(1) of the Act, 21 U.S.C. section 360bbb-3(b)(1), unless the authorization is terminated or revoked.  Performed at [facility] [facility], [address], [place], [place] [address]   Urine Culture (for pregnant,  neutropenic or urologic patients or patients with an indwelling urinary catheter)     Status: Abnormal   Collection Time: 07/22/24  4:26 AM   Specimen: Urine, Clean Catch  Result Value Ref Range Status   Specimen Description URINE, CLEAN CATCH  Final   Special Requests NONE  Final   Culture (A)  Final    <10,000 COLONIES/mL INSIGNIFICANT GROWTH Performed at [facility], [address] [place], [place] [address]    Report Status 07/23/2024 FINAL  Final  MRSA Next Gen by PCR, Nasal     Status: None   Collection Time: 07/22/24  4:26 AM   Specimen: Urine, Clean Catch; Nasal Swab  Result Value Ref Range Status   MRSA by PCR Next Gen NOT DETECTED NOT DETECTED Final  Comment: (NOTE) The GeneXpert MRSA Assay (FDA approved for NASAL specimens only), is one component of a comprehensive MRSA colonization surveillance program. It is not intended to diagnose MRSA infection nor to guide or monitor treatment for MRSA infections. Test performance is not FDA approved in patients less than 37 years old. Performed at [facility], [address], [place], [place] [address]     Procedures and diagnostic studies:  CT HEAD WO CONTRAST ( ) Result Date: 07/22/2024 EXAM: CT HEAD WITHOUT CONTRAST 07/22/2024 03:19:55 AM TECHNIQUE: CT of the head was performed without the administration of intravenous contrast. Automated exposure control, iterative reconstruction, and/or weight based adjustment of the mA/kV was utilized to reduce the radiation dose to as low as reasonably achievable. COMPARISON: 07/21/2024 CLINICAL HISTORY: Subdural hematoma. FINDINGS: BRAIN AND VENTRICLES: 6 mm left posterior convexity acute subdural hematoma is unchanged. Old right cerebellar infarct. Moderate volume loss and chronic ischemic white matter changes. ORBITS: No acute abnormality. SINUSES: No acute abnormality. SOFT TISSUES AND SKULL: No acute soft tissue abnormality. No skull fracture. IMPRESSION: 1. Unchanged 6  mm left posterior convexity acute subdural hematoma. 2. Old right cerebellar infarct. 3. Moderate volume loss and chronic ischemic white matter changes. Electronically signed by: Franky Stanford MD 07/22/2024 03:25 AM EDT RP Workstation: [id number]   CT CHEST ABDOMEN PELVIS WO CONTRAST Result Date: 07/21/2024 EXAM: CT CHEST, ABDOMEN AND PELVIS WITHOUT CONTRAST 07/21/2024 09:21:31 PM TECHNIQUE: CT of the chest, abdomen and pelvis was performed without the administration of intravenous contrast. Multiplanar reformatted images are provided for review. Automated exposure control, iterative reconstruction, and/or weight based adjustment of the mA/kV was utilized to reduce the radiation dose to as low as reasonably achievable. COMPARISON: CT abdomen dated 03/03/24. CLINICAL HISTORY: Sepsis, hyperglycemia and weakness. Daughter endorses increased weakness. Taking all of insulin . CBG 452. Hx of DKA. Baseline able to walk with a walker, alert but unable to answer orientation questions. No dx of dementia. FINDINGS: Motion degraded images. CHEST: MEDIASTINUM: Heart and pericardium are unremarkable. The central airways are clear. Mild 3-vessel coronary atherosclerosis. Mild thoracic aortic atherosclerosis. THORACIC LYMPH NODES: No mediastinal, hilar or axillary lymphadenopathy. LUNGS AND PLEURA: Multifocal patchy right lung opacities, suspicious for pneumonia possibly on the basis of aspiration. No pleural effusion or pneumothorax. ABDOMEN AND PELVIS: LIVER: The liver is unremarkable. GALLBLADDER AND BILE DUCTS: Layering small gallstones (image 86), without associated inflammatory changes. No biliary ductal dilatation. SPLEEN: No acute abnormality. PANCREAS: No acute abnormality. ADRENAL GLANDS: No acute abnormality. KIDNEYS, URETERS AND BLADDER: 10.0 cm simple right renal cyst (image 67), benign (Bosniak 1). Per consensus, no follow-up is needed for simple Bosniak type 1 and 2 renal cysts, unless the patient has a malignancy  history or risk factors. No stones in the kidneys or ureters. No hydronephrosis. No perinephric or periureteral stranding. Moderate nondependent gas in the bladder, correlate for a cystitis versus instrumentation. GI AND BOWEL: Stomach demonstrates no acute abnormality. There is no bowel obstruction. Appendix is not discretely visualized. REPRODUCTIVE ORGANS: Brachytherapy seeds in the prostate. PERITONEUM AND RETROPERITONEUM: No ascites. No free air. VASCULATURE: Atherosclerotic calcifications of the abdominal aorta and branch vessels. ABDOMINAL AND PELVIS LYMPH NODES: No lymphadenopathy. REPRODUCTIVE ORGANS: No acute abnormality. BONES AND SOFT TISSUES: Mild degenerative changes of the thoracic spine. Moderate compression fracture deformity at L1 (sagittal image 61), new from prior and possibly acute, with minimal retropulsion. IMPRESSION: 1. Multifocal patchy right lung opacities, suspicious for pneumonia, possibly on the basis of aspiration. 2. Moderate compression fracture deformity at L1,  new from prior and possibly acute, with minimal retropulsion. 3. Moderate nondependent gas in the bladder, correlate for cystitis versus instrumentation. Electronically signed by: Pinkie Pebbles MD 07/21/2024 09:33 PM EDT RP Workstation: [id number]   CT Head Wo Contrast Result Date: 07/21/2024 EXAM: CT HEAD WITHOUT CONTRAST 07/21/2024 09:21:31 PM TECHNIQUE: CT of the head was performed without the administration of intravenous contrast. Automated exposure control, iterative reconstruction, and/or weight based adjustment of the mA/kV was utilized to reduce the radiation dose to as low as reasonably achievable. COMPARISON: 01/17/2024 CLINICAL HISTORY: Mental status change, unknown cause. Hyperglycemia and weakness. Daughter endorses increased weakness. Taking all of insulin . CBG 452. Hx of DKA. Baseline able to walk with a walker, alert but unable to answer orientation questions. No dx of dementia. FINDINGS: BRAIN AND  VENTRICLES: Global cortical atrophy. Subcortical and periventricular small vessel ischemic changes. No midline shift. ORBITS: No acute abnormality. SINUSES: No acute abnormality. SOFT TISSUES AND SKULL: Left posterior convexity subdural hematoma, measuring up to 8 mm in thickness on coronal imaging, acute. Chronic opacification of the left mastoid air cells. IMPRESSION: 1. Acute left posterior convexity subdural hematoma, measuring up to 8 mm, without midline shift. 2. Critical Value/emergent results were called by telephone at the time of interpretation on 07/21/2024 at 2128 hrs to provider Dr Dreama, who verbally acknowledged these results. Electronically signed by: Pinkie Pebbles MD 07/21/2024 09:30 PM EDT RP Workstation: [id number]   DG Chest Port 1 View Result Date: 07/21/2024 CLINICAL DATA:  Sepsis weakness EXAM: PORTABLE CHEST 1 VIEW COMPARISON:  01/17/2024 FINDINGS: Left lung grossly clear. Patchy ill-defined airspace opacities in the right mid to upper lung. Cardiomegaly with aortic atherosclerosis. No pleural effusion or pneumothorax IMPRESSION: Patchy ill-defined airspace opacities in the right mid to upper lung suspicious for pneumonia. Radiographic follow-up to resolution is recommended. Cardiomegaly Electronically Signed   By: Luke Bun M.D.   On: 07/21/2024 18:49               LOS: 2 days   Omar Orrego  [facility]   Pager on [email or web address]. If 7PM-7AM, please contact night-coverage at [email or web address]     07/23/2024, 12:20 PM

## 2024-07-23 NOTE — Progress Notes (Signed)
 Daily Progress Note   Patient Name: Andre Stout       Date: 07/23/2024 DOB: 1940/09/14  Age: 84 y.o. MRN#: [id number] Attending Physician: Jens Durand, MD Primary Care Physician: Jason Leita Repine, FNP Admit Date: 07/21/2024  Reason for Consultation/Follow-up: {Reason for Consult:23484}  Subjective: I have reviewed medical records including EPIC notes, MAR, any available advanced directives as necessary, and labs. Received report from primary RN - ***  All questions and concerns addressed. Encouraged to call with questions and/or concerns. PMT card provided.  Length of Stay: 2  Current Medications: Scheduled Meds:   atorvastatin   40 mg Oral Daily   insulin  aspart  0-9 Units Subcutaneous Q4H   insulin  glargine-yfgn  10 Units Subcutaneous QHS   levETIRAcetam   500 mg Oral BID   Or   levETIRAcetam   500 mg Intravenous BID   multivitamin with minerals  1 tablet Oral Daily   pantoprazole  (PROTONIX ) IV  40 mg Intravenous QHS   thiamine   100 mg Oral Daily    Continuous Infusions:  ampicillin -sulbactam (UNASYN ) IV 3 g (07/23/24 0842)   magnesium  sulfate bolus IVPB 2 g (07/23/24 0956)    PRN Meds: acetaminophen  **OR** acetaminophen , albuterol , dextrose , fentaNYL  (SUBLIMAZE ) injection, HYDROcodone -acetaminophen   Physical Exam          Vital Signs: BP (!) 121/55 (BP Location: Right Arm)   Pulse 79   Temp 97.9 F (36.6 C) (Oral)   Resp 16   Ht 5' (1.524 m)   Wt 72.6 kg Comment: from May 2025 records  SpO2 97%   BMI 31.25 kg/m  SpO2: SpO2: 97 % O2 Device: O2 Device: Room Air O2 Flow Rate: O2 Flow Rate (L/min): 3 L/min  Intake/output summary:  Intake/Output Summary (Last 24 hours) at 07/23/2024 1009 Last data filed at 07/23/2024 0900 Gross per 24 hour  Intake 2656.61  ml  Output 676 ml  Net 1980.61 ml   LBM: Last BM Date : 07/22/24 Baseline Weight: Weight: 72.6 kg (from May 2025 records) Most recent weight: Weight: 72.6 kg (from May 2025 records)       Palliative Assessment/Data:      Patient Active Problem List   Diagnosis Date Noted   L1 vertebral fracture (HCC) 07/22/2024   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   Severe sepsis (HCC) 07/21/2024   Aspiration pneumonia (HCC) 07/21/2024  Acute respiratory failure with hypoxia (HCC) 07/21/2024   CKD (chronic kidney disease) stage 3, GFR 30-59 ml/min (HCC) 07/21/2024   Subdural hematoma (HCC) 07/21/2024   Irritant contact dermatitis associated with stoma 03/13/2024   Persistent postoperative fistula 03/13/2024   GERD without esophagitis 03/05/2024   Uncontrolled type 2 diabetes mellitus with hyperglycemia, with long-term current use of insulin  (HCC) 03/05/2024   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   History of complete heart block 03/05/2024   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   Malnutrition of moderate degree 01/20/2024   Hyperlipidemia 01/17/2024   Leukocytosis 01/17/2024   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 (HCC) 01/17/2024   Acute encephalopathy 01/17/2024   fracture of distal diaphyseal metaphyseal junction of the 5th left metacarpal with dorsal angulation 01/17/2024   Cholecystostomy care (HCC) 01/17/2024   Migration of percutaneous cholecystostomy tube 08/18/2023   Hypertension associated with diabetes (HCC) 08/18/2023   History of stroke 08/18/2023   Mixed diabetic hyperlipidemia associated with type 2 diabetes mellitus (HCC) 08/18/2023   TIA (transient ischemic attack) 10/15/2022   Odynophagia 08/21/2022   Hyponatremia 08/20/2022   Obesity (BMI 30-39.9) 08/20/2022   Myocardial injury 08/20/2022   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   Seizure (HCC) 01/02/2022   Prolonged QT interval 01/02/2022   Syncope and collapse 10/09/2020    Prostate cancer (HCC) 03/25/2019   Hypophosphatemia 03/06/2016   Benign essential HTN 03/05/2016   AKI (acute kidney injury) (HCC) 03/04/2016    Palliative Care Assessment & Plan   Patient Profile: ***  Assessment: Principal Problem:   Severe sepsis (HCC) Active Problems:   Benign essential HTN   Prostate cancer (HCC)   Prolonged QT interval   Heart block AV complete (HCC)   History of stroke   Hyperlipidemia   Acute encephalopathy   Uncontrolled type 2 diabetes mellitus with hyperglycemia, with long-term current use of insulin  (HCC)   Mobitz type 1 second degree atrioventricular block   Aspiration pneumonia (HCC)   Acute respiratory failure with hypoxia (HCC)   CKD (chronic kidney disease) stage 3, GFR 30-59 ml/min (HCC)   Subdural hematoma (HCC)   L1 vertebral fracture (HCC)   Biliary fistula   Recommendations/Plan: Continue full code/full scope care Unable to speak with daughter today - left VM PMT will continue to follow and support holistically  Goals of Care and Additional Recommendations: Limitations on Scope of Treatment: {Recommended Scope and Preferences:21019}  Code Status:    Code Status Orders  (From admission, onward)           Start     Ordered   07/21/24 2346  Full code  Continuous       Question:  By:  Answer:  Consent: discussion documented in EHR   07/21/24 2346           Code Status History     Date Active Date Inactive Code Status Order ID Comments User Context   07/21/2024 2327 07/21/2024 2346 Full Code [id number]  Arloa Lavanda RIGGERS ED   03/03/2024 1327 03/05/2024 2334 Full Code [id number]  Zella Katha HERO, MD ED   01/17/2024 1823 01/23/2024 0105 Full Code [id number]  Waddell Rake, MD ED   08/18/2023 1914 08/20/2023 2211 Full Code [id number]  Tobie Jorie SAUNDERS, MD Inpatient   10/15/2022 0335 10/17/2022 0001 Full Code [id number]  Shona Terry SAILOR, DO ED   08/14/2022 1533 08/28/2022 1830 Full Code [id number]  Laurita Cort DASEN, MD ED    08/01/2022 0025 08/05/2022 0021 Full  Code [id number]  Laveda Roosevelt, MD ED   01/02/2022 2237 01/06/2022 0138 Full Code [id number]  Seena Marsa NOVAK, MD ED   10/09/2020 1827 10/10/2020 2259 Full Code [id number]  Sim Emery CROME, MD ED   03/04/2016 1607 03/07/2016 2149 Full Code [id number]  Mavis Ritchie BROCKS, MD Inpatient       Prognosis:  {Palliative Care Prognosis:23504}  Discharge Planning: {Palliative dispostion:23505}  Care plan was discussed with ***  Thank you for allowing the Palliative Medicine Team to assist in the care of this patient.   Time In: *** Time Out: *** Total Time *** Prolonged Time Billed  {YES WN:77650}       Jeoffrey CHRISTELLA Sharps, NP  Please contact Palliative Medicine Team phone at [phone] for questions and concerns.   *Portions of this note are a verbal dictation therefore any spelling and/or grammatical errors are due to the Dragon Medical One system interpretation.

## 2024-07-23 NOTE — TOC Initial Note (Signed)
 Transition of Care [facility]) - Initial/Assessment Note    Patient Details  Name: Andre Stout MRN: [id number] Date of Birth: 1940-12-29  Transition of Care [facility] CM/SW Contact:    Gwenn Julien [place], [place] Phone Number: 07/23/2024, 2:32 PM  Clinical Narrative: Spoke to pt's dtr Glennie Amble re PT/OT rec for SNF. Dtr reports she is considering this option vs retun home with HH. Pt active with [facility]. Pt's dtr provides care, reports she is a Engineer, civil (consulting). Reviewed SNF placement process and answered questions. Will begin SNF search and f/u with pt's dtr to confirm dc plan.   Julien Gwenn, MSW, LCSW [phone] (coverage)                    Expected Discharge Plan: Skilled Nursing Facility Barriers to Discharge: Continued Medical Work up, SNF Pending bed offer, Insurance Authorization   Patient Goals and CMS Choice     Choice offered to / list presented to : Adult Children [place] ownership interest in [facility].provided to:: Adult Children    Expected Discharge Plan and Services     Post Acute Care Choice: Skilled Nursing Facility Living arrangements for the past 2 months: Single Family Home                             HH Agency: [facility]        Prior Living Arrangements/Services Living arrangements for the past 2 months: Single Family Home Lives with:: Adult Children Patient language and need for interpreter reviewed:: Yes        Need for Family Participation in Patient Care: Yes (Comment) Care giver support system in place?: Yes (comment)   Criminal Activity/Legal Involvement Pertinent to Current Situation/Hospitalization: No - Comment as needed  Activities of Daily Living      Permission Sought/Granted Permission sought to share information with : Oceanographer granted to share information with : Yes, Verbal Permission Granted              Emotional Assessment       Orientation: :  Fluctuating Orientation (Suspected and/or reported Sundowners) Alcohol / Substance Use: Not Applicable Psych Involvement: No (comment)  Admission diagnosis:  Subdural hematoma (HCC) [S06.5XAA] Sepsis due to pneumonia (HCC) [J18.9, A41.9] Compression fracture of L1 vertebra, initial encounter (HCC) [S32.010A] Patient Active Problem List   Diagnosis Date Noted   L1 vertebral fracture (HCC) 07/22/2024   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   Severe sepsis (HCC) 07/21/2024   Aspiration pneumonia (HCC) 07/21/2024   Acute respiratory failure with hypoxia (HCC) 07/21/2024   CKD (chronic kidney disease) stage 3, GFR 30-59 ml/min (HCC) 07/21/2024   Subdural hematoma (HCC) 07/21/2024   Irritant contact dermatitis associated with stoma 03/13/2024   Persistent postoperative fistula 03/13/2024   GERD without esophagitis 03/05/2024   Uncontrolled type 2 diabetes mellitus with hyperglycemia, with long-term current use of insulin  (HCC) 03/05/2024   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   History of complete heart block 03/05/2024   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   Malnutrition of moderate degree 01/20/2024   Hyperlipidemia 01/17/2024   Leukocytosis 01/17/2024   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 (HCC) 01/17/2024   Acute encephalopathy 01/17/2024   fracture of distal diaphyseal metaphyseal junction of the 5th left metacarpal with dorsal angulation 01/17/2024   Cholecystostomy care (HCC) 01/17/2024   Migration of percutaneous cholecystostomy tube 08/18/2023   Hypertension associated with diabetes (  HCC) 08/18/2023   History of stroke 08/18/2023   Mixed diabetic hyperlipidemia associated with type 2 diabetes mellitus (HCC) 08/18/2023   TIA (transient ischemic attack) 10/15/2022   Odynophagia 08/21/2022   Hyponatremia 08/20/2022   Obesity (BMI 30-39.9) 08/20/2022   Myocardial injury 08/20/2022   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   Seizure (HCC)  01/02/2022   Prolonged QT interval 01/02/2022   Syncope and collapse 10/09/2020   Prostate cancer (HCC) 03/25/2019   Hypophosphatemia 03/06/2016   Benign essential HTN 03/05/2016   AKI (acute kidney injury) (HCC) 03/04/2016   PCP:  Jason Leita Repine, FNP Pharmacy:   [id number] - [place], [place] - [address] [place] [address] Phone: [phone] Fax: [phone]  [facility] [address] [place] [address] Phone: [phone] Fax: [phone]  [facility] - [place], [place] - [address] [facility] [address] [place] [address] Phone: [phone] Fax: [phone]  [id number] - [place], [place] - [address] AT [facility] [address] [place] [address] Phone: [phone] Fax: [phone]  [id number] - [place], [place] - [address] AT [facility] OF [address] [place] [address] Phone: [phone] Fax: [phone]     Social Drivers of Health (SDOH) Social History: SDOH Screenings   Food Insecurity: Patient Unable To Answer (07/23/2024)  Housing: Unknown (07/23/2024)  Transportation Needs: Patient Unable To Answer (07/23/2024)  Utilities: Patient Unable To Answer (07/23/2024)  Alcohol Screen: Low Risk  (07/24/2023)  Depression (PHQ2-9): Low Risk  (07/24/2023)  Financial Resource Strain: Low Risk  (07/24/2023)  Physical Activity: Inactive (07/24/2023)  Social Connections: Patient Unable To Answer (07/23/2024)  Stress: No Stress Concern Present (07/24/2023)  Tobacco Use: Medium Risk (05/07/2024)  Health Literacy: Adequate Health Literacy (07/24/2023)   SDOH Interventions:     Readmission Risk Interventions    08/15/2022    9:25 AM  Readmission Risk Prevention Plan  Transportation Screening Complete  PCP or Specialist Appt within  5-7 Days Complete  Home Care Screening Complete  Medication Review (RN CM) Complete

## 2024-07-24 DIAGNOSIS — R6520 Severe sepsis without septic shock: Secondary | ICD-10-CM | POA: Diagnosis not present

## 2024-07-24 DIAGNOSIS — A419 Sepsis, unspecified organism: Secondary | ICD-10-CM | POA: Diagnosis not present

## 2024-07-24 LAB — BLOOD CULTURE ID PANEL (REFLEXED) - BCID2

## 2024-07-24 LAB — GLUCOSE, CAPILLARY
Glucose-Capillary: 125 mg/dL — ABNORMAL HIGH (ref 70–99)
Glucose-Capillary: 168 mg/dL — ABNORMAL HIGH (ref 70–99)
Glucose-Capillary: 187 mg/dL — ABNORMAL HIGH (ref 70–99)
Glucose-Capillary: 195 mg/dL — ABNORMAL HIGH (ref 70–99)
Glucose-Capillary: 200 mg/dL — ABNORMAL HIGH (ref 70–99)
Glucose-Capillary: 258 mg/dL — ABNORMAL HIGH (ref 70–99)

## 2024-07-24 LAB — RENAL FUNCTION PANEL
Albumin: 1.8 g/dL — ABNORMAL LOW (ref 3.5–5.0)
Anion gap: 10 (ref 5–15)
BUN: 17 mg/dL (ref 8–23)
CO2: 26 mmol/L (ref 22–32)
Calcium: 8.8 mg/dL — ABNORMAL LOW (ref 8.9–10.3)
Chloride: 104 mmol/L (ref 98–111)
Creatinine, Ser: 1.28 mg/dL — ABNORMAL HIGH (ref 0.61–1.24)
GFR, Estimated: 56 mL/min — ABNORMAL LOW (ref >60)
Glucose, Bld: 121 mg/dL — ABNORMAL HIGH (ref 70–99)
Phosphorus: 3 mg/dL (ref 2.5–4.6)
Potassium: 3.8 mmol/L (ref 3.5–5.1)
Sodium: 140 mmol/L (ref 135–145)

## 2024-07-24 LAB — CULTURE, BLOOD (ROUTINE X 2): Special Requests: ADEQUATE

## 2024-07-24 LAB — MAGNESIUM: Magnesium: 1.8 mg/dL (ref 1.7–2.4)

## 2024-07-24 LAB — CBC
HCT: 28.7 % — ABNORMAL LOW (ref 39.0–52.0)
Hemoglobin: 9.1 g/dL — ABNORMAL LOW (ref 13.0–17.0)
MCH: 24.7 pg — ABNORMAL LOW (ref 26.0–34.0)
MCHC: 31.7 g/dL (ref 30.0–36.0)
MCV: 77.8 fL — ABNORMAL LOW (ref 80.0–100.0)
Platelets: 391 K/uL (ref 150–400)
RBC: 3.69 MIL/uL — ABNORMAL LOW (ref 4.22–5.81)
RDW: 18.5 % — ABNORMAL HIGH (ref 11.5–15.5)
WBC: 10.4 K/uL (ref 4.0–10.5)
nRBC: 0 % (ref 0.0–0.2)

## 2024-07-24 MED ORDER — SODIUM CHLORIDE 0.9 % IV SOLN
2.0000 g | INTRAVENOUS | Status: DC
  Administered 2024-07-24 – 2024-07-27 (×4): 2 g via INTRAVENOUS
  Filled 2024-07-24 (×4): qty 20

## 2024-07-24 NOTE — Progress Notes (Signed)
 Physical Therapy Treatment Patient Details Name: Andre Stout MRN: [id number] DOB: May 03, 1940 Today's Date: 07/24/2024   History of Present Illness 84 y.o. male [facility]21[facility]24 for generalized fatigue and weakness with a fall one week prior. CT head showed acute L posterior convexity SDH. Pt also with acute encephalopathy and sepsis. CT chest showed moderate compression fx at L1. PMH: AV block, hypertension, type 2 diabetes, prostate cancer, glaucoma, HTNm, Cholecystitis s/p cholecystostomy in 07/2022.   PT Comments  Pt agreeable to PT/OT session to progress mobility. Pt had increased difficulty following commands with multimodal cues likely due to being very HOH. Required ModAx2 to MaxAx2 for bed mobility. Upon sitting on EOB, pt had a slight anterior lean corrected with CGA. Pt was able to stand with MaxAx2 and [facility] for <2 minutes for totalA pericare. Continue to recommend <3hrs post acute rehab to reduce caregiver burden and improve quality of life. Acute PT to follow.     If plan is discharge home, recommend the following: A lot of help with walking and/or transfers;A lot of help with bathing/dressing/bathroom;Assistance with cooking/housework;Direct supervision/assist for medications management;Direct supervision/assist for financial management;Assist for transportation;Help with stairs or ramp for entrance   Can travel by private vehicle     No  Equipment Recommendations  Wheelchair (measurements PT);Wheelchair cushion (measurements PT);BSC/3in1;Hospital bed;Hoyer lift;Rolling walker (2 wheels);Other (comment) (unclear what DME pt owns)       Precautions / Restrictions Precautions Precautions: Fall;Back Precaution Booklet Issued: No Recall of Precautions/Restrictions: Impaired Precaution/Restrictions Comments: SBP <160 Required Braces or Orthoses: Spinal Brace Spinal Brace: Thoracolumbosacral orthotic;Applied in sitting position Restrictions Weight Bearing Restrictions Per  Provider Order: No     Mobility  Bed Mobility Overal bed mobility: Needs Assistance Bed Mobility: Rolling, Sidelying to Sit, Sit to Sidelying Rolling: Mod assist Sidelying to sit: Mod assist, +2 for physical assistance, +2 for safety/equipment    Sit to sidelying: Max assist, +2 for physical assistance, +2 for safety/equipment General bed mobility comments: Pt not able to interpret cues, some assist with BLE repositioning and using RUE to pull    Transfers Overall transfer level: Needs assistance Equipment used: 2 person hand held assist Transfers: Sit to/from Stand Sit to Stand: Max assist, +2 physical assistance, +2 safety/equipment    General transfer comment: MaxAx2 to boost-up and steady with [facility] approach and gait belt. Able to stand for ~2 minutes for pericare before needing to return to sitting    Ambulation/Gait  General Gait Details: Unable this date     Balance Overall balance assessment: History of Falls, Needs assistance Sitting-balance support: Feet supported, Bilateral upper extremity supported Sitting balance-Leahy Scale: Fair Sitting balance - Comments: CGA sitting EOB by end of session. Slight anterior lean Postural control: Other (comment) (slight anterior lean) Standing balance support: Bilateral upper extremity supported Standing balance-Leahy Scale: Poor Standing balance comment: reliant on ext support     Communication Communication Communication: Impaired Factors Affecting Communication: Hearing impaired (Extremely HOH)  Cognition Arousal: Obtunded Behavior During Therapy: Flat affect   PT - Cognitive impairments: No family/caregiver present to determine baseline, Orientation, Awareness, Memory, Attention, Sequencing, Problem solving, Safety/Judgement   Orientation impairments: Time, Situation    Following commands: Impaired Following commands impaired: Follows one step commands inconsistently, Follows one step commands with increased time     Cueing Cueing Techniques: Verbal cues, Tactile cues, Gestural cues, Visual cues     General Comments General comments (skin integrity, edema, etc.): Pt c/o dizziness once EOB, no BP cuff in room  to assess vitals. Pt fast asleep upon return to supine at end of session.      Pertinent Vitals/Pain Pain Assessment Pain Assessment: No/denies pain     PT Goals (current goals can now be found in the care plan section) Acute Rehab PT Goals PT Goal Formulation: Patient unable to participate in goal setting Time For Goal Achievement: 08/05/24 Potential to Achieve Goals: Fair Progress towards PT goals: Progressing toward goals    Frequency    Min 2X/week           Co-evaluation   Reason for Co-Treatment: Necessary to address cognition/behavior during functional activity;For patient/therapist safety;To address functional/ADL transfers PT goals addressed during session: Mobility/safety with mobility;Balance;Proper use of DME        AM-PAC PT 6 Clicks Mobility   Outcome Measure  Help needed turning from your back to your side while in a flat bed without using bedrails?: A Lot Help needed moving from lying on your back to sitting on the side of a flat bed without using bedrails?: Total Help needed moving to and from a bed to a chair (including a wheelchair)?: Total Help needed standing up from a chair using your arms (e.g., wheelchair or bedside chair)?: Total Help needed to walk in hospital room?: Total Help needed climbing 3-5 steps with a railing? : Total 6 Click Score: 7    End of Session   Activity Tolerance: Patient limited by lethargy Patient left: in bed;with call bell/phone within reach;with bed alarm set;Other (comment) Advertising copywriter) Nurse Communication: Mobility status PT Visit Diagnosis: Other abnormalities of gait and mobility (R26.89);Muscle weakness (generalized) (M62.81);History of falling (Z91.81)     Time: 1434-1500 PT Time Calculation (min) (ACUTE ONLY): 26  min  Charges:   $Therapeutic Activity: 8-22 mins PT General Charges $$ ACUTE PT VISIT: 1 Visit                     Kate ORN, PT, DPT Secure Chat Preferred  Rehab Office [phone]   Kate BRAVO Wendolyn 07/24/2024, 4:20 PM

## 2024-07-24 NOTE — Plan of Care (Signed)

## 2024-07-24 NOTE — Progress Notes (Signed)
 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 Family reported that patient had fallen about a week prior to admission.  Family noticed patient had developed nausea and vomiting few days prior to admission he was becoming weaker and confused.  Family brought him to the ED because they thought he might have another DKA..  In the ED, he was febrile with temperature up to 102.3 F, tachypneic with respiratory rate up to 25, heart rate 88, BP 107/68 and O2 sat 97% on 2 L of oxygen  Initial glucose on admission was 415.  Subsequently, patient developed hypoglycemia with glucose dropping to 61.  Lactic acid was 5.4 but subsequently came down to 1.6.  CT chest abdomen and pelvis suspicious for right lung pneumonia possibly aspiration.  Moderate compression fracture deformity at L1, moderate nondependent causing the blood concerning for cystitis versus instrumentation also noted on imaging.  CT head without contrast: IMPRESSION: 1. Acute left posterior convexity subdural hematoma, measuring up to 8 mm, without midline shift.  07/24/2024: Blood cultures growing Klebsiella pneumonia.  Antibiotics will be changed to IV Rocephin  2 g daily.   Assessment/Plan:   Principal Problem:   Severe sepsis (HCC) Active Problems:   Benign essential HTN   Prostate cancer (HCC)   Prolonged QT interval   Heart block AV complete (HCC)   History of stroke   Hyperlipidemia   Acute encephalopathy   Uncontrolled type 2 diabetes mellitus with hyperglycemia, with long-term current use of  insulin  (HCC)   Mobitz type 1 second degree atrioventricular block   Aspiration pneumonia (HCC)   Acute respiratory failure with hypoxia (HCC)   CKD (chronic kidney disease) stage 3, GFR 30-59 ml/min (HCC)   Subdural hematoma (HCC)   L1 vertebral fracture (HCC)   Biliary fistula    Body mass index is 31.25 kg/m.  (Class I obesity)    Severe sepsis secondary to community-acquired pneumonia, probable right aspiration pneumonia, acute UTI: Urine culture showed insignificant growth. Blood culture showed Staph capitis in an aerobic bottle only (1 out of 4 culture bottles).  This is likely a contaminant. Continue IV Unasyn .  IV vancomycin  has been discontinued. MRSA screen was negative. 07/23/2024: Change antibiotics to IV Rocephin  2 g daily.  Blood cultures growing Klebsiella pneumonia.   Hypotension:  -Resolved.    Acute kidney injury: - Slowly resolving. - Serum creatinine of 1.28 today. - Continue to avoid nephrotoxins. - Keep MAP greater than 65 mmHg.    Acute metabolic encephalopathy: - Continue supportive care. -As per prior documentation suspected dementia: His daughter said he stays confused and has been like this for over 2 years and his confusion has actually been worsening.  She suspects he has dementia although he has not been formally diagnosed with it. 07/24/2024: No behavioral problems.   Acute left-sided subdural hematoma (8 mm) without midline shift: Repeat CT head showed stability of SDH, measuring 6 mm.  He has been evaluated by neurosurgery team. Conservative management for now.  Plavix  to be held for 7 to 10 days.   Prophylactic Keppra  500 mg twice daily for up to 7 days  maximum. Outpatient follow-up with neurosurgeon. Recent fall prior to admission: PT and OT commended discharge to SNF   Type II DM with hyperglycemia, hypoglycemia: She was hyperglycemic on presentation with glucose of 415.  Glucose dropped to 61 on 07/21/2024 at 4:30 AM. Decrease  Lantus  from 10 units to 7 units nightly. Use NovoLog  as needed for hyperglycemia. Adjust insulin  as needed. 07/24/2024: Start sensitive dose sliding scale insulin  coverage.   Hypomagnesemia: Magnesium  is still low at 1.6.  Replete with IV magnesium  sulfate. Hypophosphatemia: Improved  Hypokalemia: Replete potassium and monitor level 07/24/2024: Magnesium  of 1.8 today.  Potassium of 3.8 today.  Phosphorus of 3 today.   Prolonged QTc interval (517): Replete electrolytes and monitor QTc. 07/24/2024: Repeat EKG.   L1 compression fracture: TLSO brace recommended.  Moderate malnutrition: - Albumin  of 1.8. - Consult dietary team. - Caloric count.   Comorbidities include History of complete intermittent heart block followed by cardiologist, History of cholecystitis s/p cholecystostomy tube placement in 2023, s/p percutaneous biliary drain, hypertension, history of stroke (was on aspirin  and Plavix  prior to admission), hyperlipidemia, prostate cancer   Consultants: Neurosurgeon  Procedures: None    Medications:    atorvastatin   40 mg Oral Daily   carbamide peroxide  5 drop Both EARS BID   feeding supplement  237 mL Oral BID BM   insulin  aspart  0-9 Units Subcutaneous Q4H   insulin  glargine-yfgn  7 Units Subcutaneous QHS   levETIRAcetam   500 mg Oral BID   Or   levETIRAcetam   500 mg Intravenous BID   multivitamin with minerals  1 tablet Oral Daily   pantoprazole   40 mg Oral Q supper   thiamine   100 mg Oral Daily   Continuous Infusions:  cefTRIAXone  (ROCEPHIN )  IV 2 g (07/24/24 1530)     Anti-infectives (From admission, onward)    Start     Dose/Rate Route Frequency Ordered Stop   07/24/24 1430  cefTRIAXone  (ROCEPHIN ) 2 g in sodium chloride  0.9 % 100 mL IVPB        2 g 200 mL/hr over 30 Minutes Intravenous Every 24 hours 07/24/24 1334     07/22/24 1446  vancomycin  variable dose per unstable renal function (pharmacist dosing)  Status:  Discontinued         Does not  apply See admin instructions 07/22/24 1446 07/22/24 1600   07/22/24 0400  Ampicillin -Sulbactam (UNASYN ) 3 g in sodium chloride  0.9 % 100 mL IVPB  Status:  Discontinued        3 g 200 mL/hr over 30 Minutes Intravenous Every 6 hours 07/21/24 2359 07/24/24 1334   07/22/24 0000  vancomycin  (VANCOREADY) IVPB 500 mg/100 mL        500 mg 100 mL/hr over 60 Minutes Intravenous  Once 07/21/24 2359 07/22/24 0122   07/21/24 1830  ceFEPIme  (MAXIPIME ) 2 g in sodium chloride  0.9 % 100 mL IVPB        2 g 200 mL/hr over 30 Minutes Intravenous  Once 07/21/24 1818 07/21/24 1939   07/21/24 1830  metroNIDAZOLE  (FLAGYL ) IVPB 500 mg        500 mg 100 mL/hr over 60 Minutes Intravenous  Once 07/21/24 1818 07/21/24 2055   07/21/24 1830  vancomycin  (VANCOCIN ) IVPB 1000 mg/200 mL premix        1,000 mg 200 mL/hr over 60 Minutes Intravenous  Once 07/21/24 1818 07/21/24 2140              Family Communication/Anticipated D/C date and plan/Code Status  DVT prophylaxis: SCDs Start: 07/22/24 0029     Code Status: Full Code  Family Communication: Plan discussed with Michaelle, daughter, over the phone. Disposition Plan: Plan to discharge to SNF   Status is: Inpatient Remains inpatient appropriate because: Severe sepsis   Subjective:   No significant history from patient.  Objective:    Vitals:   07/24/24 0326 07/24/24 0744 07/24/24 1135 07/24/24 1531  BP: (!) 143/63 119/71 (!) 109/59 116/63  Pulse: 72 66 71 74  Resp: 18 14 20 20   Temp: (!) 97.4 F (36.3 C) 98.1 F (36.7 C) 98 F (36.7 C) 98.1 F (36.7 C)  TempSrc: Oral Oral Oral Oral  SpO2: 93% 92% 95% 94%  Weight:      Height:       No data found.   Intake/Output Summary (Last 24 hours) at 07/24/2024 1609 Last data filed at 07/24/2024 0900 Gross per 24 hour  Intake 1064.2 ml  Output 360 ml  Net 704.2 ml   Filed Weights   07/21/24 2343  Weight: 72.6 kg    Exam:   GEN: Not in any distress.  Awake.  Mittens to both hands.    HEENT: Patient is pale.  Dry buccal mucosa. Neck: Supple. Lungs: Clear to auscultation.   CVS: S1-S2. Neuro: Awake and alert. Extremities no leg edema.  Data Reviewed:   I have personally reviewed following labs and imaging studies:  Labs: Labs show the following:   Basic Metabolic Panel: Recent Labs  Lab 07/21/24 1800 07/21/24 1837 07/22/24 0120 07/23/24 0312 07/24/24 0318  NA 132* 134*  133* 134* 138 140  K 4.0 3.8  3.8 4.1 3.4* 3.8  CL 97* 98 102 103 104  CO2 22  --  21* 23 26  GLUCOSE 382* 390* 94 104* 121*  BUN 19 20 18 13 17   CREATININE 1.68* 1.50* 1.26* 1.19 1.28*  CALCIUM  9.4  --  8.7* 9.1 8.8*  MG  --   --  1.5* 1.6* 1.8  PHOS  --   --  2.1* 2.5 3.0   GFR Estimated Creatinine Clearance: 36.5 mL/min (A) (by C-G formula based on SCr of 1.28 mg/dL (H)). Liver Function Tests: Recent Labs  Lab 07/21/24 1800 07/22/24 0120 07/23/24 0312 07/24/24 0318  AST 19 24  --   --   ALT 8 7  --   --   ALKPHOS 92 69  --   --   BILITOT 1.1 1.4*  --   --   PROT 6.5 5.4*  --   --   ALBUMIN  2.2* 1.8* 2.0* 1.8*   Recent Labs  Lab 07/21/24 1800  LIPASE 21   No results for input(s): AMMONIA in the last 168 hours. Coagulation profile Recent Labs  Lab 07/21/24 1800  INR 1.3*    CBC: Recent Labs  Lab 07/21/24 1800 07/21/24 1837 07/22/24 0120 07/22/24 0426 07/23/24 0312 07/24/24 0318  WBC 19.8*  --  18.5* 16.9* 13.7* 10.4  NEUTROABS 18.0*  --   --   --   --   --   HGB 10.9* 11.6*  12.2* 9.1* 9.2* 9.7* 9.1*  HCT 33.9* 34.0*  36.0* 29.0* 29.1* 30.3* 28.7*  MCV 77.0*  --  78.8* 78.0* 77.9* 77.8*  PLT 464*  --  355 362 374 391   Cardiac Enzymes: Recent Labs  Lab 07/22/24 0120  CKTOTAL 259   BNP (last 3 results) No results for input(s): PROBNP in the last 8760 hours. CBG: Recent Labs  Lab 07/23/24  2331 07/24/24 0323 07/24/24 0743 07/24/24 1135 07/24/24 1538  GLUCAP 133* 125* 195* 258* 168*   D-Dimer: No results for input(s): DDIMER in  the last 72 hours. Hgb A1c: Recent Labs    07/21/24 1800  HGBA1C 10.7*   Lipid Profile: No results for input(s): CHOL, HDL, LDLCALC, TRIG, CHOLHDL, LDLDIRECT in the last 72 hours. Thyroid  function studies: Recent Labs    07/22/24 0120  TSH 2.084   Anemia work up: Recent Labs    07/22/24 0500  VITAMINB12 196   Sepsis Labs: Recent Labs  Lab 07/21/24 2000 07/22/24 0120 07/22/24 0122 07/22/24 0426 07/22/24 1600 07/23/24 0312 07/24/24 0318  PROCALCITON  --  7.76  --   --   --   --   --   WBC  --  18.5*  --  16.9*  --  13.7* 10.4  LATICACIDVEN 4.0*  --  2.5* 1.6 1.8  --   --     Microbiology Recent Results (from the past 240 hours)  Culture, blood (Routine x 2)     Status: None (Preliminary result)   Collection Time: 07/21/24  6:00 PM   Specimen: BLOOD  Result Value Ref Range Status   Specimen Description BLOOD RIGHT ANTECUBITAL  Final   Special Requests   Final    BOTTLES DRAWN AEROBIC AND ANAEROBIC Blood Culture adequate volume   Culture  Setup Time   Final    GRAM NEGATIVE RODS AEROBIC BOTTLE ONLY CRITICAL RESULT CALLED TO, READ BACK BY AND VERIFIED WITH: [facility] MADELINE MITCHELL [phone] AT 1316 BY EC Performed at [facility], [address] [place], [place] [address]    Culture GRAM NEGATIVE RODS  Final   Report Status PENDING  Incomplete  Blood Culture ID Panel (Reflexed)     Status: Abnormal   Collection Time: 07/21/24  6:00 PM  Result Value Ref Range Status   Enterococcus faecalis NOT DETECTED NOT DETECTED Final   Enterococcus Faecium NOT DETECTED NOT DETECTED Final   Listeria monocytogenes NOT DETECTED NOT DETECTED Final   Staphylococcus species NOT DETECTED NOT DETECTED Final   Staphylococcus aureus (BCID) NOT DETECTED NOT DETECTED Final   Staphylococcus epidermidis NOT DETECTED NOT DETECTED Final   Staphylococcus lugdunensis NOT DETECTED NOT DETECTED Final   Streptococcus species NOT DETECTED NOT DETECTED Final   Streptococcus  agalactiae NOT DETECTED NOT DETECTED Final   Streptococcus pneumoniae NOT DETECTED NOT DETECTED Final   Streptococcus pyogenes NOT DETECTED NOT DETECTED Final   A.calcoaceticus-baumannii NOT DETECTED NOT DETECTED Final   Bacteroides fragilis NOT DETECTED NOT DETECTED Final   Enterobacterales DETECTED (A) NOT DETECTED Final    Comment: Enterobacterales represent a large order of gram negative bacteria, not a single organism. CRITICAL RESULT CALLED TO, READ BACK BY AND VERIFIED WITH: PHARMD MADELINE MITCHELL [phone] AT 1316 BY EC    Enterobacter cloacae complex NOT DETECTED NOT DETECTED Final   Escherichia coli NOT DETECTED NOT DETECTED Final   Klebsiella aerogenes NOT DETECTED NOT DETECTED Final   Klebsiella oxytoca NOT DETECTED NOT DETECTED Final   Klebsiella pneumoniae DETECTED (A) NOT DETECTED Final    Comment: CRITICAL RESULT CALLED TO, READ BACK BY AND VERIFIED WITH: PHARMD MADELINE MITCHELL [phone] AT 1316 BY EC    Proteus species NOT DETECTED NOT DETECTED Final   Salmonella species NOT DETECTED NOT DETECTED Final   Serratia marcescens NOT DETECTED NOT DETECTED Final   Haemophilus influenzae NOT DETECTED NOT DETECTED Final   Neisseria meningitidis NOT DETECTED NOT DETECTED Final  Pseudomonas aeruginosa NOT DETECTED NOT DETECTED Final   Stenotrophomonas maltophilia NOT DETECTED NOT DETECTED Final   Candida albicans NOT DETECTED NOT DETECTED Final   Candida auris NOT DETECTED NOT DETECTED Final   Candida glabrata NOT DETECTED NOT DETECTED Final   Candida krusei NOT DETECTED NOT DETECTED Final   Candida parapsilosis NOT DETECTED NOT DETECTED Final   Candida tropicalis NOT DETECTED NOT DETECTED Final   Cryptococcus neoformans/gattii NOT DETECTED NOT DETECTED Final   CTX-M ESBL NOT DETECTED NOT DETECTED Final   Carbapenem resistance IMP NOT DETECTED NOT DETECTED Final   Carbapenem resistance KPC NOT DETECTED NOT DETECTED Final   Carbapenem resistance NDM NOT DETECTED NOT  DETECTED Final   Carbapenem resist OXA 48 LIKE NOT DETECTED NOT DETECTED Final   Carbapenem resistance VIM NOT DETECTED NOT DETECTED Final    Comment: Performed at [facility], [address] [place], [place] [address]  Culture, blood (Routine x 2)     Status: Abnormal   Collection Time: 07/21/24  6:10 PM   Specimen: BLOOD LEFT ARM  Result Value Ref Range Status   Specimen Description BLOOD LEFT ARM  Final   Special Requests   Final    BOTTLES DRAWN AEROBIC AND ANAEROBIC Blood Culture adequate volume   Culture  Setup Time   Final    GRAM POSITIVE COCCI IN CLUSTERS ANAEROBIC BOTTLE ONLY CRITICAL RESULT CALLED TO, READ BACK BY AND VERIFIED WITH: PHARMD LISA CURRAN ON 07/22/24 @ 2011 BY DRT    Culture (A)  Final    STAPHYLOCOCCUS CAPITIS THE SIGNIFICANCE OF ISOLATING THIS ORGANISM FROM A SINGLE SET OF BLOOD CULTURES WHEN MULTIPLE SETS ARE DRAWN IS UNCERTAIN. PLEASE NOTIFY THE MICROBIOLOGY DEPARTMENT WITHIN ONE WEEK IF SPECIATION AND SENSITIVITIES ARE REQUIRED. Performed at [facility], [address] [place], [place] [address]    Report Status 07/24/2024 FINAL  Final  Blood Culture ID Panel (Reflexed)     Status: Abnormal   Collection Time: 07/21/24  6:10 PM  Result Value Ref Range Status   Enterococcus faecalis NOT DETECTED NOT DETECTED Final   Enterococcus Faecium NOT DETECTED NOT DETECTED Final   Listeria monocytogenes NOT DETECTED NOT DETECTED Final   Staphylococcus species DETECTED (A) NOT DETECTED Final    Comment: CRITICAL RESULT CALLED TO, READ BACK BY AND VERIFIED WITH: PHARMD LISA CURRAN ON 07/22/24 @ 2011 BY DRT    Staphylococcus aureus (BCID) NOT DETECTED NOT DETECTED Final   Staphylococcus epidermidis NOT DETECTED NOT DETECTED Final   Staphylococcus lugdunensis NOT DETECTED NOT DETECTED Final   Streptococcus species NOT DETECTED NOT DETECTED Final   Streptococcus agalactiae NOT DETECTED NOT DETECTED Final   Streptococcus pneumoniae NOT DETECTED NOT  DETECTED Final   Streptococcus pyogenes NOT DETECTED NOT DETECTED Final   A.calcoaceticus-baumannii NOT DETECTED NOT DETECTED Final   Bacteroides fragilis NOT DETECTED NOT DETECTED Final   Enterobacterales NOT DETECTED NOT DETECTED Final   Enterobacter cloacae complex NOT DETECTED NOT DETECTED Final   Escherichia coli NOT DETECTED NOT DETECTED Final   Klebsiella aerogenes NOT DETECTED NOT DETECTED Final   Klebsiella oxytoca NOT DETECTED NOT DETECTED Final   Klebsiella pneumoniae NOT DETECTED NOT DETECTED Final   Proteus species NOT DETECTED NOT DETECTED Final   Salmonella species NOT DETECTED NOT DETECTED Final   Serratia marcescens NOT DETECTED NOT DETECTED Final   Haemophilus influenzae NOT DETECTED NOT DETECTED Final   Neisseria meningitidis NOT DETECTED NOT DETECTED Final   Pseudomonas aeruginosa NOT DETECTED NOT DETECTED  Final   Stenotrophomonas maltophilia NOT DETECTED NOT DETECTED Final   Candida albicans NOT DETECTED NOT DETECTED Final   Candida auris NOT DETECTED NOT DETECTED Final   Candida glabrata NOT DETECTED NOT DETECTED Final   Candida krusei NOT DETECTED NOT DETECTED Final   Candida parapsilosis NOT DETECTED NOT DETECTED Final   Candida tropicalis NOT DETECTED NOT DETECTED Final   Cryptococcus neoformans/gattii NOT DETECTED NOT DETECTED Final    Comment: Performed at [facility], [address], [place], [place] [address]  Resp panel by RT-PCR (RSV, Flu A&B, Covid) Anterior Nasal Swab     Status: None   Collection Time: 07/21/24  6:17 PM   Specimen: Anterior Nasal Swab  Result Value Ref Range Status   SARS Coronavirus 2 by RT PCR NEGATIVE NEGATIVE Final   Influenza A by PCR NEGATIVE NEGATIVE Final   Influenza B by PCR NEGATIVE NEGATIVE Final    Comment: (NOTE) The Xpert Xpress SARS-CoV-2/FLU/RSV plus assay is intended as an aid in the diagnosis of influenza from Nasopharyngeal swab specimens and should not be used as a sole basis for treatment. Nasal washings  and aspirates are unacceptable for Xpert Xpress SARS-CoV-2/FLU/RSV testing.  Fact Sheet for Patients: BloggerCourse.com  Fact Sheet for Healthcare Providers: SeriousBroker.it  This test is not yet approved or cleared by the [place]  FDA and has been authorized for detection and/or diagnosis of SARS-CoV-2 by FDA under an Emergency Use Authorization (EUA). This EUA will remain in effect (meaning this test can be used) for the duration of the COVID-19 declaration under Section 564(b)(1) of the Act, 21 U.S.C. section 360bbb-3(b)(1), unless the authorization is terminated or revoked.     Resp Syncytial Virus by PCR NEGATIVE NEGATIVE Final    Comment: (NOTE) Fact Sheet for Patients: BloggerCourse.com  Fact Sheet for Healthcare Providers: SeriousBroker.it  This test is not yet approved or cleared by the [place]  FDA and has been authorized for detection and/or diagnosis of SARS-CoV-2 by FDA under an Emergency Use Authorization (EUA). This EUA will remain in effect (meaning this test can be used) for the duration of the COVID-19 declaration under Section 564(b)(1) of the Act, 21 U.S.C. section 360bbb-3(b)(1), unless the authorization is terminated or revoked.  Performed at [facility], [address], [place], [place] [address]   Urine Culture (for pregnant, neutropenic or urologic patients or patients with an indwelling urinary catheter)     Status: Abnormal   Collection Time: 07/22/24  4:26 AM   Specimen: Urine, Clean Catch  Result Value Ref Range Status   Specimen Description URINE, CLEAN CATCH  Final   Special Requests NONE  Final   Culture (A)  Final    <10,000 COLONIES/mL INSIGNIFICANT GROWTH Performed at [facility], [address] [place], [place] [address]    Report Status 07/23/2024 FINAL  Final  MRSA Next Gen by PCR, Nasal     Status: None    Collection Time: 07/22/24  4:26 AM   Specimen: Urine, Clean Catch; Nasal Swab  Result Value Ref Range Status   MRSA by PCR Next Gen NOT DETECTED NOT DETECTED Final    Comment: (NOTE) The GeneXpert MRSA Assay (FDA approved for NASAL specimens only), is one component of a comprehensive MRSA colonization surveillance program. It is not intended to diagnose MRSA infection nor to guide or monitor treatment for MRSA infections. Test performance is not FDA approved in patients less than 39 years old. Performed at [facility],  [address], [place], [place] [address]     Procedures and diagnostic studies:  No results found.   Time spent: 55 minutes.    LOS: 3 days   Leatrice LILLETTE Chapel  [facility]   Pager on [email or web address]. If 7PM-7AM, please contact night-coverage at [email or web address]     07/24/2024, 4:09 PM

## 2024-07-24 NOTE — Progress Notes (Signed)
 Daily Progress Note   Patient Name: Andre Stout       Date: 07/24/2024 DOB: Feb 09, 1940  Age: 84 y.o. MRN#: [id number] Attending Physician: Rosario Leatrice FERNS, MD Primary Care Physician: Jason Leita Repine, FNP Admit Date: 07/21/2024  Reason for Consultation/Follow-up: Establishing goals of care  Subjective: I have reviewed medical records including EPIC notes, MAR, any available advanced directives as necessary, and labs. Received report from primary RN - no acute concerns. RN reports patient has been lethargic but does wake to eat/drink. RN reports he ate 100% of breakfast.  Santina to visit patient at bedside - RN present providing hygiene care. Patient is lying in bed with eyes closed - he responds briefly to voice/gentle touch but eyes remain closed. Continued to note he is HOH. No signs or non-verbal gestures of pain or discomfort noted. No respiratory distress, increased work of breathing, or secretions noted. Mitts in place.  10:30 AM Called daughter/Andre Stout - emotional support provided. Andre Stout has been speaking with TOC regarding discharge to rehab. She explains that patient would really prefer to come home; however, understands he is very weak and is total care.   We discussed patient's previous experience with HH - Andre Stout tells me that he does not like PT and has refused to work with them in the past. We reviewed PT/OT evaluation that stated patient was with decreased level of alertness, not able to follow commands, and not actively participating. Expressed concern patient may not find benefit with PT/OT at rehab in this state and from previously expressed wishes - she agrees. We reviewed that patient, unfortunately, is not showing improvement at this time.  Reviewed option  of patient returning home with hospice. Provided education and counseling at length on the philosophy and benefits of hospice care. Discussed that it offers a holistic approach to care in the setting of end-stage illness, and is about supporting the patient where they are allowing nature to take it's course. Discussed the hospice team includes RNs, physicians, social workers, and chaplains. They can provide personal care, support for the family, and help keep patient out of the hospital as well as assist with DME needs for home hospice. Education provided on the difference between home vs residential hospice.   Prior to hospitalization, patient was already receiving 24/7 care between multiple family  members and she states they would be able to continue this with home hospice. She would like to discuss information from today further with her family and will have final decisions tomorrow regarding discharge to rehab vs home with hospice.  All questions and concerns addressed. Encouraged to call with questions and/or concerns. PMT number previously provided.  Length of Stay: 3  Current Medications: Scheduled Meds:   atorvastatin   40 mg Oral Daily   carbamide peroxide  5 drop Both EARS BID   feeding supplement  237 mL Oral BID BM   insulin  aspart  0-9 Units Subcutaneous Q4H   insulin  glargine-yfgn  7 Units Subcutaneous QHS   levETIRAcetam   500 mg Oral BID   Or   levETIRAcetam   500 mg Intravenous BID   multivitamin with minerals  1 tablet Oral Daily   pantoprazole   40 mg Oral Q supper   thiamine   100 mg Oral Daily    Continuous Infusions:  ampicillin -sulbactam (UNASYN ) IV 3 g (07/24/24 0534)    PRN Meds: acetaminophen  **OR** acetaminophen , albuterol , dextrose , fentaNYL  (SUBLIMAZE ) injection, HYDROcodone -acetaminophen   Physical Exam Vitals and nursing note reviewed.  Constitutional:      General: He is not in acute distress. Pulmonary:     Effort: No respiratory distress.  Skin:     General: Skin is warm and dry.  Neurological:     Mental Status: He is lethargic.     Motor: Weakness present.  Psychiatric:        Cognition and Memory: Cognition is impaired. Memory is impaired.             Vital Signs: BP 119/71 (BP Location: Right Arm)   Pulse 66   Temp 98.1 F (36.7 C) (Oral)   Resp 14   Ht 5' (1.524 m)   Wt 72.6 kg Comment: from May 2025 records  SpO2 92%   BMI 31.25 kg/m  SpO2: SpO2: 92 % O2 Device: O2 Device: Room Air O2 Flow Rate: O2 Flow Rate (L/min): 3 L/min  Intake/output summary:  Intake/Output Summary (Last 24 hours) at 07/24/2024 1022 Last data filed at 07/24/2024 0900 Gross per 24 hour  Intake 1420.2 ml  Output 360 ml  Net 1060.2 ml   LBM: Last BM Date : 07/23/24 Baseline Weight: Weight: 72.6 kg (from May 2025 records) Most recent weight: Weight: 72.6 kg (from May 2025 records)       Palliative Assessment/Data: PPS 30%      Patient Active Problem List   Diagnosis Date Noted   L1 vertebral fracture (HCC) 07/22/2024   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   Severe sepsis (HCC) 07/21/2024   Aspiration pneumonia (HCC) 07/21/2024   Acute respiratory failure with hypoxia (HCC) 07/21/2024   CKD (chronic kidney disease) stage 3, GFR 30-59 ml/min (HCC) 07/21/2024   Subdural hematoma (HCC) 07/21/2024   Irritant contact dermatitis associated with stoma 03/13/2024   Persistent postoperative fistula 03/13/2024   GERD without esophagitis 03/05/2024   Uncontrolled type 2 diabetes mellitus with hyperglycemia, with long-term current use of insulin  (HCC) 03/05/2024   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   History of complete heart block 03/05/2024   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   Malnutrition of moderate degree 01/20/2024   Hyperlipidemia 01/17/2024   Leukocytosis 01/17/2024   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 (HCC) 01/17/2024   Acute encephalopathy 01/17/2024   fracture of distal diaphyseal  metaphyseal junction of the 5th left metacarpal with dorsal angulation 01/17/2024   Cholecystostomy care (HCC) 01/17/2024  Migration of percutaneous cholecystostomy tube 08/18/2023   Hypertension associated with diabetes (HCC) 08/18/2023   History of stroke 08/18/2023   Mixed 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   Of note, patient has had 2 admissions in the last 6 months.  Assessment: Principal Problem:   Severe sepsis (HCC) Active Problems:   Benign essential HTN   Prostate cancer (HCC)   Prolonged QT interval   Heart block AV complete (HCC)   History of stroke   Hyperlipidemia   Acute encephalopathy   Uncontrolled type 2 diabetes mellitus with hyperglycemia, with long-term current use of insulin  (HCC)   Mobitz type 1 second degree atrioventricular block   Aspiration pneumonia (HCC)   Acute respiratory failure with hypoxia (HCC)   CKD (chronic kidney disease) stage 3, GFR 30-59 ml/min (HCC)   Subdural hematoma (HCC)   L1 vertebral fracture (HCC)   Biliary  fistula   Concern about end of life  Recommendations/Plan: Continue current care Continue full code as previously documented Daughter is considering his discharge home with hospice but would like to discuss information provided today with other family. She is open for PMT follow up tomorrow 7/26 for final decisions  Ongoing code status discussions PMT will continue to follow and support holistically  Goals of Care and Additional Recommendations: Limitations on Scope of Treatment: Full Scope Treatment  Code Status:    Code Status Orders  (From admission, onward)           Start     Ordered   07/21/24 2346  Full code  Continuous       Question:  By:  Answer:  Consent: discussion documented in EHR   07/21/24 2346           Code Status History     Date Active Date Inactive Code Status Order ID Comments User Context   07/21/2024 2327 07/21/2024 2346 Full Code [id number]  Arloa Lavanda RIGGERS ED   03/03/2024 1327 03/05/2024 2334 Full Code [id number]  Zella Katha HERO, MD ED   01/17/2024 1823 01/23/2024 0105 Full Code [id number]  Waddell Rake, MD ED   08/18/2023 1914 08/20/2023 2211 Full Code [id number]  Tobie Jorie SAUNDERS, MD Inpatient   10/15/2022 0335 10/17/2022 0001 Full Code [id number]  Shona Terry SAILOR, DO ED   08/14/2022 1533 08/28/2022 1830 Full Code [id number]  Laurita Cort DASEN, MD ED   08/01/2022 0025 08/05/2022 0021 Full Code [id number]  Laveda Roosevelt, MD ED   01/02/2022 2237 01/06/2022 0138 Full Code [id number]  Seena Marsa NOVAK, MD ED   10/09/2020 1827 10/10/2020 2259 Full Code [id number]  Sim Emery CROME, MD ED   03/04/2016 1607 03/07/2016 2149 Full Code  [id number]  Mavis Ritchie BROCKS, MD Inpatient       Prognosis:  Poor  Discharge Planning: To Be Determined  Care plan was discussed with primary RN, patient's family, TOC, Dr. Rosario  Thank you for allowing the Palliative Medicine Team to assist in the care of this patient.   Total Time 50 minutes Prolonged Time Billed  no        Jeoffrey CHRISTELLA Sharps, NP  Please contact Palliative Medicine Team phone at [phone] for questions and concerns.   *Portions of this note are a verbal dictation therefore any spelling and/or grammatical errors are due to the Dragon Medical One system interpretation.

## 2024-07-24 NOTE — TOC Progression Note (Signed)
 Transition of Care [facility]) - Progression Note    Patient Details  Name: Andre Stout MRN: [id number] Date of Birth: January 20, 1940  [facility] of [facility] CM/SW Contact  Almarie CHRISTELLA Goodie, [place] Phone Number: 07/24/2024, 11:53 AM  Clinical Narrative:   CSW spoke with patient's daughter to discuss decisions on SNF vs home. Daughter said she was hoping to discuss it with the doctor more before he's ready for discharge, but agreed to review the SNF options. CSW provided bed offers. Daughter asked about possibility of [facility]. CSW sent referral and contacted [facility] to review. [facility] won't know more until Monday, will update CSW then.  CSW updated by palliative team that patient's daughter considering home with hospice instead, will follow for determination of disposition.     Expected Discharge Plan: Skilled Nursing Facility Barriers to Discharge: Continued Medical Work up, SNF Pending bed offer, English as a second language teacher               Expected Discharge Plan and Services     Post Acute Care Choice: Skilled Nursing Facility Living arrangements for the past 2 months: Single Family Home                             HH Agency: [facility]         Social Drivers of Health (SDOH) Interventions SDOH Screenings   Food Insecurity: Patient Unable To Answer (07/23/2024)  Housing: Unknown (07/23/2024)  Transportation Needs: Patient Unable To Answer (07/23/2024)  Utilities: Patient Unable To Answer (07/23/2024)  Alcohol Screen: Low Risk  (07/24/2023)  Depression (PHQ2-9): Low Risk  (07/24/2023)  Financial Resource Strain: Low Risk  (07/24/2023)  Physical Activity: Inactive (07/24/2023)  Social Connections: Patient Unable To Answer (07/23/2024)  Stress: No Stress Concern Present (07/24/2023)  Tobacco Use: Medium Risk (05/07/2024)  Health Literacy: Adequate Health Literacy (07/24/2023)    Readmission Risk Interventions    08/15/2022    9:25 AM  Readmission  Risk Prevention Plan  Transportation Screening Complete  PCP or Specialist Appt within 5-7 Days Complete  Home Care Screening Complete  Medication Review (RN CM) Complete

## 2024-07-24 NOTE — Progress Notes (Signed)
 PHARMACY - PHYSICIAN COMMUNICATION CRITICAL VALUE ALERT - BLOOD CULTURE IDENTIFICATION (BCID)  Andre Stout is an 84 y.o. male who presented to [facility] on 07/21/2024 with a chief complaint of sepsis  Assessment:   1/4 BCX with Klebsiella pneumoniae (no resistance on BCID)  Name of physician (or Provider) Contacted: [place]  Current antibiotics: unasyn    Changes to prescribed antibiotics recommended:  Unasyn  > ceftriaxone  2g IV q24h  Results for orders placed or performed during the hospital encounter of 07/21/24  Blood Culture ID Panel (Reflexed) (Collected: 07/21/2024  6:00 PM)  Result Value Ref Range   Enterococcus faecalis NOT DETECTED NOT DETECTED   Enterococcus Faecium NOT DETECTED NOT DETECTED   Listeria monocytogenes NOT DETECTED NOT DETECTED   Staphylococcus species NOT DETECTED NOT DETECTED   Staphylococcus aureus (BCID) NOT DETECTED NOT DETECTED   Staphylococcus epidermidis NOT DETECTED NOT DETECTED   Staphylococcus lugdunensis NOT DETECTED NOT DETECTED   Streptococcus species NOT DETECTED NOT DETECTED   Streptococcus agalactiae NOT DETECTED NOT DETECTED   Streptococcus pneumoniae NOT DETECTED NOT DETECTED   Streptococcus pyogenes NOT DETECTED NOT DETECTED   A.calcoaceticus-baumannii NOT DETECTED NOT DETECTED   Bacteroides fragilis NOT DETECTED NOT DETECTED   Enterobacterales DETECTED (A) NOT DETECTED   Enterobacter cloacae complex NOT DETECTED NOT DETECTED   Escherichia coli NOT DETECTED NOT DETECTED   Klebsiella aerogenes NOT DETECTED NOT DETECTED   Klebsiella oxytoca NOT DETECTED NOT DETECTED   Klebsiella pneumoniae DETECTED (A) NOT DETECTED   Proteus species NOT DETECTED NOT DETECTED   Salmonella species NOT DETECTED NOT DETECTED   Serratia marcescens NOT DETECTED NOT DETECTED   Haemophilus influenzae NOT DETECTED NOT DETECTED   Neisseria meningitidis NOT DETECTED NOT DETECTED   Pseudomonas aeruginosa NOT DETECTED NOT DETECTED   Stenotrophomonas maltophilia  NOT DETECTED NOT DETECTED   Candida albicans NOT DETECTED NOT DETECTED   Candida auris NOT DETECTED NOT DETECTED   Candida glabrata NOT DETECTED NOT DETECTED   Candida krusei NOT DETECTED NOT DETECTED   Candida parapsilosis NOT DETECTED NOT DETECTED   Candida tropicalis NOT DETECTED NOT DETECTED   Cryptococcus neoformans/gattii NOT DETECTED NOT DETECTED   CTX-M ESBL NOT DETECTED NOT DETECTED   Carbapenem resistance IMP NOT DETECTED NOT DETECTED   Carbapenem resistance KPC NOT DETECTED NOT DETECTED   Carbapenem resistance NDM NOT DETECTED NOT DETECTED   Carbapenem resist OXA 48 LIKE NOT DETECTED NOT DETECTED   Carbapenem resistance VIM NOT DETECTED NOT DETECTED    Sharyne Glatter, PharmD, BCCCP Critical Care Clinical Pharmacist 07/24/2024 1:21 PM

## 2024-07-24 NOTE — Care Management Important Message (Signed)
 Important Message  Patient Details  Name: Andre Stout MRN: [id number] Date of Birth: Jun 21, 1940   Important Message Given:  Yes - Medicare IM     [name] 07/24/2024, 4:15 PM

## 2024-07-24 NOTE — Progress Notes (Signed)
 Occupational Therapy Treatment Patient Details Name: Andre Stout MRN: [id number] DOB: [birth year] Today's Date: 07/24/2024   History of present illness 84 y.o. male presents to [facility] 07/21/24 for generalized fatigue and weakness with a fall one week prior. CT head showed acute L posterior convexity SDH. Pt also with acute encephalopathy. CT chest showed moderate compression fx at L1. PMH: AV block, hypertension, type 2 diabetes, prostate cancer, glaucoma, HTNm, Cholecystitis s/p cholecystostomy in 07/2022.   OT comments  Pt making slow progression towards pt focused goals, unable to follow commands well as he is very HOH, he did not even follow along with multimodal commands. Pt able to get to EOB with mod A to mod A+2, demonstrated some initiation for ADL tasks, and was able to stand with MaxA +2 needing help to boost into full upright position. He now requires setup to total A for ADLs but still has more significant needs for ADLs than not. OT to continue to progress pt as able. Patient will benefit from continued inpatient follow up therapy, <3 hours/day       If plan is discharge home, recommend the following:  Assistance with cooking/housework;Assist for transportation;Help with stairs or ramp for entrance;Two people to help with bathing/dressing/bathroom;Two people to help with walking and/or transfers   Equipment Recommendations  Other (comment) (defer to next level of care)    Recommendations for Other Services      Precautions / Restrictions Precautions Precautions: Fall;Back Precaution Booklet Issued: No Recall of Precautions/Restrictions: Impaired Precaution/Restrictions Comments: SBP <160 Required Braces or Orthoses: Spinal Brace Restrictions Weight Bearing Restrictions Per Provider Order: No       Mobility Bed Mobility Overal bed mobility: Needs Assistance Bed Mobility: Rolling, Sidelying to Sit Rolling: Mod assist Sidelying to sit: Mod assist, +2 for physical  assistance, +2 for safety/equipment       General bed mobility comments: Pt not able to interpret cues, some assist with BLE repositioning and using RUE to pull    Transfers Overall transfer level: Needs assistance Equipment used: 2 person hand held assist Transfers: Sit to/from Stand Sit to Stand: Max assist, +2 physical assistance, +2 safety/equipment                 Balance Overall balance assessment: History of Falls, Needs assistance Sitting-balance support: Feet supported, Bilateral upper extremity supported Sitting balance-Leahy Scale: Fair Sitting balance - Comments: CGA sitting EOB by end of session. Slight anterior lean Postural control: Other (comment) (Slight anterior lean) Standing balance support: Bilateral upper extremity supported Standing balance-Leahy Scale: Poor Standing balance comment: reliant on ext support                           ADL either performed or assessed with clinical judgement   ADL       Grooming: Wash/dry face;Bed level;Set up Grooming Details (indicate cue type and reason): wash face using LUE Upper Body Bathing: Sitting;Moderate assistance           Lower Body Dressing: Total assistance;Bed level (don socks.)       Toileting- Clothing Manipulation and Hygiene: Maximal assistance;+2 for physical assistance;+2 for safety/equipment Toileting - Clothing Manipulation Details (indicate cue type and reason): rear pericare     Functional mobility during ADLs: +2 for physical assistance;+2 for safety/equipment;Maximal assistance (to stand EOB)      Extremity/Trunk Assessment Upper Extremity Assessment Upper Extremity Assessment: Difficult to assess due to impaired cognition RUE Deficits / Details: seemed to  have some flexor tone 2/4 MAS, PROM WFL, pt able to use it to prop self while seated EOB. Able to sustain in 80 deg shoulder flex position once placed by OT LUE Deficits / Details: Able to sustain in 80 deg shoulder  flex position once placed by OT, using to prop self seated EOB       Cervical / Trunk Assessment Cervical / Trunk Assessment: Other exceptions Cervical / Trunk Exceptions: L1 compression fx    Vision   Additional Comments: unable to fully assess, pt not able to hear/interpret commands.   Perception     Praxis     Communication Communication Communication: Impaired Factors Affecting Communication: Hearing impaired (Extremely HOH)   Cognition Arousal: Obtunded Behavior During Therapy: Flat affect Cognition: Difficult to assess Difficult to assess due to: Hard of hearing/deaf Orientation impairments: Time (aware he is at [facility], believes it is 1999)     Attention impairment (select first level of impairment): Sustained attention Executive functioning impairment (select all impairments): Problem solving (difficult to assess) OT - Cognition Comments: Pt HOH, did not do well with following gestural or visual commands when prompted.                 Following commands: Impaired Following commands impaired: Follows one step commands inconsistently, Follows one step commands with increased time      Cueing   Cueing Techniques: Verbal cues, Tactile cues, Gestural cues, Visual cues  Exercises      Shoulder Instructions       General Comments Pt c/o dizziness once EOB, no BP in room to assess vitals. Pt fast asleep upon return to supine at end of session.    Pertinent Vitals/ Pain       Pain Assessment Pain Assessment: PAINAD Breathing: normal Negative Vocalization: none Facial Expression: smiling or inexpressive Body Language: relaxed Consolability: no need to console PAINAD Score: 0 Pain Location: Pt very HOH, did not notice any facial grimacing with movements. Pt unable to state anything about pain when asked. Pain Intervention(s): Monitored during session  Home Living                                          Prior Functioning/Environment               Frequency  Min 2X/week        Progress Toward Goals  OT Goals(current goals can now be found in the care plan section)  Progress towards OT goals: Progressing toward goals  Acute Rehab OT Goals OT Goal Formulation: Patient unable to participate in goal setting Time For Goal Achievement: 08/05/24 Potential to Achieve Goals: Good  Plan      Co-evaluation                 AM-PAC OT 6 Clicks Daily Activity     Outcome Measure   Help from another person eating meals?: Total Help from another person taking care of personal grooming?: A Little Help from another person toileting, which includes using toliet, bedpan, or urinal?: A Lot Help from another person bathing (including washing, rinsing, drying)?: A Lot Help from another person to put on and taking off regular upper body clothing?: Total Help from another person to put on and taking off regular lower body clothing?: Total 6 Click Score: 10    End of Session Equipment Utilized During Treatment: Gait belt  OT Visit Diagnosis: Unsteadiness on feet (R26.81);Muscle weakness (generalized) (M62.81);Other symptoms and signs involving cognitive function   Activity Tolerance Patient tolerated treatment well   Patient Left in bed;with call bell/phone within reach;with bed alarm set;with restraints reapplied (RN reported he would like bilat mitts back to reduce line/lead pulling)   Nurse Communication Mobility status        Time: 8565-8540 OT Time Calculation (min): 25 min  Charges: OT General Charges $OT Visit: 1 Visit OT Treatments $Therapeutic Activity: 8-22 mins  07/24/2024  AB, OTR/L  Acute Rehabilitation Services  Office: [phone]   Curtistine JONETTA Das 07/24/2024, 4:11 PM

## 2024-07-24 NOTE — Plan of Care (Signed)
  Problem: Education: Goal: Ability to describe self-care measures that may prevent or decrease complications (Diabetes Survival Skills Education) will improve Outcome: Progressing Goal: Individualized Educational Video(s) Outcome: Progressing   Problem: Coping: Goal: Ability to adjust to condition or change in health will improve Outcome: Progressing   Problem: Fluid Volume: Goal: Ability to maintain a balanced intake and output will improve Outcome: Progressing   Problem: Health Behavior/Discharge Planning: Goal: Ability to identify and utilize available resources and services will improve Outcome: Progressing Goal: Ability to manage health-related needs will improve Outcome: Progressing   Problem: Metabolic: Goal: Ability to maintain appropriate glucose levels will improve Outcome: Progressing   Problem: Nutritional: Goal: Maintenance of adequate nutrition will improve Outcome: Progressing Goal: Progress toward achieving an optimal weight will improve Outcome: Progressing   Problem: Skin Integrity: Goal: Risk for impaired skin integrity will decrease Outcome: Progressing   Problem: Tissue Perfusion: Goal: Adequacy of tissue perfusion will improve Outcome: Progressing   Problem: Fluid Volume: Goal: Hemodynamic stability will improve Outcome: Progressing   Problem: Clinical Measurements: Goal: Diagnostic test results will improve Outcome: Progressing Goal: Signs and symptoms of infection will decrease Outcome: Progressing   Problem: Respiratory: Goal: Ability to maintain adequate ventilation will improve Outcome: Progressing   Problem: Education: Goal: Knowledge of General Education information will improve Description: Including pain rating scale, medication(s)/side effects and non-pharmacologic comfort measures Outcome: Progressing   Problem: Health Behavior/Discharge Planning: Goal: Ability to manage health-related needs will improve Outcome:  Progressing   Problem: Clinical Measurements: Goal: Ability to maintain clinical measurements within normal limits will improve Outcome: Progressing Goal: Will remain free from infection Outcome: Progressing Goal: Diagnostic test results will improve Outcome: Progressing Goal: Respiratory complications will improve Outcome: Progressing Goal: Cardiovascular complication will be avoided Outcome: Progressing   Problem: Activity: Goal: Risk for activity intolerance will decrease Outcome: Progressing   Problem: Nutrition: Goal: Adequate nutrition will be maintained Outcome: Progressing   Problem: Coping: Goal: Level of anxiety will decrease Outcome: Progressing   Problem: Elimination: Goal: Will not experience complications related to bowel motility Outcome: Progressing Goal: Will not experience complications related to urinary retention Outcome: Progressing   Problem: Pain Managment: Goal: General experience of comfort will improve and/or be controlled Outcome: Progressing   Problem: Safety: Goal: Ability to remain free from injury will improve Outcome: Progressing   Problem: Skin Integrity: Goal: Risk for impaired skin integrity will decrease Outcome: Progressing   Problem: Activity: Goal: Ability to tolerate increased activity will improve Outcome: Progressing   Problem: Clinical Measurements: Goal: Ability to maintain a body temperature in the normal range will improve Outcome: Progressing   Problem: Respiratory: Goal: Ability to maintain adequate ventilation will improve Outcome: Progressing Goal: Ability to maintain a clear airway will improve Outcome: Progressing

## 2024-07-25 DIAGNOSIS — R6520 Severe sepsis without septic shock: Secondary | ICD-10-CM | POA: Diagnosis not present

## 2024-07-25 DIAGNOSIS — E440 Moderate protein-calorie malnutrition: Secondary | ICD-10-CM | POA: Insufficient documentation

## 2024-07-25 DIAGNOSIS — A419 Sepsis, unspecified organism: Secondary | ICD-10-CM | POA: Diagnosis not present

## 2024-07-25 LAB — GLUCOSE, CAPILLARY
Glucose-Capillary: 199 mg/dL — ABNORMAL HIGH (ref 70–99)
Glucose-Capillary: 196 mg/dL — ABNORMAL HIGH (ref 70–99)
Glucose-Capillary: 265 mg/dL — ABNORMAL HIGH (ref 70–99)
Glucose-Capillary: 277 mg/dL — ABNORMAL HIGH (ref 70–99)
Glucose-Capillary: 314 mg/dL — ABNORMAL HIGH (ref 70–99)

## 2024-07-25 MED ORDER — KCL IN DEXTROSE-NACL 20-5-0.9 MEQ/L-%-% IV SOLN
INTRAVENOUS | Status: AC
  Filled 2024-07-25 (×2): qty 1000

## 2024-07-25 NOTE — Progress Notes (Signed)
 Daily Progress Note   Patient Name: Andre Stout       Date: 07/25/2024 DOB: 1940-09-01  Age: 84 y.o. MRN#: [id number] Attending Physician: Andre Leatrice FERNS, MD Primary Care Physician: Andre Leita Repine, FNP Admit Date: 07/21/2024  Reason for Consultation/Follow-up: Establishing goals of care  Length of Stay: 4  Current Medications: Scheduled Meds:   atorvastatin   40 mg Oral Daily   carbamide peroxide  5 drop Both EARS BID   feeding supplement  237 mL Oral BID BM   insulin  aspart  0-9 Units Subcutaneous Q4H   insulin  glargine-yfgn  7 Units Subcutaneous QHS   levETIRAcetam   500 mg Oral BID   Or   levETIRAcetam   500 mg Intravenous BID   multivitamin with minerals  1 tablet Oral Daily   pantoprazole   40 mg Oral Q supper   thiamine   100 mg Oral Daily    Continuous Infusions:  cefTRIAXone  (ROCEPHIN )  IV 2 g (07/25/24 1336)    PRN Meds: acetaminophen  **OR** acetaminophen , albuterol , dextrose , fentaNYL  (SUBLIMAZE ) injection, HYDROcodone -acetaminophen   Physical Exam Vitals reviewed.  Constitutional:      General: He is sleeping. He is not in acute distress. Cardiovascular:     Rate and Rhythm: Normal rate.  Pulmonary:     Effort: Pulmonary effort is normal.             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   Severe sepsis (HCC) 07/21/2024   Aspiration pneumonia (HCC) 07/21/2024    Acute respiratory failure with hypoxia (HCC) 07/21/2024   CKD (chronic kidney disease) stage 3, GFR 30-59 ml/min (HCC) 07/21/2024   Subdural hematoma (HCC) 07/21/2024   Irritant contact dermatitis associated with stoma 03/13/2024   Persistent postoperative fistula 03/13/2024   GERD without esophagitis 03/05/2024   Uncontrolled type 2 diabetes mellitus with hyperglycemia,  with long-term current use of insulin  (HCC) 03/05/2024   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   History of complete heart block 03/05/2024   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   Malnutrition of moderate degree 01/20/2024   Hyperlipidemia 01/17/2024   Leukocytosis 01/17/2024   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 (HCC) 01/17/2024   Acute encephalopathy 01/17/2024   fracture of distal diaphyseal metaphyseal junction of the 5th left metacarpal with dorsal angulation 01/17/2024   Cholecystostomy care (HCC) 01/17/2024   Migration of percutaneous cholecystostomy tube 08/18/2023   Hypertension associated with diabetes (HCC) 08/18/2023   History of stroke 08/18/2023   Mixed 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    Of note, patient has had 2 admissions in the last 6 months.  Today's Discussion: Reviewed chart.  Patient lying in bed nurse tech at bedside taking vitals.  Patient is sleeping and appears to be in no distress.  He does not easily wake up so allowed him to continue sleeping.  No family at bedside.  3:10 pm: Called patient's daughter Andre Stout to discuss goals of care. Patient's daughter shared that patient was a total care patient prior to admission.  Patient's family have medical/nursing background which has allowed them to take care of the patient.  They are allowing time for outcomes and continue to consider their options moving forward. Patient's daughter shared they are leaning towards rehab but have not made a final decision yet. They have considered code status and would not want the patient to receive CPR or be intubated. Changed patient to DNR/DNI.  Questions and concerns addressed. Emotional support and therapeutic listening provided. Encouraged patient's family to call PMT with questions or needs. Will continue to support.  Recommendations/Plan: Changed to DNR/DNI Continue treating the treatable Family still considering options at discharge but leaning towards rehab Continued PMT support    Code Status:    Code Status Orders  (From admission, onward)           Start     Ordered   07/25/24 1517  Do not attempt resuscitation (DNR)- Limited -Do Not Intubate (DNI)  (Code Status)  Continuous       Question Answer Comment  If pulseless and not breathing No CPR or chest compressions.   In Pre-Arrest Conditions (Patient Is Breathing and Has A Pulse) Do not intubate. Provide all appropriate non-invasive medical interventions. Avoid ICU transfer unless indicated or required.   Consent: Discussion documented in EHR or  advanced directives reviewed      07/25/24 1516         Extensive chart review has been completed prior to seeing the patient including  labs, vital signs, imaging, progress/consult notes, orders, medications, and available advance directive documents.  Care plan was discussed with bedside RN and Dr. Rosario  Time spent: 35 minutes  Thank you for allowing the Palliative Medicine Team to assist in the care of this patient.   Andre CHRISTELLA Palin, NP  Please contact Palliative Medicine Team phone at [phone] for questions and concerns.

## 2024-07-25 NOTE — Progress Notes (Signed)
 Nutrition Follow-up  DOCUMENTATION CODES:   Non-severe (moderate) malnutrition in context of chronic illness  INTERVENTION:   - Initiate 48-hour calorie count today at 1200 with lunch meal, unit RD to follow up with results on Monday, 07/27/24.  - Continue Ensure Plus High Protein po BID, each supplement provides 350 kcal and 20 grams of protein  - With dysphagia 1 diet order and automatic meal trays, pt currently receiving Magic Cup BID with lunch and dinner meals. Will add additional Magic Cup with breakfast meal, each supplement provides 290 kcal and 9 grams of protein.  - Continue MVI with minerals daily  - Continue thiamine  100 mg daily x 5 days  NUTRITION DIAGNOSIS:   Moderate Malnutrition related to chronic illness as evidenced by mild fat depletion, moderate muscle depletion.  Ongoing, being addressed via oral nutrition supplements  GOAL:   Patient will meet greater than or equal to 90% of their needs  Progressing  MONITOR:   PO intake, Supplement acceptance  REASON FOR ASSESSMENT:   Consult Assessment of nutrition requirement/status  ASSESSMENT:   Pt with hx of type 2 diabetes, DKA, cholecystitis (s/p cholecystostomy tube 2023 and percutaneous biliary drain placement), and HTN. Pt admitted with AMS, weakness and hyperglycemia, diagnosed sepsis secondary to community acquired pna, aspiration pna, and acute UTI.  7/22 - admitted 7/23 - SLP eval, recommended DYS 1  RD working remotely. Unable to speak with pt at this time.  RD received consult for assessment and to initiate calorie count. Discussed with RN who states calorie count has not yet been started. Will plan to initiate calorie count today at 1200 with lunch meal. Appreciate RN assistance with hanging calorie count envelope on pt's door. RD has updated calorie count order with instructions. Have also placed nursing care order for calorie count.  Meal completions have been averaging 68% over the last 2  days.  Pt currently receiving Ensure Plus High Protein BID between meals. With dysphagia 1 diet order and automatic meal trays, pt also receiving Magic Cup BID with lunch and dinner meals. Will add additional Magic Cup with breakfast meal.  Continue thiamine  100 mg x 5 days for refeeding risk as well as daily MVI with minerals.  Meal Completion: 40-95% x last 6 documented meals, averaging 68%  Medications reviewed and include: Ensure Plus High Protein BID, SSI every 4 hours, semglee  8 units daily, MVI with minerals, protonix , thiamine  100 mg x 5 days, IV abx  Labs reviewed: creatinine 1.28 on 7/25, hemoglobin 9.1 on 7/25 CBG's: 168-258 x 24 hours  Cholecystostomy: 75 mL x 24 hours  Diet Order:   Diet Order             DIET - DYS 1 Room service appropriate? No; Fluid consistency: Thin  Diet effective now                   EDUCATION NEEDS:   No education needs have been identified at this time  Skin:  Skin Assessment: Reviewed RN Assessment  Last BM:  7/24 type 5  Height:   Ht Readings from Last 1 Encounters:  07/21/24 5' (1.524 m)    Weight:   Wt Readings from Last 1 Encounters:  07/21/24 72.6 kg    Ideal Body Weight:  61.8 kg (based on ht being 5'5)  BMI:  Body mass index is 31.25 kg/m.  Estimated Nutritional Needs:   Kcal:  1700-1900g  Protein:  75-90g  Fluid:  1.7-1.9L    Mallie Satchel,  MS, RD, LDN Registered Dietitian II Please see [facility] for contact information.

## 2024-07-25 NOTE — Progress Notes (Signed)
 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 Family reported that patient had fallen about a week prior to admission.  Family noticed patient had developed nausea and vomiting few days prior to admission he was becoming weaker and confused.  Family brought him to the ED because they thought he might have another DKA..  In the ED, he was febrile with temperature up to 102.3 F, tachypneic with respiratory rate up to 25, heart rate 88, BP 107/68 and O2 sat 97% on 2 L of oxygen  Initial glucose on admission was 415.  Subsequently, patient developed hypoglycemia with glucose dropping to 61.  Lactic acid was 5.4 but subsequently came down to 1.6.  CT chest abdomen and pelvis suspicious for right lung pneumonia possibly aspiration.  Moderate compression fracture deformity at L1, moderate nondependent causing the blood concerning for cystitis versus instrumentation also noted on imaging.  CT head without contrast: IMPRESSION: 1. Acute left posterior convexity subdural hematoma, measuring up to 8 mm, without midline shift.  07/24/2024: Blood cultures growing Klebsiella pneumonia.  Antibiotics will be changed to IV Rocephin  2 g daily. 07/25/2024: Patient seen.  Patient is slowly improving.  Poor p.o. intake persists.  Will restart IV fluids.   Assessment/Plan:   Principal Problem:   Severe sepsis (HCC) Active Problems:   Benign essential HTN   Prostate cancer (HCC)   Prolonged QT interval   Heart block AV complete (HCC)   History of stroke   Hyperlipidemia    Acute encephalopathy   Uncontrolled type 2 diabetes mellitus with hyperglycemia, with long-term current use of insulin  (HCC)   Mobitz type 1 second degree atrioventricular block   Aspiration pneumonia (HCC)   Acute respiratory failure with hypoxia (HCC)   CKD (chronic kidney disease) stage 3, GFR 30-59 ml/min (HCC)   Subdural hematoma (HCC)   L1 vertebral fracture (HCC)   Biliary fistula   Moderate malnutrition (HCC)    Body mass index is 31.25 kg/m.  (Class I obesity)    Severe sepsis secondary to community-acquired pneumonia, probable right aspiration pneumonia, acute UTI: Urine culture showed insignificant growth. Blood culture showed Staph capitis in an aerobic bottle only (1 out of 4 culture bottles).  This is likely a contaminant. Continue IV Unasyn .  IV vancomycin  has been discontinued. MRSA screen was negative. 07/24/2024: Change antibiotics to IV Rocephin  2 g daily.  Blood cultures growing Klebsiella pneumonia. 07/25/2024: Continue IV Rocephin .  Hypotension:  -Resolved.    Acute kidney injury: - Slowly resolving. - Serum creatinine of 1.28 today. - Continue to avoid nephrotoxins. - Keep MAP greater than 65 mmHg. 07/25/2024: Renal panel in the morning.  Acute metabolic encephalopathy: - Continue supportive care. -As per prior documentation suspected dementia: His daughter said he stays confused and has been like this for over 2 years and his confusion has actually been worsening.  She suspects he has dementia although he has not been formally diagnosed with it. 07/25/2024: No behavioral problems.   Acute left-sided subdural hematoma (8 mm) without midline shift: Repeat CT head showed stability of SDH, measuring 6 mm.  He has been evaluated  by neurosurgery team. Conservative management for now.  Plavix  to be held for 7 to 10 days.   Prophylactic Keppra  500 mg twice daily for up to 7 days maximum. Outpatient follow-up with neurosurgeon. Recent fall prior to  admission: PT and OT recommended discharge to SNF   Type II DM with hyperglycemia, hypoglycemia: She was hyperglycemic on presentation with glucose of 415.  Glucose dropped to 61 on 07/21/2024 at 4:30 AM. Decrease Lantus  from 10 units to 7 units nightly. Use NovoLog  as needed for hyperglycemia. Adjust insulin  as needed. 07/24/2024: Start sensitive dose sliding scale insulin  coverage.   Hypomagnesemia: Magnesium  is still low at 1.6.  Replete with IV magnesium  sulfate. Hypophosphatemia: Improved  Hypokalemia: Replete potassium and monitor level 07/24/2024: Magnesium  of 1.8 today.  Potassium of 3.8 today.  Phosphorus of 3 today.   Prolonged QTc interval (517): Replete electrolytes and monitor QTc. 07/24/2024: Repeat EKG.   L1 compression fracture: TLSO brace recommended.  Moderate malnutrition: - Albumin  of 1.8. - Consult dietary team. - Caloric count.   Comorbidities include History of complete intermittent heart block followed by cardiologist, History of cholecystitis s/p cholecystostomy tube placement in 2023, s/p percutaneous biliary drain, hypertension, history of stroke (was on aspirin  and Plavix  prior to admission), hyperlipidemia, prostate cancer   Consultants: Neurosurgeon  Procedures: None    Medications:    atorvastatin   40 mg Oral Daily   carbamide peroxide  5 drop Both EARS BID   feeding supplement  237 mL Oral BID BM   insulin  aspart  0-9 Units Subcutaneous Q4H   insulin  glargine-yfgn  7 Units Subcutaneous QHS   levETIRAcetam   500 mg Oral BID   Or   levETIRAcetam   500 mg Intravenous BID   multivitamin with minerals  1 tablet Oral Daily   pantoprazole   40 mg Oral Q supper   thiamine   100 mg Oral Daily   Continuous Infusions:  cefTRIAXone  (ROCEPHIN )  IV 2 g (07/25/24 1336)   dextrose  5 % and 0.9 % NaCl with KCl 20 mEq/L 75 mL/hr at 07/25/24 1636     Anti-infectives (From admission, onward)    Start     Dose/Rate Route Frequency Ordered Stop    07/24/24 1430  cefTRIAXone  (ROCEPHIN ) 2 g in sodium chloride  0.9 % 100 mL IVPB        2 g 200 mL/hr over 30 Minutes Intravenous Every 24 hours 07/24/24 1334     07/22/24 1446  vancomycin  variable dose per unstable renal function (pharmacist dosing)  Status:  Discontinued         Does not apply See admin instructions 07/22/24 1446 07/22/24 1600   07/22/24 0400  Ampicillin -Sulbactam (UNASYN ) 3 g in sodium chloride  0.9 % 100 mL IVPB  Status:  Discontinued        3 g 200 mL/hr over 30 Minutes Intravenous Every 6 hours 07/21/24 2359 07/24/24 1334   07/22/24 0000  vancomycin  (VANCOREADY) IVPB 500 mg/100 mL        500 mg 100 mL/hr over 60 Minutes Intravenous  Once 07/21/24 2359 07/22/24 0122   07/21/24 1830  ceFEPIme  (MAXIPIME ) 2 g in sodium chloride  0.9 % 100 mL IVPB        2 g 200 mL/hr over 30 Minutes Intravenous  Once 07/21/24 1818 07/21/24 1939   07/21/24 1830  metroNIDAZOLE  (FLAGYL ) IVPB 500 mg        500 mg 100 mL/hr over 60 Minutes Intravenous  Once 07/21/24 1818 07/21/24 2055   07/21/24 1830  vancomycin  (  VANCOCIN ) IVPB 1000 mg/200 mL premix        1,000 mg 200 mL/hr over 60 Minutes Intravenous  Once 07/21/24 1818 07/21/24 2140              Family Communication/Anticipated D/C date and plan/Code Status   DVT prophylaxis: SCDs Start: 07/22/24 0029     Code Status: Limited: Do not attempt resuscitation (DNR) -DNR-LIMITED -Do Not Intubate/DNI   Family Communication: Plan discussed with Michaelle, daughter, over the phone. Disposition Plan: Plan to discharge to SNF   Status is: Inpatient Remains inpatient appropriate because: Severe sepsis   Subjective:   No significant history from patient.  Objective:    Vitals:   07/25/24 0325 07/25/24 0900 07/25/24 1142 07/25/24 1503  BP: 110/63 112/63 (!) 117/54 (!) 113/57  Pulse: 62 60 65 67  Resp: 18  20 20   Temp: 99.2 F (37.3 C) 98 F (36.7 C) 98 F (36.7 C) 98.7 F (37.1 C)  TempSrc: Oral Oral Oral Oral  SpO2: 95%   91% 95%  Weight:      Height:       No data found.   Intake/Output Summary (Last 24 hours) at 07/25/2024 1636 Last data filed at 07/25/2024 1300 Gross per 24 hour  Intake 840 ml  Output 425 ml  Net 415 ml   Filed Weights   07/21/24 2343  Weight: 72.6 kg    Exam:   GEN: Not in any distress.  Awake.  Mittens to both hands.   HEENT: Patient is pale.  Dry buccal mucosa. Neck: Supple. Lungs: Clear to auscultation.   CVS: S1-S2. Neuro: Awake and alert. Extremities no leg edema.  Data Reviewed:   I have personally reviewed following labs and imaging studies:  Labs: Labs show the following:   Basic Metabolic Panel: Recent Labs  Lab 07/21/24 1800 07/21/24 1837 07/22/24 0120 07/23/24 0312 07/24/24 0318  NA 132* 134*  133* 134* 138 140  K 4.0 3.8  3.8 4.1 3.4* 3.8  CL 97* 98 102 103 104  CO2 22  --  21* 23 26  GLUCOSE 382* 390* 94 104* 121*  BUN 19 20 18 13 17   CREATININE 1.68* 1.50* 1.26* 1.19 1.28*  CALCIUM  9.4  --  8.7* 9.1 8.8*  MG  --   --  1.5* 1.6* 1.8  PHOS  --   --  2.1* 2.5 3.0   GFR Estimated Creatinine Clearance: 36.5 mL/min (A) (by C-G formula based on SCr of 1.28 mg/dL (H)). Liver Function Tests: Recent Labs  Lab 07/21/24 1800 07/22/24 0120 07/23/24 0312 07/24/24 0318  AST 19 24  --   --   ALT 8 7  --   --   ALKPHOS 92 69  --   --   BILITOT 1.1 1.4*  --   --   PROT 6.5 5.4*  --   --   ALBUMIN  2.2* 1.8* 2.0* 1.8*   Recent Labs  Lab 07/21/24 1800  LIPASE 21   No results for input(s): AMMONIA in the last 168 hours. Coagulation profile Recent Labs  Lab 07/21/24 1800  INR 1.3*    CBC: Recent Labs  Lab 07/21/24 1800 07/21/24 1837 07/22/24 0120 07/22/24 0426 07/23/24 0312 07/24/24 0318  WBC 19.8*  --  18.5* 16.9* 13.7* 10.4  NEUTROABS 18.0*  --   --   --   --   --   HGB 10.9* 11.6*  12.2* 9.1* 9.2* 9.7* 9.1*  HCT 33.9* 34.0*  36.0* 29.0* 29.1*  30.3* 28.7*  MCV 77.0*  --  78.8* 78.0* 77.9* 77.8*  PLT 464*  --  355 362  374 391   Cardiac Enzymes: Recent Labs  Lab 07/22/24 0120  CKTOTAL 259   BNP (last 3 results) No results for input(s): PROBNP in the last 8760 hours. CBG: Recent Labs  Lab 07/24/24 2356 07/25/24 0326 07/25/24 0755 07/25/24 1138 07/25/24 1547  GLUCAP 200* 199* 196* 265* 277*   D-Dimer: No results for input(s): DDIMER in the last 72 hours. Hgb A1c: No results for input(s): HGBA1C in the last 72 hours.  Lipid Profile: No results for input(s): CHOL, HDL, LDLCALC, TRIG, CHOLHDL, LDLDIRECT in the last 72 hours. Thyroid  function studies: No results for input(s): TSH, T4TOTAL, T3FREE, THYROIDAB in the last 72 hours.  Invalid input(s): FREET3  Anemia work up: No results for input(s): VITAMINB12, FOLATE, FERRITIN, TIBC, IRON, RETICCTPCT in the last 72 hours.  Sepsis Labs: Recent Labs  Lab 07/21/24 2000 07/22/24 0120 07/22/24 0122 07/22/24 0426 07/22/24 1600 07/23/24 0312 07/24/24 0318  PROCALCITON  --  7.76  --   --   --   --   --   WBC  --  18.5*  --  16.9*  --  13.7* 10.4  LATICACIDVEN 4.0*  --  2.5* 1.6 1.8  --   --     Microbiology Recent Results (from the past 240 hours)  Culture, blood (Routine x 2)     Status: Abnormal (Preliminary result)   Collection Time: 07/21/24  6:00 PM   Specimen: BLOOD  Result Value Ref Range Status   Specimen Description BLOOD RIGHT ANTECUBITAL  Final   Special Requests   Final    BOTTLES DRAWN AEROBIC AND ANAEROBIC Blood Culture adequate volume   Culture  Setup Time   Final    GRAM NEGATIVE RODS AEROBIC BOTTLE ONLY CRITICAL RESULT CALLED TO, READ BACK BY AND VERIFIED WITH: PHARMD MADELINE MITCHELL [id number] AT 1316 BY EC    Culture (A)  Final    KLEBSIELLA PNEUMONIAE SUSCEPTIBILITIES TO FOLLOW Performed at [facility], [address] [place], [place] [address]    Report Status PENDING  Incomplete  Blood Culture ID Panel (Reflexed)     Status: Abnormal   Collection Time:  07/21/24  6:00 PM  Result Value Ref Range Status   Enterococcus faecalis NOT DETECTED NOT DETECTED Final   Enterococcus Faecium NOT DETECTED NOT DETECTED Final   Listeria monocytogenes NOT DETECTED NOT DETECTED Final   Staphylococcus species NOT DETECTED NOT DETECTED Final   Staphylococcus aureus (BCID) NOT DETECTED NOT DETECTED Final   Staphylococcus epidermidis NOT DETECTED NOT DETECTED Final   Staphylococcus lugdunensis NOT DETECTED NOT DETECTED Final   Streptococcus species NOT DETECTED NOT DETECTED Final   Streptococcus agalactiae NOT DETECTED NOT DETECTED Final   Streptococcus pneumoniae NOT DETECTED NOT DETECTED Final   Streptococcus pyogenes NOT DETECTED NOT DETECTED Final   A.calcoaceticus-baumannii NOT DETECTED NOT DETECTED Final   Bacteroides fragilis NOT DETECTED NOT DETECTED Final   Enterobacterales DETECTED (A) NOT DETECTED Final    Comment: Enterobacterales represent a large order of gram negative bacteria, not a single organism. CRITICAL RESULT CALLED TO, READ BACK BY AND VERIFIED WITH: PHARMD MADELINE MITCHELL [id number] AT 1316 BY EC    Enterobacter cloacae complex NOT DETECTED NOT DETECTED Final   Escherichia coli NOT DETECTED NOT DETECTED Final   Klebsiella aerogenes NOT DETECTED NOT DETECTED Final   Klebsiella oxytoca NOT DETECTED NOT DETECTED Final   Klebsiella pneumoniae  DETECTED (A) NOT DETECTED Final    Comment: CRITICAL RESULT CALLED TO, READ BACK BY AND VERIFIED WITH: PHARMD MADELINE MITCHELL [phone] AT 1316 BY EC    Proteus species NOT DETECTED NOT DETECTED Final   Salmonella species NOT DETECTED NOT DETECTED Final   Serratia marcescens NOT DETECTED NOT DETECTED Final   Haemophilus influenzae NOT DETECTED NOT DETECTED Final   Neisseria meningitidis NOT DETECTED NOT DETECTED Final   Pseudomonas aeruginosa NOT DETECTED NOT DETECTED Final   Stenotrophomonas maltophilia NOT DETECTED NOT DETECTED Final   Candida albicans NOT DETECTED NOT DETECTED Final    Candida auris NOT DETECTED NOT DETECTED Final   Candida glabrata NOT DETECTED NOT DETECTED Final   Candida krusei NOT DETECTED NOT DETECTED Final   Candida parapsilosis NOT DETECTED NOT DETECTED Final   Candida tropicalis NOT DETECTED NOT DETECTED Final   Cryptococcus neoformans/gattii NOT DETECTED NOT DETECTED Final   CTX-M ESBL NOT DETECTED NOT DETECTED Final   Carbapenem resistance IMP NOT DETECTED NOT DETECTED Final   Carbapenem resistance KPC NOT DETECTED NOT DETECTED Final   Carbapenem resistance NDM NOT DETECTED NOT DETECTED Final   Carbapenem resist OXA 48 LIKE NOT DETECTED NOT DETECTED Final   Carbapenem resistance VIM NOT DETECTED NOT DETECTED Final    Comment: Performed at [facility], [address] [place], [place] [address]  Culture, blood (Routine x 2)     Status: Abnormal   Collection Time: 07/21/24  6:10 PM   Specimen: BLOOD LEFT ARM  Result Value Ref Range Status   Specimen Description BLOOD LEFT ARM  Final   Special Requests   Final    BOTTLES DRAWN AEROBIC AND ANAEROBIC Blood Culture adequate volume   Culture  Setup Time   Final    GRAM POSITIVE COCCI IN CLUSTERS ANAEROBIC BOTTLE ONLY CRITICAL RESULT CALLED TO, READ BACK BY AND VERIFIED WITH: PHARMD LISA CURRAN ON 07/22/24 @ 2011 BY DRT    Culture (A)  Final    STAPHYLOCOCCUS CAPITIS THE SIGNIFICANCE OF ISOLATING THIS ORGANISM FROM A SINGLE SET OF BLOOD CULTURES WHEN MULTIPLE SETS ARE DRAWN IS UNCERTAIN. PLEASE NOTIFY THE MICROBIOLOGY DEPARTMENT WITHIN ONE WEEK IF SPECIATION AND SENSITIVITIES ARE REQUIRED. Performed at [facility], [address], [place], [place] [address]    Report Status 07/24/2024 FINAL  Final  Blood Culture ID Panel (Reflexed)     Status: Abnormal   Collection Time: 07/21/24  6:10 PM  Result Value Ref Range Status   Enterococcus faecalis NOT DETECTED NOT DETECTED Final   Enterococcus Faecium NOT DETECTED NOT DETECTED Final   Listeria monocytogenes NOT DETECTED NOT DETECTED  Final   Staphylococcus species DETECTED (A) NOT DETECTED Final    Comment: CRITICAL RESULT CALLED TO, READ BACK BY AND VERIFIED WITH: PHARMD LISA CURRAN ON 07/22/24 @ 2011 BY DRT    Staphylococcus aureus (BCID) NOT DETECTED NOT DETECTED Final   Staphylococcus epidermidis NOT DETECTED NOT DETECTED Final   Staphylococcus lugdunensis NOT DETECTED NOT DETECTED Final   Streptococcus species NOT DETECTED NOT DETECTED Final   Streptococcus agalactiae NOT DETECTED NOT DETECTED Final   Streptococcus pneumoniae NOT DETECTED NOT DETECTED Final   Streptococcus pyogenes NOT DETECTED NOT DETECTED Final   A.calcoaceticus-baumannii NOT DETECTED NOT DETECTED Final   Bacteroides fragilis NOT DETECTED NOT DETECTED Final   Enterobacterales NOT DETECTED NOT DETECTED Final   Enterobacter cloacae complex NOT DETECTED NOT DETECTED Final   Escherichia coli NOT DETECTED NOT DETECTED Final   Klebsiella aerogenes NOT  DETECTED NOT DETECTED Final   Klebsiella oxytoca NOT DETECTED NOT DETECTED Final   Klebsiella pneumoniae NOT DETECTED NOT DETECTED Final   Proteus species NOT DETECTED NOT DETECTED Final   Salmonella species NOT DETECTED NOT DETECTED Final   Serratia marcescens NOT DETECTED NOT DETECTED Final   Haemophilus influenzae NOT DETECTED NOT DETECTED Final   Neisseria meningitidis NOT DETECTED NOT DETECTED Final   Pseudomonas aeruginosa NOT DETECTED NOT DETECTED Final   Stenotrophomonas maltophilia NOT DETECTED NOT DETECTED Final   Candida albicans NOT DETECTED NOT DETECTED Final   Candida auris NOT DETECTED NOT DETECTED Final   Candida glabrata NOT DETECTED NOT DETECTED Final   Candida krusei NOT DETECTED NOT DETECTED Final   Candida parapsilosis NOT DETECTED NOT DETECTED Final   Candida tropicalis NOT DETECTED NOT DETECTED Final   Cryptococcus neoformans/gattii NOT DETECTED NOT DETECTED Final    Comment: Performed at [facility], [address] [place], [place] [address]  Resp panel by RT-PCR  (RSV, Flu A&B, Covid) Anterior Nasal Swab     Status: None   Collection Time: 07/21/24  6:17 PM   Specimen: Anterior Nasal Swab  Result Value Ref Range Status   SARS Coronavirus 2 by RT PCR NEGATIVE NEGATIVE Final   Influenza A by PCR NEGATIVE NEGATIVE Final   Influenza B by PCR NEGATIVE NEGATIVE Final    Comment: (NOTE) The Xpert Xpress SARS-CoV-2/FLU/RSV plus assay is intended as an aid in the diagnosis of influenza from Nasopharyngeal swab specimens and should not be used as a sole basis for treatment. Nasal washings and aspirates are unacceptable for Xpert Xpress SARS-CoV-2/FLU/RSV testing.  Fact Sheet for Patients: BloggerCourse.com  Fact Sheet for Healthcare Providers: SeriousBroker.it  This test is not yet approved or cleared by the [place]  FDA and has been authorized for detection and/or diagnosis of SARS-CoV-2 by FDA under an Emergency Use Authorization (EUA). This EUA will remain in effect (meaning this test can be used) for the duration of the COVID-19 declaration under Section 564(b)(1) of the Act, 21 U.S.C. section 360bbb-3(b)(1), unless the authorization is terminated or revoked.     Resp Syncytial Virus by PCR NEGATIVE NEGATIVE Final    Comment: (NOTE) Fact Sheet for Patients: BloggerCourse.com  Fact Sheet for Healthcare Providers: SeriousBroker.it  This test is not yet approved or cleared by the [place]  FDA and has been authorized for detection and/or diagnosis of SARS-CoV-2 by FDA under an Emergency Use Authorization (EUA). This EUA will remain in effect (meaning this test can be used) for the duration of the COVID-19 declaration under Section 564(b)(1) of the Act, 21 U.S.C. section 360bbb-3(b)(1), unless the authorization is terminated or revoked.  Performed at [facility], [address] [place], [place] [address]   Urine Culture (for  pregnant, neutropenic or urologic patients or patients with an indwelling urinary catheter)     Status: Abnormal   Collection Time: 07/22/24  4:26 AM   Specimen: Urine, Clean Catch  Result Value Ref Range Status   Specimen Description URINE, CLEAN CATCH  Final   Special Requests NONE  Final   Culture (A)  Final    <10,000 COLONIES/mL INSIGNIFICANT GROWTH Performed at [facility], [address] [place], [place] [address]    Report Status 07/23/2024 FINAL  Final  MRSA Next Gen by PCR, Nasal     Status: None   Collection Time: 07/22/24  4:26 AM   Specimen: Urine, Clean Catch; Nasal Swab  Result Value Ref  Range Status   MRSA by PCR Next Gen NOT DETECTED NOT DETECTED Final    Comment: (NOTE) The GeneXpert MRSA Assay (FDA approved for NASAL specimens only), is one component of a comprehensive MRSA colonization surveillance program. It is not intended to diagnose MRSA infection nor to guide or monitor treatment for MRSA infections. Test performance is not FDA approved in patients less than 19 years old. Performed at [facility], [address] [place], [place] [address]     Procedures and diagnostic studies:  No results found.   Time spent: 55 minutes.    LOS: 4 days   Leatrice LILLETTE Chapel  [facility]   Pager on [email or web address]. If 7PM-7AM, please contact night-coverage at [email or web address]     07/25/2024, 4:36 PM

## 2024-07-26 DIAGNOSIS — R6520 Severe sepsis without septic shock: Secondary | ICD-10-CM | POA: Diagnosis not present

## 2024-07-26 DIAGNOSIS — A419 Sepsis, unspecified organism: Secondary | ICD-10-CM | POA: Diagnosis not present

## 2024-07-26 LAB — GLUCOSE, CAPILLARY
Glucose-Capillary: 200 mg/dL — ABNORMAL HIGH (ref 70–99)
Glucose-Capillary: 241 mg/dL — ABNORMAL HIGH (ref 70–99)
Glucose-Capillary: 241 mg/dL — ABNORMAL HIGH (ref 70–99)
Glucose-Capillary: 252 mg/dL — ABNORMAL HIGH (ref 70–99)
Glucose-Capillary: 336 mg/dL — ABNORMAL HIGH (ref 70–99)
Glucose-Capillary: 346 mg/dL — ABNORMAL HIGH (ref 70–99)
Glucose-Capillary: 359 mg/dL — ABNORMAL HIGH (ref 70–99)

## 2024-07-26 LAB — CULTURE, BLOOD (ROUTINE X 2): Special Requests: ADEQUATE

## 2024-07-26 NOTE — Progress Notes (Signed)
 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 Family reported that patient had fallen about a week prior to admission.  Family noticed patient had developed nausea and vomiting few days prior to admission he was becoming weaker and confused.  Family brought him to the ED because they thought he might have another DKA..  In the ED, he was febrile with temperature up to 102.3 F, tachypneic with respiratory rate up to 25, heart rate 88, BP 107/68 and O2 sat 97% on 2 L of oxygen  Initial glucose on admission was 415.  Subsequently, patient developed hypoglycemia with glucose dropping to 61.  Lactic acid was 5.4 but subsequently came down to 1.6.  CT chest abdomen and pelvis suspicious for right lung pneumonia possibly aspiration.  Moderate compression fracture deformity at L1, moderate nondependent causing the blood concerning for cystitis versus instrumentation also noted on imaging.  CT head without contrast: IMPRESSION: 1. Acute left posterior convexity subdural hematoma, measuring up to 8 mm, without midline shift.  07/24/2024: Blood cultures growing Klebsiella pneumonia.  Antibiotics will be changed to IV Rocephin  2 g daily.  07/26/2024: Patient seen.  Patient continues to improve very slowly.  No significant history from patient.     Assessment/Plan:   Principal Problem:   Severe sepsis (HCC) Active Problems:   Benign essential HTN   Prostate cancer (HCC)   Prolonged QT interval   Heart block AV complete (HCC)   History of stroke   Hyperlipidemia    Acute encephalopathy   Uncontrolled type 2 diabetes mellitus with hyperglycemia, with long-term current use of insulin  (HCC)   Mobitz type 1 second degree atrioventricular block   Aspiration pneumonia (HCC)   Acute respiratory failure with hypoxia (HCC)   CKD (chronic kidney disease) stage 3, GFR 30-59 ml/min (HCC)   Subdural hematoma (HCC)   L1 vertebral fracture (HCC)   Biliary fistula   Moderate malnutrition (HCC)    Body mass index is 31.25 kg/m.  (Class I obesity)    Severe sepsis secondary to community-acquired pneumonia, probable right aspiration pneumonia, acute UTI: Urine culture showed insignificant growth. Blood culture showed Staph capitis in an aerobic bottle only (1 out of 4 culture bottles).  This is likely a contaminant. Continue IV Unasyn .  IV vancomycin  has been discontinued. MRSA screen was negative. 07/24/2024: Change antibiotics to IV Rocephin  2 g daily.  Blood cultures growing Klebsiella pneumonia. 07/26/2024: Continue IV Rocephin .  Hypotension:  -Resolved.    Acute kidney injury: - Slowly resolving. - Serum creatinine of 1.28 today. - Continue to avoid nephrotoxins. - Keep MAP greater than 65 mmHg. 07/26/2024: Renal panel in the morning.  Acute metabolic encephalopathy: - Continue supportive care. -As per prior documentation suspected dementia: His daughter said he stays confused and has been like this for over 2 years and his confusion has actually been worsening.  She suspects he has dementia although he has not been formally diagnosed with it. 07/26/2024: No behavioral problems.   Acute left-sided subdural hematoma (8 mm) without midline shift: Repeat CT head showed stability of SDH, measuring 6 mm.  He has been  evaluated by neurosurgery team. Conservative management for now.  Plavix  to be held for 7 to 10 days.   Prophylactic Keppra  500 mg twice daily for up to 7 days maximum. Outpatient follow-up with neurosurgeon. Recent fall prior to  admission: PT and OT recommended discharge to SNF   Type II DM with hyperglycemia, hypoglycemia: She was hyperglycemic on presentation with glucose of 415.  Glucose dropped to 61 on 07/21/2024 at 4:30 AM. Decrease Lantus  from 10 units to 7 units nightly. Use NovoLog  as needed for hyperglycemia. Adjust insulin  as needed. 07/24/2024: Start sensitive dose sliding scale insulin  coverage.   Hypomagnesemia: Magnesium  is still low at 1.6.  Replete with IV magnesium  sulfate. Hypophosphatemia: Improved  Hypokalemia: Replete potassium and monitor level 07/24/2024: Magnesium  of 1.8 today.  Potassium of 3.8 today.  Phosphorus of 3 today.   Prolonged QTc interval (517): Replete electrolytes and monitor QTc. 07/24/2024: Repeat EKG.   L1 compression fracture: TLSO brace recommended.  Moderate malnutrition: - Albumin  of 1.8. - Consult dietary team. - Caloric count.   Comorbidities include History of complete intermittent heart block followed by cardiologist, History of cholecystitis s/p cholecystostomy tube placement in 2023, s/p percutaneous biliary drain, hypertension, history of stroke (was on aspirin  and Plavix  prior to admission), hyperlipidemia, prostate cancer   Consultants: Neurosurgery  Procedures: None    Medications:    atorvastatin   40 mg Oral Daily   feeding supplement  237 mL Oral BID BM   insulin  aspart  0-9 Units Subcutaneous Q4H   insulin  glargine-yfgn  7 Units Subcutaneous QHS   levETIRAcetam   500 mg Oral BID   Or   levETIRAcetam   500 mg Intravenous BID   multivitamin with minerals  1 tablet Oral Daily   pantoprazole   40 mg Oral Q supper   thiamine   100 mg Oral Daily   Continuous Infusions:  cefTRIAXone  (ROCEPHIN )  IV 2 g (07/26/24 1330)     Anti-infectives (From admission, onward)    Start     Dose/Rate Route Frequency Ordered Stop   07/24/24 1430  cefTRIAXone  (ROCEPHIN ) 2 g in sodium chloride  0.9 % 100 mL IVPB        2 g 200 mL/hr over 30 Minutes  Intravenous Every 24 hours 07/24/24 1334     07/22/24 1446  vancomycin  variable dose per unstable renal function (pharmacist dosing)  Status:  Discontinued         Does not apply See admin instructions 07/22/24 1446 07/22/24 1600   07/22/24 0400  Ampicillin -Sulbactam (UNASYN ) 3 g in sodium chloride  0.9 % 100 mL IVPB  Status:  Discontinued        3 g 200 mL/hr over 30 Minutes Intravenous Every 6 hours 07/21/24 2359 07/24/24 1334   07/22/24 0000  vancomycin  (VANCOREADY) IVPB 500 mg/100 mL        500 mg 100 mL/hr over 60 Minutes Intravenous  Once 07/21/24 2359 07/22/24 0122   07/21/24 1830  ceFEPIme  (MAXIPIME ) 2 g in sodium chloride  0.9 % 100 mL IVPB        2 g 200 mL/hr over 30 Minutes Intravenous  Once 07/21/24 1818 07/21/24 1939   07/21/24 1830  metroNIDAZOLE  (FLAGYL ) IVPB 500 mg        500 mg 100 mL/hr over 60 Minutes Intravenous  Once 07/21/24 1818 07/21/24 2055   07/21/24 1830  vancomycin  (VANCOCIN ) IVPB 1000 mg/200 mL premix        1,000 mg 200 mL/hr over 60 Minutes Intravenous  Once 07/21/24 1818 07/21/24 2140  Family Communication/Anticipated D/C date and plan/Code Status   DVT prophylaxis: SCDs Start: 07/22/24 0029     Code Status: Limited: Do not attempt resuscitation (DNR) -DNR-LIMITED -Do Not Intubate/DNI   Family Communication: Plan discussed with Michaelle, daughter, over the phone. Disposition Plan: Plan to discharge to SNF   Status is: Inpatient Remains inpatient appropriate because: Severe sepsis   Subjective:   No significant history from patient.  Objective:    Vitals:   07/26/24 0416 07/26/24 0800 07/26/24 1149 07/26/24 1540  BP: (!) 128/58 133/65 138/64 132/63  Pulse: 66 64 70 70  Resp:  18 16 17   Temp: 98.4 F (36.9 C) 97.8 F (36.6 C) (!) 97.5 F (36.4 C) 97.6 F (36.4 C)  TempSrc: Oral Oral Oral Oral  SpO2: 94% 95% 95% 95%  Weight:      Height:       No data found.   Intake/Output Summary (Last 24 hours) at 07/26/2024  1856 Last data filed at 07/26/2024 0914 Gross per 24 hour  Intake 540 ml  Output 400 ml  Net 140 ml   Filed Weights   07/21/24 2343  Weight: 72.6 kg    Exam:   GEN: Not in any distress.  Awake.  Mittens to both hands.   HEENT: Patient is pale.  Dry buccal mucosa. Neck: Supple. Lungs: Clear to auscultation.   CVS: S1-S2. Neuro: Awake and alert. Extremities no leg edema.  Data Reviewed:   I have personally reviewed following labs and imaging studies:  Labs: Labs show the following:   Basic Metabolic Panel: Recent Labs  Lab 07/21/24 1800 07/21/24 1837 07/22/24 0120 07/23/24 0312 07/24/24 0318  NA 132* 134*  133* 134* 138 140  K 4.0 3.8  3.8 4.1 3.4* 3.8  CL 97* 98 102 103 104  CO2 22  --  21* 23 26  GLUCOSE 382* 390* 94 104* 121*  BUN 19 20 18 13 17   CREATININE 1.68* 1.50* 1.26* 1.19 1.28*  CALCIUM  9.4  --  8.7* 9.1 8.8*  MG  --   --  1.5* 1.6* 1.8  PHOS  --   --  2.1* 2.5 3.0   GFR Estimated Creatinine Clearance: 36.5 mL/min (A) (by C-G formula based on SCr of 1.28 mg/dL (H)). Liver Function Tests: Recent Labs  Lab 07/21/24 1800 07/22/24 0120 07/23/24 0312 07/24/24 0318  AST 19 24  --   --   ALT 8 7  --   --   ALKPHOS 92 69  --   --   BILITOT 1.1 1.4*  --   --   PROT 6.5 5.4*  --   --   ALBUMIN  2.2* 1.8* 2.0* 1.8*   Recent Labs  Lab 07/21/24 1800  LIPASE 21   No results for input(s): AMMONIA in the last 168 hours. Coagulation profile Recent Labs  Lab 07/21/24 1800  INR 1.3*    CBC: Recent Labs  Lab 07/21/24 1800 07/21/24 1837 07/22/24 0120 07/22/24 0426 07/23/24 0312 07/24/24 0318  WBC 19.8*  --  18.5* 16.9* 13.7* 10.4  NEUTROABS 18.0*  --   --   --   --   --   HGB 10.9* 11.6*  12.2* 9.1* 9.2* 9.7* 9.1*  HCT 33.9* 34.0*  36.0* 29.0* 29.1* 30.3* 28.7*  MCV 77.0*  --  78.8* 78.0* 77.9* 77.8*  PLT 464*  --  355 362 374 391   Cardiac Enzymes: Recent Labs  Lab 07/22/24 0120  CKTOTAL 259   BNP (last 3  results) No  results for input(s): PROBNP in the last 8760 hours. CBG: Recent Labs  Lab 07/26/24 0018 07/26/24 0419 07/26/24 0802 07/26/24 1150 07/26/24 1541  GLUCAP 252* 200* 241* 346* 359*   D-Dimer: No results for input(s): DDIMER in the last 72 hours. Hgb A1c: No results for input(s): HGBA1C in the last 72 hours.  Lipid Profile: No results for input(s): CHOL, HDL, LDLCALC, TRIG, CHOLHDL, LDLDIRECT in the last 72 hours. Thyroid  function studies: No results for input(s): TSH, T4TOTAL, T3FREE, THYROIDAB in the last 72 hours.  Invalid input(s): FREET3  Anemia work up: No results for input(s): VITAMINB12, FOLATE, FERRITIN, TIBC, IRON, RETICCTPCT in the last 72 hours.  Sepsis Labs: Recent Labs  Lab 07/21/24 2000 07/22/24 0120 07/22/24 0122 07/22/24 0426 07/22/24 1600 07/23/24 0312 07/24/24 0318  PROCALCITON  --  7.76  --   --   --   --   --   WBC  --  18.5*  --  16.9*  --  13.7* 10.4  LATICACIDVEN 4.0*  --  2.5* 1.6 1.8  --   --     Microbiology Recent Results (from the past 240 hours)  Culture, blood (Routine x 2)     Status: Abnormal   Collection Time: 07/21/24  6:00 PM   Specimen: BLOOD  Result Value Ref Range Status   Specimen Description BLOOD RIGHT ANTECUBITAL  Final   Special Requests   Final    BOTTLES DRAWN AEROBIC AND ANAEROBIC Blood Culture adequate volume   Culture  Setup Time   Final    GRAM NEGATIVE RODS AEROBIC BOTTLE ONLY CRITICAL RESULT CALLED TO, READ BACK BY AND VERIFIED WITH: [facility] MADELINE MITCHELL [phone] AT 1316 BY EC Performed at [facility], [address], [place], [place] [address]    Culture KLEBSIELLA PNEUMONIAE (A)  Final   Report Status 07/26/2024 FINAL  Final   Organism ID, Bacteria KLEBSIELLA PNEUMONIAE  Final   Organism ID, Bacteria KLEBSIELLA PNEUMONIAE  Final      Susceptibility   Klebsiella pneumoniae - KIRBY BAUER*    CEFAZOLIN INTERMEDIATE Intermediate    Klebsiella pneumoniae -  MIC*    AMPICILLIN  >=32 RESISTANT Resistant     CEFEPIME  <=0.12 SENSITIVE Sensitive     CEFTAZIDIME <=1 SENSITIVE Sensitive     CEFTRIAXONE  <=0.25 SENSITIVE Sensitive     CIPROFLOXACIN <=0.25 SENSITIVE Sensitive     GENTAMICIN <=1 SENSITIVE Sensitive     IMIPENEM <=0.25 SENSITIVE Sensitive     TRIMETH/SULFA <=20 SENSITIVE Sensitive     AMPICILLIN /SULBACTAM 4 SENSITIVE Sensitive     PIP/TAZO <=4 SENSITIVE Sensitive ug/mL    * KLEBSIELLA PNEUMONIAE    KLEBSIELLA PNEUMONIAE  Blood Culture ID Panel (Reflexed)     Status: Abnormal   Collection Time: 07/21/24  6:00 PM  Result Value Ref Range Status   Enterococcus faecalis NOT DETECTED NOT DETECTED Final   Enterococcus Faecium NOT DETECTED NOT DETECTED Final   Listeria monocytogenes NOT DETECTED NOT DETECTED Final   Staphylococcus species NOT DETECTED NOT DETECTED Final   Staphylococcus aureus (BCID) NOT DETECTED NOT DETECTED Final   Staphylococcus epidermidis NOT DETECTED NOT DETECTED Final   Staphylococcus lugdunensis NOT DETECTED NOT DETECTED Final   Streptococcus species NOT DETECTED NOT DETECTED Final   Streptococcus agalactiae NOT DETECTED NOT DETECTED Final   Streptococcus pneumoniae NOT DETECTED NOT DETECTED Final   Streptococcus pyogenes NOT DETECTED NOT DETECTED Final   A.calcoaceticus-baumannii NOT DETECTED NOT DETECTED Final   Bacteroides fragilis NOT DETECTED NOT  DETECTED Final   Enterobacterales DETECTED (A) NOT DETECTED Final    Comment: Enterobacterales represent a large order of gram negative bacteria, not a single organism. CRITICAL RESULT CALLED TO, READ BACK BY AND VERIFIED WITH: [facility] MADELINE MITCHELL [phone] AT 1316 BY EC    Enterobacter cloacae complex NOT DETECTED NOT DETECTED Final   Escherichia coli NOT DETECTED NOT DETECTED Final   Klebsiella aerogenes NOT DETECTED NOT DETECTED Final   Klebsiella oxytoca NOT DETECTED NOT DETECTED Final   Klebsiella pneumoniae DETECTED (A) NOT DETECTED Final    Comment:  CRITICAL RESULT CALLED TO, READ BACK BY AND VERIFIED WITH: PHARMD MADELINE MITCHELL [phone] AT 1316 BY EC    Proteus species NOT DETECTED NOT DETECTED Final   Salmonella species NOT DETECTED NOT DETECTED Final   Serratia marcescens NOT DETECTED NOT DETECTED Final   Haemophilus influenzae NOT DETECTED NOT DETECTED Final   Neisseria meningitidis NOT DETECTED NOT DETECTED Final   Pseudomonas aeruginosa NOT DETECTED NOT DETECTED Final   Stenotrophomonas maltophilia NOT DETECTED NOT DETECTED Final   Candida albicans NOT DETECTED NOT DETECTED Final   Candida auris NOT DETECTED NOT DETECTED Final   Candida glabrata NOT DETECTED NOT DETECTED Final   Candida krusei NOT DETECTED NOT DETECTED Final   Candida parapsilosis NOT DETECTED NOT DETECTED Final   Candida tropicalis NOT DETECTED NOT DETECTED Final   Cryptococcus neoformans/gattii NOT DETECTED NOT DETECTED Final   CTX-M ESBL NOT DETECTED NOT DETECTED Final   Carbapenem resistance IMP NOT DETECTED NOT DETECTED Final   Carbapenem resistance KPC NOT DETECTED NOT DETECTED Final   Carbapenem resistance NDM NOT DETECTED NOT DETECTED Final   Carbapenem resist OXA 48 LIKE NOT DETECTED NOT DETECTED Final   Carbapenem resistance VIM NOT DETECTED NOT DETECTED Final    Comment: Performed at [facility], [address], [place], [place] [address]  Culture, blood (Routine x 2)     Status: Abnormal   Collection Time: 07/21/24  6:10 PM   Specimen: BLOOD LEFT ARM  Result Value Ref Range Status   Specimen Description BLOOD LEFT ARM  Final   Special Requests   Final    BOTTLES DRAWN AEROBIC AND ANAEROBIC Blood Culture adequate volume   Culture  Setup Time   Final    GRAM POSITIVE COCCI IN CLUSTERS ANAEROBIC BOTTLE ONLY CRITICAL RESULT CALLED TO, READ BACK BY AND VERIFIED WITH: PHARMD LISA CURRAN ON 07/22/24 @ 2011 BY DRT    Culture (A)  Final    STAPHYLOCOCCUS CAPITIS THE SIGNIFICANCE OF ISOLATING THIS ORGANISM FROM A SINGLE SET OF BLOOD  CULTURES WHEN MULTIPLE SETS ARE DRAWN IS UNCERTAIN. PLEASE NOTIFY THE MICROBIOLOGY DEPARTMENT WITHIN ONE WEEK IF SPECIATION AND SENSITIVITIES ARE REQUIRED. Performed at [facility], [address], [place], [place] [address]    Report Status 07/24/2024 FINAL  Final  Blood Culture ID Panel (Reflexed)     Status: Abnormal   Collection Time: 07/21/24  6:10 PM  Result Value Ref Range Status   Enterococcus faecalis NOT DETECTED NOT DETECTED Final   Enterococcus Faecium NOT DETECTED NOT DETECTED Final   Listeria monocytogenes NOT DETECTED NOT DETECTED Final   Staphylococcus species DETECTED (A) NOT DETECTED Final    Comment: CRITICAL RESULT CALLED TO, READ BACK BY AND VERIFIED WITH: PHARMD LISA CURRAN ON 07/22/24 @ 2011 BY DRT    Staphylococcus aureus (BCID) NOT DETECTED NOT DETECTED Final   Staphylococcus epidermidis NOT DETECTED NOT DETECTED Final   Staphylococcus lugdunensis NOT DETECTED NOT  DETECTED Final   Streptococcus species NOT DETECTED NOT DETECTED Final   Streptococcus agalactiae NOT DETECTED NOT DETECTED Final   Streptococcus pneumoniae NOT DETECTED NOT DETECTED Final   Streptococcus pyogenes NOT DETECTED NOT DETECTED Final   A.calcoaceticus-baumannii NOT DETECTED NOT DETECTED Final   Bacteroides fragilis NOT DETECTED NOT DETECTED Final   Enterobacterales NOT DETECTED NOT DETECTED Final   Enterobacter cloacae complex NOT DETECTED NOT DETECTED Final   Escherichia coli NOT DETECTED NOT DETECTED Final   Klebsiella aerogenes NOT DETECTED NOT DETECTED Final   Klebsiella oxytoca NOT DETECTED NOT DETECTED Final   Klebsiella pneumoniae NOT DETECTED NOT DETECTED Final   Proteus species NOT DETECTED NOT DETECTED Final   Salmonella species NOT DETECTED NOT DETECTED Final   Serratia marcescens NOT DETECTED NOT DETECTED Final   Haemophilus influenzae NOT DETECTED NOT DETECTED Final   Neisseria meningitidis NOT DETECTED NOT DETECTED Final   Pseudomonas aeruginosa NOT DETECTED NOT  DETECTED Final   Stenotrophomonas maltophilia NOT DETECTED NOT DETECTED Final   Candida albicans NOT DETECTED NOT DETECTED Final   Candida auris NOT DETECTED NOT DETECTED Final   Candida glabrata NOT DETECTED NOT DETECTED Final   Candida krusei NOT DETECTED NOT DETECTED Final   Candida parapsilosis NOT DETECTED NOT DETECTED Final   Candida tropicalis NOT DETECTED NOT DETECTED Final   Cryptococcus neoformans/gattii NOT DETECTED NOT DETECTED Final    Comment: Performed at [facility], [address] [place], [place] [address]  Resp panel by RT-PCR (RSV, Flu A&B, Covid) Anterior Nasal Swab     Status: None   Collection Time: 07/21/24  6:17 PM   Specimen: Anterior Nasal Swab  Result Value Ref Range Status   SARS Coronavirus 2 by RT PCR NEGATIVE NEGATIVE Final   Influenza A by PCR NEGATIVE NEGATIVE Final   Influenza B by PCR NEGATIVE NEGATIVE Final    Comment: (NOTE) The Xpert Xpress SARS-CoV-2/FLU/RSV plus assay is intended as an aid in the diagnosis of influenza from Nasopharyngeal swab specimens and should not be used as a sole basis for treatment. Nasal washings and aspirates are unacceptable for Xpert Xpress SARS-CoV-2/FLU/RSV testing.  Fact Sheet for Patients: BloggerCourse.com  Fact Sheet for Healthcare Providers: SeriousBroker.it  This test is not yet approved or cleared by the [place]  FDA and has been authorized for detection and/or diagnosis of SARS-CoV-2 by FDA under an Emergency Use Authorization (EUA). This EUA will remain in effect (meaning this test can be used) for the duration of the COVID-19 declaration under Section 564(b)(1) of the Act, 21 U.S.C. section 360bbb-3(b)(1), unless the authorization is terminated or revoked.     Resp Syncytial Virus by PCR NEGATIVE NEGATIVE Final    Comment: (NOTE) Fact Sheet for Patients: BloggerCourse.com  Fact Sheet for Healthcare  Providers: SeriousBroker.it  This test is not yet approved or cleared by the [place]  FDA and has been authorized for detection and/or diagnosis of SARS-CoV-2 by FDA under an Emergency Use Authorization (EUA). This EUA will remain in effect (meaning this test can be used) for the duration of the COVID-19 declaration under Section 564(b)(1) of the Act, 21 U.S.C. section 360bbb-3(b)(1), unless the authorization is terminated or revoked.  Performed at [facility], [address], [place], [place] [address]   Urine Culture (for pregnant, neutropenic or urologic patients or patients with an indwelling urinary catheter)     Status: Abnormal   Collection Time: 07/22/24  4:26 AM   Specimen: Urine, Clean Catch  Result Value  Ref Range Status   Specimen Description URINE, CLEAN CATCH  Final   Special Requests NONE  Final   Culture (A)  Final    <10,000 COLONIES/mL INSIGNIFICANT GROWTH Performed at [facility], [address] [place], [place] [address]    Report Status 07/23/2024 FINAL  Final  MRSA Next Gen by PCR, Nasal     Status: None   Collection Time: 07/22/24  4:26 AM   Specimen: Urine, Clean Catch; Nasal Swab  Result Value Ref Range Status   MRSA by PCR Next Gen NOT DETECTED NOT DETECTED Final    Comment: (NOTE) The GeneXpert MRSA Assay (FDA approved for NASAL specimens only), is one component of a comprehensive MRSA colonization surveillance program. It is not intended to diagnose MRSA infection nor to guide or monitor treatment for MRSA infections. Test performance is not FDA approved in patients less than 64 years old. Performed at [facility], [address] [place], [place] [address]     Procedures and diagnostic studies:  No results found.   Time spent: 55 minutes.    LOS: 5 days   Leatrice LILLETTE Chapel  [facility]   Pager on [email or web address]. If 7PM-7AM, please contact night-coverage at  [email or web address]     07/26/2024, 6:56 PM

## 2024-07-27 DIAGNOSIS — Z66 Do not resuscitate: Secondary | ICD-10-CM

## 2024-07-27 DIAGNOSIS — R531 Weakness: Secondary | ICD-10-CM

## 2024-07-27 DIAGNOSIS — A419 Sepsis, unspecified organism: Secondary | ICD-10-CM | POA: Diagnosis not present

## 2024-07-27 DIAGNOSIS — R6520 Severe sepsis without septic shock: Secondary | ICD-10-CM | POA: Diagnosis not present

## 2024-07-27 LAB — GLUCOSE, CAPILLARY
Glucose-Capillary: 262 mg/dL — ABNORMAL HIGH (ref 70–99)
Glucose-Capillary: 220 mg/dL — ABNORMAL HIGH (ref 70–99)
Glucose-Capillary: 249 mg/dL — ABNORMAL HIGH (ref 70–99)
Glucose-Capillary: 328 mg/dL — ABNORMAL HIGH (ref 70–99)
Glucose-Capillary: 311 mg/dL — ABNORMAL HIGH (ref 70–99)
Glucose-Capillary: 256 mg/dL — ABNORMAL HIGH (ref 70–99)

## 2024-07-27 NOTE — TOC Progression Note (Signed)
 Transition of Care [facility]) - Progression Note    Patient Details  Name: Andre Stout MRN: [id number] Date of Birth: [birth year]  [facility] CM/SW Contact  Almarie CHRISTELLA [place], [place] Phone Number: 07/27/2024, 11:47 AM  Clinical Narrative:   CSW spoke with daughter to discuss disposition. Daughter said she was still wanting to discuss some things with MD before making a final decision, but now leaning towards rehab. Daughter asking about [facility] or [facility]. CSW contacted [facility], they do not have a bed available. CSW asked [facility] to review, awaiting response. CSW to follow.    Expected Discharge Plan: Skilled Nursing Facility Barriers to Discharge: Continued Medical Work up, SNF Pending bed offer, English as a second language teacher               Expected Discharge Plan and Services     Post Acute Care Choice: Skilled Nursing Facility Living arrangements for the past 2 months: Single Family Home                             HH Agency: [facility]         Social Drivers of Health (SDOH) Interventions SDOH Screenings   Food Insecurity: Patient Unable To Answer (07/23/2024)  Housing: Unknown (07/23/2024)  Transportation Needs: Patient Unable To Answer (07/23/2024)  Utilities: Patient Unable To Answer (07/23/2024)  Alcohol Screen: Low Risk  (07/24/2023)  Depression (PHQ2-9): Low Risk  (07/24/2023)  Financial Resource Strain: Low Risk  (07/24/2023)  Physical Activity: Inactive (07/24/2023)  Social Connections: Patient Unable To Answer (07/23/2024)  Stress: No Stress Concern Present (07/24/2023)  Tobacco Use: Medium Risk (05/07/2024)  Health Literacy: Adequate Health Literacy (07/24/2023)    Readmission Risk Interventions    08/15/2022    9:25 AM  Readmission Risk Prevention Plan  Transportation Screening Complete  PCP or Specialist Appt within 5-7 Days Complete  Home Care Screening Complete  Medication Review (RN CM) Complete

## 2024-07-27 NOTE — Progress Notes (Signed)
 Daily Progress Note   Patient Name: Andre Stout       Date: 07/27/2024 DOB: Jul 16, 1940  Age: 84 y.o. MRN#: [id number] Attending Physician: Rosario Leatrice FERNS, MD Primary Care Physician: Jason Leita Repine, FNP Admit Date: 07/21/2024  Reason for Consultation/Follow-up: Establishing goals of care  Subjective: I have reviewed medical records including EPIC notes, MAR, any available advanced directives as necessary, and labs. Received report from primary RN - no acute concerns. RN reports patient remains lethargic, only really waking up to eat.   Went to visit patient at bedside - no family/visitors present. Patient was lying in bed asleep - he does not wake to voice/gentle touch. Calorie count noted on door. No signs or non-verbal gestures of pain or discomfort noted. No respiratory distress, increased work of breathing, or secretions noted.   1:05 PM Called patient's daughter/Andre Stout - emotional support provided. Continued to discuss options of rehab vs home with hospice as well as discussions with my colleague over the weekend. Family are hopeful for patient's discharge to rehab. We reviewed his high risk for decline and rehospitalization. Outpatient Palliative Care explained and offered - she is agreeable.   All questions and concerns addressed. Encouraged to call with questions and/or concerns. PMT card provided.  Discussed case with Dr. Rosario and TOC.  Length of Stay: 6  Current Medications: Scheduled Meds:   atorvastatin   40 mg Oral Daily   feeding supplement  237 mL Oral BID BM   insulin  aspart  0-9 Units Subcutaneous Q4H   insulin  glargine-yfgn  7 Units Subcutaneous QHS   levETIRAcetam   500 mg Oral BID   Or   levETIRAcetam   500 mg Intravenous BID   multivitamin with  minerals  1 tablet Oral Daily   pantoprazole   40 mg Oral Q supper    Continuous Infusions:  cefTRIAXone  (ROCEPHIN )  IV 2 g (07/26/24 1330)    PRN Meds: acetaminophen  **OR** acetaminophen , albuterol , dextrose , fentaNYL  (SUBLIMAZE ) injection, HYDROcodone -acetaminophen   Physical Exam Vitals and nursing note reviewed.  Constitutional:      General: He is not in acute distress. Pulmonary:     Effort: No respiratory distress.  Skin:    General: Skin is warm and dry.  Neurological:     Mental Status: He is lethargic.     Motor: Weakness present.  Psychiatric:  Cognition and Memory: Cognition is impaired. Memory is impaired.             Vital Signs: BP 122/71 (BP Location: Left Arm)   Pulse 68   Temp 97.8 F (36.6 C) (Oral)   Resp 16   Ht 5' (1.524 m)   Wt 72.6 kg Comment: from May 2025 records  SpO2 97%   BMI 31.25 kg/m  SpO2: SpO2: 97 % O2 Device: O2 Device: Room Air O2 Flow Rate: O2 Flow Rate (L/min): 3 L/min  Intake/output summary:  Intake/Output Summary (Last 24 hours) at 07/27/2024 1142 Last data filed at 07/27/2024 0408 Gross per 24 hour  Intake 120 ml  Output 600 ml  Net -480 ml   LBM: Last BM Date : 07/26/24 Baseline Weight: Weight: 72.6 kg (from May 2025 records) Most recent weight: Weight: 72.6 kg (from May 2025 records)       Palliative Assessment/Data: PPS 30%      Patient Active Problem List   Diagnosis Date Noted   Moderate malnutrition (HCC) 07/25/2024   L1 vertebral fracture (HCC) 07/22/2024   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   Severe sepsis (HCC) 07/21/2024   Aspiration pneumonia (HCC) 07/21/2024   Acute respiratory failure with hypoxia (HCC) 07/21/2024   CKD (chronic kidney disease) stage 3, GFR 30-59 ml/min (HCC) 07/21/2024   Subdural hematoma (HCC) 07/21/2024   Irritant contact dermatitis associated with stoma 03/13/2024   Persistent postoperative fistula 03/13/2024   GERD without esophagitis 03/05/2024   Uncontrolled type 2 diabetes  mellitus with hyperglycemia, with long-term current use of insulin  (HCC) 03/05/2024   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   History of complete heart block 03/05/2024   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   Malnutrition of moderate degree 01/20/2024   Hyperlipidemia 01/17/2024   Leukocytosis 01/17/2024   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 (HCC) 01/17/2024   Acute encephalopathy 01/17/2024   fracture of distal diaphyseal metaphyseal junction of the 5th left metacarpal with dorsal angulation 01/17/2024   Cholecystostomy care (HCC) 01/17/2024   Migration of percutaneous cholecystostomy tube 08/18/2023   Hypertension associated with diabetes (HCC) 08/18/2023   History of stroke 08/18/2023   Mixed 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    Of note, patient has had 2 admissions in the last 6 months  Assessment: Principal Problem:   Severe sepsis (HCC) Active Problems:   Benign essential HTN   Prostate cancer (HCC)    Prolonged QT interval   Heart block AV complete (HCC)   History of stroke   Hyperlipidemia   Acute encephalopathy   Uncontrolled type 2 diabetes mellitus with hyperglycemia, with long-term current use of insulin  (HCC)  Mobitz type 1 second degree atrioventricular block   Aspiration pneumonia (HCC)   Acute respiratory failure with hypoxia (HCC)   CKD (chronic kidney disease) stage 3, GFR 30-59 ml/min (HCC)   Subdural hematoma (HCC)   L1 vertebral fracture (HCC)   Biliary fistula   Moderate malnutrition (HCC)   Recommendations/Plan: Continue current plan of care Continue DNR/DNI - durable DNR form completed and placed in shadow chart. Copy was made and will be scanned into Vynca/ACP tab Daughter is hopeful for patient's discharge to SNF rehab with outpatient Palliative Care to follow - TOC notified and consult placed PMT will continue to follow peripherally. If there are any imminent needs please call the service directly  Goals of Care and Additional Recommendations: Limitations on Scope of Treatment: Full Scope Treatment  Code Status:    Code Status Orders  (From admission, onward)           Start     Ordered   07/25/24 1517  Do not attempt resuscitation (DNR)- Limited -Do Not Intubate (DNI)  (Code Status)  Continuous       Question Answer Comment  If pulseless and not breathing No CPR or chest compressions.   In Pre-Arrest Conditions (Patient Is Breathing and Has A Pulse) Do not intubate. Provide all appropriate non-invasive medical interventions. Avoid ICU transfer unless indicated or required.   Consent: Discussion documented in EHR or advanced directives reviewed      07/25/24 1516           Code Status History     Date Active Date Inactive Code Status Order ID Comments User Context   07/21/2024 2346 07/25/2024 1516 Full Code [id number]  Silvester Ales, MD ED   07/21/2024 2327 07/21/2024 2346 Full Code [id number]  Arloa Chroman, PA-C ED   03/03/2024 1327  03/05/2024 2334 Full Code [id number]  Zella Katha HERO, MD ED   01/17/2024 1823 01/23/2024 0105 Full Code [id number]  Waddell Rake, MD ED   08/18/2023 1914 08/20/2023 2211 Full Code [id number]  Tobie Jorie SAUNDERS, MD Inpatient   10/15/2022 0335 10/17/2022 0001 Full Code [id number]  Shona Terry SAILOR, DO ED   08/14/2022 1533 08/28/2022 1830 Full Code [id number]  Laurita Cort DASEN, MD ED   08/01/2022 0025 08/05/2022 0021 Full Code [id number]  Laveda Roosevelt, MD ED   01/02/2022 2237 01/06/2022 0138 Full Code [id number]  Seena Marsa NOVAK, MD ED   10/09/2020 1827 10/10/2020 2259 Full Code [id number]  Sim Emery CROME, MD ED   03/04/2016 1607 03/07/2016 2149 Full Code [id number]  Mavis Ritchie BROCKS, MD Inpatient       Prognosis:  Unable to determine  Discharge Planning: Skilled Nursing Facility for rehab with Palliative care service follow-up  Care plan was discussed with primary RN, patient's daughter, [facility], Dr. Rosario  Thank you for allowing the Palliative Medicine Team to assist in the care of this patient.   Total Time 35 minutes Prolonged Time Billed  no       Jeoffrey HERO Sharps, NP  Please contact Palliative Medicine Team phone at [phone] for questions and concerns.   *Portions of this note are a verbal dictation therefore any spelling and/or grammatical errors are due to the Dragon [employer] One system interpretation.

## 2024-07-27 NOTE — Inpatient Diabetes Management (Signed)
 Inpatient Diabetes Program Recommendations  AACE/ADA: New Consensus Statement on Inpatient Glycemic Control (2015)  Target Ranges:  Prepandial:   less than 140 mg/dL      Peak postprandial:   less than 180 mg/dL (1-2 hours)      Critically ill patients:  140 - 180 mg/dL   Lab Results  Component Value Date   GLUCAP 220 (H) 07/27/2024   HGBA1C 10.7 (H) 07/21/2024    Review of Glycemic Control  Latest Reference Range & Units 07/26/24 08:02 07/26/24 11:50 07/26/24 15:41 07/26/24 19:54 07/26/24 23:42 07/27/24 04:12 07/27/24 07:42  Glucose-Capillary 70 - 99 mg/dL 758 (H) 653 (H) 640 (H) 336 (H) 241 (H) 262 (H) 220 (H)  (H): Data is abnormally high  Diabetes history: DM2 Outpatient Diabetes medications: 70/30 30 units QAM, 70/30 15-20 units QPM, FreeStyle Libre 3 CGM Current orders for Inpatient glycemic control: Semglee  7 units at bedtime, Novolog  0-9 units Q4H  Inpatient Diabetes Program Recommendations:    Consider slightly increasing Semglee  10 units every day and changing correction to TID & HS.   Thanks, Tinnie Minus, MSN, RNC-OB Diabetes Coordinator [phone] (8a-5p)

## 2024-07-27 NOTE — Progress Notes (Signed)
 Calorie Count Note  48-hour calorie count ordered. Pt well exceeded his nutrition needs over the weekend and is set to meet them today as well with his first two meals. No concerns with pt achieving his nutrition needs at this time. Will follow up as planned.  Diet: DYS 1, thin Supplements: Ensure Plus High Protein BID  Estimated Nutritional Needs:  Kcal:  1700-1900g Protein:  75-90g Fluid:  1.7-1.9L  7/26 Breakfast: 530 kcal, 29g protein 7/26 Lunch: 542 kcal, 28g protein 7/26 Dinner: 337 kcal 25g protein Supplements: 350 kcal, 20g protein  Total intake 7/26: 1759 kcal (100% of minimum estimated needs)  102 protein (100% of minimum estimated needs)  7/27 Breakfast: 378 kcal, 9g protein 7/27 Lunch: 587 kcal, 37g protein 7/27 Dinner: 494 kcal, 20g protein Supplements: 578 kcal, 33g protein  Total intake: 2037 kcal (>100% of minimum estimated needs)  99 protein (>100% of minimum estimated needs)  7/28 Breakfast: 190 kcal, 7g protein 7/28 Lunch: 563 kcal, 28g protein  Total intake x 2 meals: 753 kcal (44% of minimum estimated needs)  35 protein (47% of minimum estimated needs)  NUTRITION DIAGNOSIS:  Moderate Malnutrition related to chronic illness as evidenced by mild fat depletion, moderate muscle depletion. - Ongoing, being addressed via oral nutrition supplements   GOAL:  Patient will meet greater than or equal to 90% of their needs - Progressing  INTERVENTION:  - Continue Ensure Plus High Protein po BID, each supplement provides 350 kcal and 20 grams of protein - With dysphagia 1 diet order and automatic meal trays, pt currently receiving Magic Cup BID with lunch and dinner meals. Will add additional Magic Cup with breakfast meal, each supplement provides 290 kcal and 9 grams of protein. - Continue MVI with minerals daily    Vernell Lukes, RD, LDN, CNSC Registered Dietitian II Please reach out via secure chat

## 2024-07-27 NOTE — Progress Notes (Signed)
 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  Patient was admitted to the hospital with altered mental status, general weakness and hyperglycemia. Family reported that patient had fallen about a week prior to admission.  Family noticed patient had developed nausea and vomiting few days prior to admission, and was becoming weaker and confused.  Family brought patient to the ED due to concerns for possible another DKA. [name]  On presentation to the ED, patient was febrile with temperature up to 102.3 F, tachypneic with respiratory rate up to 25, heart rate 88, BP 107/68 and O2 sat 97% on 2 L of oxygen.  Initial glucose on admission was 415.  Subsequently, patient developed hypoglycemia with glucose dropping to 61.  Lactic acid was 5.4 but subsequently improved to 1.6.  CT chest, abdomen and pelvis was suspicious for right lung pneumonia, possibly, aspiration pneumonia.  Moderate compression fracture deformity at L1.  CT head without contrast revealed acute left posterior convexity subdural hematoma, measuring up to 8 mm, without midline shift.  07/24/2024: Blood cultures growing Klebsiella pneumonia.   07/27/2024: Patient continues to improve slowly.  Plan is to discharge patient to skilled nursing facility.  No new complaints today.  Assessment/Plan:   Principal Problem:   Severe sepsis (HCC) Active Problems:   Benign essential HTN   Prostate cancer (HCC)   Prolonged QT interval   Heart block AV complete (HCC)   History of stroke   Hyperlipidemia   Acute encephalopathy   Uncontrolled type 2 diabetes mellitus with hyperglycemia, with long-term current use  of insulin  (HCC)   Mobitz type 1 second degree atrioventricular block   Aspiration pneumonia (HCC)   Acute respiratory failure with hypoxia (HCC)   CKD (chronic kidney disease) stage 3, GFR 30-59 ml/min (HCC)   Subdural hematoma (HCC)   L1 vertebral fracture (HCC)   Biliary fistula   Moderate malnutrition (HCC)    Body mass index is 31.25 kg/m.  (Class I obesity)    Severe sepsis secondary to community-acquired pneumonia, probable right aspiration pneumonia: - Blood culture has grown Klebsiella. - Patient is currently on IV Rocephin . - Urine culture has not grown any significant organisms. -MRSA screen was negative. -Complete course of antibiotics.   - Patient is slowly improving. -Eventual discharge to skilled nursing facility.  Hypotension:  -Resolved.    Acute kidney injury: - Slowly resolving. - Serum creatinine of 1.28 today. - Continue to avoid nephrotoxins. - Keep MAP greater than 65 mmHg. -Last renal panel done on 07/24/2024 revealed BUN of 17 and serum creatinine of 1.28. - Repeat renal panel in the morning.  Acute metabolic encephalopathy: - Continues to slowly improve.   -As per prior documentation, concern for possible dementia.    Acute left-sided subdural hematoma (8 mm) without midline shift: -Repeat CT head revealed stability of SDH, measuring 6 mm.  Patient was evaluated by the neurosurgery team.  -Conservative management for now.  Plavix  to be held for 7 to 10 days.   -Prophylactic Keppra  500 mg twice daily for up to 7 days maximum. -Outpatient follow-up with neurosurgery team.   -Recent fall prior to admission: - PT and OT recommended discharge to SNF  Type II DM, uncontrolled: - Blood sugar on presentation was 415, but dropped to the 61.   -Continue sensitive dose sliding scale insulin  coverage. -Adjust insulin  as needed.  Hypomagnesemia: - Last magnesium  was 1.8.    Hypophosphatemia: - Resolved.  Hypokalemia: - Last potassium was  3.8.  Prolonged QTc interval (517):  Repeat EKG after electrolytes are replete.    L1 compression fracture:  TLSO brace recommended.  Moderate malnutrition: - Albumin  of 1.8. - Consult dietary team. - Caloric count.   Comorbidities include: History of complete intermittent heart block followed by cardiologist, History of cholecystitis s/p cholecystostomy tube placement in 2023, s/p percutaneous biliary drain, hypertension, history of stroke (was on aspirin  and Plavix  prior to admission), hyperlipidemia, prostate cancer   Consultants: Neurosurgery  Procedures: None    Medications:    atorvastatin   40 mg Oral Daily   feeding supplement  237 mL Oral BID BM   insulin  aspart  0-9 Units Subcutaneous Q4H   insulin  glargine-yfgn  7 Units Subcutaneous QHS   levETIRAcetam   500 mg Oral BID   Or   levETIRAcetam   500 mg Intravenous BID   multivitamin with minerals  1 tablet Oral Daily   pantoprazole   40 mg Oral Q supper   Continuous Infusions:  cefTRIAXone  (ROCEPHIN )  IV 2 g (07/27/24 1406)     Anti-infectives (From admission, onward)    Start     Dose/Rate Route Frequency Ordered Stop   07/24/24 1430  cefTRIAXone  (ROCEPHIN ) 2 g in sodium chloride  0.9 % 100 mL IVPB        2 g 200 mL/hr over 30 Minutes Intravenous Every 24 hours 07/24/24 1334     07/22/24 1446  vancomycin  variable dose per unstable renal function (pharmacist dosing)  Status:  Discontinued         Does not apply See admin instructions 07/22/24 1446 07/22/24 1600   07/22/24 0400  Ampicillin -Sulbactam (UNASYN ) 3 g in sodium chloride  0.9 % 100 mL IVPB  Status:  Discontinued        3 g 200 mL/hr over 30 Minutes Intravenous Every 6 hours 07/21/24 2359 07/24/24 1334   07/22/24 0000  vancomycin  (VANCOREADY) IVPB 500 mg/100 mL        500 mg 100 mL/hr over 60 Minutes Intravenous  Once 07/21/24 2359 07/22/24 0122   07/21/24 1830  ceFEPIme  (MAXIPIME ) 2 g in sodium chloride  0.9 % 100 mL IVPB        2 g 200 mL/hr  over 30 Minutes Intravenous  Once 07/21/24 1818 07/21/24 1939   07/21/24 1830  metroNIDAZOLE  (FLAGYL ) IVPB 500 mg        500 mg 100 mL/hr over 60 Minutes Intravenous  Once 07/21/24 1818 07/21/24 2055   07/21/24 1830  vancomycin  (VANCOCIN ) IVPB 1000 mg/200 mL premix        1,000 mg 200 mL/hr over 60 Minutes Intravenous  Once 07/21/24 1818 07/21/24 2140              Family Communication/Anticipated D/C date and plan/Code Status   DVT prophylaxis: SCDs Start: 07/22/24 0029     Code Status: Limited: Do not attempt resuscitation (DNR) -DNR-LIMITED -Do Not Intubate/DNI   Family Communication: Plan discussed with Michaelle, daughter, over the phone. Disposition Plan: Plan to discharge to SNF   Status is: Inpatient Remains inpatient appropriate because: Severe sepsis   Subjective:   No significant history from patient.  Objective:    Vitals:   07/27/24 0405 07/27/24 0744 07/27/24 1112 07/27/24 1610  BP: (!) 141/66 (!) 160/79 122/71 122/75  Pulse: 64 70 68 71  Resp: 16  16   Temp: 98 F (36.7 C) 98.2 F (36.8 C) 97.8 F (36.6 C) 98.2 F (36.8 C)  TempSrc: Oral Oral Oral Oral  SpO2: 99% 97% 97% 96%  Weight:      Height:       No data found.   Intake/Output Summary (Last 24 hours) at 07/27/2024 1812 Last data filed at 07/27/2024 0408 Gross per 24 hour  Intake 120 ml  Output 600 ml  Net -480 ml   Filed Weights   07/21/24 2343  Weight: 72.6 kg    Exam:   GEN: Not in any distress.  Awake.  Mittens to both hands.   HEENT: Patient is pale.  Dry buccal mucosa. Neck: Supple. Lungs: Clear to auscultation.   CVS: S1-S2. Neuro: Awake and alert. Extremities no leg edema.  Data Reviewed:   I have personally reviewed following labs and imaging studies:  Labs: Labs show the following:   Basic Metabolic Panel: Recent Labs  Lab 07/21/24 1800 07/21/24 1837 07/22/24 0120 07/23/24 0312 07/24/24 0318  NA 132* 134*  133* 134* 138 140  K 4.0 3.8  3.8 4.1  3.4* 3.8  CL 97* 98 102 103 104  CO2 22  --  21* 23 26  GLUCOSE 382* 390* 94 104* 121*  BUN 19 20 18 13 17   CREATININE 1.68* 1.50* 1.26* 1.19 1.28*  CALCIUM  9.4  --  8.7* 9.1 8.8*  MG  --   --  1.5* 1.6* 1.8  PHOS  --   --  2.1* 2.5 3.0   GFR Estimated Creatinine Clearance: 36.5 mL/min (A) (by C-G formula based on SCr of 1.28 mg/dL (H)). Liver Function Tests: Recent Labs  Lab 07/21/24 1800 07/22/24 0120 07/23/24 0312 07/24/24 0318  AST 19 24  --   --   ALT 8 7  --   --   ALKPHOS 92 69  --   --   BILITOT 1.1 1.4*  --   --   PROT 6.5 5.4*  --   --   ALBUMIN  2.2* 1.8* 2.0* 1.8*   Recent Labs  Lab 07/21/24 1800  LIPASE 21   No results for input(s): AMMONIA in the last 168 hours. Coagulation profile Recent Labs  Lab 07/21/24 1800  INR 1.3*    CBC: Recent Labs  Lab 07/21/24 1800 07/21/24 1837 07/22/24 0120 07/22/24 0426 07/23/24 0312 07/24/24 0318  WBC 19.8*  --  18.5* 16.9* 13.7* 10.4  NEUTROABS 18.0*  --   --   --   --   --   HGB 10.9* 11.6*  12.2* 9.1* 9.2* 9.7* 9.1*  HCT 33.9* 34.0*  36.0* 29.0* 29.1* 30.3* 28.7*  MCV 77.0*  --  78.8* 78.0* 77.9* 77.8*  PLT 464*  --  355 362 374 391   Cardiac Enzymes: Recent Labs  Lab 07/22/24 0120  CKTOTAL 259   BNP (last 3 results) No results for input(s): PROBNP in the last 8760 hours. CBG: Recent Labs  Lab 07/26/24 2342 07/27/24 0412 07/27/24 0742 07/27/24 1112 07/27/24 1611  GLUCAP 241* 262* 220* 249* 328*   D-Dimer: No results for input(s): DDIMER in the last 72 hours. Hgb A1c: No results for input(s): HGBA1C in the last 72 hours.  Lipid Profile: No results for input(s): CHOL, HDL, LDLCALC, TRIG, CHOLHDL, LDLDIRECT in the last 72 hours. Thyroid  function studies: No results for input(s): TSH, T4TOTAL, T3FREE, THYROIDAB in  the last 72 hours.  Invalid input(s): FREET3  Anemia work up: No results for input(s): VITAMINB12, FOLATE, FERRITIN, TIBC, IRON,  RETICCTPCT in the last 72 hours.  Sepsis Labs: Recent Labs  Lab 07/21/24 2000 07/22/24 0120 07/22/24 0122 07/22/24 0426 07/22/24 1600 07/23/24 0312 07/24/24 0318  PROCALCITON  --  7.76  --   --   --   --   --   WBC  --  18.5*  --  16.9*  --  13.7* 10.4  LATICACIDVEN 4.0*  --  2.5* 1.6 1.8  --   --     Microbiology Recent Results (from the past 240 hours)  Culture, blood (Routine x 2)     Status: Abnormal   Collection Time: 07/21/24  6:00 PM   Specimen: BLOOD  Result Value Ref Range Status   Specimen Description BLOOD RIGHT ANTECUBITAL  Final   Special Requests   Final    BOTTLES DRAWN AEROBIC AND ANAEROBIC Blood Culture adequate volume   Culture  Setup Time   Final    GRAM NEGATIVE RODS AEROBIC BOTTLE ONLY CRITICAL RESULT CALLED TO, READ BACK BY AND VERIFIED WITH: [facility] MADELINE MITCHELL [phone] AT 1316 BY EC Performed at [facility], [address] [place], [place] [address]    Culture KLEBSIELLA PNEUMONIAE (A)  Final   Report Status 07/26/2024 FINAL  Final   Organism ID, Bacteria KLEBSIELLA PNEUMONIAE  Final   Organism ID, Bacteria KLEBSIELLA PNEUMONIAE  Final      Susceptibility   Klebsiella pneumoniae - KIRBY BAUER*    CEFAZOLIN INTERMEDIATE Intermediate    Klebsiella pneumoniae - MIC*    AMPICILLIN  >=32 RESISTANT Resistant     CEFEPIME  <=0.12 SENSITIVE Sensitive     CEFTAZIDIME <=1 SENSITIVE Sensitive     CEFTRIAXONE  <=0.25 SENSITIVE Sensitive     CIPROFLOXACIN <=0.25 SENSITIVE Sensitive     GENTAMICIN <=1 SENSITIVE Sensitive     IMIPENEM <=0.25 SENSITIVE Sensitive     TRIMETH/SULFA <=20 SENSITIVE Sensitive     AMPICILLIN /SULBACTAM 4 SENSITIVE Sensitive     PIP/TAZO <=4 SENSITIVE Sensitive ug/mL    * KLEBSIELLA PNEUMONIAE    KLEBSIELLA PNEUMONIAE  Blood Culture ID Panel (Reflexed)     Status: Abnormal   Collection Time: 07/21/24  6:00 PM  Result Value Ref Range Status   Enterococcus faecalis NOT DETECTED NOT DETECTED Final   Enterococcus  Faecium NOT DETECTED NOT DETECTED Final   Listeria monocytogenes NOT DETECTED NOT DETECTED Final   Staphylococcus species NOT DETECTED NOT DETECTED Final   Staphylococcus aureus (BCID) NOT DETECTED NOT DETECTED Final   Staphylococcus epidermidis NOT DETECTED NOT DETECTED Final   Staphylococcus lugdunensis NOT DETECTED NOT DETECTED Final   Streptococcus species NOT DETECTED NOT DETECTED Final   Streptococcus agalactiae NOT DETECTED NOT DETECTED Final   Streptococcus pneumoniae NOT DETECTED NOT DETECTED Final   Streptococcus pyogenes NOT DETECTED NOT DETECTED Final   A.calcoaceticus-baumannii NOT DETECTED NOT DETECTED Final   Bacteroides fragilis NOT DETECTED NOT DETECTED Final   Enterobacterales DETECTED (A) NOT DETECTED Final    Comment: Enterobacterales represent a large order of gram negative bacteria, not a single organism. CRITICAL RESULT CALLED TO, READ BACK BY AND VERIFIED WITH: [facility] MADELINE MITCHELL [phone] AT 1316 BY EC    Enterobacter cloacae complex NOT DETECTED NOT DETECTED Final   Escherichia coli NOT DETECTED NOT DETECTED Final   Klebsiella aerogenes NOT DETECTED NOT DETECTED Final   Klebsiella oxytoca NOT DETECTED NOT DETECTED Final   Klebsiella pneumoniae DETECTED (  A) NOT DETECTED Final    Comment: CRITICAL RESULT CALLED TO, READ BACK BY AND VERIFIED WITH: PHARMD MADELINE MITCHELL [phone] AT 1316 BY EC    Proteus species NOT DETECTED NOT DETECTED Final   Salmonella species NOT DETECTED NOT DETECTED Final   Serratia marcescens NOT DETECTED NOT DETECTED Final   Haemophilus influenzae NOT DETECTED NOT DETECTED Final   Neisseria meningitidis NOT DETECTED NOT DETECTED Final   Pseudomonas aeruginosa NOT DETECTED NOT DETECTED Final   Stenotrophomonas maltophilia NOT DETECTED NOT DETECTED Final   Candida albicans NOT DETECTED NOT DETECTED Final   Candida auris NOT DETECTED NOT DETECTED Final   Candida glabrata NOT DETECTED NOT DETECTED Final   Candida krusei NOT  DETECTED NOT DETECTED Final   Candida parapsilosis NOT DETECTED NOT DETECTED Final   Candida tropicalis NOT DETECTED NOT DETECTED Final   Cryptococcus neoformans/gattii NOT DETECTED NOT DETECTED Final   CTX-M ESBL NOT DETECTED NOT DETECTED Final   Carbapenem resistance IMP NOT DETECTED NOT DETECTED Final   Carbapenem resistance KPC NOT DETECTED NOT DETECTED Final   Carbapenem resistance NDM NOT DETECTED NOT DETECTED Final   Carbapenem resist OXA 48 LIKE NOT DETECTED NOT DETECTED Final   Carbapenem resistance VIM NOT DETECTED NOT DETECTED Final    Comment: Performed at [facility], [address], [place], [place] [address]  Culture, blood (Routine x 2)     Status: Abnormal   Collection Time: 07/21/24  6:10 PM   Specimen: BLOOD LEFT ARM  Result Value Ref Range Status   Specimen Description BLOOD LEFT ARM  Final   Special Requests   Final    BOTTLES DRAWN AEROBIC AND ANAEROBIC Blood Culture adequate volume   Culture  Setup Time   Final    GRAM POSITIVE COCCI IN CLUSTERS ANAEROBIC BOTTLE ONLY CRITICAL RESULT CALLED TO, READ BACK BY AND VERIFIED WITH: PHARMD LISA CURRAN ON 07/22/24 @ 2011 BY DRT    Culture (A)  Final    STAPHYLOCOCCUS CAPITIS THE SIGNIFICANCE OF ISOLATING THIS ORGANISM FROM A SINGLE SET OF BLOOD CULTURES WHEN MULTIPLE SETS ARE DRAWN IS UNCERTAIN. PLEASE NOTIFY THE MICROBIOLOGY DEPARTMENT WITHIN ONE WEEK IF SPECIATION AND SENSITIVITIES ARE REQUIRED. Performed at [facility], [address] [place], [place] [address]    Report Status 07/24/2024 FINAL  Final  Blood Culture ID Panel (Reflexed)     Status: Abnormal   Collection Time: 07/21/24  6:10 PM  Result Value Ref Range Status   Enterococcus faecalis NOT DETECTED NOT DETECTED Final   Enterococcus Faecium NOT DETECTED NOT DETECTED Final   Listeria monocytogenes NOT DETECTED NOT DETECTED Final   Staphylococcus species DETECTED (A) NOT DETECTED Final    Comment: CRITICAL RESULT CALLED TO, READ BACK BY AND  VERIFIED WITH: PHARMD LISA CURRAN ON 07/22/24 @ 2011 BY DRT    Staphylococcus aureus (BCID) NOT DETECTED NOT DETECTED Final   Staphylococcus epidermidis NOT DETECTED NOT DETECTED Final   Staphylococcus lugdunensis NOT DETECTED NOT DETECTED Final   Streptococcus species NOT DETECTED NOT DETECTED Final   Streptococcus agalactiae NOT DETECTED NOT DETECTED Final   Streptococcus pneumoniae NOT DETECTED NOT DETECTED Final   Streptococcus pyogenes NOT DETECTED NOT DETECTED Final   A.calcoaceticus-baumannii NOT DETECTED NOT DETECTED Final   Bacteroides fragilis NOT DETECTED NOT DETECTED Final   Enterobacterales NOT DETECTED NOT DETECTED Final   Enterobacter cloacae complex NOT DETECTED NOT DETECTED Final   Escherichia coli NOT DETECTED NOT DETECTED Final   Klebsiella aerogenes NOT DETECTED  NOT DETECTED Final   Klebsiella oxytoca NOT DETECTED NOT DETECTED Final   Klebsiella pneumoniae NOT DETECTED NOT DETECTED Final   Proteus species NOT DETECTED NOT DETECTED Final   Salmonella species NOT DETECTED NOT DETECTED Final   Serratia marcescens NOT DETECTED NOT DETECTED Final   Haemophilus influenzae NOT DETECTED NOT DETECTED Final   Neisseria meningitidis NOT DETECTED NOT DETECTED Final   Pseudomonas aeruginosa NOT DETECTED NOT DETECTED Final   Stenotrophomonas maltophilia NOT DETECTED NOT DETECTED Final   Candida albicans NOT DETECTED NOT DETECTED Final   Candida auris NOT DETECTED NOT DETECTED Final   Candida glabrata NOT DETECTED NOT DETECTED Final   Candida krusei NOT DETECTED NOT DETECTED Final   Candida parapsilosis NOT DETECTED NOT DETECTED Final   Candida tropicalis NOT DETECTED NOT DETECTED Final   Cryptococcus neoformans/gattii NOT DETECTED NOT DETECTED Final    Comment: Performed at [facility], [address], [place], [place] [address]  Resp panel by RT-PCR (RSV, Flu A&B, Covid) Anterior Nasal Swab     Status: None   Collection Time: 07/21/24  6:17 PM   Specimen: Anterior  Nasal Swab  Result Value Ref Range Status   SARS Coronavirus 2 by RT PCR NEGATIVE NEGATIVE Final   Influenza A by PCR NEGATIVE NEGATIVE Final   Influenza B by PCR NEGATIVE NEGATIVE Final    Comment: (NOTE) The Xpert Xpress SARS-CoV-2/FLU/RSV plus assay is intended as an aid in the diagnosis of influenza from Nasopharyngeal swab specimens and should not be used as a sole basis for treatment. Nasal washings and aspirates are unacceptable for Xpert Xpress SARS-CoV-2/FLU/RSV testing.  Fact Sheet for Patients: BloggerCourse.com  Fact Sheet for Healthcare Providers: SeriousBroker.it  This test is not yet approved or cleared by the [place]  FDA and has been authorized for detection and/or diagnosis of SARS-CoV-2 by FDA under an Emergency Use Authorization (EUA). This EUA will remain in effect (meaning this test can be used) for the duration of the COVID-19 declaration under Section 564(b)(1) of the Act, 21 U.S.C. section 360bbb-3(b)(1), unless the authorization is terminated or revoked.     Resp Syncytial Virus by PCR NEGATIVE NEGATIVE Final    Comment: (NOTE) Fact Sheet for Patients: BloggerCourse.com  Fact Sheet for Healthcare Providers: SeriousBroker.it  This test is not yet approved or cleared by the [place]  FDA and has been authorized for detection and/or diagnosis of SARS-CoV-2 by FDA under an Emergency Use Authorization (EUA). This EUA will remain in effect (meaning this test can be used) for the duration of the COVID-19 declaration under Section 564(b)(1) of the Act, 21 U.S.C. section 360bbb-3(b)(1), unless the authorization is terminated or revoked.  Performed at [facility], [address] [place], [place] [address]   Urine Culture (for pregnant, neutropenic or urologic patients or patients with an indwelling urinary catheter)     Status: Abnormal    Collection Time: 07/22/24  4:26 AM   Specimen: Urine, Clean Catch  Result Value Ref Range Status   Specimen Description URINE, CLEAN CATCH  Final   Special Requests NONE  Final   Culture (A)  Final    <10,000 COLONIES/mL INSIGNIFICANT GROWTH Performed at [facility], [address], [place], [place] [address]    Report Status 07/23/2024 FINAL  Final  MRSA Next Gen by PCR, Nasal     Status: None   Collection Time: 07/22/24  4:26 AM   Specimen: Urine, Clean Catch; Nasal Swab  Result Value Ref Range  Status   MRSA by PCR Next Gen NOT DETECTED NOT DETECTED Final    Comment: (NOTE) The GeneXpert MRSA Assay (FDA approved for NASAL specimens only), is one component of a comprehensive MRSA colonization surveillance program. It is not intended to diagnose MRSA infection nor to guide or monitor treatment for MRSA infections. Test performance is not FDA approved in patients less than 25 years old. Performed at [facility], [address], [place], [place] [address]     Procedures and diagnostic studies:  No results found.   Time spent: 35 minutes.    LOS: 6 days   Leatrice LILLETTE Chapel  [facility]   Pager on [email or web address]. If 7PM-7AM, please contact night-coverage at [email or web address]     07/27/2024, 6:12 PM

## 2024-07-27 NOTE — Plan of Care (Signed)
  Problem: Coping: Goal: Ability to adjust to condition or change in health will improve Outcome: Progressing   Problem: Skin Integrity: Goal: Risk for impaired skin integrity will decrease Outcome: Progressing   Problem: Tissue Perfusion: Goal: Adequacy of tissue perfusion will improve Outcome: Progressing   Problem: Nutrition: Goal: Adequate nutrition will be maintained Outcome: Progressing

## 2024-07-27 NOTE — Progress Notes (Signed)
 Occupational Therapy Treatment Patient Details Name: Andre Stout MRN: [id number] DOB: [birth year] Today's Date: 07/27/2024   History of present illness 84 y.o. male presents to [facility] 07/21/24 for generalized fatigue and weakness with a fall one week prior. CT head showed acute L posterior convexity SDH. Pt also with acute encephalopathy and sepsis. CT chest showed moderate compression fx at L1. PMH: AV block, hypertension, type 2 diabetes, prostate cancer, glaucoma, HTNm, Cholecystitis s/p cholecystostomy in 07/2022.   OT comments  Patient lethargic and unable to maintain LOA with attempted EOB sitting and log roll.  Near total assist for all mobility, unable to safely try and apply back brace given LOA in sitting, patient placed back in bed, scooted higher/repositioned.  OT can continue efforts in the acute setting to address deficits and Patient will benefit from continued inpatient follow up therapy, <3 hours/day.      If plan is discharge home, recommend the following:  Assistance with cooking/housework;Assist for transportation;Help with stairs or ramp for entrance;Two people to help with bathing/dressing/bathroom;Two people to help with walking and/or transfers   Equipment Recommendations       Recommendations for Other Services      Precautions / Restrictions Precautions Precautions: Fall;Back Recall of Precautions/Restrictions: Impaired Required Braces or Orthoses: Spinal Brace Spinal Brace: Thoracolumbosacral orthotic;Applied in sitting position       Mobility Bed Mobility   Bed Mobility: Rolling, Sidelying to Sit, Sit to Sidelying Rolling: Max assist Sidelying to sit: Total assist     Sit to sidelying: Total assist      Transfers                         Balance   Sitting-balance support: Feet supported Sitting balance-Leahy Scale: Poor Sitting balance - Comments: unable to maintain LOA to try and place brace, patient positioned back in bed Postural  control: Posterior lean, Right lateral lean, Left lateral lean                                 ADL either performed or assessed with clinical judgement   ADL           Upper Body Bathing: Maximal assistance;Bed level           Lower Body Dressing: Total assistance;Bed level                      Extremity/Trunk Assessment              Vision       Perception     Praxis     Communication Communication Communication: Impaired Factors Affecting Communication: Hearing impaired   Cognition Arousal: Obtunded Behavior During Therapy: Flat affect Cognition: Difficult to assess Difficult to assess due to: Hard of hearing/deaf, Level of arousal                             Following commands: Impaired Following commands impaired: Follows one step commands inconsistently       Cueing Techniques: Verbal cues, Tactile cues, Gestural cues, Visual cues                     Pertinent Vitals/ Pain       Pain Assessment Pain Assessment: Faces Faces Pain Scale: Hurts little more Pain Location: With rolling and bed mobility Pain Descriptors /  Indicators: Grimacing Pain Intervention(s): Monitored during session                                                          Frequency  Min 2X/week        Progress Toward Goals  OT Goals(current goals can now be found in the care plan section)  Progress towards OT goals: Not progressing toward goals - comment (poor LOA)  Acute Rehab OT Goals OT Goal Formulation: Patient unable to participate in goal setting Time For Goal Achievement: 08/05/24 Potential to Achieve Goals: Fair  Plan      Co-evaluation                 AM-PAC OT 6 Clicks Daily Activity     Outcome Measure   Help from another person eating meals?: Total Help from another person taking care of personal grooming?: A Lot Help from another person toileting, which includes using  toliet, bedpan, or urinal?: A Lot Help from another person bathing (including washing, rinsing, drying)?: A Lot Help from another person to put on and taking off regular upper body clothing?: A Lot Help from another person to put on and taking off regular lower body clothing?: Total 6 Click Score: 10    End of Session    OT Visit Diagnosis: Unsteadiness on feet (R26.81);Muscle weakness (generalized) (M62.81);Other symptoms and signs involving cognitive function   Activity Tolerance Patient limited by lethargy   Patient Left in bed;with call bell/phone within reach;with bed alarm set   Nurse Communication Mobility status        Time: 1040-1100 OT Time Calculation (min): 20 min  Charges: OT General Charges $OT Visit: 1 Visit OT Treatments $Therapeutic Activity: 8-22 mins  07/27/2024  RP, OTR/L  Acute Rehabilitation Services  Office:  [phone]   Charlie JONETTA Halsted 07/27/2024, 11:10 AM

## 2024-07-28 DIAGNOSIS — R6520 Severe sepsis without septic shock: Secondary | ICD-10-CM | POA: Diagnosis not present

## 2024-07-28 DIAGNOSIS — A419 Sepsis, unspecified organism: Secondary | ICD-10-CM | POA: Diagnosis not present

## 2024-07-28 LAB — GLUCOSE, CAPILLARY
Glucose-Capillary: 106 mg/dL — ABNORMAL HIGH (ref 70–99)
Glucose-Capillary: 120 mg/dL — ABNORMAL HIGH (ref 70–99)
Glucose-Capillary: 269 mg/dL — ABNORMAL HIGH (ref 70–99)
Glucose-Capillary: 297 mg/dL — ABNORMAL HIGH (ref 70–99)
Glucose-Capillary: 303 mg/dL — ABNORMAL HIGH (ref 70–99)
Glucose-Capillary: 238 mg/dL — ABNORMAL HIGH (ref 70–99)

## 2024-07-28 LAB — CBC
HCT: 31.9 % — ABNORMAL LOW (ref 39.0–52.0)
Hemoglobin: 9.9 g/dL — ABNORMAL LOW (ref 13.0–17.0)
MCH: 24.3 pg — ABNORMAL LOW (ref 26.0–34.0)
MCHC: 31 g/dL (ref 30.0–36.0)
MCV: 78.4 fL — ABNORMAL LOW (ref 80.0–100.0)
Platelets: 445 K/uL — ABNORMAL HIGH (ref 150–400)
RBC: 4.07 MIL/uL — ABNORMAL LOW (ref 4.22–5.81)
RDW: 18.8 % — ABNORMAL HIGH (ref 11.5–15.5)
WBC: 9 K/uL (ref 4.0–10.5)
nRBC: 0 % (ref 0.0–0.2)

## 2024-07-28 LAB — RENAL FUNCTION PANEL
Albumin: 1.7 g/dL — ABNORMAL LOW (ref 3.5–5.0)
Anion gap: 7 (ref 5–15)
BUN: 13 mg/dL (ref 8–23)
CO2: 25 mmol/L (ref 22–32)
Calcium: 8.4 mg/dL — ABNORMAL LOW (ref 8.9–10.3)
Chloride: 103 mmol/L (ref 98–111)
Creatinine, Ser: 0.93 mg/dL (ref 0.61–1.24)
GFR, Estimated: >60 mL/min (ref >60)
Glucose, Bld: 112 mg/dL — ABNORMAL HIGH (ref 70–99)
Phosphorus: 2.4 mg/dL — ABNORMAL LOW (ref 2.5–4.6)
Potassium: 3.9 mmol/L (ref 3.5–5.1)
Sodium: 135 mmol/L (ref 135–145)

## 2024-07-28 LAB — MAGNESIUM: Magnesium: 1.3 mg/dL — ABNORMAL LOW (ref 1.7–2.4)

## 2024-07-28 MED ORDER — MAGNESIUM SULFATE 2 GM/50ML IV SOLN
2.0000 g | Freq: Once | INTRAVENOUS | Status: AC
  Administered 2024-07-28: 2 g via INTRAVENOUS
  Filled 2024-07-28: qty 50

## 2024-07-28 MED ORDER — CEFADROXIL 500 MG PO CAPS
500.0000 mg | ORAL_CAPSULE | Freq: Two times a day (BID) | ORAL | Status: DC
  Administered 2024-07-29 – 2024-07-30 (×3): 500 mg via ORAL
  Filled 2024-07-28 (×3): qty 1

## 2024-07-28 NOTE — Progress Notes (Signed)
 Physical Therapy Treatment Patient Details Name: Andre Stout MRN: [id number] DOB: [birth year] Today's Date: 07/28/2024   History of Present Illness 84 y.o. male presents to [facility] 07/21/24 for generalized fatigue and weakness with a fall one week prior. CT head showed acute L posterior convexity SDH. Pt also with acute encephalopathy and sepsis. CT chest showed moderate compression fx at L1. PMH: AV block, hypertension, type 2 diabetes, prostate cancer, glaucoma, HTNm, Cholecystitis s/p cholecystostomy in 07/2022.    PT Comments  Pt received in supine and agreeable to session. Pt demonstrates improved initiation with increased cues due to [facility] and impaired problem solving. Pt able to tolerate multiple standing trials from EOB, but is unable to take lateral steps and demonstrates poor standing tolerance requiring multiple seated rest breaks. Pt demonstrates posterior bias requiring increased assist and cues initially to correct. Pt incontinent of bowel upon standing requiring total A for pericare in standing. Pt continues to benefit from PT services to progress toward functional mobility goals.     If plan is discharge home, recommend the following: A lot of help with walking and/or transfers;A lot of help with bathing/dressing/bathroom;Assistance with cooking/housework;Direct supervision/assist for medications management;Direct supervision/assist for financial management;Assist for transportation;Help with stairs or ramp for entrance   Can travel by private vehicle     No  Equipment Recommendations  Wheelchair (measurements PT);Wheelchair cushion (measurements PT);BSC/3in1;Hospital bed;Hoyer lift;Rolling walker (2 wheels);Other (comment)    Recommendations for Other Services       Precautions / Restrictions Precautions Precautions: Fall;Back Precaution/Restrictions Comments: SBP <160 Required Braces or Orthoses: Spinal Brace Spinal Brace: Thoracolumbosacral orthotic;Applied in sitting  position Restrictions Weight Bearing Restrictions Per Provider Order: No     Mobility  Bed Mobility Overal bed mobility: Needs Assistance Bed Mobility: Rolling, Sidelying to Sit, Sit to Sidelying Rolling: Mod assist Sidelying to sit: Mod assist, Used rails, HOB elevated     Sit to sidelying: Max assist, +2 for safety/equipment General bed mobility comments: Difficulty following commands and mod A for all aspects    Transfers Overall transfer level: Needs assistance Equipment used: Rolling walker (2 wheels) Transfers: Sit to/from Stand Sit to Stand: Mod assist, Min assist, +2 safety/equipment           General transfer comment: From EOB with mod A progressing to min A with dense cues for anterior weight shift due to posterior bias. Increased time and cues for upright posture. Pt unable to lateral scoot along EOB.    Ambulation/Gait               General Gait Details: unable   Stairs             Wheelchair Mobility     Tilt Bed    Modified Rankin (Stroke Patients Only)       Balance Overall balance assessment: History of Falls, Needs assistance Sitting-balance support: Feet supported, Bilateral upper extremity supported Sitting balance-Leahy Scale: Poor     Standing balance support: Bilateral upper extremity supported, Reliant on assistive device for balance Standing balance-Leahy Scale: Poor Standing balance comment: reliant on ext support                            Communication Communication Communication: Impaired Factors Affecting Communication: Hearing impaired  Cognition Arousal: Alert Behavior During Therapy: Flat affect   PT - Cognitive impairments: No family/caregiver present to determine baseline, Orientation, Awareness, Memory, Attention, Sequencing, Problem solving, Safety/Judgement Difficult to assess due to: Hard  of hearing/deaf                       Following commands: Impaired Following commands  impaired: Follows one step commands inconsistently    Cueing Cueing Techniques: Verbal cues, Tactile cues, Gestural cues, Visual cues  Exercises General Exercises - Lower Extremity Long Arc Quad: AROM, Seated, Both, 5 reps    General Comments        Pertinent Vitals/Pain Pain Assessment Pain Assessment: No/denies pain     PT Goals (current goals can now be found in the care plan section) Acute Rehab PT Goals Patient Stated Goal: unable to state goal PT Goal Formulation: Patient unable to participate in goal setting Time For Goal Achievement: 08/05/24 Progress towards PT goals: Progressing toward goals    Frequency    Min 2X/week       AM-PAC PT 6 Clicks Mobility   Outcome Measure  Help needed turning from your back to your side while in a flat bed without using bedrails?: A Lot Help needed moving from lying on your back to sitting on the side of a flat bed without using bedrails?: A Lot Help needed moving to and from a bed to a chair (including a wheelchair)?: A Lot Help needed standing up from a chair using your arms (e.g., wheelchair or bedside chair)?: A Lot Help needed to walk in hospital room?: Total Help needed climbing 3-5 steps with a railing? : Total 6 Click Score: 10    End of Session Equipment Utilized During Treatment: Gait belt;Back brace Activity Tolerance: Patient tolerated treatment well;Patient limited by fatigue Patient left: in bed;with call bell/phone within reach;with bed alarm set Nurse Communication: Mobility status PT Visit Diagnosis: Other abnormalities of gait and mobility (R26.89);Muscle weakness (generalized) (M62.81);History of falling (Z91.81)     Time: 8992-8965 PT Time Calculation (min) (ACUTE ONLY): 27 min  Charges:    $Therapeutic Activity: 23-37 mins PT General Charges $$ ACUTE PT VISIT: 1 Visit                    Darryle George, PTA Acute Rehabilitation Services Secure Chat Preferred  [phone]     Darryle George 07/28/2024, 12:34 PM

## 2024-07-28 NOTE — Hospital Course (Addendum)
 84 y.o. M with hx CHB s/p PPM, cholecystitis s/p chronic perc chole tube now fistulized, prosCA, HTN, DM, and stroke who presented with confusion, malaise, found to have DKA, sepsis.  CT chest in the ER showed aspiration pneumonia.  CT head showed SDH.

## 2024-07-28 NOTE — Plan of Care (Signed)
  Problem: Skin Integrity: Goal: Risk for impaired skin integrity will decrease Outcome: Progressing   Problem: Clinical Measurements: Goal: Diagnostic test results will improve Outcome: Progressing Goal: Signs and symptoms of infection will decrease Outcome: Progressing   Problem: Clinical Measurements: Goal: Ability to maintain clinical measurements within normal limits will improve Outcome: Progressing Goal: Will remain free from infection Outcome: Progressing Goal: Diagnostic test results will improve Outcome: Progressing Goal: Respiratory complications will improve Outcome: Progressing Goal: Cardiovascular complication will be avoided Outcome: Progressing   Problem: Nutrition: Goal: Adequate nutrition will be maintained Outcome: Progressing   Problem: Pain Managment: Goal: General experience of comfort will improve and/or be controlled Outcome: Progressing   Problem: Safety: Goal: Ability to remain free from injury will improve Outcome: Progressing   Problem: Elimination: Goal: Will not experience complications related to bowel motility Outcome: Progressing Goal: Will not experience complications related to urinary retention Outcome: Progressing

## 2024-07-28 NOTE — Progress Notes (Signed)
 Progress Note   Patient: Andre Stout FMW:[id number] DOB: February 04, 1940 DOA: 07/21/2024     7 DOS: the patient was seen and examined on 07/28/2024 at 11:14AM      Brief hospital course: 84 y.o. M with hx CHB s/p PPM, cholecystitis s/p chronic perc chole tube now fistulized, prosCA, CKD IIIa, HTN, DM, and stroke who presented with confusion, malaise, found to have DKA, sepsis.  CT chest in the ER showed aspiration pneumonia.     Assessment and Plan: Severe sepsis secondary to community-acquired pneumonia, probable right aspiration pneumonia Presented with tachypnea, fever, elevated lactic acid greater than 4, encephalopathy, and AKI.  Chest CT showed aspiration pneumonia.  Urine culture showed insignificant growth, blood culture growing coag negative staph in 1 culture, likely contaminant, Klebsiella and the other. Has now completed 7 days antibiotics (3 days Unasyn , 4 days Rocephin ) -Stop antibiotics          Acute left-sided subdural hematoma without midline shift CT on admission showed 8 mm subdural hematoma.  Repeat CT showed stability.  Neurosurgery evaluated the patient and recommended conservative management - Hold aspirin  and Plavix  - Prophylactic Keppra  for 7 days now stopped. - Outpatient follow-up with neurosurgery pending goals of care   Spoke frankly with family today about prognosis given his baseline debility and now worse mentation/poor oral intake with SDH.  I suspect most likely this is an illness he will not recover from, and Hospice would be reasonable.  Family would like to trial rehab, and it is true that he may recover. - Consult Palliative at SNF    Acute kidney injury CKD ruled out, creatinine 1.7 on admission, improved to 0.9, baseline.  Due to sepsis.      Acute metabolic encephalopathy The patient has baseline dementia, no formal diagnosis.  At presentation, he was poorly responsive, this improved to baseline confusion, memory loss with treatment.          Diabetic ketoacidosis Diabetes, type II  Hyperglycemia Hypoglycemia Admitted with elevated beta hydroxybutyrate and glucose greater than 400.  This was corrected with subcutaneous insulin .  Hemoglobin A1c 10.7%.  Since then his glucoses have been quite labile. Glucose is elevated again - Resume Semglee  today - Continue sliding scale correction insulin       Hypertension BP controlled off medicaitons - Hold amlodipine    L1 compression fracture - Maintain TLSO brace when out of bed   Cerebrovascular disease Hyperlipidemia - Hold aspirin  and Plavix  given subdural hematoma - Continue Lipitor  Complete heart block Status post pacemaker  Prostate cancer  Hypomagnesemia - Supplement magnesium  again  Hypophosphatemia Resolved  Hypokalemia Resolved  Prolonged QT interval Resolved with electrolyte repletion               Subjective: Mentation poor.  No fever overnight.  No vomiting, but poor oral intake.  Denies pain complainst.     Physical Exam: BP 138/66 (BP Location: Left Arm)   Pulse 66   Temp 99.8 F (37.7 C) (Oral)   Resp 18   Ht 5' (1.524 m)   Wt 72.6 kg Comment: from May 2025 records  SpO2 96%   BMI 31.25 kg/m   Elderly adult male, lying in bed, appears weak and tired, opens eyes briefly then closes them again RRR, no murmurs, no peripheral edema, no pitting in the extremities Respiratory rate slow, lung sounds diminished but no rales or wheezes appreciated Abdomen soft, no grimace to palpation, denies tenderness Attention diminished, eyes closed, does not make eye contact, opens  eyes briefly, answers questions with one-word, oriented to self only, attention is diminished, severe generalized weakness    Data Reviewed: Basic metabolic panel shows normal electrolytes and renal function Magnesium  low CBC shows stable anemia   Family Communication: Daughter by phone    Disposition: Status is: Inpatient 84 year old man, with PPM  and chronic fistula, presented with sepsis, DKA, subdural hematoma.  He has completed course of antibiotics, without much improvement.  He is not an operative candidate for his SDH, and his mentation remains poor.  Overall the prognosis is uncertain, he is medically stabilized to begin rehabilitation, with palliative following        Author: Lonni SHAUNNA Dalton, MD 07/28/2024 2:30 PM  For on call review [email or web address].

## 2024-07-28 NOTE — Plan of Care (Signed)
  Problem: Education: Goal: Ability to describe self-care measures that may prevent or decrease complications (Diabetes Survival Skills Education) will improve Outcome: Progressing Goal: Individualized Educational Video(s) Outcome: Progressing   Problem: Coping: Goal: Ability to adjust to condition or change in health will improve Outcome: Progressing   Problem: Fluid Volume: Goal: Ability to maintain a balanced intake and output will improve Outcome: Progressing   Problem: Health Behavior/Discharge Planning: Goal: Ability to identify and utilize available resources and services will improve Outcome: Progressing Goal: Ability to manage health-related needs will improve Outcome: Progressing   Problem: Metabolic: Goal: Ability to maintain appropriate glucose levels will improve Outcome: Progressing   Problem: Nutritional: Goal: Maintenance of adequate nutrition will improve Outcome: Progressing Goal: Progress toward achieving an optimal weight will improve Outcome: Progressing   Problem: Skin Integrity: Goal: Risk for impaired skin integrity will decrease Outcome: Progressing   Problem: Tissue Perfusion: Goal: Adequacy of tissue perfusion will improve Outcome: Progressing   Problem: Fluid Volume: Goal: Hemodynamic stability will improve Outcome: Progressing   Problem: Clinical Measurements: Goal: Diagnostic test results will improve Outcome: Progressing Goal: Signs and symptoms of infection will decrease Outcome: Progressing   Problem: Respiratory: Goal: Ability to maintain adequate ventilation will improve Outcome: Progressing   Problem: Education: Goal: Knowledge of General Education information will improve Description: Including pain rating scale, medication(s)/side effects and non-pharmacologic comfort measures Outcome: Progressing   Problem: Health Behavior/Discharge Planning: Goal: Ability to manage health-related needs will improve Outcome:  Progressing   Problem: Clinical Measurements: Goal: Ability to maintain clinical measurements within normal limits will improve Outcome: Progressing Goal: Will remain free from infection Outcome: Progressing Goal: Diagnostic test results will improve Outcome: Progressing Goal: Respiratory complications will improve Outcome: Progressing Goal: Cardiovascular complication will be avoided Outcome: Progressing   Problem: Activity: Goal: Risk for activity intolerance will decrease Outcome: Progressing   Problem: Nutrition: Goal: Adequate nutrition will be maintained Outcome: Progressing   Problem: Coping: Goal: Level of anxiety will decrease Outcome: Progressing   Problem: Elimination: Goal: Will not experience complications related to bowel motility Outcome: Progressing Goal: Will not experience complications related to urinary retention Outcome: Progressing   Problem: Pain Managment: Goal: General experience of comfort will improve and/or be controlled Outcome: Progressing   Problem: Safety: Goal: Ability to remain free from injury will improve Outcome: Progressing   Problem: Skin Integrity: Goal: Risk for impaired skin integrity will decrease Outcome: Progressing   Problem: Activity: Goal: Ability to tolerate increased activity will improve Outcome: Progressing   Problem: Clinical Measurements: Goal: Ability to maintain a body temperature in the normal range will improve Outcome: Progressing   Problem: Respiratory: Goal: Ability to maintain adequate ventilation will improve Outcome: Progressing Goal: Ability to maintain a clear airway will improve Outcome: Progressing

## 2024-07-28 NOTE — TOC Progression Note (Signed)
 Transition of Care [facility]) - Progression Note    Patient Details  Name: Andre Stout MRN: [id number] Date of Birth: Dec 27, 1940  [facility] CM/SW Contact  Almarie CHRISTELLA Goodie, [place] Phone Number: 07/28/2024, 2:47 PM  Clinical Narrative:   CSW spoke with daughter, Michaelle Amble, to discuss SNF options. Daughter said she wanted the full list of options sent to her email. CSW sent email to patient's daughter for her to review. CSW to follow.    Expected Discharge Plan: Skilled Nursing Facility Barriers to Discharge: Continued Medical Work up, SNF Pending bed offer, English as a second language teacher               Expected Discharge Plan and Services     Post Acute Care Choice: Skilled Nursing Facility Living arrangements for the past 2 months: Single Family Home                             HH Agency: [facility]         Social Drivers of Health (SDOH) Interventions SDOH Screenings   Food Insecurity: Patient Unable To Answer (07/23/2024)  Housing: Unknown (07/23/2024)  Transportation Needs: Patient Unable To Answer (07/23/2024)  Utilities: Patient Unable To Answer (07/23/2024)  Alcohol Screen: Low Risk  (07/24/2023)  Depression (PHQ2-9): Low Risk  (07/24/2023)  Financial Resource Strain: Low Risk  (07/24/2023)  Physical Activity: Inactive (07/24/2023)  Social Connections: Patient Unable To Answer (07/23/2024)  Stress: No Stress Concern Present (07/24/2023)  Tobacco Use: Medium Risk (05/07/2024)  Health Literacy: Adequate Health Literacy (07/24/2023)    Readmission Risk Interventions    08/15/2022    9:25 AM  Readmission Risk Prevention Plan  Transportation Screening Complete  PCP or Specialist Appt within 5-7 Days Complete  Home Care Screening Complete  Medication Review (RN CM) Complete

## 2024-07-29 DIAGNOSIS — R6520 Severe sepsis without septic shock: Secondary | ICD-10-CM | POA: Diagnosis not present

## 2024-07-29 DIAGNOSIS — A419 Sepsis, unspecified organism: Secondary | ICD-10-CM | POA: Diagnosis not present

## 2024-07-29 LAB — CBC
HCT: 32.1 % — ABNORMAL LOW (ref 39.0–52.0)
Hemoglobin: 9.8 g/dL — ABNORMAL LOW (ref 13.0–17.0)
MCH: 23.9 pg — ABNORMAL LOW (ref 26.0–34.0)
MCHC: 30.5 g/dL (ref 30.0–36.0)
MCV: 78.3 fL — ABNORMAL LOW (ref 80.0–100.0)
Platelets: 502 K/uL — ABNORMAL HIGH (ref 150–400)
RBC: 4.1 MIL/uL — ABNORMAL LOW (ref 4.22–5.81)
RDW: 19.2 % — ABNORMAL HIGH (ref 11.5–15.5)
WBC: 9.4 K/uL (ref 4.0–10.5)
nRBC: 0 % (ref 0.0–0.2)

## 2024-07-29 LAB — GLUCOSE, CAPILLARY
Glucose-Capillary: 163 mg/dL — ABNORMAL HIGH (ref 70–99)
Glucose-Capillary: 175 mg/dL — ABNORMAL HIGH (ref 70–99)
Glucose-Capillary: 266 mg/dL — ABNORMAL HIGH (ref 70–99)
Glucose-Capillary: 323 mg/dL — ABNORMAL HIGH (ref 70–99)
Glucose-Capillary: 380 mg/dL — ABNORMAL HIGH (ref 70–99)
Glucose-Capillary: 222 mg/dL — ABNORMAL HIGH (ref 70–99)

## 2024-07-29 LAB — COMPREHENSIVE METABOLIC PANEL WITH GFR
ALT: 16 U/L (ref 0–44)
AST: 22 U/L (ref 15–41)
Albumin: 1.7 g/dL — ABNORMAL LOW (ref 3.5–5.0)
Alkaline Phosphatase: 71 U/L (ref 38–126)
Anion gap: 5 (ref 5–15)
BUN: 15 mg/dL (ref 8–23)
CO2: 26 mmol/L (ref 22–32)
Calcium: 8.6 mg/dL — ABNORMAL LOW (ref 8.9–10.3)
Chloride: 104 mmol/L (ref 98–111)
Creatinine, Ser: 0.97 mg/dL (ref 0.61–1.24)
GFR, Estimated: >60 mL/min (ref >60)
Glucose, Bld: 160 mg/dL — ABNORMAL HIGH (ref 70–99)
Potassium: 4 mmol/L (ref 3.5–5.1)
Sodium: 135 mmol/L (ref 135–145)
Total Bilirubin: 0.4 mg/dL (ref 0.0–1.2)
Total Protein: 5.2 g/dL — ABNORMAL LOW (ref 6.5–8.1)

## 2024-07-29 MED ORDER — OMEPRAZOLE 2 MG/ML ORAL SUSPENSION
40.0000 mg | Freq: Every day | ORAL | Status: DC
  Administered 2024-07-29: 40 mg via ORAL
  Filled 2024-07-29 (×2): qty 20

## 2024-07-29 NOTE — Inpatient Diabetes Management (Addendum)
 Inpatient Diabetes Program Recommendations  AACE/ADA: New Consensus Statement on Inpatient Glycemic Control (2015)  Target Ranges:  Prepandial:   less than 140 mg/dL      Peak postprandial:   less than 180 mg/dL (1-2 hours)      Critically ill patients:  140 - 180 mg/dL   Lab Results  Component Value Date   GLUCAP 175 (H) 07/29/2024   HGBA1C 10.7 (H) 07/21/2024    Review of Glycemic Control  Latest Reference Range & Units 07/28/24 07:56 07/28/24 11:44 07/28/24 15:28 07/28/24 20:24 07/28/24 23:15 07/29/24 04:26 07/29/24 07:38  Glucose-Capillary 70 - 99 mg/dL 879 (H) 730 (H) 702 (H) 303 (H) 238 (H) 163 (H) 175 (H)   Diabetes history: DM2 Outpatient Diabetes medications: 70/30 30 units QAM, 70/30 15-20 units QPM, FreeStyle Libre 3 CGM Current orders for Inpatient glycemic control:  Semglee  7 units at bedtime Novolog  0-9 units Q4H  Ensure + High protein (19 grams of carbohydrates) bid between meals  Inpatient Diabetes Program Recommendations:    -   consider adding Novolog  3 units tid meal coverage if consuming at least 50% of meals/supplements  Thanks, Clotilda Bull RN, MSN, BC-ADM Inpatient Diabetes Coordinator Team Pager [phone] (8a-5p)

## 2024-07-29 NOTE — Progress Notes (Signed)
 Physical Therapy Treatment Patient Details Name: Andre Stout MRN: [id number] DOB: 1940/11/15 Today's Date: 07/29/2024   History of Present Illness 84 y.o. male presents to [facility] 07/21/24 for generalized fatigue and weakness with a fall one week prior. CT head showed acute L posterior convexity SDH. Pt also with acute encephalopathy, sepsis, and DKA. CT chest showed moderate compression fx at L1. PMH: AV block, hypertension, type 2 diabetes, prostate cancer, glaucoma, HTNm, Cholecystitis s/p cholecystostomy in 07/2022.   PT Comments  Pt in bed upon arrival and agreeable to PT session. Multimodal cues used throughout session with pt inconsistently following commands, likely due to being very HOH. Upon initial stand with ModAx2, pt had bowel incontinence. Performed step-pivot to Andre Stout after pericare with MaxAx2 and use of RW. Assist needed to keep RW close and to shift hips as pt attempted to sit prior to being close to [name]. Multiple stands for pericare with increased assistance needed as pt fatigued. Heavy posterior lean upon last stand with MaxAx2 to transfer back to the bed. Continue to recommend <3hrs post acute rehab. Acute PT to follow.    If plan is discharge home, recommend the following: A lot of help with walking and/or transfers;A lot of help with bathing/dressing/bathroom;Assistance with cooking/housework;Direct supervision/assist for medications management;Direct supervision/assist for financial management;Assist for transportation;Help with stairs or ramp for entrance   Can travel by private vehicle     No  Equipment Recommendations  Wheelchair (measurements PT);Wheelchair cushion (measurements PT);BSC/3in1;Stout bed;Hoyer lift;Rolling walker (2 wheels);Other (comment)       Precautions / Restrictions Precautions Precautions: Fall;Back Recall of Precautions/Restrictions: Impaired Precaution/Restrictions Comments: SBP <160, fecal incontinence Required Braces or Orthoses: Spinal  Brace Spinal Brace: Thoracolumbosacral orthotic;Applied in sitting position Restrictions Weight Bearing Restrictions Per Provider Order: No     Mobility  Bed Mobility Overal bed mobility: Needs Assistance Bed Mobility: Rolling, Sidelying to Sit, Sit to Sidelying Rolling: Mod assist Sidelying to sit: Mod assist, Used rails, HOB elevated     Sit to sidelying: Max assist General bed mobility comments: assist to bring hand to rail with ModA to complete roll and push up into sitting. MaxA for return to sidelying for LE management and to control trunk decsent. TotalAx2 to scoot towards HOB in supine    Transfers Overall transfer level: Needs assistance Equipment used: Rolling walker (2 wheels) Transfers: Sit to/from Stand, Bed to chair/wheelchair/BSC Sit to Stand: Mod assist, Max assist, +2 safety/equipment, +2 physical assistance   Step pivot transfers: Max assist, +2 physical assistance, +2 safety/equipment    General transfer comment: Initial stand with ModAx2 to boost-up and steady with repetitive cues for hand placement. Able to perform step-pivot to Andre Stout with MaxAx2 and RW. Assist to shift hips as pt attempted to sit prior to being close to [name]. Heavy posterior lean with subsequent stands and step-pivot transfer    Ambulation/Gait  General Gait Details: unable this date     Balance Overall balance assessment: History of Falls, Needs assistance Sitting-balance support: Feet supported, Bilateral upper extremity supported Sitting balance-Leahy Scale: Fair   Postural control: Posterior lean Standing balance support: Bilateral upper extremity supported, Reliant on assistive device for balance Standing balance-Leahy Scale: Poor Standing balance comment: reliant on UE and external support     Communication Communication Communication: Impaired Factors Affecting Communication: Hearing impaired  Cognition Arousal: Alert Behavior During Therapy: Flat affect   PT - Cognitive  impairments: No family/caregiver present to determine baseline, Awareness, Memory, Attention, Sequencing, Problem solving, Safety/Judgement, Initiation Difficult to assess  due to: Hard of hearing/deaf    PT - Cognition Comments: slightly decreased initiation with multimodal cues necessary likely due to pt being very HOH Following commands: Impaired Following commands impaired: Follows one step commands inconsistently, Follows one step commands with increased time    Cueing Cueing Techniques: Verbal cues, Tactile cues, Gestural cues, Visual cues  Exercises Other Exercises Other Exercises: x3 STS with ModAx2 to MaxAx2, fatiguing with increased repetition        Pertinent Vitals/Pain Pain Assessment Pain Assessment: No/denies pain     PT Goals (current goals can now be found in the care plan section) Acute Rehab PT Goals PT Goal Formulation: Patient unable to participate in goal setting Time For Goal Achievement: 08/05/24 Potential to Achieve Goals: Fair Progress towards PT goals: Progressing toward goals    Frequency    Min 2X/week       AM-PAC PT 6 Clicks Mobility   Outcome Measure  Help needed turning from your back to your side while in a flat bed without using bedrails?: A Lot Help needed moving from lying on your back to sitting on the side of a flat bed without using bedrails?: A Lot Help needed moving to and from a bed to a chair (including a wheelchair)?: Total Help needed standing up from a chair using your arms (e.g., wheelchair or bedside chair)?: Total Help needed to walk in Stout room?: Total Help needed climbing 3-5 steps with a railing? : Total 6 Click Score: 8    End of Session Equipment Utilized During Treatment: Gait belt;Back brace Activity Tolerance: Patient tolerated treatment well Patient left: in bed;with call bell/phone within reach;with bed alarm set;with nursing/sitter in room Nurse Communication: Mobility status;Other (comment) (in room  assisting at end of session) PT Visit Diagnosis: Other abnormalities of gait and mobility (R26.89);Muscle weakness (generalized) (M62.81);History of falling (Z91.81)     Time: 8664-8586 PT Time Calculation (min) (ACUTE ONLY): 38 min  Charges:    $Therapeutic Exercise: 8-22 mins $Therapeutic Activity: 23-37 mins PT General Charges $$ ACUTE PT VISIT: 1 Visit                    Kate ORN, PT, DPT Secure Chat Preferred  Rehab Office [phone]    Kate BRAVO Wendolyn 07/29/2024, 3:14 PM

## 2024-07-29 NOTE — Plan of Care (Signed)
  Problem: Nutritional: Goal: Maintenance of adequate nutrition will improve Outcome: Progressing Goal: Progress toward achieving an optimal weight will improve Outcome: Progressing   Problem: Skin Integrity: Goal: Risk for impaired skin integrity will decrease Outcome: Progressing   Problem: Clinical Measurements: Goal: Diagnostic test results will improve Outcome: Progressing Goal: Signs and symptoms of infection will decrease Outcome: Progressing   Problem: Clinical Measurements: Goal: Ability to maintain clinical measurements within normal limits will improve Outcome: Progressing Goal: Will remain free from infection Outcome: Progressing Goal: Diagnostic test results will improve Outcome: Progressing Goal: Respiratory complications will improve Outcome: Progressing Goal: Cardiovascular complication will be avoided Outcome: Progressing   Problem: Elimination: Goal: Will not experience complications related to bowel motility Outcome: Progressing Goal: Will not experience complications related to urinary retention Outcome: Progressing   Problem: Pain Managment: Goal: General experience of comfort will improve and/or be controlled Outcome: Progressing   Problem: Safety: Goal: Ability to remain free from injury will improve Outcome: Progressing   Problem: Skin Integrity: Goal: Risk for impaired skin integrity will decrease Outcome: Progressing

## 2024-07-29 NOTE — Plan of Care (Signed)
 Brief Palliative Medicine Progress Note:  PMT following peripherally for needs/decline:  Medical records reviewed including progress notes, labs, imaging. No acute changes.  Goals are clear for DNR/DNI, treating the treatable, and hopeful discharge to SNF rehab with outpatient Palliative Care to follow - TOC previously consulted.   PMT will follow peripherally and visit with patient and family incrementally for goals of care discussions as appropriate and based on clinical course. If there are any imminent needs please call the service directly. Family also has PMT contact information should further needs arise.  Thank you for allowing PMT to assist in the care of this patient.  Prudence Heiny M. Claudene Carbon [facility] Palliative Medicine Team Team Phone: [phone] NO CHARGE

## 2024-07-29 NOTE — Progress Notes (Addendum)
 PROGRESS NOTE  Andre Stout FMW:[id number] DOB: 1940-11-20 DOA: 07/21/2024 PCP: Jason Leita Repine, FNP   LOS: 8 days   Brief Narrative / Interim history: 84 year old male with CHB status post pacemaker, cholecystitis with chronic percutaneous cholecystostomy tube, no fistulas, prostate cancer, CKD 3A, prior CVA, HTN, DM2 who comes into the hospital with confusion, malaise, found to have sepsis and DKA.  CT scan in the emergency room showed aspiration pneumonia  Subjective / 24h Interval events: Awake, alert, confused.  Voices no complaints  Assesement and Plan: Principal Problem:   Severe sepsis (HCC) Active Problems:   Heart block AV complete (HCC)   Acute encephalopathy   Benign essential HTN   History of stroke   Hyperlipidemia   Prostate cancer (HCC)   Prolonged QT interval   Uncontrolled type 2 diabetes mellitus with hyperglycemia, with long-term current use of insulin  (HCC)   Mobitz type 1 second degree atrioventricular block   Aspiration pneumonia (HCC)   Acute respiratory failure with hypoxia (HCC)   CKD (chronic kidney disease) stage 3, GFR 30-59 ml/min (HCC)   Subdural hematoma (HCC)   L1 vertebral fracture (HCC)   Biliary fistula   Moderate malnutrition (HCC)  Principal problem Severe sepsis due to Klebsiella bacteremia, also concern for community-acquired pneumonia -he presented with lactic acidosis, tachypnea, fever, encephalopathy and acute kidney injury.  Chest CT showed pneumonia he is status post 7 days of IV antibiotics, currently placed on cefadroxil . He is afebrile, sepsis physiology resolved, white count normalized.  Based on sensitivities he is now on cefadroxil , plan for total of 10 days  Active problems Acute kidney injury - CKD ruled out, creatinine 1.7 on admission, improved to 0.9, baseline.  Due to sepsis.   Acute metabolic encephalopathy -The patient has baseline dementia, no formal diagnosis.  At presentation, he was poorly responsive, this  improved to baseline confusion, memory loss with treatment.    DM2, poorly controlled, with hyperglycemia, DKA on admission -CBGs much better.  Continue long and short acting insulin   Lab Results  Component Value Date   HGBA1C 10.7 (H) 07/21/2024   CBG (last 3)  Recent Labs    07/28/24 2315 07/29/24 0426 07/29/24 0738  GLUCAP 238* 163* 175*    Hypertension - BP controlled off medicaitons.  Continue to hold home amlodipine    L1 compression fracture - Maintain TLSO brace when out of bed    Cerebrovascular disease, Hyperlipidemia - Hold aspirin  and Plavix  given subdural hematoma.  Continue Lipitor  Complete heart block - Status post pacemaker   Prostate cancer - noted  Hypokalemia, hypomagnesemia -continue to monitor and replace as indicated   Hypophosphatemia -Resolved    Scheduled Meds:  atorvastatin   40 mg Oral Daily   cefadroxil   500 mg Oral BID   feeding supplement  237 mL Oral BID BM   insulin  aspart  0-9 Units Subcutaneous Q4H   insulin  glargine-yfgn  7 Units Subcutaneous QHS   multivitamin with minerals  1 tablet Oral Daily   pantoprazole   40 mg Oral Q supper   Continuous Infusions: PRN Meds:.acetaminophen  **OR** acetaminophen , albuterol , dextrose , fentaNYL  (SUBLIMAZE ) injection, HYDROcodone -acetaminophen   Current Outpatient Medications  Medication Instructions   acetaminophen  (TYLENOL ) 1,000 mg, Daily PRN   amLODipine  (NORVASC ) 10 mg, Oral, Daily   aspirin  81 mg, Daily   atorvastatin  (LIPITOR) 40 mg, Oral, Daily   Azelastine  HCl 137 MCG/SPRAY SOLN 1 spray, Each Nare, 2 times daily PRN, Needs appt   Blood Glucose Monitoring Suppl (ACCU-CHEK GUIDE) w/Device KIT Use  As Directed   clopidogrel  (PLAVIX ) 75 mg, Oral, Daily   Continuous Blood Gluc Receiver (FREESTYLE LIBRE 3 READER) DEVI 1 each, Does not apply, Daily   Continuous Blood Gluc Sensor (FREESTYLE LIBRE 3 SENSOR) MISC 1 each, Does not apply, Every 14 days   insulin  aspart protamine - aspart (NOVOLOG   70/30 FLEXPEN) (70-30) 100 UNIT/ML FlexPen Inject under skin 30 units before b'fast and 15-20 units before dinner Take the insulin  10-15 min before meals.   Insulin  Pen Needle 32G X 4 MM MISC Use to inject insulin  3 times a day   Insulin  Syringe-Needle U-100 (BD INSULIN  SYRINGE U/F) 31G X 5/16 1 ML MISC USE 2 DAILY   magnesium  oxide (MAG-OX) 400 mg, Oral, 2 times daily   Multiple Vitamins-Minerals (PRESERVISION AREDS 2) CAPS 1 capsule, Daily at bedtime   ONETOUCH DELICA LANCETS 33G MISC Use to check blood sugar 4 times per day. Dx code: E11.9   ONETOUCH ULTRA test strip USE TO MONITOR GLUCOSE LEVELS 4 TIMES PER DAY E11.9   pantoprazole  (PROTONIX ) 40 mg, Oral, 2 times daily before meals    Diet Orders (From admission, onward)     Start     Ordered   07/22/24 1023  DIET - DYS 1 Room service appropriate? No; Fluid consistency: Thin  Diet effective now       Question Answer Comment  Room service appropriate? No   Fluid consistency: Thin      07/22/24 1023            DVT prophylaxis: SCDs Start: 07/22/24 0029   Lab Results  Component Value Date   PLT 502 (H) 07/29/2024      Code Status: Limited: Do not attempt resuscitation (DNR) -DNR-LIMITED -Do Not Intubate/DNI   Family Communication: no family at bedside   Status is: Inpatient Remains inpatient appropriate because: severity of illness  Level of care: Progressive  Consultants:  none  Objective: Vitals:   07/28/24 2024 07/28/24 2316 07/29/24 0425 07/29/24 0738  BP: 117/60 (!) 111/58 131/72 (!) 148/72  Pulse: 65 66 65 65  Resp: 18 18 18 19   Temp: 98.9 F (37.2 C) 99.6 F (37.6 C) 98.3 F (36.8 C) (!) 97.4 F (36.3 C)  TempSrc: Oral Oral Oral Oral  SpO2: 99% 99% 100% 97%  Weight:      Height:        Intake/Output Summary (Last 24 hours) at 07/29/2024 0903 Last data filed at 07/29/2024 0700 Gross per 24 hour  Intake 660 ml  Output 1200 ml  Net -540 ml   Wt Readings from Last 3 Encounters:  07/21/24 72.6  kg  05/07/24 72.6 kg  03/03/24 77.1 kg    Examination:  Constitutional: NAD Eyes: no scleral icterus ENMT: Mucous membranes are moist.  Neck: normal, supple Respiratory: clear to auscultation bilaterally, no wheezing, no crackles.  Cardiovascular: Regular rate and rhythm, no murmurs / rubs / gallops. No LE edema.  Abdomen: non distended, no tenderness. Bowel sounds positive.  Musculoskeletal: no clubbing / cyanosis.   Data Reviewed: I have independently reviewed following labs and imaging studies   CBC Recent Labs  Lab 07/23/24 0312 07/24/24 0318 07/28/24 0831 07/29/24 0424  WBC 13.7* 10.4 9.0 9.4  HGB 9.7* 9.1* 9.9* 9.8*  HCT 30.3* 28.7* 31.9* 32.1*  PLT 374 391 445* 502*  MCV 77.9* 77.8* 78.4* 78.3*  MCH 24.9* 24.7* 24.3* 23.9*  MCHC 32.0 31.7 31.0 30.5  RDW 18.2* 18.5* 18.8* 19.2*    Recent Labs  Lab  07/22/24 1600 07/23/24 0312 07/24/24 0318 07/28/24 0831 07/29/24 0424  NA  --  138 140 135 135  K  --  3.4* 3.8 3.9 4.0  CL  --  103 104 103 104  CO2  --  23 26 25 26   GLUCOSE  --  104* 121* 112* 160*  BUN  --  13 17 13 15   CREATININE  --  1.19 1.28* 0.93 0.97  CALCIUM   --  9.1 8.8* 8.4* 8.6*  AST  --   --   --   --  22  ALT  --   --   --   --  16  ALKPHOS  --   --   --   --  71  BILITOT  --   --   --   --  0.4  ALBUMIN   --  2.0* 1.8* 1.7* 1.7*  MG  --  1.6* 1.8 1.3*  --   LATICACIDVEN 1.8  --   --   --   --     ------------------------------------------------------------------------------------------------------------------ No results for input(s): CHOL, HDL, LDLCALC, TRIG, CHOLHDL, LDLDIRECT in the last 72 hours.  Lab Results  Component Value Date   HGBA1C 10.7 (H) 07/21/2024   ------------------------------------------------------------------------------------------------------------------ No results for input(s): TSH, T4TOTAL, T3FREE, THYROIDAB in the last 72 hours.  Invalid input(s): FREET3  Cardiac Enzymes No results  for input(s): CKMB, TROPONINI, MYOGLOBIN in the last 168 hours.  Invalid input(s): CK ------------------------------------------------------------------------------------------------------------------    Component Value Date/Time   BNP 151.7 (H) 07/31/2022 2013    CBG: Recent Labs  Lab 07/28/24 1528 07/28/24 2024 07/28/24 2315 07/29/24 0426 07/29/24 0738  GLUCAP 297* 303* 238* 163* 175*    Recent Results (from the past 240 hours)  Culture, blood (Routine x 2)     Status: Abnormal   Collection Time: 07/21/24  6:00 PM   Specimen: BLOOD  Result Value Ref Range Status   Specimen Description BLOOD RIGHT ANTECUBITAL  Final   Special Requests   Final    BOTTLES DRAWN AEROBIC AND ANAEROBIC Blood Culture adequate volume   Culture  Setup Time   Final    GRAM NEGATIVE RODS AEROBIC BOTTLE ONLY CRITICAL RESULT CALLED TO, READ BACK BY AND VERIFIED WITH: [facility] MADELINE MITCHELL [phone] AT 1316 BY EC Performed at [facility], [address] [place], [place] [address]    Culture KLEBSIELLA PNEUMONIAE (A)  Final   Report Status 07/26/2024 FINAL  Final   Organism ID, Bacteria KLEBSIELLA PNEUMONIAE  Final   Organism ID, Bacteria KLEBSIELLA PNEUMONIAE  Final      Susceptibility   Klebsiella pneumoniae - KIRBY BAUER*    CEFAZOLIN INTERMEDIATE Intermediate    Klebsiella pneumoniae - MIC*    AMPICILLIN  >=32 RESISTANT Resistant     CEFEPIME  <=0.12 SENSITIVE Sensitive     CEFTAZIDIME <=1 SENSITIVE Sensitive     CEFTRIAXONE  <=0.25 SENSITIVE Sensitive     CIPROFLOXACIN <=0.25 SENSITIVE Sensitive     GENTAMICIN <=1 SENSITIVE Sensitive     IMIPENEM <=0.25 SENSITIVE Sensitive     TRIMETH/SULFA <=20 SENSITIVE Sensitive     AMPICILLIN /SULBACTAM 4 SENSITIVE Sensitive     PIP/TAZO <=4 SENSITIVE Sensitive ug/mL    * KLEBSIELLA PNEUMONIAE    KLEBSIELLA PNEUMONIAE  Blood Culture ID Panel (Reflexed)     Status: Abnormal   Collection Time: 07/21/24  6:00 PM  Result Value Ref Range  Status   Enterococcus faecalis NOT DETECTED NOT DETECTED Final   Enterococcus Faecium NOT DETECTED NOT  DETECTED Final   Listeria monocytogenes NOT DETECTED NOT DETECTED Final   Staphylococcus species NOT DETECTED NOT DETECTED Final   Staphylococcus aureus (BCID) NOT DETECTED NOT DETECTED Final   Staphylococcus epidermidis NOT DETECTED NOT DETECTED Final   Staphylococcus lugdunensis NOT DETECTED NOT DETECTED Final   Streptococcus species NOT DETECTED NOT DETECTED Final   Streptococcus agalactiae NOT DETECTED NOT DETECTED Final   Streptococcus pneumoniae NOT DETECTED NOT DETECTED Final   Streptococcus pyogenes NOT DETECTED NOT DETECTED Final   A.calcoaceticus-baumannii NOT DETECTED NOT DETECTED Final   Bacteroides fragilis NOT DETECTED NOT DETECTED Final   Enterobacterales DETECTED (A) NOT DETECTED Final    Comment: Enterobacterales represent a large order of gram negative bacteria, not a single organism. CRITICAL RESULT CALLED TO, READ BACK BY AND VERIFIED WITH: PHAR[facility] MADELINE MITCHELL [phone] AT 1316 BY EC    Enterobacter cloacae complex NOT DETECTED NOT DETECTED Final   Escherichia coli NOT DETECTED NOT DETECTED Final   Klebsiella aerogenes NOT DETECTED NOT DETECTED Final   Klebsiella oxytoca NOT DETECTED NOT DETECTED Final   Klebsiella pneumoniae DETECTED (A) NOT DETECTED Final    Comment: CRITICAL RESULT CALLED TO, READ BACK BY AND VERIFIED WITH: PHARMD MADELINE MITCHELL [phone] AT 1316 BY EC    Proteus species NOT DETECTED NOT DETECTED Final   Salmonella species NOT DETECTED NOT DETECTED Final   Serratia marcescens NOT DETECTED NOT DETECTED Final   Haemophilus influenzae NOT DETECTED NOT DETECTED Final   Neisseria meningitidis NOT DETECTED NOT DETECTED Final   Pseudomonas aeruginosa NOT DETECTED NOT DETECTED Final   Stenotrophomonas maltophilia NOT DETECTED NOT DETECTED Final   Candida albicans NOT DETECTED NOT DETECTED Final   Candida auris NOT DETECTED NOT DETECTED Final    Candida glabrata NOT DETECTED NOT DETECTED Final   Candida krusei NOT DETECTED NOT DETECTED Final   Candida parapsilosis NOT DETECTED NOT DETECTED Final   Candida tropicalis NOT DETECTED NOT DETECTED Final   Cryptococcus neoformans/gattii NOT DETECTED NOT DETECTED Final   CTX-M ESBL NOT DETECTED NOT DETECTED Final   Carbapenem resistance IMP NOT DETECTED NOT DETECTED Final   Carbapenem resistance KPC NOT DETECTED NOT DETECTED Final   Carbapenem resistance NDM NOT DETECTED NOT DETECTED Final   Carbapenem resist OXA 48 LIKE NOT DETECTED NOT DETECTED Final   Carbapenem resistance VIM NOT DETECTED NOT DETECTED Final    Comment: Performed at [facility], [address] [place], [place] [address]  Culture, blood (Routine x 2)     Status: Abnormal   Collection Time: 07/21/24  6:10 PM   Specimen: BLOOD LEFT ARM  Result Value Ref Range Status   Specimen Description BLOOD LEFT ARM  Final   Special Requests   Final    BOTTLES DRAWN AEROBIC AND ANAEROBIC Blood Culture adequate volume   Culture  Setup Time   Final    GRAM POSITIVE COCCI IN CLUSTERS ANAEROBIC BOTTLE ONLY CRITICAL RESULT CALLED TO, READ BACK BY AND VERIFIED WITH: PHARMD LISA CURRAN ON 07/22/24 @ 2011 BY DRT    Culture (A)  Final    STAPHYLOCOCCUS CAPITIS THE SIGNIFICANCE OF ISOLATING THIS ORGANISM FROM A SINGLE SET OF BLOOD CULTURES WHEN MULTIPLE SETS ARE DRAWN IS UNCERTAIN. PLEASE NOTIFY THE MICROBIOLOGY DEPARTMENT WITHIN ONE WEEK IF SPECIATION AND SENSITIVITIES ARE REQUIRED. Performed at [facility], [address], [place], [place] [address]    Report Status 07/24/2024 FINAL  Final  Blood Culture ID Panel (Reflexed)     Status: Abnormal  Collection Time: 07/21/24  6:10 PM  Result Value Ref Range Status   Enterococcus faecalis NOT DETECTED NOT DETECTED Final   Enterococcus Faecium NOT DETECTED NOT DETECTED Final   Listeria monocytogenes NOT DETECTED NOT DETECTED Final   Staphylococcus species DETECTED (A) NOT  DETECTED Final    Comment: CRITICAL RESULT CALLED TO, READ BACK BY AND VERIFIED WITH: PHARMD LISA CURRAN ON 07/22/24 @ 2011 BY DRT    Staphylococcus aureus (BCID) NOT DETECTED NOT DETECTED Final   Staphylococcus epidermidis NOT DETECTED NOT DETECTED Final   Staphylococcus lugdunensis NOT DETECTED NOT DETECTED Final   Streptococcus species NOT DETECTED NOT DETECTED Final   Streptococcus agalactiae NOT DETECTED NOT DETECTED Final   Streptococcus pneumoniae NOT DETECTED NOT DETECTED Final   Streptococcus pyogenes NOT DETECTED NOT DETECTED Final   A.calcoaceticus-baumannii NOT DETECTED NOT DETECTED Final   Bacteroides fragilis NOT DETECTED NOT DETECTED Final   Enterobacterales NOT DETECTED NOT DETECTED Final   Enterobacter cloacae complex NOT DETECTED NOT DETECTED Final   Escherichia coli NOT DETECTED NOT DETECTED Final   Klebsiella aerogenes NOT DETECTED NOT DETECTED Final   Klebsiella oxytoca NOT DETECTED NOT DETECTED Final   Klebsiella pneumoniae NOT DETECTED NOT DETECTED Final   Proteus species NOT DETECTED NOT DETECTED Final   Salmonella species NOT DETECTED NOT DETECTED Final   Serratia marcescens NOT DETECTED NOT DETECTED Final   Haemophilus influenzae NOT DETECTED NOT DETECTED Final   Neisseria meningitidis NOT DETECTED NOT DETECTED Final   Pseudomonas aeruginosa NOT DETECTED NOT DETECTED Final   Stenotrophomonas maltophilia NOT DETECTED NOT DETECTED Final   Candida albicans NOT DETECTED NOT DETECTED Final   Candida auris NOT DETECTED NOT DETECTED Final   Candida glabrata NOT DETECTED NOT DETECTED Final   Candida krusei NOT DETECTED NOT DETECTED Final   Candida parapsilosis NOT DETECTED NOT DETECTED Final   Candida tropicalis NOT DETECTED NOT DETECTED Final   Cryptococcus neoformans/gattii NOT DETECTED NOT DETECTED Final    Comment: Performed at [facility], [address], [place], [place] [address]  Resp panel by RT-PCR (RSV, Flu A&B, Covid) Anterior Nasal Swab      Status: None   Collection Time: 07/21/24  6:17 PM   Specimen: Anterior Nasal Swab  Result Value Ref Range Status   SARS Coronavirus 2 by RT PCR NEGATIVE NEGATIVE Final   Influenza A by PCR NEGATIVE NEGATIVE Final   Influenza B by PCR NEGATIVE NEGATIVE Final    Comment: (NOTE) The Xpert Xpress SARS-CoV-2/FLU/RSV plus assay is intended as an aid in the diagnosis of influenza from Nasopharyngeal swab specimens and should not be used as a sole basis for treatment. Nasal washings and aspirates are unacceptable for Xpert Xpress SARS-CoV-2/FLU/RSV testing.  Fact Sheet for Patients: BloggerCourse.com  Fact Sheet for Healthcare Providers: SeriousBroker.it  This test is not yet approved or cleared by the [place]  FDA and has been authorized for detection and/or diagnosis of SARS-CoV-2 by FDA under an Emergency Use Authorization (EUA). This EUA will remain in effect (meaning this test can be used) for the duration of the COVID-19 declaration under Section 564(b)(1) of the Act, 21 U.S.C. section 360bbb-3(b)(1), unless the authorization is terminated or revoked.     Resp Syncytial Virus by PCR NEGATIVE NEGATIVE Final    Comment: (NOTE) Fact Sheet for Patients: BloggerCourse.com  Fact Sheet for Healthcare Providers: SeriousBroker.it  This test is not yet approved or cleared by the [place]  FDA and has been authorized for detection and/or diagnosis of SARS-CoV-2  by FDA under an Emergency Use Authorization (EUA). This EUA will remain in effect (meaning this test can be used) for the duration of the COVID-19 declaration under Section 564(b)(1) of the Act, 21 U.S.C. section 360bbb-3(b)(1), unless the authorization is terminated or revoked.  Performed at [facility], [address], [place], [place] [address]   Urine Culture (for pregnant, neutropenic or urologic patients or  patients with an indwelling urinary catheter)     Status: Abnormal   Collection Time: 07/22/24  4:26 AM   Specimen: Urine, Clean Catch  Result Value Ref Range Status   Specimen Description URINE, CLEAN CATCH  Final   Special Requests NONE  Final   Culture (A)  Final    <10,000 COLONIES/mL INSIGNIFICANT GROWTH Performed at [facility], [address] [place], [place] [address]    Report Status 07/23/2024 FINAL  Final  MRSA Next Gen by PCR, Nasal     Status: None   Collection Time: 07/22/24  4:26 AM   Specimen: Urine, Clean Catch; Nasal Swab  Result Value Ref Range Status   MRSA by PCR Next Gen NOT DETECTED NOT DETECTED Final    Comment: (NOTE) The GeneXpert MRSA Assay (FDA approved for NASAL specimens only), is one component of a comprehensive MRSA colonization surveillance program. It is not intended to diagnose MRSA infection nor to guide or monitor treatment for MRSA infections. Test performance is not FDA approved in patients less than 59 years old. Performed at [facility], [address], [place], [place] [address]      Radiology Studies: No results found.   Nilda Fendt, MD, PhD [facility]  Between 7 am - 7 pm I am available, please contact me via Amion (for emergencies) or Securechat (non urgent messages)  Between 7 pm - 7 am I am not available, please contact night coverage MD/APP via Amion

## 2024-07-29 NOTE — Plan of Care (Signed)
  Problem: Fluid Volume: Goal: Ability to maintain a balanced intake and output will improve Outcome: Progressing   Problem: Nutritional: Goal: Maintenance of adequate nutrition will improve Outcome: Progressing   Problem: Skin Integrity: Goal: Risk for impaired skin integrity will decrease Outcome: Progressing   Problem: Clinical Measurements: Goal: Respiratory complications will improve Outcome: Progressing Goal: Cardiovascular complication will be avoided Outcome: Progressing   Problem: Activity: Goal: Risk for activity intolerance will decrease Outcome: Progressing   Problem: Nutrition: Goal: Adequate nutrition will be maintained Outcome: Progressing   Problem: Coping: Goal: Level of anxiety will decrease Outcome: Progressing   Problem: Elimination: Goal: Will not experience complications related to bowel motility Outcome: Progressing Goal: Will not experience complications related to urinary retention Outcome: Progressing

## 2024-07-30 DIAGNOSIS — R6520 Severe sepsis without septic shock: Secondary | ICD-10-CM | POA: Diagnosis not present

## 2024-07-30 DIAGNOSIS — A419 Sepsis, unspecified organism: Secondary | ICD-10-CM | POA: Diagnosis not present

## 2024-07-30 LAB — COMPREHENSIVE METABOLIC PANEL WITH GFR
ALT: 20 U/L (ref 0–44)
AST: 29 U/L (ref 15–41)
Albumin: 1.7 g/dL — ABNORMAL LOW (ref 3.5–5.0)
Alkaline Phosphatase: 79 U/L (ref 38–126)
Anion gap: 9 (ref 5–15)
BUN: 18 mg/dL (ref 8–23)
CO2: 23 mmol/L (ref 22–32)
Calcium: 8.4 mg/dL — ABNORMAL LOW (ref 8.9–10.3)
Chloride: 104 mmol/L (ref 98–111)
Creatinine, Ser: 0.98 mg/dL (ref 0.61–1.24)
GFR, Estimated: >60 mL/min (ref >60)
Glucose, Bld: 219 mg/dL — ABNORMAL HIGH (ref 70–99)
Potassium: 4.5 mmol/L (ref 3.5–5.1)
Sodium: 136 mmol/L (ref 135–145)
Total Bilirubin: 0.5 mg/dL (ref 0.0–1.2)
Total Protein: 5.1 g/dL — ABNORMAL LOW (ref 6.5–8.1)

## 2024-07-30 LAB — CBC
HCT: 31.7 % — ABNORMAL LOW (ref 39.0–52.0)
Hemoglobin: 9.8 g/dL — ABNORMAL LOW (ref 13.0–17.0)
MCH: 24.3 pg — ABNORMAL LOW (ref 26.0–34.0)
MCHC: 30.9 g/dL (ref 30.0–36.0)
MCV: 78.7 fL — ABNORMAL LOW (ref 80.0–100.0)
Platelets: 477 K/uL — ABNORMAL HIGH (ref 150–400)
RBC: 4.03 MIL/uL — ABNORMAL LOW (ref 4.22–5.81)
RDW: 19.1 % — ABNORMAL HIGH (ref 11.5–15.5)
WBC: 9.2 K/uL (ref 4.0–10.5)
nRBC: 0 % (ref 0.0–0.2)

## 2024-07-30 LAB — GLUCOSE, CAPILLARY
Glucose-Capillary: 237 mg/dL — ABNORMAL HIGH (ref 70–99)
Glucose-Capillary: 175 mg/dL — ABNORMAL HIGH (ref 70–99)

## 2024-07-30 LAB — MAGNESIUM: Magnesium: 1.6 mg/dL — ABNORMAL LOW (ref 1.7–2.4)

## 2024-07-30 MED ORDER — CEFADROXIL 500 MG PO CAPS: 500.0000 mg | ORAL_CAPSULE | Freq: Two times a day (BID) | ORAL | Status: AC

## 2024-07-30 MED ORDER — HYDROCODONE-ACETAMINOPHEN 5-325 MG PO TABS: 1.0000 | ORAL_TABLET | ORAL | 0 refills | Status: DC | PRN

## 2024-07-30 NOTE — Progress Notes (Signed)
 Tried to call report to [facility], could not reach them, left voice message for call back

## 2024-07-30 NOTE — Plan of Care (Signed)
  Problem: Education: Goal: Ability to describe self-care measures that may prevent or decrease complications (Diabetes Survival Skills Education) will improve Outcome: Adequate for Discharge Goal: Individualized Educational Video(s) Outcome: Adequate for Discharge   Problem: Coping: Goal: Ability to adjust to condition or change in health will improve Outcome: Adequate for Discharge   Problem: Fluid Volume: Goal: Ability to maintain a balanced intake and output will improve Outcome: Adequate for Discharge   Problem: Health Behavior/Discharge Planning: Goal: Ability to identify and utilize available resources and services will improve Outcome: Adequate for Discharge Goal: Ability to manage health-related needs will improve Outcome: Adequate for Discharge   Problem: Metabolic: Goal: Ability to maintain appropriate glucose levels will improve Outcome: Adequate for Discharge   Problem: Nutritional: Goal: Maintenance of adequate nutrition will improve Outcome: Adequate for Discharge Goal: Progress toward achieving an optimal weight will improve Outcome: Adequate for Discharge   Problem: Skin Integrity: Goal: Risk for impaired skin integrity will decrease Outcome: Adequate for Discharge   Problem: Tissue Perfusion: Goal: Adequacy of tissue perfusion will improve Outcome: Adequate for Discharge   Problem: Fluid Volume: Goal: Hemodynamic stability will improve Outcome: Adequate for Discharge   Problem: Clinical Measurements: Goal: Diagnostic test results will improve Outcome: Adequate for Discharge Goal: Signs and symptoms of infection will decrease Outcome: Adequate for Discharge   Problem: Respiratory: Goal: Ability to maintain adequate ventilation will improve Outcome: Adequate for Discharge   Problem: Education: Goal: Knowledge of General Education information will improve Description: Including pain rating scale, medication(s)/side effects and non-pharmacologic  comfort measures Outcome: Adequate for Discharge   Problem: Health Behavior/Discharge Planning: Goal: Ability to manage health-related needs will improve Outcome: Adequate for Discharge   Problem: Clinical Measurements: Goal: Ability to maintain clinical measurements within normal limits will improve Outcome: Adequate for Discharge Goal: Will remain free from infection Outcome: Adequate for Discharge Goal: Diagnostic test results will improve Outcome: Adequate for Discharge Goal: Respiratory complications will improve Outcome: Adequate for Discharge Goal: Cardiovascular complication will be avoided Outcome: Adequate for Discharge   Problem: Activity: Goal: Risk for activity intolerance will decrease Outcome: Adequate for Discharge   Problem: Nutrition: Goal: Adequate nutrition will be maintained Outcome: Adequate for Discharge   Problem: Coping: Goal: Level of anxiety will decrease Outcome: Adequate for Discharge   Problem: Elimination: Goal: Will not experience complications related to bowel motility Outcome: Adequate for Discharge Goal: Will not experience complications related to urinary retention Outcome: Adequate for Discharge   Problem: Pain Managment: Goal: General experience of comfort will improve and/or be controlled Outcome: Adequate for Discharge   Problem: Safety: Goal: Ability to remain free from injury will improve Outcome: Adequate for Discharge   Problem: Skin Integrity: Goal: Risk for impaired skin integrity will decrease Outcome: Adequate for Discharge   Problem: Activity: Goal: Ability to tolerate increased activity will improve Outcome: Adequate for Discharge   Problem: Clinical Measurements: Goal: Ability to maintain a body temperature in the normal range will improve Outcome: Adequate for Discharge   Problem: Respiratory: Goal: Ability to maintain adequate ventilation will improve Outcome: Adequate for Discharge Goal: Ability to  maintain a clear airway will improve Outcome: Adequate for Discharge

## 2024-07-30 NOTE — TOC Progression Note (Signed)
 Transition of Care [facility]) - Progression Note    Patient Details  Name: Andre Stout MRN: [id number] Date of Birth: 1940-10-31  [facility]) CM/SW Contact  Almarie CHRISTELLA Goodie, [place] Phone Number: 07/30/2024, 10:42 AM  Clinical Narrative:   CSW spoke with daughter to discuss SNF options, daughter chose [facility]. CSW confirmed bed available with [facility], and contacted [facility] to request insurance authorization. CSW to follow.    Expected Discharge Plan: Skilled Nursing Facility Barriers to Discharge: Continued Medical Work up, English as a second language teacher               Expected Discharge Plan and Services     Post Acute Care Choice: Skilled Nursing Facility Living arrangements for the past 2 months: Single Family Home Expected Discharge Date: 07/30/24                           [facility] Agency: [facility]         Social Drivers of Health (SDOH) Interventions SDOH Screenings   Food Insecurity: Patient Unable To Answer (07/23/2024)  Housing: Unknown (07/23/2024)  Transportation Needs: Patient Unable To Answer (07/23/2024)  Utilities: Patient Unable To Answer (07/23/2024)  Alcohol Screen: Low Risk  (07/24/2023)  Depression (PHQ2-9): Low Risk  (07/24/2023)  Financial Resource Strain: Low Risk  (07/24/2023)  Physical Activity: Inactive (07/24/2023)  Social Connections: Patient Unable To Answer (07/23/2024)  Stress: No Stress Concern Present (07/24/2023)  Tobacco Use: Medium Risk (05/07/2024)  Health Literacy: Adequate Health Literacy (07/24/2023)    Readmission Risk Interventions    08/15/2022    9:25 AM  Readmission Risk Prevention Plan  Transportation Screening Complete  PCP or Specialist Appt within 5-7 Days Complete  Home Care Screening Complete  Medication Review (RN CM) Complete

## 2024-07-30 NOTE — TOC Transition Note (Signed)
 Transition of Care [facility]) - Discharge Note   Patient Details  Name: Andre Stout MRN: [id number] Date of Birth: 01/11/1940  Transition of Care [facility]) CM/SW Contact:  Almarie CHRISTELLA Goodie, LCSW Phone Number: 07/30/2024, 10:43 AM   Clinical Narrative:   Patient received insurance authorization to admit to [facility] and [facility] has a bed available today. CSW sent discharge information to [facility], confirmed receipt. CSW spoke with daughter, Michaelle Amble, she is in agreement. Transport arranged with [facility] for next available.  Nurse to call report to [phone], Room 410A    Final next level of care: Skilled Nursing Facility Barriers to Discharge: Barriers Resolved   Patient Goals and CMS Choice     Choice offered to / list presented to : Adult Children [place] ownership interest in [facility].provided to:: Adult Children    Discharge Placement              Patient chooses bed at: [facility] Patient to be transferred to facility by: [facility] Name of family member notified: Michaelle Amble Patient and family notified of of transfer: 07/30/24  Discharge Plan and Services Additional resources added to the After Visit Summary for       Post Acute Care Choice: Skilled Nursing Facility                      [facility] Agency: [facility]        Social Drivers of Health (SDOH) Interventions SDOH Screenings   Food Insecurity: Patient Unable To Answer (07/23/2024)  Housing: Unknown (07/23/2024)  Transportation Needs: Patient Unable To Answer (07/23/2024)  Utilities: Patient Unable To Answer (07/23/2024)  Alcohol Screen: Low Risk  (07/24/2023)  Depression (PHQ2-9): Low Risk  (07/24/2023)  Financial Resource Strain: Low Risk  (07/24/2023)  Physical Activity: Inactive (07/24/2023)  Social Connections: Patient Unable To Answer (07/23/2024)  Stress: No Stress Concern Present (07/24/2023)  Tobacco Use: Medium Risk (05/07/2024)  Health Literacy: Adequate Health  Literacy (07/24/2023)     Readmission Risk Interventions    08/15/2022    9:25 AM  Readmission Risk Prevention Plan  Transportation Screening Complete  PCP or Specialist Appt within 5-7 Days Complete  Home Care Screening Complete  Medication Review (RN CM) Complete

## 2024-07-30 NOTE — Discharge Summary (Signed)
 Physician Discharge Summary  Andre Stout FMW:[id number] DOB: 1940-04-28 DOA: 07/21/2024  PCP: Jason Leita Repine, FNP  Admit date: 07/21/2024 Discharge date: 07/30/2024  Admitted From: home Disposition:  SNF  Recommendations for Outpatient Follow-up:  Follow up with PCP in 1-2 weeks Please obtain BMP/CBC in one week Please have outpatient palliative follow-up  Home Health: none Equipment/Devices: none  Discharge Condition: stable CODE STATUS: DNR Diet Orders (From admission, onward)     Start     Ordered   07/22/24 1023  DIET - DYS 1 Room service appropriate? No; Fluid consistency: Thin  Diet effective now       Question Answer Comment  Room service appropriate? No   Fluid consistency: Thin      07/22/24 1023            Brief Narrative / Interim history: 84 year old male with CHB status post pacemaker, cholecystitis with chronic percutaneous cholecystostomy tube, no fistulas, prostate cancer, CKD 3A, prior CVA, HTN, DM2 who comes into the hospital with confusion, malaise, found to have sepsis and DKA.  CT scan in the emergency room showed aspiration pneumonia  Hospital Course / Discharge diagnoses: Principal Problem:   Severe sepsis (HCC) Active Problems:   Heart block AV complete (HCC)   Acute encephalopathy   Benign essential HTN   History of stroke   Hyperlipidemia   Prostate cancer (HCC)   Prolonged QT interval   Uncontrolled type 2 diabetes mellitus with hyperglycemia, with long-term current use of insulin  (HCC)   Mobitz type 1 second degree atrioventricular block   Aspiration pneumonia (HCC)   Acute respiratory failure with hypoxia (HCC)   CKD (chronic kidney disease) stage 3, GFR 30-59 ml/min (HCC)   Subdural hematoma (HCC)   L1 vertebral fracture (HCC)   Biliary fistula   Moderate malnutrition (HCC)   Principal problem Severe sepsis due to Klebsiella bacteremia, also concern for community-acquired pneumonia -he presented with lactic  acidosis, tachypnea, fever, encephalopathy and acute kidney injury.  Chest CT showed pneumonia he is status post 7 days of IV antibiotics, currently placed on cefadroxil . He is afebrile, sepsis physiology resolved, white count normalized.  Based on sensitivities he is now on cefadroxil , plan for total of 10 days with 2 days remaining at the time of discharge  Active problems Acute kidney injury - CKD ruled out, creatinine 1.7 on admission, improved to 0.9, baseline.  Due to sepsis. Acute metabolic encephalopathy -The patient has baseline dementia, no formal diagnosis.  At presentation, he was poorly responsive, this improved to baseline confusion, memory loss with treatment.   Acute left-sided subdural hematoma without midline shift - CT on admission showed 8 mm subdural hematoma.  Repeat CT showed stability.  Neurosurgery evaluated the patient and recommended conservative management. Hold aspirin  and Plavix , defer to outpatient when this can be resumed. Prophylactic Keppra  for 7 days now stopped.  Discussed with DM2, poorly controlled, with hyperglycemia, DKA on admission -continue home regimen Hypertension -resume home regimen L1 compression fracture - Maintain TLSO brace when out of bed  Cerebrovascular disease, Hyperlipidemia - Hold aspirin  and Plavix  given subdural hematoma.  Continue Lipitor Complete heart block - Status post pacemaker Prostate cancer - noted Hypokalemia, hypomagnesemia -continue to monitor and replace as indicated Hypophosphatemia -Resolved  Discharge Instructions   Allergies as of 07/30/2024   No Known Allergies      Medication List     STOP taking these medications    aspirin  81 MG chewable tablet   clopidogrel  75 MG  tablet Commonly known as: PLAVIX        TAKE these medications    Accu-Chek Guide w/Device Kit Use As Directed   acetaminophen  500 MG tablet Commonly known as: TYLENOL  Take 1,000 mg by mouth daily as needed (pain).   amLODipine  10 MG  tablet Commonly known as: NORVASC  Take 1 tablet (10 mg total) by mouth daily.   atorvastatin  40 MG tablet Commonly known as: LIPITOR TAKE 1 TABLET BY MOUTH EVERY DAY   Azelastine  HCl 137 MCG/SPRAY Soln PLACE 1 SPRAY INTO BOTH NOSTRILS 2 (TWO) TIMES DAILY AS NEEDED. NEEDS APPT   cefadroxil  500 MG capsule Commonly known as: DURICEF Take 1 capsule (500 mg total) by mouth 2 (two) times daily for 2 days.   FreeStyle Libre 3 Reader Poynor 1 each by Does not apply route daily.   FreeStyle Libre 3 Sensor Misc 1 each by Does not apply route every 14 (fourteen) days.   HYDROcodone -acetaminophen  5-325 MG tablet Commonly known as: NORCO/VICODIN Take 1-2 tablets by mouth every 4 (four) hours as needed for moderate pain (pain score 4-6).   Insulin  Pen Needle 32G X 4 MM Misc Use to inject insulin  3 times a day   Insulin  Syringe-Needle U-100 31G X 5/16 1 ML Misc Commonly known as: BD Insulin  Syringe U/F USE 2 DAILY   magnesium  oxide 400 (240 Mg) MG tablet Commonly known as: MAG-OX TAKE 1 TABLET BY MOUTH TWICE A DAY What changed: when to take this   NovoLOG  70/30 FlexPen (70-30) 100 UNIT/ML FlexPen Generic drug: insulin  aspart protamine - aspart Inject under skin 30 units before b'fast and 15-20 units before dinner Take the insulin  10-15 min before meals.   OneTouch Delica Lancets 33G Misc Use to check blood sugar 4 times per day. Dx code: E11.9   OneTouch Ultra test strip Generic drug: glucose blood USE TO MONITOR GLUCOSE LEVELS 4 TIMES PER DAY E11.9   pantoprazole  40 MG tablet Commonly known as: PROTONIX  Take 1 tablet (40 mg total) by mouth 2 (two) times daily before a meal.   PreserVision AREDS 2 Caps Take 1 capsule by mouth at bedtime.         Procedures/Studies: none  CT HEAD WO CONTRAST ( ) Result Date: 07/22/2024 EXAM: CT HEAD WITHOUT CONTRAST 07/22/2024 03:19:55 AM TECHNIQUE: CT of the head was performed without the administration of intravenous contrast.  Automated exposure control, iterative reconstruction, and/or weight based adjustment of the mA/kV was utilized to reduce the radiation dose to as low as reasonably achievable. COMPARISON: 07/21/2024 CLINICAL HISTORY: Subdural hematoma. FINDINGS: BRAIN AND VENTRICLES: 6 mm left posterior convexity acute subdural hematoma is unchanged. Old right cerebellar infarct. Moderate volume loss and chronic ischemic white matter changes. ORBITS: No acute abnormality. SINUSES: No acute abnormality. SOFT TISSUES AND SKULL: No acute soft tissue abnormality. No skull fracture. IMPRESSION: 1. Unchanged 6 mm left posterior convexity acute subdural hematoma. 2. Old right cerebellar infarct. 3. Moderate volume loss and chronic ischemic white matter changes. Electronically signed by: Franky Stanford MD 07/22/2024 03:25 AM EDT RP Workstation: [id number]   CT CHEST ABDOMEN PELVIS WO CONTRAST Result Date: 07/21/2024 EXAM: CT CHEST, ABDOMEN AND PELVIS WITHOUT CONTRAST 07/21/2024 09:21:31 PM TECHNIQUE: CT of the chest, abdomen and pelvis was performed without the administration of intravenous contrast. Multiplanar reformatted images are provided for review. Automated exposure control, iterative reconstruction, and/or weight based adjustment of the mA/kV was utilized to reduce the radiation dose to as low as reasonably achievable. COMPARISON: CT abdomen dated 03/03/24. CLINICAL HISTORY: Sepsis,  hyperglycemia and weakness. Daughter endorses increased weakness. Taking all of insulin . CBG 452. Hx of DKA. Baseline able to walk with a walker, alert but unable to answer orientation questions. No dx of dementia. FINDINGS: Motion degraded images. CHEST: MEDIASTINUM: Heart and pericardium are unremarkable. The central airways are clear. Mild 3-vessel coronary atherosclerosis. Mild thoracic aortic atherosclerosis. THORACIC LYMPH NODES: No mediastinal, hilar or axillary lymphadenopathy. LUNGS AND PLEURA: Multifocal patchy right lung opacities, suspicious  for pneumonia possibly on the basis of aspiration. No pleural effusion or pneumothorax. ABDOMEN AND PELVIS: LIVER: The liver is unremarkable. GALLBLADDER AND BILE DUCTS: Layering small gallstones (image 86), without associated inflammatory changes. No biliary ductal dilatation. SPLEEN: No acute abnormality. PANCREAS: No acute abnormality. ADRENAL GLANDS: No acute abnormality. KIDNEYS, URETERS AND BLADDER: 10.0 cm simple right renal cyst (image 67), benign (Bosniak 1). Per consensus, no follow-up is needed for simple Bosniak type 1 and 2 renal cysts, unless the patient has a malignancy history or risk factors. No stones in the kidneys or ureters. No hydronephrosis. No perinephric or periureteral stranding. Moderate nondependent gas in the bladder, correlate for a cystitis versus instrumentation. GI AND BOWEL: Stomach demonstrates no acute abnormality. There is no bowel obstruction. Appendix is not discretely visualized. REPRODUCTIVE ORGANS: Brachytherapy seeds in the prostate. PERITONEUM AND RETROPERITONEUM: No ascites. No free air. VASCULATURE: Atherosclerotic calcifications of the abdominal aorta and branch vessels. ABDOMINAL AND PELVIS LYMPH NODES: No lymphadenopathy. REPRODUCTIVE ORGANS: No acute abnormality. BONES AND SOFT TISSUES: Mild degenerative changes of the thoracic spine. Moderate compression fracture deformity at L1 (sagittal image 61), new from prior and possibly acute, with minimal retropulsion. IMPRESSION: 1. Multifocal patchy right lung opacities, suspicious for pneumonia, possibly on the basis of aspiration. 2. Moderate compression fracture deformity at L1, new from prior and possibly acute, with minimal retropulsion. 3. Moderate nondependent gas in the bladder, correlate for cystitis versus instrumentation. Electronically signed by: Pinkie Pebbles MD 07/21/2024 09:33 PM EDT RP Workstation: [id number]   CT Head Wo Contrast Result Date: 07/21/2024 EXAM: CT HEAD WITHOUT CONTRAST 07/21/2024  09:21:31 PM TECHNIQUE: CT of the head was performed without the administration of intravenous contrast. Automated exposure control, iterative reconstruction, and/or weight based adjustment of the mA/kV was utilized to reduce the radiation dose to as low as reasonably achievable. COMPARISON: 01/17/2024 CLINICAL HISTORY: Mental status change, unknown cause. Hyperglycemia and weakness. Daughter endorses increased weakness. Taking all of insulin . CBG 452. Hx of DKA. Baseline able to walk with a walker, alert but unable to answer orientation questions. No dx of dementia. FINDINGS: BRAIN AND VENTRICLES: Global cortical atrophy. Subcortical and periventricular small vessel ischemic changes. No midline shift. ORBITS: No acute abnormality. SINUSES: No acute abnormality. SOFT TISSUES AND SKULL: Left posterior convexity subdural hematoma, measuring up to 8 mm in thickness on coronal imaging, acute. Chronic opacification of the left mastoid air cells. IMPRESSION: 1. Acute left posterior convexity subdural hematoma, measuring up to 8 mm, without midline shift. 2. Critical Value/emergent results were called by telephone at the time of interpretation on 07/21/2024 at 2128 hrs to provider Dr Dreama, who verbally acknowledged these results. Electronically signed by: Pinkie Pebbles MD 07/21/2024 09:30 PM EDT RP Workstation: [id number]   DG Chest Port 1 View Result Date: 07/21/2024 CLINICAL DATA:  Sepsis weakness EXAM: PORTABLE CHEST 1 VIEW COMPARISON:  01/17/2024 FINDINGS: Left lung grossly clear. Patchy ill-defined airspace opacities in the right mid to upper lung. Cardiomegaly with aortic atherosclerosis. No pleural effusion or pneumothorax IMPRESSION: Patchy ill-defined airspace opacities in the right mid  to upper lung suspicious for pneumonia. Radiographic follow-up to resolution is recommended. Cardiomegaly Electronically Signed   By: Luke Bun M.D.   On: 07/21/2024 18:49     Subjective: - no chest pain,  shortness of breath, no abdominal pain, nausea or vomiting.   Discharge Exam: BP (!) 158/70 (BP Location: Left Arm)   Pulse 70   Temp 97.8 F (36.6 C) (Oral)   Resp 19   Ht 5' (1.524 m)   Wt 72.6 kg Comment: from May 2025 records  SpO2 100%   BMI 31.25 kg/m   General: Pt is alert, awake, not in acute distress Cardiovascular: RRR, S1/S2 +, no rubs, no gallops Respiratory: CTA bilaterally, no wheezing, no rhonchi Abdominal: Soft, NT, ND, bowel sounds + Extremities: no edema, no cyanosis    The results of significant diagnostics from this hospitalization (including imaging, microbiology, ancillary and laboratory) are listed below for reference.     Microbiology: Recent Results (from the past 240 hours)  Culture, blood (Routine x 2)     Status: Abnormal   Collection Time: 07/21/24  6:00 PM   Specimen: BLOOD  Result Value Ref Range Status   Specimen Description BLOOD RIGHT ANTECUBITAL  Final   Special Requests   Final    BOTTLES DRAWN AEROBIC AND ANAEROBIC Blood Culture adequate volume   Culture  Setup Time   Final    GRAM NEGATIVE RODS AEROBIC BOTTLE ONLY CRITICAL RESULT CALLED TO, READ BACK BY AND VERIFIED WITH: [facility] MADELINE MITCHELL [id number] AT 1316 BY EC Performed at [facility], [address], [place], [place] [address]    Culture KLEBSIELLA PNEUMONIAE (A)  Final   Report Status 07/26/2024 FINAL  Final   Organism ID, Bacteria KLEBSIELLA PNEUMONIAE  Final   Organism ID, Bacteria KLEBSIELLA PNEUMONIAE  Final      Susceptibility   Klebsiella pneumoniae - KIRBY BAUER*    CEFAZOLIN INTERMEDIATE Intermediate    Klebsiella pneumoniae - MIC*    AMPICILLIN  >=32 RESISTANT Resistant     CEFEPIME  <=0.12 SENSITIVE Sensitive     CEFTAZIDIME <=1 SENSITIVE Sensitive     CEFTRIAXONE  <=0.25 SENSITIVE Sensitive     CIPROFLOXACIN <=0.25 SENSITIVE Sensitive     GENTAMICIN <=1 SENSITIVE Sensitive     IMIPENEM <=0.25 SENSITIVE Sensitive     TRIMETH/SULFA <=20 SENSITIVE  Sensitive     AMPICILLIN /SULBACTAM 4 SENSITIVE Sensitive     PIP/TAZO <=4 SENSITIVE Sensitive ug/mL    * KLEBSIELLA PNEUMONIAE    KLEBSIELLA PNEUMONIAE  Blood Culture ID Panel (Reflexed)     Status: Abnormal   Collection Time: 07/21/24  6:00 PM  Result Value Ref Range Status   Enterococcus faecalis NOT DETECTED NOT DETECTED Final   Enterococcus Faecium NOT DETECTED NOT DETECTED Final   Listeria monocytogenes NOT DETECTED NOT DETECTED Final   Staphylococcus species NOT DETECTED NOT DETECTED Final   Staphylococcus aureus (BCID) NOT DETECTED NOT DETECTED Final   Staphylococcus epidermidis NOT DETECTED NOT DETECTED Final   Staphylococcus lugdunensis NOT DETECTED NOT DETECTED Final   Streptococcus species NOT DETECTED NOT DETECTED Final   Streptococcus agalactiae NOT DETECTED NOT DETECTED Final   Streptococcus pneumoniae NOT DETECTED NOT DETECTED Final   Streptococcus pyogenes NOT DETECTED NOT DETECTED Final   A.calcoaceticus-baumannii NOT DETECTED NOT DETECTED Final   Bacteroides fragilis NOT DETECTED NOT DETECTED Final   Enterobacterales DETECTED (A) NOT DETECTED Final    Comment: Enterobacterales represent a large order of gram negative bacteria, not a single organism. CRITICAL RESULT CALLED  TO, READ BACK BY AND VERIFIED WITH: [facility] MADELINE MITCHELL [phone] AT 1316 BY EC    Enterobacter cloacae complex NOT DETECTED NOT DETECTED Final   Escherichia coli NOT DETECTED NOT DETECTED Final   Klebsiella aerogenes NOT DETECTED NOT DETECTED Final   Klebsiella oxytoca NOT DETECTED NOT DETECTED Final   Klebsiella pneumoniae DETECTED (A) NOT DETECTED Final    Comment: CRITICAL RESULT CALLED TO, READ BACK BY AND VERIFIED WITH: [facility] MADELINE MITCHELL [phone] AT 1316 BY EC    Proteus species NOT DETECTED NOT DETECTED Final   Salmonella species NOT DETECTED NOT DETECTED Final   Serratia marcescens NOT DETECTED NOT DETECTED Final   Haemophilus influenzae NOT DETECTED NOT DETECTED Final    Neisseria meningitidis NOT DETECTED NOT DETECTED Final   Pseudomonas aeruginosa NOT DETECTED NOT DETECTED Final   Stenotrophomonas maltophilia NOT DETECTED NOT DETECTED Final   Candida albicans NOT DETECTED NOT DETECTED Final   Candida auris NOT DETECTED NOT DETECTED Final   Candida glabrata NOT DETECTED NOT DETECTED Final   Candida krusei NOT DETECTED NOT DETECTED Final   Candida parapsilosis NOT DETECTED NOT DETECTED Final   Candida tropicalis NOT DETECTED NOT DETECTED Final   Cryptococcus neoformans/gattii NOT DETECTED NOT DETECTED Final   CTX-M ESBL NOT DETECTED NOT DETECTED Final   Carbapenem resistance IMP NOT DETECTED NOT DETECTED Final   Carbapenem resistance KPC NOT DETECTED NOT DETECTED Final   Carbapenem resistance NDM NOT DETECTED NOT DETECTED Final   Carbapenem resist OXA 48 LIKE NOT DETECTED NOT DETECTED Final   Carbapenem resistance VIM NOT DETECTED NOT DETECTED Final    Comment: Performed at [facility], [address], [place], [place] [address]  Culture, blood (Routine x 2)     Status: Abnormal   Collection Time: 07/21/24  6:10 PM   Specimen: BLOOD LEFT ARM  Result Value Ref Range Status   Specimen Description BLOOD LEFT ARM  Final   Special Requests   Final    BOTTLES DRAWN AEROBIC AND ANAEROBIC Blood Culture adequate volume   Culture  Setup Time   Final    GRAM POSITIVE COCCI IN CLUSTERS ANAEROBIC BOTTLE ONLY CRITICAL RESULT CALLED TO, READ BACK BY AND VERIFIED WITH: PHARMD LISA CURRAN ON 07/22/24 @ 2011 BY DRT    Culture (A)  Final    STAPHYLOCOCCUS CAPITIS THE SIGNIFICANCE OF ISOLATING THIS ORGANISM FROM A SINGLE SET OF BLOOD CULTURES WHEN MULTIPLE SETS ARE DRAWN IS UNCERTAIN. PLEASE NOTIFY THE MICROBIOLOGY DEPARTMENT WITHIN ONE WEEK IF SPECIATION AND SENSITIVITIES ARE REQUIRED. Performed at [facility], [address], [place], [place] [address]    Report Status 07/24/2024 FINAL  Final  Blood Culture ID Panel (Reflexed)     Status: Abnormal    Collection Time: 07/21/24  6:10 PM  Result Value Ref Range Status   Enterococcus faecalis NOT DETECTED NOT DETECTED Final   Enterococcus Faecium NOT DETECTED NOT DETECTED Final   Listeria monocytogenes NOT DETECTED NOT DETECTED Final   Staphylococcus species DETECTED (A) NOT DETECTED Final    Comment: CRITICAL RESULT CALLED TO, READ BACK BY AND VERIFIED WITH: PHARMD LISA CURRAN ON 07/22/24 @ 2011 BY DRT    Staphylococcus aureus (BCID) NOT DETECTED NOT DETECTED Final   Staphylococcus epidermidis NOT DETECTED NOT DETECTED Final   Staphylococcus lugdunensis NOT DETECTED NOT DETECTED Final   Streptococcus species NOT DETECTED NOT DETECTED Final   Streptococcus agalactiae NOT DETECTED NOT DETECTED Final   Streptococcus pneumoniae NOT DETECTED NOT DETECTED Final  Streptococcus pyogenes NOT DETECTED NOT DETECTED Final   A.calcoaceticus-baumannii NOT DETECTED NOT DETECTED Final   Bacteroides fragilis NOT DETECTED NOT DETECTED Final   Enterobacterales NOT DETECTED NOT DETECTED Final   Enterobacter cloacae complex NOT DETECTED NOT DETECTED Final   Escherichia coli NOT DETECTED NOT DETECTED Final   Klebsiella aerogenes NOT DETECTED NOT DETECTED Final   Klebsiella oxytoca NOT DETECTED NOT DETECTED Final   Klebsiella pneumoniae NOT DETECTED NOT DETECTED Final   Proteus species NOT DETECTED NOT DETECTED Final   Salmonella species NOT DETECTED NOT DETECTED Final   Serratia marcescens NOT DETECTED NOT DETECTED Final   Haemophilus influenzae NOT DETECTED NOT DETECTED Final   Neisseria meningitidis NOT DETECTED NOT DETECTED Final   Pseudomonas aeruginosa NOT DETECTED NOT DETECTED Final   Stenotrophomonas maltophilia NOT DETECTED NOT DETECTED Final   Candida albicans NOT DETECTED NOT DETECTED Final   Candida auris NOT DETECTED NOT DETECTED Final   Candida glabrata NOT DETECTED NOT DETECTED Final   Candida krusei NOT DETECTED NOT DETECTED Final   Candida parapsilosis NOT DETECTED NOT DETECTED Final    Candida tropicalis NOT DETECTED NOT DETECTED Final   Cryptococcus neoformans/gattii NOT DETECTED NOT DETECTED Final    Comment: Performed at [facility], [address] [place], [place] [address]  Resp panel by RT-PCR (RSV, Flu A&B, Covid) Anterior Nasal Swab     Status: None   Collection Time: 07/21/24  6:17 PM   Specimen: Anterior Nasal Swab  Result Value Ref Range Status   SARS Coronavirus 2 by RT PCR NEGATIVE NEGATIVE Final   Influenza A by PCR NEGATIVE NEGATIVE Final   Influenza B by PCR NEGATIVE NEGATIVE Final    Comment: (NOTE) The Xpert Xpress SARS-CoV-2/FLU/RSV plus assay is intended as an aid in the diagnosis of influenza from Nasopharyngeal swab specimens and should not be used as a sole basis for treatment. Nasal washings and aspirates are unacceptable for Xpert Xpress SARS-CoV-2/FLU/RSV testing.  Fact Sheet for Patients: BloggerCourse.com  Fact Sheet for Healthcare Providers: SeriousBroker.it  This test is not yet approved or cleared by the [place]  FDA and has been authorized for detection and/or diagnosis of SARS-CoV-2 by FDA under an Emergency Use Authorization (EUA). This EUA will remain in effect (meaning this test can be used) for the duration of the COVID-19 declaration under Section 564(b)(1) of the Act, 21 U.S.C. section 360bbb-3(b)(1), unless the authorization is terminated or revoked.     Resp Syncytial Virus by PCR NEGATIVE NEGATIVE Final    Comment: (NOTE) Fact Sheet for Patients: BloggerCourse.com  Fact Sheet for Healthcare Providers: SeriousBroker.it  This test is not yet approved or cleared by the [place]  FDA and has been authorized for detection and/or diagnosis of SARS-CoV-2 by FDA under an Emergency Use Authorization (EUA). This EUA will remain in effect (meaning this test can be used) for the duration of the COVID-19  declaration under Section 564(b)(1) of the Act, 21 U.S.C. section 360bbb-3(b)(1), unless the authorization is terminated or revoked.  Performed at [facility], [address], [place], [place] [address]   Urine Culture (for pregnant, neutropenic or urologic patients or patients with an indwelling urinary catheter)     Status: Abnormal   Collection Time: 07/22/24  4:26 AM   Specimen: Urine, Clean Catch  Result Value Ref Range Status   Specimen Description URINE, CLEAN CATCH  Final   Special Requests NONE  Final   Culture (A)  Final    <10,000 COLONIES/mL INSIGNIFICANT  GROWTH Performed at [facility], [address], [place], [place] [address]    Report Status 07/23/2024 FINAL  Final  MRSA Next Gen by PCR, Nasal     Status: None   Collection Time: 07/22/24  4:26 AM   Specimen: Urine, Clean Catch; Nasal Swab  Result Value Ref Range Status   MRSA by PCR Next Gen NOT DETECTED NOT DETECTED Final    Comment: (NOTE) The GeneXpert MRSA Assay (FDA approved for NASAL specimens only), is one component of a comprehensive MRSA colonization surveillance program. It is not intended to diagnose MRSA infection nor to guide or monitor treatment for MRSA infections. Test performance is not FDA approved in patients less than 21 years old. Performed at [facility], [address] [place], [place] [address]      Labs: Basic Metabolic Panel: Recent Labs  Lab 07/24/24 0318 07/28/24 0831 07/29/24 0424 07/30/24 0406  NA 140 135 135 136  K 3.8 3.9 4.0 4.5  CL 104 103 104 104  CO2 26 25 26 23   GLUCOSE 121* 112* 160* 219*  BUN 17 13 15 18   CREATININE 1.28* 0.93 0.97 0.98  CALCIUM  8.8* 8.4* 8.6* 8.4*  MG 1.8 1.3*  --  1.6*  PHOS 3.0 2.4*  --   --    Liver Function Tests: Recent Labs  Lab 07/24/24 0318 07/28/24 0831 07/29/24 0424 07/30/24 0406  AST  --   --  22 29  ALT  --   --  16 20  ALKPHOS  --   --  71 79  BILITOT  --   --  0.4 0.5  PROT  --   --  5.2* 5.1*   ALBUMIN  1.8* 1.7* 1.7* 1.7*   CBC: Recent Labs  Lab 07/24/24 0318 07/28/24 0831 07/29/24 0424 07/30/24 0406  WBC 10.4 9.0 9.4 9.2  HGB 9.1* 9.9* 9.8* 9.8*  HCT 28.7* 31.9* 32.1* 31.7*  MCV 77.8* 78.4* 78.3* 78.7*  PLT 391 445* 502* 477*   CBG: Recent Labs  Lab 07/29/24 1511 07/29/24 2000 07/29/24 2329 07/30/24 0416 07/30/24 0739  GLUCAP 323* 380* 222* 237* 175*   Hgb A1c No results for input(s): HGBA1C in the last 72 hours. Lipid Profile No results for input(s): CHOL, HDL, LDLCALC, TRIG, CHOLHDL, LDLDIRECT in the last 72 hours. Thyroid  function studies No results for input(s): TSH, T4TOTAL, T3FREE, THYROIDAB in the last 72 hours.  Invalid input(s): FREET3 Urinalysis    Component Value Date/Time   COLORURINE YELLOW 07/21/2024 2350   APPEARANCEUR CLOUDY (A) 07/21/2024 2350   LABSPEC 1.016 07/21/2024 2350   PHURINE 5.0 07/21/2024 2350   GLUCOSEU >=500 (A) 07/21/2024 2350   HGBUR MODERATE (A) 07/21/2024 2350   BILIRUBINUR NEGATIVE 07/21/2024 2350   KETONESUR 5 (A) 07/21/2024 2350   PROTEINUR 30 (A) 07/21/2024 2350   UROBILINOGEN 1.0 10/04/2009 1109   NITRITE NEGATIVE 07/21/2024 2350   LEUKOCYTESUR LARGE (A) 07/21/2024 2350    FURTHER DISCHARGE INSTRUCTIONS:   Get Medicines reviewed and adjusted: Please take all your medications with you for your next visit with your Primary MD   Laboratory/radiological data: Please request your Primary MD to go over all hospital tests and procedure/radiological results at the follow up, please ask your Primary MD to get all Hospital records sent to his/her office.   In some cases, they will be blood work, cultures and biopsy results pending at the time of your discharge. Please request that your primary care M.D. goes through all the  records of your hospital data and follows up on these results.   Also Note the following: If you experience worsening of your admission symptoms, develop shortness of  breath, life threatening emergency, suicidal or homicidal thoughts you must seek medical attention immediately by calling 911 or calling your MD immediately  if symptoms less severe.   You must read complete instructions/literature along with all the possible adverse reactions/side effects for all the Medicines you take and that have been prescribed to you. Take any new Medicines after you have completely understood and accpet all the possible adverse reactions/side effects.    Do not drive when taking Pain medications or sleeping medications (Benzodaizepines)   Do not take more than prescribed Pain, Sleep and Anxiety Medications. It is not advisable to combine anxiety,sleep and pain medications without talking with your primary care practitioner   Special Instructions: If you have smoked or chewed Tobacco  in the last 2 yrs please stop smoking, stop any regular Alcohol  and or any Recreational drug use.   Wear Seat belts while driving.   Please note: You were cared for by a hospitalist during your hospital stay. Once you are discharged, your primary care physician will handle any further medical issues. Please note that NO REFILLS for any discharge medications will be authorized once you are discharged, as it is imperative that you return to your primary care physician (or establish a relationship with a primary care physician if you do not have one) for your post hospital discharge needs so that they can reassess your need for medications and monitor your lab values.  Time coordinating discharge: 40 minutes  SIGNED:  Nilda Fendt, MD, PhD 07/30/2024, 8:49 AM

## 2024-08-05 ENCOUNTER — Encounter

## 2024-08-05 ENCOUNTER — Telehealth: Payer: Self-pay | Admitting: *Deleted

## 2024-08-05 NOTE — Telephone Encounter (Signed)
 Pt was scheduled for AWV today. Spoke with daughter and she states pt is currently in a rehab facility from recent hospitalization from Sepsis/pneumonia, hematoma and lumbar fracture. She states they will call to r/s once he has been discharged.

## 2024-08-06 DIAGNOSIS — I10 Essential (primary) hypertension: Secondary | ICD-10-CM | POA: Diagnosis not present

## 2024-08-06 DIAGNOSIS — S065XAA Traumatic subdural hemorrhage with loss of consciousness status unknown, initial encounter: Secondary | ICD-10-CM | POA: Diagnosis not present

## 2024-08-06 DIAGNOSIS — E1165 Type 2 diabetes mellitus with hyperglycemia: Secondary | ICD-10-CM | POA: Diagnosis not present

## 2024-08-06 DIAGNOSIS — S32010D Wedge compression fracture of first lumbar vertebra, subsequent encounter for fracture with routine healing: Secondary | ICD-10-CM | POA: Diagnosis not present

## 2024-08-06 NOTE — Progress Notes (Signed)
 This encounter was created in error - please disregard.

## 2024-08-13 ENCOUNTER — Ambulatory Visit: Admitting: Internal Medicine

## 2024-09-01 NOTE — Telephone Encounter (Signed)
 Opened in error

## 2024-09-02 ENCOUNTER — Other Ambulatory Visit: Payer: Self-pay

## 2024-09-02 ENCOUNTER — Emergency Department (HOSPITAL_COMMUNITY)

## 2024-09-02 ENCOUNTER — Inpatient Hospital Stay (HOSPITAL_COMMUNITY)
Admission: EM | Admit: 2024-09-02 | Discharge: 2024-09-08 | DRG: 177 | Disposition: A | Discharge status: Skilled Nursing Facility | Source: Skilled Nursing Facility | Attending: Hospitalist | Admitting: Hospitalist

## 2024-09-02 ENCOUNTER — Observation Stay (HOSPITAL_COMMUNITY)

## 2024-09-02 DIAGNOSIS — E1122 Type 2 diabetes mellitus with diabetic chronic kidney disease: Secondary | ICD-10-CM | POA: Diagnosis present

## 2024-09-02 DIAGNOSIS — G9341 Metabolic encephalopathy: Secondary | ICD-10-CM | POA: Diagnosis not present

## 2024-09-02 DIAGNOSIS — D509 Iron deficiency anemia, unspecified: Secondary | ICD-10-CM | POA: Diagnosis present

## 2024-09-02 DIAGNOSIS — J690 Pneumonitis due to inhalation of food and vomit: Principal | ICD-10-CM | POA: Diagnosis present

## 2024-09-02 DIAGNOSIS — R1312 Dysphagia, oropharyngeal phase: Secondary | ICD-10-CM | POA: Diagnosis present

## 2024-09-02 DIAGNOSIS — Z9841 Cataract extraction status, right eye: Secondary | ICD-10-CM

## 2024-09-02 DIAGNOSIS — E11649 Type 2 diabetes mellitus with hypoglycemia without coma: Secondary | ICD-10-CM | POA: Diagnosis not present

## 2024-09-02 DIAGNOSIS — Z9842 Cataract extraction status, left eye: Secondary | ICD-10-CM

## 2024-09-02 DIAGNOSIS — E785 Hyperlipidemia, unspecified: Secondary | ICD-10-CM | POA: Diagnosis present

## 2024-09-02 DIAGNOSIS — J189 Pneumonia, unspecified organism: Secondary | ICD-10-CM | POA: Diagnosis present

## 2024-09-02 DIAGNOSIS — E1159 Type 2 diabetes mellitus with other circulatory complications: Secondary | ICD-10-CM | POA: Diagnosis present

## 2024-09-02 DIAGNOSIS — I5033 Acute on chronic diastolic (congestive) heart failure: Secondary | ICD-10-CM | POA: Diagnosis present

## 2024-09-02 DIAGNOSIS — Z8673 Personal history of transient ischemic attack (TIA), and cerebral infarction without residual deficits: Secondary | ICD-10-CM

## 2024-09-02 DIAGNOSIS — Z8701 Personal history of pneumonia (recurrent): Secondary | ICD-10-CM

## 2024-09-02 DIAGNOSIS — R112 Nausea with vomiting, unspecified: Secondary | ICD-10-CM | POA: Diagnosis not present

## 2024-09-02 DIAGNOSIS — F0390 Unspecified dementia without behavioral disturbance: Secondary | ICD-10-CM | POA: Diagnosis present

## 2024-09-02 DIAGNOSIS — D329 Benign neoplasm of meninges, unspecified: Secondary | ICD-10-CM | POA: Diagnosis present

## 2024-09-02 DIAGNOSIS — I152 Hypertension secondary to endocrine disorders: Secondary | ICD-10-CM | POA: Diagnosis present

## 2024-09-02 DIAGNOSIS — E871 Hypo-osmolality and hyponatremia: Secondary | ICD-10-CM | POA: Diagnosis present

## 2024-09-02 DIAGNOSIS — I442 Atrioventricular block, complete: Secondary | ICD-10-CM | POA: Diagnosis present

## 2024-09-02 DIAGNOSIS — Z8546 Personal history of malignant neoplasm of prostate: Secondary | ICD-10-CM

## 2024-09-02 DIAGNOSIS — Z833 Family history of diabetes mellitus: Secondary | ICD-10-CM

## 2024-09-02 DIAGNOSIS — E875 Hyperkalemia: Secondary | ICD-10-CM | POA: Diagnosis present

## 2024-09-02 DIAGNOSIS — R410 Disorientation, unspecified: Principal | ICD-10-CM

## 2024-09-02 DIAGNOSIS — N1831 Chronic kidney disease, stage 3a: Secondary | ICD-10-CM | POA: Diagnosis present

## 2024-09-02 DIAGNOSIS — Z87891 Personal history of nicotine dependence: Secondary | ICD-10-CM

## 2024-09-02 DIAGNOSIS — N179 Acute kidney failure, unspecified: Secondary | ICD-10-CM | POA: Diagnosis present

## 2024-09-02 DIAGNOSIS — Z66 Do not resuscitate: Secondary | ICD-10-CM | POA: Diagnosis present

## 2024-09-02 DIAGNOSIS — R4182 Altered mental status, unspecified: Secondary | ICD-10-CM | POA: Diagnosis not present

## 2024-09-02 DIAGNOSIS — E860 Dehydration: Secondary | ICD-10-CM | POA: Diagnosis present

## 2024-09-02 LAB — COMPREHENSIVE METABOLIC PANEL WITH GFR
ALT: 11 U/L (ref 0–44)
AST: 19 U/L (ref 15–41)
Albumin: 3.1 g/dL — ABNORMAL LOW (ref 3.5–5.0)
Alkaline Phosphatase: 68 U/L (ref 38–126)
Anion gap: 10 (ref 5–15)
BUN: 20 mg/dL (ref 8–23)
CO2: 22 mmol/L (ref 22–32)
Calcium: 9.1 mg/dL (ref 8.9–10.3)
Chloride: 95 mmol/L — ABNORMAL LOW (ref 98–111)
Creatinine, Ser: 1.64 mg/dL — ABNORMAL HIGH (ref 0.61–1.24)
GFR, Estimated: 41 mL/min — ABNORMAL LOW (ref >60)
Glucose, Bld: 85 mg/dL (ref 70–99)
Potassium: 5.3 mmol/L — ABNORMAL HIGH (ref 3.5–5.1)
Sodium: 127 mmol/L — ABNORMAL LOW (ref 135–145)
Total Bilirubin: 1 mg/dL (ref 0.0–1.2)
Total Protein: 6.8 g/dL (ref 6.5–8.1)

## 2024-09-02 LAB — URINALYSIS, W/ REFLEX TO CULTURE (INFECTION SUSPECTED)
Bacteria, UA: NONE SEEN
Bilirubin Urine: NEGATIVE
Glucose, UA: NEGATIVE mg/dL
Hgb urine dipstick: NEGATIVE
Ketones, ur: NEGATIVE mg/dL
Leukocytes,Ua: NEGATIVE
Nitrite: NEGATIVE
Protein, ur: NEGATIVE mg/dL
Specific Gravity, Urine: 1.006 (ref 1.005–1.030)
pH: 7 (ref 5.0–8.0)

## 2024-09-02 LAB — CBC WITH DIFFERENTIAL/PLATELET
Abs Immature Granulocytes: 0.04 K/uL (ref 0.00–0.07)
Basophils Absolute: 0.1 K/uL (ref 0.0–0.1)
Basophils Relative: 1 %
Eosinophils Absolute: 0 K/uL (ref 0.0–0.5)
Eosinophils Relative: 0 %
HCT: 34.2 % — ABNORMAL LOW (ref 39.0–52.0)
Hemoglobin: 10.8 g/dL — ABNORMAL LOW (ref 13.0–17.0)
Immature Granulocytes: 0 %
Lymphocytes Relative: 23 %
Lymphs Abs: 2.1 K/uL (ref 0.7–4.0)
MCH: 25.1 pg — ABNORMAL LOW (ref 26.0–34.0)
MCHC: 31.6 g/dL (ref 30.0–36.0)
MCV: 79.4 fL — ABNORMAL LOW (ref 80.0–100.0)
Monocytes Absolute: 0.7 K/uL (ref 0.1–1.0)
Monocytes Relative: 7 %
Neutro Abs: 6.3 K/uL (ref 1.7–7.7)
Neutrophils Relative %: 69 %
Platelets: 362 K/uL (ref 150–400)
RBC: 4.31 MIL/uL (ref 4.22–5.81)
RDW: 18.1 % — ABNORMAL HIGH (ref 11.5–15.5)
WBC: 9.2 K/uL (ref 4.0–10.5)
nRBC: 0 % (ref 0.0–0.2)

## 2024-09-02 LAB — CBG MONITORING, ED
Glucose-Capillary: 41 mg/dL — CL (ref 70–99)
Glucose-Capillary: 137 mg/dL — ABNORMAL HIGH (ref 70–99)
Glucose-Capillary: 138 mg/dL — ABNORMAL HIGH (ref 70–99)
Glucose-Capillary: 122 mg/dL — ABNORMAL HIGH (ref 70–99)
Glucose-Capillary: 103 mg/dL — ABNORMAL HIGH (ref 70–99)
Glucose-Capillary: 95 mg/dL (ref 70–99)

## 2024-09-02 LAB — PROCALCITONIN: Procalcitonin: <0.1 ng/mL

## 2024-09-02 LAB — PROTIME-INR
INR: 1.1 (ref 0.8–1.2)
Prothrombin Time: 14.9 s (ref 11.4–15.2)

## 2024-09-02 LAB — I-STAT CG4 LACTIC ACID, ED: Lactic Acid, Venous: 1.1 mmol/L (ref 0.5–1.9)

## 2024-09-02 LAB — GLUCOSE, CAPILLARY
Glucose-Capillary: 148 mg/dL — ABNORMAL HIGH (ref 70–99)
Glucose-Capillary: 155 mg/dL — ABNORMAL HIGH (ref 70–99)

## 2024-09-02 LAB — BRAIN NATRIURETIC PEPTIDE: B Natriuretic Peptide: 1092.1 pg/mL — ABNORMAL HIGH (ref 0.0–100.0)

## 2024-09-02 MED ORDER — SODIUM CHLORIDE 0.9 % IV SOLN
2.0000 g | INTRAVENOUS | Status: DC
  Administered 2024-09-02 – 2024-09-06 (×5): 2 g via INTRAVENOUS
  Filled 2024-09-02 (×6): qty 20

## 2024-09-02 MED ORDER — ACETAMINOPHEN 325 MG PO TABS: 650.0000 mg | ORAL_TABLET | Freq: Four times a day (QID) | ORAL | Status: DC | PRN

## 2024-09-02 MED ORDER — SODIUM CHLORIDE 0.9% FLUSH
3.0000 mL | Freq: Two times a day (BID) | INTRAVENOUS | Status: DC
  Administered 2024-09-02 – 2024-09-08 (×13): 3 mL via INTRAVENOUS

## 2024-09-02 MED ORDER — AMLODIPINE BESYLATE 10 MG PO TABS
10.0000 mg | ORAL_TABLET | Freq: Every day | ORAL | Status: DC
  Administered 2024-09-03 – 2024-09-08 (×4): 10 mg via ORAL
  Filled 2024-09-02 (×6): qty 1

## 2024-09-02 MED ORDER — DEXTROSE 50 % IV SOLN
INTRAVENOUS | Status: AC
  Filled 2024-09-02: qty 50

## 2024-09-02 MED ORDER — PANTOPRAZOLE SODIUM 40 MG PO TBEC
40.0000 mg | DELAYED_RELEASE_TABLET | Freq: Every day | ORAL | Status: DC
  Administered 2024-09-03 – 2024-09-08 (×6): 40 mg via ORAL
  Filled 2024-09-02 (×7): qty 1

## 2024-09-02 MED ORDER — SODIUM CHLORIDE 0.9 % IV BOLUS
500.0000 mL | Freq: Once | INTRAVENOUS | Status: AC
  Administered 2024-09-02: 500 mL via INTRAVENOUS

## 2024-09-02 MED ORDER — INSULIN ASPART 100 UNIT/ML IJ SOLN: 0.0000 [IU] | Freq: Every day | INTRAMUSCULAR | Status: DC

## 2024-09-02 MED ORDER — ACETAMINOPHEN 650 MG RE SUPP: 650.0000 mg | Freq: Four times a day (QID) | RECTAL | Status: DC | PRN

## 2024-09-02 MED ORDER — DOXYCYCLINE HYCLATE 100 MG PO TABS
100.0000 mg | ORAL_TABLET | Freq: Two times a day (BID) | ORAL | Status: DC
  Administered 2024-09-02 – 2024-09-08 (×12): 100 mg via ORAL
  Filled 2024-09-02 (×12): qty 1

## 2024-09-02 MED ORDER — GUAIFENESIN ER 600 MG PO TB12
600.0000 mg | ORAL_TABLET | Freq: Two times a day (BID) | ORAL | Status: DC
  Administered 2024-09-02 – 2024-09-08 (×12): 600 mg via ORAL
  Filled 2024-09-02 (×13): qty 1

## 2024-09-02 MED ORDER — LACTATED RINGERS IV SOLN: INTRAVENOUS | Status: DC

## 2024-09-02 MED ORDER — ALBUTEROL SULFATE (2.5 MG/3ML) 0.083% IN NEBU
2.5000 mg | INHALATION_SOLUTION | RESPIRATORY_TRACT | Status: DC | PRN
Start: 2024-09-02 — End: 2024-09-08

## 2024-09-02 MED ORDER — VANCOMYCIN HCL 1750 MG/350ML IV SOLN
1750.0000 mg | Freq: Once | INTRAVENOUS | Status: AC
  Administered 2024-09-02: 1750 mg via INTRAVENOUS
  Filled 2024-09-02: qty 350

## 2024-09-02 MED ORDER — METRONIDAZOLE 500 MG/100ML IV SOLN
500.0000 mg | Freq: Once | INTRAVENOUS | Status: AC
  Administered 2024-09-02: 500 mg via INTRAVENOUS
  Filled 2024-09-02: qty 100

## 2024-09-02 MED ORDER — DEXTROSE 50 % IV SOLN
1.0000 | INTRAVENOUS | Status: DC | PRN
  Administered 2024-09-02: 50 mL via INTRAVENOUS

## 2024-09-02 MED ORDER — VANCOMYCIN HCL IN DEXTROSE 1-5 GM/200ML-% IV SOLN: 1000.0000 mg | Freq: Once | INTRAVENOUS | Status: DC

## 2024-09-02 MED ORDER — ENOXAPARIN SODIUM 30 MG/0.3ML IJ SOSY
30.0000 mg | PREFILLED_SYRINGE | INTRAMUSCULAR | Status: DC
  Administered 2024-09-02 – 2024-09-03 (×2): 30 mg via SUBCUTANEOUS
  Filled 2024-09-02 (×2): qty 0.3

## 2024-09-02 MED ORDER — INSULIN ASPART 100 UNIT/ML IJ SOLN: 0.0000 [IU] | Freq: Three times a day (TID) | INTRAMUSCULAR | Status: DC

## 2024-09-02 MED ORDER — SODIUM CHLORIDE 0.9 % IV SOLN
2.0000 g | Freq: Once | INTRAVENOUS | Status: AC
  Administered 2024-09-02: 2 g via INTRAVENOUS
  Filled 2024-09-02: qty 12.5

## 2024-09-02 MED ORDER — ATORVASTATIN CALCIUM 40 MG PO TABS
40.0000 mg | ORAL_TABLET | Freq: Every morning | ORAL | Status: DC
  Administered 2024-09-03 – 2024-09-08 (×6): 40 mg via ORAL
  Filled 2024-09-02 (×6): qty 1

## 2024-09-02 MED ORDER — DEXTROSE 5 % IV SOLN: INTRAVENOUS | Status: DC

## 2024-09-02 NOTE — Progress Notes (Signed)
 Pt arrived to unit at this time. Oriented to self only. Unable to complete admission questions at this time. Tele NSR. Skin intact.

## 2024-09-02 NOTE — ED Provider Notes (Signed)
 [facility] Emergency Department Provider Note MRN:  [id number]  Arrival date & time: 09/02/24     Chief Complaint   Emesis and Altered Mental Status   History of Present Illness   Andre Stout is a 84 y.o. year-old male presents to the ED with chief complaint of confusion.  From SNF with increased confusion.  Recent admission for sepsis and bacteremia secondary to Klebsiella.  Patient reportedly had vomiting and increased confusion today.  He is unable to provide much by way of history..     Review of Systems  Pertinent positive and negative review of systems noted in HPI.    Physical Exam   Vitals:   09/02/24 0202  BP: 120/61  Pulse: 62  Resp: 16  Temp: 98.4 F (36.9 C)  SpO2: 96%    CONSTITUTIONAL:  chronically ill-appearing, NAD NEURO:  confused EYES:  eyes equal and reactive ENT/NECK:  Supple, no stridor  CARDIO:  normal rate, regular rhythm, appears well-perfused  PULM:  No respiratory distress, diminished GI/GU:  non-distended, no guarding or rebound MSK/SPINE:  No gross deformities, no edema, moves all extremities  SKIN:  no rash, atraumatic   *Additional and/or pertinent findings included in MDM below  Diagnostic and Interventional Summary    EKG Interpretation Date/Time:  Wednesday September 02 2024 02:20:13 EDT Ventricular Rate:  67 PR Interval:    QRS Duration:  88 QT Interval:  410 QTC Calculation: 433 R Axis:   -49  Text Interpretation: ** Critical Test Result: AV Block Sinus rhythm with A-V dissociation and Accelerated Junctional rhythm Left axis deviation Low voltage QRS Septal infarct , age undetermined Abnormal ECG When compared with ECG of 02-Sep-2024 02:11, PREVIOUS ECG IS PRESENT CHB similar to previous Confirmed by Carita Senior [phone]) on 09/02/2024 2:26:55 AM       Labs Reviewed  COMPREHENSIVE METABOLIC PANEL WITH GFR - Abnormal; Notable for the following components:      Result Value   Sodium 127 (*)     Potassium 5.3 (*)    Chloride 95 (*)    Creatinine, Ser 1.64 (*)    Albumin  3.1 (*)    GFR, Estimated 41 (*)    All other components within normal limits  CBC WITH DIFFERENTIAL/PLATELET - Abnormal; Notable for the following components:   Hemoglobin 10.8 (*)    HCT 34.2 (*)    MCV 79.4 (*)    MCH 25.1 (*)    RDW 18.1 (*)    All other components within normal limits  CULTURE, BLOOD (ROUTINE X 2)  CULTURE, BLOOD (ROUTINE X 2)  PROTIME-INR  URINALYSIS, W/ REFLEX TO CULTURE (INFECTION SUSPECTED)  I-STAT CG4 LACTIC ACID, ED    CT HEAD WO CONTRAST ( )  Final Result    DG Chest Port 1 View  Final Result    CT ABDOMEN PELVIS WO CONTRAST    (Results Pending)    Medications  lactated ringers  infusion (has no administration in time range)  ceFEPIme  (MAXIPIME ) 2 g in sodium chloride  0.9 % 100 mL IVPB (2 g Intravenous New Bag/Given 09/02/24 0410)  metroNIDAZOLE  (FLAGYL ) IVPB 500 mg (has no administration in time range)  vancomycin  (VANCOREADY) IVPB 1750 mg/350 mL (has no administration in time range)  sodium chloride  0.9 % bolus 500 mL (500 mLs Intravenous New Bag/Given 09/02/24 0341)     Procedures  /  Critical Care Procedures  ED Course and Medical Decision Making  I have reviewed the triage vital signs, the nursing notes, and  pertinent available records from the EMR.  Social Determinants Affecting Complexity of Care: Patient has no clinically significant social determinants affecting this chief complaint..   ED Course:    Medical Decision Making Patient here with increased confusion from SNF.  Had vomiting.  Recent admission for pneumonia and bacteremia.  Sodium is low at 127, increased creatinine, increased potassium.  Will give small volume of fluid.  He is not hypotensive.  Lactic is normal.  No leukocytosis.  Chest x-ray notable for patchy bilateral airspace disease and mild pulmonary edema.  EKG notable for complete heart block.  This is not new.  He is asymptomatic  from this.  He does not have a pacemaker.  Given his increased confusion, feel that admission is indicated.  Will consult hospitalist for admission.  I spoke with Dr. Franky, who recommends obtaining CT abdomen/pelvis.  Amount and/or Complexity of Data Reviewed Labs: ordered. Radiology: ordered.  Risk Prescription drug management. Decision regarding hospitalization.         Consultants: I consulted with Hospitalist, Dr. Franky, who is appreciated for admitting.   Treatment and Plan: Patient's exam and diagnostic results are concerning for pneumonia.  Feel that patient will need admission to the hospital for further treatment and evaluation.    Final Clinical Impressions(s) / ED Diagnoses     ICD-10-CM   1. Confusion  R41.0       ED Discharge Orders     None         Discharge Instructions Discussed with and Provided to Patient:   Discharge Instructions   None      Vicky Charleston, PA-C 09/02/24 0447    Carita Senior, MD 09/02/24 [phone]

## 2024-09-02 NOTE — ED Triage Notes (Signed)
 Pt BIB EMS from [facility] nursing facility. EMS reports pt has been vomiting since 5pm, EMS also reports per nursing facility staff pt has had increased confusion and reports confusion at baseline. Unknown how long increased confusion has been going on.   EMS VS cbg 93, 120/86, HR 69, 94% RA

## 2024-09-02 NOTE — ED Notes (Signed)
 Pt incontinent of urine. Pt changed into new diaper and gown. Sheets changed.

## 2024-09-02 NOTE — ED Notes (Signed)
 IV blew. Will attempt USPIV seeing previous hx of needing such.

## 2024-09-02 NOTE — ED Notes (Signed)
 Attempted second set of blood cultures x2 with no success.

## 2024-09-02 NOTE — Plan of Care (Signed)
   Problem: Education: Goal: Ability to describe self-care measures that may prevent or decrease complications (Diabetes Survival Skills Education) will improve Outcome: Not Progressing Goal: Individualized Educational Video(s) Outcome: Not Progressing

## 2024-09-02 NOTE — ED Notes (Signed)
Bed alarm on for safety.

## 2024-09-02 NOTE — H&P (Signed)
 History and Physical    Patient: Andre Stout FMW:[id number] DOB: [birth year] DOA: 09/02/2024 DOS: the patient was seen and examined on 09/02/2024 PCP: Jason Leita Repine, FNP  Patient coming from: Home  Chief Complaint:  Chief Complaint  Patient presents with   Emesis   Altered Mental Status   HPI: Andre Stout is a 84 y.o. male with medical history significant of CHB s/ppacemaker, HTN, DM2, prior CVA, cholecystitis with chronic percutaneous cholecystostomy tube, prostate cancer, CKD 3A presents with nausea and vomiting and confusion.  At baseline patient is alert and oriented only to self not to time and place usually as reported by his daughter over the phone.  Review of EMS report notes that the patient had been having nausea and vomiting since around 5 PM yesterday evening with increased confusion.  He was just recently hospitalized from 7/22-7/31 with sepsis due to Klebsiella bacteremia with concern for community-acquired pneumonia.   In the emergency department patient was noted to be afebrile with stable vital signs.  Labs significant for hemoglobin 10.8, sodium 127, potassium 5.3, chloride 95, BUN 20, creatinine 1.64.  Chest x-ray noted patchy bilateral airspace disease with mild pulmonary edema and cardiomegaly with aortic atherosclerotic catheterizations.  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  CT scan of the abdomen pelvis noted no acute intra-abdominal abnormality and mild to moderate right pleural effusion with mild left effusion.  Review of Systems: As mentioned in the history of present illness. All other systems reviewed and are negative. Past Medical History:  Diagnosis Date   Diabetes mellitus without complication (HCC)    Glaucoma    Heart block    complete heart block   Hypertension    Prostate cancer (HCC)    Been 3-4 years ago    Past Surgical History:  Procedure Laterality Date   CATARACT EXTRACTION Bilateral    INSERTION PROSTATE RADIATION SEED     IR EXCHANGE BILIARY DRAIN  08/19/2023   IR EXCHANGE BILIARY DRAIN  12/19/2023   IR EXCHANGE BILIARY DRAIN  01/20/2024   IR PERC CHOLECYSTOSTOMY  08/17/2022   IR RADIOLOGIST EVAL & MGMT  09/28/2022   IR RADIOLOGIST EVAL & MGMT  10/10/2022   Social History:  reports that he quit smoking about 49 years ago. His smoking use included cigarettes. He started smoking about 53 years ago. He has a 2 pack-year smoking history. He has never used smokeless tobacco. He reports that he does not drink alcohol and does not use drugs.  No Known Allergies  Family History  Problem Relation Age of Onset   Other Mother        unknown medical history   Diabetes Father    Diabetes Paternal Grandfather     Prior to Admission medications   Medication Sig Start Date End Date Taking? Authorizing Provider  acetaminophen  (TYLENOL ) 500 MG tablet Take 1,000 mg by mouth daily as needed (pain).    [provider]  amLODipine  (NORVASC ) 10 MG tablet Take 1 tablet (10 mg total) by mouth daily. 11/15/23   Jason Leita Repine, FNP  atorvastatin  (LIPITOR) 40 MG tablet TAKE 1 TABLET BY MOUTH EVERY DAY 07/16/24   Murray, Laura Woodruff, FNP  Azelastine  HCl 137 MCG/SPRAY SOLN PLACE 1 SPRAY INTO BOTH NOSTRILS 2 (TWO) TIMES DAILY AS NEEDED. NEEDS APPT 07/13/24   Jason Leita Repine, FNP  Blood Glucose Monitoring Suppl (ACCU-CHEK  GUIDE) w/Device KIT Use As Directed 08/24/22   Christobal Guadalajara, MD  Continuous Blood Gluc Receiver (FREESTYLE LIBRE 3 READER) DEVI 1 each by Does not apply route daily. 01/22/23   Trixie File, MD  Continuous Blood Gluc Sensor (FREESTYLE LIBRE 3 SENSOR) MISC 1 each by Does not apply route every 14 (fourteen) days. 01/22/23   Trixie File, MD  HYDROcodone -acetaminophen  (NORCO/VICODIN) 5-325 MG tablet Take 1-2 tablets by mouth every 4 (four) hours as needed for moderate  pain (pain score 4-6). 07/30/24   Gherghe, Costin M, MD  insulin  aspart protamine - aspart (NOVOLOG  70/30 FLEXPEN) (70-30) 100 UNIT/ML FlexPen Inject under skin 30 units before b'fast and 15-20 units before dinner Take the insulin  10-15 min before meals. 05/07/24   Trixie File, MD  Insulin  Pen Needle 32G X 4 MM MISC Use to inject insulin  3 times a day 11/15/23   Trixie File, MD  Insulin  Syringe-Needle U-100 (BD INSULIN  SYRINGE U/F) 31G X 5/16 1 ML MISC USE 2 DAILY 11/21/22   Trixie File, MD  magnesium  oxide (MAG-OX) 400 (240 Mg) MG tablet TAKE 1 TABLET BY MOUTH TWICE A DAY Patient taking differently: Take 1 tablet by mouth every evening. 02/28/24   Jason Leita Repine, FNP  Multiple Vitamins-Minerals (PRESERVISION AREDS 2) CAPS Take 1 capsule by mouth at bedtime.    [provider]  [facility] DELICA LANCETS 33G MISC Use to check blood sugar 4 times per day. Dx code: E11.9 03/27/16   Kassie Mallick, MD  [facility] ULTRA test strip USE TO MONITOR GLUCOSE LEVELS 4 TIMES PER DAY E11.9 06/10/19   Kassie Mallick, MD  pantoprazole  (PROTONIX ) 40 MG tablet Take 1 tablet (40 mg total) by mouth 2 (two) times daily before a meal. 11/15/23   Jason Leita Repine, FNP    Physical Exam: Vitals:   09/02/24 0202 09/02/24 0327 09/02/24 0600 09/02/24 0741  BP: 120/61  110/76   Pulse: 62  63   Resp: 16  17   Temp: 98.4 F (36.9 C)   98 F (36.7 C)  TempSrc: Oral     SpO2: 96%  99%   Weight:  72.6 kg    Height:  5' (1.524 m)     Constitutional: Chronically ill-appearing elderly male who is in no acute distress at this time but not really following commands Eyes: PERRL, lids and conjunctivae normal ENMT: Mucous membranes are moist.   Neck: normal, supple  Respiratory: Decreased overall aeration.  O2 saturations currently maintained on room air. Cardiovascular: Regular rate and rhythm, no murmurs / rubs / gallops. No extremity edema. 2+ pedal pulses. No carotid bruits.  Abdomen: no  tenderness, no masses palpated. No hepatosplenomegaly. Bowel sounds positive.  Musculoskeletal: no clubbing / cyanosis. No joint deformity upper and lower extremities. Good ROM, no contractures. Normal muscle tone.  Skin: no rashes, lesions, ulcers. No induration Neurologic: CN 2-12 grossly intact. Sensation intact, DTR normal. Strength 5/5 in all 4.  Psychiatric: Normal judgment and insight. Alert and oriented x 3. Normal mood.   Data Reviewed:  EKG revealed complete heart block at 62 bpm with borderline left axis deviation.  Reviewed labs, imaging, and pertinent records as documented.  Assessment and Plan:   Nausea and vomiting Acute.  Patient presented with reports of nausea and vomiting since 5 PM yesterday afternoon as well as increased confusion.  CT scan of the abdomen pelvis did not reveal any acute intra-abdominal cause. - Admit to a telemetry bed - N.p.o. - Check urinalysis - Antiemetics as  needed  Aspiration pneumonia Patient noted to have patchy bilateral airspace opacities on chest x-ray.  Thought likely secondary to episodes of nausea and vomiting prior to arrival at the nursing facility. - Aspiration precautions with elevation head of bed - Incentive spirometry and flutter valve - Check procalcitonin - Speech therapy consulted to evaluate - Continue empiric antibiotics of Rocephin  and doxycycline   Uncontrolled diabetes mellitus type 2 with hypoglycemia  Blood sugars noted to be as low as 41.  Hemoglobin A1c last noted to be 10.7 when checked on 07/21/2024.  Possibly related to patient being on long-acting insulin  and decreased p.o. intake due to nausea and vomiting. - Hypoglycemia protocols - Amp of D50 as needed for low blood sugars - D5 IV fluids at 50 mL/h while patient n.p.o. - CBGs every 6 hours    Acute metabolic encephalopathy Dementia Patient reported to be alert and oriented only to self at baseline for the most part per family.  CT scan of the head did not  reveal any acute abnormality.  No focal deficits appreciated at this time. - Delirium precautions  Acute kidney injury Patient presents with creatinine elevated up to 1.64 with BUN 28.  Baseline creatinine previously noted to be around 1.  This creatinine is 0.3 increased to suggest acute kidney injury.  Patient had been bolused 500 mL of lactated Ringer 's - Continue lactated ringer  IV fluids at 100 mL/h - Recheck BMP in a.m.  Essential hypertension Blood pressures elevated up to 142/62. - Continue amlodipine   Hyperkalemia Acute.  Potassium elevated at 5.3. - Recheck potassium  Hyponatremia Acute.  Sodium noted to be 127.  Baseline sodium noted to be within normal limits.  Suspect hypovolemic hyponatremia given reports of nausea and vomiting. - Recheck BMP - Continue IV fluids as  Microcytic hypochromic anemia Acute.  Hemoglobin noted to be 10.8. - Continue to monitor   Extra-axial mass Incidental finding noted on CT noted to be stable in appearance and thought to possibly be a meningioma.  DVT prophylaxis: Lovenox   Advance Care Planning:   Code Status: Limited: Do not attempt resuscitation (DNR) -DNR-LIMITED -Do Not Intubate/DNI     Consults: None  Family Communication: Daughter updated over the phone  Severity of Illness: The appropriate patient status for this patient is INPATIENT. Inpatient status is judged to be reasonable and necessary in order to provide the required intensity of service to ensure the patient's safety. The patient's presenting symptoms, physical exam findings, and initial radiographic and laboratory data in the context of their chronic comorbidities is felt to place them at high risk for further clinical deterioration. Furthermore, it is not anticipated that the patient will be medically stable for discharge from the hospital within 2 midnights of admission.   * I certify that at the point of admission it is my clinical judgment that the patient will  require inpatient hospital care spanning beyond 2 midnights from the point of admission due to high intensity of service, high risk for further deterioration and high frequency of surveillance required.*  Author: Maximino DELENA Sharps, MD 09/02/2024 8:36 AM  For on call review [email or web address].

## 2024-09-02 NOTE — Evaluation (Signed)
 Clinical/Bedside Swallow Evaluation Patient Details  Name: ASUNCION SHIBATA MRN: [id number] Date of Birth: [birth year]  Today's Date: 09/02/2024 Time: SLP Start Time (ACUTE ONLY): 1418 SLP Stop Time (ACUTE ONLY): 1431 SLP Time Calculation (min) (ACUTE ONLY): 13 min  Past Medical History:  Past Medical History:  Diagnosis Date   Diabetes mellitus without complication (HCC)    Glaucoma    Heart block    complete heart block   Hypertension    Prostate cancer (HCC)    Been 3-4 years ago   Past Surgical History:  Past Surgical History:  Procedure Laterality Date   CATARACT EXTRACTION Bilateral    INSERTION PROSTATE RADIATION SEED     IR EXCHANGE BILIARY DRAIN  08/19/2023   IR EXCHANGE BILIARY DRAIN  12/19/2023   IR EXCHANGE BILIARY DRAIN  01/20/2024   IR PERC CHOLECYSTOSTOMY  08/17/2022   IR RADIOLOGIST EVAL & MGMT  09/28/2022   IR RADIOLOGIST EVAL & MGMT  10/10/2022   HPI:  BRACH BIRDSALL is an 84 yo male presenting to ED from [facility] 9/3 with AMS and vomiting. CT shows patchy, streaky and ground-glass opacities within the bilateral lung bases. Seen most recently by SLP July 2025 (admitted with bacteremia and sepsis) with AMS resulting in cognitive dysphagia with biggest risk fo aspiration being post prandial given poor positioning, signs of GER, and recent history of vomiting.  PMH includes CHB s/p PPM, cholecystitis with chronic percutaneous cholecystostomy tube, prostate cancer, CKD 3A, prior CVA, HTN, T2DM    Assessment / Plan / Recommendation  Clinical Impression  Pt alert and confused, laying sideways on stretcher with his feet hanging over the side. He is disoriented to place, time, and siutation, frequently referencing people who are not present. Instances of coughing were noted with thin liquids particularly after prolonged oral holding. Neither oral holding nor coughing recurred with purees or solids but eructation consistently followed each swallow. This overall appears similar to most  recent evaluation from July 2025 with the risk of aspiration suspected to be primarily post-prandial due to his mentation, positioning, and symptoms of GER. Discussed with MD, who would like to proceed with an MBS. He is not expected to be able to consistently follow commands to use compensatory strategies or adhere to aspiration/esophageal precautions without full supervision. Pending MBS, recommend he remain NPO except meds crushed in puree. SLP will f/u.  SLP Visit Diagnosis: Dysphagia, unspecified (R13.10)    Aspiration Risk  Moderate aspiration risk    Diet Recommendation NPO except meds    Medication Administration: Crushed with puree    Other  Recommendations Oral Care Recommendations: Oral care QID     Assistance Recommended at Discharge    Functional Status Assessment Patient has had a recent decline in their functional status and demonstrates the ability to make significant improvements in function in a reasonable and predictable amount of time.  Frequency and Duration min 2x/week  2 weeks       Prognosis Prognosis for improved oropharyngeal function: Fair Barriers to Reach Goals: Cognitive deficits;Time post onset      Swallow Study   General HPI: CINCERE ZORN is an 84 yo male presenting to ED from [facility] 9/3 with AMS and vomiting. CT shows patchy, streaky and ground-glass opacities within the bilateral lung bases. Seen most recently by SLP July 2025 (admitted with bacteremia and sepsis) with AMS resulting in cognitive dysphagia with biggest risk fo aspiration being post prandial given poor positioning, signs of GER, and recent history of  vomiting.  PMH includes CHB s/p PPM, cholecystitis with chronic percutaneous cholecystostomy tube, prostate cancer, CKD 3A, prior CVA, HTN, T2DM Type of Study: Bedside Swallow Evaluation Previous Swallow Assessment: see HPI Diet Prior to this Study: NPO Temperature Spikes Noted: No Respiratory Status: Room air History of Recent Intubation:  No Behavior/Cognition: Alert;Cooperative;Confused;Requires cueing;Distractible Oral Cavity Assessment: Within Functional Limits Oral Care Completed by SLP: No Oral Cavity - Dentition: Dentures, top;Dentures, bottom Vision: Functional for self-feeding Self-Feeding Abilities: Needs assist Patient Positioning: Upright in bed Baseline Vocal Quality: Normal Volitional Cough: Strong Volitional Swallow: Able to elicit    Oral/Motor/Sensory Function Overall Oral Motor/Sensory Function: Within functional limits   Ice Chips Ice chips: Not tested   Thin Liquid Thin Liquid: Impaired Presentation: Straw Oral Phase Functional Implications: Oral holding Pharyngeal  Phase Impairments: Cough - Delayed    Nectar Thick Nectar Thick Liquid: Not tested   Honey Thick Honey Thick Liquid: Not tested   Puree Puree: Within functional limits Presentation: Spoon   Solid     Solid: Within functional limits      Damien Blumenthal, M.A., CCC-SLP Speech Language Pathology, Acute Rehabilitation Services  Secure Chat preferred [phone]  09/02/2024,4:10 PM

## 2024-09-03 ENCOUNTER — Observation Stay (HOSPITAL_COMMUNITY)

## 2024-09-03 DIAGNOSIS — I5033 Acute on chronic diastolic (congestive) heart failure: Secondary | ICD-10-CM | POA: Diagnosis present

## 2024-09-03 DIAGNOSIS — R4182 Altered mental status, unspecified: Secondary | ICD-10-CM | POA: Diagnosis present

## 2024-09-03 DIAGNOSIS — E871 Hypo-osmolality and hyponatremia: Secondary | ICD-10-CM | POA: Diagnosis present

## 2024-09-03 DIAGNOSIS — E785 Hyperlipidemia, unspecified: Secondary | ICD-10-CM | POA: Diagnosis present

## 2024-09-03 DIAGNOSIS — E875 Hyperkalemia: Secondary | ICD-10-CM | POA: Diagnosis present

## 2024-09-03 DIAGNOSIS — Z833 Family history of diabetes mellitus: Secondary | ICD-10-CM | POA: Diagnosis not present

## 2024-09-03 DIAGNOSIS — E1122 Type 2 diabetes mellitus with diabetic chronic kidney disease: Secondary | ICD-10-CM | POA: Diagnosis present

## 2024-09-03 DIAGNOSIS — E860 Dehydration: Secondary | ICD-10-CM | POA: Diagnosis present

## 2024-09-03 DIAGNOSIS — I152 Hypertension secondary to endocrine disorders: Secondary | ICD-10-CM | POA: Diagnosis present

## 2024-09-03 DIAGNOSIS — I442 Atrioventricular block, complete: Secondary | ICD-10-CM | POA: Diagnosis present

## 2024-09-03 DIAGNOSIS — E1159 Type 2 diabetes mellitus with other circulatory complications: Secondary | ICD-10-CM | POA: Diagnosis present

## 2024-09-03 DIAGNOSIS — J690 Pneumonitis due to inhalation of food and vomit: Secondary | ICD-10-CM | POA: Diagnosis present

## 2024-09-03 DIAGNOSIS — D329 Benign neoplasm of meninges, unspecified: Secondary | ICD-10-CM | POA: Diagnosis present

## 2024-09-03 DIAGNOSIS — Z8673 Personal history of transient ischemic attack (TIA), and cerebral infarction without residual deficits: Secondary | ICD-10-CM | POA: Diagnosis not present

## 2024-09-03 DIAGNOSIS — F0390 Unspecified dementia without behavioral disturbance: Secondary | ICD-10-CM | POA: Diagnosis present

## 2024-09-03 DIAGNOSIS — R1312 Dysphagia, oropharyngeal phase: Secondary | ICD-10-CM | POA: Diagnosis present

## 2024-09-03 DIAGNOSIS — E11649 Type 2 diabetes mellitus with hypoglycemia without coma: Secondary | ICD-10-CM | POA: Diagnosis present

## 2024-09-03 DIAGNOSIS — Z8546 Personal history of malignant neoplasm of prostate: Secondary | ICD-10-CM | POA: Diagnosis not present

## 2024-09-03 DIAGNOSIS — Z8701 Personal history of pneumonia (recurrent): Secondary | ICD-10-CM | POA: Diagnosis not present

## 2024-09-03 DIAGNOSIS — N179 Acute kidney failure, unspecified: Secondary | ICD-10-CM | POA: Diagnosis present

## 2024-09-03 DIAGNOSIS — D509 Iron deficiency anemia, unspecified: Secondary | ICD-10-CM | POA: Diagnosis present

## 2024-09-03 DIAGNOSIS — R112 Nausea with vomiting, unspecified: Secondary | ICD-10-CM | POA: Diagnosis not present

## 2024-09-03 DIAGNOSIS — Z66 Do not resuscitate: Secondary | ICD-10-CM | POA: Diagnosis present

## 2024-09-03 DIAGNOSIS — Z87891 Personal history of nicotine dependence: Secondary | ICD-10-CM | POA: Diagnosis not present

## 2024-09-03 DIAGNOSIS — G9341 Metabolic encephalopathy: Secondary | ICD-10-CM | POA: Diagnosis present

## 2024-09-03 DIAGNOSIS — N1831 Chronic kidney disease, stage 3a: Secondary | ICD-10-CM | POA: Diagnosis present

## 2024-09-03 LAB — CBC
HCT: 34 % — ABNORMAL LOW (ref 39.0–52.0)
Hemoglobin: 10.8 g/dL — ABNORMAL LOW (ref 13.0–17.0)
MCH: 24.8 pg — ABNORMAL LOW (ref 26.0–34.0)
MCHC: 31.8 g/dL (ref 30.0–36.0)
MCV: 78.2 fL — ABNORMAL LOW (ref 80.0–100.0)
Platelets: 336 K/uL (ref 150–400)
RBC: 4.35 MIL/uL (ref 4.22–5.81)
RDW: 17.8 % — ABNORMAL HIGH (ref 11.5–15.5)
WBC: 6.9 K/uL (ref 4.0–10.5)
nRBC: 0 % (ref 0.0–0.2)

## 2024-09-03 LAB — BASIC METABOLIC PANEL WITH GFR
Anion gap: 11 (ref 5–15)
BUN: 12 mg/dL (ref 8–23)
CO2: 20 mmol/L — ABNORMAL LOW (ref 22–32)
Calcium: 8.6 mg/dL — ABNORMAL LOW (ref 8.9–10.3)
Chloride: 97 mmol/L — ABNORMAL LOW (ref 98–111)
Creatinine, Ser: 1.21 mg/dL (ref 0.61–1.24)
GFR, Estimated: 59 mL/min — ABNORMAL LOW (ref >60)
Glucose, Bld: 183 mg/dL — ABNORMAL HIGH (ref 70–99)
Potassium: 4.6 mmol/L (ref 3.5–5.1)
Sodium: 128 mmol/L — ABNORMAL LOW (ref 135–145)

## 2024-09-03 LAB — GLUCOSE, CAPILLARY
Glucose-Capillary: 208 mg/dL — ABNORMAL HIGH (ref 70–99)
Glucose-Capillary: 192 mg/dL — ABNORMAL HIGH (ref 70–99)
Glucose-Capillary: 192 mg/dL — ABNORMAL HIGH (ref 70–99)
Glucose-Capillary: 139 mg/dL — ABNORMAL HIGH (ref 70–99)
Glucose-Capillary: 185 mg/dL — ABNORMAL HIGH (ref 70–99)

## 2024-09-03 MED ORDER — ENOXAPARIN SODIUM 40 MG/0.4ML IJ SOSY
40.0000 mg | PREFILLED_SYRINGE | INTRAMUSCULAR | Status: DC
  Administered 2024-09-04 – 2024-09-08 (×5): 40 mg via SUBCUTANEOUS
  Filled 2024-09-03 (×5): qty 0.4

## 2024-09-03 MED ORDER — TRAZODONE HCL 50 MG PO TABS
50.0000 mg | ORAL_TABLET | Freq: Once | ORAL | Status: AC | PRN
  Administered 2024-09-04: 50 mg via ORAL
  Filled 2024-09-03: qty 1

## 2024-09-03 MED ORDER — INSULIN ASPART 100 UNIT/ML IJ SOLN
0.0000 [IU] | INTRAMUSCULAR | Status: DC
  Administered 2024-09-03: 1 [IU] via SUBCUTANEOUS
  Administered 2024-09-03: 2 [IU] via SUBCUTANEOUS
  Administered 2024-09-03: 1 [IU] via SUBCUTANEOUS
  Administered 2024-09-04: 3 [IU] via SUBCUTANEOUS
  Administered 2024-09-04: 4 [IU] via SUBCUTANEOUS
  Administered 2024-09-04: 1 [IU] via SUBCUTANEOUS
  Administered 2024-09-04: 2 [IU] via SUBCUTANEOUS
  Administered 2024-09-05: 6 [IU] via SUBCUTANEOUS
  Administered 2024-09-05 (×2): 2 [IU] via SUBCUTANEOUS
  Administered 2024-09-06 (×2): 4 [IU] via SUBCUTANEOUS
  Administered 2024-09-06: 2 [IU] via SUBCUTANEOUS
  Administered 2024-09-06: 5 [IU] via SUBCUTANEOUS
  Administered 2024-09-06 – 2024-09-07 (×2): 3 [IU] via SUBCUTANEOUS
  Administered 2024-09-07: 4 [IU] via SUBCUTANEOUS
  Administered 2024-09-07: 3 [IU] via SUBCUTANEOUS
  Administered 2024-09-07 – 2024-09-08 (×2): 1 [IU] via SUBCUTANEOUS
  Administered 2024-09-08: 4 [IU] via SUBCUTANEOUS
  Administered 2024-09-08: 2 [IU] via SUBCUTANEOUS

## 2024-09-03 MED ORDER — HALOPERIDOL LACTATE 5 MG/ML IJ SOLN: 1.0000 mg | Freq: Once | INTRAMUSCULAR | Status: DC | PRN

## 2024-09-03 NOTE — Procedures (Signed)
 Modified Barium Swallow Study  Patient Details  Name: Andre Stout MRN: [id number] Date of Birth: Feb 03, 1940  Today's Date: 09/03/2024  Modified Barium Swallow completed.  Full report located under Chart Review in the Imaging Section.  History of Present Illness Andre Stout is an 84 yo male presenting to ED from [facility] 9/3 with AMS and vomiting. CT shows patchy, streaky and ground-glass opacities within the bilateral lung bases. Seen most recently by SLP July 2025 (admitted with bacteremia and sepsis) with AMS resulting in cognitive dysphagia with biggest risk fo aspiration being post prandial given poor positioning, signs of GER, and recent history of vomiting.  PMH includes CHB s/p PPM, cholecystitis with chronic percutaneous cholecystostomy tube, prostate cancer, CKD 3A, prior CVA, HTN, T2DM   Clinical Impression  Pt presents with a moderate oropharyngeal dysphagia per MBSS results today. There was delayed silent aspiration of thin liquid residue observed during the study. Pt was unable to follow directions for compensatory swallow strategies. No apparent aspiration of nectar-thick liquids, puree, or soft solid observed.   Oral deficits characterized by reduced oral strength and coordination resulting in pre-spill of majority of trials to the level of the pyriform sinuses before the swallow. Increased oral prep and transit time observed with soft solid.   Pharyngeal deficits characterized by reduced base of tongue retraction, reduced laryngeal vestibule closure, and reduced pharyngeal stripping.   Plan to start a mechanical altered/ground diet and nectar-thick liquids. Pt may be able to tolerate thin liquids with use of 5cc Provale cup since no penetration/aspiration was observed with spoon sips of thin liquids. Pt demonstrated oropharyngeal residue with both thin liquids and nectar-thick liquids and will be at an elevated risk of aspiration of residue of either consistency. Recommend follow  up discussion with pt's family to talk about thin vs nectar-thick liquids and risks/benefits of thickening liquids if considering thickened liquids for a longer term recommendation. Attempted to call family today, though they were unavailable.   SLP to follow up to assess diet tolerance and provide family education.   Factors that may increase risk of adverse event in presence of aspiration [name] & [name] 2021): Reduced cognitive function;Dependence for feeding and/or oral hygiene  Swallow Evaluation Recommendations Recommendations: PO diet PO Diet Recommendation: Dysphagia 2 (Finely chopped);Mildly thick liquids (Level 2, nectar thick) Liquid Administration via: Cup;Straw Medication Administration: Crushed with puree Supervision: Full assist for feeding;Full supervision/cueing for swallowing strategies Swallowing strategies  : Minimize environmental distractions;Slow rate;Small bites/sips Postural changes: Position pt fully upright for meals Oral care recommendations: Oral care QID (4x/day)      [name] [name] J [name] 09/03/2024,3:58 PM

## 2024-09-03 NOTE — Plan of Care (Signed)
   Problem: Education: Goal: Ability to describe self-care measures that may prevent or decrease complications (Diabetes Survival Skills Education) will improve Outcome: Not Progressing Goal: Individualized Educational Video(s) Outcome: Not Progressing

## 2024-09-03 NOTE — Plan of Care (Signed)
  Problem: Coping: Goal: Ability to adjust to condition or change in health will improve Outcome: Progressing   Problem: Metabolic: Goal: Ability to maintain appropriate glucose levels will improve Outcome: Progressing   Problem: Skin Integrity: Goal: Risk for impaired skin integrity will decrease Outcome: Progressing   Problem: Tissue Perfusion: Goal: Adequacy of tissue perfusion will improve Outcome: Progressing   

## 2024-09-03 NOTE — Progress Notes (Signed)
 PROGRESS NOTE    Andre Stout  FMW:[id number] DOB: 08-24-1940 DOA: 09/02/2024 PCP: Andre Leita Repine, FNP   Brief Narrative:  HPI: Andre Stout is a 84 y.o. male with medical history significant of CHB s/ppacemaker, HTN, DM2, prior CVA, cholecystitis with chronic percutaneous cholecystostomy tube, prostate cancer, CKD 3A presents with nausea and vomiting and confusion.   At baseline patient is alert and oriented only to self not to time and place usually as reported by his daughter over the phone.  Review of EMS report notes that the patient had been having nausea and vomiting since around 5 PM yesterday evening with increased confusion.   He was just recently hospitalized from 7/22-7/31 with sepsis due to Klebsiella bacteremia with concern for community-acquired pneumonia.     In the emergency department patient was noted to be afebrile with stable vital signs.  Labs significant for hemoglobin 10.8, sodium 127, potassium 5.3, chloride 95, BUN 20, creatinine 1.64.  Chest x-ray noted patchy bilateral airspace disease with mild pulmonary edema and cardiomegaly with aortic atherosclerotic catheterizations.  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  CT scan of the abdomen pelvis noted no acute intra-abdominal abnormality and mild to moderate right pleural effusion with mild left effusion.  Assessment & Plan:   Principal Problem:   Nausea and vomiting Active Problems:   Aspiration pneumonia (HCC)   Uncontrolled diabetes mellitus with hypoglycemia (HCC)   Acute metabolic encephalopathy   AKI (acute kidney injury) (HCC)   Hypertension associated with diabetes (HCC)   Hyperkalemia   Hyponatremia   Microcytic anemia  Nausea and vomiting Source unclear.  Patient unable to provide much of the history due to dementia and no reports of nausea vomiting by nursing  this morning.  Continue symptomatic treatment with antiemetics.  Consult SLP.   Aspiration pneumonia Patient noted to have patchy bilateral airspace opacities on chest x-ray.  Thought likely secondary to episodes of nausea and vomiting prior to arrival at the nursing facility.  Aspiration precautions.  Continue Rocephin  and doxycycline .  SLP consulted. - Aspiration precautions with elevation head of bed  Acute metabolic encephalopathy: Discussed with daughter, patient is only alert to himself, could not tell me date of birth, day, year or where he is at.  He was able to tell me that he is in [place].  According to daughter, this is pretty much at baseline for him.  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  At times he would recognize his family members.  He did have hand mittens.  Sounds like patient is improving.  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  However patient not hypoxic and not symptomatic.  Will watch for now but avoid IV fluids.  Uncontrolled diabetes mellitus type 2 with hypoglycemia  Blood sugars noted to be as low as 41.  Hemoglobin A1c last noted to be 10.7 when checked on 07/21/2024.  Possibly related to patient being on long-acting insulin  and decreased p.o. intake due to nausea and vomiting.  Appears to be taking Lantus  15 units twice daily along with metformin  at home.  Both on hold.  Patient on dextrose  fluid.  Blood sugar 180 this morning.  Will stop dextrose .  Start on sensitive scale SSI every 4 hours while he is NPO.  Acute metabolic encephalopathy Dementia  Patient reported to be alert and oriented only to self at baseline for the most part per family.  CT scan of the head did not reveal any acute abnormality.  No focal deficits appreciated at this time. - Delirium precautions   Acute kidney injury Patient presents with creatinine  elevated up to 1.64 with BUN 28.  Baseline creatinine previously noted to be around 1.  Now resolved.   Essential hypertension Blood pressures fairly controlled, continue amlodipine .  Hyperlipidemia: Continue atorvastatin .   Hyperkalemia Resolved.  Hyponatremia Acute, 128 and improved today.  Likely due to pneumonia.   Microcytic hypochromic anemia Hemoglobin stable.  Extra-axial mass Incidental finding noted on CT noted to be stable in appearance and thought to possibly be a meningioma.  Per radiology official report, it is unchanged from the CT scan compared from 07/22/2024.  Discussed with the daughter, they are not aware of any history of meningioma.  However patient DNR, 84 year old with multiple medical problems and advanced dementia, there is no emergent indication for any intervention.  We will just observe him.  Daughter in full agreement.  DVT prophylaxis: enoxaparin  (LOVENOX ) injection 30 mg Start: 09/02/24 0900   Code Status: Limited: Do not attempt resuscitation (DNR) -DNR-LIMITED -Do Not Intubate/DNI   Family Communication:  None present at bedside.  Plan of care discussed with patient's daughter over the phone.  Status is: Observation The patient will require care spanning > 2 midnights and should be moved to inpatient because: Patient n.p.o., needs to be evaluated by SLP.   Estimated body mass index is 31.26 kg/m as calculated from the following:   Height as of this encounter: 5' (1.524 m).   Weight as of this encounter: 72.6 kg.    Nutritional Assessment: Body mass index is 31.26 kg/m.[name] Seen by dietician.  I agree with the assessment and plan as outlined below: Nutrition Status:        . Skin Assessment: I have examined the patient's skin and I agree with the wound assessment as performed by the wound care RN as outlined below:    Consultants:  None  Procedures:  None  Antimicrobials:  Anti-infectives (From admission, onward)    Start      Dose/Rate Route Frequency Ordered Stop   09/02/24 2200  doxycycline  (VIBRA -TABS) tablet 100 mg        100 mg Oral Every 12 hours 09/02/24 1740     09/02/24 1830  cefTRIAXone  (ROCEPHIN ) 2 g in sodium chloride  0.9 % 100 mL IVPB        2 g 200 mL/hr over 30 Minutes Intravenous Every 24 hours 09/02/24 1734     09/02/24 0345  ceFEPIme  (MAXIPIME ) 2 g in sodium chloride  0.9 % 100 mL IVPB        2 g 200 mL/hr over 30 Minutes Intravenous  Once 09/02/24 0333 09/02/24 0443   09/02/24 0345  metroNIDAZOLE  (FLAGYL ) IVPB 500 mg        500 mg 100 mL/hr over 60 Minutes Intravenous  Once 09/02/24 0333 09/02/24 0540   09/02/24 0345  vancomycin  (VANCOCIN ) IVPB 1000 mg/200 mL premix  Status:  Discontinued        1,000 mg 200 mL/hr over 60 Minutes Intravenous  Once 09/02/24 0333 09/02/24 0336   09/02/24 0345  vancomycin  (VANCOREADY) IVPB 1750 mg/350 mL        1,750 mg 175 mL/hr over 120 Minutes Intravenous  Once 09/02/24 0336 09/02/24 1057         Subjective: Patient seen  and examined, patient had hand mittens.  He was alert and oriented to self.  No complaints.  Appeared comfortable.  Objective: Vitals:   09/02/24 1719 09/02/24 2051 09/03/24 0043 09/03/24 0344  BP: (!) 142/62 123/62 (!) 157/76 (!) 153/69  Pulse: (!) 58 61 60 62  Resp: 18 20 19 18   Temp: 97.8 F (36.6 C) 97.8 F (36.6 C) 98.1 F (36.7 C) 97.9 F (36.6 C)  TempSrc:  Oral Oral Oral  SpO2: 98% 99% 100% 99%  Weight:      Height:        Intake/Output Summary (Last 24 hours) at 09/03/2024 0759 Last data filed at 09/03/2024 0349 Gross per 24 hour  Intake 1793.22 ml  Output 400 ml  Net 1393.22 ml   Filed Weights   09/02/24 0327  Weight: 72.6 kg    Examination:  General exam: Appears calm and comfortable  Respiratory system: Clear to auscultation. Respiratory effort normal. Cardiovascular system: S1 & S2 heard, RRR. No JVD, murmurs, rubs, gallops or clicks. No pedal edema. Gastrointestinal system: Abdomen is nondistended,  soft and nontender. No organomegaly or masses felt. Normal bowel sounds heard. Central nervous system: Alert and oriented x 1. No focal neurological deficits. Extremities: Symmetric 5 x 5 power. Skin: No rashes, lesions or ulcers Psychiatry: Judgement and insight appear poor  Data Reviewed: I have personally reviewed following labs and imaging studies  CBC: Recent Labs  Lab 09/02/24 0240 09/03/24 0157  WBC 9.2 6.9  NEUTROABS 6.3  --   HGB 10.8* 10.8*  HCT 34.2* 34.0*  MCV 79.4* 78.2*  PLT 362 336   Basic Metabolic Panel: Recent Labs  Lab 09/02/24 0240 09/03/24 0157  NA 127* 128*  K 5.3* 4.6  CL 95* 97*  CO2 22 20*  GLUCOSE 85 183*  BUN 20 12  CREATININE 1.64* 1.21  CALCIUM  9.1 8.6*   GFR: Estimated Creatinine Clearance: 38.6 mL/min (by C-G formula based on SCr of 1.21 mg/dL). Liver Function Tests: Recent Labs  Lab 09/02/24 0240  AST 19  ALT 11  ALKPHOS 68  BILITOT 1.0  PROT 6.8  ALBUMIN  3.1*   No results for input(s): LIPASE, AMYLASE in the last 168 hours. No results for input(s): AMMONIA in the last 168 hours. Coagulation Profile: Recent Labs  Lab 09/02/24 0240  INR 1.1   Cardiac Enzymes: No results for input(s): CKTOTAL, CKMB, CKMBINDEX, TROPONINI in the last 168 hours. BNP (last 3 results) No results for input(s): PROBNP in the last 8760 hours. HbA1C: No results for input(s): HGBA1C in the last 72 hours. CBG: Recent Labs  Lab 09/02/24 1050 09/02/24 1200 09/02/24 1433 09/02/24 1721 09/02/24 2049  GLUCAP 122* 103* 95 148* 155*   Lipid Profile: No results for input(s): CHOL, HDL, LDLCALC, TRIG, CHOLHDL, LDLDIRECT in the last 72 hours. Thyroid  Function Tests: No results for input(s): TSH, T4TOTAL, FREET4, T3FREE, THYROIDAB in the last 72 hours. Anemia Panel: No results for input(s): VITAMINB12, FOLATE, FERRITIN, TIBC, IRON, RETICCTPCT in the last 72 hours. Sepsis Labs: Recent Labs  Lab  09/02/24 0257 09/02/24 0300  PROCALCITON <0.10  --   LATICACIDVEN  --  1.1    Recent Results (from the past 240 hours)  Blood Culture (routine x 2)     Status: None (Preliminary result)   Collection Time: 09/02/24  2:40 AM   Specimen: BLOOD RIGHT ARM  Result Value Ref Range Status   Specimen Description BLOOD RIGHT ARM  Final   Special Requests   Final  BOTTLES DRAWN AEROBIC AND ANAEROBIC Blood Culture adequate volume   Culture   Final    NO GROWTH 1 DAY Performed at [facility], [address], [place], [place] [address]    Report Status PENDING  Incomplete  Blood Culture (routine x 2)     Status: None (Preliminary result)   Collection Time: 09/02/24  4:04 AM   Specimen: BLOOD LEFT ARM  Result Value Ref Range Status   Specimen Description BLOOD LEFT ARM  Final   Special Requests   Final    BOTTLES DRAWN AEROBIC AND ANAEROBIC Blood Culture adequate volume   Culture   Final    NO GROWTH 1 DAY Performed at [facility], [address], [place], [place] [address]    Report Status PENDING  Incomplete     Radiology Studies: CT ABDOMEN PELVIS WO CONTRAST Result Date: 09/02/2024 EXAM: CT ABDOMEN AND PELVIS WITHOUT CONTRAST 09/02/2024 05:55:10 AM TECHNIQUE: CT of the abdomen and pelvis was performed without the administration of intravenous contrast. Multiplanar reformatted images are provided for review. Automated exposure control, iterative reconstruction, and/or weight-based adjustment of the mA/kV was utilized to reduce the radiation dose to as low as reasonably achievable. COMPARISON: CT of the abdomen and pelvis dated 07/21/2024. CLINICAL HISTORY: Abdominal pain, acute, nonlocalized. Patient has been vomiting since 5pm. FINDINGS: LOWER CHEST: Patchy, streaky and ground-glass opacities present within the lung bases bilaterally. There is a mild-to-moderate right pleural effusion and a mild left effusion. LIVER: Normal size and contour. GALLBLADDER AND BILE DUCTS: There  are 2 stones lying dependently within the gallbladder. There is no evidence of cholecystitis. SPLEEN: Normal size. No focal lesion. PANCREAS: No mass. No ductal dilatation. ADRENAL GLANDS: Normal appearance. No mass. KIDNEYS, URETERS AND BLADDER: There is a prominent cyst arising laterally from the right kidney, measuring approximately 11 x 6.5 x 9.0 cm. Per consensus, no follow-up is needed for simple Bosniak type 1 and 2 renal cysts, unless the patient has a malignancy history or risk factors. No stones in the kidneys or ureters. No hydronephrosis. No perinephric or periureteral stranding. Urinary bladder is unremarkable. GI AND BOWEL: Stomach demonstrates no acute abnormality. There is no bowel obstruction. No bowel wall thickening. PERITONEUM AND RETROPERITONEUM: No ascites. No free air. VASCULATURE: The abdominal aorta demonstrates moderate calcific atheromatous disease. LYMPH NODES: No lymphadenopathy. REPRODUCTIVE ORGANS: There are prostate seed implants present. BONES AND SOFT TISSUES: No acute osseous abnormality. There is a fat-containing periumbilical hernia. IMPRESSION: 1. No acute findings in the abdomen or pelvis related to the clinical history of abdominal pain and vomiting. 2. Mild-to-moderate right pleural effusion and mild left effusion. Electronically signed by: Evalene Coho MD 09/02/2024 06:26 AM EDT RP Workstation: [id number]   DG Chest Port 1 View Result Date: 09/02/2024 EXAM: 1 VIEW XRAY OF THE CHEST 09/02/2024 02:53:00 AM COMPARISON: 07/21/2024 CLINICAL HISTORY: Questionable sepsis - evaluate for abnormality. FINDINGS: LUNGS AND PLEURA: Low lung volumes. Patchy bilateral airspace disease. Mild pulmonary edema. No pleural effusion. No pneumothorax. HEART AND MEDIASTINUM: Cardiomegaly. Aortic atherosclerotic calcification. BONES AND SOFT TISSUES: No acute osseous abnormality. IMPRESSION: 1. Patchy bilateral airspace disease and mild pulmonary edema 2. Cardiomegaly and aortic  atherosclerotic calcification. Electronically signed by: Franky Stanford MD 09/02/2024 03:23 AM EDT RP Workstation: [id number]   CT HEAD WO CONTRAST ( ) Result Date: 09/02/2024 EXAM: CT HEAD WITHOUT CONTRAST 09/02/2024 03:05:38 AM TECHNIQUE: CT of the head was performed without the administration of intravenous contrast. Automated exposure control, iterative reconstruction, and/or weight based  adjustment of the mA/kV was utilized to reduce the radiation dose to as low as reasonably achievable. COMPARISON: 07/22/2024 CLINICAL HISTORY: Mental status change, unknown cause. EMS reports pt has been vomiting since 5pm, EMS also reports per nursing facility staff pt has had increased confusion and reports confusion at baseline. Unknown how long increased confusion has been going on. FINDINGS: BRAIN AND VENTRICLES: No acute hemorrhage. No evidence of acute infarct. No hydrocephalus. No extra-axial collection. No mass effect or midline shift. Old right cerebellar infarct. Chronic ischemic white matter changes and generalized volume loss. ORBITS: No acute abnormality. SINUSES: No acute abnormality. SOFT TISSUES AND SKULL: No acute soft tissue abnormality. No skull fracture. 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 Chronic left mastoid effusion with areas of coalescence. IMPRESSION: 1. No acute intracranial abnormality. 2. 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 3. Old right cerebellar infarct. 4. Chronic ischemic white matter changes and generalized volume loss. Electronically signed by: Franky Stanford MD 09/02/2024 03:22 AM EDT RP Workstation: [id number]    Scheduled Meds:  amLODipine   10 mg Oral Daily   atorvastatin   40 mg Oral q AM   doxycycline   100 mg Oral Q12H   enoxaparin  (LOVENOX ) injection  30 mg Subcutaneous Q24H   guaiFENesin   600 mg Oral BID   pantoprazole   40 mg Oral Daily   sodium chloride  flush  3 mL  Intravenous Q12H   Continuous Infusions:  cefTRIAXone  (ROCEPHIN )  IV 2 g (09/02/24 1806)   dextrose  75 mL/hr at 09/03/24 0353     LOS: 0 days   Fredia Skeeter, MD [facility]  09/03/2024, 7:59 AM   *Please note that this is a verbal dictation therefore any spelling or grammatical errors are due to the Dragon Medical One system interpretation.  Please page via Amion and do not message via secure chat for urgent patient care matters. Secure chat can be used for non urgent patient care matters.  How to contact the [facility] Attending or Consulting provider 7A - 7P or covering provider during after hours 7P -7A, for this patient?  Check the care team in [facility] and look for a) attending/consulting [facility] provider listed and b) the [facility] team listed. Page or secure chat 7A-7P. Log into [email or web address] and use [place]'s universal password to access. If you do not have the password, please contact the hospital operator. Locate the [facility] provider you are looking for under [facility] and page to a number that you can be directly reached. If you still have difficulty reaching the provider, please page the [facility] (Director on Call) for the Hospitalists listed on amion for assistance.

## 2024-09-03 NOTE — Care Management Obs Status (Signed)
 MEDICARE OBSERVATION STATUS NOTIFICATION   Patient Details  Name: Andre Stout MRN: [id number] Date of Birth: Apr 11, 1940   Medicare Observation Status Notification Given:  Yes  Verbally reviewed observation notice with  Michaelle Idell Sayres telephonically at [phone].  Copy will be left in the patient room with his belongings      [name] 09/03/2024, 3:37 PM

## 2024-09-03 NOTE — Progress Notes (Signed)
 Transition of Care [facility]) - Inpatient Brief Assessment   Patient Details  Name: Andre Stout MRN: [id number] Date of Birth: 09-01-1940  Transition of Care [facility]) CM/SW Contact:    Rosaline JONELLE Joe, RN Phone Number: 09/03/2024, 10:25 AM   Clinical Narrative: Patient admitted from [facility] SNF with nausea, vomiting and AMS.  IP Care management team will continue to follow the patient for return to the facility when stable for discharge.   Transition of Care Asessment: Insurance and Status: (P) Insurance coverage has been reviewed Patient has primary care physician: (P) Yes Home environment has been reviewed: (P) from [facility] Prior level of function:: (P) assistance from facility Prior/Current Home Services: (P) Current home services Social Drivers of Health Review: (P) SDOH reviewed needs interventions Readmission risk has been reviewed: (P) Yes Transition of care needs: (P) transition of care needs identified, TOC will continue to follow

## 2024-09-04 ENCOUNTER — Encounter (HOSPITAL_COMMUNITY): Payer: Self-pay | Admitting: Internal Medicine

## 2024-09-04 DIAGNOSIS — R112 Nausea with vomiting, unspecified: Secondary | ICD-10-CM | POA: Diagnosis not present

## 2024-09-04 LAB — GLUCOSE, CAPILLARY
Glucose-Capillary: 131 mg/dL — ABNORMAL HIGH (ref 70–99)
Glucose-Capillary: 170 mg/dL — ABNORMAL HIGH (ref 70–99)
Glucose-Capillary: 206 mg/dL — ABNORMAL HIGH (ref 70–99)
Glucose-Capillary: 333 mg/dL — ABNORMAL HIGH (ref 70–99)
Glucose-Capillary: 270 mg/dL — ABNORMAL HIGH (ref 70–99)

## 2024-09-04 MED ORDER — INSULIN GLARGINE 100 UNIT/ML ~~LOC~~ SOLN
10.0000 [IU] | Freq: Every day | SUBCUTANEOUS | Status: DC
  Administered 2024-09-04 – 2024-09-07 (×4): 10 [IU] via SUBCUTANEOUS
  Filled 2024-09-04 (×5): qty 0.1

## 2024-09-04 MED ORDER — QUETIAPINE FUMARATE 25 MG PO TABS
25.0000 mg | ORAL_TABLET | Freq: Every evening | ORAL | Status: DC | PRN
  Filled 2024-09-04: qty 1

## 2024-09-04 NOTE — NC FL2 (Signed)
 [place]  MEDICAID FL2 LEVEL OF CARE FORM     IDENTIFICATION  Patient Name: Andre Stout Birthdate: [birth year] Sex: male Admission Date (Current Location): 09/02/2024  [facility] and [place] Number:  Producer, television/film/video and Address:  [facility], [address], [place], [place] [address]      Provider Number: [id number]  Attending Physician Name and Address:  Mcarthur Pick, MD  Relative Name and Phone Number:  Dorlene Michaelle Amble (Daughter)  [phone] (    Current Level of Care: Hospital Recommended Level of Care: Skilled Nursing Facility Prior Approval Number:    Date Approved/Denied:   PASRR Number: [id number]  Discharge Plan: SNF    Current Diagnoses: Patient Active Problem List   Diagnosis Date Noted   Pneumonia 09/02/2024   Nausea and vomiting 09/02/2024   Uncontrolled diabetes mellitus with hypoglycemia (HCC) 09/02/2024   Acute metabolic encephalopathy 09/02/2024   Hyperkalemia 09/02/2024   Microcytic anemia 09/02/2024   Moderate malnutrition (HCC) 07/25/2024   L1 vertebral fracture (HCC) 07/22/2024   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   Severe sepsis (HCC) 07/21/2024   Aspiration pneumonia (HCC) 07/21/2024   Acute respiratory failure with hypoxia (HCC) 07/21/2024   CKD (chronic kidney disease) stage 3, GFR 30-59 ml/min (HCC) 07/21/2024   Subdural hematoma (HCC) 07/21/2024   Irritant contact dermatitis associated with stoma 03/13/2024   Persistent postoperative fistula 03/13/2024   GERD without esophagitis 03/05/2024   Uncontrolled type 2 diabetes mellitus with hyperglycemia, with long-term current use of insulin  (HCC) 03/05/2024   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   History of complete heart block 03/05/2024   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   Malnutrition of moderate degree 01/20/2024   Hyperlipidemia 01/17/2024   Leukocytosis 01/17/2024   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  (HCC) 01/17/2024   Acute encephalopathy 01/17/2024   fracture of distal diaphyseal metaphyseal junction of the 5th left metacarpal with dorsal angulation 01/17/2024   Cholecystostomy care (HCC) 01/17/2024   Migration of percutaneous cholecystostomy tube 08/18/2023   Hypertension associated with diabetes (HCC) 08/18/2023   History of stroke 08/18/2023   Mixed diabetic hyperlipidemia associated with type 2 diabetes mellitus (HCC) 08/18/2023   TIA (transient ischemic attack) 10/15/2022   Odynophagia 08/21/2022   Hyponatremia 08/20/2022   Obesity (BMI 30-39.9) 08/20/2022   Myocardial injury 08/20/2022   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   Seizure (HCC) 01/02/2022   Prolonged QT interval 01/02/2022   Syncope and collapse 10/09/2020   Prostate cancer (HCC) 03/25/2019   Hypophosphatemia 03/06/2016   Benign essential HTN 03/05/2016   AKI (acute kidney injury) (HCC) 03/04/2016    Orientation RESPIRATION BLADDER Height & Weight     Self  Normal Incontinent Weight: 160 lb 0.9 oz (72.6 kg) (Wt from 07/21/2024) Height:  5' (152.4 cm)  BEHAVIORAL SYMPTOMS/MOOD NEUROLOGICAL BOWEL NUTRITION STATUS      Incontinent Diet (see DC summary)  AMBULATORY STATUS COMMUNICATION OF NEEDS Skin   Extensive Assist Verbally Normal                       Personal Care Assistance Level of Assistance  Bathing, Feeding, Dressing Bathing Assistance: Maximum assistance Feeding assistance: Limited assistance Dressing Assistance: Maximum assistance     Functional Limitations Info  Sight, Hearing, Speech Sight Info: Adequate Hearing Info: Adequate Speech Info: Adequate    SPECIAL CARE FACTORS FREQUENCY  PT (By licensed PT), OT (By licensed OT)     PT Frequency: 5x/week OT  Frequency: 5x/week            Contractures Contractures Info: Not present    Additional Factors Info  Code Status, Allergies Code Status Info: DNR Allergies Info: No Known Allergies           Current  Medications (09/04/2024):  This is the current hospital active medication list Current Facility-Administered Medications  Medication Dose Route Frequency Provider Last Rate Last Admin   acetaminophen  (TYLENOL ) tablet 650 mg  650 mg Oral Q6H PRN Smith, Rondell A, MD       Or   acetaminophen  (TYLENOL ) suppository 650 mg  650 mg Rectal Q6H PRN Claudene Reeves A, MD       albuterol  (PROVENTIL ) (2.5 MG/3ML) 0.083% nebulizer solution 2.5 mg  2.5 mg Nebulization Q2H PRN Smith, Rondell A, MD       amLODipine  (NORVASC ) tablet 10 mg  10 mg Oral Daily Smith, Rondell A, MD   10 mg at 09/03/24 0904   atorvastatin  (LIPITOR) tablet 40 mg  40 mg Oral q AM Smith, Rondell A, MD   40 mg at 09/04/24 1004   cefTRIAXone  (ROCEPHIN ) 2 g in sodium chloride  0.9 % 100 mL IVPB  2 g Intravenous Q24H Smith, Rondell A, MD 200 mL/hr at 09/03/24 1822 2 g at 09/03/24 1822   dextrose  50 % solution 50 mL  1 ampule Intravenous PRN Claudene Reeves A, MD   50 mL at 09/02/24 9072   doxycycline  (VIBRA -TABS) tablet 100 mg  100 mg Oral Q12H Smith, Rondell A, MD   100 mg at 09/04/24 1004   enoxaparin  (LOVENOX ) injection 40 mg  40 mg Subcutaneous Q24H Pahwani, Ravi, MD   40 mg at 09/04/24 1005   guaiFENesin  (MUCINEX ) 12 hr tablet 600 mg  600 mg Oral BID Smith, Rondell A, MD   600 mg at 09/04/24 1003   insulin  aspart (novoLOG ) injection 0-6 Units  0-6 Units Subcutaneous Q4H Pahwani, Ravi, MD   4 Units at 09/04/24 1642   pantoprazole  (PROTONIX ) EC tablet 40 mg  40 mg Oral Daily Smith, Rondell A, MD   40 mg at 09/04/24 1004   QUEtiapine  (SEROQUEL ) tablet 25 mg  25 mg Oral QHS PRN Sigdel, Santosh, MD       sodium chloride  flush (NS) 0.9 % injection 3 mL  3 mL Intravenous Q12H Smith, Rondell A, MD   3 mL at 09/04/24 1006     Discharge Medications: Please see discharge summary for a list of discharge medications.  Relevant Imaging Results:  Relevant Lab Results:   Additional Information [id number]  Armanii Pressnell A [place], LCSW

## 2024-09-04 NOTE — Progress Notes (Signed)
 Speech Language Pathology Treatment:    Patient Details Name: Andre Stout MRN: [id number] DOB: [birth year] Today's Date: 09/04/2024 Time: 8961-8893 SLP Time Calculation (min) (ACUTE ONLY): 28 min  Assessment / Plan / Recommendation Clinical Impression  SLP followed up at bedside for diet tolerance assessment and to clinically assess pt with nectar-thick liquids and thin liquids via 5cc Provale cup. Per RN, no overt concerns for aspiration with PO intake since starting diet yesterday.   Pt accepted sips of nectar-thick juice by straw and consumed all 4oz without overt or subtle symptoms of aspiration. He was able to use the Provale cup independently once soft mitts were removed. He did need verbal encouragement to drink fluids despite fulid type or viscosity.   SLP called pt's daughter and explained both options of thin via Provale cup vs nectar-thick liquids with option for larger sips. Explained that pt is at an ongoing risk of aspiration due to residue observed with both consistencies on MBSS. Pt is also at an elevated risk of dehydration with use of Provale cup (smaller volume) or nectar-thick liquids (reduced palatability for some people). Daughter expressed preference to try thin liquids via 5cc Provale cup. We discussed encouragement of water-rich fruits and vegetables to aid hydration efforts. Good understanding verbalized. Daughter also shared that his aspiration pneumonia appears to be linked to vomiting episodes. We discussed that some individuals can tolerate small amounts of aspiration of liquids without ever getting aspiration pneumonia, though we cannot confirm this is the case for Andre Stout.   Diet modified to thin liquids with 5cc Provale cup. Continue mech ground solids and crushed meds in applesauce. SLP will follow up to monitor tolerance.    HPI HPI: Andre Stout is an 84 yo male presenting to ED from [facility] 9/3 with AMS and vomiting. CT shows patchy, streaky and ground-glass  opacities within the bilateral lung bases. Seen most recently by SLP July 2025 (admitted with bacteremia and sepsis) with AMS resulting in cognitive dysphagia with biggest risk fo aspiration being post prandial given poor positioning, signs of GER, and recent history of vomiting.  PMH includes CHB s/p PPM, cholecystitis with chronic percutaneous cholecystostomy tube, prostate cancer, CKD 3A, prior CVA, HTN, T2DM      SLP Plan  Continue with current plan of care          Recommendations  Diet recommendations: Dysphagia 2 (fine chop);Thin liquid (via 5cc Provale cup) Medication Administration: Crushed with puree Supervision: Full supervision/cueing for compensatory strategies Compensations: Minimize environmental distractions;Slow rate;Small sips/bites Postural Changes and/or Swallow Maneuvers: Seated upright 90 degrees;Upright 30-60 min after meal                  Oral care QID   Frequent or constant Supervision/Assistance Dysphagia, oropharyngeal phase (R13.12)     Continue with current plan of care     Andre Stout  09/04/2024, 11:23 AM

## 2024-09-04 NOTE — Evaluation (Signed)
 Occupational Therapy Evaluation Patient Details Name: Andre Stout MRN: [id number] DOB: [birth year] Today's Date: 09/04/2024   History of Present Illness   Pt  is a 84 y.o. male presenting on 9/3 with emesis and AMS. Noted hospitalized from 7/22-31 with sepsis with concern for CAP. Chest x-ray noted patchy bilateral airspace disease with mild pulmonary edema, CT head negative for acute process, CT abdomen with R pleural effusion.  PMH: AV block, hypertension, type 2 diabetes, prostate cancer, glaucoma, HTNm, Cholecystitis s/p cholecystostomy in 07/2022.     Clinical Impressions Prior to this admission, patient residing at [facility] (per chart) with patient unable to provide PT or OT any details of transfers, mobility, or ADLs. Patient supervision for bed mobility, then min A of 2 to complete sit<>stand with hand held assist. Patient able to take a few steps with min A of 2, however unable to take steps backwards, requiring max A of 2 to return to the bed. It is assummed that this patient was completing stand pivots to a wheelchair however patient unable to state despite frequent attempts. OT recommending return to [facility] when medically stable; OT will continue to follow.      If plan is discharge home, recommend the following:   Two people to help with walking and/or transfers;A lot of help with bathing/dressing/bathroom;Assistance with cooking/housework;Direct supervision/assist for medications management;Direct supervision/assist for financial management;Assist for transportation;Help with stairs or ramp for entrance;Supervision due to cognitive status     Functional Status Assessment   Patient has had a recent decline in their functional status and demonstrates the ability to make significant improvements in function in a reasonable and predictable amount of time.     Equipment Recommendations   Other (comment) (defer to next venue)     Recommendations for Other Services          Precautions/Restrictions   Precautions Precautions: Fall Recall of Precautions/Restrictions: Impaired Restrictions Weight Bearing Restrictions Per Provider Order: No     Mobility Bed Mobility Overal bed mobility: Needs Assistance Bed Mobility: Supine to Sit, Sit to Supine     Supine to sit: Supervision Sit to supine: Supervision   General bed mobility comments: minimal increased time    Transfers Overall transfer level: Needs assistance Equipment used: 2 person hand held assist Transfers: Sit to/from Stand Sit to Stand: Min assist, +2 physical assistance, Max assist           General transfer comment: Min A of 2 to come into standing, and to complete steps forward, patient with scissored gait with a few steps forward, OT and PT attempting to have patient step backwards, with need for max A of 2 to remain upright and pulling the bed behind the patient due to significant posterior lean      Balance Overall balance assessment: Needs assistance Sitting-balance support: Bilateral upper extremity supported, Feet supported Sitting balance-Leahy Scale: Fair     Standing balance support: During functional activity, Reliant on assistive device for balance, Bilateral upper extremity supported Standing balance-Leahy Scale: Poor Standing balance comment: significant posterior lean when attempting to go backwards                           ADL either performed or assessed with clinical judgement   ADL Overall ADL's : Needs assistance/impaired Eating/Feeding: Set up;Sitting   Grooming: Set up;Sitting   Upper Body Bathing: Contact guard assist;Sitting   Lower Body Bathing: Moderate assistance;Sit to/from stand;Sitting/lateral leans   Upper  Body Dressing : Contact guard assist;Sitting   Lower Body Dressing: Moderate assistance;Maximal assistance;Sitting/lateral leans;Sit to/from stand   Toilet Transfer: Moderate assistance;+2 for physical assistance;+2  for safety/equipment   Toileting- Clothing Manipulation and Hygiene: Moderate assistance;Sit to/from stand;Sitting/lateral lean       Functional mobility during ADLs: Moderate assistance;+2 for safety/equipment;+2 for physical assistance;Cueing for safety;Cueing for sequencing General ADL Comments: Prior to this admission, patient residing at [facility] (per chart) with patient unable to provide PT or OT any details of transfers, mobility, or ADLs. Patient supervision for bed mobility, then  min A of 2 to complete sit<>stand with hand held assist. Patient able to take a few steps with min A of 2, however unable to take steps backwards, requiring max A of 2 to return to the bed. It is assummed that this patient was completing stand pivots to a wheelchair however patient unable to state despite frequent attempts. OT recommending return to [facility] when medically stable; OT will continue to follow.     Vision Baseline Vision/History: 0 No visual deficits Ability to See in Adequate Light: 0 Adequate Patient Visual Report: No change from baseline Vision Assessment?: No apparent visual deficits     Perception Perception: Not tested       Praxis Praxis: Not tested       Pertinent Vitals/Pain Pain Assessment Pain Assessment: No/denies pain     Extremity/Trunk Assessment Upper Extremity Assessment Upper Extremity Assessment: Right hand dominant;Overall [facility] for tasks assessed   Lower Extremity Assessment Lower Extremity Assessment: Defer to PT evaluation   Cervical / Trunk Assessment Cervical / Trunk Assessment: Kyphotic   Communication Communication Communication: Impaired Factors Affecting Communication: Hearing impaired   Cognition Arousal: Alert Behavior During Therapy: Flat affect Cognition: Difficult to assess, Cognition impaired Difficult to assess due to: Hard of hearing/deaf Orientation impairments: Time, Situation Awareness: Intellectual awareness impaired, Online  awareness impaired Memory impairment (select all impairments): Short-term memory, Working [employer] fast streamer, Non-declarative long-term memory, Geneticist, molecular long-term memory Attention impairment (select first level of impairment): Focused attention Executive functioning impairment (select all impairments): Initiation, Organization, Sequencing, Reasoning, Problem solving OT - Cognition Comments: Significant HOH, unable to provide any insight into where he was residing (no recollection of [place]) or what he was doing at baseline                 Following commands: Impaired Following commands impaired: Follows one step commands with increased time, Follows one step commands inconsistently, Follows multi-step commands inconsistently     Cueing  General Comments   Cueing Techniques: Verbal cues;Gestural cues;Tactile cues;Visual cues  VSS   Exercises     Shoulder Instructions      Home Living Family/patient expects to be discharged to:: Skilled nursing facility                                 Additional Comments: Patient unable to provide any histort, assuming back to SNF      Prior Functioning/Environment Prior Level of Function : Patient poor historian/Family not available             Mobility Comments: unable to state what he was doing ADLs Comments: unable to state    OT Problem List: Decreased strength;Decreased activity tolerance;Impaired balance (sitting and/or standing);Decreased coordination;Decreased cognition;Decreased safety awareness;Decreased knowledge of use of DME or AE   OT Treatment/Interventions: Self-care/ADL training;Therapeutic exercise;DME and/or AE instruction;Energy conservation;Manual therapy;Therapeutic activities;Patient/family education;Balance training  OT Goals(Current goals can be found in the care plan section)   Acute Rehab OT Goals Patient Stated Goal: unable OT Goal Formulation: Patient unable to participate in goal  setting Time For Goal Achievement: 09/18/24 Potential to Achieve Goals: Fair   OT Frequency:  Min 2X/week    Co-evaluation   Reason for Co-Treatment: Complexity of the patient's impairments (multi-system involvement);Necessary to address cognition/behavior during functional activity;For patient/therapist safety;To address functional/ADL transfers PT goals addressed during session: Mobility/safety with mobility;Proper use of DME;Strengthening/ROM;Balance OT goals addressed during session: ADL's and self-care;Strengthening/ROM      AM-PAC OT 6 Clicks Daily Activity     Outcome Measure Help from another person eating meals?: A Little Help from another person taking care of personal grooming?: A Little Help from another person toileting, which includes using toliet, bedpan, or urinal?: A Lot Help from another person bathing (including washing, rinsing, drying)?: A Lot Help from another person to put on and taking off regular upper body clothing?: A Little Help from another person to put on and taking off regular lower body clothing?: A Lot 6 Click Score: 15   End of Session Equipment Utilized During Treatment: Gait belt Nurse Communication: Mobility status  Activity Tolerance: Patient tolerated treatment well Patient left: in bed;with call bell/phone within reach;with bed alarm set  OT Visit Diagnosis: Unsteadiness on feet (R26.81);Muscle weakness (generalized) (M62.81);Other symptoms and signs involving cognitive function;Other abnormalities of gait and mobility (R26.89)                Time: 8897-8881 OT Time Calculation (min): 16 min Charges:  OT General Charges $OT Visit: 1 Visit OT Evaluation $OT Eval Moderate Complexity: 1 Mod  Ronal Gift E. Katyra Tomassetti, OTR/L Acute Rehabilitation Services [phone]   Ronal Gift Salt 09/04/2024, 12:47 PM

## 2024-09-04 NOTE — Evaluation (Signed)
 Physical Therapy Evaluation Patient Details Name: Andre Stout MRN: [id number] DOB: 1940/05/17 Today's Date: 09/04/2024  History of Present Illness  Pt  is a 84 y.o. male presenting on 9/3 with emesis and AMS. Noted hospitalized from 7/22-31 with sepsis with concern for CAP. Chest x-ray noted patchy bilateral airspace disease with mild pulmonary edema, CT head negative for acute process, CT abdomen with R pleural effusion.  PMH: AV block, hypertension, type 2 diabetes, prostate cancer, glaucoma, HTNm, Cholecystitis s/p cholecystostomy in 07/2022.   Clinical Impression  Pt presents with condition above and deficits mentioned below, see PT Problem List. The pt is a poor historian and no family is present to confirm PLOF. Per chart, he is from a SNF though. Currently, he displays deficits in cognition, balance, activity tolerance, power, and strength that place him at high risk for falls. When trying to ambulate, he displayed a scissoring gait pattern, initially requiring minAx2 to transfer to stand and take forward steps then needing maxAx2 as he began to strongly lean posteriorly when cued to step posteriorly. Will continue to follow acutely and recommend pt return to a SNF for further rehab.         If plan is discharge home, recommend the following: Two people to help with walking and/or transfers;A lot of help with bathing/dressing/bathroom;Assistance with cooking/housework;Direct supervision/assist for medications management;Direct supervision/assist for financial management;Assist for transportation;Assistance with feeding;Help with stairs or ramp for entrance   Can travel by private vehicle   No    Equipment Recommendations Other (comment) (defer to next venue of care)  Recommendations for Other Services       Functional Status Assessment Patient has had a recent decline in their functional status and demonstrates the ability to make significant improvements in function in a reasonable  and predictable amount of time.     Precautions / Restrictions Precautions Precautions: Fall Recall of Precautions/Restrictions: Impaired Precaution/Restrictions Comments: bil mittens Restrictions Weight Bearing Restrictions Per Provider Order: No      Mobility  Bed Mobility Overal bed mobility: Needs Assistance Bed Mobility: Supine to Sit, Sit to Supine     Supine to sit: Supervision, HOB elevated, Used rails Sit to supine: Supervision, HOB elevated, Used rails   General bed mobility comments: minimal increased time, pt using bed rail with HOB elevated, supervision for safety    Transfers Overall transfer level: Needs assistance Equipment used: 2 person hand held assist Transfers: Sit to/from Stand Sit to Stand: Min assist, +2 physical assistance, Max assist           General transfer comment: Min A of 2 to come into standing, and to complete steps forward, patient with scissored gait with a few steps forward, OT and PT attempting to have patient step backwards, with need for max A of 2 to remain upright and pulling the bed behind the patient due to significant posterior lean    Ambulation/Gait Ambulation/Gait assistance: Min assist, Max assist, +2 safety/equipment, +2 physical assistance Gait Distance (Feet): 3 Feet Assistive device: 2 person hand held assist Gait Pattern/deviations: Decreased step length - right, Decreased step length - left, Decreased stride length, Scissoring, Narrow base of support, Trunk flexed, Leaning posteriorly Gait velocity: reduced Gait velocity interpretation: <1.31 ft/sec, indicative of household ambulator   General Gait Details: Pt taking steps anteriorly with bil HHA and flexed posture. Pt taking small, narrow steps, progressively scissoring his steps. Initially he required minAx2 for balance but as distance progressed and when attempting stepping posteriorly, pt began to  lean posteriorly heavily and need maxAx2 to maintain his balance  and pull the bed up behind him to sit to ensure his safety.  Stairs            Wheelchair Mobility     Tilt Bed    Modified Rankin (Stroke Patients Only) Modified Rankin (Stroke Patients Only) Pre-Morbid Rankin Score: Moderately severe disability Modified Rankin: Moderately severe disability     Balance Overall balance assessment: Needs assistance Sitting-balance support: Bilateral upper extremity supported, Feet supported Sitting balance-Leahy Scale: Fair Sitting balance - Comments: static sitting EOB with supervision for safety Postural control: Posterior lean Standing balance support: During functional activity, Reliant on assistive device for balance, Bilateral upper extremity supported Standing balance-Leahy Scale: Poor Standing balance comment: significant posterior lean when attempting to step backwards, needing minAx2 initially then maxAx2 to balance and sit him back on  EOB safely                             Pertinent Vitals/Pain Pain Assessment Pain Assessment: No/denies pain    Home Living Family/patient expects to be discharged to:: Skilled nursing facility                   Additional Comments: Patient unable to provide any history, assuming back to SNF    Prior Function Prior Level of Function : Patient poor historian/Family not available             Mobility Comments: unable to state what he was doing ADLs Comments: unable to state     Extremity/Trunk Assessment   Upper Extremity Assessment Upper Extremity Assessment: Defer to OT evaluation    Lower Extremity Assessment Lower Extremity Assessment: Generalized weakness;RLE deficits/detail;LLE deficits/detail RLE Deficits / Details: MMT score of 3- in quads LLE Deficits / Details: MMT score of 3- in quads    Cervical / Trunk Assessment Cervical / Trunk Assessment: Kyphotic  Communication   Communication Communication: Impaired Factors Affecting Communication:  Hearing impaired    Cognition Arousal: Alert Behavior During Therapy: Flat affect   PT - Cognitive impairments: History of cognitive impairments                       PT - Cognition Comments: Per chart and prior therapy notes from recent hospitalizations, pt has had AMS. Currently, pt is unreliable of his report of PLOF and home info. He is slow to process and respond to cues, possibly being impacted by being [facility]. Poor awareness of his deficits and safety also Following commands: Impaired Following commands impaired: Follows one step commands with increased time, Follows one step commands inconsistently, Follows multi-step commands inconsistently     Cueing Cueing Techniques: Verbal cues, Gestural cues, Tactile cues, Visual cues     General Comments General comments (skin integrity, edema, etc.): VSS    Exercises     Assessment/Plan    PT Assessment Patient needs continued PT services  PT Problem List Decreased strength;Decreased activity tolerance;Decreased balance;Decreased mobility;Decreased cognition;Decreased knowledge of use of DME;Decreased safety awareness       PT Treatment Interventions DME instruction;Gait training;Functional mobility training;Therapeutic activities;Therapeutic exercise;Balance training;Neuromuscular re-education;Cognitive remediation;Patient/family education;Wheelchair mobility training    PT Goals (Current goals can be found in the Care Plan section)  Acute Rehab PT Goals Patient Stated Goal: did not state PT Goal Formulation: With patient Time For Goal Achievement: 09/18/24 Potential to Achieve Goals: Fair    Frequency Min 2X/week  Co-evaluation   Reason for Co-Treatment: Complexity of the patient's impairments (multi-system involvement);Necessary to address cognition/behavior during functional activity;For patient/therapist safety;To address functional/ADL transfers PT goals addressed during session: Mobility/safety with  mobility;Proper use of DME;Strengthening/ROM;Balance OT goals addressed during session: ADL's and self-care;Strengthening/ROM       AM-PAC PT 6 Clicks Mobility  Outcome Measure Help needed turning from your back to your side while in a flat bed without using bedrails?: A Little Help needed moving from lying on your back to sitting on the side of a flat bed without using bedrails?: A Little Help needed moving to and from a bed to a chair (including a wheelchair)?: Total Help needed standing up from a chair using your arms (e.g., wheelchair or bedside chair)?: Total Help needed to walk in hospital room?: Total Help needed climbing 3-5 steps with a railing? : Total 6 Click Score: 10    End of Session Equipment Utilized During Treatment: Gait belt Activity Tolerance: Patient tolerated treatment well Patient left: in bed;with call bell/phone within reach;with bed alarm set;with restraints reapplied   PT Visit Diagnosis: Unsteadiness on feet (R26.81);Other abnormalities of gait and mobility (R26.89);Muscle weakness (generalized) (M62.81);Difficulty in walking, not elsewhere classified (R26.2)    Time: 8893-8880 PT Time Calculation (min) (ACUTE ONLY): 13 min   Charges:   PT Evaluation $PT Eval Moderate Complexity: 1 Mod   PT General Charges $$ ACUTE PT VISIT: 1 Visit         Andre Stout, PT, DPT Acute Rehabilitation Services  Office: [phone]   Andre Stout 09/04/2024, 1:11 PM

## 2024-09-04 NOTE — Progress Notes (Addendum)
 PROGRESS NOTE    Andre Stout  FMW:[id number] DOB: May 15, 1940 DOA: 09/02/2024 PCP: Andre Leita Repine, FNP   Brief Narrative:   Andre Stout is a 83 y.o. male with medical history significant of CHB s/ppacemaker, HTN, DM2, prior CVA, cholecystitis with chronic percutaneous cholecystostomy tube, prostate cancer, CKD 3A presents with nausea and vomiting and confusion.   Found to have hyponatremia 127.  Also chest x-ray noted patchy bilateral airspace disease with mild pulmonary edema and cardiomegaly.  Assessment & Plan:   Principal Problem:   Nausea and vomiting Active Problems:   Aspiration pneumonia (HCC)   Pneumonia   Uncontrolled diabetes mellitus with hypoglycemia (HCC)   Acute metabolic encephalopathy   AKI (acute kidney injury) (HCC)   Hypertension associated with diabetes (HCC)   Hyperkalemia   Hyponatremia   Microcytic anemia   Nausea and vomiting Source unclear.  Abdominal pelvis CT scan is negative for any acute abnormalities.  Continue symptomatic management.  Aspiration pneumonia Patient noted to have patchy bilateral airspace opacities on chest x-ray.  Thought likely secondary to episodes of nausea and vomiting prior to arrival at the nursing facility.  Aspiration precautions.  Continue Rocephin  and doxycycline .   SLP on board-dietary recommendation, dysphagia diet 2, thin liquid. - Aspiration precautions with elevation head of bed   Acute metabolic encephalopathy: In the setting of dementia, patient appears to be at baseline. Will utilize Seroquel  nightly as needed for sleep/agitation.  Hyponatremia: Sodium stable at 128.  Received IV fluid earlier.  Due to concern for elevated BNP, CHF/edema patient had continued.  Will recheck sodium in the a.m.  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  However patient not hypoxic and not symptomatic.  Will watch for now but avoid IV fluids.   Uncontrolled diabetes mellitus type 2 with hypoglycemia  Blood sugars noted to be as low as 41 on admission, now rising levels.  Utilize sliding scale insulin  coverage.  Add lantus  10u at bedtime. Is on 15 u bid at home   Acute kidney injury Patient presents with creatinine elevated up to 1.64 with BUN 28.  Baseline creatinine previously noted to be around 1.  Now resolved.   Essential hypertension Blood pressures fairly controlled, continue amlodipine .   Hyperlipidemia: Continue atorvastatin .   Hyperkalemia Resolved.   Microcytic hypochromic anemia Hemoglobin stable.  Extra-axial mass Incidental finding noted on CT unchanged from the CT scan compared from 07/22/2024.     DVT prophylaxis: enoxaparin  (LOVENOX ) injection 30 mg Start: 09/02/24 0900   Code Status: Limited: Do not attempt resuscitation (DNR) -DNR-LIMITED -Do Not Intubate/DNI   Family Communication:  None present at bedside.     Status is: Observation The patient will require care spanning > 2 midnights and should be moved to inpatient because: Patient n.p.o., needs to be evaluated by SLP.     Estimated body mass index is 31.26 kg/m as calculated from the following:   Height as of this encounter: 5' (1.524 m).   Weight as of this encounter: 72.6 kg.   Nutritional Assessment: Body mass index is 31.26 kg/m.[name] Seen by dietician.  I agree with the assessment and plan as outlined below: Nutrition Status:   . Skin Assessment: I have examined the patient's skin and I agree with the wound assessment as performed by the wound care RN as outlined below:   Consultants:  None   Procedures:  None   Antimicrobials:  Anti-infectives (From admission, onward)        Start     Dose/Rate Route Frequency Ordered Stop    09/02/24 2200   doxycycline  (VIBRA -TABS) tablet 100 mg        100 mg Oral Every 12  hours 09/02/24 1740      09/02/24 1830   cefTRIAXone  (ROCEPHIN ) 2 g in sodium chloride  0.9 % 100 mL IVPB        2 g 200 mL/hr over 30 Minutes Intravenous Every 24 hours 09/02/24 1734      09/02/24 0345   ceFEPIme  (MAXIPIME ) 2 g in sodium chloride  0.9 % 100 mL IVPB        2 g 200 mL/hr over 30 Minutes Intravenous  Once 09/02/24 0333 09/02/24 0443    09/02/24 0345   metroNIDAZOLE  (FLAGYL ) IVPB 500 mg        500 mg 100 mL/hr over 60 Minutes Intravenous  Once 09/02/24 0333 09/02/24 0540    09/02/24 0345   vancomycin  (VANCOCIN ) IVPB 1000 mg/200 mL premix  Status:  Discontinued        1,000 mg 200 mL/hr over 60 Minutes Intravenous  Once 09/02/24 0333 09/02/24 0336    09/02/24 0345   vancomycin  (VANCOREADY) IVPB 1750 mg/350 mL        1,750 mg 175 mL/hr over 120 Minutes Intravenous  Once 09/02/24 0336 09/02/24 1057             Subjective: Patient seen and examined, patient had hand mittens.  He was alert and oriented to self.  No complaints.  Appeared comfortable.   DVT prophylaxis:  Code Status:   Family Communication:  Disposition Plan: Status is: Inpatient    Objective: Vitals:   09/04/24 0508 09/04/24 0800 09/04/24 1241 09/04/24 1555  BP: (!) 136/54 (!) 102/52 (!) 132/58 (!) 118/52  Pulse: (!) 52 (!) 52 (!) 54 (!) 57  Resp: 19 16 16 15   Temp: 97.7 F (36.5 C) 97.7 F (36.5 C) 98 F (36.7 C) 97.7 F (36.5 C)  TempSrc: Oral Axillary    SpO2: 100%  100% 100%  Weight:      Height:        Intake/Output Summary (Last 24 hours) at 09/04/2024 1558 Last data filed at 09/03/2024 2330 Gross per 24 hour  Intake 3 ml  Output 700 ml  Net -697 ml   Filed Weights   09/02/24 0327  Weight: 72.6 kg    Examination:  General exam: Appears calm and comfortable  Respiratory system: Bilateral decreased breath sounds at bases Cardiovascular system: S1 & S2 heard, Rate controlled Gastrointestinal system: Abdomen is nondistended, soft and nontender. Normal bowel sounds  heard. Extremities: No cyanosis, clubbing, edema  Central nervous system: Alert and oriented. No focal neurological deficits. Moving extremities Skin: No rashes, lesions or ulcers Psychiatry: Judgement and insight appear normal. Mood & affect appropriate.     Data Reviewed: I have personally reviewed following labs and imaging studies  CBC: Recent Labs  Lab 09/02/24 0240 09/03/24 0157  WBC 9.2 6.9  NEUTROABS 6.3  --   HGB 10.8* 10.8*  HCT 34.2* 34.0*  MCV 79.4* 78.2*  PLT 362 336   Basic Metabolic Panel: Recent Labs  Lab 09/02/24 0240 09/03/24 0157  NA 127* 128*  K 5.3* 4.6  CL 95* 97*  CO2 22 20*  GLUCOSE 85 183*  BUN 20 12  CREATININE 1.64* 1.21  CALCIUM  9.1 8.6*  GFR: Estimated Creatinine Clearance: 38.6 mL/min (by C-G formula based on SCr of 1.21 mg/dL). Liver Function Tests: Recent Labs  Lab 09/02/24 0240  AST 19  ALT 11  ALKPHOS 68  BILITOT 1.0  PROT 6.8  ALBUMIN  3.1*   No results for input(s): LIPASE, AMYLASE in the last 168 hours. No results for input(s): AMMONIA in the last 168 hours. Coagulation Profile: Recent Labs  Lab 09/02/24 0240  INR 1.1   Cardiac Enzymes: No results for input(s): CKTOTAL, CKMB, CKMBINDEX, TROPONINI in the last 168 hours. BNP (last 3 results) No results for input(s): PROBNP in the last 8760 hours. HbA1C: No results for input(s): HGBA1C in the last 72 hours. CBG: Recent Labs  Lab 09/03/24 2003 09/03/24 2324 09/04/24 0508 09/04/24 0954 09/04/24 1240  GLUCAP 139* 185* 131* 170* 206*   Lipid Profile: No results for input(s): CHOL, HDL, LDLCALC, TRIG, CHOLHDL, LDLDIRECT in the last 72 hours. Thyroid  Function Tests: No results for input(s): TSH, T4TOTAL, FREET4, T3FREE, THYROIDAB in the last 72 hours. Anemia Panel: No results for input(s): VITAMINB12, FOLATE, FERRITIN, TIBC, IRON, RETICCTPCT in the last 72 hours. Sepsis Labs: Recent Labs  Lab 09/02/24 0257  09/02/24 0300  PROCALCITON <0.10  --   LATICACIDVEN  --  1.1    Recent Results (from the past 240 hours)  Blood Culture (routine x 2)     Status: None (Preliminary result)   Collection Time: 09/02/24  2:40 AM   Specimen: BLOOD RIGHT ARM  Result Value Ref Range Status   Specimen Description BLOOD RIGHT ARM  Final   Special Requests   Final    BOTTLES DRAWN AEROBIC AND ANAEROBIC Blood Culture adequate volume   Culture   Final    NO GROWTH 2 DAYS Performed at [facility], [address] [place], [place] [address]    Report Status PENDING  Incomplete  Blood Culture (routine x 2)     Status: None (Preliminary result)   Collection Time: 09/02/24  4:04 AM   Specimen: BLOOD LEFT ARM  Result Value Ref Range Status   Specimen Description BLOOD LEFT ARM  Final   Special Requests   Final    BOTTLES DRAWN AEROBIC AND ANAEROBIC Blood Culture adequate volume   Culture   Final    NO GROWTH 2 DAYS Performed at [facility], [address], [place], [place] [address]    Report Status PENDING  Incomplete         Radiology Studies: DG Swallowing Func-Speech Pathology Result Date: 09/03/2024 Table formatting from the original result was not included. Modified Barium Swallow Study Patient Details Name: YANNICK STEUBER MRN: [id number] Date of Birth: April 15, 1940 Today's Date: 09/03/2024 HPI/PMH: HPI: AMMAR MOFFATT is an 84 yo male presenting to ED from [facility] 9[facility]3 with AMS and vomiting. CT shows patchy, streaky and ground-glass opacities within the bilateral lung bases. Seen most recently by SLP July 2025 (admitted with bacteremia and sepsis) with AMS resulting in cognitive dysphagia with biggest risk fo aspiration being post prandial given poor positioning, signs of GER, and recent history of vomiting.  PMH includes CHB s/p PPM, cholecystitis with chronic percutaneous cholecystostomy tube, prostate cancer, CKD 3A, prior CVA, HTN, T2DM Clinical Impression:  Pt presents with a moderate  oropharyngeal dysphagia per MBSS results today. There was delayed silent aspiration of thin liquid residue observed during the study. Pt was unable to follow directions for compensatory swallow strategies. No apparent aspiration of nectar-thick liquids, puree, or soft solid  observed. Oral deficits characterized by reduced oral strength and coordination resulting in pre-spill of majority of trials to the level of the pyriform sinuses before the swallow. Increased oral prep and transit time observed with soft solid. Pharyngeal deficits characterized by reduced base of tongue retraction, reduced laryngeal vestibule closure, and reduced pharyngeal stripping. Plan to start a mechanical altered/ground diet and nectar-thick liquids. Pt may be able to tolerate thin liquids with use of 5cc Provale cup since no penetration/aspiration was observed with spoon sips of thin liquids. Pt demonstrated oropharyngeal residue with both thin liquids and nectar-thick liquids and will be at an elevated risk of aspiration of residue of either consistency. Recommend follow up discussion with pt's family to talk about thin vs nectar-thick liquids and risks/benefits of thickening liquids if considering thickened liquids for a longer term recommendation. Attempted to call family today, though they were unavailable. SLP to follow up to assess diet tolerance and provide family education. Factors that may increase risk of adverse event in presence of aspiration [name] & [name] 2021): Factors that may increase risk of adverse event in presence of aspiration [name] & [name] 2021): Reduced cognitive function; Dependence for feeding and/or oral hygiene Recommendations/Plan: Swallowing Evaluation Recommendations Swallowing Evaluation Recommendations Recommendations: PO diet PO Diet Recommendation: Dysphagia 2 (Finely chopped); Mildly thick liquids (Level 2, nectar thick) Liquid Administration via: Cup; Straw Medication Administration: Crushed with  puree Supervision: Full assist for feeding; Full supervision/cueing for swallowing strategies Swallowing strategies  : Minimize environmental distractions; Slow rate; Small bites/sips Postural changes: Position pt fully upright for meals Oral care recommendations: Oral care QID (4x/day) Treatment Plan Treatment Plan Treatment recommendations: Therapy as outlined in treatment plan below Follow-up recommendations: Skilled nursing-short term rehab (<3 hours/day) Functional status assessment: Patient has had a recent decline in their functional status and demonstrates the ability to make significant improvements in function in a reasonable and predictable amount of time. Treatment frequency: Min 2x/week Treatment duration: 4 weeks Interventions: Aspiration precaution training; Trials of upgraded texture/liquids; Diet toleration management by SLP; Patient/family education; Compensatory techniques Recommendations Recommendations for follow up therapy are one component of a multi-disciplinary discharge planning process, led by the attending physician.  Recommendations may be updated based on patient status, additional functional criteria and insurance authorization. Assessment: Orofacial Exam: Orofacial Exam Oral Cavity: Oral Hygiene: WFL Oral Cavity - Dentition: Dentures, top; Dentures, bottom Anatomy: Anatomy: WFL Boluses Administered: Boluses Administered Boluses Administered: Thin liquids (Level 0); Mildly thick liquids (Level 2, nectar thick); Puree; Solid  Oral Impairment Domain: Oral Impairment Domain Lip Closure: No labial escape Tongue control during bolus hold: Posterior escape of less than half of bolus Bolus preparation/mastication: Slow prolonged chewing/mashing with complete recollection Bolus transport/lingual motion: Delayed initiation of tongue motion (oral holding) Oral residue: Trace residue lining oral structures Location of oral residue : Tongue Initiation of pharyngeal swallow : Pyriform sinuses   Pharyngeal Impairment Domain: Pharyngeal Impairment Domain Soft palate elevation: No bolus between soft palate (SP)/pharyngeal wall (PW) Laryngeal elevation: Complete superior movement of thyroid  cartilage with complete approximation of arytenoids to epiglottic petiole Anterior hyoid excursion: Complete anterior movement Epiglottic movement: Complete inversion Laryngeal vestibule closure: Incomplete, narrow column air/contrast in laryngeal vestibule Pharyngeal stripping wave : Present - diminished Pharyngoesophageal segment opening: Complete distension and complete duration, no obstruction of flow Tongue base retraction: Narrow column of contrast or air between tongue base and PPW Pharyngeal residue: Collection of residue within or on pharyngeal structures Location of pharyngeal residue: Tongue base; Valleculae; Pyriform sinuses  Esophageal Impairment  Domain: Esophageal Impairment Domain Esophageal clearance upright position: Complete clearance, esophageal coating Pill: No data recorded Penetration/Aspiration Scale Score: Penetration/Aspiration Scale Score 1.  Material does not enter airway: Mildly thick liquids (Level 2, nectar thick); Puree; Solid 2.  Material enters airway, remains ABOVE vocal cords then ejected out: Thin liquids (Level 0) 8.  Material enters airway, passes BELOW cords without attempt by patient to eject out (silent aspiration) : Thin liquids (Level 0) Compensatory Strategies: Compensatory Strategies Compensatory strategies: No (unable to follow commands for strategies)   General Information: Caregiver present: No (attempted to call, though no answer)  Diet Prior to this Study: NPO   Temperature : Normal   Respiratory Status: WFL   Supplemental O2: None (Room air)   History of Recent Intubation: No  Behavior/Cognition: Alert; Cooperative; Confused; Requires cueing; Distractible Self-Feeding Abilities: Dependent for feeding Baseline vocal quality/speech: Not observed Volitional Cough: Unable to  elicit Volitional Swallow: Able to elicit (max cues) Exam Limitations: Poor positioning Goal Planning: Prognosis for improved oropharyngeal function: Fair Barriers to Reach Goals: Cognitive deficits No data recorded Patient/Family Stated Goal: none stated Consulted and agree with results and recommendations: Patient; Nurse (family not available) Pain: Pain Assessment Pain Assessment: No/denies pain End of Session: Start Time:SLP Start Time (ACUTE ONLY): 1330 Stop Time: SLP Stop Time (ACUTE ONLY): 1355 Time Calculation:SLP Time Calculation (min) (ACUTE ONLY): 25 min Charges: SLP Evaluations $ SLP Speech Visit: 1 Visit SLP Evaluations $BSS Swallow: 1 Procedure $MBS Swallow: 1 Procedure SLP visit diagnosis: SLP Visit Diagnosis: Dysphagia, oropharyngeal phase (R13.12) Past Medical History: Past Medical History: Diagnosis Date  Diabetes mellitus without complication (HCC)   Glaucoma   Heart block   complete heart block  Hypertension   Prostate cancer (HCC)   Been 3-4 years ago Past Surgical History: Past Surgical History: Procedure Laterality Date  CATARACT EXTRACTION Bilateral   INSERTION PROSTATE RADIATION SEED    IR EXCHANGE BILIARY DRAIN  08/19/2023  IR EXCHANGE BILIARY DRAIN  12/19/2023  IR EXCHANGE BILIARY DRAIN  01/20/2024  IR PERC CHOLECYSTOSTOMY  08/17/2022  IR RADIOLOGIST EVAL & MGMT  09/28/2022  IR RADIOLOGIST EVAL & MGMT  10/10/2022 Peyton JINNY Rummer 09/03/2024, 4:13 PM       Scheduled Meds:  amLODipine   10 mg Oral Daily   atorvastatin   40 mg Oral q AM   doxycycline   100 mg Oral Q12H   enoxaparin  (LOVENOX ) injection  40 mg Subcutaneous Q24H   guaiFENesin   600 mg Oral BID   insulin  aspart  0-6 Units Subcutaneous Q4H   pantoprazole   40 mg Oral Daily   sodium chloride  flush  3 mL Intravenous Q12H   Continuous Infusions:  cefTRIAXone  (ROCEPHIN )  IV 2 g (09/03/24 1822)          Derryl Duval, MD [facility] 09/04/2024, 3:58 PM

## 2024-09-04 NOTE — TOC Progression Note (Signed)
 Transition of Care [facility]) - Progression Note    Patient Details  Name: Andre Stout MRN: [id number] Date of Birth: 30-Aug-1940  [facility] CM/SW Contact  Remi Lopata A [place], LCSW Phone Number: 09/04/2024, 2:59 PM  Clinical Narrative:     CSW spoke with [place] at [facility]. He is a short term pt at facility. Requested CSW start insurance authorization, CSW to start once pt is close to being medically stable.   Pt can DC over weekend if stable for DC. POC is [place] at [facility] for weekend DC.   CSW will continue to follow.                     Expected Discharge Plan and Services                                               Social Drivers of Health (SDOH) Interventions SDOH Screenings   Food Insecurity: Patient Unable To Answer (09/02/2024)  Housing: Unknown (09/02/2024)  Transportation Needs: Patient Unable To Answer (09/02/2024)  Utilities: Patient Unable To Answer (09/02/2024)  Alcohol Screen: Low Risk  (07/24/2023)  Depression (PHQ2-9): Low Risk  (07/24/2023)  Financial Resource Strain: Low Risk  (07/24/2023)  Physical Activity: Inactive (07/24/2023)  Social Connections: Patient Unable To Answer (09/02/2024)  Stress: No Stress Concern Present (07/24/2023)  Tobacco Use: Medium Risk (05/07/2024)  Health Literacy: Adequate Health Literacy (07/24/2023)    Readmission Risk Interventions    08/15/2022    9:25 AM  Readmission Risk Prevention Plan  Transportation Screening Complete  PCP or Specialist Appt within 5-7 Days Complete  Home Care Screening Complete  Medication Review (RN CM) Complete

## 2024-09-05 DIAGNOSIS — R112 Nausea with vomiting, unspecified: Secondary | ICD-10-CM | POA: Diagnosis not present

## 2024-09-05 LAB — GLUCOSE, CAPILLARY
Glucose-Capillary: 97 mg/dL (ref 70–99)
Glucose-Capillary: 84 mg/dL (ref 70–99)
Glucose-Capillary: 83 mg/dL (ref 70–99)
Glucose-Capillary: 211 mg/dL — ABNORMAL HIGH (ref 70–99)
Glucose-Capillary: 207 mg/dL — ABNORMAL HIGH (ref 70–99)
Glucose-Capillary: 416 mg/dL — ABNORMAL HIGH (ref 70–99)
Glucose-Capillary: 387 mg/dL — ABNORMAL HIGH (ref 70–99)

## 2024-09-05 LAB — BASIC METABOLIC PANEL WITH GFR
Anion gap: 12 (ref 5–15)
BUN: 15 mg/dL (ref 8–23)
CO2: 20 mmol/L — ABNORMAL LOW (ref 22–32)
Calcium: 9.1 mg/dL (ref 8.9–10.3)
Chloride: 102 mmol/L (ref 98–111)
Creatinine, Ser: 1.12 mg/dL (ref 0.61–1.24)
GFR, Estimated: >60 mL/min (ref >60)
Glucose, Bld: 91 mg/dL (ref 70–99)
Potassium: 4.6 mmol/L (ref 3.5–5.1)
Sodium: 134 mmol/L — ABNORMAL LOW (ref 135–145)

## 2024-09-05 MED ORDER — INSULIN ASPART 100 UNIT/ML IJ SOLN
8.0000 [IU] | Freq: Once | INTRAMUSCULAR | Status: AC
  Administered 2024-09-05: 8 [IU] via SUBCUTANEOUS

## 2024-09-05 MED ORDER — ENSURE PLUS HIGH PROTEIN PO LIQD
237.0000 mL | Freq: Two times a day (BID) | ORAL | Status: DC
  Administered 2024-09-05 – 2024-09-08 (×6): 237 mL via ORAL

## 2024-09-05 MED ORDER — AMOXICILLIN-POT CLAVULANATE 875-125 MG PO TABS: 1.0000 | ORAL_TABLET | Freq: Two times a day (BID) | ORAL | 0 refills | Status: DC

## 2024-09-05 MED ORDER — LANTUS SOLOSTAR 100 UNIT/ML ~~LOC~~ SOPN: 15.0000 [IU] | PEN_INJECTOR | Freq: Every day | SUBCUTANEOUS | Status: DC

## 2024-09-05 NOTE — Plan of Care (Signed)

## 2024-09-05 NOTE — Progress Notes (Signed)
 PROGRESS NOTE    [facility]  FMW:[id number] DOB: [birth year] DOA: 09/02/2024 PCP: Jason Leita Repine, FNP   Brief Narrative:   Andre Stout is a 84 y.o. male with medical history significant of CHB s/ppacemaker, HTN, DM2, prior CVA, cholecystitis with chronic percutaneous cholecystostomy tube, prostate cancer, CKD 3A presents with nausea and vomiting and confusion.   Found to have hyponatremia 127.  Also chest x-ray noted patchy bilateral airspace disease with mild pulmonary edema and cardiomegaly.  Subjective: Patient seen and examined at the bedside earlier today.  He was not in any acute distress.  Patient was able to answer simple questions but was confused.  This is his baseline.  Patient is medically stable for discharge to nursing home, case management notified.  Potential discharge on Monday  Assessment & Plan:   Principal Problem:   Nausea and vomiting Active Problems:   Aspiration pneumonia (HCC)   Pneumonia   Uncontrolled diabetes mellitus with hypoglycemia (HCC)   Acute metabolic encephalopathy   AKI (acute kidney injury) (HCC)   Hypertension associated with diabetes (HCC)   Hyperkalemia   Hyponatremia   Microcytic anemia   Nausea and vomiting Source unclear.  Abdominal pelvis CT scan is negative for any acute abnormalities.  Continue symptomatic management.   Aspiration pneumonia Patient noted to have patchy bilateral airspace opacities on chest x-ray.  Thought likely secondary to episodes of nausea and vomiting prior to arrival at the nursing facility.   Received ceftriaxone  and Doxy.  Switched to Augmentin  for 4 more days. Evaluated by SLP. Diet recommendations as follows:    Dysphagia 2 (fine chop);Thin liquid (via 5cc Provale cup) Medication Administration: Crushed with puree Supervision: Full supervision/cueing for compensatory strategies Compensations: Minimize environmental distractions;Slow rate;Small sips/bites Postural Changes and/or Swallow  Maneuvers: Seated upright 90 degrees;Upright 30-60 min after meal     Acute metabolic encephalopathy: In the setting of dementia, patient appears to be at baseline.   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  However patient not hypoxic and not symptomatic.     Uncontrolled diabetes mellitus type 2 with hypoglycemia  Blood sugars noted to be as low as 41 on admission, now rising levels.  Likely secondary to dehydration and continued insulin  use.  On discharge Lantus  decreased to 15 unit nightly.  Continue sliding scale coverage.     Acute kidney injury Patient presents with creatinine elevated up to 1.64 with BUN 28.  Baseline creatinine previously noted to be around 1.  Now resolved.   Essential hypertension Blood pressures fairly controlled, continue amlodipine .   Hyperlipidemia: Continue atorvastatin .   Hyperkalemia Resolved.   Microcytic hypochromic anemia Hemoglobin stable.  Extra-axial mass Incidental finding noted on CT unchanged from the CT scan compared from 07/22/2024.  Family aware.  No further intervention   NDVT prophylaxis: enoxaparin  (LOVENOX ) injection 30 mg Start: 09/02/24 0900   Code Status: Limited: Do not attempt resuscitation (DNR) -DNR-LIMITED -Do Not Intubate/DNI   Family Communication:  None present at bedside.     Status is: Observation The patient will require care spanning > 2 midnights and should be moved to inpatient because: Patient n.p.o., needs to be evaluated by SLP.     Estimated body mass index is 31.26 kg/m as calculated from the following:   Height as of this encounter: 5' (1.524 m).  Weight as of this encounter: 72.6 kg.   Nutritional Assessment: Body mass index is 31.26 kg/m.[name] Seen by dietician.  I agree with the  assessment and plan as outlined below: Nutrition Status:   . Skin Assessment: I have examined the patient's skin and I agree with the wound assessment as performed by the wound care RN as outlined below:   Consultants:  None   Procedures:  None   Antimicrobials:  Anti-infectives (From admission, onward)        Start     Dose/Rate Route Frequency Ordered Stop    09/02/24 2200   doxycycline  (VIBRA -TABS) tablet 100 mg        100 mg Oral Every 12 hours 09/02/24 1740      09/02/24 1830   cefTRIAXone  (ROCEPHIN ) 2 g in sodium chloride  0.9 % 100 mL IVPB        2 g 200 mL/hr over 30 Minutes Intravenous Every 24 hours 09/02/24 1734      09/02/24 0345   ceFEPIme  (MAXIPIME ) 2 g in sodium chloride  0.9 % 100 mL IVPB        2 g 200 mL/hr over 30 Minutes Intravenous  Once 09/02/24 0333 09/02/24 0443    09/02/24 0345   metroNIDAZOLE  (FLAGYL ) IVPB 500 mg        500 mg 100 mL/hr over 60 Minutes Intravenous  Once 09/02/24 0333 09/02/24 0540    09/02/24 0345   vancomycin  (VANCOCIN ) IVPB 1000 mg/200 mL premix  Status:  Discontinued        1,000 mg 200 mL/hr over 60 Minutes Intravenous  Once 09/02/24 0333 09/02/24 0336    09/02/24 0345   vancomycin  (VANCOREADY) IVPB 1750 mg/350 mL        1,750 mg 175 mL/hr over 120 Minutes Intravenous  Once 09/02/24 0336 09/02/24 1057             Subjective: Patient seen and examined, patient had hand mittens.  He was alert and oriented to self.  No complaints.  Appeared comfortable.   DVT prophylaxis:  Code Status:   Family Communication:  Disposition Plan: Status is: Inpatient    Objective: Vitals:   09/05/24 0526 09/05/24 0745 09/05/24 1158 09/05/24 1604  BP: (!) 138/51 137/65 (!) 112/52 (!) 140/79  Pulse: (!) 58 63 61 62  Resp: 17 19 20 19   Temp: 97.7 F (36.5 C) 97.8 F (36.6 C) 97.9 F (36.6 C) 97.9 F (36.6 C)  TempSrc:      SpO2: 100% 99% 99% 100%  Weight:      Height:       No intake or output data in the 24 hours ending 09/05/24  1618  Filed Weights   09/02/24 0327  Weight: 72.6 kg    Examination:  General exam: Appears calm and comfortable  Respiratory system: Bilateral decreased breath sounds at bases Cardiovascular system: S1 & S2 heard, Rate controlled Gastrointestinal system: Abdomen is nondistended, soft and nontender. Normal bowel sounds heard. Extremities: No cyanosis, clubbing, edema  Central nervous system: Alert and oriented. No focal neurological deficits. Moving extremities Skin: No rashes, lesions or ulcers Psychiatry: Judgement and insight appear normal. Mood & affect appropriate.     Data Reviewed: I have personally reviewed following labs and imaging studies  CBC: Recent Labs  Lab 09/02/24 0240 09/03/24 0157  WBC 9.2 6.9  NEUTROABS 6.3  --   HGB 10.8* 10.8*  HCT 34.2* 34.0*  MCV 79.4* 78.2*  PLT 362 336   Basic Metabolic Panel:  Recent Labs  Lab 09/02/24 0240 09/03/24 0157 09/05/24 0201  NA 127* 128* 134*  K 5.3* 4.6 4.6  CL 95* 97* 102  CO2 22 20* 20*  GLUCOSE 85 183* 91  BUN 20 12 15   CREATININE 1.64* 1.21 1.12  CALCIUM  9.1 8.6* 9.1   GFR: Estimated Creatinine Clearance: 41.7 mL/min (by C-G formula based on SCr of 1.12 mg/dL). Liver Function Tests: Recent Labs  Lab 09/02/24 0240  AST 19  ALT 11  ALKPHOS 68  BILITOT 1.0  PROT 6.8  ALBUMIN  3.1*   No results for input(s): LIPASE, AMYLASE in the last 168 hours. No results for input(s): AMMONIA in the last 168 hours. Coagulation Profile: Recent Labs  Lab 09/02/24 0240  INR 1.1   Cardiac Enzymes: No results for input(s): CKTOTAL, CKMB, CKMBINDEX, TROPONINI in the last 168 hours. BNP (last 3 results) No results for input(s): PROBNP in the last 8760 hours. HbA1C: No results for input(s): HGBA1C in the last 72 hours. CBG: Recent Labs  Lab 09/05/24 0113 09/05/24 0517 09/05/24 0747 09/05/24 1158 09/05/24 1603  GLUCAP 97 84 83 211* 207*   Lipid Profile: No results for input(s):  CHOL, HDL, LDLCALC, TRIG, CHOLHDL, LDLDIRECT in the last 72 hours. Thyroid  Function Tests: No results for input(s): TSH, T4TOTAL, FREET4, T3FREE, THYROIDAB in the last 72 hours. Anemia Panel: No results for input(s): VITAMINB12, FOLATE, FERRITIN, TIBC, IRON, RETICCTPCT in the last 72 hours. Sepsis Labs: Recent Labs  Lab 09/02/24 0257 09/02/24 0300  PROCALCITON <0.10  --   LATICACIDVEN  --  1.1    Recent Results (from the past 240 hours)  Blood Culture (routine x 2)     Status: None (Preliminary result)   Collection Time: 09/02/24  2:40 AM   Specimen: BLOOD RIGHT ARM  Result Value Ref Range Status   Specimen Description BLOOD RIGHT ARM  Final   Special Requests   Final    BOTTLES DRAWN AEROBIC AND ANAEROBIC Blood Culture adequate volume   Culture   Final    NO GROWTH 3 DAYS Performed at [facility], [address] [place], [place] [address]    Report Status PENDING  Incomplete  Blood Culture (routine x 2)     Status: None (Preliminary result)   Collection Time: 09/02/24  4:04 AM   Specimen: BLOOD LEFT ARM  Result Value Ref Range Status   Specimen Description BLOOD LEFT ARM  Final   Special Requests   Final    BOTTLES DRAWN AEROBIC AND ANAEROBIC Blood Culture adequate volume   Culture   Final    NO GROWTH 3 DAYS Performed at [facility], [address] [place], [place] [address]    Report Status PENDING  Incomplete         Radiology Studies: No results found.       Scheduled Meds:  amLODipine   10 mg Oral Daily   atorvastatin   40 mg Oral q AM   doxycycline   100 mg Oral Q12H   enoxaparin  (LOVENOX ) injection  40 mg Subcutaneous Q24H   feeding supplement  237 mL Oral BID BM   guaiFENesin   600 mg Oral BID   insulin  aspart  0-6 Units Subcutaneous Q4H   insulin  glargine  10 Units Subcutaneous QHS   pantoprazole   40 mg Oral Daily   sodium chloride  flush  3 mL Intravenous Q12H   Continuous Infusions:  cefTRIAXone   (ROCEPHIN )  IV 2 g (09/04/24 1757)  Derryl Duval, MD [facility] 09/05/2024, 4:18 PM

## 2024-09-05 NOTE — TOC Progression Note (Signed)
 Transition of Care [facility]) - Progression Note    Patient Details  Name: Andre Stout MRN: [id number] Date of Birth: 1940-10-05  [facility]) CM/SW Contact  Gwenn Frieze [place], [place] Phone Number: 09/05/2024, 12:07 PM  Clinical Narrative: Per MD, pt medically stable for dc to SNF. Plan for return to [facility] pending new auth. Per TOC note from yesterday, auth not started. [facility] auth request has been submitted today for [facility], ref #[id number]. MD aware of barrier to dc.   Frieze Gwenn, MSW, LCSW [phone] (coverage)                          Expected Discharge Plan and Services         Expected Discharge Date: 09/05/24                                     Social Drivers of Health (SDOH) Interventions SDOH Screenings   Food Insecurity: Patient Unable To Answer (09/02/2024)  Housing: Unknown (09/02/2024)  Transportation Needs: Patient Unable To Answer (09/02/2024)  Utilities: Patient Unable To Answer (09/02/2024)  Alcohol Screen: Low Risk  (07/24/2023)  Depression (PHQ2-9): Low Risk  (07/24/2023)  Financial Resource Strain: Low Risk  (07/24/2023)  Physical Activity: Inactive (07/24/2023)  Social Connections: Patient Unable To Answer (09/02/2024)  Stress: No Stress Concern Present (07/24/2023)  Tobacco Use: Medium Risk (05/07/2024)  Health Literacy: Adequate Health Literacy (07/24/2023)    Readmission Risk Interventions    08/15/2022    9:25 AM  Readmission Risk Prevention Plan  Transportation Screening Complete  PCP or Specialist Appt within 5-7 Days Complete  Home Care Screening Complete  Medication Review (RN CM) Complete

## 2024-09-05 NOTE — Discharge Summary (Signed)
 Physician Discharge Summary   Patient: Andre Stout MRN: [id number] DOB: 04-12-1940  Admit date:     09/02/2024  Discharge date: 09/08/24  Discharge Physician: Derryl Duval   PCP: Jason Leita Repine, FNP   Recommendations at discharge:   Lantus  decreased to 15 mg nightly.  Please monitor blood glucose closely Follow-up with PCP in 1 week, recommend laboratory work to ensure stable sodium  Discharge Diagnoses: Principal Problem:   Nausea and vomiting Active Problems:   Aspiration pneumonia (HCC)   Pneumonia   Uncontrolled diabetes mellitus with hypoglycemia (HCC)   Acute metabolic encephalopathy   AKI (acute kidney injury) (HCC)   Hypertension associated with diabetes (HCC)   Hyperkalemia   Hyponatremia   Microcytic anemia  Resolved Problems:   * No resolved hospital problems. *  Hospital Course: 84 year old male with history of dementia, diabetes mellitus came into the ER with complaints of nausea and vomiting.  Found to have sodium of 128 as well as chest x-ray findings per concerning for pneumonia.  Patient admitted for hyponatremia/possible aspiration pneumonia.  He was also reportedly hypoglycemic in the setting of AKI.  Hospital course remained uncomplicated.  Details of hospitalization as below.  Discharged back to SNF.  Assessment and Plan:  Nausea and vomiting Source unclear.  Abdominal pelvis CT scan is negative for any acute abnormalities.  Continue symptomatic management.   Aspiration pneumonia Patient noted to have patchy bilateral airspace opacities on chest x-ray.  Thought likely secondary to episodes of nausea and vomiting prior to arrival at the nursing facility.   Received ceftriaxone  and Doxy.  Discharged on Augmentin  to complete total 7 days abx Evaluated by SLP. Diet recommendations as follows:   Dysphagia 2 (fine chop);Thin liquid (via 5cc Provale cup) Medication Administration: Crushed with puree Supervision: Full supervision/cueing for  compensatory strategies Compensations: Minimize environmental distractions;Slow rate;Small sips/bites Postural Changes and/or Swallow Maneuvers: Seated upright 90 degrees;Upright 30-60 min after meal    Acute metabolic encephalopathy: In the setting of dementia, patient appears to be at baseline.   Hyponatremia: Sodium 128 on admission, received IV fluids, improved to 134 prior to discharge.  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  However patient not hypoxic and not symptomatic.     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  Likely secondary to dehydration and continued insulin  use.  On discharge, will continue Lantus  10 unit nightly along with sliding scale coverage.  Was apparently taking NPH 70/30 at SNF  Acute kidney injury Patient presents with creatinine elevated up to 1.64 with BUN 28.  Baseline creatinine previously noted to be around 1.  Now resolved.  CKD stage 3a- monitor   Essential hypertension Blood pressures fairly controlled, continue amlodipine .   Hyperlipidemia: Continue atorvastatin .   Hyperkalemia Resolved.   Microcytic hypochromic anemia Hemoglobin stable.  Extra-axial mass Incidental finding noted on CT unchanged from the CT scan compared from 07/22/2024.  Family aware.  No further intervention       Consultants:  Procedures performed:   Disposition: Skilled nursing facility Diet recommendation: constant carb  Dysphagia 2 (fine chop);Thin liquid (via 5cc Provale cup) Medication Administration: Crushed with puree Supervision: Full supervision/cueing for compensatory strategies Compensations: Minimize environmental distractions;Slow rate;Small sips/bites Postural Changes and/or Swallow Maneuvers: Seated upright 90  degrees;Upright 30-60 min after meal Discharge Diet Orders (From admission,  onward)     Start     Ordered   09/05/24 0000  Diet - low sodium heart healthy        09/05/24 1156           Carb modified diet DISCHARGE MEDICATION: Allergies as of 09/08/2024   No Known Allergies      Medication List     STOP taking these medications    Lantus  SoloStar 100 UNIT/ML Solostar Pen Generic drug: insulin  glargine Replaced by: insulin  glargine 100 UNIT/ML injection   NovoLOG  70/30 FlexPen (70-30) 100 UNIT/ML FlexPen Generic drug: insulin  aspart protamine - aspart       TAKE these medications    acetaminophen  500 MG tablet Commonly known as: TYLENOL  Take 1,000 mg by mouth every 6 (six) hours as needed for mild pain (pain score 1-3) or moderate pain (pain score 4-6).   amLODipine  10 MG tablet Commonly known as: NORVASC  Take 1 tablet (10 mg total) by mouth daily.   amoxicillin -clavulanate 875-125 MG tablet Commonly known as: AUGMENTIN  Take 1 tablet by mouth 2 (two) times daily.   atorvastatin  40 MG tablet Commonly known as: LIPITOR TAKE 1 TABLET BY MOUTH EVERY DAY What changed: when to take this   Azelastine  HCl 137 MCG/SPRAY Soln PLACE 1 SPRAY INTO BOTH NOSTRILS 2 (TWO) TIMES DAILY AS NEEDED. NEEDS APPT What changed:  reasons to take this additional instructions   FreeStyle Libre 3 Sensor Misc 1 each by Does not apply route every 14 (fourteen) days.   insulin  aspart 100 UNIT/ML injection Commonly known as: novoLOG  Inject 0-6 Units into the skin every 4 (four) hours.   insulin  glargine 100 UNIT/ML injection Commonly known as: LANTUS  Inject 0.1 mLs (10 Units total) into the skin at bedtime. Replaces: Lantus  SoloStar 100 UNIT/ML Solostar Pen   Insulin  Pen Needle 32G X 4 MM Misc Use to inject insulin  3 times a day   Insulin  Syringe-Needle U-100 31G X 5/16 1 ML Misc Commonly known as: BD Insulin  Syringe U/F USE 2 DAILY   magnesium  oxide 400 (240 Mg) MG  tablet Commonly known as: MAG-OX TAKE 1 TABLET BY MOUTH TWICE A DAY   metFORMIN  500 MG tablet Commonly known as: GLUCOPHAGE  Take 500 mg by mouth 2 (two) times daily with a meal.   omeprazole  40 MG capsule Commonly known as: PRILOSEC Take 40 mg by mouth in the morning.   OneTouch Delica Lancets 33G Misc Use to check blood sugar 4 times per day. Dx code: E11.9   OneTouch Ultra test strip Generic drug: glucose blood USE TO MONITOR GLUCOSE LEVELS 4 TIMES PER DAY E11.9   PreserVision AREDS 2 Caps Take 1 capsule by mouth every evening.        Follow-up Information     Jason Leita Repine, FNP Follow up in 1 week(s).   Specialty: Internal Medicine Contact information: [address] [place] [address] [phone]                Discharge Exam: Andre Stout   09/02/24 0327  Weight: 72.6 kg     Condition at discharge: fair  The results of significant diagnostics from this hospitalization (including imaging, microbiology, ancillary and laboratory) are listed below for reference.   Imaging Studies: DG Swallowing Func-Speech Pathology Result Date: 09/03/2024 Table formatting from the original result was not included. Modified Barium Swallow Study Patient Details Name: Andre Stout MRN: [id number] Date of Birth: 08-18-1940 Today's Date: 09/03/2024 HPI/PMH: HPI: PETR BONTEMPO  is an 84 yo male presenting to ED from [facility] 9[facility]3 with AMS and vomiting. CT shows patchy, streaky and ground-glass opacities within the bilateral lung bases. Seen most recently by SLP July 2025 (admitted with bacteremia and sepsis) with AMS resulting in cognitive dysphagia with biggest risk fo aspiration being post prandial given poor positioning, signs of GER, and recent history of vomiting.  PMH includes CHB s/p PPM, cholecystitis with chronic percutaneous cholecystostomy tube, prostate cancer, CKD 3A, prior CVA, HTN, T2DM Clinical Impression:  Pt presents with a moderate  oropharyngeal dysphagia per MBSS results today. There was delayed silent aspiration of thin liquid residue observed during the study. Pt was unable to follow directions for compensatory swallow strategies. No apparent aspiration of nectar-thick liquids, puree, or soft solid observed. Oral deficits characterized by reduced oral strength and coordination resulting in pre-spill of majority of trials to the level of the pyriform sinuses before the swallow. Increased oral prep and transit time observed with soft solid. Pharyngeal deficits characterized by reduced base of tongue retraction, reduced laryngeal vestibule closure, and reduced pharyngeal stripping. Plan to start a mechanical altered/ground diet and nectar-thick liquids. Pt may be able to tolerate thin liquids with use of 5cc Provale cup since no penetration/aspiration was observed with spoon sips of thin liquids. Pt demonstrated oropharyngeal residue with both thin liquids and nectar-thick liquids and will be at an elevated risk of aspiration of residue of either consistency. Recommend follow up discussion with pt's family to talk about thin vs nectar-thick liquids and risks/benefits of thickening liquids if considering thickened liquids for a longer term recommendation. Attempted to call family today, though they were unavailable. SLP to follow up to assess diet tolerance and provide family education. Factors that may increase risk of adverse event in presence of aspiration [name] & [name] 2021): Factors that may increase risk of adverse event in presence of aspiration [name] & [name] 2021): Reduced cognitive function; Dependence for feeding and/or oral hygiene Recommendations/Plan: Swallowing Evaluation Recommendations Swallowing Evaluation Recommendations Recommendations: PO diet PO Diet Recommendation: Dysphagia 2 (Finely chopped); Mildly thick liquids (Level 2, nectar thick) Liquid Administration via: Cup; Straw Medication Administration: Crushed with  puree Supervision: Full assist for feeding; Full supervision/cueing for swallowing strategies Swallowing strategies  : Minimize environmental distractions; Slow rate; Small bites/sips Postural changes: Position pt fully upright for meals Oral care recommendations: Oral care QID (4x/day) Treatment Plan Treatment Plan Treatment recommendations: Therapy as outlined in treatment plan below Follow-up recommendations: Skilled nursing-short term rehab (<3 hours/day) Functional status assessment: Patient has had a recent decline in their functional status and demonstrates the ability to make significant improvements in function in a reasonable and predictable amount of time. Treatment frequency: Min 2x/week Treatment duration: 4 weeks Interventions: Aspiration precaution training; Trials of upgraded texture/liquids; Diet toleration management by SLP; Patient/family education; Compensatory techniques Recommendations Recommendations for follow up therapy are one component of a multi-disciplinary discharge planning process, led by the attending physician.  Recommendations may be updated based on patient status, additional functional criteria and insurance authorization. Assessment: Orofacial Exam: Orofacial Exam Oral Cavity: Oral Hygiene: WFL Oral Cavity - Dentition: Dentures, top; Dentures, bottom Anatomy: Anatomy: WFL Boluses Administered: Boluses Administered Boluses Administered: Thin liquids (Level 0); Mildly thick liquids (Level 2, nectar thick); Puree; Solid  Oral Impairment Domain: Oral Impairment Domain Lip Closure: No labial escape Tongue control during bolus hold: Posterior escape of less than half of bolus Bolus preparation/mastication: Slow prolonged chewing/mashing with complete recollection Bolus transport/lingual motion: Delayed initiation of tongue motion (  oral holding) Oral residue: Trace residue lining oral structures Location of oral residue : Tongue Initiation of pharyngeal swallow : Pyriform sinuses   Pharyngeal Impairment Domain: Pharyngeal Impairment Domain Soft palate elevation: No bolus between soft palate (SP)/pharyngeal wall (PW) Laryngeal elevation: Complete superior movement of thyroid  cartilage with complete approximation of arytenoids to epiglottic petiole Anterior hyoid excursion: Complete anterior movement Epiglottic movement: Complete inversion Laryngeal vestibule closure: Incomplete, narrow column air/contrast in laryngeal vestibule Pharyngeal stripping wave : Present - diminished Pharyngoesophageal segment opening: Complete distension and complete duration, no obstruction of flow Tongue base retraction: Narrow column of contrast or air between tongue base and PPW Pharyngeal residue: Collection of residue within or on pharyngeal structures Location of pharyngeal residue: Tongue base; Valleculae; Pyriform sinuses  Esophageal Impairment Domain: Esophageal Impairment Domain Esophageal clearance upright position: Complete clearance, esophageal coating Pill: No data recorded Penetration/Aspiration Scale Score: Penetration/Aspiration Scale Score 1.  Material does not enter airway: Mildly thick liquids (Level 2, nectar thick); Puree; Solid 2.  Material enters airway, remains ABOVE vocal cords then ejected out: Thin liquids (Level 0) 8.  Material enters airway, passes BELOW cords without attempt by patient to eject out (silent aspiration) : Thin liquids (Level 0) Compensatory Strategies: Compensatory Strategies Compensatory strategies: No (unable to follow commands for strategies)   General Information: Caregiver present: No (attempted to call, though no answer)  Diet Prior to this Study: NPO   Temperature : Normal   Respiratory Status: WFL   Supplemental O2: None (Room air)   History of Recent Intubation: No  Behavior/Cognition: Alert; Cooperative; Confused; Requires cueing; Distractible Self-Feeding Abilities: Dependent for feeding Baseline vocal quality/speech: Not observed Volitional Cough: Unable to  elicit Volitional Swallow: Able to elicit (max cues) Exam Limitations: Poor positioning Goal Planning: Prognosis for improved oropharyngeal function: Fair Barriers to Reach Goals: Cognitive deficits No data recorded Patient/Family Stated Goal: none stated Consulted and agree with results and recommendations: Patient; Nurse (family not available) Pain: Pain Assessment Pain Assessment: No/denies pain End of Session: Start Time:SLP Start Time (ACUTE ONLY): 1330 Stop Time: SLP Stop Time (ACUTE ONLY): 1355 Time Calculation:SLP Time Calculation (min) (ACUTE ONLY): 25 min Charges: SLP Evaluations $ SLP Speech Visit: 1 Visit SLP Evaluations $BSS Swallow: 1 Procedure $MBS Swallow: 1 Procedure SLP visit diagnosis: SLP Visit Diagnosis: Dysphagia, oropharyngeal phase (R13.12) Past Medical History: Past Medical History: Diagnosis Date  Diabetes mellitus without complication (HCC)   Glaucoma   Heart block   complete heart block  Hypertension   Prostate cancer (HCC)   Been 3-4 years ago Past Surgical History: Past Surgical History: Procedure Laterality Date  CATARACT EXTRACTION Bilateral   INSERTION PROSTATE RADIATION SEED    IR EXCHANGE BILIARY DRAIN  08/19/2023  IR EXCHANGE BILIARY DRAIN  12/19/2023  IR EXCHANGE BILIARY DRAIN  01/20/2024  IR PERC CHOLECYSTOSTOMY  08/17/2022  IR RADIOLOGIST EVAL & MGMT  09/28/2022  IR RADIOLOGIST EVAL & MGMT  10/10/2022 Allyson J Roberts 09/03/2024, 4:13 PM  CT ABDOMEN PELVIS WO CONTRAST Result Date: 09/02/2024 EXAM: CT ABDOMEN AND PELVIS WITHOUT CONTRAST 09/02/2024 05:55:10 AM TECHNIQUE: CT of the abdomen and pelvis was performed without the administration of intravenous contrast. Multiplanar reformatted images are provided for review. Automated exposure control, iterative reconstruction, and/or weight-based adjustment of the mA/kV was utilized to reduce the radiation dose to as low as reasonably achievable. COMPARISON: CT of the abdomen and pelvis dated 07/21/2024. CLINICAL HISTORY: Abdominal pain,  acute, nonlocalized. Patient has been vomiting since 5pm. FINDINGS: LOWER CHEST: Patchy, streaky and ground-glass  opacities present within the lung bases bilaterally. There is a mild-to-moderate right pleural effusion and a mild left effusion. LIVER: Normal size and contour. GALLBLADDER AND BILE DUCTS: There are 2 stones lying dependently within the gallbladder. There is no evidence of cholecystitis. SPLEEN: Normal size. No focal lesion. PANCREAS: No mass. No ductal dilatation. ADRENAL GLANDS: Normal appearance. No mass. KIDNEYS, URETERS AND BLADDER: There is a prominent cyst arising laterally from the right kidney, measuring approximately 11 x 6.5 x 9.0 cm. Per consensus, no follow-up is needed for simple Bosniak type 1 and 2 renal cysts, unless the patient has a malignancy history or risk factors. No stones in the kidneys or ureters. No hydronephrosis. No perinephric or periureteral stranding. Urinary bladder is unremarkable. GI AND BOWEL: Stomach demonstrates no acute abnormality. There is no bowel obstruction. No bowel wall thickening. PERITONEUM AND RETROPERITONEUM: No ascites. No free air. VASCULATURE: The abdominal aorta demonstrates moderate calcific atheromatous disease. LYMPH NODES: No lymphadenopathy. REPRODUCTIVE ORGANS: There are prostate seed implants present. BONES AND SOFT TISSUES: No acute osseous abnormality. There is a fat-containing periumbilical hernia. IMPRESSION: 1. No acute findings in the abdomen or pelvis related to the clinical history of abdominal pain and vomiting. 2. Mild-to-moderate right pleural effusion and mild left effusion. Electronically signed by: Evalene Coho MD 09/02/2024 06:26 AM EDT RP Workstation: [id number]   DG Chest Port 1 View Result Date: 09/02/2024 EXAM: 1 VIEW XRAY OF THE CHEST 09/02/2024 02:53:00 AM COMPARISON: 07/21/2024 CLINICAL HISTORY: Questionable sepsis - evaluate for abnormality. FINDINGS: LUNGS AND PLEURA: Low lung volumes. Patchy bilateral airspace  disease. Mild pulmonary edema. No pleural effusion. No pneumothorax. HEART AND MEDIASTINUM: Cardiomegaly. Aortic atherosclerotic calcification. BONES AND SOFT TISSUES: No acute osseous abnormality. IMPRESSION: 1. Patchy bilateral airspace disease and mild pulmonary edema 2. Cardiomegaly and aortic atherosclerotic calcification. Electronically signed by: Franky Stanford MD 09/02/2024 03:23 AM EDT RP Workstation: [id number]   CT HEAD WO CONTRAST ( ) Result Date: 09/02/2024 EXAM: CT HEAD WITHOUT CONTRAST 09/02/2024 03:05:38 AM TECHNIQUE: CT of the head was performed without the administration of intravenous contrast. Automated exposure control, iterative reconstruction, and/or weight based adjustment of the mA/kV was utilized to reduce the radiation dose to as low as reasonably achievable. COMPARISON: 07/22/2024 CLINICAL HISTORY: Mental status change, unknown cause. EMS reports pt has been vomiting since 5pm, EMS also reports per nursing facility staff pt has had increased confusion and reports confusion at baseline. Unknown how long increased confusion has been going on. FINDINGS: BRAIN AND VENTRICLES: No acute hemorrhage. No evidence of acute infarct. No hydrocephalus. No extra-axial collection. No mass effect or midline shift. Old right cerebellar infarct. Chronic ischemic white matter changes and generalized volume loss. ORBITS: No acute abnormality. SINUSES: No acute abnormality. SOFT TISSUES AND SKULL: No acute soft tissue abnormality. No skull fracture. 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 Chronic left mastoid effusion with areas of coalescence. IMPRESSION: 1. No acute intracranial abnormality. 2. 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 3. Old right cerebellar infarct. 4. Chronic ischemic white matter changes and generalized volume loss. Electronically signed by: Franky Stanford MD 09/02/2024 03:22 AM EDT RP  Workstation: [id number]    Microbiology: Results for orders placed or performed during the hospital encounter of 09/02/24  Blood Culture (routine x 2)     Status: None   Collection Time: 09/02/24  2:40 AM   Specimen: BLOOD RIGHT ARM  Result Value Ref Range Status   Specimen Description BLOOD  RIGHT ARM  Final   Special Requests   Final    BOTTLES DRAWN AEROBIC AND ANAEROBIC Blood Culture adequate volume   Culture   Final    NO GROWTH 5 DAYS Performed at [facility], [address], [place], [place] [address]    Report Status 09/07/2024 FINAL  Final  Blood Culture (routine x 2)     Status: None   Collection Time: 09/02/24  4:04 AM   Specimen: BLOOD LEFT ARM  Result Value Ref Range Status   Specimen Description BLOOD LEFT ARM  Final   Special Requests   Final    BOTTLES DRAWN AEROBIC AND ANAEROBIC Blood Culture adequate volume   Culture   Final    NO GROWTH 5 DAYS Performed at [facility], [address] [place], [place] [address]    Report Status 09/07/2024 FINAL  Final    Labs: CBC: Recent Labs  Lab 09/02/24 0240 09/03/24 0157  WBC 9.2 6.9  NEUTROABS 6.3  --   HGB 10.8* 10.8*  HCT 34.2* 34.0*  MCV 79.4* 78.2*  PLT 362 336   Basic Metabolic Panel: Recent Labs  Lab 09/02/24 0240 09/03/24 0157 09/05/24 0201  NA 127* 128* 134*  K 5.3* 4.6 4.6  CL 95* 97* 102  CO2 22 20* 20*  GLUCOSE 85 183* 91  BUN 20 12 15   CREATININE 1.64* 1.21 1.12  CALCIUM  9.1 8.6* 9.1   Liver Function Tests: Recent Labs  Lab 09/02/24 0240  AST 19  ALT 11  ALKPHOS 68  BILITOT 1.0  PROT 6.8  ALBUMIN  3.1*   CBG: Recent Labs  Lab 09/07/24 2359 09/08/24 0447 09/08/24 0734 09/08/24 0736 09/08/24 0808  GLUCAP 226* 193* 50* 51* 73    Discharge time spent: greater than 30 minutes.  Signed: Derryl Duval, MD [facility] 09/08/2024

## 2024-09-05 NOTE — Plan of Care (Signed)
   Problem: Education: Goal: Ability to describe self-care measures that may prevent or decrease complications (Diabetes Survival Skills Education) will improve Outcome: Progressing Goal: Individualized Educational Video(s) Outcome: Progressing

## 2024-09-06 DIAGNOSIS — R112 Nausea with vomiting, unspecified: Secondary | ICD-10-CM | POA: Diagnosis not present

## 2024-09-06 LAB — GLUCOSE, CAPILLARY
Glucose-Capillary: 107 mg/dL — ABNORMAL HIGH (ref 70–99)
Glucose-Capillary: 220 mg/dL — ABNORMAL HIGH (ref 70–99)
Glucose-Capillary: 255 mg/dL — ABNORMAL HIGH (ref 70–99)
Glucose-Capillary: 304 mg/dL — ABNORMAL HIGH (ref 70–99)
Glucose-Capillary: 340 mg/dL — ABNORMAL HIGH (ref 70–99)
Glucose-Capillary: 391 mg/dL — ABNORMAL HIGH (ref 70–99)
Glucose-Capillary: 54 mg/dL — ABNORMAL LOW (ref 70–99)
Glucose-Capillary: 67 mg/dL — ABNORMAL LOW (ref 70–99)
Glucose-Capillary: 91 mg/dL (ref 70–99)

## 2024-09-06 MED ORDER — AMOXICILLIN-POT CLAVULANATE 875-125 MG PO TABS: 1.0000 | ORAL_TABLET | Freq: Two times a day (BID) | ORAL | 0 refills | Status: DC

## 2024-09-06 NOTE — Progress Notes (Signed)
 PROGRESS NOTE    Andre Stout  FMW:[id number] DOB: 1940-05-17 DOA: 09/02/2024 PCP: Jason Leita Repine, FNP   Brief Narrative:   Andre Stout is a 84 y.o. male with medical history significant of CHB s/ppacemaker, HTN, DM2, prior CVA, cholecystitis with chronic percutaneous cholecystostomy tube, prostate cancer, CKD 3A presents with nausea and vomiting and confusion.   Found to have hyponatremia 127.  Also chest x-ray noted patchy bilateral airspace disease with mild pulmonary edema and cardiomegaly.  Subjective: Patient seen and examined at the bedside earlier today.  He was not in any acute distress.  Patient was sleeping on my visit.  Patient is medically stable for discharge to nursing home, case management notified.  Potential discharge on Monday  Assessment & Plan:   Principal Problem:   Nausea and vomiting Active Problems:   Aspiration pneumonia (HCC)   Pneumonia   Uncontrolled diabetes mellitus with hypoglycemia (HCC)   Acute metabolic encephalopathy   AKI (acute kidney injury) (HCC)   Hypertension associated with diabetes (HCC)   Hyperkalemia   Hyponatremia   Microcytic anemia   Nausea and vomiting Source unclear.  Abdominal pelvis CT scan is negative for any acute abnormalities.  Continue symptomatic management.   Aspiration pneumonia Patient noted to have patchy bilateral airspace opacities on chest x-ray.  Thought likely secondary to episodes of nausea and vomiting prior to arrival at the nursing facility.   Received ceftriaxone  and Doxy.  Switched to Augmentin  for 4 more days. Evaluated by SLP. Diet recommendations as follows:    Dysphagia 2 (fine chop);Thin liquid (via 5cc Provale cup) Medication Administration: Crushed with puree Supervision: Full supervision/cueing for compensatory strategies Compensations: Minimize environmental distractions;Slow rate;Small sips/bites Postural Changes and/or Swallow Maneuvers: Seated upright 90 degrees;Upright 30-60  min after meal     Acute metabolic encephalopathy: In the setting of dementia, patient appears to be at baseline.   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  However patient not hypoxic and not symptomatic.     Uncontrolled diabetes mellitus type 2 with hypoglycemia  Blood sugars noted to be as low as 41 on admission, now rising levels.  Likely secondary to dehydration and continued insulin  use.  On discharge Lantus  decreased to 15 unit nightly.  Continue sliding scale coverage.     Acute kidney injury Patient presents with creatinine elevated up to 1.64 with BUN 28.  Baseline creatinine previously noted to be around 1.  Now resolved.   Essential hypertension Blood pressures fairly controlled, continue amlodipine .   Hyperlipidemia: Continue atorvastatin .   Hyperkalemia Resolved.   Microcytic hypochromic anemia Hemoglobin stable.  Extra-axial mass Incidental finding noted on CT unchanged from the CT scan compared from 07/22/2024.  Family aware.  No further intervention   NDVT prophylaxis: enoxaparin  (LOVENOX ) injection 30 mg Start: 09/02/24 0900   Code Status: Limited: Do not attempt resuscitation (DNR) -DNR-LIMITED -Do Not Intubate/DNI   Family Communication:  None present at bedside.     Status is: Observation The patient will require care spanning > 2 midnights and should be moved to inpatient because: Patient n.p.o., needs to be evaluated by SLP.     Estimated body mass index is 31.26 kg/m as calculated from the following:   Height as of this encounter: 5' (1.524 m).   Weight as of this encounter: 72.6 kg.  Nutritional Assessment: Body mass index is 31.26 kg/m.[name] Seen by dietician.  I agree with the assessment and plan as outlined below: Nutrition  Status:   . Skin Assessment: I have examined the patient's skin and I agree with the wound assessment as performed by the wound care RN as outlined below:   Consultants:  None   Procedures:  None   Antimicrobials:  Anti-infectives (From admission, onward)        Start     Dose/Rate Route Frequency Ordered Stop    09/02/24 2200   doxycycline  (VIBRA -TABS) tablet 100 mg        100 mg Oral Every 12 hours 09/02/24 1740      09/02/24 1830   cefTRIAXone  (ROCEPHIN ) 2 g in sodium chloride  0.9 % 100 mL IVPB        2 g 200 mL/hr over 30 Minutes Intravenous Every 24 hours 09/02/24 1734      09/02/24 0345   ceFEPIme  (MAXIPIME ) 2 g in sodium chloride  0.9 % 100 mL IVPB        2 g 200 mL/hr over 30 Minutes Intravenous  Once 09/02/24 0333 09/02/24 0443    09/02/24 0345   metroNIDAZOLE  (FLAGYL ) IVPB 500 mg        500 mg 100 mL/hr over 60 Minutes Intravenous  Once 09/02/24 0333 09/02/24 0540    09/02/24 0345   vancomycin  (VANCOCIN ) IVPB 1000 mg/200 mL premix  Status:  Discontinued        1,000 mg 200 mL/hr over 60 Minutes Intravenous  Once 09/02/24 0333 09/02/24 0336    09/02/24 0345   vancomycin  (VANCOREADY) IVPB 1750 mg/350 mL        1,750 mg 175 mL/hr over 120 Minutes Intravenous  Once 09/02/24 0336 09/02/24 1057             Subjective: Patient seen and examined, patient had hand mittens.  He was alert and oriented to self.  No complaints.  Appeared comfortable.   DVT prophylaxis:  Code Status:   Family Communication:  Disposition Plan: Status is: Inpatient    Objective: Vitals:   09/05/24 2059 09/06/24 0048 09/06/24 0453 09/06/24 0742  BP: 129/65 113/66 (!) 114/52 (!) 120/46  Pulse: 63 60 60 60  Resp: 19 20 20 16   Temp: 98.4 F (36.9 C) 97.9 F (36.6 C) 97.6 F (36.4 C) 97.9 F (36.6 C)  TempSrc:      SpO2: 100% 100% 100% 100%  Weight:      Height:        Intake/Output Summary (Last 24 hours) at 09/06/2024 1108 Last data filed at 09/06/2024 0748 Gross per 24 hour   Intake 354 ml  Output --  Net 354 ml    Filed Weights   09/02/24 0327  Weight: 72.6 kg    Examination:  General exam: sleeping Respiratory system: Bilateral decreased breath sounds at bases Cardiovascular system: S1 & S2 heard, Rate controlled Gastrointestinal system: Abdomen is nondistended, soft and nontender. Normal bowel sounds heard. Extremities: No cyanosis, clubbing, edema    Data Reviewed: I have personally reviewed following labs and imaging studies  CBC: Recent Labs  Lab 09/02/24 0240 09/03/24 0157  WBC 9.2 6.9  NEUTROABS 6.3  --   HGB 10.8* 10.8*  HCT 34.2* 34.0*  MCV 79.4* 78.2*  PLT 362 336   Basic Metabolic Panel: Recent Labs  Lab 09/02/24 0240 09/03/24 0157 09/05/24 0201  NA 127* 128* 134*  K 5.3* 4.6 4.6  CL 95* 97* 102  CO2 22 20* 20*  GLUCOSE 85 183* 91  BUN 20 12 15   CREATININE 1.64* 1.21 1.12  CALCIUM  9.1 8.6* 9.1   GFR: Estimated Creatinine Clearance: 41.7 mL/min (by C-G formula based on SCr of 1.12 mg/dL). Liver Function Tests: Recent Labs  Lab 09/02/24 0240  AST 19  ALT 11  ALKPHOS 68  BILITOT 1.0  PROT 6.8  ALBUMIN  3.1*   No results for input(s): LIPASE, AMYLASE in the last 168 hours. No results for input(s): AMMONIA in the last 168 hours. Coagulation Profile: Recent Labs  Lab 09/02/24 0240  INR 1.1   Cardiac Enzymes: No results for input(s): CKTOTAL, CKMB, CKMBINDEX, TROPONINI in the last 168 hours. BNP (last 3 results) No results for input(s): PROBNP in the last 8760 hours. HbA1C: No results for input(s): HGBA1C in the last 72 hours. CBG: Recent Labs  Lab 09/06/24 0050 09/06/24 0418 09/06/24 0744 09/06/24 0820 09/06/24 1000  GLUCAP 255* 91 54* 67* 107*   Lipid Profile: No results for input(s): CHOL, HDL, LDLCALC, TRIG, CHOLHDL, LDLDIRECT in the last 72 hours. Thyroid  Function Tests: No results for input(s): TSH, T4TOTAL, FREET4, T3FREE, THYROIDAB in the last 72  hours. Anemia Panel: No results for input(s): VITAMINB12, FOLATE, FERRITIN, TIBC, IRON, RETICCTPCT in the last 72 hours. Sepsis Labs: Recent Labs  Lab 09/02/24 0257 09/02/24 0300  PROCALCITON <0.10  --   LATICACIDVEN  --  1.1    Recent Results (from the past 240 hours)  Blood Culture (routine x 2)     Status: None (Preliminary result)   Collection Time: 09/02/24  2:40 AM   Specimen: BLOOD RIGHT ARM  Result Value Ref Range Status   Specimen Description BLOOD RIGHT ARM  Final   Special Requests   Final    BOTTLES DRAWN AEROBIC AND ANAEROBIC Blood Culture adequate volume   Culture   Final    NO GROWTH 4 DAYS Performed at [facility], [address], [place], [place] [address]    Report Status PENDING  Incomplete  Blood Culture (routine x 2)     Status: None (Preliminary result)   Collection Time: 09/02/24  4:04 AM   Specimen: BLOOD LEFT ARM  Result Value Ref Range Status   Specimen Description BLOOD LEFT ARM  Final   Special Requests   Final    BOTTLES DRAWN AEROBIC AND ANAEROBIC Blood Culture adequate volume   Culture   Final    NO GROWTH 4 DAYS Performed at [facility], [address] [place], [place] [address]    Report Status PENDING  Incomplete         Radiology Studies: No results found.       Scheduled Meds:  amLODipine   10 mg Oral Daily   atorvastatin   40 mg Oral q AM   doxycycline   100 mg Oral Q12H   enoxaparin  (LOVENOX ) injection  40 mg Subcutaneous Q24H   feeding supplement  237 mL Oral BID BM   guaiFENesin   600 mg Oral BID   insulin  aspart  0-6 Units Subcutaneous Q4H   insulin  glargine  10 Units Subcutaneous QHS   pantoprazole   40 mg Oral Daily   sodium chloride  flush  3 mL Intravenous Q12H   Continuous Infusions:  cefTRIAXone  (ROCEPHIN )  IV 2 g (09/05/24 1756)          Derryl Duval, MD [facility] 09/06/2024, 11:08 AM

## 2024-09-06 NOTE — Progress Notes (Signed)
 Patient CBG 416, MD notified and received order to give scheduled SS insulin  and recheck CBG in an hr. CBG rechecked 387, MD notified and received order to give 8 units. patient remains asymptomatic.

## 2024-09-06 NOTE — Progress Notes (Signed)
 Speech Language Pathology Treatment: Dysphagia  Patient Details Name: Andre Stout MRN: [id number] DOB: Aug 25, 1940 Today's Date: 09/06/2024 Time: 8983-8962 SLP Time Calculation (min) (ACUTE ONLY): 21 min  Assessment / Plan / Recommendation Clinical Impression  ST follow up for skilled therapy to address swallowing goals.  RN approved patient for PO intake and was reporting no obvious issues.  Chart review indicated he has been afebrile, is on room air and lungs are clear/diminished.  He was seen with his breakfast tray. He did not use provale cup for his thin liquids on the breakfast tray.  RN and CNA's were educated on need to use provale cup for fluid intake.  He participated in swallowing therapy completing the following exercises: effortful swallow x 25 and chin tuck against resistance x10 with good effort and accuracy.  No overt s/s of aspiration were seen.  He did struggle to generate a volitional swallow.  ST will continue to follow.     HPI HPI: Andre Stout is an 84 yo male presenting to ED from [facility]3 with AMS and vomiting. CT shows patchy, streaky and ground-glass opacities within the bilateral lung bases. Seen most recently by SLP July 2025 (admitted with bacteremia and sepsis) with AMS resulting in cognitive dysphagia with biggest risk fo aspiration being post prandial given poor positioning, signs of GER, and recent history of vomiting.  PMH includes CHB s/p PPM, cholecystitis with chronic percutaneous cholecystostomy tube, prostate cancer, CKD 3A, prior CVA, HTN, T2DM      SLP Plan  Continue with current plan of care          Recommendations  Diet recommendations: Dysphagia 1 (puree);Thin liquid Liquids provided via:  (provale cup) Medication Administration: Crushed with puree Supervision: Patient able to self feed Compensations: Minimize environmental distractions;Slow rate Postural Changes and/or Swallow Maneuvers: Seated upright 90 degrees;Upright 30-60 min after  meal                              Continue with current plan of care     Eleanor LOISE Eagles  09/06/2024, 10:44 AM

## 2024-09-07 DIAGNOSIS — R112 Nausea with vomiting, unspecified: Secondary | ICD-10-CM | POA: Diagnosis not present

## 2024-09-07 LAB — CULTURE, BLOOD (ROUTINE X 2)
Culture: NO GROWTH
Culture: NO GROWTH
Special Requests: ADEQUATE
Special Requests: ADEQUATE

## 2024-09-07 LAB — GLUCOSE, CAPILLARY
Glucose-Capillary: 154 mg/dL — ABNORMAL HIGH (ref 70–99)
Glucose-Capillary: 148 mg/dL — ABNORMAL HIGH (ref 70–99)
Glucose-Capillary: 182 mg/dL — ABNORMAL HIGH (ref 70–99)
Glucose-Capillary: 328 mg/dL — ABNORMAL HIGH (ref 70–99)
Glucose-Capillary: 300 mg/dL — ABNORMAL HIGH (ref 70–99)
Glucose-Capillary: 263 mg/dL — ABNORMAL HIGH (ref 70–99)

## 2024-09-07 MED ORDER — SODIUM CHLORIDE 0.9 % IV SOLN
2.0000 g | INTRAVENOUS | Status: DC
  Administered 2024-09-07: 2 g via INTRAVENOUS
  Filled 2024-09-07: qty 20

## 2024-09-07 NOTE — Progress Notes (Signed)
 Mobility Specialist: Progress Note   09/07/24 1400  Mobility  Activity Stood at bedside  Level of Assistance Maximum assist, patient does 25-49%  Assistive Device Other (Comment) (HHA)  Activity Response Tolerated well  Mobility Referral Yes  Mobility visit 1 Mobility  Mobility Specialist Start Time (ACUTE ONLY) 1029  Mobility Specialist Stop Time (ACUTE ONLY) 1043  Mobility Specialist Time Calculation (min) (ACUTE ONLY) 14 min    Pt received in bed, agreeable to mobility session. MinA for bed mobility to assist with trunk elevation. MaxA for STS, heavy posterior lean but unable to correct with cues - pt requiring max physical assist to correct. Unable to perform marching or weight shifting exercise. Stood 3x from EOB with no change. Returned pt to EOB, SV for sit>supine. MaxA to slide up. Left in bed with all needs met, call bell in reach.   Andre Stout Mobility Specialist Please contact via SecureChat or Rehab office at [phone]

## 2024-09-07 NOTE — Care Management Important Message (Signed)
 Important Message  Patient Details  Name: Andre Stout MRN: [id number] Date of Birth: [birth year]   Important Message Given:        [name] 09/07/2024, 3:43 PM

## 2024-09-07 NOTE — TOC Progression Note (Signed)
 Transition of Care [facility]) - Progression Note    Patient Details  Name: Andre Stout MRN: [id number] Date of Birth: 27-Mar-1940  Transition of Care [facility]) CM/SW Contact  Sherline Clack, [place] Phone Number: 09/07/2024, 11:39 AM  Clinical Narrative:     Insurance auth still pending.                    Expected Discharge Plan and Services         Expected Discharge Date: 09/07/24                                     Social Drivers of Health (SDOH) Interventions SDOH Screenings   Food Insecurity: Patient Unable To Answer (09/02/2024)  Housing: Unknown (09/02/2024)  Transportation Needs: Patient Unable To Answer (09/02/2024)  Utilities: Patient Unable To Answer (09/02/2024)  Alcohol Screen: Low Risk  (07/24/2023)  Depression (PHQ2-9): Low Risk  (07/24/2023)  Financial Resource Strain: Low Risk  (07/24/2023)  Physical Activity: Inactive (07/24/2023)  Social Connections: Patient Unable To Answer (09/02/2024)  Stress: No Stress Concern Present (07/24/2023)  Tobacco Use: Medium Risk (05/07/2024)  Health Literacy: Adequate Health Literacy (07/24/2023)    Readmission Risk Interventions    08/15/2022    9:25 AM  Readmission Risk Prevention Plan  Transportation Screening Complete  PCP or Specialist Appt within 5-7 Days Complete  Home Care Screening Complete  Medication Review (RN CM) Complete

## 2024-09-07 NOTE — Plan of Care (Signed)
  Problem: Fluid Volume: Goal: Ability to maintain a balanced intake and output will improve Outcome: Progressing   Problem: Skin Integrity: Goal: Risk for impaired skin integrity will decrease Outcome: Progressing   Problem: Pain Managment: Goal: General experience of comfort will improve and/or be controlled Outcome: Progressing   Problem: Safety: Goal: Ability to remain free from injury will improve Outcome: Progressing   Problem: Elimination: Goal: Will not experience complications related to urinary retention Outcome: Not Progressing

## 2024-09-07 NOTE — Plan of Care (Signed)

## 2024-09-08 LAB — GLUCOSE, CAPILLARY
Glucose-Capillary: 193 mg/dL — ABNORMAL HIGH (ref 70–99)
Glucose-Capillary: 226 mg/dL — ABNORMAL HIGH (ref 70–99)
Glucose-Capillary: 339 mg/dL — ABNORMAL HIGH (ref 70–99)
Glucose-Capillary: 50 mg/dL — ABNORMAL LOW (ref 70–99)
Glucose-Capillary: 51 mg/dL — ABNORMAL LOW (ref 70–99)
Glucose-Capillary: 73 mg/dL (ref 70–99)

## 2024-09-08 MED ORDER — INSULIN GLARGINE 100 UNIT/ML ~~LOC~~ SOLN: 10.0000 [IU] | Freq: Every day | SUBCUTANEOUS | 0 refills | Status: DC

## 2024-09-08 MED ORDER — INSULIN ASPART 100 UNIT/ML IJ SOLN: 0.0000 [IU] | INTRAMUSCULAR | 11 refills | Status: DC

## 2024-09-08 NOTE — TOC Progression Note (Signed)
 Transition of Care [facility]) - Progression Note    Patient Details  Name: Andre Stout MRN: [id number] Date of Birth: [birth year]  Transition of Care [facility]) CM/SW Contact  Sherline Clack, [place] Phone Number: 09/08/2024, 9:34 AM  Clinical Narrative:     Insurance auth approved 9/9-9/11, shara ID: [id number]. CSW will notify [facility].                    Expected Discharge Plan and Services         Expected Discharge Date: 09/07/24                                     Social Drivers of Health (SDOH) Interventions SDOH Screenings   Food Insecurity: Patient Unable To Answer (09/02/2024)  Housing: Unknown (09/02/2024)  Transportation Needs: Patient Unable To Answer (09/02/2024)  Utilities: Patient Unable To Answer (09/02/2024)  Alcohol Screen: Low Risk  (07/24/2023)  Depression (PHQ2-9): Low Risk  (07/24/2023)  Financial Resource Strain: Low Risk  (07/24/2023)  Physical Activity: Inactive (07/24/2023)  Social Connections: Patient Unable To Answer (09/02/2024)  Stress: No Stress Concern Present (07/24/2023)  Tobacco Use: Medium Risk (05/07/2024)  Health Literacy: Adequate Health Literacy (07/24/2023)    Readmission Risk Interventions    08/15/2022    9:25 AM  Readmission Risk Prevention Plan  Transportation Screening Complete  PCP or Specialist Appt within 5-7 Days Complete  Home Care Screening Complete  Medication Review (RN CM) Complete

## 2024-09-08 NOTE — TOC Transition Note (Signed)
 Transition of Care [facility]) - Discharge Note   Patient Details  Name: Andre Stout MRN: [id number] Date of Birth: Sep 26, 1940  Transition of Care [facility]) CM/SW Contact:  Sherline Clack, LCSWA Phone Number: 09/08/2024, 12:04 PM   Clinical Narrative:     Patient will DC to:  [facility] Anticipated DC date: 09/08/24 Family notified: Michaelle Jo/daughter Transport by: [place] Approval Dates: 9/9-9/11 Auth ID: [id number]  Per MD patient ready for DC to [facility]. RN to call report prior to discharge [phone] RN, patient, , and facility notified of DC. CSW called patient's daughter/[name] and left a voicemail. Discharge Summary and FL2 sent to facility. DC packet on chart. Ambulance transport requested for patient.   CSW will sign off for now as social work intervention is no longer needed. Please consult us  again if new needs arise.   Final next level of care: Skilled Nursing Facility Barriers to Discharge: Barriers Resolved   Patient Goals and CMS Choice            Discharge Placement              Patient chooses bed at: [facility] Patient to be transferred to facility by: [facility] Name of family member notified: Billie Jo/daughter Patient and family notified of of transfer: 09/08/24  Discharge Plan and Services Additional resources added to the After Visit Summary for                                       Social Drivers of Health (SDOH) Interventions SDOH Screenings   Food Insecurity: Patient Unable To Answer (09/02/2024)  Housing: Unknown (09/02/2024)  Transportation Needs: Patient Unable To Answer (09/02/2024)  Utilities: Patient Unable To Answer (09/02/2024)  Alcohol Screen: Low Risk  (07/24/2023)  Depression (PHQ2-9): Low Risk  (07/24/2023)  Financial Resource Strain: Low Risk  (07/24/2023)  Physical Activity: Inactive (07/24/2023)  Social Connections: Patient Unable To Answer (09/02/2024)  Stress: No Stress Concern Present (07/24/2023)   Tobacco Use: Medium Risk (05/07/2024)  Health Literacy: Adequate Health Literacy (07/24/2023)     Readmission Risk Interventions    08/15/2022    9:25 AM  Readmission Risk Prevention Plan  Transportation Screening Complete  PCP or Specialist Appt within 5-7 Days Complete  Home Care Screening Complete  Medication Review (RN CM) Complete

## 2024-09-08 NOTE — Care Management Important Message (Signed)
 Important Message  Patient Details  Name: Andre Stout MRN: [id number] Date of Birth: [birth year]   Important Message Given:  Yes - Medicare IM     [name] 09/08/2024, 3:23 PM

## 2024-09-08 NOTE — Plan of Care (Signed)

## 2024-09-08 NOTE — Plan of Care (Signed)

## 2024-09-08 NOTE — Progress Notes (Signed)
 Pt for d/c today to [facility]. Report called to [place], nurse.  Will removed IV prior to departure. BS check 50 this AM, recheck after given Ensure, now 73. Ate well for breakfast. Pt resting comfortably. NAD noted. Awaiting [facility] at this time.

## 2024-09-08 NOTE — Progress Notes (Signed)
 Occupational Therapy Treatment Patient Details Name: Andre Stout MRN: [id number] DOB: [birth year] Today's Date: 09/08/2024   History of present illness Pt  is a 84 y.o. male presenting on 9/3 with emesis and AMS. Noted hospitalized from 7/22-31 with sepsis with concern for CAP. Chest x-ray noted patchy bilateral airspace disease with mild pulmonary edema, CT head negative for acute process, CT abdomen with R pleural effusion.  PMH: AV block, hypertension, type 2 diabetes, prostate cancer, glaucoma, HTNm, Cholecystitis s/p cholecystostomy in 07/2022.   OT comments  Patient received in supine and agreeable to OT treatment. Patient able to get to EOB with increased time and supervision. Standing attempted from EOB with RW with patient unable to clear bottom and transfer with RW appeared unsafe.  Transfer to recliner performed with face to face technique with SPT and max assist. Patient able to perform self care tasks and UE HEP seated in recliner. Patient will benefit from continued inpatient follow up therapy, <3 hours/day. Acute OT to continue to follow to address established goals to facilitate DC to next venue of care.        If plan is discharge home, recommend the following:  Two people to help with walking and/or transfers;A lot of help with bathing/dressing/bathroom;Assistance with cooking/housework;Direct supervision/assist for medications management;Direct supervision/assist for financial management;Assist for transportation;Help with stairs or ramp for entrance;Supervision due to cognitive status   Equipment Recommendations  Other (comment) (defer to next venue)    Recommendations for Other Services      Precautions / Restrictions Precautions Precautions: Fall Recall of Precautions/Restrictions: Impaired Restrictions Weight Bearing Restrictions Per Provider Order: No       Mobility Bed Mobility Overal bed mobility: Needs Assistance Bed Mobility: Supine to Sit     Supine to  sit: Supervision, HOB elevated, Used rails     General bed mobility comments: increased time and supervision    Transfers Overall transfer level: Needs assistance Equipment used: None Transfers: Sit to/from Stand, Bed to chair/wheelchair/BSC Sit to Stand: Max assist Stand pivot transfers: Max assist         General transfer comment: attempted to stand to RW with patient having difficulty clearing bottom. Patient able to tolerate transfer to recliner with face to face technique with SPT     Balance Overall balance assessment: Needs assistance Sitting-balance support: Bilateral upper extremity supported, Feet supported Sitting balance-Leahy Scale: Fair Sitting balance - Comments: EOB Postural control: Posterior lean Standing balance support: During functional activity, Reliant on assistive device for balance, Bilateral upper extremity supported Standing balance-Leahy Scale: Poor Standing balance comment: attempted to stand with RW and patient unable to clear bottom, able to stand with therapist support                           ADL either performed or assessed with clinical judgement   ADL Overall ADL's : Needs assistance/impaired Eating/Feeding: Set up;Sitting   Grooming: Wash/dry hands;Wash/dry face;Set up;Sitting       Lower Body Bathing: Moderate assistance;Sit to/from stand;Sitting/lateral leans Lower Body Bathing Details (indicate cue type and reason): knees to feet     Lower Body Dressing: Moderate assistance;Maximal assistance;Sitting/lateral leans;Sit to/from stand Lower Body Dressing Details (indicate cue type and reason): able to doff socks and required mod/max assist to [employer] Transfer: Maximal assistance;Stand-pivot Toilet Transfer Details (indicate cue type and reason): simulated to recliner                Extremity/Trunk Assessment  Vision       Perception     Praxis     Communication  Communication Communication: Impaired Factors Affecting Communication: Hearing impaired   Cognition Arousal: Alert Behavior During Therapy: Flat affect Cognition: Difficult to assess, Cognition impaired Difficult to assess due to: Hard of hearing/deaf Orientation impairments: Time, Situation Awareness: Intellectual awareness impaired, Online awareness impaired Memory impairment (select all impairments): Short-term memory, Working [employer] fast streamer, Non-declarative long-term memory, Geneticist, molecular long-term memory Attention impairment (select first level of impairment): Focused attention Executive functioning impairment (select all impairments): Initiation, Organization, Sequencing, Reasoning, Problem solving OT - Cognition Comments: alert and oriented to self, aware he was in hospital but could not name the hospital                 Following commands: Impaired Following commands impaired: Follows one step commands with increased time, Follows one step commands inconsistently, Follows multi-step commands inconsistently      Cueing   Cueing Techniques: Verbal cues, Gestural cues, Tactile cues, Visual cues  Exercises Exercises: General Upper Extremity General Exercises - Upper Extremity Shoulder Flexion: Strengthening, Both, 10 reps, Seated, Theraband Theraband Level (Shoulder Flexion): Level 1 (Yellow) Shoulder Horizontal ABduction: Strengthening, Both, 10 reps, Seated, Theraband Theraband Level (Shoulder Horizontal Abduction): Level 1 (Yellow) Elbow Flexion: Strengthening, Both, 10 reps, Seated, Theraband Theraband Level (Elbow Flexion): Level 1 (Yellow)    Shoulder Instructions       General Comments      Pertinent Vitals/ Pain       Pain Assessment Pain Assessment: No/denies pain  Home Living                                          Prior Functioning/Environment              Frequency  Min 2X/week        Progress Toward Goals  OT Goals(current  goals can now be found in the care plan section)  Progress towards OT goals: Progressing toward goals  Acute Rehab OT Goals Patient Stated Goal: none given OT Goal Formulation: Patient unable to participate in goal setting Time For Goal Achievement: 09/18/24 Potential to Achieve Goals: Fair ADL Goals Pt Will Perform Lower Body Bathing: sitting/lateral leans;sit to/from stand;with contact guard assist Pt Will Perform Lower Body Dressing: with contact guard assist;sitting/lateral leans;sit to/from stand Pt Will Transfer to Toilet: with contact guard assist;stand pivot transfer;bedside commode Pt Will Perform Toileting - Clothing Manipulation and hygiene: with contact guard assist;sitting/lateral leans;sit to/from stand Pt/caregiver will Perform Home Exercise Program: Increased strength;Both right and left upper extremity;With theraband;With written HEP provided;With minimal assist  Plan      Co-evaluation                 AM-PAC OT 6 Clicks Daily Activity     Outcome Measure   Help from another person eating meals?: A Little Help from another person taking care of personal grooming?: A Little Help from another person toileting, which includes using toliet, bedpan, or urinal?: A Lot Help from another person bathing (including washing, rinsing, drying)?: A Lot Help from another person to put on and taking off regular upper body clothing?: A Little Help from another person to put on and taking off regular lower body clothing?: A Lot 6 Click Score: 15    End of Session Equipment Utilized During Treatment: Gait belt  OT Visit Diagnosis: Unsteadiness  on feet (R26.81);Muscle weakness (generalized) (M62.81);Other symptoms and signs involving cognitive function;Other abnormalities of gait and mobility (R26.89)   Activity Tolerance Patient tolerated treatment well   Patient Left in chair;with call bell/phone within reach;with chair alarm set   Nurse Communication Mobility  status;Need for lift equipment (recommended Stedy to return to bed)        Time: 9052-8987 OT Time Calculation (min): 25 min  Charges: OT General Charges $OT Visit: 1 Visit OT Treatments $Self Care/Home Management : 8-22 mins $Therapeutic Exercise: 8-22 mins  Dick Laine, OTA Acute Rehabilitation Services  Office [phone]   Jeb LITTIE Laine 09/08/2024, 12:26 PM

## 2024-09-22 ENCOUNTER — Ambulatory Visit: Admitting: Internal Medicine

## 2024-09-23 DIAGNOSIS — K219 Gastro-esophageal reflux disease without esophagitis: Secondary | ICD-10-CM | POA: Diagnosis not present

## 2024-09-23 DIAGNOSIS — E119 Type 2 diabetes mellitus without complications: Secondary | ICD-10-CM | POA: Diagnosis not present

## 2024-09-23 DIAGNOSIS — E785 Hyperlipidemia, unspecified: Secondary | ICD-10-CM | POA: Diagnosis not present

## 2024-09-23 DIAGNOSIS — I10 Essential (primary) hypertension: Secondary | ICD-10-CM | POA: Diagnosis not present

## 2024-09-25 ENCOUNTER — Other Ambulatory Visit: Payer: Self-pay

## 2024-10-14 ENCOUNTER — Telehealth: Payer: Self-pay

## 2024-10-14 NOTE — Transitions of Care (Post Inpatient/ED Visit) (Signed)
 10/14/2024  Name: Andre Stout MRN: [id number] DOB: 1940/09/29  Today's TOC FU Call Status: Today's TOC FU Call Status:: Successful TOC FU Call Completed TOC FU Call Complete Date: 10/14/24 Patient's Name and Date of Birth confirmed.  Transition Care Management Follow-up Telephone Call Date of Discharge: 10/13/24 Discharge Facility: Other [facility]) Name of Other ([facility]) Discharge Facility: [facility] Type of Discharge: Inpatient Admission Primary Inpatient Discharge Diagnosis:: subdural hemorrhage How have you been since you were released from the hospital?: Better Any questions or concerns?: No  Items Reviewed: Did you receive and understand the discharge instructions provided?: No Medications obtained,verified, and reconciled?: Yes (Medications Reviewed) Any new allergies since your discharge?: No Dietary orders reviewed?: Yes Do you have support at home?: Yes People in Home [RPT]: child(ren), adult  Medications Reviewed Today: Medications Reviewed Today     Reviewed by Emmitt Pan, LPN (Licensed Practical Nurse) on 10/14/24 at 1252  Med List Status: <None>   Medication Order Taking? Sig Documenting Provider Last Dose Status Informant  acetaminophen  (TYLENOL ) 500 MG tablet [id number] Yes Take 1,000 mg by mouth every 6 (six) hours as needed for mild pain (pain score 1-3) or moderate pain (pain score 4-6). [provider]  Active Nursing Home Medication Administration Guide (MAG)           Med Note (WHITE, DONETA GORMAN Kitchens Oct 15, 2022  2:01 AM)    amLODipine  (NORVASC ) 10 MG tablet [id number] Yes Take 1 tablet (10 mg total) by mouth daily. Jason Leita Repine, FNP  Active Nursing Home Medication Administration Guide (MAG)  amoxicillin -clavulanate (AUGMENTIN ) 875-125 MG tablet [id number] Yes Take 1 tablet by mouth 2 (two) times daily. Sigdel, Santosh, MD  Active   atorvastatin  (LIPITOR) 40 MG tablet [id number] Yes TAKE 1 TABLET BY MOUTH EVERY DAY   Patient taking differently: Take 40 mg by mouth in the morning.   Jason Leita Repine, FNP  Active Nursing Home Medication Administration Guide (MAG)  Azelastine  HCl 137 MCG/SPRAY SOLN [id number] Yes PLACE 1 SPRAY INTO BOTH NOSTRILS 2 (TWO) TIMES DAILY AS NEEDED. NEEDS APPT  Patient taking differently: Place 1 spray into both nostrils 2 (two) times daily as needed (allergies).   Jason Leita Repine, FNP  Active Nursing Home Medication Administration Guide (MAG)  Continuous Blood Gluc Sensor (FREESTYLE LIBRE 3 SENSOR) [place] [id number] Yes 1 each by Does not apply route every 14 (fourteen) days. Trixie File, MD  Active Nursing Home Medication Administration Guide (MAG)  insulin  aspart (NOVOLOG ) 100 UNIT/ML injection [id number] Yes Inject 0-6 Units into the skin every 4 (four) hours. Sigdel, Santosh, MD  Active   insulin  glargine (LANTUS ) 100 UNIT/ML injection [id number] Yes Inject 0.1 mLs (10 Units total) into the skin at bedtime. Sigdel, Santosh, MD  Active   Insulin  Pen Needle 32G X 4 MM MISC [id number] Yes Use to inject insulin  3 times a day Trixie File, MD  Active Nursing Home Medication Administration Guide (MAG)  Insulin  Syringe-Needle U-100 (BD INSULIN  SYRINGE U/F) 31G X 5/16 1 ML MISC [id number] Yes USE 2 DAILY Trixie File, MD  Active Nursing Home Medication Administration Guide (MAG)  magnesium  oxide (MAG-OX) 400 (240 Mg) MG tablet [id number] Yes TAKE 1 TABLET BY MOUTH TWICE A DAY  Patient taking differently: Take 400 mg by mouth 2 (two) times daily.   Jason Leita Repine, FNP  Active Nursing Home Medication Administration Guide (MAG)  metFORMIN  (GLUCOPHAGE ) 500 MG tablet [id number] Yes Take 500 mg by mouth 2 (two) times daily with a  meal. [provider]  Active Nursing Home Medication Administration Guide (MAG)  Multiple Vitamins-Minerals (PRESERVISION AREDS 2) CAPS [id number] Yes Take 1 capsule by mouth every evening. [provider]  Active Nursing Home  Medication Administration Guide (MAG)  omeprazole  (PRILOSEC) 40 MG capsule [id number] Yes Take 40 mg by mouth in the morning. [provider]  Active Nursing Home Medication Administration Guide (MAG)  [facility] DELICA LANCETS 33G [place] [id number] Yes Use to check blood sugar 4 times per day. Dx code: E11.9 Kassie Mallick, MD  Active Nursing Home Medication Administration Guide (MAG)  [facility] ULTRA test strip [id number] Yes USE TO MONITOR GLUCOSE LEVELS 4 TIMES PER DAY E11.9 Kassie Mallick, MD  Active Nursing Home Medication Administration Guide (MAG)  Med List Note Lorne Been, CPhT 09/02/24 1453): [facility] [phone]            Home Care and Equipment/Supplies: Were Home Health Services Ordered?: NA Any new equipment or medical supplies ordered?: NA  Functional Questionnaire: Do you need assistance with bathing/showering or dressing?: Yes Do you need assistance with meal preparation?: Yes Do you need assistance with eating?: No Do you have difficulty maintaining continence: No Do you need assistance with getting out of bed/getting out of a chair/moving?: No Do you have difficulty managing or taking your medications?: Yes  Follow up appointments reviewed: PCP Follow-up appointment confirmed?: Yes Date of PCP follow-up appointment?: 10/20/24 Follow-up Provider: [facility] Follow-up appointment confirmed?: NA Do you need transportation to your follow-up appointment?: No Do you understand care options if your condition(s) worsen?: Yes-patient verbalized understanding    SIGNATURE Julian Lemmings, LPN [facility] Nurse Health Advisor Direct Dial [phone]

## 2024-10-20 ENCOUNTER — Inpatient Hospital Stay: Admitting: Family Medicine

## 2024-10-23 NOTE — Progress Notes (Deleted)
   Acute Office Visit  Subjective:     Patient ID: Andre Stout, male    DOB: [birth year], 84 y.o.   MRN: [id number]  No chief complaint on file.   HPI Patient is in today for hospital FU visit.  Past medical history-dementia, DM  Recent hospitalization Location: Admission date: Discharge date: 9/9- Discharges to SNF CC: Diagnosis:  Aspiration PNA, AKI, hypoglycemia  Abdominal CT negative Checks x-ray noted patchy bilateral opacities, likely aspiration pneumonia.  Patient received ceftriaxone  and doxycycline  in hospital and was discharged on Augmentin .  ED discharge note Lantus  decreased to 15 mg nightly.  Please monitor blood glucose closely Follow-up with PCP in 1 week, recommend laboratory work to ensure stable sodium  ROS      Objective:    There were no vitals taken for this visit. {Vitals History (Optional):23777}  Physical Exam  No results found for any visits on 10/28/24.      Assessment & Plan:   Problem List Items Addressed This Visit   None   No orders of the defined types were placed in this encounter.   No follow-ups on file.  Chasitty Hehl L Sharmayne Jablon, NP

## 2024-10-26 ENCOUNTER — Inpatient Hospital Stay: Admitting: Family Medicine

## 2024-10-27 ENCOUNTER — Telehealth: Admitting: Internal Medicine

## 2024-10-27 ENCOUNTER — Encounter: Payer: Self-pay | Admitting: Internal Medicine

## 2024-10-27 DIAGNOSIS — E7849 Other hyperlipidemia: Secondary | ICD-10-CM

## 2024-10-27 DIAGNOSIS — Z794 Long term (current) use of insulin: Secondary | ICD-10-CM | POA: Diagnosis not present

## 2024-10-27 DIAGNOSIS — E1165 Type 2 diabetes mellitus with hyperglycemia: Secondary | ICD-10-CM

## 2024-10-27 DIAGNOSIS — E1159 Type 2 diabetes mellitus with other circulatory complications: Secondary | ICD-10-CM | POA: Diagnosis not present

## 2024-10-27 MED ORDER — NOVOLOG FLEXPEN 100 UNIT/ML ~~LOC~~ SOPN: 5.0000 [IU] | PEN_INJECTOR | Freq: Three times a day (TID) | SUBCUTANEOUS | 3 refills | Status: DC

## 2024-10-27 MED ORDER — FREESTYLE LIBRE 3 PLUS SENSOR MISC: 1.0000 | 3 refills | Status: DC

## 2024-10-27 MED ORDER — INSULIN PEN NEEDLE 32G X 4 MM MISC: 3 refills | Status: DC

## 2024-10-27 NOTE — Patient Instructions (Addendum)
 Please change: - Lantus  25 units in am   Try to stop 70/30 and start: - Novolog  5-7 units 10-15 min before each meal   Please return in 1.5 months.

## 2024-10-27 NOTE — Progress Notes (Signed)
 Patient ID: Andre Stout, male   DOB: [birth year], 84 y.o.   MRN: [id number]  Patient location: Home My location: Office Persons participating in the virtual visit: patient, provider, daughter and granddaughter  Referring Provider: Jason Leita Repine, FNP (Inactive)  I connected with the patient on 10/27/24 at  1:40 PM EDT by a video enabled telemedicine application and verified that I am speaking with the correct person.   I discussed the limitations of evaluation and management by telemedicine and the availability of in person appointments. The patient expressed understanding and agreed to proceed.   Details of the encounter are shown below.  HPI: Andre Stout is a 84 y.o.-year-old male, returning for follow-up for DM2, dx in 1990, insulin -dependent since 2006, uncontrolled, with complications (CVA, CKD, DR, DKA, hypoglycemia). Pt. previously saw Dr. Kassie, but last visit with me 5.5 months ago.   He is here with his son-in-law now.  He is all of the history as patient is mostly nonverbal.  Interim history: At last visit, son-in-law mentions that they caught the patient on camera during the night eating snacks (sweets). Since last visit, patient was admitted 07/21/2024 with sepsis due to pneumonia.  Also, he was admitted on 09/02/2024 with confusion >> fell >> SDH and aspiration PNA.  Glucose was low, 41.  He is feeling better now.  No complaints today.  Reviewed HbA1c: Lab Results  Component Value Date   HGBA1C 10.7 (H) 07/21/2024   HGBA1C 10.9 (A) 05/07/2024   HGBA1C 10.2 (H) 03/05/2024   HGBA1C 10.7 (A) 11/14/2023   HGBA1C 10.5 07/11/2023   HGBA1C 9.0 (A) 01/22/2023   HGBA1C 8.4 (H) 10/15/2022   HGBA1C 8.3 (H) 08/02/2022   HGBA1C 8.9 (A) 02/07/2022   HGBA1C 8.2 (H) 01/03/2022   He was on: - Novolog  Insulin  70/30 (vials) >> Humalog  75/25 - 10-15 min before meals: Now on: - 30 units before b'fast >> 20 >> 30 units - 20 units before lunch >> - >> 20 >> 0 units - 15  units before dinner >> 20 >> 15 >> 10-15 >> 15-20 units   Now on: - Lantus  15 units 2x a day - Novolog  70/30: 70-150: 0 units 151-200: + 3 units 201-250: + 5 units 251-300: + 7 units  Pt checks his sugars >4x a day with a CGM but at last visit he was checking manually: - am: 140-275 >> 130-150 - 2h after b'fast: n/c - lunch: 100 >> Hi - 2h after lunch: n/c  - dinner: 40s-150 >> 200s - 2h after dinner: see below - bedtime:200s >> 80s   Previously:   Lowest sugar was 75 >> 90s >> 40s >> 41, but lately: 80s; he has hypoglycemia awareness at 70. Highest sugar was 300s >> 300s >> HI >> HI.  Glucometer: One Touch  - + CKD, last BUN/creatinine:  Lab Results  Component Value Date   BUN 15 09/05/2024   BUN 12 09/03/2024   CREATININE 1.12 09/05/2024   CREATININE 1.21 09/03/2024   11/15/2023 ([facility]): Lab Results  Component Value Date   MICRALBCREAT 17 11/15/2023  On lisinopril  20 mg daily.  -+ HL; last set of lipids: Lab Results  Component Value Date   CHOL 115 11/14/2023   HDL 48.10 11/14/2023   LDLCALC 45 11/14/2023   LDLDIRECT 121.0 03/25/2019   TRIG 110.0 11/14/2023   CHOLHDL 2 11/14/2023  On Lipitor 40 mg daily.  - last eye exam was in 2023. + DR. + glaucoma. She has a retina  specialist also (Dr. Alvia).  - no numbness and tingling in his feet.  Last foot exam 05/07/2024 here in clinic.  He also has a history of complete heart block, prostate cancer, acute cholecystitis 08/2022.  ROS: + see HPI  Past Medical History:  Diagnosis Date   Diabetes mellitus without complication (HCC)    Glaucoma    Heart block    complete heart block   Hypertension    Prostate cancer (HCC)    Been 3-4 years ago   Past Surgical History:  Procedure Laterality Date   CATARACT EXTRACTION Bilateral    INSERTION PROSTATE RADIATION SEED     IR EXCHANGE BILIARY DRAIN  08/19/2023   IR EXCHANGE BILIARY DRAIN  12/19/2023   IR EXCHANGE BILIARY DRAIN  01/20/2024   IR PERC  CHOLECYSTOSTOMY  08/17/2022   IR RADIOLOGIST EVAL & MGMT  09/28/2022   IR RADIOLOGIST EVAL & MGMT  10/10/2022   Social History   Socioeconomic History   Marital status: Widowed    Spouse name: Not on file   Number of children: 1   Years of education: 78   Highest education level: Not on file  Occupational History   Occupation: Retired  Tobacco Use   Smoking status: Former    Current packs/day: 0.00    Average packs/day: 0.5 packs/day for 4.0 years (2.0 ttl pk-yrs)    Types: Cigarettes    Start date: 10/29/1970    Quit date: 10/29/1974    Years since quitting: 50.0   Smokeless tobacco: Never  Vaping Use   Vaping status: Never Used  Substance and Sexual Activity   Alcohol use: No   Drug use: No   Sexual activity: Not Currently  Other Topics Concern   Not on file  Social History Narrative   Born and raised in [place], [place]. Currently resides with his daughter (temporary), but he still has a residence.  No live. Fun: hunt and fish   Denies religious beliefs that would effect health care.    Social Drivers of Corporate Investment Banker Strain: Low Risk  (10/26/2024)   Overall Financial Resource Strain (CARDIA)    Difficulty of Paying Living Expenses: Not hard at all  Food Insecurity: No Food Insecurity (10/26/2024)   Hunger Vital Sign    Worried About Running Out of Food in the Last Year: Never true    Ran Out of Food in the Last Year: Never true  Transportation Needs: No Transportation Needs (10/26/2024)   PRAPARE - Administrator, Civil Service (Medical): No    Lack of Transportation (Non-Medical): No  Physical Activity: Inactive (10/26/2024)   Exercise Vital Sign    Days of Exercise per Week: 0 days    Minutes of Exercise per Session: Not on file  Stress: No Stress Concern Present (10/26/2024)   [employer] of Occupational Health - Occupational Stress Questionnaire    Feeling of Stress: Not at all  Social Connections: Socially Isolated (10/26/2024)    Social Connection and Isolation Panel    Frequency of Communication with Friends and Family: Never    Frequency of Social Gatherings with Friends and Family: More than three times a week    Attends Religious Services: Never    Database Administrator or Organizations: No    Attends Banker Meetings: Not on file    Marital Status: Widowed  Intimate Partner Violence: Patient Unable To Answer (09/02/2024)   Humiliation, Afraid, Rape, and Kick questionnaire  Fear of Current or Ex-Partner: Patient unable to answer    Emotionally Abused: Patient unable to answer    Physically Abused: Patient unable to answer    Sexually Abused: Patient unable to answer   Current Outpatient Medications on File Prior to Visit  Medication Sig Dispense Refill   acetaminophen  (TYLENOL ) 500 MG tablet Take 1,000 mg by mouth every 6 (six) hours as needed for mild pain (pain score 1-3) or moderate pain (pain score 4-6).     amLODipine  (NORVASC ) 10 MG tablet Take 1 tablet (10 mg total) by mouth daily. 90 tablet 3   amoxicillin -clavulanate (AUGMENTIN ) 875-125 MG tablet Take 1 tablet by mouth 2 (two) times daily. 4 tablet 0   atorvastatin  (LIPITOR) 40 MG tablet TAKE 1 TABLET BY MOUTH EVERY DAY (Patient taking differently: Take 40 mg by mouth in the morning.) 90 tablet 0   Azelastine  HCl 137 MCG/SPRAY SOLN PLACE 1 SPRAY INTO BOTH NOSTRILS 2 (TWO) TIMES DAILY AS NEEDED. NEEDS APPT (Patient taking differently: Place 1 spray into both nostrils 2 (two) times daily as needed (allergies).) 30 mL 0   Continuous Blood Gluc Sensor (FREESTYLE LIBRE 3 SENSOR) MISC 1 each by Does not apply Stout every 14 (fourteen) days. 6 each 3   insulin  aspart (NOVOLOG ) 100 UNIT/ML injection Inject 0-6 Units into the skin every 4 (four) hours. 10 mL 11   insulin  glargine (LANTUS ) 100 UNIT/ML injection Inject 0.1 mLs (10 Units total) into the skin at bedtime. 10 mL 0   Insulin  Pen Needle 32G X 4 MM MISC Use to inject insulin  3 times a day  300 each 3   Insulin  Syringe-Needle U-100 (BD INSULIN  SYRINGE U/F) 31G X 5/16 1 ML MISC USE 2 DAILY 200 each 4   magnesium  oxide (MAG-OX) 400 (240 Mg) MG tablet TAKE 1 TABLET BY MOUTH TWICE A DAY (Patient taking differently: Take 400 mg by mouth 2 (two) times daily.) 60 tablet 11   metFORMIN  (GLUCOPHAGE ) 500 MG tablet Take 500 mg by mouth 2 (two) times daily with a meal.     Multiple Vitamins-Minerals (PRESERVISION AREDS 2) CAPS Take 1 capsule by mouth every evening.     omeprazole  (PRILOSEC) 40 MG capsule Take 40 mg by mouth in the morning.     ONETOUCH DELICA LANCETS 33G MISC Use to check blood sugar 4 times per day. Dx code: E11.9 200 each 2   ONETOUCH ULTRA test strip USE TO MONITOR GLUCOSE LEVELS 4 TIMES PER DAY E11.9 100 each 2   No current facility-administered medications on file prior to visit.   No Known Allergies Family History  Problem Relation Age of Onset   Other Mother        unknown medical history   Diabetes Father    Diabetes Paternal Grandfather    PE: There were no vitals taken for this visit. Wt Readings from Last 3 Encounters:  09/02/24 160 lb 0.9 oz (72.6 kg)  07/21/24 160 lb (72.6 kg)  05/07/24 160 lb (72.6 kg)   Constitutional:  in NAD  The physical exam was not performed (virtual visit).  ASSESSMENT: 1. 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 HL  PLAN:  1. Patient with longstanding, uncontrolled, type 2 diabetes, on a premixed insulin  regimen, with still poor control.  At last visit, HbA1c was higher, at 10.7%.  Sugars were drastically increased during the night  and staying elevated throughout the day with improvement only after lunch.  He was not eating very well during the day, other than a hefty breakfast, so sugars were dropping before dinner.  During the night, sugars were increasing as he was sneaking snacks.   They were trying to hide the snacks so he could not get them during the night.  My suggestion was to stop the premixed insulin  before lunch since this was a very small meal for him but to take a little bit more insulin  before dinner to avoid an increase in blood sugars overnight. -Unfortunately, we cannot change from the premixed insulin  since his daughter is giving him the injections and it would not be feasible for him to switch to a basal-bolus regimen. -At last visits, we discussed about healthier versions of snacks including unsalted nuts, veggies, or certain fruit with lower glycemic index like berries, apples, pears, etc. -At today's visit  he was off his sensor and just restarted approximately a week ago.  Unfortunately, since the appointment was done virtually, I did not have his receiver to download to be able to see the tracings.  However, per family's report, sugars are good in the morning but they increase significantly as the day goes by, dropping to the 80s at night.  He did not have significant hypoglycemia after he came home from the hospital and family reports that he is eating better.  He is on Lantus  now along with his 70/30 insulin  regimen and we discussed that these are not usually 2 insulins that are used together.  I did recommend to continue Lantus  but groom the entire dose in the morning to avoid hypoglycemia overnight and I also recommended to stop the 70/30 insulin  and switch to NovoLog  only, a prandial dose before meals, without the sliding scale for now.  This was done on purpose so that they can give the mealtime insulin  even with normal blood sugars before meals.  I do want to see the patient back soon to see if we need to escalate the regimen.  I did advise them to increase the dose of NovoLog  if sugars remain high after meals after the above changes. - I suggested to:  Patient Instructions  Please change: - Lantus  25 units in am   Try to stop 70/30 and start: - Novolog  5-7  units 10-15 min before each meal   Please return in 1.5 months.  - we will recheck his HbA1c at next visit - advised to check sugars at different times of the day - 4x a day, rotating check times - advised for yearly eye exams >> he is UTD - return to clinic in 1.5 months  2. HL - Latest lipid panel was at goal: Lab Results  Component Value Date   CHOL 115 11/14/2023   HDL 48.10 11/14/2023   LDLCALC 45 11/14/2023   LDLDIRECT 121.0 03/25/2019   TRIG 110.0 11/14/2023   CHOLHDL 2 11/14/2023  - He continues on Lipitor 40 mg daily without side effects  Lela Fendt, MD PhD  [facility]

## 2024-10-28 ENCOUNTER — Ambulatory Visit: Admitting: Student

## 2024-10-28 ENCOUNTER — Telehealth: Admitting: Student

## 2024-10-28 ENCOUNTER — Encounter: Payer: Self-pay | Admitting: Student

## 2024-10-28 DIAGNOSIS — Z8546 Personal history of malignant neoplasm of prostate: Secondary | ICD-10-CM

## 2024-10-28 DIAGNOSIS — E1169 Type 2 diabetes mellitus with other specified complication: Secondary | ICD-10-CM

## 2024-10-28 DIAGNOSIS — E440 Moderate protein-calorie malnutrition: Secondary | ICD-10-CM

## 2024-10-28 DIAGNOSIS — E11649 Type 2 diabetes mellitus with hypoglycemia without coma: Secondary | ICD-10-CM | POA: Diagnosis not present

## 2024-10-28 DIAGNOSIS — E785 Hyperlipidemia, unspecified: Secondary | ICD-10-CM

## 2024-10-28 DIAGNOSIS — Z794 Long term (current) use of insulin: Secondary | ICD-10-CM

## 2024-10-28 DIAGNOSIS — I10 Essential (primary) hypertension: Secondary | ICD-10-CM

## 2024-10-28 DIAGNOSIS — K219 Gastro-esophageal reflux disease without esophagitis: Secondary | ICD-10-CM

## 2024-10-28 DIAGNOSIS — E1165 Type 2 diabetes mellitus with hyperglycemia: Secondary | ICD-10-CM

## 2024-10-28 DIAGNOSIS — R790 Abnormal level of blood mineral: Secondary | ICD-10-CM

## 2024-10-28 DIAGNOSIS — I152 Hypertension secondary to endocrine disorders: Secondary | ICD-10-CM

## 2024-10-28 DIAGNOSIS — E871 Hypo-osmolality and hyponatremia: Secondary | ICD-10-CM

## 2024-10-28 DIAGNOSIS — E1159 Type 2 diabetes mellitus with other circulatory complications: Secondary | ICD-10-CM

## 2024-10-28 MED ORDER — METFORMIN HCL 500 MG PO TABS: 500.0000 mg | ORAL_TABLET | Freq: Two times a day (BID) | ORAL | 2 refills | Status: AC

## 2024-10-28 MED ORDER — ATORVASTATIN CALCIUM 40 MG PO TABS: 40.0000 mg | ORAL_TABLET | Freq: Every day | ORAL | 1 refills | Status: AC

## 2024-10-28 MED ORDER — OMEPRAZOLE 40 MG PO CPDR: 40.0000 mg | DELAYED_RELEASE_CAPSULE | Freq: Every morning | ORAL | 2 refills | Status: AC

## 2024-10-28 MED ORDER — AMLODIPINE BESYLATE 10 MG PO TABS: 10.0000 mg | ORAL_TABLET | Freq: Every day | ORAL | 3 refills | Status: DC

## 2024-10-28 NOTE — Assessment & Plan Note (Signed)
 Encourage heart healthy diet such as MIND or DASH diet, increase exercise, avoid trans fats, simple carbohydrates and processed foods, consider a krill or fish or flaxseed oil cap daily.  Update lipid panel.

## 2024-10-28 NOTE — Assessment & Plan Note (Signed)
 Managed with omeprazole  40 mg daily. - Refill omeprazole  40 mg daily.

## 2024-10-28 NOTE — Assessment & Plan Note (Signed)
 Well controlled, no changes to meds. Encouraged heart healthy diet such as the DASH diet and exercise as tolerated.

## 2024-10-28 NOTE — Assessment & Plan Note (Signed)
 Recent insulin  regimen change to Lantus  and Novolog . A1c at 10 indicates poor control. Risk of hypoglycemia due to age. Recent hospitalization for aspiration pneumonia likely linked to hyperglycemia-induced vomiting. - Continue Lantus  10 units in the morning. - Continue Novolog  5 units with each meal. - Refill metformin  500 mg twice daily. - Monitor blood glucose levels at home. - Recheck A1c in follow-up. - Ensure adequate protein intake. - Follow up with endocrinology in one month. -Pt to make lab appointment for updated A1C, UACR

## 2024-10-28 NOTE — Assessment & Plan Note (Signed)
 Previously low magnesium  levels, currently on magnesium  400 mg daily - Recheck magnesium  levels with lab work.

## 2024-10-28 NOTE — Assessment & Plan Note (Signed)
 Encourage high-protein, high-calorie meals and snacks such as eggs, yogurt, peanut butter, and smoothies with protein powder or milk. Recommend small, frequent meals throughout the day. Consider nutritional supplements like Ensure or Boost if intake remains low. Monitor weight and appetite at follow-up.

## 2024-10-28 NOTE — Progress Notes (Signed)
 MyChart Video Visit    Virtual Visit via Video Note    Patient location: Home. Patient and provider in visit Provider location: Office  I discussed the limitations of evaluation and management by telemedicine and the availability of in person appointments. The patient expressed understanding and agreed to proceed.  Visit Date: 10/28/2024  Today's healthcare provider: Harlene LITTIE Jolly, NP     Subjective:    Patient ID: Andre Stout, male    DOB: [birth year], 84 y.o.   MRN: [id number]  No chief complaint on file.   HPI  History of Present Illness Andre Stout is a 84 year old male who presents for follow-up video visit after hospitalization for pneumonia in September 2025. Presents with daughter, [name] on [place].  Per Pt daughter has returned to his baseline health status with no shortness of breath or breathing issues since discharge. He uses a walker for mobility and receives physical and occupational therapy at home-He had a stroke previously and had had falls in the home.  His diabetes management includes Lantus  10 units in the morning and Novolog  5 units with each meal- Recently had visit with endocrinology per Pt daughter. His A1c was last recorded at 10.   He takes magnesium  400 mg daily for low magnesium  levels. Blood pressure is managed with amlodipine , with home readings around 130/80 mmHg. He takes atorvastatin  for hyperlipidemia and omeprazole  for heartburn. He has a history of prostate cancer with implants and previously had a gallbladder drain, now removed. He follows up with a podiatrist and ophthalmology.  Past medical history-dementia, DM, CKD,  Insulin  dependent since 2006, follows with Endocrinology  Recent hospitalization Discharge date: 9/9- Discharges to SNF Diagnosis:  Aspiration PNA, AKI, hypoglycemia, hyponatremia Abdominal CT negative Checks x-ray noted patchy bilateral opacities, likely aspiration pneumonia.  Patient received ceftriaxone   and doxycycline  in hospital and was discharged on Augmentin .  Past Medical History:  Diagnosis Date   CKD (chronic kidney disease) stage 3, GFR 30-59 ml/min (HCC)    Diabetes mellitus without complication (HCC)    Glaucoma    Heart block    complete heart block   History of CVA (cerebrovascular accident)    Hypertension    Prostate cancer (HCC)    Been 3-4 years ago    Past Surgical History:  Procedure Laterality Date   CATARACT EXTRACTION Bilateral    INSERTION PROSTATE RADIATION SEED     IR EXCHANGE BILIARY DRAIN  08/19/2023   IR EXCHANGE BILIARY DRAIN  12/19/2023   IR EXCHANGE BILIARY DRAIN  01/20/2024   IR PERC CHOLECYSTOSTOMY  08/17/2022   IR RADIOLOGIST EVAL & MGMT  09/28/2022   IR RADIOLOGIST EVAL & MGMT  10/10/2022    Family History  Problem Relation Age of Onset   Other Mother        unknown medical history   Diabetes Father    Diabetes Paternal Grandfather     Social History   Socioeconomic History   Marital status: Widowed    Spouse name: Not on file   Number of children: 1   Years of education: 82   Highest education level: Not on file  Occupational History   Occupation: Retired  Tobacco Use   Smoking status: Former    Current packs/day: 0.00    Average packs/day: 0.5 packs/day for 4.0 years (2.0 ttl pk-yrs)    Types: Cigarettes    Start date: 10/29/1970    Quit date: 10/29/1974    Years since quitting:  50.0   Smokeless tobacco: Never  Vaping Use   Vaping status: Never Used  Substance and Sexual Activity   Alcohol use: No   Drug use: No   Sexual activity: Not Currently  Other Topics Concern   Not on file  Social History Narrative   Born and raised in [place], [place]. Currently resides with his daughter (temporary), but he still has a residence.  No live. Fun: hunt and fish   Denies religious beliefs that would effect health care.    Social Drivers of Corporate Investment Banker Strain: Low Risk  (10/26/2024)   Overall Financial Resource  Strain (CARDIA)    Difficulty of Paying Living Expenses: Not hard at all  Food Insecurity: No Food Insecurity (10/26/2024)   Hunger Vital Sign    Worried About Running Out of Food in the Last Year: Never true    Ran Out of Food in the Last Year: Never true  Transportation Needs: No Transportation Needs (10/26/2024)   PRAPARE - Administrator, Civil Service (Medical): No    Lack of Transportation (Non-Medical): No  Physical Activity: Inactive (10/26/2024)   Exercise Vital Sign    Days of Exercise per Week: 0 days    Minutes of Exercise per Session: Not on file  Stress: No Stress Concern Present (10/26/2024)   [employer] of Occupational Health - Occupational Stress Questionnaire    Feeling of Stress: Not at all  Social Connections: Socially Isolated (10/26/2024)   Social Connection and Isolation Panel    Frequency of Communication with Friends and Family: Never    Frequency of Social Gatherings with Friends and Family: More than three times a week    Attends Religious Services: Never    Database Administrator or Organizations: No    Attends Banker Meetings: Not on file    Marital Status: Widowed  Intimate Partner Violence: Patient Unable To Answer (09/02/2024)   Humiliation, Afraid, Rape, and Kick questionnaire    Fear of Current or Ex-Partner: Patient unable to answer    Emotionally Abused: Patient unable to answer    Physically Abused: Patient unable to answer    Sexually Abused: Patient unable to answer    Outpatient Medications Prior to Visit  Medication Sig Dispense Refill   acetaminophen  (TYLENOL ) 500 MG tablet Take 1,000 mg by mouth every 6 (six) hours as needed for mild pain (pain score 1-3) or moderate pain (pain score 4-6).     Azelastine  HCl 137 MCG/SPRAY SOLN PLACE 1 SPRAY INTO BOTH NOSTRILS 2 (TWO) TIMES DAILY AS NEEDED. NEEDS APPT (Patient taking differently: Place 1 spray into both nostrils 2 (two) times daily as needed (allergies).) 30  mL 0   Continuous Glucose Sensor (FREESTYLE LIBRE 3 PLUS SENSOR) MISC 1 each by Does not apply route every 14 (fourteen) days. 6 each 3   insulin  aspart (NOVOLOG  FLEXPEN) 100 UNIT/ML FlexPen Inject 5-7 Units into the skin 3 (three) times daily before meals. 30 mL 3   insulin  glargine (LANTUS ) 100 UNIT/ML injection Inject 0.1 mLs (10 Units total) into the skin at bedtime. (Patient taking differently: Inject 15 Units into the skin at bedtime.) 10 mL 0   Insulin  Pen Needle 32G X 4 MM MISC Use to inject insulin  4 times a day 400 each 3   Insulin  Syringe-Needle U-100 (BD INSULIN  SYRINGE U/F) 31G X 5/16 1 ML MISC USE 2 DAILY 200 each 4   magnesium  oxide (MAG-OX) 400 (240 Mg) MG tablet TAKE  1 TABLET BY MOUTH TWICE A DAY (Patient taking differently: Take 400 mg by mouth daily.) 60 tablet 11   Multiple Vitamins-Minerals (PRESERVISION AREDS 2) CAPS Take 1 capsule by mouth every evening.     ONETOUCH DELICA LANCETS 33G MISC Use to check blood sugar 4 times per day. Dx code: E11.9 200 each 2   ONETOUCH ULTRA test strip USE TO MONITOR GLUCOSE LEVELS 4 TIMES PER DAY E11.9 100 each 2   amLODipine  (NORVASC ) 10 MG tablet Take 1 tablet (10 mg total) by mouth daily. 90 tablet 3   atorvastatin  (LIPITOR) 40 MG tablet TAKE 1 TABLET BY MOUTH EVERY DAY (Patient taking differently: Take 40 mg by mouth in the morning.) 90 tablet 0   metFORMIN  (GLUCOPHAGE ) 500 MG tablet Take 500 mg by mouth 2 (two) times daily with a meal.     omeprazole  (PRILOSEC) 40 MG capsule Take 40 mg by mouth in the morning.     No facility-administered medications prior to visit.    No Known Allergies  ROS See HPI    Objective:    Physical Exam  General: Patient appears well-nourished and in no acute distress. Alert and oriented to person and place. Engaged with provider throughout visit  HEENT: Head atraumatic and normocephalic. No facial asymmetry. Pupils appear equal and reactive.  Respiratory: Breathing unlabored. No cough, wheezing,  or respiratory distress observed. Respiratory rate appears normal. Neurological: Patient alert and oriented. Speech coherent. No facial droop, slurred speech, or focal deficits observed. Psychiatric: Mood and affect assessed via video. Patient cooperative. Thought process logical/coherent  There were no vitals taken for this visit. Wt Readings from Last 3 Encounters:  09/02/24 160 lb 0.9 oz (72.6 kg)  07/21/24 160 lb (72.6 kg)  05/07/24 160 lb (72.6 kg)       Assessment & Plan:   Problem List Items Addressed This Visit     GERD without esophagitis   Managed with omeprazole  40 mg daily. - Refill omeprazole  40 mg daily.      Relevant Medications   omeprazole  (PRILOSEC) 40 MG capsule   Hyperlipidemia   Encourage heart healthy diet such as MIND or DASH diet, increase exercise, avoid trans fats, simple carbohydrates and processed foods, consider a krill or fish or flaxseed oil cap daily.  Update lipid panel.      Relevant Medications   amLODipine  (NORVASC ) 10 MG tablet   atorvastatin  (LIPITOR) 40 MG tablet   Hypertension associated with diabetes (HCC)   Well controlled, no changes to meds. Encouraged heart healthy diet such as the DASH diet and exercise as tolerated.        Relevant Medications   amLODipine  (NORVASC ) 10 MG tablet   atorvastatin  (LIPITOR) 40 MG tablet   metFORMIN  (GLUCOPHAGE ) 500 MG tablet   Hyponatremia - Primary   Relevant Orders   Basic Metabolic Panel (BMET)   Low magnesium  level   Previously low magnesium  levels, currently on magnesium  400 mg daily - Recheck magnesium  levels with lab work.       Relevant Orders   Magnesium    Malnutrition of moderate degree   Encourage high-protein, high-calorie meals and snacks such as eggs, yogurt, peanut butter, and smoothies with protein powder or milk. Recommend small, frequent meals throughout the day. Consider nutritional supplements like Ensure or Boost if intake remains low. Monitor weight and appetite at  follow-up.      Uncontrolled diabetes mellitus with hypoglycemia (HCC)   Recent insulin  regimen change to Lantus  and Novolog . A1c at 10  indicates poor control. Risk of hypoglycemia due to age. Recent hospitalization for aspiration pneumonia likely linked to hyperglycemia-induced vomiting. - Continue Lantus  10 units in the morning. - Continue Novolog  5 units with each meal. - Refill metformin  500 mg twice daily. - Monitor blood glucose levels at home. - Recheck A1c in follow-up. - Ensure adequate protein intake. - Follow up with endocrinology in one month. -Pt to make lab appointment for updated A1C, UACR      Relevant Medications   atorvastatin  (LIPITOR) 40 MG tablet   metFORMIN  (GLUCOPHAGE ) 500 MG tablet   Other Relevant Orders   HgB A1c   Urine Microalbumin w/creat. ratio   Other Visit Diagnoses       Hyperlipidemia associated with type 2 diabetes mellitus (HCC)       Relevant Medications   amLODipine  (NORVASC ) 10 MG tablet   atorvastatin  (LIPITOR) 40 MG tablet   metFORMIN  (GLUCOPHAGE ) 500 MG tablet   Other Relevant Orders   Lipid panel     Uncontrolled type 2 diabetes mellitus with hyperglycemia, with long-term current use of insulin  (HCC)       Relevant Medications   atorvastatin  (LIPITOR) 40 MG tablet   metFORMIN  (GLUCOPHAGE ) 500 MG tablet     Primary hypertension       Relevant Medications   amLODipine  (NORVASC ) 10 MG tablet   atorvastatin  (LIPITOR) 40 MG tablet   Other Relevant Orders   CBC with Differential/Platelet     History of prostate cancer       Relevant Orders   PSA       I have changed Kordel R. Wiltsey's atorvastatin , omeprazole , and metFORMIN . I am also having him maintain his OneTouch Delica Lancets 33G, OneTouch Ultra, PreserVision AREDS 2, acetaminophen , Insulin  Syringe-Needle U-100, magnesium  oxide, Azelastine  HCl, insulin  glargine, NovoLOG  FlexPen, Insulin  Pen Needle, FreeStyle Libre 3 Plus Sensor, and amLODipine .  Meds ordered this encounter   Medications   amLODipine  (NORVASC ) 10 MG tablet    Sig: Take 1 tablet (10 mg total) by mouth daily.    Dispense:  90 tablet    Refill:  3    Supervising Provider:   DOMENICA BLACKBIRD A [[id number]]   atorvastatin  (LIPITOR) 40 MG tablet    Sig: Take 1 tablet (40 mg total) by mouth daily.    Dispense:  90 tablet    Refill:  1    Supervising Provider:   DOMENICA BLACKBIRD A [[id number]]   omeprazole  (PRILOSEC) 40 MG capsule    Sig: Take 1 capsule (40 mg total) by mouth in the morning.    Dispense:  90 capsule    Refill:  2    Supervising Provider:   DOMENICA BLACKBIRD A [[id number]]   metFORMIN  (GLUCOPHAGE ) 500 MG tablet    Sig: Take 1 tablet (500 mg total) by mouth 2 (two) times daily with a meal.    Dispense:  180 tablet    Refill:  2    Supervising Provider:   DOMENICA BLACKBIRD A [[id number]]    I discussed the assessment and treatment plan with the patient. The patient was provided an opportunity to ask questions and all were answered. The patient agreed with the plan and demonstrated an understanding of the instructions.   The patient was advised to call back or seek an in-person evaluation if the symptoms worsen or if the condition fails to improve as anticipated.  Follow up in person 4 months  I provided 19 minutes of face-to-face time during this encounter.  Harlene LITTIE Jolly, NP  [facility] [phone] (phone) [phone] (fax)  [facility]

## 2024-11-01 ENCOUNTER — Other Ambulatory Visit: Payer: Self-pay

## 2024-11-01 ENCOUNTER — Emergency Department (HOSPITAL_COMMUNITY)

## 2024-11-01 ENCOUNTER — Inpatient Hospital Stay (HOSPITAL_COMMUNITY)
Admission: EM | Admit: 2024-11-01 | Discharge: 2024-11-06 | DRG: 280 | Disposition: A | Discharge status: Still In Hospital | Attending: Internal Medicine | Admitting: Internal Medicine

## 2024-11-01 ENCOUNTER — Encounter (HOSPITAL_COMMUNITY): Payer: Self-pay

## 2024-11-01 DIAGNOSIS — J90 Pleural effusion, not elsewhere classified: Secondary | ICD-10-CM | POA: Diagnosis not present

## 2024-11-01 DIAGNOSIS — I5043 Acute on chronic combined systolic (congestive) and diastolic (congestive) heart failure: Secondary | ICD-10-CM | POA: Diagnosis present

## 2024-11-01 DIAGNOSIS — I4589 Other specified conduction disorders: Secondary | ICD-10-CM | POA: Diagnosis present

## 2024-11-01 DIAGNOSIS — J9811 Atelectasis: Secondary | ICD-10-CM | POA: Diagnosis present

## 2024-11-01 DIAGNOSIS — I214 Non-ST elevation (NSTEMI) myocardial infarction: Secondary | ICD-10-CM | POA: Diagnosis not present

## 2024-11-01 DIAGNOSIS — R4189 Other symptoms and signs involving cognitive functions and awareness: Secondary | ICD-10-CM | POA: Diagnosis present

## 2024-11-01 DIAGNOSIS — N170 Acute kidney failure with tubular necrosis: Secondary | ICD-10-CM | POA: Diagnosis not present

## 2024-11-01 DIAGNOSIS — I442 Atrioventricular block, complete: Secondary | ICD-10-CM | POA: Diagnosis present

## 2024-11-01 DIAGNOSIS — N183 Chronic kidney disease, stage 3 unspecified: Secondary | ICD-10-CM | POA: Diagnosis present

## 2024-11-01 DIAGNOSIS — E119 Type 2 diabetes mellitus without complications: Secondary | ICD-10-CM | POA: Insufficient documentation

## 2024-11-01 DIAGNOSIS — R197 Diarrhea, unspecified: Secondary | ICD-10-CM | POA: Diagnosis present

## 2024-11-01 DIAGNOSIS — N179 Acute kidney failure, unspecified: Principal | ICD-10-CM | POA: Diagnosis present

## 2024-11-01 DIAGNOSIS — Z794 Long term (current) use of insulin: Secondary | ICD-10-CM | POA: Diagnosis not present

## 2024-11-01 DIAGNOSIS — H409 Unspecified glaucoma: Secondary | ICD-10-CM | POA: Diagnosis present

## 2024-11-01 DIAGNOSIS — D509 Iron deficiency anemia, unspecified: Secondary | ICD-10-CM | POA: Diagnosis present

## 2024-11-01 DIAGNOSIS — R7881 Bacteremia: Secondary | ICD-10-CM | POA: Diagnosis present

## 2024-11-01 DIAGNOSIS — J9601 Acute respiratory failure with hypoxia: Secondary | ICD-10-CM | POA: Diagnosis present

## 2024-11-01 DIAGNOSIS — I509 Heart failure, unspecified: Secondary | ICD-10-CM | POA: Diagnosis present

## 2024-11-01 DIAGNOSIS — E1122 Type 2 diabetes mellitus with diabetic chronic kidney disease: Secondary | ICD-10-CM | POA: Diagnosis present

## 2024-11-01 DIAGNOSIS — E872 Acidosis, unspecified: Secondary | ICD-10-CM | POA: Diagnosis present

## 2024-11-01 DIAGNOSIS — I10 Essential (primary) hypertension: Secondary | ICD-10-CM | POA: Diagnosis not present

## 2024-11-01 DIAGNOSIS — E1165 Type 2 diabetes mellitus with hyperglycemia: Secondary | ICD-10-CM | POA: Diagnosis present

## 2024-11-01 DIAGNOSIS — Z1152 Encounter for screening for COVID-19: Secondary | ICD-10-CM | POA: Diagnosis not present

## 2024-11-01 DIAGNOSIS — D631 Anemia in chronic kidney disease: Secondary | ICD-10-CM | POA: Diagnosis present

## 2024-11-01 DIAGNOSIS — J189 Pneumonia, unspecified organism: Secondary | ICD-10-CM | POA: Diagnosis present

## 2024-11-01 DIAGNOSIS — N178 Other acute kidney failure: Secondary | ICD-10-CM | POA: Diagnosis not present

## 2024-11-01 DIAGNOSIS — Z6831 Body mass index (BMI) 31.0-31.9, adult: Secondary | ICD-10-CM | POA: Diagnosis not present

## 2024-11-01 DIAGNOSIS — I130 Hypertensive heart and chronic kidney disease with heart failure and stage 1 through stage 4 chronic kidney disease, or unspecified chronic kidney disease: Secondary | ICD-10-CM | POA: Diagnosis present

## 2024-11-01 DIAGNOSIS — E785 Hyperlipidemia, unspecified: Secondary | ICD-10-CM | POA: Diagnosis present

## 2024-11-01 DIAGNOSIS — R531 Weakness: Principal | ICD-10-CM

## 2024-11-01 DIAGNOSIS — E440 Moderate protein-calorie malnutrition: Secondary | ICD-10-CM | POA: Diagnosis present

## 2024-11-01 DIAGNOSIS — Z66 Do not resuscitate: Secondary | ICD-10-CM | POA: Diagnosis present

## 2024-11-01 DIAGNOSIS — C61 Malignant neoplasm of prostate: Secondary | ICD-10-CM | POA: Diagnosis present

## 2024-11-01 DIAGNOSIS — I5033 Acute on chronic diastolic (congestive) heart failure: Secondary | ICD-10-CM | POA: Diagnosis not present

## 2024-11-01 DIAGNOSIS — I2510 Atherosclerotic heart disease of native coronary artery without angina pectoris: Secondary | ICD-10-CM | POA: Diagnosis present

## 2024-11-01 DIAGNOSIS — Z7984 Long term (current) use of oral hypoglycemic drugs: Secondary | ICD-10-CM

## 2024-11-01 DIAGNOSIS — Z79899 Other long term (current) drug therapy: Secondary | ICD-10-CM

## 2024-11-01 DIAGNOSIS — E871 Hypo-osmolality and hyponatremia: Secondary | ICD-10-CM | POA: Diagnosis present

## 2024-11-01 DIAGNOSIS — Z833 Family history of diabetes mellitus: Secondary | ICD-10-CM

## 2024-11-01 DIAGNOSIS — E538 Deficiency of other specified B group vitamins: Secondary | ICD-10-CM | POA: Diagnosis present

## 2024-11-01 DIAGNOSIS — R7989 Other specified abnormal findings of blood chemistry: Secondary | ICD-10-CM | POA: Insufficient documentation

## 2024-11-01 DIAGNOSIS — Z8673 Personal history of transient ischemic attack (TIA), and cerebral infarction without residual deficits: Secondary | ICD-10-CM

## 2024-11-01 DIAGNOSIS — Z87891 Personal history of nicotine dependence: Secondary | ICD-10-CM

## 2024-11-01 DIAGNOSIS — Z8546 Personal history of malignant neoplasm of prostate: Secondary | ICD-10-CM

## 2024-11-01 LAB — URINALYSIS, W/ REFLEX TO CULTURE (INFECTION SUSPECTED)
Bilirubin Urine: NEGATIVE
Glucose, UA: 150 mg/dL — AB
Hgb urine dipstick: NEGATIVE
Ketones, ur: NEGATIVE mg/dL
Nitrite: NEGATIVE
Protein, ur: 30 mg/dL — AB
Specific Gravity, Urine: 1.015 (ref 1.005–1.030)
pH: 5 (ref 5.0–8.0)

## 2024-11-01 LAB — RESP PANEL BY RT-PCR (RSV, FLU A&B, COVID)  RVPGX2
Influenza A by PCR: NEGATIVE
Influenza B by PCR: NEGATIVE
Resp Syncytial Virus by PCR: NEGATIVE
SARS Coronavirus 2 by RT PCR: NEGATIVE

## 2024-11-01 LAB — CBC
HCT: 31.2 % — ABNORMAL LOW (ref 39.0–52.0)
Hemoglobin: 9.8 g/dL — ABNORMAL LOW (ref 13.0–17.0)
MCH: 25 pg — ABNORMAL LOW (ref 26.0–34.0)
MCHC: 31.4 g/dL (ref 30.0–36.0)
MCV: 79.6 fL — ABNORMAL LOW (ref 80.0–100.0)
Platelets: 367 K/uL (ref 150–400)
RBC: 3.92 MIL/uL — ABNORMAL LOW (ref 4.22–5.81)
RDW: 16.8 % — ABNORMAL HIGH (ref 11.5–15.5)
WBC: 7.7 K/uL (ref 4.0–10.5)
nRBC: 0 % (ref 0.0–0.2)

## 2024-11-01 LAB — CBC WITH DIFFERENTIAL/PLATELET
Abs Immature Granulocytes: 0.03 K/uL (ref 0.00–0.07)
Basophils Absolute: 0 K/uL (ref 0.0–0.1)
Basophils Relative: 0 %
Eosinophils Absolute: 0.1 K/uL (ref 0.0–0.5)
Eosinophils Relative: 1 %
HCT: 34.3 % — ABNORMAL LOW (ref 39.0–52.0)
Hemoglobin: 10.7 g/dL — ABNORMAL LOW (ref 13.0–17.0)
Immature Granulocytes: 0 %
Lymphocytes Relative: 14 %
Lymphs Abs: 1.1 K/uL (ref 0.7–4.0)
MCH: 25 pg — ABNORMAL LOW (ref 26.0–34.0)
MCHC: 31.2 g/dL (ref 30.0–36.0)
MCV: 80.1 fL (ref 80.0–100.0)
Monocytes Absolute: 0.5 K/uL (ref 0.1–1.0)
Monocytes Relative: 6 %
Neutro Abs: 6.2 K/uL (ref 1.7–7.7)
Neutrophils Relative %: 79 %
Platelets: 397 K/uL (ref 150–400)
RBC: 4.28 MIL/uL (ref 4.22–5.81)
RDW: 16.7 % — ABNORMAL HIGH (ref 11.5–15.5)
WBC: 8 K/uL (ref 4.0–10.5)
nRBC: 0 % (ref 0.0–0.2)

## 2024-11-01 LAB — I-STAT CG4 LACTIC ACID, ED
Lactic Acid, Venous: 3.6 mmol/L (ref 0.5–1.9)
Lactic Acid, Venous: 2.9 mmol/L (ref 0.5–1.9)

## 2024-11-01 LAB — BASIC METABOLIC PANEL WITH GFR
Anion gap: 13 (ref 5–15)
BUN: 48 mg/dL — ABNORMAL HIGH (ref 8–23)
CO2: 19 mmol/L — ABNORMAL LOW (ref 22–32)
Calcium: 8.2 mg/dL — ABNORMAL LOW (ref 8.9–10.3)
Chloride: 96 mmol/L — ABNORMAL LOW (ref 98–111)
Creatinine, Ser: 2.26 mg/dL — ABNORMAL HIGH (ref 0.61–1.24)
GFR, Estimated: 28 mL/min — ABNORMAL LOW (ref >60)
Glucose, Bld: 288 mg/dL — ABNORMAL HIGH (ref 70–99)
Potassium: 4.8 mmol/L (ref 3.5–5.1)
Sodium: 128 mmol/L — ABNORMAL LOW (ref 135–145)

## 2024-11-01 LAB — COMPREHENSIVE METABOLIC PANEL WITH GFR
ALT: 12 U/L (ref 0–44)
AST: 15 U/L (ref 15–41)
Albumin: 2.9 g/dL — ABNORMAL LOW (ref 3.5–5.0)
Alkaline Phosphatase: 78 U/L (ref 38–126)
Anion gap: 13 (ref 5–15)
BUN: 50 mg/dL — ABNORMAL HIGH (ref 8–23)
CO2: 20 mmol/L — ABNORMAL LOW (ref 22–32)
Calcium: 8.8 mg/dL — ABNORMAL LOW (ref 8.9–10.3)
Chloride: 94 mmol/L — ABNORMAL LOW (ref 98–111)
Creatinine, Ser: 2.29 mg/dL — ABNORMAL HIGH (ref 0.61–1.24)
GFR, Estimated: 28 mL/min — ABNORMAL LOW (ref >60)
Glucose, Bld: 350 mg/dL — ABNORMAL HIGH (ref 70–99)
Potassium: 5.4 mmol/L — ABNORMAL HIGH (ref 3.5–5.1)
Sodium: 127 mmol/L — ABNORMAL LOW (ref 135–145)
Total Bilirubin: 0.9 mg/dL (ref 0.0–1.2)
Total Protein: 6.9 g/dL (ref 6.5–8.1)

## 2024-11-01 LAB — I-STAT VENOUS BLOOD GAS, ED
Acid-base deficit: 4 mmol/L — ABNORMAL HIGH (ref 0.0–2.0)
Bicarbonate: 20.8 mmol/L (ref 20.0–28.0)
Calcium, Ion: 1.12 mmol/L — ABNORMAL LOW (ref 1.15–1.40)
HCT: 38 % — ABNORMAL LOW (ref 39.0–52.0)
Hemoglobin: 12.9 g/dL — ABNORMAL LOW (ref 13.0–17.0)
O2 Saturation: 94 %
Potassium: 5.4 mmol/L — ABNORMAL HIGH (ref 3.5–5.1)
Sodium: 128 mmol/L — ABNORMAL LOW (ref 135–145)
TCO2: 22 mmol/L (ref 22–32)
pCO2, Ven: 34.3 mmHg — ABNORMAL LOW (ref 44–60)
pH, Ven: 7.39 (ref 7.25–7.43)
pO2, Ven: 70 mmHg — ABNORMAL HIGH (ref 32–45)

## 2024-11-01 LAB — TROPONIN I (HIGH SENSITIVITY)
Troponin I (High Sensitivity): 481 ng/L (ref <18)
Troponin I (High Sensitivity): 442 ng/L (ref <18)

## 2024-11-01 LAB — PROTIME-INR
INR: 1.2 (ref 0.8–1.2)
Prothrombin Time: 15.4 s — ABNORMAL HIGH (ref 11.4–15.2)

## 2024-11-01 LAB — BRAIN NATRIURETIC PEPTIDE: B Natriuretic Peptide: 2276 pg/mL — ABNORMAL HIGH (ref 0.0–100.0)

## 2024-11-01 LAB — CBG MONITORING, ED
Glucose-Capillary: 308 mg/dL — ABNORMAL HIGH (ref 70–99)
Glucose-Capillary: 261 mg/dL — ABNORMAL HIGH (ref 70–99)

## 2024-11-01 LAB — MAGNESIUM: Magnesium: 2.2 mg/dL (ref 1.7–2.4)

## 2024-11-01 MED ORDER — INSULIN ASPART 100 UNIT/ML IJ SOLN
0.0000 [IU] | INTRAMUSCULAR | Status: DC
  Administered 2024-11-01: 3 [IU] via SUBCUTANEOUS
  Administered 2024-11-02: 1 [IU] via SUBCUTANEOUS

## 2024-11-01 MED ORDER — MAGNESIUM OXIDE -MG SUPPLEMENT 400 (240 MG) MG PO TABS
400.0000 mg | ORAL_TABLET | Freq: Every day | ORAL | Status: DC
  Administered 2024-11-02 – 2024-11-04 (×3): 400 mg via ORAL
  Filled 2024-11-01 (×3): qty 1

## 2024-11-01 MED ORDER — PANTOPRAZOLE SODIUM 40 MG PO TBEC
40.0000 mg | DELAYED_RELEASE_TABLET | Freq: Every day | ORAL | Status: DC
  Administered 2024-11-02 – 2024-11-06 (×5): 40 mg via ORAL
  Filled 2024-11-01 (×5): qty 1

## 2024-11-01 MED ORDER — ACETAMINOPHEN 325 MG PO TABS: 650.0000 mg | ORAL_TABLET | Freq: Four times a day (QID) | ORAL | Status: DC | PRN

## 2024-11-01 MED ORDER — AMLODIPINE BESYLATE 10 MG PO TABS
10.0000 mg | ORAL_TABLET | Freq: Every day | ORAL | Status: DC
  Administered 2024-11-02 – 2024-11-06 (×5): 10 mg via ORAL
  Filled 2024-11-01: qty 1
  Filled 2024-11-01: qty 2
  Filled 2024-11-01 (×3): qty 1

## 2024-11-01 MED ORDER — DOXYCYCLINE HYCLATE 100 MG PO TABS
100.0000 mg | ORAL_TABLET | Freq: Once | ORAL | Status: AC
  Administered 2024-11-01: 100 mg via ORAL
  Filled 2024-11-01: qty 1

## 2024-11-01 MED ORDER — SODIUM CHLORIDE 0.9 % IV BOLUS
1000.0000 mL | Freq: Once | INTRAVENOUS | Status: AC
  Administered 2024-11-01: 1000 mL via INTRAVENOUS

## 2024-11-01 MED ORDER — HEPARIN SODIUM (PORCINE) 5000 UNIT/ML IJ SOLN
5000.0000 [IU] | Freq: Three times a day (TID) | INTRAMUSCULAR | Status: DC
  Administered 2024-11-01 – 2024-11-06 (×14): 5000 [IU] via SUBCUTANEOUS
  Filled 2024-11-01 (×14): qty 1

## 2024-11-01 MED ORDER — ACETAMINOPHEN 650 MG RE SUPP: 650.0000 mg | Freq: Four times a day (QID) | RECTAL | Status: DC | PRN

## 2024-11-01 MED ORDER — ATORVASTATIN CALCIUM 40 MG PO TABS
40.0000 mg | ORAL_TABLET | Freq: Every day | ORAL | Status: DC
  Administered 2024-11-02 – 2024-11-06 (×5): 40 mg via ORAL
  Filled 2024-11-01 (×5): qty 1

## 2024-11-01 MED ORDER — ASPIRIN 81 MG PO TBEC
81.0000 mg | DELAYED_RELEASE_TABLET | Freq: Every day | ORAL | Status: DC
  Administered 2024-11-01 – 2024-11-06 (×6): 81 mg via ORAL
  Filled 2024-11-01 (×6): qty 1

## 2024-11-01 MED ORDER — SODIUM CHLORIDE 0.9 % IV SOLN
1.0000 g | Freq: Once | INTRAVENOUS | Status: AC
  Administered 2024-11-01: 1 g via INTRAVENOUS
  Filled 2024-11-01: qty 10

## 2024-11-01 NOTE — ED Triage Notes (Signed)
 Patient here from home. BIB [facility] with AMS x2 days.  EMS reports increased urination and lung sounds clear. Abdomen is hard and distended. He has not eaten or drank anything in 3 days  PMH: CHF, brain tumor, DM 2

## 2024-11-01 NOTE — Consult Note (Signed)
 Cardiology Consult Note:   Patient ID: ZENON LEAF MRN: [id number]; DOB: 07-17-1940   Admission date: 11/01/2024  Primary Care Provider: Georgina Leita Norris, DO Primary Cardiologist: Darryle ONEIDA Decent, MD  Primary Electrophysiologist:  Danelle Birmingham, MD   Chief Complaint:  Altered mental status   Patient Profile:   LAVERT MATOUSEK is a 84 y.o. 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 Raczkowski and was provided to cardiology by the emergency room physician.  According to the ED, Mr. Desa has not eaten or drink anything in the last few days and was found with an altered mental status.  He has a history of DKA and so he was brought to the emergency department to ascertain if that was the cause of his current symptoms.  He lives with his sister.  Mr. Vallee is not able to answer any questions or state why he is here.  On arrival to the ED he was hemodynamically stable with a blood pressure of 107/68 and heart rate of 68 bpm.  BMP with a sodium of 127, potassium of 5.4, glucose of 350, creatinine of 2.29 (baseline ~ 1.0) lactic acid 3.6. troponin 481-442.  WBC of 7.7, hemoglobin 9.8, platelet 367. UA negative for ketones. CT of chest abdomen pelvis with large bilateral pleural effusion, scattered ground glass opacity concerning for edema. CT head negative for acute process.  Past Medical History:  Diagnosis Date   CKD (chronic kidney disease) stage 3, GFR 30-59 ml/min (HCC)    Diabetes mellitus without complication (HCC)    Glaucoma    Heart block    complete heart block   History of CVA (cerebrovascular accident)    Hypertension    Prostate cancer (HCC)    Been 3-4 years ago    Past Surgical History:  Procedure Laterality Date   CATARACT EXTRACTION Bilateral    INSERTION PROSTATE RADIATION SEED     IR EXCHANGE BILIARY DRAIN  08/19/2023   IR EXCHANGE BILIARY DRAIN   12/19/2023   IR EXCHANGE BILIARY DRAIN  01/20/2024   IR PERC CHOLECYSTOSTOMY  08/17/2022   IR RADIOLOGIST EVAL & MGMT  09/28/2022   IR RADIOLOGIST EVAL & MGMT  10/10/2022     Medications Prior to Admission: Prior to Admission medications   Medication Sig Start Date End Date Taking? Authorizing Provider  acetaminophen  (TYLENOL ) 500 MG tablet Take 1,000 mg by mouth every 6 (six) hours as needed for mild pain (pain score 1-3) or moderate pain (pain score 4-6).   Yes [provider]  amLODipine  (NORVASC ) 10 MG tablet Take 1 tablet (10 mg total) by mouth daily. 10/28/24  Yes Wheeler Harlene CROME, NP  atorvastatin  (LIPITOR) 40 MG tablet Take 1 tablet (40 mg total) by mouth daily. 10/28/24  Yes Yacopino, Jessica L, NP  Azelastine  HCl 137 MCG/SPRAY SOLN PLACE 1 SPRAY INTO BOTH NOSTRILS 2 (TWO) TIMES DAILY AS NEEDED. NEEDS APPT Patient taking differently: Place 1 spray into both nostrils 2 (two) times daily as needed (allergies). 07/13/24  Yes Jason Leita Repine, FNP  LANTUS  SOLOSTAR 100 UNIT/ML Solostar Pen Inject 15 Units into the skin daily. 10/15/24  Yes [provider]  magnesium  oxide (MAG-OX) 400 (240 Mg) MG tablet TAKE 1 TABLET BY MOUTH TWICE A DAY Patient taking differently: Take 400 mg by mouth daily. 02/28/24  Yes Jason Leita Repine, FNP  metFORMIN  (GLUCOPHAGE ) 500 MG tablet Take  1 tablet (500 mg total) by mouth 2 (two) times daily with a meal. 10/28/24  Yes Wheeler Harlene CROME, NP  Multiple Vitamins-Minerals (PRESERVISION AREDS 2) CAPS Take 1 capsule by mouth every evening.   Yes [provider]  NOVOLOG  MIX 70/30 FLEXPEN (70-30) 100 UNIT/ML FlexPen Inject 5 Units into the skin 3 (three) times daily. 10/15/24  Yes [provider]  omeprazole  (PRILOSEC) 40 MG capsule Take 1 capsule (40 mg total) by mouth in the morning. 10/28/24  Yes Yacopino, Jessica L, NP  Continuous Glucose Sensor (FREESTYLE LIBRE 3 PLUS SENSOR) MISC 1 each by Does not apply route  every 14 (fourteen) days. 10/27/24   Trixie File, MD  insulin  aspart (NOVOLOG  FLEXPEN) 100 UNIT/ML FlexPen Inject 5-7 Units into the skin 3 (three) times daily before meals. Patient not taking: Reported on 11/01/2024 10/27/24   Trixie File, MD  Insulin  Pen Needle 32G X 4 MM MISC Use to inject insulin  4 times a day 10/27/24   Trixie File, MD  Insulin  Syringe-Needle U-100 (BD INSULIN  SYRINGE U/F) 31G X 5/16 1 ML MISC USE 2 DAILY 11/21/22   Trixie File, MD  [facility] DELICA LANCETS 33G MISC Use to check blood sugar 4 times per day. Dx code: E11.9 03/27/16   Kassie Mallick, MD  [facility] ULTRA test strip USE TO MONITOR GLUCOSE LEVELS 4 TIMES PER DAY E11.9 06/10/19   Kassie Mallick, MD     Allergies:   No Known Allergies  Social History:   Social History   Socioeconomic History   Marital status: Widowed    Spouse name: Not on file   Number of children: 1   Years of education: 79   Highest education level: Not on file  Occupational History   Occupation: Retired  Tobacco Use   Smoking status: Former    Current packs/day: 0.00    Average packs/day: 0.5 packs/day for 4.0 years (2.0 ttl pk-yrs)    Types: Cigarettes    Start date: 10/29/1970    Quit date: 10/29/1974    Years since quitting: 50.0   Smokeless tobacco: Never  Vaping Use   Vaping status: Never Used  Substance and Sexual Activity   Alcohol use: No   Drug use: No   Sexual activity: Not Currently  Other Topics Concern   Not on file  Social History Narrative   Born and raised in [place], [place]. Currently resides with his daughter (temporary), but he still has a residence.  No live. Fun: hunt and fish   Denies religious beliefs that would effect health care.    Social Drivers of Corporate Investment Banker Strain: Low Risk  (10/26/2024)   Overall Financial Resource Strain (CARDIA)    Difficulty of Paying Living Expenses: Not hard at all  Food Insecurity: No Food Insecurity (10/26/2024)   Hunger Vital  Sign    Worried About Running Out of Food in the Last Year: Never true    Ran Out of Food in the Last Year: Never true  Transportation Needs: No Transportation Needs (10/26/2024)   PRAPARE - Administrator, Civil Service (Medical): No    Lack of Transportation (Non-Medical): No  Physical Activity: Inactive (10/26/2024)   Exercise Vital Sign    Days of Exercise per Week: 0 days    Minutes of Exercise per Session: Not on file  Stress: No Stress Concern Present (10/26/2024)   [employer] of Occupational Health - Occupational Stress Questionnaire    Feeling of Stress: Not at all  Social Connections: Socially Isolated (10/26/2024)   Social Connection and Isolation Panel    Frequency of Communication with Friends and Family: Never    Frequency of Social Gatherings with Friends and Family: More than three times a week    Attends Religious Services: Never    Database Administrator or Organizations: No    Attends Banker Meetings: Not on file    Marital Status: Widowed  Intimate Partner Violence: Patient Unable To Answer (09/02/2024)   Humiliation, Afraid, Rape, and Kick questionnaire    Fear of Current or Ex-Partner: Patient unable to answer    Emotionally Abused: Patient unable to answer    Physically Abused: Patient unable to answer    Sexually Abused: Patient unable to answer    Family History:   The patient's family history includes Diabetes in his father and paternal grandfather; Other in his mother.    Review of Systems: Unable to obtain  Physical Exam/Data:   Vitals:   11/01/24 2115 11/01/24 2130 11/01/24 2145 11/01/24 2147  BP: 101/74 115/81 124/74   Pulse: 65 66 69   Resp: 16 20 19    Temp:    (!) 97.5 F (36.4 C)  TempSrc:    Oral  SpO2: 96% 98% 98%   Weight:      Height:       No intake or output data in the 24 hours ending 11/01/24 2354 Filed Weights   11/01/24 1745  Weight: 72 kg   Body mass index is 31 kg/m.  General: Laying flat  on the bed with respirations around 20 HEENT: normal, diagonal ear sign present Neck: no JVD Cardiac: Normal rate and rhythm Lungs: Diminished breath sounds bilaterally with slightly labored respirations Abd: soft, nontender  Ext: Bilateral nonpitting edema with left> right Musculoskeletal:  No deformities Skin: warm and dry  Psych:  Normal affect    EKG:  EKG obtained on November 2 at 1757.  Complete heart block with narrow junctional escape at a rate of 68 bpm  Relevant CV Studies: ECHO 01/09/2024 1. Left ventricular ejection fraction, by estimation, is 65 to 70%. The  left ventricle has normal function. The left ventricle has no regional  wall motion abnormalities. There is mild left ventricular hypertrophy.  Indeterminate diastolic filling due to  E-A fusion.   2. Right ventricular systolic function is hyperdynamic. The right  ventricular size is normal.   3. Left atrial size was moderately dilated.   4. The mitral valve is normal in structure. Trivial mitral valve  regurgitation. No evidence of mitral stenosis.   5. The aortic valve is tricuspid. There is mild thickening of the aortic  valve. Aortic valve regurgitation is not visualized. Aortic valve  sclerosis is present, with no evidence of aortic valve stenosis.   6. The inferior vena cava is normal in size with greater than 50%  respiratory variability, suggesting right atrial pressure of 3 mmHg.    Laboratory Data: Component     Latest Ref Rng 11/01/2024  Sodium     135 - 145 mmol/L 128 (L)   Potassium     3.5 - 5.1 mmol/L 4.8   Chloride     98 - 111 mmol/L 96 (L)   CO2     22 - 32 mmol/L 19 (L)   Glucose     70 - 99 mg/dL 711 (H)   BUN     8 - 23 mg/dL 48 (H)   Creatinine     0.61 -  1.24 mg/dL 7.73 (H)   Calcium      8.9 - 10.3 mg/dL 8.2 (L)   GFR, Estimated     >60 mL/min 28 (L)   Anion gap     5 - 15  13     Component     Latest Ref Rng 11/01/2024  WBC     4.0 - 10.5 K/uL 7.7   RBC     4.22 - 5.81  MIL/uL 3.92 (L)   Hemoglobin     13.0 - 17.0 g/dL 9.8 (L)   HCT     60.9 - 52.0 % 31.2 (L)   MCV     80.0 - 100.0 fL 79.6 (L)   MCH     26.0 - 34.0 pg 25.0 (L)   MCHC     30.0 - 36.0 g/dL 68.5   RDW     88.4 - 84.4 % 16.8 (H)   Platelets     150 - 400 K/uL 367   nRBC     0.0 - 0.2 % 0.0     Component     Latest Ref Rng 11/01/2024  Troponin I (High Sensitivity)     <18 ng/L 442 (HH)   Troponin I (High Sensitivity)      481 (HH)     Legend: (HH) High Panic  Radiology/Studies:  CT CHEST ABDOMEN PELVIS WO CONTRAST Result Date: 11/01/2024 EXAM: CT CHEST, ABDOMEN AND PELVIS WITHOUT CONTRAST 11/01/2024 08:09:41 PM TECHNIQUE: CT of the chest, abdomen and pelvis was performed without the administration of intravenous contrast. Multiplanar reformatted images are provided for review. Automated exposure control, iterative reconstruction, and/or weight based adjustment of the mA/kV was utilized to reduce the radiation dose to as low as reasonably achievable. COMPARISON: 09/02/2024 CLINICAL HISTORY: Sepsis. FINDINGS: CHEST: MEDIASTINUM AND LYMPH NODES: Mild cardiomegaly. Coronary artery and aortic atherosclerosis. The central airways are clear. No mediastinal, hilar or axillary lymphadenopathy. LUNGS AND PLEURA: Moderate to large bilateral pleural effusions. Compressive atelectasis in the lower lobes bilaterally. Scattered ground glass opacities throughout the lungs may reflect early edema. No pneumothorax. ABDOMEN AND PELVIS: LIVER: The liver is unremarkable. GALLBLADDER AND BILE DUCTS: Small layering gallstones within the gallbladder. No biliary ductal dilatation. SPLEEN: No acute abnormality. PANCREAS: No acute abnormality. ADRENAL GLANDS: No acute abnormality. KIDNEYS, URETERS AND BLADDER: Stable 11 cm cyst in the mid to lower pole of the right kidney. No stones in the kidneys or ureters. No hydronephrosis. No perinephric or periureteral stranding. Urinary bladder is unremarkable. GI AND BOWEL:  Stomach demonstrates no acute abnormality. There is no bowel obstruction. REPRODUCTIVE ORGANS: Radiation seeds in the region of the prostate. PERITONEUM AND RETROPERITONEUM: No ascites. No free air. VASCULATURE: Aorta is normal in caliber. Aortic atherosclerosis. ABDOMINAL AND PELVIS LYMPH NODES: No lymphadenopathy. BONES AND SOFT TISSUES: Severe chronic compression fracture at L1, unchanged. No focal soft tissue abnormality. IMPRESSION: 1. Moderate to large bilateral pleural effusions with compressive atelectasis in the lower lobes bilaterally. 2. Scattered ground-glass opacities throughout the lungs, possibly reflecting early edema. 3. Mild cardiomegaly. 4. Cholelithiasis. 5. No acute findings in the abdomen or pelvis. Electronically signed by: Franky Crease MD 11/01/2024 08:18 PM EST RP Workstation: [id number]   CT Head Wo Contrast Result Date: 11/01/2024 EXAM: CT HEAD WITHOUT CONTRAST 11/01/2024 08:09:41 PM TECHNIQUE: CT of the head was performed without the administration of intravenous contrast. Automated exposure control, iterative reconstruction, and/or weight based adjustment of the mA/kV was utilized to reduce the radiation dose to as low as reasonably achievable.  COMPARISON: CT 09/02/2024 CLINICAL HISTORY: Mental status change, unknown cause. FINDINGS: BRAIN AND VENTRICLES: No acute hemorrhage. No evidence of acute infarct. No hydrocephalus. No extra-axial collection. No mass effect or midline shift. Diffuse cerebral and cerebellar atrophy. Patchy and confluent areas of decreased attenuation are noted throughout the deep and periventricular white matter of the cerebral hemispheres bilaterally, suggestive of chronic microvascular ischemic changes. Atherosclerotic calcifications are present within the cavernous internal carotid and vertebral arteries. ORBITS: No acute abnormality. Bilateral lens replacement. SINUSES: No acute abnormality. SOFT TISSUES AND SKULL: Trace right posterior scalp hematoma (7.36).  No skull fracture. IMPRESSION: 1. No acute intracranial abnormality. Electronically signed by: Morgane Naveau MD 11/01/2024 08:16 PM EST RP Workstation: [id number]   DG Chest Port 1 View Result Date: 11/01/2024 EXAM: 1 VIEW(S) XRAY OF THE CHEST 11/01/2024 06:40:00 PM COMPARISON: 09/02/2024 CLINICAL HISTORY: Questionable sepsis - evaluate for abnormality. FINDINGS: LUNGS AND PLEURA: Consolidation in the left lower lobe concerning for pneumonia. Possible small left effusion. No pulmonary edema. No pneumothorax. HEART AND MEDIASTINUM: Heart is borderline in size. Aortic atherosclerosis. BONES AND SOFT TISSUES: No acute osseous abnormality. IMPRESSION: 1. Left lower lobe consolidation concerning for pneumonia. 2. Possible small left pleural effusion. Electronically signed by: Franky Crease MD 11/01/2024 06:50 PM EST RP Workstation: [id number]    Assessment and Plan:  Mr. Nannini is an 84 year old male who presents to the emergency department for altered mental status for the last 2 days. Cardiology was consulted to assist in discerning if the etiology of his lactic acidosis is cardiac related and for elevated troponin.  # Lactic acidosis # Intermittent complete heart block # NSTEMI; suspect type I # Chronic microcytic anemia # Diabetes  Regarding his lactic acidosis, his hemodynamics show a normal blood pressure (125/65) without a small pulse pressure. He does not have cool extremities on examination. He is able to lie flat despite having pleural effusions. He does not have known heart failure. He does have a known intermittent complete heart block for which he is currently in, but he has a narrow QRS that appears similar to his outpatient morphology.  Would recommend evaluating for noncardiac etiologies for elevated lactic acid levels.  Regarding his troponin elevation (481 >442; creatinine 2.26/baseline 1.0), differential includes NSTEMI type I, type 2 NSTEMI, and myocardial injury in the setting of an  AKI. However, given known triple vessel coronary artery calcifications seen on his CT today and new lateral T wave inversions on EKG, type I NSTEMI cannot be excluded. He does not endorse chest pain. However would also obtain CK level to ensure troponin is not elevated due to non-cardiac cause. Mr. Bogus was not able to fully participate in our discussion today due to decreased hearing and mental status change. However, we can discuss with him and his loved ones in the morning regarding medical management vs. an invasive management approach.  - Obtain CK level (added on to prior lab value)  - Obtain ECHO in the AM  - Obtain repeat hemoglobin.  If hemoglobin is not continuing to trend down, begin heparin gtt without bolus; monitor Hgb Q4 hours to evaluate for worsening of undiagnosed, but chronic anemia. Discontinue heparin gtt if hgb decreases further - Please further evaluate etiology of anemia; hemoglobin on arrival was 12.9 however that appears to be an area.  Second hemoglobin drawn was 10.7 third hemoglobin drawn was 9.8 and is unclear if this is an actual drop or if there is an error.   For questions or updates, please contact [facility] Please consult [email or web address] for contact info under     Signed, Merlene JAYSON Blood, MD  11/01/2024 11:54 PM   Merlene Blood, MD MS  Cardiology [employer]

## 2024-11-01 NOTE — ED Notes (Signed)
 CCMD called for continuous cardiac monitoring.

## 2024-11-01 NOTE — ED Notes (Signed)
 CCMD called to notify of room change.

## 2024-11-01 NOTE — Consult Note (Incomplete)
 Cardiology Consult Note:   Patient ID: Andre Stout MRN: [id number]; DOB: 1940-12-03   Admission date: 11/01/2024  Primary Care Provider: Georgina Leita Norris, DO Primary Cardiologist: Andre ONEIDA Decent, Stout *** Primary Electrophysiologist:  Andre Birmingham, Stout ***  Chief Complaint:  ***  Patient Profile:   Andre Stout is a 84 y.o. 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  CT head negative for acute process.  Past Medical History:  Diagnosis Date  . CKD (chronic kidney disease) stage 3, GFR 30-59 ml/min (HCC)   . Diabetes mellitus without complication (HCC)   . Glaucoma   . Heart block    complete heart block  . History of CVA (cerebrovascular accident)   . Hypertension   . Prostate cancer (HCC)    Been 3-4 years ago    Past Surgical History:  Procedure Laterality Date  . CATARACT EXTRACTION Bilateral   . INSERTION PROSTATE RADIATION SEED    . IR EXCHANGE BILIARY DRAIN  08/19/2023  . IR EXCHANGE BILIARY DRAIN  12/19/2023  . IR EXCHANGE BILIARY DRAIN  01/20/2024  . IR PERC CHOLECYSTOSTOMY  08/17/2022  . IR RADIOLOGIST EVAL & MGMT  09/28/2022  . IR RADIOLOGIST EVAL & MGMT  10/10/2022     Medications Prior to Admission: Prior to Admission medications   Medication Sig Start Date End Date Taking? Authorizing Provider  acetaminophen  (TYLENOL ) 500 MG tablet Take 1,000 mg by mouth every 6 (six) hours as needed for mild pain (pain score 1-3) or moderate pain (pain score 4-6).   Yes Andre Stout  amLODipine  (NORVASC ) 10 MG tablet Take 1 tablet (10 mg total) by mouth daily. 10/28/24  Yes Stout, Andre L, NP  atorvastatin  (LIPITOR) 40 MG tablet Take 1 tablet (40 mg total) by mouth daily. 10/28/24  Yes Stout, Andre L, NP  Azelastine  HCl 137 MCG/SPRAY SOLN  PLACE 1 SPRAY INTO BOTH NOSTRILS 2 (TWO) TIMES DAILY AS NEEDED. NEEDS APPT Patient taking differently: Place 1 spray into both nostrils 2 (two) times daily as needed (allergies). 07/13/24  Yes Andre Leita Andre Stout  LANTUS  SOLOSTAR 100 UNIT/ML Solostar Pen Inject 15 Units into the skin daily. 10/15/24  Yes Andre Stout  magnesium  oxide (MAG-OX) 400 (240 Mg) MG tablet TAKE 1 TABLET BY MOUTH TWICE A DAY Patient taking differently: Take 400 mg by mouth daily. 02/28/24  Yes Andre Leita Andre Stout  metFORMIN  (GLUCOPHAGE ) 500 MG tablet Take 1 tablet (500 mg total) by mouth 2 (two) times daily with a meal. 10/28/24  Yes Andre Harlene CROME, NP  Multiple Vitamins-Minerals (PRESERVISION AREDS 2) CAPS Take 1 capsule by mouth every evening.   Yes Andre Stout  NOVOLOG  MIX 70/30 FLEXPEN (70-30) 100 UNIT/ML FlexPen Inject 5 Units into the skin 3 (three) times daily. 10/15/24  Yes Andre Stout  omeprazole  (PRILOSEC) 40 MG capsule Take 1 capsule (40 mg total) by mouth in the morning. 10/28/24  Yes Stout, Andre L, NP  Continuous Glucose Sensor (FREESTYLE LIBRE 3 PLUS SENSOR) MISC 1 each by Does not apply route every 14 (fourteen) days. 10/27/24   Andre File, Stout  insulin  aspart (NOVOLOG  FLEXPEN) 100 UNIT/ML FlexPen Inject 5-7 Units into the skin 3 (three) times daily before meals. Patient not taking: Reported on 11/01/2024 10/27/24   Andre File, Stout  Insulin  Pen Needle 32G X 4 MM MISC Use to inject insulin  4 times a day 10/27/24   Andre File, Stout  Insulin  Syringe-Needle U-100 (BD INSULIN  SYRINGE U/F) 31G X 5/16 1 ML MISC USE 2 DAILY 11/21/22   Andre File, Stout  [facility] DELICA LANCETS 33G MISC Use to check blood sugar 4 times per day. Dx code: E11.9 03/27/16   Andre Mallick, Stout  [facility] ULTRA test strip USE TO MONITOR GLUCOSE LEVELS 4 TIMES PER DAY E11.9 06/10/19   Andre Mallick, Stout     Allergies:   No Known Allergies  Social History:    Social History   Socioeconomic History  . Marital status: Widowed    Spouse name: Not on Stout  . Number of children: 1  . Years of education: 71  . Highest education level: Not on Stout  Occupational History  . Occupation: Retired  Tobacco Use  . Smoking status: Former    Current packs/day: 0.00    Average packs/day: 0.5 packs/day for 4.0 years (2.0 ttl pk-yrs)    Types: Cigarettes    Start date: 10/29/1970    Quit date: 10/29/1974    Years since quitting: 50.0  . Smokeless tobacco: Never  Vaping Use  . Vaping status: Never Used  Substance and Sexual Activity  . Alcohol use: No  . Drug use: No  . Sexual activity: Not Currently  Other Topics Concern  . Not on Stout  Social History Narrative   Born and raised in [place], [place]. Currently resides with his daughter (temporary), but he still has a residence.  No live. Fun: hunt and fish   Denies religious beliefs that would effect health care.    Social Drivers of Corporate Investment Banker Strain: Low Risk  (10/26/2024)   Overall Financial Resource Strain (CARDIA)   . Difficulty of Paying Living Expenses: Not hard at all  Food Insecurity: No Food Insecurity (10/26/2024)   Hunger Vital Sign   . Worried About Programme Researcher, Broadcasting/film/video in the Last Year: Never true   . Ran Out of Food in the Last Year: Never true  Transportation Needs: No Transportation Needs (10/26/2024)   PRAPARE - Transportation   . Lack of Transportation (Medical): No   . Lack of Transportation (Non-Medical): No  Physical Activity: Inactive (10/26/2024)   Exercise Vital Sign   . Days of Exercise per Week: 0 days   . Minutes of Exercise per Session: Not on Stout  Stress: No Stress Concern Present (10/26/2024)   [employer] of Occupational Health - Occupational Stress Questionnaire   . Feeling of Stress: Not at all  Social Connections: Socially Isolated (10/26/2024)   Social Connection and Isolation Panel   . Frequency of Communication with Friends and  Family: Never   . Frequency of Social Gatherings with Friends and Family: More than three times a week   . Attends Religious Services: Never   . Active Member of Clubs or Organizations: No   . Attends Banker Meetings: Not on Stout   . Marital Status: Widowed  Intimate Partner Violence: Patient Unable To Answer (09/02/2024)   Humiliation, Afraid, Rape, and Kick questionnaire   . Fear of Current or Ex-Partner: Patient unable to answer   . Emotionally Abused: Patient unable to answer   . Physically Abused: Patient unable to answer   . Sexually Abused: Patient unable to answer    Family History:  *** The patient's family history includes Diabetes in his father and paternal grandfather;  Other in his mother.    Review of Systems: [y] = yes, [ ]  = no    General: Weight gain [ ] ; Weight loss [ ] ; Anorexia [ ] ; Fatigue [ ] ; Fever [ ] ; Chills [ ] ; Weakness [ ]   Cardiac: Chest pain/pressure [ ] ; Resting SOB [ ] ; Exertional SOB [ ] ; Orthopnea [ ] ; Pedal Edema [ ] ; Palpitations [ ] ; Syncope [ ] ; Presyncope [ ] ; Paroxysmal nocturnal dyspnea[ ]   Pulmonary: Cough [ ] ; Wheezing[ ] ; Hemoptysis[ ] ; Sputum [ ] ; Snoring [ ]   GI: Vomiting[ ] ; Dysphagia[ ] ; Melena[ ] ; Hematochezia [ ] ; Heartburn[ ] ; Abdominal pain [ ] ; Constipation [ ] ; Diarrhea [ ] ; BRBPR [ ]   GU: Hematuria[ ] ; Dysuria [ ] ; Nocturia[ ]   Vascular: Pain in legs with walking [ ] ; Pain in feet with lying flat [ ] ; Non-healing sores [ ] ; Stroke [ ] ; TIA [ ] ; Slurred speech [ ] ;  Neuro: Headaches[ ] ; Vertigo[ ] ; Seizures[ ] ; Paresthesias[ ] ;Blurred vision [ ] ; Diplopia [ ] ; Vision changes [ ]   Ortho/Skin: Arthritis [ ] ; Joint pain [ ] ; Muscle pain [ ] ; Joint swelling [ ] ; Back Pain [ ] ; Rash [ ]   Psych: Depression[ ] ; Anxiety[ ]   Heme: Bleeding problems [ ] ; Clotting disorders [ ] ; Anemia [ ]   Endocrine: Diabetes [ ] ; Thyroid  dysfunction[ ]   Physical Exam/Data:   Vitals:   11/01/24 2115 11/01/24 2130 11/01/24 2145 11/01/24 2147  BP:  101/74 115/81 124/74   Pulse: 65 66 69   Resp: 16 20 19    Temp:    (!) 97.5 F (36.4 C)  TempSrc:    Oral  SpO2: 96% 98% 98%   Weight:      Height:       No intake or output data in the 24 hours ending 11/01/24 2354 Filed Weights   11/01/24 1745  Weight: 72 kg   Body mass index is 31 kg/m.  General:  Well nourished, well developed, in no acute distress*** HEENT: normal Neck: no*** JVD Vascular: No carotid bruits; FA pulses 2+ bilaterally without bruits  Cardiac:  normal S1, S2; RRR; no murmur *** Lungs:  clear to auscultation bilaterally, no wheezing, rhonchi or rales  Abd: soft, nontender, no hepatomegaly  Ext: no*** edema Musculoskeletal:  No deformities Skin: warm and dry  Psych:  Normal affect    EKG:  The ECG that was done *** was personally reviewed and demonstrates ***  Relevant CV Studies: ***  Laboratory Data: ***  Radiology/Studies:  CT CHEST ABDOMEN PELVIS WO CONTRAST Result Date: 11/01/2024 EXAM: CT CHEST, ABDOMEN AND PELVIS WITHOUT CONTRAST 11/01/2024 08:09:41 PM TECHNIQUE: CT of the chest, abdomen and pelvis was performed without the administration of intravenous contrast. Multiplanar reformatted images are provided for review. Automated exposure control, iterative reconstruction, and/or weight based adjustment of the mA/kV was utilized to reduce the radiation dose to as low as reasonably achievable. COMPARISON: 09/02/2024 CLINICAL HISTORY: Sepsis. FINDINGS: CHEST: MEDIASTINUM AND LYMPH NODES: Mild cardiomegaly. Coronary artery and aortic atherosclerosis. The central airways are clear. No mediastinal, hilar or axillary lymphadenopathy. LUNGS AND PLEURA: Moderate to large bilateral pleural effusions. Compressive atelectasis in the lower lobes bilaterally. Scattered ground glass opacities throughout the lungs may reflect early edema. No pneumothorax. ABDOMEN AND PELVIS: LIVER: The liver is unremarkable. GALLBLADDER AND BILE DUCTS: Small layering gallstones within  the gallbladder. No biliary ductal dilatation. SPLEEN: No acute abnormality. PANCREAS: No acute abnormality. ADRENAL GLANDS: No acute abnormality. KIDNEYS, URETERS AND BLADDER: Stable 11 cm cyst  in the mid to lower pole of the right kidney. No stones in the kidneys or ureters. No hydronephrosis. No perinephric or periureteral stranding. Urinary bladder is unremarkable. GI AND BOWEL: Stomach demonstrates no acute abnormality. There is no bowel obstruction. REPRODUCTIVE ORGANS: Radiation seeds in the region of the prostate. PERITONEUM AND RETROPERITONEUM: No ascites. No free air. VASCULATURE: Aorta is normal in caliber. Aortic atherosclerosis. ABDOMINAL AND PELVIS LYMPH NODES: No lymphadenopathy. BONES AND SOFT TISSUES: Severe chronic compression fracture at L1, unchanged. No focal soft tissue abnormality. IMPRESSION: 1. Moderate to large bilateral pleural effusions with compressive atelectasis in the lower lobes bilaterally. 2. Scattered ground-glass opacities throughout the lungs, possibly reflecting early edema. 3. Mild cardiomegaly. 4. Cholelithiasis. 5. No acute findings in the abdomen or pelvis. Electronically signed by: Franky Crease Stout 11/01/2024 08:18 PM EST RP Workstation: [id number]   CT Head Wo Contrast Result Date: 11/01/2024 EXAM: CT HEAD WITHOUT CONTRAST 11/01/2024 08:09:41 PM TECHNIQUE: CT of the head was performed without the administration of intravenous contrast. Automated exposure control, iterative reconstruction, and/or weight based adjustment of the mA/kV was utilized to reduce the radiation dose to as low as reasonably achievable. COMPARISON: CT 09/02/2024 CLINICAL HISTORY: Mental status change, unknown cause. FINDINGS: BRAIN AND VENTRICLES: No acute hemorrhage. No evidence of acute infarct. No hydrocephalus. No extra-axial collection. No mass effect or midline shift. Diffuse cerebral and cerebellar atrophy. Patchy and confluent areas of decreased attenuation are noted throughout the deep and  periventricular white matter of the cerebral hemispheres bilaterally, suggestive of chronic microvascular ischemic changes. Atherosclerotic calcifications are present within the cavernous internal carotid and vertebral arteries. ORBITS: No acute abnormality. Bilateral lens replacement. SINUSES: No acute abnormality. SOFT TISSUES AND SKULL: Trace right posterior scalp hematoma (7.36). No skull fracture. IMPRESSION: 1. No acute intracranial abnormality. Electronically signed by: Morgane Naveau Stout 11/01/2024 08:16 PM EST RP Workstation: [id number]   DG Chest Port 1 View Result Date: 11/01/2024 EXAM: 1 VIEW(S) XRAY OF THE CHEST 11/01/2024 06:40:00 PM COMPARISON: 09/02/2024 CLINICAL HISTORY: Questionable sepsis - evaluate for abnormality. FINDINGS: LUNGS AND PLEURA: Consolidation in the left lower lobe concerning for pneumonia. Possible small left effusion. No pulmonary edema. No pneumothorax. HEART AND MEDIASTINUM: Heart is borderline in size. Aortic atherosclerosis. BONES AND SOFT TISSUES: No acute osseous abnormality. IMPRESSION: 1. Left lower lobe consolidation concerning for pneumonia. 2. Possible small left pleural effusion. Electronically signed by: Franky Crease Stout 11/01/2024 06:50 PM EST RP Workstation: [id number]    Assessment and Plan:   ***  Severity of Illness: {Observation/Inpatient:21159}   For questions or updates, please contact [facility] Please consult [email or web address] for contact info under        Signed, Merlene JAYSON Blood, Stout  11/01/2024 11:54 PM   Merlene Blood, MD MS  Cardiology [facility]

## 2024-11-01 NOTE — ED Provider Notes (Signed)
 [place] EMERGENCY DEPARTMENT AT [facility] Provider Note   CSN: [id number] Arrival date & time: 11/01/24  1731     Patient presents with: No chief complaint on file.   Andre Stout is a 84 y.o. male.  Patient is brought in by ambulance from home for reported altered mental status x 2 days.  Reportedly has not eaten or drank anything in 3 days.  Patient himself is not sure why he is here.  Level 5 caveat. Recent admission in September for confusion, aspiration pneumonia.  He states he lives with his sister.  {Add pertinent medical, surgical, social history, OB history to YEP:67052} The history is provided by the patient and the EMS personnel.  Altered Mental Status Presenting symptoms: confusion   Associated symptoms: no abdominal pain, no fever and no vomiting        Prior to Admission medications   Medication Sig Start Date End Date Taking? Authorizing Provider  acetaminophen  (TYLENOL ) 500 MG tablet Take 1,000 mg by mouth every 6 (six) hours as needed for mild pain (pain score 1-3) or moderate pain (pain score 4-6).    [provider]  amLODipine  (NORVASC ) 10 MG tablet Take 1 tablet (10 mg total) by mouth daily. 10/28/24   Wheeler Harlene CROME, NP  atorvastatin  (LIPITOR) 40 MG tablet Take 1 tablet (40 mg total) by mouth daily. 10/28/24   Yacopino, Jessica L, NP  Azelastine  HCl 137 MCG/SPRAY SOLN PLACE 1 SPRAY INTO BOTH NOSTRILS 2 (TWO) TIMES DAILY AS NEEDED. NEEDS APPT Patient taking differently: Place 1 spray into both nostrils 2 (two) times daily as needed (allergies). 07/13/24   Jason Leita Repine, FNP  Continuous Glucose Sensor (FREESTYLE LIBRE 3 PLUS SENSOR) MISC 1 each by Does not apply route every 14 (fourteen) days. 10/27/24   Trixie File, MD  insulin  aspart (NOVOLOG  FLEXPEN) 100 UNIT/ML FlexPen Inject 5-7 Units into the skin 3 (three) times daily before meals. 10/27/24   Trixie File, MD  insulin  glargine (LANTUS ) 100 UNIT/ML injection  Inject 0.1 mLs (10 Units total) into the skin at bedtime. Patient taking differently: Inject 15 Units into the skin at bedtime. 09/08/24   Sigdel, Santosh, MD  Insulin  Pen Needle 32G X 4 MM MISC Use to inject insulin  4 times a day 10/27/24   Trixie File, MD  Insulin  Syringe-Needle U-100 (BD INSULIN  SYRINGE U/F) 31G X 5/16 1 ML MISC USE 2 DAILY 11/21/22   Trixie File, MD  magnesium  oxide (MAG-OX) 400 (240 Mg) MG tablet TAKE 1 TABLET BY MOUTH TWICE A DAY Patient taking differently: Take 400 mg by mouth daily. 02/28/24   Jason Leita Repine, FNP  metFORMIN  (GLUCOPHAGE ) 500 MG tablet Take 1 tablet (500 mg total) by mouth 2 (two) times daily with a meal. 10/28/24   Wheeler Harlene CROME, NP  Multiple Vitamins-Minerals (PRESERVISION AREDS 2) CAPS Take 1 capsule by mouth every evening.    [provider]  omeprazole  (PRILOSEC) 40 MG capsule Take 1 capsule (40 mg total) by mouth in the morning. 10/28/24   Wheeler Harlene CROME, NP  ONETOUCH DELICA LANCETS 33G MISC Use to check blood sugar 4 times per day. Dx code: E11.9 03/27/16   Kassie Mallick, MD  [facility] ULTRA test strip USE TO MONITOR GLUCOSE LEVELS 4 TIMES PER DAY E11.9 06/10/19   Kassie Mallick, MD    Allergies: Patient has no known allergies.    Review of Systems  Unable to perform ROS: Mental status change  Constitutional:  Negative for fever.  Gastrointestinal:  Negative for abdominal pain and vomiting.  Psychiatric/Behavioral:  Positive for confusion.     Updated Vital Signs BP 107/60   Pulse 71   Temp 97.9 F (36.6 C)   Resp 18   Ht 5' (1.524 m)   Wt 72 kg   SpO2 100%   BMI 31.00 kg/m   Physical Exam Vitals and nursing note reviewed.  Constitutional:      General: He is not in acute distress.    Appearance: Normal appearance. He is well-developed.  HENT:     Head: Normocephalic and atraumatic.  Eyes:     Conjunctiva/sclera: Conjunctivae normal.  Cardiovascular:     Rate and Rhythm: Normal rate and  regular rhythm.     Heart sounds: No murmur heard. Pulmonary:     Effort: Pulmonary effort is normal. No respiratory distress.     Breath sounds: Rhonchi present.  Abdominal:     General: There is distension.     Palpations: Abdomen is soft.     Tenderness: There is no abdominal tenderness. There is no guarding or rebound.  Musculoskeletal:     Cervical back: Neck supple.     Right lower leg: Edema present.     Left lower leg: Edema present.  Skin:    General: Skin is warm and dry.     Capillary Refill: Capillary refill takes less than 2 seconds.  Neurological:     General: No focal deficit present.     Mental Status: He is alert. He is disoriented.     Motor: No weakness.     (all labs ordered are listed, but only abnormal results are displayed) Labs Reviewed  COMPREHENSIVE METABOLIC PANEL WITH GFR - Abnormal; Notable for the following components:      Result Value   Sodium 127 (*)    Potassium 5.4 (*)    Chloride 94 (*)    CO2 20 (*)    Glucose, Bld 350 (*)    BUN 50 (*)    Creatinine, Ser 2.29 (*)    Calcium  8.8 (*)    Albumin  2.9 (*)    GFR, Estimated 28 (*)    All other components within normal limits  CBC WITH DIFFERENTIAL/PLATELET - Abnormal; Notable for the following components:   Hemoglobin 10.7 (*)    HCT 34.3 (*)    MCH 25.0 (*)    RDW 16.7 (*)    All other components within normal limits  PROTIME-INR - Abnormal; Notable for the following components:   Prothrombin Time 15.4 (*)    All other components within normal limits  URINALYSIS, W/ REFLEX TO CULTURE (INFECTION SUSPECTED) - Abnormal; Notable for the following components:   Glucose, UA 150 (*)    Protein, ur 30 (*)    Leukocytes,Ua TRACE (*)    Bacteria, UA RARE (*)    All other components within normal limits  BRAIN NATRIURETIC PEPTIDE - Abnormal; Notable for the following components:   B Natriuretic Peptide 2,276.0 (*)    All other components within normal limits  CBG MONITORING, ED - Abnormal;  Notable for the following components:   Glucose-Capillary 308 (*)    All other components within normal limits  I-STAT CG4 LACTIC ACID, ED - Abnormal; Notable for the following components:   Lactic Acid, Venous 3.6 (*)    All other components within normal limits  I-STAT VENOUS BLOOD GAS, ED - Abnormal; Notable for the following components:   pCO2, Ven 34.3 (*)    pO2,  Ven 70 (*)    Acid-base deficit 4.0 (*)    Sodium 128 (*)    Potassium 5.4 (*)    Calcium , Ion 1.12 (*)    HCT 38.0 (*)    Hemoglobin 12.9 (*)    All other components within normal limits  I-STAT CG4 LACTIC ACID, ED - Abnormal; Notable for the following components:   Lactic Acid, Venous 2.9 (*)    All other components within normal limits  TROPONIN I (HIGH SENSITIVITY) - Abnormal; Notable for the following components:   Troponin I (High Sensitivity) 481 (*)    All other components within normal limits  RESP PANEL BY RT-PCR (RSV, FLU A&B, COVID)  RVPGX2  CULTURE, BLOOD (ROUTINE X 2)  CULTURE, BLOOD (ROUTINE X 2)  C DIFFICILE QUICK SCREEN W PCR REFLEX    GASTROINTESTINAL PANEL BY PCR, STOOL (REPLACES STOOL CULTURE)  TROPONIN I (HIGH SENSITIVITY)    EKG: EKG Interpretation Date/Time:  Sunday November 01 2024 17:57:14 EST Ventricular Rate:  68 PR Interval:    QRS Duration:  113 QT Interval:  423 QTC Calculation: 447 R Axis:   -44  Text Interpretation: AV block, 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 FINDINGS: LUNGS AND PLEURA: Consolidation in the left lower lobe concerning for pneumonia. Possible small left effusion. No pulmonary edema. No pneumothorax. HEART  AND MEDIASTINUM: Heart is borderline in size. Aortic atherosclerosis. BONES AND SOFT TISSUES: No acute osseous abnormality. IMPRESSION: 1. Left lower lobe consolidation concerning for pneumonia. 2. Possible small left pleural effusion. Electronically signed by: Franky Crease MD 11/01/2024 06:50 PM EST RP Workstation: [id number]    {Document cardiac monitor, telemetry assessment procedure when appropriate:32947} .Critical Care  Performed by: Towana Sharper BROCKS, MD Authorized by: Towana Sharper BROCKS, MD   Critical care provider statement:    Critical care time (minutes):  45   Critical care time was exclusive of:  Separately billable procedures and treating other patients   Critical care was necessary to treat or prevent imminent or life-threatening deterioration of the following conditions:  Sepsis   Critical care was time spent personally by me on the following activities:  Development of treatment plan with patient or surrogate, discussions with consultants, evaluation of patient's response to treatment, examination of patient, obtaining history from patient or surrogate, ordering and performing treatments and interventions, ordering and review of laboratory studies, ordering and review of radiographic studies, pulse oximetry, re-evaluation of patient's condition and review of old charts   I assumed direction of critical care for this patient from another provider in my specialty: no      Medications Ordered in the ED  sodium chloride  0.9 % bolus 1,000 mL (1,000 mLs Intravenous New Bag/Given 11/01/24 1907)  cefTRIAXone  (ROCEPHIN ) 1 g in sodium chloride  0.9 % 100 mL IVPB (1 g Intravenous New Bag/Given 11/01/24 1907)  doxycycline  (VIBRA -TABS) tablet 100 mg (100 mg Oral Given 11/01/24 1904)    Clinical Course as of 11/01/24 2045  Sun Nov 01, 2024  1830 Lactate coming back elevated at 3.6.  Will give him IV fluids. [MB]  1850 Chest x-ray interpreted by me as showing possible left lower lobe infiltrate.   Awaiting radiology reading. [MB]  1908 I reach  the patient's daughter who is listed to his his emergency contact.  She said he has been back from rehab for a couple of weeks.  Walking with a walker but still a one-person assist but has declined over the past few days.  Not eating or drinking.  Has had diarrhea.  She said the confusion was at baseline [MB]  1910 Daughter did confirm he is DNR DNI but otherwise wants full scope of care [MB]  1937 Discussed with Dr. Franky [facility] hospitalist will evaluate patient for admission.  He asked if we can put him in for a CT head chest abdomen and pelvis. [MB]  2034 I messaged Dr. Franky regarding his elevated troponin and CT results.  He asked if I could place a consult into cardiology. [MB]  2044 I consulted Dr. Duffy cardiology who said she would be by to see patient.  [MB]    Clinical Course User Index [MB] Towana Ozell BROCKS, MD   {Click here for ABCD2, HEART and other calculators REFRESH Note before signing:1}                              Medical Decision Making Amount and/or Complexity of Data Reviewed Labs: ordered. Radiology: ordered.  Risk Prescription drug management. Decision regarding hospitalization.   This patient complains of ***; this involves an extensive number of treatment Options and is a complaint that carries with it a high risk of complications and morbidity. The differential includes ***  I ordered, reviewed and interpreted labs, which included *** I ordered medication *** and reviewed PMP when indicated. I ordered imaging studies which included *** and I independently    visualized and interpreted imaging which showed *** Additional history obtained from *** Previous records obtained and reviewed *** I consulted *** and discussed lab and imaging findings and discussed disposition.  Cardiac monitoring reviewed, *** Social determinants considered, *** Critical Interventions: ***  After the interventions stated  above, I reevaluated the patient and found *** Admission and further testing considered, ***   {Document critical care time when appropriate  Document review of labs and clinical decision tools ie CHADS2VASC2, etc  Document your independent review of radiology images and any outside records  Document your discussion with family members, caretakers and with consultants  Document social determinants of health affecting pt's care  Document your decision making why or why not admission, treatments were needed:32947:::1}   Final diagnoses:  None    ED Discharge Orders     None

## 2024-11-01 NOTE — H&P (Incomplete)
 History and Physical    Andre Stout FMW:[id number] DOB: 1940-01-13 DOA: 11/01/2024  Patient coming from: Home.  Chief Complaint: Poor appetite and diarrhea.  History obtained from patient's daughter.  HPI: Andre Stout is a 84 y.o. male with history of diabetes mellitus type 2, hypertension, chronic anemia hyperlipidemia history of complete heart block prior history of prostate cancer was brought to the ER after patient's daughter noticed that patient has not been eating well for the last 2 days and has been having diarrhea for last 4 days.  Per patient's daughter patient has some cognitive impairment for the last 3 to 4 years.  Recently was admitted and September 2025 for aspiration pneumonia.  Patient as per the daughter has not had any recent travel or sick contacts.  Has been having multiple episodes of watery diarrhea.  ED Course: In the ER patient had CT head chest abdomen pelvis which shows large bilateral pleural effusion.  Some hazy opacities concerning for CHF.  Labs show sodium of 127 creatinine of 2.2 which increased from 1.1 in September 2025 hemoglobin of 10.7 BNP of 2200 with troponin of 481 and lactic acid was elevated 3.6 which improved to 2.9 after liter fluid bolus was given.  Patient was also empirically started on antibiotics.  At the time of my exam patient is not in distress.  Cardiology also was consulted given the elevated troponin and BNP and pleural fluid.  Patient admitted for further workup.  Patient presently on 4 L oxygen which is new.  Review of Systems: As per HPI, rest all negative.   Past Medical History:  Diagnosis Date   CKD (chronic kidney disease) stage 3, GFR 30-59 ml/min (HCC)    Diabetes mellitus without complication (HCC)    Glaucoma    Heart block    complete heart block   History of CVA (cerebrovascular accident)    Hypertension    Prostate cancer (HCC)    Been 3-4 years ago    Past Surgical History:  Procedure Laterality Date    CATARACT EXTRACTION Bilateral    INSERTION PROSTATE RADIATION SEED     IR EXCHANGE BILIARY DRAIN  08/19/2023   IR EXCHANGE BILIARY DRAIN  12/19/2023   IR EXCHANGE BILIARY DRAIN  01/20/2024   IR PERC CHOLECYSTOSTOMY  08/17/2022   IR RADIOLOGIST EVAL & MGMT  09/28/2022   IR RADIOLOGIST EVAL & MGMT  10/10/2022     reports that he quit smoking about 50 years ago. His smoking use included cigarettes. He started smoking about 54 years ago. He has a 2 pack-year smoking history. He has never used smokeless tobacco. He reports that he does not drink alcohol and does not use drugs.  No Known Allergies  Family History  Problem Relation Age of Onset   Other Mother        unknown medical history   Diabetes Father    Diabetes Paternal Grandfather     Prior to Admission medications   Medication Sig Start Date End Date Taking? Authorizing Provider  acetaminophen  (TYLENOL ) 500 MG tablet Take 1,000 mg by mouth every 6 (six) hours as needed for mild pain (pain score 1-3) or moderate pain (pain score 4-6).   Yes [provider]  amLODipine  (NORVASC ) 10 MG tablet Take 1 tablet (10 mg total) by mouth daily. 10/28/24  Yes Yacopino, Jessica L, NP  atorvastatin  (LIPITOR) 40 MG tablet Take 1 tablet (40 mg total) by mouth daily. 10/28/24  Yes Wheeler Harlene CROME, NP  Azelastine   HCl 137 MCG/SPRAY SOLN PLACE 1 SPRAY INTO BOTH NOSTRILS 2 (TWO) TIMES DAILY AS NEEDED. NEEDS APPT Patient taking differently: Place 1 spray into both nostrils 2 (two) times daily as needed (allergies). 07/13/24  Yes Jason Leita Repine, FNP  LANTUS  SOLOSTAR 100 UNIT/ML Solostar Pen Inject 15 Units into the skin daily. 10/15/24  Yes [provider]  magnesium  oxide (MAG-OX) 400 (240 Mg) MG tablet TAKE 1 TABLET BY MOUTH TWICE A DAY Patient taking differently: Take 400 mg by mouth daily. 02/28/24  Yes Jason Leita Repine, FNP  metFORMIN  (GLUCOPHAGE ) 500 MG tablet Take 1 tablet (500 mg total) by mouth 2 (two) times daily  with a meal. 10/28/24  Yes Wheeler Harlene CROME, NP  Multiple Vitamins-Minerals (PRESERVISION AREDS 2) CAPS Take 1 capsule by mouth every evening.   Yes [provider]  NOVOLOG  MIX 70/30 FLEXPEN (70-30) 100 UNIT/ML FlexPen Inject 5 Units into the skin 3 (three) times daily. 10/15/24  Yes [provider]  omeprazole  (PRILOSEC) 40 MG capsule Take 1 capsule (40 mg total) by mouth in the morning. 10/28/24  Yes Yacopino, Jessica L, NP  Continuous Glucose Sensor (FREESTYLE LIBRE 3 PLUS SENSOR) MISC 1 each by Does not apply route every 14 (fourteen) days. 10/27/24   Trixie File, MD  insulin  aspart (NOVOLOG  FLEXPEN) 100 UNIT/ML FlexPen Inject 5-7 Units into the skin 3 (three) times daily before meals. Patient not taking: Reported on 11/01/2024 10/27/24   Trixie File, MD  Insulin  Pen Needle 32G X 4 MM MISC Use to inject insulin  4 times a day 10/27/24   Trixie File, MD  Insulin  Syringe-Needle U-100 (BD INSULIN  SYRINGE U/F) 31G X 5/16 1 ML MISC USE 2 DAILY 11/21/22   Trixie File, MD  [facility] DELICA LANCETS 33G MISC Use to check blood sugar 4 times per day. Dx code: E11.9 03/27/16   Kassie Mallick, MD  [facility] ULTRA test strip USE TO MONITOR GLUCOSE LEVELS 4 TIMES PER DAY E11.9 06/10/19   Kassie Mallick, MD    Physical Exam: Constitutional: Moderately built and nourished. Vitals:   11/01/24 1745 11/01/24 1930 11/01/24 2000 11/01/24 2030  BP: 107/60 119/60 111/62 110/66  Pulse: 71 65 68 64  Resp: 18 16 (!) 24 15  Temp: 97.9 F (36.6 C)     SpO2: 100% 100% 98% 97%  Weight: 72 kg     Height: 5' (1.524 m)      Eyes: Anicteric no pallor. ENMT: No discharge from the ears eyes nose or mouth. Neck: No mass felt.  No neck rigidity. Respiratory: No rhonchi or crepitations. Cardiovascular: S1 S2 heard. Abdomen: Soft nontender bowel sounds present. Musculoskeletal: Mild edema. Skin: Chronic skin changes of the lower extremity. Neurologic: Alert awake oriented to his  name only.  As per the patient's daughter that is his baseline.  Moving all extremities. Psychiatric: Oriented to his name.   Labs on Admission: I have personally reviewed following labs and imaging studies  CBC: Recent Labs  Lab 11/01/24 1825 11/01/24 1830  WBC  --  8.0  NEUTROABS  --  6.2  HGB 12.9* 10.7*  HCT 38.0* 34.3*  MCV  --  80.1  PLT  --  397   Basic Metabolic Panel: Recent Labs  Lab 11/01/24 1825 11/01/24 1830  NA 128* 127*  K 5.4* 5.4*  CL  --  94*  CO2  --  20*  GLUCOSE  --  350*  BUN  --  50*  CREATININE  --  2.29*  CALCIUM   --  8.8*   GFR: Estimated Creatinine Clearance: 20.3 mL/min (A) (by C-G formula based on SCr of 2.29 mg/dL (H)). Liver Function Tests: Recent Labs  Lab 11/01/24 1830  AST 15  ALT 12  ALKPHOS 78  BILITOT 0.9  PROT 6.9  ALBUMIN  2.9*   No results for input(s): LIPASE, AMYLASE in the last 168 hours. No results for input(s): AMMONIA in the last 168 hours. Coagulation Profile: Recent Labs  Lab 11/01/24 1830  INR 1.2   Cardiac Enzymes: No results for input(s): CKTOTAL, CKMB, CKMBINDEX, TROPONINI in the last 168 hours. BNP (last 3 results) No results for input(s): PROBNP in the last 8760 hours. HbA1C: No results for input(s): HGBA1C in the last 72 hours. CBG: Recent Labs  Lab 11/01/24 1743  GLUCAP 308*   Lipid Profile: No results for input(s): CHOL, HDL, LDLCALC, TRIG, CHOLHDL, LDLDIRECT in the last 72 hours. Thyroid  Function Tests: No results for input(s): TSH, T4TOTAL, FREET4, T3FREE, THYROIDAB in the last 72 hours. Anemia Panel: No results for input(s): VITAMINB12, FOLATE, FERRITIN, TIBC, IRON, RETICCTPCT in the last 72 hours. Urine analysis:    Component Value Date/Time   COLORURINE YELLOW 11/01/2024 1854   APPEARANCEUR CLEAR 11/01/2024 1854   LABSPEC 1.015 11/01/2024 1854   PHURINE 5.0 11/01/2024 1854   GLUCOSEU 150 (A) 11/01/2024 1854   HGBUR NEGATIVE  11/01/2024 1854   BILIRUBINUR NEGATIVE 11/01/2024 1854   KETONESUR NEGATIVE 11/01/2024 1854   PROTEINUR 30 (A) 11/01/2024 1854   UROBILINOGEN 1.0 10/04/2009 1109   NITRITE NEGATIVE 11/01/2024 1854   LEUKOCYTESUR TRACE (A) 11/01/2024 1854   Sepsis Labs: @LABRCNTIP (procalcitonin:4,lacticidven:4) ) Recent Results (from the past 240 hours)  Resp panel by RT-PCR (RSV, Flu A&B, Covid) Anterior Nasal Swab     Status: None   Collection Time: 11/01/24  5:50 PM   Specimen: Anterior Nasal Swab  Result Value Ref Range Status   SARS Coronavirus 2 by RT PCR NEGATIVE NEGATIVE Final   Influenza A by PCR NEGATIVE NEGATIVE Final   Influenza B by PCR NEGATIVE NEGATIVE Final    Comment: (NOTE) The Xpert Xpress SARS-CoV-2/FLU/RSV plus assay is intended as an aid in the diagnosis of influenza from Nasopharyngeal swab specimens and should not be used as a sole basis for treatment. Nasal washings and aspirates are unacceptable for Xpert Xpress SARS-CoV-2/FLU/RSV testing.  Fact Sheet for Patients: bloggercourse.com  Fact Sheet for Healthcare Providers: seriousbroker.it  This test is not yet approved or cleared by the [place]  FDA and has been authorized for detection and/or diagnosis of SARS-CoV-2 by FDA under an Emergency Use Authorization (EUA). This EUA will remain in effect (meaning this test can be used) for the duration of the COVID-19 declaration under Section 564(b)(1) of the Act, 21 U.S.C. section 360bbb-3(b)(1), unless the authorization is terminated or revoked.     Resp Syncytial Virus by PCR NEGATIVE NEGATIVE Final    Comment: (NOTE) Fact Sheet for Patients: bloggercourse.com  Fact Sheet for Healthcare Providers: seriousbroker.it  This test is not yet approved or cleared by the [place]  FDA and has been authorized for detection and/or diagnosis of SARS-CoV-2 by FDA under  an Emergency Use Authorization (EUA). This EUA will remain in effect (meaning this test can be used) for the duration of the COVID-19 declaration under Section 564(b)(1) of the Act, 21 U.S.C. section 360bbb-3(b)(1), unless the authorization is terminated or revoked.  Performed at [facility], [address] [place], [place] [address]      Radiological Exams on Admission: CT  CHEST ABDOMEN PELVIS WO CONTRAST Result Date: 11/01/2024 EXAM: CT CHEST, ABDOMEN AND PELVIS WITHOUT CONTRAST 11/01/2024 08:09:41 PM TECHNIQUE: CT of the chest, abdomen and pelvis was performed without the administration of intravenous contrast. Multiplanar reformatted images are provided for review. Automated exposure control, iterative reconstruction, and/or weight based adjustment of the mA/kV was utilized to reduce the radiation dose to as low as reasonably achievable. COMPARISON: 09/02/2024 CLINICAL HISTORY: Sepsis. FINDINGS: CHEST: MEDIASTINUM AND LYMPH NODES: Mild cardiomegaly. Coronary artery and aortic atherosclerosis. The central airways are clear. No mediastinal, hilar or axillary lymphadenopathy. LUNGS AND PLEURA: Moderate to large bilateral pleural effusions. Compressive atelectasis in the lower lobes bilaterally. Scattered ground glass opacities throughout the lungs may reflect early edema. No pneumothorax. ABDOMEN AND PELVIS: LIVER: The liver is unremarkable. GALLBLADDER AND BILE DUCTS: Small layering gallstones within the gallbladder. No biliary ductal dilatation. SPLEEN: No acute abnormality. PANCREAS: No acute abnormality. ADRENAL GLANDS: No acute abnormality. KIDNEYS, URETERS AND BLADDER: Stable 11 cm cyst in the mid to lower pole of the right kidney. No stones in the kidneys or ureters. No hydronephrosis. No perinephric or periureteral stranding. Urinary bladder is unremarkable. GI AND BOWEL: Stomach demonstrates no acute abnormality. There is no bowel obstruction. REPRODUCTIVE ORGANS: Radiation seeds in  the region of the prostate. PERITONEUM AND RETROPERITONEUM: No ascites. No free air. VASCULATURE: Aorta is normal in caliber. Aortic atherosclerosis. ABDOMINAL AND PELVIS LYMPH NODES: No lymphadenopathy. BONES AND SOFT TISSUES: Severe chronic compression fracture at L1, unchanged. No focal soft tissue abnormality. IMPRESSION: 1. Moderate to large bilateral pleural effusions with compressive atelectasis in the lower lobes bilaterally. 2. Scattered ground-glass opacities throughout the lungs, possibly reflecting early edema. 3. Mild cardiomegaly. 4. Cholelithiasis. 5. No acute findings in the abdomen or pelvis. Electronically signed by: Franky Crease MD 11/01/2024 08:18 PM EST RP Workstation: [id number]   CT Head Wo Contrast Result Date: 11/01/2024 EXAM: CT HEAD WITHOUT CONTRAST 11/01/2024 08:09:41 PM TECHNIQUE: CT of the head was performed without the administration of intravenous contrast. Automated exposure control, iterative reconstruction, and/or weight based adjustment of the mA/kV was utilized to reduce the radiation dose to as low as reasonably achievable. COMPARISON: CT 09/02/2024 CLINICAL HISTORY: Mental status change, unknown cause. FINDINGS: BRAIN AND VENTRICLES: No acute hemorrhage. No evidence of acute infarct. No hydrocephalus. No extra-axial collection. No mass effect or midline shift. Diffuse cerebral and cerebellar atrophy. Patchy and confluent areas of decreased attenuation are noted throughout the deep and periventricular white matter of the cerebral hemispheres bilaterally, suggestive of chronic microvascular ischemic changes. Atherosclerotic calcifications are present within the cavernous internal carotid and vertebral arteries. ORBITS: No acute abnormality. Bilateral lens replacement. SINUSES: No acute abnormality. SOFT TISSUES AND SKULL: Trace right posterior scalp hematoma (7.36). No skull fracture. IMPRESSION: 1. No acute intracranial abnormality. Electronically signed by: Morgane Naveau MD  11/01/2024 08:16 PM EST RP Workstation: [id number]   DG Chest Port 1 View Result Date: 11/01/2024 EXAM: 1 VIEW(S) XRAY OF THE CHEST 11/01/2024 06:40:00 PM COMPARISON: 09/02/2024 CLINICAL HISTORY: Questionable sepsis - evaluate for abnormality. FINDINGS: LUNGS AND PLEURA: Consolidation in the left lower lobe concerning for pneumonia. Possible small left effusion. No pulmonary edema. No pneumothorax. HEART AND MEDIASTINUM: Heart is borderline in size. Aortic atherosclerosis. BONES AND SOFT TISSUES: No acute osseous abnormality. IMPRESSION: 1. Left lower lobe consolidation concerning for pneumonia. 2. Possible small left pleural effusion. Electronically signed by: Franky Crease MD 11/01/2024 06:50 PM EST RP Workstation: [id number]    EKG: Independently reviewed.  EKG shows possible cardiac block.  Will discuss with cardiology.  Assessment/Plan Principal Problem:   ARF (acute renal failure) Active Problems:   Hyponatremia   Microcytic anemia   Prostate cancer (HCC)   Elevated troponin   Diarrhea   Uncontrolled type 2 diabetes mellitus with hyperglycemia (HCC)   Pleural effusion    Acute renal failure on chronic kidney disease stage III with hyponatremia likely from poor oral intake and diarrhea.  Patient did receive 1 L fluid bolus.  Given the large pleural effusion and concern for CHF will hold off further fluids.  Closely monitor metabolic panel intake output.  Lactic acid was elevated but improving after fluids.  Not sure if it is due to CHF. Elevated troponin with bilateral pleural effusions with elevated BNP concerning for CHF and also non-ST elevation MI.  EKG also shows possible cardiac block.  Consulted cardiology for further opinion.  May need thoracentesis.  Check 2D echo. Acute respiratory failure with hypoxia presently on 4 L oxygen likely from #2 concerning for CHF.  Also on empiric antibiotics for possible pneumonia which will be discontinued if procalcitonin negative. Diarrhea check  stool studies. Hypertension on amlodipine . Hyperlipidemia on statins. Diabetes mellitus type 2 with hyperglycemia uncontrolled takes Lantus  15 units in the morning along with sliding scale coverage.  Since patient's poor oral intake we will keep patient's every 4 sliding scale coverage and based on the CBG we will add Lantus .  Last hemoglobin A1c was 10.7 3 months ago. Chronic anemia  -   follow CBC. Cognitive impairment - as per patient's daughter patient has been having chronic impairment of cognition.  Has not had any formal workup for this.  For now we will check TSH and ammonia B12 RPR.  Will discontinue antibiotics if procalcitonin negative.  Was started in the ER for possible pneumonia.   DVT prophylaxis: Heparin. Code Status: DNR confirmed with patient's daughter. Family Communication: Patient's daughter. Disposition Plan: Monitored bed. Consults called: Cardiology. Admission status: Inpatient.

## 2024-11-02 ENCOUNTER — Inpatient Hospital Stay (HOSPITAL_COMMUNITY)

## 2024-11-02 DIAGNOSIS — R7989 Other specified abnormal findings of blood chemistry: Secondary | ICD-10-CM | POA: Diagnosis not present

## 2024-11-02 DIAGNOSIS — I509 Heart failure, unspecified: Secondary | ICD-10-CM

## 2024-11-02 DIAGNOSIS — I10 Essential (primary) hypertension: Secondary | ICD-10-CM | POA: Diagnosis not present

## 2024-11-02 DIAGNOSIS — J9601 Acute respiratory failure with hypoxia: Secondary | ICD-10-CM | POA: Diagnosis not present

## 2024-11-02 DIAGNOSIS — N170 Acute kidney failure with tubular necrosis: Secondary | ICD-10-CM | POA: Diagnosis not present

## 2024-11-02 DIAGNOSIS — I214 Non-ST elevation (NSTEMI) myocardial infarction: Secondary | ICD-10-CM | POA: Diagnosis not present

## 2024-11-02 DIAGNOSIS — E785 Hyperlipidemia, unspecified: Secondary | ICD-10-CM

## 2024-11-02 LAB — CBC
HCT: 28.7 % — ABNORMAL LOW (ref 39.0–52.0)
Hemoglobin: 9.3 g/dL — ABNORMAL LOW (ref 13.0–17.0)
MCH: 25.5 pg — ABNORMAL LOW (ref 26.0–34.0)
MCHC: 32.4 g/dL (ref 30.0–36.0)
MCV: 78.8 fL — ABNORMAL LOW (ref 80.0–100.0)
Platelets: 326 K/uL (ref 150–400)
RBC: 3.64 MIL/uL — ABNORMAL LOW (ref 4.22–5.81)
RDW: 16.7 % — ABNORMAL HIGH (ref 11.5–15.5)
WBC: 6.8 K/uL (ref 4.0–10.5)
nRBC: 0 % (ref 0.0–0.2)

## 2024-11-02 LAB — COMPREHENSIVE METABOLIC PANEL WITH GFR
ALT: 10 U/L (ref 0–44)
AST: 13 U/L — ABNORMAL LOW (ref 15–41)
Albumin: 2.5 g/dL — ABNORMAL LOW (ref 3.5–5.0)
Alkaline Phosphatase: 67 U/L (ref 38–126)
Anion gap: 10 (ref 5–15)
BUN: 43 mg/dL — ABNORMAL HIGH (ref 8–23)
CO2: 20 mmol/L — ABNORMAL LOW (ref 22–32)
Calcium: 7.9 mg/dL — ABNORMAL LOW (ref 8.9–10.3)
Chloride: 103 mmol/L (ref 98–111)
Creatinine, Ser: 2.04 mg/dL — ABNORMAL HIGH (ref 0.61–1.24)
GFR, Estimated: 32 mL/min — ABNORMAL LOW (ref >60)
Glucose, Bld: 76 mg/dL (ref 70–99)
Potassium: 4.2 mmol/L (ref 3.5–5.1)
Sodium: 133 mmol/L — ABNORMAL LOW (ref 135–145)
Total Bilirubin: 0.4 mg/dL (ref 0.0–1.2)
Total Protein: 6 g/dL — ABNORMAL LOW (ref 6.5–8.1)

## 2024-11-02 LAB — CK: Total CK: 37 U/L — ABNORMAL LOW (ref 49–397)

## 2024-11-02 LAB — CBC WITH DIFFERENTIAL/PLATELET
Abs Immature Granulocytes: 0.02 K/uL (ref 0.00–0.07)
Basophils Absolute: 0 K/uL (ref 0.0–0.1)
Basophils Relative: 0 %
Eosinophils Absolute: 0.2 K/uL (ref 0.0–0.5)
Eosinophils Relative: 3 %
HCT: 28.6 % — ABNORMAL LOW (ref 39.0–52.0)
Hemoglobin: 9 g/dL — ABNORMAL LOW (ref 13.0–17.0)
Immature Granulocytes: 0 %
Lymphocytes Relative: 28 %
Lymphs Abs: 1.9 K/uL (ref 0.7–4.0)
MCH: 24.9 pg — ABNORMAL LOW (ref 26.0–34.0)
MCHC: 31.5 g/dL (ref 30.0–36.0)
MCV: 79.2 fL — ABNORMAL LOW (ref 80.0–100.0)
Monocytes Absolute: 0.6 K/uL (ref 0.1–1.0)
Monocytes Relative: 8 %
Neutro Abs: 4.2 K/uL (ref 1.7–7.7)
Neutrophils Relative %: 61 %
Platelets: 380 K/uL (ref 150–400)
RBC: 3.61 MIL/uL — ABNORMAL LOW (ref 4.22–5.81)
RDW: 16.7 % — ABNORMAL HIGH (ref 11.5–15.5)
WBC: 6.9 K/uL (ref 4.0–10.5)
nRBC: 0 % (ref 0.0–0.2)

## 2024-11-02 LAB — BLOOD CULTURE ID PANEL (REFLEXED) - BCID2

## 2024-11-02 LAB — PROCALCITONIN: Procalcitonin: 5.63 ng/mL

## 2024-11-02 LAB — IRON AND TIBC
Iron: 28 ug/dL — ABNORMAL LOW (ref 45–182)
Saturation Ratios: 13 % — ABNORMAL LOW (ref 17.9–39.5)
TIBC: 217 ug/dL — ABNORMAL LOW (ref 250–450)
UIBC: 189 ug/dL

## 2024-11-02 LAB — CBG MONITORING, ED
Glucose-Capillary: 163 mg/dL — ABNORMAL HIGH (ref 70–99)
Glucose-Capillary: 82 mg/dL (ref 70–99)
Glucose-Capillary: 73 mg/dL (ref 70–99)
Glucose-Capillary: 239 mg/dL — ABNORMAL HIGH (ref 70–99)
Glucose-Capillary: 183 mg/dL — ABNORMAL HIGH (ref 70–99)
Glucose-Capillary: 195 mg/dL — ABNORMAL HIGH (ref 70–99)

## 2024-11-02 LAB — FOLATE: Folate: 8 ng/mL (ref >5.9)

## 2024-11-02 LAB — VITAMIN B12: Vitamin B-12: <150 pg/mL — ABNORMAL LOW (ref 180–914)

## 2024-11-02 LAB — RPR: RPR Ser Ql: NONREACTIVE

## 2024-11-02 LAB — AMMONIA: Ammonia: <13 umol/L (ref 9–35)

## 2024-11-02 LAB — RETICULOCYTES
Immature Retic Fract: 13.8 % (ref 2.3–15.9)
RBC.: 3.82 MIL/uL — ABNORMAL LOW (ref 4.22–5.81)
Retic Count, Absolute: 32.1 K/uL (ref 19.0–186.0)
Retic Ct Pct: 0.8 % (ref 0.4–3.1)

## 2024-11-02 LAB — ECHOCARDIOGRAM COMPLETE
Area-P 1/2: 4.18 cm2
Calc EF: 38 %
Height: 60 in
S' Lateral: 2.8 cm
Single Plane A2C EF: 45.7 %
Single Plane A4C EF: 31.9 %
Weight: 2539.7 [oz_av]

## 2024-11-02 LAB — TSH: TSH: 4.17 u[IU]/mL (ref 0.350–4.500)

## 2024-11-02 LAB — FERRITIN: Ferritin: 156 ng/mL (ref 24–336)

## 2024-11-02 LAB — I-STAT CG4 LACTIC ACID, ED: Lactic Acid, Venous: 1.8 mmol/L (ref 0.5–1.9)

## 2024-11-02 LAB — HIV ANTIBODY (ROUTINE TESTING W REFLEX): HIV Screen 4th Generation wRfx: NONREACTIVE

## 2024-11-02 MED ORDER — INSULIN ASPART 100 UNIT/ML IJ SOLN
0.0000 [IU] | INTRAMUSCULAR | Status: DC
  Administered 2024-11-02: 1 [IU] via SUBCUTANEOUS
  Administered 2024-11-02: 2 [IU] via SUBCUTANEOUS
  Administered 2024-11-03 (×2): 4 [IU] via SUBCUTANEOUS
  Administered 2024-11-03 (×3): 2 [IU] via SUBCUTANEOUS
  Administered 2024-11-04: 5 [IU] via SUBCUTANEOUS
  Administered 2024-11-04: 4 [IU] via SUBCUTANEOUS
  Administered 2024-11-04: 2 [IU] via SUBCUTANEOUS
  Administered 2024-11-04 – 2024-11-05 (×2): 4 [IU] via SUBCUTANEOUS
  Administered 2024-11-05: 3 [IU] via SUBCUTANEOUS
  Administered 2024-11-05: 6 [IU] via SUBCUTANEOUS
  Administered 2024-11-06: 4 [IU] via SUBCUTANEOUS
  Filled 2024-11-02: qty 4
  Filled 2024-11-02: qty 6
  Filled 2024-11-02: qty 2
  Filled 2024-11-02 (×2): qty 3
  Filled 2024-11-02: qty 5
  Filled 2024-11-02: qty 4
  Filled 2024-11-02: qty 2
  Filled 2024-11-02 (×2): qty 4
  Filled 2024-11-02: qty 2

## 2024-11-02 MED ORDER — IPRATROPIUM-ALBUTEROL 0.5-2.5 (3) MG/3ML IN SOLN: 3.0000 mL | RESPIRATORY_TRACT | Status: DC | PRN

## 2024-11-02 MED ORDER — VITAMIN B-12 1000 MCG PO TABS
1000.0000 ug | ORAL_TABLET | Freq: Every day | ORAL | Status: DC
  Administered 2024-11-02 – 2024-11-06 (×5): 1000 ug via ORAL
  Filled 2024-11-02 (×5): qty 1

## 2024-11-02 MED ORDER — SODIUM CHLORIDE 0.9 % IV SOLN
1.0000 g | INTRAVENOUS | Status: DC
  Administered 2024-11-02: 1 g via INTRAVENOUS
  Filled 2024-11-02: qty 10

## 2024-11-02 MED ORDER — FERROUS SULFATE 325 (65 FE) MG PO TABS
325.0000 mg | ORAL_TABLET | Freq: Every day | ORAL | Status: DC
  Administered 2024-11-02 – 2024-11-06 (×5): 325 mg via ORAL
  Filled 2024-11-02 (×7): qty 1

## 2024-11-02 MED ORDER — SENNOSIDES-DOCUSATE SODIUM 8.6-50 MG PO TABS
2.0000 | ORAL_TABLET | Freq: Every day | ORAL | Status: DC
  Administered 2024-11-02 – 2024-11-05 (×3): 2 via ORAL
  Filled 2024-11-02 (×3): qty 2

## 2024-11-02 MED ORDER — GLUCAGON HCL RDNA (DIAGNOSTIC) 1 MG IJ SOLR
1.0000 mg | INTRAMUSCULAR | Status: AC | PRN
  Administered 2024-11-06: 1 mg via INTRAVENOUS
  Filled 2024-11-02: qty 1

## 2024-11-02 MED ORDER — FUROSEMIDE 10 MG/ML IJ SOLN
40.0000 mg | Freq: Two times a day (BID) | INTRAMUSCULAR | Status: DC
  Administered 2024-11-02 – 2024-11-06 (×7): 40 mg via INTRAVENOUS
  Filled 2024-11-02 (×8): qty 4

## 2024-11-02 MED ORDER — METOPROLOL TARTRATE 5 MG/5ML IV SOLN: 5.0000 mg | INTRAVENOUS | Status: DC | PRN

## 2024-11-02 MED ORDER — SODIUM CHLORIDE 0.9 % IV SOLN
100.0000 mg | Freq: Two times a day (BID) | INTRAVENOUS | Status: DC
  Administered 2024-11-02 – 2024-11-03 (×3): 100 mg via INTRAVENOUS
  Filled 2024-11-02 (×2): qty 100

## 2024-11-02 MED ORDER — PERFLUTREN LIPID MICROSPHERE
1.0000 mL | INTRAVENOUS | Status: AC | PRN
  Administered 2024-11-02: 2 mL via INTRAVENOUS

## 2024-11-02 MED ORDER — HYDRALAZINE HCL 20 MG/ML IJ SOLN: 10.0000 mg | INTRAMUSCULAR | Status: DC | PRN

## 2024-11-02 NOTE — ED Notes (Signed)
 Report given to [facility]

## 2024-11-02 NOTE — ED Notes (Signed)
 Pt sat up in bed to take medication. Medications given and pt is now eating dinner.

## 2024-11-02 NOTE — ED Notes (Signed)
 Pt incontinent, changed pt and is clean and dry.

## 2024-11-02 NOTE — Progress Notes (Signed)
 Progress Note  Patient Name: Andre Stout Date of Encounter: 11/02/2024  Primary Cardiologist: Darryle ONEIDA Decent, MD   Subjective   Patient seen and examined at his bedside. Appears confused but awake, alert and only oriented to self. He offers no complaints at this time.  Inpatient Medications    Scheduled Meds:  amLODipine   10 mg Oral Daily   aspirin  EC  81 mg Oral Daily   atorvastatin   40 mg Oral Daily   ferrous sulfate  325 mg Oral Q breakfast   heparin  5,000 Units Subcutaneous Q8H   insulin  aspart  0-6 Units Subcutaneous Q4H   magnesium  oxide  400 mg Oral Daily   pantoprazole   40 mg Oral Daily   senna-docusate  2 tablet Oral QHS   Continuous Infusions:  cefTRIAXone  (ROCEPHIN )  IV     doxycycline  (VIBRAMYCIN ) IV     PRN Meds: acetaminophen  **OR** acetaminophen , glucagon (human recombinant), hydrALAZINE , ipratropium-albuterol , metoprolol  tartrate   Vital Signs    Vitals:   11/02/24 0507 11/02/24 0615 11/02/24 0645 11/02/24 0740  BP:  125/66 122/71 113/74  Pulse:  65 64 68  Resp:  19 14 15   Temp: 98.2 F (36.8 C)   97.8 F (36.6 C)  TempSrc: Oral   Oral  SpO2:  98% 97% 98%  Weight:      Height:       No intake or output data in the 24 hours ending 11/02/24 0920 Filed Weights   11/01/24 1745  Weight: 72 kg    Telemetry     - Personally Reviewed  ECG     - Personally Reviewed  Physical Exam    General: Comfortable Head: Atraumatic, normal size  Eyes: PEERLA, EOMI  Neck: Supple, normal JVD Cardiac: Normal S1, S2; RRR; no murmurs, rubs, or gallops Lungs: Clear to auscultation bilaterally Abd: Soft, nontender, no hepatomegaly  Ext: warm, no edema Musculoskeletal: No deformities, BUE and BLE strength normal and equal Skin: Warm and dry, no rashes   Neuro: Alert and oriented to person, place, time, and situation, CNII-XII grossly intact, no focal deficits  Psych: Normal mood and affect   Labs    Chemistry Recent Labs  Lab 11/01/24 1830  11/01/24 2136 11/02/24 0416  NA 127* 128* 133*  K 5.4* 4.8 4.2  CL 94* 96* 103  CO2 20* 19* 20*  GLUCOSE 350* 288* 76  BUN 50* 48* 43*  CREATININE 2.29* 2.26* 2.04*  CALCIUM  8.8* 8.2* 7.9*  PROT 6.9  --  6.0*  ALBUMIN  2.9*  --  2.5*  AST 15  --  13*  ALT 12  --  10  ALKPHOS 78  --  67  BILITOT 0.9  --  0.4  GFRNONAA 28* 28* 32*  ANIONGAP 13 13 10      Hematology Recent Labs  Lab 11/01/24 1830 11/01/24 2136 11/02/24 0416 11/02/24 0845  WBC 8.0 7.7 6.9  --   RBC 4.28 3.92* 3.61* 3.82*  HGB 10.7* 9.8* 9.0*  --   HCT 34.3* 31.2* 28.6*  --   MCV 80.1 79.6* 79.2*  --   MCH 25.0* 25.0* 24.9*  --   MCHC 31.2 31.4 31.5  --   RDW 16.7* 16.8* 16.7*  --   PLT 397 367 380  --     Cardiac EnzymesNo results for input(s): TROPONINI in the last 168 hours. No results for input(s): TROPIPOC in the last 168 hours.   BNP Recent Labs  Lab 11/01/24 1830  BNP 2,276.0*  DDimer No results for input(s): DDIMER in the last 168 hours.   Radiology    CT CHEST ABDOMEN PELVIS WO CONTRAST Result Date: 11/01/2024 EXAM: CT CHEST, ABDOMEN AND PELVIS WITHOUT CONTRAST 11/01/2024 08:09:41 PM TECHNIQUE: CT of the chest, abdomen and pelvis was performed without the administration of intravenous contrast. Multiplanar reformatted images are provided for review. Automated exposure control, iterative reconstruction, and/or weight based adjustment of the mA/kV was utilized to reduce the radiation dose to as low as reasonably achievable. COMPARISON: 09/02/2024 CLINICAL HISTORY: Sepsis. FINDINGS: CHEST: MEDIASTINUM AND LYMPH NODES: Mild cardiomegaly. Coronary artery and aortic atherosclerosis. The central airways are clear. No mediastinal, hilar or axillary lymphadenopathy. LUNGS AND PLEURA: Moderate to large bilateral pleural effusions. Compressive atelectasis in the lower lobes bilaterally. Scattered ground glass opacities throughout the lungs may reflect early edema. No pneumothorax. ABDOMEN AND  PELVIS: LIVER: The liver is unremarkable. GALLBLADDER AND BILE DUCTS: Small layering gallstones within the gallbladder. No biliary ductal dilatation. SPLEEN: No acute abnormality. PANCREAS: No acute abnormality. ADRENAL GLANDS: No acute abnormality. KIDNEYS, URETERS AND BLADDER: Stable 11 cm cyst in the mid to lower pole of the right kidney. No stones in the kidneys or ureters. No hydronephrosis. No perinephric or periureteral stranding. Urinary bladder is unremarkable. GI AND BOWEL: Stomach demonstrates no acute abnormality. There is no bowel obstruction. REPRODUCTIVE ORGANS: Radiation seeds in the region of the prostate. PERITONEUM AND RETROPERITONEUM: No ascites. No free air. VASCULATURE: Aorta is normal in caliber. Aortic atherosclerosis. ABDOMINAL AND PELVIS LYMPH NODES: No lymphadenopathy. BONES AND SOFT TISSUES: Severe chronic compression fracture at L1, unchanged. No focal soft tissue abnormality. IMPRESSION: 1. Moderate to large bilateral pleural effusions with compressive atelectasis in the lower lobes bilaterally. 2. Scattered ground-glass opacities throughout the lungs, possibly reflecting early edema. 3. Mild cardiomegaly. 4. Cholelithiasis. 5. No acute findings in the abdomen or pelvis. Electronically signed by: Franky Crease MD 11/01/2024 08:18 PM EST RP Workstation: [id number]   CT Head Wo Contrast Result Date: 11/01/2024 EXAM: CT HEAD WITHOUT CONTRAST 11/01/2024 08:09:41 PM TECHNIQUE: CT of the head was performed without the administration of intravenous contrast. Automated exposure control, iterative reconstruction, and/or weight based adjustment of the mA/kV was utilized to reduce the radiation dose to as low as reasonably achievable. COMPARISON: CT 09/02/2024 CLINICAL HISTORY: Mental status change, unknown cause. FINDINGS: BRAIN AND VENTRICLES: No acute hemorrhage. No evidence of acute infarct. No hydrocephalus. No extra-axial collection. No mass effect or midline shift. Diffuse cerebral and  cerebellar atrophy. Patchy and confluent areas of decreased attenuation are noted throughout the deep and periventricular white matter of the cerebral hemispheres bilaterally, suggestive of chronic microvascular ischemic changes. Atherosclerotic calcifications are present within the cavernous internal carotid and vertebral arteries. ORBITS: No acute abnormality. Bilateral lens replacement. SINUSES: No acute abnormality. SOFT TISSUES AND SKULL: Trace right posterior scalp hematoma (7.36). No skull fracture. IMPRESSION: 1. No acute intracranial abnormality. Electronically signed by: Morgane Naveau MD 11/01/2024 08:16 PM EST RP Workstation: [id number]   DG Chest Port 1 View Result Date: 11/01/2024 EXAM: 1 VIEW(S) XRAY OF THE CHEST 11/01/2024 06:40:00 PM COMPARISON: 09/02/2024 CLINICAL HISTORY: Questionable sepsis - evaluate for abnormality. FINDINGS: LUNGS AND PLEURA: Consolidation in the left lower lobe concerning for pneumonia. Possible small left effusion. No pulmonary edema. No pneumothorax. HEART AND MEDIASTINUM: Heart is borderline in size. Aortic atherosclerosis. BONES AND SOFT TISSUES: No acute osseous abnormality. IMPRESSION: 1. Left lower lobe consolidation concerning for pneumonia. 2. Possible small left pleural effusion. Electronically signed by: Franky Crease MD 11/01/2024 06:50  PM EST RP Workstation: [id number]    Cardiac Studies     Patient Profile     84 y.o. male with hx of   Assessment & Plan    Lactic acidosis  NSTEMI Chronic microcytic anemia  Pleural effusion bilaterally Multifocal pneumonia History of intermittent complete heart block Diabetes mellitus Hypertension Hyperlipidemia  Clinically no significant fluid overload but yet does have bilateral pleural effusions.  Has had some IV Lasix we will continue to monitor this. In terms of his troponin trending down denies any significant elevated troponin which is trending down, denies any significanc elevated troponin which  is trending down - denies any significant anginal symptoms. Echo done my quick review with wall motion abnormalities - discuss with daughter and patient for now they prefer medical management not invasive procedures. I do not believe this is unreasonable. But but in the setting of his anemia and suspected hemoglobin drop repeating cbc. If no further drop can consider use of heparin gtt.  For now Agree with start of aspirin  and continue statin   Will make adjustments based on full echo report for use of GDMT.   Noted CKD - avoid nephrotoxins.   Abx per primary team     For questions or updates, please contact [facility] Please consult [email or web address] for contact info under Cardiology/STEMI.      Signed, Seven Dollens, DO  11/02/2024, 9:20 AM

## 2024-11-02 NOTE — ED Notes (Signed)
 Notified provider Caleen regarding patient assessment findings.

## 2024-11-02 NOTE — Inpatient Diabetes Management (Signed)
 Inpatient Diabetes Program Recommendations  AACE/ADA: New Consensus Statement on Inpatient Glycemic Control (2015)  Target Ranges:  Prepandial:   less than 140 mg/dL      Peak postprandial:   less than 180 mg/dL (1-2 hours)      Critically ill patients:  140 - 180 mg/dL   Lab Results  Component Value Date   GLUCAP 73 11/02/2024   HGBA1C 10.7 (H) 07/21/2024    Latest Reference Range & Units 11/01/24 17:43 11/01/24 21:36 11/02/24 01:20 11/02/24 04:12 11/02/24 07:37  Glucose-Capillary 70 - 99 mg/dL 691 (H) 738 (H) Novolog  3 units 163 (H) Novolog  1 unit 82 73  (H): Data is abnormally high   Diabetes history: DM2 Outpatient Diabetes medications:  Lantus  15 units daily 70/30 5 units tid meal coverage Metformin  500 mg  Current orders for Inpatient glycemic control: Novolog  0-6 units q 4 hrs.  Inpatient Diabetes Program Recommendations:   Currently in ED. Patient sees Dr. Lela Fendt for endocrinology with last video visit 10/27/24. Patient's appetite has been poor. Will follow and assist as needed during hospitalization. Patient will most likely need insulin  adjustment prior to discharge.  Thank you, Langston Summerfield E. Miriya Cloer, RN, MSN, CNS, CDCES  Diabetes Coordinator Inpatient Glycemic Control Team Team Pager [phone] (8am-5pm) 11/02/2024 9:13 AM

## 2024-11-02 NOTE — Evaluation (Signed)
 Occupational Therapy Evaluation Patient Details Name: Andre Stout MRN: [id number] DOB: 04-Jan-1940 Today's Date: 11/02/2024   History of Present Illness   Pt is a 84 y.o. male presenting on 11/2 with AMS from home. Workup in the ER revealed large bilateral pleural effusion concerning for CHF exacerbation, AKI, hyponatremia, elevated BNP, troponin and lactic acidosis. Multiple prior admissions recently. PMH: AV block, hypertension, type 2 diabetes, prostate cancer, glaucoma, HTNm, Cholecystitis s/p cholecystostomy in 07/2022.     Clinical Impressions Pt c/o of fatigue, but agreeable to session, no c/o dizziness or pain throughout session. Pt poor historian, inconsistent with history, gathered from prior admissions. Pt came to ED from home, lives with daughters per notes, recent prior SNF admissions. Pt currently requires significant assistance for transfers, mod A with RW, poor balance with frequent LOB static standing, max A for LB ADLs and toileting, min A for UB ADLs. Pt oriented to self and location only. Pt would benefit from continued acute OT to maximize participation in ADLs, DC to postacute rehab <3hrs/day recommended unless Pt has 24/7 support at home to provide level of assistance needed to remain safe with transfers and assistance with toileting hygiene multiple times a day for general health/wellbeing and quality of life. Unsure of DME needs at home.      If plan is discharge home, recommend the following:   A lot of help with walking and/or transfers;A lot of help with bathing/dressing/bathroom;Assistance with cooking/housework;Assist for transportation;Help with stairs or ramp for entrance     Functional Status Assessment   Patient has had a recent decline in their functional status and demonstrates the ability to make significant improvements in function in a reasonable and predictable amount of time.     Equipment Recommendations   Other (comment) (TBD)      Recommendations for Other Services         Precautions/Restrictions   Precautions Precautions: Fall Recall of Precautions/Restrictions: Impaired Restrictions Weight Bearing Restrictions Per Provider Order: No     Mobility Bed Mobility Overal bed mobility: Needs Assistance Bed Mobility: Supine to Sit, Sit to Supine     Supine to sit: Mod assist, HOB elevated, Used rails Sit to supine: Min assist, Used rails   General bed mobility comments: mod A to assist to EOB, min A for return to supine.    Transfers Overall transfer level: Needs assistance Equipment used: Rolling walker (2 wheels) Transfers: Sit to/from Stand, Bed to chair/wheelchair/BSC Sit to Stand: Min assist, Mod assist     Step pivot transfers: Min assist, Mod assist     General transfer comment: min/mod A for transfer to recliner and back, poor balance with RW.      Balance Overall balance assessment: Needs assistance Sitting-balance support: No upper extremity supported, Feet supported Sitting balance-Leahy Scale: Fair     Standing balance support: Bilateral upper extremity supported, During functional activity, Reliant on assistive device for balance Standing balance-Leahy Scale: Poor Standing balance comment: reliant on RW for support, frequent LOB with static standing                           ADL either performed or assessed with clinical judgement   ADL Overall ADL's : Needs assistance/impaired Eating/Feeding: Set up;Sitting   Grooming: Set up;Sitting   Upper Body Bathing: Minimal assistance;Cueing for sequencing;Cueing for safety;Sitting   Lower Body Bathing: Maximal assistance;Sitting/lateral leans;Sit to/from stand   Upper Body Dressing : Minimal assistance;Sitting;Standing   Lower Body  Dressing: Maximal assistance;Sitting/lateral leans;Sit to/from stand   Toilet Transfer: Minimal assistance;Moderate assistance;Rolling walker (2 wheels);BSC/3in1   Toileting- Clothing  Manipulation and Hygiene: Maximal assistance;Sitting/lateral lean;Sit to/from stand         General ADL Comments: Pt min/mod A for trasnfer to recliner and back using RW, decreased safety awareness and poor balance, weak in BLEs. Pt min A for UB ADLs, cueing for safety and at times for sequencing.     Vision Baseline Vision/History: 0 No visual deficits Ability to See in Adequate Light: 0 Adequate Patient Visual Report: No change from baseline       Perception         Praxis         Pertinent Vitals/Pain       Extremity/Trunk Assessment Upper Extremity Assessment Upper Extremity Assessment: RUE deficits/detail;LUE deficits/detail RUE Deficits / Details: decreased ROM at B shoulders, overall WFLs, able to don/doff shirt, fair FM skills RUE Sensation: WNL RUE Coordination: WNL LUE Deficits / Details: decreased ROM at B shoulders, overall WFLs, able to don/doff shirt, fair FM skills LUE Sensation: WNL LUE Coordination: WNL           Communication Communication Communication: Impaired Factors Affecting Communication: Hearing impaired   Cognition Arousal: Alert Behavior During Therapy: WFL for tasks assessed/performed Cognition: Cognition impaired   Orientation impairments: Time, Situation         OT - Cognition Comments: Pt not oriented to situation or time, knows he is in hospital, oriented to self. Pt with multiple inconsistencies when providing history, history obtained from prior admissions                 Following commands: Impaired Following commands impaired: Follows one step commands inconsistently, Follows one step commands with increased time     Cueing  General Comments   Cueing Techniques: Verbal cues;Gestural cues      Exercises     Shoulder Instructions      Home Living Family/patient expects to be discharged to:: Private residence Living Arrangements: Children Available Help at Discharge: Family Type of Home: House Home  Access: Stairs to enter;Ramped entrance Entrance Stairs-Number of Steps: 5 - information taken from chart review as pt cannot state   Home Layout: One level     Bathroom Shower/Tub: Walk-in shower         Home Equipment: Agricultural Consultant (2 wheels);Wheelchair - Sport And Exercise Psychologist Comments: Patient unable to provide accurate history, obtained from prior admission      Prior Functioning/Environment Prior Level of Function : Patient poor historian/Family not available             Mobility Comments: poor historian, reports w/c for mobility, RW for very short distances ADLs Comments: likley needs help at baseline    OT Problem List: Decreased strength;Decreased range of motion;Decreased activity tolerance;Impaired balance (sitting and/or standing);Decreased cognition;Decreased safety awareness   OT Treatment/Interventions: Self-care/ADL training;Therapeutic exercise;Energy conservation;DME and/or AE instruction;Therapeutic activities;Patient/family education;Balance training      OT Goals(Current goals can be found in the care plan section)   Acute Rehab OT Goals Patient Stated Goal: to return home OT Goal Formulation: With patient Time For Goal Achievement: 11/16/24 Potential to Achieve Goals: Good   OT Frequency:  Min 2X/week    Co-evaluation              AM-PAC OT 6 Clicks Daily Activity     Outcome Measure Help from another person eating meals?: A Little Help from another person taking  care of personal grooming?: A Little Help from another person toileting, which includes using toliet, bedpan, or urinal?: A Lot Help from another person bathing (including washing, rinsing, drying)?: A Lot Help from another person to put on and taking off regular upper body clothing?: A Little Help from another person to put on and taking off regular lower body clothing?: A Lot 6 Click Score: 15   End of Session Equipment Utilized During Treatment: Gait  belt;Rolling walker (2 wheels) Nurse Communication: Mobility status  Activity Tolerance: Patient tolerated treatment well Patient left: in bed;with call bell/phone within reach  OT Visit Diagnosis: Unsteadiness on feet (R26.81);Other abnormalities of gait and mobility (R26.89);Muscle weakness (generalized) (M62.81);Other symptoms and signs involving cognitive function                Time: 1127-1215 OT Time Calculation (min): 48 min Charges:  OT General Charges $OT Visit: 1 Visit OT Evaluation $OT Eval Moderate Complexity: 1 Mod OT Treatments $Self Care/Home Management : 23-37 mins  [address], OTR/L   [name] 11/02/2024, 12:36 PM

## 2024-11-02 NOTE — ED Notes (Signed)
 Pt BG 73 - patient was helped with his breakfast tray set up and is currently eating.

## 2024-11-02 NOTE — ED Notes (Signed)
 CBG reads 239

## 2024-11-02 NOTE — ED Notes (Signed)
 Pt moved from hall into room. Placed on monitor. Food tray given. Offered food x3 but continued to sleep. Will offer again. Bed in lowest position. Wheels locked rails up x2. Call light and belongings within reach

## 2024-11-02 NOTE — Progress Notes (Signed)
 OT Cancellation Note  Patient Details Name: Andre Stout MRN: [id number] DOB: 12/21/1940   Cancelled Treatment:    Reason Eval/Treat Not Completed: (P) Patient at procedure or test/ unavailable, just started echo, will return as time allows  [name] 11/02/2024, 10:37 AM

## 2024-11-02 NOTE — Hospital Course (Addendum)
 Brief Narrative:  84 year old with history of DM2, HTN, chronic anemia, HLD, complete heart block, prostate cancer comes to the ED for poor p.o. intake and diarrhea.  Recently admitted in September for aspiration pneumonia.  Workup in the ER revealed large bilateral pleural effusion concerning for CHF exacerbation, AKI, hyponatremia, elevated BNP, troponin and lactic acidosis. Echocardiogram showed newly depressed EF of 35%.  Cardiology discussed with the patient and family and no further invasive procedures were planned.  He tolerated diuretics well during the hospitalization and did not have any signs of hypoxia or shortness of breath.  Today patient is medically stable.  Will add daily Lasix with potassium supplements with outpatient follow-up.  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  Does have signs of significant bilateral pleural effusion with some compressive atelectasis but fortunately he is comfortable on room air without any severe respiratory distress.  Seems to be tolerating diuretics well.  Symptoms are stable - I think we can hold off on thoracentesis given his hemodynamic stability  Acute kidney injury -Baseline creatinine 1.1, admission creatinine 2.29> 1.65 > 1.5  Multifocal pneumonia - Completed 5 days of Rocephin /doxycycline .  Continue bronchodilators as needed  Diarrhea/poor intake - Resolved  Vitamin B12 deficiency - Supplements  GPC bacteremia - Suspect contaminant   Hyponatremia, resolved -In the setting of volume overload.  Admission sodium 127 now slowly improving  History of intermittent complete heart block -Followed by cardiology  Anemia of chronic disease with microcytosis - Low iron saturation.  P.o. supplements with bowel regimen  Insulin -dependent diabetes mellitus type 2,  uncontrolled due to hyperglycemia -Sliding scale and Accu-Cheks  Hyperlipidemia -Statin  Essential hypertension -Norvasc .  IV as needed     DVT prophylaxis: SQ Hep    Code Status: Limited: Do not attempt resuscitation (DNR) -DNR-LIMITED -Do Not Intubate/DNI  Family Communication: Daughter updated.  Discharge today   PT Follow up Recs: Home Health Pt11/03/2024 1103; F2F complete  Subjective: Low blood glucose overnight as he had required significant amount of insulin  yesterday evening due to severe hyperglycemia.  Stable this morning.  Tolerating p.o.  No other complaints at this time Daughter updated No hypoxia with ambulation or shortness of breath  Examination:  General exam: Appears calm and comfortable  Respiratory system: Minimal basilar crackles Cardiovascular system: S1 & S2 heard, RRR. No JVD, murmurs, rubs, gallops or clicks. No pedal edema. Gastrointestinal system: Abdomen is nondistended, soft and nontender. No organomegaly or masses felt. Normal bowel sounds heard. Central nervous system: Alert and oriented. No focal neurological deficits. Extremities: Symmetric 5 x 5 power. Skin: No rashes, lesions or ulcers Psychiatry: Judgement and insight appear poor

## 2024-11-02 NOTE — Progress Notes (Signed)
 PHARMACY - PHYSICIAN COMMUNICATION CRITICAL VALUE ALERT - BLOOD CULTURE IDENTIFICATION (BCID)  Andre Stout is an 84 y.o. male who presented to [facility] on 11/01/2024 with a chief complaint of poor appetite and diarrhea.   Assessment:  84 year old male admitted with acute renal failure and possible pneumonia. Now with staph species in 1/4 blood cultures. Likely contaminant.   Name of physician (or Provider) ContactedBETHA Dare  Current antibiotics: ceftriaxone /doxy   Changes to prescribed antibiotics recommended:  None based on this blood culture  Results for orders placed or performed during the hospital encounter of 11/01/24  Blood Culture ID Panel (Reflexed) (Collected: 11/01/2024  6:30 PM)  Result Value Ref Range   Enterococcus faecalis NOT DETECTED NOT DETECTED   Enterococcus Faecium NOT DETECTED NOT DETECTED   Listeria monocytogenes NOT DETECTED NOT DETECTED   Staphylococcus species DETECTED (A) NOT DETECTED   Staphylococcus aureus (BCID) NOT DETECTED NOT DETECTED   Staphylococcus epidermidis NOT DETECTED NOT DETECTED   Staphylococcus lugdunensis NOT DETECTED NOT DETECTED   Streptococcus species NOT DETECTED NOT DETECTED   Streptococcus agalactiae NOT DETECTED NOT DETECTED   Streptococcus pneumoniae NOT DETECTED NOT DETECTED   Streptococcus pyogenes NOT DETECTED NOT DETECTED   A.calcoaceticus-baumannii NOT DETECTED NOT DETECTED   Bacteroides fragilis NOT DETECTED NOT DETECTED   Enterobacterales NOT DETECTED NOT DETECTED   Enterobacter cloacae complex NOT DETECTED NOT DETECTED   Escherichia coli NOT DETECTED NOT DETECTED   Klebsiella aerogenes NOT DETECTED NOT DETECTED   Klebsiella oxytoca NOT DETECTED NOT DETECTED   Klebsiella pneumoniae NOT DETECTED NOT DETECTED   Proteus species NOT DETECTED NOT DETECTED   Salmonella species NOT DETECTED NOT DETECTED   Serratia marcescens NOT DETECTED NOT DETECTED   Haemophilus influenzae NOT DETECTED NOT DETECTED   Neisseria  meningitidis NOT DETECTED NOT DETECTED   Pseudomonas aeruginosa NOT DETECTED NOT DETECTED   Stenotrophomonas maltophilia NOT DETECTED NOT DETECTED   Candida albicans NOT DETECTED NOT DETECTED   Candida auris NOT DETECTED NOT DETECTED   Candida glabrata NOT DETECTED NOT DETECTED   Candida krusei NOT DETECTED NOT DETECTED   Candida parapsilosis NOT DETECTED NOT DETECTED   Candida tropicalis NOT DETECTED NOT DETECTED   Cryptococcus neoformans/gattii NOT DETECTED NOT DETECTED    Damien Quiet, PharmD, BCPS, BCIDP Infectious Diseases Clinical Pharmacist Phone: [phone] 11/02/2024  1:11 PM

## 2024-11-02 NOTE — Progress Notes (Signed)
 PROGRESS NOTE    Andre Stout  FMW:[id number] DOB: 15-Dec-1940 DOA: 11/01/2024 PCP: Georgina Leita Norris, DO    Brief Narrative:  84 year old with history of DM2, HTN, chronic anemia, HLD, complete heart block, prostate cancer comes to the ED for poor p.o. intake and diarrhea.  Recently admitted in September for aspiration pneumonia.  Workup in the ER revealed large bilateral pleural effusion concerning for CHF exacerbation, AKI, hyponatremia, elevated BNP, troponin and lactic acidosis.   Assessment & Plan:  Acute CHF exacerbation with preserved EF, 65% Bilateral pleural effusion, large with compressive atelectasis Lactic acidosis NSTEMI - Does have some signs of volume overload but no evidence of overt hypoxia or respiratory failure at this time.  He also denies any chest pain, EKG showing some T wave inversion.  Patient has been seen by cardiology and at this time plan is to obtain echocardiogram.  Comfortable at rest on room air but due to signs of volume overload given Lasix 40 mg IV twice daily -Thoracentesis if necessary -Troponins are slowly trending down, CK levels are normal  Acute kidney injury -Baseline creatinine 1.1, admission creatinine 2.29  Diarrhea/poor intake -Will check stool studies  Vitamin B12 deficiency - Supplements  Multifocal pneumonia - Elevated procalcitonin, will continue Rocephin  and azithromycin  Hyponatremia -In the setting of volume overload.  Admission sodium 127 now slowly improving  History of intermittent complete heart block -Followed by cardiology  Anemia of chronic disease with microcytosis - Low iron saturation.  P.o. 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  IV as needed     DVT prophylaxis: heparin injection 5,000 Units Start: 11/01/24 2200      Code Status:  Limited: Do not attempt resuscitation (DNR) -DNR-LIMITED -Do Not Intubate/DNI  Family Communication:   Ongoing diuresis   PT Follow up Recs:   Subjective: Seen at bedside resting comfortably.  Pleasantly confused and answers very basic questions denies any shortness of breath or chest pain   Examination:  General exam: Appears calm and comfortable  Respiratory system: T diminished breath sounds at the bases Cardiovascular system: S1 & S2 heard, RRR. No JVD, murmurs, rubs, gallops or clicks. No pedal edema. Gastrointestinal system: Abdomen is nondistended, soft and nontender. No organomegaly or masses felt. Normal bowel sounds heard. Central nervous system: Alert and oriented. No focal neurological deficits. Extremities: Symmetric 5 x 5 power. Skin: No rashes, lesions or ulcers Psychiatry: Judgement and insight appear poor                Diet Orders (From admission, onward)     Start     Ordered   11/01/24 2121  DIET DYS 2 Room service appropriate? Yes; Fluid consistency: Thin  Diet effective now       Question Answer Comment  Room service appropriate? Yes   Fluid consistency: Thin      11/01/24 2121            Objective: Vitals:   11/02/24 0615 11/02/24 0645 11/02/24 0740 11/02/24 1000  BP: 125/66 122/71 113/74 (!) 113/56  Pulse: 65 64 68 62  Resp: 19 14 15 16   Temp:   97.8 F (36.6 C) 98 F (36.7 C)  TempSrc:   Oral Oral  SpO2: 98% 97% 98% 95%  Weight:      Height:       No intake or output data in the 24 hours ending  11/02/24 1147 Filed Weights   11/01/24 1745  Weight: 72 kg    Scheduled Meds:  amLODipine   10 mg Oral Daily   aspirin  EC  81 mg Oral Daily   atorvastatin   40 mg Oral Daily   vitamin B-12  1,000 mcg Oral Daily   ferrous sulfate  325 mg Oral Q breakfast   furosemide  40 mg Intravenous Q12H   heparin  5,000 Units Subcutaneous Q8H   insulin  aspart  0-6 Units Subcutaneous Q4H   magnesium  oxide  400 mg Oral Daily   pantoprazole    40 mg Oral Daily   senna-docusate  2 tablet Oral QHS   Continuous Infusions:  cefTRIAXone  (ROCEPHIN )  IV     doxycycline  (VIBRAMYCIN ) IV 100 mg (11/02/24 1007)    Nutritional status     Body mass index is 31 kg/m.  Data Reviewed:   CBC: Recent Labs  Lab 11/01/24 1825 11/01/24 1830 11/01/24 2136 11/02/24 0416  WBC  --  8.0 7.7 6.9  NEUTROABS  --  6.2  --  4.2  HGB 12.9* 10.7* 9.8* 9.0*  HCT 38.0* 34.3* 31.2* 28.6*  MCV  --  80.1 79.6* 79.2*  PLT  --  397 367 380   Basic Metabolic Panel: Recent Labs  Lab 11/01/24 1825 11/01/24 1830 11/01/24 2136 11/02/24 0416  NA 128* 127* 128* 133*  K 5.4* 5.4* 4.8 4.2  CL  --  94* 96* 103  CO2  --  20* 19* 20*  GLUCOSE  --  350* 288* 76  BUN  --  50* 48* 43*  CREATININE  --  2.29* 2.26* 2.04*  CALCIUM   --  8.8* 8.2* 7.9*  MG  --   --  2.2  --    GFR: Estimated Creatinine Clearance: 22.8 mL/min (A) (by C-G formula based on SCr of 2.04 mg/dL (H)). Liver Function Tests: Recent Labs  Lab 11/01/24 1830 11/02/24 0416  AST 15 13*  ALT 12 10  ALKPHOS 78 67  BILITOT 0.9 0.4  PROT 6.9 6.0*  ALBUMIN  2.9* 2.5*   No results for input(s): LIPASE, AMYLASE in the last 168 hours. Recent Labs  Lab 11/02/24 0845  AMMONIA <13   Coagulation Profile: Recent Labs  Lab 11/01/24 1830  INR 1.2   Cardiac Enzymes: Recent Labs  Lab 11/01/24 2136  CKTOTAL 37*   BNP (last 3 results) No results for input(s): PROBNP in the last 8760 hours. HbA1C: No results for input(s): HGBA1C in the last 72 hours. CBG: Recent Labs  Lab 11/01/24 1743 11/01/24 2136 11/02/24 0120 11/02/24 0412 11/02/24 0737  GLUCAP 308* 261* 163* 82 73   Lipid Profile: No results for input(s): CHOL, HDL, LDLCALC, TRIG, CHOLHDL, LDLDIRECT in the last 72 hours. Thyroid  Function Tests: Recent Labs    11/02/24 0845  TSH 4.170   Anemia Panel: Recent Labs    11/01/24 2136 11/02/24 0845  VITAMINB12  --  <150*  FOLATE  --  8.0   FERRITIN 156  --   TIBC 217*  --   IRON 28*  --   RETICCTPCT  --  0.8   Sepsis Labs: Recent Labs  Lab 11/01/24 1826 11/01/24 2023 11/02/24 0845 11/02/24 0859  PROCALCITON  --   --  5.63  --   LATICACIDVEN 3.6* 2.9*  --  1.8    Recent Results (from the past 240 hours)  Resp panel by RT-PCR (RSV, Flu A&B, Covid) Anterior Nasal Swab     Status: None   Collection Time:  11/01/24  5:50 PM   Specimen: Anterior Nasal Swab  Result Value Ref Range Status   SARS Coronavirus 2 by RT PCR NEGATIVE NEGATIVE Final   Influenza A by PCR NEGATIVE NEGATIVE Final   Influenza B by PCR NEGATIVE NEGATIVE Final    Comment: (NOTE) The Xpert Xpress SARS-CoV-2/FLU/RSV plus assay is intended as an aid in the diagnosis of influenza from Nasopharyngeal swab specimens and should not be used as a sole basis for treatment. Nasal washings and aspirates are unacceptable for Xpert Xpress SARS-CoV-2/FLU/RSV testing.  Fact Sheet for Patients: bloggercourse.com  Fact Sheet for Healthcare Providers: seriousbroker.it  This test is not yet approved or cleared by the [place]  FDA and has been authorized for detection and/or diagnosis of SARS-CoV-2 by FDA under an Emergency Use Authorization (EUA). This EUA will remain in effect (meaning this test can be used) for the duration of the COVID-19 declaration under Section 564(b)(1) of the Act, 21 U.S.C. section 360bbb-3(b)(1), unless the authorization is terminated or revoked.     Resp Syncytial Virus by PCR NEGATIVE NEGATIVE Final    Comment: (NOTE) Fact Sheet for Patients: bloggercourse.com  Fact Sheet for Healthcare Providers: seriousbroker.it  This test is not yet approved or cleared by the [place]  FDA and has been authorized for detection and/or diagnosis of SARS-CoV-2 by FDA under an Emergency Use Authorization (EUA). This EUA will remain in  effect (meaning this test can be used) for the duration of the COVID-19 declaration under Section 564(b)(1) of the Act, 21 U.S.C. section 360bbb-3(b)(1), unless the authorization is terminated or revoked.  Performed at [facility], [address] [place], [place] [address]   Blood Culture (routine x 2)     Status: None (Preliminary result)   Collection Time: 11/01/24  5:55 PM   Specimen: BLOOD  Result Value Ref Range Status   Specimen Description BLOOD LEFT ANTECUBITAL  Final   Special Requests   Final    BOTTLES DRAWN AEROBIC AND ANAEROBIC Blood Culture results may not be optimal due to an inadequate volume of blood received in culture bottles   Culture   Final    NO GROWTH < 12 HOURS Performed at [facility], [address] [place], [place] [address]    Report Status PENDING  Incomplete  Blood Culture (routine x 2)     Status: None (Preliminary result)   Collection Time: 11/01/24  6:30 PM   Specimen: BLOOD LEFT FOREARM  Result Value Ref Range Status   Specimen Description BLOOD LEFT FOREARM  Final   Special Requests   Final    BOTTLES DRAWN AEROBIC AND ANAEROBIC Blood Culture results may not be optimal due to an inadequate volume of blood received in culture bottles   Culture  Setup Time   Final    GRAM POSITIVE COCCI IN CLUSTERS AEROBIC BOTTLE ONLY Organism ID to follow Performed at [facility], [address] [place], [place] [address]    Culture GRAM POSITIVE COCCI  Final   Report Status PENDING  Incomplete         Radiology Studies: CT CHEST ABDOMEN PELVIS WO CONTRAST Result Date: 11/01/2024 EXAM: CT CHEST, ABDOMEN AND PELVIS WITHOUT CONTRAST 11/01/2024 08:09:41 PM TECHNIQUE: CT of the chest, abdomen and pelvis was performed without the administration of intravenous contrast. Multiplanar reformatted images are provided for review. Automated exposure control, iterative reconstruction, and/or weight based adjustment of the mA/kV was utilized to  reduce the radiation dose to as low  as reasonably achievable. COMPARISON: 09/02/2024 CLINICAL HISTORY: Sepsis. FINDINGS: CHEST: MEDIASTINUM AND LYMPH NODES: Mild cardiomegaly. Coronary artery and aortic atherosclerosis. The central airways are clear. No mediastinal, hilar or axillary lymphadenopathy. LUNGS AND PLEURA: Moderate to large bilateral pleural effusions. Compressive atelectasis in the lower lobes bilaterally. Scattered ground glass opacities throughout the lungs may reflect early edema. No pneumothorax. ABDOMEN AND PELVIS: LIVER: The liver is unremarkable. GALLBLADDER AND BILE DUCTS: Small layering gallstones within the gallbladder. No biliary ductal dilatation. SPLEEN: No acute abnormality. PANCREAS: No acute abnormality. ADRENAL GLANDS: No acute abnormality. KIDNEYS, URETERS AND BLADDER: Stable 11 cm cyst in the mid to lower pole of the right kidney. No stones in the kidneys or ureters. No hydronephrosis. No perinephric or periureteral stranding. Urinary bladder is unremarkable. GI AND BOWEL: Stomach demonstrates no acute abnormality. There is no bowel obstruction. REPRODUCTIVE ORGANS: Radiation seeds in the region of the prostate. PERITONEUM AND RETROPERITONEUM: No ascites. No free air. VASCULATURE: Aorta is normal in caliber. Aortic atherosclerosis. ABDOMINAL AND PELVIS LYMPH NODES: No lymphadenopathy. BONES AND SOFT TISSUES: Severe chronic compression fracture at L1, unchanged. No focal soft tissue abnormality. IMPRESSION: 1. Moderate to large bilateral pleural effusions with compressive atelectasis in the lower lobes bilaterally. 2. Scattered ground-glass opacities throughout the lungs, possibly reflecting early edema. 3. Mild cardiomegaly. 4. Cholelithiasis. 5. No acute findings in the abdomen or pelvis. Electronically signed by: Franky Crease MD 11/01/2024 08:18 PM EST RP Workstation: [id number]   CT Head Wo Contrast Result Date: 11/01/2024 EXAM: CT HEAD WITHOUT CONTRAST 11/01/2024 08:09:41 PM  TECHNIQUE: CT of the head was performed without the administration of intravenous contrast. Automated exposure control, iterative reconstruction, and/or weight based adjustment of the mA/kV was utilized to reduce the radiation dose to as low as reasonably achievable. COMPARISON: CT 09/02/2024 CLINICAL HISTORY: Mental status change, unknown cause. FINDINGS: BRAIN AND VENTRICLES: No acute hemorrhage. No evidence of acute infarct. No hydrocephalus. No extra-axial collection. No mass effect or midline shift. Diffuse cerebral and cerebellar atrophy. Patchy and confluent areas of decreased attenuation are noted throughout the deep and periventricular white matter of the cerebral hemispheres bilaterally, suggestive of chronic microvascular ischemic changes. Atherosclerotic calcifications are present within the cavernous internal carotid and vertebral arteries. ORBITS: No acute abnormality. Bilateral lens replacement. SINUSES: No acute abnormality. SOFT TISSUES AND SKULL: Trace right posterior scalp hematoma (7.36). No skull fracture. IMPRESSION: 1. No acute intracranial abnormality. Electronically signed by: Morgane Naveau MD 11/01/2024 08:16 PM EST RP Workstation: [id number]   DG Chest Port 1 View Result Date: 11/01/2024 EXAM: 1 VIEW(S) XRAY OF THE CHEST 11/01/2024 06:40:00 PM COMPARISON: 09/02/2024 CLINICAL HISTORY: Questionable sepsis - evaluate for abnormality. FINDINGS: LUNGS AND PLEURA: Consolidation in the left lower lobe concerning for pneumonia. Possible small left effusion. No pulmonary edema. No pneumothorax. HEART AND MEDIASTINUM: Heart is borderline in size. Aortic atherosclerosis. BONES AND SOFT TISSUES: No acute osseous abnormality. IMPRESSION: 1. Left lower lobe consolidation concerning for pneumonia. 2. Possible small left pleural effusion. Electronically signed by: Franky Crease MD 11/01/2024 06:50 PM EST RP Workstation: [id number]           LOS: 1 day   Time spent= 35 mins    Burgess JAYSON Dare, MD [facility]  If 7PM-7AM, please contact night-coverage  11/02/2024, 11:47 AM

## 2024-11-03 DIAGNOSIS — N170 Acute kidney failure with tubular necrosis: Secondary | ICD-10-CM | POA: Diagnosis not present

## 2024-11-03 DIAGNOSIS — E785 Hyperlipidemia, unspecified: Secondary | ICD-10-CM | POA: Diagnosis not present

## 2024-11-03 DIAGNOSIS — I10 Essential (primary) hypertension: Secondary | ICD-10-CM | POA: Diagnosis not present

## 2024-11-03 DIAGNOSIS — I5033 Acute on chronic diastolic (congestive) heart failure: Secondary | ICD-10-CM | POA: Diagnosis not present

## 2024-11-03 LAB — CBC
HCT: 29.9 % — ABNORMAL LOW (ref 39.0–52.0)
Hemoglobin: 9.6 g/dL — ABNORMAL LOW (ref 13.0–17.0)
MCH: 25.4 pg — ABNORMAL LOW (ref 26.0–34.0)
MCHC: 32.1 g/dL (ref 30.0–36.0)
MCV: 79.1 fL — ABNORMAL LOW (ref 80.0–100.0)
Platelets: 368 K/uL (ref 150–400)
RBC: 3.78 MIL/uL — ABNORMAL LOW (ref 4.22–5.81)
RDW: 16.9 % — ABNORMAL HIGH (ref 11.5–15.5)
WBC: 6.6 K/uL (ref 4.0–10.5)
nRBC: 0 % (ref 0.0–0.2)

## 2024-11-03 LAB — BASIC METABOLIC PANEL WITH GFR
Anion gap: 15 (ref 5–15)
Anion gap: 15 (ref 5–15)
BUN: 41 mg/dL — ABNORMAL HIGH (ref 8–23)
BUN: 42 mg/dL — ABNORMAL HIGH (ref 8–23)
CO2: 22 mmol/L (ref 22–32)
CO2: 20 mmol/L — ABNORMAL LOW (ref 22–32)
Calcium: 8.9 mg/dL (ref 8.9–10.3)
Calcium: 8.8 mg/dL — ABNORMAL LOW (ref 8.9–10.3)
Chloride: 97 mmol/L — ABNORMAL LOW (ref 98–111)
Chloride: 98 mmol/L (ref 98–111)
Creatinine, Ser: 1.94 mg/dL — ABNORMAL HIGH (ref 0.61–1.24)
Creatinine, Ser: 1.88 mg/dL — ABNORMAL HIGH (ref 0.61–1.24)
GFR, Estimated: 34 mL/min — ABNORMAL LOW (ref >60)
GFR, Estimated: 35 mL/min — ABNORMAL LOW (ref >60)
Glucose, Bld: 260 mg/dL — ABNORMAL HIGH (ref 70–99)
Glucose, Bld: 217 mg/dL — ABNORMAL HIGH (ref 70–99)
Potassium: 4.4 mmol/L (ref 3.5–5.1)
Potassium: 4.4 mmol/L (ref 3.5–5.1)
Sodium: 134 mmol/L — ABNORMAL LOW (ref 135–145)
Sodium: 133 mmol/L — ABNORMAL LOW (ref 135–145)

## 2024-11-03 LAB — CULTURE, BLOOD (ROUTINE X 2)

## 2024-11-03 LAB — PHOSPHORUS: Phosphorus: 3.9 mg/dL (ref 2.5–4.6)

## 2024-11-03 LAB — GLUCOSE, CAPILLARY
Glucose-Capillary: 240 mg/dL — ABNORMAL HIGH (ref 70–99)
Glucose-Capillary: 226 mg/dL — ABNORMAL HIGH (ref 70–99)
Glucose-Capillary: 242 mg/dL — ABNORMAL HIGH (ref 70–99)
Glucose-Capillary: 328 mg/dL — ABNORMAL HIGH (ref 70–99)
Glucose-Capillary: 326 mg/dL — ABNORMAL HIGH (ref 70–99)

## 2024-11-03 LAB — MAGNESIUM: Magnesium: 2 mg/dL (ref 1.7–2.4)

## 2024-11-03 LAB — FOLATE: Folate: 8.7 ng/mL (ref >5.9)

## 2024-11-03 LAB — CBG MONITORING, ED: Glucose-Capillary: 258 mg/dL — ABNORMAL HIGH (ref 70–99)

## 2024-11-03 MED ORDER — INSULIN GLARGINE-YFGN 100 UNIT/ML ~~LOC~~ SOLN
5.0000 [IU] | Freq: Every day | SUBCUTANEOUS | Status: DC
  Administered 2024-11-03 – 2024-11-06 (×4): 5 [IU] via SUBCUTANEOUS
  Filled 2024-11-03 (×4): qty 0.05

## 2024-11-03 MED ORDER — DOXYCYCLINE HYCLATE 100 MG PO TABS
100.0000 mg | ORAL_TABLET | Freq: Two times a day (BID) | ORAL | Status: DC
Start: 2024-11-03 — End: 2024-11-06
  Administered 2024-11-03 – 2024-11-05 (×5): 100 mg via ORAL
  Filled 2024-11-03 (×5): qty 1

## 2024-11-03 MED ORDER — SODIUM CHLORIDE 0.9 % IV SOLN
2.0000 g | INTRAVENOUS | Status: DC
  Administered 2024-11-03 – 2024-11-05 (×3): 2 g via INTRAVENOUS
  Filled 2024-11-03 (×3): qty 20

## 2024-11-03 MED ORDER — GLUCERNA SHAKE PO LIQD
237.0000 mL | Freq: Three times a day (TID) | ORAL | Status: DC
  Administered 2024-11-03 – 2024-11-06 (×5): 237 mL via ORAL

## 2024-11-03 NOTE — ED Notes (Signed)
 Pt transferred to floor for admissions.

## 2024-11-03 NOTE — Plan of Care (Signed)
   Problem: Clinical Measurements: Goal: Ability to maintain clinical measurements within normal limits will improve Outcome: Progressing   Problem: Activity: Goal: Risk for activity intolerance will decrease Outcome: Progressing   Problem: Nutrition: Goal: Adequate nutrition will be maintained Outcome: Progressing

## 2024-11-03 NOTE — ED Notes (Signed)
Pt has not had a BM

## 2024-11-03 NOTE — ED Notes (Signed)
 CCMD called to place pt on the monitor

## 2024-11-03 NOTE — ED Notes (Signed)
 Output 

## 2024-11-03 NOTE — Progress Notes (Signed)
 Initial Nutrition Assessment  DOCUMENTATION CODES:   Non-severe (moderate) malnutrition in context of chronic illness  INTERVENTION:  Add Glucerna Shake PO TID. Each supplement provides 220 Kcals and 10 grams of protein. Provide meal ordering/feeding assistance and encouragement. Continue Dys 2 diet with thin liquids. Suggest SLP eval.   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 Suspect the patient might likely qualify for severe malnutrition if/when more information is available.   GOAL:   Patient will meet greater than or equal to 90% of their needs   MONITOR:   PO intake, Supplement acceptance, Diet advancement, Labs, Weight trends  REASON FOR ASSESSMENT:   Malnutrition Screening Tool    ASSESSMENT:   Patient presented with diarrhea and poor PO intake and was found to have bilateral pleural effusion/CHF exacerbation, AKI, and hyponatremia. PMH significant for recent admission 06/2024 for aspiration PNA requiring Dys 1 diet, moderate malnutrition, DM2, CKD3, complete heart block, HTN, dyslipidemia, prostate cancer.  Visited the patient who was having difficulty being aroused and staying awake. He essentially mumbles his responses. He states he is eating well but RN passing by room states he barely ate 10% of his meals today. He has an empty Ensure at bedside. He states he has had decreased PO and appetite for 1 month and has noticed weight loss but is unable to answer any other questions including his UBW. He was unable to state why he requires a Dys 2 diet (no SLP eval noted at this time but patient required a Dys 1 diet last admission in July.) The patient is noted to have good dentition.   Scheduled Meds:  amLODipine   10 mg Oral Daily   aspirin  EC  81 mg Oral Daily   atorvastatin   40 mg Oral Daily   vitamin B-12  1,000 mcg Oral Daily   doxycycline   100 mg Oral BID   ferrous  sulfate  325 mg Oral Q breakfast   furosemide  40 mg Intravenous Q12H   heparin  5,000 Units Subcutaneous Q8H   insulin  aspart  0-6 Units Subcutaneous Q4H   insulin  glargine-yfgn  5 Units Subcutaneous Daily   magnesium  oxide  400 mg Oral Daily   pantoprazole   40 mg Oral Daily   senna-docusate  2 tablet Oral QHS   Continuous Infusions:  cefTRIAXone  (ROCEPHIN )  IV       Diet Order             DIET DYS 2 Room service appropriate? Yes; Fluid consistency: Thin  Diet effective now                  Meal Intake: 10% per RN  Labs:     Latest Ref Rng & Units 11/03/2024    8:09 AM 11/03/2024    6:57 AM 11/02/2024    4:16 AM  CMP  Glucose 70 - 99 mg/dL 782  739  76   BUN 8 - 23 mg/dL 42  41  43   Creatinine 0.61 - 1.24 mg/dL 8.11  8.05  7.95   Sodium 135 - 145 mmol/L 133  134  133   Potassium 3.5 - 5.1 mmol/L 4.4  4.4  4.2   Chloride 98 - 111 mmol/L 98  97  103   CO2 22 - 32 mmol/L 20  22  20    Calcium  8.9 - 10.3 mg/dL 8.8  8.9  7.9   Total Protein 6.5 - 8.1 g/dL  6.0   Total Bilirubin 0.0 - 1.2 mg/dL   0.4   Alkaline Phos 38 - 126 U/L   67   AST 15 - 41 U/L   13   ALT 0 - 44 U/L   10      I/O: -1 L since admit  NUTRITION - FOCUSED PHYSICAL EXAM:  Flowsheet Row Most Recent Value  Orbital Region Mild depletion  Upper Arm Region Mild depletion  Thoracic and Lumbar Region Mild depletion  Buccal Region Moderate depletion  Temple Region Moderate depletion  Clavicle Bone Region Moderate depletion  Clavicle and Acromion Bone Region Severe depletion  Scapular Bone Region Severe depletion  Dorsal Hand Moderate depletion  Patellar Region Moderate depletion  Anterior Thigh Region Severe depletion  Posterior Calf Region Severe depletion  Edema (RD Assessment) Moderate  [+2 BLE]  Hair Reviewed  Eyes Reviewed  Mouth Reviewed  Skin Reviewed  Nails Reviewed    EDUCATION NEEDS:   Not appropriate for education at this time  Skin:  Skin Assessment: Reviewed RN  Assessment  Last BM:  11/3  Height:   Ht Readings from Last 1 Encounters:  11/01/24 5' (1.524 m)    5 Kg (6.5%) loss in 3 months  Ideal Body Weight:  45.5 kg  BMI:  Body mass index is 29.23 kg/m.  Estimated Nutritional Needs:  Kcal:  1700-2000 Protein:  70-100 Fluid:  1700-2000    Leverne Ruth, MS, RDN, LDN [place] See AMION for contact information

## 2024-11-03 NOTE — Progress Notes (Signed)
 PHARMACIST - PHYSICIAN COMMUNICATION  DR:   Caleen  CONCERNING: IV to Oral Route Change Policy  RECOMMENDATION: This patient is receiving doxycycline  by the intravenous route.  Based on criteria approved by the Pharmacy and Therapeutics Committee, the intravenous medication(s) is/are being converted to the equivalent oral dose form(s).   DESCRIPTION: These criteria include: The patient is eating (either orally or via tube) and/or has been taking other orally administered medications for a least 24 hours The patient has no evidence of active gastrointestinal bleeding or impaired GI absorption (gastrectomy, short bowel, patient on TNA or NPO).  If you have questions about this conversion, please contact the Pharmacy Department  []   [phone] )  [facility] []   [phone] )  [facility] [x]   [phone] )  [facility] []   [phone] )  [facility] []   [phone] )  [facility]   Change ceftriaxone  to 2g IV qday for PNA.  Sergio Batch, PharmD, BCIDP, AAHIVP, CPP Infectious Disease Pharmacist 11/03/2024 10:08 AM

## 2024-11-03 NOTE — ED Notes (Signed)
 Lab sent

## 2024-11-03 NOTE — Evaluation (Signed)
 Physical Therapy Evaluation Patient Details Name: Andre Stout MRN: [id number] DOB: August 31, 1940 Today's Date: 11/03/2024  History of Present Illness  84 y.o. M adm 11/01/24 with AMS, diarrhea, bil pleural effusion, AKI, hyponatremia. PMH: AV block, HTN, T2DM, prostate CA, glaucoma, CHF, CVA, HLD  Clinical Impression  Pt pleasantly confused stating year as 1963. Pt able to walk with RW and min assist with cues and direction for mobility and safety with all transfers. Pt lives with daughter per chart and without family present to confirm assist at baseline. If family able to provide 24hr supervision/assist, return home with HHPT is appropriate but if not pt will benefit from continued inpatient follow up therapy, <3 hours/day. Pt will benefit from acute therapy to maximize strength, function and safety.          If plan is discharge home, recommend the following: A little help with walking and/or transfers;A little help with bathing/dressing/bathroom;Assistance with cooking/housework;Direct supervision/assist for financial management;Supervision due to cognitive status;Assist for transportation;Help with stairs or ramp for entrance   Can travel by private vehicle        Equipment Recommendations None recommended by PT  Recommendations for Other Services       Functional Status Assessment Patient has had a recent decline in their functional status and/or demonstrates limited ability to make significant improvements in function in a reasonable and predictable amount of time     Precautions / Restrictions Precautions Precautions: Fall Recall of Precautions/Restrictions: Impaired      Mobility  Bed Mobility Overal bed mobility: Needs Assistance Bed Mobility: Supine to Sit     Supine to sit: Min assist     General bed mobility comments: HOB 15 degrees with cues for rolling and rise to side with use of mattress, min assist to elevate trunk and pivot fully to EOB     Transfers Overall transfer level: Needs assistance   Transfers: Sit to/from Stand Sit to Stand: Contact guard assist           General transfer comment: cues for hand placement and backing to surface to sit    Ambulation/Gait Ambulation/Gait assistance: Min assist Gait Distance (Feet): 100 Feet Assistive device: Rolling walker (2 wheels) Gait Pattern/deviations: Step-through pattern, Decreased stride length, Narrow base of support, Scissoring   Gait velocity interpretation: <1.31 ft/sec, indicative of household ambulator   General Gait Details: pt with flexed trunk, narrow BOS and scissoring at times. Cues for posture, increased BOS, direction and safety with chair follow but not required for future trials  Stairs            Wheelchair Mobility     Tilt Bed    Modified Rankin (Stroke Patients Only)       Balance Overall balance assessment: Needs assistance Sitting-balance support: No upper extremity supported, Feet supported Sitting balance-Leahy Scale: Fair     Standing balance support: Bilateral upper extremity supported, During functional activity, Reliant on assistive device for balance Standing balance-Leahy Scale: Poor Standing balance comment: reliant on RW for support                             Pertinent Vitals/Pain Pain Assessment Pain Assessment: No/denies pain    Home Living Family/patient expects to be discharged to:: Private residence Living Arrangements: Children Available Help at Discharge: Family;Available PRN/intermittently Type of Home: House Home Access: Stairs to enter;Ramped entrance   Entrance Stairs-Number of Steps: 5   Home Layout: One level Home  Equipment: Agricultural Consultant (2 wheels);Wheelchair - manual;Shower seat Additional Comments: pt unable to state, take from prior admission as family not present. Need to confirm available assist    Prior Function Prior Level of Function : Patient poor historian/Family  not available             Mobility Comments: poor historian, reports w/c for mobility, RW for very short distances       Extremity/Trunk Assessment   Upper Extremity Assessment Upper Extremity Assessment: Generalized weakness    Lower Extremity Assessment Lower Extremity Assessment: Generalized weakness    Cervical / Trunk Assessment Cervical / Trunk Assessment: Kyphotic  Communication   Communication Communication: No apparent difficulties    Cognition Arousal: Alert Behavior During Therapy: WFL for tasks assessed/performed   PT - Cognitive impairments: No family/caregiver present to determine baseline, Orientation, Safety/Judgement, Problem solving, Memory   Orientation impairments: Time, Situation, Place                     Following commands: Impaired Following commands impaired: Follows one step commands with increased time     Cueing Cueing Techniques: Verbal cues, Gestural cues     General Comments      Exercises     Assessment/Plan    PT Assessment Patient needs continued PT services  PT Problem List Decreased strength;Decreased activity tolerance;Decreased balance;Decreased safety awareness;Decreased mobility       PT Treatment Interventions Gait training;Stair training;Functional mobility training;Therapeutic exercise;Therapeutic activities;Patient/family education;Cognitive remediation;Neuromuscular re-education;DME instruction;Balance training    PT Goals (Current goals can be found in the Care Plan section)  Acute Rehab PT Goals PT Goal Formulation: Patient unable to participate in goal setting Time For Goal Achievement: 11/17/24 Potential to Achieve Goals: Fair    Frequency Min 2X/week     Co-evaluation               AM-PAC PT 6 Clicks Mobility  Outcome Measure Help needed turning from your back to your side while in a flat bed without using bedrails?: A Little Help needed moving from lying on your back to sitting  on the side of a flat bed without using bedrails?: A Little Help needed moving to and from a bed to a chair (including a wheelchair)?: A Little Help needed standing up from a chair using your arms (e.g., wheelchair or bedside chair)?: A Little Help needed to walk in hospital room?: A Little Help needed climbing 3-5 steps with a railing? : A Lot 6 Click Score: 17    End of Session Equipment Utilized During Treatment: Gait belt Activity Tolerance: Patient tolerated treatment well Patient left: in chair;with call bell/phone within reach;with chair alarm set Nurse Communication: Mobility status PT Visit Diagnosis: Other abnormalities of gait and mobility (R26.89);Difficulty in walking, not elsewhere classified (R26.2);Muscle weakness (generalized) (M62.81)    Time: 9173-9156 PT Time Calculation (min) (ACUTE ONLY): 17 min   Charges:   PT Evaluation $PT Eval Moderate Complexity: 1 Mod   PT General Charges $$ ACUTE PT VISIT: 1 Visit         Lenoard SQUIBB, PT Acute Rehabilitation Services Office: [phone]   Paije Goodhart B Massai Hankerson 11/03/2024, 11:04 AM

## 2024-11-03 NOTE — ED Notes (Signed)
 Pt being transferred to floor for admission

## 2024-11-03 NOTE — ED Notes (Signed)
 No presence of BM. Pt does not have to go

## 2024-11-03 NOTE — Progress Notes (Signed)
 PROGRESS NOTE    DUSHAUN OKEY  FMW:[id number] DOB: August 07, 1940 DOA: 11/01/2024 PCP: Georgina Leita Norris, DO    Brief Narrative:  84 year old with history of DM2, HTN, chronic anemia, HLD, complete heart block, prostate cancer comes to the ED for poor p.o. intake and diarrhea.  Recently admitted in September for aspiration pneumonia.  Workup in the ER revealed large bilateral pleural effusion concerning for CHF exacerbation, AKI, hyponatremia, elevated BNP, troponin and lactic acidosis.   Assessment & Plan:  Acute CHF exacerbation with preserved EF, 65% Bilateral pleural effusion, large with compressive atelectasis Lactic acidosis NSTEMI - Does have some signs of volume overload but no evidence of overt hypoxia or respiratory failure at this time.  He also denies any chest pain, EKG showing some T wave inversion.  Patient has been seen by cardiology and at this time plan is to obtain echocardiogram.  Comfortable at rest on room air but due to signs of volume overload given Lasix 40 mg IV twice daily  -Thoracentesis if necessary -Troponins are slowly trending down, CK levels are normal  Acute kidney injury -Baseline creatinine 1.1, admission creatinine 2.29> 1.88  Diarrhea/poor intake -Will check stool studies  Vitamin B12 deficiency - Supplements  Multifocal pneumonia - Elevated procalcitonin, will continue Rocephin  and azithromycin  Hyponatremia -In the setting of volume overload.  Admission sodium 127 now slowly improving  History of intermittent complete heart block -Followed by cardiology  Anemia of chronic disease with microcytosis - Low iron saturation.  P.o. 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  IV as needed     DVT prophylaxis: heparin injection 5,000 Units Start: 11/01/24 2200      Code  Status: Limited: Do not attempt resuscitation (DNR) -DNR-LIMITED -Do Not Intubate/DNI  Family Communication:   Ongoing diuresis   PT Follow up Recs:   Subjective: Seen at bedside resting comfortably.  Pleasantly confused and answers very basic questions denies any shortness of breath or chest pain   Examination:  General exam: Appears calm and comfortable  Respiratory system: Slightly diminished breath sounds at the bases Cardiovascular system: S1 & S2 heard, RRR. No JVD, murmurs, rubs, gallops or clicks. No pedal edema. Gastrointestinal system: Abdomen is nondistended, soft and nontender. No organomegaly or masses felt. Normal bowel sounds heard. Central nervous system: Alert and oriented. No focal neurological deficits. Extremities: Symmetric 5 x 5 power. Skin: No rashes, lesions or ulcers Psychiatry: Judgement and insight appear poor                Diet Orders (From admission, onward)     Start     Ordered   11/01/24 2121  DIET DYS 2 Room service appropriate? Yes; Fluid consistency: Thin  Diet effective now       Question Answer Comment  Room service appropriate? Yes   Fluid consistency: Thin      11/01/24 2121            Objective: Vitals:   11/03/24 0600 11/03/24 0752 11/03/24 0935 11/03/24 1113  BP: 124/71 133/62 133/62 117/66  Pulse: 66 71  68  Resp: 17 17  17   Temp: 97.9 F (36.6 C) 97.6 F (36.4 C)  97.7 F (36.5 C)  TempSrc: Oral Oral  Oral  SpO2: 92% 94%  94%  Weight: 67.9 kg     Height:        Intake/Output Summary (Last 24  hours) at 11/03/2024 1215 Last data filed at 11/03/2024 9370 Gross per 24 hour  Intake 465.38 ml  Output 1350 ml  Net -884.62 ml   Filed Weights   11/01/24 1745 11/03/24 0600  Weight: 72 kg 67.9 kg    Scheduled Meds:  amLODipine   10 mg Oral Daily   aspirin  EC  81 mg Oral Daily   atorvastatin   40 mg Oral Daily   vitamin B-12  1,000 mcg Oral Daily   doxycycline   100 mg Oral BID   ferrous sulfate  325 mg  Oral Q breakfast   furosemide  40 mg Intravenous Q12H   heparin  5,000 Units Subcutaneous Q8H   insulin  aspart  0-6 Units Subcutaneous Q4H   insulin  glargine-yfgn  5 Units Subcutaneous Daily   magnesium  oxide  400 mg Oral Daily   pantoprazole   40 mg Oral Daily   senna-docusate  2 tablet Oral QHS   Continuous Infusions:  cefTRIAXone  (ROCEPHIN )  IV      Nutritional status     Body mass index is 29.23 kg/m.  Data Reviewed:   CBC: Recent Labs  Lab 11/01/24 1830 11/01/24 2136 11/02/24 0416 11/02/24 1318 11/03/24 0330  WBC 8.0 7.7 6.9 6.8 6.6  NEUTROABS 6.2  --  4.2  --   --   HGB 10.7* 9.8* 9.0* 9.3* 9.6*  HCT 34.3* 31.2* 28.6* 28.7* 29.9*  MCV 80.1 79.6* 79.2* 78.8* 79.1*  PLT 397 367 380 326 368   Basic Metabolic Panel: Recent Labs  Lab 11/01/24 1830 11/01/24 2136 11/02/24 0416 11/03/24 0657 11/03/24 0809  NA 127* 128* 133* 134* 133*  K 5.4* 4.8 4.2 4.4 4.4  CL 94* 96* 103 97* 98  CO2 20* 19* 20* 22 20*  GLUCOSE 350* 288* 76 260* 217*  BUN 50* 48* 43* 41* 42*  CREATININE 2.29* 2.26* 2.04* 1.94* 1.88*  CALCIUM  8.8* 8.2* 7.9* 8.9 8.8*  MG  --  2.2  --  2.0  --   PHOS  --   --   --  3.9  --    GFR: Estimated Creatinine Clearance: 24.1 mL/min (A) (by C-G formula based on SCr of 1.88 mg/dL (H)). Liver Function Tests: Recent Labs  Lab 11/01/24 1830 11/02/24 0416  AST 15 13*  ALT 12 10  ALKPHOS 78 67  BILITOT 0.9 0.4  PROT 6.9 6.0*  ALBUMIN  2.9* 2.5*   No results for input(s): LIPASE, AMYLASE in the last 168 hours. Recent Labs  Lab 11/02/24 0845  AMMONIA <13   Coagulation Profile: Recent Labs  Lab 11/01/24 1830  INR 1.2   Cardiac Enzymes: Recent Labs  Lab 11/01/24 2136  CKTOTAL 37*   BNP (last 3 results) No results for input(s): PROBNP in the last 8760 hours. HbA1C: No results for input(s): HGBA1C in the last 72 hours. CBG: Recent Labs  Lab 11/02/24 2310 11/03/24 0506 11/03/24 0605 11/03/24 0751 11/03/24 1104  GLUCAP  195* 258* 240* 226* 242*   Lipid Profile: No results for input(s): CHOL, HDL, LDLCALC, TRIG, CHOLHDL, LDLDIRECT in the last 72 hours. Thyroid  Function Tests: Recent Labs    11/02/24 0845  TSH 4.170   Anemia Panel: Recent Labs    11/01/24 2136 11/02/24 0845 11/03/24 0657  VITAMINB12  --  <150*  --   FOLATE  --  8.0 8.7  FERRITIN 156  --   --   TIBC 217*  --   --   IRON 28*  --   --   RETICCTPCT  --  0.8  --    Sepsis Labs: Recent Labs  Lab 11/01/24 1826 11/01/24 2023 11/02/24 0845 11/02/24 0859  PROCALCITON  --   --  5.63  --   LATICACIDVEN 3.6* 2.9*  --  1.8    Recent Results (from the past 240 hours)  Resp panel by RT-PCR (RSV, Flu A&B, Covid) Anterior Nasal Swab     Status: None   Collection Time: 11/01/24  5:50 PM   Specimen: Anterior Nasal Swab  Result Value Ref Range Status   SARS Coronavirus 2 by RT PCR NEGATIVE NEGATIVE Final   Influenza A by PCR NEGATIVE NEGATIVE Final   Influenza B by PCR NEGATIVE NEGATIVE Final    Comment: (NOTE) The Xpert Xpress SARS-CoV-2/FLU/RSV plus assay is intended as an aid in the diagnosis of influenza from Nasopharyngeal swab specimens and should not be used as a sole basis for treatment. Nasal washings and aspirates are unacceptable for Xpert Xpress SARS-CoV-2/FLU/RSV testing.  Fact Sheet for Patients: bloggercourse.com  Fact Sheet for Healthcare Providers: seriousbroker.it  This test is not yet approved or cleared by the [place]  FDA and has been authorized for detection and/or diagnosis of SARS-CoV-2 by FDA under an Emergency Use Authorization (EUA). This EUA will remain in effect (meaning this test can be used) for the duration of the COVID-19 declaration under Section 564(b)(1) of the Act, 21 U.S.C. section 360bbb-3(b)(1), unless the authorization is terminated or revoked.     Resp Syncytial Virus by PCR NEGATIVE NEGATIVE Final    Comment:  (NOTE) Fact Sheet for Patients: bloggercourse.com  Fact Sheet for Healthcare Providers: seriousbroker.it  This test is not yet approved or cleared by the [place]  FDA and has been authorized for detection and/or diagnosis of SARS-CoV-2 by FDA under an Emergency Use Authorization (EUA). This EUA will remain in effect (meaning this test can be used) for the duration of the COVID-19 declaration under Section 564(b)(1) of the Act, 21 U.S.C. section 360bbb-3(b)(1), unless the authorization is terminated or revoked.  Performed at [facility], [address], [place], [place] [address]   Blood Culture (routine x 2)     Status: None (Preliminary result)   Collection Time: 11/01/24  5:55 PM   Specimen: BLOOD  Result Value Ref Range Status   Specimen Description BLOOD LEFT ANTECUBITAL  Final   Special Requests   Final    BOTTLES DRAWN AEROBIC AND ANAEROBIC Blood Culture results may not be optimal due to an inadequate volume of blood received in culture bottles   Culture   Final    NO GROWTH 2 DAYS Performed at [facility], [address] [place], [place] [address]    Report Status PENDING  Incomplete  Blood Culture (routine x 2)     Status: Abnormal   Collection Time: 11/01/24  6:30 PM   Specimen: BLOOD LEFT FOREARM  Result Value Ref Range Status   Specimen Description BLOOD LEFT FOREARM  Final   Special Requests   Final    BOTTLES DRAWN AEROBIC AND ANAEROBIC Blood Culture results may not be optimal due to an inadequate volume of blood received in culture bottles   Culture  Setup Time   Final    GRAM POSITIVE COCCI IN CLUSTERS AEROBIC BOTTLE ONLY CRITICAL RESULT CALLED TO, READ BACK BY AND VERIFIED WITH: PHARMD EMILY S [phone] FCP    Culture (A)  Final    STAPHYLOCOCCUS HAEMOLYTICUS THE SIGNIFICANCE OF ISOLATING THIS ORGANISM FROM A SINGLE SET OF BLOOD  CULTURES WHEN MULTIPLE SETS ARE DRAWN IS UNCERTAIN. PLEASE  NOTIFY THE MICROBIOLOGY DEPARTMENT WITHIN ONE WEEK IF SPECIATION AND SENSITIVITIES ARE REQUIRED. Performed at [facility], [address] [place], [place] [address]    Report Status 11/03/2024 FINAL  Final  Blood Culture ID Panel (Reflexed)     Status: Abnormal   Collection Time: 11/01/24  6:30 PM  Result Value Ref Range Status   Enterococcus faecalis NOT DETECTED NOT DETECTED Final   Enterococcus Faecium NOT DETECTED NOT DETECTED Final   Listeria monocytogenes NOT DETECTED NOT DETECTED Final   Staphylococcus species DETECTED (A) NOT DETECTED Final    Comment: CRITICAL RESULT CALLED TO, READ BACK BY AND VERIFIED WITH: PHARMD EMILY S [phone] FCP    Staphylococcus aureus (BCID) NOT DETECTED NOT DETECTED Final   Staphylococcus epidermidis NOT DETECTED NOT DETECTED Final   Staphylococcus lugdunensis NOT DETECTED NOT DETECTED Final   Streptococcus species NOT DETECTED NOT DETECTED Final   Streptococcus agalactiae NOT DETECTED NOT DETECTED Final   Streptococcus pneumoniae NOT DETECTED NOT DETECTED Final   Streptococcus pyogenes NOT DETECTED NOT DETECTED Final   A.calcoaceticus-baumannii NOT DETECTED NOT DETECTED Final   Bacteroides fragilis NOT DETECTED NOT DETECTED Final   Enterobacterales NOT DETECTED NOT DETECTED Final   Enterobacter cloacae complex NOT DETECTED NOT DETECTED Final   Escherichia coli NOT DETECTED NOT DETECTED Final   Klebsiella aerogenes NOT DETECTED NOT DETECTED Final   Klebsiella oxytoca NOT DETECTED NOT DETECTED Final   Klebsiella pneumoniae NOT DETECTED NOT DETECTED Final   Proteus species NOT DETECTED NOT DETECTED Final   Salmonella species NOT DETECTED NOT DETECTED Final   Serratia marcescens NOT DETECTED NOT DETECTED Final   Haemophilus influenzae NOT DETECTED NOT DETECTED Final   Neisseria meningitidis NOT DETECTED NOT DETECTED Final   Pseudomonas aeruginosa NOT DETECTED NOT DETECTED Final   Stenotrophomonas maltophilia NOT DETECTED NOT DETECTED  Final   Candida albicans NOT DETECTED NOT DETECTED Final   Candida auris NOT DETECTED NOT DETECTED Final   Candida glabrata NOT DETECTED NOT DETECTED Final   Candida krusei NOT DETECTED NOT DETECTED Final   Candida parapsilosis NOT DETECTED NOT DETECTED Final   Candida tropicalis NOT DETECTED NOT DETECTED Final   Cryptococcus neoformans/gattii NOT DETECTED NOT DETECTED Final    Comment: Performed at [facility], [address] [place], [place] [address]         Radiology Studies: ECHOCARDIOGRAM COMPLETE Result Date: 11/02/2024    ECHOCARDIOGRAM REPORT   Patient Name:   PARAS KREIDER Date of Exam: 11/02/2024 Medical Rec #:  [id number]      Height:       60.0 in Accession #:    [id number]     Weight:       158.7 lb Date of Birth:  September 19, 1940     BSA:          1.692 m Patient Age:    18 years       BP:           90/45 mmHg Patient Gender: M              HR:           81 bpm. Exam Location:  Inpatient Procedure: 2D Echo and Intracardiac Opacification Agent (Both Spectral and Color            Flow Doppler were utilized during procedure). Indications:    Elevated Troponin  History:  Patient has prior history of Echocardiogram examinations.  Sonographer:    Charmaine Gaskins Referring Phys: [id number] ARSHAD N KAKRAKANDY IMPRESSIONS  1. Left ventricular ejection fraction, by estimation, is 35 to 40%. The left ventricle has moderately decreased function. The left ventricle demonstrates regional wall motion abnormalities (see scoring diagram/findings for description). Left ventricular  diastolic function could not be evaluated.  2. Right ventricular systolic function is mildly reduced. The right ventricular size is normal.  3. Left atrial size was severely dilated.  4. Right atrial size was mildly dilated.  5. The mitral valve is normal in structure. Mild mitral valve regurgitation. No evidence of mitral stenosis.  6. The aortic valve is normal in structure. Aortic valve regurgitation is not visualized.  No aortic stenosis is present.  7. The inferior vena cava is dilated in size with <50% respiratory variability, suggesting right atrial pressure of 15 mmHg. Comparison(s): Compared to prior echo, EF is reduced and there are new wall motion abnormalities. FINDINGS  Left Ventricle: Left ventricular ejection fraction, by estimation, is 35 to 40%. The left ventricle has moderately decreased function. The left ventricle demonstrates regional wall motion abnormalities. Definity  contrast agent was given IV to delineate the left ventricular endocardial borders. The left ventricular internal cavity size was normal in size. There is no left ventricular hypertrophy. Left ventricular diastolic function could not be evaluated due to nondiagnostic images. Left ventricular diastolic function could not be evaluated.  LV Wall Scoring: The mid and distal anterior wall, entire anterior septum, mid inferoseptal segment, and apex are hypokinetic. The entire lateral wall, entire inferior wall, basal anterior segment, and basal inferoseptal segment are normal. Right Ventricle: The right ventricular size is normal. No increase in right ventricular wall thickness. Right ventricular systolic function is mildly reduced. Left Atrium: Left atrial size was severely dilated. Right Atrium: Right atrial size was mildly dilated. Pericardium: There is no evidence of pericardial effusion. Mitral Valve: The mitral valve is normal in structure. Mild mitral valve regurgitation. No evidence of mitral valve stenosis. Tricuspid Valve: The tricuspid valve is normal in structure. Tricuspid valve regurgitation is not demonstrated. No evidence of tricuspid stenosis. Aortic Valve: The aortic valve is normal in structure. Aortic valve regurgitation is not visualized. No aortic stenosis is present. Pulmonic Valve: The pulmonic valve was normal in structure. Pulmonic valve regurgitation is not visualized. No evidence of pulmonic stenosis. Aorta: The aortic root is  normal in size and structure. Venous: The inferior vena cava is dilated in size with less than 50% respiratory variability, suggesting right atrial pressure of 15 mmHg. IAS/Shunts: No atrial level shunt detected by color flow Doppler.  LEFT VENTRICLE PLAX 2D LVIDd:         4.00 cm      Diastology LVIDs:         2.80 cm      LV e' medial:    7.83 cm/s LV PW:         0.90 cm      LV E/e' medial:  12.5 LV IVS:        0.90 cm      LV e' lateral:   9.14 cm/s LVOT diam:     2.30 cm      LV E/e' lateral: 10.7 LVOT Area:     4.15 cm  LV Volumes (MOD) LV vol d, MOD A2C: 123.0 ml LV vol d, MOD A4C: 97.0 ml LV vol s, MOD A2C: 66.8 ml LV vol s, MOD A4C: 66.1 ml LV SV  MOD A2C:     56.2 ml LV SV MOD A4C:     97.0 ml LV SV MOD BP:      41.5 ml RIGHT VENTRICLE RV Basal diam:  3.40 cm RV Mid diam:    3.70 cm RV S prime:     9.36 cm/s TAPSE (M-mode): 1.0 cm LEFT ATRIUM              Index        RIGHT ATRIUM           Index LA diam:        4.00 cm  2.36 cm/m   RA Area:     19.20 cm LA Vol (A2C):   102.0 ml 60.28 ml/m  RA Volume:   53.90 ml  31.86 ml/m LA Vol (A4C):   83.3 ml  49.23 ml/m LA Biplane Vol: 95.0 ml  56.15 ml/m   AORTA Ao Root diam: 3.40 cm Ao Asc diam:  2.90 cm MITRAL VALVE MV Area (PHT): 4.18 cm    SHUNTS MV Decel Time: 182 msec    Systemic Diam: 2.30 cm MV E velocity: 97.60 cm/s MV A velocity: 30.40 cm/s MV E/A ratio:  3.21 Franck Azobou Tonleu Electronically signed by Joelle Cedars Tonleu Signature Date/Time: 11/02/2024/2:15:07 PM    Final    CT CHEST ABDOMEN PELVIS WO CONTRAST Result Date: 11/01/2024 EXAM: CT CHEST, ABDOMEN AND PELVIS WITHOUT CONTRAST 11/01/2024 08:09:41 PM TECHNIQUE: CT of the chest, abdomen and pelvis was performed without the administration of intravenous contrast. Multiplanar reformatted images are provided for review. Automated exposure control, iterative reconstruction, and/or weight based adjustment of the mA/kV was utilized to reduce the radiation dose to as low as reasonably  achievable. COMPARISON: 09/02/2024 CLINICAL HISTORY: Sepsis. FINDINGS: CHEST: MEDIASTINUM AND LYMPH NODES: Mild cardiomegaly. Coronary artery and aortic atherosclerosis. The central airways are clear. No mediastinal, hilar or axillary lymphadenopathy. LUNGS AND PLEURA: Moderate to large bilateral pleural effusions. Compressive atelectasis in the lower lobes bilaterally. Scattered ground glass opacities throughout the lungs may reflect early edema. No pneumothorax. ABDOMEN AND PELVIS: LIVER: The liver is unremarkable. GALLBLADDER AND BILE DUCTS: Small layering gallstones within the gallbladder. No biliary ductal dilatation. SPLEEN: No acute abnormality. PANCREAS: No acute abnormality. ADRENAL GLANDS: No acute abnormality. KIDNEYS, URETERS AND BLADDER: Stable 11 cm cyst in the mid to lower pole of the right kidney. No stones in the kidneys or ureters. No hydronephrosis. No perinephric or periureteral stranding. Urinary bladder is unremarkable. GI AND BOWEL: Stomach demonstrates no acute abnormality. There is no bowel obstruction. REPRODUCTIVE ORGANS: Radiation seeds in the region of the prostate. PERITONEUM AND RETROPERITONEUM: No ascites. No free air. VASCULATURE: Aorta is normal in caliber. Aortic atherosclerosis. ABDOMINAL AND PELVIS LYMPH NODES: No lymphadenopathy. BONES AND SOFT TISSUES: Severe chronic compression fracture at L1, unchanged. No focal soft tissue abnormality. IMPRESSION: 1. Moderate to large bilateral pleural effusions with compressive atelectasis in the lower lobes bilaterally. 2. Scattered ground-glass opacities throughout the lungs, possibly reflecting early edema. 3. Mild cardiomegaly. 4. Cholelithiasis. 5. No acute findings in the abdomen or pelvis. Electronically signed by: Franky Crease MD 11/01/2024 08:18 PM EST RP Workstation: [id number]   CT Head Wo Contrast Result Date: 11/01/2024 EXAM: CT HEAD WITHOUT CONTRAST 11/01/2024 08:09:41 PM TECHNIQUE: CT of the head was performed without the  administration of intravenous contrast. Automated exposure control, iterative reconstruction, and/or weight based adjustment of the mA/kV was utilized to reduce the radiation dose to as low as reasonably achievable. COMPARISON: CT 09/02/2024  CLINICAL HISTORY: Mental status change, unknown cause. FINDINGS: BRAIN AND VENTRICLES: No acute hemorrhage. No evidence of acute infarct. No hydrocephalus. No extra-axial collection. No mass effect or midline shift. Diffuse cerebral and cerebellar atrophy. Patchy and confluent areas of decreased attenuation are noted throughout the deep and periventricular white matter of the cerebral hemispheres bilaterally, suggestive of chronic microvascular ischemic changes. Atherosclerotic calcifications are present within the cavernous internal carotid and vertebral arteries. ORBITS: No acute abnormality. Bilateral lens replacement. SINUSES: No acute abnormality. SOFT TISSUES AND SKULL: Trace right posterior scalp hematoma (7.36). No skull fracture. IMPRESSION: 1. No acute intracranial abnormality. Electronically signed by: Morgane Naveau MD 11/01/2024 08:16 PM EST RP Workstation: [id number]   DG Chest Port 1 View Result Date: 11/01/2024 EXAM: 1 VIEW(S) XRAY OF THE CHEST 11/01/2024 06:40:00 PM COMPARISON: 09/02/2024 CLINICAL HISTORY: Questionable sepsis - evaluate for abnormality. FINDINGS: LUNGS AND PLEURA: Consolidation in the left lower lobe concerning for pneumonia. Possible small left effusion. No pulmonary edema. No pneumothorax. HEART AND MEDIASTINUM: Heart is borderline in size. Aortic atherosclerosis. BONES AND SOFT TISSUES: No acute osseous abnormality. IMPRESSION: 1. Left lower lobe consolidation concerning for pneumonia. 2. Possible small left pleural effusion. Electronically signed by: Franky Crease MD 11/01/2024 06:50 PM EST RP Workstation: [id number]           LOS: 2 days   Time spent= 35 mins    Burgess JAYSON Dare, MD [facility]  If 7PM-7AM, please  contact night-coverage  11/03/2024, 12:15 PM

## 2024-11-03 NOTE — Progress Notes (Signed)
 Progress Note  Patient Name: Andre Stout Date of Encounter: 11/03/2024  Primary Cardiologist: Darryle ONEIDA Decent, MD   Subjective   Patient seen and examined at his bedside. He was awake, sitting in the chair and offers no complaints at this time  Inpatient Medications    Scheduled Meds:  amLODipine   10 mg Oral Daily   aspirin  EC  81 mg Oral Daily   atorvastatin   40 mg Oral Daily   vitamin B-12  1,000 mcg Oral Daily   ferrous sulfate  325 mg Oral Q breakfast   furosemide  40 mg Intravenous Q12H   heparin  5,000 Units Subcutaneous Q8H   insulin  aspart  0-6 Units Subcutaneous Q4H   magnesium  oxide  400 mg Oral Daily   pantoprazole   40 mg Oral Daily   senna-docusate  2 tablet Oral QHS   Continuous Infusions:  cefTRIAXone  (ROCEPHIN )  IV Stopped (11/02/24 2331)   doxycycline  (VIBRAMYCIN ) IV Stopped (11/02/24 2353)   PRN Meds: acetaminophen  **OR** acetaminophen , glucagon (human recombinant), hydrALAZINE , ipratropium-albuterol , metoprolol  tartrate   Vital Signs    Vitals:   11/03/24 0500 11/03/24 0515 11/03/24 0600 11/03/24 0752  BP: (!) 127/55 119/65 124/71 133/62  Pulse:   66 71  Resp: 15 14 17 17   Temp:   97.9 F (36.6 C) 97.6 F (36.4 C)  TempSrc:   Oral Oral  SpO2:   92% 94%  Weight:   67.9 kg   Height:        Intake/Output Summary (Last 24 hours) at 11/03/2024 0803 Last data filed at 11/03/2024 9370 Gross per 24 hour  Intake 465.38 ml  Output 1350 ml  Net -884.62 ml   Filed Weights   11/01/24 1745 11/03/24 0600  Weight: 72 kg 67.9 kg    Telemetry     - Personally Reviewed  ECG     - Personally Reviewed  Physical Exam    General: Comfortable Head: Atraumatic, normal size  Eyes: PEERLA, EOMI  Neck: Supple, normal JVD Cardiac: Normal S1, S2; RRR; no murmurs, rubs, or gallops Lungs: Clear to auscultation bilaterally Abd: Soft, nontender, no hepatomegaly  Ext: warm, no edema Musculoskeletal: No deformities, BUE and BLE strength normal and  equal Skin: Warm and dry, no rashes   Neuro: Alert and oriented to person, place, time, and situation, CNII-XII grossly intact, no focal deficits  Psych: Normal mood and affect   Labs    Chemistry Recent Labs  Lab 11/01/24 1830 11/01/24 2136 11/02/24 0416 11/03/24 0657  NA 127* 128* 133* 134*  K 5.4* 4.8 4.2 4.4  CL 94* 96* 103 97*  CO2 20* 19* 20* 22  GLUCOSE 350* 288* 76 260*  BUN 50* 48* 43* 41*  CREATININE 2.29* 2.26* 2.04* 1.94*  CALCIUM  8.8* 8.2* 7.9* 8.9  PROT 6.9  --  6.0*  --   ALBUMIN  2.9*  --  2.5*  --   AST 15  --  13*  --   ALT 12  --  10  --   ALKPHOS 78  --  67  --   BILITOT 0.9  --  0.4  --   GFRNONAA 28* 28* 32* 34*  ANIONGAP 13 13 10 15      Hematology Recent Labs  Lab 11/02/24 0416 11/02/24 0845 11/02/24 1318 11/03/24 0330  WBC 6.9  --  6.8 6.6  RBC 3.61* 3.82* 3.64* 3.78*  HGB 9.0*  --  9.3* 9.6*  HCT 28.6*  --  28.7* 29.9*  MCV 79.2*  --  78.8* 79.1*  MCH 24.9*  --  25.5* 25.4*  MCHC 31.5  --  32.4 32.1  RDW 16.7*  --  16.7* 16.9*  PLT 380  --  326 368    Cardiac EnzymesNo results for input(s): TROPONINI in the last 168 hours. No results for input(s): TROPIPOC in the last 168 hours.   BNP Recent Labs  Lab 11/01/24 1830  BNP 2,276.0*     DDimer No results for input(s): DDIMER in the last 168 hours.   Radiology    ECHOCARDIOGRAM COMPLETE Result Date: 11/02/2024    ECHOCARDIOGRAM REPORT   Patient Name:   FAROOQ PETROVICH Date of Exam: 11/02/2024 Medical Rec #:  [id number]      Height:       60.0 in Accession #:    [id number]     Weight:       158.7 lb Date of Birth:  1940/07/20     BSA:          1.692 m Patient Age:    84 years       BP:           90/45 mmHg Patient Gender: M              HR:           81 bpm. Exam Location:  Inpatient Procedure: 2D Echo and Intracardiac Opacification Agent (Both Spectral and Color            Flow Doppler were utilized during procedure). Indications:    Elevated Troponin  History:        Patient has  prior history of Echocardiogram examinations.  Sonographer:    Charmaine Gaskins Referring Phys: [id number] ARSHAD N KAKRAKANDY IMPRESSIONS  1. Left ventricular ejection fraction, by estimation, is 35 to 40%. The left ventricle has moderately decreased function. The left ventricle demonstrates regional wall motion abnormalities (see scoring diagram/findings for description). Left ventricular  diastolic function could not be evaluated.  2. Right ventricular systolic function is mildly reduced. The right ventricular size is normal.  3. Left atrial size was severely dilated.  4. Right atrial size was mildly dilated.  5. The mitral valve is normal in structure. Mild mitral valve regurgitation. No evidence of mitral stenosis.  6. The aortic valve is normal in structure. Aortic valve regurgitation is not visualized. No aortic stenosis is present.  7. The inferior vena cava is dilated in size with <50% respiratory variability, suggesting right atrial pressure of 15 mmHg. Comparison(s): Compared to prior echo, EF is reduced and there are new wall motion abnormalities. FINDINGS  Left Ventricle: Left ventricular ejection fraction, by estimation, is 35 to 40%. The left ventricle has moderately decreased function. The left ventricle demonstrates regional wall motion abnormalities. Definity  contrast agent was given IV to delineate the left ventricular endocardial borders. The left ventricular internal cavity size was normal in size. There is no left ventricular hypertrophy. Left ventricular diastolic function could not be evaluated due to nondiagnostic images. Left ventricular diastolic function could not be evaluated.  LV Wall Scoring: The mid and distal anterior wall, entire anterior septum, mid inferoseptal segment, and apex are hypokinetic. The entire lateral wall, entire inferior wall, basal anterior segment, and basal inferoseptal segment are normal. Right Ventricle: The right ventricular size is normal. No increase in right  ventricular wall thickness. Right ventricular systolic function is mildly reduced. Left Atrium: Left atrial size was severely dilated. Right Atrium: Right atrial size was mildly dilated. Pericardium: There is no  evidence of pericardial effusion. Mitral Valve: The mitral valve is normal in structure. Mild mitral valve regurgitation. No evidence of mitral valve stenosis. Tricuspid Valve: The tricuspid valve is normal in structure. Tricuspid valve regurgitation is not demonstrated. No evidence of tricuspid stenosis. Aortic Valve: The aortic valve is normal in structure. Aortic valve regurgitation is not visualized. No aortic stenosis is present. Pulmonic Valve: The pulmonic valve was normal in structure. Pulmonic valve regurgitation is not visualized. No evidence of pulmonic stenosis. Aorta: The aortic root is normal in size and structure. Venous: The inferior vena cava is dilated in size with less than 50% respiratory variability, suggesting right atrial pressure of 15 mmHg. IAS/Shunts: No atrial level shunt detected by color flow Doppler.  LEFT VENTRICLE PLAX 2D LVIDd:         4.00 cm      Diastology LVIDs:         2.80 cm      LV e' medial:    7.83 cm/s LV PW:         0.90 cm      LV E/e' medial:  12.5 LV IVS:        0.90 cm      LV e' lateral:   9.14 cm/s LVOT diam:     2.30 cm      LV E/e' lateral: 10.7 LVOT Area:     4.15 cm  LV Volumes (MOD) LV vol d, MOD A2C: 123.0 ml LV vol d, MOD A4C: 97.0 ml LV vol s, MOD A2C: 66.8 ml LV vol s, MOD A4C: 66.1 ml LV SV MOD A2C:     56.2 ml LV SV MOD A4C:     97.0 ml LV SV MOD BP:      41.5 ml RIGHT VENTRICLE RV Basal diam:  3.40 cm RV Mid diam:    3.70 cm RV S prime:     9.36 cm/s TAPSE (M-mode): 1.0 cm LEFT ATRIUM              Index        RIGHT ATRIUM           Index LA diam:        4.00 cm  2.36 cm/m   RA Area:     19.20 cm LA Vol (A2C):   102.0 ml 60.28 ml/m  RA Volume:   53.90 ml  31.86 ml/m LA Vol (A4C):   83.3 ml  49.23 ml/m LA Biplane Vol: 95.0 ml  56.15 ml/m    AORTA Ao Root diam: 3.40 cm Ao Asc diam:  2.90 cm MITRAL VALVE MV Area (PHT): 4.18 cm    SHUNTS MV Decel Time: 182 msec    Systemic Diam: 2.30 cm MV E velocity: 97.60 cm/s MV A velocity: 30.40 cm/s MV E/A ratio:  3.21 Franck Azobou Tonleu Electronically signed by Joelle Cedars Tonleu Signature Date/Time: 11/02/2024/2:15:07 PM    Final    CT CHEST ABDOMEN PELVIS WO CONTRAST Result Date: 11/01/2024 EXAM: CT CHEST, ABDOMEN AND PELVIS WITHOUT CONTRAST 11/01/2024 08:09:41 PM TECHNIQUE: CT of the chest, abdomen and pelvis was performed without the administration of intravenous contrast. Multiplanar reformatted images are provided for review. Automated exposure control, iterative reconstruction, and/or weight based adjustment of the mA/kV was utilized to reduce the radiation dose to as low as reasonably achievable. COMPARISON: 09/02/2024 CLINICAL HISTORY: Sepsis. FINDINGS: CHEST: MEDIASTINUM AND LYMPH NODES: Mild cardiomegaly. Coronary artery and aortic atherosclerosis. The central airways are clear. No mediastinal, hilar or axillary lymphadenopathy.  LUNGS AND PLEURA: Moderate to large bilateral pleural effusions. Compressive atelectasis in the lower lobes bilaterally. Scattered ground glass opacities throughout the lungs may reflect early edema. No pneumothorax. ABDOMEN AND PELVIS: LIVER: The liver is unremarkable. GALLBLADDER AND BILE DUCTS: Small layering gallstones within the gallbladder. No biliary ductal dilatation. SPLEEN: No acute abnormality. PANCREAS: No acute abnormality. ADRENAL GLANDS: No acute abnormality. KIDNEYS, URETERS AND BLADDER: Stable 11 cm cyst in the mid to lower pole of the right kidney. No stones in the kidneys or ureters. No hydronephrosis. No perinephric or periureteral stranding. Urinary bladder is unremarkable. GI AND BOWEL: Stomach demonstrates no acute abnormality. There is no bowel obstruction. REPRODUCTIVE ORGANS: Radiation seeds in the region of the prostate. PERITONEUM AND  RETROPERITONEUM: No ascites. No free air. VASCULATURE: Aorta is normal in caliber. Aortic atherosclerosis. ABDOMINAL AND PELVIS LYMPH NODES: No lymphadenopathy. BONES AND SOFT TISSUES: Severe chronic compression fracture at L1, unchanged. No focal soft tissue abnormality. IMPRESSION: 1. Moderate to large bilateral pleural effusions with compressive atelectasis in the lower lobes bilaterally. 2. Scattered ground-glass opacities throughout the lungs, possibly reflecting early edema. 3. Mild cardiomegaly. 4. Cholelithiasis. 5. No acute findings in the abdomen or pelvis. Electronically signed by: Franky Crease MD 11/01/2024 08:18 PM EST RP Workstation: [id number]   CT Head Wo Contrast Result Date: 11/01/2024 EXAM: CT HEAD WITHOUT CONTRAST 11/01/2024 08:09:41 PM TECHNIQUE: CT of the head was performed without the administration of intravenous contrast. Automated exposure control, iterative reconstruction, and/or weight based adjustment of the mA/kV was utilized to reduce the radiation dose to as low as reasonably achievable. COMPARISON: CT 09/02/2024 CLINICAL HISTORY: Mental status change, unknown cause. FINDINGS: BRAIN AND VENTRICLES: No acute hemorrhage. No evidence of acute infarct. No hydrocephalus. No extra-axial collection. No mass effect or midline shift. Diffuse cerebral and cerebellar atrophy. Patchy and confluent areas of decreased attenuation are noted throughout the deep and periventricular white matter of the cerebral hemispheres bilaterally, suggestive of chronic microvascular ischemic changes. Atherosclerotic calcifications are present within the cavernous internal carotid and vertebral arteries. ORBITS: No acute abnormality. Bilateral lens replacement. SINUSES: No acute abnormality. SOFT TISSUES AND SKULL: Trace right posterior scalp hematoma (7.36). No skull fracture. IMPRESSION: 1. No acute intracranial abnormality. Electronically signed by: Morgane Naveau MD 11/01/2024 08:16 PM EST RP Workstation:  [id number]   DG Chest Port 1 View Result Date: 11/01/2024 EXAM: 1 VIEW(S) XRAY OF THE CHEST 11/01/2024 06:40:00 PM COMPARISON: 09/02/2024 CLINICAL HISTORY: Questionable sepsis - evaluate for abnormality. FINDINGS: LUNGS AND PLEURA: Consolidation in the left lower lobe concerning for pneumonia. Possible small left effusion. No pulmonary edema. No pneumothorax. HEART AND MEDIASTINUM: Heart is borderline in size. Aortic atherosclerosis. BONES AND SOFT TISSUES: No acute osseous abnormality. IMPRESSION: 1. Left lower lobe consolidation concerning for pneumonia. 2. Possible small left pleural effusion. Electronically signed by: Franky Crease MD 11/01/2024 06:50 PM EST RP Workstation: [id number]    Cardiac Studies     Patient Profile     84 y.o. male with hx of   Assessment & Plan    Lactic acidosis  NSTEMI Chronic microcytic anemia  Pleural effusion bilaterally Multifocal pneumonia History of intermittent complete heart block Diabetes mellitus Hypertension Hyperlipidemia  Newly depressed ejection fraction with wall motion abnormalities. Discuss with the patient and his daughter yesterday - both prefers medical management with no invasive procedures. He offers no complaints today.   Continue with aspirin  and statin.   For GDMT, cr is trending down may be able to start ArB/Arni tomorrow then   Noted  isorhythmic dissociation earlier on presentation but now with sinus. Will continue to monitor. Hold on betal blockers for now.   Noted CKD - avoid nephrotoxins.   Abx per primary team     For questions or updates, please contact [facility] Please consult [email or web address] for contact info under Cardiology/STEMI.      Signed, Stephan Nelis, DO  11/03/2024, 8:03 AM

## 2024-11-03 NOTE — Inpatient Diabetes Management (Signed)
 Inpatient Diabetes Program Recommendations  AACE/ADA: New Consensus Statement on Inpatient Glycemic Control  Target Ranges:  Prepandial:   less than 140 mg/dL      Peak postprandial:   less than 180 mg/dL (1-2 hours)      Critically ill patients:  140 - 180 mg/dL    Latest Reference Range & Units 11/02/24 07:37 11/02/24 12:10 11/02/24 18:29 11/02/24 23:10 11/03/24 05:06 11/03/24 06:05 11/03/24 07:51 11/03/24 11:04  Glucose-Capillary 70 - 99 mg/dL 73 760 (H) 816 (H) 804 (H) 258 (H) 240 (H) 226 (H) 242 (H)    Review of Glycemic Control  Diabetes history: DM2 Outpatient Diabetes medications: Lantus  15 units daily, 70/30 5 units TID with meals, Metformin  500 mg BID, FreeStyle Libre 3 CGM Current orders for Inpatient glycemic control: Novoog 0-6 units Q4H  Inpatient Diabetes Program Recommendations:    Insulin : Please consider ordering Semglee  5 units Q24H.  Thanks Earnie Gainer, RN, MSN, CDCES Diabetes Coordinator Inpatient Diabetes Program [phone] (Team Pager from 8am to 5pm)

## 2024-11-04 DIAGNOSIS — N179 Acute kidney failure, unspecified: Secondary | ICD-10-CM | POA: Diagnosis not present

## 2024-11-04 DIAGNOSIS — J90 Pleural effusion, not elsewhere classified: Secondary | ICD-10-CM | POA: Diagnosis not present

## 2024-11-04 DIAGNOSIS — N170 Acute kidney failure with tubular necrosis: Secondary | ICD-10-CM | POA: Diagnosis not present

## 2024-11-04 LAB — GLUCOSE, CAPILLARY
Glucose-Capillary: 127 mg/dL — ABNORMAL HIGH (ref 70–99)
Glucose-Capillary: 234 mg/dL — ABNORMAL HIGH (ref 70–99)
Glucose-Capillary: 321 mg/dL — ABNORMAL HIGH (ref 70–99)
Glucose-Capillary: 325 mg/dL — ABNORMAL HIGH (ref 70–99)
Glucose-Capillary: 363 mg/dL — ABNORMAL HIGH (ref 70–99)

## 2024-11-04 LAB — MAGNESIUM: Magnesium: 1.8 mg/dL (ref 1.7–2.4)

## 2024-11-04 LAB — BASIC METABOLIC PANEL WITH GFR
Anion gap: 13 (ref 5–15)
BUN: 33 mg/dL — ABNORMAL HIGH (ref 8–23)
CO2: 26 mmol/L (ref 22–32)
Calcium: 9.1 mg/dL (ref 8.9–10.3)
Chloride: 97 mmol/L — ABNORMAL LOW (ref 98–111)
Creatinine, Ser: 1.65 mg/dL — ABNORMAL HIGH (ref 0.61–1.24)
GFR, Estimated: 41 mL/min — ABNORMAL LOW (ref >60)
Glucose, Bld: 118 mg/dL — ABNORMAL HIGH (ref 70–99)
Potassium: 4.3 mmol/L (ref 3.5–5.1)
Sodium: 136 mmol/L (ref 135–145)

## 2024-11-04 NOTE — Inpatient Diabetes Management (Addendum)
 Inpatient Diabetes Program Recommendations  AACE/ADA: New Consensus Statement on Inpatient Glycemic Control   Target Ranges:  Prepandial:   less than 140 mg/dL      Peak postprandial:   less than 180 mg/dL (1-2 hours)      Critically ill patients:  140 - 180 mg/dL   Lab Results  Component Value Date   GLUCAP 127 (H) 11/04/2024   HGBA1C 10.7 (H) 07/21/2024    Latest Reference Range & Units 11/03/24 11:04 11/03/24 16:39 11/03/24 20:05 11/04/24 00:15 11/04/24 04:59  Glucose-Capillary 70 - 99 mg/dL 757 (H) 671 (H) 673 (H) 234 (H) 127 (H)   Review of Glycemic Control  Diabetes history: DM2   Outpatient Diabetes medications:  Lantus  15 units daily 70/30 5 units TID with meals Metformin  500 mg BID FreeStyle Libre 3 CGM   Current orders for Inpatient glycemic control:  Novolog  0-6 units q 4 hrs Semglee  5 units daily   Inpatient Diabetes Program Recommendations:   Please consider: - Order updated Hemoglobin A1C  - Novolog  3 units TID with meals (if patient consumes atleast 50% of meal)   Andre Search, RN, MSN, [facility]  Inpatient Diabetes Coordinator  Pager [phone] (8a-5p)

## 2024-11-04 NOTE — Progress Notes (Signed)
 Occupational Therapy Treatment Patient Details Name: Andre Stout MRN: [id number] DOB: Nov 27, 1940 Today's Date: 11/04/2024   History of present illness 84 y.o. M adm 11/01/24 with AMS, diarrhea, bil pleural effusion, AKI, hyponatremia. PMH: AV block, HTN, T2DM, prostate CA, glaucoma, CHF, CVA, HLD   OT comments  Pt remains pleasantly confused. Pt was found sleeping horizontally across the bed with legs off and head almost falling off the bed and Pt with no awareness. Mentation waxed and wained throughout session. Originally knew he was at [facility] but then mid-in room mobility asked where am I? Pt today was min to mod for transfers with RW, able to complete limited grooming standing at the sink with one instance of LOB which was dependent on therapist to correct to prevent fall. He needs significant assist for LB ADL, and would be unsafe to manage his medicines or fincances at this time. Per chart review he lives with his daughter. If she can provide 24/7 supervision then [facility] is appropriate. If she cannot, then post-acute rehab is appropriate (she was not in room today to confirm). OT will continue to follow acutely. Pt continues to demonstrate decreased strength, cognition, balance, activity tolerance and will benefit from skilled OT acutely and post-acute. Next session plan on OOB activity.      If plan is discharge home, recommend the following:  A lot of help with walking and/or transfers;A lot of help with bathing/dressing/bathroom;Assistance with cooking/housework;Assist for transportation;Help with stairs or ramp for entrance;Supervision due to cognitive status   Equipment Recommendations  BSC/3in1    Recommendations for Other Services      Precautions / Restrictions Precautions Precautions: Fall Recall of Precautions/Restrictions: Impaired Restrictions Weight Bearing Restrictions Per Provider Order: No       Mobility Bed Mobility Overal bed mobility: Needs  Assistance Bed Mobility: Supine to Sit, Sit to Supine     Supine to sit: Mod assist Sit to supine: Min assist, Used rails   General bed mobility comments: Pt reaching out to pull up on therapist, required cues for sequencing and assist with bed pad for in and out of bed    Transfers Overall transfer level: Needs assistance Equipment used: Rolling walker (2 wheels) Transfers: Sit to/from Stand Sit to Stand: Mod assist           General transfer comment: cues for hand placement and backing to surface to sit     Balance Overall balance assessment: Needs assistance Sitting-balance support: No upper extremity supported, Feet supported Sitting balance-Leahy Scale: Fair     Standing balance support: Bilateral upper extremity supported, During functional activity, Reliant on assistive device for balance Standing balance-Leahy Scale: Poor Standing balance comment: reliant on RW for support                           ADL either performed or assessed with clinical judgement   ADL Overall ADL's : Needs assistance/impaired     Grooming: Wash/dry hands;Wash/dry face;Minimal assistance;Standing Grooming Details (indicate cue type and reason): fatigues quickly, very bad balance in standing         Upper Body Dressing : Minimal assistance;Sitting Upper Body Dressing Details (indicate cue type and reason): donning extra gown Lower Body Dressing: Maximal assistance;Sitting/lateral leans Lower Body Dressing Details (indicate cue type and reason): adjust socks Toilet Transfer: Minimal assistance;Rolling walker (2 wheels);Moderate assistance Toilet Transfer Details (indicate cue type and reason): mod A for boost, min A for balance with RW  Functional mobility during ADLs: Minimal assistance;Rolling walker (2 wheels) General ADL Comments: decreased cognition, decreased activity tolerance    Extremity/Trunk Assessment     Lower Extremity Assessment Lower  Extremity Assessment: Defer to PT evaluation        Vision       Perception     Praxis     Communication Communication Communication: Impaired Factors Affecting Communication: Hearing impaired   Cognition Arousal: Alert Behavior During Therapy: WFL for tasks assessed/performed Cognition: Cognition impaired   Orientation impairments: Time, Situation, Place Awareness: Intellectual awareness impaired, Online awareness impaired Memory impairment (select all impairments): Short-term memory, Working memory Attention impairment (select first level of impairment): Sustained attention Executive functioning impairment (select all impairments): Reasoning, Problem solving OT - Cognition Comments: Pt not oriented to situation or time, knows he is in hospital, but later in session asking where am I?. Pt inconsitent historian and needing step by step cues for functional tasks                 Following commands: Impaired Following commands impaired: Follows one step commands with increased time      Cueing   Cueing Techniques: Verbal cues, Gestural cues  Exercises      Shoulder Instructions       General Comments      Pertinent Vitals/ Pain       Pain Assessment Pain Assessment: No/denies pain  Home Living                                          Prior Functioning/Environment              Frequency  Min 2X/week        Progress Toward Goals  OT Goals(current goals can now be found in the care plan section)  Progress towards OT goals: Progressing toward goals  Acute Rehab OT Goals Patient Stated Goal: get back to sleep OT Goal Formulation: With patient Time For Goal Achievement: 11/16/24 Potential to Achieve Goals: Good ADL Goals Pt Will Perform Upper Body Dressing: with set-up Pt Will Perform Lower Body Dressing: with min assist;sit to/from stand Pt Will Transfer to Toilet: with contact guard assist;stand pivot transfer;bedside  commode Pt Will Perform Toileting - Clothing Manipulation and hygiene: with contact guard assist;sit to/from stand  Plan      Co-evaluation                 AM-PAC OT 6 Clicks Daily Activity     Outcome Measure   Help from another person eating meals?: A Little Help from another person taking care of personal grooming?: A Little Help from another person toileting, which includes using toliet, bedpan, or urinal?: A Lot Help from another person bathing (including washing, rinsing, drying)?: A Lot Help from another person to put on and taking off regular upper body clothing?: A Little Help from another person to put on and taking off regular lower body clothing?: A Lot 6 Click Score: 15    End of Session Equipment Utilized During Treatment: Gait belt;Rolling walker (2 wheels)  OT Visit Diagnosis: Unsteadiness on feet (R26.81);Other abnormalities of gait and mobility (R26.89);Muscle weakness (generalized) (M62.81);Other symptoms and signs involving cognitive function   Activity Tolerance Patient tolerated treatment well   Patient Left in bed;with call bell/phone within reach;with bed alarm set (4 rails up)   Nurse Communication Mobility status;Precautions  Time: 8953-8885 OT Time Calculation (min): 28 min  Charges: OT General Charges $OT Visit: 1 Visit OT Treatments $Self Care/Home Management : 8-22 mins $Therapeutic Activity: 8-22 mins  Leita DEL OTR/L Acute Rehabilitation Services Office: [phone]  Leita PARAS [facility] 11/04/2024, 1:59 PM

## 2024-11-04 NOTE — Progress Notes (Signed)
 Progress Note  Patient Name: Andre Stout Date of Encounter: 11/04/2024  Primary Cardiologist: Darryle ONEIDA Decent, MD   Subjective   Patient seen and examined at his bedside. He was awake, sitting in the chair and offers no complaints at this time  Inpatient Medications    Scheduled Meds:  amLODipine   10 mg Oral Daily   aspirin  EC  81 mg Oral Daily   atorvastatin   40 mg Oral Daily   vitamin B-12  1,000 mcg Oral Daily   doxycycline   100 mg Oral BID   feeding supplement (GLUCERNA SHAKE)  237 mL Oral TID BM   ferrous sulfate  325 mg Oral Q breakfast   furosemide  40 mg Intravenous Q12H   heparin  5,000 Units Subcutaneous Q8H   insulin  aspart  0-6 Units Subcutaneous Q4H   insulin  glargine-yfgn  5 Units Subcutaneous Daily   magnesium  oxide  400 mg Oral Daily   pantoprazole   40 mg Oral Daily   senna-docusate  2 tablet Oral QHS   Continuous Infusions:  cefTRIAXone  (ROCEPHIN )  IV Stopped (11/03/24 1734)   PRN Meds: acetaminophen  **OR** acetaminophen , glucagon (human recombinant), hydrALAZINE , ipratropium-albuterol , metoprolol  tartrate   Vital Signs    Vitals:   11/04/24 0014 11/04/24 0100 11/04/24 0453 11/04/24 0722  BP: 128/65  (!) 117/56 (!) 123/55  Pulse: 66  63 67  Resp: 14  16   Temp: (!) 96.8 F (36 C)  97.8 F (36.6 C) (!) 96.8 F (36 C)  TempSrc: Axillary  Oral Axillary  SpO2: 94% 95% 91% 90%  Weight:      Height:        Intake/Output Summary (Last 24 hours) at 11/04/2024 1129 Last data filed at 11/04/2024 0603 Gross per 24 hour  Intake 337 ml  Output 4900 ml  Net -4563 ml   Filed Weights   11/01/24 1745 11/03/24 0600  Weight: 72 kg 67.9 kg    Telemetry     - Personally Reviewed  ECG     - Personally Reviewed  Physical Exam    General: Comfortable Head: Atraumatic, normal size  Eyes: PEERLA, EOMI  Neck: Supple, normal JVD Cardiac: Normal S1, S2; RRR; no murmurs, rubs, or gallops Lungs: Clear to auscultation bilaterally Abd: Soft,  nontender, no hepatomegaly  Ext: warm, no edema Musculoskeletal: No deformities, BUE and BLE strength normal and equal Skin: Warm and dry, no rashes   Neuro: Alert and oriented to person, place, time, and situation, CNII-XII grossly intact, no focal deficits  Psych: Normal mood and affect   Labs    Chemistry Recent Labs  Lab 11/01/24 1830 11/01/24 2136 11/02/24 0416 11/03/24 0657 11/03/24 0809 11/04/24 0640  NA 127*   < > 133* 134* 133* 136  K 5.4*   < > 4.2 4.4 4.4 4.3  CL 94*   < > 103 97* 98 97*  CO2 20*   < > 20* 22 20* 26  GLUCOSE 350*   < > 76 260* 217* 118*  BUN 50*   < > 43* 41* 42* 33*  CREATININE 2.29*   < > 2.04* 1.94* 1.88* 1.65*  CALCIUM  8.8*   < > 7.9* 8.9 8.8* 9.1  PROT 6.9  --  6.0*  --   --   --   ALBUMIN  2.9*  --  2.5*  --   --   --   AST 15  --  13*  --   --   --   ALT 12  --  10  --   --   --   ALKPHOS 78  --  67  --   --   --   BILITOT 0.9  --  0.4  --   --   --   GFRNONAA 28*   < > 32* 34* 35* 41*  ANIONGAP 13   < > 10 15 15 13    < > = values in this interval not displayed.     Hematology Recent Labs  Lab 11/02/24 0416 11/02/24 0845 11/02/24 1318 11/03/24 0330  WBC 6.9  --  6.8 6.6  RBC 3.61* 3.82* 3.64* 3.78*  HGB 9.0*  --  9.3* 9.6*  HCT 28.6*  --  28.7* 29.9*  MCV 79.2*  --  78.8* 79.1*  MCH 24.9*  --  25.5* 25.4*  MCHC 31.5  --  32.4 32.1  RDW 16.7*  --  16.7* 16.9*  PLT 380  --  326 368    Cardiac EnzymesNo results for input(s): TROPONINI in the last 168 hours. No results for input(s): TROPIPOC in the last 168 hours.   BNP Recent Labs  Lab 11/01/24 1830  BNP 2,276.0*     DDimer No results for input(s): DDIMER in the last 168 hours.   Radiology    No results found.   Cardiac Studies     Patient Profile     84 y.o. male with hx of   Assessment & Plan    Lactic acidosis  NSTEMI Chronic microcytic anemia  Pleural effusion bilaterally Multifocal pneumonia History of intermittent complete heart  block Diabetes mellitus Hypertension Hyperlipidemia  Newly depressed ejection fraction with wall motion abnormalities. Discuss with the patient and his daughter yesterday - both prefers medical management with no invasive procedures. He offers no complaints today.   Continue with aspirin  and statin.   Responded well to the Lasix in terms of his pleural effusion.  We can transition him hopefully by tomorrow morning to Lasix 40 mg daily.   For GDMT, cr is trending down may be able to start ArB/Arni hopefully tomorrow.  Noted isorhythmic dissociation earlier on presentation but now with sinus. Will continue to monitor. Hold on betal blockers for now.   Noted CKD - avoid nephrotoxins.   Abx per primary team    For questions or updates, please contact [facility] Please consult [email or web address] for contact info under Cardiology/STEMI.      Signed, Ashantia Amaral, DO  11/04/2024, 11:29 AM

## 2024-11-04 NOTE — Progress Notes (Signed)
 PROGRESS NOTE    Andre Stout  FMW:[id number] DOB: 1940-07-17 DOA: 11/01/2024 PCP: Georgina Leita Norris, DO    Brief Narrative:  84 year old with history of DM2, HTN, chronic anemia, HLD, complete heart block, prostate cancer comes to the ED for poor p.o. intake and diarrhea.  Recently admitted in September for aspiration pneumonia.  Workup in the ER revealed large bilateral pleural effusion concerning for CHF exacerbation, AKI, hyponatremia, elevated BNP, troponin and lactic acidosis.   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  Does have signs of significant bilateral pleural effusion with some compressive atelectasis but fortunately he is comfortable on room air without any severe respiratory distress.  He seems to be tolerating IV diuretics well which we will continue at this time.  He is a poor candidate for any invasive procedures therefore we will proceed with GDMT as appropriate. -Thoracentesis if necessary -Troponins are slowly trending down, CK levels are normal  Acute kidney injury -Baseline creatinine 1.1, admission creatinine 2.29> 1.65  Multifocal pneumonia - Continue bronchodilators as needed, Rocephin /doxycycline  for total 5 days  Diarrhea/poor intake - Sent, results pending  Vitamin B12 deficiency - Supplements  GPC bacteremia - Suspect contaminant  Multifocal pneumonia - Elevated procalcitonin, will continue Rocephin  and azithromycin  Hyponatremia -In the setting of volume overload.  Admission sodium 127 now slowly improving  History of intermittent complete heart block -Followed by cardiology  Anemia of chronic disease with microcytosis - Low iron saturation.  P.o. 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  IV as needed     DVT prophylaxis: heparin injection 5,000 Units Start: 11/01/24 2200      Code Status: Limited: Do not attempt resuscitation (DNR) -DNR-LIMITED -Do Not Intubate/DNI  Family Communication:  Called Billie Ongoing diuresis   PT Follow up Recs: Home Health Pt11/03/2024 1103; F2F complete  Subjective: Seen at bedside resting comfortably.  No complaints.  Follows basic commands.  Alert to name and place   Examination:  General exam: Appears calm and comfortable  Respiratory system: Slightly diminished breath sounds at the bases Cardiovascular system: S1 & S2 heard, RRR. No JVD, murmurs, rubs, gallops or clicks. No pedal edema. Gastrointestinal system: Abdomen is nondistended, soft and nontender. No organomegaly or masses felt. Normal bowel sounds heard. Central nervous system: Alert and oriented. No focal neurological deficits. Extremities: Symmetric 5 x 5 power. Skin: No rashes, lesions or ulcers Psychiatry: Judgement and insight appear poor                Diet Orders (From admission, onward)     Start     Ordered   11/01/24 2121  DIET DYS 2 Room service appropriate? Yes; Fluid consistency: Thin  Diet effective now       Question Answer Comment  Room service appropriate? Yes   Fluid consistency: Thin      11/01/24 2121            Objective: Vitals:   11/04/24 0100 11/04/24 0453 11/04/24 0722 11/04/24 1126  BP:  (!) 117/56 (!) 123/55 (!) 121/55  Pulse:  63 67 68  Resp:  16  19  Temp:  97.8 F (36.6 C) (!) 96.8 F (36 C) 98.1 F (36.7 C)  TempSrc:  Oral Axillary Axillary  SpO2: 95%  91% 90% 91%  Weight:      Height:        Intake/Output Summary (Last 24 hours) at 11/04/2024 1205 Last data filed at 11/04/2024 0603 Gross per 24 hour  Intake 337 ml  Output 4900 ml  Net -4563 ml   Filed Weights   11/01/24 1745 11/03/24 0600  Weight: 72 kg 67.9 kg     Scheduled Meds:  amLODipine   10 mg Oral Daily   aspirin  EC  81 mg Oral Daily   atorvastatin   40 mg Oral Daily   vitamin B-12  1,000 mcg Oral Daily   doxycycline   100 mg Oral BID   feeding supplement (GLUCERNA SHAKE)  237 mL Oral TID BM   ferrous sulfate  325 mg Oral Q breakfast   furosemide  40 mg Intravenous Q12H   heparin  5,000 Units Subcutaneous Q8H   insulin  aspart  0-6 Units Subcutaneous Q4H   insulin  glargine-yfgn  5 Units Subcutaneous Daily   magnesium  oxide  400 mg Oral Daily   pantoprazole   40 mg Oral Daily   senna-docusate  2 tablet Oral QHS   Continuous Infusions:  cefTRIAXone  (ROCEPHIN )  IV Stopped (11/03/24 1734)    Nutritional status Signs/Symptoms: mild fat depletion, moderate muscle depletion Interventions: Glucerna shake Body mass index is 29.23 kg/m.  Data Reviewed:   CBC: Recent Labs  Lab 11/01/24 1830 11/01/24 2136 11/02/24 0416 11/02/24 1318 11/03/24 0330  WBC 8.0 7.7 6.9 6.8 6.6  NEUTROABS 6.2  --  4.2  --   --   HGB 10.7* 9.8* 9.0* 9.3* 9.6*  HCT 34.3* 31.2* 28.6* 28.7* 29.9*  MCV 80.1 79.6* 79.2* 78.8* 79.1*  PLT 397 367 380 326 368   Basic Metabolic Panel: Recent Labs  Lab 11/01/24 2136 11/02/24 0416 11/03/24 0657 11/03/24 0809 11/04/24 0640  NA 128* 133* 134* 133* 136  K 4.8 4.2 4.4 4.4 4.3  CL 96* 103 97* 98 97*  CO2 19* 20* 22 20* 26  GLUCOSE 288* 76 260* 217* 118*  BUN 48* 43* 41* 42* 33*  CREATININE 2.26* 2.04* 1.94* 1.88* 1.65*  CALCIUM  8.2* 7.9* 8.9 8.8* 9.1  MG 2.2  --  2.0  --  1.8  PHOS  --   --  3.9  --   --    GFR: Estimated Creatinine Clearance: 27.4 mL/min (A) (by C-G formula based on SCr of 1.65 mg/dL (H)). Liver Function Tests: Recent Labs  Lab 11/01/24 1830 11/02/24 0416  AST 15 13*  ALT 12 10  ALKPHOS 78 67  BILITOT 0.9 0.4  PROT 6.9 6.0*  ALBUMIN  2.9* 2.5*   No results for input(s): LIPASE, AMYLASE in the last 168 hours. Recent Labs  Lab 11/02/24 0845  AMMONIA <13   Coagulation  Profile: Recent Labs  Lab 11/01/24 1830  INR 1.2   Cardiac Enzymes: Recent Labs  Lab 11/01/24 2136  CKTOTAL 37*   BNP (last 3 results) No results for input(s): PROBNP in the last 8760 hours. HbA1C: No results for input(s): HGBA1C in the last 72 hours. CBG: Recent Labs  Lab 11/03/24 1639 11/03/24 2005 11/04/24 0015 11/04/24 0459 11/04/24 1129  GLUCAP 328* 326* 234* 127* 325*   Lipid Profile: No results for input(s): CHOL, HDL, LDLCALC, TRIG, CHOLHDL, LDLDIRECT in the last 72 hours. Thyroid  Function Tests: Recent Labs    11/02/24 0845  TSH 4.170   Anemia Panel: Recent Labs    11/01/24 2136 11/02/24 0845 11/03/24 0657  VITAMINB12  --  <150*  --  FOLATE  --  8.0 8.7  FERRITIN 156  --   --   TIBC 217*  --   --   IRON 28*  --   --   RETICCTPCT  --  0.8  --    Sepsis Labs: Recent Labs  Lab 11/01/24 1826 11/01/24 2023 11/02/24 0845 11/02/24 0859  PROCALCITON  --   --  5.63  --   LATICACIDVEN 3.6* 2.9*  --  1.8    Recent Results (from the past 240 hours)  Resp panel by RT-PCR (RSV, Flu A&B, Covid) Anterior Nasal Swab     Status: None   Collection Time: 11/01/24  5:50 PM   Specimen: Anterior Nasal Swab  Result Value Ref Range Status   SARS Coronavirus 2 by RT PCR NEGATIVE NEGATIVE Final   Influenza A by PCR NEGATIVE NEGATIVE Final   Influenza B by PCR NEGATIVE NEGATIVE Final    Comment: (NOTE) The Xpert Xpress SARS-CoV-2/FLU/RSV plus assay is intended as an aid in the diagnosis of influenza from Nasopharyngeal swab specimens and should not be used as a sole basis for treatment. Nasal washings and aspirates are unacceptable for Xpert Xpress SARS-CoV-2/FLU/RSV testing.  Fact Sheet for Patients: bloggercourse.com  Fact Sheet for Healthcare Providers: seriousbroker.it  This test is not yet approved or cleared by the [place]  FDA and has been authorized for detection and/or  diagnosis of SARS-CoV-2 by FDA under an Emergency Use Authorization (EUA). This EUA will remain in effect (meaning this test can be used) for the duration of the COVID-19 declaration under Section 564(b)(1) of the Act, 21 U.S.C. section 360bbb-3(b)(1), unless the authorization is terminated or revoked.     Resp Syncytial Virus by PCR NEGATIVE NEGATIVE Final    Comment: (NOTE) Fact Sheet for Patients: bloggercourse.com  Fact Sheet for Healthcare Providers: seriousbroker.it  This test is not yet approved or cleared by the [place]  FDA and has been authorized for detection and/or diagnosis of SARS-CoV-2 by FDA under an Emergency Use Authorization (EUA). This EUA will remain in effect (meaning this test can be used) for the duration of the COVID-19 declaration under Section 564(b)(1) of the Act, 21 U.S.C. section 360bbb-3(b)(1), unless the authorization is terminated or revoked.  Performed at [facility] Lab, [address], [place], [place] [address]   Blood Culture (routine x 2)     Status: None (Preliminary result)   Collection Time: 11/01/24  5:55 PM   Specimen: BLOOD  Result Value Ref Range Status   Specimen Description BLOOD LEFT ANTECUBITAL  Final   Special Requests   Final    BOTTLES DRAWN AEROBIC AND ANAEROBIC Blood Culture results may not be optimal due to an inadequate volume of blood received in culture bottles   Culture   Final    NO GROWTH 3 DAYS Performed at [facility], [address] [place], [place] [address]    Report Status PENDING  Incomplete  Blood Culture (routine x 2)     Status: Abnormal   Collection Time: 11/01/24  6:30 PM   Specimen: BLOOD LEFT FOREARM  Result Value Ref Range Status   Specimen Description BLOOD LEFT FOREARM  Final   Special Requests   Final    BOTTLES DRAWN AEROBIC AND ANAEROBIC Blood Culture results may not be optimal due to an inadequate volume of blood received in  culture bottles   Culture  Setup Time   Final    GRAM POSITIVE COCCI IN CLUSTERS AEROBIC BOTTLE ONLY CRITICAL  RESULT CALLED TO, READ BACK BY AND VERIFIED WITH: PHARMD EMILY S [phone] FCP    Culture (A)  Final    STAPHYLOCOCCUS HAEMOLYTICUS THE SIGNIFICANCE OF ISOLATING THIS ORGANISM FROM A SINGLE SET OF BLOOD CULTURES WHEN MULTIPLE SETS ARE DRAWN IS UNCERTAIN. PLEASE NOTIFY THE MICROBIOLOGY DEPARTMENT WITHIN ONE WEEK IF SPECIATION AND SENSITIVITIES ARE REQUIRED. Performed at [facility], [address] [place], [place] [address]    Report Status 11/03/2024 FINAL  Final  Blood Culture ID Panel (Reflexed)     Status: Abnormal   Collection Time: 11/01/24  6:30 PM  Result Value Ref Range Status   Enterococcus faecalis NOT DETECTED NOT DETECTED Final   Enterococcus Faecium NOT DETECTED NOT DETECTED Final   Listeria monocytogenes NOT DETECTED NOT DETECTED Final   Staphylococcus species DETECTED (A) NOT DETECTED Final    Comment: CRITICAL RESULT CALLED TO, READ BACK BY AND VERIFIED WITH: PHARMD EMILY S [phone] FCP    Staphylococcus aureus (BCID) NOT DETECTED NOT DETECTED Final   Staphylococcus epidermidis NOT DETECTED NOT DETECTED Final   Staphylococcus lugdunensis NOT DETECTED NOT DETECTED Final   Streptococcus species NOT DETECTED NOT DETECTED Final   Streptococcus agalactiae NOT DETECTED NOT DETECTED Final   Streptococcus pneumoniae NOT DETECTED NOT DETECTED Final   Streptococcus pyogenes NOT DETECTED NOT DETECTED Final   A.calcoaceticus-baumannii NOT DETECTED NOT DETECTED Final   Bacteroides fragilis NOT DETECTED NOT DETECTED Final   Enterobacterales NOT DETECTED NOT DETECTED Final   Enterobacter cloacae complex NOT DETECTED NOT DETECTED Final   Escherichia coli NOT DETECTED NOT DETECTED Final   Klebsiella aerogenes NOT DETECTED NOT DETECTED Final   Klebsiella oxytoca NOT DETECTED NOT DETECTED Final   Klebsiella pneumoniae NOT DETECTED NOT DETECTED Final   Proteus  species NOT DETECTED NOT DETECTED Final   Salmonella species NOT DETECTED NOT DETECTED Final   Serratia marcescens NOT DETECTED NOT DETECTED Final   Haemophilus influenzae NOT DETECTED NOT DETECTED Final   Neisseria meningitidis NOT DETECTED NOT DETECTED Final   Pseudomonas aeruginosa NOT DETECTED NOT DETECTED Final   Stenotrophomonas maltophilia NOT DETECTED NOT DETECTED Final   Candida albicans NOT DETECTED NOT DETECTED Final   Candida auris NOT DETECTED NOT DETECTED Final   Candida glabrata NOT DETECTED NOT DETECTED Final   Candida krusei NOT DETECTED NOT DETECTED Final   Candida parapsilosis NOT DETECTED NOT DETECTED Final   Candida tropicalis NOT DETECTED NOT DETECTED Final   Cryptococcus neoformans/gattii NOT DETECTED NOT DETECTED Final    Comment: Performed at [facility], [address], [place], [place] [address]         Radiology Studies: No results found.         LOS: 3 days   Time spent= 35 mins    Burgess JAYSON Dare, MD [facility]  If 7PM-7AM, please contact night-coverage  11/04/2024, 12:05 PM

## 2024-11-04 NOTE — Plan of Care (Signed)

## 2024-11-04 NOTE — Progress Notes (Signed)
 Heart Failure Navigator Progress Note  Assessed for Heart & Vascular TOC clinic readiness.  Patient does not meet criteria due to has a scheduled [facility] appointment on 11/20/2024. No HF TOC. .   Navigator will sign off at this time.   Stephane Haddock, BSN, Scientist, Clinical (histocompatibility And Immunogenetics) Only

## 2024-11-05 ENCOUNTER — Inpatient Hospital Stay (HOSPITAL_COMMUNITY)

## 2024-11-05 DIAGNOSIS — N179 Acute kidney failure, unspecified: Secondary | ICD-10-CM | POA: Diagnosis not present

## 2024-11-05 DIAGNOSIS — N178 Other acute kidney failure: Secondary | ICD-10-CM | POA: Diagnosis not present

## 2024-11-05 DIAGNOSIS — I442 Atrioventricular block, complete: Secondary | ICD-10-CM

## 2024-11-05 LAB — BASIC METABOLIC PANEL WITH GFR
Anion gap: 13 (ref 5–15)
Anion gap: 15 (ref 5–15)
BUN: 31 mg/dL — ABNORMAL HIGH (ref 8–23)
BUN: 35 mg/dL — ABNORMAL HIGH (ref 8–23)
CO2: 26 mmol/L (ref 22–32)
CO2: 27 mmol/L (ref 22–32)
Calcium: 8.7 mg/dL — ABNORMAL LOW (ref 8.9–10.3)
Calcium: 9.2 mg/dL (ref 8.9–10.3)
Chloride: 98 mmol/L (ref 98–111)
Chloride: 93 mmol/L — ABNORMAL LOW (ref 98–111)
Creatinine, Ser: 1.69 mg/dL — ABNORMAL HIGH (ref 0.61–1.24)
Creatinine, Ser: 1.75 mg/dL — ABNORMAL HIGH (ref 0.61–1.24)
GFR, Estimated: 40 mL/min — ABNORMAL LOW (ref >60)
GFR, Estimated: 38 mL/min — ABNORMAL LOW (ref >60)
Glucose, Bld: 159 mg/dL — ABNORMAL HIGH (ref 70–99)
Glucose, Bld: 365 mg/dL — ABNORMAL HIGH (ref 70–99)
Potassium: 3.9 mmol/L (ref 3.5–5.1)
Potassium: 4.7 mmol/L (ref 3.5–5.1)
Sodium: 137 mmol/L (ref 135–145)
Sodium: 135 mmol/L (ref 135–145)

## 2024-11-05 LAB — GLUCOSE, CAPILLARY
Glucose-Capillary: 107 mg/dL — ABNORMAL HIGH (ref 70–99)
Glucose-Capillary: 125 mg/dL — ABNORMAL HIGH (ref 70–99)
Glucose-Capillary: 281 mg/dL — ABNORMAL HIGH (ref 70–99)
Glucose-Capillary: 285 mg/dL — ABNORMAL HIGH (ref 70–99)
Glucose-Capillary: 324 mg/dL — ABNORMAL HIGH (ref 70–99)
Glucose-Capillary: 402 mg/dL — ABNORMAL HIGH (ref 70–99)
Glucose-Capillary: 500 mg/dL — ABNORMAL HIGH (ref 70–99)
Glucose-Capillary: 511 mg/dL (ref 70–99)
Glucose-Capillary: 94 mg/dL (ref 70–99)

## 2024-11-05 LAB — MAGNESIUM: Magnesium: 1.5 mg/dL — ABNORMAL LOW (ref 1.7–2.4)

## 2024-11-05 MED ORDER — INSULIN ASPART 100 UNIT/ML IJ SOLN
INTRAMUSCULAR | Status: AC
  Filled 2024-11-05: qty 12

## 2024-11-05 MED ORDER — MAGNESIUM OXIDE -MG SUPPLEMENT 400 (240 MG) MG PO TABS
800.0000 mg | ORAL_TABLET | Freq: Once | ORAL | Status: AC
  Administered 2024-11-05: 800 mg via ORAL
  Filled 2024-11-05: qty 2

## 2024-11-05 MED ORDER — MAGNESIUM SULFATE 2 GM/50ML IV SOLN
2.0000 g | Freq: Once | INTRAVENOUS | Status: AC
  Administered 2024-11-05: 2 g via INTRAVENOUS
  Filled 2024-11-05: qty 50

## 2024-11-05 MED ORDER — INSULIN ASPART 100 UNIT/ML IJ SOLN
12.0000 [IU] | Freq: Once | INTRAMUSCULAR | Status: AC
  Administered 2024-11-05: 12 [IU] via SUBCUTANEOUS

## 2024-11-05 MED ORDER — INSULIN ASPART 100 UNIT/ML IJ SOLN
8.0000 [IU] | Freq: Three times a day (TID) | INTRAMUSCULAR | Status: DC
  Filled 2024-11-05: qty 8

## 2024-11-05 MED ORDER — INSULIN ASPART 100 UNIT/ML IJ SOLN
12.0000 [IU] | Freq: Once | INTRAMUSCULAR | Status: AC
  Administered 2024-11-05: 12 [IU] via SUBCUTANEOUS
  Filled 2024-11-05: qty 12

## 2024-11-05 MED ORDER — INSULIN ASPART 100 UNIT/ML IJ SOLN
3.0000 [IU] | Freq: Three times a day (TID) | INTRAMUSCULAR | Status: DC
  Administered 2024-11-05: 3 [IU] via SUBCUTANEOUS
  Filled 2024-11-05: qty 3

## 2024-11-05 NOTE — Progress Notes (Signed)
 Nurse requested Mobility Specialist to perform oxygen saturation test with pt which includes removing pt from oxygen both at rest and while ambulating.  Below are the results from that testing.     Patient Saturations on Room Air at Rest = spO2 96%  Patient Saturations on Room Air while Ambulating = sp02 94% .    Patient Saturations on 0 Liters of oxygen while Ambulating = sp02 94%  At end of testing pt left in room on 0  Liters of oxygen.  Reported results to nurse.

## 2024-11-05 NOTE — Inpatient Diabetes Management (Signed)
 Inpatient Diabetes Program Recommendations  AACE/ADA: New Consensus Statement on Inpatient Glycemic Control (2015)  Target Ranges:  Prepandial:   less than 140 mg/dL      Peak postprandial:   less than 180 mg/dL (1-2 hours)      Critically ill patients:  140 - 180 mg/dL   Lab Results  Component Value Date   GLUCAP 324 (H) 11/05/2024   HGBA1C 10.7 (H) 07/21/2024    Review of Glycemic Control  Latest Reference Range & Units 11/04/24 04:59 11/04/24 11:29 11/04/24 15:52 11/04/24 20:22 11/05/24 00:26 11/05/24 04:39 11/05/24 07:43 11/05/24 10:52  Glucose-Capillary 70 - 99 mg/dL 872 (H) 674 (H) 678 (H) 363 (H) 281 (H) 94 107 (H) 324 (H)  (H): Data is abnormally high  Diabetes history: DM2    Outpatient Diabetes medications:  Lantus  15 units daily 70/30 5 units TID with meals Metformin  500 mg BID FreeStyle Libre 3 CGM    Current orders for Inpatient glycemic control:  Novolog  0-6 units q 4 hrs Semglee  5 units daily   Inpatient Diabetes Program Recommendations:    Please consider:  Novolog  3 units TID with meals if he consumes at least 50%.  Thank you, Wyvonna Pinal, MSN, CDCES Diabetes Coordinator Inpatient Diabetes Program [phone] (team pager from 8a-5p)

## 2024-11-05 NOTE — Plan of Care (Signed)

## 2024-11-05 NOTE — Progress Notes (Signed)
 Mobility Specialist Progress Note:    11/05/24 1225  Mobility  Activity Ambulated with assistance  Level of Assistance Minimal assist, patient does 75% or more  Assistive Device Front wheel walker  Distance Ambulated (ft) 25 ft  Activity Response Tolerated fair  Mobility Referral Yes  Mobility visit 1 Mobility  Mobility Specialist Start Time (ACUTE ONLY) 1225  Mobility Specialist Stop Time (ACUTE ONLY) 1244  Mobility Specialist Time Calculation (min) (ACUTE ONLY) 19 min   Received pt sleeping in bed being requested to do Ambulatory O2 walk. Pt difficult to awaken and arouse. Pt c/o fatigue and being sleepy. Pt needing MinA to transfer to EOB, stand, and ambulate. Pt seemed somewhat confused about surrounding but otherwise able to ambulate. Returned pt to EOB to eat lunch w/ all needs met. NT assisted throughout session.   Venetia Keel Mobility Specialist Please Neurosurgeon or Rehab Office at [phone]

## 2024-11-05 NOTE — Progress Notes (Signed)
 Progress Note  Patient Name: Andre Stout Date of Encounter: 11/05/2024  Primary Cardiologist: Darryle ONEIDA Decent, MD   Subjective   Patient seen and examined at his bedside. He was awake, sitting in the chair and offers no complaints at this time  Inpatient Medications    Scheduled Meds:  amLODipine   10 mg Oral Daily   aspirin  EC  81 mg Oral Daily   atorvastatin   40 mg Oral Daily   vitamin B-12  1,000 mcg Oral Daily   doxycycline   100 mg Oral BID   feeding supplement (GLUCERNA SHAKE)  237 mL Oral TID BM   ferrous sulfate  325 mg Oral Q breakfast   furosemide  40 mg Intravenous Q12H   heparin  5,000 Units Subcutaneous Q8H   insulin  aspart  0-6 Units Subcutaneous Q4H   insulin  glargine-yfgn  5 Units Subcutaneous Daily   magnesium  oxide  800 mg Oral Once   pantoprazole   40 mg Oral Daily   senna-docusate  2 tablet Oral QHS   Continuous Infusions:  cefTRIAXone  (ROCEPHIN )  IV 2 g (11/04/24 2034)   magnesium  sulfate bolus IVPB     PRN Meds: acetaminophen  **OR** acetaminophen , glucagon (human recombinant), hydrALAZINE , ipratropium-albuterol , metoprolol  tartrate   Vital Signs    Vitals:   11/04/24 1942 11/05/24 0002 11/05/24 0400 11/05/24 0745  BP: (!) 112/57 (!) 112/54 128/69 118/61  Pulse: 69 61 64 65  Resp: 16 17 16 16   Temp: (!) 97.5 F (36.4 C) (!) 97.2 F (36.2 C) 97.9 F (36.6 C) 98 F (36.7 C)  TempSrc: Oral Oral Oral Oral  SpO2: 95% 96% 98% 98%  Weight:      Height:        Intake/Output Summary (Last 24 hours) at 11/05/2024 0910 Last data filed at 11/05/2024 0300 Gross per 24 hour  Intake 360 ml  Output 2750 ml  Net -2390 ml   Filed Weights   11/01/24 1745 11/03/24 0600  Weight: 72 kg 67.9 kg    Telemetry     - Personally Reviewed  ECG     - Personally Reviewed  Physical Exam    General: Comfortable Head: Atraumatic, normal size  Eyes: PEERLA, EOMI  Neck: Supple, normal JVD Cardiac: Normal S1, S2; RRR; no murmurs, rubs, or  gallops Lungs: Clear to auscultation bilaterally Abd: Soft, nontender, no hepatomegaly  Ext: warm, no edema Musculoskeletal: No deformities, BUE and BLE strength normal and equal Skin: Warm and dry, no rashes   Neuro: Alert and oriented to person, place, time, and situation, CNII-XII grossly intact, no focal deficits  Psych: Normal mood and affect   Labs    Chemistry Recent Labs  Lab 11/01/24 1830 11/01/24 2136 11/02/24 0416 11/03/24 0657 11/03/24 0809 11/04/24 0640 11/05/24 0255  NA 127*   < > 133*   < > 133* 136 137  K 5.4*   < > 4.2   < > 4.4 4.3 3.9  CL 94*   < > 103   < > 98 97* 98  CO2 20*   < > 20*   < > 20* 26 26  GLUCOSE 350*   < > 76   < > 217* 118* 159*  BUN 50*   < > 43*   < > 42* 33* 31*  CREATININE 2.29*   < > 2.04*   < > 1.88* 1.65* 1.69*  CALCIUM  8.8*   < > 7.9*   < > 8.8* 9.1 8.7*  PROT 6.9  --  6.0*  --   --   --   --   ALBUMIN  2.9*  --  2.5*  --   --   --   --   AST 15  --  13*  --   --   --   --   ALT 12  --  10  --   --   --   --   ALKPHOS 78  --  67  --   --   --   --   BILITOT 0.9  --  0.4  --   --   --   --   GFRNONAA 28*   < > 32*   < > 35* 41* 40*  ANIONGAP 13   < > 10   < > 15 13 13    < > = values in this interval not displayed.     Hematology Recent Labs  Lab 11/02/24 0416 11/02/24 0845 11/02/24 1318 11/03/24 0330  WBC 6.9  --  6.8 6.6  RBC 3.61* 3.82* 3.64* 3.78*  HGB 9.0*  --  9.3* 9.6*  HCT 28.6*  --  28.7* 29.9*  MCV 79.2*  --  78.8* 79.1*  MCH 24.9*  --  25.5* 25.4*  MCHC 31.5  --  32.4 32.1  RDW 16.7*  --  16.7* 16.9*  PLT 380  --  326 368    Cardiac EnzymesNo results for input(s): TROPONINI in the last 168 hours. No results for input(s): TROPIPOC in the last 168 hours.   BNP Recent Labs  Lab 11/01/24 1830  BNP 2,276.0*     DDimer No results for input(s): DDIMER in the last 168 hours.   Radiology    No results found.   Cardiac Studies     Patient Profile     84 y.o. male with hx of   Assessment &  Plan    Lactic acidosis  NSTEMI Chronic microcytic anemia  Pleural effusion bilaterally Multifocal pneumonia History of intermittent complete heart block Diabetes mellitus Hypertension Hyperlipidemia  Newly depressed ejection fraction with wall motion abnormalities. Discuss with the patient and his daughter yesterday - both prefers medical management with no invasive procedures. He offers no complaints today.   Continue with aspirin  and statin.   Responded well to the Lasix in terms of his pleural effusion.  We can transition him hopefully by tomorrow morning to Lasix 40 mg daily.   For GDMT, cr is trending down may be able to start ArB/Arni hopefully tomorrow.  Complete heart block/isorhythmic dissociation - this is not new, had this issue sometime back and saw EP in the outpatient setting. Discussed with him today the from most recent monitor. Not interested in moving forward with an invasive intervention/does not want a pacemaker. Will continue to monitor. Hold on betal blockers for now.   Noted CKD - avoid nephrotoxins.   Abx per primary team   For questions or updates, please contact [facility] Please consult [email or web address] for contact info under Cardiology/STEMI.      Signed, Kamari Buch, DO  11/05/2024, 9:10 AM

## 2024-11-05 NOTE — Progress Notes (Signed)
 Andre Stout    CHOSEN GESKE  FMW:[id number] DOB: 1940/09/16 DOA: 11/01/2024 PCP: Georgina Leita Norris, DO    Brief Narrative:  84 year old with history of DM2, HTN, chronic anemia, HLD, complete heart block, prostate cancer comes to the ED for poor p.o. intake and diarrhea.  Recently admitted in September for aspiration pneumonia.  Workup in the ER revealed large bilateral pleural effusion concerning for CHF exacerbation, AKI, hyponatremia, elevated BNP, troponin and lactic acidosis.   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  Does have signs of significant bilateral pleural effusion with some compressive atelectasis but fortunately he is comfortable on room air without any severe respiratory distress.  He seems to be tolerating IV diuretics well which we will continue at this time.  He is a poor candidate for any invasive procedures therefore we will proceed with GDMT as appropriate. -Thoracentesis if necessary -Troponins are slowly trending down, CK levels are normal  Acute kidney injury -Baseline creatinine 1.1, admission creatinine 2.29> 1.65  Multifocal pneumonia - Continue bronchodilators as needed, Rocephin /doxycycline  for total 5 days  Diarrhea/poor intake - Sent, results pending  Vitamin B12 deficiency - Supplements  GPC bacteremia - Suspect contaminant  Multifocal pneumonia - Elevated procalcitonin, will continue Rocephin  and azithromycin  Hyponatremia -In the setting of volume overload.  Admission sodium 127 now slowly improving  History of intermittent complete heart block -Followed by cardiology  Anemia of chronic disease with microcytosis - Low iron saturation.  P.o. 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  IV as needed     DVT prophylaxis: heparin injection 5,000 Units Start: 11/01/24 2200      Code Status: Limited: Do not attempt resuscitation (DNR) -DNR-LIMITED -Do Not Intubate/DNI  Family Communication:  Called Billie Ongoing diuresis, hopefully home in next 24-48 hours   PT Follow up Recs: Home Health Pt11/03/2024 1103; F2F complete  Subjective: Seen at bedside resting comfortably.  No complaints.  Follows basic commands.  Alert to name and place Appears very comfortable otherwise  Examination:  General exam: Appears calm and comfortable  Respiratory system: Slightly diminished breath sounds at the bases Cardiovascular system: S1 & S2 heard, RRR. No JVD, murmurs, rubs, gallops or clicks. No pedal edema. Gastrointestinal system: Abdomen is nondistended, soft and nontender. No organomegaly or masses felt. Normal bowel sounds heard. Central nervous system: Alert and oriented. No focal neurological deficits. Extremities: Symmetric 5 x 5 power. Skin: No rashes, lesions or ulcers Psychiatry: Judgement and insight appear poor                Diet Orders (From admission, onward)     Start     Ordered   11/01/24 2121  DIET DYS 2 Room service appropriate? Yes; Fluid consistency: Thin  Diet effective now       Question Answer Comment  Room service appropriate? Yes   Fluid consistency: Thin      11/01/24 2121            Objective: Vitals:   11/05/24 0002 11/05/24 0400 11/05/24 0745 11/05/24 1049  BP: (!) 112/54 128/69 118/61 (!) 113/53  Pulse: 61 64 65 62  Resp: 17 16 16 16   Temp: (!) 97.2 F (36.2 C) 97.9 F (36.6 C) 98 F (36.7 C) 97.6 F (  36.4 C)  TempSrc: Oral Oral Oral Oral  SpO2: 96% 98% 98% 95%  Weight:      Height:        Intake/Output Summary (Last 24 hours) at 11/05/2024 1213 Last data filed at 11/05/2024 1042 Gross per 24 hour  Intake 957 ml  Output 3250 ml   Net -2293 ml   Filed Weights   11/01/24 1745 11/03/24 0600  Weight: 72 kg 67.9 kg    Scheduled Meds:  amLODipine   10 mg Oral Daily   aspirin  EC  81 mg Oral Daily   atorvastatin   40 mg Oral Daily   vitamin B-12  1,000 mcg Oral Daily   doxycycline   100 mg Oral BID   feeding supplement (GLUCERNA SHAKE)  237 mL Oral TID BM   ferrous sulfate  325 mg Oral Q breakfast   furosemide  40 mg Intravenous Q12H   heparin  5,000 Units Subcutaneous Q8H   insulin  aspart  0-6 Units Subcutaneous Q4H   insulin  aspart  3 Units Subcutaneous TID WC   insulin  glargine-yfgn  5 Units Subcutaneous Daily   pantoprazole   40 mg Oral Daily   senna-docusate  2 tablet Oral QHS   Continuous Infusions:  cefTRIAXone  (ROCEPHIN )  IV 2 g (11/04/24 2034)    Nutritional status Signs/Symptoms: mild fat depletion, moderate muscle depletion Interventions: Glucerna shake Body mass index is 29.23 kg/m.  Data Reviewed:   CBC: Recent Labs  Lab 11/01/24 1830 11/01/24 2136 11/02/24 0416 11/02/24 1318 11/03/24 0330  WBC 8.0 7.7 6.9 6.8 6.6  NEUTROABS 6.2  --  4.2  --   --   HGB 10.7* 9.8* 9.0* 9.3* 9.6*  HCT 34.3* 31.2* 28.6* 28.7* 29.9*  MCV 80.1 79.6* 79.2* 78.8* 79.1*  PLT 397 367 380 326 368   Basic Metabolic Panel: Recent Labs  Lab 11/01/24 2136 11/02/24 0416 11/03/24 0657 11/03/24 0809 11/04/24 0640 11/05/24 0255  NA 128* 133* 134* 133* 136 137  K 4.8 4.2 4.4 4.4 4.3 3.9  CL 96* 103 97* 98 97* 98  CO2 19* 20* 22 20* 26 26  GLUCOSE 288* 76 260* 217* 118* 159*  BUN 48* 43* 41* 42* 33* 31*  CREATININE 2.26* 2.04* 1.94* 1.88* 1.65* 1.69*  CALCIUM  8.2* 7.9* 8.9 8.8* 9.1 8.7*  MG 2.2  --  2.0  --  1.8 1.5*  PHOS  --   --  3.9  --   --   --    GFR: Estimated Creatinine Clearance: 26.8 mL/min (A) (by C-G formula based on SCr of 1.69 mg/dL (H)). Liver Function Tests: Recent Labs  Lab 11/01/24 1830 11/02/24 0416  AST 15 13*  ALT 12 10  ALKPHOS 78 67  BILITOT 0.9 0.4  PROT 6.9 6.0*   ALBUMIN  2.9* 2.5*   No results for input(s): LIPASE, AMYLASE in the last 168 hours. Recent Labs  Lab 11/02/24 0845  AMMONIA <13   Coagulation Profile: Recent Labs  Lab 11/01/24 1830  INR 1.2   Cardiac Enzymes: Recent Labs  Lab 11/01/24 2136  CKTOTAL 37*   BNP (last 3 results) No results for input(s): PROBNP in the last 8760 hours. HbA1C: No results for input(s): HGBA1C in the last 72 hours. CBG: Recent Labs  Lab 11/04/24 2022 11/05/24 0026 11/05/24 0439 11/05/24 0743 11/05/24 1052  GLUCAP 363* 281* 94 107* 324*   Lipid Profile: No results for input(s): CHOL, HDL, LDLCALC, TRIG, CHOLHDL, LDLDIRECT in the last 72 hours. Thyroid  Function Tests: No results for input(s): TSH,  T4TOTAL, FREET4, T3FREE, THYROIDAB in the last 72 hours. Anemia Panel: Recent Labs    11/03/24 0657  FOLATE 8.7   Sepsis Labs: Recent Labs  Lab 11/01/24 1826 11/01/24 2023 11/02/24 0845 11/02/24 0859  PROCALCITON  --   --  5.63  --   LATICACIDVEN 3.6* 2.9*  --  1.8    Recent Results (from the past 240 hours)  Resp panel by RT-PCR (RSV, Flu A&B, Covid) Anterior Nasal Swab     Status: None   Collection Time: 11/01/24  5:50 PM   Specimen: Anterior Nasal Swab  Result Value Ref Range Status   SARS Coronavirus 2 by RT PCR NEGATIVE NEGATIVE Final   Influenza A by PCR NEGATIVE NEGATIVE Final   Influenza B by PCR NEGATIVE NEGATIVE Final    Comment: (Stout) The Xpert Xpress SARS-CoV-2/FLU/RSV plus assay is intended as an aid in the diagnosis of influenza from Nasopharyngeal swab specimens and should not be used as a sole basis for treatment. Nasal washings and aspirates are unacceptable for Xpert Xpress SARS-CoV-2/FLU/RSV testing.  Fact Sheet for Patients: bloggercourse.com  Fact Sheet for Healthcare Providers: seriousbroker.it  This test is not yet approved or cleared by the [place]  FDA and has  been authorized for detection and/or diagnosis of SARS-CoV-2 by FDA under an Emergency Use Authorization (EUA). This EUA will remain in effect (meaning this test can be used) for the duration of the COVID-19 declaration under Section 564(b)(1) of the Act, 21 U.S.C. section 360bbb-3(b)(1), unless the authorization is terminated or revoked.     Resp Syncytial Virus by PCR NEGATIVE NEGATIVE Final    Comment: (Stout) Fact Sheet for Patients: bloggercourse.com  Fact Sheet for Healthcare Providers: seriousbroker.it  This test is not yet approved or cleared by the [place]  FDA and has been authorized for detection and/or diagnosis of SARS-CoV-2 by FDA under an Emergency Use Authorization (EUA). This EUA will remain in effect (meaning this test can be used) for the duration of the COVID-19 declaration under Section 564(b)(1) of the Act, 21 U.S.C. section 360bbb-3(b)(1), unless the authorization is terminated or revoked.  Performed at [facility], [address], [place], [place] [address]   Blood Culture (routine x 2)     Status: None (Preliminary result)   Collection Time: 11/01/24  5:55 PM   Specimen: BLOOD  Result Value Ref Range Status   Specimen Description BLOOD LEFT ANTECUBITAL  Final   Special Requests   Final    BOTTLES DRAWN AEROBIC AND ANAEROBIC Blood Culture results may not be optimal due to an inadequate volume of blood received in culture bottles   Culture   Final    NO GROWTH 4 DAYS Performed at [facility], [address], [place], [place] [address]    Report Status PENDING  Incomplete  Blood Culture (routine x 2)     Status: Abnormal   Collection Time: 11/01/24  6:30 PM   Specimen: BLOOD LEFT FOREARM  Result Value Ref Range Status   Specimen Description BLOOD LEFT FOREARM  Final   Special Requests   Final    BOTTLES DRAWN AEROBIC AND ANAEROBIC Blood Culture results may not be optimal due to an  inadequate volume of blood received in culture bottles   Culture  Setup Time   Final    GRAM POSITIVE COCCI IN CLUSTERS AEROBIC BOTTLE ONLY CRITICAL RESULT CALLED TO, READ BACK BY AND VERIFIED WITH: PHARMD EMILY S [id number] FCP    Culture (A)  Final    STAPHYLOCOCCUS HAEMOLYTICUS THE SIGNIFICANCE OF ISOLATING THIS ORGANISM FROM A SINGLE SET OF BLOOD CULTURES WHEN MULTIPLE SETS ARE DRAWN IS UNCERTAIN. PLEASE NOTIFY THE MICROBIOLOGY DEPARTMENT WITHIN ONE WEEK IF SPECIATION AND SENSITIVITIES ARE REQUIRED. Performed at [facility], [address] [place], [place] [address]    Report Status 11/03/2024 FINAL  Final  Blood Culture ID Panel (Reflexed)     Status: Abnormal   Collection Time: 11/01/24  6:30 PM  Result Value Ref Range Status   Enterococcus faecalis NOT DETECTED NOT DETECTED Final   Enterococcus Faecium NOT DETECTED NOT DETECTED Final   Listeria monocytogenes NOT DETECTED NOT DETECTED Final   Staphylococcus species DETECTED (A) NOT DETECTED Final    Comment: CRITICAL RESULT CALLED TO, READ BACK BY AND VERIFIED WITH: PHARMD EMILY S [phone] FCP    Staphylococcus aureus (BCID) NOT DETECTED NOT DETECTED Final   Staphylococcus epidermidis NOT DETECTED NOT DETECTED Final   Staphylococcus lugdunensis NOT DETECTED NOT DETECTED Final   Streptococcus species NOT DETECTED NOT DETECTED Final   Streptococcus agalactiae NOT DETECTED NOT DETECTED Final   Streptococcus pneumoniae NOT DETECTED NOT DETECTED Final   Streptococcus pyogenes NOT DETECTED NOT DETECTED Final   A.calcoaceticus-baumannii NOT DETECTED NOT DETECTED Final   Bacteroides fragilis NOT DETECTED NOT DETECTED Final   Enterobacterales NOT DETECTED NOT DETECTED Final   Enterobacter cloacae complex NOT DETECTED NOT DETECTED Final   Escherichia coli NOT DETECTED NOT DETECTED Final   Klebsiella aerogenes NOT DETECTED NOT DETECTED Final   Klebsiella oxytoca NOT DETECTED NOT DETECTED Final   Klebsiella pneumoniae NOT  DETECTED NOT DETECTED Final   Proteus species NOT DETECTED NOT DETECTED Final   Salmonella species NOT DETECTED NOT DETECTED Final   Serratia marcescens NOT DETECTED NOT DETECTED Final   Haemophilus influenzae NOT DETECTED NOT DETECTED Final   Neisseria meningitidis NOT DETECTED NOT DETECTED Final   Pseudomonas aeruginosa NOT DETECTED NOT DETECTED Final   Stenotrophomonas maltophilia NOT DETECTED NOT DETECTED Final   Candida albicans NOT DETECTED NOT DETECTED Final   Candida auris NOT DETECTED NOT DETECTED Final   Candida glabrata NOT DETECTED NOT DETECTED Final   Candida krusei NOT DETECTED NOT DETECTED Final   Candida parapsilosis NOT DETECTED NOT DETECTED Final   Candida tropicalis NOT DETECTED NOT DETECTED Final   Cryptococcus neoformans/gattii NOT DETECTED NOT DETECTED Final    Comment: Performed at [facility], [address] [place], [place] [address]         Radiology Studies: DG Chest Port 1 View Result Date: 11/05/2024 EXAM: 1 VIEW(S) XRAY OF THE CHEST 11/05/2024 09:19:46 AM COMPARISON: 4 days ago. CLINICAL HISTORY: Dyspnea, pleural effusion. FINDINGS: LUNGS AND PLEURA: Stable left basilar opacity concerning for atelectasis or infiltrate with associated pleural effusion. No pulmonary edema. No pneumothorax. HEART AND MEDIASTINUM: Stable cardiomediastinal silhouette. BONES AND SOFT TISSUES: No acute osseous abnormality. IMPRESSION: 1. Stable left basilar opacity, suspicious for atelectasis or infection, with associated pleural effusion. Electronically signed by: Lynwood Seip MD 11/05/2024 10:23 AM EST RP Workstation: [id number]           LOS: 4 days   Time spent= 35 mins    Burgess JAYSON Dare, MD [facility]  If 7PM-7AM, please contact night-coverage  11/05/2024, 12:13 PM

## 2024-11-06 ENCOUNTER — Other Ambulatory Visit (HOSPITAL_COMMUNITY): Payer: Self-pay

## 2024-11-06 DIAGNOSIS — N170 Acute kidney failure with tubular necrosis: Secondary | ICD-10-CM | POA: Diagnosis not present

## 2024-11-06 LAB — GLUCOSE, CAPILLARY
Glucose-Capillary: 168 mg/dL — ABNORMAL HIGH (ref 70–99)
Glucose-Capillary: 341 mg/dL — ABNORMAL HIGH (ref 70–99)
Glucose-Capillary: 41 mg/dL — CL (ref 70–99)
Glucose-Capillary: 423 mg/dL — ABNORMAL HIGH (ref 70–99)
Glucose-Capillary: 57 mg/dL — ABNORMAL LOW (ref 70–99)
Glucose-Capillary: 61 mg/dL — ABNORMAL LOW (ref 70–99)
Glucose-Capillary: 91 mg/dL (ref 70–99)

## 2024-11-06 LAB — CULTURE, BLOOD (ROUTINE X 2): Culture: NO GROWTH

## 2024-11-06 LAB — CBC
HCT: 35.6 % — ABNORMAL LOW (ref 39.0–52.0)
Hemoglobin: 11.5 g/dL — ABNORMAL LOW (ref 13.0–17.0)
MCH: 24.9 pg — ABNORMAL LOW (ref 26.0–34.0)
MCHC: 32.3 g/dL (ref 30.0–36.0)
MCV: 77.2 fL — ABNORMAL LOW (ref 80.0–100.0)
Platelets: 559 K/uL — ABNORMAL HIGH (ref 150–400)
RBC: 4.61 MIL/uL (ref 4.22–5.81)
RDW: 17 % — ABNORMAL HIGH (ref 11.5–15.5)
WBC: 11.5 K/uL — ABNORMAL HIGH (ref 4.0–10.5)
nRBC: 0 % (ref 0.0–0.2)

## 2024-11-06 LAB — BASIC METABOLIC PANEL WITH GFR
Anion gap: 14 (ref 5–15)
BUN: 38 mg/dL — ABNORMAL HIGH (ref 8–23)
CO2: 26 mmol/L (ref 22–32)
Calcium: 9.1 mg/dL (ref 8.9–10.3)
Chloride: 96 mmol/L — ABNORMAL LOW (ref 98–111)
Creatinine, Ser: 1.5 mg/dL — ABNORMAL HIGH (ref 0.61–1.24)
GFR, Estimated: 46 mL/min — ABNORMAL LOW (ref >60)
Glucose, Bld: 67 mg/dL — ABNORMAL LOW (ref 70–99)
Potassium: 3.9 mmol/L (ref 3.5–5.1)
Sodium: 136 mmol/L (ref 135–145)

## 2024-11-06 LAB — MAGNESIUM: Magnesium: 1.8 mg/dL (ref 1.7–2.4)

## 2024-11-06 MED ORDER — CYANOCOBALAMIN 1000 MCG PO TABS
1000.0000 ug | ORAL_TABLET | Freq: Every day | ORAL | 0 refills | Status: AC
  Filled 2024-11-06: qty 30, 30d supply, fill #0

## 2024-11-06 MED ORDER — NOVOLOG FLEXPEN 100 UNIT/ML ~~LOC~~ SOPN
0.0000 [IU] | PEN_INJECTOR | Freq: Three times a day (TID) | SUBCUTANEOUS | 11 refills | Status: DC
  Filled 2024-11-06: qty 6, 24d supply, fill #0

## 2024-11-06 MED ORDER — FUROSEMIDE 40 MG PO TABS
40.0000 mg | ORAL_TABLET | Freq: Every day | ORAL | 0 refills | Status: DC
  Filled 2024-11-06: qty 30, 30d supply, fill #0

## 2024-11-06 MED ORDER — POTASSIUM CHLORIDE CRYS ER 20 MEQ PO TBCR
20.0000 meq | EXTENDED_RELEASE_TABLET | Freq: Every day | ORAL | 0 refills | Status: AC
  Filled 2024-11-06: qty 30, 30d supply, fill #0

## 2024-11-06 MED ORDER — FERROUS SULFATE 325 (65 FE) MG PO TABS
325.0000 mg | ORAL_TABLET | Freq: Every day | ORAL | 0 refills | Status: AC
  Filled 2024-11-06: qty 30, 30d supply, fill #0

## 2024-11-06 NOTE — Progress Notes (Signed)
 Chart reviewed. No new recommendations.  We will follow remotely. Please call with questions.

## 2024-11-06 NOTE — TOC Initial Note (Signed)
 Transition of Care [facility]) - Initial/Assessment Note    Patient Details  Name: Andre Stout MRN: [id number] Date of Birth: 01-Nov-1940  Transition of Care [facility]) CM/SW Contact:    Andre Barnie Rama, RN Phone Number: 11/06/2024, 10:55 AM  Clinical Narrative:                 From home with daughter, has PCP Andre Stout 336 [phone]  and insurance on file, states has [facility] services in place with [facility] and would like to continue. NCM confirmed with Andre Stout with [facility].  Has walker/ shower chair and bsc at home.  States family member  (grandson) will transport them home at [employer] and family is support system, states gets [facility] [address]  Pta self ambulatory.     Expected Discharge Plan: Home w Home Stout Services Barriers to Discharge: No Barriers Identified   Patient Goals and CMS Choice Patient states their goals for this hospitalization and ongoing recovery are:: return home CMS Medicare.gov Compare Post Acute Care list provided to:: Patient Represenative (must comment) Choice offered to / list presented to : Adult Children      Expected Discharge Plan and Services In-house Referral: NA Discharge Planning Services: CM Consult Post Acute Care Choice: Home Stout Living arrangements for the past 2 months: Single Family Home Expected Discharge Date: 11/06/24               DME Arranged: N/A DME Agency: NA       HH Arranged: PT, OT HH Agency: Andre Stout Date HH Agency Contacted: 11/06/24 Time HH Agency Contacted: 1054 Representative spoke with at [facility] Agency: Andre Stout  Prior Living Arrangements/Services Living arrangements for the past 2 months: Single Family Home Lives with:: Relatives, Adult Children Patient language and need for interpreter reviewed:: Yes Do you feel safe going back to the place where you live?: Yes      Need for Family Participation in Patient Care: Yes (Comment) Care giver support system in place?: Yes  (comment) Current home services: DME, Home PT (has walker, shower chair and bsc, active with [facility] for HHPT) Criminal Activity/Legal Involvement Pertinent to Current Situation/Hospitalization: No - Comment as needed  Activities of Daily Living   ADL Screening (condition at time of admission) Is the patient deaf or have difficulty hearing?: No Does the patient have difficulty seeing, even when wearing glasses/contacts?: No Does the patient have difficulty concentrating, remembering, or making decisions?: Yes  Permission Sought/Granted Permission sought to share information with : Case Manager, Family Supports Permission granted to share information with : Yes, Verbal Permission Granted  Share Information with NAME: Andre Stout  Permission granted to share info w AGENCY: HH  Permission granted to share info w Relationship: daughter     Emotional Assessment Appearance:: Appears stated age Attitude/Demeanor/Rapport: Engaged Affect (typically observed): Appropriate Orientation: : Oriented to Self, Oriented to Place, Oriented to  Time, Oriented to Situation   Psych Involvement: No (comment)  Admission diagnosis:  CHF (congestive heart failure) (HCC) [I50.9] ARF (acute renal failure) [N17.9] General weakness [R53.1] Elevated troponin [R79.89] Bilateral pleural effusion [J90] AKI (acute kidney injury) [N17.9] Elevated lactic acid level [R79.89] Patient Active Problem List   Diagnosis Date Noted   Non-ST elevation (NSTEMI) myocardial infarction (HCC) 11/02/2024   CHF (congestive heart failure) (HCC) 11/02/2024   ARF (acute renal failure) 11/01/2024   Elevated troponin 11/01/2024   Diarrhea 11/01/2024   Uncontrolled type 2 diabetes mellitus with hyperglycemia (HCC) 11/01/2024  Pleural effusion 11/01/2024   Pneumonia 09/02/2024   Nausea and vomiting 09/02/2024   Uncontrolled diabetes mellitus with hypoglycemia (HCC) 09/02/2024   Acute metabolic encephalopathy 09/02/2024    Hyperkalemia 09/02/2024   Microcytic anemia 09/02/2024   Moderate malnutrition 07/25/2024   L1 vertebral fracture (HCC) 07/22/2024   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   Severe sepsis (HCC) 07/21/2024   Aspiration pneumonia (HCC) 07/21/2024   Acute respiratory failure with hypoxia (HCC) 07/21/2024   CKD (chronic kidney disease) stage 3, GFR 30-59 ml/min (HCC) 07/21/2024   Subdural hematoma (HCC) 07/21/2024   Irritant contact dermatitis associated with stoma 03/13/2024   Persistent postoperative fistula 03/13/2024   GERD without esophagitis 03/05/2024   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   History of complete heart block 03/05/2024   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   Malnutrition of moderate degree 01/20/2024   Hyperlipidemia 01/17/2024   Leukocytosis 01/17/2024   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 (HCC) 01/17/2024   Acute encephalopathy 01/17/2024   fracture of distal diaphyseal metaphyseal junction of the 5th left metacarpal with dorsal angulation 01/17/2024   Cholecystostomy care (HCC) 01/17/2024   Migration of percutaneous cholecystostomy tube 08/18/2023   Hypertension associated with diabetes (HCC) 08/18/2023   History of stroke 08/18/2023   Mixed diabetic hyperlipidemia associated with type 2 diabetes mellitus (HCC) 08/18/2023   TIA (transient ischemic attack) 10/15/2022   Odynophagia 08/21/2022   Hyponatremia 08/20/2022   Obesity (BMI 30-39.9) 08/20/2022   Myocardial injury 08/20/2022   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   Seizure (HCC) 01/02/2022   Prolonged QT interval 01/02/2022   Syncope and collapse 10/09/2020   Prostate cancer (HCC) 03/25/2019   Hypophosphatemia 03/06/2016   Low magnesium  level 03/06/2016   Benign essential HTN 03/05/2016   AKI (acute kidney injury) 03/04/2016   PCP:  Andre Leita Norris, DO Pharmacy:   [facility] [address] [place]  [address] Phone: [phone] Fax: [phone]  [id number] [place], [place] - [address] AT [facility] [address] [place] [address] Phone: [phone] Fax: [phone]  [id number] [place], [place] - [address] AT [facility] [address] [place] [address] Phone: [phone] Fax: [phone]  [id number] [place], [place] - [address] [facility] [address] AT [facility] [address] [place] [address] Phone: [phone] Fax: [phone]     Social Drivers of Stout (SDOH) Social History: SDOH Screenings   Food Insecurity: No Food Insecurity (11/03/2024)  Housing: Low Risk  (11/03/2024)  Transportation Needs: Patient Unable To Answer (11/03/2024)  Utilities: Patient Unable To Answer (11/03/2024)  Alcohol Screen: Low Risk  (07/24/2023)  Depression (PHQ2-9): Low Risk  (07/24/2023)  Financial Resource Strain: Low Risk  (10/26/2024)  Physical Activity: Inactive (10/26/2024)  Social Connections: Unknown (11/03/2024)  Recent Concern: Social Connections - Socially Isolated (10/26/2024)  Stress: No Stress Concern Present (10/26/2024)  Tobacco Use: Medium Risk (11/01/2024)  Stout Literacy: Adequate Stout Literacy (07/24/2023)   SDOH Interventions:     Readmission Risk Interventions    11/06/2024   10:49 AM 08/15/2022    9:25 AM  Readmission Risk Prevention Plan  Transportation Screening Complete Complete  PCP or Specialist Appt within 5-7 Days  Complete  Home Care Screening  Complete  Medication Review (RN CM)  Complete  Medication Review (RN Care Manager) Complete   PCP or Specialist appointment within  3-5 days of discharge Complete   HRI or Home Care Consult Complete   Palliative Care Screening Not Applicable   Skilled Nursing Facility Not Applicable

## 2024-11-06 NOTE — Progress Notes (Signed)
 Date and time results received: 11/06/24 0830 (use smartphrase .now to insert current time)  Test: CBG Critical Value: 25  Name of Provider Notified: Caleen  Orders Received? Or Actions Taken? Sprite and juice were given to patient, pt ate breakfast and CBG was retaken CBG 168.

## 2024-11-06 NOTE — Discharge Summary (Signed)
 Physician Discharge Summary  Andre Stout FMW:[id number] DOB: Jun 18, 1940 DOA: 11/01/2024  PCP: Georgina Leita Norris, DO  Admit date: 11/01/2024 Discharge date: 11/06/2024  Admitted From: Home Disposition: Home  Recommendations for Outpatient Follow-up:  Follow up with PCP in 1-2 weeks Please obtain BMP/CBC in one week your next doctors visit.  Needs to closely follow-up with PCP due to labile blood glucose Daily Lasix along with potassium supplements added. Would benefit from outpatient follow-up with cardiology in next 2-3 weeks  Discharge Condition: Stable CODE STATUS: DNR Diet recommendation: Diabetic  Brief/Interim Summary: Brief Narrative:  84 year old with history of DM2, HTN, chronic anemia, HLD, complete heart block, prostate cancer comes to the ED for poor p.o. intake and diarrhea.  Recently admitted in September for aspiration pneumonia.  Workup in the ER revealed large bilateral pleural effusion concerning for CHF exacerbation, AKI, hyponatremia, elevated BNP, troponin and lactic acidosis. Echocardiogram showed newly depressed EF of 35%.  Cardiology discussed with the patient and family and no further invasive procedures were planned.  He tolerated diuretics well during the hospitalization and did not have any signs of hypoxia or shortness of breath.  Today patient is medically stable.  Will add daily Lasix with potassium supplements with outpatient follow-up.  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  Does have signs of significant bilateral pleural effusion with some compressive atelectasis but fortunately he is comfortable on room air without any severe respiratory distress.  Seems to be tolerating diuretics well.  Symptoms are stable - I think we can hold off on thoracentesis given his hemodynamic  stability  Acute kidney injury -Baseline creatinine 1.1, admission creatinine 2.29> 1.65 > 1.5  Multifocal pneumonia - Completed 5 days of Rocephin /doxycycline .  Continue bronchodilators as needed  Diarrhea/poor intake - Resolved  Vitamin B12 deficiency - Supplements  GPC bacteremia - Suspect contaminant   Hyponatremia, resolved -In the setting of volume overload.  Admission sodium 127 now slowly improving  History of intermittent complete heart block -Followed by cardiology  Anemia of chronic disease with microcytosis - Low iron saturation.  P.o. 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  IV as needed     DVT prophylaxis: SQ Hep    Code Status: Limited: Do not attempt resuscitation (DNR) -DNR-LIMITED -Do Not Intubate/DNI  Family Communication: Daughter updated.  Discharge today   PT Follow up Recs: Home Health Pt11/03/2024 1103; F2F complete  Subjective: Low blood glucose overnight as he had required significant amount of insulin  yesterday evening due to severe hyperglycemia.  Stable this morning.  Tolerating p.o.  No other complaints at this time Daughter updated No hypoxia with ambulation or shortness of breath  Examination:  General exam: Appears calm and comfortable  Respiratory system: Minimal basilar crackles Cardiovascular system: S1 & S2 heard, RRR. No JVD, murmurs, rubs, gallops or clicks. No pedal edema. Gastrointestinal system: Abdomen is nondistended, soft and nontender. No organomegaly or masses felt. Normal bowel sounds heard. Central nervous system: Alert and oriented. No focal neurological deficits. Extremities: Symmetric 5 x 5 power. Skin: No rashes, lesions or ulcers Psychiatry: Judgement and insight appear poor    Discharge Diagnoses:  Principal Problem:   ARF (acute renal  failure) Active Problems:   Hyponatremia   Microcytic anemia   Prostate cancer (HCC)  Acute respiratory failure with hypoxia (HCC)   Moderate malnutrition   Elevated troponin   Diarrhea   Uncontrolled type 2 diabetes mellitus with hyperglycemia (HCC)   Pleural effusion   Non-ST elevation (NSTEMI) myocardial infarction [facility])   CHF (congestive heart failure) (HCC)      Discharge Exam: Vitals:   11/06/24 0730 11/06/24 1054  BP:  (!) 108/49  Pulse:  62  Resp: 16 17  Temp: (!) 96 F (35.6 C) 98 F (36.7 C)  SpO2:  94%   Vitals:   11/06/24 0200 11/06/24 0318 11/06/24 0730 11/06/24 1054  BP:  (!) 115/55  (!) 108/49  Pulse:  63  62  Resp: 14 16 16 17   Temp:  (!) 96.5 F (35.8 C) (!) 96 F (35.6 C) 98 F (36.7 C)  TempSrc:  Axillary Rectal Oral  SpO2:  95%  94%  Weight:      Height:          Discharge Instructions   Allergies as of 11/06/2024   No Known Allergies      Medication List     STOP taking these medications    NovoLOG  Mix 70/30 FlexPen (70-30) 100 UNIT/ML FlexPen Generic drug: insulin  aspart protamine - aspart       TAKE these medications    acetaminophen  500 MG tablet Commonly known as: TYLENOL  Take 1,000 mg by mouth every 6 (six) hours as needed for mild pain (pain score 1-3) or moderate pain (pain score 4-6).   amLODipine  10 MG tablet Commonly known as: NORVASC  Take 1 tablet (10 mg total) by mouth daily.   atorvastatin  40 MG tablet Commonly known as: LIPITOR Take 1 tablet (40 mg total) by mouth daily.   Azelastine  HCl 137 MCG/SPRAY Soln PLACE 1 SPRAY INTO BOTH NOSTRILS 2 (TWO) TIMES DAILY AS NEEDED. NEEDS APPT What changed:  reasons to take this additional instructions   cyanocobalamin 1000 MCG tablet Take 1 tablet (1,000 mcg total) by mouth daily. Start taking on: November 07, 2024   ferrous sulfate 325 (65 FE) MG tablet Take 1 tablet (325 mg total) by mouth daily with breakfast.   FreeStyle Libre 3 Plus Sensor Misc 1  each by Does not apply route every 14 (fourteen) days.   furosemide 40 MG tablet Commonly known as: Lasix Take 1 tablet (40 mg total) by mouth daily.   Insulin  Pen Needle 32G X 4 MM Misc Use to inject insulin  4 times a day   Insulin  Syringe-Needle U-100 31G X 5/16 1 ML Misc Commonly known as: BD Insulin  Syringe U/F USE 2 DAILY   Lantus  SoloStar 100 UNIT/ML Solostar Pen Generic drug: insulin  glargine Inject 15 Units into the skin daily.   magnesium  oxide 400 (240 Mg) MG tablet Commonly known as: MAG-OX TAKE 1 TABLET BY MOUTH TWICE A DAY What changed: when to take this   metFORMIN  500 MG tablet Commonly known as: GLUCOPHAGE  Take 1 tablet (500 mg total) by mouth 2 (two) times daily with a meal.   NovoLOG  FlexPen 100 UNIT/ML FlexPen Generic drug: insulin  aspart Inject 0-6 Units into the skin 4 (four) times daily as directed with sliding scale -  before meals and bedtime snack. What changed:  how much to take when to take this   omeprazole  40 MG capsule Commonly known as: PRILOSEC Take 1 capsule (40 mg total) by mouth in the morning.   OneTouch Delica Lancets 33G Misc Use to check blood sugar 4 times per day. Dx code: E11.9  OneTouch Ultra test strip Generic drug: glucose blood USE TO MONITOR GLUCOSE LEVELS 4 TIMES PER DAY E11.9   potassium chloride  SA 20 MEQ tablet Commonly known as: KLOR-CON  M Take 1 tablet (20 mEq total) by mouth daily.   PreserVision AREDS 2 Caps Take 1 capsule by mouth every evening.        Follow-up Information     Monge, Damien BROCKS, NP Follow up.   Specialties: Cardiology, Family Medicine Why: Andre Stout - cardiology follow-up on Friday Nov 20, 2024 at 2:45 PM (Arrive by 2:25 PM). Damien is one of the nurse practitioners that works with Dr. Lorren Pass information: [address] [place] [address] [phone]         [facility]) Follow up.   Specialty: Home Health Services Why: Agency  will call you to set up apt times Contact information: [address] [place]  [address] [phone]        [facility] Harlene CROME, NP Follow up on 11/11/2024.   Specialty: Nurse Practitioner Why: 2:40 for hospital follow up, please arrive 15 mins early, copay due at time of services, if need to cancel please call 24 hrs ahead of time Contact information: [address] [place] [address] [phone]                No Known Allergies  You were cared for by a hospitalist during your hospital stay. If you have any questions about your discharge medications or the care you received while you were in the hospital after you are discharged, you can call the unit and asked to speak with the hospitalist on call if the hospitalist that took care of you is not available. Once you are discharged, your primary care physician will handle any further medical issues. Please note that no refills for any discharge medications will be authorized once you are discharged, as it is imperative that you return to your primary care physician (or establish a relationship with a primary care physician if you do not have one) for your aftercare needs so that they can reassess your need for medications and monitor your lab values.  You were cared for by a hospitalist during your hospital stay. If you have any questions about your discharge medications or the care you received while you were in the hospital after you are discharged, you can call the unit and asked to speak with the hospitalist on call if the hospitalist that took care of you is not available. Once you are discharged, your primary care physician will handle any further medical issues. Please note that NO REFILLS for any discharge medications will be authorized once you are discharged, as it is imperative that you return to your primary care physician (or establish a relationship with a primary care physician if you do  not have one) for your aftercare needs so that they can reassess your need for medications and monitor your lab values.  Please request your Prim.MD to go over all Hospital Tests and Procedure/Radiological results at the follow up, please get all Hospital records sent to your Prim MD by signing hospital release before you go home.  Get CBC, CMP, 2 view Chest X ray checked  by Primary MD during your next visit or SNF MD in 5-7 days ( we routinely change or add medications that can affect your baseline labs and fluid status, therefore we recommend that you get the mentioned basic workup  next visit with your PCP, your PCP may decide not to get them or add new tests based on their clinical decision)  On your next visit with your primary care physician please Get Medicines reviewed and adjusted.  If you experience worsening of your admission symptoms, develop shortness of breath, life threatening emergency, suicidal or homicidal thoughts you must seek medical attention immediately by calling 911 or calling your MD immediately  if symptoms less severe.  You Must read complete instructions/literature along with all the possible adverse reactions/side effects for all the Medicines you take and that have been prescribed to you. Take any new Medicines after you have completely understood and accpet all the possible adverse reactions/side effects.   Do not drive, operate heavy machinery, perform activities at heights, swimming or participation in water activities or provide baby sitting services if your were admitted for syncope or siezures until you have seen by Primary MD or a Neurologist and advised to do so again.  Do not drive when taking Pain medications.   Procedures/Studies: DG Chest Port 1 View Result Date: 11/05/2024 EXAM: 1 VIEW(S) XRAY OF THE CHEST 11/05/2024 09:19:46 AM COMPARISON: 4 days ago. CLINICAL HISTORY: Dyspnea, pleural effusion. FINDINGS: LUNGS AND PLEURA: Stable left basilar opacity  concerning for atelectasis or infiltrate with associated pleural effusion. No pulmonary edema. No pneumothorax. HEART AND MEDIASTINUM: Stable cardiomediastinal silhouette. BONES AND SOFT TISSUES: No acute osseous abnormality. IMPRESSION: 1. Stable left basilar opacity, suspicious for atelectasis or infection, with associated pleural effusion. Electronically signed by: Lynwood Seip MD 11/05/2024 10:23 AM EST RP Workstation: [id number]   ECHOCARDIOGRAM COMPLETE Result Date: 11/02/2024    ECHOCARDIOGRAM REPORT   Patient Name:   Andre Stout Date of Exam: 11/02/2024 Medical Rec #:  [id number]      Height:       60.0 in Accession #:    [id number]     Weight:       158.7 lb Date of Birth:  [birth year]     BSA:          1.692 m Patient Age:    21 years       BP:           90/45 mmHg Patient Gender: M              HR:           81 bpm. Exam Location:  Inpatient Procedure: 2D Echo and Intracardiac Opacification Agent (Both Spectral and Color            Flow Doppler were utilized during procedure). Indications:    Elevated Troponin  History:        Patient has prior history of Echocardiogram examinations.  Sonographer:    Charmaine Gaskins Referring Phys: 2 ARSHAD N KAKRAKANDY IMPRESSIONS  1. Left ventricular ejection fraction, by estimation, is 35 to 40%. The left ventricle has moderately decreased function. The left ventricle demonstrates regional wall motion abnormalities (see scoring diagram/findings for description). Left ventricular  diastolic function could not be evaluated.  2. Right ventricular systolic function is mildly reduced. The right ventricular size is normal.  3. Left atrial size was severely dilated.  4. Right atrial size was mildly dilated.  5. The mitral valve is normal in structure. Mild mitral valve regurgitation. No evidence of mitral stenosis.  6. The aortic valve is normal in structure. Aortic valve regurgitation is not visualized. No aortic stenosis is present.  7. The inferior vena cava is  dilated in size with <  50% respiratory variability, suggesting right atrial pressure of 15 mmHg. Comparison(s): Compared to prior echo, EF is reduced and there are new wall motion abnormalities. FINDINGS  Left Ventricle: Left ventricular ejection fraction, by estimation, is 35 to 40%. The left ventricle has moderately decreased function. The left ventricle demonstrates regional wall motion abnormalities. Definity  contrast agent was given IV to delineate the left ventricular endocardial borders. The left ventricular internal cavity size was normal in size. There is no left ventricular hypertrophy. Left ventricular diastolic function could not be evaluated due to nondiagnostic images. Left ventricular diastolic function could not be evaluated.  LV Wall Scoring: The mid and distal anterior wall, entire anterior septum, mid inferoseptal segment, and apex are hypokinetic. The entire lateral wall, entire inferior wall, basal anterior segment, and basal inferoseptal segment are normal. Right Ventricle: The right ventricular size is normal. No increase in right ventricular wall thickness. Right ventricular systolic function is mildly reduced. Left Atrium: Left atrial size was severely dilated. Right Atrium: Right atrial size was mildly dilated. Pericardium: There is no evidence of pericardial effusion. Mitral Valve: The mitral valve is normal in structure. Mild mitral valve regurgitation. No evidence of mitral valve stenosis. Tricuspid Valve: The tricuspid valve is normal in structure. Tricuspid valve regurgitation is not demonstrated. No evidence of tricuspid stenosis. Aortic Valve: The aortic valve is normal in structure. Aortic valve regurgitation is not visualized. No aortic stenosis is present. Pulmonic Valve: The pulmonic valve was normal in structure. Pulmonic valve regurgitation is not visualized. No evidence of pulmonic stenosis. Aorta: The aortic root is normal in size and structure. Venous: The inferior vena cava  is dilated in size with less than 50% respiratory variability, suggesting right atrial pressure of 15 mmHg. IAS/Shunts: No atrial level shunt detected by color flow Doppler.  LEFT VENTRICLE PLAX 2D LVIDd:         4.00 cm      Diastology LVIDs:         2.80 cm      LV e' medial:    7.83 cm/s LV PW:         0.90 cm      LV E/e' medial:  12.5 LV IVS:        0.90 cm      LV e' lateral:   9.14 cm/s LVOT diam:     2.30 cm      LV E/e' lateral: 10.7 LVOT Area:     4.15 cm  LV Volumes (MOD) LV vol d, MOD A2C: 123.0 ml LV vol d, MOD A4C: 97.0 ml LV vol s, MOD A2C: 66.8 ml LV vol s, MOD A4C: 66.1 ml LV SV MOD A2C:     56.2 ml LV SV MOD A4C:     97.0 ml LV SV MOD BP:      41.5 ml RIGHT VENTRICLE RV Basal diam:  3.40 cm RV Mid diam:    3.70 cm RV S prime:     9.36 cm/s TAPSE (M-mode): 1.0 cm LEFT ATRIUM              Index        RIGHT ATRIUM           Index LA diam:        4.00 cm  2.36 cm/m   RA Area:     19.20 cm LA Vol (A2C):   102.0 ml 60.28 ml/m  RA Volume:   53.90 ml  31.86 ml/m LA Vol (A4C):   83.3  ml  49.23 ml/m LA Biplane Vol: 95.0 ml  56.15 ml/m   AORTA Ao Root diam: 3.40 cm Ao Asc diam:  2.90 cm MITRAL VALVE MV Area (PHT): 4.18 cm    SHUNTS MV Decel Time: 182 msec    Systemic Diam: 2.30 cm MV E velocity: 97.60 cm/s MV A velocity: 30.40 cm/s MV E/A ratio:  3.21 Franck Azobou Tonleu Electronically signed by Joelle Cedars Tonleu Signature Date/Time: 11/02/2024/2:15:07 PM    Final    CT CHEST ABDOMEN PELVIS WO CONTRAST Result Date: 11/01/2024 EXAM: CT CHEST, ABDOMEN AND PELVIS WITHOUT CONTRAST 11/01/2024 08:09:41 PM TECHNIQUE: CT of the chest, abdomen and pelvis was performed without the administration of intravenous contrast. Multiplanar reformatted images are provided for review. Automated exposure control, iterative reconstruction, and/or weight based adjustment of the mA/kV was utilized to reduce the radiation dose to as low as reasonably achievable. COMPARISON: 09/02/2024 CLINICAL HISTORY: Sepsis.  FINDINGS: CHEST: MEDIASTINUM AND LYMPH NODES: Mild cardiomegaly. Coronary artery and aortic atherosclerosis. The central airways are clear. No mediastinal, hilar or axillary lymphadenopathy. LUNGS AND PLEURA: Moderate to large bilateral pleural effusions. Compressive atelectasis in the lower lobes bilaterally. Scattered ground glass opacities throughout the lungs may reflect early edema. No pneumothorax. ABDOMEN AND PELVIS: LIVER: The liver is unremarkable. GALLBLADDER AND BILE DUCTS: Small layering gallstones within the gallbladder. No biliary ductal dilatation. SPLEEN: No acute abnormality. PANCREAS: No acute abnormality. ADRENAL GLANDS: No acute abnormality. KIDNEYS, URETERS AND BLADDER: Stable 11 cm cyst in the mid to lower pole of the right kidney. No stones in the kidneys or ureters. No hydronephrosis. No perinephric or periureteral stranding. Urinary bladder is unremarkable. GI AND BOWEL: Stomach demonstrates no acute abnormality. There is no bowel obstruction. REPRODUCTIVE ORGANS: Radiation seeds in the region of the prostate. PERITONEUM AND RETROPERITONEUM: No ascites. No free air. VASCULATURE: Aorta is normal in caliber. Aortic atherosclerosis. ABDOMINAL AND PELVIS LYMPH NODES: No lymphadenopathy. BONES AND SOFT TISSUES: Severe chronic compression fracture at L1, unchanged. No focal soft tissue abnormality. IMPRESSION: 1. Moderate to large bilateral pleural effusions with compressive atelectasis in the lower lobes bilaterally. 2. Scattered ground-glass opacities throughout the lungs, possibly reflecting early edema. 3. Mild cardiomegaly. 4. Cholelithiasis. 5. No acute findings in the abdomen or pelvis. Electronically signed by: Franky Crease MD 11/01/2024 08:18 PM EST RP Workstation: [id number]   CT Head Wo Contrast Result Date: 11/01/2024 EXAM: CT HEAD WITHOUT CONTRAST 11/01/2024 08:09:41 PM TECHNIQUE: CT of the head was performed without the administration of intravenous contrast. Automated exposure  control, iterative reconstruction, and/or weight based adjustment of the mA/kV was utilized to reduce the radiation dose to as low as reasonably achievable. COMPARISON: CT 09/02/2024 CLINICAL HISTORY: Mental status change, unknown cause. FINDINGS: BRAIN AND VENTRICLES: No acute hemorrhage. No evidence of acute infarct. No hydrocephalus. No extra-axial collection. No mass effect or midline shift. Diffuse cerebral and cerebellar atrophy. Patchy and confluent areas of decreased attenuation are noted throughout the deep and periventricular white matter of the cerebral hemispheres bilaterally, suggestive of chronic microvascular ischemic changes. Atherosclerotic calcifications are present within the cavernous internal carotid and vertebral arteries. ORBITS: No acute abnormality. Bilateral lens replacement. SINUSES: No acute abnormality. SOFT TISSUES AND SKULL: Trace right posterior scalp hematoma (7.36). No skull fracture. IMPRESSION: 1. No acute intracranial abnormality. Electronically signed by: Morgane Naveau MD 11/01/2024 08:16 PM EST RP Workstation: [id number]   DG Chest Port 1 View Result Date: 11/01/2024 EXAM: 1 VIEW(S) XRAY OF THE CHEST 11/01/2024 06:40:00 PM COMPARISON: 09/02/2024 CLINICAL HISTORY: Questionable sepsis -  evaluate for abnormality. FINDINGS: LUNGS AND PLEURA: Consolidation in the left lower lobe concerning for pneumonia. Possible small left effusion. No pulmonary edema. No pneumothorax. HEART AND MEDIASTINUM: Heart is borderline in size. Aortic atherosclerosis. BONES AND SOFT TISSUES: No acute osseous abnormality. IMPRESSION: 1. Left lower lobe consolidation concerning for pneumonia. 2. Possible small left pleural effusion. Electronically signed by: Franky Crease MD 11/01/2024 06:50 PM EST RP Workstation: [id number]     The results of significant diagnostics from this hospitalization (including imaging, microbiology, ancillary and laboratory) are listed below for reference.      Microbiology: Recent Results (from the past 240 hours)  Resp panel by RT-PCR (RSV, Flu A&B, Covid) Anterior Nasal Swab     Status: None   Collection Time: 11/01/24  5:50 PM   Specimen: Anterior Nasal Swab  Result Value Ref Range Status   SARS Coronavirus 2 by RT PCR NEGATIVE NEGATIVE Final   Influenza A by PCR NEGATIVE NEGATIVE Final   Influenza B by PCR NEGATIVE NEGATIVE Final    Comment: (NOTE) The Xpert Xpress SARS-CoV-2/FLU/RSV plus assay is intended as an aid in the diagnosis of influenza from Nasopharyngeal swab specimens and should not be used as a sole basis for treatment. Nasal washings and aspirates are unacceptable for Xpert Xpress SARS-CoV-2/FLU/RSV testing.  Fact Sheet for Patients: bloggercourse.com  Fact Sheet for Healthcare Providers: seriousbroker.it  This test is not yet approved or cleared by the [place]  FDA and has been authorized for detection and/or diagnosis of SARS-CoV-2 by FDA under an Emergency Use Authorization (EUA). This EUA will remain in effect (meaning this test can be used) for the duration of the COVID-19 declaration under Section 564(b)(1) of the Act, 21 U.S.C. section 360bbb-3(b)(1), unless the authorization is terminated or revoked.     Resp Syncytial Virus by PCR NEGATIVE NEGATIVE Final    Comment: (NOTE) Fact Sheet for Patients: bloggercourse.com  Fact Sheet for Healthcare Providers: seriousbroker.it  This test is not yet approved or cleared by the [place]  FDA and has been authorized for detection and/or diagnosis of SARS-CoV-2 by FDA under an Emergency Use Authorization (EUA). This EUA will remain in effect (meaning this test can be used) for the duration of the COVID-19 declaration under Section 564(b)(1) of the Act, 21 U.S.C. section 360bbb-3(b)(1), unless the authorization is terminated or revoked.  Performed at  [facility], [address], [place], [place] [address]   Blood Culture (routine x 2)     Status: None   Collection Time: 11/01/24  5:55 PM   Specimen: BLOOD  Result Value Ref Range Status   Specimen Description BLOOD LEFT ANTECUBITAL  Final   Special Requests   Final    BOTTLES DRAWN AEROBIC AND ANAEROBIC Blood Culture results may not be optimal due to an inadequate volume of blood received in culture bottles   Culture   Final    NO GROWTH 5 DAYS Performed at [facility], [address], [place], [place] [address]    Report Status 11/06/2024 FINAL  Final  Blood Culture (routine x 2)     Status: Abnormal   Collection Time: 11/01/24  6:30 PM   Specimen: BLOOD LEFT FOREARM  Result Value Ref Range Status   Specimen Description BLOOD LEFT FOREARM  Final   Special Requests   Final    BOTTLES DRAWN AEROBIC AND ANAEROBIC Blood Culture results may not be optimal due to an inadequate volume of blood received in culture bottles   Culture  Setup Time   Final    GRAM POSITIVE COCCI IN CLUSTERS AEROBIC BOTTLE ONLY CRITICAL RESULT CALLED TO, READ BACK BY AND VERIFIED WITH: PHARMD EMILY S [phone] FCP    Culture (A)  Final    STAPHYLOCOCCUS HAEMOLYTICUS THE SIGNIFICANCE OF ISOLATING THIS ORGANISM FROM A SINGLE SET OF BLOOD CULTURES WHEN MULTIPLE SETS ARE DRAWN IS UNCERTAIN. PLEASE NOTIFY THE MICROBIOLOGY DEPARTMENT WITHIN ONE WEEK IF SPECIATION AND SENSITIVITIES ARE REQUIRED. Performed at [facility], [address], [place], [place] [address]    Report Status 11/03/2024 FINAL  Final  Blood Culture ID Panel (Reflexed)     Status: Abnormal   Collection Time: 11/01/24  6:30 PM  Result Value Ref Range Status   Enterococcus faecalis NOT DETECTED NOT DETECTED Final   Enterococcus Faecium NOT DETECTED NOT DETECTED Final   Listeria monocytogenes NOT DETECTED NOT DETECTED Final   Staphylococcus species DETECTED (A) NOT DETECTED Final    Comment: CRITICAL RESULT CALLED TO,  READ BACK BY AND VERIFIED WITH: PHARMD EMILY S [phone] FCP    Staphylococcus aureus (BCID) NOT DETECTED NOT DETECTED Final   Staphylococcus epidermidis NOT DETECTED NOT DETECTED Final   Staphylococcus lugdunensis NOT DETECTED NOT DETECTED Final   Streptococcus species NOT DETECTED NOT DETECTED Final   Streptococcus agalactiae NOT DETECTED NOT DETECTED Final   Streptococcus pneumoniae NOT DETECTED NOT DETECTED Final   Streptococcus pyogenes NOT DETECTED NOT DETECTED Final   A.calcoaceticus-baumannii NOT DETECTED NOT DETECTED Final   Bacteroides fragilis NOT DETECTED NOT DETECTED Final   Enterobacterales NOT DETECTED NOT DETECTED Final   Enterobacter cloacae complex NOT DETECTED NOT DETECTED Final   Escherichia coli NOT DETECTED NOT DETECTED Final   Klebsiella aerogenes NOT DETECTED NOT DETECTED Final   Klebsiella oxytoca NOT DETECTED NOT DETECTED Final   Klebsiella pneumoniae NOT DETECTED NOT DETECTED Final   Proteus species NOT DETECTED NOT DETECTED Final   Salmonella species NOT DETECTED NOT DETECTED Final   Serratia marcescens NOT DETECTED NOT DETECTED Final   Haemophilus influenzae NOT DETECTED NOT DETECTED Final   Neisseria meningitidis NOT DETECTED NOT DETECTED Final   Pseudomonas aeruginosa NOT DETECTED NOT DETECTED Final   Stenotrophomonas maltophilia NOT DETECTED NOT DETECTED Final   Candida albicans NOT DETECTED NOT DETECTED Final   Candida auris NOT DETECTED NOT DETECTED Final   Candida glabrata NOT DETECTED NOT DETECTED Final   Candida krusei NOT DETECTED NOT DETECTED Final   Candida parapsilosis NOT DETECTED NOT DETECTED Final   Candida tropicalis NOT DETECTED NOT DETECTED Final   Cryptococcus neoformans/gattii NOT DETECTED NOT DETECTED Final    Comment: Performed at [facility], [address] [place], [place] [address]     Labs: BNP (last 3 results) Recent Labs    09/02/24 0257 11/01/24 1830  BNP 1,092.1* 2,276.0*   Basic Metabolic Panel: Recent  Labs  Lab 11/01/24 2136 11/02/24 0416 11/03/24 0657 11/03/24 0809 11/04/24 0640 11/05/24 0255 11/05/24 1915 11/06/24 0022  NA 128*   < > 134* 133* 136 137 135 136  K 4.8   < > 4.4 4.4 4.3 3.9 4.7 3.9  CL 96*   < > 97* 98 97* 98 93* 96*  CO2 19*   < > 22 20* 26 26 27 26   GLUCOSE 288*   < > 260* 217* 118* 159* 365* 67*  BUN 48*   < > 41* 42* 33* 31* 35* 38*  CREATININE 2.26*   < > 1.94* 1.88* 1.65* 1.69* 1.75*  1.50*  CALCIUM  8.2*   < > 8.9 8.8* 9.1 8.7* 9.2 9.1  MG 2.2  --  2.0  --  1.8 1.5*  --  1.8  PHOS  --   --  3.9  --   --   --   --   --    < > = values in this interval not displayed.   Liver Function Tests: Recent Labs  Lab 11/01/24 1830 11/02/24 0416  AST 15 13*  ALT 12 10  ALKPHOS 78 67  BILITOT 0.9 0.4  PROT 6.9 6.0*  ALBUMIN  2.9* 2.5*   No results for input(s): LIPASE, AMYLASE in the last 168 hours. Recent Labs  Lab 11/02/24 0845  AMMONIA <13   CBC: Recent Labs  Lab 11/01/24 1830 11/01/24 2136 11/02/24 0416 11/02/24 1318 11/03/24 0330 11/06/24 0022  WBC 8.0 7.7 6.9 6.8 6.6 11.5*  NEUTROABS 6.2  --  4.2  --   --   --   HGB 10.7* 9.8* 9.0* 9.3* 9.6* 11.5*  HCT 34.3* 31.2* 28.6* 28.7* 29.9* 35.6*  MCV 80.1 79.6* 79.2* 78.8* 79.1* 77.2*  PLT 397 367 380 326 368 559*   Cardiac Enzymes: Recent Labs  Lab 11/01/24 2136  CKTOTAL 37*   BNP: Invalid input(s): POCBNP CBG: Recent Labs  Lab 11/06/24 0200 11/06/24 0344 11/06/24 0727 11/06/24 0812 11/06/24 1052  GLUCAP 57* 91 41* 168* 341*   D-Dimer No results for input(s): DDIMER in the last 72 hours. Hgb A1c No results for input(s): HGBA1C in the last 72 hours. Lipid Profile No results for input(s): CHOL, HDL, LDLCALC, TRIG, CHOLHDL, LDLDIRECT in the last 72 hours. Thyroid  function studies No results for input(s): TSH, T4TOTAL, T3FREE, THYROIDAB in the last 72 hours.  Invalid input(s): FREET3 Anemia work up No results for input(s): VITAMINB12, FOLATE,  FERRITIN, TIBC, IRON, RETICCTPCT in the last 72 hours. Urinalysis    Component Value Date/Time   COLORURINE YELLOW 11/01/2024 1854   APPEARANCEUR CLEAR 11/01/2024 1854   LABSPEC 1.015 11/01/2024 1854   PHURINE 5.0 11/01/2024 1854   GLUCOSEU 150 (A) 11/01/2024 1854   HGBUR NEGATIVE 11/01/2024 1854   BILIRUBINUR NEGATIVE 11/01/2024 1854   KETONESUR NEGATIVE 11/01/2024 1854   PROTEINUR 30 (A) 11/01/2024 1854   UROBILINOGEN 1.0 10/04/2009 1109   NITRITE NEGATIVE 11/01/2024 1854   LEUKOCYTESUR TRACE (A) 11/01/2024 1854   Sepsis Labs Recent Labs  Lab 11/02/24 0416 11/02/24 1318 11/03/24 0330 11/06/24 0022  WBC 6.9 6.8 6.6 11.5*   Microbiology Recent Results (from the past 240 hours)  Resp panel by RT-PCR (RSV, Flu A&B, Covid) Anterior Nasal Swab     Status: None   Collection Time: 11/01/24  5:50 PM   Specimen: Anterior Nasal Swab  Result Value Ref Range Status   SARS Coronavirus 2 by RT PCR NEGATIVE NEGATIVE Final   Influenza A by PCR NEGATIVE NEGATIVE Final   Influenza B by PCR NEGATIVE NEGATIVE Final    Comment: (NOTE) The Xpert Xpress SARS-CoV-2/FLU/RSV plus assay is intended as an aid in the diagnosis of influenza from Nasopharyngeal swab specimens and should not be used as a sole basis for treatment. Nasal washings and aspirates are unacceptable for Xpert Xpress SARS-CoV-2/FLU/RSV testing.  Fact Sheet for Patients: bloggercourse.com  Fact Sheet for Healthcare Providers: seriousbroker.it  This test is not yet approved or cleared by the [place]  FDA and has been authorized for detection and/or diagnosis of SARS-CoV-2 by FDA under an Emergency Use Authorization (EUA). This EUA will remain in effect (  meaning this test can be used) for the duration of the COVID-19 declaration under Section 564(b)(1) of the Act, 21 U.S.C. section 360bbb-3(b)(1), unless the authorization is terminated or revoked.     Resp  Syncytial Virus by PCR NEGATIVE NEGATIVE Final    Comment: (NOTE) Fact Sheet for Patients: bloggercourse.com  Fact Sheet for Healthcare Providers: seriousbroker.it  This test is not yet approved or cleared by the [place]  FDA and has been authorized for detection and/or diagnosis of SARS-CoV-2 by FDA under an Emergency Use Authorization (EUA). This EUA will remain in effect (meaning this test can be used) for the duration of the COVID-19 declaration under Section 564(b)(1) of the Act, 21 U.S.C. section 360bbb-3(b)(1), unless the authorization is terminated or revoked.  Performed at [facility], [address] [place], [place] [address]   Blood Culture (routine x 2)     Status: None   Collection Time: 11/01/24  5:55 PM   Specimen: BLOOD  Result Value Ref Range Status   Specimen Description BLOOD LEFT ANTECUBITAL  Final   Special Requests   Final    BOTTLES DRAWN AEROBIC AND ANAEROBIC Blood Culture results may not be optimal due to an inadequate volume of blood received in culture bottles   Culture   Final    NO GROWTH 5 DAYS Performed at [facility], [address] [place], [place] [address]    Report Status 11/06/2024 FINAL  Final  Blood Culture (routine x 2)     Status: Abnormal   Collection Time: 11/01/24  6:30 PM   Specimen: BLOOD LEFT FOREARM  Result Value Ref Range Status   Specimen Description BLOOD LEFT FOREARM  Final   Special Requests   Final    BOTTLES DRAWN AEROBIC AND ANAEROBIC Blood Culture results may not be optimal due to an inadequate volume of blood received in culture bottles   Culture  Setup Time   Final    GRAM POSITIVE COCCI IN CLUSTERS AEROBIC BOTTLE ONLY CRITICAL RESULT CALLED TO, READ BACK BY AND VERIFIED WITH: PHARMD EMILY S [phone] FCP    Culture (A)  Final    STAPHYLOCOCCUS HAEMOLYTICUS THE SIGNIFICANCE OF ISOLATING THIS ORGANISM FROM A SINGLE SET OF BLOOD CULTURES WHEN  MULTIPLE SETS ARE DRAWN IS UNCERTAIN. PLEASE NOTIFY THE MICROBIOLOGY DEPARTMENT WITHIN ONE WEEK IF SPECIATION AND SENSITIVITIES ARE REQUIRED. Performed at [facility], [address], [place], [place] [address]    Report Status 11/03/2024 FINAL  Final  Blood Culture ID Panel (Reflexed)     Status: Abnormal   Collection Time: 11/01/24  6:30 PM  Result Value Ref Range Status   Enterococcus faecalis NOT DETECTED NOT DETECTED Final   Enterococcus Faecium NOT DETECTED NOT DETECTED Final   Listeria monocytogenes NOT DETECTED NOT DETECTED Final   Staphylococcus species DETECTED (A) NOT DETECTED Final    Comment: CRITICAL RESULT CALLED TO, READ BACK BY AND VERIFIED WITH: PHARMD EMILY S [phone] FCP    Staphylococcus aureus (BCID) NOT DETECTED NOT DETECTED Final   Staphylococcus epidermidis NOT DETECTED NOT DETECTED Final   Staphylococcus lugdunensis NOT DETECTED NOT DETECTED Final   Streptococcus species NOT DETECTED NOT DETECTED Final   Streptococcus agalactiae NOT DETECTED NOT DETECTED Final   Streptococcus pneumoniae NOT DETECTED NOT DETECTED Final   Streptococcus pyogenes NOT DETECTED NOT DETECTED Final   A.calcoaceticus-baumannii NOT DETECTED NOT DETECTED Final   Bacteroides fragilis NOT DETECTED NOT DETECTED Final   Enterobacterales NOT DETECTED NOT DETECTED  Final   Enterobacter cloacae complex NOT DETECTED NOT DETECTED Final   Escherichia coli NOT DETECTED NOT DETECTED Final   Klebsiella aerogenes NOT DETECTED NOT DETECTED Final   Klebsiella oxytoca NOT DETECTED NOT DETECTED Final   Klebsiella pneumoniae NOT DETECTED NOT DETECTED Final   Proteus species NOT DETECTED NOT DETECTED Final   Salmonella species NOT DETECTED NOT DETECTED Final   Serratia marcescens NOT DETECTED NOT DETECTED Final   Haemophilus influenzae NOT DETECTED NOT DETECTED Final   Neisseria meningitidis NOT DETECTED NOT DETECTED Final   Pseudomonas aeruginosa NOT DETECTED NOT DETECTED Final    Stenotrophomonas maltophilia NOT DETECTED NOT DETECTED Final   Candida albicans NOT DETECTED NOT DETECTED Final   Candida auris NOT DETECTED NOT DETECTED Final   Candida glabrata NOT DETECTED NOT DETECTED Final   Candida krusei NOT DETECTED NOT DETECTED Final   Candida parapsilosis NOT DETECTED NOT DETECTED Final   Candida tropicalis NOT DETECTED NOT DETECTED Final   Cryptococcus neoformans/gattii NOT DETECTED NOT DETECTED Final    Comment: Performed at [facility], [address], [place], [place] [address]     Time coordinating discharge:  I have spent 35 minutes face to face with the patient and on the ward discussing the patients care, assessment, plan and disposition with other care givers. >50% of the time was devoted counseling the patient about the risks and benefits of treatment/Discharge disposition and coordinating care.   SIGNED:   Burgess JAYSON Dare, MD  [facility] 11/06/2024, 12:17 PM   If 7PM-7AM, please contact night-coverage

## 2024-11-06 NOTE — Plan of Care (Signed)
  Problem: Metabolic: Goal: Ability to maintain appropriate glucose levels will improve Outcome: Not Progressing   Problem: Nutrition: Goal: Adequate nutrition will be maintained Outcome: Not Progressing   Problem: Education: Goal: Ability to describe self-care measures that may prevent or decrease complications (Diabetes Survival Skills Education) will improve Outcome: Progressing Goal: Individualized Educational Video(s) Outcome: Progressing   Problem: Coping: Goal: Ability to adjust to condition or change in health will improve Outcome: Progressing   Problem: Fluid Volume: Goal: Ability to maintain a balanced intake and output will improve Outcome: Progressing   Problem: Health Behavior/Discharge Planning: Goal: Ability to identify and utilize available resources and services will improve Outcome: Progressing Goal: Ability to manage health-related needs will improve Outcome: Progressing   Problem: Nutritional: Goal: Maintenance of adequate nutrition will improve Outcome: Progressing Goal: Progress toward achieving an optimal weight will improve Outcome: Progressing   Problem: Skin Integrity: Goal: Risk for impaired skin integrity will decrease Outcome: Progressing   Problem: Tissue Perfusion: Goal: Adequacy of tissue perfusion will improve Outcome: Progressing   Problem: Education: Goal: Knowledge of General Education information will improve Description: Including pain rating scale, medication(s)/side effects and non-pharmacologic comfort measures Outcome: Progressing   Problem: Health Behavior/Discharge Planning: Goal: Ability to manage health-related needs will improve Outcome: Progressing   Problem: Clinical Measurements: Goal: Ability to maintain clinical measurements within normal limits will improve Outcome: Progressing Goal: Will remain free from infection Outcome: Progressing Goal: Diagnostic test results will improve Outcome: Progressing Goal:  Respiratory complications will improve Outcome: Progressing Goal: Cardiovascular complication will be avoided Outcome: Progressing   Problem: Activity: Goal: Risk for activity intolerance will decrease Outcome: Progressing   Problem: Coping: Goal: Level of anxiety will decrease Outcome: Progressing   Problem: Elimination: Goal: Will not experience complications related to bowel motility Outcome: Progressing Goal: Will not experience complications related to urinary retention Outcome: Progressing   Problem: Pain Managment: Goal: General experience of comfort will improve and/or be controlled Outcome: Progressing   Problem: Safety: Goal: Ability to remain free from injury will improve Outcome: Progressing   Problem: Skin Integrity: Goal: Risk for impaired skin integrity will decrease Outcome: Progressing

## 2024-11-06 NOTE — TOC Transition Note (Signed)
 Transition of Care [facility]) - Discharge Note   Patient Details  Name: Andre Stout MRN: [id number] Date of Birth: 05-10-1940  [facility]) CM/SW Contact:  [name], RN Phone Number: 11/06/2024, 10:59 AM   Clinical Narrative:    For dc today, he is active with [facility].  [facility] will transport him home.    Final next level of care: Home w Home Health Services Barriers to Discharge: No Barriers Identified   Patient Goals and CMS Choice Patient states their goals for this hospitalization and ongoing recovery are:: return home CMS Medicare.gov Compare Post Acute Care list provided to:: Patient Represenative (must comment) Choice offered to / list presented to : Adult Children      Discharge Placement                       Discharge Plan and Services Additional resources added to the After Visit Summary for   In-house Referral: NA Discharge Planning Services: CM Consult Post Acute Care Choice: Home Health          DME Arranged: N/A DME Agency: NA       HH Arranged: PT, OT HH Agency: [facility] Date HH Agency Contacted: 11/06/24 Time HH Agency Contacted: 1054 Representative spoke with at [facility] Agency: Arna  Social Drivers of Health (SDOH) Interventions SDOH Screenings   Food Insecurity: No Food Insecurity (11/03/2024)  Housing: Low Risk  (11/03/2024)  Transportation Needs: Patient Unable To Answer (11/03/2024)  Utilities: Patient Unable To Answer (11/03/2024)  Alcohol Screen: Low Risk  (07/24/2023)  Depression (PHQ2-9): Low Risk  (07/24/2023)  Financial Resource Strain: Low Risk  (10/26/2024)  Physical Activity: Inactive (10/26/2024)  Social Connections: Unknown (11/03/2024)  Recent Concern: Social Connections - Socially Isolated (10/26/2024)  Stress: No Stress Concern Present (10/26/2024)  Tobacco Use: Medium Risk (11/01/2024)  Health Literacy: Adequate Health Literacy (07/24/2023)     Readmission Risk Interventions     11/06/2024   10:49 AM 08/15/2022    9:25 AM  Readmission Risk Prevention Plan  Transportation Screening Complete Complete  PCP or Specialist Appt within 5-7 Days  Complete  Home Care Screening  Complete  Medication Review (RN CM)  Complete  Medication Review (RN Care Manager) Complete   PCP or Specialist appointment within 3-5 days of discharge Complete   HRI or Home Care Consult Complete   Palliative Care Screening Not Applicable   Skilled Nursing Facility Not Applicable

## 2024-11-06 NOTE — TOC CM/SW Note (Signed)
 Transition of Care (TOC) CM/SW Note    Patient was previously at [facility] from 7/31-10/14.  Venida Tsukamoto, MSW, LCSWA Transitions of Care [phone]

## 2024-11-06 NOTE — Progress Notes (Signed)
 Physical Therapy Treatment Patient Details Name: Andre Stout MRN: [id number] DOB: Feb 26, 1940 Today's Date: 11/06/2024   History of Present Illness 84 y.o. M adm 11/01/24 with AMS, diarrhea, bil pleural effusion, AKI, hyponatremia. PMH: AV block, HTN, T2DM, prostate CA, glaucoma, CHF, CVA, HLD    PT Comments  Pt pleasant, oriented to self and able to walk in hall. Pt remains limited by fatigue with hall ambulation but demonstrates improved stability without scissoring this session. Pt educated for transfers, HEP and gait with plan for 24hr assist and HHPT appropriate if family can provide assist vs continued inpatient follow up therapy, <3 hours/day if family cannot assist. No family present. Will continue to follow.      If plan is discharge home, recommend the following: A little help with walking and/or transfers;A little help with bathing/dressing/bathroom;Assistance with cooking/housework;Direct supervision/assist for financial management;Supervision due to cognitive status;Assist for transportation;Help with stairs or ramp for entrance   Can travel by private vehicle        Equipment Recommendations  None recommended by PT    Recommendations for Other Services       Precautions / Restrictions Precautions Precautions: Fall Recall of Precautions/Restrictions: Impaired     Mobility  Bed Mobility Overal bed mobility: Needs Assistance Bed Mobility: Supine to Sit     Supine to sit: Min assist, HOB elevated     General bed mobility comments: HOB 40 degrees with min assist to pivot to EOB and elevate trunk from surface    Transfers Overall transfer level: Needs assistance   Transfers: Sit to/from Stand Sit to Stand: Min assist, Contact guard assist           General transfer comment: min assist to rise from bed with cues for hand placement and safety. Performed 5 repeated sit to stands at recliner with use of armrest and mod cues to push off surface and reach for  rail- CGA    Ambulation/Gait Ambulation/Gait assistance: Min assist Gait Distance (Feet): 90 Feet Assistive device: Rolling walker (2 wheels) Gait Pattern/deviations: Step-through pattern, Decreased stride length, Narrow base of support, Trunk flexed   Gait velocity interpretation: <1.31 ft/sec, indicative of household ambulator   General Gait Details: pt with flexed trunk, narrow BOS and lmited by fatigue. Cues for posture, increased BOS, direction and proximity to [employer] Mobility     Tilt Bed    Modified Rankin (Stroke Patients Only)       Balance Overall balance assessment: Needs assistance Sitting-balance support: No upper extremity supported, Feet supported Sitting balance-Leahy Scale: Fair     Standing balance support: Bilateral upper extremity supported, During functional activity, Reliant on assistive device for balance Standing balance-Leahy Scale: Poor Standing balance comment: reliant on RW for support                            Communication Communication Communication: Impaired Factors Affecting Communication: Hearing impaired  Cognition Arousal: Alert Behavior During Therapy: WFL for tasks assessed/performed   PT - Cognitive impairments: No family/caregiver present to determine baseline, Orientation, Safety/Judgement, Problem solving, Memory   Orientation impairments: Time, Situation, Place                   PT - Cognition Comments: pt stating year as 18 and day as monday Following commands: Impaired Following commands impaired: Follows one step commands with increased time  Cueing Cueing Techniques: Verbal cues, Gestural cues  Exercises General Exercises - Lower Extremity Long Arc Quad: AROM, Both, 20 reps, Seated, Strengthening Hip Flexion/Marching: AROM, Strengthening, Seated, Both, 10 reps    General Comments        Pertinent Vitals/Pain Pain Assessment Pain Assessment: No/denies  pain    Home Living                          Prior Function            PT Goals (current goals can now be found in the care plan section) Progress towards PT goals: Progressing toward goals    Frequency    Min 2X/week      PT Plan      Co-evaluation              AM-PAC PT 6 Clicks Mobility   Outcome Measure  Help needed turning from your back to your side while in a flat bed without using bedrails?: A Little Help needed moving from lying on your back to sitting on the side of a flat bed without using bedrails?: A Little Help needed moving to and from a bed to a chair (including a wheelchair)?: A Little Help needed standing up from a chair using your arms (e.g., wheelchair or bedside chair)?: A Little Help needed to walk in hospital room?: A Little Help needed climbing 3-5 steps with a railing? : A Lot 6 Click Score: 17    End of Session Equipment Utilized During Treatment: Gait belt Activity Tolerance: Patient tolerated treatment well Patient left: in chair;with call bell/phone within reach;with chair alarm set Nurse Communication: Mobility status PT Visit Diagnosis: Other abnormalities of gait and mobility (R26.89);Difficulty in walking, not elsewhere classified (R26.2);Muscle weakness (generalized) (M62.81)     Time: 9045-8983 PT Time Calculation (min) (ACUTE ONLY): 22 min  Charges:    $Gait Training: 8-22 mins PT General Charges $$ ACUTE PT VISIT: 1 Visit                     Lenoard SQUIBB, PT Acute Rehabilitation Services Office: [phone]    Lenoard NOVAK Stryder Poitra 11/06/2024, 10:54 AM

## 2024-11-06 NOTE — Care Management Important Message (Signed)
 Important Message  Patient Details  Name: Andre Stout MRN: [id number] Date of Birth: 04/09/1940   Important Message Given:  Yes - Medicare IM     [name] 11/06/2024, 2:49 PM

## 2024-11-06 NOTE — Progress Notes (Signed)
 Occupational Therapy Treatment Patient Details Name: Andre Stout MRN: [id number] DOB: [birth year] Today's Date: 11/06/2024   History of present illness 84 y.o. M adm 11/01/24 with AMS, diarrhea, bil pleural effusion, AKI, hyponatremia. PMH: AV block, HTN, T2DM, prostate CA, glaucoma, CHF, CVA, HLD   OT comments  Pt making limited progress towards OT goals at this time. When OT entered the room he slid so far down in the recliner that OT had to max A slide him up prior to putting the foot of the recliner down. Pt max A for LB ADL from seated position. Pt mod a for sit<>stand with mod cues for sequencing and hand placement. Pt declining standing grooming tasks this session, insistent about returning to bed. Pt min A for step pivot to bed. Pt will need 24/7 supervision with special attention as assist for medicine management as well as ADL in addition to HHOT as Pt remains confused and with generalized weakness, decreased activity tolerance, decreased balance, and decreased safety awareness. OT will continue to follow acutely.       If plan is discharge home, recommend the following:  A lot of help with walking and/or transfers;A lot of help with bathing/dressing/bathroom;Assistance with cooking/housework;Assist for transportation;Help with stairs or ramp for entrance;Supervision due to cognitive status   Equipment Recommendations  BSC/3in1    Recommendations for Other Services      Precautions / Restrictions Precautions Precautions: Fall Recall of Precautions/Restrictions: Impaired Restrictions Weight Bearing Restrictions Per Provider Order: No       Mobility Bed Mobility Overal bed mobility: Needs Assistance Bed Mobility: Sit to Supine       Sit to supine: Used rails, Contact guard assist   General bed mobility comments: no physical assist    Transfers Overall transfer level: Needs assistance Equipment used: Rolling walker (2 wheels) Transfers: Sit to/from Stand Sit to  Stand: Mod assist     Step pivot transfers: Min assist, Mod assist     General transfer comment: mod assist to rise from recliner with cues for hand placement and safety. Pt initially with strong posterior lean, with min A able to step pivot to bed     Balance Overall balance assessment: Needs assistance Sitting-balance support: No upper extremity supported, Feet supported Sitting balance-Leahy Scale: Fair     Standing balance support: Bilateral upper extremity supported, During functional activity, Reliant on assistive device for balance Standing balance-Leahy Scale: Poor Standing balance comment: reliant on RW for support                           ADL either performed or assessed with clinical judgement   ADL Overall ADL's : Needs assistance/impaired                     Lower Body Dressing: Maximal assistance;Sitting/lateral leans Lower Body Dressing Details (indicate cue type and reason): adjust socks Toilet Transfer: Minimal assistance;Rolling walker (2 wheels);Moderate assistance Toilet Transfer Details (indicate cue type and reason): mod A for boost, min A for balance with RW Toileting- Clothing Manipulation and Hygiene: Maximal assistance;Sitting/lateral lean;Sit to/from stand       Functional mobility during ADLs: Minimal assistance;Rolling walker (2 wheels) General ADL Comments: decreased cognition, decreased activity tolerance    Extremity/Trunk Assessment              Vision       Perception     Praxis     Communication Communication Communication: Impaired  Factors Affecting Communication: Hearing impaired   Cognition Arousal: Alert Behavior During Therapy: WFL for tasks assessed/performed Cognition: Cognition impaired   Orientation impairments: Time, Situation, Place Awareness: Intellectual awareness impaired, Online awareness impaired Memory impairment (select all impairments): Short-term memory, Working memory Attention  impairment (select first level of impairment): Sustained attention Executive functioning impairment (select all impairments): Reasoning, Problem solving OT - Cognition Comments: Pt with no safety awareness, pleasant and cooperative                 Following commands: Impaired Following commands impaired: Follows one step commands with increased time      Cueing   Cueing Techniques: Verbal cues, Gestural cues  Exercises      Shoulder Instructions       General Comments      Pertinent Vitals/ Pain       Pain Assessment Pain Assessment: No/denies pain  Home Living                                          Prior Functioning/Environment              Frequency  Min 2X/week        Progress Toward Goals  OT Goals(current goals can now be found in the care plan section)  Progress towards OT goals: Progressing toward goals  Acute Rehab OT Goals Patient Stated Goal: get back to sleep OT Goal Formulation: With patient Time For Goal Achievement: 11/16/24 Potential to Achieve Goals: Good ADL Goals Pt Will Perform Upper Body Dressing: with set-up Pt Will Perform Lower Body Dressing: with min assist;sit to/from stand Pt Will Transfer to Toilet: with contact guard assist;stand pivot transfer;bedside commode Pt Will Perform Toileting - Clothing Manipulation and hygiene: with contact guard assist;sit to/from stand  Plan      Co-evaluation                 AM-PAC OT 6 Clicks Daily Activity     Outcome Measure   Help from another person eating meals?: A Little Help from another person taking care of personal grooming?: A Little Help from another person toileting, which includes using toliet, bedpan, or urinal?: A Lot Help from another person bathing (including washing, rinsing, drying)?: A Lot Help from another person to put on and taking off regular upper body clothing?: A Little Help from another person to put on and taking off regular  lower body clothing?: A Lot 6 Click Score: 15    End of Session Equipment Utilized During Treatment: Gait belt;Rolling walker (2 wheels)  OT Visit Diagnosis: Unsteadiness on feet (R26.81);Other abnormalities of gait and mobility (R26.89);Muscle weakness (generalized) (M62.81);Other symptoms and signs involving cognitive function   Activity Tolerance Patient tolerated treatment well   Patient Left in bed;with call bell/phone within reach;with bed alarm set;with nursing/sitter in room (4 rails up)   Nurse Communication Mobility status;Precautions        Time: 8961-8947 OT Time Calculation (min): 14 min  Charges: OT General Charges $OT Visit: 1 Visit OT Treatments $Therapeutic Activity: 8-22 mins  Leita DEL OTR/L Acute Rehabilitation Services Office: [phone]  Leita PARAS [facility] 11/06/2024, 11:31 AM

## 2024-11-09 ENCOUNTER — Encounter: Payer: Self-pay | Admitting: Internal Medicine

## 2024-11-09 ENCOUNTER — Telehealth: Payer: Self-pay

## 2024-11-09 NOTE — Transitions of Care (Post Inpatient/ED Visit) (Signed)
   11/09/2024  Name: ROSHAN ROBACK MRN: [id number] DOB: 12/07/1940  Today's TOC FU Call Status: Today's TOC FU Call Status:: Unsuccessful Call (1st Attempt) Unsuccessful Call (1st Attempt) Date: 11/09/24  Attempted to reach the patient regarding the most recent Inpatient/ED visit.  Follow Up Plan: Additional outreach attempts will be made to reach the patient to complete the Transitions of Care (Post Inpatient/ED visit) call.   Medford Balboa, BSN, RN [place]  VBCI - [facility] RN Care Manager [phone]

## 2024-11-10 ENCOUNTER — Other Ambulatory Visit: Payer: Self-pay

## 2024-11-10 ENCOUNTER — Telehealth: Payer: Self-pay

## 2024-11-10 ENCOUNTER — Other Ambulatory Visit: Payer: Self-pay | Admitting: Internal Medicine

## 2024-11-10 MED ORDER — NOVOLOG FLEXPEN 100 UNIT/ML ~~LOC~~ SOPN: PEN_INJECTOR | SUBCUTANEOUS | 3 refills | Status: AC

## 2024-11-10 NOTE — Progress Notes (Deleted)
   Acute Office Visit  Subjective:     Patient ID: Andre Stout, male    DOB: 09/19/1940, 84 y.o.   MRN: [id number]  No chief complaint on file.   HPI Patient is in today for hospital follow-up.  DOES he follow with cardiology??? Birthday soon  Nephrology??? --need referral??  oncology  Andre Stout is an 84 year old with history of DM2, HTN, chronic anemia, HLD, complete heart block, prostate cancer comes. Came to t ED for poor p.o. intake and diarrhea. Recently admitted in September for aspiration pneumonia.   Echocardiogram 10/2024 showed newly depressed EF of 35%   Pt was started on Lasix in ED, Multifocal pneumonia  Completed 5 days of Rocephin /doxycycline .  Recent hospitalization Admission date: 11/03/2024 Discharge date: 11/06/2024 CC: Diagnosis: CHF, AKI, multifocal pneumonia  CT Chest/Abdomen 11/01/2024 IMPRESSION: 1. Moderate to large bilateral pleural effusions with compressive atelectasis in the lower lobes bilaterally. 2. Scattered ground-glass opacities throughout the lungs, possibly reflecting early edema. 3. Mild cardiomegaly. 4. Cholelithiasis. 5. No acute findings in the abdomen or pelvis.   CXR IMPRESSION: 11/05/2024 1. Stable left basilar opacity, suspicious for atelectasis or infection, with associated pleural effusion.    Recommendations for Outpatient Follow-up:  Follow up with PCP in 1-2 weeks Please obtain BMP/CBC in one week your next doctors visit.  Needs to closely follow-up with PCP due to labile blood glucose Daily Lasix along with potassium supplements added. Would benefit from outpatient follow-up with cardiology in next 2-3 weeks  ROS  See HPI    Objective:    There were no vitals taken for this visit. {Vitals History (Optional):23777}  Physical Exam  No results found for any visits on 11/11/24.      Assessment & Plan:   Problem List Items Addressed This Visit   None   No orders of the defined types were  placed in this encounter.   No follow-ups on file.  Clovia Reine L Chardai Gangemi, NP

## 2024-11-10 NOTE — Assessment & Plan Note (Deleted)
 Hydrate and monitor

## 2024-11-10 NOTE — Transitions of Care (Post Inpatient/ED Visit) (Signed)
   11/10/2024  Name: Andre Stout MRN: [id number] DOB: [birth year]  Today's TOC FU Call Status: Today's TOC FU Call Status:: Unsuccessful Call (2nd Attempt) Unsuccessful Call (1st Attempt) Date: 11/09/24 Unsuccessful Call (2nd Attempt) Date: 11/10/24  Attempted to reach the patient regarding the most recent Inpatient/ED visit.  Follow Up Plan: Additional outreach attempts will be made to reach the patient to complete the Transitions of Care (Post Inpatient/ED visit) call.   Medford Balboa, BSN, RN [place]  VBCI - [employer] [facility] RN Care Manager [phone]

## 2024-11-10 NOTE — Assessment & Plan Note (Deleted)
 Poor control. Continue follow up with endocrinology. A1C goal <8 On Statin

## 2024-11-11 ENCOUNTER — Inpatient Hospital Stay: Admitting: Student

## 2024-11-11 ENCOUNTER — Telehealth: Payer: Self-pay

## 2024-11-11 NOTE — Transitions of Care (Post Inpatient/ED Visit) (Signed)
   11/11/2024  Name: Andre Stout MRN: [id number] DOB: July 30, 1940  Today's TOC FU Call Status: Today's TOC FU Call Status:: Unsuccessful Call (3rd Attempt) Unsuccessful Call (1st Attempt) Date: 11/09/24 Unsuccessful Call (2nd Attempt) Date: 11/10/24 Unsuccessful Call (3rd Attempt) Date: 11/11/24  Attempted to reach the patient regarding the most recent Inpatient/ED visit.  Follow Up Plan: No further outreach attempts will be made at this time. We have been unable to contact the patient.  Medford Balboa, BSN, RN [place]  VBCI - [employer] [facility] RN Care Manager [phone]

## 2024-11-17 ENCOUNTER — Inpatient Hospital Stay: Admitting: Student

## 2024-11-17 NOTE — Progress Notes (Deleted)
   Acute Office Visit  Subjective:     Patient ID: Andre Stout, male    DOB: 1940-07-01, 84 y.o.   MRN: [id number]  No chief complaint on file.   HPI Patient is in today for hospital follow-up.  DOES he follow with cardiology??? Birthday soon  Nephrology??? --need referral??  oncology  Andre Stout is an 84 year old with history of DM2, HTN, chronic anemia, HLD, complete heart block, prostate cancer comes. Came to t ED for poor p.o. intake and diarrhea. Recently admitted in September for aspiration pneumonia.   Echocardiogram 10/2024 showed newly depressed EF of 35%   Pt was started on Lasix in ED, Multifocal pneumonia  Completed 5 days of Rocephin /doxycycline .  Recent hospitalization Admission date: 11/03/2024 Discharge date: 11/06/2024 CC: Diagnosis: CHF, AKI, multifocal pneumonia  CT Chest/Abdomen 11/01/2024 IMPRESSION: 1. Moderate to large bilateral pleural effusions with compressive atelectasis in the lower lobes bilaterally. 2. Scattered ground-glass opacities throughout the lungs, possibly reflecting early edema. 3. Mild cardiomegaly. 4. Cholelithiasis. 5. No acute findings in the abdomen or pelvis.   CXR IMPRESSION: 11/05/2024 1. Stable left basilar opacity, suspicious for atelectasis or infection, with associated pleural effusion.    Recommendations for Outpatient Follow-up:  Follow up with PCP in 1-2 weeks Please obtain BMP/CBC in one week your next doctors visit.  Needs to closely follow-up with PCP due to labile blood glucose Daily Lasix along with potassium supplements added. Would benefit from outpatient follow-up with cardiology in next 2-3 weeks  ROS  See HPI    Objective:    There were no vitals taken for this visit. {Vitals History (Optional):23777}  Physical Exam  No results found for any visits on 11/17/24.      Assessment & Plan:   Problem List Items Addressed This Visit   None   No orders of the defined types were  placed in this encounter.   No follow-ups on file.  Oasis Goehring L Kessler Kopinski, NP

## 2024-11-20 ENCOUNTER — Ambulatory Visit: Attending: Nurse Practitioner | Admitting: Nurse Practitioner

## 2024-11-20 NOTE — Progress Notes (Deleted)
   Acute Office Visit  Subjective:     Patient ID: Andre Stout, male    DOB: April 29, 1940, 84 y.o.   MRN: [id number]  No chief complaint on file.   HPI Patient is in today for hospital follow-up. HOSPICE???  DOES he follow with cardiology??? Birthday soon  Nephrology??? --need referral??  oncology  Andre Stout is an 84 year old with history of DM2, HTN, chronic anemia, HLD, complete heart block, prostate cancer . Came to t ED for poor p.o. intake and diarrhea. Recently admitted in September for aspiration pneumonia.   Echocardiogram 10/2024 showed newly depressed EF of 35%   Pt was started on Lasix  in ED, Multifocal pneumonia  Completed 5 days of Rocephin /doxycycline .  Recent hospitalization Admission date: 11/03/2024 Discharge date: 11/06/2024 CC: Diagnosis: CHF, AKI, multifocal pneumonia  CT Chest/Abdomen 11/01/2024 IMPRESSION: 1. Moderate to large bilateral pleural effusions with compressive atelectasis in the lower lobes bilaterally. 2. Scattered ground-glass opacities throughout the lungs, possibly reflecting early edema. 3. Mild cardiomegaly. 4. Cholelithiasis. 5. No acute findings in the abdomen or pelvis.   CXR IMPRESSION: 11/05/2024 1. Stable left basilar opacity, suspicious for atelectasis or infection, with associated pleural effusion.    Recommendations for Outpatient Follow-up:  Follow up with PCP in 1-2 weeks Please obtain BMP/CBC in one week your next doctors visit.  Needs to closely follow-up with PCP due to labile blood glucose Daily Lasix  along with potassium supplements added. Would benefit from outpatient follow-up with cardiology in next 2-3 weeks  ROS  See HPI    Objective:    There were no vitals taken for this visit. {Vitals History (Optional):23777}  Physical Exam  No results found for any visits on 11/24/24.      Assessment & Plan:   Problem List Items Addressed This Visit   None   No orders of the defined types  were placed in this encounter.   No follow-ups on file.  Andre Stout L Andre Villamar, NP

## 2024-11-20 NOTE — Progress Notes (Deleted)
 Office Visit    Patient Name: Andre Stout Date of Encounter: 11/20/2024  Primary Care Provider:  No primary care provider on file. Primary Cardiologist:  None  Chief Complaint    84 year old male with a history of coronary artery calcification noted on CT, HFrEF, syncope, complete heart block, secondary AV block Mobitz type I, CVA, hypertension, hyperlipidemia, prostate cancer, CKD stage III, and type 2 diabetes who presents for hospital follow-up related to CAD and HFrEF.  Past Medical History    Past Medical History:  Diagnosis Date   CKD (chronic kidney disease) stage 3, GFR 30-59 ml/min (HCC)    Diabetes mellitus without complication (HCC)    Glaucoma    Heart block    complete heart block   History of CVA (cerebrovascular accident)    Hypertension    Prostate cancer (HCC)    Been 3-4 years ago   Past Surgical History:  Procedure Laterality Date   CATARACT EXTRACTION Bilateral    INSERTION PROSTATE RADIATION SEED     IR EXCHANGE BILIARY DRAIN  08/19/2023   IR EXCHANGE BILIARY DRAIN  12/19/2023   IR EXCHANGE BILIARY DRAIN  01/20/2024   IR PERC CHOLECYSTOSTOMY  08/17/2022   IR RADIOLOGIST EVAL & MGMT  09/28/2022   IR RADIOLOGIST EVAL & MGMT  10/10/2022    Allergies  No Known Allergies   Labs/Other Studies Reviewed    The following studies were reviewed today:  Cardiac Studies & Procedures   ______________________________________________________________________________________________     ECHOCARDIOGRAM  ECHOCARDIOGRAM COMPLETE 11/02/2024  Narrative ECHOCARDIOGRAM REPORT    Patient Name:   Andre Stout Date of Exam: 11/02/2024 Medical Rec #:  [id number]      Height:       60.0 in Accession #:    [id number]     Weight:       158.7 lb Date of Birth:  [birth year]     BSA:          1.692 m Patient Age:    83 years       BP:           90/45 mmHg Patient Gender: M              HR:           81 bpm. Exam Location:  Inpatient  Procedure: 2D Echo and  Intracardiac Opacification Agent (Both Spectral and Color Flow Doppler were utilized during procedure).  Indications:    Elevated Troponin  History:        Patient has prior history of Echocardiogram examinations.  Sonographer:    Charmaine Gaskins Referring Phys: [id number] ARSHAD N KAKRAKANDY  IMPRESSIONS   1. Left ventricular ejection fraction, by estimation, is 35 to 40%. The left ventricle has moderately decreased function. The left ventricle demonstrates regional wall motion abnormalities (see scoring diagram/findings for description). Left ventricular diastolic function could not be evaluated. 2. Right ventricular systolic function is mildly reduced. The right ventricular size is normal. 3. Left atrial size was severely dilated. 4. Right atrial size was mildly dilated. 5. The mitral valve is normal in structure. Mild mitral valve regurgitation. No evidence of mitral stenosis. 6. The aortic valve is normal in structure. Aortic valve regurgitation is not visualized. No aortic stenosis is present. 7. The inferior vena cava is dilated in size with <50% respiratory variability, suggesting right atrial pressure of 15 mmHg.  Comparison(s): Compared to prior echo, EF is reduced and there are new wall motion abnormalities.  FINDINGS  Left Ventricle: Left ventricular ejection fraction, by estimation, is 35 to 40%. The left ventricle has moderately decreased function. The left ventricle demonstrates regional wall motion abnormalities. Definity  contrast agent was given IV to delineate the left ventricular endocardial borders. The left ventricular internal cavity size was normal in size. There is no left ventricular hypertrophy. Left ventricular diastolic function could not be evaluated due to nondiagnostic images. Left ventricular diastolic function could not be evaluated.   LV Wall Scoring: The mid and distal anterior wall, entire anterior septum, mid inferoseptal segment, and apex are hypokinetic.  The entire lateral wall, entire inferior wall, basal anterior segment, and basal inferoseptal segment are normal.  Right Ventricle: The right ventricular size is normal. No increase in right ventricular wall thickness. Right ventricular systolic function is mildly reduced.  Left Atrium: Left atrial size was severely dilated.  Right Atrium: Right atrial size was mildly dilated.  Pericardium: There is no evidence of pericardial effusion.  Mitral Valve: The mitral valve is normal in structure. Mild mitral valve regurgitation. No evidence of mitral valve stenosis.  Tricuspid Valve: The tricuspid valve is normal in structure. Tricuspid valve regurgitation is not demonstrated. No evidence of tricuspid stenosis.  Aortic Valve: The aortic valve is normal in structure. Aortic valve regurgitation is not visualized. No aortic stenosis is present.  Pulmonic Valve: The pulmonic valve was normal in structure. Pulmonic valve regurgitation is not visualized. No evidence of pulmonic stenosis.  Aorta: The aortic root is normal in size and structure.  Venous: The inferior vena cava is dilated in size with less than 50% respiratory variability, suggesting right atrial pressure of 15 mmHg.  IAS/Shunts: No atrial level shunt detected by color flow Doppler.   LEFT VENTRICLE PLAX 2D LVIDd:         4.00 cm      Diastology LVIDs:         2.80 cm      LV e' medial:    7.83 cm/s LV PW:         0.90 cm      LV E/e' medial:  12.5 LV IVS:        0.90 cm      LV e' lateral:   9.14 cm/s LVOT diam:     2.30 cm      LV E/e' lateral: 10.7 LVOT Area:     4.15 cm  LV Volumes (MOD) LV vol d, MOD A2C: 123.0 ml LV vol d, MOD A4C: 97.0 ml LV vol s, MOD A2C: 66.8 ml LV vol s, MOD A4C: 66.1 ml LV SV MOD A2C:     56.2 ml LV SV MOD A4C:     97.0 ml LV SV MOD BP:      41.5 ml  RIGHT VENTRICLE RV Basal diam:  3.40 cm RV Mid diam:    3.70 cm RV S prime:     9.36 cm/s TAPSE (M-mode): 1.0 cm  LEFT ATRIUM               Index        RIGHT ATRIUM           Index LA diam:        4.00 cm  2.36 cm/m   RA Area:     19.20 cm LA Vol (A2C):   102.0 ml 60.28 ml/m  RA Volume:   53.90 ml  31.86 ml/m LA Vol (A4C):   83.3 ml  49.23 ml/m LA Biplane Vol: 95.0 ml  56.15  ml/m  AORTA Ao Root diam: 3.40 cm Ao Asc diam:  2.90 cm  MITRAL VALVE MV Area (PHT): 4.18 cm    SHUNTS MV Decel Time: 182 msec    Systemic Diam: 2.30 cm MV E velocity: 97.60 cm/s MV A velocity: 30.40 cm/s MV E/A ratio:  3.21  Franck Azobou Tonleu Electronically signed by Joelle Cedars Tonleu Signature Date/Time: 11/02/2024/2:15:07 PM    Final    MONITORS  LONG TERM MONITOR-LIVE TELEMETRY (3-14 DAYS) 12/13/2022  Narrative NSR with sinus brady and sinus tachycardia NSVT, brief and infrequent Periods of complete heart block with a junctional escape in the 55-60 range. Rare PVC's and PAC's  Gregg Taylor,MD  Patch Wear Time:  14 days and 0 hours (2023-10-17T14:18:18-0400 to 2023-10-31T14:18:18-0400)  Patient had a min HR of 35 bpm, max HR of 200 bpm, and avg HR of 65 bpm. Predominant underlying rhythm was Sinus Rhythm. First Degree AV Block was present. 1 run of Ventricular Tachycardia occurred lasting 7 beats with a max rate of 200 bpm (avg 170 bpm). Episode of Ventricular Tachycardia may be Supraventricular Tachycardia with possible aberrancy. Second Degree AV Block-Mobitz I (Wenckebach) was present. Wenckebach was detected within +/- 45 seconds of symptomatic patient event(s). Isolated SVEs were rare (<1.0%), SVE Couplets were rare (<1.0%), and SVE Triplets were rare (<1.0%). Isolated VEs were rare (<1.0%), VE Couplets were rare (<1.0%), and no VE Triplets were present. MD notification criteria for Complete Heart Block met- (DS).       ______________________________________________________________________________________________     Recent Labs: 11/01/2024: B Natriuretic Peptide 2,276.0 11/02/2024: ALT 10; TSH 4.170 11/06/2024:  BUN 38; Creatinine, Ser 1.50; Hemoglobin 11.5; Magnesium  1.8; Platelets 559; Potassium 3.9; Sodium 136  Recent Lipid Panel    Component Value Date/Time   CHOL 115 11/14/2023 1058   TRIG 110.0 11/14/2023 1058   HDL 48.10 11/14/2023 1058   CHOLHDL 2 11/14/2023 1058   VLDL 22.0 11/14/2023 1058   LDLCALC 45 11/14/2023 1058   LDLDIRECT 121.0 03/25/2019 1008    History of Present Illness    84 year old male with the above past medical history including coronary artery calcification noted on CT, HFrEF, syncope, complete heart block, secondary AV block Mobitz type I, CVA, hypertension, hyperlipidemia, prostate cancer, CKD stage III, and type 2 diabetes.   He was hospitalized in January 2023 following a syncopal episode.  EMS reported seizure-like activity.  CT of the head was negative for acute process. EKG showed A-V dissociation and cardiology was consulted.  Prior EKGs with noted prolonged PR interval, LAFB. Echocardiogram showed EF 55 to 60%, normal LV function, mild to moderate hypertrophy of basal septum, mild LVH, grade 1 DD, arctic valve sclerosis without evidence of stenosis, no significant change from prior echo.  EP was consulted. He was last seen in the office on 11/16/2022 by EP and was stable from a cardiac standpoint.  He denied recurrent syncope.  Cardiac monitor in 09/2022 revealed predominantly normal sinus rhythm, average heart rate 65 bpm, first-degree AV block, run run of VT lasting 7 beats, second-degree AV block Mobitz type I, rare PACs and PVCs.  He was hospitalized in 10/23 in the setting of acute CVA.  Echocardiogram in 01/2024 showed EF 65 to 70%, normal LV function, no RWMA, mild LVH, hyperdynamic RV systolic function, no significant valvular abnormalities.  He was hospitalized in 10/2024 in the setting of NSTEMI, acute systolic heart failure, lactic acidosis, AKI. Echocardiogram in 10/2024 showed EF 35 to 40%, moderately decreased LV function, RWMA, mildly reduced  RV systolic  function, mild mitral valve regurgitation.  CT of the chest abdomen pelvis showed mild cardiomegaly, coronary atherosclerosis, moderate to large bilateral pleural effusions.  Cardiology was consulted.  He was diuresed with IV Lasix .  Medical management was recommended for CAD, HFrEF, as he declined any invasive procedures.  He was treated for pneumonia.  He presents today for follow-up.  Since his hospitalization  1. HFrEF:  2. CAD:  3. H/o syncope/complete heart block/second-degree, Mobitz type I: Admitted to the ED in early January following syncopal episode.  EKG showed A-V dissociation.  Cardiology and subsequently EP were consulted. Outpatient monitor incomplete preliminary results, reviewed with Dr. Barbaraann, show 3228 episodes of AV block, possible third-degree though looking at this closely with irregular QRS intervals, appears to be mostly Wenkenbach. Min HR 40 bpm, max HR 118 bpm, average HR 73 bpm. Patient triggered events did not correlate with heart block. Will refer to EP for follow-up for final review of monitor results.  He remains asymptomatic, denies any recurrent syncope, presyncope, dizziness, palpitations.  Continue to hold beta-blocker.  4. History of CVA: 09/2022.  5. Hypertension: BP elevated slightly above goal in office today.  He will continue to monitor BP. If BP persistently elevated greater than 130/80 consider addition of anti-hypertensive medication.    6. Hyperlipidemia:   7.  CKD stage III:  8. Type 2 diabetes: A1c on 01/03/2022 was 8.2.  Following with endocrinology.   9. Disposition: Follow-up   Home Medications    Current Outpatient Medications  Medication Sig Dispense Refill   acetaminophen  (TYLENOL ) 500 MG tablet Take 1,000 mg by mouth every 6 (six) hours as needed for mild pain (pain score 1-3) or moderate pain (pain score 4-6).     amLODipine  (NORVASC ) 10 MG tablet Take 1 tablet (10 mg total) by mouth daily. 90 tablet 3   atorvastatin  (LIPITOR) 40 MG  tablet Take 1 tablet (40 mg total) by mouth daily. 90 tablet 1   Azelastine  HCl 137 MCG/SPRAY SOLN PLACE 1 SPRAY INTO BOTH NOSTRILS 2 (TWO) TIMES DAILY AS NEEDED. NEEDS APPT (Patient taking differently: Place 1 spray into both nostrils 2 (two) times daily as needed (allergies).) 30 mL 0   Continuous Glucose Sensor (FREESTYLE LIBRE 3 PLUS SENSOR) MISC 1 each by Does not apply route every 14 (fourteen) days. 6 each 3   cyanocobalamin  1000 MCG tablet Take 1 tablet (1,000 mcg total) by mouth daily. 30 tablet 0   ferrous sulfate  325 (65 FE) MG tablet Take 1 tablet (325 mg total) by mouth daily with breakfast. 30 tablet 0   furosemide  (LASIX ) 40 MG tablet Take 1 tablet (40 mg total) by mouth daily. 30 tablet 0   insulin  aspart (NOVOLOG  FLEXPEN) 100 UNIT/ML FlexPen Inject under skin  5-7 units 10-15 min before meals 3-4 times a day 30 mL 3   Insulin  Pen Needle 32G X 4 MM MISC Use to inject insulin  4 times a day 400 each 3   Insulin  Syringe-Needle U-100 (BD INSULIN  SYRINGE U/F) 31G X 5/16 1 ML MISC USE 2 DAILY 200 each 4   LANTUS  SOLOSTAR 100 UNIT/ML Solostar Pen Inject 15 Units into the skin daily.     magnesium  oxide (MAG-OX) 400 (240 Mg) MG tablet TAKE 1 TABLET BY MOUTH TWICE A DAY (Patient taking differently: Take 400 mg by mouth daily.) 60 tablet 11   metFORMIN  (GLUCOPHAGE ) 500 MG tablet Take 1 tablet (500 mg total) by mouth 2 (two) times daily with a meal. 180  tablet 2   Multiple Vitamins-Minerals (PRESERVISION AREDS 2) CAPS Take 1 capsule by mouth every evening.     omeprazole  (PRILOSEC) 40 MG capsule Take 1 capsule (40 mg total) by mouth in the morning. 90 capsule 2   ONETOUCH DELICA LANCETS 33G MISC Use to check blood sugar 4 times per day. Dx code: E11.9 200 each 2   ONETOUCH ULTRA test strip USE TO MONITOR GLUCOSE LEVELS 4 TIMES PER DAY E11.9 100 each 2   potassium chloride  SA (KLOR-CON  M) 20 MEQ tablet Take 1 tablet (20 mEq total) by mouth daily. 30 tablet 0   No current facility-administered  medications for this visit.     Review of Systems    ***.  All other systems reviewed and are otherwise negative except as noted above.    Physical Exam    VS:  There were no vitals taken for this visit. , BMI There is no height or weight on file to calculate BMI.     GEN: Well nourished, well developed, in no acute distress. HEENT: normal. Neck: Supple, no JVD, carotid bruits, or masses. Cardiac: RRR, no murmurs, rubs, or gallops. No clubbing, cyanosis, edema.  Radials/DP/PT 2+ and equal bilaterally.  Respiratory:  Respirations regular and unlabored, clear to auscultation bilaterally. GI: Soft, nontender, nondistended, BS + x 4. MS: no deformity or atrophy. Skin: warm and dry, no rash. Neuro:  Strength and sensation are intact. Psych: Normal affect.  Accessory Clinical Findings    ECG personally reviewed by me today -    - no acute changes.   Lab Results  Component Value Date   WBC 11.5 (H) 11/06/2024   HGB 11.5 (L) 11/06/2024   HCT 35.6 (L) 11/06/2024   MCV 77.2 (L) 11/06/2024   PLT 559 (H) 11/06/2024   Lab Results  Component Value Date   CREATININE 1.50 (H) 11/06/2024   BUN 38 (H) 11/06/2024   NA 136 11/06/2024   K 3.9 11/06/2024   CL 96 (L) 11/06/2024   CO2 26 11/06/2024   Lab Results  Component Value Date   ALT 10 11/02/2024   AST 13 (L) 11/02/2024   ALKPHOS 67 11/02/2024   BILITOT 0.4 11/02/2024   Lab Results  Component Value Date   CHOL 115 11/14/2023   HDL 48.10 11/14/2023   LDLCALC 45 11/14/2023   LDLDIRECT 121.0 03/25/2019   TRIG 110.0 11/14/2023   CHOLHDL 2 11/14/2023    Lab Results  Component Value Date   HGBA1C 10.7 (H) 07/21/2024    Assessment & Plan    1.  ***  No BP recorded.  {Refresh Note OR Click here to enter BP  :1}***   Damien JAYSON Braver, NP 11/20/2024, 10:01 AM

## 2024-11-21 ENCOUNTER — Emergency Department (HOSPITAL_COMMUNITY)

## 2024-11-21 ENCOUNTER — Inpatient Hospital Stay (HOSPITAL_COMMUNITY)
Admission: EM | Admit: 2024-11-21 | Discharge: 2024-12-08 | DRG: 871 | Disposition: A | Discharge status: Skilled Nursing Facility | Attending: Internal Medicine | Admitting: Internal Medicine

## 2024-11-21 ENCOUNTER — Encounter (HOSPITAL_COMMUNITY): Payer: Self-pay

## 2024-11-21 DIAGNOSIS — R4189 Other symptoms and signs involving cognitive functions and awareness: Secondary | ICD-10-CM | POA: Diagnosis not present

## 2024-11-21 DIAGNOSIS — G9341 Metabolic encephalopathy: Secondary | ICD-10-CM | POA: Diagnosis present

## 2024-11-21 DIAGNOSIS — J189 Pneumonia, unspecified organism: Secondary | ICD-10-CM | POA: Diagnosis not present

## 2024-11-21 DIAGNOSIS — A419 Sepsis, unspecified organism: Secondary | ICD-10-CM | POA: Diagnosis present

## 2024-11-21 DIAGNOSIS — N1832 Chronic kidney disease, stage 3b: Secondary | ICD-10-CM

## 2024-11-21 DIAGNOSIS — J690 Pneumonitis due to inhalation of food and vomit: Secondary | ICD-10-CM | POA: Diagnosis not present

## 2024-11-21 DIAGNOSIS — N1831 Chronic kidney disease, stage 3a: Secondary | ICD-10-CM | POA: Diagnosis present

## 2024-11-21 DIAGNOSIS — R1310 Dysphagia, unspecified: Secondary | ICD-10-CM | POA: Diagnosis present

## 2024-11-21 DIAGNOSIS — Z66 Do not resuscitate: Secondary | ICD-10-CM | POA: Diagnosis present

## 2024-11-21 DIAGNOSIS — N183 Chronic kidney disease, stage 3 unspecified: Secondary | ICD-10-CM | POA: Diagnosis present

## 2024-11-21 DIAGNOSIS — R1312 Dysphagia, oropharyngeal phase: Secondary | ICD-10-CM | POA: Diagnosis not present

## 2024-11-21 DIAGNOSIS — Z515 Encounter for palliative care: Secondary | ICD-10-CM | POA: Diagnosis not present

## 2024-11-21 DIAGNOSIS — E871 Hypo-osmolality and hyponatremia: Secondary | ICD-10-CM | POA: Diagnosis present

## 2024-11-21 DIAGNOSIS — F03C0 Unspecified dementia, severe, without behavioral disturbance, psychotic disturbance, mood disturbance, and anxiety: Secondary | ICD-10-CM | POA: Diagnosis not present

## 2024-11-21 DIAGNOSIS — I509 Heart failure, unspecified: Secondary | ICD-10-CM | POA: Diagnosis not present

## 2024-11-21 DIAGNOSIS — R4182 Altered mental status, unspecified: Principal | ICD-10-CM

## 2024-11-21 DIAGNOSIS — D509 Iron deficiency anemia, unspecified: Secondary | ICD-10-CM | POA: Diagnosis present

## 2024-11-21 DIAGNOSIS — J188 Other pneumonia, unspecified organism: Secondary | ICD-10-CM | POA: Insufficient documentation

## 2024-11-21 LAB — CBC WITH DIFFERENTIAL/PLATELET
Abs Immature Granulocytes: 0.1 K/uL — ABNORMAL HIGH (ref 0.00–0.07)
Basophils Absolute: 0.1 K/uL (ref 0.0–0.1)
Basophils Relative: 0 %
Eosinophils Absolute: 0 K/uL (ref 0.0–0.5)
Eosinophils Relative: 0 %
HCT: 37.1 % — ABNORMAL LOW (ref 39.0–52.0)
Hemoglobin: 12.2 g/dL — ABNORMAL LOW (ref 13.0–17.0)
Immature Granulocytes: 1 %
Lymphocytes Relative: 11 %
Lymphs Abs: 1.6 K/uL (ref 0.7–4.0)
MCH: 25.9 pg — ABNORMAL LOW (ref 26.0–34.0)
MCHC: 32.9 g/dL (ref 30.0–36.0)
MCV: 78.8 fL — ABNORMAL LOW (ref 80.0–100.0)
Monocytes Absolute: 1.2 K/uL — ABNORMAL HIGH (ref 0.1–1.0)
Monocytes Relative: 8 %
Neutro Abs: 11.9 K/uL — ABNORMAL HIGH (ref 1.7–7.7)
Neutrophils Relative %: 80 %
Platelets: 242 K/uL (ref 150–400)
RBC: 4.71 MIL/uL (ref 4.22–5.81)
RDW: 16.6 % — ABNORMAL HIGH (ref 11.5–15.5)
WBC: 14.8 K/uL — ABNORMAL HIGH (ref 4.0–10.5)
nRBC: 0 % (ref 0.0–0.2)

## 2024-11-21 LAB — URINALYSIS, W/ REFLEX TO CULTURE (INFECTION SUSPECTED)
Bacteria, UA: NONE SEEN
Bilirubin Urine: NEGATIVE
Glucose, UA: >=500 mg/dL — AB
Hgb urine dipstick: NEGATIVE
Ketones, ur: NEGATIVE mg/dL
Leukocytes,Ua: NEGATIVE
Nitrite: NEGATIVE
Protein, ur: 100 mg/dL — AB
Specific Gravity, Urine: 1.013 (ref 1.005–1.030)
pH: 7 (ref 5.0–8.0)

## 2024-11-21 LAB — COMPREHENSIVE METABOLIC PANEL WITH GFR
ALT: 13 U/L (ref 0–44)
AST: 19 U/L (ref 15–41)
Albumin: 3.4 g/dL — ABNORMAL LOW (ref 3.5–5.0)
Alkaline Phosphatase: 84 U/L (ref 38–126)
Anion gap: 13 (ref 5–15)
BUN: 20 mg/dL (ref 8–23)
CO2: 24 mmol/L (ref 22–32)
Calcium: 9.5 mg/dL (ref 8.9–10.3)
Chloride: 90 mmol/L — ABNORMAL LOW (ref 98–111)
Creatinine, Ser: 1.55 mg/dL — ABNORMAL HIGH (ref 0.61–1.24)
GFR, Estimated: 44 mL/min — ABNORMAL LOW (ref >60)
Glucose, Bld: 162 mg/dL — ABNORMAL HIGH (ref 70–99)
Potassium: 5.1 mmol/L (ref 3.5–5.1)
Sodium: 127 mmol/L — ABNORMAL LOW (ref 135–145)
Total Bilirubin: 1.3 mg/dL — ABNORMAL HIGH (ref 0.0–1.2)
Total Protein: 7.7 g/dL (ref 6.5–8.1)

## 2024-11-21 LAB — I-STAT VENOUS BLOOD GAS, ED
Acid-Base Excess: 2 mmol/L (ref 0.0–2.0)
Bicarbonate: 27.3 mmol/L (ref 20.0–28.0)
Calcium, Ion: 1.18 mmol/L (ref 1.15–1.40)
HCT: 40 % (ref 39.0–52.0)
Hemoglobin: 13.6 g/dL (ref 13.0–17.0)
O2 Saturation: 55 %
Potassium: 5 mmol/L (ref 3.5–5.1)
Sodium: 125 mmol/L — ABNORMAL LOW (ref 135–145)
TCO2: 29 mmol/L (ref 22–32)
pCO2, Ven: 44.6 mmHg (ref 44–60)
pH, Ven: 7.395 (ref 7.25–7.43)
pO2, Ven: 29 mmHg — CL (ref 32–45)

## 2024-11-21 LAB — CBG MONITORING, ED: Glucose-Capillary: 151 mg/dL — ABNORMAL HIGH (ref 70–99)

## 2024-11-21 LAB — I-STAT CG4 LACTIC ACID, ED
Lactic Acid, Venous: 4.9 mmol/L (ref 0.5–1.9)
Lactic Acid, Venous: 2.7 mmol/L (ref 0.5–1.9)

## 2024-11-21 LAB — RESP PANEL BY RT-PCR (RSV, FLU A&B, COVID)  RVPGX2
Influenza A by PCR: NEGATIVE
Influenza B by PCR: NEGATIVE
Resp Syncytial Virus by PCR: NEGATIVE
SARS Coronavirus 2 by RT PCR: NEGATIVE

## 2024-11-21 LAB — I-STAT CHEM 8, ED
BUN: 22 mg/dL (ref 8–23)
Calcium, Ion: 1.18 mmol/L (ref 1.15–1.40)
Chloride: 90 mmol/L — ABNORMAL LOW (ref 98–111)
Creatinine, Ser: 1.6 mg/dL — ABNORMAL HIGH (ref 0.61–1.24)
Glucose, Bld: 170 mg/dL — ABNORMAL HIGH (ref 70–99)
HCT: 40 % (ref 39.0–52.0)
Hemoglobin: 13.6 g/dL (ref 13.0–17.0)
Potassium: 5 mmol/L (ref 3.5–5.1)
Sodium: 126 mmol/L — ABNORMAL LOW (ref 135–145)
TCO2: 25 mmol/L (ref 22–32)

## 2024-11-21 LAB — BRAIN NATRIURETIC PEPTIDE: B Natriuretic Peptide: 1240.6 pg/mL — ABNORMAL HIGH (ref 0.0–100.0)

## 2024-11-21 LAB — LACTIC ACID, PLASMA
Lactic Acid, Venous: 2.2 mmol/L (ref 0.5–1.9)
Lactic Acid, Venous: 1.3 mmol/L (ref 0.5–1.9)

## 2024-11-21 LAB — MRSA NEXT GEN BY PCR, NASAL: MRSA by PCR Next Gen: NOT DETECTED

## 2024-11-21 LAB — PROCALCITONIN: Procalcitonin: 1.04 ng/mL

## 2024-11-21 LAB — LIPASE, BLOOD: Lipase: 23 U/L (ref 11–51)

## 2024-11-21 LAB — TROPONIN I (HIGH SENSITIVITY)
Troponin I (High Sensitivity): 23 ng/L — ABNORMAL HIGH (ref <18)
Troponin I (High Sensitivity): 25 ng/L — ABNORMAL HIGH (ref <18)

## 2024-11-21 MED ORDER — PIPERACILLIN-TAZOBACTAM 3.375 G IVPB
3.3750 g | Freq: Three times a day (TID) | INTRAVENOUS | Status: DC
  Administered 2024-11-22 – 2024-11-23 (×4): 3.375 g via INTRAVENOUS
  Filled 2024-11-21 (×5): qty 50

## 2024-11-21 MED ORDER — ONDANSETRON HCL 4 MG PO TABS: 4.0000 mg | ORAL_TABLET | Freq: Four times a day (QID) | ORAL | Status: DC | PRN

## 2024-11-21 MED ORDER — ACETAMINOPHEN 650 MG RE SUPP: 650.0000 mg | Freq: Four times a day (QID) | RECTAL | Status: DC | PRN

## 2024-11-21 MED ORDER — PIPERACILLIN-TAZOBACTAM 3.375 G IVPB 30 MIN
3.3750 g | Freq: Once | INTRAVENOUS | Status: AC
  Administered 2024-11-21: 3.375 g via INTRAVENOUS
  Filled 2024-11-21: qty 50

## 2024-11-21 MED ORDER — ALBUTEROL SULFATE (2.5 MG/3ML) 0.083% IN NEBU: 2.5000 mg | INHALATION_SOLUTION | RESPIRATORY_TRACT | Status: DC | PRN

## 2024-11-21 MED ORDER — VANCOMYCIN HCL 1250 MG/250ML IV SOLN
1250.0000 mg | Freq: Once | INTRAVENOUS | Status: AC
  Administered 2024-11-21: 1250 mg via INTRAVENOUS
  Filled 2024-11-21: qty 250

## 2024-11-21 MED ORDER — GLUCERNA SHAKE PO LIQD
237.0000 mL | Freq: Three times a day (TID) | ORAL | Status: DC
  Administered 2024-11-21 – 2024-11-22 (×4): 237 mL via ORAL

## 2024-11-21 MED ORDER — FUROSEMIDE 10 MG/ML IJ SOLN: 40.0000 mg | Freq: Two times a day (BID) | INTRAMUSCULAR | Status: DC

## 2024-11-21 MED ORDER — ACETAMINOPHEN 325 MG PO TABS: 650.0000 mg | ORAL_TABLET | Freq: Four times a day (QID) | ORAL | Status: DC | PRN

## 2024-11-21 MED ORDER — ENOXAPARIN SODIUM 40 MG/0.4ML IJ SOSY
40.0000 mg | PREFILLED_SYRINGE | INTRAMUSCULAR | Status: DC
  Administered 2024-11-21 – 2024-11-22 (×2): 40 mg via SUBCUTANEOUS
  Filled 2024-11-21 (×2): qty 0.4

## 2024-11-21 MED ORDER — SODIUM CHLORIDE 0.9% FLUSH
3.0000 mL | Freq: Two times a day (BID) | INTRAVENOUS | Status: DC
  Administered 2024-11-21 – 2024-12-08 (×25): 3 mL via INTRAVENOUS

## 2024-11-21 MED ORDER — SODIUM CHLORIDE 0.9 % IV BOLUS
1000.0000 mL | Freq: Once | INTRAVENOUS | Status: AC
  Administered 2024-11-21: 1000 mL via INTRAVENOUS

## 2024-11-21 MED ORDER — PIPERACILLIN-TAZOBACTAM 3.375 G IVPB 30 MIN: 3.3750 g | Freq: Three times a day (TID) | INTRAVENOUS | Status: DC

## 2024-11-21 MED ORDER — FUROSEMIDE 10 MG/ML IJ SOLN
40.0000 mg | Freq: Once | INTRAMUSCULAR | Status: AC
  Administered 2024-11-21: 40 mg via INTRAVENOUS
  Filled 2024-11-21: qty 4

## 2024-11-21 MED ORDER — ONDANSETRON HCL 4 MG/2ML IJ SOLN: 4.0000 mg | Freq: Four times a day (QID) | INTRAMUSCULAR | Status: DC | PRN

## 2024-11-21 NOTE — ED Notes (Signed)
Awaiting vancomycin from main pharmacy

## 2024-11-21 NOTE — H&P (Signed)
 History and Physical    Patient: Andre Stout FMW:[id number] DOB: [birth year] DOA: 11/21/2024 DOS: the patient was seen and examined on 11/21/2024 PCP: Pcp, No  Patient coming from: Home.   Chief Complaint:  Chief Complaint  Patient presents with   Weakness   Edema   HPI: Andre Stout is a 84 y.o. male with medical history significant of diabetes mellitus type 2, hypertension, chronic anemia, hyperlipidemia, history of complete heart block,  and history of prostate cancer presents with weakness and confusion.  History is obtained from his daughter over the phone.  He recently had been hospitalized from 11/2-11/7 after presenting with reports of poor p.o. intake and diarrhea.  Noted to have concern for congestive heart failure exacerbation, AKI, hyponatremia, elevated troponin, possible multifocal pneumonia.  Patient was diuresed creatinine improved and he was treated with 5 days of Rocephin  and doxycycline .  Daughter makes note palliative care was brought up during last hospitalization, but patient did not want to talk at all about hospice.  He experienced a sudden onset of weakness and confusion, which has significantly impacted his daily activities. He is unable to walk with his walker, hold his pills, or a cup, and he did not know his location. This is a deviation from his baseline, where he typically recognizes family and his surroundings, although he cannot recall the date or year.    He has a history of congestive heart failure diagnosed about a month ago and was started on Lasix . He has been hospitalized for fluid overload in the past. He has also been hospitalized for aspiration pneumonia, and there is concern for recurrence due to a bad cough over the last two days and possible breathing difficulties noted this morning.  He is diabetic, with blood sugar levels over 300 this morning, which responded to insulin  administration. His blood sugar levels are described as erratic.  He  underwent a swallow study during a previous hospitalization, which showed mild aspiration on thin liquids. He is currently on a regular diet but prefers baby food, which started after his last hospital stay. He also consumes grits or oatmeal, possibly due to discomfort with his dentures.     In the ED patient was noted to be afebrile with stable vital signs.  Labs significant for WBC 14.8, lactic acid 4.9, BNP 1240.6, high-sensitivity troponin 23 sodium 126, potassium 5, chloride 90, BUN 22, creatinine 1.6, and glucose 170.  Chest x-ray revealed new round right upper lobe consolidation most consistent with developing pneumonia and no pleural effusion or pneumothorax.  CT scan of the chest abdomen pelvis noted large focal areas of pulmonary consolidation in the right upper lobe and left lower lobe with progression of the right upper lobe consolidation and moderate left and minimal right pleural effusions improved since prior study.  Urinalysis did not note any significant signs for infection.  Review of Systems: As mentioned in the history of present illness. All other systems reviewed and are negative. Past Medical History:  Diagnosis Date   CKD (chronic kidney disease) stage 3, GFR 30-59 ml/min (HCC)    Diabetes mellitus without complication (HCC)    Glaucoma    Heart block    complete heart block   History of CVA (cerebrovascular accident)    Hypertension    Prostate cancer (HCC)    Been 3-4 years ago   Past Surgical History:  Procedure Laterality Date   CATARACT EXTRACTION Bilateral    INSERTION PROSTATE RADIATION SEED     IR  EXCHANGE BILIARY DRAIN  08/19/2023   IR EXCHANGE BILIARY DRAIN  12/19/2023   IR EXCHANGE BILIARY DRAIN  01/20/2024   IR PERC CHOLECYSTOSTOMY  08/17/2022   IR RADIOLOGIST EVAL & MGMT  09/28/2022   IR RADIOLOGIST EVAL & MGMT  10/10/2022   Social History:  reports that he quit smoking about 50 years ago. His smoking use included cigarettes. He started smoking about 54  years ago. He has a 2 pack-year smoking history. He has never used smokeless tobacco. He reports that he does not drink alcohol and does not use drugs.  No Known Allergies  Family History  Problem Relation Age of Onset   Other Mother        unknown medical history   Diabetes Father    Diabetes Paternal Grandfather     Prior to Admission medications   Medication Sig Start Date End Date Taking? Authorizing Provider  acetaminophen  (TYLENOL ) 500 MG tablet Take 1,000 mg by mouth every 6 (six) hours as needed for mild pain (pain score 1-3) or moderate pain (pain score 4-6).    [provider]  amLODipine  (NORVASC ) 10 MG tablet Take 1 tablet (10 mg total) by mouth daily. 10/28/24   Wheeler Harlene CROME, NP  atorvastatin  (LIPITOR) 40 MG tablet Take 1 tablet (40 mg total) by mouth daily. 10/28/24   Yacopino, Jessica L, NP  Azelastine  HCl 137 MCG/SPRAY SOLN PLACE 1 SPRAY INTO BOTH NOSTRILS 2 (TWO) TIMES DAILY AS NEEDED. NEEDS APPT Patient taking differently: Place 1 spray into both nostrils 2 (two) times daily as needed (allergies). 07/13/24   Jason Leita Repine, FNP  cyanocobalamin  1000 MCG tablet Take 1 tablet (1,000 mcg total) by mouth daily. 11/07/24   Amin, Burgess BROCKS, MD  ferrous sulfate  325 (65 FE) MG tablet Take 1 tablet (325 mg total) by mouth daily with breakfast. 11/06/24   Amin, Ankit C, MD  furosemide  (LASIX ) 40 MG tablet Take 1 tablet (40 mg total) by mouth daily. 11/06/24 11/06/25  Caleen Burgess BROCKS, MD  insulin  aspart (NOVOLOG  FLEXPEN) 100 UNIT/ML FlexPen Inject under skin  5-7 units 10-15 min before meals 3-4 times a day 11/10/24   Trixie File, MD  LANTUS  SOLOSTAR 100 UNIT/ML Solostar Pen Inject 15 Units into the skin daily. 10/15/24   [provider]  magnesium  oxide (MAG-OX) 400 (240 Mg) MG tablet TAKE 1 TABLET BY MOUTH TWICE A DAY Patient taking differently: Take 400 mg by mouth daily. 02/28/24   Jason Leita Repine, FNP  metFORMIN  (GLUCOPHAGE ) 500 MG tablet Take  1 tablet (500 mg total) by mouth 2 (two) times daily with a meal. 10/28/24   Wheeler Harlene CROME, NP  Multiple Vitamins-Minerals (PRESERVISION AREDS 2) CAPS Take 1 capsule by mouth every evening.    [provider]  omeprazole  (PRILOSEC) 40 MG capsule Take 1 capsule (40 mg total) by mouth in the morning. 10/28/24   Yacopino, Jessica L, NP  potassium chloride  SA (KLOR-CON  M) 20 MEQ tablet Take 1 tablet (20 mEq total) by mouth daily. 11/06/24   Caleen Burgess BROCKS, MD    Physical Exam: Vitals:   11/21/24 1205 11/21/24 1208  BP:  (!) 118/56  Pulse: 84   Resp: 20   Temp: 99.7 F (37.6 C)   TempSrc: Oral   SpO2: 100%     Constitutional: Elderly male who appears to be in no acute distress Eyes: PERRL, lids and conjunctivae normal ENMT: Mucous membranes are moist.   Neck: normal, supple  Respiratory: clear to auscultation  bilaterally, no wheezing, no crackles. Normal respiratory effort. No accessory muscle use.  Cardiovascular: Regular rate and rhythm, no murmurs / rubs / gallops. No extremity edema. 2+ pedal pulses. No carotid bruits.  Abdomen: no tenderness, no masses palpated. No hepatosplenomegaly. Bowel sounds positive.  Musculoskeletal: no clubbing / cyanosis. No joint deformity upper and lower extremities. Good ROM, no contractures. Normal muscle tone.  Skin: no rashes, lesions, ulcers. No induration Neurologic: CN 2-12 grossly intact. Sensation intact, DTR normal. Strength 5/5 in all 4.  Psychiatric: Normal judgment and insight. Alert and oriented x 3. Normal mood.   Data Reviewed:  EKG reveals sinus rhythm 80 bpm with a significant amount of background artifact reviewed labs, imaging, and pertinent records as documented.  Assessment and Plan:  Severe sepsis secondary to pneumonia Patient presented with reports of increased weakness with reports of increasing cough over the last 2 days.  Found to be tachypneic with white blood cell count elevated at 14.8 and initial lactic  acid 4.9.  CT chest abdomen pelvis revealed concern for large focal areas of pulmonary consolidation in the right upper lobe and left lower lobe with progression of the right upper lobe consolidation.  Review of records note previous speech therapy evaluation recommended dysphagia II diet.  Not likely to be aspiration.  Patient had been started on broad-spectrum antibiotics with vancomycin  and cefepime  - Admit to a telemetry bed - Aspiration precautions with elevation head of bed - Incentive spirometry and flutter valve - Follow-up blood cultures - Check procalcitonin and trend lactic acid level - Continue empiric antibiotics vancomycin  and Zosyn   Pleural effusion Heart failure with ejection fraction Present on admission.  BNP noted to be 1240.6 appears lower than prior.  BNP lower than prior hospitalization.  Chest x-ray noted moderate left-sided and minimal right sided pleural effusion present which is improved.  EF noted to be depressed at 35% per last echocardiogram on 11/3.  No further invasive evaluation was recommended. - Strict I&Os and daily weight - Lasix  40 mg IV x 1 dose this evening.  Reassess and determine when to transition to p.o.  Acute metabolic encephalopathy Cognitive impairment Patient noted to be acutely altered and unable to recognize family or location.  At baseline reportedly is able to recognize family and location although normally does not know the year.  Possibly to be multifactorial in nature - Delirium precautions  Hyponatremia Acute on chronic.  Sodium noted to be as low as 127.  During prior hospitalization sodium improved with diuresis. - Continue to monitor sodium levels  Chronic kidney disease stage IIIb Creatinine noted to be 1.55 which appears around patient's most recent discharge. - Continue to monitor  Microcytic anemia Chronic.  Hemoglobin noted to be 12.2 with low MCV and MCH similar to prior. - Continue to monitor  DVT prophylaxis: Lovenox    Advance Care Planning:   Code Status: Limited: Do not attempt resuscitation (DNR) -DNR-LIMITED -Do Not Intubate/DNI    Consults: None  Family Communication: Daughter updated over the phone  Severity of Illness: The appropriate patient status for this patient is INPATIENT. Inpatient status is judged to be reasonable and necessary in order to provide the required intensity of service to ensure the patient's safety. The patient's presenting symptoms, physical exam findings, and initial radiographic and laboratory data in the context of their chronic comorbidities is felt to place them at high risk for further clinical deterioration. Furthermore, it is not anticipated that the patient will be medically stable for discharge from the  hospital within 2 midnights of admission.   * I certify that at the point of admission it is my clinical judgment that the patient will require inpatient hospital care spanning beyond 2 midnights from the point of admission due to high intensity of service, high risk for further deterioration and high frequency of surveillance required.*  Author: Maximino DELENA Sharps, MD 11/21/2024 1:26 PM  For on call review [email or web address].

## 2024-11-21 NOTE — Progress Notes (Signed)
 ED Pharmacy Antibiotic Sign Off An antibiotic consult was received from an ED provider for vancomycin  per pharmacy dosing for pneumonia. A chart review was completed to assess appropriateness.  A single dose of zosyn  placed by the ED provider.   The following one time order(s) were placed per pharmacy consult:  vancomycin  1250 mg x 1 dose MRSA PCR  Further antibiotic and/or antibiotic pharmacy consults should be ordered by the admitting provider if indicated.   Thank you for allowing pharmacy to be a part of this patient's care.   Dorn Buttner, PharmD, BCPS 11/21/2024 1:23 PM ED Clinical Pharmacist -  [phone]

## 2024-11-21 NOTE — ED Provider Notes (Signed)
 [place] EMERGENCY DEPARTMENT AT [facility] Provider Note   CSN: [id number] Arrival date & time: 11/21/24  1200     Patient presents with: Weakness and Edema   Andre Stout is a 84 y.o. male.   84 year old male with prior medical history as detailed below presents for evaluation.  Patient with increased weakness x 2 to 3 days.  Patient with increasing bilateral lower extremity edema.  Patient with history of CHF.  Patient is taking Lasix  as prescribed.  Patient with history of dementia.  He appears to be comfortable on arrival.  He appears to be at mental status baseline.  Prior medical history includes DM2, HTN, chronic anemia, HLD, complete heart block, prostate cancer.  Last admission was earlier this month.  The history is provided by the patient and medical records.       Prior to Admission medications   Medication Sig Start Date End Date Taking? Authorizing Provider  acetaminophen  (TYLENOL ) 500 MG tablet Take 1,000 mg by mouth every 6 (six) hours as needed for mild pain (pain score 1-3) or moderate pain (pain score 4-6).    [provider]  amLODipine  (NORVASC ) 10 MG tablet Take 1 tablet (10 mg total) by mouth daily. 10/28/24   Wheeler Harlene CROME, NP  atorvastatin  (LIPITOR) 40 MG tablet Take 1 tablet (40 mg total) by mouth daily. 10/28/24   Yacopino, Jessica L, NP  Azelastine  HCl 137 MCG/SPRAY SOLN PLACE 1 SPRAY INTO BOTH NOSTRILS 2 (TWO) TIMES DAILY AS NEEDED. NEEDS APPT Patient taking differently: Place 1 spray into both nostrils 2 (two) times daily as needed (allergies). 07/13/24   Jason Leita Repine, FNP  Continuous Glucose Sensor (FREESTYLE LIBRE 3 PLUS SENSOR) MISC 1 each by Does not apply route every 14 (fourteen) days. 10/27/24   Trixie File, MD  cyanocobalamin  1000 MCG tablet Take 1 tablet (1,000 mcg total) by mouth daily. 11/07/24   Amin, Burgess BROCKS, MD  ferrous sulfate  325 (65 FE) MG tablet Take 1 tablet (325 mg total) by mouth daily  with breakfast. 11/06/24   Amin, Ankit C, MD  furosemide  (LASIX ) 40 MG tablet Take 1 tablet (40 mg total) by mouth daily. 11/06/24 11/06/25  Caleen Burgess BROCKS, MD  insulin  aspart (NOVOLOG  FLEXPEN) 100 UNIT/ML FlexPen Inject under skin  5-7 units 10-15 min before meals 3-4 times a day 11/10/24   Trixie File, MD  Insulin  Pen Needle 32G X 4 MM MISC Use to inject insulin  4 times a day 10/27/24   Trixie File, MD  Insulin  Syringe-Needle U-100 (BD INSULIN  SYRINGE U/F) 31G X 5/16 1 ML MISC USE 2 DAILY 11/21/22   Trixie File, MD  LANTUS  SOLOSTAR 100 UNIT/ML Solostar Pen Inject 15 Units into the skin daily. 10/15/24   [provider]  magnesium  oxide (MAG-OX) 400 (240 Mg) MG tablet TAKE 1 TABLET BY MOUTH TWICE A DAY Patient taking differently: Take 400 mg by mouth daily. 02/28/24   Jason Leita Repine, FNP  metFORMIN  (GLUCOPHAGE ) 500 MG tablet Take 1 tablet (500 mg total) by mouth 2 (two) times daily with a meal. 10/28/24   Wheeler Harlene CROME, NP  Multiple Vitamins-Minerals (PRESERVISION AREDS 2) CAPS Take 1 capsule by mouth every evening.    [provider]  omeprazole  (PRILOSEC) 40 MG capsule Take 1 capsule (40 mg total) by mouth in the morning. 10/28/24   Wheeler Harlene CROME, NP  ONETOUCH DELICA LANCETS 33G MISC Use to check blood sugar 4 times per day. Dx code: E11.9 03/27/16  Kassie Mallick, MD  [facility] ULTRA test strip USE TO MONITOR GLUCOSE LEVELS 4 TIMES PER DAY E11.9 06/10/19   Kassie Mallick, MD  potassium chloride  SA (KLOR-CON  M) 20 MEQ tablet Take 1 tablet (20 mEq total) by mouth daily. 11/06/24   Caleen Burgess BROCKS, MD    Allergies: Patient has no known allergies.    Review of Systems  All other systems reviewed and are negative.   Updated Vital Signs There were no vitals taken for this visit.  Physical Exam Vitals and nursing note reviewed.  Constitutional:      General: He is not in acute distress.    Appearance: He is well-developed.  HENT:     Head:  Normocephalic and atraumatic.  Eyes:     Conjunctiva/sclera: Conjunctivae normal.  Cardiovascular:     Rate and Rhythm: Normal rate and regular rhythm.     Heart sounds: No murmur heard. Pulmonary:     Effort: Pulmonary effort is normal. No respiratory distress.     Breath sounds: Normal breath sounds.  Abdominal:     Palpations: Abdomen is soft.     Tenderness: There is no abdominal tenderness.  Musculoskeletal:        General: No swelling.     Cervical back: Neck supple.  Skin:    General: Skin is warm and dry.     Capillary Refill: Capillary refill takes less than 2 seconds.  Neurological:     Mental Status: He is alert.  Psychiatric:        Mood and Affect: Mood normal.     (all labs ordered are listed, but only abnormal results are displayed) Labs Reviewed  CULTURE, BLOOD (ROUTINE X 2)  CULTURE, BLOOD (ROUTINE X 2)  RESP PANEL BY RT-PCR (RSV, FLU A&B, COVID)  [id number]  CBC WITH DIFFERENTIAL/PLATELET  BRAIN NATRIURETIC PEPTIDE  COMPREHENSIVE METABOLIC PANEL WITH GFR  LIPASE, BLOOD  URINALYSIS, W/ REFLEX TO CULTURE (INFECTION SUSPECTED)  I-STAT VENOUS BLOOD GAS, ED  I-STAT CHEM 8, ED  I-STAT CG4 LACTIC ACID, ED  CBG MONITORING, ED  TROPONIN I (HIGH SENSITIVITY)    EKG: None  Radiology: No results found.   Procedures   Medications Ordered in the ED - No data to display                                  Medical Decision Making Patient with multiple comorbidities presents with increased weakness and disability.  Patient appears to be near baseline mental status.  Obtained imaging and workup reveals acute hyponatremia and signs of likely aspiration pneumonia.  Antibiotics initiated here in the ED for treatment of suspected aspiration pneumonia.  Patient would benefit from admission.  Hospitalist service made aware of case.  Amount and/or Complexity of Data Reviewed Labs: ordered. Radiology: ordered.  Risk Prescription drug management. Decision  regarding hospitalization.   CRITICAL CARE Performed by: Maude BROCKS Galloway   Total critical care time: 30 minutes  Critical care time was exclusive of separately billable procedures and treating other patients.  Critical care was necessary to treat or prevent imminent or life-threatening deterioration.  Critical care was time spent personally by me on the following activities: development of treatment plan with patient and/or surrogate as well as nursing, discussions with consultants, evaluation of patient's response to treatment, examination of patient, obtaining history from patient or surrogate, ordering and performing treatments and interventions, ordering and review of laboratory studies, ordering and  review of radiographic studies, pulse oximetry and re-evaluation of patient's condition.      Final diagnoses:  Altered mental status, unspecified altered mental status type  Hyponatremia  Aspiration pneumonia, unspecified aspiration pneumonia type, unspecified laterality, unspecified part of lung [facility])    ED Discharge Orders     None          Laurice Maude BROCKS, MD 11/21/24 1345

## 2024-11-21 NOTE — ED Triage Notes (Signed)
 BIB EMS from home r/t increased weakness and bilateral lower leg swelling. Recently diagnosed with CHF and takes lasix . Denies any pain. Family report patient unable to ambulate well anymore. Hx dementia and at cognitive baseline.

## 2024-11-21 NOTE — ED Notes (Signed)
 Pt to CT

## 2024-11-22 DIAGNOSIS — A419 Sepsis, unspecified organism: Secondary | ICD-10-CM | POA: Diagnosis not present

## 2024-11-22 DIAGNOSIS — J189 Pneumonia, unspecified organism: Secondary | ICD-10-CM | POA: Diagnosis not present

## 2024-11-22 LAB — CBC
HCT: 31.2 % — ABNORMAL LOW (ref 39.0–52.0)
Hemoglobin: 10.4 g/dL — ABNORMAL LOW (ref 13.0–17.0)
MCH: 25.9 pg — ABNORMAL LOW (ref 26.0–34.0)
MCHC: 33.3 g/dL (ref 30.0–36.0)
MCV: 77.6 fL — ABNORMAL LOW (ref 80.0–100.0)
Platelets: 189 K/uL (ref 150–400)
RBC: 4.02 MIL/uL — ABNORMAL LOW (ref 4.22–5.81)
RDW: 16.9 % — ABNORMAL HIGH (ref 11.5–15.5)
WBC: 21.5 K/uL — ABNORMAL HIGH (ref 4.0–10.5)
nRBC: 0 % (ref 0.0–0.2)

## 2024-11-22 LAB — BASIC METABOLIC PANEL WITH GFR
Anion gap: 16 — ABNORMAL HIGH (ref 5–15)
BUN: 26 mg/dL — ABNORMAL HIGH (ref 8–23)
CO2: 20 mmol/L — ABNORMAL LOW (ref 22–32)
Calcium: 9 mg/dL (ref 8.9–10.3)
Chloride: 90 mmol/L — ABNORMAL LOW (ref 98–111)
Creatinine, Ser: 1.71 mg/dL — ABNORMAL HIGH (ref 0.61–1.24)
GFR, Estimated: 39 mL/min — ABNORMAL LOW (ref >60)
Glucose, Bld: 283 mg/dL — ABNORMAL HIGH (ref 70–99)
Potassium: 4.9 mmol/L (ref 3.5–5.1)
Sodium: 126 mmol/L — ABNORMAL LOW (ref 135–145)

## 2024-11-22 MED ORDER — IPRATROPIUM-ALBUTEROL 0.5-2.5 (3) MG/3ML IN SOLN
3.0000 mL | Freq: Four times a day (QID) | RESPIRATORY_TRACT | Status: DC
  Administered 2024-11-22: 3 mL via RESPIRATORY_TRACT
  Filled 2024-11-22: qty 3

## 2024-11-22 MED ORDER — VITAMIN B-12 1000 MCG PO TABS
1000.0000 ug | ORAL_TABLET | Freq: Every day | ORAL | Status: DC
  Administered 2024-11-25 – 2024-12-08 (×12): 1000 ug via ORAL
  Filled 2024-11-22 (×13): qty 1

## 2024-11-22 MED ORDER — GUAIFENESIN ER 600 MG PO TB12: 600.0000 mg | ORAL_TABLET | Freq: Two times a day (BID) | ORAL | Status: DC

## 2024-11-22 MED ORDER — CYANOCOBALAMIN 1000 MCG/ML IJ SOLN
1000.0000 ug | Freq: Once | INTRAMUSCULAR | Status: AC
  Administered 2024-11-22: 1000 ug via INTRAMUSCULAR
  Filled 2024-11-22: qty 1

## 2024-11-22 MED ORDER — FUROSEMIDE 10 MG/ML IJ SOLN
40.0000 mg | Freq: Two times a day (BID) | INTRAMUSCULAR | Status: AC
  Administered 2024-11-22 (×2): 40 mg via INTRAVENOUS
  Filled 2024-11-22 (×2): qty 4

## 2024-11-22 MED ORDER — IPRATROPIUM-ALBUTEROL 0.5-2.5 (3) MG/3ML IN SOLN
3.0000 mL | Freq: Three times a day (TID) | RESPIRATORY_TRACT | Status: DC
  Administered 2024-11-22 – 2024-11-23 (×2): 3 mL via RESPIRATORY_TRACT
  Filled 2024-11-22 (×2): qty 3

## 2024-11-22 NOTE — Plan of Care (Signed)
   Problem: Clinical Measurements: Goal: Will remain free from infection Outcome: Progressing Goal: Cardiovascular complication will be avoided Outcome: Progressing

## 2024-11-22 NOTE — Progress Notes (Addendum)
 PROGRESS NOTE    Andre Stout  FMW:[id number] DOB: Jun 03, 1940 DOA: 11/21/2024 PCP: Pcp, No  84/M chronically ill with type 2 diabetes mellitus, hypertension, chronic anemia, complete heart block, prostate cancer, recently hospitalized 11/2-7 presented with failure to thrive, poor oral intake and diarrhea, noted to be in CHF exacerbation, AKI, hyponatremia and multifocal pneumonia, treated with antibiotics, diuresed, there was discussion on palliative care but not pursued.  Brought to the ED 11/22 with increased weakness and confusion, also history of worsening cough for few days, some dyspnea.  In the ED afebrile, vital stable, CBG over 300, WBC 14.8, lactate 4.9, BNP 1240, troponin 23, creatinine 1.6, chest x-ray noted new right upper lobe consolidation concerning for developing pneumonia, CT chest abdomen pelvis noted large focal areas of pulmonary consolidation right upper lobe and left lower lobe with progression of right upper lobe disease and moderate left and minimal right pleural effusions improved since prior study   Subjective: -Poor historian unable to provide any meaningful information this morning, wants to be left alone, irritable  Assessment and Plan:  Severe sepsis secondary to pneumonia Dysphagia Patient presented with reports of increased weakness with reports of increasing cough over the last 2 days.  Found to be tachypneic with white blood cell count elevated at 14.8 and initial lactic acid 4.9.  CT chest abdomen pelvis revealed concern for large focal areas of pulmonary consolidation in the right upper lobe and left lower lobe with progression of the right upper lobe consolidation.  Review of records note previous speech therapy evaluation recommended dysphagia II diet - Continue IV Zosyn  today, aspiration precautions - Will request SLP reeval - Follow-up blood cultures -Pulmonary toilet, add DuoNebs, Mucinex   Has significant chronic disease burden, will request  palliative care consult, discussed hospice with dtr, she understands poor prognosis, but patient was apparently reluctant to consider  hospice in the past, may need to avoid using that term  Dementia -memory and cognitive decline for 3years  Intermittent complete heart block -Followed by cardiology as recent as few weeks ago, patient declined PPM, also felt to be a poor candidate for invasive management   Pleural effusion Acute on chronic diastolic CHF BNP 8759, chest x-ray noted moderate left-sided and minimal right sided pleural effusion present which is improved.   - Last echo few weeks ago in 11/25 with a EF 35-40% with wall motion abnormality, mildly reduced RV  -EF noted to be depressed at 35% per last echocardiogram on 11/3.  No further invasive evaluation was recommended. - Will repeat IV Lasix  today, add Aldactone tomorrow if creatinine stable   Acute metabolic encephalopathy Cognitive impairment Patient noted to be acutely altered and unable to recognize family or location.  At baseline reportedly is able to recognize family and location although normally does not know the year.  Possibly to be multifactorial in nature - TSH was normal recently, B12 significantly low, add replacement  -delirium precautions   Hyponatremia Acute on chronic.  Sodium noted to be as low as 127.  During prior hospitalization sodium improved with diuresis. - Continue to monitor sodium levels   Chronic kidney disease stage IIIb Creatinine noted to be 1.55 which appears around patient's most recent discharge. - Continue to monitor   Microcytic anemia Chronic.  Hemoglobin noted to be 12.2 with low MCV and MCH similar to prior. - Continue to monitor   DVT prophylaxis: Lovenox  Code Status: DNR Family Communication: No family at bedside, will update daughter Disposition Plan:   Consultants:  Procedures:   Antimicrobials:    Objective: Vitals:   11/22/24 0343 11/22/24 0735 11/22/24  1112 11/22/24 1114  BP:  (!) 114/41 (!) 115/41   Pulse:    61  Resp:  19 17 17   Temp:  98 F (36.7 C) 99.1 F (37.3 C) 99.1 F (37.3 C)  TempSrc:  Oral Axillary   SpO2:  99% 97% 97%  Weight: 60.4 kg     Height:        Intake/Output Summary (Last 24 hours) at 11/22/2024 1123 Last data filed at 11/22/2024 0700 Gross per 24 hour  Intake 521.58 ml  Output 600 ml  Net -78.42 ml   Filed Weights   11/21/24 1740 11/22/24 0343  Weight: 60.1 kg 60.4 kg    Examination:  General exam: Chronically ill, very poor historian, awake alert, oriented to self only, moderate cognitive deficits Respiratory system: Scattered rhonchi, decreased breath sounds at the bases Cardiovascular system: S1 & S2 heard, RRR.  Abd: nondistended, soft and nontender.Normal bowel sounds heard. Central nervous system: Alert and oriented. No focal neurological deficits. Extremities: no edema Skin: No rashes Psychiatry: Flat affect   Data Reviewed:   CBC: Recent Labs  Lab 11/21/24 1229 11/21/24 1247 11/21/24 1248 11/22/24 0153  WBC 14.8*  --   --  21.5*  NEUTROABS 11.9*  --   --   --   HGB 12.2* 13.6 13.6 10.4*  HCT 37.1* 40.0 40.0 31.2*  MCV 78.8*  --   --  77.6*  PLT 242  --   --  189   Basic Metabolic Panel: Recent Labs  Lab 11/21/24 1229 11/21/24 1247 11/21/24 1248 11/22/24 0153  NA 127* 125* 126* 126*  K 5.1 5.0 5.0 4.9  CL 90*  --  90* 90*  CO2 24  --   --  20*  GLUCOSE 162*  --  170* 283*  BUN 20  --  22 26*  CREATININE 1.55*  --  1.60* 1.71*  CALCIUM  9.5  --   --  9.0   GFR: Estimated Creatinine Clearance: 27.5 mL/min (A) (by C-G formula based on SCr of 1.71 mg/dL (H)). Liver Function Tests: Recent Labs  Lab 11/21/24 1229  AST 19  ALT 13  ALKPHOS 84  BILITOT 1.3*  PROT 7.7  ALBUMIN  3.4*   Recent Labs  Lab 11/21/24 1229  LIPASE 23   No results for input(s): AMMONIA in the last 168 hours. Coagulation Profile: No results for input(s): INR, PROTIME in the last  168 hours. Cardiac Enzymes: No results for input(s): CKTOTAL, CKMB, CKMBINDEX, TROPONINI in the last 168 hours. BNP (last 3 results) No results for input(s): PROBNP in the last 8760 hours. HbA1C: No results for input(s): HGBA1C in the last 72 hours. CBG: Recent Labs  Lab 11/21/24 1300  GLUCAP 151*   Lipid Profile: No results for input(s): CHOL, HDL, LDLCALC, TRIG, CHOLHDL, LDLDIRECT in the last 72 hours. Thyroid  Function Tests: No results for input(s): TSH, T4TOTAL, FREET4, T3FREE, THYROIDAB in the last 72 hours. Anemia Panel: No results for input(s): VITAMINB12, FOLATE, FERRITIN, TIBC, IRON, RETICCTPCT in the last 72 hours. Urine analysis:    Component Value Date/Time   COLORURINE YELLOW 11/21/2024 1229   APPEARANCEUR CLEAR 11/21/2024 1229   LABSPEC 1.013 11/21/2024 1229   PHURINE 7.0 11/21/2024 1229   GLUCOSEU >=500 (A) 11/21/2024 1229   HGBUR NEGATIVE 11/21/2024 1229   BILIRUBINUR NEGATIVE 11/21/2024 1229   KETONESUR NEGATIVE 11/21/2024 1229   PROTEINUR 100 (A) 11/21/2024 1229  UROBILINOGEN 1.0 10/04/2009 1109   NITRITE NEGATIVE 11/21/2024 1229   LEUKOCYTESUR NEGATIVE 11/21/2024 1229   Sepsis Labs: @LABRCNTIP (procalcitonin:4,lacticidven:4)  ) Recent Results (from the past 240 hours)  Resp panel by RT-PCR (RSV, Flu A&B, Covid) Peripheral     Status: None   Collection Time: 11/21/24 12:29 PM   Specimen: Peripheral; Nasal Swab  Result Value Ref Range Status   SARS Coronavirus 2 by RT PCR NEGATIVE NEGATIVE Final   Influenza A by PCR NEGATIVE NEGATIVE Final   Influenza B by PCR NEGATIVE NEGATIVE Final    Comment: (NOTE) The Xpert Xpress SARS-CoV-2/FLU/RSV plus assay is intended as an aid in the diagnosis of influenza from Nasopharyngeal swab specimens and should not be used as a sole basis for treatment. Nasal washings and aspirates are unacceptable for Xpert Xpress SARS-CoV-2/FLU/RSV testing.  Fact Sheet for  Patients: bloggercourse.com  Fact Sheet for Healthcare Providers: seriousbroker.it  This test is not yet approved or cleared by the [place]  FDA and has been authorized for detection and/or diagnosis of SARS-CoV-2 by FDA under an Emergency Use Authorization (EUA). This EUA will remain in effect (meaning this test can be used) for the duration of the COVID-19 declaration under Section 564(b)(1) of the Act, 21 U.S.C. section 360bbb-3(b)(1), unless the authorization is terminated or revoked.     Resp Syncytial Virus by PCR NEGATIVE NEGATIVE Final    Comment: (NOTE) Fact Sheet for Patients: bloggercourse.com  Fact Sheet for Healthcare Providers: seriousbroker.it  This test is not yet approved or cleared by the [place]  FDA and has been authorized for detection and/or diagnosis of SARS-CoV-2 by FDA under an Emergency Use Authorization (EUA). This EUA will remain in effect (meaning this test can be used) for the duration of the COVID-19 declaration under Section 564(b)(1) of the Act, 21 U.S.C. section 360bbb-3(b)(1), unless the authorization is terminated or revoked.  Performed at [facility], [address], [place], [place] [address]   MRSA Next Gen by PCR, Nasal     Status: None   Collection Time: 11/21/24  5:50 PM   Specimen: Nasal Mucosa; Nasal Swab  Result Value Ref Range Status   MRSA by PCR Next Gen NOT DETECTED NOT DETECTED Final    Comment: (NOTE) The GeneXpert MRSA Assay (FDA approved for NASAL specimens only), is one component of a comprehensive MRSA colonization surveillance program. It is not intended to diagnose MRSA infection nor to guide or monitor treatment for MRSA infections. Test performance is not FDA approved in patients less than 49 years old. Performed at [facility], [address] [place], [place] [address]      Radiology  Studies: DG Chest Port 1 View Result Date: 11/21/2024 EXAM: 1 VIEW(S) XRAY OF THE CHEST 11/21/2024 01:17:00 PM COMPARISON: 11/05/2024 CLINICAL HISTORY: sob sob FINDINGS: LUNGS AND PLEURA: Shallow inspiration. There is a new rounded consolidation demonstrated in the right upper lobe. Given the short time frame of involvement, this is likely a developing area of pneumonia. Follow-up to resolution is recommended. No pleural effusion or pneumothorax. HEART AND MEDIASTINUM: Heart size and pulmonary vascularity are normal. Calcification of the aorta. BONES AND SOFT TISSUES: Degenerative changes in the spine and shoulders. IMPRESSION: 1. New rounded right upper lobe consolidation, most consistent with developing pneumonia. Recommend imaging follow-up to ensure resolution. 2. No pleural effusion or pneumothorax. Electronically signed by: Elsie Gravely MD 11/21/2024 01:27 PM EST RP Workstation: [id number]   CT Head Wo Contrast Result Date: 11/21/2024 EXAM: CT HEAD WITHOUT  CONTRAST 11/21/2024 12:57:43 PM TECHNIQUE: CT of the head was performed without the administration of intravenous contrast. Automated exposure control, iterative reconstruction, and/or weight based adjustment of the mA/kV was utilized to reduce the radiation dose to as low as reasonably achievable. COMPARISON: 11/01/2024 CLINICAL HISTORY: Mental status change, unknown cause. FINDINGS: BRAIN AND VENTRICLES: No acute hemorrhage. No evidence of acute infarct. Proportional prominence of ventricles and sulci, consistent with diffuse cerebral parenchymal volume loss. Scattered periventricular white matter hypoattenuation, consistent with mild chronic ischemic microvascular disease. Chronic right cerebellar infarct. No hydrocephalus. No extra-axial collection. No mass effect or midline shift. ORBITS: Bilateral lens replacement. SINUSES: Partial left mastoid air cell effusion. SOFT TISSUES AND SKULL: No acute soft tissue abnormality. No skull fracture.  Calcified atherosclerotic plaque in cavernous/supraclinoid ICA and intradural vertebral arteries. IMPRESSION: 1. No acute intracranial abnormality. 2. Stable diffuse cerebral parenchymal volume loss with mild chronic ischemic microvascular disease. 3. Chronic right cerebellar infarct. Electronically signed by: Lonni Necessary MD 11/21/2024 01:10 PM EST RP Workstation: [id number]   CT CHEST ABDOMEN PELVIS WO CONTRAST Result Date: 11/21/2024 EXAM: CT CHEST, ABDOMEN AND PELVIS WITHOUT CONTRAST 11/21/2024 12:57:43 PM TECHNIQUE: CT of the chest, abdomen and pelvis was performed without the administration of intravenous contrast. Multiplanar reformatted images are provided for review. Automated exposure control, iterative reconstruction, and/or weight based adjustment of the mA/kV was utilized to reduce the radiation dose to as low as reasonably achievable. Examination is technically limited due to nonstandard patient positioning. Motion artifact limits the examination. COMPARISON: 11/01/2024 CLINICAL HISTORY: Sepsis. FINDINGS: CHEST: MEDIASTINUM AND LYMPH NODES: Heart and pericardium are unremarkable. The central airways are clear. No mediastinal, hilar or axillary lymphadenopathy. LUNGS AND PLEURA: Large focal areas of pulmonary consolidation are demonstrated in the right upper lobe and in the left lower lobe. Right upper lobe consolidation is progressing since the previous study. Basilar congestion is improved. Moderate left and minimal right pleural effusions are also improved since the prior study. No pneumothorax. ABDOMEN AND PELVIS: LIVER: Unenhanced appearance of the liver is normal. GALLBLADDER AND BILE DUCTS: Unenhanced appearance of the gallbladder is normal. No biliary ductal dilatation. SPLEEN: Unenhanced appearance of the spleen is normal. PANCREAS: Unenhanced appearance of the pancreas is normal. ADRENAL GLANDS: Unenhanced appearance of the adrenal glands is normal. KIDNEYS, URETERS AND BLADDER:  Large right renal cyst measuring 10.4 cm diameter. 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 No change since prior study. This is likely benign and no imaging follow-up is indicated. No stones in the kidneys or ureters. No hydronephrosis. No perinephric or periureteral stranding. The bladder is decompressed, but the bladder wall appears diffusely thickened, possibly due to outlet obstruction or cystitis. Correlate with urinalysis. GI AND BOWEL: Stomach demonstrates no acute abnormality. There is no bowel obstruction. REPRODUCTIVE ORGANS: Prostate seed implants. PERITONEUM AND RETROPERITONEUM: No ascites. No free air. VASCULATURE: Aorta is normal in caliber. Calcification of the aorta. No aneurysm. ABDOMINAL AND PELVIS LYMPH NODES: No significant lymphadenopathy. BONES AND SOFT TISSUES: Degenerative changes in the spine. Compression of the L1 vertebra with vertebra plana. No change. No focal bone lesions. No acute osseous abnormality. No focal soft tissue abnormality. IMPRESSION: 1. Large focal areas of pulmonary consolidation in the right upper lobe and left lower lobe, with progression of the right upper lobe consolidation since the previous study. Basilar congestion is improved. Changes could represent pneumonia or aspiration. 2. Moderate left and minimal right pleural effusions, improved since the prior study. 3. Bladder wall thickening, possibly due to outlet obstruction  or cystitis. Recommend correlation with urinalysis. 4. 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 Stable and no imaging follow-up is indicated. Electronically signed by: Elsie Gravely MD 11/21/2024 01:05 PM EST RP Workstation: [id number]     Scheduled Meds:  enoxaparin  (LOVENOX ) injection  40 mg Subcutaneous Q24H   feeding supplement (GLUCERNA SHAKE)  237 mL Oral TID BM   sodium chloride  flush  3 mL Intravenous Q12H   Continuous Infusions:   piperacillin -tazobactam (ZOSYN )  IV 3.375 g (11/22/24 0738)     LOS: 1 day    Time spent:    Sigurd Pac, MD [facility]   11/22/2024, 11:23 AM

## 2024-11-22 NOTE — Evaluation (Signed)
 Clinical/Bedside Swallow Evaluation Patient Details  Name: Andre Stout MRN: [id number] Date of Birth: 10/17/1940  Today's Date: 11/22/2024 Time: SLP Start Time (ACUTE ONLY): 1513 SLP Stop Time (ACUTE ONLY): 1524 SLP Time Calculation (min) (ACUTE ONLY): 11 min  Past Medical History:  Past Medical History:  Diagnosis Date   CKD (chronic kidney disease) stage 3, GFR 30-59 ml/min (HCC)    Diabetes mellitus without complication (HCC)    Glaucoma    Heart block    complete heart block   History of CVA (cerebrovascular accident)    Hypertension    Prostate cancer (HCC)    Been 3-4 years ago   Past Surgical History:  Past Surgical History:  Procedure Laterality Date   CATARACT EXTRACTION Bilateral    INSERTION PROSTATE RADIATION SEED     IR EXCHANGE BILIARY DRAIN  08/19/2023   IR EXCHANGE BILIARY DRAIN  12/19/2023   IR EXCHANGE BILIARY DRAIN  01/20/2024   IR PERC CHOLECYSTOSTOMY  08/17/2022   IR RADIOLOGIST EVAL & MGMT  09/28/2022   IR RADIOLOGIST EVAL & MGMT  10/10/2022   HPI:  Andre Stout is an 84 y/o, chronically ill male. Recently hospitalized 11/2-7 due to failure to thrive, poor oral intake and diarrhea, noted to be in CHF exacerbation, AKI, hyponatremia and multifocal pneumonia, treated with antibiotics, diuresed, there was discussion on palliative care but not pursued.  Brought to the ED 11/22 with increased weakness and confusion, also history of worsening cough for few days, some dyspnea.  Chest x-ray noted new right upper lobe consolidation concerning for developing pneumonia, CT chest abdomen pelvis noted large focal areas of pulmonary consolidation right upper lobe and left lower lobe with progression of right upper lobe disease and moderate left and minimal right pleural effusions improved since prior study. Patient most recently seen by SLP 08/2024 during which he was placed on a dysphagia 1, thin liquid diet. Perviously seen in July 2025 with a cognitively based dysphagia.   PMH of  type 2 diabetes mellitus, dementia,hypertension, chronic anemia, complete heart block, prostate cancer.    Assessment / Plan / Recommendation  Clinical Impression  Swallow evaluation complete. Patient presents with a functional oropharyngeal swallow. He required frequent verbal and tactile cueing to maintain adequate level of alertness for po intake but with cues, patient able to consume thin liquids via straw, serial swallows, pureed and regular texture solids all with appropriate oral transit of bolus and without overt indication of aspiration. Called daughter to obtain information regarding baseline swallowing function without response. Based on history and current presentation, patient appropriate to resume a regular diet. Given h/o dementia with likely fluctuations in mentation as well as now recurrent PNA, recommend instrumental testing to evaluate swallowing physiology and ensure least restrictive diet in place. SLP Visit Diagnosis: Dysphagia, unspecified (R13.10)    Aspiration Risk  Mild aspiration risk    Diet Recommendation Regular;Thin liquid    Liquid Administration via: Cup;Straw Medication Administration: Whole meds with liquid (as tolerated) Supervision: Staff to assist with self feeding Compensations: Slow rate;Small sips/bites Postural Changes: Seated upright at 90 degrees    Other  Recommendations Oral Care Recommendations: Oral care BID      Swallow Study   General HPI: Andre Stout is an 84 y/o, chronically ill male. Recently hospitalized 11/2-7 due to failure to thrive, poor oral intake and diarrhea, noted to be in CHF exacerbation, AKI, hyponatremia and multifocal pneumonia, treated with antibiotics, diuresed, there was discussion on palliative care but not pursued.  Brought to the ED 11/22 with increased weakness and confusion, also history of worsening cough for few days, some dyspnea.  Chest x-ray noted new right upper lobe consolidation concerning for  developing pneumonia, CT chest abdomen pelvis noted large focal areas of pulmonary consolidation right upper lobe and left lower lobe with progression of right upper lobe disease and moderate left and minimal right pleural effusions improved since prior study. Patient most recently seen by SLP 08/2024 during which he was placed on a dysphagia 1, thin liquid diet. Perviously seen in July 2025 with a cognitively based dysphagia.  PMH of  type 2 diabetes mellitus, dementia,hypertension, chronic anemia, complete heart block, prostate cancer. Type of Study: Bedside Swallow Evaluation Previous Swallow Assessment: See HPI Diet Prior to this Study: Dysphagia 2 (finely chopped);Thin liquids (Level 0) Temperature Spikes Noted: No Respiratory Status: Room air History of Recent Intubation: No Behavior/Cognition: Lethargic/Drowsy;Requires cueing Oral Cavity Assessment: Within Functional Limits Oral Care Completed by SLP: No Self-Feeding Abilities: Needs assist Patient Positioning: Upright in bed Baseline Vocal Quality: Normal Volitional Cough: Strong Volitional Swallow: Able to elicit    Oral/Motor/Sensory Function Overall Oral Motor/Sensory Function: Within functional limits   Ice Chips Ice chips: Not tested   Thin Liquid Thin Liquid: Within functional limits Presentation: Straw    Nectar Thick Nectar Thick Liquid: Not tested   Honey Thick Honey Thick Liquid: Not tested   Puree Puree: Within functional limits Presentation: Spoon   Solid     Solid: Within functional limits     Aliayah Tyer MA, CCC-SLP  Clorinda Wyble Meryl 11/22/2024,3:31 PM

## 2024-11-22 NOTE — Evaluation (Signed)
 Physical Therapy Evaluation Patient Details Name: Andre Stout MRN: [id number] DOB: 27-Aug-1940 Today's Date: 11/22/2024  History of Present Illness  The pt is an 84 yo male presenting 11/22 with weakness and BLE swelling. Admitted for sepsis due to PNA. PMH includes: dementia, AV block, HTN, T2DM, prostate CA, glaucoma, CHF, CVA, HLD  Clinical Impression  Pt in bed upon arrival of PT, agreeable to evaluation at this time. During last admission earlier this month, the pt was ambulating 90 ft with RW and minA, no family was present to discuss mobility or assist needed since last d/c and pt unable to answer questions as he is oriented to self only at this time. Pt also with limited arousal, answering only with max stimulation and cues. Pt presents with limited ROM in R knee (not documented at last admission), limited active ROM and strength in BLE, poor balance, and activity tolerance. Pt needed totalA to complete transition to sitting EOB and min-modA to maintain sitting EOB. Further OOB transfers deferred due to R knee flexion contracture, pt fatigue, lack of assist and soft BP. Will continue to follow acutely, but anticipate more assistance will be needed at d/c as pt currently needing more assist with basic mobility than at last d/c. Recommend continued inpatient therapies <3hours/day.   VITALS:  - supine in bed - BP: 99/30 (52); - supine in bed - BP: 101/28 (48); (RN notified, aware diastolic is low) - supine in bed after LE movements - BP: 117/37 (61); - sitting EOB - BP: 116/44 (63); HR: 62bpm - supine in bed - 117/44 (62);   SpO2 95% on RA    If plan is discharge home, recommend the following: Two people to help with walking and/or transfers;Two people to help with bathing/dressing/bathroom;Assistance with cooking/housework;Assistance with feeding;Direct supervision/assist for medications management;Direct supervision/assist for financial management;Assist for transportation;Help with  stairs or ramp for entrance;Supervision due to cognitive status   Can travel by private vehicle   No    Equipment Recommendations Wheelchair (measurements PT);Wheelchair cushion (measurements PT);Hoyer lift  Recommendations for [employer]       Functional Status Assessment Patient has had a recent decline in their functional status and demonstrates the ability to make significant improvements in function in a reasonable and predictable amount of time.     Precautions / Restrictions Precautions Precautions: Fall Recall of Precautions/Restrictions: Impaired Restrictions Weight Bearing Restrictions Per Provider Order: No      Mobility  Bed Mobility Overal bed mobility: Needs Assistance Bed Mobility: Supine to Sit, Sit to Supine     Supine to sit: Total assist Sit to supine: Mod assist   General bed mobility comments: totalA due to lack of assist from pt to move LE or elevate trunk, modA to return to bed as pt with good initiaion    Transfers                   General transfer comment: deferred due to lack of assist, R knee flexion contracture, and soft BP      Balance Overall balance assessment: Needs assistance Sitting-balance support: No upper extremity supported, Feet supported Sitting balance-Leahy Scale: Poor Sitting balance - Comments: moments of CGA, initially min-modA to maintain sitting EOB Postural control: Posterior lean                                   Pertinent Vitals/Pain Pain Assessment Pain Assessment: No/denies pain  Home Living Family/patient expects to be discharged to:: Private residence Living Arrangements: Children Available Help at Discharge: Family;Available PRN/intermittently Type of Home: House Home Access: Stairs to enter;Ramped entrance   Entrance Stairs-Number of Steps: 5   Home Layout: One level Home Equipment: Agricultural Consultant (2 wheels);Wheelchair - Lawyer Comments: pt unable  to state, info from last admission    Prior Function Prior Level of Function : Patient poor historian/Family not available             Mobility Comments: pt unable to answer, only oriented to self and no family present, walking 90 ft with RW and minA last admission earlier this month ADLs Comments: likley needs help at baseline     Extremity/Trunk Assessment   Upper Extremity Assessment Upper Extremity Assessment: Defer to OT evaluation;Generalized weakness    Lower Extremity Assessment Lower Extremity Assessment: Generalized weakness;RLE deficits/detail (limited movement in LLE to command other than toes when in bed, grossly 3+/5 to MMT when sitting EOB) RLE Deficits / Details: pt able to move toes to command, presents with R knee flexion contracture, can reach 90 deg with PROM from PT, no active knee extension in session. RLE: Unable to fully assess due to pain RLE Coordination: decreased fine motor;decreased gross motor    Cervical / Trunk Assessment Cervical / Trunk Assessment: Kyphotic  Communication   Communication Communication: Impaired Factors Affecting Communication: Hearing impaired    Cognition Arousal: Obtunded Behavior During Therapy: Flat affect   PT - Cognitive impairments: No family/caregiver present to determine baseline, Orientation, Safety/Judgement, Problem solving, Memory   Orientation impairments: Place, Time, Situation                   PT - Cognition Comments: oriented to self only, followed simple commands inconsistently. Following commands: Impaired Following commands impaired: Follows one step commands inconsistently, Follows one step commands with increased time     Cueing Cueing Techniques: Verbal cues, Gestural cues     General Comments General comments (skin integrity, edema, etc.): BP soft with low diastolic, RN aware    Exercises     Assessment/Plan    PT Assessment Patient needs continued PT services  PT Problem List  Decreased strength;Decreased activity tolerance;Decreased balance;Decreased safety awareness;Decreased mobility       PT Treatment Interventions Gait training;Stair training;Functional mobility training;Therapeutic exercise;Therapeutic activities;Patient/family education;Cognitive remediation;Neuromuscular re-education;DME instruction;Balance training    PT Goals (Current goals can be found in the Care Plan section)  Acute Rehab PT Goals Patient Stated Goal: none stated PT Goal Formulation: Patient unable to participate in goal setting Time For Goal Achievement: 12/13/24 Potential to Achieve Goals: Fair    Frequency Min 2X/week        AM-PAC PT 6 Clicks Mobility  Outcome Measure Help needed turning from your back to your side while in a flat bed without using bedrails?: A Lot Help needed moving from lying on your back to sitting on the side of a flat bed without using bedrails?: Total Help needed moving to and from a bed to a chair (including a wheelchair)?: Total Help needed standing up from a chair using your arms (e.g., wheelchair or bedside chair)?: Total Help needed to walk in hospital room?: Total Help needed climbing 3-5 steps with a railing? : Total 6 Click Score: 7    End of Session   Activity Tolerance: Patient limited by fatigue;Patient limited by pain Patient left: in bed;with call bell/phone within reach;with bed alarm set Nurse Communication: Mobility status;Other (comment) (  hypotension) PT Visit Diagnosis: Other abnormalities of gait and mobility (R26.89);Difficulty in walking, not elsewhere classified (R26.2);Muscle weakness (generalized) (M62.81)    Time: 8382-8361 PT Time Calculation (min) (ACUTE ONLY): 21 min   Charges:   PT Evaluation $PT Eval Moderate Complexity: 1 Mod   PT General Charges $$ ACUTE PT VISIT: 1 Visit         Izetta Call, PT, DPT   Acute Rehabilitation Department Office [phone] Secure Chat Communication Preferred  Izetta JULIANNA Call 11/22/2024, 6:25 PM

## 2024-11-23 ENCOUNTER — Inpatient Hospital Stay (HOSPITAL_COMMUNITY)

## 2024-11-23 DIAGNOSIS — A419 Sepsis, unspecified organism: Secondary | ICD-10-CM | POA: Diagnosis not present

## 2024-11-23 DIAGNOSIS — R609 Edema, unspecified: Secondary | ICD-10-CM

## 2024-11-23 DIAGNOSIS — J189 Pneumonia, unspecified organism: Secondary | ICD-10-CM | POA: Diagnosis not present

## 2024-11-23 LAB — BASIC METABOLIC PANEL WITH GFR
Anion gap: 26 — ABNORMAL HIGH (ref 5–15)
Anion gap: 28 — ABNORMAL HIGH (ref 5–15)
Anion gap: 26 — ABNORMAL HIGH (ref 5–15)
Anion gap: 18 — ABNORMAL HIGH (ref 5–15)
Anion gap: 14 (ref 5–15)
Anion gap: 14 (ref 5–15)
BUN: 46 mg/dL — ABNORMAL HIGH (ref 8–23)
BUN: 49 mg/dL — ABNORMAL HIGH (ref 8–23)
BUN: 51 mg/dL — ABNORMAL HIGH (ref 8–23)
BUN: 50 mg/dL — ABNORMAL HIGH (ref 8–23)
BUN: 48 mg/dL — ABNORMAL HIGH (ref 8–23)
BUN: 48 mg/dL — ABNORMAL HIGH (ref 8–23)
CO2: 14 mmol/L — ABNORMAL LOW (ref 22–32)
CO2: 12 mmol/L — ABNORMAL LOW (ref 22–32)
CO2: 14 mmol/L — ABNORMAL LOW (ref 22–32)
CO2: 22 mmol/L (ref 22–32)
CO2: 24 mmol/L (ref 22–32)
CO2: 26 mmol/L (ref 22–32)
Calcium: 9.1 mg/dL (ref 8.9–10.3)
Calcium: 8.8 mg/dL — ABNORMAL LOW (ref 8.9–10.3)
Calcium: 9 mg/dL (ref 8.9–10.3)
Calcium: 9.3 mg/dL (ref 8.9–10.3)
Calcium: 9.4 mg/dL (ref 8.9–10.3)
Calcium: 9.6 mg/dL (ref 8.9–10.3)
Chloride: 84 mmol/L — ABNORMAL LOW (ref 98–111)
Chloride: 86 mmol/L — ABNORMAL LOW (ref 98–111)
Chloride: 85 mmol/L — ABNORMAL LOW (ref 98–111)
Chloride: 91 mmol/L — ABNORMAL LOW (ref 98–111)
Chloride: 96 mmol/L — ABNORMAL LOW (ref 98–111)
Chloride: 96 mmol/L — ABNORMAL LOW (ref 98–111)
Creatinine, Ser: 2.63 mg/dL — ABNORMAL HIGH (ref 0.61–1.24)
Creatinine, Ser: 2.73 mg/dL — ABNORMAL HIGH (ref 0.61–1.24)
Creatinine, Ser: 2.69 mg/dL — ABNORMAL HIGH (ref 0.61–1.24)
Creatinine, Ser: 2.47 mg/dL — ABNORMAL HIGH (ref 0.61–1.24)
Creatinine, Ser: 2.02 mg/dL — ABNORMAL HIGH (ref 0.61–1.24)
Creatinine, Ser: 1.87 mg/dL — ABNORMAL HIGH (ref 0.61–1.24)
GFR, Estimated: 23 mL/min — ABNORMAL LOW (ref >60)
GFR, Estimated: 22 mL/min — ABNORMAL LOW (ref >60)
GFR, Estimated: 23 mL/min — ABNORMAL LOW (ref >60)
GFR, Estimated: 25 mL/min — ABNORMAL LOW (ref >60)
GFR, Estimated: 32 mL/min — ABNORMAL LOW (ref >60)
GFR, Estimated: 35 mL/min — ABNORMAL LOW (ref >60)
Glucose, Bld: 788 mg/dL (ref 70–99)
Glucose, Bld: 757 mg/dL (ref 70–99)
Glucose, Bld: 606 mg/dL (ref 70–99)
Glucose, Bld: 451 mg/dL — ABNORMAL HIGH (ref 70–99)
Glucose, Bld: 110 mg/dL — ABNORMAL HIGH (ref 70–99)
Glucose, Bld: 114 mg/dL — ABNORMAL HIGH (ref 70–99)
Potassium: 4.7 mmol/L (ref 3.5–5.1)
Potassium: 4.8 mmol/L (ref 3.5–5.1)
Potassium: 3.3 mmol/L — ABNORMAL LOW (ref 3.5–5.1)
Potassium: 3.4 mmol/L — ABNORMAL LOW (ref 3.5–5.1)
Potassium: 4 mmol/L (ref 3.5–5.1)
Potassium: 4 mmol/L (ref 3.5–5.1)
Sodium: 124 mmol/L — ABNORMAL LOW (ref 135–145)
Sodium: 126 mmol/L — ABNORMAL LOW (ref 135–145)
Sodium: 125 mmol/L — ABNORMAL LOW (ref 135–145)
Sodium: 131 mmol/L — ABNORMAL LOW (ref 135–145)
Sodium: 134 mmol/L — ABNORMAL LOW (ref 135–145)
Sodium: 136 mmol/L (ref 135–145)

## 2024-11-23 LAB — GLUCOSE, CAPILLARY
Glucose-Capillary: 105 mg/dL — ABNORMAL HIGH (ref 70–99)
Glucose-Capillary: 106 mg/dL — ABNORMAL HIGH (ref 70–99)
Glucose-Capillary: 108 mg/dL — ABNORMAL HIGH (ref 70–99)
Glucose-Capillary: 108 mg/dL — ABNORMAL HIGH (ref 70–99)
Glucose-Capillary: 122 mg/dL — ABNORMAL HIGH (ref 70–99)
Glucose-Capillary: 124 mg/dL — ABNORMAL HIGH (ref 70–99)
Glucose-Capillary: 147 mg/dL — ABNORMAL HIGH (ref 70–99)
Glucose-Capillary: 183 mg/dL — ABNORMAL HIGH (ref 70–99)
Glucose-Capillary: 275 mg/dL — ABNORMAL HIGH (ref 70–99)
Glucose-Capillary: 364 mg/dL — ABNORMAL HIGH (ref 70–99)
Glucose-Capillary: 409 mg/dL — ABNORMAL HIGH (ref 70–99)
Glucose-Capillary: 411 mg/dL — ABNORMAL HIGH (ref 70–99)
Glucose-Capillary: 460 mg/dL — ABNORMAL HIGH (ref 70–99)
Glucose-Capillary: 504 mg/dL (ref 70–99)
Glucose-Capillary: 513 mg/dL (ref 70–99)
Glucose-Capillary: 557 mg/dL (ref 70–99)
Glucose-Capillary: 588 mg/dL (ref 70–99)
Glucose-Capillary: >600 mg/dL (ref 70–99)
Glucose-Capillary: >600 mg/dL (ref 70–99)
Glucose-Capillary: >600 mg/dL (ref 70–99)
Glucose-Capillary: >600 mg/dL (ref 70–99)
Glucose-Capillary: >600 mg/dL (ref 70–99)
Glucose-Capillary: >600 mg/dL (ref 70–99)

## 2024-11-23 LAB — CBC
HCT: 34 % — ABNORMAL LOW (ref 39.0–52.0)
Hemoglobin: 10.5 g/dL — ABNORMAL LOW (ref 13.0–17.0)
MCH: 25.2 pg — ABNORMAL LOW (ref 26.0–34.0)
MCHC: 30.9 g/dL (ref 30.0–36.0)
MCV: 81.7 fL (ref 80.0–100.0)
Platelets: 201 K/uL (ref 150–400)
RBC: 4.16 MIL/uL — ABNORMAL LOW (ref 4.22–5.81)
RDW: 17.6 % — ABNORMAL HIGH (ref 11.5–15.5)
WBC: 12.4 K/uL — ABNORMAL HIGH (ref 4.0–10.5)
nRBC: 0 % (ref 0.0–0.2)

## 2024-11-23 LAB — BETA-HYDROXYBUTYRIC ACID
Beta-Hydroxybutyric Acid: >8 mmol/L — ABNORMAL HIGH (ref 0.05–0.27)
Beta-Hydroxybutyric Acid: 7.43 mmol/L — ABNORMAL HIGH (ref 0.05–0.27)
Beta-Hydroxybutyric Acid: 1.16 mmol/L — ABNORMAL HIGH (ref 0.05–0.27)
Beta-Hydroxybutyric Acid: 0.12 mmol/L (ref 0.05–0.27)
Beta-Hydroxybutyric Acid: 0.26 mmol/L (ref 0.05–0.27)

## 2024-11-23 LAB — HEMOGLOBIN A1C
Hgb A1c MFr Bld: 12.1 % — ABNORMAL HIGH (ref 4.8–5.6)
Mean Plasma Glucose: 301 mg/dL

## 2024-11-23 MED ORDER — INSULIN GLARGINE-YFGN 100 UNIT/ML ~~LOC~~ SOLN
30.0000 [IU] | Freq: Once | SUBCUTANEOUS | Status: AC
  Administered 2024-11-23: 30 [IU] via SUBCUTANEOUS
  Filled 2024-11-23: qty 0.3

## 2024-11-23 MED ORDER — INSULIN GLARGINE-YFGN 100 UNIT/ML ~~LOC~~ SOLN
15.0000 [IU] | Freq: Every day | SUBCUTANEOUS | Status: DC
  Administered 2024-11-23 – 2024-11-25 (×3): 15 [IU] via SUBCUTANEOUS
  Filled 2024-11-23 (×3): qty 0.15

## 2024-11-23 MED ORDER — INSULIN NPH (HUMAN) (ISOPHANE) 100 UNIT/ML ~~LOC~~ SUSP
5.0000 [IU] | Freq: Once | SUBCUTANEOUS | Status: DC
  Filled 2024-11-23: qty 10

## 2024-11-23 MED ORDER — ENOXAPARIN SODIUM 30 MG/0.3ML IJ SOSY
30.0000 mg | PREFILLED_SYRINGE | INTRAMUSCULAR | Status: DC
  Administered 2024-11-23 – 2024-12-07 (×15): 30 mg via SUBCUTANEOUS
  Filled 2024-11-23 (×15): qty 0.3

## 2024-11-23 MED ORDER — PIPERACILLIN-TAZOBACTAM IN DEX 2-0.25 GM/50ML IV SOLN
2.2500 g | Freq: Three times a day (TID) | INTRAVENOUS | Status: DC
  Administered 2024-11-23: 2.25 g via INTRAVENOUS
  Filled 2024-11-23 (×4): qty 50

## 2024-11-23 MED ORDER — PIPERACILLIN-TAZOBACTAM 3.375 G IVPB
3.3750 g | Freq: Three times a day (TID) | INTRAVENOUS | Status: DC
  Administered 2024-11-23 – 2024-11-24 (×2): 3.375 g via INTRAVENOUS
  Filled 2024-11-23 (×3): qty 50

## 2024-11-23 MED ORDER — POTASSIUM CHLORIDE 10 MEQ/100ML IV SOLN
10.0000 meq | INTRAVENOUS | Status: AC
  Administered 2024-11-23 (×4): 10 meq via INTRAVENOUS
  Filled 2024-11-23 (×4): qty 100

## 2024-11-23 MED ORDER — DEXTROSE IN LACTATED RINGERS 5 % IV SOLN: INTRAVENOUS | Status: AC

## 2024-11-23 MED ORDER — INSULIN GLARGINE-YFGN 100 UNIT/ML ~~LOC~~ SOLN
20.0000 [IU] | Freq: Every day | SUBCUTANEOUS | Status: DC
  Filled 2024-11-23: qty 0.2

## 2024-11-23 MED ORDER — IPRATROPIUM-ALBUTEROL 0.5-2.5 (3) MG/3ML IN SOLN: 3.0000 mL | Freq: Four times a day (QID) | RESPIRATORY_TRACT | Status: DC | PRN

## 2024-11-23 MED ORDER — LACTATED RINGERS IV SOLN: INTRAVENOUS | Status: AC

## 2024-11-23 MED ORDER — INSULIN REGULAR(HUMAN) IN NACL 100-0.9 UT/100ML-% IV SOLN
INTRAVENOUS | Status: DC
  Administered 2024-11-23: 7.5 [IU]/h via INTRAVENOUS
  Administered 2024-11-23: 2.2 [IU]/h via INTRAVENOUS
  Filled 2024-11-23: qty 100

## 2024-11-23 MED ORDER — DEXTROSE 50 % IV SOLN: 0.0000 mL | INTRAVENOUS | Status: DC | PRN

## 2024-11-23 MED ORDER — INSULIN ASPART 100 UNIT/ML IJ SOLN: 0.0000 [IU] | Freq: Three times a day (TID) | INTRAMUSCULAR | Status: DC

## 2024-11-23 NOTE — Progress Notes (Signed)
 OT Cancellation Note  Patient Details Name: Andre Stout MRN: [id number] DOB: 09/13/1940   Cancelled Treatment:    Reason Eval/Treat Not Completed: Fatigue/lethargy limiting ability to participate. Pt only opening eyes briefly. Pt only able to say Good morning . Will follow up  Warrick POUR OTR/L  Acute Rehab Services  [phone] office number   [name] 11/23/2024, 9:07 AM

## 2024-11-23 NOTE — Consult Note (Cosign Needed Addendum)
 Palliative Care Consult Note                                  Date: 11/23/2024   Patient Name: Andre Stout  DOB:1940-05-30  FMW:[id number]  Age / Sex:84 y.o., male  PCP: Pcp, No Referring Physician: Fairy Frames, MD  Reason for Consultation: Establishing goals of care  Past Medical History:  Diagnosis Date   CKD (chronic kidney disease) stage 3, GFR 30-59 ml/min (HCC)    Diabetes mellitus without complication (HCC)    Glaucoma    Heart block    complete heart block   History of CVA (cerebrovascular accident)    Hypertension    Prostate cancer (HCC)    Been 3-4 years ago     Assessment & Plan:   HPI/Patient Profile: 84 y.o. male  with past medical history of dementia, T2DM, history of prostate cancer, chronic anemia admitted on 11/21/2024 with pneumonia and acute metabolic encephalopathy.   Patient and family previously met with palliative during prior hospitalization 7/22-7/31 and family expressed that patient was not ready for hospice discussion at that time. Plan was for outpatient palliative following as an outpatient but daughter confirmed that was never set up.   Per H&P on 11/22 by Claudene MD, initial work up significant for elevated BNP 1240 concerning for congestive heart failure exacerbation. CXR showed new right upper lobe consolidation consistent with pneumonia. CT chest showed pulmonary consolidation in right upper lobe.   Palliative medicine consulted for goals of care conversation.   SUMMARY OF RECOMMENDATIONS   DNR-limited Family reiterated that patient is not ready for hospice discussions PMT will continue to follow   Symptom Management:  Per primary team  Code Status: DNR - Limited (DNR/DNI)  Prognosis:  Unable to determine  Discharge Planning:  To Be Determined   Discussed with: Fairy MD about daughter's continued expression of her father not being ready to discuss hospice and that she does  not want palliative to follow as an outpatient to monitor for further deterioration.   Subjective:   Reviewed medical records, received report from team, assessed the patient and then meet at the patient's bedside to discuss diagnosis, prognosis, GOC, EOL wishes disposition and options.  I met with patient without any visitors bedside. Spoke with daughter Lucina Amble) on the phone.   We meet to discuss diagnosis prognosis, GOC, EOL wishes, disposition and options. Concept of Palliative Care was introduced as specialized medical care for people and their families living with serious illness.  If focuses on providing relief from the symptoms and stress of a serious illness.  The goal is to improve quality of life for both the patient and the family. Values and goals of care important to patient and family were attempted to be elicited.  Created space and opportunity for patient  and family to explore thoughts and feelings regarding current medical situation   Natural trajectory and current clinical status were discussed. Questions and concerns addressed. Patient encouraged to call with questions or concerns.    Patient/Family Understanding of Illness: - Patient unable to express the reason for being in the hospital, assessed orientation and patient was able to answer yes/no questions appropriately but unable to answer correctly the reason he was in the hospital - Daughter has a good understanding of her father's current hospitalization, but expressed that she is hopeful that things will continue to improve during this admission  Life  Review: - Information gathered from daughter, patient was married twice, his first marriage was about 12 years and his second marriage was about 15 years and is widowed - Michaelle Amble is his only child - Patient was in the the [employer] for 4 years and [employer] with [employer] - 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 from his time in the the [employer] and  [employer] - Daughter shares that the patient enjoyed Sherren Medin Do and boxing  Baseline Status: - Daughter reports that the patient has been doing well enough outside of these episodes of confusion and weakness - Reports that at home patient is a contact guard assist with a rolling walker - Patient prefers baby food with a good appetite, attributed to poor dentition and prefers soft foods  Today's Discussion: - Discussed with daughter about the patient's overall health over the preceding months prior to current hospitalization, current hospitalization, and health care goals - Daughter shares that patient is expected to be discharged to receive rehab and has been doing well prior to current hospitalization - Inquired about prior palliative consult during 7/22-7/31 admission and daughter shared that even at that time the family and patient were not ready to consider hospice at this time - Educated on the difference between hospice and palliative medicine - Inquired about whether family would be interested in having palliative following as an outpatient to monitor progress and she expressed that she is not interested at this time - Daughter was agreeable to palliative continuing discussions while patient is inpatient - Discussed code status and daughter confirmed that patient should remains DNR - Discussed HCPOA and advanced directives and daughter shared that the patient has documentation and will bring it in when she visits  Review of Systems  Constitutional:  Positive for activity change and fatigue.    Objective:   Primary Diagnoses: Present on Admission:  Sepsis due to pneumonia (HCC)  Acute metabolic encephalopathy  CKD (chronic kidney disease) stage 3, GFR 30-59 ml/min (HCC)  Hyponatremia  Microcytic anemia   Vital Signs:  BP (!) 116/52 (BP Location: Left Arm)   Pulse 60   Temp 98 F (36.7 C) (Oral)   Resp 18   Ht 5' 5 (1.651 m)   Wt 59.4 kg   SpO2 99%   BMI 21.79 kg/m    Physical Exam Constitutional:      Appearance: He is ill-appearing.     Comments: Somnolent but able to answer questions.   HENT:     Head: Normocephalic.     Nose: Nose normal.     Mouth/Throat:     Mouth: Mucous membranes are dry.  Cardiovascular:     Rate and Rhythm: Normal rate.  Pulmonary:     Effort: Pulmonary effort is normal.  Abdominal:     Palpations: Abdomen is soft.  Musculoskeletal:     Right lower leg: No edema.     Left lower leg: No edema.  Skin:    General: Skin is warm.  Neurological:     Mental Status: Mental status is at baseline.    Palliative Assessment/Data: 40%    Thank you for allowing us  to participate in the care of Andre Stout PMT will continue to support holistically.  I personally spent a total of 45 minutes in the care of the patient today including preparing to see the patient, getting/reviewing separately obtained history, performing a medically appropriate exam/evaluation, counseling and educating, referring and communicating with other health care professionals,  and documenting clinical information in the EHR.   Signed by: Fairy FORBES Shan DEVONNA Palliative Medicine Team  Team Phone # [phone] (Nights/Weekends)  11/23/2024, 4:52 PM

## 2024-11-23 NOTE — Progress Notes (Signed)
 SLP Cancellation Note  Patient Details Name: Andre Stout MRN: [id number] DOB: 1940/07/09   Cancelled treatment:       Reason Eval/Treat Not Completed: Medical issues which prohibited therapy. MBS was scheduled for this morning and then rescheduled for the afternoon. Per radiology, nursing shared that pt was not medically able to come down for testing. Will f/u for completion of MBS once medically able to do so.    Leita SAILOR., M.A. CCC-SLP Acute Rehabilitation Services Office: [phone]  Secure chat preferred  11/23/2024, 1:41 PM

## 2024-11-23 NOTE — Progress Notes (Signed)
 PHARMACY NOTE:  ANTIMICROBIAL RENAL DOSAGE ADJUSTMENT  Current antimicrobial regimen includes a mismatch between antimicrobial dosage and estimated renal function.  As per policy approved by the Pharmacy & Therapeutics and Medical Executive Committees, the antimicrobial dosage will be adjusted accordingly.  Current antimicrobial dosage:  Zosyn  3.375 gm IV q8h (4 hour infusion).  Indication: pneumonia  Renal Function:  Estimated Creatinine Clearance: 16.9 mL/min (A) (by C-G formula based on SCr of 2.73 mg/dL (H)). []      On intermittent HD, scheduled: []      On CRRT    Antimicrobial dosage has been changed to:  Zosyn  2.25gm IV q8h  Additional comments: Will also adjust enoxaparin  dosing for CrCl <30.   Thank you for allowing pharmacy to be a part of this patient's care.  Oren Suzen Acre, [facility] 11/23/2024 7:39 AM

## 2024-11-23 NOTE — Evaluation (Signed)
 Occupational Therapy Evaluation Patient Details Name: Andre Stout MRN: [id number] DOB: [birth year] Today's Date: 11/23/2024   History of Present Illness   The pt is an 84 yo male presenting 11/22 with weakness and BLE swelling. Admitted for sepsis due to PNA. PMH includes: dementia, AV block, HTN, T2DM, prostate CA, glaucoma, CHF, CVA, HLD     Clinical Impressions Pt presents with decline in function and safety with ADLs and ADL mobility with impaired strength, balance, endurance and cognition. PTA pt lives with his children and required assist with ADLs and using RW for mobility. Pt currently limited by decreased cognition, decreased activity tolerance. Pt required max A sup-sit, sitting EOB less than 30 seconds and pushing back to return to supine requiring total A with LEs mgt. Pt required min A with UB ADLs at bed level with cues for initiation and sequencing, extensive assist with al other selfcare tasks. OT will follow acutely to maximize level of function and safety      If plan is discharge home, recommend the following:   A lot of help with walking and/or transfers;A lot of help with bathing/dressing/bathroom;Assistance with cooking/housework;Assist for transportation;Help with stairs or ramp for entrance;Supervision due to cognitive status     Functional Status Assessment   Patient has had a recent decline in their functional status and demonstrates the ability to make significant improvements in function in a reasonable and predictable amount of time.     Equipment Recommendations   Other (comment) (defer)     Recommendations for Other Services         Precautions/Restrictions   Precautions Precautions: Fall Recall of Precautions/Restrictions: Impaired Restrictions Weight Bearing Restrictions Per Provider Order: No     Mobility Bed Mobility Overal bed mobility: Needs Assistance Bed Mobility: Rolling, Supine to Sit, Sit to Supine Rolling: Mod  assist   Supine to sit: Max assist Sit to supine: Max assist        Transfers                          Balance Overall balance assessment: Needs assistance Sitting-balance support: No upper extremity supported, Feet supported Sitting balance-Leahy Scale: Poor Sitting balance - Comments: min-modA to maintain sitting EOB                                   ADL either performed or assessed with clinical judgement   ADL Overall ADL's : Needs assistance/impaired     Grooming: Wash/dry hands;Wash/dry face;Supervision/safety;Set up;Bed level   Upper Body Bathing: Minimal assistance;Cueing for sequencing;Cueing for safety;Bed level   Lower Body Bathing: Total assistance;Bed level   Upper Body Dressing : Minimal assistance;Bed level;Cueing for safety;Cueing for sequencing   Lower Body Dressing: Total assistance       Toileting- Clothing Manipulation and Hygiene: Total assistance;Bed level         General ADL Comments: decreased cognition, decreased activity tolerance. Pt sitting EOB less than 30 seconds and pushing back to return to supine     Vision Baseline Vision/History: 1 Wears glasses Ability to See in Adequate Light: 0 Adequate Patient Visual Report: No change from baseline       Perception         Praxis         Pertinent Vitals/Pain Pain Assessment Pain Assessment: No/denies pain     Extremity/Trunk Assessment Upper Extremity Assessment Upper Extremity Assessment:  Generalized weakness;Left hand dominant   Lower Extremity Assessment Lower Extremity Assessment: Defer to PT evaluation       Communication Communication Communication: Impaired Factors Affecting Communication: Hearing impaired   Cognition   Behavior During Therapy: Flat affect                                 Following commands: Impaired Following commands impaired: Follows one step commands inconsistently, Follows one step commands with  increased time     Cueing  General Comments   Cueing Techniques: Verbal cues;Gestural cues      Exercises     Shoulder Instructions      Home Living Family/patient expects to be discharged to:: Private residence Living Arrangements: Children Available Help at Discharge: Family;Available PRN/intermittently Type of Home: House Home Access: Stairs to enter;Ramped entrance Entrance Stairs-Number of Steps: 5   Home Layout: One level     Bathroom Shower/Tub: Producer, Television/film/video: Standard     Home Equipment: Agricultural Consultant (2 wheels);Wheelchair - Sport And Exercise Psychologist Comments: pt unable to state, info from last admission      Prior Functioning/Environment Prior Level of Function : Patient poor historian/Family not available             Mobility Comments: pt unable to answer, only oriented to self and no family present, walking 90 ft with RW and minA last admission earlier this month ADLs Comments: likley needs help at baseline    OT Problem List: Decreased strength;Decreased activity tolerance;Impaired balance (sitting and/or standing);Decreased cognition;Decreased safety awareness   OT Treatment/Interventions: Self-care/ADL training;Therapeutic exercise;Energy conservation;DME and/or AE instruction;Therapeutic activities;Patient/family education;Balance training      OT Goals(Current goals can be found in the care plan section)   Acute Rehab OT Goals Patient Stated Goal: none stated OT Goal Formulation: With patient Time For Goal Achievement: 12/07/24 Potential to Achieve Goals: Good ADL Goals Pt Will Perform Grooming: with min assist;with contact guard assist;sitting Pt Will Perform Upper Body Bathing: with min assist;with contact guard assist;sitting Pt Will Perform Upper Body Dressing: with min assist;with contact guard assist;sitting Pt Will Transfer to Toilet: with max assist;stand pivot transfer   OT Frequency:  Min 2X/week     Co-evaluation              AM-PAC OT 6 Clicks Daily Activity     Outcome Measure Help from another person eating meals?: A Little Help from another person taking care of personal grooming?: A Little Help from another person toileting, which includes using toliet, bedpan, or urinal?: Total Help from another person bathing (including washing, rinsing, drying)?: A Little Help from another person to put on and taking off regular upper body clothing?: Total Help from another person to put on and taking off regular lower body clothing?: A Little 6 Click Score: 14   End of Session Nurse Communication: Mobility status  Activity Tolerance: Patient limited by fatigue Patient left: in bed;with call bell/phone within reach;with bed alarm set  OT Visit Diagnosis: Other abnormalities of gait and mobility (R26.89);Muscle weakness (generalized) (M62.81);Other symptoms and signs involving cognitive function                Time: 1414-1433 OT Time Calculation (min): 19 min Charges:  OT General Charges $OT Visit: 1 Visit OT Evaluation $OT Eval Moderate Complexity: 1 Mod    Jacques Karna Loose 11/23/2024, 3:07 PM

## 2024-11-23 NOTE — Progress Notes (Signed)
 Patient CBG is 108 at 1900. Results entered into ENDOTOOL with a prompt saying to clamp the line. MD orders to give 15U semglee , continue the insulin  gtt and D5LR for 2 hours after semglee  administration then to stop it both infusions. Oncoming RN made aware.

## 2024-11-23 NOTE — Progress Notes (Addendum)
 PROGRESS NOTE    Andre Stout  FMW:[id number] DOB: 02-28-1940 DOA: 11/21/2024 PCP: Pcp, No  84/M chronically ill with Dementia, Type 2 diabetes mellitus, hypertension, chronic anemia, complete heart block, prostate cancer, recently hospitalized 11/2-7 presented with failure to thrive, poor oral intake and diarrhea, noted to be in CHF exacerbation, AKI, hyponatremia and multifocal pneumonia, treated with antibiotics, diuresed, there was discussion on palliative care but not pursued.  Brought to the ED 11/22 with increased weakness and confusion, also history of worsening cough for few days, some dyspnea.  In the ED afebrile, vital stable, CBG over 300, WBC 14.8, lactate 4.9, BNP 1240, troponin 23, creatinine 1.6, chest x-ray noted new right upper lobe consolidation concerning for developing pneumonia, CT chest abdomen pelvis noted large focal areas of pulmonary consolidation right upper lobe and left lower lobe with progression of right upper lobe disease and moderate left and minimal right pleural effusions improved since prior study. - Admitted, started on antibiotics, SLP eval ongoing - 11/24, labs concerning for DKA, CBGs in the 800 range   Subjective: -Poor historian unable to provide any meaningful information this morning, wants to be left alone, irritable  Assessment and Plan:  Severe sepsis secondary to pneumonia Dysphagia Patient presented with reports of increased weakness with reports of increasing cough over the last 2 days.  Found to be tachypneic with white blood cell count elevated at 14.8 and initial lactic acid 4.9.  CT chest abdomen pelvis revealed concern for large focal areas of pulmonary consolidation in the right upper lobe and left lower lobe with progression of the right upper lobe consolidation.  Review of records note previous speech therapy evaluation recommended dysphagia II diet - Continue IV Zosyn  today, aspiration precautions - SLP following, MBS delayed on  account of DKA today - Blood cultures negative thus far -Pulmonary toilet, add DuoNebs, Mucinex   Has significant chronic disease burden, will request palliative care consult, discussed hospice with dtr, she understands poor prognosis, but patient was apparently reluctant to consider  hospice in the past, may need to avoid using that term  DKA Type 2 diabetes mellitus - Insulin  was held on admission as he was hypoglycemic recent admission, now with significant hyperglycemia and DKA, received Glucerna X3 yesterday - Continue insulin  gtt., add glargine,  Dementia -memory and cognitive decline for 3years  Intermittent complete heart block -Followed by cardiology as recent as few weeks ago, patient declined PPM, also felt to be a poor candidate for invasive management   Pleural effusion Chronic combined CHF - Last echo few weeks ago in 11/25 with a EF 35-40% with wall motion abnormality, mildly reduced RV  -EF noted to be depressed at 35% per last echocardiogram on 11/3.  Seen by cards, on account of numerous comorbidities no further invasive evaluation was recommended. - Clinically appears dry at this time, continue IV fluids today with DKA   Acute metabolic encephalopathy Cognitive impairment Patient noted to be acutely altered and unable to recognize family or location.  At baseline reportedly is able to recognize family and location although normally does not know the year.  Possibly to be multifactorial in nature - TSH was normal recently, B12 significantly low, add replacement  -delirium precautions   Hyponatremia Acute on chronic.  Sodium noted to be as low as 127.  During prior hospitalization sodium improved with diuresis. - Continue to monitor sodium levels   Chronic kidney disease stage IIIb Creatinine noted to be 1.55 which appears around patient's most recent discharge. -  Continue to monitor   Microcytic anemia Chronic.  Hemoglobin noted to be 12.2 with low MCV and MCH  similar to prior. - Continue to monitor     DVT prophylaxis: Lovenox  Code Status: DNR Family Communication: No family at bedside, updated daughter Disposition Plan: To be determined  Consultants:    Procedures:   Antimicrobials:    Objective: Vitals:   11/23/24 0546 11/23/24 0719 11/23/24 0756 11/23/24 0809  BP:   (!) 110/40 (!) 104/42  Pulse:   71 68  Resp:   20 19  Temp: 98.8 F (37.1 C)  98.4 F (36.9 C)   TempSrc: Oral  Oral   SpO2:  98% 99% 98%  Weight:      Height:        Intake/Output Summary (Last 24 hours) at 11/23/2024 1017 Last data filed at 11/23/2024 0915 Gross per 24 hour  Intake 679.71 ml  Output 2500 ml  Net -1820.29 ml   Filed Weights   11/21/24 1740 11/22/24 0343 11/23/24 0500  Weight: 60.1 kg 60.4 kg 59.4 kg    Examination:  General exam: Chronically ill, very poor historian, awake alert, oriented to self only, moderate cognitive deficits Respiratory system: Scattered rhonchi, decreased breath sounds at the bases Cardiovascular system: S1 & S2 heard, RRR.  Abd: nondistended, soft and nontender.Normal bowel sounds heard. Central nervous system: Alert and oriented. No focal neurological deficits. Extremities: no edema Skin: No rashes Psychiatry: Flat affect   Data Reviewed:   CBC: Recent Labs  Lab 11/21/24 1229 11/21/24 1247 11/21/24 1248 11/22/24 0153 11/23/24 0230  WBC 14.8*  --   --  21.5* 12.4*  NEUTROABS 11.9*  --   --   --   --   HGB 12.2* 13.6 13.6 10.4* 10.5*  HCT 37.1* 40.0 40.0 31.2* 34.0*  MCV 78.8*  --   --  77.6* 81.7  PLT 242  --   --  189 201   Basic Metabolic Panel: Recent Labs  Lab 11/21/24 1229 11/21/24 1247 11/21/24 1248 11/22/24 0153 11/23/24 0230 11/23/24 0447 11/23/24 0728  NA 127*   < > 126* 126* 124* 126* 125*  K 5.1   < > 5.0 4.9 4.7 4.8 3.3*  CL 90*  --  90* 90* 84* 86* 85*  CO2 24  --   --  20* 14* 12* 14*  GLUCOSE 162*  --  170* 283* 788* 757* 606*  BUN 20  --  22 26* 46* 49* 51*   CREATININE 1.55*  --  1.60* 1.71* 2.63* 2.73* 2.69*  CALCIUM  9.5  --   --  9.0 9.1 8.8* 9.0   < > = values in this interval not displayed.   GFR: Estimated Creatinine Clearance: 17.2 mL/min (A) (by C-G formula based on SCr of 2.69 mg/dL (H)). Liver Function Tests: Recent Labs  Lab 11/21/24 1229  AST 19  ALT 13  ALKPHOS 84  BILITOT 1.3*  PROT 7.7  ALBUMIN  3.4*   Recent Labs  Lab 11/21/24 1229  LIPASE 23   No results for input(s): AMMONIA in the last 168 hours. Coagulation Profile: No results for input(s): INR, PROTIME in the last 168 hours. Cardiac Enzymes: No results for input(s): CKTOTAL, CKMB, CKMBINDEX, TROPONINI in the last 168 hours. BNP (last 3 results) No results for input(s): PROBNP in the last 8760 hours. HbA1C: No results for input(s): HGBA1C in the last 72 hours. CBG: Recent Labs  Lab 11/23/24 0715 11/23/24 0728 11/23/24 0803 11/23/24 0832 11/23/24 0902  GLUCAP >  600* >600* >600* 588* 557*   Lipid Profile: No results for input(s): CHOL, HDL, LDLCALC, TRIG, CHOLHDL, LDLDIRECT in the last 72 hours. Thyroid  Function Tests: No results for input(s): TSH, T4TOTAL, FREET4, T3FREE, THYROIDAB in the last 72 hours. Anemia Panel: No results for input(s): VITAMINB12, FOLATE, FERRITIN, TIBC, IRON, RETICCTPCT in the last 72 hours. Urine analysis:    Component Value Date/Time   COLORURINE YELLOW 11/21/2024 1229   APPEARANCEUR CLEAR 11/21/2024 1229   LABSPEC 1.013 11/21/2024 1229   PHURINE 7.0 11/21/2024 1229   GLUCOSEU >=500 (A) 11/21/2024 1229   HGBUR NEGATIVE 11/21/2024 1229   BILIRUBINUR NEGATIVE 11/21/2024 1229   KETONESUR NEGATIVE 11/21/2024 1229   PROTEINUR 100 (A) 11/21/2024 1229   UROBILINOGEN 1.0 10/04/2009 1109   NITRITE NEGATIVE 11/21/2024 1229   LEUKOCYTESUR NEGATIVE 11/21/2024 1229   Sepsis Labs: @LABRCNTIP (procalcitonin:4,lacticidven:4)  ) Recent Results (from the past 240 hours)   Culture, blood (routine x 2)     Status: None (Preliminary result)   Collection Time: 11/21/24 12:29 PM   Specimen: BLOOD RIGHT ARM  Result Value Ref Range Status   Specimen Description BLOOD RIGHT ARM  Final   Special Requests   Final    BOTTLES DRAWN AEROBIC AND ANAEROBIC Blood Culture results may not be optimal due to an inadequate volume of blood received in culture bottles   Culture   Final    NO GROWTH 2 DAYS Performed at [facility], [address] [place], [place] [address]    Report Status PENDING  Incomplete  Culture, blood (routine x 2)     Status: None (Preliminary result)   Collection Time: 11/21/24 12:29 PM   Specimen: BLOOD RIGHT ARM  Result Value Ref Range Status   Specimen Description BLOOD RIGHT ARM  Final   Special Requests   Final    BOTTLES DRAWN AEROBIC AND ANAEROBIC Blood Culture results may not be optimal due to an inadequate volume of blood received in culture bottles   Culture   Final    NO GROWTH 2 DAYS Performed at [facility], [address], [place], [place] [address]    Report Status PENDING  Incomplete  Resp panel by RT-PCR (RSV, Flu A&B, Covid) Peripheral     Status: None   Collection Time: 11/21/24 12:29 PM   Specimen: Peripheral; Nasal Swab  Result Value Ref Range Status   SARS Coronavirus 2 by RT PCR NEGATIVE NEGATIVE Final   Influenza A by PCR NEGATIVE NEGATIVE Final   Influenza B by PCR NEGATIVE NEGATIVE Final    Comment: (NOTE) The Xpert Xpress SARS-CoV-2/FLU/RSV plus assay is intended as an aid in the diagnosis of influenza from Nasopharyngeal swab specimens and should not be used as a sole basis for treatment. Nasal washings and aspirates are unacceptable for Xpert Xpress SARS-CoV-2/FLU/RSV testing.  Fact Sheet for Patients: bloggercourse.com  Fact Sheet for Healthcare Providers: seriousbroker.it  This test is not yet approved or cleared by the [place]  FDA  and has been authorized for detection and/or diagnosis of SARS-CoV-2 by FDA under an Emergency Use Authorization (EUA). This EUA will remain in effect (meaning this test can be used) for the duration of the COVID-19 declaration under Section 564(b)(1) of the Act, 21 U.S.C. section 360bbb-3(b)(1), unless the authorization is terminated or revoked.     Resp Syncytial Virus by PCR NEGATIVE NEGATIVE Final    Comment: (NOTE) Fact Sheet for Patients: bloggercourse.com  Fact Sheet for Healthcare Providers: seriousbroker.it  This test is not  yet approved or cleared by the [place]  FDA and has been authorized for detection and/or diagnosis of SARS-CoV-2 by FDA under an Emergency Use Authorization (EUA). This EUA will remain in effect (meaning this test can be used) for the duration of the COVID-19 declaration under Section 564(b)(1) of the Act, 21 U.S.C. section 360bbb-3(b)(1), unless the authorization is terminated or revoked.  Performed at [facility], [address] [place], [place] [address]   MRSA Next Gen by PCR, Nasal     Status: None   Collection Time: 11/21/24  5:50 PM   Specimen: Nasal Mucosa; Nasal Swab  Result Value Ref Range Status   MRSA by PCR Next Gen NOT DETECTED NOT DETECTED Final    Comment: (NOTE) The GeneXpert MRSA Assay (FDA approved for NASAL specimens only), is one component of a comprehensive MRSA colonization surveillance program. It is not intended to diagnose MRSA infection nor to guide or monitor treatment for MRSA infections. Test performance is not FDA approved in patients less than 73 years old. Performed at [facility], [address], [place], [place] [address]      Radiology Studies: DG Chest Port 1 View Result Date: 11/21/2024 EXAM: 1 VIEW(S) XRAY OF THE CHEST 11/21/2024 01:17:00 PM COMPARISON: 11/05/2024 CLINICAL HISTORY: sob sob FINDINGS: LUNGS AND PLEURA: Shallow  inspiration. There is a new rounded consolidation demonstrated in the right upper lobe. Given the short time frame of involvement, this is likely a developing area of pneumonia. Follow-up to resolution is recommended. No pleural effusion or pneumothorax. HEART AND MEDIASTINUM: Heart size and pulmonary vascularity are normal. Calcification of the aorta. BONES AND SOFT TISSUES: Degenerative changes in the spine and shoulders. IMPRESSION: 1. New rounded right upper lobe consolidation, most consistent with developing pneumonia. Recommend imaging follow-up to ensure resolution. 2. No pleural effusion or pneumothorax. Electronically signed by: Elsie Gravely MD 11/21/2024 01:27 PM EST RP Workstation: [id number]   CT Head Wo Contrast Result Date: 11/21/2024 EXAM: CT HEAD WITHOUT CONTRAST 11/21/2024 12:57:43 PM TECHNIQUE: CT of the head was performed without the administration of intravenous contrast. Automated exposure control, iterative reconstruction, and/or weight based adjustment of the mA/kV was utilized to reduce the radiation dose to as low as reasonably achievable. COMPARISON: 11/01/2024 CLINICAL HISTORY: Mental status change, unknown cause. FINDINGS: BRAIN AND VENTRICLES: No acute hemorrhage. No evidence of acute infarct. Proportional prominence of ventricles and sulci, consistent with diffuse cerebral parenchymal volume loss. Scattered periventricular white matter hypoattenuation, consistent with mild chronic ischemic microvascular disease. Chronic right cerebellar infarct. No hydrocephalus. No extra-axial collection. No mass effect or midline shift. ORBITS: Bilateral lens replacement. SINUSES: Partial left mastoid air cell effusion. SOFT TISSUES AND SKULL: No acute soft tissue abnormality. No skull fracture. Calcified atherosclerotic plaque in cavernous/supraclinoid ICA and intradural vertebral arteries. IMPRESSION: 1. No acute intracranial abnormality. 2. Stable diffuse cerebral parenchymal volume loss  with mild chronic ischemic microvascular disease. 3. Chronic right cerebellar infarct. Electronically signed by: Lonni Necessary MD 11/21/2024 01:10 PM EST RP Workstation: [id number]   CT CHEST ABDOMEN PELVIS WO CONTRAST Result Date: 11/21/2024 EXAM: CT CHEST, ABDOMEN AND PELVIS WITHOUT CONTRAST 11/21/2024 12:57:43 PM TECHNIQUE: CT of the chest, abdomen and pelvis was performed without the administration of intravenous contrast. Multiplanar reformatted images are provided for review. Automated exposure control, iterative reconstruction, and/or weight based adjustment of the mA/kV was utilized to reduce the radiation dose to as low as reasonably achievable. Examination is technically limited due to nonstandard patient positioning. Motion artifact limits the  examination. COMPARISON: 11/01/2024 CLINICAL HISTORY: Sepsis. FINDINGS: CHEST: MEDIASTINUM AND LYMPH NODES: Heart and pericardium are unremarkable. The central airways are clear. No mediastinal, hilar or axillary lymphadenopathy. LUNGS AND PLEURA: Large focal areas of pulmonary consolidation are demonstrated in the right upper lobe and in the left lower lobe. Right upper lobe consolidation is progressing since the previous study. Basilar congestion is improved. Moderate left and minimal right pleural effusions are also improved since the prior study. No pneumothorax. ABDOMEN AND PELVIS: LIVER: Unenhanced appearance of the liver is normal. GALLBLADDER AND BILE DUCTS: Unenhanced appearance of the gallbladder is normal. No biliary ductal dilatation. SPLEEN: Unenhanced appearance of the spleen is normal. PANCREAS: Unenhanced appearance of the pancreas is normal. ADRENAL GLANDS: Unenhanced appearance of the adrenal glands is normal. KIDNEYS, URETERS AND BLADDER: Large right renal cyst measuring 10.4 cm diameter. 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 No change since prior study. This is likely benign and no imaging follow-up is  indicated. No stones in the kidneys or ureters. No hydronephrosis. No perinephric or periureteral stranding. The bladder is decompressed, but the bladder wall appears diffusely thickened, possibly due to outlet obstruction or cystitis. Correlate with urinalysis. GI AND BOWEL: Stomach demonstrates no acute abnormality. There is no bowel obstruction. REPRODUCTIVE ORGANS: Prostate seed implants. PERITONEUM AND RETROPERITONEUM: No ascites. No free air. VASCULATURE: Aorta is normal in caliber. Calcification of the aorta. No aneurysm. ABDOMINAL AND PELVIS LYMPH NODES: No significant lymphadenopathy. BONES AND SOFT TISSUES: Degenerative changes in the spine. Compression of the L1 vertebra with vertebra plana. No change. No focal bone lesions. No acute osseous abnormality. No focal soft tissue abnormality. IMPRESSION: 1. Large focal areas of pulmonary consolidation in the right upper lobe and left lower lobe, with progression of the right upper lobe consolidation since the previous study. Basilar congestion is improved. Changes could represent pneumonia or aspiration. 2. Moderate left and minimal right pleural effusions, improved since the prior study. 3. Bladder wall thickening, possibly due to outlet obstruction or cystitis. Recommend correlation with urinalysis. 4. 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 Stable and no imaging follow-up is indicated. Electronically signed by: Elsie Gravely MD 11/21/2024 01:05 PM EST RP Workstation: [id number]     Scheduled Meds:  vitamin B-12  1,000 mcg Oral Daily   enoxaparin  (LOVENOX ) injection  30 mg Subcutaneous Q24H   ipratropium-albuterol   3 mL Nebulization TID   sodium chloride  flush  3 mL Intravenous Q12H   Continuous Infusions:  dextrose  5% lactated ringers      insulin  8.5 Units/hr (11/23/24 0936)   lactated ringers  75 mL/hr at 11/23/24 9192   piperacillin -tazobactam (ZOSYN )  IV     potassium  chloride       LOS: 2 days    Time spent:    Sigurd Pac, MD [facility]   11/23/2024, 10:17 AM

## 2024-11-23 NOTE — TOC Initial Note (Addendum)
 Transition of Care [facility]) - Initial/Assessment Note    Patient Details  Name: Andre Stout MRN: [id number] Date of Birth: Apr 12, 1940  Transition of Care [facility]) CM/SW Contact:    [name], LCSWA Phone Number: 11/23/2024, 10:49 AM  Clinical Narrative:  Patient currently oriented x2 today. CSW followed up on SNF rec with Michaelle Amble (Daughter) at [phone]. Michaelle Amble is agreeable to SNF for her father at this time. Patient previously at [facility], Michaelle Amble gave CSW to fax patient out to other SNFs as well. Michaelle Amble provided CSW her email to send bed offers - [email or web address].   3:10 PM CSW emailed Michaelle Amble the list of short term rehab facilities and their medicare.gov ratings.   CSW will continue to follow.     Expected Discharge Plan: Skilled Nursing Facility Barriers to Discharge: Continued Medical Work up, SNF Pending bed offer, Insurance Authorization   Patient Goals and CMS Choice Patient states their goals for this hospitalization and ongoing recovery are:: To go to rehab CMS Medicare.gov Compare Post Acute Care list provided to:: Other (Comment Required) (Family) Choice offered to / list presented to : Adult Children [place] ownership interest in [facility].provided to:: Adult Children    Expected Discharge Plan and Services In-house Referral: Clinical Social Work   Post Acute Care Choice: Skilled Nursing Facility Living arrangements for the past 2 months: Single Family Home                 DME Arranged: N/A DME Agency: NA       HH Arranged: NA HH Agency: NA        Prior Living Arrangements/Services Living arrangements for the past 2 months: Single Family Home Lives with:: Adult Children, Relatives Patient language and need for interpreter reviewed:: Yes Do you feel safe going back to the place where you live?: Yes      Need for Family Participation in Patient Care: Yes (Comment) Care giver support system in place?: Yes  (comment) Current home services: DME (has walker, shower chair and bsc) Criminal Activity/Legal Involvement Pertinent to Current Situation/Hospitalization: No - Comment as needed  Activities of Daily Living   ADL Screening (condition at time of admission) Does the patient have difficulty concentrating, remembering, or making decisions?: Yes  Permission Sought/Granted Permission sought to share information with : Facility Medical Sales Representative, Family Supports Permission granted to share information with : No (Family contact on chart, pt not oriented)  Share Information with NAME: Michaelle Amble  Permission granted to share info w AGENCY: SNFs  Permission granted to share info w Relationship: daughter  Permission granted to share info w Contact Information: [phone]  Emotional Assessment Appearance:: Appears stated age Attitude/Demeanor/Rapport: Unable to Assess Affect (typically observed): Unable to Assess Orientation: : Oriented to Self, Oriented to Place Alcohol / Substance Use: Not Applicable Psych Involvement: No (comment)  Admission diagnosis:  Hyponatremia [E87.1] Altered mental status, unspecified altered mental status type [R41.82] Multifocal pneumonia [J18.8] Aspiration pneumonia, unspecified aspiration pneumonia type, unspecified laterality, unspecified part of lung (HCC) [J69.0] Patient Active Problem List   Diagnosis Date Noted   Multifocal pneumonia 11/21/2024   Cognitive impairment 11/21/2024   Non-ST elevation (NSTEMI) myocardial infarction (HCC) 11/02/2024   CHF (congestive heart failure) (HCC) 11/02/2024   ARF (acute renal failure) 11/01/2024   Elevated troponin 11/01/2024   Diarrhea 11/01/2024   Uncontrolled type 2 diabetes mellitus with hyperglycemia (HCC) 11/01/2024   Pleural effusion due to CHF (congestive heart failure) (HCC) 11/01/2024  Pneumonia 09/02/2024   Nausea and vomiting 09/02/2024   Uncontrolled diabetes mellitus with hypoglycemia (HCC)  09/02/2024   Acute metabolic encephalopathy 09/02/2024   Hyperkalemia 09/02/2024   Microcytic anemia 09/02/2024   Moderate malnutrition 07/25/2024   L1 vertebral fracture (HCC) 07/22/2024   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   Sepsis due to pneumonia (HCC) 07/21/2024   Aspiration pneumonia (HCC) 07/21/2024   Acute respiratory failure with hypoxia (HCC) 07/21/2024   CKD (chronic kidney disease) stage 3, GFR 30-59 ml/min (HCC) 07/21/2024   Subdural hematoma (HCC) 07/21/2024   Irritant contact dermatitis associated with stoma 03/13/2024   Persistent postoperative fistula 03/13/2024   GERD without esophagitis 03/05/2024   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   History of complete heart block 03/05/2024   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   Malnutrition of moderate degree 01/20/2024   Hyperlipidemia 01/17/2024   Leukocytosis 01/17/2024   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 (HCC) 01/17/2024   Acute encephalopathy 01/17/2024   fracture of distal diaphyseal metaphyseal junction of the 5th left metacarpal with dorsal angulation 01/17/2024   Cholecystostomy care (HCC) 01/17/2024   Migration of percutaneous cholecystostomy tube 08/18/2023   Hypertension associated with diabetes (HCC) 08/18/2023   History of stroke 08/18/2023   Mixed diabetic hyperlipidemia associated with type 2 diabetes mellitus (HCC) 08/18/2023   TIA (transient ischemic attack) 10/15/2022   Odynophagia 08/21/2022   Hyponatremia 08/20/2022   Obesity (BMI 30-39.9) 08/20/2022   Myocardial injury 08/20/2022   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   Seizure (HCC) 01/02/2022   Prolonged QT interval 01/02/2022   Syncope and collapse 10/09/2020   Prostate cancer (HCC) 03/25/2019   Hypophosphatemia 03/06/2016   Low magnesium  level 03/06/2016   Benign essential HTN 03/05/2016   AKI (acute kidney injury) 03/04/2016   PCP:  Pcp, No Pharmacy:   [facility] [address] [place] [address] Phone: [phone] Fax: [phone]  [id number] [place], [place] - [address] AT [facility] [address]  [place] [address] Phone: [phone] Fax: [phone]  [id number] [place], [place] - [address] AT [facility] OF [address] [place] [address] Phone: [phone] Fax: [phone]  [id number] [place], [place] - [address] AT [facility] [address] [place] [address] Phone: [phone] Fax: [phone]     Social Drivers of Health (SDOH) Social History: SDOH Screenings   Food Insecurity: No Food Insecurity (11/03/2024)  Housing: Low Risk  (11/03/2024)  Transportation Needs: Patient Unable To Answer (11/03/2024)  Utilities: Patient Unable To Answer (11/03/2024)  Alcohol Screen: Low Risk  (07/24/2023)  Depression (PHQ2-9): Low Risk  (07/24/2023)  Financial Resource Strain: Low Risk  (10/26/2024)  Physical Activity: Inactive (10/26/2024)  Social Connections: Unknown (11/03/2024)  Recent Concern: Social Connections - Socially Isolated (10/26/2024)  Stress: No Stress Concern Present (10/26/2024)  Tobacco Use: Medium Risk (11/21/2024)  Health Literacy: Adequate Health Literacy (07/24/2023)   SDOH Interventions:     Readmission Risk Interventions    11/06/2024   10:49 AM 08/15/2022    9:25 AM  Readmission Risk Prevention Plan  Transportation Screening Complete Complete  PCP or Specialist Appt within 5-7 Days  Complete  Home Care Screening  Complete  Medication Review (RN CM)  Complete  Medication Review (RN Care Manager) Complete   PCP or Specialist appointment within 3-5 days of discharge Complete  HRI or Home Care Consult Complete   Palliative Care Screening Not Applicable   Skilled Nursing Facility Not Applicable

## 2024-11-23 NOTE — Progress Notes (Signed)
 Heart Failure Navigator Progress Note  Assessed for Heart & Vascular TOC clinic readiness.  Patient does not meet criteria due to per MD note patient with history of Dementia. No HF TOC. .   Navigator will sign off at this time.   Randie Bustle, BSN, Scientist, clinical (histocompatibility and immunogenetics) Only

## 2024-11-23 NOTE — Progress Notes (Signed)
 Bilateral lower extremity venous duplex has been completed. Preliminary results can be found in CV Proc through chart review.   11/23/24 10:11 AM Cathlyn Collet RVT

## 2024-11-23 NOTE — NC FL2 (Signed)
 [place]  MEDICAID FL2 LEVEL OF CARE FORM     IDENTIFICATION  Patient Name: Andre Stout Birthdate: 15-Jul-1940 Sex: male Admission Date (Current Location): 11/21/2024  [facility] and [place] Number:  Producer, Television/film/video and Address:  [facility] [place]. [facility], [address] [place], [place] [address]      Provider Number: [id number]  Attending Physician Name and Address:  Fairy Frames, MD  Relative Name and Phone Number:  Dorlene Michaelle Amble (Daughter)  [phone]    Current Level of Care: Hospital Recommended Level of Care: Skilled Nursing Facility Prior Approval Number:    Date Approved/Denied:   PASRR Number: [id number]  Discharge Plan: SNF    Current Diagnoses: Patient Active Problem List   Diagnosis Date Noted   Multifocal pneumonia 11/21/2024   Cognitive impairment 11/21/2024   Non-ST elevation (NSTEMI) myocardial infarction [facility]) 11/02/2024   CHF (congestive heart failure) (HCC) 11/02/2024   ARF (acute renal failure) 11/01/2024   Elevated troponin 11/01/2024   Diarrhea 11/01/2024   Uncontrolled type 2 diabetes mellitus with hyperglycemia (HCC) 11/01/2024   Pleural effusion due to CHF (congestive heart failure) (HCC) 11/01/2024   Pneumonia 09/02/2024   Nausea and vomiting 09/02/2024   Uncontrolled diabetes mellitus with hypoglycemia (HCC) 09/02/2024   Acute metabolic encephalopathy 09/02/2024   Hyperkalemia 09/02/2024   Microcytic anemia 09/02/2024   Moderate malnutrition 07/25/2024   L1 vertebral fracture (HCC) 07/22/2024   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   Sepsis due to pneumonia (HCC) 07/21/2024   Aspiration pneumonia (HCC) 07/21/2024   Acute respiratory failure with hypoxia (HCC) 07/21/2024   CKD (chronic kidney disease) stage 3, GFR 30-59 ml/min (HCC) 07/21/2024   Subdural hematoma (HCC) 07/21/2024   Irritant contact dermatitis associated with stoma 03/13/2024   Persistent postoperative fistula 03/13/2024   GERD without esophagitis  03/05/2024   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   History of complete heart block 03/05/2024   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   Malnutrition of moderate degree 01/20/2024   Hyperlipidemia 01/17/2024   Leukocytosis 01/17/2024   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 (HCC) 01/17/2024   Acute encephalopathy 01/17/2024   fracture of distal diaphyseal metaphyseal junction of the 5th left metacarpal with dorsal angulation 01/17/2024   Cholecystostomy care (HCC) 01/17/2024   Migration of percutaneous cholecystostomy tube 08/18/2023   Hypertension associated with diabetes (HCC) 08/18/2023   History of stroke 08/18/2023   Mixed diabetic hyperlipidemia associated with type 2 diabetes mellitus (HCC) 08/18/2023   TIA (transient ischemic attack) 10/15/2022   Odynophagia 08/21/2022   Hyponatremia 08/20/2022   Obesity (BMI 30-39.9) 08/20/2022   Myocardial injury 08/20/2022   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   Seizure (HCC) 01/02/2022   Prolonged QT interval 01/02/2022   Syncope and collapse 10/09/2020   Prostate cancer (HCC) 03/25/2019   Hypophosphatemia 03/06/2016   Low magnesium  level 03/06/2016   Benign essential HTN 03/05/2016   AKI (acute kidney injury) 03/04/2016    Orientation RESPIRATION BLADDER Height & Weight     Self, Place  Normal Incontinent, External catheter Weight: 130 lb 15.3 oz (59.4 kg) Height:  5' 5 (165.1 cm)  BEHAVIORAL SYMPTOMS/MOOD NEUROLOGICAL BOWEL NUTRITION STATUS      Continent Diet (Please see discharge summary)  AMBULATORY STATUS COMMUNICATION OF NEEDS Skin   Extensive Assist Verbally Normal                       Personal Care Assistance Level of Assistance  Bathing, Feeding, Dressing  Bathing Assistance:  (Please see discharge summary) Feeding assistance:  (Please see discharge summary) Dressing Assistance:  (Please see discharge summary)     Functional Limitations Info  Sight, Hearing,  Speech Sight Info: Adequate Hearing Info: Adequate Speech Info: Impaired (Hoarse)    SPECIAL CARE FACTORS FREQUENCY  PT (By licensed PT), OT (By licensed OT)     PT Frequency: 5x week OT Frequency: 5x week            Contractures Contractures Info: Not present    Additional Factors Info  Code Status, Allergies Code Status Info: DNR limtied Allergies Info: NKA           Current Medications (11/23/2024):  This is the current hospital active medication list Current Facility-Administered Medications  Medication Dose Route Frequency Provider Last Rate Last Admin   acetaminophen  (TYLENOL ) tablet 650 mg  650 mg Oral Q6H PRN Smith, Rondell A, MD       Or   acetaminophen  (TYLENOL ) suppository 650 mg  650 mg Rectal Q6H PRN Smith, Rondell A, MD       albuterol  (PROVENTIL ) (2.5 MG/3ML) 0.083% nebulizer solution 2.5 mg  2.5 mg Nebulization Q2H PRN Smith, Rondell A, MD       cyanocobalamin  (VITAMIN B12) tablet 1,000 mcg  1,000 mcg Oral Daily Fairy Frames, MD       dextrose  5 % in lactated ringers  infusion   Intravenous Continuous Franky Redia SAILOR, MD       dextrose  50 % solution 0-50 mL  0-50 mL Intravenous PRN Franky Redia SAILOR, MD       enoxaparin  (LOVENOX ) injection 30 mg  30 mg Subcutaneous Q24H Hammons, Kimberly B, RPH       insulin  regular, human (MYXREDLIN ) 100 units/ 100 mL infusion   Intravenous Continuous Franky Redia SAILOR, MD 8.5 mL/hr at 11/23/24 0936 8.5 Units/hr at 11/23/24 0936   ipratropium-albuterol  (DUONEB) 0.5-2.5 (3) MG/3ML nebulizer solution 3 mL  3 mL Nebulization TID Fairy Frames, MD   3 mL at 11/23/24 9280   lactated ringers  infusion   Intravenous Continuous Franky Redia SAILOR, MD 75 mL/hr at 11/23/24 9192 New Bag at 11/23/24 0807   ondansetron  (ZOFRAN ) tablet 4 mg  4 mg Oral Q6H PRN Smith, Rondell A, MD       Or   ondansetron  (ZOFRAN ) injection 4 mg  4 mg Intravenous Q6H PRN Smith, Rondell A, MD       piperacillin -tazobactam (ZOSYN ) IVPB 2.25 g   2.25 g Intravenous Q8H Hammons, Suzen NOVAK, RPH       potassium chloride  10 mEq in 100 mL IVPB  10 mEq Intravenous Q1 Hr x 4 Fairy Frames, MD 100 mL/hr at 11/23/24 1033 10 mEq at 11/23/24 1033   sodium chloride  flush (NS) 0.9 % injection 3 mL  3 mL Intravenous Q12H Claudene Reeves A, MD   3 mL at 11/23/24 9186     Discharge Medications: Please see discharge summary for a list of discharge medications.  Relevant Imaging Results:  Relevant Lab Results:   Additional Information SSN [id number]  [name], LCSWA

## 2024-11-24 ENCOUNTER — Ambulatory Visit: Admitting: Student

## 2024-11-24 ENCOUNTER — Inpatient Hospital Stay (HOSPITAL_COMMUNITY)

## 2024-11-24 DIAGNOSIS — Z515 Encounter for palliative care: Secondary | ICD-10-CM

## 2024-11-24 DIAGNOSIS — J189 Pneumonia, unspecified organism: Secondary | ICD-10-CM | POA: Diagnosis not present

## 2024-11-24 DIAGNOSIS — Z7189 Other specified counseling: Secondary | ICD-10-CM | POA: Diagnosis not present

## 2024-11-24 DIAGNOSIS — R4189 Other symptoms and signs involving cognitive functions and awareness: Secondary | ICD-10-CM | POA: Diagnosis not present

## 2024-11-24 LAB — BASIC METABOLIC PANEL WITH GFR
Anion gap: 13 (ref 5–15)
BUN: 46 mg/dL — ABNORMAL HIGH (ref 8–23)
CO2: 25 mmol/L (ref 22–32)
Calcium: 9.3 mg/dL (ref 8.9–10.3)
Chloride: 99 mmol/L (ref 98–111)
Creatinine, Ser: 1.85 mg/dL — ABNORMAL HIGH (ref 0.61–1.24)
GFR, Estimated: 35 mL/min — ABNORMAL LOW (ref >60)
Glucose, Bld: 123 mg/dL — ABNORMAL HIGH (ref 70–99)
Potassium: 4.1 mmol/L (ref 3.5–5.1)
Sodium: 137 mmol/L (ref 135–145)

## 2024-11-24 LAB — GLUCOSE, CAPILLARY
Glucose-Capillary: 136 mg/dL — ABNORMAL HIGH (ref 70–99)
Glucose-Capillary: 132 mg/dL — ABNORMAL HIGH (ref 70–99)
Glucose-Capillary: 174 mg/dL — ABNORMAL HIGH (ref 70–99)
Glucose-Capillary: 197 mg/dL — ABNORMAL HIGH (ref 70–99)
Glucose-Capillary: 133 mg/dL — ABNORMAL HIGH (ref 70–99)

## 2024-11-24 LAB — CBC
HCT: 32.8 % — ABNORMAL LOW (ref 39.0–52.0)
Hemoglobin: 10.8 g/dL — ABNORMAL LOW (ref 13.0–17.0)
MCH: 25.3 pg — ABNORMAL LOW (ref 26.0–34.0)
MCHC: 32.9 g/dL (ref 30.0–36.0)
MCV: 76.8 fL — ABNORMAL LOW (ref 80.0–100.0)
Platelets: 234 K/uL (ref 150–400)
RBC: 4.27 MIL/uL (ref 4.22–5.81)
RDW: 17.2 % — ABNORMAL HIGH (ref 11.5–15.5)
WBC: 11.1 K/uL — ABNORMAL HIGH (ref 4.0–10.5)
nRBC: 0 % (ref 0.0–0.2)

## 2024-11-24 MED ORDER — AMOXICILLIN-POT CLAVULANATE 500-125 MG PO TABS
1.0000 | ORAL_TABLET | Freq: Two times a day (BID) | ORAL | Status: DC
  Administered 2024-11-24 – 2024-11-29 (×9): 1 via ORAL
  Filled 2024-11-24 (×13): qty 1

## 2024-11-24 MED ORDER — AMOXICILLIN-POT CLAVULANATE 875-125 MG PO TABS: 1.0000 | ORAL_TABLET | Freq: Two times a day (BID) | ORAL | Status: DC

## 2024-11-24 MED ORDER — ENSURE PLUS HIGH PROTEIN PO LIQD
237.0000 mL | Freq: Two times a day (BID) | ORAL | Status: DC
  Administered 2024-11-24 – 2024-11-25 (×3): 237 mL via ORAL

## 2024-11-24 MED ORDER — INSULIN ASPART 100 UNIT/ML IJ SOLN
0.0000 [IU] | Freq: Three times a day (TID) | INTRAMUSCULAR | Status: DC
  Administered 2024-11-24 (×2): 3 [IU] via SUBCUTANEOUS
  Administered 2024-11-25: 5 [IU] via SUBCUTANEOUS
  Administered 2024-11-25: 8 [IU] via SUBCUTANEOUS
  Administered 2024-11-25: 5 [IU] via SUBCUTANEOUS
  Administered 2024-11-26: 2 [IU] via SUBCUTANEOUS
  Administered 2024-11-26: 11 [IU] via SUBCUTANEOUS
  Administered 2024-11-26: 3 [IU] via SUBCUTANEOUS
  Administered 2024-11-27: 5 [IU] via SUBCUTANEOUS
  Administered 2024-11-28 (×2): 2 [IU] via SUBCUTANEOUS
  Administered 2024-11-28: 11 [IU] via SUBCUTANEOUS
  Administered 2024-11-29 (×2): 2 [IU] via SUBCUTANEOUS
  Administered 2024-11-29: 8 [IU] via SUBCUTANEOUS
  Administered 2024-11-30: 11 [IU] via SUBCUTANEOUS
  Administered 2024-11-30: 3 [IU] via SUBCUTANEOUS
  Administered 2024-11-30: 5 [IU] via SUBCUTANEOUS
  Administered 2024-12-01: 15 [IU] via SUBCUTANEOUS
  Filled 2024-11-24: qty 2
  Filled 2024-11-24: qty 11
  Filled 2024-11-24: qty 2
  Filled 2024-11-24: qty 8
  Filled 2024-11-24: qty 3
  Filled 2024-11-24: qty 1
  Filled 2024-11-24: qty 3
  Filled 2024-11-24 (×6): qty 1
  Filled 2024-11-24: qty 11
  Filled 2024-11-24: qty 1
  Filled 2024-11-24: qty 8
  Filled 2024-11-24 (×2): qty 1

## 2024-11-24 NOTE — Inpatient Diabetes Management (Signed)
 Inpatient Diabetes Program Recommendations  AACE/ADA: New Consensus Statement on Inpatient Glycemic Control (2015)  Target Ranges:  Prepandial:   less than 140 mg/dL      Peak postprandial:   less than 180 mg/dL (1-2 hours)      Critically ill patients:  140 - 180 mg/dL   Lab Results  Component Value Date   GLUCAP 132 (H) 11/24/2024   HGBA1C 12.1 (H) 11/23/2024    Review of Glycemic Control  Diabetes history: DM2 Outpatient Diabetes medications: Lantus  15 units every day, Novolog  5-7 units TID Current orders for Inpatient glycemic control: Semglee  15 QD  Inpatient Diabetes Program Recommendations:    Please consider ordering correction:  Novolog  0-6 units TID  Thank you, Wyvonna Pinal, MSN, CDCES Diabetes Coordinator Inpatient Diabetes Program [phone] (team pager from 8a-5p)

## 2024-11-24 NOTE — Procedures (Signed)
 Modified Barium Swallow Study  Patient Details  Name: Andre Stout MRN: [id number] Date of Birth: 1940/05/31  Today's Date: 11/24/2024  Modified Barium Swallow completed.  Full report located under Chart Review in the Imaging Section.  History of Present Illness Andre Stout is an 84 y/o, chronically ill male. Recently hospitalized 11/2-7 due to failure to thrive, poor oral intake and diarrhea, noted to be in CHF exacerbation, AKI, hyponatremia and multifocal pneumonia, treated with antibiotics, diuresed, there was discussion on palliative care but not pursued.  Brought to the ED 11/22 with increased weakness and confusion, also history of worsening cough for few days, some dyspnea.  Chest x-ray noted new right upper lobe consolidation concerning for developing pneumonia, CT chest abdomen pelvis noted large focal areas of pulmonary consolidation right upper lobe and left lower lobe with progression of right upper lobe disease and moderate left and minimal right pleural effusions improved since prior study. Patient most recently seen by SLP 08/2024 during which he was placed on a dysphagia 1, thin liquid diet. Perviously seen in July 2025 with a cognitively based dysphagia.  PMH of  type 2 diabetes mellitus, dementia,hypertension, chronic anemia, complete heart block, prostate cancer.   Clinical Impression Patient presents with a mild oropharyngeal phase dysphagia per this MBS. No aspiration or penetration was observed with any of the tested consistencies. Patient required minimal cues at times to hold attention to bolus. Oral impairments include escape of bolus progressing to mid-chin, disorganized chewing/mashing, repetitive tongue motion, oral residue lining his tongue, and delayed swallow initiation to the valleculae. Pharyngeal impairments include partial anterior movement and collection of pharyngeal residue on valleculae. Patient's swallow function looks similar to his previous MBS on 09/03/24  which also revealed an oropharyngeal dysphagia. SLP recommending Dys 1 (puree) diet with thin liquids. SLP will continue to follow for diet toleration.    Factors that may increase risk of adverse event in presence of aspiration [name] & [name] 2021):    DIGEST Swallow Severity Rating*  Safety:0   Efficiency: 1  Overall Pharyngeal Swallow Severity: 1 (mild) 1: mild; 2: moderate; 3: severe; 4: profound  *The Dynamic Imaging Grade of Swallowing Toxicity is standardized for the head and neck cancer population, however, demonstrates promising clinical applications across populations to standardize the clinical rating of pharyngeal swallow safety and severity.  Swallow Evaluation Recommendations Recommendations: PO diet PO Diet Recommendation: Dysphagia 1 (Pureed);Thin liquids (Level 0) Liquid Administration via: Cup Medication Administration: Crushed with puree Swallowing strategies  : Slow rate;Small bites/sips;Minimize environmental distractions Postural changes: Position pt fully upright for meals;Stay upright 30-60 min after meals Oral care recommendations: Oral care BID (2x/day)    Damien Hy  Graduate SLP Clinican

## 2024-11-24 NOTE — Progress Notes (Addendum)
 PROGRESS NOTE    Andre Stout  FMW:[id number] DOB: December 21, 1940 DOA: 11/21/2024 PCP: Pcp, No  84/M chronically ill with Dementia, Type 2 diabetes mellitus, hypertension, chronic anemia, complete heart block, prostate cancer, recently hospitalized 11/2-7 presented with failure to thrive, poor oral intake and diarrhea, noted to be in CHF exacerbation, AKI, hyponatremia and multifocal pneumonia, treated with antibiotics, diuresed, there was discussion on palliative care but not pursued.  Brought to the ED 11/22 with increased weakness and confusion, also history of worsening cough for few days, some dyspnea.  In the ED afebrile, vital stable, CBG over 300, WBC 14.8, lactate 4.9, BNP 1240, troponin 23, creatinine 1.6, chest x-ray noted new right upper lobe consolidation concerning for developing pneumonia, CT chest abdomen pelvis noted large focal areas of pulmonary consolidation right upper lobe and left lower lobe with progression of right upper lobe disease and moderate left and minimal right pleural effusions improved since prior study. - Admitted, started on antibiotics, SLP eval ongoing - 11/24, labs concerning for DKA, CBGs in the 800 range - Palliative consult, remains DNR, declines hospice services   Subjective: - Blood sugars improved, feels fair, denies any new complaints, does not engage or interact much  Assessment and Plan:  Severe sepsis secondary to pneumonia Dysphagia Patient presented with reports of increased weakness with reports of increasing cough over the last 2 days.  Found to be tachypneic with white blood cell count elevated at 14.8 and initial lactic acid 4.9.  CT chest abdomen pelvis revealed concern for large focal areas of pulmonary consolidation in the right upper lobe and left lower lobe with progression of the right upper lobe consolidation.  Review of records note previous speech therapy evaluation recommended dysphagia II diet - Did with IV Zosyn , changed to  Augmentin  today - SLP following, MBS delayed on account of DKA yesterday - Blood cultures negative thus far -Pulmonary toilet, add DuoNebs, Mucinex   Has significant chronic disease burden, will request palliative care consult, discussed hospice with dtr, she understands poor prognosis, but patient was apparently reluctant to consider  hospice in the past, may need to avoid using that term, palliative following  DKA Type 2 diabetes mellitus - Insulin  was held on admission as he was hypoglycemic recent admission, developed significant hyperglycemia and DKA yesterday was getting excessive amounts of Glucerna - Now off insulin  gtt., continue glargine, add SSI - Check HbA1c with a.m. labs  Dementia -memory and cognitive decline for 3years  Intermittent complete heart block -Followed by cardiology as recent as few weeks ago, patient declined PPM, also felt to be a poor candidate for invasive management   Chronic combined CHF - Last echo few weeks ago in 11/25 with a EF 35-40% with wall motion abnormality, mildly reduced RV  -EF noted to be depressed at 35% per last echocardiogram on 11/3.  Seen by cards, on account of numerous comorbidities no further invasive evaluation was recommended. - Appears euvolemic, given fluids for DKA yesterday, now discontinued -Diurese periodically as needed   Acute metabolic encephalopathy Cognitive impairment Patient noted to be acutely altered and unable to recognize family or location.  At baseline reportedly is able to recognize family and location although normally does not know the year.  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 -Baseline appears to be around 1.6, had worsening AKI earlier this admission, now improving  Microcytic anemia Stable     DVT prophylaxis: Lovenox  Code Status:  DNR Family Communication: No family at bedside, updated daughter Disposition Plan: SN F with outpatient palliative care  Consultants:    Procedures:   Antimicrobials:    Objective: Vitals:   11/24/24 0400 11/24/24 0501 11/24/24 0740 11/24/24 0751  BP: (!) 105/56  105/61 105/60  Pulse: 74  64   Resp: 16  20   Temp: 98.5 F (36.9 C)  98.2 F (36.8 C) 98.2 F (36.8 C)  TempSrc: Oral  Oral   SpO2:   91%   Weight:  57.5 kg    Height:        Intake/Output Summary (Last 24 hours) at 11/24/2024 1003 Last data filed at 11/24/2024 0954 Gross per 24 hour  Intake 2113.99 ml  Output 650 ml  Net 1463.99 ml   Filed Weights   11/22/24 0343 11/23/24 0500 11/24/24 0501  Weight: 60.4 kg 59.4 kg 57.5 kg    Examination:  General exam: Chronically ill, very poor historian, awake alert, oriented to self only, moderate cognitive deficits Respiratory system: Scattered rhonchi, decreased breath sounds at the bases Cardiovascular system: S1 & S2 heard, RRR.  Abd: nondistended, soft and nontender.Normal bowel sounds heard. Central nervous system: Awake alert, oriented to self only Extremities: no edema Skin: No rashes Psychiatry: Flat affect   Data Reviewed:   CBC: Recent Labs  Lab 11/21/24 1229 11/21/24 1247 11/21/24 1248 11/22/24 0153 11/23/24 0230 11/24/24 0307  WBC 14.8*  --   --  21.5* 12.4* 11.1*  NEUTROABS 11.9*  --   --   --   --   --   HGB 12.2* 13.6 13.6 10.4* 10.5* 10.8*  HCT 37.1* 40.0 40.0 31.2* 34.0* 32.8*  MCV 78.8*  --   --  77.6* 81.7 76.8*  PLT 242  --   --  189 201 234   Basic Metabolic Panel: Recent Labs  Lab 11/23/24 0728 11/23/24 1121 11/23/24 1600 11/23/24 1958 11/24/24 0307  NA 125* 131* 134* 136 137  K 3.3* 3.4* 4.0 4.0 4.1  CL 85* 91* 96* 96* 99  CO2 14* 22 24 26 25   GLUCOSE 606* 451* 110* 114* 123*  BUN 51* 50* 48* 48* 46*  CREATININE 2.69* 2.47* 2.02* 1.87* 1.85*  CALCIUM  9.0 9.3 9.4 9.6 9.3   GFR: Estimated Creatinine Clearance:  24.2 mL/min (A) (by C-G formula based on SCr of 1.85 mg/dL (H)). Liver Function Tests: Recent Labs  Lab 11/21/24 1229  AST 19  ALT 13  ALKPHOS 84  BILITOT 1.3*  PROT 7.7  ALBUMIN  3.4*   Recent Labs  Lab 11/21/24 1229  LIPASE 23   No results for input(s): AMMONIA in the last 168 hours. Coagulation Profile: No results for input(s): INR, PROTIME in the last 168 hours. Cardiac Enzymes: No results for input(s): CKTOTAL, CKMB, CKMBINDEX, TROPONINI in the last 168 hours. BNP (last 3 results) No results for input(s): PROBNP in the last 8760 hours. HbA1C: Recent Labs    11/23/24 0230  HGBA1C 12.1*   CBG: Recent Labs  Lab 11/23/24 1902 11/23/24 2055 11/23/24 2354 11/24/24 0351 11/24/24 0742  GLUCAP 108* 106* 105* 136* 132*   Lipid Profile: No results for input(s): CHOL, HDL, LDLCALC, TRIG, CHOLHDL, LDLDIRECT in the last 72 hours. Thyroid  Function Tests: No results for input(s): TSH, T4TOTAL, FREET4, T3FREE, THYROIDAB in the last 72 hours. Anemia Panel: No results for input(s): VITAMINB12, FOLATE, FERRITIN, TIBC, IRON, RETICCTPCT in the last 72 hours. Urine  analysis:    Component Value Date/Time   COLORURINE YELLOW 11/21/2024 1229   APPEARANCEUR CLEAR 11/21/2024 1229   LABSPEC 1.013 11/21/2024 1229   PHURINE 7.0 11/21/2024 1229   GLUCOSEU >=500 (A) 11/21/2024 1229   HGBUR NEGATIVE 11/21/2024 1229   BILIRUBINUR NEGATIVE 11/21/2024 1229   KETONESUR NEGATIVE 11/21/2024 1229   PROTEINUR 100 (A) 11/21/2024 1229   UROBILINOGEN 1.0 10/04/2009 1109   NITRITE NEGATIVE 11/21/2024 1229   LEUKOCYTESUR NEGATIVE 11/21/2024 1229   Sepsis Labs: @LABRCNTIP (procalcitonin:4,lacticidven:4)  ) Recent Results (from the past 240 hours)  Culture, blood (routine x 2)     Status: None (Preliminary result)   Collection Time: 11/21/24 12:29 PM   Specimen: BLOOD RIGHT ARM  Result Value Ref Range Status   Specimen Description BLOOD RIGHT  ARM  Final   Special Requests   Final    BOTTLES DRAWN AEROBIC AND ANAEROBIC Blood Culture results may not be optimal due to an inadequate volume of blood received in culture bottles   Culture   Final    NO GROWTH 3 DAYS Performed at [facility], [address] [place], [place] [address]    Report Status PENDING  Incomplete  Culture, blood (routine x 2)     Status: None (Preliminary result)   Collection Time: 11/21/24 12:29 PM   Specimen: BLOOD RIGHT ARM  Result Value Ref Range Status   Specimen Description BLOOD RIGHT ARM  Final   Special Requests   Final    BOTTLES DRAWN AEROBIC AND ANAEROBIC Blood Culture results may not be optimal due to an inadequate volume of blood received in culture bottles   Culture   Final    NO GROWTH 3 DAYS Performed at [facility], [address] [place], [place] [address]    Report Status PENDING  Incomplete  Resp panel by RT-PCR (RSV, Flu A&B, Covid) Peripheral     Status: None   Collection Time: 11/21/24 12:29 PM   Specimen: Peripheral; Nasal Swab  Result Value Ref Range Status   SARS Coronavirus 2 by RT PCR NEGATIVE NEGATIVE Final   Influenza A by PCR NEGATIVE NEGATIVE Final   Influenza B by PCR NEGATIVE NEGATIVE Final    Comment: (NOTE) The Xpert Xpress SARS-CoV-2/FLU/RSV plus assay is intended as an aid in the diagnosis of influenza from Nasopharyngeal swab specimens and should not be used as a sole basis for treatment. Nasal washings and aspirates are unacceptable for Xpert Xpress SARS-CoV-2/FLU/RSV testing.  Fact Sheet for Patients: bloggercourse.com  Fact Sheet for Healthcare Providers: seriousbroker.it  This test is not yet approved or cleared by the [place]  FDA and has been authorized for detection and/or diagnosis of SARS-CoV-2 by FDA under an Emergency Use Authorization (EUA). This EUA will remain in effect (meaning this test can be used) for the duration of  the COVID-19 declaration under Section 564(b)(1) of the Act, 21 U.S.C. section 360bbb-3(b)(1), unless the authorization is terminated or revoked.     Resp Syncytial Virus by PCR NEGATIVE NEGATIVE Final    Comment: (NOTE) Fact Sheet for Patients: bloggercourse.com  Fact Sheet for Healthcare Providers: seriousbroker.it  This test is not yet approved or cleared by the [place]  FDA and has been authorized for detection and/or diagnosis of SARS-CoV-2 by FDA under an Emergency Use Authorization (EUA). This EUA will remain in effect (meaning this test can be used) for the duration of the COVID-19 declaration under Section 564(b)(1) of the Act, 21 U.S.C. section 360bbb-3(b)(1), unless the authorization  is terminated or revoked.  Performed at [facility], [address], [place], [place] [address]   MRSA Next Gen by PCR, Nasal     Status: None   Collection Time: 11/21/24  5:50 PM   Specimen: Nasal Mucosa; Nasal Swab  Result Value Ref Range Status   MRSA by PCR Next Gen NOT DETECTED NOT DETECTED Final    Comment: (NOTE) The GeneXpert MRSA Assay (FDA approved for NASAL specimens only), is one component of a comprehensive MRSA colonization surveillance program. It is not intended to diagnose MRSA infection nor to guide or monitor treatment for MRSA infections. Test performance is not FDA approved in patients less than 77 years old. Performed at [facility], [address] [place], [place] [address]      Radiology Studies: VAS US  LOWER EXTREMITY VENOUS (DVT) (7a-7p) Result Date: 11/23/2024  Lower Venous DVT Study Patient Name:  CRAIGORY TOSTE  Date of Exam:   11/23/2024 Medical Rec #: [id number]       Accession #:    [id number] Date of Birth: September 02, 1940      Patient Gender: M Patient Age:   65 years Exam Location:  [facility] Procedure:      VAS US  LOWER EXTREMITY VENOUS (DVT) Referring Phys: MAUDE MESSICK  --------------------------------------------------------------------------------  Indications: Edema.  Risk Factors: None identified. Limitations: Poor ultrasound/tissue interface and patient positioning, patient immobility, patient leg contracture. Comparison Study: No prior studies. Performing Technologist: Cordella Collet RVT  Examination Guidelines: A complete evaluation includes B-mode imaging, spectral Doppler, color Doppler, and power Doppler as needed of all accessible portions of each vessel. Bilateral testing is considered an integral part of a complete examination. Limited examinations for reoccurring indications may be performed as noted. The reflux portion of the exam is performed with the patient in reverse Trendelenburg.  +---------+---------------+---------+-----------+----------+--------------+ RIGHT    CompressibilityPhasicitySpontaneityPropertiesThrombus Aging +---------+---------------+---------+-----------+----------+--------------+ CFV      Full           Yes      Yes                                 +---------+---------------+---------+-----------+----------+--------------+ SFJ      Full                                                        +---------+---------------+---------+-----------+----------+--------------+ FV Prox  Full                                                        +---------+---------------+---------+-----------+----------+--------------+ FV Mid   Full           Yes      Yes                                 +---------+---------------+---------+-----------+----------+--------------+ FV Distal               Yes      Yes                                 +---------+---------------+---------+-----------+----------+--------------+  PFV      Full                                                        +---------+---------------+---------+-----------+----------+--------------+ POP                     Yes      Yes                                  +---------+---------------+---------+-----------+----------+--------------+ PTV      Full                                                        +---------+---------------+---------+-----------+----------+--------------+ PERO     Full                                                        +---------+---------------+---------+-----------+----------+--------------+   +---------+---------------+---------+-----------+----------+--------------+ LEFT     CompressibilityPhasicitySpontaneityPropertiesThrombus Aging +---------+---------------+---------+-----------+----------+--------------+ CFV      Full           Yes      Yes                                 +---------+---------------+---------+-----------+----------+--------------+ SFJ      Full                                                        +---------+---------------+---------+-----------+----------+--------------+ FV Prox  Full                                                        +---------+---------------+---------+-----------+----------+--------------+ FV Mid   Full                                                        +---------+---------------+---------+-----------+----------+--------------+ FV DistalFull                                                        +---------+---------------+---------+-----------+----------+--------------+ PFV      Full                                                        +---------+---------------+---------+-----------+----------+--------------+  POP      Full           Yes      Yes                                 +---------+---------------+---------+-----------+----------+--------------+ PTV      Full                                                        +---------+---------------+---------+-----------+----------+--------------+ PERO     Full                                                         +---------+---------------+---------+-----------+----------+--------------+     Summary: RIGHT: - There is no evidence of deep vein thrombosis in the lower extremity. However, portions of this examination were limited- see technologist comments above.  - No cystic structure found in the popliteal fossa.  LEFT: - There is no evidence of deep vein thrombosis in the lower extremity. However, portions of this examination were limited- see technologist comments above.  - No cystic structure found in the popliteal fossa.  *See table(s) above for measurements and observations. Electronically signed by Lonni Gaskins MD on 11/23/2024 at 10:39:59 AM.    Final      Scheduled Meds:  vitamin B-12  1,000 mcg Oral Daily   enoxaparin  (LOVENOX ) injection  30 mg Subcutaneous Q24H   insulin  aspart  0-15 Units Subcutaneous TID WC   insulin  glargine-yfgn  15 Units Subcutaneous Daily   sodium chloride  flush  3 mL Intravenous Q12H   Continuous Infusions:  piperacillin -tazobactam 3.375 g (11/24/24 0626)     LOS: 3 days    Time spent:    Sigurd Pac, MD [facility]   11/24/2024, 10:03 AM

## 2024-11-24 NOTE — Plan of Care (Signed)
   Problem: Health Behavior/Discharge Planning: Goal: Ability to manage health-related needs will improve Outcome: Progressing

## 2024-11-24 NOTE — Progress Notes (Signed)
 Daily Progress Note   Date: 11/24/2024   Patient Name: Andre Stout  DOB: [birth year]  MRN: [id number]  Age / Sex: 84 y.o., male  Attending Physician: Andre Frames, MD Primary Care Physician: Pcp, No Admit Date: 11/21/2024 Length of Stay: 3 days  Reason for Follow-up: Establishing goals of care  Past Medical History:  Diagnosis Date   CKD (chronic kidney disease) stage 3, GFR 30-59 ml/min (HCC)    Diabetes mellitus without complication (HCC)    Glaucoma    Heart block    complete heart block   History of CVA (cerebrovascular accident)    Hypertension    Prostate cancer (HCC)    Been 3-4 years ago    Assessment & Plan:   HPI/Patient Profile:  84 y.o. male  with past medical history of dementia, T2DM, history of prostate cancer, chronic anemia admitted on 11/21/2024 with pneumonia and acute metabolic encephalopathy.    Patient and family previously met with palliative during prior hospitalization 7/22-7/31 and family expressed that patient was not ready for hospice discussion at that time. Plan was for outpatient palliative following as an outpatient but daughter confirmed that was never set up.    Per H&P on 11/22 by Claudene MD, initial work up significant for elevated BNP 1240 concerning for congestive heart failure exacerbation. CXR showed new right upper lobe consolidation consistent with pneumonia. CT chest showed pulmonary consolidation in right upper lobe.    Palliative medicine consulted for goals of care conversation.   SUMMARY OF RECOMMENDATIONS   DNR-limited Family reiterated that patient is not ready for hospice discussions PMT will continue to follow  Will discuss with daughter about completing a MOST form as current documentation in the patient's physical chart is not adequate  Symptom Management:  Per primary team  Code Status: DNR - Limited (DNR/DNI)  Prognosis: Unable to determine  Discharge Planning: To Be Determined  Discussed with: Fairy MD  about the patient's and daughter's continued resistance to considering hospice or palliative following as an outpatient.   Subjective:   Subjective: Chart Reviewed. Updates received. Patient Assessed. Created space and opportunity for patient  and family to explore thoughts and feelings regarding current medical situation.  Today's Discussion: Assessed patient's capacity and was able to answer yes/no questions appropriately except for the reason he was in the hospital. Patient denied any pain and responded that he was hungry. Asked if patient would be interested in discussing palliative or hospice. Patient responded with no. Patient kept eyes closed the entire time and not receptive to further conversations.   Plan to discuss with daughter about completing a MOST form when she is able to visit  Review of Systems  Unable to perform ROS   Objective:   Primary Diagnoses: Present on Admission:  Sepsis due to pneumonia (HCC)  Acute metabolic encephalopathy  CKD (chronic kidney disease) stage 3, GFR 30-59 ml/min (HCC)  Hyponatremia  Microcytic anemia   Vital Signs:  BP (!) 106/57 (BP Location: Left Arm)   Pulse 77   Temp 98.7 F (37.1 C) (Oral)   Resp 14   Ht 5' 5 (1.651 m)   Wt 57.5 kg   SpO2 97%   BMI 21.09 kg/m   Physical Exam Constitutional:      Comments: Cachectic. Eyes remain closed but responds to voice.   HENT:     Head: Normocephalic.     Nose: Nose normal.     Mouth/Throat:     Mouth: Mucous membranes are dry.  Cardiovascular:     Rate and Rhythm: Normal rate.  Pulmonary:     Effort: Pulmonary effort is normal.  Abdominal:     Palpations: Abdomen is soft.  Musculoskeletal:     Right lower leg: No edema.     Left lower leg: No edema.  Skin:    General: Skin is warm.  Neurological:     Mental Status: He is oriented to person, place, and time.     Comments: Not oriented to situation.     Palliative Assessment/Data: 40%   Existing Vynca/ACP  Documentation: None  Thank you for allowing us  to participate in the care of Andre Stout PMT will continue to support holistically.  I personally spent a total of 25 minutes in the care of the patient today including preparing to see the patient, performing a medically appropriate exam/evaluation, referring and communicating with other health care professionals, and documenting clinical information in the EHR.   Andre Stout Andre Stout  Palliative Medicine Team  Team Phone # [phone] (Nights/Weekends) 11/24/2024 1:48 PM

## 2024-11-24 NOTE — Progress Notes (Signed)
 Physical Therapy Treatment Patient Details Name: Andre Stout MRN: [id number] DOB: 1940/09/06 Today's Date: 11/24/2024   History of Present Illness The pt is an 84 yo male presenting 11/22 with weakness and BLE swelling. Admitted for sepsis due to PNA. PMH includes: dementia, AV block, HTN, T2DM, prostate CA, glaucoma, CHF, CVA, HLD    PT Comments  Pt requires heavy tactile stimulation and frequent reorientation to tasks as pt is lethargic throughout session; pt is able to follow some single step commands. Pt was able to complete bed mobility and several sit to stands w/ heavy physical assistance. Pt was able to sit edge of bed w/out physical assistance and bilateral UE supported for ~1 minute, but requires constant cueing. Pt would benefit from further bed mobility and transfer training. PT will continue to treat pt while he is admitted. Patient will benefit from continued inpatient follow up therapy, <3 hours/day.     If plan is discharge home, recommend the following: Two people to help with walking and/or transfers;Two people to help with bathing/dressing/bathroom;Assistance with cooking/housework;Assistance with feeding;Direct supervision/assist for medications management;Direct supervision/assist for financial management;Assist for transportation;Help with stairs or ramp for entrance;Supervision due to cognitive status   Can travel by private vehicle     No  Equipment Recommendations  Wheelchair (measurements PT);Wheelchair cushion (measurements PT);Hoyer lift    Recommendations for [employer]       Precautions / Restrictions Precautions Precautions: Fall Recall of Precautions/Restrictions: Impaired Restrictions Weight Bearing Restrictions Per Provider Order: No     Mobility  Bed Mobility Overal bed mobility: Needs Assistance Bed Mobility: Supine to Sit, Sit to Supine     Supine to sit: Max assist, HOB elevated Sit to supine: Max assist, HOB elevated   General bed  mobility comments: pt able to advance bilateral LE towards edge of bed w/ tactile and verbal cues, but requires max A for completing bed mobility.    Transfers Overall transfer level: Needs assistance Equipment used: 1 person hand held assist Transfers: Sit to/from Stand Sit to Stand: Max assist           General transfer comment: pt completed 2 STS from EOB w/ max A. VC given for sequencing and to increase forward trunk lean for better hip clearance. Pt requries physical assistance to obtain greater forward lean and max A for power-up. Once standing, pt is total A.    Ambulation/Gait                   Stairs             Wheelchair Mobility     Tilt Bed    Modified Rankin (Stroke Patients Only)       Balance Overall balance assessment: Needs assistance Sitting-balance support: Bilateral upper extremity supported, Feet supported Sitting balance-Leahy Scale: Poor Sitting balance - Comments: pt able to sit EOB w/ BUE support and no physical assistance for ~1 min; requires frequent reorienting to task   Standing balance support: Bilateral upper extremity supported, During functional activity, Reliant on assistive device for balance Standing balance-Leahy Scale: Zero Standing balance comment: reliant on external support for maintaining upright                            Communication Communication Communication: Impaired Factors Affecting Communication: Hearing impaired  Cognition Arousal: Obtunded Behavior During Therapy: Flat affect   PT - Cognitive impairments: No family/caregiver present to determine baseline, Orientation, Safety/Judgement, Problem solving,  Memory   Orientation impairments: Place, Time, Situation                   PT - Cognition Comments: only oriented to self Following commands: Impaired Following commands impaired: Follows one step commands inconsistently    Cueing Cueing Techniques: Verbal cues, Tactile  cues, Gestural cues  Exercises      General Comments General comments (skin integrity, edema, etc.): no signs of acute distress. pt does report some dizziness upon sitting EOB.      Pertinent Vitals/Pain Pain Assessment Pain Assessment: No/denies pain    Home Living                          Prior Function            PT Goals (current goals can now be found in the care plan section) Acute Rehab PT Goals Patient Stated Goal: none stated PT Goal Formulation: Patient unable to participate in goal setting Time For Goal Achievement: 12/13/24 Potential to Achieve Goals: Fair Progress towards PT goals: Progressing toward goals    Frequency    Min 2X/week      PT Plan      Co-evaluation              AM-PAC PT 6 Clicks Mobility   Outcome Measure  Help needed turning from your back to your side while in a flat bed without using bedrails?: A Lot Help needed moving from lying on your back to sitting on the side of a flat bed without using bedrails?: A Lot Help needed moving to and from a bed to a chair (including a wheelchair)?: Total Help needed standing up from a chair using your arms (e.g., wheelchair or bedside chair)?: A Lot Help needed to walk in hospital room?: Total Help needed climbing 3-5 steps with a railing? : Total 6 Click Score: 9    End of Session Equipment Utilized During Treatment: Gait belt Activity Tolerance: Patient limited by lethargy Patient left: in bed;with call bell/phone within reach;with bed alarm set Nurse Communication: Mobility status PT Visit Diagnosis: Other abnormalities of gait and mobility (R26.89);Difficulty in walking, not elsewhere classified (R26.2);Muscle weakness (generalized) (M62.81)     Time: 8944-8880 PT Time Calculation (min) (ACUTE ONLY): 24 min  Charges:    $Therapeutic Activity: 23-37 mins PT General Charges $$ ACUTE PT VISIT: 1 Visit                     Leontine Hilt DPT Acute Rehab  Services [phone] Prefer contact via chat    Leontine NOVAK Bran Aldridge 11/24/2024, 12:45 PM

## 2024-11-24 NOTE — TOC Progression Note (Addendum)
 Transition of Care [facility]) - Progression Note    Patient Details  Name: Andre Stout MRN: [id number] Date of Birth: May 14, 1940  [facility] CM/SW Contact  [name], [place] Phone Number: 11/24/2024, 10:13 AM  Clinical Narrative:   Palliative has been consulted. Per daily meeting with treatment team, patient is lethargic and is not eating/drinking at this time. CSW to follow up on disposition at a later time.  10:25 AM Per Palliative, family is not receptive to hospice or palliative at this time.   Michaelle Amble emailed CSW back and declined current bed offers for SNF. CSW informed Michaelle that those are the only bed offers patient has at this time but CSW is awaiting responses from other facilities.   12:27 PM CSW spoke with Michaelle about updated SNF bed offer. Billie inquired about [facility]. CSW informed Michaelle that [facility] denied at this time.  2:32 PM CSW emailed [facility], updated SNF bed offer [facility]).   3:49 PM CSW emailed Billie another list with updated bed offers [facility]).  CSW will continue to follow.    Expected Discharge Plan: Skilled Nursing Facility Barriers to Discharge: Continued Medical Work up, SNF Pending bed offer, English As A Second Language Teacher               Expected Discharge Plan and Services In-house Referral: Clinical Social Work   Post Acute Care Choice: Skilled Nursing Facility Living arrangements for the past 2 months: Single Family Home                 DME Arranged: N/A DME Agency: NA       HH Arranged: NA HH Agency: NA         Social Drivers of Health (SDOH) Interventions SDOH Screenings   Food Insecurity: No Food Insecurity (11/03/2024)  Housing: Low Risk  (11/03/2024)  Transportation Needs: Patient Unable To Answer (11/03/2024)  Utilities: Patient Unable To Answer (11/03/2024)  Alcohol Screen: Low Risk  (07/24/2023)  Depression (PHQ2-9): Low Risk  (07/24/2023)  Financial Resource Strain: Low Risk   (10/26/2024)  Physical Activity: Inactive (10/26/2024)  Social Connections: Unknown (11/03/2024)  Recent Concern: Social Connections - Socially Isolated (10/26/2024)  Stress: No Stress Concern Present (10/26/2024)  Tobacco Use: Medium Risk (11/21/2024)  Health Literacy: Adequate Health Literacy (07/24/2023)    Readmission Risk Interventions    11/06/2024   10:49 AM 08/15/2022    9:25 AM  Readmission Risk Prevention Plan  Transportation Screening Complete Complete  PCP or Specialist Appt within 5-7 Days  Complete  Home Care Screening  Complete  Medication Review (RN CM)  Complete  Medication Review (RN Care Manager) Complete   PCP or Specialist appointment within 3-5 days of discharge Complete   HRI or Home Care Consult Complete   Palliative Care Screening Not Applicable   Skilled Nursing Facility Not Applicable

## 2024-11-25 DIAGNOSIS — R4189 Other symptoms and signs involving cognitive functions and awareness: Secondary | ICD-10-CM | POA: Diagnosis not present

## 2024-11-25 DIAGNOSIS — Z515 Encounter for palliative care: Secondary | ICD-10-CM | POA: Diagnosis not present

## 2024-11-25 LAB — GLUCOSE, CAPILLARY
Glucose-Capillary: 143 mg/dL — ABNORMAL HIGH (ref 70–99)
Glucose-Capillary: 184 mg/dL — ABNORMAL HIGH (ref 70–99)
Glucose-Capillary: 188 mg/dL — ABNORMAL HIGH (ref 70–99)
Glucose-Capillary: 226 mg/dL — ABNORMAL HIGH (ref 70–99)
Glucose-Capillary: 236 mg/dL — ABNORMAL HIGH (ref 70–99)
Glucose-Capillary: 266 mg/dL — ABNORMAL HIGH (ref 70–99)
Glucose-Capillary: 267 mg/dL — ABNORMAL HIGH (ref 70–99)
Glucose-Capillary: 282 mg/dL — ABNORMAL HIGH (ref 70–99)

## 2024-11-25 LAB — CBC
HCT: 33.1 % — ABNORMAL LOW (ref 39.0–52.0)
Hemoglobin: 10.9 g/dL — ABNORMAL LOW (ref 13.0–17.0)
MCH: 25.6 pg — ABNORMAL LOW (ref 26.0–34.0)
MCHC: 32.9 g/dL (ref 30.0–36.0)
MCV: 77.9 fL — ABNORMAL LOW (ref 80.0–100.0)
Platelets: 240 K/uL (ref 150–400)
RBC: 4.25 MIL/uL (ref 4.22–5.81)
RDW: 17.5 % — ABNORMAL HIGH (ref 11.5–15.5)
WBC: 9.2 K/uL (ref 4.0–10.5)
nRBC: 0 % (ref 0.0–0.2)

## 2024-11-25 LAB — BASIC METABOLIC PANEL WITH GFR
Anion gap: 11 (ref 5–15)
BUN: 46 mg/dL — ABNORMAL HIGH (ref 8–23)
CO2: 27 mmol/L (ref 22–32)
Calcium: 9.5 mg/dL (ref 8.9–10.3)
Chloride: 97 mmol/L — ABNORMAL LOW (ref 98–111)
Creatinine, Ser: 1.74 mg/dL — ABNORMAL HIGH (ref 0.61–1.24)
GFR, Estimated: 38 mL/min — ABNORMAL LOW (ref >60)
Glucose, Bld: 183 mg/dL — ABNORMAL HIGH (ref 70–99)
Potassium: 3.8 mmol/L (ref 3.5–5.1)
Sodium: 135 mmol/L (ref 135–145)

## 2024-11-25 LAB — HEMOGLOBIN A1C
Hgb A1c MFr Bld: 11.4 % — ABNORMAL HIGH (ref 4.8–5.6)
Mean Plasma Glucose: 280 mg/dL

## 2024-11-25 LAB — VITAMIN B1: Vitamin B1 (Thiamine): 100.5 nmol/L (ref 66.5–200.0)

## 2024-11-25 MED ORDER — TORSEMIDE 20 MG PO TABS
20.0000 mg | ORAL_TABLET | Freq: Every day | ORAL | Status: DC
  Administered 2024-11-25 – 2024-11-30 (×5): 20 mg via ORAL
  Filled 2024-11-25 (×5): qty 1

## 2024-11-25 MED ORDER — INSULIN GLARGINE-YFGN 100 UNIT/ML ~~LOC~~ SOLN
20.0000 [IU] | Freq: Every day | SUBCUTANEOUS | Status: DC
  Filled 2024-11-25: qty 0.2

## 2024-11-25 NOTE — Progress Notes (Signed)
 Speech Language Pathology Treatment: Dysphagia  Patient Details Name: Andre Stout MRN: [id number] DOB: [birth year] Today's Date: 11/25/2024 Time: 1655-1710 SLP Time Calculation (min) (ACUTE ONLY): 15 min  Assessment / Plan / Recommendation Clinical Impression  Patient seen by SLP for skilled treatment focused on dysphagia goals. Patient had eyes closed but was awake, alert and receptive to PO's. SLP assessed his toleration at bedside of thin liquids via straw sips and approximately puree solids (ice cream). He was total assist for feeding for all PO's. Continues with oral delay even with puree solids and suspected swallow initiation delay with liquids. A couple instances of delayed cough response observed following thin liquid sips. SLP recommending to continue with Dys 1 (puree) solids, thin liquids diet with full supervision.   HPI HPI: Andre Stout is an 84 y/o, chronically ill male. Recently hospitalized 11/2-7 due to failure to thrive, poor oral intake and diarrhea, noted to be in CHF exacerbation, AKI, hyponatremia and multifocal pneumonia, treated with antibiotics, diuresed, there was discussion on palliative care but not pursued.  Brought to the ED 11/22 with increased weakness and confusion, also history of worsening cough for few days, some dyspnea.  Chest x-ray noted new right upper lobe consolidation concerning for developing pneumonia, CT chest abdomen pelvis noted large focal areas of pulmonary consolidation right upper lobe and left lower lobe with progression of right upper lobe disease and moderate left and minimal right pleural effusions improved since prior study. Patient most recently seen by SLP 08/2024 during which he was placed on a dysphagia 1, thin liquid diet. Perviously seen in July 2025 with a cognitively based dysphagia.  PMH of  type 2 diabetes mellitus, dementia,hypertension, chronic anemia, complete heart block, prostate cancer.      SLP Plan              Recommendations  Diet recommendations: Dysphagia 1 (puree);Thin liquid Liquids provided via: Cup Medication Administration: Crushed with puree Supervision: Full supervision/cueing for compensatory strategies;Staff to assist with self feeding Compensations: Slow rate;Small sips/bites Postural Changes and/or Swallow Maneuvers: Seated upright 90 degrees                  Oral care BID;Staff/trained caregiver to provide oral care   Frequent or constant Supervision/Assistance Dysphagia, oropharyngeal phase (R13.12)          Norleen IVAR Blase, MA, CCC-SLP Speech Therapy

## 2024-11-25 NOTE — Plan of Care (Signed)
  Problem: Nutrition: Goal: Adequate nutrition will be maintained Outcome: Progressing   Problem: Skin Integrity: Goal: Risk for impaired skin integrity will decrease Outcome: Progressing   Problem: Nutritional: Goal: Maintenance of adequate nutrition will improve Outcome: Progressing Goal: Progress toward achieving an optimal weight will improve Outcome: Progressing

## 2024-11-25 NOTE — Inpatient Diabetes Management (Signed)
 Inpatient Diabetes Program Recommendations  AACE/ADA: New Consensus Statement on Inpatient Glycemic Control (2015)  Target Ranges:  Prepandial:   less than 140 mg/dL      Peak postprandial:   less than 180 mg/dL (1-2 hours)      Critically ill patients:  140 - 180 mg/dL   Lab Results  Component Value Date   GLUCAP 188 (H) 11/25/2024   HGBA1C 12.1 (H) 11/23/2024    Review of Glycemic Control  Latest Reference Range & Units 11/25/24 03:51 11/25/24 06:53 11/25/24 07:46  Glucose-Capillary 70 - 99 mg/dL 815 (H) 773 (H) 811 (H)   Diabetes history: DM 2 Outpatient Diabetes medications:  Novolog   5-7 units tid with meals Lantus  25 units daily Metformin  500 mg bid Current orders for Inpatient glycemic control:  Novolog  0-15 units tid with meals  Semglee  15 units daily  Inpatient Diabetes Program Recommendations:    Agree with current orders.  A1C high.  Patient see's Dr. Trixie and had a televist on 10/27/24.   Will follow.   Thanks,  Randall Bullocks, RN, BC-ADM Inpatient Diabetes Coordinator Pager [phone]  (8a-5p)

## 2024-11-25 NOTE — Progress Notes (Signed)
 Pt off CPAP at this time due to pt keeps taking it off. Pt has pulled mask off multiple times. Pt placed back on [place]. RN made aware

## 2024-11-25 NOTE — Progress Notes (Signed)
 PROGRESS NOTE    Andre Stout  FMW:[id number] DOB: 03/07/1940 DOA: 11/21/2024 PCP: Pcp, No  84/M chronically ill with Dementia, Type 2 diabetes mellitus, hypertension, chronic anemia, complete heart block, prostate cancer, recently hospitalized 11/2-7 presented with failure to thrive, poor oral intake and diarrhea, noted to be in CHF exacerbation, AKI, hyponatremia and multifocal pneumonia, treated with antibiotics, diuresed, there was discussion on palliative care but not pursued.  Brought to the ED 11/22 with increased weakness and confusion, also history of worsening cough for few days, some dyspnea.  In the ED afebrile, vital stable, CBG over 300, WBC 14.8, lactate 4.9, BNP 1240, troponin 23, creatinine 1.6, chest x-ray noted new right upper lobe consolidation concerning for developing pneumonia, CT chest abdomen pelvis noted large focal areas of pulmonary consolidation right upper lobe and left lower lobe with progression of right upper lobe disease and moderate left and minimal right pleural effusions improved since prior study. - Admitted, started on antibiotics, SLP eval ongoing - 11/24, labs concerning for DKA, CBGs in the 800 range - Palliative consult, remains DNR, declines hospice services   Subjective: - Blood sugars improved, feels fair, denies any new complaints, does not engage or interact much  Assessment and Plan:  Severe sepsis secondary to pneumonia Dysphagia Patient presented with reports of increased weakness with reports of increasing cough over the last 2 days.  Found to be tachypneic with white blood cell count elevated at 14.8 and initial lactic acid 4.9.  CT chest abdomen pelvis revealed concern for large focal areas of pulmonary consolidation in the right upper lobe and left lower lobe with progression of the right upper lobe consolidation.  Review of records note previous speech therapy evaluation recommended dysphagia II diet - Did with IV Zosyn , changed to  Augmentin  yesterday - SLP following, MBS completed, noted to have oropharyngeal dysphagia, - Blood cultures negative thus far -Pulmonary toilet, add DuoNebs, Mucinex  Has significant chronic disease burden, and significant failure to thrive discussed hospice with dtr, she understands poor prognosis, patient declines hospice, is at risk of continued decline  DKA Type 2 diabetes mellitus - Insulin  was held on admission as he was hypoglycemic recent admission, developed significant hyperglycemia and DKA yesterday was getting excessive amounts of Glucerna - Now off insulin  gtt., continue glargine, add SSI -Increase dose  Dementia -memory and cognitive decline for 3years  Intermittent complete heart block -Followed by cardiology as recent as few weeks ago, patient declined PPM, also felt to be a poor candidate for invasive management   Chronic combined CHF - Last echo few weeks ago in 11/25 with a EF 35-40% with wall motion abnormality, mildly reduced RV  -EF noted to be depressed at 35% per last echocardiogram on 11/3.  Seen by cards, on account of numerous comorbidities no further invasive evaluation was recommended. -Add low-dose torsemide    Acute metabolic encephalopathy Cognitive impairment Patient noted to be acutely altered and unable to recognize family or location.  At baseline reportedly is able to recognize family and location although normally does not know the year.  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 -Baseline appears to be around 1.6, had worsening AKI earlier this admission, now improving   Microcytic anemia Stable     DVT prophylaxis: Lovenox  Code Status: DNR Family Communication: No family at bedside, updated daughter earlier, will call again Disposition  Plan: SN F with outpatient palliative  care  Consultants:    Procedures:   Antimicrobials:    Objective: Vitals:   11/25/24 0002 11/25/24 0349 11/25/24 0748 11/25/24 1205  BP: (!) 107/58 (!) 111/54 112/72 114/63  Pulse: 64 66 67 81  Resp: 18 19 (!) 26 (!) 22  Temp: 98.7 F (37.1 C) 98.7 F (37.1 C) 98.7 F (37.1 C) (!) 97.5 F (36.4 C)  TempSrc: Axillary Axillary Axillary Oral  SpO2: 96% 94% 91% 92%  Weight:  59.7 kg    Height:        Intake/Output Summary (Last 24 hours) at 11/25/2024 1216 Last data filed at 11/24/2024 2130 Gross per 24 hour  Intake 540 ml  Output 400 ml  Net 140 ml   Filed Weights   11/23/24 0500 11/24/24 0501 11/25/24 0349  Weight: 59.4 kg 57.5 kg 59.7 kg    Examination:  General exam: Chronically ill, very poor historian, awake alert, oriented to self only, moderate cognitive deficits Respiratory system: Scattered rhonchi, decreased breath sounds at the bases Cardiovascular system: S1 & S2 heard, RRR.  Abd: nondistended, soft and nontender.Normal bowel sounds heard. Central nervous system: Awake alert, oriented to self only Extremities: no edema Skin: No rashes Psychiatry: Flat affect   Data Reviewed:   CBC: Recent Labs  Lab 11/21/24 1229 11/21/24 1247 11/21/24 1248 11/22/24 0153 11/23/24 0230 11/24/24 0307 11/25/24 0221  WBC 14.8*  --   --  21.5* 12.4* 11.1* 9.2  NEUTROABS 11.9*  --   --   --   --   --   --   HGB 12.2*   < > 13.6 10.4* 10.5* 10.8* 10.9*  HCT 37.1*   < > 40.0 31.2* 34.0* 32.8* 33.1*  MCV 78.8*  --   --  77.6* 81.7 76.8* 77.9*  PLT 242  --   --  189 201 234 240   < > = values in this interval not displayed.   Basic Metabolic Panel: Recent Labs  Lab 11/23/24 1121 11/23/24 1600 11/23/24 1958 11/24/24 0307 11/25/24 0221  NA 131* 134* 136 137 135  K 3.4* 4.0 4.0 4.1 3.8  CL 91* 96* 96* 99 97*  CO2 22 24 26 25 27   GLUCOSE 451* 110* 114* 123* 183*  BUN 50* 48* 48* 46* 46*  CREATININE 2.47* 2.02* 1.87* 1.85* 1.74*  CALCIUM  9.3 9.4 9.6 9.3  9.5   GFR: Estimated Creatinine Clearance: 26.7 mL/min (A) (by C-G formula based on SCr of 1.74 mg/dL (H)). Liver Function Tests: Recent Labs  Lab 11/21/24 1229  AST 19  ALT 13  ALKPHOS 84  BILITOT 1.3*  PROT 7.7  ALBUMIN  3.4*   Recent Labs  Lab 11/21/24 1229  LIPASE 23   No results for input(s): AMMONIA in the last 168 hours. Coagulation Profile: No results for input(s): INR, PROTIME in the last 168 hours. Cardiac Enzymes: No results for input(s): CKTOTAL, CKMB, CKMBINDEX, TROPONINI in the last 168 hours. BNP (last 3 results) No results for input(s): PROBNP in the last 8760 hours. HbA1C: Recent Labs    11/23/24 0230  HGBA1C 12.1*   CBG: Recent Labs  Lab 11/24/24 2007 11/25/24 0004 11/25/24 0351 11/25/24 0653 11/25/24 0746  GLUCAP 133* 143* 184* 226* 188*   Lipid Profile: No results for input(s): CHOL, HDL, LDLCALC, TRIG, CHOLHDL, LDLDIRECT in the last 72 hours. Thyroid  Function Tests: No results for input(s): TSH, T4TOTAL, FREET4, T3FREE, THYROIDAB in the last 72 hours. Anemia Panel: No results for input(s):  VITAMINB12, FOLATE, FERRITIN, TIBC, IRON, RETICCTPCT in the last 72 hours. Urine analysis:    Component Value Date/Time   COLORURINE YELLOW 11/21/2024 1229   APPEARANCEUR CLEAR 11/21/2024 1229   LABSPEC 1.013 11/21/2024 1229   PHURINE 7.0 11/21/2024 1229   GLUCOSEU >=500 (A) 11/21/2024 1229   HGBUR NEGATIVE 11/21/2024 1229   BILIRUBINUR NEGATIVE 11/21/2024 1229   KETONESUR NEGATIVE 11/21/2024 1229   PROTEINUR 100 (A) 11/21/2024 1229   UROBILINOGEN 1.0 10/04/2009 1109   NITRITE NEGATIVE 11/21/2024 1229   LEUKOCYTESUR NEGATIVE 11/21/2024 1229   Sepsis Labs: @LABRCNTIP (procalcitonin:4,lacticidven:4)  ) Recent Results (from the past 240 hours)  Culture, blood (routine x 2)     Status: None (Preliminary result)   Collection Time: 11/21/24 12:29 PM   Specimen: BLOOD RIGHT ARM  Result Value Ref  Range Status   Specimen Description BLOOD RIGHT ARM  Final   Special Requests   Final    BOTTLES DRAWN AEROBIC AND ANAEROBIC Blood Culture results may not be optimal due to an inadequate volume of blood received in culture bottles   Culture   Final    NO GROWTH 4 DAYS Performed at [facility], [address] [place], [place] [address]    Report Status PENDING  Incomplete  Culture, blood (routine x 2)     Status: None (Preliminary result)   Collection Time: 11/21/24 12:29 PM   Specimen: BLOOD RIGHT ARM  Result Value Ref Range Status   Specimen Description BLOOD RIGHT ARM  Final   Special Requests   Final    BOTTLES DRAWN AEROBIC AND ANAEROBIC Blood Culture results may not be optimal due to an inadequate volume of blood received in culture bottles   Culture   Final    NO GROWTH 4 DAYS Performed at [facility], [address], [place], [place] [address]    Report Status PENDING  Incomplete  Resp panel by RT-PCR (RSV, Flu A&B, Covid) Peripheral     Status: None   Collection Time: 11/21/24 12:29 PM   Specimen: Peripheral; Nasal Swab  Result Value Ref Range Status   SARS Coronavirus 2 by RT PCR NEGATIVE NEGATIVE Final   Influenza A by PCR NEGATIVE NEGATIVE Final   Influenza B by PCR NEGATIVE NEGATIVE Final    Comment: (NOTE) The Xpert Xpress SARS-CoV-2/FLU/RSV plus assay is intended as an aid in the diagnosis of influenza from Nasopharyngeal swab specimens and should not be used as a sole basis for treatment. Nasal washings and aspirates are unacceptable for Xpert Xpress SARS-CoV-2/FLU/RSV testing.  Fact Sheet for Patients: bloggercourse.com  Fact Sheet for Healthcare Providers: seriousbroker.it  This test is not yet approved or cleared by the [place]  FDA and has been authorized for detection and/or diagnosis of SARS-CoV-2 by FDA under an Emergency Use Authorization (EUA). This EUA will remain in effect  (meaning this test can be used) for the duration of the COVID-19 declaration under Section 564(b)(1) of the Act, 21 U.S.C. section 360bbb-3(b)(1), unless the authorization is terminated or revoked.     Resp Syncytial Virus by PCR NEGATIVE NEGATIVE Final    Comment: (NOTE) Fact Sheet for Patients: bloggercourse.com  Fact Sheet for Healthcare Providers: seriousbroker.it  This test is not yet approved or cleared by the [place]  FDA and has been authorized for detection and/or diagnosis of SARS-CoV-2 by FDA under an Emergency Use Authorization (EUA). This EUA will remain in effect (meaning this test can be used) for the duration of the COVID-19 declaration under  Section 564(b)(1) of the Act, 21 U.S.C. section 360bbb-3(b)(1), unless the authorization is terminated or revoked.  Performed at [facility], [address] [place], [place] [address]   MRSA Next Gen by PCR, Nasal     Status: None   Collection Time: 11/21/24  5:50 PM   Specimen: Nasal Mucosa; Nasal Swab  Result Value Ref Range Status   MRSA by PCR Next Gen NOT DETECTED NOT DETECTED Final    Comment: (NOTE) The GeneXpert MRSA Assay (FDA approved for NASAL specimens only), is one component of a comprehensive MRSA colonization surveillance program. It is not intended to diagnose MRSA infection nor to guide or monitor treatment for MRSA infections. Test performance is not FDA approved in patients less than 38 years old. Performed at [facility], [address] [place], [place] [address]      Radiology Studies: DG Swallowing Func-Speech Pathology Result Date: 11/24/2024 Table formatting from the original result was not included. Modified Barium Swallow Study Patient Details Name: Andre Stout MRN: [id number] Date of Birth: 11-Mar-1940 Today's Date: 11/24/2024 HPI/PMH: HPI: Deshun Sedivy is an 84 y/o, chronically ill male. Recently hospitalized 11/2-7 due  to failure to thrive, poor oral intake and diarrhea, noted to be in CHF exacerbation, AKI, hyponatremia and multifocal pneumonia, treated with antibiotics, diuresed, there was discussion on palliative care but not pursued.  Brought to the ED 11/22 with increased weakness and confusion, also history of worsening cough for few days, some dyspnea.  Chest x-ray noted new right upper lobe consolidation concerning for developing pneumonia, CT chest abdomen pelvis noted large focal areas of pulmonary consolidation right upper lobe and left lower lobe with progression of right upper lobe disease and moderate left and minimal right pleural effusions improved since prior study. Patient most recently seen by SLP 08/2024 during which he was placed on a dysphagia 1, thin liquid diet. Perviously seen in July 2025 with a cognitively based dysphagia.  PMH of  type 2 diabetes mellitus, dementia,hypertension, chronic anemia, complete heart block, prostate cancer. Clinical Impression Patient presents with a mild oropharyngeal phase dysphagia per this MBS. No aspiration or penetration was observed with any of the tested consistencies. Patient required minimal cues at times to hold attention to bolus. Oral impairments include escape of bolus progressing to mid-chin, disorganized chewing/mashing, repetitive tongue motion, oral residue lining his tongue, and delayed swallow initiation to the valleculae. Pharyngeal impairments include partial anterior movement and collection of pharyngeal residue on valleculae. Patient's swallow function looks similar to his previous MBS on 09/03/24 which also revealed an oropharyngeal dysphagia. SLP recommending Dys 1 (puree) diet with thin liquids. SLP will continue to follow for diet toleration. Factors that may increase risk of adverse event in presence of aspiration [name] & [name] 2021):   DIGEST Swallow Severity Rating*             Safety:0                       Efficiency: 1             Overall  Pharyngeal Swallow Severity: 1 (mild) 1: mild; 2: moderate; 3: severe; 4: profound *The Dynamic Imaging Grade of Swallowing Toxicity is standardized for the head and neck cancer population, however, demonstrates promising clinical applications across populations to standardize the clinical rating of pharyngeal swallow safety and severity. Recommendations/Plan: Swallowing Evaluation Recommendations Swallowing Evaluation Recommendations Recommendations: PO diet PO Diet Recommendation: Dysphagia 1 (Pureed); Thin liquids (Level  0) Liquid Administration via: Cup Medication Administration: Crushed with puree Swallowing strategies  : Slow rate; Small bites/sips; Minimize environmental distractions Postural changes: Position pt fully upright for meals; Stay upright 30-60 min after meals Oral care recommendations: Oral care BID (2x/day) Treatment Plan Treatment Plan Treatment recommendations: Therapy as outlined in treatment plan below Functional status assessment: Patient has had a recent decline in their functional status and demonstrates the ability to make significant improvements in function in a reasonable and predictable amount of time. Treatment frequency: Min 1x/week Treatment duration: 1 week Interventions: Patient/family education; Trials of upgraded texture/liquids; Diet toleration management by SLP Recommendations Recommendations for follow up therapy are one component of a multi-disciplinary discharge planning process, led by the attending physician.  Recommendations may be updated based on patient status, additional functional criteria and insurance authorization. Assessment: Orofacial Exam: Orofacial Exam Oral Cavity: Oral Hygiene: WFL Oral Cavity - Dentition: Missing dentition Orofacial Anatomy: WFL Oral Motor/Sensory Function: WFL Anatomy: Anatomy: WFL Boluses Administered: Boluses Administered Boluses Administered: Thin liquids (Level 0); Mildly thick liquids (Level 2, nectar thick); Moderately thick  liquids (Level 3, honey thick); Puree; Solid  Oral Impairment Domain: Oral Impairment Domain Lip Closure: Escape progressing to mid-chin Tongue control during bolus hold: Not tested Bolus preparation/mastication: Disorganized chewing/mashing with solid pieces of bolus unchewed Bolus transport/lingual motion: Repetitive/disorganized tongue motion Oral residue: Residue collection on oral structures Location of oral residue : Tongue Initiation of pharyngeal swallow : Valleculae  Pharyngeal Impairment Domain: Pharyngeal Impairment Domain Soft palate elevation: No bolus between soft palate (SP)/pharyngeal wall (PW) Laryngeal elevation: Complete superior movement of thyroid  cartilage with complete approximation of arytenoids to epiglottic petiole Anterior hyoid excursion: Partial anterior movement Epiglottic movement: Complete inversion Laryngeal vestibule closure: Complete, no air/contrast in laryngeal vestibule Pharyngeal stripping wave : Present - complete Pharyngeal contraction (A/P view only): N/A Pharyngoesophageal segment opening: Complete distension and complete duration, no obstruction of flow Tongue base retraction: No contrast between tongue base and posterior pharyngeal wall (PPW) Pharyngeal residue: Collection of residue within or on pharyngeal structures Location of pharyngeal residue: Valleculae  Esophageal Impairment Domain: Esophageal Impairment Domain Esophageal clearance upright position: Complete clearance, esophageal coating Pill: No data recorded Penetration/Aspiration Scale Score: Penetration/Aspiration Scale Score 1.  Material does not enter airway: Thin liquids (Level 0); Mildly thick liquids (Level 2, nectar thick); Moderately thick liquids (Level 3, honey thick); Puree; Solid Compensatory Strategies: Compensatory Strategies Compensatory strategies: No   General Information: Caregiver present: No  Diet Prior to this Study: Regular; Thin liquids (Level 0)   Temperature : Normal   Respiratory  Status: WFL   Supplemental O2: None (Room air)   History of Recent Intubation: No  Behavior/Cognition: Alert; Cooperative; Lethargic/Drowsy; Requires cueing Self-Feeding Abilities: Needs assist with self-feeding No data recorded No data recorded Volitional Swallow: Able to elicit No data recorded Goal Planning: Prognosis for improved oropharyngeal function: Fair Barriers to Reach Goals: Cognitive deficits No data recorded No data recorded Consulted and agree with results and recommendations: Pt unable/family or caregiver not available; Nurse Pain: Pain Assessment Pain Assessment: No/denies pain End of Session: Start Time:SLP Start Time (ACUTE ONLY): 1308 Stop Time: SLP Stop Time (ACUTE ONLY): 1319 Time Calculation:SLP Time Calculation (min) (ACUTE ONLY): 11 min Charges: SLP Evaluations $ SLP Speech Visit: 1 Visit SLP Evaluations $MBS Swallow: 1 Procedure SLP visit diagnosis: No data recorded Past Medical History: Past Medical History: Diagnosis Date  CKD (chronic kidney disease) stage 3, GFR 30-59 ml/min (HCC)   Diabetes mellitus without complication (HCC)  Glaucoma   Heart block   complete heart block  History of CVA (cerebrovascular accident)   Hypertension   Prostate cancer (HCC)   Been 3-4 years ago Past Surgical History: Past Surgical History: Procedure Laterality Date  CATARACT EXTRACTION Bilateral   INSERTION PROSTATE RADIATION SEED    IR EXCHANGE BILIARY DRAIN  08/19/2023  IR EXCHANGE BILIARY DRAIN  12/19/2023  IR EXCHANGE BILIARY DRAIN  01/20/2024  IR PERC CHOLECYSTOSTOMY  08/17/2022  IR RADIOLOGIST EVAL & MGMT  09/28/2022  IR RADIOLOGIST EVAL & MGMT  10/10/2022 Norleen IVAR Blase, MA, CCC-SLP Speech Therapy     Scheduled Meds:  amoxicillin -clavulanate  1 tablet Oral Q12H   vitamin B-12  1,000 mcg Oral Daily   enoxaparin  (LOVENOX ) injection  30 mg Subcutaneous Q24H   feeding supplement  237 mL Oral BID BM   insulin  aspart  0-15 Units Subcutaneous TID WC   insulin  glargine-yfgn  15 Units Subcutaneous  Daily   sodium chloride  flush  3 mL Intravenous Q12H   torsemide   20 mg Oral Daily   Continuous Infusions:     LOS: 4 days    Time spent:    Sigurd Pac, MD [facility]   11/25/2024, 12:16 PM

## 2024-11-25 NOTE — Progress Notes (Signed)
 Pt placed on CPAP per MD order. Pt tolerating at this time

## 2024-11-25 NOTE — TOC Progression Note (Addendum)
 Transition of Care [facility]) - Progression Note    Patient Details  Name: Andre Stout MRN: [id number] Date of Birth: 01-21-1940  [facility] CM/SW Contact  Arlana JINNY Nicholaus [place] Phone Number: [phone] 11/25/2024, 9:21 AM  Clinical Narrative:   9:20 AM- CSW called the patients daughter Michaelle to follow up on SNF bed choice and left VM. Will continue to make contact and follow up on facility choice.   9:58 AM- CSW called and spoke with patients daughter about SNF choice. Patients daughter stated that the only facility they are considering is [name] and she needs time to review. If [name] not what they are looking for they plan to take the patient home. CSW will follow up with her at the EOD and start insurance auth if they are interested.   Per attending, patient needs hospice but they are declining.   CSW/CM following and monitoring for dc readiness.     Expected Discharge Plan: Skilled Nursing Facility Barriers to Discharge: Continued Medical Work up, SNF Pending bed offer, English As A Second Language Teacher               Expected Discharge Plan and Services In-house Referral: Clinical Social Work   Post Acute Care Choice: Skilled Nursing Facility Living arrangements for the past 2 months: Single Family Home                 DME Arranged: N/A DME Agency: NA       HH Arranged: NA HH Agency: NA         Social Drivers of Health (SDOH) Interventions SDOH Screenings   Food Insecurity: No Food Insecurity (11/03/2024)  Housing: Low Risk  (11/03/2024)  Transportation Needs: Patient Unable To Answer (11/03/2024)  Utilities: Patient Unable To Answer (11/03/2024)  Alcohol Screen: Low Risk  (07/24/2023)  Depression (PHQ2-9): Low Risk  (07/24/2023)  Financial Resource Strain: Low Risk  (10/26/2024)  Physical Activity: Inactive (10/26/2024)  Social Connections: Unknown (11/03/2024)  Recent Concern: Social Connections - Socially Isolated (10/26/2024)   Stress: No Stress Concern Present (10/26/2024)  Tobacco Use: Medium Risk (11/21/2024)  Health Literacy: Adequate Health Literacy (07/24/2023)    Readmission Risk Interventions    11/06/2024   10:49 AM 08/15/2022    9:25 AM  Readmission Risk Prevention Plan  Transportation Screening Complete Complete  PCP or Specialist Appt within 5-7 Days  Complete  Home Care Screening  Complete  Medication Review (RN CM)  Complete  Medication Review (RN Care Manager) Complete   PCP or Specialist appointment within 3-5 days of discharge Complete   HRI or Home Care Consult Complete   Palliative Care Screening Not Applicable   Skilled Nursing Facility Not Applicable

## 2024-11-26 ENCOUNTER — Inpatient Hospital Stay (HOSPITAL_COMMUNITY)

## 2024-11-26 DIAGNOSIS — J189 Pneumonia, unspecified organism: Secondary | ICD-10-CM | POA: Diagnosis not present

## 2024-11-26 DIAGNOSIS — R4189 Other symptoms and signs involving cognitive functions and awareness: Secondary | ICD-10-CM | POA: Diagnosis not present

## 2024-11-26 DIAGNOSIS — Z515 Encounter for palliative care: Secondary | ICD-10-CM | POA: Diagnosis not present

## 2024-11-26 DIAGNOSIS — Z7189 Other specified counseling: Secondary | ICD-10-CM | POA: Diagnosis not present

## 2024-11-26 LAB — CBC
HCT: 34.1 % — ABNORMAL LOW (ref 39.0–52.0)
Hemoglobin: 11 g/dL — ABNORMAL LOW (ref 13.0–17.0)
MCH: 25.3 pg — ABNORMAL LOW (ref 26.0–34.0)
MCHC: 32.3 g/dL (ref 30.0–36.0)
MCV: 78.4 fL — ABNORMAL LOW (ref 80.0–100.0)
Platelets: 222 K/uL (ref 150–400)
RBC: 4.35 MIL/uL (ref 4.22–5.81)
RDW: 17.4 % — ABNORMAL HIGH (ref 11.5–15.5)
WBC: 9.8 K/uL (ref 4.0–10.5)
nRBC: 0 % (ref 0.0–0.2)

## 2024-11-26 LAB — CULTURE, BLOOD (ROUTINE X 2)
Culture: NO GROWTH
Culture: NO GROWTH

## 2024-11-26 LAB — BASIC METABOLIC PANEL WITH GFR
Anion gap: 14 (ref 5–15)
BUN: 57 mg/dL — ABNORMAL HIGH (ref 8–23)
CO2: 24 mmol/L (ref 22–32)
Calcium: 9.5 mg/dL (ref 8.9–10.3)
Chloride: 100 mmol/L (ref 98–111)
Creatinine, Ser: 1.81 mg/dL — ABNORMAL HIGH (ref 0.61–1.24)
GFR, Estimated: 36 mL/min — ABNORMAL LOW (ref >60)
Glucose, Bld: 359 mg/dL — ABNORMAL HIGH (ref 70–99)
Potassium: 4 mmol/L (ref 3.5–5.1)
Sodium: 138 mmol/L (ref 135–145)

## 2024-11-26 LAB — GLUCOSE, CAPILLARY
Glucose-Capillary: 367 mg/dL — ABNORMAL HIGH (ref 70–99)
Glucose-Capillary: 346 mg/dL — ABNORMAL HIGH (ref 70–99)
Glucose-Capillary: 347 mg/dL — ABNORMAL HIGH (ref 70–99)
Glucose-Capillary: 159 mg/dL — ABNORMAL HIGH (ref 70–99)
Glucose-Capillary: 138 mg/dL — ABNORMAL HIGH (ref 70–99)
Glucose-Capillary: 199 mg/dL — ABNORMAL HIGH (ref 70–99)

## 2024-11-26 MED ORDER — INSULIN GLARGINE-YFGN 100 UNIT/ML ~~LOC~~ SOLN
25.0000 [IU] | Freq: Every day | SUBCUTANEOUS | Status: DC
  Administered 2024-11-26 – 2024-11-29 (×4): 25 [IU] via SUBCUTANEOUS
  Filled 2024-11-26 (×5): qty 0.25

## 2024-11-26 MED ORDER — INSULIN GLARGINE-YFGN 100 UNIT/ML ~~LOC~~ SOLN: 35.0000 [IU] | Freq: Every day | SUBCUTANEOUS | Status: DC

## 2024-11-26 NOTE — Progress Notes (Signed)
 PROGRESS NOTE    Andre Stout  FMW:[id number] DOB: November 24, 1940 DOA: 11/21/2024 PCP: Pcp, No  84/M chronically ill with Dementia, Type 2 diabetes mellitus, hypertension, chronic anemia, complete heart block, prostate cancer, recently hospitalized 11/2-7 presented with failure to thrive, poor oral intake and diarrhea, noted to be in CHF exacerbation, AKI, hyponatremia and multifocal pneumonia, treated with antibiotics, diuresed, there was discussion on palliative care but not pursued.  Brought to the ED 11/22 with increased weakness and confusion, also history of worsening cough for few days, some dyspnea.  In the ED afebrile, vital stable, CBG over 300, WBC 14.8, lactate 4.9, BNP 1240, troponin 23, creatinine 1.6, chest x-ray noted new right upper lobe consolidation concerning for developing pneumonia, CT chest abdomen pelvis noted large focal areas of pulmonary consolidation right upper lobe and left lower lobe with progression of right upper lobe disease and moderate left and minimal right pleural effusions improved since prior study. - Admitted, started on antibiotics, SLP eval ongoing - 11/24, labs concerning for DKA, CBGs in the 800 range - Palliative consult, remains DNR, patient was reluctant to consider hospice - 11/26 recurrent respiratory distress, concern for aspiration again - Significant failure to thrive, minimal p.o. intake, ongoing aspiration   Subjective: - Overnight noted to be in distress with some congestion as well, night team attempted CPAP/BiPAP which he could not tolerate, oral intake remains minimal to poor  Assessment and Plan:  Severe sepsis secondary to pneumonia Dysphagia Patient presented with reports of increased weakness with reports of increasing cough over the last 2 days.  Found to be tachypneic with white blood cell count elevated at 14.8 and initial lactic acid 4.9.  CT chest abdomen pelvis revealed concern for large focal areas of pulmonary consolidation in  the right upper lobe and left lower lobe with progression of the right upper lobe consolidation.  Review of records note previous speech therapy evaluation recommended dysphagia II diet - Treated with IV Zosyn  initially and then switched to oral Augmentin  - SLP following, MBS completed, noted to have oropharyngeal dysphagia, - Blood cultures negative thus far -Pulmonary toilet, add DuoNebs, Mucinex  Has significant chronic disease burden, , repeat x-ray overnight with worsening pneumonia, called and discussed situation with daughter with worsening aspiration, failure to thrive, lethargy, minimal oral intake,, I recommended comfort care, she will speak with her kids today  DKA Type 2 diabetes mellitus - Insulin  was held on admission as he was hypoglycemic recent admission, developed significant hyperglycemia and DKA yesterday was getting excessive amounts of Glucerna - Now off insulin  gtt., continue glargine, add SSI -Increase dose  Dementia -memory and cognitive decline for 3years  Intermittent complete heart block -Followed by cardiology as recent as few weeks ago, patient declined PPM, also felt to be a poor candidate for invasive management   Chronic combined CHF - Last echo few weeks ago in 11/25 with a EF 35-40% with wall motion abnormality, mildly reduced RV  -EF noted to be depressed at 35% per last echocardiogram on 11/3.  Seen by cards, on account of numerous comorbidities no further invasive evaluation was recommended. - Now on low-dose torsemide    Acute metabolic encephalopathy Cognitive impairment Patient noted to be acutely altered and unable to recognize family or location.  At baseline reportedly is able to recognize family and location although normally does not know the year.  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 -Baseline appears to be around 1.6, had worsening AKI earlier this admission, now improving   Microcytic anemia Stable     DVT prophylaxis: Lovenox  Code Status: DNR Family Communication: No family at bedside, called and updated daughter Disposition Plan: TBD  Consultants:    Procedures:   Antimicrobials:    Objective: Vitals:   11/25/24 1958 11/25/24 2356 11/26/24 0408 11/26/24 0447  BP: 115/67 116/64 (!) 100/47 (!) 107/57  Pulse: 79 71 74 65  Resp: 13 10 (!) 27 (!) 30  Temp: 98.6 F (37 C) 98 F (36.7 C) 98.4 F (36.9 C)   TempSrc: Oral Axillary Axillary   SpO2: 98% 97% 93% 96%  Weight:      Height:        Intake/Output Summary (Last 24 hours) at 11/26/2024 1031 Last data filed at 11/26/2024 0654 Gross per 24 hour  Intake 520 ml  Output 650 ml  Net -130 ml   Filed Weights   11/23/24 0500 11/24/24 0501 11/25/24 0349  Weight: 59.4 kg 57.5 kg 59.7 kg    Examination:  General exam: Chronically ill, very poor historian, awake alert, oriented to self only, moderate cognitive deficits Respiratory system: Scattered rhonchi, decreased breath sounds at the bases Cardiovascular system: S1 & S2 heard, RRR.  Abd: nondistended, soft and nontender.Normal bowel sounds heard. Central nervous system: Awake alert, oriented to self only Extremities: no edema Skin: No rashes Psychiatry: Flat affect   Data Reviewed:   CBC: Recent Labs  Lab 11/21/24 1229 11/21/24 1247 11/22/24 0153 11/23/24 0230 11/24/24 0307 11/25/24 0221 11/26/24 0220  WBC 14.8*  --  21.5* 12.4* 11.1* 9.2 9.8  NEUTROABS 11.9*  --   --   --   --   --   --   HGB 12.2*   < > 10.4* 10.5* 10.8* 10.9* 11.0*  HCT 37.1*   < > 31.2* 34.0* 32.8* 33.1* 34.1*  MCV 78.8*  --  77.6* 81.7 76.8* 77.9* 78.4*  PLT 242  --  189 201 234 240 222   < > = values in this interval not displayed.   Basic Metabolic Panel: Recent Labs  Lab 11/23/24 1600 11/23/24 1958 11/24/24 0307 11/25/24 0221  11/26/24 0220  NA 134* 136 137 135 138  K 4.0 4.0 4.1 3.8 4.0  CL 96* 96* 99 97* 100  CO2 24 26 25 27 24   GLUCOSE 110* 114* 123* 183* 359*  BUN 48* 48* 46* 46* 57*  CREATININE 2.02* 1.87* 1.85* 1.74* 1.81*  CALCIUM  9.4 9.6 9.3 9.5 9.5   GFR: Estimated Creatinine Clearance: 25.7 mL/min (A) (by C-G formula based on SCr of 1.81 mg/dL (H)). Liver Function Tests: Recent Labs  Lab 11/21/24 1229  AST 19  ALT 13  ALKPHOS 84  BILITOT 1.3*  PROT 7.7  ALBUMIN  3.4*   Recent Labs  Lab 11/21/24 1229  LIPASE 23   No results for input(s): AMMONIA in the last 168 hours. Coagulation Profile: No results for input(s): INR, PROTIME in the last 168 hours. Cardiac Enzymes: No results for input(s): CKTOTAL, CKMB, CKMBINDEX, TROPONINI in the last 168 hours. BNP (last 3 results) No results for input(s): PROBNP in the last 8760 hours. HbA1C: Recent Labs    11/25/24 0221  HGBA1C 11.4*   CBG: Recent Labs  Lab 11/25/24 2038 11/25/24 2350 11/26/24 0354 11/26/24 0620 11/26/24 0756  GLUCAP 266* 282* 367* 346* 347*   Lipid Profile: No results for input(s): CHOL, HDL, LDLCALC,  TRIG, CHOLHDL, LDLDIRECT in the last 72 hours. Thyroid  Function Tests: No results for input(s): TSH, T4TOTAL, FREET4, T3FREE, THYROIDAB in the last 72 hours. Anemia Panel: No results for input(s): VITAMINB12, FOLATE, FERRITIN, TIBC, IRON, RETICCTPCT in the last 72 hours. Urine analysis:    Component Value Date/Time   COLORURINE YELLOW 11/21/2024 1229   APPEARANCEUR CLEAR 11/21/2024 1229   LABSPEC 1.013 11/21/2024 1229   PHURINE 7.0 11/21/2024 1229   GLUCOSEU >=500 (A) 11/21/2024 1229   HGBUR NEGATIVE 11/21/2024 1229   BILIRUBINUR NEGATIVE 11/21/2024 1229   KETONESUR NEGATIVE 11/21/2024 1229   PROTEINUR 100 (A) 11/21/2024 1229   UROBILINOGEN 1.0 10/04/2009 1109   NITRITE NEGATIVE 11/21/2024 1229   LEUKOCYTESUR NEGATIVE 11/21/2024 1229   Sepsis  Labs: @LABRCNTIP (procalcitonin:4,lacticidven:4)  ) Recent Results (from the past 240 hours)  Culture, blood (routine x 2)     Status: None   Collection Time: 11/21/24 12:29 PM   Specimen: BLOOD RIGHT ARM  Result Value Ref Range Status   Specimen Description BLOOD RIGHT ARM  Final   Special Requests   Final    BOTTLES DRAWN AEROBIC AND ANAEROBIC Blood Culture results may not be optimal due to an inadequate volume of blood received in culture bottles   Culture   Final    NO GROWTH 5 DAYS Performed at [facility], [address], [place], [place] [address]    Report Status 11/26/2024 FINAL  Final  Culture, blood (routine x 2)     Status: None   Collection Time: 11/21/24 12:29 PM   Specimen: BLOOD RIGHT ARM  Result Value Ref Range Status   Specimen Description BLOOD RIGHT ARM  Final   Special Requests   Final    BOTTLES DRAWN AEROBIC AND ANAEROBIC Blood Culture results may not be optimal due to an inadequate volume of blood received in culture bottles   Culture   Final    NO GROWTH 5 DAYS Performed at [facility], [address], [place], [place] [address]    Report Status 11/26/2024 FINAL  Final  Resp panel by RT-PCR (RSV, Flu A&B, Covid) Peripheral     Status: None   Collection Time: 11/21/24 12:29 PM   Specimen: Peripheral; Nasal Swab  Result Value Ref Range Status   SARS Coronavirus 2 by RT PCR NEGATIVE NEGATIVE Final   Influenza A by PCR NEGATIVE NEGATIVE Final   Influenza B by PCR NEGATIVE NEGATIVE Final    Comment: (NOTE) The Xpert Xpress SARS-CoV-2/FLU/RSV plus assay is intended as an aid in the diagnosis of influenza from Nasopharyngeal swab specimens and should not be used as a sole basis for treatment. Nasal washings and aspirates are unacceptable for Xpert Xpress SARS-CoV-2/FLU/RSV testing.  Fact Sheet for Patients: bloggercourse.com  Fact Sheet for Healthcare Providers: seriousbroker.it  This test  is not yet approved or cleared by the [place]  FDA and has been authorized for detection and/or diagnosis of SARS-CoV-2 by FDA under an Emergency Use Authorization (EUA). This EUA will remain in effect (meaning this test can be used) for the duration of the COVID-19 declaration under Section 564(b)(1) of the Act, 21 U.S.C. section 360bbb-3(b)(1), unless the authorization is terminated or revoked.     Resp Syncytial Virus by PCR NEGATIVE NEGATIVE Final    Comment: (NOTE) Fact Sheet for Patients: bloggercourse.com  Fact Sheet for Healthcare Providers: seriousbroker.it  This test is not yet approved or cleared by the [place]  FDA and has been authorized for detection and/or diagnosis of  SARS-CoV-2 by FDA under an Emergency Use Authorization (EUA). This EUA will remain in effect (meaning this test can be used) for the duration of the COVID-19 declaration under Section 564(b)(1) of the Act, 21 U.S.C. section 360bbb-3(b)(1), unless the authorization is terminated or revoked.  Performed at [facility], [address] [place], [place] [address]   MRSA Next Gen by PCR, Nasal     Status: None   Collection Time: 11/21/24  5:50 PM   Specimen: Nasal Mucosa; Nasal Swab  Result Value Ref Range Status   MRSA by PCR Next Gen NOT DETECTED NOT DETECTED Final    Comment: (NOTE) The GeneXpert MRSA Assay (FDA approved for NASAL specimens only), is one component of a comprehensive MRSA colonization surveillance program. It is not intended to diagnose MRSA infection nor to guide or monitor treatment for MRSA infections. Test performance is not FDA approved in patients less than 59 years old. Performed at [facility], [address], [place], [place] [address]      Radiology Studies: DG CHEST PORT 1 VIEW Result Date: 11/26/2024 EXAM: 1 VIEW(S) XRAY OF THE CHEST 11/26/2024 07:16:00 AM COMPARISON: Portable chest 11/21/2024.  CLINICAL HISTORY: 200808 Hypoxia 200808 Hypoxia 200808 FINDINGS: LUNGS AND PLEURA: Persisting patchy airspace disease in the right upper and left lower lung fields. In the left lower lung field, this is mildly improved. There is increased patchy haziness in the right lower lung field most likely due to a new area of pneumonia. The left upper lung field remains clear. Small pleural effusions, seen previously. No Pneumothorax. HEART AND MEDIASTINUM: The heart is enlarged. There is mild prominence in the central vessels, interval development of slight central interstitial edema. BONES AND SOFT TISSUES: Osteopenia and thoracic spondylosis. IMPRESSION: 1. Increased patchy haziness in the right lower lung field, likely representing a new area of pneumonia. 2. Persisting patchy airspace disease in the right upper and left lower lung fields, with mild improvement in the left lower lung field. 3. Enlarged heart with mild prominence in the central vessels and interval development of slight central interstitial edema. 4. Small pleural effusions, seen previously. Electronically signed by: Francis Quam MD 11/26/2024 07:32 AM EST RP Workstation: [id number]   DG Swallowing Func-Speech Pathology Result Date: 11/24/2024 Table formatting from the original result was not included. Modified Barium Swallow Study Patient Details Name: Andre Stout MRN: [id number] Date of Birth: 1940-01-16 Today's Date: 11/24/2024 HPI/PMH: HPI: Masiah Woody is an 84 y/o, chronically ill male. Recently hospitalized 11/2-7 due to failure to thrive, poor oral intake and diarrhea, noted to be in CHF exacerbation, AKI, hyponatremia and multifocal pneumonia, treated with antibiotics, diuresed, there was discussion on palliative care but not pursued.  Brought to the ED 11/22 with increased weakness and confusion, also history of worsening cough for few days, some dyspnea.  Chest x-ray noted new right upper lobe consolidation concerning for developing  pneumonia, CT chest abdomen pelvis noted large focal areas of pulmonary consolidation right upper lobe and left lower lobe with progression of right upper lobe disease and moderate left and minimal right pleural effusions improved since prior study. Patient most recently seen by SLP 08/2024 during which he was placed on a dysphagia 1, thin liquid diet. Perviously seen in July 2025 with a cognitively based dysphagia.  PMH of  type 2 diabetes mellitus, dementia,hypertension, chronic anemia, complete heart block, prostate cancer. Clinical Impression Patient presents with a mild oropharyngeal phase dysphagia per this MBS. No aspiration or penetration was  observed with any of the tested consistencies. Patient required minimal cues at times to hold attention to bolus. Oral impairments include escape of bolus progressing to mid-chin, disorganized chewing/mashing, repetitive tongue motion, oral residue lining his tongue, and delayed swallow initiation to the valleculae. Pharyngeal impairments include partial anterior movement and collection of pharyngeal residue on valleculae. Patient's swallow function looks similar to his previous MBS on 09/03/24 which also revealed an oropharyngeal dysphagia. SLP recommending Dys 1 (puree) diet with thin liquids. SLP will continue to follow for diet toleration. Factors that may increase risk of adverse event in presence of aspiration [name] & [name] 2021):   DIGEST Swallow Severity Rating*             Safety:0                       Efficiency: 1             Overall Pharyngeal Swallow Severity: 1 (mild) 1: mild; 2: moderate; 3: severe; 4: profound *The Dynamic Imaging Grade of Swallowing Toxicity is standardized for the head and neck cancer population, however, demonstrates promising clinical applications across populations to standardize the clinical rating of pharyngeal swallow safety and severity. Recommendations/Plan: Swallowing Evaluation Recommendations Swallowing Evaluation  Recommendations Recommendations: PO diet PO Diet Recommendation: Dysphagia 1 (Pureed); Thin liquids (Level 0) Liquid Administration via: Cup Medication Administration: Crushed with puree Swallowing strategies  : Slow rate; Small bites/sips; Minimize environmental distractions Postural changes: Position pt fully upright for meals; Stay upright 30-60 min after meals Oral care recommendations: Oral care BID (2x/day) Treatment Plan Treatment Plan Treatment recommendations: Therapy as outlined in treatment plan below Functional status assessment: Patient has had a recent decline in their functional status and demonstrates the ability to make significant improvements in function in a reasonable and predictable amount of time. Treatment frequency: Min 1x/week Treatment duration: 1 week Interventions: Patient/family education; Trials of upgraded texture/liquids; Diet toleration management by SLP Recommendations Recommendations for follow up therapy are one component of a multi-disciplinary discharge planning process, led by the attending physician.  Recommendations may be updated based on patient status, additional functional criteria and insurance authorization. Assessment: Orofacial Exam: Orofacial Exam Oral Cavity: Oral Hygiene: WFL Oral Cavity - Dentition: Missing dentition Orofacial Anatomy: WFL Oral Motor/Sensory Function: WFL Anatomy: Anatomy: WFL Boluses Administered: Boluses Administered Boluses Administered: Thin liquids (Level 0); Mildly thick liquids (Level 2, nectar thick); Moderately thick liquids (Level 3, honey thick); Puree; Solid  Oral Impairment Domain: Oral Impairment Domain Lip Closure: Escape progressing to mid-chin Tongue control during bolus hold: Not tested Bolus preparation/mastication: Disorganized chewing/mashing with solid pieces of bolus unchewed Bolus transport/lingual motion: Repetitive/disorganized tongue motion Oral residue: Residue collection on oral structures Location of oral residue :  Tongue Initiation of pharyngeal swallow : Valleculae  Pharyngeal Impairment Domain: Pharyngeal Impairment Domain Soft palate elevation: No bolus between soft palate (SP)/pharyngeal wall (PW) Laryngeal elevation: Complete superior movement of thyroid  cartilage with complete approximation of arytenoids to epiglottic petiole Anterior hyoid excursion: Partial anterior movement Epiglottic movement: Complete inversion Laryngeal vestibule closure: Complete, no air/contrast in laryngeal vestibule Pharyngeal stripping wave : Present - complete Pharyngeal contraction (A/P view only): N/A Pharyngoesophageal segment opening: Complete distension and complete duration, no obstruction of flow Tongue base retraction: No contrast between tongue base and posterior pharyngeal wall (PPW) Pharyngeal residue: Collection of residue within or on pharyngeal structures Location of pharyngeal residue: Valleculae  Esophageal Impairment Domain: Esophageal Impairment Domain Esophageal clearance upright position: Complete clearance, esophageal coating  Pill: No data recorded Penetration/Aspiration Scale Score: Penetration/Aspiration Scale Score 1.  Material does not enter airway: Thin liquids (Level 0); Mildly thick liquids (Level 2, nectar thick); Moderately thick liquids (Level 3, honey thick); Puree; Solid Compensatory Strategies: Compensatory Strategies Compensatory strategies: No   General Information: Caregiver present: No  Diet Prior to this Study: Regular; Thin liquids (Level 0)   Temperature : Normal   Respiratory Status: WFL   Supplemental O2: None (Room air)   History of Recent Intubation: No  Behavior/Cognition: Alert; Cooperative; Lethargic/Drowsy; Requires cueing Self-Feeding Abilities: Needs assist with self-feeding No data recorded No data recorded Volitional Swallow: Able to elicit No data recorded Goal Planning: Prognosis for improved oropharyngeal function: Fair Barriers to Reach Goals: Cognitive deficits No data recorded No  data recorded Consulted and agree with results and recommendations: Pt unable/family or caregiver not available; Nurse Pain: Pain Assessment Pain Assessment: No/denies pain End of Session: Start Time:SLP Start Time (ACUTE ONLY): 1308 Stop Time: SLP Stop Time (ACUTE ONLY): 1319 Time Calculation:SLP Time Calculation (min) (ACUTE ONLY): 11 min Charges: SLP Evaluations $ SLP Speech Visit: 1 Visit SLP Evaluations $MBS Swallow: 1 Procedure SLP visit diagnosis: No data recorded Past Medical History: Past Medical History: Diagnosis Date  CKD (chronic kidney disease) stage 3, GFR 30-59 ml/min (HCC)   Diabetes mellitus without complication (HCC)   Glaucoma   Heart block   complete heart block  History of CVA (cerebrovascular accident)   Hypertension   Prostate cancer (HCC)   Been 3-4 years ago Past Surgical History: Past Surgical History: Procedure Laterality Date  CATARACT EXTRACTION Bilateral   INSERTION PROSTATE RADIATION SEED    IR EXCHANGE BILIARY DRAIN  08/19/2023  IR EXCHANGE BILIARY DRAIN  12/19/2023  IR EXCHANGE BILIARY DRAIN  01/20/2024  IR PERC CHOLECYSTOSTOMY  08/17/2022  IR RADIOLOGIST EVAL & MGMT  09/28/2022  IR RADIOLOGIST EVAL & MGMT  10/10/2022 Norleen IVAR Blase, MA, CCC-SLP Speech Therapy     Scheduled Meds:  amoxicillin -clavulanate  1 tablet Oral Q12H   vitamin B-12  1,000 mcg Oral Daily   enoxaparin  (LOVENOX ) injection  30 mg Subcutaneous Q24H   insulin  aspart  0-15 Units Subcutaneous TID WC   insulin  glargine-yfgn  25 Units Subcutaneous Daily   sodium chloride  flush  3 mL Intravenous Q12H   torsemide   20 mg Oral Daily   Continuous Infusions:     LOS: 5 days    Time spent:    Sigurd Pac, MD [facility]   11/26/2024, 10:31 AM

## 2024-11-26 NOTE — Plan of Care (Signed)
  Problem: Education: Goal: Knowledge of General Education information will improve Description: Including pain rating scale, medication(s)/side effects and non-pharmacologic comfort measures Outcome: Not Progressing   Problem: Health Behavior/Discharge Planning: Goal: Ability to manage health-related needs will improve Outcome: Not Progressing   Problem: Education: Goal: Knowledge of General Education information will improve Description: Including pain rating scale, medication(s)/side effects and non-pharmacologic comfort measures Outcome: Not Progressing   Problem: Health Behavior/Discharge Planning: Goal: Ability to manage health-related needs will improve Outcome: Not Progressing

## 2024-11-26 NOTE — Plan of Care (Signed)
 Patient has been somnolent most of the day, arousable by voice, has eaten just 25% of meals, doesn't seem inspired to eat more. Blood sugars have been well controlled in 150s  after AM blood sugar of 350 range, and 25 Units SEMGLEE .

## 2024-11-27 ENCOUNTER — Other Ambulatory Visit: Payer: Self-pay

## 2024-11-27 DIAGNOSIS — J189 Pneumonia, unspecified organism: Secondary | ICD-10-CM | POA: Diagnosis not present

## 2024-11-27 DIAGNOSIS — A419 Sepsis, unspecified organism: Secondary | ICD-10-CM | POA: Diagnosis not present

## 2024-11-27 LAB — BASIC METABOLIC PANEL WITH GFR
Anion gap: 12 (ref 5–15)
BUN: 57 mg/dL — ABNORMAL HIGH (ref 8–23)
CO2: 27 mmol/L (ref 22–32)
Calcium: 9.7 mg/dL (ref 8.9–10.3)
Chloride: 104 mmol/L (ref 98–111)
Creatinine, Ser: 1.67 mg/dL — ABNORMAL HIGH (ref 0.61–1.24)
GFR, Estimated: 40 mL/min — ABNORMAL LOW (ref >60)
Glucose, Bld: 200 mg/dL — ABNORMAL HIGH (ref 70–99)
Potassium: 3.5 mmol/L (ref 3.5–5.1)
Sodium: 143 mmol/L (ref 135–145)

## 2024-11-27 LAB — CBC
HCT: 35.2 % — ABNORMAL LOW (ref 39.0–52.0)
Hemoglobin: 11.4 g/dL — ABNORMAL LOW (ref 13.0–17.0)
MCH: 25.6 pg — ABNORMAL LOW (ref 26.0–34.0)
MCHC: 32.4 g/dL (ref 30.0–36.0)
MCV: 78.9 fL — ABNORMAL LOW (ref 80.0–100.0)
Platelets: 257 K/uL (ref 150–400)
RBC: 4.46 MIL/uL (ref 4.22–5.81)
RDW: 17.6 % — ABNORMAL HIGH (ref 11.5–15.5)
WBC: 10.3 K/uL (ref 4.0–10.5)
nRBC: 0.4 % — ABNORMAL HIGH (ref 0.0–0.2)

## 2024-11-27 LAB — GLUCOSE, CAPILLARY
Glucose-Capillary: 224 mg/dL — ABNORMAL HIGH (ref 70–99)
Glucose-Capillary: 113 mg/dL — ABNORMAL HIGH (ref 70–99)
Glucose-Capillary: 105 mg/dL — ABNORMAL HIGH (ref 70–99)
Glucose-Capillary: 216 mg/dL — ABNORMAL HIGH (ref 70–99)

## 2024-11-27 NOTE — Progress Notes (Signed)
 Daily Progress Note   Patient Name: Andre Stout       Date: 11/27/2024 DOB: [birth year]  Age: 84 y.o. MRN#: [id number] Attending Physician: Andre Frames, MD Primary Care Physician: Pcp, No Admit Date: 11/21/2024  Reason for Consultation/Follow-up: Establishing goals of care  Subjective: Patient tells me he is doing fair. No specific complaints, denies pain and shortness of breath. Able to tell me he is in the hospital but doesn't know why. No family at bedside during my visit.   Length of Stay: 6  Current Medications: Scheduled Meds:   amoxicillin -clavulanate  1 tablet Oral Q12H   vitamin B-12  1,000 mcg Oral Daily   enoxaparin  (LOVENOX ) injection  30 mg Subcutaneous Q24H   insulin  aspart  0-15 Units Subcutaneous TID WC   insulin  glargine-yfgn  25 Units Subcutaneous Daily   sodium chloride  flush  3 mL Intravenous Q12H   torsemide   20 mg Oral Daily    Continuous Infusions:   PRN Meds: acetaminophen  **OR** acetaminophen , dextrose , ipratropium-albuterol , ondansetron  **OR** ondansetron  (ZOFRAN ) IV  Physical Exam Constitutional:      General: He is not in acute distress. Pulmonary:     Effort: Pulmonary effort is normal.  Skin:    General: Skin is warm and dry.  Neurological:     Mental Status: He is alert. He is disoriented.     Comments: Oriented to self and place, disoriented to situation             Vital Signs: BP (!) 99/50 (BP Location: Right Arm)   Pulse (!) 55   Temp (!) 97.5 F (36.4 C) (Oral)   Resp 16   Ht 5' 5 (1.651 m)   Wt 55.4 kg   SpO2 91%   BMI 20.32 kg/m  SpO2: SpO2: 91 % O2 Device: O2 Device: Room Air O2 Flow Rate: O2 Flow Rate (L/min): 2 L/min  Intake/output summary:  Intake/Output Summary (Last 24 hours) at 11/27/2024 1507 Last data filed at  11/27/2024 1135 Gross per 24 hour  Intake --  Output 1900 ml  Net -1900 ml   LBM: Last BM Date : 11/25/24 Baseline Weight: Weight: 60.1 kg Most recent weight: Weight: 55.4 kg       Palliative Assessment/Data: PPS 30%      Patient Active Problem List   Diagnosis Date Noted   Multifocal pneumonia 11/21/2024   Cognitive impairment 11/21/2024   Non-ST elevation (NSTEMI) myocardial infarction (HCC) 11/02/2024   CHF (congestive heart failure) (HCC) 11/02/2024   ARF (acute renal failure) 11/01/2024   Elevated troponin 11/01/2024   Diarrhea 11/01/2024   Uncontrolled type 2 diabetes mellitus with hyperglycemia (HCC) 11/01/2024   Pleural effusion due to CHF (congestive heart failure) (HCC) 11/01/2024   Pneumonia 09/02/2024   Nausea and vomiting 09/02/2024   Uncontrolled diabetes mellitus with hypoglycemia (HCC) 09/02/2024   Acute metabolic encephalopathy 09/02/2024   Hyperkalemia 09/02/2024   Microcytic anemia 09/02/2024   Moderate malnutrition 07/25/2024   L1 vertebral fracture (HCC) 07/22/2024   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   Sepsis due to pneumonia (HCC) 07/21/2024   Aspiration pneumonia (HCC) 07/21/2024   Acute respiratory failure with hypoxia (HCC) 07/21/2024   CKD (chronic kidney disease) stage 3, GFR  30-59 ml/min (HCC) 07/21/2024   Subdural hematoma (HCC) 07/21/2024   Irritant contact dermatitis associated with stoma 03/13/2024   Persistent postoperative fistula 03/13/2024   GERD without esophagitis 03/05/2024   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   History of complete heart block 03/05/2024   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   Malnutrition of moderate degree 01/20/2024   Hyperlipidemia 01/17/2024   Leukocytosis 01/17/2024   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 (HCC) 01/17/2024   Acute encephalopathy 01/17/2024   fracture of distal diaphyseal metaphyseal junction of the 5th left metacarpal with dorsal angulation  01/17/2024   Cholecystostomy care (HCC) 01/17/2024   Migration of percutaneous cholecystostomy tube 08/18/2023   Hypertension associated with diabetes (HCC) 08/18/2023   History of stroke 08/18/2023   Mixed diabetic hyperlipidemia associated with type 2 diabetes mellitus (HCC) 08/18/2023   TIA (transient ischemic attack) 10/15/2022   Odynophagia 08/21/2022   Hyponatremia 08/20/2022   Obesity (BMI 30-39.9) 08/20/2022   Myocardial injury 08/20/2022   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   Seizure (HCC) 01/02/2022   Prolonged QT interval 01/02/2022   Syncope and collapse 10/09/2020   Prostate cancer (HCC) 03/25/2019   Hypophosphatemia 03/06/2016   Low magnesium  level 03/06/2016   Benign essential HTN 03/05/2016   AKI (acute kidney injury) 03/04/2016    Palliative Care Assessment & Plan   HPI: 84 y.o. male  with past medical history of dementia, T2DM, history of prostate cancer, chronic anemia admitted on 11/21/2024 with pneumonia and acute metabolic encephalopathy.    Patient and family previously met with palliative during prior hospitalization 7/22-7/31 and family expressed that patient was not ready for hospice discussion at that time. Plan was for outpatient palliative following as an outpatient but daughter confirmed that was never set up.    Per H&P on 11/22 by Claudene MD, initial work up significant for elevated BNP 1240 concerning for congestive heart failure exacerbation. CXR showed new right upper lobe consolidation consistent with pneumonia. CT chest showed pulmonary consolidation in right upper lobe.    11/26 recurrent respiratory distress, concern for aspiration again, repeat CXR w/ worsening Pneumonia. Continues with significant failure to thrive, minimal p.o. intake, ongoing aspiration. 11/27 nurses note aspiration with all p.o.'s.   Palliative medicine consulted for goals of care conversation.   Assessment: Follow up today at request of Dr. Fairy.  RN reports  aspiration with all PO intake, even modified diet. Today, meds and food have been held d/t concerns of aspiration.   Went to bedside - patient not in distress. No oxygen needed. Reports no complaints. He is able to tell me he is in the hospital but doesn't know why. When I talk about his pneumonia and trouble swallowing he seems surprised. All questions answered. No family present during my visit.  Call to daughter Andre Stout. She reviews with me that she spoke with Dr. Fairy this morning and feels she has a good understand of the situation. She tells me she feels overwhelmed with the information because she feels there were some things going on in previous hospitalization that she wasn't aware of and needs time to process how sick the patient is.   We discuss the most pressing issue at this point is his ongoing aspiration. She tells me she understands this.  She tells me she needs the weekend to process what she has heard and take some time to talk to other family members about the situation.   She was agreeable to PMT following  up with her on Monday.   Recommendations/Plan: DNR/DNI Ongoing discussions - family seems to have good understanding of situation but feeling overwhelmed and requesting some time to process the situation before making any further decisions Family agrees to follow up with PMT on Monday  Code Status: DNR  Care plan was discussed with Dr Andre and patient's daughter Andre Stout  Thank you for allowing the Palliative Medicine Team to assist in the care of this patient.   Total Time 40 minutes Prolonged Time Billed  no   Time spent includes: Detailed review of medical records (labs, imaging, vital signs), medically appropriate exam, discussion with treatment team, counseling and educating patient, family and/or staff, documenting clinical information, medication management and coordination of care.     *Please note that this is a verbal dictation therefore any  spelling or grammatical errors are due to the Dragon Medical One system interpretation.  Tobey Jama Barnacle, DNP, [facility] Palliative Medicine Team Team Phone # [phone]  Pager [phone]

## 2024-11-27 NOTE — Progress Notes (Signed)
 PT Cancellation Note  Patient Details Name: DETROIT FRIEDEN MRN: [id number] DOB: 1940/10/31   Cancelled Treatment:    Reason Eval/Treat Not Completed: Patient's level of consciousness;Other (comment) (Holding per MD request given pt's recent change of status and level of arousal. Discussion of going on comfort care.)  Leontine Hilt DPT Acute Rehab Services [phone] Prefer contact via chat   Leontine KATHEE Hilt 11/27/2024, 9:34 AM

## 2024-11-27 NOTE — Progress Notes (Signed)
 PROGRESS NOTE    Andre Stout  FMW:[id number] DOB: 10/11/1940 DOA: 11/21/2024 PCP: Pcp, No  84/M chronically ill with Dementia, Type 2 diabetes mellitus, hypertension, chronic anemia, complete heart block, prostate cancer, recently hospitalized 11/2-7 presented with failure to thrive, poor oral intake and diarrhea, noted to be in CHF exacerbation, AKI, hyponatremia and multifocal pneumonia, treated with antibiotics, diuresed, there was discussion on palliative care but not pursued.  Brought to the ED 11/22 with increased weakness and confusion, also history of worsening cough for few days, some dyspnea.  In the ED afebrile, vital stable, CBG over 300, WBC 14.8, lactate 4.9, BNP 1240, troponin 23, creatinine 1.6, chest x-ray noted new right upper lobe consolidation concerning for developing pneumonia, CT chest abdomen pelvis noted large focal areas of pulmonary consolidation right upper lobe and left lower lobe with progression of right upper lobe disease and moderate left and minimal right pleural effusions improved since prior study. - Admitted, started on antibiotics, SLP eval ongoing - 11/24, labs concerning for DKA, CBGs in the 800 range - Palliative consult, remains DNR, patient was reluctant to consider hospice - 11/26 recurrent respiratory distress, concern for aspiration again, repeat CXR w/ worsening Pneumonia - Significant failure to thrive, minimal p.o. intake, ongoing aspiration - 11/27, nurses note aspiration with all p.o.'s   Subjective: - Overnight was fed by family, ongoing concern for aspiration  Assessment and Plan:  Severe sepsis secondary to pneumonia Dysphagia Patient presented with reports of increased weakness with reports of increasing cough over the last 2 days.  Found to be tachypneic with white blood cell count elevated at 14.8 and initial lactic acid 4.9.  CT chest abdomen pelvis revealed concern for large focal areas of pulmonary consolidation in the right upper  lobe and left lower lobe with progression of the right upper lobe consolidation.  Review of records note previous speech therapy evaluation recommended dysphagia II diet - Treated with IV Zosyn  initially and then switched to oral Augmentin  - SLP following, MBS completed, noted to have oropharyngeal dysphagia, - Blood cultures negative thus far -Pulmonary toilet, add DuoNebs, Mucinex  Has significant chronic disease burden, , repeat x-ray overnight with worsening pneumonia, called and discussed situation with daughter with worsening aspiration, failure to thrive, lethargy, minimal oral intake,, I recommended comfort care yesterday, she needed more time to discuss, called and recommended comfort care again this morning  DKA Type 2 diabetes mellitus - Insulin  was held on admission as he was hypoglycemic recent admission, developed significant hyperglycemia and DKA yesterday was getting excessive amounts of Glucerna - Now off insulin  gtt., continue glargine, add SSI  Dementia -memory and cognitive decline for 3years  Intermittent complete heart block -Followed by cardiology as recent as few weeks ago, patient declined PPM, also felt to be a poor candidate for invasive management   Chronic combined CHF - Last echo few weeks ago in 11/25 with a EF 35-40% with wall motion abnormality, mildly reduced RV  -EF noted to be depressed at 35% per last echocardiogram on 11/3.  Seen by cards, on account of numerous comorbidities no further invasive evaluation was recommended. - Now on low-dose torsemide    Acute metabolic encephalopathy Cognitive impairment Patient noted to be acutely altered and unable to recognize family or location.  At baseline reportedly is able to recognize family and location although normally does not know the year.  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 -Baseline appears to be around 1.6, had worsening AKI earlier this admission, now improving   Microcytic anemia Stable     DVT prophylaxis: Lovenox  Code Status: DNR Family Communication: No family at bedside, called and updated daughter again this morning Disposition Plan: TBD, needs home with hospice or residential hospice  Consultants:    Procedures:   Antimicrobials:    Objective: Vitals:   11/27/24 0047 11/27/24 0416 11/27/24 0504 11/27/24 0719  BP: 108/63 (!) 111/56  110/64  Pulse: 64 61 66 64  Resp: (!) 24 18 19 14   Temp: 97.7 F (36.5 C) 97.6 F (36.4 C)  97.7 F (36.5 C)  TempSrc: Axillary Oral  Oral  SpO2: 90% 100% 95% 100%  Weight:   55.4 kg   Height:        Intake/Output Summary (Last 24 hours) at 11/27/2024 1045 Last data filed at 11/27/2024 0048 Gross per 24 hour  Intake 50 ml  Output 1400 ml  Net -1350 ml   Filed Weights   11/24/24 0501 11/25/24 0349 11/27/24 0504  Weight: 57.5 kg 59.7 kg 55.4 kg    Examination:  General exam: Chronically ill, very poor historian, awake alert, oriented to self only, moderate cognitive deficits Respiratory system: Scattered rhonchi, decreased breath sounds at the bases Cardiovascular system: S1 & S2 heard, RRR.  Abd: nondistended, soft and nontender.Normal bowel sounds heard. Central nervous system: Awake alert, oriented to self only Extremities: no edema Skin: No rashes Psychiatry: Flat affect   Data Reviewed:   CBC: Recent Labs  Lab 11/21/24 1229 11/21/24 1247 11/23/24 0230 11/24/24 0307 11/25/24 0221 11/26/24 0220 11/27/24 0248  WBC 14.8*   < > 12.4* 11.1* 9.2 9.8 10.3  NEUTROABS 11.9*  --   --   --   --   --   --   HGB 12.2*   < > 10.5* 10.8* 10.9* 11.0* 11.4*  HCT 37.1*   < > 34.0* 32.8* 33.1* 34.1* 35.2*  MCV 78.8*   < > 81.7 76.8* 77.9* 78.4* 78.9*  PLT 242   < > 201 234 240 222 257   < > = values in this interval not displayed.   Basic  Metabolic Panel: Recent Labs  Lab 11/23/24 1958 11/24/24 0307 11/25/24 0221 11/26/24 0220 11/27/24 0248  NA 136 137 135 138 143  K 4.0 4.1 3.8 4.0 3.5  CL 96* 99 97* 100 104  CO2 26 25 27 24 27   GLUCOSE 114* 123* 183* 359* 200*  BUN 48* 46* 46* 57* 57*  CREATININE 1.87* 1.85* 1.74* 1.81* 1.67*  CALCIUM  9.6 9.3 9.5 9.5 9.7   GFR: Estimated Creatinine Clearance: 25.8 mL/min (A) (by C-G formula based on SCr of 1.67 mg/dL (H)). Liver Function Tests: Recent Labs  Lab 11/21/24 1229  AST 19  ALT 13  ALKPHOS 84  BILITOT 1.3*  PROT 7.7  ALBUMIN  3.4*   Recent Labs  Lab 11/21/24 1229  LIPASE 23   No results for input(s): AMMONIA in the last 168 hours. Coagulation Profile: No results for input(s): INR, PROTIME in the last 168 hours. Cardiac Enzymes: No results for input(s): CKTOTAL, CKMB, CKMBINDEX, TROPONINI in the last 168 hours. BNP (last 3 results) No results for input(s): PROBNP in the last 8760 hours. HbA1C: Recent Labs    11/25/24 0221  HGBA1C 11.4*   CBG: Recent Labs  Lab 11/26/24 0756 11/26/24 1133 11/26/24 1547 11/26/24 2000 11/27/24 0617  GLUCAP 347*  159* 138* 199* 224*   Lipid Profile: No results for input(s): CHOL, HDL, LDLCALC, TRIG, CHOLHDL, LDLDIRECT in the last 72 hours. Thyroid  Function Tests: No results for input(s): TSH, T4TOTAL, FREET4, T3FREE, THYROIDAB in the last 72 hours. Anemia Panel: No results for input(s): VITAMINB12, FOLATE, FERRITIN, TIBC, IRON, RETICCTPCT in the last 72 hours. Urine analysis:    Component Value Date/Time   COLORURINE YELLOW 11/21/2024 1229   APPEARANCEUR CLEAR 11/21/2024 1229   LABSPEC 1.013 11/21/2024 1229   PHURINE 7.0 11/21/2024 1229   GLUCOSEU >=500 (A) 11/21/2024 1229   HGBUR NEGATIVE 11/21/2024 1229   BILIRUBINUR NEGATIVE 11/21/2024 1229   KETONESUR NEGATIVE 11/21/2024 1229   PROTEINUR 100 (A) 11/21/2024 1229   UROBILINOGEN 1.0 10/04/2009 1109    NITRITE NEGATIVE 11/21/2024 1229   LEUKOCYTESUR NEGATIVE 11/21/2024 1229   Sepsis Labs: @LABRCNTIP (procalcitonin:4,lacticidven:4)  ) Recent Results (from the past 240 hours)  Culture, blood (routine x 2)     Status: None   Collection Time: 11/21/24 12:29 PM   Specimen: BLOOD RIGHT ARM  Result Value Ref Range Status   Specimen Description BLOOD RIGHT ARM  Final   Special Requests   Final    BOTTLES DRAWN AEROBIC AND ANAEROBIC Blood Culture results may not be optimal due to an inadequate volume of blood received in culture bottles   Culture   Final    NO GROWTH 5 DAYS Performed at [facility], [address], [place], [place] [address]    Report Status 11/26/2024 FINAL  Final  Culture, blood (routine x 2)     Status: None   Collection Time: 11/21/24 12:29 PM   Specimen: BLOOD RIGHT ARM  Result Value Ref Range Status   Specimen Description BLOOD RIGHT ARM  Final   Special Requests   Final    BOTTLES DRAWN AEROBIC AND ANAEROBIC Blood Culture results may not be optimal due to an inadequate volume of blood received in culture bottles   Culture   Final    NO GROWTH 5 DAYS Performed at [facility], [address] [place], [place] [address]    Report Status 11/26/2024 FINAL  Final  Resp panel by RT-PCR (RSV, Flu A&B, Covid) Peripheral     Status: None   Collection Time: 11/21/24 12:29 PM   Specimen: Peripheral; Nasal Swab  Result Value Ref Range Status   SARS Coronavirus 2 by RT PCR NEGATIVE NEGATIVE Final   Influenza A by PCR NEGATIVE NEGATIVE Final   Influenza B by PCR NEGATIVE NEGATIVE Final    Comment: (NOTE) The Xpert Xpress SARS-CoV-2/FLU/RSV plus assay is intended as an aid in the diagnosis of influenza from Nasopharyngeal swab specimens and should not be used as a sole basis for treatment. Nasal washings and aspirates are unacceptable for Xpert Xpress SARS-CoV-2/FLU/RSV testing.  Fact Sheet for Patients: bloggercourse.com  Fact  Sheet for Healthcare Providers: seriousbroker.it  This test is not yet approved or cleared by the [place]  FDA and has been authorized for detection and/or diagnosis of SARS-CoV-2 by FDA under an Emergency Use Authorization (EUA). This EUA will remain in effect (meaning this test can be used) for the duration of the COVID-19 declaration under Section 564(b)(1) of the Act, 21 U.S.C. section 360bbb-3(b)(1), unless the authorization is terminated or revoked.     Resp Syncytial Virus by PCR NEGATIVE NEGATIVE Final    Comment: (NOTE) Fact Sheet for Patients: bloggercourse.com  Fact Sheet for Healthcare Providers: seriousbroker.it  This test is not yet approved or  cleared by the [place]  FDA and has been authorized for detection and/or diagnosis of SARS-CoV-2 by FDA under an Emergency Use Authorization (EUA). This EUA will remain in effect (meaning this test can be used) for the duration of the COVID-19 declaration under Section 564(b)(1) of the Act, 21 U.S.C. section 360bbb-3(b)(1), unless the authorization is terminated or revoked.  Performed at [facility], [address], [place], [place] [address]   MRSA Next Gen by PCR, Nasal     Status: None   Collection Time: 11/21/24  5:50 PM   Specimen: Nasal Mucosa; Nasal Swab  Result Value Ref Range Status   MRSA by PCR Next Gen NOT DETECTED NOT DETECTED Final    Comment: (NOTE) The GeneXpert MRSA Assay (FDA approved for NASAL specimens only), is one component of a comprehensive MRSA colonization surveillance program. It is not intended to diagnose MRSA infection nor to guide or monitor treatment for MRSA infections. Test performance is not FDA approved in patients less than 72 years old. Performed at [facility], [address] [place], [place] [address]      Radiology Studies: DG CHEST PORT 1 VIEW Result Date: 11/26/2024 EXAM: 1  VIEW(S) XRAY OF THE CHEST 11/26/2024 07:16:00 AM COMPARISON: Portable chest 11/21/2024. CLINICAL HISTORY: 200808 Hypoxia 200808 Hypoxia 200808 FINDINGS: LUNGS AND PLEURA: Persisting patchy airspace disease in the right upper and left lower lung fields. In the left lower lung field, this is mildly improved. There is increased patchy haziness in the right lower lung field most likely due to a new area of pneumonia. The left upper lung field remains clear. Small pleural effusions, seen previously. No Pneumothorax. HEART AND MEDIASTINUM: The heart is enlarged. There is mild prominence in the central vessels, interval development of slight central interstitial edema. BONES AND SOFT TISSUES: Osteopenia and thoracic spondylosis. IMPRESSION: 1. Increased patchy haziness in the right lower lung field, likely representing a new area of pneumonia. 2. Persisting patchy airspace disease in the right upper and left lower lung fields, with mild improvement in the left lower lung field. 3. Enlarged heart with mild prominence in the central vessels and interval development of slight central interstitial edema. 4. Small pleural effusions, seen previously. Electronically signed by: Francis Quam MD 11/26/2024 07:32 AM EST RP Workstation: [id number]     Scheduled Meds:  amoxicillin -clavulanate  1 tablet Oral Q12H   vitamin B-12  1,000 mcg Oral Daily   enoxaparin  (LOVENOX ) injection  30 mg Subcutaneous Q24H   insulin  aspart  0-15 Units Subcutaneous TID WC   insulin  glargine-yfgn  25 Units Subcutaneous Daily   sodium chloride  flush  3 mL Intravenous Q12H   torsemide   20 mg Oral Daily   Continuous Infusions:     LOS: 6 days    Time spent:    Sigurd Pac, MD [facility]   11/27/2024, 10:45 AM

## 2024-11-27 NOTE — TOC Progression Note (Addendum)
 Transition of Care [facility]) - Progression Note    Patient Details  Name: Andre Stout MRN: [id number] Date of Birth: [birth year]  [facility] CM/SW Contact  Isaiah Public, LCSWA Phone Number: 11/27/2024, 4:35 PM  Clinical Narrative:     CSW LVM for patients daughter Michaelle Amble. CSW awaiting call back to follow up on SNF choice. CSW will continue to follow.  Expected Discharge Plan: Skilled Nursing Facility Barriers to Discharge: Continued Medical Work up, SNF Pending bed offer, English As A Second Language Teacher               Expected Discharge Plan and Services In-house Referral: Clinical Social Work   Post Acute Care Choice: Skilled Nursing Facility Living arrangements for the past 2 months: Single Family Home                 DME Arranged: N/A DME Agency: NA       HH Arranged: NA HH Agency: NA         Social Drivers of Health (SDOH) Interventions SDOH Screenings   Food Insecurity: No Food Insecurity (11/03/2024)  Housing: Low Risk  (11/03/2024)  Transportation Needs: Patient Unable To Answer (11/03/2024)  Utilities: Patient Unable To Answer (11/03/2024)  Alcohol Screen: Low Risk  (07/24/2023)  Depression (PHQ2-9): Low Risk  (07/24/2023)  Financial Resource Strain: Low Risk  (10/26/2024)  Physical Activity: Inactive (10/26/2024)  Social Connections: Unknown (11/03/2024)  Recent Concern: Social Connections - Socially Isolated (10/26/2024)  Stress: No Stress Concern Present (10/26/2024)  Tobacco Use: Medium Risk (11/21/2024)  Health Literacy: Adequate Health Literacy (07/24/2023)    Readmission Risk Interventions    11/06/2024   10:49 AM 08/15/2022    9:25 AM  Readmission Risk Prevention Plan  Transportation Screening Complete Complete  PCP or Specialist Appt within 5-7 Days  Complete  Home Care Screening  Complete  Medication Review (RN CM)  Complete  Medication Review (RN Care Manager) Complete   PCP or Specialist appointment within 3-5 days of  discharge Complete   HRI or Home Care Consult Complete   Palliative Care Screening Not Applicable   Skilled Nursing Facility Not Applicable

## 2024-11-28 DIAGNOSIS — Z515 Encounter for palliative care: Secondary | ICD-10-CM | POA: Diagnosis not present

## 2024-11-28 DIAGNOSIS — R1310 Dysphagia, unspecified: Secondary | ICD-10-CM | POA: Diagnosis not present

## 2024-11-28 DIAGNOSIS — Z66 Do not resuscitate: Secondary | ICD-10-CM

## 2024-11-28 DIAGNOSIS — J189 Pneumonia, unspecified organism: Secondary | ICD-10-CM | POA: Diagnosis not present

## 2024-11-28 DIAGNOSIS — Z7189 Other specified counseling: Secondary | ICD-10-CM | POA: Diagnosis not present

## 2024-11-28 LAB — BASIC METABOLIC PANEL WITH GFR
Anion gap: 13 (ref 5–15)
BUN: 57 mg/dL — ABNORMAL HIGH (ref 8–23)
CO2: 27 mmol/L (ref 22–32)
Calcium: 9.5 mg/dL (ref 8.9–10.3)
Chloride: 104 mmol/L (ref 98–111)
Creatinine, Ser: 1.52 mg/dL — ABNORMAL HIGH (ref 0.61–1.24)
GFR, Estimated: 45 mL/min — ABNORMAL LOW (ref >60)
Glucose, Bld: 369 mg/dL — ABNORMAL HIGH (ref 70–99)
Potassium: 3.7 mmol/L (ref 3.5–5.1)
Sodium: 144 mmol/L (ref 135–145)

## 2024-11-28 LAB — GLUCOSE, CAPILLARY
Glucose-Capillary: 318 mg/dL — ABNORMAL HIGH (ref 70–99)
Glucose-Capillary: 138 mg/dL — ABNORMAL HIGH (ref 70–99)
Glucose-Capillary: 138 mg/dL — ABNORMAL HIGH (ref 70–99)
Glucose-Capillary: 235 mg/dL — ABNORMAL HIGH (ref 70–99)

## 2024-11-28 LAB — CBC
HCT: 36.4 % — ABNORMAL LOW (ref 39.0–52.0)
Hemoglobin: 11.5 g/dL — ABNORMAL LOW (ref 13.0–17.0)
MCH: 25.5 pg — ABNORMAL LOW (ref 26.0–34.0)
MCHC: 31.6 g/dL (ref 30.0–36.0)
MCV: 80.7 fL (ref 80.0–100.0)
Platelets: 280 K/uL (ref 150–400)
RBC: 4.51 MIL/uL (ref 4.22–5.81)
RDW: 18 % — ABNORMAL HIGH (ref 11.5–15.5)
WBC: 7.8 K/uL (ref 4.0–10.5)
nRBC: 0.4 % — ABNORMAL HIGH (ref 0.0–0.2)

## 2024-11-28 NOTE — Plan of Care (Signed)

## 2024-11-28 NOTE — TOC Progression Note (Signed)
 Transition of Care [facility]) - Progression Note    Patient Details  Name: Andre Stout MRN: [id number] Date of Birth: 1940-01-11  Transition of Care [facility]) CM/SW Contact  Isaiah Public, LCSWA Phone Number: 11/28/2024, 2:04 PM  Clinical Narrative:     CSW spoke with patients daughter Michaelle Amble who informed CSW that Dc plan TBD,not interested in rehab anymore. Palliative is following.  All questions answered. No further questions reported at this time.TOC will continue to follow.  Expected Discharge Plan: Skilled Nursing Facility Barriers to Discharge: Continued Medical Work up, SNF Pending bed offer, English As A Second Language Teacher               Expected Discharge Plan and Services In-house Referral: Clinical Social Work   Post Acute Care Choice: Skilled Nursing Facility Living arrangements for the past 2 months: Single Family Home                 DME Arranged: N/A DME Agency: NA       HH Arranged: NA HH Agency: NA         Social Drivers of Health (SDOH) Interventions SDOH Screenings   Food Insecurity: No Food Insecurity (11/03/2024)  Housing: Low Risk  (11/03/2024)  Transportation Needs: Patient Unable To Answer (11/03/2024)  Utilities: Patient Unable To Answer (11/03/2024)  Alcohol Screen: Low Risk  (07/24/2023)  Depression (PHQ2-9): Low Risk  (07/24/2023)  Financial Resource Strain: Low Risk  (10/26/2024)  Physical Activity: Inactive (10/26/2024)  Social Connections: Unknown (11/03/2024)  Recent Concern: Social Connections - Socially Isolated (10/26/2024)  Stress: No Stress Concern Present (10/26/2024)  Tobacco Use: Medium Risk (11/21/2024)  Health Literacy: Adequate Health Literacy (07/24/2023)    Readmission Risk Interventions    11/06/2024   10:49 AM 08/15/2022    9:25 AM  Readmission Risk Prevention Plan  Transportation Screening Complete Complete  PCP or Specialist Appt within 5-7 Days  Complete  Home Care Screening  Complete  Medication Review (RN CM)   Complete  Medication Review (RN Care Manager) Complete   PCP or Specialist appointment within 3-5 days of discharge Complete   HRI or Home Care Consult Complete   Palliative Care Screening Not Applicable   Skilled Nursing Facility Not Applicable

## 2024-11-28 NOTE — Progress Notes (Signed)
   11/28/24 2256  BiPAP/CPAP/SIPAP  BiPAP/CPAP/SIPAP Pt Type Adult  BiPAP/CPAP/SIPAP Resmed  Reason BIPAP/CPAP not in use Non-compliant

## 2024-11-28 NOTE — Progress Notes (Signed)
 PROGRESS NOTE    Andre Stout  FMW:[id number] DOB: 03/31/1940 DOA: 11/21/2024 PCP: Pcp, No  84/M chronically ill with Dementia, Type 2 diabetes mellitus, hypertension, chronic anemia, complete heart block, prostate cancer, recently hospitalized 11/2-7 presented with failure to thrive, poor oral intake and diarrhea, noted to be in CHF exacerbation, AKI, hyponatremia and multifocal pneumonia, treated with antibiotics, diuresed, there was discussion on palliative care but not pursued.  Brought to the ED 11/22 with increased weakness and confusion, also history of worsening cough for few days, some dyspnea.  In the ED afebrile, vital stable, CBG over 300, WBC 14.8, lactate 4.9, BNP 1240, troponin 23, creatinine 1.6, chest x-ray noted new right upper lobe consolidation concerning for developing pneumonia, CT chest abdomen pelvis noted large focal areas of pulmonary consolidation right upper lobe and left lower lobe with progression of right upper lobe disease and moderate left and minimal right pleural effusions improved since prior study. - Admitted, started on antibiotics, SLP eval ongoing - 11/24, labs concerning for DKA, CBGs in the 800 range - Palliative consult, remains DNR, patient was reluctant to consider hospice - 11/26 recurrent respiratory distress, concern for aspiration again, repeat CXR w/ worsening Pneumonia - Significant failure to thrive, minimal p.o. intake, ongoing aspiration - 11/27, overnight with respiratory distress, per staff aspiration with all p.o.'s> called daughter and recommended comfort care -11/28: Continued failure to thrive, call daughter again, palliative reconsulted   Subjective: - Minimal p.o. intake, concern for aspiration with all p.o.'s per staff  Assessment and Plan:  Severe sepsis secondary to pneumonia Dysphagia Patient presented with reports of increased weakness with reports of increasing cough over the last 2 days.  Found to be tachypneic with white  blood cell count elevated at 14.8 and initial lactic acid 4.9.  CT chest abdomen pelvis revealed concern for large focal areas of pulmonary consolidation in the right upper lobe and left lower lobe with progression of the right upper lobe consolidation.  Review of records note previous speech therapy evaluation recommended dysphagia II diet - Treated with IV Zosyn  initially and then switched to oral Augmentin  - SLP following, MBS completed, noted to have oropharyngeal dysphagia, - Blood cultures negative thus far -Pulmonary toilet, add DuoNebs, Mucinex  Has significant chronic disease burden, , repeat x-ray overnight with worsening pneumonia, called and discussed situation with daughter with worsening aspiration, failure to thrive, lethargy, minimal oral intake,, I recommended comfort care 11/27 and 11/28 , palliative reconsulted, family to discuss this over the weekend  DKA Type 2 diabetes mellitus - Insulin  was held on admission as he was hypoglycemic recent admission, developed significant hyperglycemia and DKA earlier this admission was getting excessive amounts of Glucerna - Now off insulin  gtt., continue glargine, add SSI  Dementia -memory and cognitive decline for 3years  Intermittent complete heart block -Followed by cardiology as recent as few weeks ago, patient declined PPM, also felt to be a poor candidate for invasive management   Chronic combined CHF - Last echo few weeks ago in 11/25 with a EF 35-40% with wall motion abnormality, mildly reduced RV  -EF noted to be depressed at 35% per last echocardiogram on 11/3.  Seen by cards, on account of numerous comorbidities no further invasive evaluation was recommended. - Now on low-dose torsemide    Acute metabolic encephalopathy Cognitive impairment Patient noted to be acutely altered and unable to recognize family or location.  At baseline reportedly is able to recognize family and location although normally does not know the year.  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 -Baseline appears to be around 1.6, had worsening AKI earlier this admission, now improving   Microcytic anemia Stable     DVT prophylaxis: Lovenox  Code Status: DNR Family Communication: No family at bedside, called and updated daughter again this morning Disposition Plan: TBD, needs home with hospice or residential hospice  Consultants:    Procedures:   Antimicrobials:    Objective: Vitals:   11/27/24 2347 11/28/24 0347 11/28/24 0500 11/28/24 0716  BP: 114/69 (!) 115/58  117/61  Pulse: 63 61  62  Resp: 20 18  16   Temp: 97.8 F (36.6 C) 97.6 F (36.4 C)  97.7 F (36.5 C)  TempSrc: Oral Oral  Oral  SpO2: 99% 100%  96%  Weight:   55.7 kg   Height:        Intake/Output Summary (Last 24 hours) at 11/28/2024 1100 Last data filed at 11/27/2024 2348 Gross per 24 hour  Intake 240 ml  Output 1200 ml  Net -960 ml   Filed Weights   11/25/24 0349 11/27/24 0504 11/28/24 0500  Weight: 59.7 kg 55.4 kg 55.7 kg    Examination:  General exam: Chronically ill, very poor historian, awake alert, oriented to self and partly place only, moderate cognitive deficits Respiratory system: Scattered rhonchi, decreased breath sounds at the bases Cardiovascular system: S1 & S2 heard, RRR.  Abd: nondistended, soft and nontender.Normal bowel sounds heard. Central nervous system: Awake alert, oriented to self only Extremities: no edema Skin: No rashes Psychiatry: Flat affect   Data Reviewed:   CBC: Recent Labs  Lab 11/21/24 1229 11/21/24 1247 11/24/24 0307 11/25/24 0221 11/26/24 0220 11/27/24 0248 11/28/24 0245  WBC 14.8*   < > 11.1* 9.2 9.8 10.3 7.8  NEUTROABS 11.9*  --   --   --   --   --   --   HGB 12.2*   < > 10.8* 10.9* 11.0* 11.4* 11.5*  HCT 37.1*   < > 32.8*  33.1* 34.1* 35.2* 36.4*  MCV 78.8*   < > 76.8* 77.9* 78.4* 78.9* 80.7  PLT 242   < > 234 240 222 257 280   < > = values in this interval not displayed.   Basic Metabolic Panel: Recent Labs  Lab 11/24/24 0307 11/25/24 0221 11/26/24 0220 11/27/24 0248 11/28/24 0245  NA 137 135 138 143 144  K 4.1 3.8 4.0 3.5 3.7  CL 99 97* 100 104 104  CO2 25 27 24 27 27   GLUCOSE 123* 183* 359* 200* 369*  BUN 46* 46* 57* 57* 57*  CREATININE 1.85* 1.74* 1.81* 1.67* 1.52*  CALCIUM  9.3 9.5 9.5 9.7 9.5   GFR: Estimated Creatinine Clearance: 28.5 mL/min (A) (by C-G formula based on SCr of 1.52 mg/dL (H)). Liver Function Tests: Recent Labs  Lab 11/21/24 1229  AST 19  ALT 13  ALKPHOS 84  BILITOT 1.3*  PROT 7.7  ALBUMIN  3.4*   Recent Labs  Lab 11/21/24 1229  LIPASE 23   No results for input(s): AMMONIA in the last 168 hours. Coagulation Profile: No results for input(s): INR, PROTIME in the last 168 hours. Cardiac Enzymes: No results for input(s): CKTOTAL, CKMB, CKMBINDEX, TROPONINI in the last 168 hours. BNP (last 3 results) No results for input(s): PROBNP in the last 8760 hours. HbA1C: No results for input(s): HGBA1C in the  last 72 hours.  CBG: Recent Labs  Lab 11/27/24 0617 11/27/24 1134 11/27/24 1602 11/27/24 1921 11/28/24 0629  GLUCAP 224* 113* 105* 216* 318*   Lipid Profile: No results for input(s): CHOL, HDL, LDLCALC, TRIG, CHOLHDL, LDLDIRECT in the last 72 hours. Thyroid  Function Tests: No results for input(s): TSH, T4TOTAL, FREET4, T3FREE, THYROIDAB in the last 72 hours. Anemia Panel: No results for input(s): VITAMINB12, FOLATE, FERRITIN, TIBC, IRON, RETICCTPCT in the last 72 hours. Urine analysis:    Component Value Date/Time   COLORURINE YELLOW 11/21/2024 1229   APPEARANCEUR CLEAR 11/21/2024 1229   LABSPEC 1.013 11/21/2024 1229   PHURINE 7.0 11/21/2024 1229   GLUCOSEU >=500 (A) 11/21/2024 1229   HGBUR NEGATIVE  11/21/2024 1229   BILIRUBINUR NEGATIVE 11/21/2024 1229   KETONESUR NEGATIVE 11/21/2024 1229   PROTEINUR 100 (A) 11/21/2024 1229   UROBILINOGEN 1.0 10/04/2009 1109   NITRITE NEGATIVE 11/21/2024 1229   LEUKOCYTESUR NEGATIVE 11/21/2024 1229   Sepsis Labs: @LABRCNTIP (procalcitonin:4,lacticidven:4)  ) Recent Results (from the past 240 hours)  Culture, blood (routine x 2)     Status: None   Collection Time: 11/21/24 12:29 PM   Specimen: BLOOD RIGHT ARM  Result Value Ref Range Status   Specimen Description BLOOD RIGHT ARM  Final   Special Requests   Final    BOTTLES DRAWN AEROBIC AND ANAEROBIC Blood Culture results may not be optimal due to an inadequate volume of blood received in culture bottles   Culture   Final    NO GROWTH 5 DAYS Performed at [facility], [address] [place], [place] [address]    Report Status 11/26/2024 FINAL  Final  Culture, blood (routine x 2)     Status: None   Collection Time: 11/21/24 12:29 PM   Specimen: BLOOD RIGHT ARM  Result Value Ref Range Status   Specimen Description BLOOD RIGHT ARM  Final   Special Requests   Final    BOTTLES DRAWN AEROBIC AND ANAEROBIC Blood Culture results may not be optimal due to an inadequate volume of blood received in culture bottles   Culture   Final    NO GROWTH 5 DAYS Performed at [facility], [address] [place], [place] [address]    Report Status 11/26/2024 FINAL  Final  Resp panel by RT-PCR (RSV, Flu A&B, Covid) Peripheral     Status: None   Collection Time: 11/21/24 12:29 PM   Specimen: Peripheral; Nasal Swab  Result Value Ref Range Status   SARS Coronavirus 2 by RT PCR NEGATIVE NEGATIVE Final   Influenza A by PCR NEGATIVE NEGATIVE Final   Influenza B by PCR NEGATIVE NEGATIVE Final    Comment: (NOTE) The Xpert Xpress SARS-CoV-2/FLU/RSV plus assay is intended as an aid in the diagnosis of influenza from Nasopharyngeal swab specimens and should not be used as a sole basis for treatment.  Nasal washings and aspirates are unacceptable for Xpert Xpress SARS-CoV-2/FLU/RSV testing.  Fact Sheet for Patients: bloggercourse.com  Fact Sheet for Healthcare Providers: seriousbroker.it  This test is not yet approved or cleared by the [place]  FDA and has been authorized for detection and/or diagnosis of SARS-CoV-2 by FDA under an Emergency Use Authorization (EUA). This EUA will remain in effect (meaning this test can be used) for the duration of the COVID-19 declaration under Section 564(b)(1) of the Act, 21 U.S.C. section 360bbb-3(b)(1), unless the authorization is terminated or revoked.     Resp Syncytial Virus by PCR NEGATIVE NEGATIVE Final  Comment: (NOTE) Fact Sheet for Patients: bloggercourse.com  Fact Sheet for Healthcare Providers: seriousbroker.it  This test is not yet approved or cleared by the [place]  FDA and has been authorized for detection and/or diagnosis of SARS-CoV-2 by FDA under an Emergency Use Authorization (EUA). This EUA will remain in effect (meaning this test can be used) for the duration of the COVID-19 declaration under Section 564(b)(1) of the Act, 21 U.S.C. section 360bbb-3(b)(1), unless the authorization is terminated or revoked.  Performed at [facility], [address] [place], [place] [address]   MRSA Next Gen by PCR, Nasal     Status: None   Collection Time: 11/21/24  5:50 PM   Specimen: Nasal Mucosa; Nasal Swab  Result Value Ref Range Status   MRSA by PCR Next Gen NOT DETECTED NOT DETECTED Final    Comment: (NOTE) The GeneXpert MRSA Assay (FDA approved for NASAL specimens only), is one component of a comprehensive MRSA colonization surveillance program. It is not intended to diagnose MRSA infection nor to guide or monitor treatment for MRSA infections. Test performance is not FDA approved in patients less than 37  years old. Performed at [facility], [address] [place], [place] [address]      Radiology Studies: No results found.    Scheduled Meds:  amoxicillin -clavulanate  1 tablet Oral Q12H   vitamin B-12  1,000 mcg Oral Daily   enoxaparin  (LOVENOX ) injection  30 mg Subcutaneous Q24H   insulin  aspart  0-15 Units Subcutaneous TID WC   insulin  glargine-yfgn  25 Units Subcutaneous Daily   sodium chloride  flush  3 mL Intravenous Q12H   torsemide   20 mg Oral Daily   Continuous Infusions:     LOS: 7 days    Time spent:    Sigurd Pac, MD [facility]   11/28/2024, 11:00 AM

## 2024-11-29 DIAGNOSIS — A419 Sepsis, unspecified organism: Secondary | ICD-10-CM | POA: Diagnosis not present

## 2024-11-29 DIAGNOSIS — J189 Pneumonia, unspecified organism: Secondary | ICD-10-CM | POA: Diagnosis not present

## 2024-11-29 LAB — BASIC METABOLIC PANEL WITH GFR
Anion gap: 15 (ref 5–15)
BUN: 59 mg/dL — ABNORMAL HIGH (ref 8–23)
CO2: 28 mmol/L (ref 22–32)
Calcium: 9.5 mg/dL (ref 8.9–10.3)
Chloride: 105 mmol/L (ref 98–111)
Creatinine, Ser: 1.57 mg/dL — ABNORMAL HIGH (ref 0.61–1.24)
GFR, Estimated: 43 mL/min — ABNORMAL LOW (ref >60)
Glucose, Bld: 265 mg/dL — ABNORMAL HIGH (ref 70–99)
Potassium: 3.3 mmol/L — ABNORMAL LOW (ref 3.5–5.1)
Sodium: 148 mmol/L — ABNORMAL HIGH (ref 135–145)

## 2024-11-29 LAB — GLUCOSE, CAPILLARY
Glucose-Capillary: 266 mg/dL — ABNORMAL HIGH (ref 70–99)
Glucose-Capillary: 126 mg/dL — ABNORMAL HIGH (ref 70–99)
Glucose-Capillary: 129 mg/dL — ABNORMAL HIGH (ref 70–99)
Glucose-Capillary: 223 mg/dL — ABNORMAL HIGH (ref 70–99)

## 2024-11-29 NOTE — Progress Notes (Signed)
 [facility] Progress Note Patient: Andre Stout FMW:[id number] DOB: 20-Mar-1940  DOA: 11/21/2024 DOS: the patient was seen and examined on 11/29/2024  Brief Hospital Course: 84/M chronically ill with Dementia, Type 2 diabetes mellitus, hypertension, chronic anemia, complete heart block, prostate cancer, recently hospitalized 11/2-7 presented with failure to thrive, poor oral intake and diarrhea, noted to be in CHF exacerbation, AKI, hyponatremia and multifocal pneumonia, treated with antibiotics, diuresed, there was discussion on palliative care but not pursued.  Brought to the ED 11/22 with increased weakness and confusion, also history of worsening cough for few days, some dyspnea.  In the ED afebrile, vital stable, CBG over 300, WBC 14.8, lactate 4.9, BNP 1240, troponin 23, creatinine 1.6, chest x-ray noted new right upper lobe consolidation concerning for developing pneumonia, CT chest abdomen pelvis noted large focal areas of pulmonary consolidation right upper lobe and left lower lobe with progression of right upper lobe disease and moderate left and minimal right pleural effusions improved since prior study. - Admitted, started on antibiotics, SLP eval ongoing - 11/24, labs concerning for DKA, CBGs in the 800 range - Palliative consult, remains DNR, patient was reluctant to consider hospice - 11/26 recurrent respiratory distress, concern for aspiration again, repeat CXR w/ worsening Pneumonia - Significant failure to thrive, minimal p.o. intake, ongoing aspiration - 11/27, overnight with respiratory distress, per staff aspiration with all p.o.'s> called daughter and recommended comfort care -11/28: Continued failure to thrive, call daughter again, palliative reconsulted.   Assessment and Plan: Severe sepsis secondary to pneumonia Severe dysphagia Patient presented with reports of increased weakness with reports of increasing cough over the last 2 days.  Found to be tachypneic with  white blood cell count elevated at 14.8 and initial lactic acid 4.9.  CT chest abdomen pelvis revealed concern for large focal areas of pulmonary consolidation in the right upper lobe and left lower lobe with progression of the right upper lobe consolidation.  Review of records note previous speech therapy evaluation recommended dysphagia II diet Treated with IV Zosyn  initially and then switched to oral Augmentin  SLP following, MBS completed, noted to have oropharyngeal dysphagia, Blood cultures negative thus far Pulmonary toilet, add DuoNebs, Mucinex  Has significant chronic disease burden, , repeat x-ray with worsening pneumonia.    DKA Type 2 diabetes mellitus Insulin  was held on admission as he was hypoglycemic recent admission, developed significant hyperglycemia and DKA earlier this admission was getting excessive amounts of Glucerna Now off insulin  gtt., continue glargine, add SSI   Dementia, without behavioral issues memory and cognitive decline for 3years   Intermittent complete heart block -Followed by cardiology as recent as few weeks ago, patient declined PPM, also felt to be a poor candidate for invasive management   Chronic combined CHF - Last echo few weeks ago in 11/25 with a EF 35-40% with wall motion abnormality, mildly reduced RV  -EF noted to be depressed at 35% per last echocardiogram on 11/3.  Seen by cards, on account of numerous comorbidities no further invasive evaluation was recommended. - Now on 20 mg-dose torsemide .  Further adjustment of GDMT limited due to renal function as well as soft blood pressures.   Acute metabolic encephalopathy Cognitive impairment Patient noted to be acutely altered and unable to recognize family or location.  At baseline reportedly is able to recognize family and location although normally does not know the year.  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  No nausea no vomiting.  Still has some cough.  Minimal oral intake.  Physical Exam: General: Chronically ill-appearing, in Mild distress, No Rash Cardiovascular: S1 and S2 Present, No Murmur Respiratory: Good respiratory effort, Bilateral Air entry present.  Basal crackles, No wheezes Abdomen: Bowel Sound present, No tenderness Extremities: Trace edema Neuro: Alert and oriented to self and place, no new focal deficit   Data Reviewed: I have Reviewed nursing notes, Vitals, and Lab results. Since last encounter, pertinent lab results CBC and BMP   . I have ordered test including CBC and BMP  .   Disposition: Status is: Inpatient Remains inpatient appropriate because: Monitor for improvement in infection and clarity of goal of care  enoxaparin  (LOVENOX ) injection 30 mg Start: 11/23/24 2200  Family Communication: No one at bedside Level of care: Telemetry   Vitals:   11/29/24 0410 11/29/24 0736 11/29/24 1047 11/29/24 1556  BP:  110/61 (!) 103/55 (!) 115/58  Pulse: 80 66 70 95  Resp:  17 20 20   Temp: 98.4 F (36.9 C) 97.6 F (36.4 C) 98.5 F (36.9 C) 98.5 F (36.9 C)  TempSrc: Oral Oral Oral Oral  SpO2: 97% 100% 97% 98%  Weight: 55.6 kg     Height:         Author: Yetta Blanch, MD 11/29/2024 5:29 PM  Please look on [email or web address] to find out who is on call.

## 2024-11-29 NOTE — Plan of Care (Signed)
   Problem: Education: Goal: Knowledge of General Education information will improve Description: Including pain rating scale, medication(s)/side effects and non-pharmacologic comfort measures Outcome: Progressing   Problem: Clinical Measurements: Goal: Ability to maintain clinical measurements within normal limits will improve Outcome: Progressing Goal: Diagnostic test results will improve Outcome: Progressing Goal: Cardiovascular complication will be avoided Outcome: Progressing

## 2024-11-30 DIAGNOSIS — J690 Pneumonitis due to inhalation of food and vomit: Secondary | ICD-10-CM

## 2024-11-30 DIAGNOSIS — R1312 Dysphagia, oropharyngeal phase: Secondary | ICD-10-CM

## 2024-11-30 DIAGNOSIS — F03C0 Unspecified dementia, severe, without behavioral disturbance, psychotic disturbance, mood disturbance, and anxiety: Secondary | ICD-10-CM

## 2024-11-30 LAB — CBC WITH DIFFERENTIAL/PLATELET
Abs Immature Granulocytes: 0.1 K/uL — ABNORMAL HIGH (ref 0.00–0.07)
Basophils Absolute: 0.1 K/uL (ref 0.0–0.1)
Basophils Relative: 1 %
Eosinophils Absolute: 0.1 K/uL (ref 0.0–0.5)
Eosinophils Relative: 1 %
HCT: 37.1 % — ABNORMAL LOW (ref 39.0–52.0)
Hemoglobin: 11.3 g/dL — ABNORMAL LOW (ref 13.0–17.0)
Immature Granulocytes: 1 %
Lymphocytes Relative: 23 %
Lymphs Abs: 2.1 K/uL (ref 0.7–4.0)
MCH: 25.2 pg — ABNORMAL LOW (ref 26.0–34.0)
MCHC: 30.5 g/dL (ref 30.0–36.0)
MCV: 82.8 fL (ref 80.0–100.0)
Monocytes Absolute: 0.7 K/uL (ref 0.1–1.0)
Monocytes Relative: 7 %
Neutro Abs: 6.1 K/uL (ref 1.7–7.7)
Neutrophils Relative %: 67 %
Platelets: 372 K/uL (ref 150–400)
RBC: 4.48 MIL/uL (ref 4.22–5.81)
RDW: 18.5 % — ABNORMAL HIGH (ref 11.5–15.5)
WBC: 9.1 K/uL (ref 4.0–10.5)
nRBC: 0 % (ref 0.0–0.2)

## 2024-11-30 LAB — COMPREHENSIVE METABOLIC PANEL WITH GFR
ALT: 15 U/L (ref 0–44)
AST: 19 U/L (ref 15–41)
Albumin: 2.5 g/dL — ABNORMAL LOW (ref 3.5–5.0)
Alkaline Phosphatase: 64 U/L (ref 38–126)
Anion gap: 6 (ref 5–15)
BUN: 59 mg/dL — ABNORMAL HIGH (ref 8–23)
CO2: 33 mmol/L — ABNORMAL HIGH (ref 22–32)
Calcium: 9.6 mg/dL (ref 8.9–10.3)
Chloride: 108 mmol/L (ref 98–111)
Creatinine, Ser: 1.69 mg/dL — ABNORMAL HIGH (ref 0.61–1.24)
GFR, Estimated: 40 mL/min — ABNORMAL LOW (ref >60)
Glucose, Bld: 312 mg/dL — ABNORMAL HIGH (ref 70–99)
Potassium: 3.3 mmol/L — ABNORMAL LOW (ref 3.5–5.1)
Sodium: 147 mmol/L — ABNORMAL HIGH (ref 135–145)
Total Bilirubin: 0.7 mg/dL (ref 0.0–1.2)
Total Protein: 6.7 g/dL (ref 6.5–8.1)

## 2024-11-30 LAB — GLUCOSE, CAPILLARY
Glucose-Capillary: 303 mg/dL — ABNORMAL HIGH (ref 70–99)
Glucose-Capillary: 205 mg/dL — ABNORMAL HIGH (ref 70–99)
Glucose-Capillary: 164 mg/dL — ABNORMAL HIGH (ref 70–99)
Glucose-Capillary: 214 mg/dL — ABNORMAL HIGH (ref 70–99)

## 2024-11-30 LAB — MAGNESIUM: Magnesium: 2 mg/dL (ref 1.7–2.4)

## 2024-11-30 MED ORDER — FERROUS SULFATE 325 (65 FE) MG PO TABS
325.0000 mg | ORAL_TABLET | Freq: Every day | ORAL | Status: DC
  Administered 2024-12-04 – 2024-12-08 (×5): 325 mg via ORAL
  Filled 2024-11-30 (×8): qty 1

## 2024-11-30 MED ORDER — INSULIN GLARGINE-YFGN 100 UNIT/ML ~~LOC~~ SOLN
30.0000 [IU] | Freq: Every day | SUBCUTANEOUS | Status: DC
  Administered 2024-11-30 – 2024-12-01 (×2): 30 [IU] via SUBCUTANEOUS
  Filled 2024-11-30 (×3): qty 0.3

## 2024-11-30 MED ORDER — AMOXICILLIN-POT CLAVULANATE 500-125 MG PO TABS
1.0000 | ORAL_TABLET | Freq: Two times a day (BID) | ORAL | Status: AC
  Administered 2024-11-30 (×2): 1 via ORAL
  Filled 2024-11-30 (×2): qty 1

## 2024-11-30 MED ORDER — POTASSIUM CHLORIDE CRYS ER 20 MEQ PO TBCR
40.0000 meq | EXTENDED_RELEASE_TABLET | Freq: Once | ORAL | Status: AC
  Administered 2024-11-30: 40 meq via ORAL
  Filled 2024-11-30: qty 2

## 2024-11-30 MED ORDER — PANTOPRAZOLE SODIUM 40 MG PO TBEC
40.0000 mg | DELAYED_RELEASE_TABLET | Freq: Every day | ORAL | Status: DC
  Administered 2024-11-30 – 2024-12-08 (×8): 40 mg via ORAL
  Filled 2024-11-30 (×8): qty 1

## 2024-11-30 MED ORDER — ATORVASTATIN CALCIUM 40 MG PO TABS
40.0000 mg | ORAL_TABLET | Freq: Every day | ORAL | Status: DC
  Administered 2024-11-30 – 2024-12-08 (×9): 40 mg via ORAL
  Filled 2024-11-30 (×9): qty 1

## 2024-11-30 NOTE — Progress Notes (Signed)
 Occupational Therapy Treatment Patient Details Name: Andre Stout MRN: [id number] DOB: [birth year] Today's Date: 11/30/2024   History of present illness The pt is an 84 yo male presenting 11/22 with weakness and BLE swelling. Admitted for sepsis due to PNA. PMH includes: dementia, AV block, HTN, T2DM, prostate CA, glaucoma, CHF, CVA, HLD   OT comments  Pt progressing toward goals this session, oriented to self and place. Needs set up A for seated grooming task, mod-max +2 for bed mobility and mod-max +2 for transfers with 2 person HHA. Pt with posterior lean, braces legs against back of bed. Pt presenting with impairments listed below, will follow acutely. Patient will benefit from continued inpatient follow up therapy, <3 hours/day to maximize safety/ind with ADL/functional mobility.       If plan is discharge home, recommend the following:  A lot of help with walking and/or transfers;A lot of help with bathing/dressing/bathroom;Assistance with cooking/housework;Assist for transportation;Help with stairs or ramp for entrance;Supervision due to cognitive status   Equipment Recommendations  Other (comment) (defer)    Recommendations for Other Services PT consult    Precautions / Restrictions Precautions Precautions: Fall Recall of Precautions/Restrictions: Impaired Restrictions Weight Bearing Restrictions Per Provider Order: No       Mobility Bed Mobility Overal bed mobility: Needs Assistance Bed Mobility: Supine to Sit, Sit to Supine     Supine to sit: Mod assist Sit to supine: Max assist, +2 for physical assistance        Transfers Overall transfer level: Needs assistance Equipment used: 2 person hand held assist Transfers: Bed to chair/wheelchair/BSC, Sit to/from Stand Sit to Stand: Mod assist, +2 physical assistance Stand pivot transfers: Max assist, +2 physical assistance               Balance Overall balance assessment: Needs assistance Sitting-balance  support: Bilateral upper extremity supported, Feet supported Sitting balance-Leahy Scale: Fair Sitting balance - Comments: min A EOB   Standing balance support: Bilateral upper extremity supported, During functional activity, Reliant on assistive device for balance Standing balance-Leahy Scale: Zero Standing balance comment: reliant on external support for maintaining upright                           ADL either performed or assessed with clinical judgement   ADL Overall ADL's : Needs assistance/impaired     Grooming: Wash/dry face;Sitting;Set up                   Toilet Transfer: Maximal assistance;+2 for physical assistance;Stand-pivot;BSC/3in1                  Extremity/Trunk Assessment Upper Extremity Assessment Upper Extremity Assessment: Generalized weakness   Lower Extremity Assessment Lower Extremity Assessment: Defer to PT evaluation        Vision   Vision Assessment?: No apparent visual deficits   Perception Perception Perception: Not tested   Praxis Praxis Praxis: Not tested   Communication Communication Communication: Impaired Factors Affecting Communication: Hearing impaired   Cognition Arousal: Alert Behavior During Therapy: Flat affect     Orientation impairments: Place, Person Awareness: Intellectual awareness impaired, Online awareness impaired Memory impairment (select all impairments): Short-term memory, Working memory Attention impairment (select first level of impairment): Sustained attention Executive functioning impairment (select all impairments): Reasoning, Problem solving                   Following commands: Impaired Following commands impaired: Follows one step commands inconsistently  Cueing   Cueing Techniques: Verbal cues, Tactile cues, Gestural cues  Exercises Exercises: Other exercises Other Exercises Other Exercises: seated leg kicks x10 Other Exercises: seated functional reach x10     Shoulder Instructions       General Comments VSS    Pertinent Vitals/ Pain       Pain Assessment Pain Assessment: No/denies pain  Home Living                                          Prior Functioning/Environment              Frequency  Min 2X/week        Progress Toward Goals  OT Goals(current goals can now be found in the care plan section)  Progress towards OT goals: Progressing toward goals  Acute Rehab OT Goals Patient Stated Goal: none stated OT Goal Formulation: With patient Time For Goal Achievement: 12/07/24 Potential to Achieve Goals: Good ADL Goals Pt Will Perform Grooming: with min assist;with contact guard assist;sitting Pt Will Perform Upper Body Bathing: with min assist;with contact guard assist;sitting Pt Will Perform Upper Body Dressing: with min assist;with contact guard assist;sitting Pt Will Transfer to Toilet: with max assist;stand pivot transfer  Plan      Co-evaluation    PT/OT/SLP Co-Evaluation/Treatment: Yes Reason for Co-Treatment: Complexity of the patient's impairments (multi-system involvement);Necessary to address cognition/behavior during functional activity;To address functional/ADL transfers   OT goals addressed during session: ADL's and self-care;Strengthening/ROM      AM-PAC OT 6 Clicks Daily Activity     Outcome Measure   Help from another person eating meals?: A Little Help from another person taking care of personal grooming?: A Little Help from another person toileting, which includes using toliet, bedpan, or urinal?: Total Help from another person bathing (including washing, rinsing, drying)?: A Little Help from another person to put on and taking off regular upper body clothing?: A Lot Help from another person to put on and taking off regular lower body clothing?: A Lot 6 Click Score: 14    End of Session Equipment Utilized During Treatment: Gait belt  OT Visit Diagnosis: Other  abnormalities of gait and mobility (R26.89);Muscle weakness (generalized) (M62.81);Other symptoms and signs involving cognitive function   Activity Tolerance Patient limited by fatigue   Patient Left in bed;with call bell/phone within reach;with bed alarm set   Nurse Communication Mobility status        Time: 1240-1308 OT Time Calculation (min): 28 min  Charges: OT General Charges $OT Visit: 1 Visit OT Treatments $Self Care/Home Management : 8-22 mins  Bora Bost K, OTD, OTR/L SecureChat Preferred Acute Rehab [phone]   Laneta POUR Koonce 11/30/2024, 2:13 PM

## 2024-11-30 NOTE — Hospital Course (Signed)
 84/M chronically ill with Dementia, Type 2 diabetes mellitus, hypertension, chronic anemia, complete heart block, prostate cancer, recently hospitalized 11/2-7 presented with failure to thrive, poor oral intake and diarrhea, noted to be in CHF exacerbation, AKI, hyponatremia and multifocal pneumonia, treated with antibiotics, diuresed, there was discussion on palliative care but not pursued.  Brought to the ED 11/22 with increased weakness and confusion, also history of worsening cough for few days, some dyspnea.  In the ED afebrile, vital stable, CBG over 300, WBC 14.8, lactate 4.9, BNP 1240, troponin 23, creatinine 1.6, chest x-ray noted new right upper lobe consolidation concerning for developing pneumonia, CT chest abdomen pelvis noted large focal areas of pulmonary consolidation right upper lobe and left lower lobe with progression of right upper lobe disease and moderate left and minimal right pleural effusions improved since prior study. - Admitted, started on antibiotics, SLP eval ongoing - 11/24, labs concerning for DKA, CBGs in the 800 range - Palliative consult, remains DNR, patient was reluctant to consider hospice - 11/26 recurrent respiratory distress, concern for aspiration again, repeat CXR w/ worsening Pneumonia - Significant failure to thrive, minimal p.o. intake, ongoing aspiration - 11/27, overnight with respiratory distress, per staff aspiration with all p.o.'s> called daughter and recommended comfort care -11/28: Continued failure to thrive, call daughter again, palliative reconsulted.    Assessment and Plan: Severe sepsis secondary to pneumonia Severe dysphagia Patient presented with reports of increased weakness with reports of increasing cough over the last 2 days.  Found to be tachypneic with white blood cell count elevated at 14.8 and initial lactic acid 4.9.  CT chest abdomen pelvis revealed concern for large focal areas of pulmonary consolidation in the right upper lobe and  left lower lobe with progression of the right upper lobe consolidation.  Review of records note previous speech therapy evaluation recommended dysphagia II diet Treated with IV Zosyn  initially and then switched to oral Augmentin  SLP following, MBS completed, noted to have oropharyngeal dysphagia, Blood cultures negative thus far Pulmonary toilet, add DuoNebs, Mucinex  Has significant chronic disease burden, , repeat x-ray with worsening pneumonia. Now antibiotics stopped.  Observe overnight.    DKA Type 2 diabetes mellitus Insulin  was held on admission as he was hypoglycemic recent admission, developed significant hyperglycemia and DKA earlier this admission was getting excessive amounts of Glucerna Now off insulin  gtt., continue glargine, add SSI   Dementia, without behavioral issues memory and cognitive decline for 3years   Intermittent complete heart block -Followed by cardiology as recent as few weeks ago, patient declined PPM, also felt to be a poor candidate for invasive management   Chronic combined CHF - Last echo few weeks ago in 11/25 with a EF 35-40% with wall motion abnormality, mildly reduced RV  -EF noted to be depressed at 35% per last echocardiogram on 11/3.  Seen by cards, on account of numerous comorbidities no further invasive evaluation was recommended. - Now on 20 mg-dose torsemide .  Further adjustment of GDMT limited due to renal function as well as soft blood pressures. Holding diuresis as serum sodium trending up.   Acute metabolic encephalopathy Cognitive impairment Patient noted to be acutely altered and unable to recognize family or location.  At baseline reportedly is able to recognize family and location although normally does not know the year.  Possibly to be multifactorial in nature - TSH was normal recently, B12 significantly low, started on replacement -delirium precautions   Hyponatremia now with hypernatremia Low sodium is resolved.  Now  hypernatremia  likely from poor p.o. intake.  Will monitor.  Holding diuresis.   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  Monitor.   Microcytic anemia Stable

## 2024-11-30 NOTE — Progress Notes (Signed)
 Physical Therapy Treatment Patient Details Name: Andre Stout MRN: [id number] DOB: 06-17-1940 Today's Date: 11/30/2024   History of Present Illness The pt is an 84 yo male presenting 11/22 with weakness and BLE swelling. Admitted for sepsis due to PNA. PMH includes: dementia, AV block, HTN, T2DM, prostate CA, glaucoma, CHF, CVA, HLD    PT Comments  Pt more alert and interactive this date. Pt only oriented to self but was able to follow simple commands majority of time. Pt presenting with rigidity, weakness, impaired balance, and requiring mod/maxAX2 for mobility. Pt unable to ambulate at this time. Pt transferred to chair however pt continuously sliding down in chair so pt returned back to bed due to increased risk of falling out of chair. Acute PT to cont to follow. Aware family waiting to speak with palliative. At this time recommending inpatient rehab program < 3 hrs a day to address above deficits. Acute PT to cont to follow.    If plan is discharge home, recommend the following: Two people to help with walking and/or transfers;Two people to help with bathing/dressing/bathroom;Assistance with cooking/housework;Assistance with feeding;Direct supervision/assist for medications management;Direct supervision/assist for financial management;Assist for transportation;Help with stairs or ramp for entrance;Supervision due to cognitive status   Can travel by private vehicle     No  Equipment Recommendations  Wheelchair (measurements PT);Wheelchair cushion (measurements PT);Hoyer lift    Recommendations for [employer]       Precautions / Restrictions Precautions Precautions: Fall Recall of Precautions/Restrictions: Impaired Restrictions Weight Bearing Restrictions Per Provider Order: No     Mobility  Bed Mobility Overal bed mobility: Needs Assistance Bed Mobility: Supine to Sit, Sit to Supine     Supine to sit: Mod assist Sit to supine: Max assist, +2 for physical assistance    General bed mobility comments: max multimodal cues to complete task, pt very rigid with trunk flexed    Transfers Overall transfer level: Needs assistance Equipment used: 2 person hand held assist (face to face transfer with use of gait belt) Transfers: Bed to chair/wheelchair/BSC, Sit to/from Stand Sit to Stand: +2 physical assistance, Max assist Stand pivot transfers: Max assist, +2 physical assistance         General transfer comment: attempted to stand in walker however pt with posterior bias depsited tactile cues at posterior hips to promote anterior weight shift, pt resistant (most likely due to fear of falling), pt ultimately maxAX2 for sit to stand/std pvt to chair and then back to bed. pt kept sliding down in chair and was unsafe to stay in chair    Ambulation/Gait               General Gait Details: unable   Stairs             Wheelchair Mobility     Tilt Bed    Modified Rankin (Stroke Patients Only)       Balance Overall balance assessment: Needs assistance Sitting-balance support: Bilateral upper extremity supported, Feet supported Sitting balance-Leahy Scale: Fair Sitting balance - Comments: min A EOB   Standing balance support: Bilateral upper extremity supported, During functional activity, Reliant on assistive device for balance Standing balance-Leahy Scale: Zero Standing balance comment: reliant on external support for maintaining upright                            Communication Communication Communication: Impaired Factors Affecting Communication: Hearing impaired  Cognition Arousal: Alert Behavior During Therapy: Flat  affect   PT - Cognitive impairments: No apparent impairments   Orientation impairments: Place, Time, Situation                   PT - Cognition Comments: pt able to follow simple commands majority of time, poor recall, poor memory Following commands: Impaired Following commands impaired: Follows  one step commands inconsistently    Cueing Cueing Techniques: Verbal cues, Tactile cues, Gestural cues  Exercises      General Comments General comments (skin integrity, edema, etc.): VSS      Pertinent Vitals/Pain Pain Assessment Pain Assessment: No/denies pain    Home Living                          Prior Function            PT Goals (current goals can now be found in the care plan section) Acute Rehab PT Goals PT Goal Formulation: Patient unable to participate in goal setting Time For Goal Achievement: 12/13/24 Potential to Achieve Goals: Fair Progress towards PT goals: Progressing toward goals    Frequency    Min 2X/week      PT Plan      Co-evaluation PT/OT/SLP Co-Evaluation/Treatment: Yes Reason for Co-Treatment: Complexity of the patient's impairments (multi-system involvement);Necessary to address cognition/behavior during functional activity;To address functional/ADL transfers PT goals addressed during session: Mobility/safety with mobility OT goals addressed during session: ADL's and self-care;Strengthening/ROM      AM-PAC PT 6 Clicks Mobility   Outcome Measure  Help needed turning from your back to your side while in a flat bed without using bedrails?: A Lot Help needed moving from lying on your back to sitting on the side of a flat bed without using bedrails?: A Lot Help needed moving to and from a bed to a chair (including a wheelchair)?: Total Help needed standing up from a chair using your arms (e.g., wheelchair or bedside chair)?: A Lot Help needed to walk in hospital room?: Total Help needed climbing 3-5 steps with a railing? : Total 6 Click Score: 9    End of Session Equipment Utilized During Treatment: Gait belt Activity Tolerance: Patient tolerated treatment well Patient left: in bed;with call bell/phone within reach;with bed alarm set Nurse Communication: Mobility status PT Visit Diagnosis: Other abnormalities of gait and  mobility (R26.89);Difficulty in walking, not elsewhere classified (R26.2);Muscle weakness (generalized) (M62.81)     Time: 8759-8691 PT Time Calculation (min) (ACUTE ONLY): 28 min  Charges:    $Therapeutic Activity: 8-22 mins PT General Charges $$ ACUTE PT VISIT: 1 Visit                     Norene Ames, PT, DPT Acute Rehabilitation Services Secure chat preferred Office #: [phone]    Norene CHRISTELLA Ames 11/30/2024, 2:27 PM

## 2024-11-30 NOTE — Plan of Care (Signed)
   Problem: Clinical Measurements: Goal: Ability to maintain clinical measurements within normal limits will improve Outcome: Progressing Goal: Will remain free from infection Outcome: Progressing Goal: Respiratory complications will improve Outcome: Progressing

## 2024-11-30 NOTE — Inpatient Diabetes Management (Signed)
 Inpatient Diabetes Program Recommendations  AACE/ADA: New Consensus Statement on Inpatient Glycemic Control (2015)  Target Ranges:  Prepandial:   less than 140 mg/dL      Peak postprandial:   less than 180 mg/dL (1-2 hours)      Critically ill patients:  140 - 180 mg/dL   Lab Results  Component Value Date   GLUCAP 303 (H) 11/30/2024   HGBA1C 11.4 (H) 11/25/2024    Review of Glycemic Control  Latest Reference Range & Units 11/28/24 21:56 11/29/24 05:36 11/29/24 10:47 11/29/24 15:58 11/29/24 21:02 11/30/24 06:16  Glucose-Capillary 70 - 99 mg/dL 764 (H) 733 (H) 873 (H) 129 (H) 223 (H) 303 (H)  (H): Data is abnormally high Diabetes history: Type 2 DM Outpatient Diabetes medications: Lantus  15 units every day, Novolog  5-7 units TID Current orders for Inpatient glycemic control: Semglee  30 units QDS, Novolog  0-15 units TID  Inpatient Diabetes Program Recommendations:    Noted changes made to insulin . Would also recommend addition of Novolog  0-5 units at bedtime.   Thanks, Tinnie Minus, MSN, RNC-OB Diabetes Coordinator [phone] (8a-5p)

## 2024-11-30 NOTE — Plan of Care (Signed)
   Problem: Education: Goal: Knowledge of General Education information will improve Description: Including pain rating scale, medication(s)/side effects and non-pharmacologic comfort measures Outcome: Progressing   Problem: Nutrition: Goal: Adequate nutrition will be maintained Outcome: Progressing   Problem: Coping: Goal: Level of anxiety will decrease Outcome: Progressing

## 2024-11-30 NOTE — Progress Notes (Signed)
 [facility] Progress Note Patient: Andre Stout FMW:[id number] DOB: [birth year]  DOA: 11/21/2024 DOS: the patient was seen and examined on 11/30/2024  Brief Hospital Course: 84/M chronically ill with Dementia, Type 2 diabetes mellitus, hypertension, chronic anemia, complete heart block, prostate cancer, recently hospitalized 11/2-7 presented with failure to thrive, poor oral intake and diarrhea, noted to be in CHF exacerbation, AKI, hyponatremia and multifocal pneumonia, treated with antibiotics, diuresed, there was discussion on palliative care but not pursued.  Brought to the ED 11/22 with increased weakness and confusion, also history of worsening cough for few days, some dyspnea.  In the ED afebrile, vital stable, CBG over 300, WBC 14.8, lactate 4.9, BNP 1240, troponin 23, creatinine 1.6, chest x-ray noted new right upper lobe consolidation concerning for developing pneumonia, CT chest abdomen pelvis noted large focal areas of pulmonary consolidation right upper lobe and left lower lobe with progression of right upper lobe disease and moderate left and minimal right pleural effusions improved since prior study. - Admitted, started on antibiotics, SLP eval ongoing - 11/24, labs concerning for DKA, CBGs in the 800 range - Palliative consult, remains DNR, patient was reluctant to consider hospice - 11/26 recurrent respiratory distress, concern for aspiration again, repeat CXR w/ worsening Pneumonia - Significant failure to thrive, minimal p.o. intake, ongoing aspiration - 11/27, overnight with respiratory distress, per staff aspiration with all p.o.'s> called daughter and recommended comfort care -11/28: Continued failure to thrive, call daughter again, palliative reconsulted.    Assessment and Plan: Severe sepsis secondary to pneumonia Severe dysphagia Patient presented with reports of increased weakness with reports of increasing cough over the last 2 days.  Found to be tachypneic with  white blood cell count elevated at 14.8 and initial lactic acid 4.9.  CT chest abdomen pelvis revealed concern for large focal areas of pulmonary consolidation in the right upper lobe and left lower lobe with progression of the right upper lobe consolidation.  Review of records note previous speech therapy evaluation recommended dysphagia II diet Treated with IV Zosyn  initially and then switched to oral Augmentin  SLP following, MBS completed, noted to have oropharyngeal dysphagia, Blood cultures negative thus far Pulmonary toilet, add DuoNebs, Mucinex  Has significant chronic disease burden, , repeat x-ray with worsening pneumonia. Now antibiotics stopped.  Observe overnight.    DKA Type 2 diabetes mellitus Insulin  was held on admission as he was hypoglycemic recent admission, developed significant hyperglycemia and DKA earlier this admission was getting excessive amounts of Glucerna Now off insulin  gtt., continue glargine, add SSI   Dementia, without behavioral issues memory and cognitive decline for 3years   Intermittent complete heart block -Followed by cardiology as recent as few weeks ago, patient declined PPM, also felt to be a poor candidate for invasive management   Chronic combined CHF - Last echo few weeks ago in 11/25 with a EF 35-40% with wall motion abnormality, mildly reduced RV  -EF noted to be depressed at 35% per last echocardiogram on 11/3.  Seen by cards, on account of numerous comorbidities no further invasive evaluation was recommended. - Now on 20 mg-dose torsemide .  Further adjustment of GDMT limited due to renal function as well as soft blood pressures. Holding diuresis as serum sodium trending up.   Acute metabolic encephalopathy Cognitive impairment Patient noted to be acutely altered and unable to recognize family or location.  At baseline reportedly is able to recognize family and location although normally does not know the year.  Possibly to be multifactorial  in  nature - TSH was normal recently, B12 significantly low, started on replacement -delirium precautions   Hyponatremia now with hypernatremia Low sodium is resolved.  Now hypernatremia likely from poor p.o. intake.  Will monitor.  Holding diuresis.   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  Monitor.   Microcytic anemia Stable   Subjective: Denies any acute complaint.  More fatigue and tired today.  No nausea no vomiting.  Physical Exam: Basal crackles. S1-S2 present Bowel sound present. Drowsy.  Oriented to self.  Data Reviewed: I have Reviewed nursing notes, Vitals, and Lab results. Since last encounter, pertinent lab results CBC and BMP   . I have ordered test including CBC and BMP  .   Disposition: Status is: Inpatient Remains inpatient appropriate because: Monitor for improvement in serum sodium.  enoxaparin  (LOVENOX ) injection 30 mg Start: 11/23/24 2200   Family Communication: No one at bedside discussed with daughter on the phone. Level of care: Telemetry   Vitals:   11/30/24 0734 11/30/24 0829 11/30/24 1125 11/30/24 1541  BP:  (!) 111/58 110/64 (!) 104/52  Pulse:  70 63 71  Resp:  18 17 18   Temp: (!) (P) 97.4 F (36.3 C) (!) 97.4 F (36.3 C) 97.7 F (36.5 C) 97.8 F (36.6 C)  TempSrc: (P) Axillary Oral Oral Oral  SpO2:  100% 100% 100%  Weight:      Height:         Author: Yetta Blanch, MD 11/30/2024 5:58 PM  Please look on [email or web address] to find out who is on call.

## 2024-11-30 NOTE — Progress Notes (Signed)
 Patient ID: KEIMARI Edgewood, male   DOB: 1940/07/22, 84 y.o.   MRN: [id number]    Progress Note from the Palliative Medicine Team at [facility]   Patient Name: Andre Stout        Date: 11/30/2024 DOB: [birth year]  Age: 84 y.o. MRN#: [id number] Attending Physician: Tobie Yetta HERO, MD Primary Care Physician: Pcp, No Admit Date: 11/21/2024   Reason for Consultation/Follow-up   Establishing Goals of Care   HPI/ Brief Hospital Review 84/M chronically ill with Dementia, Type 2 diabetes mellitus, hypertension, chronic anemia, complete heart block, prostate cancer, recently hospitalized  with failure to thrive, poor oral intake and diarrhea, noted to be in CHF exacerbation, AKI, hyponatremia and multifocal pneumonia, treated with antibiotics, diuresed, there was discussion on palliative care but not pursued.    Brought to the ED 11/22 with increased weakness and confusion, also history of worsening cough for few days, some dyspnea.  CT chest  noted large focal areas of pulmonary consolidation right upper lobe and left lower lobe with progression of right upper lobe disease and moderate left and minimal right pleural effusions improved since prior study.   - Admitted, started on antibiotics, SLP eval ongoing - 11/24, labs concerning for DKA, CBGs in the 800 range - Palliative consult, remains DNR, patient was reluctant to consider hospice - 11/26 recurrent respiratory distress, concern for aspiration again, repeat CXR w/ worsening Pneumonia - Significant failure to thrive, minimal p.o. intake, ongoing aspiration - 11/27, overnight with respiratory distress, per staff aspiration with all p.o.'s> called daughter and recommended comfort care -11/28: Continued failure to thrive, call daughter again, palliative reconsulted.     Subjective  Extensive chart review has been completed prior to meeting with patient/family  including labs, vital signs, imaging, progress/consult notes, orders,  medications and available advance directive documents.    This NP assessed patient at the bedside as a follow up for palliative medicine needs and emotional support. Patient is lethargic, nonverbal and unable to follow commands.  I was able to speak to patient's daughter/ [name] by phone today.    I introduced myself as a provider with the palliative medicine team.  An initial consult was completed on 11/23/2024   Education was provided to family today regarding patient's current medical condition specifically the natural trajectory of dementia and its associated complications, including progressive immobility, cognitive decline, dysphagia with risk for secondary aspiration pneumonia, and increased susceptibility to skin breakdown.     Education offered on other medical underlying conditions specific to his history of CVA, CKD, heart block.  Education offered on his overall poor oral intake, and his current albumin  of 2.5.   Education was provided regarding the often difficult decisions family's face regarding artificial nutrition and hydration.  Discussed the potential benefits and burdens of these interventions, including the limited evidence for improve quality of life or survival, and the associated risk such as aspiration, infection, discomfort.    Education offered on conversation around SNF for rehabilitation.    Discussed with the daughter the importance of evaluating the patient's current ability to meaningfully participate in physical therapy and the likelihood of achieving positive functional outcomes.  Education offered on how the patient's underlying medical condition's and overall disease trajectory may limit rehabilitation potential and should guide expectations and future care planning.  Education offered on hospice benefit; philosophy and eligibility.  We discussed hospice services both in the home and at an inpatient hospice unit.      Ms. Lonzell  verbalized appreciation  for today's conversation.   She verbalizes that she feels like she has been getting mixed messages regarding her fathers overall prognosis and treatment plan.  She hopes to take the conversation that we had today to her 2 children as they make decisions moving forward for [name].  Daughter verbalizes a clear understanding of her fathers current medical situation, she is a engineer, civil (consulting) and EMT.  Both of her children are in healthcare also.    Ms. Dorlene is open to ongoing conversation with PMT as we continue to help family navigate healthcare decisions.  This nurse practitioner informed  the family that I will be out of the hospital until Monday morning.  I will ask another palliative provider to follow-up with family tomorrow for ongoing PMT support  Emphasized the importance of aligning decisions with the patient's values, goals, and overall disease trajectory.   Questions and concerns addressed   Discussed with primary team and nursing staff  Time: 50 minutes  Detailed review of medical records ( labs, imaging, vital signs), medically appropriate exam ( MS, skin, cardiac,  resp)   discussed with treatment team, counseling and education to patient, family, staff, documenting clinical information, medication management, coordination of care    Ronal Plants NP  Palliative Medicine Team Team Phone # [phone] Pager [phone]

## 2024-11-30 NOTE — TOC Progression Note (Signed)
 Transition of Care [facility]) - Progression Note    Patient Details  Name: Andre Stout MRN: [id number] Date of Birth: 06-20-1940  [facility] of [facility]) CM/SW Contact  [name], [place] Phone Number: 11/30/2024, 3:27 PM  Clinical Narrative:   Palliative following. CSW  to follow up on disposition.   Expected Discharge Plan: Skilled Nursing Facility Barriers to Discharge: Continued Medical Work up, SNF Pending bed offer, English As A Second Language Teacher               Expected Discharge Plan and Services In-house Referral: Clinical Social Work   Post Acute Care Choice: Skilled Nursing Facility Living arrangements for the past 2 months: Single Family Home                 DME Arranged: N/A DME Agency: NA       HH Arranged: NA HH Agency: NA         Social Drivers of Health (SDOH) Interventions SDOH Screenings   Food Insecurity: No Food Insecurity (11/03/2024)  Housing: Low Risk  (11/03/2024)  Transportation Needs: Patient Unable To Answer (11/03/2024)  Utilities: Patient Unable To Answer (11/03/2024)  Alcohol Screen: Low Risk  (07/24/2023)  Depression (PHQ2-9): Low Risk  (07/24/2023)  Financial Resource Strain: Low Risk  (10/26/2024)  Physical Activity: Inactive (10/26/2024)  Social Connections: Unknown (11/03/2024)  Recent Concern: Social Connections - Socially Isolated (10/26/2024)  Stress: No Stress Concern Present (10/26/2024)  Tobacco Use: Medium Risk (11/21/2024)  Health Literacy: Adequate Health Literacy (07/24/2023)    Readmission Risk Interventions    11/06/2024   10:49 AM 08/15/2022    9:25 AM  Readmission Risk Prevention Plan  Transportation Screening Complete Complete  PCP or Specialist Appt within 5-7 Days  Complete  Home Care Screening  Complete  Medication Review (RN CM)  Complete  Medication Review (RN Care Manager) Complete   PCP or Specialist appointment within 3-5 days of discharge Complete   HRI or Home Care Consult Complete   Palliative Care  Screening Not Applicable   Skilled Nursing Facility Not Applicable

## 2024-12-01 LAB — GLUCOSE, CAPILLARY
Glucose-Capillary: 474 mg/dL — ABNORMAL HIGH (ref 70–99)
Glucose-Capillary: 488 mg/dL — ABNORMAL HIGH (ref 70–99)
Glucose-Capillary: 150 mg/dL — ABNORMAL HIGH (ref 70–99)
Glucose-Capillary: 247 mg/dL — ABNORMAL HIGH (ref 70–99)
Glucose-Capillary: 100 mg/dL — ABNORMAL HIGH (ref 70–99)

## 2024-12-01 LAB — BASIC METABOLIC PANEL WITH GFR
Anion gap: 8 (ref 5–15)
Anion gap: 11 (ref 5–15)
BUN: 64 mg/dL — ABNORMAL HIGH (ref 8–23)
BUN: 67 mg/dL — ABNORMAL HIGH (ref 8–23)
CO2: 32 mmol/L (ref 22–32)
CO2: 29 mmol/L (ref 22–32)
Calcium: 9.5 mg/dL (ref 8.9–10.3)
Calcium: 9.9 mg/dL (ref 8.9–10.3)
Chloride: 109 mmol/L (ref 98–111)
Chloride: 115 mmol/L — ABNORMAL HIGH (ref 98–111)
Creatinine, Ser: 1.75 mg/dL — ABNORMAL HIGH (ref 0.61–1.24)
Creatinine, Ser: 1.75 mg/dL — ABNORMAL HIGH (ref 0.61–1.24)
GFR, Estimated: 38 mL/min — ABNORMAL LOW (ref >60)
GFR, Estimated: 38 mL/min — ABNORMAL LOW (ref >60)
Glucose, Bld: 493 mg/dL — ABNORMAL HIGH (ref 70–99)
Glucose, Bld: 95 mg/dL (ref 70–99)
Potassium: 4.3 mmol/L (ref 3.5–5.1)
Potassium: 3.9 mmol/L (ref 3.5–5.1)
Sodium: 149 mmol/L — ABNORMAL HIGH (ref 135–145)
Sodium: 155 mmol/L — ABNORMAL HIGH (ref 135–145)

## 2024-12-01 LAB — CBC
HCT: 37 % — ABNORMAL LOW (ref 39.0–52.0)
Hemoglobin: 11.4 g/dL — ABNORMAL LOW (ref 13.0–17.0)
MCH: 25.3 pg — ABNORMAL LOW (ref 26.0–34.0)
MCHC: 30.8 g/dL (ref 30.0–36.0)
MCV: 82.2 fL (ref 80.0–100.0)
Platelets: 303 K/uL (ref 150–400)
RBC: 4.5 MIL/uL (ref 4.22–5.81)
RDW: 18.8 % — ABNORMAL HIGH (ref 11.5–15.5)
WBC: 11.4 K/uL — ABNORMAL HIGH (ref 4.0–10.5)
nRBC: 0.4 % — ABNORMAL HIGH (ref 0.0–0.2)

## 2024-12-01 LAB — MAGNESIUM: Magnesium: 1.9 mg/dL (ref 1.7–2.4)

## 2024-12-01 MED ORDER — INSULIN ASPART 100 UNIT/ML IJ SOLN
4.0000 [IU] | Freq: Three times a day (TID) | INTRAMUSCULAR | Status: DC
  Administered 2024-12-01 – 2024-12-02 (×3): 4 [IU] via SUBCUTANEOUS
  Filled 2024-12-01: qty 1

## 2024-12-01 MED ORDER — COLLAGENASE 250 UNIT/GM EX OINT
TOPICAL_OINTMENT | Freq: Every day | CUTANEOUS | Status: DC
  Filled 2024-12-01: qty 30

## 2024-12-01 MED ORDER — LACTATED RINGERS IV BOLUS: 500.0000 mL | Freq: Once | INTRAVENOUS | Status: DC

## 2024-12-01 MED ORDER — INSULIN ASPART 100 UNIT/ML IJ SOLN
0.0000 [IU] | Freq: Three times a day (TID) | INTRAMUSCULAR | Status: DC
  Administered 2024-12-01: 7 [IU] via SUBCUTANEOUS
  Administered 2024-12-01 – 2024-12-02 (×2): 3 [IU] via SUBCUTANEOUS
  Administered 2024-12-02: 15 [IU] via SUBCUTANEOUS
  Filled 2024-12-01 (×4): qty 1

## 2024-12-01 MED ORDER — SODIUM CHLORIDE 0.45 % IV SOLN: INTRAVENOUS | Status: AC

## 2024-12-01 MED ORDER — INSULIN ASPART 100 UNIT/ML IJ SOLN
0.0000 [IU] | Freq: Every day | INTRAMUSCULAR | Status: DC
  Filled 2024-12-01: qty 1

## 2024-12-01 NOTE — Inpatient Diabetes Management (Signed)
 Inpatient Diabetes Program Recommendations  AACE/ADA: New Consensus Statement on Inpatient Glycemic Control (2015)  Target Ranges:  Prepandial:   less than 140 mg/dL      Peak postprandial:   less than 180 mg/dL (1-2 hours)      Critically ill patients:  140 - 180 mg/dL   Lab Results  Component Value Date   GLUCAP 488 (H) 12/01/2024   HGBA1C 11.4 (H) 11/25/2024    Review of Glycemic Control  Latest Reference Range & Units 11/29/24 15:58 11/29/24 21:02 11/30/24 06:16 11/30/24 11:23 11/30/24 15:46 11/30/24 20:32 12/01/24 06:24 12/01/24 06:27  Glucose-Capillary 70 - 99 mg/dL 870 (H) 776 (H) 696 (H) 205 (H) 164 (H) 214 (H) 474 (H) 488 (H)  (H): Data is abnormally high Diabetes history: Type 2 DM Outpatient Diabetes medications: Lantus  15 units every day, Novolog  5-7 units TID Current orders for Inpatient glycemic control: Semglee  30 units QD, Novolog  0-15 units TID & HS, Novolog  4 units TID   Inpatient Diabetes Program Recommendations:     Consider adding additional dose of Semglee  10 units at bedtime.  Secure chat sent to RN to determine intake overnight. Per RN report, patient is not consuming anything throughout night due to requiring the need for someone to feed him.  0200 CBG/serum already in place.  Thanks, Tinnie Minus, MSN, RNC-OB Diabetes Coordinator [phone] (8a-5p)

## 2024-12-01 NOTE — Consult Note (Signed)
 WOC Nurse Consult Note: Reason for Consult: Consult requested for sacrum.  Performed remotely after review of progress notes and photos in the EMR.  Sacrum was noted to have a Stage 2 pressure injury which was present on admission, according to the bedside nurse's wound care flow sheet.  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  Pressure Injury POA: No   Pt could benefit from a surgical consult for possible sharp debridement if aggressive plan of care is desired.  Family is currently considering palliative care goals, according to the progress notes.   Dressing procedure/placement/frequency: Topical treatment orders provided for bedside nurses to perform as follows to assist with removal of nonviable tissue: Apply Santyl to sacrum wound Q day, then cover with moist gauze and foam dressing.  Change foam dressing Q 3 days or PRN soiling.  WOC team will reassess Q 7-10 days to determine if a change in the plan of care is indicated at that time. Thank-you,  Stephane Fought MSN, RN, CWOCN, CWCN-AP, CNS Contact Mon-Fri 0700-1500: [phone]

## 2024-12-01 NOTE — Plan of Care (Signed)
  Problem: Education: Goal: Knowledge of General Education information will improve Description: Including pain rating scale, medication(s)/side effects and non-pharmacologic comfort measures Outcome: Progressing   Problem: Clinical Measurements: Goal: Ability to maintain clinical measurements within normal limits will improve Outcome: Progressing   Problem: Coping: Goal: Level of anxiety will decrease Outcome: Progressing   

## 2024-12-01 NOTE — Progress Notes (Signed)
 Palliative Medicine Inpatient Follow Up Note HPI: 84/M chronically ill with Dementia, Type 2 diabetes mellitus, hypertension, chronic anemia, complete heart block, prostate cancer, recently hospitalized  with failure to thrive, poor oral intake and diarrhea, noted to be in CHF exacerbation, AKI, hyponatremia and multifocal pneumonia, treated with antibiotics, diuresed, there was discussion on palliative care but not pursued.     Brought to the ED 11/22 with increased weakness and confusion, also history of worsening cough for few days, some dyspnea. CT chest  noted large focal areas of pulmonary consolidation right upper lobe and left lower lobe with progression of right upper lobe disease and moderate left and minimal right pleural effusions improved since prior study.  Palliative care is involved to support additional goals of care conversations.   Today's Discussion 12/01/2024  *Please note that this is a verbal dictation therefore any spelling or grammatical errors are due to the Dragon Medical One system interpretation.  I reviewed the chart notes including nursing notes from today, progress notes from today. I also reviewed vital signs, nursing flowsheets, medication administrations record, labs, and imaging.    Concern associated with h/o CHF, dementia & the chronic progressive nature of these diseases (+)what to anticipate overtime.   Oral Intake %:  100% 1:1 feedings I/O:  (-)480 Bowel Movements:  Last 11/26 Mobility: Limited - would need rehabilitation  I called Denney this morning, he is awake and alert to self. He is not in any distress this morning.   I called and spoke with patients daughter, Michaelle Amble. Created space and opportunity for her to explore thoughts feelings and fears regarding her fathers current medical situation. We reviewed his fragile health at this time. I shared the concerns of the medical teams in the setting of his severe dysphagia. We discussed the  ongoing high risk for aspirational events and re-hospitalizations.   Michaelle Amble shares that she and Dr. Tobie agreed to give Ravindra a few more days to see if his kindney function, glucose, and sodium can improve.  Options at this time include skilled nursing placement versus transition to comfort care and inpatient hospice.   Reviewed what the transition to comfort care and hospice would look like with Michaelle Amble in addition to inpatient hospice placement.  Plan to allow additional time to see if patients current clinical instability can be optimized.  Additional conversations in the oncoming days.   Questions and concerns addressed/Palliative Support Provided.   Objective Assessment: Vital Signs Vitals:   12/01/24 0405 12/01/24 0750  BP: (!) 127/58 (!) 114/50  Pulse: 70 77  Resp: 18 17  Temp: 97.7 F (36.5 C) 97.9 F (36.6 C)  SpO2: 100% 100%    Intake/Output Summary (Last 24 hours) at 12/01/2024 1044 Last data filed at 12/01/2024 9074 Gross per 24 hour  Intake 357 ml  Output 900 ml  Net -543 ml   Last Weight  Most recent update: 12/01/2024  4:40 AM    Weight  55.4 kg (122 lb 2.2 oz)            Gen:  Frail elderly Caucasian M chronically ill appearing HEENT: moist mucous membranes CV: Regular rate and rhythm  PULM:  On RA, breathing is even and nonlabored ABD: soft/nontender  EXT: No edema  Neuro: Alert and oriented x1  SUMMARY OF RECOMMENDATIONS   DNAR/DNI  Continue present care allowing time for outcomes --> Patients daughter shares the primary medical team is working on optimizing blood glucose, creatinine, and sodium levels  Discussed patients current clinical state and concerns moving forward with patients daughter, Michaelle Amble --> more notably in the setting of severe dysphagia  Discussed what comfort care and inpatient hospice would entail  The PMT will remain  involved ______________________________________________________________________________________ [name] [facility] Palliative Medicine Team Team Cell Phone: [phone] Please utilize secure chat with additional questions, if there is no response within 30 minutes please call the above phone number  Time Spent: 64  Palliative Medicine Team providers are available by phone from 7am to 7pm daily and can be reached through the team cell phone.  Should this patient require assistance outside of these hours, please call the patient's attending physician.

## 2024-12-01 NOTE — Progress Notes (Signed)
 [facility] Progress Note Patient: Andre Stout FMW:[id number] DOB: 06-15-1940  DOA: 11/21/2024 DOS: the patient was seen and examined on 12/01/2024  Brief Hospital Course: 84/M chronically ill with Dementia, Type 2 diabetes mellitus, hypertension, chronic anemia, complete heart block, prostate cancer, recently hospitalized 11/2-7 presented with failure to thrive, poor oral intake and diarrhea, noted to be in CHF exacerbation, AKI, hyponatremia and multifocal pneumonia, treated with antibiotics, diuresed, there was discussion on palliative care but not pursued.  Brought to the ED 11/22 with increased weakness and confusion, also history of worsening cough for few days, some dyspnea.  In the ED afebrile, vital stable, CBG over 300, WBC 14.8, lactate 4.9, BNP 1240, troponin 23, creatinine 1.6, chest x-ray noted new right upper lobe consolidation concerning for developing pneumonia, CT chest abdomen pelvis noted large focal areas of pulmonary consolidation right upper lobe and left lower lobe with progression of right upper lobe disease and moderate left and minimal right pleural effusions improved since prior study. - Admitted, started on antibiotics, SLP eval ongoing - 11/24, labs concerning for DKA, CBGs in the 800 range - Palliative consult, remains DNR, patient was reluctant to consider hospice - 11/26 recurrent respiratory distress, concern for aspiration again, repeat CXR w/ worsening Pneumonia - Significant failure to thrive, minimal p.o. intake, ongoing aspiration - 11/27, overnight with respiratory distress, per staff aspiration with all p.o.'s> called daughter and recommended comfort care -11/28: Continued failure to thrive,  11/30 - 12/2.  Oral intake improving.  Now with hyperglycemia causing dehydration with hypernatremia.   Assessment and Plan: Severe sepsis secondary to pneumonia Severe dysphagia Patient presented with reports of increased weakness with reports of increasing  cough over the last 2 days.  Found to be tachypneic with white blood cell count elevated at 14.8 and initial lactic acid 4.9.  CT chest abdomen pelvis revealed concern for large focal areas of pulmonary consolidation in the right upper lobe and left lower lobe with progression of the right upper lobe consolidation. Treated with IV Zosyn  initially and then switched to oral Augmentin .  Antibiotic course completed on 12/1. SLP following, MBS completed, noted to have oropharyngeal dysphagia, on dysphagia 1 diet.  Thin liquid. Blood cultures negative thus far Pulmonary toilet, add DuoNebs, Mucinex     DKA Type 2 diabetes mellitus uncontrolled with hyperglycemia Insulin  was held on admission as he was hypoglycemic recent admission, developed significant hyperglycemia and DKA earlier this admission was getting excessive amounts of Glucerna Now off insulin  gtt., continue glargine, add SSI. Suspect hyperglycemia causing polyuria.  Causing hyponatremia.  Monitor.  Receiving IV fluid.   Dementia, without behavioral issues memory and cognitive decline for 3years   Intermittent complete heart block -Followed by cardiology as recent as few weeks ago, patient declined PPM, also felt to be a poor candidate for invasive management   Chronic combined CHF - Last echo few weeks ago in 11/25 with a EF 35-40% with wall motion abnormality, mildly reduced RV  -EF noted to be depressed at 35% per last echocardiogram on 11/3.  Seen by cards, on account of numerous comorbidities no further invasive evaluation was recommended. - Now on 20 mg-dose torsemide .  Further adjustment of GDMT limited due to renal function as well as soft blood pressures. Holding diuresis as serum sodium trending up.  Treating with half-normal saline.   Acute metabolic encephalopathy Cognitive impairment Patient noted to be acutely altered and unable to recognize family or location.  At baseline reportedly is able to recognize family and  location although normally does not know the year.  Possibly to be multifactorial in nature - TSH was normal recently, B12 significantly low, started on replacement -delirium precautions   Hyponatremia now with hypernatremia Low sodium is resolved.  Now hypernatremia likely from excessive urination in the setting of hyperglycemia. Diuresis held.  Treated with fluid.   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  Diuresis held.   Microcytic anemia Stable   Subjective: No nausea no vomiting no fever no chills.  No chest pain.  Physical Exam: Basal crackles. S1-S2 present Bowel sounds present No edema. No focal deficit.  Alert and awake and oriented to self.  Data Reviewed: I have Reviewed nursing notes, Vitals, and Lab results. Since last encounter, pertinent lab results CBC and BMP   . I have ordered test including CBC and BMP  .   Disposition: Status is: Inpatient Remains inpatient appropriate because: Monitor for improvement in sodium level, glucose level and renal function.  Eventual plan to go to SNF.  enoxaparin  (LOVENOX ) injection 30 mg Start: 11/23/24 2200  Family Communication: No one at bedside Level of care: Telemetry   Vitals:   12/01/24 0405 12/01/24 0750 12/01/24 1144 12/01/24 1558  BP: (!) 127/58 (!) 114/50 (!) 101/45 (!) 106/51  Pulse: 70 77 66 63  Resp: 18 17 20 18   Temp: 97.7 F (36.5 C) 97.9 F (36.6 C) 98.3 F (36.8 C) 97.7 F (36.5 C)  TempSrc: Oral Oral Oral Oral  SpO2: 100% 100% 99% 97%  Weight: 55.4 kg     Height:         Author: Yetta Blanch, MD 12/01/2024 7:22 PM  Please look on [email or web address] to find out who is on call.

## 2024-12-01 NOTE — TOC Progression Note (Addendum)
 Transition of Care [facility]) - Progression Note    Patient Details  Name: Andre Stout MRN: [id number] Date of Birth: Jan 04, 1940  Transition of Care [facility]) CM/SW Contact  [name], [place] Phone Number: 12/01/2024, 10:22 AM  Clinical Narrative:   CSW spoke with patients daughter in regard to discharge planning. Michaelle Amble stated that she is communicating with Palliative and family is trying to decided best plan for patient. Michaelle Amble stated she is suppose to speak with Palliative again today.   4:46 PM CSW followed up with Michaelle Amble and provided medicare.gov ratings of accepting short term rehab facilities to her email [email or web address].  4:50 PM Michaelle called CSW back and stated she did not receive the email. Michaelle provided another email ([email or web address]) for CSW to send the offers. Michaelle confirmed that she received the email.   CSW will continue to follow.    Expected Discharge Plan: Skilled Nursing Facility Barriers to Discharge: Continued Medical Work up, SNF Pending bed offer, English As A Second Language Teacher               Expected Discharge Plan and Services In-house Referral: Clinical Social Work   Post Acute Care Choice: Skilled Nursing Facility Living arrangements for the past 2 months: Single Family Home                 DME Arranged: N/A DME Agency: NA       HH Arranged: NA HH Agency: NA         Social Drivers of Health (SDOH) Interventions SDOH Screenings   Food Insecurity: No Food Insecurity (11/03/2024)  Housing: Low Risk  (11/03/2024)  Transportation Needs: Patient Unable To Answer (11/03/2024)  Utilities: Patient Unable To Answer (11/03/2024)  Alcohol Screen: Low Risk  (07/24/2023)  Depression (PHQ2-9): Low Risk  (07/24/2023)  Financial Resource Strain: Low Risk  (10/26/2024)  Physical Activity: Inactive (10/26/2024)  Social Connections: Unknown (11/03/2024)  Recent Concern: Social Connections - Socially Isolated (10/26/2024)  Stress: No Stress  Concern Present (10/26/2024)  Tobacco Use: Medium Risk (11/21/2024)  Health Literacy: Adequate Health Literacy (07/24/2023)    Readmission Risk Interventions    11/06/2024   10:49 AM 08/15/2022    9:25 AM  Readmission Risk Prevention Plan  Transportation Screening Complete Complete  PCP or Specialist Appt within 5-7 Days  Complete  Home Care Screening  Complete  Medication Review (RN CM)  Complete  Medication Review (RN Care Manager) Complete   PCP or Specialist appointment within 3-5 days of discharge Complete   HRI or Home Care Consult Complete   Palliative Care Screening Not Applicable   Skilled Nursing Facility Not Applicable

## 2024-12-01 NOTE — Care Management Important Message (Signed)
 Important Message  Patient Details  Name: Andre Stout MRN: [id number] Date of Birth: Sep 17, 1940   Important Message Given:        [name] 12/01/2024, 12:18 PM

## 2024-12-02 DIAGNOSIS — Z7189 Other specified counseling: Secondary | ICD-10-CM | POA: Diagnosis not present

## 2024-12-02 DIAGNOSIS — J189 Pneumonia, unspecified organism: Secondary | ICD-10-CM | POA: Diagnosis not present

## 2024-12-02 DIAGNOSIS — Z515 Encounter for palliative care: Secondary | ICD-10-CM | POA: Diagnosis not present

## 2024-12-02 DIAGNOSIS — R1310 Dysphagia, unspecified: Secondary | ICD-10-CM | POA: Diagnosis not present

## 2024-12-02 LAB — BASIC METABOLIC PANEL WITH GFR
Anion gap: 10 (ref 5–15)
BUN: 65 mg/dL — ABNORMAL HIGH (ref 8–23)
CO2: 29 mmol/L (ref 22–32)
Calcium: 9.7 mg/dL (ref 8.9–10.3)
Chloride: 112 mmol/L — ABNORMAL HIGH (ref 98–111)
Creatinine, Ser: 1.74 mg/dL — ABNORMAL HIGH (ref 0.61–1.24)
GFR, Estimated: 38 mL/min — ABNORMAL LOW (ref >60)
Glucose, Bld: 291 mg/dL — ABNORMAL HIGH (ref 70–99)
Potassium: 4.4 mmol/L (ref 3.5–5.1)
Sodium: 151 mmol/L — ABNORMAL HIGH (ref 135–145)

## 2024-12-02 LAB — CBC
HCT: 39.5 % (ref 39.0–52.0)
Hemoglobin: 11.9 g/dL — ABNORMAL LOW (ref 13.0–17.0)
MCH: 25.3 pg — ABNORMAL LOW (ref 26.0–34.0)
MCHC: 30.1 g/dL (ref 30.0–36.0)
MCV: 83.9 fL (ref 80.0–100.0)
Platelets: 417 K/uL — ABNORMAL HIGH (ref 150–400)
RBC: 4.71 MIL/uL (ref 4.22–5.81)
RDW: 19 % — ABNORMAL HIGH (ref 11.5–15.5)
WBC: 15.8 K/uL — ABNORMAL HIGH (ref 4.0–10.5)
nRBC: 0 % (ref 0.0–0.2)

## 2024-12-02 LAB — GLUCOSE, CAPILLARY
Glucose-Capillary: 328 mg/dL — ABNORMAL HIGH (ref 70–99)
Glucose-Capillary: 134 mg/dL — ABNORMAL HIGH (ref 70–99)
Glucose-Capillary: 87 mg/dL (ref 70–99)
Glucose-Capillary: 111 mg/dL — ABNORMAL HIGH (ref 70–99)

## 2024-12-02 LAB — MAGNESIUM: Magnesium: 2 mg/dL (ref 1.7–2.4)

## 2024-12-02 MED ORDER — INSULIN GLARGINE-YFGN 100 UNIT/ML ~~LOC~~ SOLN
40.0000 [IU] | Freq: Every day | SUBCUTANEOUS | Status: DC
  Administered 2024-12-02: 40 [IU] via SUBCUTANEOUS
  Filled 2024-12-02 (×3): qty 0.4

## 2024-12-02 MED ORDER — ENSURE PLUS HIGH PROTEIN PO LIQD
237.0000 mL | Freq: Two times a day (BID) | ORAL | Status: DC
  Administered 2024-12-03 (×2): 237 mL via ORAL

## 2024-12-02 MED ORDER — LOPERAMIDE HCL 2 MG PO CAPS
2.0000 mg | ORAL_CAPSULE | ORAL | Status: DC | PRN
  Administered 2024-12-02: 2 mg via ORAL
  Filled 2024-12-02: qty 1

## 2024-12-02 MED ORDER — INSULIN ASPART 100 UNIT/ML IJ SOLN
5.0000 [IU] | Freq: Three times a day (TID) | INTRAMUSCULAR | Status: DC
  Administered 2024-12-02 – 2024-12-03 (×2): 5 [IU] via SUBCUTANEOUS
  Filled 2024-12-02 (×2): qty 1

## 2024-12-02 NOTE — Progress Notes (Signed)
 Palliative Medicine Inpatient Follow Up Note HPI: 84/M chronically ill with Dementia, Type 2 diabetes mellitus, hypertension, chronic anemia, complete heart block, prostate cancer, recently hospitalized  with failure to thrive, poor oral intake and diarrhea, noted to be in CHF exacerbation, AKI, hyponatremia and multifocal pneumonia, treated with antibiotics, diuresed, there was discussion on palliative care but not pursued.     Brought to the ED 11/22 with increased weakness and confusion, also history of worsening cough for few days, some dyspnea. CT chest  noted large focal areas of pulmonary consolidation right upper lobe and left lower lobe with progression of right upper lobe disease and moderate left and minimal right pleural effusions improved since prior study.  Palliative care is involved to support additional goals of care conversations.   Today's Discussion 12/02/2024  *Please note that this is a verbal dictation therefore any spelling or grammatical errors are due to the Dragon Medical One system interpretation.  I reviewed the chart notes including nursing notes from today, progress notes from today. I also reviewed vital signs, nursing flowsheets, medication administrations record, labs, and imaging.    I met with Andre Stout this morning. He is awake and alert. He is not in any distress of the time of assessment and does endorse feeling increased hunger. I shared that I would request the nursing staff help him to eat something. I was able to help reposition him in the bed this morning.  I called patients daughter, Andre Stout this afternoon who shares with me the hope to send Andre Stout to rehabilitation. We reviewed his chronic disease burden. She shares understanding of his likely aspirational risk and re-hospitalization. She understands that he is in a cycle of doing poorly, aspirating, being hospitalized, and rebounding to some degree. Andre Stout understands that this cycle is likely to  continue.    Andre Stout and I discussed allowing him a natural passing when he aspirates again, no re-hospitalizing or treating with antibiotics. Rather treating his symptoms with alternative medications to relieve distress. Andre Stout spoke to her family who at this time are not ready to stop antibiotics or mIVF.  Andre Stout and I completed an E-MOST form today, with the following treatment decisions:  Cardiopulmonary Resuscitation: Do Not Attempt Resuscitation (DNR/No CPR)  Medical Interventions: Limited Additional Interventions: Use medical treatment, IV fluids and cardiac monitoring as indicated, DO NOT USE intubation or mechanical ventilation. May consider use of less invasive airway support such as BiPAP or CPAP. Also provide comfort measures. Transfer to the hospital if indicated. Avoid intensive care.   Antibiotics: Antibiotics if indicated  IV Fluids: IV fluids if indicated  Feeding Tube: No feeding tube   Questions and concerns addressed/Palliative Support Provided.   Objective Assessment: Vital Signs Vitals:   12/02/24 0334 12/02/24 0854  BP: (!) 105/56 (!) 105/48  Pulse: 69 90  Resp: 16 (!) 22  Temp: 98.2 F (36.8 C) 98.5 F (36.9 C)  SpO2: 100% 100%    Intake/Output Summary (Last 24 hours) at 12/02/2024 1553 Last data filed at 12/02/2024 1200 Gross per 24 hour  Intake 657 ml  Output --  Net 657 ml   Last Weight  Most recent update: 12/02/2024  3:36 AM    Weight  58.4 kg (128 lb 12 oz)            Gen:  Frail elderly Caucasian M chronically ill appearing HEENT: moist mucous membranes CV: Regular rate and rhythm  PULM:  On RA, breathing is even and nonlabored  ABD: soft/nontender  EXT: No edema  Neuro: Alert and oriented x1  SUMMARY OF RECOMMENDATIONS   DNAR/DNI  E-MOST completed --> At this time patients family would like to continue present modalities of care inclusive of rehospitalization(s), IV antibiotics, and mIVF if inidiacated  Continue present care  allowing time for outcomes --> Primary medical team is working on optimizing blood glucose, creatinine, and sodium levels  Discussed patients current clinical state and concerns moving forward with patients daughter, Andre Stout --> more notably in the setting of severe dysphagia  Discussed what comfort care and inpatient hospice would entail  The PMT will remain involved as needed ______________________________________________________________________________________ [name] [facility] Palliative Medicine Team Team Cell Phone: [phone] Please utilize secure chat with additional questions, if there is no response within 30 minutes please call the above phone number  MDM: High Time: 50  Palliative Medicine Team providers are available by phone from 7am to 7pm daily and can be reached through the team cell phone.  Should this patient require assistance outside of these hours, please call the patient's attending physician.

## 2024-12-02 NOTE — TOC Progression Note (Signed)
 Transition of Care [facility]) - Progression Note    Patient Details  Name: Andre Stout MRN: [id number] Date of Birth: April 17, 1940  Transition of [facility]) CM/SW Contact  [name], [place] Phone Number: 12/02/2024, 1:58 PM  Clinical Narrative:  CSW spoke with [facility] about SNF bed choice. Michaelle stated she is going to tour [name] and [employer].  Michaelle inquired about how hospice homes work in terms of pay. CSW explained that CSW is not privy to that information but that family could inquire with Palliative or a hospice agency about costs.   CSW will continue to follow.    Expected Discharge Plan: Skilled Nursing Facility Barriers to Discharge: Continued Medical Work up, SNF Pending bed offer, English As A Second Language Teacher               Expected Discharge Plan and Services In-house Referral: Clinical Social Work   Post Acute Care Choice: Skilled Nursing Facility Living arrangements for the past 2 months: Single Family Home                 DME Arranged: N/A DME Agency: NA       HH Arranged: NA HH Agency: NA         Social Drivers of Health (SDOH) Interventions SDOH Screenings   Food Insecurity: No Food Insecurity (11/03/2024)  Housing: Low Risk  (11/03/2024)  Transportation Needs: Patient Unable To Answer (11/03/2024)  Utilities: Patient Unable To Answer (11/03/2024)  Alcohol Screen: Low Risk  (07/24/2023)  Depression (PHQ2-9): Low Risk  (07/24/2023)  Financial Resource Strain: Low Risk  (10/26/2024)  Physical Activity: Inactive (10/26/2024)  Social Connections: Unknown (11/03/2024)  Recent Concern: Social Connections - Socially Isolated (10/26/2024)  Stress: No Stress Concern Present (10/26/2024)  Tobacco Use: Medium Risk (11/21/2024)  Health Literacy: Adequate Health Literacy (07/24/2023)    Readmission Risk Interventions    11/06/2024   10:49 AM 08/15/2022    9:25 AM  Readmission Risk Prevention Plan  Transportation Screening Complete Complete   PCP or Specialist Appt within 5-7 Days  Complete  Home Care Screening  Complete  Medication Review (RN CM)  Complete  Medication Review (RN Care Manager) Complete   PCP or Specialist appointment within 3-5 days of discharge Complete   HRI or Home Care Consult Complete   Palliative Care Screening Not Applicable   Skilled Nursing Facility Not Applicable

## 2024-12-02 NOTE — Progress Notes (Signed)
 PROGRESS NOTE    Andre Stout  FMW:[id number] DOB: 11/02/1940 DOA: 11/21/2024 PCP: Pcp, No  84/M chronically ill with Dementia, Type 2 diabetes mellitus, hypertension, chronic anemia, complete heart block, prostate cancer, recently hospitalized 11/2-7 presented with failure to thrive, poor oral intake and diarrhea, noted to be in CHF exacerbation, AKI, hyponatremia and multifocal pneumonia, treated with antibiotics, diuresed, there was discussion on palliative care but not pursued.  Brought to the ED 11/22 with increased weakness and confusion, also history of worsening cough for few days, some dyspnea.  In the ED afebrile, vital stable, CBG over 300, WBC 14.8, lactate 4.9, BNP 1240, troponin 23, creatinine 1.6, chest x-ray noted new right upper lobe consolidation concerning for developing pneumonia, CT chest abdomen pelvis noted large focal areas of pulmonary consolidation right upper lobe and left lower lobe with progression of right upper lobe disease and moderate left and minimal right pleural effusions improved since prior study. - Admitted, started on antibiotics, SLP eval ongoing - 11/24, labs concerning for DKA, CBGs in the 800 range - Palliative consult, remains DNR, patient was reluctant to consider hospice - 11/26 recurrent respiratory distress, concern for aspiration again, repeat CXR w/ worsening Pneumonia - Significant failure to thrive, minimal p.o. intake, ongoing aspiration - 11/27, overnight with respiratory distress, per staff aspiration with all p.o.'s> called daughter and recommended comfort care -11/28: Continued failure to thrive, call daughter again, palliative reconsulted - 11/30-2/2 oral intake improving, then developed worsening hyperglycemia causing dehydration and hypernatremia, started on IV fluids, torsemide  held   Subjective: - Ate breakfast this morning, laying comfortably in bed, minimal interaction, denies any complaints  Assessment and Plan:  Severe sepsis  secondary to pneumonia Dysphagia Patient presented with reports of increased weakness with reports of increasing cough over the last 2 days.  Found to be tachypneic with white blood cell count elevated at 14.8 and initial lactic acid 4.9.  CT chest abdomen pelvis revealed concern for large focal areas of pulmonary consolidation in the right upper lobe and left lower lobe with progression of the right upper lobe consolidation.  Review of records note previous speech therapy evaluation recommended dysphagia II diet - Treated with IV Zosyn  initially and then switched to oral Augmentin , course completed - SLP following, MBS completed, noted to have oropharyngeal dysphagia, - Blood cultures negative thus far -Pulmonary toilet, add DuoNebs, Mucinex  Has significant chronic disease burden, high risk of ongoing aspiration and quick rehospitalization, discussed with daughter recommended comfort focused care on multiple occasions, palliative consulting, plan noted for SNF with outpatient palliative care  DKA Type 2 diabetes mellitus - Insulin  was held on admission as he was hypoglycemic recent admission, developed significant hyperglycemia and DKA earlier this admission was getting excessive amounts of Glucerna - Increase glargine and meal coverage  Hypernatremia - Continue D5 infusion today, recheck sodium this afternoon and in a.m.  Dementia -memory and cognitive decline for 3years  Intermittent complete heart block -Followed by cardiology as recent as few weeks ago, patient declined PPM, also felt to be a poor candidate for invasive management   Chronic combined CHF - Last echo few weeks ago in 11/25 with a EF 35-40% with wall motion abnormality, mildly reduced RV  -EF noted to be depressed at 35% per last echocardiogram on 11/3.  Seen by cards, on account of numerous comorbidities no further invasive evaluation was recommended. - Had been on low-dose torsemide , now held for hyponatremia   Acute  metabolic encephalopathy Cognitive impairment Patient noted to be  acutely altered and unable to recognize family or location.  At baseline reportedly is able to recognize family and location although normally does not know the year.  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 -Baseline appears to be around 1.6, had worsening AKI earlier this admission, now improving   Microcytic anemia Stable     DVT prophylaxis: Lovenox  Code Status: DNR Family Communication: No family at bedside, called and updated daughter this weekend Disposition Plan: Plan for SNF  Consultants:    Procedures:   Antimicrobials:    Objective: Vitals:   12/01/24 1936 12/01/24 2350 12/02/24 0334 12/02/24 0854  BP: (!) 114/58 (!) 108/50 (!) 105/56 (!) 105/48  Pulse: 74 65 69 90  Resp: 19 12 16  (!) 22  Temp: 97.7 F (36.5 C)  98.2 F (36.8 C) 98.5 F (36.9 C)  TempSrc: Oral  Oral Oral  SpO2: 96% 100% 100% 100%  Weight:   58.4 kg   Height:        Intake/Output Summary (Last 24 hours) at 12/02/2024 1114 Last data filed at 12/01/2024 1759 Gross per 24 hour  Intake 487.06 ml  Output --  Net 487.06 ml   Filed Weights   11/30/24 0500 12/01/24 0405 12/02/24 0334  Weight: 55.6 kg 55.4 kg 58.4 kg    Examination:  General exam: Chronically ill, very poor historian, awake alert, oriented to self and partly place only, moderate cognitive deficits Respiratory system: Scattered rhonchi, decreased breath sounds at the bases Cardiovascular system: S1 & S2 heard, RRR.  Abd: nondistended, soft and nontender.Normal bowel sounds heard. Central nervous system: Awake alert, oriented to self only Extremities: no edema Skin: No rashes Psychiatry: Flat affect   Data Reviewed:   CBC: Recent Labs  Lab 11/27/24 0248 11/28/24 0245 11/30/24 0204  12/01/24 0248 12/02/24 0325  WBC 10.3 7.8 9.1 11.4* 15.8*  NEUTROABS  --   --  6.1  --   --   HGB 11.4* 11.5* 11.3* 11.4* 11.9*  HCT 35.2* 36.4* 37.1* 37.0* 39.5  MCV 78.9* 80.7 82.8 82.2 83.9  PLT 257 280 372 303 417*   Basic Metabolic Panel: Recent Labs  Lab 11/29/24 0153 11/30/24 0204 12/01/24 0248 12/01/24 2107 12/02/24 0325  NA 148* 147* 149* 155* 151*  K 3.3* 3.3* 4.3 3.9 4.4  CL 105 108 109 115* 112*  CO2 28 33* 32 29 29  GLUCOSE 265* 312* 493* 95 291*  BUN 59* 59* 64* 67* 65*  CREATININE 1.57* 1.69* 1.75* 1.75* 1.74*  CALCIUM  9.5 9.6 9.5 9.9 9.7  MG  --  2.0 1.9  --  2.0   GFR: Estimated Creatinine Clearance: 26.1 mL/min (A) (by C-G formula based on SCr of 1.74 mg/dL (H)). Liver Function Tests: Recent Labs  Lab 11/30/24 0204  AST 19  ALT 15  ALKPHOS 64  BILITOT 0.7  PROT 6.7  ALBUMIN  2.5*   No results for input(s): LIPASE, AMYLASE in the last 168 hours.  No results for input(s): AMMONIA in the last 168 hours. Coagulation Profile: No results for input(s): INR, PROTIME in the last 168 hours. Cardiac Enzymes: No results for input(s): CKTOTAL, CKMB, CKMBINDEX, TROPONINI in the last 168 hours. BNP (last 3 results) No results for input(s): PROBNP in the last 8760 hours. HbA1C: No results for input(s): HGBA1C in the last 72 hours.  CBG: Recent Labs  Lab  12/01/24 0627 12/01/24 1145 12/01/24 1554 12/01/24 2042 12/02/24 0649  GLUCAP 488* 150* 247* 100* 328*   Lipid Profile: No results for input(s): CHOL, HDL, LDLCALC, TRIG, CHOLHDL, LDLDIRECT in the last 72 hours. Thyroid  Function Tests: No results for input(s): TSH, T4TOTAL, FREET4, T3FREE, THYROIDAB in the last 72 hours. Anemia Panel: No results for input(s): VITAMINB12, FOLATE, FERRITIN, TIBC, IRON, RETICCTPCT in the last 72 hours. Urine analysis:    Component Value Date/Time   COLORURINE YELLOW 11/21/2024 1229   APPEARANCEUR CLEAR  11/21/2024 1229   LABSPEC 1.013 11/21/2024 1229   PHURINE 7.0 11/21/2024 1229   GLUCOSEU >=500 (A) 11/21/2024 1229   HGBUR NEGATIVE 11/21/2024 1229   BILIRUBINUR NEGATIVE 11/21/2024 1229   KETONESUR NEGATIVE 11/21/2024 1229   PROTEINUR 100 (A) 11/21/2024 1229   UROBILINOGEN 1.0 10/04/2009 1109   NITRITE NEGATIVE 11/21/2024 1229   LEUKOCYTESUR NEGATIVE 11/21/2024 1229   Sepsis Labs: @LABRCNTIP (procalcitonin:4,lacticidven:4)  ) No results found for this or any previous visit (from the past 240 hours).    Radiology Studies: No results found.    Scheduled Meds:  atorvastatin   40 mg Oral Daily   collagenase   Topical Daily   vitamin B-12  1,000 mcg Oral Daily   enoxaparin  (LOVENOX ) injection  30 mg Subcutaneous Q24H   ferrous sulfate   325 mg Oral Q breakfast   insulin  aspart  0-20 Units Subcutaneous TID WC   insulin  aspart  0-5 Units Subcutaneous QHS   insulin  aspart  5 Units Subcutaneous TID WC   insulin  glargine-yfgn  40 Units Subcutaneous Daily   pantoprazole   40 mg Oral Daily   sodium chloride  flush  3 mL Intravenous Q12H   Continuous Infusions:     LOS: 11 days    Time spent:    Sigurd Pac, MD [facility]   12/02/2024, 11:14 AM

## 2024-12-02 NOTE — Progress Notes (Signed)
 Pharmacist Heart Failure Core Measure Documentation  Assessment: Andre Stout has an EF documented as 35-40% on 11/01/2024 by ECHO.  Rationale: Heart failure patients with left ventricular systolic dysfunction (LVSD) and an EF < 40% should be prescribed an angiotensin converting enzyme inhibitor (ACEI) or angiotensin receptor blocker (ARB) at discharge unless a contraindication is documented in the medical record.  This patient is not currently on an ACEI or ARB for HF.  This note is being placed in the record in order to provide documentation that a contraindication to the use of these agents is present for this encounter.  ACE Inhibitor or Angiotensin Receptor Blocker is contraindicated (specify all that apply)  []   ACEI allergy AND ARB allergy []   Angioedema []   Moderate or severe aortic stenosis []   Hyperkalemia []   Hypotension []   Renal artery stenosis [x]   Worsening renal function, preexisting renal disease or dysfunction   Thank you for allowing pharmacy to participate in this patient's care,  Suzen Sour, PharmD, BCCCP Clinical Pharmacist  Phone: [phone] 12/02/2024 3:12 PM  Please check AMION for all [facility] phone numbers After 10:00 PM, call Main Pharmacy [phone]

## 2024-12-02 NOTE — Progress Notes (Signed)
 Physical Therapy Treatment Patient Details Name: Andre Stout MRN: [id number] DOB: 1940-11-14 Today's Date: 12/02/2024   History of Present Illness The pt is an 84 yo male presenting 11/22 with weakness and BLE swelling. Admitted for sepsis due to PNA. PMH includes: dementia, AV block, HTN, T2DM, prostate CA, glaucoma, CHF, CVA, HLD    PT Comments  Pt admitted with above diagnosis. Pt was able to sit EOB for 14 min with min assist progressing to CGA. Pt following commands to rub lotion on LEs and wash face. Noted that pt moves right LE better than left LE and seems to use left LE less functionally. Decreased frequency due to pt's disposition and will continue PT and progress pt as able.  Pt currently with functional limitations due to the deficits listed below (see PT Problem List). Pt will benefit from acute skilled PT to increase their independence and safety with mobility to allow discharge.       If plan is discharge home, recommend the following: Two people to help with walking and/or transfers;Two people to help with bathing/dressing/bathroom;Assistance with cooking/housework;Assistance with feeding;Direct supervision/assist for medications management;Direct supervision/assist for financial management;Assist for transportation;Help with stairs or ramp for entrance;Supervision due to cognitive status   Can travel by private vehicle     No  Equipment Recommendations  Wheelchair (measurements PT);Wheelchair cushion (measurements PT);Hoyer lift    Recommendations for [employer]       Precautions / Restrictions Precautions Precautions: Fall Recall of Precautions/Restrictions: Impaired Restrictions Weight Bearing Restrictions Per Provider Order: No     Mobility  Bed Mobility Overal bed mobility: Needs Assistance Bed Mobility: Supine to Sit, Sit to Supine Rolling: Mod assist, Used rails   Supine to sit: Mod assist Sit to supine: Min assist, Used rails   General bed  mobility comments: Pt soiled on arrival. Cleaned pt and changed all linens prior to sitting him up.  Pt required max multimodal cues to complete task, pt very rigid with trunk flexed.  Pt does attempt to move once initiated movement.    Transfers                   General transfer comment: Pt having loose BMS therefore didnt get pt up    Ambulation/Gait                   Stairs             Wheelchair Mobility     Tilt Bed    Modified Rankin (Stroke Patients Only)       Balance Overall balance assessment: Needs assistance Sitting-balance support: Bilateral upper extremity supported, Feet supported, No upper extremity supported Sitting balance-Leahy Scale: Fair Sitting balance - Comments: min A initially progressing to CGA to sit  EOB for up to 14 min. Pt rubbed lotion on his extremities and washed his face                                    Communication Communication Communication: Impaired Factors Affecting Communication: Hearing impaired  Cognition Arousal: Alert Behavior During Therapy: Flat affect   PT - Cognitive impairments: No apparent impairments   Orientation impairments: Place, Time, Situation                   PT - Cognition Comments: pt able to follow simple commands majority of time, poor recall, poor memory Following commands: Impaired Following  commands impaired: Follows one step commands inconsistently    Cueing Cueing Techniques: Verbal cues, Tactile cues, Gestural cues  Exercises General Exercises - Lower Extremity Long Arc Quad: AROM, Both, 20 reps, Seated, Strengthening Hip Flexion/Marching: AROM, Strengthening, Seated, Both, 10 reps Other Exercises Other Exercises: seated functional reach x10    General Comments General comments (skin integrity, edema, etc.): VSS      Pertinent Vitals/Pain Pain Assessment Faces Pain Scale: No hurt    Home Living                          Prior  Function            PT Goals (current goals can now be found in the care plan section) Acute Rehab PT Goals Patient Stated Goal: none stated Progress towards PT goals: Progressing toward goals    Frequency    Min 2X/week      PT Plan      Co-evaluation              AM-PAC PT 6 Clicks Mobility   Outcome Measure  Help needed turning from your back to your side while in a flat bed without using bedrails?: A Lot Help needed moving from lying on your back to sitting on the side of a flat bed without using bedrails?: A Lot Help needed moving to and from a bed to a chair (including a wheelchair)?: Total Help needed standing up from a chair using your arms (e.g., wheelchair or bedside chair)?: A Lot Help needed to walk in hospital room?: Total Help needed climbing 3-5 steps with a railing? : Total 6 Click Score: 9    End of Session Equipment Utilized During Treatment: Gait belt Activity Tolerance: Patient tolerated treatment well;Patient limited by fatigue Patient left: in bed;with call bell/phone within reach;with bed alarm set Nurse Communication: Mobility status PT Visit Diagnosis: Other abnormalities of gait and mobility (R26.89);Difficulty in walking, not elsewhere classified (R26.2);Muscle weakness (generalized) (M62.81)     Time: 8896-8864 PT Time Calculation (min) (ACUTE ONLY): 32 min  Charges:    $Therapeutic Exercise: 8-22 mins $Therapeutic Activity: 8-22 mins PT General Charges $$ ACUTE PT VISIT: 1 Visit                     Jerianne Anselmo M,PT Acute Rehab Services [phone]    Stephane JULIANNA Bevel 12/02/2024, 2:11 PM

## 2024-12-02 NOTE — Progress Notes (Addendum)
 Speech Language Pathology Treatment: Dysphagia  Patient Details Name: Andre Stout MRN: [id number] DOB: [birth year] Today's Date: 12/02/2024 Time: 8695-8679 SLP Time Calculation (min) (ACUTE ONLY): 16 min  Assessment / Plan / Recommendation Clinical Impression  Patient was awake and sitting up in his bed. His nurse was in the room feeding him lunch upon SLP arrival. Nurse shared that she had no observations of any coughing. SLP fed patient bites of puree and no overt s/s aspiration observed. SLP tried cup sips of nectar thick liquids and no overt s/s aspiration observed. SLP recommending continue puree diet but downgrade liquids to nectar thick as swallow initiation was slightly more timely with it as compared to thin liquids during MBS completed on 11/24/24; Meds crushed in puree. Full supervision and total assist. SLP will continue to follow for diet toleration.     HPI HPI: Andre Stout is an 84 y/o, chronically ill male. Recently hospitalized 11/2-7 due to failure to thrive, poor oral intake and diarrhea, noted to be in CHF exacerbation, AKI, hyponatremia and multifocal pneumonia, treated with antibiotics, diuresed, there was discussion on palliative care but not pursued. Brought to the ED 11/22 with increased weakness and confusion, also history of worsening cough for few days, some dyspnea. Chest x-ray noted new right upper lobe consolidation concerning for developing pneumonia, CT chest abdomen pelvis noted large focal areas of pulmonary consolidation right upper lobe and left lower lobe with progression of right upper lobe disease and moderate left and minimal right pleural effusions improved since prior study. Patient most recently seen by SLP 08/2024 during which he was placed on a dysphagia 1, thin liquid diet. Previously seen in July 2025 with a cognitively based dysphagia. MBS completed 11/25, recommending Dys 1 (puree) solids, thin liquids. Per MD note, patient with recurrent respiratory  distress and concern for aspiration with repeat CXR showing worsening PNA. MD contacted patient's daughter on 11/27 and recommended comfort care. Palliative has been consulted and is following. PMH of type 2 diabetes mellitus, dementia,hypertension, chronic anemia, complete heart block, prostate cancer.      SLP Plan  Continue with current plan of care        Swallow Evaluation Recommendations         Recommendations  Diet recommendations: Dysphagia 1 (puree);Nectar-thick liquid Liquids provided via: Cup Medication Administration: Crushed with puree Supervision: Full supervision/cueing for compensatory strategies;Staff to assist with self feeding Compensations: Slow rate;Small sips/bites Postural Changes and/or Swallow Maneuvers: Seated upright 90 degrees;Upright 30-60 min after meal                  Oral care BID;Staff/trained caregiver to provide oral care   Frequent or constant Supervision/Assistance Dysphagia, oropharyngeal phase (R13.12)     Continue with current plan of care    Damien Hy  Graduate SLP Clinican

## 2024-12-02 NOTE — Inpatient Diabetes Management (Signed)
 Inpatient Diabetes Program Recommendations  AACE/ADA: New Consensus Statement on Inpatient Glycemic Control (2015)  Target Ranges:  Prepandial:   less than 140 mg/dL      Peak postprandial:   less than 180 mg/dL (1-2 hours)      Critically ill patients:  140 - 180 mg/dL    Latest Reference Range & Units 12/01/24 06:24 12/01/24 06:27 12/01/24 11:45 12/01/24 15:54 12/01/24 20:42  Glucose-Capillary 70 - 99 mg/dL 525 (H) 511 (H)  15 units Novolog   150 (H)  7 units Novolog   30 units Semglee  @1009   247 (H)  11 units Novolog  @1740  100 (H)  (H): Data is abnormally high  Latest Reference Range & Units 12/02/24 06:49  Glucose-Capillary 70 - 99 mg/dL 671 (H)  19 units Novolog    (H): Data is abnormally high   Home DM Meds: Lantus  25 units every day Novolog  0-6 units TID + HS Metformin  500 mg BID  Current Orders: Semglee  30 units daily Novolog  Resistant Correction Scale/ SSI (0-20 units) TID AC + HS Novolog  4 units TID with meals   MD- Note AM CBGs have been severely elevated the last few days  May consider increasing the Semglee  to 35 units daily     --Will follow patient during hospitalization--  Adina Rudolpho Arrow RN, MSN, CDCES Diabetes Coordinator Inpatient Glycemic Control Team Team Pager: [phone] (8a-5p)

## 2024-12-03 DIAGNOSIS — Z7189 Other specified counseling: Secondary | ICD-10-CM | POA: Diagnosis not present

## 2024-12-03 DIAGNOSIS — Z515 Encounter for palliative care: Secondary | ICD-10-CM | POA: Diagnosis not present

## 2024-12-03 DIAGNOSIS — R1310 Dysphagia, unspecified: Secondary | ICD-10-CM | POA: Diagnosis not present

## 2024-12-03 DIAGNOSIS — J189 Pneumonia, unspecified organism: Secondary | ICD-10-CM | POA: Diagnosis not present

## 2024-12-03 LAB — CBC
HCT: 39 % (ref 39.0–52.0)
Hemoglobin: 11.7 g/dL — ABNORMAL LOW (ref 13.0–17.0)
MCH: 25.4 pg — ABNORMAL LOW (ref 26.0–34.0)
MCHC: 30 g/dL (ref 30.0–36.0)
MCV: 84.6 fL (ref 80.0–100.0)
Platelets: 400 K/uL (ref 150–400)
RBC: 4.61 MIL/uL (ref 4.22–5.81)
RDW: 19.3 % — ABNORMAL HIGH (ref 11.5–15.5)
WBC: 14.3 K/uL — ABNORMAL HIGH (ref 4.0–10.5)
nRBC: 0 % (ref 0.0–0.2)

## 2024-12-03 LAB — GLUCOSE, CAPILLARY
Glucose-Capillary: 106 mg/dL — ABNORMAL HIGH (ref 70–99)
Glucose-Capillary: 80 mg/dL (ref 70–99)
Glucose-Capillary: 345 mg/dL — ABNORMAL HIGH (ref 70–99)
Glucose-Capillary: 368 mg/dL — ABNORMAL HIGH (ref 70–99)

## 2024-12-03 LAB — BASIC METABOLIC PANEL WITH GFR
Anion gap: 8 (ref 5–15)
BUN: 55 mg/dL — ABNORMAL HIGH (ref 8–23)
CO2: 30 mmol/L (ref 22–32)
Calcium: 9.6 mg/dL (ref 8.9–10.3)
Chloride: 116 mmol/L — ABNORMAL HIGH (ref 98–111)
Creatinine, Ser: 1.57 mg/dL — ABNORMAL HIGH (ref 0.61–1.24)
GFR, Estimated: 43 mL/min — ABNORMAL LOW (ref >60)
Glucose, Bld: 122 mg/dL — ABNORMAL HIGH (ref 70–99)
Potassium: 4.2 mmol/L (ref 3.5–5.1)
Sodium: 154 mmol/L — ABNORMAL HIGH (ref 135–145)

## 2024-12-03 LAB — MAGNESIUM: Magnesium: 2.2 mg/dL (ref 1.7–2.4)

## 2024-12-03 LAB — SODIUM: Sodium: 149 mmol/L — ABNORMAL HIGH (ref 135–145)

## 2024-12-03 MED ORDER — DEXTROSE 5 % IV SOLN: INTRAVENOUS | Status: DC

## 2024-12-03 MED ORDER — INSULIN ASPART 100 UNIT/ML IJ SOLN
4.0000 [IU] | Freq: Three times a day (TID) | INTRAMUSCULAR | Status: DC
  Administered 2024-12-03: 4 [IU] via SUBCUTANEOUS
  Filled 2024-12-03 (×2): qty 1

## 2024-12-03 MED ORDER — INSULIN ASPART 100 UNIT/ML IJ SOLN
3.0000 [IU] | Freq: Once | INTRAMUSCULAR | Status: AC
  Administered 2024-12-03: 3 [IU] via SUBCUTANEOUS
  Filled 2024-12-03: qty 1

## 2024-12-03 MED ORDER — INSULIN ASPART 100 UNIT/ML IJ SOLN
0.0000 [IU] | Freq: Three times a day (TID) | INTRAMUSCULAR | Status: DC
  Administered 2024-12-03: 7 [IU] via SUBCUTANEOUS
  Administered 2024-12-04: 1 [IU] via SUBCUTANEOUS
  Administered 2024-12-04: 3 [IU] via SUBCUTANEOUS
  Administered 2024-12-04: 5 [IU] via SUBCUTANEOUS
  Administered 2024-12-05 – 2024-12-06 (×2): 1 [IU] via SUBCUTANEOUS
  Administered 2024-12-07 (×2): 3 [IU] via SUBCUTANEOUS
  Administered 2024-12-08: 5 [IU] via SUBCUTANEOUS
  Filled 2024-12-03 (×6): qty 1
  Filled 2024-12-03: qty 3
  Filled 2024-12-03 (×2): qty 1

## 2024-12-03 NOTE — Progress Notes (Signed)
 Occupational Therapy Treatment Patient Details Name: Andre Stout MRN: [id number] DOB: [birth year] Today's Date: 12/03/2024   History of present illness The pt is an 84 yo male presenting 11/22 with weakness and BLE swelling. Admitted for sepsis due to PNA. PMH includes: dementia, AV block, HTN, T2DM, prostate CA, glaucoma, CHF, CVA, HLD   OT comments  Pt alert. Answers questions when asked and OT is in central vision. No initiation noted to communicate he was hungry, full or to continue eating. Pt requires hand over hand assist for feeding and grooming. Rolled with moderate assistance and multimodal cues using bed rails for pericare. Patient will benefit from continued inpatient follow up therapy, <3 hours/day.      If plan is discharge home, recommend the following:  Two people to help with walking and/or transfers;A lot of help with bathing/dressing/bathroom;Assistance with cooking/housework;Assistance with feeding;Direct supervision/assist for medications management;Direct supervision/assist for financial management;Assist for transportation;Help with stairs or ramp for entrance   Equipment Recommendations   (defer)    Recommendations for Other Services      Precautions / Restrictions Precautions Precautions: Fall Recall of Precautions/Restrictions: Impaired Restrictions Weight Bearing Restrictions Per Provider Order: No       Mobility Bed Mobility   Bed Mobility: Rolling Rolling: Mod assist, Used rails         General bed mobility comments: rolled for pericare    Transfers                         Balance                                           ADL either performed or assessed with clinical judgement   ADL Overall ADL's : Needs assistance/impaired Eating/Feeding: Total assistance;Bed level Eating/Feeding Details (indicate cue type and reason): hand over hand assist Grooming: Wash/dry face;Bed level;Total assistance                        Toileting- Clothing Manipulation and Hygiene: Total assistance;Bed level              Extremity/Trunk Assessment              Vision   Additional Comments: attends to central visual field primarily   Perception     Praxis     Communication Communication Communication: Impaired Factors Affecting Communication: Hearing impaired;Other (comment) (minimally verbal)   Cognition Arousal: Alert Behavior During Therapy: Flat affect Cognition: Cognition impaired   Orientation impairments: Place, Time, Situation Awareness: Intellectual awareness impaired, Online awareness impaired Memory impairment (select all impairments): Short-term memory, Working civil service fast streamer, Non-declarative long-term memory, Geneticist, Molecular long-term memory Attention impairment (select first level of impairment): Focused attention Executive functioning impairment (select all impairments): Initiation, Sequencing, Reasoning OT - Cognition Comments: will not continue a task even when prompted with multimodal cues                 Following commands: Impaired Following commands impaired: Only follows one step commands consistently      Cueing   Cueing Techniques: Verbal cues, Tactile cues, Gestural cues  Exercises      Shoulder Instructions       General Comments      Pertinent Vitals/ Pain       Pain Assessment Pain Assessment: Faces Faces Pain Scale: No hurt  Home Living  Prior Functioning/Environment              Frequency  Min 1X/week        Progress Toward Goals  OT Goals(current goals can now be found in the care plan section)  Progress towards OT goals: Not progressing toward goals - comment  Acute Rehab OT Goals OT Goal Formulation: Patient unable to participate in goal setting Time For Goal Achievement: 12/07/24 Potential to Achieve Goals: Fair  Plan      Co-evaluation                  AM-PAC OT 6 Clicks Daily Activity     Outcome Measure   Help from another person eating meals?: Total Help from another person taking care of personal grooming?: Total Help from another person toileting, which includes using toliet, bedpan, or urinal?: Total Help from another person bathing (including washing, rinsing, drying)?: Total Help from another person to put on and taking off regular upper body clothing?: Total Help from another person to put on and taking off regular lower body clothing?: Total 6 Click Score: 6    End of Session    OT Visit Diagnosis: Muscle weakness (generalized) (M62.81);Other symptoms and signs involving cognitive function   Activity Tolerance Patient tolerated treatment well   Patient Left in bed;with call bell/phone within reach;with bed alarm set   Nurse Communication          Time: 8794-8755 OT Time Calculation (min): 39 min  Charges: OT General Charges $OT Visit: 1 Visit OT Treatments $Self Care/Home Management : 38-52 mins  Mliss HERO, OTR/L Acute Rehabilitation Services Office: [phone]   Kennth Mliss Helling 12/03/2024, 2:12 PM

## 2024-12-03 NOTE — Inpatient Diabetes Management (Signed)
 Inpatient Diabetes Program Recommendations  AACE/ADA: New Consensus Statement on Inpatient Glycemic Control (2015)  Target Ranges:  Prepandial:   less than 140 mg/dL      Peak postprandial:   less than 180 mg/dL (1-2 hours)      Critically ill patients:  140 - 180 mg/dL   Lab Results  Component Value Date   GLUCAP 80 12/03/2024   HGBA1C 11.4 (H) 11/25/2024    Review of Glycemic Control  Latest Reference Range & Units 12/02/24 06:49 12/02/24 11:50 12/02/24 16:00 12/02/24 20:43 12/03/24 06:14 12/03/24 10:58  Glucose-Capillary 70 - 99 mg/dL 671 (H) 865 (H) 87 888 (H) 106 (H) 80   Diabetes history: DM 2 Home DM Meds: Lantus  25 units every day Novolog  0-6 units TID + HS Metformin  500 mg BID   Current Orders: Semglee  30 units daily Novolog  Resistant Correction Scale/ SSI (0-20 units) TID AC + HS Novolog  4 units TID with meals  Inpatient Diabetes Program Recommendations:    Consider reducing Novolog  correction to sensitive (0-9 units) tid with meals and reduce Novolog  meal coverage to 4 units tid with meals.   Thanks,  Randall Bullocks, RN, BC-ADM Inpatient Diabetes Coordinator Pager [phone]  (8a-5p)

## 2024-12-03 NOTE — Progress Notes (Addendum)
 PROGRESS NOTE    Andre Stout  FMW:[id number] DOB: [birth year] DOA: 11/21/2024 PCP: Pcp, No  84/M chronically ill with Dementia, Type 2 diabetes mellitus, hypertension, chronic anemia, complete heart block, prostate cancer, recently hospitalized 11/2-7 presented with failure to thrive, poor oral intake and diarrhea, noted to be in CHF exacerbation, AKI, hyponatremia and multifocal pneumonia, treated with antibiotics, diuresed, there was discussion on palliative care but not pursued.  Brought to the ED 11/22 with increased weakness and confusion, also history of worsening cough for few days, some dyspnea.  In the ED afebrile, vital stable, CBG over 300, WBC 14.8, lactate 4.9, BNP 1240, troponin 23, creatinine 1.6, chest x-ray noted new right upper lobe consolidation concerning for developing pneumonia, CT chest abdomen pelvis noted large focal areas of pulmonary consolidation right upper lobe and left lower lobe with progression of right upper lobe disease and moderate left and minimal right pleural effusions improved since prior study. - Admitted, started on antibiotics, SLP eval ongoing - 11/24, labs concerning for DKA, CBGs in the 800 range - Palliative consult, remains DNR, patient was reluctant to consider hospice - 11/26 recurrent respiratory distress, concern for aspiration again, repeat CXR w/ worsening Pneumonia - Significant failure to thrive, minimal p.o. intake, ongoing aspiration - 11/27, overnight with respiratory distress, per staff aspiration with all p.o.'s> called daughter and recommended comfort care -11/28: Continued failure to thrive, call daughter again, palliative reconsulted - 11/30-2/2 oral intake improving, then developed worsening hyperglycemia causing dehydration and hypernatremia, started on IV fluids, torsemide  held   Subjective: - Granddaughter at bedside, feeding him dysphagia 1 diet, awake and alert this morning, denies much complaints  Assessment and  Plan:  Severe sepsis secondary to pneumonia Dysphagia Patient presented with reports of increased weakness with reports of increasing cough over the last 2 days.  Found to be tachypneic with white blood cell count elevated at 14.8 and initial lactic acid 4.9.  CT chest abdomen pelvis revealed concern for large focal areas of pulmonary consolidation in the right upper lobe and left lower lobe with progression of the right upper lobe consolidation.  - Treated with IV Zosyn  initially and then switched to oral Augmentin , course completed - SLP following, MBS completed, noted to have oropharyngeal dysphagia,, now on dysphagia 1 diet -Pulmonary toilet, add DuoNebs, Mucinex  Has significant chronic disease burden, high risk of ongoing aspiration and quick rehospitalization, discussed with daughter recommended comfort focused care on multiple occasions, palliative consulting, plan noted for SNF with outpatient palliative care, oral intake better, respiratory status stable for 2 to 3 days now. - Plan for discharge to SNF once sodium has improved, high risk of quick rehospitalization chronic  DKA Type 2 diabetes mellitus - Insulin  was held on admission as he was hypoglycemic recent admission, developed significant hyperglycemia and DKA earlier this admission was getting excessive amounts of Glucerna - Increase glargine and meal coverage  Hypernatremia - Continue D5 infusion again today, recheck sodium level this afternoon and in a.m. - Encouraged increased water intake  Dementia -memory and cognitive decline for 3years  Intermittent complete heart block -Followed by cardiology as recent as few weeks ago, patient declined PPM, also felt to be a poor candidate for invasive management   Chronic combined CHF - Last echo few weeks ago in 11/25 with a EF 35-40% with wall motion abnormality, mildly reduced RV  -EF noted to be depressed at 35% per last echocardiogram on 11/3.  Seen by cards, on account of  numerous comorbidities no further  invasive evaluation was recommended. - Had been on low-dose torsemide , now held for hyponatremia   Acute metabolic encephalopathy Cognitive impairment Patient noted to be acutely altered and unable to recognize family or location.  At baseline reportedly is able to recognize family and location although normally does not know the year.  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 -Baseline appears to be around 1.6, had worsening AKI earlier this admission, now improving   Microcytic anemia Stable     DVT prophylaxis: Lovenox  Code Status: DNR Family Communication: With granddaughter at bedside  Disposition Plan: Plan for SNF  Consultants:    Procedures:   Antimicrobials:    Objective: Vitals:   12/02/24 2345 12/03/24 0335 12/03/24 0745 12/03/24 1100  BP: (!) 84/73 (!) 107/52 (!) 108/55 (!) 95/48  Pulse: 65 91 63 70  Resp: 16 18 17 18   Temp: 98.4 F (36.9 C) 97.7 F (36.5 C) 97.9 F (36.6 C) 97.8 F (36.6 C)  TempSrc: Oral Oral Oral Oral  SpO2: 100% 99% 100% 100%  Weight:  54.8 kg    Height:        Intake/Output Summary (Last 24 hours) at 12/03/2024 1120 Last data filed at 12/03/2024 1002 Gross per 24 hour  Intake 600 ml  Output 650 ml  Net -50 ml   Filed Weights   12/01/24 0405 12/02/24 0334 12/03/24 0335  Weight: 55.4 kg 58.4 kg 54.8 kg    Examination:  General exam: Chronically ill, laying in bed, oriented to self only, alert this morning Respiratory system: Scattered rhonchi, decreased breath sounds at the bases Cardiovascular system: S1 & S2 heard, RRR.  Abd: nondistended, soft and nontender.Normal bowel sounds heard. Central nervous system: Awake alert, oriented to self only Extremities: no edema Skin: No rashes Psychiatry: Flat affect   Data Reviewed:    CBC: Recent Labs  Lab 11/28/24 0245 11/30/24 0204 12/01/24 0248 12/02/24 0325 12/03/24 0317  WBC 7.8 9.1 11.4* 15.8* 14.3*  NEUTROABS  --  6.1  --   --   --   HGB 11.5* 11.3* 11.4* 11.9* 11.7*  HCT 36.4* 37.1* 37.0* 39.5 39.0  MCV 80.7 82.8 82.2 83.9 84.6  PLT 280 372 303 417* 400   Basic Metabolic Panel: Recent Labs  Lab 11/30/24 0204 12/01/24 0248 12/01/24 2107 12/02/24 0325 12/03/24 0317  NA 147* 149* 155* 151* 154*  K 3.3* 4.3 3.9 4.4 4.2  CL 108 109 115* 112* 116*  CO2 33* 32 29 29 30   GLUCOSE 312* 493* 95 291* 122*  BUN 59* 64* 67* 65* 55*  CREATININE 1.69* 1.75* 1.75* 1.74* 1.57*  CALCIUM  9.6 9.5 9.9 9.7 9.6  MG 2.0 1.9  --  2.0 2.2   GFR: Estimated Creatinine Clearance: 27.1 mL/min (A) (by C-G formula based on SCr of 1.57 mg/dL (H)). Liver Function Tests: Recent Labs  Lab 11/30/24 0204  AST 19  ALT 15  ALKPHOS 64  BILITOT 0.7  PROT 6.7  ALBUMIN  2.5*   No results for input(s): LIPASE, AMYLASE in the last 168 hours.  No results for input(s): AMMONIA in the last 168 hours. Coagulation Profile: No results for input(s): INR, PROTIME in the last 168 hours. Cardiac Enzymes: No results for input(s): CKTOTAL, CKMB, CKMBINDEX, TROPONINI in the last 168 hours. BNP (last 3 results) No results for input(s): PROBNP in the last 8760 hours. HbA1C:  No results for input(s): HGBA1C in the last 72 hours.  CBG: Recent Labs  Lab 12/02/24 0649 12/02/24 1150 12/02/24 1600 12/02/24 2043 12/03/24 0614  GLUCAP 328* 134* 87 111* 106*   Lipid Profile: No results for input(s): CHOL, HDL, LDLCALC, TRIG, CHOLHDL, LDLDIRECT in the last 72 hours. Thyroid  Function Tests: No results for input(s): TSH, T4TOTAL, FREET4, T3FREE, THYROIDAB in the last 72 hours. Anemia Panel: No results for input(s): VITAMINB12, FOLATE, FERRITIN, TIBC, IRON, RETICCTPCT in the last 72 hours. Urine analysis:    Component Value  Date/Time   COLORURINE YELLOW 11/21/2024 1229   APPEARANCEUR CLEAR 11/21/2024 1229   LABSPEC 1.013 11/21/2024 1229   PHURINE 7.0 11/21/2024 1229   GLUCOSEU >=500 (A) 11/21/2024 1229   HGBUR NEGATIVE 11/21/2024 1229   BILIRUBINUR NEGATIVE 11/21/2024 1229   KETONESUR NEGATIVE 11/21/2024 1229   PROTEINUR 100 (A) 11/21/2024 1229   UROBILINOGEN 1.0 10/04/2009 1109   NITRITE NEGATIVE 11/21/2024 1229   LEUKOCYTESUR NEGATIVE 11/21/2024 1229   Sepsis Labs: @LABRCNTIP (procalcitonin:4,lacticidven:4)  ) No results found for this or any previous visit (from the past 240 hours).    Radiology Studies: No results found.    Scheduled Meds:  atorvastatin   40 mg Oral Daily   collagenase   Topical Daily   vitamin B-12  1,000 mcg Oral Daily   enoxaparin  (LOVENOX ) injection  30 mg Subcutaneous Q24H   feeding supplement  237 mL Oral BID BM   ferrous sulfate   325 mg Oral Q breakfast   insulin  aspart  0-20 Units Subcutaneous TID WC   insulin  aspart  0-5 Units Subcutaneous QHS   insulin  aspart  5 Units Subcutaneous TID WC   insulin  glargine-yfgn  40 Units Subcutaneous Daily   pantoprazole   40 mg Oral Daily   sodium chloride  flush  3 mL Intravenous Q12H   Continuous Infusions:  dextrose         LOS: 12 days    Time spent:    Sigurd Pac, MD [facility]   12/03/2024, 11:20 AM

## 2024-12-03 NOTE — TOC Progression Note (Signed)
 Transition of Care [facility]) - Progression Note    Patient Details  Name: Andre Stout MRN: [id number] Date of Birth: February 04, 1940  Transition of [facility] CM/SW Contact  [name], [place] Phone Number: 12/03/2024, 3:03 PM  Clinical Narrative:   CSW followed up with patients daughter Michaelle about SNF bed decision. Michaelle stated she is touring facilities Monday and wants to tour before making a decision. CSW explained insurance auth process.  CSW will continue to follow.    Expected Discharge Plan: Skilled Nursing Facility Barriers to Discharge: Continued Medical Work up, SNF Pending bed offer, English As A Second Language Teacher               Expected Discharge Plan and Services In-house Referral: Clinical Social Work   Post Acute Care Choice: Skilled Nursing Facility Living arrangements for the past 2 months: Single Family Home                 DME Arranged: N/A DME Agency: NA       HH Arranged: NA HH Agency: NA         Social Drivers of Health (SDOH) Interventions SDOH Screenings   Food Insecurity: No Food Insecurity (11/03/2024)  Housing: Low Risk  (11/03/2024)  Transportation Needs: Patient Unable To Answer (11/03/2024)  Utilities: Patient Unable To Answer (11/03/2024)  Alcohol Screen: Low Risk  (07/24/2023)  Depression (PHQ2-9): Low Risk  (07/24/2023)  Financial Resource Strain: Low Risk  (10/26/2024)  Physical Activity: Inactive (10/26/2024)  Social Connections: Unknown (11/03/2024)  Recent Concern: Social Connections - Socially Isolated (10/26/2024)  Stress: No Stress Concern Present (10/26/2024)  Tobacco Use: Medium Risk (11/21/2024)  Health Literacy: Adequate Health Literacy (07/24/2023)    Readmission Risk Interventions    11/06/2024   10:49 AM 08/15/2022    9:25 AM  Readmission Risk Prevention Plan  Transportation Screening Complete Complete  PCP or Specialist Appt within 5-7 Days  Complete  Home Care Screening  Complete  Medication Review (RN CM)   Complete  Medication Review (RN Care Manager) Complete   PCP or Specialist appointment within 3-5 days of discharge Complete   HRI or Home Care Consult Complete   Palliative Care Screening Not Applicable   Skilled Nursing Facility Not Applicable

## 2024-12-04 DIAGNOSIS — J189 Pneumonia, unspecified organism: Secondary | ICD-10-CM | POA: Diagnosis not present

## 2024-12-04 DIAGNOSIS — R1310 Dysphagia, unspecified: Secondary | ICD-10-CM | POA: Diagnosis not present

## 2024-12-04 DIAGNOSIS — Z7189 Other specified counseling: Secondary | ICD-10-CM | POA: Diagnosis not present

## 2024-12-04 DIAGNOSIS — Z515 Encounter for palliative care: Secondary | ICD-10-CM | POA: Diagnosis not present

## 2024-12-04 LAB — BASIC METABOLIC PANEL WITH GFR
Anion gap: 15 (ref 5–15)
Anion gap: 9 (ref 5–15)
BUN: 59 mg/dL — ABNORMAL HIGH (ref 8–23)
BUN: 54 mg/dL — ABNORMAL HIGH (ref 8–23)
CO2: 23 mmol/L (ref 22–32)
CO2: 24 mmol/L (ref 22–32)
Calcium: 9.5 mg/dL (ref 8.9–10.3)
Calcium: 8.8 mg/dL — ABNORMAL LOW (ref 8.9–10.3)
Chloride: 116 mmol/L — ABNORMAL HIGH (ref 98–111)
Chloride: 118 mmol/L — ABNORMAL HIGH (ref 98–111)
Creatinine, Ser: 1.61 mg/dL — ABNORMAL HIGH (ref 0.61–1.24)
Creatinine, Ser: 1.55 mg/dL — ABNORMAL HIGH (ref 0.61–1.24)
GFR, Estimated: 42 mL/min — ABNORMAL LOW (ref >60)
GFR, Estimated: 44 mL/min — ABNORMAL LOW (ref >60)
Glucose, Bld: 151 mg/dL — ABNORMAL HIGH (ref 70–99)
Glucose, Bld: 59 mg/dL — ABNORMAL LOW (ref 70–99)
Potassium: 4.1 mmol/L (ref 3.5–5.1)
Potassium: 3.9 mmol/L (ref 3.5–5.1)
Sodium: 154 mmol/L — ABNORMAL HIGH (ref 135–145)
Sodium: 151 mmol/L — ABNORMAL HIGH (ref 135–145)

## 2024-12-04 LAB — GLUCOSE, CAPILLARY
Glucose-Capillary: 135 mg/dL — ABNORMAL HIGH (ref 70–99)
Glucose-Capillary: 278 mg/dL — ABNORMAL HIGH (ref 70–99)
Glucose-Capillary: 218 mg/dL — ABNORMAL HIGH (ref 70–99)
Glucose-Capillary: 128 mg/dL — ABNORMAL HIGH (ref 70–99)
Glucose-Capillary: 46 mg/dL — ABNORMAL LOW (ref 70–99)
Glucose-Capillary: 47 mg/dL — ABNORMAL LOW (ref 70–99)
Glucose-Capillary: 49 mg/dL — ABNORMAL LOW (ref 70–99)
Glucose-Capillary: 93 mg/dL (ref 70–99)

## 2024-12-04 MED ORDER — INSULIN GLARGINE 100 UNIT/ML ~~LOC~~ SOLN
45.0000 [IU] | Freq: Every day | SUBCUTANEOUS | Status: DC
  Administered 2024-12-04 – 2024-12-05 (×2): 45 [IU] via SUBCUTANEOUS
  Filled 2024-12-04 (×2): qty 0.45

## 2024-12-04 MED ORDER — DEXTROSE 5 % IV SOLN: INTRAVENOUS | Status: DC

## 2024-12-04 MED ORDER — INSULIN ASPART 100 UNIT/ML IJ SOLN
5.0000 [IU] | Freq: Three times a day (TID) | INTRAMUSCULAR | Status: DC
  Administered 2024-12-04 – 2024-12-05 (×4): 5 [IU] via SUBCUTANEOUS
  Filled 2024-12-04 (×2): qty 5
  Filled 2024-12-04 (×2): qty 1

## 2024-12-04 NOTE — Plan of Care (Signed)
 ?  Problem: Clinical Measurements: ?Goal: Will remain free from infection ?Outcome: Progressing ?  ?

## 2024-12-04 NOTE — Plan of Care (Signed)
  Problem: Education: Goal: Knowledge of General Education information will improve Description: Including pain rating scale, medication(s)/side effects and non-pharmacologic comfort measures Outcome: Progressing   Problem: Health Behavior/Discharge Planning: Goal: Ability to manage health-related needs will improve Outcome: Progressing   Problem: Clinical Measurements: Goal: Will remain free from infection Outcome: Progressing   Problem: Clinical Measurements: Goal: Diagnostic test results will improve Outcome: Progressing   Problem: Clinical Measurements: Goal: Respiratory complications will improve Outcome: Progressing   Problem: Nutrition: Goal: Adequate nutrition will be maintained Outcome: Progressing

## 2024-12-04 NOTE — Progress Notes (Signed)
 Hypoglycemic Event  CBG: 46  Treatment: 8 oz juice/soda  Symptoms: None  Follow-up CBG: Time:2224 CBG Result:47  Possible Reasons for Event: Inadequate meal intake  Treatment: 8 oz juice  Comments: recheck @ 2253 CBG 93 asymptomatic    Andre Stout Andre Stout

## 2024-12-04 NOTE — Progress Notes (Signed)
 PROGRESS NOTE    Andre Stout  FMW:[id number] DOB: [birth year] DOA: 11/21/2024 PCP: Pcp, No  84/M chronically ill with Dementia, Type 2 diabetes mellitus, hypertension, chronic anemia, complete heart block, prostate cancer, recently hospitalized 11/2-7 presented with failure to thrive, poor oral intake and diarrhea, noted to be in CHF exacerbation, AKI, hyponatremia and multifocal pneumonia, treated with antibiotics, diuresed, there was discussion on palliative care but not pursued.  Brought to the ED 11/22 with increased weakness and confusion, also history of worsening cough for few days, some dyspnea.  In the ED afebrile, vital stable, CBG over 300, WBC 14.8, lactate 4.9, BNP 1240, troponin 23, creatinine 1.6, chest x-ray noted new right upper lobe consolidation concerning for developing pneumonia, CT chest abdomen pelvis noted large focal areas of pulmonary consolidation right upper lobe and left lower lobe with progression of right upper lobe disease and moderate left and minimal right pleural effusions improved since prior study. - Admitted, started on antibiotics, SLP eval ongoing - 11/24, labs concerning for DKA, CBGs in the 800 range - Palliative consult, remains DNR, patient was reluctant to consider hospice - 11/26 recurrent respiratory distress, concern for aspiration again, repeat CXR w/ worsening Pneumonia - Significant failure to thrive, minimal p.o. intake, ongoing aspiration - 11/27, overnight with respiratory distress, per staff aspiration with all p.o.'s> called daughter and recommended comfort care -11/28: Continued failure to thrive, call daughter again, palliative reconsulted - 11/30-2/2 oral intake improving, then developed worsening hyperglycemia causing dehydration and hypernatremia, started on IV fluids, torsemide  held - 12/4-5, sodium still elevated, hyperglycemia, was restarted on Ensure   Subjective: - Patient seen, no events overnight, remains on D5  infusion  Assessment and Plan:  Severe sepsis secondary to pneumonia Dysphagia Patient presented with reports of increased weakness with reports of increasing cough over the last 2 days.  Found to be tachypneic with white blood cell count elevated at 14.8 and initial lactic acid 4.9.  CT chest abdomen pelvis revealed concern for large focal areas of pulmonary consolidation in the right upper lobe and left lower lobe with progression of the right upper lobe consolidation.  - Treated with IV Zosyn  initially and then switched to oral Augmentin , course completed - SLP following, MBS completed, noted to have oropharyngeal dysphagia,, now on dysphagia 1 diet -Pulmonary toilet, add DuoNebs, Mucinex  Has significant chronic disease burden, high risk of ongoing aspiration and quick rehospitalization, discussed with daughter recommended comfort focused care on multiple occasions, they are not ready for this palliative consulting, plan  for SNF with outpatient palliative care, oral intake better, respiratory status stable for 2 to 3 days now. - Plan for discharge to SNF once sodium has improved, high risk of quick rehospitalization   DKA Type 2 diabetes mellitus - Insulin  was held on admission as he was hypoglycemic recent admission, developed significant hyperglycemia and DKA earlier this admission was getting excessive amounts of Glucerna - Increase glargine and meal coverage - CBGs elevated on calorie dense Ensure,, discontinue Ensure  Hypernatremia - Continue D5 infusion again today, increased rate, discontinued Ensure causing hyperglycemia - Encouraged increased water intake  Dementia -memory and cognitive decline for 3years  Intermittent complete heart block -Followed by cardiology as recent as few weeks ago, patient declined PPM, also felt to be a poor candidate for invasive management   Chronic combined CHF - Last echo few weeks ago in 11/25 with a EF 35-40% with wall motion abnormality,  mildly reduced RV  -EF noted to be depressed at 35%  per last echocardiogram on 11/3.  Seen by cards, on account of numerous comorbidities no further invasive evaluation was recommended. - Had been on low-dose torsemide , now held for hyponatremia   Acute metabolic encephalopathy Cognitive impairment Patient noted to be acutely altered and unable to recognize family or location.  At baseline reportedly is able to recognize family and location although normally does not know the year.  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 -Baseline appears to be around 1.6, had worsening AKI earlier this admission, now improving   Microcytic anemia Stable     DVT prophylaxis: Lovenox  Code Status: DNR Family Communication: No family at bedside, discussed with granddaughter yesterday Disposition Plan: Plan for SNF  Consultants:    Procedures:   Antimicrobials:    Objective: Vitals:   12/04/24 0000 12/04/24 0420 12/04/24 0717 12/04/24 0836  BP:  (!) 106/59 (!) 115/57 (!) 93/59  Pulse: 69 62 71 69  Resp:  18 17 16   Temp:  97.7 F (36.5 C)  98.5 F (36.9 C)  TempSrc:  Oral  Oral  SpO2: 98%   95%  Weight:  54.8 kg    Height:        Intake/Output Summary (Last 24 hours) at 12/04/2024 1106 Last data filed at 12/04/2024 0700 Gross per 24 hour  Intake 1435.55 ml  Output 675 ml  Net 760.55 ml   Filed Weights   12/02/24 0334 12/03/24 0335 12/04/24 0420  Weight: 58.4 kg 54.8 kg 54.8 kg    Examination:  General exam: Chronically ill, laying in bed, awake, pleasant, alert and oriented to self this morning, no distress Respiratory system: Few scattered rhonchi, decreased breath sounds at the bases Cardiovascular system: S1 & S2 heard, RRR.  Abd: nondistended, soft and nontender.Normal bowel sounds heard. Central nervous  system: Awake alert, oriented to self only Extremities: no edema Skin: No rashes Psychiatry: Flat affect   Data Reviewed:   CBC: Recent Labs  Lab 11/28/24 0245 11/30/24 0204 12/01/24 0248 12/02/24 0325 12/03/24 0317  WBC 7.8 9.1 11.4* 15.8* 14.3*  NEUTROABS  --  6.1  --   --   --   HGB 11.5* 11.3* 11.4* 11.9* 11.7*  HCT 36.4* 37.1* 37.0* 39.5 39.0  MCV 80.7 82.8 82.2 83.9 84.6  PLT 280 372 303 417* 400   Basic Metabolic Panel: Recent Labs  Lab 11/30/24 0204 12/01/24 0248 12/01/24 2107 12/02/24 0325 12/03/24 0317 12/03/24 1850 12/04/24 0245  NA 147* 149* 155* 151* 154* 149* 154*  K 3.3* 4.3 3.9 4.4 4.2  --  4.1  CL 108 109 115* 112* 116*  --  116*  CO2 33* 32 29 29 30   --  23  GLUCOSE 312* 493* 95 291* 122*  --  151*  BUN 59* 64* 67* 65* 55*  --  59*  CREATININE 1.69* 1.75* 1.75* 1.74* 1.57*  --  1.61*  CALCIUM  9.6 9.5 9.9 9.7 9.6  --  9.5  MG 2.0 1.9  --  2.0 2.2  --   --    GFR: Estimated Creatinine Clearance: 26.5 mL/min (A) (by C-G formula based on SCr of 1.61 mg/dL (H)). Liver Function Tests: Recent Labs  Lab 11/30/24 0204  AST 19  ALT 15  ALKPHOS 64  BILITOT 0.7  PROT 6.7  ALBUMIN  2.5*   No results for input(s): LIPASE, AMYLASE in the  last 168 hours.  No results for input(s): AMMONIA in the last 168 hours. Coagulation Profile: No results for input(s): INR, PROTIME in the last 168 hours. Cardiac Enzymes: No results for input(s): CKTOTAL, CKMB, CKMBINDEX, TROPONINI in the last 168 hours. BNP (last 3 results) No results for input(s): PROBNP in the last 8760 hours. HbA1C: No results for input(s): HGBA1C in the last 72 hours.  CBG: Recent Labs  Lab 12/03/24 1058 12/03/24 1610 12/03/24 2112 12/04/24 0219 12/04/24 0613  GLUCAP 80 345* 368* 135* 278*   Lipid Profile: No results for input(s): CHOL, HDL, LDLCALC, TRIG, CHOLHDL, LDLDIRECT in the last 72 hours. Thyroid  Function Tests: No results for input(s):  TSH, T4TOTAL, FREET4, T3FREE, THYROIDAB in the last 72 hours. Anemia Panel: No results for input(s): VITAMINB12, FOLATE, FERRITIN, TIBC, IRON, RETICCTPCT in the last 72 hours. Urine analysis:    Component Value Date/Time   COLORURINE YELLOW 11/21/2024 1229   APPEARANCEUR CLEAR 11/21/2024 1229   LABSPEC 1.013 11/21/2024 1229   PHURINE 7.0 11/21/2024 1229   GLUCOSEU >=500 (A) 11/21/2024 1229   HGBUR NEGATIVE 11/21/2024 1229   BILIRUBINUR NEGATIVE 11/21/2024 1229   KETONESUR NEGATIVE 11/21/2024 1229   PROTEINUR 100 (A) 11/21/2024 1229   UROBILINOGEN 1.0 10/04/2009 1109   NITRITE NEGATIVE 11/21/2024 1229   LEUKOCYTESUR NEGATIVE 11/21/2024 1229   Sepsis Labs: @LABRCNTIP (procalcitonin:4,lacticidven:4)  ) No results found for this or any previous visit (from the past 240 hours).    Radiology Studies: No results found.    Scheduled Meds:  atorvastatin   40 mg Oral Daily   collagenase    Topical Daily   vitamin B-12  1,000 mcg Oral Daily   enoxaparin  (LOVENOX ) injection  30 mg Subcutaneous Q24H   ferrous sulfate   325 mg Oral Q breakfast   insulin  aspart  0-9 Units Subcutaneous TID WC   insulin  aspart  5 Units Subcutaneous TID WC   insulin  glargine  45 Units Subcutaneous Daily   pantoprazole   40 mg Oral Daily   sodium chloride  flush  3 mL Intravenous Q12H   Continuous Infusions:  dextrose         LOS: 13 days    Time spent:    Sigurd Pac, MD [facility]   12/04/2024, 11:06 AM

## 2024-12-05 DIAGNOSIS — J189 Pneumonia, unspecified organism: Secondary | ICD-10-CM | POA: Diagnosis not present

## 2024-12-05 DIAGNOSIS — Z7189 Other specified counseling: Secondary | ICD-10-CM | POA: Diagnosis not present

## 2024-12-05 DIAGNOSIS — R1310 Dysphagia, unspecified: Secondary | ICD-10-CM | POA: Diagnosis not present

## 2024-12-05 DIAGNOSIS — Z515 Encounter for palliative care: Secondary | ICD-10-CM | POA: Diagnosis not present

## 2024-12-05 LAB — BASIC METABOLIC PANEL WITH GFR
Anion gap: 10 (ref 5–15)
BUN: 48 mg/dL — ABNORMAL HIGH (ref 8–23)
CO2: 22 mmol/L (ref 22–32)
Calcium: 9 mg/dL (ref 8.9–10.3)
Chloride: 112 mmol/L — ABNORMAL HIGH (ref 98–111)
Creatinine, Ser: 1.46 mg/dL — ABNORMAL HIGH (ref 0.61–1.24)
GFR, Estimated: 47 mL/min — ABNORMAL LOW (ref >60)
Glucose, Bld: 126 mg/dL — ABNORMAL HIGH (ref 70–99)
Potassium: 3.9 mmol/L (ref 3.5–5.1)
Sodium: 144 mmol/L (ref 135–145)

## 2024-12-05 LAB — GLUCOSE, CAPILLARY
Glucose-Capillary: 129 mg/dL — ABNORMAL HIGH (ref 70–99)
Glucose-Capillary: 112 mg/dL — ABNORMAL HIGH (ref 70–99)
Glucose-Capillary: 25 mg/dL — CL (ref 70–99)
Glucose-Capillary: 110 mg/dL — ABNORMAL HIGH (ref 70–99)
Glucose-Capillary: 64 mg/dL — ABNORMAL LOW (ref 70–99)
Glucose-Capillary: 64 mg/dL — ABNORMAL LOW (ref 70–99)
Glucose-Capillary: 73 mg/dL (ref 70–99)
Glucose-Capillary: 99 mg/dL (ref 70–99)

## 2024-12-05 MED ORDER — INSULIN ASPART 100 UNIT/ML IJ SOLN: 3.0000 [IU] | Freq: Three times a day (TID) | INTRAMUSCULAR | Status: DC

## 2024-12-05 MED ORDER — DEXTROSE 50 % IV SOLN
25.0000 g | INTRAVENOUS | Status: AC
  Administered 2024-12-05: 25 g via INTRAVENOUS
  Filled 2024-12-05: qty 50

## 2024-12-05 MED ORDER — INSULIN GLARGINE 100 UNIT/ML ~~LOC~~ SOLN
35.0000 [IU] | Freq: Every day | SUBCUTANEOUS | Status: DC
  Filled 2024-12-05: qty 0.35

## 2024-12-05 NOTE — Progress Notes (Signed)
 Hypoglycemic Event  CBG: 25  Treatment: D50 50 mL (25 gm)  Symptoms: None  Follow-up CBG: Time:1645 CBG Result:110  Possible Reasons for Event: Inadequate meal intake  Comments/MD notified: Fairy Frames, MD aware     Andre Stout

## 2024-12-05 NOTE — Progress Notes (Signed)
 PROGRESS NOTE    Andre Stout  FMW:[id number] DOB: [birth year] DOA: 11/21/2024 PCP: Pcp, No  84/M chronically ill with Dementia, Type 2 diabetes mellitus, hypertension, chronic anemia, complete heart block, prostate cancer, recently hospitalized 11/2-7 presented with failure to thrive, poor oral intake and diarrhea, noted to be in CHF exacerbation, AKI, hyponatremia and multifocal pneumonia, treated with antibiotics, diuresed, there was discussion on palliative care but not pursued.  Brought to the ED 11/22 with increased weakness and confusion, also history of worsening cough for few days, some dyspnea.  In the ED afebrile, vital stable, CBG over 300, WBC 14.8, lactate 4.9, BNP 1240, troponin 23, creatinine 1.6, chest x-ray noted new right upper lobe consolidation concerning for developing pneumonia, CT chest abdomen pelvis noted large focal areas of pulmonary consolidation right upper lobe and left lower lobe with progression of right upper lobe disease and moderate left and minimal right pleural effusions improved since prior study. - Admitted, started on antibiotics, SLP eval ongoing - 11/24, labs concerning for DKA, CBGs in the 800 range - Palliative consult, remains DNR, patient was reluctant to consider hospice - 11/26 recurrent respiratory distress, concern for aspiration again, repeat CXR w/ worsening Pneumonia - Significant failure to thrive, minimal p.o. intake, ongoing aspiration - 11/27, overnight with respiratory distress, per staff aspiration with all p.o.'s> called daughter and recommended comfort care -11/28: Continued failure to thrive, call daughter again, palliative reconsulted - 11/30-2/2 oral intake improving, then developed worsening hyperglycemia causing dehydration and hypernatremia, started on IV fluids, torsemide  held - 12/4-5, sodium still elevated, hyperglycemia, was restarted on Ensure - 12/6: Sodium finally normalized, still coughing fair bit with p.o.  intake   Subjective: - More awake and interactive, just ate breakfast, coughing  Assessment and Plan:  Severe sepsis secondary to pneumonia Dysphagia Patient presented with reports of increased weakness with reports of increasing cough over the last 2 days.  Found to be tachypneic with white blood cell count elevated at 14.8 and initial lactic acid 4.9.  CT chest abdomen pelvis revealed concern for large focal areas of pulmonary consolidation in the right upper lobe and left lower lobe with progression of the right upper lobe consolidation.  - Treated with IV Zosyn  initially and then switched to oral Augmentin , course completed - SLP following, MBS completed, noted to have oropharyngeal dysphagia,, now on dysphagia 1 diet -Pulmonary toilet, add DuoNebs, Mucinex  Has significant chronic disease burden, high risk of ongoing aspiration and quick rehospitalization, discussed with daughter recommended comfort focused care on multiple occasions, they are not ready for this palliative consulting, plan  for SNF with outpatient palliative care, oral intake better, respiratory status stable for 2 to 3 days now. - Plan for SNF on Monday, high risk of quick rehospitalization   DKA Type 2 diabetes mellitus - Insulin  was held on admission as he was hypoglycemic recent admission, developed significant hyperglycemia and DKA earlier this admission was getting excessive amounts of Glucerna - Glargine and meal coverage decreased again, Ensure discontinued yesterday  Hypernatremia - Improving with correction of hyperglycemia, Ensure discontinued - Encouraged increased water intake - Discontinue D5 infusion today  Dementia -memory and cognitive decline for 3years  Intermittent complete heart block -Followed by cardiology as recent as few weeks ago, patient declined PPM, also felt to be a poor candidate for invasive management   Chronic combined CHF - Last echo few weeks ago in 11/25 with a EF 35-40% with  wall motion abnormality, mildly reduced RV  -EF noted to be  depressed at 35% per last echocardiogram on 11/3.  Seen by cards, on account of numerous comorbidities no further invasive evaluation was recommended. - Had been on low-dose torsemide , now held for hyponatremia   Acute metabolic encephalopathy Cognitive impairment Patient noted to be acutely altered and unable to recognize family or location.  At baseline reportedly is able to recognize family and location although normally does not know the year.  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 -Baseline appears to be around 1.6, had worsening AKI earlier this admission, now improving   Microcytic anemia Stable     DVT prophylaxis: Lovenox  Code Status: DNR Family Communication: No family at bedside, discussed with granddaughter yesterday Disposition Plan: Plan for SNF on Monday if stable  Consultants:    Procedures:   Antimicrobials:    Objective: Vitals:   12/04/24 1945 12/04/24 2145 12/05/24 0309 12/05/24 0734  BP: 100/64 (!) 110/56 106/68 110/62  Pulse: 64 68 69 70  Resp: 17 18 18 18   Temp: (!) 97.4 F (36.3 C) (!) 97.4 F (36.3 C) (!) 97.4 F (36.3 C) 97.6 F (36.4 C)  TempSrc: Oral Oral Oral Oral  SpO2: 96% 100% 97% 98%  Weight:   58.2 kg   Height:   5' 5 (1.651 m)     Intake/Output Summary (Last 24 hours) at 12/05/2024 1039 Last data filed at 12/05/2024 0818 Gross per 24 hour  Intake 360 ml  Output 300 ml  Net 60 ml   Filed Weights   12/03/24 0335 12/04/24 0420 12/05/24 0309  Weight: 54.8 kg 54.8 kg 58.2 kg    Examination:  General exam: Chronically ill, laying in bed, awake, pleasant, alert and oriented to self this morning, no distress Respiratory system: Few scattered rhonchi, decreased breath sounds at the bases Cardiovascular  system: S1 & S2 heard, RRR.  Abd: nondistended, soft and nontender.Normal bowel sounds heard. Central nervous system: Awake alert, oriented to self only Extremities: no edema Skin: No rashes Psychiatry: Flat affect   Data Reviewed:   CBC: Recent Labs  Lab 11/30/24 0204 12/01/24 0248 12/02/24 0325 12/03/24 0317  WBC 9.1 11.4* 15.8* 14.3*  NEUTROABS 6.1  --   --   --   HGB 11.3* 11.4* 11.9* 11.7*  HCT 37.1* 37.0* 39.5 39.0  MCV 82.8 82.2 83.9 84.6  PLT 372 303 417* 400   Basic Metabolic Panel: Recent Labs  Lab 11/30/24 0204 12/01/24 0248 12/01/24 2107 12/02/24 0325 12/03/24 0317 12/03/24 1850 12/04/24 0245 12/04/24 1720 12/05/24 0245  NA 147* 149*   < > 151* 154* 149* 154* 151* 144  K 3.3* 4.3   < > 4.4 4.2  --  4.1 3.9 3.9  CL 108 109   < > 112* 116*  --  116* 118* 112*  CO2 33* 32   < > 29 30  --  23 24 22   GLUCOSE 312* 493*   < > 291* 122*  --  151* 59* 126*  BUN 59* 64*   < > 65* 55*  --  59* 54* 48*  CREATININE 1.69* 1.75*   < > 1.74* 1.57*  --  1.61* 1.55* 1.46*  CALCIUM  9.6 9.5   < > 9.7 9.6  --  9.5 8.8* 9.0  MG 2.0 1.9  --  2.0 2.2  --   --   --   --    < > =  values in this interval not displayed.   GFR: Estimated Creatinine Clearance: 31 mL/min (A) (by C-G formula based on SCr of 1.46 mg/dL (H)). Liver Function Tests: Recent Labs  Lab 11/30/24 0204  AST 19  ALT 15  ALKPHOS 64  BILITOT 0.7  PROT 6.7  ALBUMIN  2.5*   No results for input(s): LIPASE, AMYLASE in the last 168 hours.  No results for input(s): AMMONIA in the last 168 hours. Coagulation Profile: No results for input(s): INR, PROTIME in the last 168 hours. Cardiac Enzymes: No results for input(s): CKTOTAL, CKMB, CKMBINDEX, TROPONINI in the last 168 hours. BNP (last 3 results) No results for input(s): PROBNP in the last 8760 hours. HbA1C: No results for input(s): HGBA1C in the last 72 hours.  CBG: Recent Labs  Lab 12/04/24 2130 12/04/24 2140 12/04/24 2221  12/04/24 2252 12/05/24 0624  GLUCAP 49* 46* 47* 93 129*   Lipid Profile: No results for input(s): CHOL, HDL, LDLCALC, TRIG, CHOLHDL, LDLDIRECT in the last 72 hours. Thyroid  Function Tests: No results for input(s): TSH, T4TOTAL, FREET4, T3FREE, THYROIDAB in the last 72 hours. Anemia Panel: No results for input(s): VITAMINB12, FOLATE, FERRITIN, TIBC, IRON, RETICCTPCT in the last 72 hours. Urine analysis:    Component Value Date/Time   COLORURINE YELLOW 11/21/2024 1229   APPEARANCEUR CLEAR 11/21/2024 1229   LABSPEC 1.013 11/21/2024 1229   PHURINE 7.0 11/21/2024 1229   GLUCOSEU >=500 (A) 11/21/2024 1229   HGBUR NEGATIVE 11/21/2024 1229   BILIRUBINUR NEGATIVE 11/21/2024 1229   KETONESUR NEGATIVE 11/21/2024 1229   PROTEINUR 100 (A) 11/21/2024 1229   UROBILINOGEN 1.0 10/04/2009 1109   NITRITE NEGATIVE 11/21/2024 1229   LEUKOCYTESUR NEGATIVE 11/21/2024 1229   Sepsis Labs: @LABRCNTIP (procalcitonin:4,lacticidven:4)  ) No results found for this or any previous visit (from the past 240 hours).    Radiology Studies: No results found.    Scheduled Meds:  atorvastatin   40 mg Oral Daily   collagenase    Topical Daily   vitamin B-12  1,000 mcg Oral Daily   enoxaparin  (LOVENOX ) injection  30 mg Subcutaneous Q24H   ferrous sulfate   325 mg Oral Q breakfast   insulin  aspart  0-9 Units Subcutaneous TID WC   insulin  aspart  3 Units Subcutaneous TID WC   [START ON 12/06/2024] insulin  glargine  35 Units Subcutaneous Daily   pantoprazole   40 mg Oral Daily   sodium chloride  flush  3 mL Intravenous Q12H   Continuous Infusions:      LOS: 14 days    Time spent:    Sigurd Pac, MD [facility]   12/05/2024, 10:39 AM

## 2024-12-06 DIAGNOSIS — Z7189 Other specified counseling: Secondary | ICD-10-CM | POA: Diagnosis not present

## 2024-12-06 DIAGNOSIS — R1310 Dysphagia, unspecified: Secondary | ICD-10-CM | POA: Diagnosis not present

## 2024-12-06 DIAGNOSIS — J189 Pneumonia, unspecified organism: Secondary | ICD-10-CM | POA: Diagnosis not present

## 2024-12-06 DIAGNOSIS — Z515 Encounter for palliative care: Secondary | ICD-10-CM | POA: Diagnosis not present

## 2024-12-06 LAB — GLUCOSE, CAPILLARY
Glucose-Capillary: 72 mg/dL (ref 70–99)
Glucose-Capillary: 74 mg/dL (ref 70–99)
Glucose-Capillary: 125 mg/dL — ABNORMAL HIGH (ref 70–99)
Glucose-Capillary: 67 mg/dL — ABNORMAL LOW (ref 70–99)
Glucose-Capillary: 89 mg/dL (ref 70–99)
Glucose-Capillary: 103 mg/dL — ABNORMAL HIGH (ref 70–99)

## 2024-12-06 LAB — BASIC METABOLIC PANEL WITH GFR
Anion gap: 10 (ref 5–15)
BUN: 34 mg/dL — ABNORMAL HIGH (ref 8–23)
CO2: 24 mmol/L (ref 22–32)
Calcium: 8.8 mg/dL — ABNORMAL LOW (ref 8.9–10.3)
Chloride: 109 mmol/L (ref 98–111)
Creatinine, Ser: 1.25 mg/dL — ABNORMAL HIGH (ref 0.61–1.24)
GFR, Estimated: 57 mL/min — ABNORMAL LOW (ref >60)
Glucose, Bld: 67 mg/dL — ABNORMAL LOW (ref 70–99)
Potassium: 4.5 mmol/L (ref 3.5–5.1)
Sodium: 143 mmol/L (ref 135–145)

## 2024-12-06 MED ORDER — DEXTROSE-SODIUM CHLORIDE 5-0.45 % IV SOLN: INTRAVENOUS | Status: AC

## 2024-12-06 MED ORDER — ENSURE PLUS HIGH PROTEIN PO LIQD: 237.0000 mL | Freq: Two times a day (BID) | ORAL | Status: DC

## 2024-12-06 MED ORDER — INSULIN GLARGINE 100 UNIT/ML ~~LOC~~ SOLN
15.0000 [IU] | Freq: Every evening | SUBCUTANEOUS | Status: DC
  Filled 2024-12-06 (×2): qty 0.15

## 2024-12-06 NOTE — Progress Notes (Signed)
 PROGRESS NOTE    Andre Stout  FMW:[id number] DOB: [birth year] DOA: 11/21/2024 PCP: Pcp, No  84/M chronically ill with Dementia, Type 2 diabetes mellitus, hypertension, chronic anemia, complete heart block, prostate cancer, recently hospitalized 11/2-7 presented with failure to thrive, poor oral intake and diarrhea, noted to be in CHF exacerbation, AKI, hyponatremia and multifocal pneumonia, treated with antibiotics, diuresed, there was discussion on palliative care but not pursued.  Brought to the ED 11/22 with increased weakness and confusion, also history of worsening cough for few days, some dyspnea.  In the ED afebrile, vital stable, CBG over 300, WBC 14.8, lactate 4.9, BNP 1240, troponin 23, creatinine 1.6, chest x-ray noted new right upper lobe consolidation concerning for developing pneumonia, CT chest abdomen pelvis noted large focal areas of pulmonary consolidation right upper lobe and left lower lobe with progression of right upper lobe disease and moderate left and minimal right pleural effusions improved since prior study. - Admitted, started on antibiotics, SLP eval ongoing - 11/24, labs concerning for DKA, CBGs in the 800 range - Palliative consult, remains DNR, patient was reluctant to consider hospice - 11/26 recurrent respiratory distress, concern for aspiration again, repeat CXR w/ worsening Pneumonia - Significant failure to thrive, minimal p.o. intake, ongoing aspiration - 11/27, overnight with respiratory distress, per staff aspiration with all p.o.'s> called daughter and recommended comfort care -11/28: Continued failure to thrive, call daughter again, palliative reconsulted - 11/30-2/2 oral intake improving, then developed worsening hyperglycemia causing dehydration and hypernatremia, started on IV fluids, torsemide  held - 12/4-5, sodium still elevated, hyperglycemia, was restarted on Ensure - 12/6: Sodium finally normalized, still coughing fair bit with p.o. intake - 12/7,  hypoglycemic overnight, started on D5 W  Subjective: - Awake and interactive, eating breakfast  Assessment and Plan:  Severe sepsis secondary to pneumonia Dysphagia Patient presented with reports of increased weakness with reports of increasing cough over the last 2 days.  Found to be tachypneic with white blood cell count elevated at 14.8 and initial lactic acid 4.9.  CT chest abdomen pelvis revealed concern for large focal areas of pulmonary consolidation in the right upper lobe and left lower lobe with progression of the right upper lobe consolidation.  - Treated with IV Zosyn  initially and then switched to oral Augmentin , course completed - SLP following, MBS completed, noted to have oropharyngeal dysphagia,, now on dysphagia 1 diet -Pulmonary toilet, add DuoNebs, Mucinex  Has significant chronic disease burden, high risk of ongoing aspiration and quick rehospitalization, discussed with daughter recommended comfort focused care on multiple occasions, they are not ready for this palliative consulting, plan  for SNF with outpatient palliative care, oral intake better, respiratory status stable for 2 to 3 days now. - Plan for SNF on Monday, high risk of quick rehospitalization's  DKA Type 2 diabetes mellitus - Insulin  was held on admission as he was hypoglycemic recent admission, developed significant hyperglycemia and DKA earlier this admission was getting excessive amounts of Glucerna - Yesterday with some hypoglycemia after Ensure discontinued, glargine and meal coverage decreased again,  - Overnight with hypoglycemia, on 5 hours of D5 infusion which should be ending soon -Continue SSI for now, restart glargine at lower dose this evening  Hypernatremia - Improving with correction of hyperglycemia, Ensure discontinued - Encouraged increased water intake - D5W discontinued yesterday, then restarted for hypoglycemia  Dementia -memory and cognitive decline for 3years  Intermittent  complete heart block -Followed by cardiology as recent as few weeks ago, patient declined PPM, also felt  to be a poor candidate for invasive management   Chronic combined CHF - Last echo few weeks ago in 11/25 with a EF 35-40% with wall motion abnormality, mildly reduced RV  -EF noted to be depressed at 35% per last echocardiogram on 11/3.  Seen by cards, on account of numerous comorbidities no further invasive evaluation was recommended. - Had been on low-dose torsemide , now held for hyponatremia   Acute metabolic encephalopathy Cognitive impairment Patient noted to be acutely altered and unable to recognize family or location.  At baseline reportedly is able to recognize family and location although normally does not know the year.  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 -Baseline appears to be around 1.6, had worsening AKI earlier this admission, now improving   Microcytic anemia Stable     DVT prophylaxis: Lovenox  Code Status: DNR Family Communication: No family at bedside, discussed with daughter few days ago Disposition Plan: Plan for SNF on Monday if stable  Consultants:    Procedures:   Antimicrobials:    Objective: Vitals:   12/05/24 1938 12/06/24 0032 12/06/24 0423 12/06/24 0716  BP: 96/73 107/63 98/76 96/65   Pulse: 62 66 72 69  Resp: 18 20 18 18   Temp:  97.7 F (36.5 C) 97.6 F (36.4 C) (!) 97.4 F (36.3 C)  TempSrc:  Oral Oral Oral  SpO2: 100% 99% 99% 98%  Weight:   59.1 kg   Height:        Intake/Output Summary (Last 24 hours) at 12/06/2024 1023 Last data filed at 12/06/2024 0830 Gross per 24 hour  Intake 1273.19 ml  Output 1150 ml  Net 123.19 ml   Filed Weights   12/04/24 0420 12/05/24 0309 12/06/24 0423  Weight: 54.8 kg 58.2 kg 59.1 kg    Examination:  General exam:  Chronically ill, laying in bed, awake, pleasant, alert and oriented to self this morning, no distress Respiratory system: Few scattered rhonchi, decreased breath sounds at the bases Cardiovascular system: S1 & S2 heard, RRR.  Abd: nondistended, soft and nontender.Normal bowel sounds heard. Central nervous system: Awake alert, oriented to self only Extremities: no edema Skin: No rashes Psychiatry: Flat affect   Data Reviewed:   CBC: Recent Labs  Lab 11/30/24 0204 12/01/24 0248 12/02/24 0325 12/03/24 0317  WBC 9.1 11.4* 15.8* 14.3*  NEUTROABS 6.1  --   --   --   HGB 11.3* 11.4* 11.9* 11.7*  HCT 37.1* 37.0* 39.5 39.0  MCV 82.8 82.2 83.9 84.6  PLT 372 303 417* 400   Basic Metabolic Panel: Recent Labs  Lab 11/30/24 0204 12/01/24 0248 12/01/24 2107 12/02/24 0325 12/03/24 0317 12/03/24 1850 12/04/24 0245 12/04/24 1720 12/05/24 0245  NA 147* 149*   < > 151* 154* 149* 154* 151* 144  K 3.3* 4.3   < > 4.4 4.2  --  4.1 3.9 3.9  CL 108 109   < > 112* 116*  --  116* 118* 112*  CO2 33* 32   < > 29 30  --  23 24 22   GLUCOSE 312* 493*   < > 291* 122*  --  151* 59* 126*  BUN 59* 64*   < > 65* 55*  --  59* 54* 48*  CREATININE 1.69* 1.75*   < > 1.74* 1.57*  --  1.61* 1.55* 1.46*  CALCIUM  9.6 9.5   < >  9.7 9.6  --  9.5 8.8* 9.0  MG 2.0 1.9  --  2.0 2.2  --   --   --   --    < > = values in this interval not displayed.   GFR: Estimated Creatinine Clearance: 31.5 mL/min (A) (by C-G formula based on SCr of 1.46 mg/dL (H)). Liver Function Tests: Recent Labs  Lab 11/30/24 0204  AST 19  ALT 15  ALKPHOS 64  BILITOT 0.7  PROT 6.7  ALBUMIN  2.5*   No results for input(s): LIPASE, AMYLASE in the last 168 hours.  No results for input(s): AMMONIA in the last 168 hours. Coagulation Profile: No results for input(s): INR, PROTIME in the last 168 hours. Cardiac Enzymes: No results for input(s): CKTOTAL, CKMB, CKMBINDEX, TROPONINI in the last 168 hours. BNP (last 3  results) No results for input(s): PROBNP in the last 8760 hours. HbA1C: No results for input(s): HGBA1C in the last 72 hours.  CBG: Recent Labs  Lab 12/05/24 2120 12/05/24 2150 12/05/24 2244 12/06/24 0209 12/06/24 0544  GLUCAP 64* 73 99 72 74   Lipid Profile: No results for input(s): CHOL, HDL, LDLCALC, TRIG, CHOLHDL, LDLDIRECT in the last 72 hours. Thyroid  Function Tests: No results for input(s): TSH, T4TOTAL, FREET4, T3FREE, THYROIDAB in the last 72 hours. Anemia Panel: No results for input(s): VITAMINB12, FOLATE, FERRITIN, TIBC, IRON, RETICCTPCT in the last 72 hours. Urine analysis:    Component Value Date/Time   COLORURINE YELLOW 11/21/2024 1229   APPEARANCEUR CLEAR 11/21/2024 1229   LABSPEC 1.013 11/21/2024 1229   PHURINE 7.0 11/21/2024 1229   GLUCOSEU >=500 (A) 11/21/2024 1229   HGBUR NEGATIVE 11/21/2024 1229   BILIRUBINUR NEGATIVE 11/21/2024 1229   KETONESUR NEGATIVE 11/21/2024 1229   PROTEINUR 100 (A) 11/21/2024 1229   UROBILINOGEN 1.0 10/04/2009 1109   NITRITE NEGATIVE 11/21/2024 1229   LEUKOCYTESUR NEGATIVE 11/21/2024 1229   Sepsis Labs: @LABRCNTIP (procalcitonin:4,lacticidven:4)  ) No results found for this or any previous visit (from the past 240 hours).    Radiology Studies: No results found.    Scheduled Meds:  atorvastatin   40 mg Oral Daily   collagenase    Topical Daily   vitamin B-12  1,000 mcg Oral Daily   enoxaparin  (LOVENOX ) injection  30 mg Subcutaneous Q24H   ferrous sulfate   325 mg Oral Q breakfast   insulin  aspart  0-9 Units Subcutaneous TID WC   pantoprazole   40 mg Oral Daily   sodium chloride  flush  3 mL Intravenous Q12H   Continuous Infusions:      LOS: 15 days    Time spent:    Sigurd Pac, MD [facility]   12/06/2024, 10:23 AM

## 2024-12-06 NOTE — Progress Notes (Addendum)
   Palliative Medicine Inpatient Follow Up Note HPI: 84/M chronically ill with Dementia, Type 2 diabetes mellitus, hypertension, chronic anemia, complete heart block, prostate cancer, recently hospitalized  with failure to thrive, poor oral intake and diarrhea, noted to be in CHF exacerbation, AKI, hyponatremia and multifocal pneumonia, treated with antibiotics, diuresed, there was discussion on palliative care but not pursued.     Brought to the ED 11/22 with increased weakness and confusion, also history of worsening cough for few days, some dyspnea. CT chest  noted large focal areas of pulmonary consolidation right upper lobe and left lower lobe with progression of right upper lobe disease and moderate left and minimal right pleural effusions improved since prior study.  Palliative care is involved to support additional goals of care conversations.   Today's Discussion 12/06/2024  *Please note that this is a verbal dictation therefore any spelling or grammatical errors are due to the Dragon [employer] One system interpretation.  I reviewed the chart notes including nursing notes from [facility], progress notes from Dr. Fairy. I also reviewed vital signs, nursing flowsheets, medication administrations record, labs BMP: Na 143, K 4.5, Chl 109, CO2 24, Glu 67, BUN 34, Cr 1.25, Ca 8.8, Anion Gap 10, GFR 57, no recent imaging studies.    I met with Andre Stout at bedside this afternoon. He was pleasant and endorses no pain, nausea, or shortness of breath. He shares that he has had a desire to eat and drink.  I spoke with Andre Stout's RN, Andre Stout who endorses no concerns this afternoon other than his BS which was low this morning though has since improved.  Plan will be for rehabilitation once medically optimized, patients daughter is considering SNF's per TOC note.   Questions and concerns addressed/Palliative Support Provided.   Objective Assessment: Vital Signs Vitals:   12/06/24 0716 12/06/24  1148  BP: 96/65 111/60  Pulse: 69 77  Resp: 18 19  Temp: (!) 97.4 F (36.3 C) (!) 97.4 F (36.3 C)  SpO2: 98% 100%    Intake/Output Summary (Last 24 hours) at 12/06/2024 1232 Last data filed at 12/06/2024 0830 Gross per 24 hour  Intake 1273.19 ml  Output 1150 ml  Net 123.19 ml   Last Weight  Most recent update: 12/06/2024  4:23 AM    Weight  59.1 kg (130 lb 4.7 oz)            Gen:  Frail elderly Caucasian M chronically ill appearing HEENT: moist mucous membranes CV: Regular rate and rhythm  PULM:  On RA, breathing is even and nonlabored ABD: soft/nontender  EXT: No edema  Neuro: Alert and oriented x1  SUMMARY OF RECOMMENDATIONS   DNAR/DNI  E-MOST completed and in Vynca   Continue present care allowing time for outcomes   TOC working with patients daughter on SNF placement  OP Palliative care on discharge  The PMT will remain involved as needed ______________________________________________________________________________________ Andre Stout [place] Palliative Medicine Team Team Cell Phone: [phone] Please utilize secure chat with additional questions, if there is no response within 30 minutes please call the above phone number  MDM: Moderate  Palliative Medicine Team providers are available by phone from 7am to 7pm daily and can be reached through the team cell phone.  Should this patient require assistance outside of these hours, please call the patient's attending physician.

## 2024-12-06 NOTE — Plan of Care (Signed)
   Problem: Clinical Measurements: Goal: Will remain free from infection Outcome: Progressing Goal: Cardiovascular complication will be avoided Outcome: Progressing

## 2024-12-06 NOTE — Plan of Care (Signed)
   Problem: Health Behavior/Discharge Planning: Goal: Ability to manage health-related needs will improve Outcome: Progressing   Problem: Clinical Measurements: Goal: Ability to maintain clinical measurements within normal limits will improve Outcome: Progressing   Problem: Clinical Measurements: Goal: Will remain free from infection Outcome: Progressing

## 2024-12-07 ENCOUNTER — Other Ambulatory Visit (HOSPITAL_COMMUNITY): Payer: Self-pay

## 2024-12-07 LAB — GLUCOSE, CAPILLARY
Glucose-Capillary: 144 mg/dL — ABNORMAL HIGH (ref 70–99)
Glucose-Capillary: 166 mg/dL — ABNORMAL HIGH (ref 70–99)
Glucose-Capillary: 119 mg/dL — ABNORMAL HIGH (ref 70–99)
Glucose-Capillary: 238 mg/dL — ABNORMAL HIGH (ref 70–99)
Glucose-Capillary: 211 mg/dL — ABNORMAL HIGH (ref 70–99)
Glucose-Capillary: 90 mg/dL (ref 70–99)

## 2024-12-07 MED ORDER — LANTUS SOLOSTAR 100 UNIT/ML ~~LOC~~ SOPN: 15.0000 [IU] | PEN_INJECTOR | Freq: Every day | SUBCUTANEOUS | Status: DC

## 2024-12-07 MED ORDER — INSULIN GLARGINE 100 UNIT/ML ~~LOC~~ SOLN
15.0000 [IU] | Freq: Every day | SUBCUTANEOUS | Status: DC
  Administered 2024-12-07: 15 [IU] via SUBCUTANEOUS
  Filled 2024-12-07 (×2): qty 0.15

## 2024-12-07 MED ORDER — TORSEMIDE 20 MG PO TABS
20.0000 mg | ORAL_TABLET | ORAL | Status: DC
  Administered 2024-12-07: 20 mg via ORAL
  Filled 2024-12-07: qty 1

## 2024-12-07 MED ORDER — FUROSEMIDE 40 MG PO TABS
40.0000 mg | ORAL_TABLET | ORAL | 0 refills | Status: DC
  Filled 2024-12-07: qty 15, 30d supply, fill #0

## 2024-12-07 MED ORDER — TORSEMIDE 20 MG PO TABS: 20.0000 mg | ORAL_TABLET | ORAL | Status: AC

## 2024-12-07 NOTE — Progress Notes (Signed)
 Physical Therapy Treatment Patient Details Name: Andre Stout MRN: [id number] DOB: 1940/10/10 Today's Date: 12/07/2024   History of Present Illness The pt is an 84 yo male presenting 11/22 with weakness and BLE swelling. Admitted for sepsis due to PNA. PMH includes: dementia, AV block, HTN, T2DM, prostate CA, glaucoma, CHF, CVA, HLD    PT Comments  Pt tolerates treatment well, transferring to/from bedside commode with PT assistance. Pt continues to require physical assistance for transfers due to imbalance and weakness, often with a posterior lean. Pt remains at a high risk for falls due to weakness and poor endurance. Patient will benefit from continued inpatient follow up therapy, <3 hours/day.    If plan is discharge home, recommend the following: Two people to help with walking and/or transfers;Two people to help with bathing/dressing/bathroom;Assistance with cooking/housework;Assistance with feeding;Direct supervision/assist for medications management;Direct supervision/assist for financial management;Assist for transportation;Help with stairs or ramp for entrance;Supervision due to cognitive status   Can travel by private vehicle     No  Equipment Recommendations  Wheelchair (measurements PT);Wheelchair cushion (measurements PT);Hoyer lift    Recommendations for Other Services       Precautions / Restrictions Precautions Precautions: Fall Recall of Precautions/Restrictions: Impaired Restrictions Weight Bearing Restrictions Per Provider Order: No     Mobility  Bed Mobility Overal bed mobility: Needs Assistance Bed Mobility: Supine to Sit, Sit to Supine     Supine to sit: Min assist, HOB elevated Sit to supine: Mod assist        Transfers Overall transfer level: Needs assistance Equipment used: Rolling walker (2 wheels) Transfers: Sit to/from Stand, Bed to chair/wheelchair/BSC Sit to Stand: Mod assist   Step pivot transfers: Mod assist       General transfer  comment: pt requires assist to manage RW and to prevent posterior losses of balance during step-pivot transfers to and from commode. Pt often with a narrow BOS, further increasing instability    Ambulation/Gait                   Stairs             Wheelchair Mobility     Tilt Bed    Modified Rankin (Stroke Patients Only)       Balance Overall balance assessment: Needs assistance Sitting-balance support: Feet supported, Single extremity supported Sitting balance-Leahy Scale: Poor Sitting balance - Comments: anterior lean, standby for safety   Standing balance support: Bilateral upper extremity supported, Reliant on assistive device for balance Standing balance-Leahy Scale: Poor Standing balance comment: min-modA, posterior lean                            Communication Communication Communication: Impaired Factors Affecting Communication: Hearing impaired  Cognition Arousal: Alert Behavior During Therapy: Flat affect   PT - Cognitive impairments: Memory, Problem solving, History of cognitive impairments, Safety/Judgement, Sequencing                       PT - Cognition Comments: history of dementia in PMH, slowed processing Following commands: Impaired Following commands impaired: Follows one step commands with increased time    Cueing Cueing Techniques: Verbal cues, Visual cues, Tactile cues, Gestural cues  Exercises      General Comments General comments (skin integrity, edema, etc.): pt with bowel movement during session, totalA for pericare      Pertinent Vitals/Pain Pain Assessment Pain Assessment: No/denies pain    Home  Living                          Prior Function            PT Goals (current goals can now be found in the care plan section) Acute Rehab PT Goals Patient Stated Goal: none stated Progress towards PT goals: Progressing toward goals    Frequency    Min 2X/week      PT Plan       Co-evaluation              AM-PAC PT 6 Clicks Mobility   Outcome Measure  Help needed turning from your back to your side while in a flat bed without using bedrails?: A Lot Help needed moving from lying on your back to sitting on the side of a flat bed without using bedrails?: A Little Help needed moving to and from a bed to a chair (including a wheelchair)?: A Lot Help needed standing up from a chair using your arms (e.g., wheelchair or bedside chair)?: A Lot Help needed to walk in hospital room?: Total Help needed climbing 3-5 steps with a railing? : Total 6 Click Score: 11    End of Session Equipment Utilized During Treatment: Gait belt Activity Tolerance: Patient tolerated treatment well Patient left: in bed;with call bell/phone within reach;with bed alarm set Nurse Communication: Mobility status PT Visit Diagnosis: Other abnormalities of gait and mobility (R26.89);Difficulty in walking, not elsewhere classified (R26.2);Muscle weakness (generalized) (M62.81)     Time: 8767-8741 PT Time Calculation (min) (ACUTE ONLY): 26 min  Charges:    $Therapeutic Activity: 23-37 mins PT General Charges $$ ACUTE PT VISIT: 1 Visit                     Bernardino JINNY Ruth, PT, DPT Acute Rehabilitation Office [phone]    Bernardino JINNY Ruth 12/07/2024, 1:06 PM

## 2024-12-07 NOTE — Progress Notes (Signed)
 Speech Language Pathology Treatment: Dysphagia  Patient Details Name: Andre Stout MRN: [id number] DOB: [birth year] Today's Date: 12/07/2024 Time: 9076-9054 SLP Time Calculation (min) (ACUTE ONLY): 22 min  Assessment / Plan / Recommendation Clinical Impression  SLP conducted skilled therapy session targeting dysphagia goals. Patient reports no difficulty with meals since downgrade to NTLs however poor historian given cognitive status. SLP originally administered nectar thick liquids and purees noting timely swallow initiation and no overt s/sx of penetration/aspiration throughout. SLP then administered thin liquids via straw. Patient thoroughly challenged and again demonstrated swift swallow initiation and no overt difficulty. Recommend upgrade to Dys1/thin liquid diet given successful trials at bedside and recent MBS revealing no penetration/aspiration across all consistencies. Administer medications crushed in puree. Patient was left in room with call bell in reach and alarm set. SLP will continue to target goals per plan of care.      HPI HPI: 84 y.o. M adm 11/01/24 with AMS, diarrhea, bil pleural effusion, AKI, hyponatremia. PMH: AV block, HTN, T2DM, prostate CA, glaucoma, CHF, CVA, HLD. MBS completed with recs for Dys1/thin liquids, nursing noted coughing at bedside - downgraded to NTL to determine if better fit.      SLP Plan  Continue with current plan of care        Swallow Evaluation Recommendations   Recommendations: PO diet PO Diet Recommendation: Dysphagia 1 (Pureed);Thin liquids (Level 0) Liquid Administration via: Cup;Straw Medication Administration: Crushed with puree Supervision: Full assist for feeding Postural changes: Position pt fully upright for meals Oral care recommendations: Oral care BID (2x/day)     Recommendations                     Oral care BID;Staff/trained caregiver to provide oral care   Frequent or constant  Supervision/Assistance Dysphagia, oropharyngeal phase (R13.12)     Continue with current plan of care    Rosina Downy, M.A., CCC-SLP  Kasean Denherder A Debbera Wolken  12/07/2024, 12:37 PM

## 2024-12-07 NOTE — Discharge Summary (Addendum)
 Physician Discharge Summary  Andre Stout FMW:[id number] DOB: 05-Jul-1940 DOA: 11/21/2024  PCP: Freddrick, No  Admit date: 11/21/2024 Discharge date: 12/08/2024  Time spent: 45 minutes  Recommendations for Outpatient Follow-up:  SNF for short-term rehab Outpatient palliative care, continue goals of care discussions, prognosis is poor Brittle diabetic, avoid Ensure and other high-calorie supplements, developed severe hyperglycemia from this   Discharge Diagnoses:  Principal Problem:   Sepsis due to pneumonia (HCC)   Recurrent aspiration pneumonia   Dysphagia   Dementia   DKA   Intermittent complete Heart block   Pleural effusion due to CHF (congestive heart failure) (HCC)   Acute metabolic encephalopathy   Cognitive impairment   Hyponatremia   CKD (chronic kidney disease) stage 3, GFR 30-59 ml/min (HCC)   Microcytic anemia   Discharge Condition: fair, guarded prognosis  Diet recommendation: dysphagia 1 thin liquids, Carb modified  Filed Weights   12/07/24 1505 12/08/24 0424 12/08/24 0502  Weight: 60.2 kg 58.2 kg 57.4 kg    History of present illness:  84/M chronically ill with Dementia, Type 2 diabetes mellitus, hypertension, chronic anemia, complete heart block, prostate cancer, recently hospitalized 11/2-7 presented with failure to thrive, poor oral intake and diarrhea, noted to be in CHF exacerbation, AKI, hyponatremia and multifocal pneumonia, treated with antibiotics, diuresed, there was discussion on palliative care but not pursued.  Brought to the ED 11/22 with increased weakness and confusion, also history of worsening cough for few days, some dyspnea.  In the ED afebrile, vital stable, CBG over 300, WBC 14.8, lactate 4.9, BNP 1240, troponin 23, creatinine 1.6, chest x-ray noted new right upper lobe consolidation concerning for developing pneumonia, CT chest abdomen pelvis noted large focal areas of pulmonary consolidation right upper lobe and left lower lobe with  progression of right upper lobe disease and moderate left and minimal right pleural effusions improved since prior study. - Admitted, started on antibiotics, SLP eval ongoing - 11/24, labs concerning for DKA, CBGs in the 800 range - Palliative consult, remains DNR, patient was reluctant to consider hospice - 11/26 recurrent respiratory distress, concern for aspiration again, repeat CXR w/ worsening Pneumonia - Significant failure to thrive, minimal p.o. intake, ongoing aspiration - 11/27, overnight with respiratory distress, per staff aspiration with all p.o.'s> discussed poor prognosis with daughter  -11/28: Continued failure to thrive, called daughter again, palliative reconsulted - 11/30-2/2 oral intake improving, then developed worsening hyperglycemia causing dehydration and hypernatremia, started on IV fluids, torsemide  held - 12/4-5, sodium still elevated, hyperglycemia, was restarted on Ensure - 12/6: Sodium finally normalized, still coughing fair bit with p.o. intake   Hospital Course:   Severe sepsis secondary to pneumonia Dysphagia Patient presented with reports of increased weakness with reports of increasing cough over the last 2 days.  Found to be tachypneic with white blood cell count elevated at 14.8 and initial lactic acid 4.9.  CT chest abdomen pelvis revealed concern for large focal areas of pulmonary consolidation in the right upper lobe and left lower lobe with progression of the right upper lobe consolidation.  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 - Plan for SNF today per family request, high risk of quick rehospitalization   DKA Type 2 diabetes mellitus - Insulin  was held on admission as he was hypoglycemic recent admission, developed significant hyperglycemia and DKA earlier this admission was getting excessive amounts of Glucerna -Then developed severe hyperglycemia after Ensure -Became hypoglycemic on high-dose insulin  after Ensure was discontinued -Now CBGs finally stabilizing avoid supplements containing sugar and high fructose corn syrup, restarted on glargine   Hypernatremia - Improving with correction of hyperglycemia, Ensure discontinued - Encouraged increased water intake - Now improving, diuretic dose decreased   Dementia -memory and cognitive decline for 3years   Intermittent complete heart block -Followed by cardiology as recent as few weeks ago, patient declined PPM, also felt to be a poor candidate for invasive management   Chronic combined CHF - Last echo few weeks ago in 11/25 with a EF 35-40% with wall motion abnormality, mildly reduced RV  -EF noted to be depressed at 35% per last echocardiogram on 11/3.  Seen by cards, on account of numerous comorbidities no further invasive evaluation was recommended. - Had been on low-dose torsemide  then held for hyponatremia -now resumed every other day   Acute metabolic encephalopathy Cognitive impairment Patient noted to be acutely altered and unable to recognize family or location.  At baseline reportedly is able to recognize family and location although normally does not know the year.  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 -Baseline appears to be around 1.6, had worsening AKI earlier this admission, now  improving   Microcytic anemia Stable  Discharge Exam: Vitals:   12/08/24 0353 12/08/24 0709  BP: (!) 105/59 98/60  Pulse: 67 65  Resp: 18 18  Temp: 97.6 F (36.4 C) (!) 97.4 F (36.3 C)  SpO2: 100% 99%     Discharge Instructions    Allergies as of 12/08/2024   No Known Allergies      Medication List     STOP taking these medications    amLODipine  10 MG tablet Commonly known as: NORVASC    furosemide  40 MG tablet Commonly known as: Lasix        TAKE these medications    acetaminophen  500 MG tablet Commonly known as: TYLENOL  Take 500-1,000 mg by mouth daily as needed for mild pain (pain score 1-3) or moderate pain (pain score 4-6).   atorvastatin  40 MG tablet Commonly known as: LIPITOR Take 1 tablet (40 mg total) by mouth daily.   Azelastine  HCl 137 MCG/SPRAY Soln PLACE 1 SPRAY INTO BOTH NOSTRILS 2 (TWO) TIMES DAILY AS NEEDED. NEEDS APPT What changed:  when to take this reasons to take this additional instructions   cyanocobalamin  1000 MCG tablet Commonly known as: VITAMIN B12 Take 1 tablet (1,000 mcg total) by mouth daily.   FeroSul 325 (65 Fe) MG tablet Generic drug: ferrous sulfate  Take 1 tablet (325 mg total) by mouth daily with breakfast.   Lantus  SoloStar 100 UNIT/ML Solostar Pen Generic drug: insulin  glargine Inject 12 Units into the skin daily. What changed: how much to take   magnesium  oxide 400 (240 Mg) MG tablet Commonly known as: MAG-OX TAKE 1 TABLET BY MOUTH TWICE A DAY   metFORMIN  500 MG tablet Commonly known as: GLUCOPHAGE  Take 1 tablet (500 mg total) by mouth 2 (  two) times daily with a meal.   NovoLOG  FlexPen 100 UNIT/ML FlexPen Generic drug: insulin  aspart Inject under skin  5-7 units 10-15 min before meals 3-4 times a day What changed:  how much to take how to take this when to take this additional instructions   omeprazole  40 MG capsule Commonly known as: PRILOSEC Take 1 capsule (40 mg total) by mouth in the  morning.   potassium chloride  SA 20 MEQ tablet Commonly known as: KLOR-CON  M Take 1 tablet (20 mEq total) by mouth daily.   PreserVision AREDS 2 Caps Take 1 capsule by mouth daily.   torsemide  20 MG tablet Commonly known as: DEMADEX  Take 1 tablet (20 mg total) by mouth every other day.        No Known Allergies  Contact information for follow-up providers     Yacopino, Jessica L, NP Follow up.   Specialty: Nurse Practitioner Contact information: [address], [address] [place] [address] [phone]              Contact information for after-discharge care     Destination     [name]   Service: Skilled Nursing Contact information: [name] [place]  [address] [phone]                      The results of significant diagnostics from this hospitalization (including imaging, microbiology, ancillary and laboratory) are listed below for reference.    Significant Diagnostic Studies: DG CHEST PORT 1 VIEW Result Date: 11/26/2024 EXAM: 1 VIEW(S) XRAY OF THE CHEST 11/26/2024 07:16:00 AM COMPARISON: Portable chest 11/21/2024. CLINICAL HISTORY: 200808 Hypoxia 200808 Hypoxia 200808 FINDINGS: LUNGS AND PLEURA: Persisting patchy airspace disease in the right upper and left lower lung fields. In the left lower lung field, this is mildly improved. There is increased patchy haziness in the right lower lung field most likely due to a new area of pneumonia. The left upper lung field remains clear. Small pleural effusions, seen previously. No Pneumothorax. HEART AND MEDIASTINUM: The heart is enlarged. There is mild prominence in the central vessels, interval development of slight central interstitial edema. BONES AND SOFT TISSUES: Osteopenia and thoracic spondylosis. IMPRESSION: 1. Increased patchy haziness in the right lower lung field, likely representing a new area of pneumonia. 2. Persisting patchy airspace disease in the  right upper and left lower lung fields, with mild improvement in the left lower lung field. 3. Enlarged heart with mild prominence in the central vessels and interval development of slight central interstitial edema. 4. Small pleural effusions, seen previously. Electronically signed by: Francis Quam MD 11/26/2024 07:32 AM EST RP Workstation: [id number]   DG Swallowing Func-Speech Pathology Result Date: 11/24/2024 Table formatting from the original result was not included. Modified Barium Swallow Study Patient Details Name: ADAMA FERBER MRN: [id number] Date of Birth: October 22, 1940 Today's Date: 11/24/2024 HPI/PMH: HPI: Dhairya Corales is an 84 y/o, chronically ill male. Recently hospitalized 11/2-7 due to failure to thrive, poor oral intake and diarrhea, noted to be in CHF exacerbation, AKI, hyponatremia and multifocal pneumonia, treated with antibiotics, diuresed, there was discussion on palliative care but not pursued.  Brought to the ED 11/22 with increased weakness and confusion, also history of worsening cough for few days, some dyspnea.  Chest x-ray noted new right upper lobe consolidation concerning for developing pneumonia, CT chest abdomen pelvis noted large focal areas of pulmonary consolidation right upper lobe and left lower lobe with progression of  right upper lobe disease and moderate left and minimal right pleural effusions improved since prior study. Patient most recently seen by SLP 08/2024 during which he was placed on a dysphagia 1, thin liquid diet. Perviously seen in July 2025 with a cognitively based dysphagia.  PMH of  type 2 diabetes mellitus, dementia,hypertension, chronic anemia, complete heart block, prostate cancer. Clinical Impression Patient presents with a mild oropharyngeal phase dysphagia per this MBS. No aspiration or penetration was observed with any of the tested consistencies. Patient required minimal cues at times to hold attention to bolus. Oral impairments include escape of  bolus progressing to mid-chin, disorganized chewing/mashing, repetitive tongue motion, oral residue lining his tongue, and delayed swallow initiation to the valleculae. Pharyngeal impairments include partial anterior movement and collection of pharyngeal residue on valleculae. Patient's swallow function looks similar to his previous MBS on 09/03/24 which also revealed an oropharyngeal dysphagia. SLP recommending Dys 1 (puree) diet with thin liquids. SLP will continue to follow for diet toleration. Factors that may increase risk of adverse event in presence of aspiration [name] & [name] 2021):   DIGEST Swallow Severity Rating*             Safety:0                       Efficiency: 1             Overall Pharyngeal Swallow Severity: 1 (mild) 1: mild; 2: moderate; 3: severe; 4: profound *The Dynamic Imaging Grade of Swallowing Toxicity is standardized for the head and neck cancer population, however, demonstrates promising clinical applications across populations to standardize the clinical rating of pharyngeal swallow safety and severity. Recommendations/Plan: Swallowing Evaluation Recommendations Swallowing Evaluation Recommendations Recommendations: PO diet PO Diet Recommendation: Dysphagia 1 (Pureed); Thin liquids (Level 0) Liquid Administration via: Cup Medication Administration: Crushed with puree Swallowing strategies  : Slow rate; Small bites/sips; Minimize environmental distractions Postural changes: Position pt fully upright for meals; Stay upright 30-60 min after meals Oral care recommendations: Oral care BID (2x/day) Treatment Plan Treatment Plan Treatment recommendations: Therapy as outlined in treatment plan below Functional status assessment: Patient has had a recent decline in their functional status and demonstrates the ability to make significant improvements in function in a reasonable and predictable amount of time. Treatment frequency: Min 1x/week Treatment duration: 1 week Interventions:  Patient/family education; Trials of upgraded texture/liquids; Diet toleration management by SLP Recommendations Recommendations for follow up therapy are one component of a multi-disciplinary discharge planning process, led by the attending physician.  Recommendations may be updated based on patient status, additional functional criteria and insurance authorization. Assessment: Orofacial Exam: Orofacial Exam Oral Cavity: Oral Hygiene: WFL Oral Cavity - Dentition: Missing dentition Orofacial Anatomy: WFL Oral Motor/Sensory Function: WFL Anatomy: Anatomy: WFL Boluses Administered: Boluses Administered Boluses Administered: Thin liquids (Level 0); Mildly thick liquids (Level 2, nectar thick); Moderately thick liquids (Level 3, honey thick); Puree; Solid  Oral Impairment Domain: Oral Impairment Domain Lip Closure: Escape progressing to mid-chin Tongue control during bolus hold: Not tested Bolus preparation/mastication: Disorganized chewing/mashing with solid pieces of bolus unchewed Bolus transport/lingual motion: Repetitive/disorganized tongue motion Oral residue: Residue collection on oral structures Location of oral residue : Tongue Initiation of pharyngeal swallow : Valleculae  Pharyngeal Impairment Domain: Pharyngeal Impairment Domain Soft palate elevation: No bolus between soft palate (SP)/pharyngeal wall (PW) Laryngeal elevation: Complete superior movement of thyroid  cartilage with complete approximation of arytenoids to epiglottic petiole Anterior hyoid excursion: Partial anterior movement Epiglottic movement:  Complete inversion Laryngeal vestibule closure: Complete, no air/contrast in laryngeal vestibule Pharyngeal stripping wave : Present - complete Pharyngeal contraction (A/P view only): N/A Pharyngoesophageal segment opening: Complete distension and complete duration, no obstruction of flow Tongue base retraction: No contrast between tongue base and posterior pharyngeal wall (PPW) Pharyngeal residue:  Collection of residue within or on pharyngeal structures Location of pharyngeal residue: Valleculae  Esophageal Impairment Domain: Esophageal Impairment Domain Esophageal clearance upright position: Complete clearance, esophageal coating Pill: No data recorded Penetration/Aspiration Scale Score: Penetration/Aspiration Scale Score 1.  Material does not enter airway: Thin liquids (Level 0); Mildly thick liquids (Level 2, nectar thick); Moderately thick liquids (Level 3, honey thick); Puree; Solid Compensatory Strategies: Compensatory Strategies Compensatory strategies: No   General Information: Caregiver present: No  Diet Prior to this Study: Regular; Thin liquids (Level 0)   Temperature : Normal   Respiratory Status: WFL   Supplemental O2: None (Room air)   History of Recent Intubation: No  Behavior/Cognition: Alert; Cooperative; Lethargic/Drowsy; Requires cueing Self-Feeding Abilities: Needs assist with self-feeding No data recorded No data recorded Volitional Swallow: Able to elicit No data recorded Goal Planning: Prognosis for improved oropharyngeal function: Fair Barriers to Reach Goals: Cognitive deficits No data recorded No data recorded Consulted and agree with results and recommendations: Pt unable/family or caregiver not available; Nurse Pain: Pain Assessment Pain Assessment: No/denies pain End of Session: Start Time:SLP Start Time (ACUTE ONLY): 1308 Stop Time: SLP Stop Time (ACUTE ONLY): 1319 Time Calculation:SLP Time Calculation (min) (ACUTE ONLY): 11 min Charges: SLP Evaluations $ SLP Speech Visit: 1 Visit SLP Evaluations $MBS Swallow: 1 Procedure SLP visit diagnosis: No data recorded Past Medical History: Past Medical History: Diagnosis Date  CKD (chronic kidney disease) stage 3, GFR 30-59 ml/min (HCC)   Diabetes mellitus without complication (HCC)   Glaucoma   Heart block   complete heart block  History of CVA (cerebrovascular accident)   Hypertension   Prostate cancer (HCC)   Been 3-4 years ago Past  Surgical History: Past Surgical History: Procedure Laterality Date  CATARACT EXTRACTION Bilateral   INSERTION PROSTATE RADIATION SEED    IR EXCHANGE BILIARY DRAIN  08/19/2023  IR EXCHANGE BILIARY DRAIN  12/19/2023  IR EXCHANGE BILIARY DRAIN  01/20/2024  IR PERC CHOLECYSTOSTOMY  08/17/2022  IR RADIOLOGIST EVAL & MGMT  09/28/2022  IR RADIOLOGIST EVAL & MGMT  10/10/2022 Norleen IVAR Blase, MA, CCC-SLP Speech Therapy   VAS US  LOWER EXTREMITY VENOUS (DVT) (7a-7p) Result Date: 11/23/2024  Lower Venous DVT Study Patient Name:  VICKIE PONDS  Date of Exam:   11/23/2024 Medical Rec #: [id number]       Accession #:    [id number] Date of Birth: March 24, 1940      Patient Gender: M Patient Age:   28 years Exam Location:  [facility] Procedure:      VAS US  LOWER EXTREMITY VENOUS (DVT) Referring Phys: MAUDE MESSICK --------------------------------------------------------------------------------  Indications: Edema.  Risk Factors: None identified. Limitations: Poor ultrasound/tissue interface and patient positioning, patient immobility, patient leg contracture. Comparison Study: No prior studies. Performing Technologist: Cordella Collet RVT  Examination Guidelines: A complete evaluation includes B-mode imaging, spectral Doppler, color Doppler, and power Doppler as needed of all accessible portions of each vessel. Bilateral testing is considered an integral part of a complete examination. Limited examinations for reoccurring indications may be performed as noted. The reflux portion of the exam is performed with the patient in reverse Trendelenburg.  +---------+---------------+---------+-----------+----------+--------------+ RIGHT    CompressibilityPhasicitySpontaneityPropertiesThrombus Aging +---------+---------------+---------+-----------+----------+--------------+ CFV  Full           Yes      Yes                                 +---------+---------------+---------+-----------+----------+--------------+ SFJ       Full                                                        +---------+---------------+---------+-----------+----------+--------------+ FV Prox  Full                                                        +---------+---------------+---------+-----------+----------+--------------+ FV Mid   Full           Yes      Yes                                 +---------+---------------+---------+-----------+----------+--------------+ FV Distal               Yes      Yes                                 +---------+---------------+---------+-----------+----------+--------------+ PFV      Full                                                        +---------+---------------+---------+-----------+----------+--------------+ POP                     Yes      Yes                                 +---------+---------------+---------+-----------+----------+--------------+ PTV      Full                                                        +---------+---------------+---------+-----------+----------+--------------+ PERO     Full                                                        +---------+---------------+---------+-----------+----------+--------------+   +---------+---------------+---------+-----------+----------+--------------+ LEFT     CompressibilityPhasicitySpontaneityPropertiesThrombus Aging +---------+---------------+---------+-----------+----------+--------------+ CFV      Full           Yes      Yes                                 +---------+---------------+---------+-----------+----------+--------------+  SFJ      Full                                                        +---------+---------------+---------+-----------+----------+--------------+ FV Prox  Full                                                        +---------+---------------+---------+-----------+----------+--------------+ FV Mid   Full                                                         +---------+---------------+---------+-----------+----------+--------------+ FV DistalFull                                                        +---------+---------------+---------+-----------+----------+--------------+ PFV      Full                                                        +---------+---------------+---------+-----------+----------+--------------+ POP      Full           Yes      Yes                                 +---------+---------------+---------+-----------+----------+--------------+ PTV      Full                                                        +---------+---------------+---------+-----------+----------+--------------+ PERO     Full                                                        +---------+---------------+---------+-----------+----------+--------------+     Summary: RIGHT: - There is no evidence of deep vein thrombosis in the lower extremity. However, portions of this examination were limited- see technologist comments above.  - No cystic structure found in the popliteal fossa.  LEFT: - There is no evidence of deep vein thrombosis in the lower extremity. However, portions of this examination were limited- see technologist comments above.  - No cystic structure found in the popliteal fossa.  *See table(s) above for measurements and observations. Electronically signed by Lonni Gaskins MD on 11/23/2024 at 10:39:59 AM.    Final    DG Chest Port 1 View Result Date: 11/21/2024 EXAM: 1  VIEW(S) XRAY OF THE CHEST 11/21/2024 01:17:00 PM COMPARISON: 11/05/2024 CLINICAL HISTORY: sob sob FINDINGS: LUNGS AND PLEURA: Shallow inspiration. There is a new rounded consolidation demonstrated in the right upper lobe. Given the short time frame of involvement, this is likely a developing area of pneumonia. Follow-up to resolution is recommended. No pleural effusion or pneumothorax. HEART AND MEDIASTINUM: Heart size and pulmonary  vascularity are normal. Calcification of the aorta. BONES AND SOFT TISSUES: Degenerative changes in the spine and shoulders. IMPRESSION: 1. New rounded right upper lobe consolidation, most consistent with developing pneumonia. Recommend imaging follow-up to ensure resolution. 2. No pleural effusion or pneumothorax. Electronically signed by: Elsie Gravely MD 11/21/2024 01:27 PM EST RP Workstation: [id number]   CT Head Wo Contrast Result Date: 11/21/2024 EXAM: CT HEAD WITHOUT CONTRAST 11/21/2024 12:57:43 PM TECHNIQUE: CT of the head was performed without the administration of intravenous contrast. Automated exposure control, iterative reconstruction, and/or weight based adjustment of the mA/kV was utilized to reduce the radiation dose to as low as reasonably achievable. COMPARISON: 11/01/2024 CLINICAL HISTORY: Mental status change, unknown cause. FINDINGS: BRAIN AND VENTRICLES: No acute hemorrhage. No evidence of acute infarct. Proportional prominence of ventricles and sulci, consistent with diffuse cerebral parenchymal volume loss. Scattered periventricular white matter hypoattenuation, consistent with mild chronic ischemic microvascular disease. Chronic right cerebellar infarct. No hydrocephalus. No extra-axial collection. No mass effect or midline shift. ORBITS: Bilateral lens replacement. SINUSES: Partial left mastoid air cell effusion. SOFT TISSUES AND SKULL: No acute soft tissue abnormality. No skull fracture. Calcified atherosclerotic plaque in cavernous/supraclinoid ICA and intradural vertebral arteries. IMPRESSION: 1. No acute intracranial abnormality. 2. Stable diffuse cerebral parenchymal volume loss with mild chronic ischemic microvascular disease. 3. Chronic right cerebellar infarct. Electronically signed by: Lonni Necessary MD 11/21/2024 01:10 PM EST RP Workstation: [id number]   CT CHEST ABDOMEN PELVIS WO CONTRAST Result Date: 11/21/2024 EXAM: CT CHEST, ABDOMEN AND PELVIS WITHOUT CONTRAST  11/21/2024 12:57:43 PM TECHNIQUE: CT of the chest, abdomen and pelvis was performed without the administration of intravenous contrast. Multiplanar reformatted images are provided for review. Automated exposure control, iterative reconstruction, and/or weight based adjustment of the mA/kV was utilized to reduce the radiation dose to as low as reasonably achievable. Examination is technically limited due to nonstandard patient positioning. Motion artifact limits the examination. COMPARISON: 11/01/2024 CLINICAL HISTORY: Sepsis. FINDINGS: CHEST: MEDIASTINUM AND LYMPH NODES: Heart and pericardium are unremarkable. The central airways are clear. No mediastinal, hilar or axillary lymphadenopathy. LUNGS AND PLEURA: Large focal areas of pulmonary consolidation are demonstrated in the right upper lobe and in the left lower lobe. Right upper lobe consolidation is progressing since the previous study. Basilar congestion is improved. Moderate left and minimal right pleural effusions are also improved since the prior study. No pneumothorax. ABDOMEN AND PELVIS: LIVER: Unenhanced appearance of the liver is normal. GALLBLADDER AND BILE DUCTS: Unenhanced appearance of the gallbladder is normal. No biliary ductal dilatation. SPLEEN: Unenhanced appearance of the spleen is normal. PANCREAS: Unenhanced appearance of the pancreas is normal. ADRENAL GLANDS: Unenhanced appearance of the adrenal glands is normal. KIDNEYS, URETERS AND BLADDER: Large right renal cyst measuring 10.4 cm diameter. 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 No change since prior study. This is likely benign and no imaging follow-up is indicated. No stones in the kidneys or ureters. No hydronephrosis. No perinephric or periureteral stranding. The bladder is decompressed, but the bladder wall appears diffusely thickened, possibly due to outlet obstruction or cystitis. Correlate with urinalysis. GI AND BOWEL: Stomach demonstrates no  acute  abnormality. There is no bowel obstruction. REPRODUCTIVE ORGANS: Prostate seed implants. PERITONEUM AND RETROPERITONEUM: No ascites. No free air. VASCULATURE: Aorta is normal in caliber. Calcification of the aorta. No aneurysm. ABDOMINAL AND PELVIS LYMPH NODES: No significant lymphadenopathy. BONES AND SOFT TISSUES: Degenerative changes in the spine. Compression of the L1 vertebra with vertebra plana. No change. No focal bone lesions. No acute osseous abnormality. No focal soft tissue abnormality. IMPRESSION: 1. Large focal areas of pulmonary consolidation in the right upper lobe and left lower lobe, with progression of the right upper lobe consolidation since the previous study. Basilar congestion is improved. Changes could represent pneumonia or aspiration. 2. Moderate left and minimal right pleural effusions, improved since the prior study. 3. Bladder wall thickening, possibly due to outlet obstruction or cystitis. Recommend correlation with urinalysis. 4. 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 Stable and no imaging follow-up is indicated. Electronically signed by: Elsie Gravely MD 11/21/2024 01:05 PM EST RP Workstation: [id number]    Microbiology: No results found for this or any previous visit (from the past 240 hours).   Labs: Basic Metabolic Panel: Recent Labs  Lab 12/02/24 0325 12/03/24 0317 12/03/24 1850 12/04/24 0245 12/04/24 1720 12/05/24 0245 12/06/24 0845  NA 151* 154* 149* 154* 151* 144 143  K 4.4 4.2  --  4.1 3.9 3.9 4.5  CL 112* 116*  --  116* 118* 112* 109  CO2 29 30  --  23 24 22 24   GLUCOSE 291* 122*  --  151* 59* 126* 67*  BUN 65* 55*  --  59* 54* 48* 34*  CREATININE 1.74* 1.57*  --  1.61* 1.55* 1.46* 1.25*  CALCIUM  9.7 9.6  --  9.5 8.8* 9.0 8.8*  MG 2.0 2.2  --   --   --   --   --    Liver Function Tests: No results for input(s): AST, ALT, ALKPHOS, BILITOT, PROT, ALBUMIN  in the last  168 hours. No results for input(s): LIPASE, AMYLASE in the last 168 hours. No results for input(s): AMMONIA in the last 168 hours. CBC: Recent Labs  Lab 12/02/24 0325 12/03/24 0317  WBC 15.8* 14.3*  HGB 11.9* 11.7*  HCT 39.5 39.0  MCV 83.9 84.6  PLT 417* 400   Cardiac Enzymes: No results for input(s): CKTOTAL, CKMB, CKMBINDEX, TROPONINI in the last 168 hours. BNP: BNP (last 3 results) Recent Labs    09/02/24 0257 11/01/24 1830 11/21/24 1229  BNP 1,092.1* 2,276.0* 1,240.6*    ProBNP (last 3 results) No results for input(s): PROBNP in the last 8760 hours.  CBG: Recent Labs  Lab 12/07/24 1619 12/07/24 2114 12/08/24 0614 12/08/24 0642 12/08/24 0702  GLUCAP 211* 90 45* 69* 77       Signed:  Sigurd Pac MD.  [facility] 12/08/2024, 8:05 AM

## 2024-12-07 NOTE — TOC Progression Note (Addendum)
 Transition of Care [facility]) - Progression Note    Patient Details  Name: Andre Stout MRN: [id number] Date of Birth: January 27, 1940  Transition of Care [facility]) CM/SW Contact  [name], [place] Phone Number: 12/07/2024, 11:00 AM  Clinical Narrative:   Per MD, patient CSW spoke with Michaelle Amble about SNF decision. Michaelle stated she is leaning towards [facility]. Michaelle gave CSW permission to start insurance auth process for [facility]. CSW notified facility of patients/family's acceptance. Per facility they have bed availability today. CSW to start auth once updated PT note is in.   1:25 PM CSW submitted for auth - id [id number]. Prior insurance auth now pending.   CSW will continue to follow.     Expected Discharge Plan: Skilled Nursing Facility Barriers to Discharge: Continued Medical Work up, SNF Pending bed offer, English As A Second Language Teacher               Expected Discharge Plan and Services In-house Referral: Clinical Social Work   Post Acute Care Choice: Skilled Nursing Facility Living arrangements for the past 2 months: Single Family Home                 DME Arranged: N/A DME Agency: NA       HH Arranged: NA HH Agency: NA         Social Drivers of Health (SDOH) Interventions SDOH Screenings   Food Insecurity: No Food Insecurity (11/03/2024)  Housing: Low Risk  (11/03/2024)  Transportation Needs: Patient Unable To Answer (11/03/2024)  Utilities: Patient Unable To Answer (11/03/2024)  Alcohol Screen: Low Risk  (07/24/2023)  Depression (PHQ2-9): Low Risk  (07/24/2023)  Financial Resource Strain: Low Risk  (10/26/2024)  Physical Activity: Inactive (10/26/2024)  Social Connections: Unknown (11/03/2024)  Recent Concern: Social Connections - Socially Isolated (10/26/2024)  Stress: No Stress Concern Present (10/26/2024)  Tobacco Use: Medium Risk (11/21/2024)  Health Literacy: Adequate Health Literacy (07/24/2023)    Readmission Risk Interventions    11/06/2024    10:49 AM 08/15/2022    9:25 AM  Readmission Risk Prevention Plan  Transportation Screening Complete Complete  PCP or Specialist Appt within 5-7 Days  Complete  Home Care Screening  Complete  Medication Review (RN CM)  Complete  Medication Review (RN Care Manager) Complete   PCP or Specialist appointment within 3-5 days of discharge Complete   HRI or Home Care Consult Complete   Palliative Care Screening Not Applicable   Skilled Nursing Facility Not Applicable

## 2024-12-07 NOTE — Care Management Important Message (Signed)
 Important Message  Patient Details  Name: Andre Stout MRN: [id number] Date of Birth: 03-04-1940   Important Message Given:  Yes - Medicare IM     [name] 12/07/2024, 10:24 AM

## 2024-12-07 NOTE — Consult Note (Signed)
 WOC Nurse wound follow up Wound type: Unstageable Pressure Injury Measurement: 9cm x 3cm x 0.1cm  Wound bed: 95% yellow/5% pink circumferentially  Drainage (amount, consistency, odor) minimal; serosanguinous  Periwound: intact  Dressing procedure/placement/frequency: Continue enzymatic debridement to the sacral wound; top with saline moist gauze; and foam Apply daily Turn and reposition per hospital policy Add LALM for moisture and pressure redistribution  WOC Nurse team will follow with you and see patient within 10 days for wound assessments.  Please notify WOC nurses of any acute changes in the wounds or any new areas of concern Sela Falk [facility] MSN, RN,CWOCN, CNS, CWON-AP [phone]

## 2024-12-07 NOTE — Plan of Care (Signed)

## 2024-12-07 NOTE — Plan of Care (Signed)
 ?  Problem: Clinical Measurements: ?Goal: Will remain free from infection ?Outcome: Progressing ?  ?

## 2024-12-08 LAB — GLUCOSE, CAPILLARY
Glucose-Capillary: 45 mg/dL — ABNORMAL LOW (ref 70–99)
Glucose-Capillary: 69 mg/dL — ABNORMAL LOW (ref 70–99)
Glucose-Capillary: 77 mg/dL (ref 70–99)
Glucose-Capillary: 268 mg/dL — ABNORMAL HIGH (ref 70–99)

## 2024-12-08 MED ORDER — INSULIN GLARGINE 100 UNIT/ML ~~LOC~~ SOLN
10.0000 [IU] | Freq: Every day | SUBCUTANEOUS | Status: DC
  Administered 2024-12-08: 10 [IU] via SUBCUTANEOUS
  Filled 2024-12-08: qty 0.1

## 2024-12-08 MED ORDER — LANTUS SOLOSTAR 100 UNIT/ML ~~LOC~~ SOPN: 12.0000 [IU] | PEN_INJECTOR | Freq: Every day | SUBCUTANEOUS | Status: DC

## 2024-12-08 NOTE — TOC Transition Note (Signed)
 Transition of Care [facility]) - Discharge Note   Patient Details  Name: Andre Stout MRN: [id number] Date of Birth: [birth year]  Transition of Care [facility]) CM/SW Contact:  [name], LCSWA Phone Number: 12/08/2024, 1:59 PM   Clinical Narrative:   [facility] will [name] Anticipated DC date: 12/08/24  Family notified: Dorlene Michaelle Amble (Daughter) [phone]  Transport by: [facility]   Per MD patient ready for DC to [name]. RN to call report prior to discharge [phone]; room 806 [facility]). RN, patient, patient's family, and facility notified of DC. Discharge Summary and FL2 sent to facility. DC packet on chart. Ambulance transport requested for patient 1:57 PM.   CSW will sign off for now as social work intervention is no longer needed. Please consult us  again if new needs arise.      Final next level of care: Skilled Nursing Facility Barriers to Discharge: Barriers Resolved   Patient Goals and CMS Choice Patient states their goals for this hospitalization and ongoing recovery are:: To go to rehab CMS Medicare.gov Compare Post Acute Care list provided to:: Other (Comment Required) (Family) Choice offered to / list presented to : Adult Children [place] ownership interest in [facility].provided to:: Adult Children    Discharge Placement              [facility] at[name] Patient to be transferred to facility by: [facility] Name of family member notified: Dorlene Michaelle Amble (Daughter)  [phone] Patient and family notified of of transfer: 12/08/24  Discharge Plan and Services Additional resources added to the After Visit Summary for   In-house Referral: Clinical Social Work   Post Acute Care Choice: Skilled Nursing Facility          DME Arranged: N/A DME Agency: NA       HH Arranged: NA HH Agency: NA Date HH Agency Contacted: 11/23/24      Social Drivers of Health (SDOH) Interventions SDOH Screenings   Food Insecurity:  No Food Insecurity (11/03/2024)  Housing: Low Risk  (11/03/2024)  Transportation Needs: Patient Unable To Answer (11/03/2024)  Utilities: Patient Unable To Answer (11/03/2024)  Alcohol Screen: Low Risk  (07/24/2023)  Depression (PHQ2-9): Low Risk  (07/24/2023)  Financial Resource Strain: Low Risk  (10/26/2024)  Physical Activity: Inactive (10/26/2024)  Social Connections: Unknown (11/03/2024)  Recent Concern: Social Connections - Socially Isolated (10/26/2024)  Stress: No Stress Concern Present (10/26/2024)  Tobacco Use: Medium Risk (11/21/2024)  Health Literacy: Adequate Health Literacy (07/24/2023)     Readmission Risk Interventions    11/06/2024   10:49 AM 08/15/2022    9:25 AM  Readmission Risk Prevention Plan  Transportation Screening Complete Complete  PCP or Specialist Appt within 5-7 Days  Complete  Home Care Screening  Complete  Medication Review (RN CM)  Complete  Medication Review (RN Care Manager) Complete   PCP or Specialist appointment within 3-5 days of discharge Complete   HRI or Home Care Consult Complete   Palliative Care Screening Not Applicable   Skilled Nursing Facility Not Applicable

## 2024-12-08 NOTE — Plan of Care (Signed)

## 2024-12-08 NOTE — Progress Notes (Signed)
 Speech Language Pathology Treatment: Dysphagia  Patient Details Name: Andre Stout MRN: [id number] DOB: [birth year] Today's Date: 12/08/2024 Time: 9094-9082 SLP Time Calculation (min) (ACUTE ONLY): 12 min  Assessment / Plan / Recommendation Clinical Impression  Followed up after liquids advanced to thin yesterday and upgraded po trial. There were no s/s aspiration with multiple trials thin via straw throughout session. Pt states he does not eat without dentures and currently on puree. Dentures donned and trialed with [name] cracker trials. He exhibited timely mastication without residue as previously noted on MBS despite dentures falling from hard palate slightly. Recommend continue thin and upgrade to Dys 3 with dentures placed. Denture cream ordered, arrived and SLP replaced dentures using cream which facilitated adequate fit. Educated Personnel Officer re: texture upgrade and placement of dentures prior to all meals using denture cream for top plate. ST will continue to see for efficiency with upgraded texture.    HPI HPI: 84 y.o. M adm 11/01/24 with AMS, diarrhea, bil pleural effusion, AKI, hyponatremia. PMH: AV block, HTN, T2DM, prostate CA, glaucoma, CHF, CVA, HLD. MBS completed with recs for Dys1/thin liquids, nursing noted coughing at bedside - downgraded to NTL to determine if better fit.      SLP Plan  Continue with current plan of care        Swallow Evaluation Recommendations   Recommendations: PO diet PO Diet Recommendation: Dysphagia 3 (Mechanical soft);Thin liquids (Level 0) Liquid Administration via: Cup;Straw Medication Administration: Whole meds with puree Supervision: Full assist for feeding Postural changes: Position pt fully upright for meals Oral care recommendations: Oral care BID (2x/day)     Recommendations                     Oral care BID;Staff/trained caregiver to provide oral care   Frequent or constant Supervision/Assistance Dysphagia,  oropharyngeal phase (R13.12)     Continue with current plan of care     Dustin Olam Bull  12/08/2024, 9:33 AM

## 2024-12-08 NOTE — Progress Notes (Signed)
 No changes from my discharge summary yesterday, plan for SNF once insurance authorization obtained  Sigurd Pac, MD

## 2024-12-08 NOTE — TOC Progression Note (Addendum)
 Transition of Care [facility]) - Progression Note    Patient Details  Name: Andre Stout MRN: [id number] Date of Birth: Dec 29, 1940  Transition of Care [facility]) CM/SW Contact  [name], [place] Phone Number: 12/08/2024, 8:25 AM  Clinical Narrative:   Insurance auth still pending.  1:28 PM Auth approved 12/07/2024-12/10/2024. [name] can accept patient today.   CSW spoke with Michaelle Amble about insurance auth approval and patient discharging today. Michaelle is in agreement. CSW asked if family/patient would like Palliative services to follow patient at SNF. Billie declined.   CSW will continue to follow.    Expected Discharge Plan: Skilled Nursing Facility Barriers to Discharge: Continued Medical Work up,  English As A Second Language Teacher               Expected Discharge Plan and Services In-house Referral: Clinical Social Work   Post Acute Care Choice: Skilled Nursing Facility Living arrangements for the past 2 months: Single Family Home Expected Discharge Date: 12/07/24               DME Arranged: N/A DME Agency: NA       HH Arranged: NA HH Agency: NA         Social Drivers of Health (SDOH) Interventions SDOH Screenings   Food Insecurity: No Food Insecurity (11/03/2024)  Housing: Low Risk  (11/03/2024)  Transportation Needs: Patient Unable To Answer (11/03/2024)  Utilities: Patient Unable To Answer (11/03/2024)  Alcohol Screen: Low Risk  (07/24/2023)  Depression (PHQ2-9): Low Risk  (07/24/2023)  Financial Resource Strain: Low Risk  (10/26/2024)  Physical Activity: Inactive (10/26/2024)  Social Connections: Unknown (11/03/2024)  Recent Concern: Social Connections - Socially Isolated (10/26/2024)  Stress: No Stress Concern Present (10/26/2024)  Tobacco Use: Medium Risk (11/21/2024)  Health Literacy: Adequate Health Literacy (07/24/2023)    Readmission Risk Interventions    11/06/2024   10:49 AM 08/15/2022    9:25 AM  Readmission Risk Prevention Plan   Transportation Screening Complete Complete  PCP or Specialist Appt within 5-7 Days  Complete  Home Care Screening  Complete  Medication Review (RN CM)  Complete  Medication Review (RN Care Manager) Complete   PCP or Specialist appointment within 3-5 days of discharge Complete   HRI or Home Care Consult Complete   Palliative Care Screening Not Applicable   Skilled Nursing Facility Not Applicable

## 2025-01-01 ENCOUNTER — Encounter (HOSPITAL_COMMUNITY): Payer: Self-pay

## 2025-01-01 ENCOUNTER — Emergency Department (HOSPITAL_COMMUNITY)
Admission: EM | Admit: 2025-01-01 | Discharge: 2025-01-01 | Disposition: A | Discharge status: Home / Self Care | Source: Home / Self Care

## 2025-01-01 ENCOUNTER — Other Ambulatory Visit: Payer: Self-pay

## 2025-01-01 DIAGNOSIS — E875 Hyperkalemia: Secondary | ICD-10-CM | POA: Insufficient documentation

## 2025-01-01 DIAGNOSIS — E162 Hypoglycemia, unspecified: Secondary | ICD-10-CM | POA: Insufficient documentation

## 2025-01-01 DIAGNOSIS — Z794 Long term (current) use of insulin: Secondary | ICD-10-CM | POA: Insufficient documentation

## 2025-01-01 DIAGNOSIS — Z7984 Long term (current) use of oral hypoglycemic drugs: Secondary | ICD-10-CM | POA: Insufficient documentation

## 2025-01-01 LAB — CBC WITH DIFFERENTIAL/PLATELET
Abs Immature Granulocytes: 0.09 K/uL — ABNORMAL HIGH (ref 0.00–0.07)
Basophils Absolute: 0.1 K/uL (ref 0.0–0.1)
Basophils Relative: 0 %
Eosinophils Absolute: 0.1 K/uL (ref 0.0–0.5)
Eosinophils Relative: 0 %
HCT: 38.4 % — ABNORMAL LOW (ref 39.0–52.0)
Hemoglobin: 12.3 g/dL — ABNORMAL LOW (ref 13.0–17.0)
Immature Granulocytes: 1 %
Lymphocytes Relative: 11 %
Lymphs Abs: 2 K/uL (ref 0.7–4.0)
MCH: 26.2 pg (ref 26.0–34.0)
MCHC: 32 g/dL (ref 30.0–36.0)
MCV: 81.9 fL (ref 80.0–100.0)
Monocytes Absolute: 1.2 K/uL — ABNORMAL HIGH (ref 0.1–1.0)
Monocytes Relative: 6 %
Neutro Abs: 15.6 K/uL — ABNORMAL HIGH (ref 1.7–7.7)
Neutrophils Relative %: 82 %
Platelets: 204 K/uL (ref 150–400)
RBC: 4.69 MIL/uL (ref 4.22–5.81)
RDW: 18.1 % — ABNORMAL HIGH (ref 11.5–15.5)
WBC: 19 K/uL — ABNORMAL HIGH (ref 4.0–10.5)
nRBC: 0 % (ref 0.0–0.2)

## 2025-01-01 LAB — BASIC METABOLIC PANEL WITH GFR
Anion gap: 11 (ref 5–15)
BUN: 39 mg/dL — ABNORMAL HIGH (ref 8–23)
CO2: 27 mmol/L (ref 22–32)
Calcium: 10.1 mg/dL (ref 8.9–10.3)
Chloride: 94 mmol/L — ABNORMAL LOW (ref 98–111)
Creatinine, Ser: 1.75 mg/dL — ABNORMAL HIGH (ref 0.61–1.24)
GFR, Estimated: 38 mL/min — ABNORMAL LOW
Glucose, Bld: 160 mg/dL — ABNORMAL HIGH (ref 70–99)
Potassium: 5.4 mmol/L — ABNORMAL HIGH (ref 3.5–5.1)
Sodium: 131 mmol/L — ABNORMAL LOW (ref 135–145)

## 2025-01-01 LAB — CBG MONITORING, ED: Glucose-Capillary: 124 mg/dL — ABNORMAL HIGH (ref 70–99)

## 2025-01-01 MED ORDER — SODIUM ZIRCONIUM CYCLOSILICATE 10 G PO PACK
10.0000 g | PACK | Freq: Once | ORAL | Status: AC
  Administered 2025-01-01: 10 g via ORAL
  Filled 2025-01-01: qty 1

## 2025-01-01 MED ORDER — CALCIUM GLUCONATE-NACL 1-0.675 GM/50ML-% IV SOLN: 1.0000 g | Freq: Once | INTRAVENOUS | Status: DC

## 2025-01-01 NOTE — Discharge Instructions (Addendum)
 Recommend discontinuing metformin , and frequent glucose checks.  Patient had slight hyperkalemia while in the ER.  Recommend rechecking this in 2 to 3 days.

## 2025-01-01 NOTE — ED Notes (Signed)
 PTAR has been scheduled for the patient to return to [facility].  No ETA at this time. RN made aware of transport update.

## 2025-01-01 NOTE — ED Notes (Addendum)
 Attempt to call report to facility, LM on VM requesting call back with call back number

## 2025-01-01 NOTE — ED Provider Notes (Signed)
 " [place] [facility] Provider Note   CSN: [id number] Arrival date & time: 01/01/25  1542     Patient presents with: Hypoglycemia   [facility] is a 85 y.o. male.   85 year old male here for evaluation of hypoglycemia.  He is on hospice.  Reportedly has not been eating or drinking much.  He is apparently on metformin  which is scheduled to be stopped.  He used to be on insulin  but that was recently stopped as well.  Patient glucose here is 124.  He was given glucagon  and route by EMS.  When they got there it was reportedly 41.  Patient denies any other symptoms or concerns.   Hypoglycemia Associated symptoms: no seizures, no shortness of breath and no vomiting        Prior to Admission medications  Medication Sig Start Date End Date Taking? Authorizing Provider  acetaminophen  (TYLENOL ) 500 MG tablet Take 500-1,000 mg by mouth daily as needed for mild pain (pain score 1-3) or moderate pain (pain score 4-6).    [provider]  atorvastatin  (LIPITOR) 40 MG tablet Take 1 tablet (40 mg total) by mouth daily. 10/28/24   Yacopino, Jessica L, NP  Azelastine  HCl 137 MCG/SPRAY SOLN PLACE 1 SPRAY INTO BOTH NOSTRILS 2 (TWO) TIMES DAILY AS NEEDED. NEEDS APPT Patient taking differently: Place 1 spray into both nostrils daily as needed (allergies). 07/13/24   Jason Leita Repine, FNP  cyanocobalamin  1000 MCG tablet Take 1 tablet (1,000 mcg total) by mouth daily. 11/07/24   Caleen Burgess BROCKS, MD  ferrous sulfate  325 (65 FE) MG tablet Take 1 tablet (325 mg total) by mouth daily with breakfast. 11/06/24   Amin, Ankit C, MD  insulin  aspart (NOVOLOG  FLEXPEN) 100 UNIT/ML FlexPen Inject under skin  5-7 units 10-15 min before meals 3-4 times a day Patient taking differently: Inject 0-6 Units into the skin 4 (four) times daily - after meals and at bedtime. 11/10/24   Trixie File, MD  LANTUS  SOLOSTAR 100 UNIT/ML Solostar Pen Inject 12 Units into the skin daily.  12/08/24   Fairy Frames, MD  magnesium  oxide (MAG-OX) 400 (240 Mg) MG tablet TAKE 1 TABLET BY MOUTH TWICE A DAY Patient taking differently: Take 400 mg by mouth 2 (two) times daily. 02/28/24   Jason Leita Repine, FNP  metFORMIN  (GLUCOPHAGE ) 500 MG tablet Take 1 tablet (500 mg total) by mouth 2 (two) times daily with a meal. 10/28/24   Wheeler Harlene CROME, NP  Multiple Vitamins-Minerals (PRESERVISION AREDS 2) CAPS Take 1 capsule by mouth daily.    [provider]  omeprazole  (PRILOSEC) 40 MG capsule Take 1 capsule (40 mg total) by mouth in the morning. 10/28/24   Yacopino, Jessica L, NP  potassium chloride  SA (KLOR-CON  M) 20 MEQ tablet Take 1 tablet (20 mEq total) by mouth daily. 11/06/24   Amin, Ankit C, MD  torsemide  (DEMADEX ) 20 MG tablet Take 1 tablet (20 mg total) by mouth every other day. 12/07/24   Fairy Frames, MD    Allergies: Patient has no known allergies.    Review of Systems  Constitutional:  Positive for fatigue. Negative for chills and fever.  HENT:  Negative for ear pain and sore throat.   Eyes:  Negative for pain and visual disturbance.  Respiratory:  Negative for cough and shortness of breath.   Cardiovascular:  Negative for chest pain and palpitations.  Gastrointestinal:  Negative for abdominal pain and vomiting.  Genitourinary:  Negative for  dysuria and hematuria.  Musculoskeletal:  Negative for arthralgias and back pain.  Skin:  Negative for color change and rash.  Neurological:  Negative for seizures and syncope.  All other systems reviewed and are negative.   Updated Vital Signs BP 120/67   Pulse 66   Temp 98.1 F (36.7 C) (Oral)   Resp 16   Ht 5' 5 (1.651 m)   SpO2 100%   BMI 21.06 kg/m   Physical Exam Vitals and nursing note reviewed.  Constitutional:      General: He is not in acute distress.    Appearance: Normal appearance. He is well-developed. He is not ill-appearing.     Comments: Chronically ill appearing   HENT:     Head:  Normocephalic and atraumatic.  Eyes:     Conjunctiva/sclera: Conjunctivae normal.  Cardiovascular:     Rate and Rhythm: Normal rate and regular rhythm.     Heart sounds: No murmur heard. Pulmonary:     Effort: Pulmonary effort is normal. No respiratory distress.     Breath sounds: Normal breath sounds.  Abdominal:     Palpations: Abdomen is soft.     Tenderness: There is no abdominal tenderness.  Musculoskeletal:        General: No swelling.     Cervical back: Neck supple.  Skin:    General: Skin is warm and dry.     Capillary Refill: Capillary refill takes less than 2 seconds.  Neurological:     General: No focal deficit present.     Mental Status: He is alert.  Psychiatric:        Mood and Affect: Mood normal.     (all labs ordered are listed, but only abnormal results are displayed) Labs Reviewed  BASIC METABOLIC PANEL WITH GFR - Abnormal; Notable for the following components:      Result Value   Sodium 131 (*)    Potassium 5.4 (*)    Chloride 94 (*)    Glucose, Bld 160 (*)    BUN 39 (*)    Creatinine, Ser 1.75 (*)    GFR, Estimated 38 (*)    All other components within normal limits  CBC WITH DIFFERENTIAL/PLATELET - Abnormal; Notable for the following components:   WBC 19.0 (*)    Hemoglobin 12.3 (*)    HCT 38.4 (*)    RDW 18.1 (*)    Neutro Abs 15.6 (*)    Monocytes Absolute 1.2 (*)    Abs Immature Granulocytes 0.09 (*)    All other components within normal limits  CBG MONITORING, ED - Abnormal; Notable for the following components:   Glucose-Capillary 124 (*)    All other components within normal limits    EKG: None  Radiology: No results found.   Procedures   Medications Ordered in the ED  sodium zirconium cyclosilicate (LOKELMA) packet 10 g (10 g Oral Given 01/01/25 1754)                                    Medical Decision Making Patient here for episode of hypoglycemia.  Was given glucagon  by EMS and sugar improved to 120s.  It has been  120-160 here.  He drank some juice.  Recommended discontinuing metformin , he has already had his insulin  discontinued.  Found to have some very mild hyperkalemia.  Will give him a dose of Lokelma here and advised to have that rechecked in  the next week.  Advised to return for new or worsening symptoms.  He will be discharged home.  Problems Addressed: Hyperkalemia: acute illness or injury Hypoglycemia: acute illness or injury  Amount and/or Complexity of Data Reviewed External Data Reviewed: notes.    Details: Prior hospital records reviewed and patient was admitted 11-19-2024 for altered mental status Labs: ordered. Decision-making details documented in ED Course.    Details: Ordered and reviewed by me and patient with mild hyperkalemia.  Will give him Lokelma for this, no longer hypoglycemic glucose has been within normal limits  Risk OTC drugs. Prescription drug management. Diagnosis or treatment significantly limited by social determinants of health.     Final diagnoses:  Hypoglycemia  Hyperkalemia    ED Discharge Orders     None          Gennaro Duwaine CROME, DO 01/01/25 2316  "

## 2025-01-01 NOTE — ED Notes (Signed)
 Pt drank 1/4 cup of apple juice.

## 2025-01-01 NOTE — ED Triage Notes (Addendum)
 Pt BIBA from [facility] for hypoglycemia. Per EMS report, pt is on hospice, recently taken off insulin  due to pt not eating. Scheduled to be taken off metformin  but is currently still taking. Per EMS pt BG was 41 at NH, was administered glucagon , BG 121 for EMS. Pt at baseline mentation per EMS

## 2025-01-01 NOTE — ED Notes (Signed)
 Attempt to call report again to [employer]. Spoke with receptionist who transferred me to nurse line, nurse picked up phone and hung up. Unable to complete hand off report, facility is aware pt is returning. Pt has all discharge paperwork.

## 2025-01-02 ENCOUNTER — Encounter (HOSPITAL_COMMUNITY): Payer: Self-pay

## 2025-01-02 ENCOUNTER — Inpatient Hospital Stay (HOSPITAL_COMMUNITY)
Admission: EM | Admit: 2025-01-02 | Discharge: 2025-01-12 | DRG: 637 | Disposition: A | Discharge status: Skilled Nursing Facility | Attending: Student | Admitting: Student

## 2025-01-02 ENCOUNTER — Other Ambulatory Visit: Payer: Self-pay

## 2025-01-02 ENCOUNTER — Observation Stay (HOSPITAL_COMMUNITY)

## 2025-01-02 DIAGNOSIS — E861 Hypovolemia: Secondary | ICD-10-CM | POA: Diagnosis present

## 2025-01-02 DIAGNOSIS — R627 Adult failure to thrive: Secondary | ICD-10-CM | POA: Diagnosis present

## 2025-01-02 DIAGNOSIS — J9811 Atelectasis: Secondary | ICD-10-CM | POA: Diagnosis present

## 2025-01-02 DIAGNOSIS — E860 Dehydration: Secondary | ICD-10-CM | POA: Diagnosis present

## 2025-01-02 DIAGNOSIS — D72829 Elevated white blood cell count, unspecified: Secondary | ICD-10-CM | POA: Diagnosis present

## 2025-01-02 DIAGNOSIS — Z833 Family history of diabetes mellitus: Secondary | ICD-10-CM

## 2025-01-02 DIAGNOSIS — E538 Deficiency of other specified B group vitamins: Secondary | ICD-10-CM | POA: Diagnosis present

## 2025-01-02 DIAGNOSIS — Z515 Encounter for palliative care: Secondary | ICD-10-CM | POA: Diagnosis not present

## 2025-01-02 DIAGNOSIS — F0390 Unspecified dementia without behavioral disturbance: Secondary | ICD-10-CM | POA: Diagnosis present

## 2025-01-02 DIAGNOSIS — N1831 Chronic kidney disease, stage 3a: Secondary | ICD-10-CM | POA: Diagnosis present

## 2025-01-02 DIAGNOSIS — E11649 Type 2 diabetes mellitus with hypoglycemia without coma: Secondary | ICD-10-CM | POA: Diagnosis not present

## 2025-01-02 DIAGNOSIS — Z794 Long term (current) use of insulin: Secondary | ICD-10-CM | POA: Diagnosis not present

## 2025-01-02 DIAGNOSIS — Z9842 Cataract extraction status, left eye: Secondary | ICD-10-CM

## 2025-01-02 DIAGNOSIS — I130 Hypertensive heart and chronic kidney disease with heart failure and stage 1 through stage 4 chronic kidney disease, or unspecified chronic kidney disease: Secondary | ICD-10-CM | POA: Diagnosis present

## 2025-01-02 DIAGNOSIS — Z7189 Other specified counseling: Secondary | ICD-10-CM | POA: Diagnosis not present

## 2025-01-02 DIAGNOSIS — E119 Type 2 diabetes mellitus without complications: Secondary | ICD-10-CM

## 2025-01-02 DIAGNOSIS — L89892 Pressure ulcer of other site, stage 2: Secondary | ICD-10-CM | POA: Diagnosis present

## 2025-01-02 DIAGNOSIS — Z8673 Personal history of transient ischemic attack (TIA), and cerebral infarction without residual deficits: Secondary | ICD-10-CM

## 2025-01-02 DIAGNOSIS — Z87891 Personal history of nicotine dependence: Secondary | ICD-10-CM

## 2025-01-02 DIAGNOSIS — L89150 Pressure ulcer of sacral region, unstageable: Secondary | ICD-10-CM | POA: Diagnosis present

## 2025-01-02 DIAGNOSIS — E1122 Type 2 diabetes mellitus with diabetic chronic kidney disease: Secondary | ICD-10-CM | POA: Diagnosis not present

## 2025-01-02 DIAGNOSIS — I5022 Chronic systolic (congestive) heart failure: Secondary | ICD-10-CM | POA: Diagnosis present

## 2025-01-02 DIAGNOSIS — Z79899 Other long term (current) drug therapy: Secondary | ICD-10-CM

## 2025-01-02 DIAGNOSIS — Z66 Do not resuscitate: Secondary | ICD-10-CM | POA: Diagnosis present

## 2025-01-02 DIAGNOSIS — E785 Hyperlipidemia, unspecified: Secondary | ICD-10-CM | POA: Diagnosis present

## 2025-01-02 DIAGNOSIS — Z8546 Personal history of malignant neoplasm of prostate: Secondary | ICD-10-CM

## 2025-01-02 DIAGNOSIS — N179 Acute kidney failure, unspecified: Secondary | ICD-10-CM | POA: Diagnosis present

## 2025-01-02 DIAGNOSIS — E875 Hyperkalemia: Secondary | ICD-10-CM | POA: Diagnosis present

## 2025-01-02 DIAGNOSIS — E162 Hypoglycemia, unspecified: Principal | ICD-10-CM | POA: Diagnosis present

## 2025-01-02 DIAGNOSIS — Z9841 Cataract extraction status, right eye: Secondary | ICD-10-CM

## 2025-01-02 DIAGNOSIS — K219 Gastro-esophageal reflux disease without esophagitis: Secondary | ICD-10-CM | POA: Diagnosis present

## 2025-01-02 DIAGNOSIS — J189 Pneumonia, unspecified organism: Secondary | ICD-10-CM | POA: Diagnosis present

## 2025-01-02 DIAGNOSIS — Z7984 Long term (current) use of oral hypoglycemic drugs: Secondary | ICD-10-CM

## 2025-01-02 LAB — BASIC METABOLIC PANEL WITH GFR
Anion gap: 9 (ref 5–15)
BUN: 40 mg/dL — ABNORMAL HIGH (ref 8–23)
CO2: 29 mmol/L (ref 22–32)
Calcium: 10.3 mg/dL (ref 8.9–10.3)
Chloride: 94 mmol/L — ABNORMAL LOW (ref 98–111)
Creatinine, Ser: 1.63 mg/dL — ABNORMAL HIGH (ref 0.61–1.24)
GFR, Estimated: 41 mL/min — ABNORMAL LOW
Glucose, Bld: 166 mg/dL — ABNORMAL HIGH (ref 70–99)
Potassium: 5 mmol/L (ref 3.5–5.1)
Sodium: 132 mmol/L — ABNORMAL LOW (ref 135–145)

## 2025-01-02 LAB — I-STAT CG4 LACTIC ACID, ED: Lactic Acid, Venous: 1.4 mmol/L (ref 0.5–1.9)

## 2025-01-02 LAB — URINALYSIS, W/ REFLEX TO CULTURE (INFECTION SUSPECTED)
Bacteria, UA: NONE SEEN
Bilirubin Urine: NEGATIVE
Glucose, UA: NEGATIVE mg/dL
Hgb urine dipstick: NEGATIVE
Ketones, ur: NEGATIVE mg/dL
Leukocytes,Ua: NEGATIVE
Nitrite: NEGATIVE
Protein, ur: NEGATIVE mg/dL
Specific Gravity, Urine: 1.006 (ref 1.005–1.030)
pH: 8 (ref 5.0–8.0)

## 2025-01-02 LAB — HEPATIC FUNCTION PANEL
ALT: 7 U/L (ref 0–44)
AST: 16 U/L (ref 15–41)
Albumin: 3.8 g/dL (ref 3.5–5.0)
Alkaline Phosphatase: 73 U/L (ref 38–126)
Bilirubin, Direct: 0.7 mg/dL — ABNORMAL HIGH (ref 0.0–0.2)
Indirect Bilirubin: 0.5 mg/dL (ref 0.3–0.9)
Total Bilirubin: 1.2 mg/dL (ref 0.0–1.2)
Total Protein: 6.9 g/dL (ref 6.5–8.1)

## 2025-01-02 LAB — CBC WITH DIFFERENTIAL/PLATELET
Abs Immature Granulocytes: 0.08 K/uL — ABNORMAL HIGH (ref 0.00–0.07)
Basophils Absolute: 0.1 K/uL (ref 0.0–0.1)
Basophils Relative: 0 %
Eosinophils Absolute: 0.1 K/uL (ref 0.0–0.5)
Eosinophils Relative: 1 %
HCT: 37.5 % — ABNORMAL LOW (ref 39.0–52.0)
Hemoglobin: 12.3 g/dL — ABNORMAL LOW (ref 13.0–17.0)
Immature Granulocytes: 1 %
Lymphocytes Relative: 11 %
Lymphs Abs: 1.7 K/uL (ref 0.7–4.0)
MCH: 26.7 pg (ref 26.0–34.0)
MCHC: 32.8 g/dL (ref 30.0–36.0)
MCV: 81.3 fL (ref 80.0–100.0)
Monocytes Absolute: 0.9 K/uL (ref 0.1–1.0)
Monocytes Relative: 6 %
Neutro Abs: 12.8 K/uL — ABNORMAL HIGH (ref 1.7–7.7)
Neutrophils Relative %: 81 %
Platelets: 190 K/uL (ref 150–400)
RBC: 4.61 MIL/uL (ref 4.22–5.81)
RDW: 18.3 % — ABNORMAL HIGH (ref 11.5–15.5)
WBC: 15.5 K/uL — ABNORMAL HIGH (ref 4.0–10.5)
nRBC: 0 % (ref 0.0–0.2)

## 2025-01-02 LAB — CBG MONITORING, ED
Glucose-Capillary: 137 mg/dL — ABNORMAL HIGH (ref 70–99)
Glucose-Capillary: 149 mg/dL — ABNORMAL HIGH (ref 70–99)
Glucose-Capillary: 150 mg/dL — ABNORMAL HIGH (ref 70–99)
Glucose-Capillary: 159 mg/dL — ABNORMAL HIGH (ref 70–99)
Glucose-Capillary: 188 mg/dL — ABNORMAL HIGH (ref 70–99)

## 2025-01-02 LAB — HEMOGLOBIN A1C
Hgb A1c MFr Bld: 8.5 % — ABNORMAL HIGH (ref 4.8–5.6)
Mean Plasma Glucose: 197.25 mg/dL

## 2025-01-02 LAB — MAGNESIUM: Magnesium: 1.6 mg/dL — ABNORMAL LOW (ref 1.7–2.4)

## 2025-01-02 MED ORDER — SODIUM CHLORIDE 0.9 % IV SOLN
2.0000 g | INTRAVENOUS | Status: AC
  Administered 2025-01-02 – 2025-01-06 (×5): 2 g via INTRAVENOUS
  Filled 2025-01-02 (×5): qty 20

## 2025-01-02 MED ORDER — THIAMINE MONONITRATE 100 MG PO TABS
100.0000 mg | ORAL_TABLET | Freq: Every day | ORAL | Status: DC
  Administered 2025-01-02 – 2025-01-12 (×11): 100 mg via ORAL
  Filled 2025-01-02 (×11): qty 1

## 2025-01-02 MED ORDER — MAGNESIUM SULFATE 2 GM/50ML IV SOLN
2.0000 g | Freq: Once | INTRAVENOUS | Status: AC
  Administered 2025-01-02: 2 g via INTRAVENOUS
  Filled 2025-01-02: qty 50

## 2025-01-02 MED ORDER — PANTOPRAZOLE SODIUM 40 MG PO TBEC
40.0000 mg | DELAYED_RELEASE_TABLET | Freq: Every day | ORAL | Status: DC
  Administered 2025-01-02 – 2025-01-12 (×10): 40 mg via ORAL
  Filled 2025-01-02 (×10): qty 1

## 2025-01-02 MED ORDER — DEXTROSE 5 % IV SOLN: INTRAVENOUS | Status: DC

## 2025-01-02 MED ORDER — COLLAGENASE 250 UNIT/GM EX OINT
TOPICAL_OINTMENT | Freq: Every day | CUTANEOUS | Status: DC
  Filled 2025-01-02 (×3): qty 30

## 2025-01-02 MED ORDER — VITAMIN B-12 1000 MCG PO TABS
1000.0000 ug | ORAL_TABLET | Freq: Every day | ORAL | Status: DC
  Administered 2025-01-02 – 2025-01-12 (×11): 1000 ug via ORAL
  Filled 2025-01-02 (×11): qty 1

## 2025-01-02 MED ORDER — ACETAMINOPHEN 650 MG RE SUPP: 650.0000 mg | Freq: Four times a day (QID) | RECTAL | Status: DC | PRN

## 2025-01-02 MED ORDER — SODIUM CHLORIDE 0.9 % IV SOLN
500.0000 mg | INTRAVENOUS | Status: DC
  Administered 2025-01-02 – 2025-01-03 (×2): 500 mg via INTRAVENOUS
  Filled 2025-01-02 (×3): qty 5

## 2025-01-02 MED ORDER — ACETAMINOPHEN 325 MG PO TABS
650.0000 mg | ORAL_TABLET | Freq: Four times a day (QID) | ORAL | Status: DC | PRN
  Filled 2025-01-02: qty 2

## 2025-01-02 MED ORDER — DEXTROSE IN LACTATED RINGERS 5 % IV SOLN: INTRAVENOUS | Status: DC

## 2025-01-02 MED ORDER — ONDANSETRON HCL 4 MG/2ML IJ SOLN: 4.0000 mg | Freq: Four times a day (QID) | INTRAMUSCULAR | Status: DC | PRN

## 2025-01-02 MED ORDER — DEXTROSE 5 % IV SOLN: Freq: Once | INTRAVENOUS | Status: AC

## 2025-01-02 MED ORDER — DEXTROSE IN LACTATED RINGERS 5 % IV SOLN: INTRAVENOUS | Status: AC

## 2025-01-02 NOTE — ED Provider Notes (Signed)
 " [place] [facility] AT [facility] Provider Note   CSN: [id number] Arrival date & time: 01/02/25  0157     Patient presents with: Hypoglycemia and Failure To Thrive   Andre Stout is a 85 y.o. male.   Patient returns to the emergency department after being discharged earlier today and sent back to the nursing home.  Patient was evaluated here earlier for hypoglycemia, felt to be secondary to poor oral intake.  Patient was treated and discharged, found to have a low blood sugar again at the nursing home tonight.       Prior to Admission medications  Medication Sig Start Date End Date Taking? Authorizing Provider  acetaminophen  (TYLENOL ) 500 MG tablet Take 500-1,000 mg by mouth daily as needed for mild pain (pain score 1-3) or moderate pain (pain score 4-6).    [provider]  atorvastatin  (LIPITOR) 40 MG tablet Take 1 tablet (40 mg total) by mouth daily. 10/28/24   Yacopino, Jessica L, NP  Azelastine  HCl 137 MCG/SPRAY SOLN PLACE 1 SPRAY INTO BOTH NOSTRILS 2 (TWO) TIMES DAILY AS NEEDED. NEEDS APPT Patient taking differently: Place 1 spray into both nostrils daily as needed (allergies). 07/13/24   Jason Leita Repine, FNP  cyanocobalamin  1000 MCG tablet Take 1 tablet (1,000 mcg total) by mouth daily. 11/07/24   Caleen Burgess BROCKS, MD  ferrous sulfate  325 (65 FE) MG tablet Take 1 tablet (325 mg total) by mouth daily with breakfast. 11/06/24   Amin, Ankit C, MD  insulin  aspart (NOVOLOG  FLEXPEN) 100 UNIT/ML FlexPen Inject under skin  5-7 units 10-15 min before meals 3-4 times a day Patient taking differently: Inject 0-6 Units into the skin 4 (four) times daily - after meals and at bedtime. 11/10/24   Trixie File, MD  LANTUS  SOLOSTAR 100 UNIT/ML Solostar Pen Inject 12 Units into the skin daily. 12/08/24   Fairy Frames, MD  magnesium  oxide (MAG-OX) 400 (240 Mg) MG tablet TAKE 1 TABLET BY MOUTH TWICE A DAY Patient taking differently: Take 400 mg by mouth 2  (two) times daily. 02/28/24   Jason Leita Repine, FNP  metFORMIN  (GLUCOPHAGE ) 500 MG tablet Take 1 tablet (500 mg total) by mouth 2 (two) times daily with a meal. 10/28/24   Wheeler Harlene CROME, NP  Multiple Vitamins-Minerals (PRESERVISION AREDS 2) CAPS Take 1 capsule by mouth daily.    [provider]  omeprazole  (PRILOSEC) 40 MG capsule Take 1 capsule (40 mg total) by mouth in the morning. 10/28/24   Wheeler Harlene CROME, NP  potassium chloride  SA (KLOR-CON  M) 20 MEQ tablet Take 1 tablet (20 mEq total) by mouth daily. 11/06/24   Amin, Ankit C, MD  torsemide  (DEMADEX ) 20 MG tablet Take 1 tablet (20 mg total) by mouth every other day. 12/07/24   Fairy Frames, MD    Allergies: Patient has no known allergies.    Review of Systems  Updated Vital Signs BP (!) 94/56 (BP Location: Right Arm)   Pulse 71   Temp 97.8 F (36.6 C) (Oral)   Resp 16   SpO2 100%   Physical Exam Vitals and nursing note reviewed.  Constitutional:      General: He is not in acute distress.    Appearance: He is well-developed.  HENT:     Head: Normocephalic and atraumatic.     Mouth/Throat:     Mouth: Mucous membranes are moist.  Eyes:     General: Vision grossly intact. Gaze aligned appropriately.  Extraocular Movements: Extraocular movements intact.     Conjunctiva/sclera: Conjunctivae normal.  Cardiovascular:     Rate and Rhythm: Normal rate and regular rhythm.     Pulses: Normal pulses.     Heart sounds: Normal heart sounds, S1 normal and S2 normal. No murmur heard.    No friction rub. No gallop.  Pulmonary:     Effort: Pulmonary effort is normal. No respiratory distress.     Breath sounds: Normal breath sounds.  Abdominal:     Palpations: Abdomen is soft.     Tenderness: There is no abdominal tenderness. There is no guarding or rebound.     Hernia: No hernia is present.  Musculoskeletal:        General: No swelling.     Cervical back: Full passive range of motion without pain, normal  range of motion and neck supple. No pain with movement, spinous process tenderness or muscular tenderness. Normal range of motion.     Right lower leg: No edema.     Left lower leg: No edema.  Skin:    General: Skin is warm and dry.     Capillary Refill: Capillary refill takes less than 2 seconds.     Findings: No ecchymosis, erythema, lesion or wound.  Neurological:     Mental Status: He is alert. Mental status is at baseline.     GCS: GCS eye subscore is 4. GCS verbal subscore is 5. GCS motor subscore is 6.     Cranial Nerves: Cranial nerves 2-12 are intact.     Sensory: Sensation is intact.     Motor: Motor function is intact. No weakness or abnormal muscle tone.     Coordination: Coordination is intact.  Psychiatric:        Mood and Affect: Mood normal.        Speech: Speech normal.        Behavior: Behavior normal.     (all labs ordered are listed, but only abnormal results are displayed) Labs Reviewed  CBC WITH DIFFERENTIAL/PLATELET - Abnormal; Notable for the following components:      Result Value   WBC 15.5 (*)    Hemoglobin 12.3 (*)    HCT 37.5 (*)    RDW 18.3 (*)    Neutro Abs 12.8 (*)    Abs Immature Granulocytes 0.08 (*)    All other components within normal limits  BASIC METABOLIC PANEL WITH GFR - Abnormal; Notable for the following components:   Sodium 132 (*)    Chloride 94 (*)    Glucose, Bld 166 (*)    BUN 40 (*)    Creatinine, Ser 1.63 (*)    GFR, Estimated 41 (*)    All other components within normal limits  HEPATIC FUNCTION PANEL - Abnormal; Notable for the following components:   Bilirubin, Direct 0.7 (*)    All other components within normal limits  CBG MONITORING, ED - Abnormal; Notable for the following components:   Glucose-Capillary 149 (*)    All other components within normal limits  URINALYSIS, W/ REFLEX TO CULTURE (INFECTION SUSPECTED)  I-STAT CG4 LACTIC ACID, ED    EKG: EKG Interpretation Date/Time:  Saturday January 02 2025 02:09:15  EST Ventricular Rate:  73 PR Interval:    QRS Duration:  104 QT Interval:  409 QTC Calculation: 451 R Axis:   -54  Text Interpretation: AV block, 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   Procedures   Medications Ordered in the ED  dextrose  5 % solution ( Intravenous New Bag/Given 01/02/25 0218)                                    Medical Decision Making Amount and/or Complexity of Data Reviewed External Data Reviewed: labs, ECG and notes. Labs: ordered. Decision-making details documented in ED Course.  Risk Prescription drug management. Decision regarding hospitalization.   Differential diagnosis considered includes, but not limited to: TIA; Stroke; ICH; Seizure; electrolyte abnormality; hypoglycemia; toxic/pharmacologic causes; CNS infection; psychiatric disorder  Brought to the emergency department for evaluation after suffering a second hypoglycemic event tonight.  Patient was seen in the ED earlier, treated and discharged.  Patient is currently residing in a nursing home.  Patient is a DNR.  Family did not want escalation of care but it sounds as though they did not want comfort measures either.  Patient has not been eating or drinking.  According to records, patient's family has declined feeding tube.   Lab work fairly unremarkable at this time.  Started on D5 drip.     Final diagnoses:  Hypoglycemia    ED Discharge Orders     None          Eppie Barhorst, Lonni PARAS, MD 01/02/25 0304  "

## 2025-01-02 NOTE — H&P (Signed)
 " History and Physical      Andre Stout FMW:[id number] DOB: [birth year] DOA: 01/02/2025; DOS: 01/02/2025  PCP: Pcp, No  Patient coming from: home   I have personally briefly reviewed patient's old medical records in [facility]  Chief Complaint: Refractory hypoglycemia  HPI: Andre Stout is a 85 y.o. 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   In the setting of patient's advanced dementia, the following history is provided by SNF staff as well as my discussions with the EDP and via chart review.  Patient with advanced dementia, residing at [facility], who is presenting back to the emergency department a second time within 24 hours for recurrent hypoglycemia as noted by SNF staff.  SNF staff convey that the patient has had progressive decline in oral intake over the last few weeks, and has had recurrent hypoglycemic readings in spite of discontinuation of basal Lantus  a few weeks ago.  He is continue to receive sliding scale NovoLog  and metformin  in the interval.  Most recent hemoglobin A1c was 11.4% on 11/25/2024  Was found to have CBG in the 60s on 01/01/2025, prompting patient to be brought to the [facility] ED where he received D50 and was discharged back to SNF.  However, SNF staff subsequently noted additional hypoglycemia, with CBGs in the 60s, prompting EMS to be contacted again, with patient receiving glucagon  and route to the ED.   Per my discussions with EDP, patient's family had previously confirmed DNR/DNI status, but were not ready to proceed with comfort care measures at that time, nor for the interested in feeding tube in spite of the patient's significant decline in oral intake.   ED Course:  Vital signs in the ED were notable for the following:  Afebrile (to 77.  Blood pressure initially in the 90s, S[place] increasing into the low 100s following initiation of D5, respiratory 15-19, and oxygen saturation 100% on room air.  Labs were notable for the following: BMP notable for creatinine of 1.63 compared to 1.75 earlier on 01/01/2025, glucose 166 after the patient received glucagon  and route to the ED.  Magnesium  1.6.  CBC notable for white count 15,500 trending down from 19,000 on 1-26 after interval IV fluids.  Lactic acid 1.4.  No imaging performed in the ED today.  Imaging in the ED, per corresponding formal radiology read, was notable for the following:    While in the ED, the following were administered: Initiation of D5 running at 125 cc/h.  For refractory hypoglycemia  Subsequently, the patient was admitted for for further evaluation management of refractory hypoglycemia as well as hypomagnesemia.     Review of Systems: As per HPI otherwise 10 point review of systems negative.   Past Medical History:  Diagnosis Date   CKD (chronic kidney disease) stage 3, GFR 30-59 ml/min (HCC)    Diabetes mellitus without complication (HCC)    Glaucoma    Heart block    complete heart block   History of CVA (cerebrovascular accident)    Hypertension    Prostate cancer (HCC)    Been 3-4 years ago    Past Surgical History:  Procedure Laterality Date   CATARACT EXTRACTION Bilateral    INSERTION PROSTATE RADIATION SEED     IR EXCHANGE BILIARY DRAIN  08/19/2023   IR  EXCHANGE BILIARY DRAIN  12/19/2023   IR EXCHANGE BILIARY DRAIN  01/20/2024   IR PERC CHOLECYSTOSTOMY  08/17/2022   IR RADIOLOGIST EVAL & MGMT  09/28/2022   IR RADIOLOGIST EVAL & MGMT  10/10/2022    Social History:  reports that he quit smoking about 50 years ago. His smoking use included cigarettes. He started smoking about 54 years ago. He has a 2 pack-year smoking history. He has never used smokeless tobacco. He reports that he does not drink alcohol and does not use  drugs.   Allergies[1]  Family History  Problem Relation Age of Onset   Other Mother        unknown medical history   Diabetes Father    Diabetes Paternal Grandfather      Prior to Admission medications  Medication Sig Start Date End Date Taking? Authorizing Provider  acetaminophen  (TYLENOL ) 500 MG tablet Take 500-1,000 mg by mouth daily as needed for mild pain (pain score 1-3) or moderate pain (pain score 4-6).    [provider]  atorvastatin  (LIPITOR) 40 MG tablet Take 1 tablet (40 mg total) by mouth daily. 10/28/24   Yacopino, Jessica L, NP  Azelastine  HCl 137 MCG/SPRAY SOLN PLACE 1 SPRAY INTO BOTH NOSTRILS 2 (TWO) TIMES DAILY AS NEEDED. NEEDS APPT Patient taking differently: Place 1 spray into both nostrils daily as needed (allergies). 07/13/24   Jason Leita Repine, FNP  cyanocobalamin  1000 MCG tablet Take 1 tablet (1,000 mcg total) by mouth daily. 11/07/24   Amin, Burgess BROCKS, MD  ferrous sulfate  325 (65 FE) MG tablet Take 1 tablet (325 mg total) by mouth daily with breakfast. 11/06/24   Amin, Ankit C, MD  insulin  aspart (NOVOLOG  FLEXPEN) 100 UNIT/ML FlexPen Inject under skin  5-7 units 10-15 min before meals 3-4 times a day Patient taking differently: Inject 0-6 Units into the skin 4 (four) times daily - after meals and at bedtime. 11/10/24   Trixie File, MD  LANTUS  SOLOSTAR 100 UNIT/ML Solostar Pen Inject 12 Units into the skin daily. 12/08/24   Fairy Frames, MD  magnesium  oxide (MAG-OX) 400 (240 Mg) MG tablet TAKE 1 TABLET BY MOUTH TWICE A DAY Patient taking differently: Take 400 mg by mouth 2 (two) times daily. 02/28/24   Jason Leita Repine, FNP  metFORMIN  (GLUCOPHAGE ) 500 MG tablet Take 1 tablet (500 mg total) by mouth 2 (two) times daily with a meal. 10/28/24   Wheeler Harlene CROME, NP  Multiple Vitamins-Minerals (PRESERVISION AREDS 2) CAPS Take 1 capsule by mouth daily.    [provider]  omeprazole  (PRILOSEC) 40 MG capsule Take 1 capsule (40 mg  total) by mouth in the morning. 10/28/24   Yacopino, Jessica L, NP  potassium chloride  SA (KLOR-CON  M) 20 MEQ tablet Take 1 tablet (20 mEq total) by mouth daily. 11/06/24   Amin, Ankit C, MD  torsemide  (DEMADEX ) 20 MG tablet Take 1 tablet (20 mg total) by mouth every other day. 12/07/24   Fairy Frames, MD     Objective    Physical Exam: Vitals:   01/02/25 0203 01/02/25 0215 01/02/25 0230 01/02/25 0245  BP: (!) 94/56 (!) 111/55 (!) 105/54 (!) 109/55  Pulse: 71 71 65 64  Resp: 16 19 15  (!) 24  Temp:      TempSrc:      SpO2: 100% 100% 100% 100%    General: appears to be stated age; somnolent skin: warm, dry, no rash Head:  AT/[place] Mouth:  Oral mucosa membranes appear dry, normal dentition  Neck: supple; trachea midline Heart:  RRR; did not appreciate any M/R/G Lungs: CTAB, did not appreciate any wheezes, rales, or rhonchi Abdomen: + BS; soft, ND, NT Vascular: 2+ pedal pulses b/l; 2+ radial pulses b/l Extremities: no peripheral edema, no muscle wasting        Labs on Admission: I have personally reviewed following labs and imaging studies  CBC: Recent Labs  Lab 01/01/25 1621 01/02/25 0212  WBC 19.0* 15.5*  NEUTROABS 15.6* 12.8*  HGB 12.3* 12.3*  HCT 38.4* 37.5*  MCV 81.9 81.3  PLT 204 190   Basic Metabolic Panel: Recent Labs  Lab 01/01/25 1621 01/02/25 0212  NA 131* 132*  K 5.4* 5.0  CL 94* 94*  CO2 27 29  GLUCOSE 160* 166*  BUN 39* 40*  CREATININE 1.75* 1.63*  CALCIUM  10.1 10.3   GFR: CrCl cannot be calculated (Unknown ideal weight.). Liver Function Tests: Recent Labs  Lab 01/02/25 0212  AST 16  ALT 7  ALKPHOS 73  BILITOT 1.2  PROT 6.9  ALBUMIN  3.8   No results for input(s): LIPASE, AMYLASE in the last 168 hours. No results for input(s): AMMONIA in the last 168 hours. Coagulation Profile: No results for input(s): INR, PROTIME in the last 168 hours. Cardiac Enzymes: No results for input(s): CKTOTAL, CKMB, CKMBINDEX, TROPONINI  in the last 168 hours. BNP (last 3 results) No results for input(s): PROBNP in the last 8760 hours. HbA1C: No results for input(s): HGBA1C in the last 72 hours. CBG: Recent Labs  Lab 01/01/25 1604 01/02/25 0201  GLUCAP 124* 149*   Lipid Profile: No results for input(s): CHOL, HDL, LDLCALC, TRIG, CHOLHDL, LDLDIRECT in the last 72 hours. Thyroid  Function Tests: No results for input(s): TSH, T4TOTAL, FREET4, T3FREE, THYROIDAB in the last 72 hours. Anemia Panel: No results for input(s): VITAMINB12, FOLATE, FERRITIN, TIBC, IRON, RETICCTPCT in the last 72 hours. Urine analysis:    Component Value Date/Time   COLORURINE YELLOW 11/21/2024 1229   APPEARANCEUR CLEAR 11/21/2024 1229   LABSPEC 1.013 11/21/2024 1229   PHURINE 7.0 11/21/2024 1229   GLUCOSEU >=500 (A) 11/21/2024 1229   HGBUR NEGATIVE 11/21/2024 1229   BILIRUBINUR NEGATIVE 11/21/2024 1229   KETONESUR NEGATIVE 11/21/2024 1229   PROTEINUR 100 (A) 11/21/2024 1229   UROBILINOGEN 1.0 10/04/2009 1109   NITRITE NEGATIVE 11/21/2024 1229   LEUKOCYTESUR NEGATIVE 11/21/2024 1229    Radiological Exams on Admission: No results found.    Assessment/Plan   Principal Problem:   Hypoglycemia Active Problems:   Leukocytosis   CKD stage 3a, GFR 45-59 ml/min (HCC)   Hypomagnesemia   DM2 (diabetes mellitus, type 2) (HCC)      # Presumed refractory hypoglycemia: Sec presentation within last 24 hours for hypoglycemia, with suspected significant contribution from further progression in decline in oral intake in the context of advanced dementia.  Is continued to experience recurrent hypoglycemic episodes in spite of recent discontinuation of basal insulin  a few weeks ago.  However, it appears that he has continued to receive sliding scale NovoLog .  This on the context of a documented history of type 2 diabetes mellitus with most recent within his 11.4% on 11/25/2024.  As above, prior discussions  with family were reported to yield the following: While family confirmed DNR/DNI, they would not repair for comfort care measures were they want feeding tube.  It appears that the patient would benefit from further goals of clarification.  In meantime, we will proceed with supportive measures.  Of note, we do not have  any D5LR in stock at this time.  Consequently, we will resume previously initiated D5 W, but will reduce rate to 75 cc/h   Plan: D5W 75 cc/h, as above.  Every 2 hours CBG monitoring x 2 followed by every 4 hours CBG monitoring.  Placed consult with [facility] care service for assistance with goals of care, as above.  Monitor Dizon's and weights.  Check urinalysis to evaluate for any underlying infectious contribution..  N.p.o. for now until confirmed the patient is able to safely swallow with RN bedside swallow screen.             #) hypomagnesemia: Present magnesium  low 1.6  Plan: Magnesium  sulfate 2 g IV over 2 hours.  For now, monitor on telemetry.       [name]                       #) Leukocytosis: Mildly elevated white cell count of 15,500, which is trending down from 19,000 within the last day with interval IV fluids, suspicious for an element of hemoconcentration in setting of presenting dehydration due to a decline in oral intake.  No overt evidence of underlying factious process at this time, but will check urinalysis, particularly given his refractory hypoglycemia.  Afebrile.  In the absence of overt underlying factious process, criteria for sepsis are not currently met.  Lactate nonelevated at 1.4.  Plan: IV fluids, as above.  Monitor strict I's and O's Daily weights.  Check urinalysis.                     #) CKD Stage 3A: Documented history of such, with baseline creatinine 1.3-1.7, with presenting creatinine consistent with this baseline.   Plan: Monitor strict I's and O's and daily weights.  Attempt to avoid nephrotoxic  agents.  CMP/magnesium  level in the AM.            DVT prophylaxis: SCD's   Code Status: DNR/DNI.  Confirmed via MOST and active DNR form.  Family Communication: none Disposition Plan: Per Rounding Team Consults called: palaitive care consult placed;  Admission status: obs     I SPENT GREATER THAN 75  MINUTES IN CLINICAL CARE TIME/MEDICAL DECISION-MAKING IN COMPLETING THIS ADMISSION.      Jazlene Bares B Salote Weidmann DO [facility]  From 7PM - 7AM   01/02/2025, 4:06 AM         [1] No Known Allergies  "

## 2025-01-02 NOTE — Progress Notes (Addendum)
 " PROGRESS NOTE    Andre Stout  FMW:[id number] DOB: 1940/12/21 DOA: 01/02/2025 PCP: Pcp, No  Chief Complaint  Patient presents with   Hypoglycemia   Failure To Thrive    Brief Narrative:   Andre Stout is Andre Stout 85 y.o. male with medical history significant for type 2 diabetes mellitus, advanced dementia, CKD 3A with baseline creatinine 1.3-1.7 who is admitted to [facility] on 01/02/2025 with refractory hypoglycemia after presenting from SNF to [facility] ED complaining of refractory hypoglycemia.   Assessment & Plan:   Principal Problem:   Hypoglycemia Active Problems:   Leukocytosis   CKD stage 3a, GFR 45-59 ml/min (HCC)   Hypomagnesemia   DM2 (diabetes mellitus, type 2) (HCC)  Goals of Care He's DNR/DNI.  His daughter, [name], is clear they're not interested in feeding tubes or aggressive interventions/procedures.  Continuing supportive care for now.  Additional GOC conversations as appropriate based on his progress.  He has Syndi Pua most form from 12/3. Appreciate palliative care assistance.  Hypoglycemia Type 2 Diabetes In setting of poor PO intake/insulin  A1c 8.5 (down from 11.4 in 10/2024).  Basal insulin  was discontinued Kaleab Frasier few weeks ago.  Had been receiving SSI and metformin .  Last dose of metformin  and SSI were given 01/02/2024.   Liberalize diet.  Hold further insulin  and oral diabetes medicines.  These should be discontinued long term. Continue D5LR until afternoon, will d/c after and monitor BG's with PO intake.    Leukocytosis Follow CXR, UA with reflex culture Has sacral decubitus wound that his daughter said had been recently cultured, concern for infection.  On my exam, doesn't appear actively infected. Follow sed rate, CRP, consider additional imaging if significantly elevated Trend, he's afebrile  2:02 PM CXR with opacity in L mid/lower lung.  With leukocytosis above, will start abx.  Blood cultures.  AKI on CKD IIIa In setting of poor PO  intake/hypovolemia/dehydration Baseline creatinine 1.25.  1.75 on presentation Follow with IVF Follow UA.  Consider renal imaging if not improving as expected  Dementia At baseline, he knows family, but not time/location generally. At home he was walking with walker, but he declined at his recent hospitalization - discharged to [facility] as [name] result.  Appetite has fluctuated over the past year.    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  Disposition:   Status is: Observation The patient remains OBS appropriate and will d/c before 2 midnights.   Consultants:  none  Procedures:  Echo IMPRESSIONS     1. Left ventricular ejection fraction, by estimation, is 35 to 40%. The  left ventricle has moderately decreased function. The left ventricle  demonstrates regional wall motion abnormalities (see scoring  diagram/findings for description). Left ventricular   diastolic function could not be evaluated.   2. Right ventricular systolic function is mildly reduced. The right  ventricular size is normal.   3. Left atrial size was severely dilated.   4. Right atrial size was mildly dilated.   5. The mitral valve is normal in structure. Mild mitral valve  regurgitation. No evidence of mitral stenosis.   6. The aortic valve is normal in structure. Aortic valve regurgitation is  not visualized. No aortic stenosis is present.   7. The inferior vena cava is dilated in size with <50% respiratory  variability, suggesting right atrial pressure of 15 mmHg.   Comparison(s): Compared to prior echo, EF is reduced and there are new  wall motion abnormalities.   LE  US  Summary:  RIGHT:      - There is no evidence of deep vein thrombosis in the lower extremity.  However, portions of this examination were limited- see technologist  comments above.    - No cystic structure found in the popliteal fossa.    LEFT:      - There is no evidence of deep vein thrombosis in the lower extremity.  However, portions of this examination were limited- see technologist  comments above.    - No cystic structure found in the popliteal fossa.   Antimicrobials:  Anti-infectives (From admission, onward)    None       Subjective:  No complaints  Objective: Vitals:   01/02/25 0630 01/02/25 0645 01/02/25 0700 01/02/25 0715  BP: 105/66 119/62 115/69 121/65  Pulse: 64 64 62 69  Resp: 13 14 13 14   Temp:      TempSrc:      SpO2: 100% 100% 100% 100%   No intake or output data in the 24 hours ending 01/02/25 0817 There were no vitals filed for this visit.  Examination:  General exam: chronically ill appearing 85 yo gentleman, curled up in stretcher in fetal position Respiratory system: unlabored Cardiovascular system:RRR Central nervous system: Alert, knew hospital, but not which.  Not aware of month, year, or reason for hospitalization. Moving all extremities. Extremities: no lee  Data Reviewed: I have personally reviewed following labs and imaging studies  CBC: Recent Labs  Lab 01/01/25 1621 01/02/25 0212  WBC 19.0* 15.5*  NEUTROABS 15.6* 12.8*  HGB 12.3* 12.3*  HCT 38.4* 37.5*  MCV 81.9 81.3  PLT 204 190    Basic Metabolic Panel: Recent Labs  Lab 01/01/25 1621 01/02/25 0212  NA 131* 132*  K 5.4* 5.0  CL 94* 94*  CO2 27 29  GLUCOSE 160* 166*  BUN 39* 40*  CREATININE 1.75* 1.63*  CALCIUM  10.1 10.3  MG  --  1.6*    GFR: CrCl cannot be calculated (Unknown ideal weight.).  Liver Function Tests: Recent Labs  Lab 01/02/25 0212  AST 16  ALT 7  ALKPHOS 73  BILITOT 1.2  PROT 6.9  ALBUMIN  3.8    CBG: Recent Labs   Lab 01/01/25 1604 01/02/25 0201 01/02/25 0557 01/02/25 0748  GLUCAP 124* 149* 150* 137*     No results found for this or any previous visit (from the past 240 hours).       Radiology Studies: No results found.      Scheduled Meds: Continuous Infusions:  dextrose      magnesium  sulfate bolus IVPB 2 g (01/02/25 0721)     LOS: 0 days    Time spent: over 30 min     Meliton Monte, MD [facility]   To contact the attending provider between 7A-7P or the covering provider during after hours 7P-7A, please log into the web site [email or web address] and access using universal [place] password for that web site. If you do not have the password, please call the hospital operator.  01/02/2025, 8:17 AM    "

## 2025-01-02 NOTE — ED Triage Notes (Signed)
 Patient is coming from [facility] for hypoglycemia and failure to thrive. Patient was seen here earlier today for the same. Patient has been refusing to eat and drink at facility and has been uncooperative with care. CBG at facility was 46, facility gave 0.5 of glucagon . EMS VS  100/50 BP 64 HR 98% RA 87 CBG

## 2025-01-02 NOTE — Consult Note (Signed)
 WOC Nurse Consult Note: Reason for Consult: decubitus ulcer  Patient known to WOC service from last admissions; last seen 12/07/24 at which time his pressure injury was unstageable  Wound type: Stage 3 Pressure injury Measurement: see nursing flow sheets Wound bed: full thickness; fibrinous base  Drainage (amount, consistency, odor) see nursing flow sheets Periwound: intact; scarring from previous wounds  Dressing procedure/placement/frequency: Continue enzymatic debridement to the sacral wound; top with saline moist gauze; and foam Apply daily Turn and reposition per hospital policy Add LALM for moisture and pressure redistribution  Re consult if needed, will not follow at this time. Thanks  Minsa [employer], RN,CWOCN, CNS, [employer] [phone]

## 2025-01-02 NOTE — ED Notes (Signed)
 Pt refusing blood work. Attempted to start second IV to obtain blood cultures, pt tried to hit RN and began cursing saying he's not having his blood taken MD notified

## 2025-01-02 NOTE — Consult Note (Signed)
 "                                                  Palliative Care Consult Note                                  Date: 01/02/2025   Patient Name: Andre Stout  DOB: 1940/03/12  MRN: [id number]  Age / Sex: 85 y.o., male  PCP: Pcp, No Referring Physician: Perri DELENA Meliton Mickey., *  Reason for Consultation: Establishing goals of care  HPI/Patient Profile: 85 y.o. male  with past medical history of advanced dementia, type 2 diabetes mellitus, CKD 3a with baseline creatinine 1.3-1.7, complete heart block, and prostate cancer admitted on 01/02/2025 with refractory hypoglycemia.   Patient has had four inpatient hospitalizations over the last six months. Patient is familiar to palliative care team.  Patient faces treatment option decisions, advanced directive decisions, and anticipatory care needs. PMT consulted for goals of care.    Past Medical History:  Diagnosis Date   CKD (chronic kidney disease) stage 3, GFR 30-59 ml/min (HCC)    Diabetes mellitus without complication (HCC)    Glaucoma    Heart block    complete heart block   History of CVA (cerebrovascular accident)    Hypertension    Prostate cancer (HCC)    Been 3-4 years ago    Subjective:   I have reviewed medical records including EPIC notes, labs and imaging, received updates from nursing and attending provider, assessed the patient and then spoke with the patient's daughter Michaelle Idell Sayres to discuss diagnosis prognosis, GOC, EOL wishes, disposition and options.  I introduced Palliative Medicine as specialized medical care for people living with serious illness. It focuses on providing relief from symptoms and stress of a serious illness. The goal is to improve quality of life for both the patient and the family. She is familiar with PMT from previous hospitalizations.  Today's Discussion: Patient lying in bed in no apparent distress. He says he is chilly. When I bring him a warm blanket he states now that's better.  Patient is pleasant. He is oriented to self and location. I told him he was at the hospital for low blood sugar. He tells me his daughter is the person who helps him.  Spoke to patient's daughter Michaelle Idell Sayres by phone. She is the patient's only child and proxy decision maker (she is also a engineer, civil (consulting)). We briefly discussed a life review of the patient. He is widowed. He was in the [employer] and worked at [employer] before retiring. In retirement he remained active by hunting, fishing, and traveling. Over the last years his functional status has declined. She has a good understanding of his chronic conditions and acute hospitalization.  Prior to this admission the patient was at [facility] nursing home for rehab. ED note states patient had been refusing to eat or drink at the SNF.She is unsure of his oral intake while he was there but understands he was working with therapies.   Confirmed MOST form completed 12/02/2024 remains accurate. Patient is DNR/DNI and would not want aggressive interventions such as a feeding tube. Patient's daughter would like time for outcomes to see his oral intake and how his blood glucose does without diabetes medications.  Discussed the importance of continued conversation with family and the medical providers regarding overall plan of care and treatment options, ensuring decisions are within the context of the patient's values and GOCs.  Questions and concerns were addressed. The family was encouraged to call with questions or concerns. PMT will continue to support holistically.  Review of Systems  Unable to perform ROS   Objective:   Primary Diagnoses: Present on Admission:  Hypoglycemia  Hypomagnesemia  Leukocytosis  CKD stage 3a, GFR 45-59 ml/min [facility])   Physical Exam Vitals reviewed.  Constitutional:      General: He is awake. He is not in acute distress. HENT:     Head: Normocephalic and atraumatic.  Cardiovascular:     Rate and Rhythm: Normal rate.   Pulmonary:     Effort: Pulmonary effort is normal.  Skin:    General: Skin is warm and dry.  Neurological:     Mental Status: Mental status is at baseline. He is disoriented.  Psychiatric:        Mood and Affect: Mood normal.        Behavior: Behavior normal.     Vital Signs:  BP 124/64   Pulse 61   Temp 97.6 F (36.4 C) (Oral)   Resp 14   SpO2 100%     Advanced Care Planning:   Existing Vynca/ACP Documentation: DNR and MOST (12/02/24)  Primary Decision Maker: NEXT OF KIN  Code Status/Advance Care Planning: DNR   Assessment & Plan:   SUMMARY OF RECOMMENDATIONS   DNR/DNI No feeding tubes Time for outcomes Continued PMT support   Discussed with: bedside RN and Dr. Perri  Time Total: 45 minutes    Thank you for allowing us  to participate in the care of DRESHON PROFFIT PMT will continue to support holistically.   Signed by: Stephane Palin, NP Palliative Medicine Team  Team Phone # [phone] (Nights/Weekends)  01/02/2025, 9:25 AM   "

## 2025-01-03 DIAGNOSIS — E1122 Type 2 diabetes mellitus with diabetic chronic kidney disease: Secondary | ICD-10-CM | POA: Diagnosis present

## 2025-01-03 DIAGNOSIS — E162 Hypoglycemia, unspecified: Secondary | ICD-10-CM | POA: Diagnosis present

## 2025-01-03 DIAGNOSIS — F0390 Unspecified dementia without behavioral disturbance: Secondary | ICD-10-CM | POA: Diagnosis present

## 2025-01-03 DIAGNOSIS — E875 Hyperkalemia: Secondary | ICD-10-CM | POA: Diagnosis present

## 2025-01-03 DIAGNOSIS — I5022 Chronic systolic (congestive) heart failure: Secondary | ICD-10-CM | POA: Diagnosis present

## 2025-01-03 DIAGNOSIS — Z833 Family history of diabetes mellitus: Secondary | ICD-10-CM | POA: Diagnosis not present

## 2025-01-03 DIAGNOSIS — E119 Type 2 diabetes mellitus without complications: Secondary | ICD-10-CM

## 2025-01-03 DIAGNOSIS — Z794 Long term (current) use of insulin: Secondary | ICD-10-CM | POA: Diagnosis not present

## 2025-01-03 DIAGNOSIS — N1831 Chronic kidney disease, stage 3a: Secondary | ICD-10-CM | POA: Diagnosis present

## 2025-01-03 DIAGNOSIS — Z515 Encounter for palliative care: Secondary | ICD-10-CM | POA: Diagnosis not present

## 2025-01-03 DIAGNOSIS — Z87891 Personal history of nicotine dependence: Secondary | ICD-10-CM | POA: Diagnosis not present

## 2025-01-03 DIAGNOSIS — Z66 Do not resuscitate: Secondary | ICD-10-CM | POA: Diagnosis present

## 2025-01-03 DIAGNOSIS — E860 Dehydration: Secondary | ICD-10-CM | POA: Diagnosis present

## 2025-01-03 DIAGNOSIS — L89150 Pressure ulcer of sacral region, unstageable: Secondary | ICD-10-CM | POA: Diagnosis present

## 2025-01-03 DIAGNOSIS — J189 Pneumonia, unspecified organism: Secondary | ICD-10-CM | POA: Diagnosis present

## 2025-01-03 DIAGNOSIS — I130 Hypertensive heart and chronic kidney disease with heart failure and stage 1 through stage 4 chronic kidney disease, or unspecified chronic kidney disease: Secondary | ICD-10-CM | POA: Diagnosis present

## 2025-01-03 DIAGNOSIS — I442 Atrioventricular block, complete: Secondary | ICD-10-CM | POA: Diagnosis not present

## 2025-01-03 DIAGNOSIS — Z7984 Long term (current) use of oral hypoglycemic drugs: Secondary | ICD-10-CM | POA: Diagnosis not present

## 2025-01-03 DIAGNOSIS — D72829 Elevated white blood cell count, unspecified: Secondary | ICD-10-CM | POA: Diagnosis not present

## 2025-01-03 DIAGNOSIS — K219 Gastro-esophageal reflux disease without esophagitis: Secondary | ICD-10-CM | POA: Diagnosis present

## 2025-01-03 DIAGNOSIS — L89892 Pressure ulcer of other site, stage 2: Secondary | ICD-10-CM | POA: Diagnosis present

## 2025-01-03 DIAGNOSIS — J9811 Atelectasis: Secondary | ICD-10-CM | POA: Diagnosis present

## 2025-01-03 DIAGNOSIS — N179 Acute kidney failure, unspecified: Secondary | ICD-10-CM | POA: Diagnosis present

## 2025-01-03 DIAGNOSIS — R627 Adult failure to thrive: Secondary | ICD-10-CM | POA: Diagnosis present

## 2025-01-03 DIAGNOSIS — E11649 Type 2 diabetes mellitus with hypoglycemia without coma: Secondary | ICD-10-CM | POA: Diagnosis present

## 2025-01-03 DIAGNOSIS — Z8546 Personal history of malignant neoplasm of prostate: Secondary | ICD-10-CM | POA: Diagnosis not present

## 2025-01-03 DIAGNOSIS — E785 Hyperlipidemia, unspecified: Secondary | ICD-10-CM | POA: Diagnosis present

## 2025-01-03 DIAGNOSIS — Z79899 Other long term (current) drug therapy: Secondary | ICD-10-CM | POA: Diagnosis not present

## 2025-01-03 DIAGNOSIS — Z7189 Other specified counseling: Secondary | ICD-10-CM | POA: Diagnosis not present

## 2025-01-03 LAB — CBC WITH DIFFERENTIAL/PLATELET
Abs Immature Granulocytes: 0.02 K/uL (ref 0.00–0.07)
Basophils Absolute: 0.1 K/uL (ref 0.0–0.1)
Basophils Relative: 1 %
Eosinophils Absolute: 0.2 K/uL (ref 0.0–0.5)
Eosinophils Relative: 2 %
HCT: 38.6 % — ABNORMAL LOW (ref 39.0–52.0)
Hemoglobin: 12.3 g/dL — ABNORMAL LOW (ref 13.0–17.0)
Immature Granulocytes: 0 %
Lymphocytes Relative: 32 %
Lymphs Abs: 2.2 K/uL (ref 0.7–4.0)
MCH: 26.1 pg (ref 26.0–34.0)
MCHC: 31.9 g/dL (ref 30.0–36.0)
MCV: 81.8 fL (ref 80.0–100.0)
Monocytes Absolute: 0.6 K/uL (ref 0.1–1.0)
Monocytes Relative: 9 %
Neutro Abs: 3.9 K/uL (ref 1.7–7.7)
Neutrophils Relative %: 56 %
Platelets: 188 K/uL (ref 150–400)
RBC: 4.72 MIL/uL (ref 4.22–5.81)
RDW: 17.9 % — ABNORMAL HIGH (ref 11.5–15.5)
WBC: 7 K/uL (ref 4.0–10.5)
nRBC: 0 % (ref 0.0–0.2)

## 2025-01-03 LAB — COMPREHENSIVE METABOLIC PANEL WITH GFR
ALT: 9 U/L (ref 0–44)
AST: 25 U/L (ref 15–41)
Albumin: 3.5 g/dL (ref 3.5–5.0)
Alkaline Phosphatase: 75 U/L (ref 38–126)
Anion gap: 11 (ref 5–15)
BUN: 28 mg/dL — ABNORMAL HIGH (ref 8–23)
CO2: 25 mmol/L (ref 22–32)
Calcium: 9.9 mg/dL (ref 8.9–10.3)
Chloride: 96 mmol/L — ABNORMAL LOW (ref 98–111)
Creatinine, Ser: 1.22 mg/dL (ref 0.61–1.24)
GFR, Estimated: 58 mL/min — ABNORMAL LOW
Glucose, Bld: 144 mg/dL — ABNORMAL HIGH (ref 70–99)
Potassium: 4.4 mmol/L (ref 3.5–5.1)
Sodium: 132 mmol/L — ABNORMAL LOW (ref 135–145)
Total Bilirubin: 1.3 mg/dL — ABNORMAL HIGH (ref 0.0–1.2)
Total Protein: 6.7 g/dL (ref 6.5–8.1)

## 2025-01-03 LAB — GLUCOSE, CAPILLARY
Glucose-Capillary: 270 mg/dL — ABNORMAL HIGH (ref 70–99)
Glucose-Capillary: 335 mg/dL — ABNORMAL HIGH (ref 70–99)

## 2025-01-03 LAB — STREP PNEUMONIAE URINARY ANTIGEN: Strep Pneumo Urinary Antigen: NEGATIVE

## 2025-01-03 LAB — CBG MONITORING, ED
Glucose-Capillary: 139 mg/dL — ABNORMAL HIGH (ref 70–99)
Glucose-Capillary: 158 mg/dL — ABNORMAL HIGH (ref 70–99)
Glucose-Capillary: 200 mg/dL — ABNORMAL HIGH (ref 70–99)

## 2025-01-03 LAB — MAGNESIUM: Magnesium: 1.8 mg/dL (ref 1.7–2.4)

## 2025-01-03 LAB — PHOSPHORUS: Phosphorus: 2.9 mg/dL (ref 2.5–4.6)

## 2025-01-03 MED ORDER — INSULIN ASPART 100 UNIT/ML IJ SOLN
0.0000 [IU] | Freq: Three times a day (TID) | INTRAMUSCULAR | Status: DC
  Administered 2025-01-04: 4 [IU] via SUBCUTANEOUS
  Administered 2025-01-04: 1 [IU] via SUBCUTANEOUS
  Filled 2025-01-03: qty 4
  Filled 2025-01-03: qty 1

## 2025-01-03 MED ORDER — INSULIN ASPART 100 UNIT/ML IJ SOLN
0.0000 [IU] | Freq: Every day | INTRAMUSCULAR | Status: DC
  Administered 2025-01-03: 4 [IU] via SUBCUTANEOUS
  Filled 2025-01-03: qty 4

## 2025-01-03 NOTE — Care Plan (Signed)
" ° ° ° °  Chart reviewed. Plan remain allowing time for outcomes to see his oral intake and how his blood glucose does without diabetes medications. No PMT needs today.  Thank you for your referral and allowing PMT to assist in [place]'s care.   Stephane Palin, NP Palliative Medicine Team  Team Phone # [phone]   NO CHARGE  "

## 2025-01-03 NOTE — Progress Notes (Signed)
 [facility]  PROGRESS NOTE  Andre Stout FMW:[id number] DOB: 08/15/1940 DOA: 01/02/2025 PCP: Pcp, No   Brief HPI:   85 y.o. male with medical history significant for type 2 diabetes mellitus, advanced dementia, CKD 3A with baseline creatinine 1.3-1.7 who is admitted to [facility] on 01/02/2025 with refractory hypoglycemia after presenting from SNF to [facility] ED complaining of refractory hypoglycemia.     Assessment/Plan:   Hypoglycemia - Likely in setting of poor p.o. intake - A1c 8.5, basal insulin  was discontinued a few weeks ago. - Has been getting sliding scale insulin  with NovoLog  with metformin  - CBG has improved after stopping insulin  - Continue CBG monitoring every 4 hours  Leukocytosis -Resolved; he is afebrile -He was started on antibiotics on 01/02/2025 -Chest x-ray showed new opacity in the left mid/lower lung atelectasis or infiltrate -UA is clear  ?  Pneumonia -Chest x-ray showed new opacity in the left mid/lower lung, atelectasis or infiltrate -Started on  ceftriaxone  and Zithromax  - Follow-up blood culture results  Acute kidney injury on CKD stage IIIa - Insetting of poor p.o. 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% on echo from 10/2024 Hold diuretic in setting of aki above   Decubitus Ulcer Appreciate WOC c/s   Hypomagnesemia Replete  B12 deficiency -B12 less than 150 Supplement   Dyslipidemia Statin on hold   GERD Continue pantoprazole   Goals of care - Palliative care consulted - Patient is DNR/DNI - No feeding tubes    DVT prophylaxis: SCDs  Medications     collagenase    Topical Daily   vitamin B-12  1,000 mcg Oral Daily   pantoprazole   40 mg Oral Daily   thiamine   100 mg Oral Daily     Data Reviewed:    CBG:  Recent Labs  Lab 01/02/25 0748 01/02/25 1150 01/02/25 2334 01/03/25 0443 01/03/25 0737  GLUCAP 137* 159* 188* 139* 158*    SpO2: 100 %    Vitals:   01/03/25 0230 01/03/25 0245 01/03/25 0430 01/03/25 0605  BP: 122/61 98/66 126/79 (!) 113/52  Pulse:   67 67  Resp: 15 11 15 15   Temp:   97.9 F (36.6 C)   TempSrc:   Oral   SpO2:   100% 100%      Data Reviewed:  Basic Metabolic Panel: Recent Labs  Lab 01/01/25 1621 01/02/25 0212 01/03/25 0500  NA 131* 132* 132*  K 5.4* 5.0 4.4  CL 94* 94* 96*  CO2 27 29 25   GLUCOSE 160* 166* 144*  BUN 39* 40* 28*  CREATININE 1.75* 1.63* 1.22  CALCIUM  10.1 10.3 9.9  MG  --  1.6* 1.8  PHOS  --   --  2.9    CBC: Recent Labs  Lab 01/01/25 1621 01/02/25 0212 01/03/25 0500  WBC 19.0* 15.5* 7.0  NEUTROABS 15.6* 12.8* 3.9  HGB 12.3* 12.3* 12.3*  HCT 38.4* 37.5* 38.6*  MCV 81.9 81.3 81.8  PLT 204 190 188    LFT Recent Labs  Lab 01/02/25 0212 01/03/25 0500  AST 16 25  ALT 7 9  ALKPHOS 73 75  BILITOT 1.2 1.3*  PROT 6.9 6.7  ALBUMIN  3.8 3.5     Antibiotics: Anti-infectives (From admission, onward)    Start     Dose/Rate Route Frequency  Ordered Stop   01/02/25 1415  cefTRIAXone  (ROCEPHIN ) 2 g in sodium chloride  0.9 % 100 mL IVPB        2 g 200 mL/hr over 30 Minutes Intravenous Every 24 hours 01/02/25 1402 01/07/25 1414   01/02/25 1415  azithromycin  (ZITHROMAX ) 500 mg in sodium chloride  0.9 % 250 mL IVPB        500 mg 250 mL/hr over 60 Minutes Intravenous Every 24 hours 01/02/25 1402 01/07/25 1414        CONSULTS   Code Status: DNR  Family Communication: No family at bedside     Subjective   Patient seen and examined, denies any complaints.   Objective    Physical Examination:   Appears in no acute distress S1-S2, regular Alert, oriented to self and place only Abdomen is soft, nontender     Wound 11/25/24 1300 Pressure Injury Sacrum Medial Unstageable - Full thickness tissue  loss in which the base of the injury is covered by slough (yellow, tan, gray, green or brown) and/or eschar (tan, brown or black) in the wound bed. (Active)     Wound Pressure Injury Scrotum Medial Stage 2 -  Partial thickness loss of dermis presenting as a shallow open injury with a red, pink wound bed without slough. (Active)        Andre Stout   [facility] If 7PM-7AM, please contact night-coverage at [email or web address], Office  [phone]   01/03/2025, 7:50 AM  LOS: 0 days

## 2025-01-03 NOTE — ED Notes (Signed)
 Pt pulled out IV

## 2025-01-03 NOTE — ED Notes (Addendum)
 Pt gown changed and repositioned.

## 2025-01-03 NOTE — ED Notes (Signed)
 Pt pulled off condom cath and covered in urine. Pt cleaned, changed and placed in brief. Pt very agitated during with this process.

## 2025-01-03 NOTE — Progress Notes (Signed)
" ° °  Brief Progress Note   _____________________________________________________________________________________________________________  Patient Name: Andre Stout Patient DOB: 05-06-1940 Date: @TODAY @      Data: Reviewed labs, notes, VS.    Action: No action needed at this time.      Response:    _____________________________________________________________________________________________________________  [facility] RN [facility] Sharolyn JONETTA Batman Please contact us  directly via secure chat (search for [facility]) or by calling us  at [phone] [facility]).  "

## 2025-01-03 NOTE — Plan of Care (Signed)

## 2025-01-04 DIAGNOSIS — Z7189 Other specified counseling: Secondary | ICD-10-CM

## 2025-01-04 DIAGNOSIS — Z515 Encounter for palliative care: Secondary | ICD-10-CM | POA: Diagnosis not present

## 2025-01-04 DIAGNOSIS — I442 Atrioventricular block, complete: Secondary | ICD-10-CM

## 2025-01-04 DIAGNOSIS — Z8546 Personal history of malignant neoplasm of prostate: Secondary | ICD-10-CM

## 2025-01-04 LAB — GLUCOSE, CAPILLARY
Glucose-Capillary: 160 mg/dL — ABNORMAL HIGH (ref 70–99)
Glucose-Capillary: 195 mg/dL — ABNORMAL HIGH (ref 70–99)
Glucose-Capillary: 285 mg/dL — ABNORMAL HIGH (ref 70–99)
Glucose-Capillary: 295 mg/dL — ABNORMAL HIGH (ref 70–99)
Glucose-Capillary: 321 mg/dL — ABNORMAL HIGH (ref 70–99)
Glucose-Capillary: 420 mg/dL — ABNORMAL HIGH (ref 70–99)
Glucose-Capillary: 431 mg/dL — ABNORMAL HIGH (ref 70–99)

## 2025-01-04 LAB — LEGIONELLA PNEUMOPHILA SEROGP 1 UR AG: L. pneumophila Serogp 1 Ur Ag: NEGATIVE

## 2025-01-04 MED ORDER — INSULIN GLARGINE-YFGN 100 UNIT/ML ~~LOC~~ SOLN
5.0000 [IU] | Freq: Every day | SUBCUTANEOUS | Status: DC
  Administered 2025-01-04 – 2025-01-05 (×2): 5 [IU] via SUBCUTANEOUS
  Filled 2025-01-04 (×3): qty 0.05

## 2025-01-04 MED ORDER — INSULIN ASPART 100 UNIT/ML IJ SOLN
0.0000 [IU] | Freq: Three times a day (TID) | INTRAMUSCULAR | Status: DC
  Administered 2025-01-04: 5 [IU] via SUBCUTANEOUS
  Filled 2025-01-04: qty 5

## 2025-01-04 MED ORDER — INSULIN ASPART 100 UNIT/ML IJ SOLN
0.0000 [IU] | Freq: Three times a day (TID) | INTRAMUSCULAR | Status: DC
  Administered 2025-01-05: 5 [IU] via SUBCUTANEOUS
  Administered 2025-01-05: 2 [IU] via SUBCUTANEOUS
  Administered 2025-01-05: 1 [IU] via SUBCUTANEOUS
  Administered 2025-01-06: 7 [IU] via SUBCUTANEOUS
  Administered 2025-01-06: 5 [IU] via SUBCUTANEOUS
  Administered 2025-01-07: 7 [IU] via SUBCUTANEOUS
  Administered 2025-01-07: 1 [IU] via SUBCUTANEOUS
  Administered 2025-01-08: 5 [IU] via SUBCUTANEOUS
  Administered 2025-01-08: 7 [IU] via SUBCUTANEOUS
  Administered 2025-01-08 – 2025-01-09 (×2): 9 [IU] via SUBCUTANEOUS
  Administered 2025-01-09: 3 [IU] via SUBCUTANEOUS
  Administered 2025-01-09: 2 [IU] via SUBCUTANEOUS
  Administered 2025-01-10: 9 [IU] via SUBCUTANEOUS
  Administered 2025-01-10: 5 [IU] via SUBCUTANEOUS
  Administered 2025-01-11: 7 [IU] via SUBCUTANEOUS
  Administered 2025-01-11 (×2): 2 [IU] via SUBCUTANEOUS
  Administered 2025-01-12: 5 [IU] via SUBCUTANEOUS
  Administered 2025-01-12: 7 [IU] via SUBCUTANEOUS
  Filled 2025-01-04: qty 2
  Filled 2025-01-04 (×2): qty 7
  Filled 2025-01-04: qty 3
  Filled 2025-01-04: qty 5
  Filled 2025-01-04: qty 1
  Filled 2025-01-04 (×3): qty 7
  Filled 2025-01-04: qty 9
  Filled 2025-01-04 (×2): qty 2
  Filled 2025-01-04: qty 7
  Filled 2025-01-04: qty 4
  Filled 2025-01-04: qty 2
  Filled 2025-01-04 (×2): qty 5
  Filled 2025-01-04: qty 9
  Filled 2025-01-04: qty 8
  Filled 2025-01-04: qty 9

## 2025-01-04 MED ORDER — LACTATED RINGERS IV BOLUS
500.0000 mL | INTRAVENOUS | Status: AC
  Administered 2025-01-04: 500 mL via INTRAVENOUS

## 2025-01-04 MED ORDER — INSULIN ASPART 100 UNIT/ML IJ SOLN: 0.0000 [IU] | Freq: Every day | INTRAMUSCULAR | Status: DC

## 2025-01-04 MED ORDER — AZITHROMYCIN 500 MG PO TABS
500.0000 mg | ORAL_TABLET | Freq: Every day | ORAL | Status: AC
  Administered 2025-01-04 – 2025-01-06 (×3): 500 mg via ORAL
  Filled 2025-01-04 (×3): qty 1

## 2025-01-04 MED ORDER — INSULIN GLARGINE 100 UNIT/ML ~~LOC~~ SOLN: 5.0000 [IU] | Freq: Every day | SUBCUTANEOUS | Status: DC

## 2025-01-04 MED ORDER — INSULIN ASPART 100 UNIT/ML IJ SOLN
0.0000 [IU] | Freq: Every day | INTRAMUSCULAR | Status: DC
  Administered 2025-01-04: 5 [IU] via SUBCUTANEOUS
  Administered 2025-01-05: 4 [IU] via SUBCUTANEOUS
  Administered 2025-01-09: 2 [IU] via SUBCUTANEOUS
  Filled 2025-01-04: qty 5
  Filled 2025-01-04: qty 2
  Filled 2025-01-04: qty 5

## 2025-01-04 MED ORDER — AZITHROMYCIN 500 MG PO TABS: 500.0000 mg | ORAL_TABLET | Freq: Every day | ORAL | Status: DC

## 2025-01-04 MED ORDER — INSULIN ASPART 100 UNIT/ML IJ SOLN
3.0000 [IU] | Freq: Once | INTRAMUSCULAR | Status: AC
  Administered 2025-01-04: 3 [IU] via SUBCUTANEOUS
  Filled 2025-01-04: qty 3

## 2025-01-04 NOTE — Progress Notes (Incomplete)
"   Patient's blood glucose is 431 and there is no insulin  coverage available. LIP has been informed. "

## 2025-01-04 NOTE — Evaluation (Signed)
 Physical Therapy Evaluation Patient Details Name: Andre Stout MRN: [id number] DOB: Mar 26, 1940 Today's Date: 01/04/2025  History of Present Illness  85 y.o. male admitted from SNF on 01/03/24 with hypoglycemia, concern for potential PNA. Of note, recent admission 10/2024-11/2024 with sepsis, PNA; worsening functional status since then per chart. Other PMH includes advanced dementia, DM2, CKD 3A, HFrEF, CVA, glaucoma, prostate CA.   Clinical Impression  Pt presents with an overall decrease in functional mobility secondary to above. Pt poor historian; per chart, admitted from SNF; prior to admission 10/2024, pt living with family and ambulating with RW. Today, pt able to stand and take steps with RW and modA, notable BLE instability; pt tolerates prolonged sitting EOB activity with supervision-CGA. Pt limited by generalized weakness, decreased activity tolerance, poor balance strategies, impaired cognition. Pt would benefit from continued post-acute rehab services (< 3 hrs/day) to maximize functional mobility and independence. Will follow acutely to address established goals.       If plan is discharge home, recommend the following: A lot of help with walking and/or transfers;A lot of help with bathing/dressing/bathroom;Assistance with cooking/housework;Assist for transportation;Help with stairs or ramp for entrance;Direct supervision/assist for medications management;Direct supervision/assist for financial management   Can travel by private vehicle   No    Equipment Recommendations  (TBD - if home, potential walker, wheelchair, BSC)  Recommendations for Other Services       Functional Status Assessment Patient has had a recent decline in their functional status and demonstrates the ability to make significant improvements in function in a reasonable and predictable amount of time.     Precautions / Restrictions Precautions Precautions: Fall;Other (comment) Recall of  Precautions/Restrictions: Impaired Precaution/Restrictions Comments: sacral wound Restrictions Weight Bearing Restrictions Per Provider Order: No      Mobility  Bed Mobility Overal bed mobility: Needs Assistance Bed Mobility: Supine to Sit, Sit to Supine     Supine to sit: Mod assist Sit to supine: Min assist   General bed mobility comments: modA for HHA to elevate trunk and scoot hips to EOB; minA for BLE management return to supine    Transfers Overall transfer level: Needs assistance Equipment used: Rolling walker (2 wheels) Transfers: Sit to/from Stand Sit to Stand: Min assist           General transfer comment: minA for trunk elevation and stability; poor eccentric control to sitting    Ambulation/Gait Ambulation/Gait assistance: Mod assist           Pre-gait activities: side steps (~2') towards HOB with RW and modA for stability, notable bilateral knee instability and pt sitting himself back down    Stairs            Wheelchair Mobility     Tilt Bed    Modified Rankin (Stroke Patients Only)       Balance Overall balance assessment: Needs assistance Sitting-balance support: No upper extremity supported Sitting balance-Leahy Scale: Fair Sitting balance - Comments: assist to adjust bilateral socks, tolerates prolonged sitting EOB to eat lunch tray (assist for set up)   Standing balance support: Bilateral upper extremity supported, During functional activity, Reliant on assistive device for balance Standing balance-Leahy Scale: Poor Standing balance comment: reliant on UE support and external assist                             Pertinent Vitals/Pain Pain Assessment Pain Assessment: No/denies pain    Home Living Family/patient expects to be  discharged to:: Skilled nursing facility                   Additional Comments: per chart, admitted from [facility]    Prior Function Prior Level of Function : Patient poor  historian/Family not available             Mobility Comments: pt poor historian, reports living with sister and indep mobility without DME. per chart, prior to admission 10/2024, pt ambulating with RW and lives with family       Extremity/Trunk Assessment   Upper Extremity Assessment Upper Extremity Assessment: Generalized weakness;Difficult to assess due to impaired cognition    Lower Extremity Assessment Lower Extremity Assessment: Generalized weakness;Difficult to assess due to impaired cognition       Communication   Communication Communication: Impaired Factors Affecting Communication: Hearing impaired    Cognition Arousal: Alert Behavior During Therapy: Flat affect   PT - Cognitive impairments: History of cognitive impairments                       PT - Cognition Comments: per chart, h/o advanced dementia. pt following simple commands with increased cues, decreased attention requiring intermittent redirection; difficult to reason with regarding activity progression (i.e. walking away from bed). oriented to self and Andre Stout, last holiday was [place] Following commands: Impaired Following commands impaired: Follows one step commands with increased time     Cueing Cueing Techniques: Verbal cues     General Comments      Exercises     Assessment/Plan    PT Assessment Patient needs continued PT services  PT Problem List Decreased strength;Decreased activity tolerance;Decreased balance;Decreased mobility;Decreased cognition;Decreased knowledge of use of DME;Decreased safety awareness;Decreased knowledge of precautions;Decreased skin integrity       PT Treatment Interventions DME instruction;Gait training;Functional mobility training;Therapeutic activities;Therapeutic exercise;Balance training;Patient/family education    PT Goals (Current goals can be found in the Care Plan section)  Acute Rehab PT Goals Patient Stated Goal: none stated PT  Goal Formulation: With patient Time For Goal Achievement: 01/18/25 Potential to Achieve Goals: Fair    Frequency Min 1X/week     Co-evaluation               AM-PAC PT 6 Clicks Mobility  Outcome Measure Help needed turning from your back to your side while in a flat bed without using bedrails?: A Little Help needed moving from lying on your back to sitting on the side of a flat bed without using bedrails?: A Little Help needed moving to and from a bed to a chair (including a wheelchair)?: A Lot Help needed standing up from a chair using your arms (e.g., wheelchair or bedside chair)?: A Little Help needed to walk in hospital room?: Total Help needed climbing 3-5 steps with a railing? : Total 6 Click Score: 13    End of Session Equipment Utilized During Treatment: Gait belt Activity Tolerance: Patient tolerated treatment well Patient left: in bed;with call bell/phone within reach;with bed alarm set Nurse Communication: Mobility status;Other (comment) (potential need for softer diet since pt does not have dentures) PT Visit Diagnosis: Other abnormalities of gait and mobility (R26.89);Muscle weakness (generalized) (M62.81)    Time: 8564-8487 PT Time Calculation (min) (ACUTE ONLY): 37 min   Charges:   PT Evaluation $PT Eval Moderate Complexity: 1 Mod PT Treatments $Therapeutic Activity: 8-22 mins PT General Charges $$ ACUTE PT VISIT: 1 Visit        Darice Almas, PT,  DPT Acute Rehabilitation Services  Personal: Secure Chat Rehab Office: [phone]  Darice LITTIE Almas 01/04/2025, 4:24 PM

## 2025-01-04 NOTE — Progress Notes (Signed)
 [facility]  PROGRESS NOTE  JASHUA KNAAK FMW:[id number] DOB: 05-11-1940 DOA: 01/02/2025 PCP: Pcp, No   Brief HPI:   85 y.o. male with medical history significant for type 2 diabetes mellitus, advanced dementia, CKD 3A with baseline creatinine 1.3-1.7 who is admitted to [facility] on 01/02/2025 with refractory hypoglycemia after presenting from SNF to [facility] ED complaining of refractory hypoglycemia.     Assessment/Plan:   Hypoglycemia -Resolved - Likely in setting of poor p.o. intake - A1c 8.5, basal insulin  was discontinued a few weeks ago. - Has been getting sliding scale insulin  with NovoLog  with metformin  - CBG is now elevated after stopping insulin  - Will restart Lantus  at low-dose of 5 units subcu daily, change sliding scale to NovoLog  sensitive sliding scale  Leukocytosis -Resolved; he is afebrile -He was started on antibiotics on 01/02/2025 -Chest x-ray showed new opacity in the left mid/lower lung atelectasis or infiltrate -UA is clear  ?  Pneumonia -Chest x-ray showed new opacity in the left mid/lower lung, atelectasis or infiltrate -Started on  ceftriaxone  and Zithromax  - Follow-up blood culture results  Acute kidney injury on CKD stage IIIa - Insetting of poor p.o. 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% on echo from 10/2024 Hold diuretic in setting of aki above   Decubitus Ulcer Appreciate WOC c/s   Hypomagnesemia Replete  B12 deficiency -B12 less than 150 Supplement   Dyslipidemia Statin on hold   GERD Continue pantoprazole   Goals of care - Palliative care consulted - Patient is DNR/DNI - No feeding tubes    DVT prophylaxis: SCDs  Medications     collagenase    Topical Daily   vitamin B-12  1,000 mcg Oral  Daily   insulin  aspart  0-5 Units Subcutaneous QHS   insulin  aspart  0-6 Units Subcutaneous TID WC   insulin  glargine  5 Units Subcutaneous Daily   pantoprazole   40 mg Oral Daily   thiamine   100 mg Oral Daily     Data Reviewed:   CBG:  Recent Labs  Lab 01/03/25 1755 01/03/25 2029 01/04/25 0010 01/04/25 0421 01/04/25 0830  GLUCAP 270* 335* 295* 195* 160*    SpO2: 100 %    Vitals:   01/03/25 2031 01/04/25 0307 01/04/25 0319 01/04/25 0827  BP: 126/67 96/64  (!) 110/54  Pulse: 73 64  69  Resp: 20 17  18   Temp: 97.6 F (36.4 C) 98.2 F (36.8 C)  98 F (36.7 C)  TempSrc: Oral Oral  Oral  SpO2: 100% 100%  100%  Weight:   55.1 kg       Data Reviewed:  Basic Metabolic Panel: Recent Labs  Lab 01/01/25 1621 01/02/25 0212 01/03/25 0500  NA 131* 132* 132*  K 5.4* 5.0 4.4  CL 94* 94* 96*  CO2 27 29 25   GLUCOSE 160* 166* 144*  BUN 39* 40* 28*  CREATININE 1.75* 1.63* 1.22  CALCIUM  10.1 10.3 9.9  MG  --  1.6* 1.8  PHOS  --   --  2.9    CBC: Recent Labs  Lab 01/01/25 1621 01/02/25 0212 01/03/25 0500  WBC 19.0* 15.5* 7.0  NEUTROABS 15.6* 12.8* 3.9  HGB 12.3* 12.3* 12.3*  HCT 38.4* 37.5* 38.6*  MCV 81.9 81.3 81.8  PLT 204 190 188  LFT Recent Labs  Lab 01/02/25 0212 01/03/25 0500  AST 16 25  ALT 7 9  ALKPHOS 73 75  BILITOT 1.2 1.3*  PROT 6.9 6.7  ALBUMIN  3.8 3.5     Antibiotics: Anti-infectives (From admission, onward)    Start     Dose/Rate Route Frequency Ordered Stop   01/02/25 1415  cefTRIAXone  (ROCEPHIN ) 2 g in sodium chloride  0.9 % 100 mL IVPB        2 g 200 mL/hr over 30 Minutes Intravenous Every 24 hours 01/02/25 1402 01/07/25 1414   01/02/25 1415  azithromycin  (ZITHROMAX ) 500 mg in sodium chloride  0.9 % 250 mL IVPB        500 mg 250 mL/hr over 60 Minutes Intravenous Every 24 hours 01/02/25 1402 01/07/25 1414        CONSULTS   Code Status: DNR  Family Communication: No family at bedside     Subjective    Denies  any complaints.  Hypoglycemia has resolved.  Eating breakfast in bed  Objective    Physical Examination:  Appears in no acute distress S1-S2, regular Lungs clear to auscultation bilaterally Abdomen is soft, nontender Alert, oriented to self and place only      Wound 11/25/24 1300 Pressure Injury Sacrum Medial Unstageable - Full thickness tissue loss in which the base of the injury is covered by slough (yellow, tan, gray, green or brown) and/or eschar (tan, brown or black) in the wound bed. (Active)     Wound Pressure Injury Scrotum Medial Stage 2 -  Partial thickness loss of dermis presenting as a shallow open injury with a red, pink wound bed without slough. (Active)        [name] [name]   [facility] If 7PM-7AM, please contact night-coverage at [email or web address], Office  [phone]   01/04/2025, 9:06 AM  LOS: 1 day

## 2025-01-04 NOTE — Progress Notes (Signed)
" ° °  Palliative Medicine Inpatient Follow Up Note HPI: 85 y.o. male  with past medical history of advanced dementia, type 2 diabetes mellitus, CKD 3a with baseline creatinine 1.3-1.7, complete heart block, and prostate cancer admitted on 01/02/2025 with refractory hypoglycemia.    Patient has had four inpatient hospitalizations over the last six months. Patient is familiar to palliative care team.  Patient faces treatment option decisions, advanced directive decisions, and anticipatory care needs. PMT consulted for goals of care.   Today's Discussion 01/04/2025  *Please note that this is a verbal dictation therefore any spelling or grammatical errors are due to the Dragon Medical One system interpretation.  I reviewed the chart notes including nursing notes from today, progress notes from today. I also reviewed vital signs, nursing flowsheets, medication administrations record, labs, and imaging.    I met with Sky this morning. He is awake and alert to self. He shares that he is not having any pain, shortness of breath, or nausea.  I spoke to patients daughter, Michaelle Amble. Created space and opportunity for  Michaelle Amble to explore thoughts feelings and fears regarding Eman's current medical situation. She shares that she feels she has a good understanding of his current disease burden. She feels he was doing well at the rehabilitation facility and is hopeful with treatment of his infection that he will continue to do well. She understands the concerns associated with patients blood sugar levels, pneumonia, and pressure injury.   Goals at this time remain to continue present measures with the hope(s) of improvement.   Questions and concerns addressed/Palliative Support Provided.   Objective Assessment: Vital Signs Vitals:   01/04/25 0827 01/04/25 1235  BP: (!) 110/54 (!) 118/53  Pulse: 69 66  Resp: 18   Temp: 98 F (36.7 C)   SpO2: 100% 100%    Intake/Output Summary (Last 24 hours) at  01/04/2025 1418 Last data filed at 01/04/2025 0456 Gross per 24 hour  Intake 984.97 ml  Output --  Net 984.97 ml   Last Weight  Most recent update: 01/04/2025  3:19 AM    Weight  55.1 kg (121 lb 7.6 oz)            Gen:  Frail elderly Caucasian M chronically ill appearing HEENT: moist mucous membranes CV: Regular rate and rhythm  PULM:  On RA, breathing is even and nonlabored ABD: soft/nontender  EXT: No edema  Neuro: Alert and oriented x1  SUMMARY OF RECOMMENDATIONS   DNAR/DNI   Continue present care allowing time for outcomes    OP Palliative care on discharge  Emotional support provided   The PMT will remain involved as needed ______________________________________________________________________________________ [name] [place] Palliative Medicine Team Team Cell Phone: [phone] Please utilize secure chat with additional questions, if there is no response within 30 minutes please call the above phone number  Time Spent: 35  Palliative Medicine Team providers are available by phone from 7am to 7pm daily and can be reached through the team cell phone.  Should this patient require assistance outside of these hours, please call the patient's attending physician.     "

## 2025-01-05 ENCOUNTER — Other Ambulatory Visit: Payer: Self-pay

## 2025-01-05 LAB — GLUCOSE, CAPILLARY
Glucose-Capillary: 157 mg/dL — ABNORMAL HIGH (ref 70–99)
Glucose-Capillary: 241 mg/dL — ABNORMAL HIGH (ref 70–99)
Glucose-Capillary: 172 mg/dL — ABNORMAL HIGH (ref 70–99)
Glucose-Capillary: 316 mg/dL — ABNORMAL HIGH (ref 70–99)

## 2025-01-05 NOTE — Plan of Care (Signed)

## 2025-01-05 NOTE — Evaluation (Signed)
 Occupational Therapy Evaluation Patient Details Name: Andre Stout MRN: [id number] DOB: 06-Feb-1940 Today's Date: 01/05/2025   History of Present Illness   85 y.o. male admitted from SNF on 01/03/24 with hypoglycemia, concern for potential PNA. Of note, recent admission 10/2024-11/2024 with sepsis, PNA; worsening functional status since then per chart. Other PMH includes advanced dementia, DM2, CKD 3A, HFrEF, CVA, glaucoma, prostate CA.     Clinical Impressions Pt is from SNF, but was mod I with family as of Nov 2025 with DME. Pt known to therapist from previous admission, and note decline in cognition and overall strength/balance. Today Pt was able to complete bed mobility at mod A, BSC transfer at mod A with RW, once on [facility] participated in grooming tasks with A for initiation and task completion, and OT washed his back. Pt mod A for small pivotal steps back (this time face to gace without RW) and mod A to return supine. At this time recommend post-acute OT at rehab of <3 hours daily with continued skilled OT at this time to maximize safety and independence in ADL and functional transfers. Next session potentially OOB with chair follow to sink to utilize mirror for standing ADL     If plan is discharge home, recommend the following:   A lot of help with walking and/or transfers;A lot of help with bathing/dressing/bathroom;Direct supervision/assist for medications management;Direct supervision/assist for financial management     Functional Status Assessment   Patient has had a recent decline in their functional status and/or demonstrates limited ability to make significant improvements in function in a reasonable and predictable amount of time     Equipment Recommendations   Other (comment) (defer to next venue of care)     Recommendations for Other Services   PT consult     Precautions/Restrictions   Precautions Precautions: Fall;Other (comment) Recall of  Precautions/Restrictions: Impaired Precaution/Restrictions Comments: sacral wound Restrictions Weight Bearing Restrictions Per Provider Order: No     Mobility Bed Mobility Overal bed mobility: Needs Assistance Bed Mobility: Supine to Sit, Sit to Supine     Supine to sit: Mod assist (trunk elevation, use of bed pad to faciliate hips) Sit to supine: Mod assist (BLE back into bed)   General bed mobility comments: increased time for processing and encouragement    Transfers Overall transfer level: Needs assistance Equipment used: Rolling walker (2 wheels) Transfers: Sit to/from Stand Sit to Stand: Mod assist           General transfer comment: vc for safe hand placement, mod A for boost and balance      Balance Overall balance assessment: Needs assistance Sitting-balance support: No upper extremity supported Sitting balance-Leahy Scale: Fair Sitting balance - Comments: unchallenged   Standing balance support: Bilateral upper extremity supported, During functional activity, Reliant on assistive device for balance Standing balance-Leahy Scale: Poor Standing balance comment: reliant on UE support and external assist                           ADL either performed or assessed with clinical judgement   ADL Overall ADL's : Needs assistance/impaired Eating/Feeding: Set up   Grooming: Minimal assistance;Wash/dry face;Wash/dry hands;Sitting Grooming Details (indicate cue type and reason): EOB, GCA balance, cues for initiating tasks Upper Body Bathing: Moderate assistance Upper Body Bathing Details (indicate cue type and reason): for back Lower Body Bathing: Maximal assistance   Upper Body Dressing : Moderate assistance   Lower Body Dressing: Maximal assistance  Toilet Transfer: Moderate assistance;Stand-pivot;BSC/3in1 Statistician Details (indicate cue type and reason): BSC transfer Toileting- Clothing Manipulation and Hygiene: Maximal assistance        Functional mobility during ADLs: Moderate assistance;Maximal assistance;Rolling walker (2 wheels);Cueing for safety;Cueing for sequencing General ADL Comments: decreased strength, balance, cognition     Vision Baseline Vision/History: 3 Glaucoma Ability to See in Adequate Light: 0 Adequate Patient Visual Report: No change from baseline       Perception         Praxis         Pertinent Vitals/Pain Pain Assessment Pain Assessment: No/denies pain     Extremity/Trunk Assessment Upper Extremity Assessment Upper Extremity Assessment: Generalized weakness   Lower Extremity Assessment Lower Extremity Assessment: Generalized weakness       Communication Communication Communication: Impaired Factors Affecting Communication: Hearing impaired   Cognition Arousal: Alert Behavior During Therapy: Flat affect Cognition: No family/caregiver present to determine baseline             OT - Cognition Comments: Pt known to therapist from previous admission. Cognition is more impaired since this therapists last contact with patient                 Following commands: Impaired Following commands impaired: Follows one step commands with increased time     Cueing  General Comments   Cueing Techniques: Verbal cues      Exercises     Shoulder Instructions      Home Living Family/patient expects to be discharged to:: Skilled nursing facility                                 Additional Comments: per chart, admitted from [name]      Prior Functioning/Environment Prior Level of Function : Patient poor historian/Family not available             Mobility Comments: pt poor historian, reports living with sister and indep mobility without DME. per chart, prior to admission 10/2024, pt ambulating with RW and lives with family ADLs Comments: likely needs help at baseline from [facility] staff    OT Problem List: Decreased strength;Decreased activity  tolerance;Impaired balance (sitting and/or standing);Decreased cognition;Decreased safety awareness   OT Treatment/Interventions: Self-care/ADL training;Energy conservation;DME and/or AE instruction;Therapeutic activities;Patient/family education;Balance training      OT Goals(Current goals can be found in the care plan section)   Acute Rehab OT Goals Patient Stated Goal: none stated OT Goal Formulation: With patient Time For Goal Achievement: 01/19/25 Potential to Achieve Goals: Fair   OT Frequency:  Min 2X/week    Co-evaluation              AM-PAC OT 6 Clicks Daily Activity     Outcome Measure Help from another person eating meals?: A Little Help from another person taking care of personal grooming?: A Little Help from another person toileting, which includes using toliet, bedpan, or urinal?: A Lot Help from another person bathing (including washing, rinsing, drying)?: A Lot Help from another person to put on and taking off regular upper body clothing?: A Lot Help from another person to put on and taking off regular lower body clothing?: A Lot 6 Click Score: 14   End of Session Equipment Utilized During Treatment: Gait belt;Rolling walker (2 wheels) Nurse Communication: Mobility status;Precautions  Activity Tolerance: Patient tolerated treatment well Patient left: in bed;with bed alarm set;with call bell/phone within reach;Other (comment) (  all 4 rails up)  OT Visit Diagnosis: Unsteadiness on feet (R26.81);Other abnormalities of gait and mobility (R26.89);Muscle weakness (generalized) (M62.81);Other symptoms and signs involving the nervous system (R29.898);Adult, failure to thrive (R62.7)                Time: 8976-8950 OT Time Calculation (min): 26 min Charges:  OT General Charges $OT Visit: 1 Visit OT Evaluation $OT Eval Moderate Complexity: 1 Mod OT Treatments $Self Care/Home Management : 8-22 mins  Leita DEL OTR/L Acute Rehabilitation Services Office:  [phone]   Leita PARAS [facility] 01/05/2025, 2:03 PM

## 2025-01-05 NOTE — TOC Initial Note (Addendum)
 Transition of Care [facility]) - Initial/Assessment Note    Patient Details  Name: Andre Stout MRN: [id number] Date of Birth: December 15, 1940  [facility] CM/SW Contact:    Sherline Clack, LCSWA Phone Number: 01/05/2025, 9:12 AM  Clinical Narrative:                  Update 4:21 PM: CSW spoke with daughter and she requested placement in [place], close to home. CSW sent out patient's information to facilities in [place] and told daughter CSW would reach out tomorrow morning to get choice. CSW explained insurance process and daughter explained understanding. CSW will follow up tomorrow.   CSW discussed anticipated discharge plan back to [name] with daughter. She is agreeable to patient returning to [name] for rehab. CSW reached out to facility and was told patient's family had not held his bed. Per facility, they do not anticipate a bed opening this week. CSW will let daughter know.    Barriers to Discharge: Insurance Authorization   Patient Goals and CMS Choice     Choice offered to / list presented to : Patient, Adult Children      Expected Discharge Plan and Services       Living arrangements for the past 2 months: Single Family Home                                      Prior Living Arrangements/Services Living arrangements for the past 2 months: Single Family Home Lives with:: Adult Children Patient language and need for interpreter reviewed:: Yes                 Activities of Daily Living   ADL Screening (condition at time of admission) Independently performs ADLs?: No Does the patient have a NEW difficulty with bathing/dressing/toileting/self-feeding that is expected to last >3 days?: No Does the patient have a NEW difficulty with getting in/out of bed, walking, or climbing stairs that is expected to last >3 days?: No Does the patient have a NEW difficulty with communication that is expected to last >3 days?:  No Is the patient deaf or have difficulty hearing?: No Does the patient have difficulty seeing, even when wearing glasses/contacts?: No Does the patient have difficulty concentrating, remembering, or making decisions?: Yes  Permission Sought/Granted                  Emotional Assessment Appearance:: Appears stated age Attitude/Demeanor/Rapport: Unable to Assess Affect (typically observed): Unable to Assess Orientation: : Oriented to Self      Admission diagnosis:  Hypoglycemia [E16.2] Patient Active Problem List   Diagnosis Date Noted   Hypoglycemia 01/02/2025   Multifocal pneumonia 11/21/2024   Cognitive impairment 11/21/2024   Non-ST elevation (NSTEMI) myocardial infarction (HCC) 11/02/2024   CHF (congestive heart failure) (HCC) 11/02/2024   ARF (acute renal failure) 11/01/2024   Elevated troponin 11/01/2024   Diarrhea 11/01/2024   DM2 (diabetes mellitus, type 2) (HCC) 11/01/2024   Pleural effusion due to CHF (congestive heart failure) (HCC) 11/01/2024   Pneumonia 09/02/2024   Nausea and vomiting 09/02/2024   Uncontrolled diabetes mellitus with hypoglycemia (HCC) 09/02/2024   Acute metabolic encephalopathy 09/02/2024   Hyperkalemia 09/02/2024   Microcytic anemia 09/02/2024   Moderate malnutrition 07/25/2024   L1 vertebral fracture (HCC) 07/22/2024   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   Sepsis due to pneumonia (HCC) 07/21/2024   Aspiration pneumonia (  HCC) 07/21/2024   Acute respiratory failure with hypoxia (HCC) 07/21/2024   CKD stage 3a, GFR 45-59 ml/min (HCC) 07/21/2024   Subdural hematoma (HCC) 07/21/2024   Irritant contact dermatitis associated with stoma 03/13/2024   Persistent postoperative fistula 03/13/2024   GERD without esophagitis 03/05/2024   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   History of complete heart block 03/05/2024   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   Malnutrition of moderate degree 01/20/2024   Hyperlipidemia 01/17/2024    Leukocytosis 01/17/2024   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 (HCC) 01/17/2024   Acute encephalopathy 01/17/2024   fracture of distal diaphyseal metaphyseal junction of the 5th left metacarpal with dorsal angulation 01/17/2024   Cholecystostomy care (HCC) 01/17/2024   Migration of percutaneous cholecystostomy tube 08/18/2023   Hypertension associated with diabetes (HCC) 08/18/2023   History of stroke 08/18/2023   Mixed diabetic hyperlipidemia associated with type 2 diabetes mellitus (HCC) 08/18/2023   TIA (transient ischemic attack) 10/15/2022   Odynophagia 08/21/2022   Hyponatremia 08/20/2022   Obesity (BMI 30-39.9) 08/20/2022   Myocardial injury 08/20/2022   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   Seizure (HCC) 01/02/2022   Prolonged QT interval 01/02/2022   Syncope and collapse 10/09/2020   Prostate cancer (HCC) 03/25/2019   Hypophosphatemia 03/06/2016   Hypomagnesemia 03/06/2016   Benign essential HTN 03/05/2016   AKI (acute kidney injury) 03/04/2016   PCP:  Freddrick, No Pharmacy:   [facility] [place], [place] - [address] [place] [address] Phone: [phone] Fax: [phone]     Social Drivers of Health (SDOH) Social History: SDOH Screenings   Food Insecurity: No Food Insecurity (01/03/2025)  Housing: Low Risk (01/03/2025)  Transportation Needs: No Transportation Needs (01/03/2025)  Utilities: Not At Risk (01/03/2025)  Alcohol Screen: Low Risk (07/24/2023)  Depression (PHQ2-9): Low Risk (07/24/2023)  Financial Resource Strain: Low Risk (10/26/2024)  Physical Activity: Inactive (10/26/2024)  Social Connections: Unknown (01/03/2025)  Recent Concern: Social Connections - Socially Isolated (10/26/2024)  Stress: No Stress Concern Present (10/26/2024)  Tobacco Use: Medium Risk (01/02/2025)  Health Literacy: Adequate Health Literacy (07/24/2023)   SDOH Interventions:     Readmission Risk Interventions     11/06/2024   10:49 AM 08/15/2022    9:25 AM  Readmission Risk Prevention Plan  Transportation Screening Complete Complete  PCP or Specialist Appt within 5-7 Days  Complete  Home Care Screening  Complete  Medication Review (RN CM)  Complete  Medication Review (RN Care Manager) Complete   PCP or Specialist appointment within 3-5 days of discharge Complete   HRI or Home Care Consult Complete   Palliative Care Screening Not Applicable   Skilled Nursing Facility Not Applicable

## 2025-01-05 NOTE — NC FL2 (Signed)
 " [place]  MEDICAID FL2 LEVEL OF CARE FORM     IDENTIFICATION  Patient Name: Andre Stout Birthdate: Aug 24, 1940 Sex: male Admission Date (Current Location): 01/02/2025  [facility] and [place] Number:  Producer, Television/film/video and Address:  [facility] [place], [address], [place], [place] [address]      Provider Number: [id number]  Attending Physician Name and Address:  Drusilla Sabas RAMAN, MD  Relative Name and Phone Number:  Michaelle Idell Sayres: [phone]    Current Level of Care: Hospital Recommended Level of Care: Skilled Nursing Facility Prior Approval Number:    Date Approved/Denied:   PASRR Number: [id number]  Discharge Plan: SNF    Current Diagnoses: Patient Active Problem List   Diagnosis Date Noted   Hypoglycemia 01/02/2025   Multifocal pneumonia 11/21/2024   Cognitive impairment 11/21/2024   Non-ST elevation (NSTEMI) myocardial infarction (HCC) 11/02/2024   CHF (congestive heart failure) (HCC) 11/02/2024   ARF (acute renal failure) 11/01/2024   Elevated troponin 11/01/2024   Diarrhea 11/01/2024   DM2 (diabetes mellitus, type 2) (HCC) 11/01/2024   Pleural effusion due to CHF (congestive heart failure) (HCC) 11/01/2024   Pneumonia 09/02/2024   Nausea and vomiting 09/02/2024   Uncontrolled diabetes mellitus with hypoglycemia (HCC) 09/02/2024   Acute metabolic encephalopathy 09/02/2024   Hyperkalemia 09/02/2024   Microcytic anemia 09/02/2024   Moderate malnutrition 07/25/2024   L1 vertebral fracture (HCC) 07/22/2024   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   Sepsis due to pneumonia (HCC) 07/21/2024   Aspiration pneumonia (HCC) 07/21/2024   Acute respiratory failure with hypoxia (HCC) 07/21/2024   CKD stage 3a, GFR 45-59 ml/min (HCC) 07/21/2024   Subdural hematoma (HCC) 07/21/2024   Irritant contact dermatitis associated with stoma 03/13/2024   Persistent postoperative fistula 03/13/2024   GERD without esophagitis 03/05/2024   Colocutaneous fistula  03/05/2024   History of complete heart block 03/05/2024   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   Malnutrition of moderate degree 01/20/2024   Hyperlipidemia 01/17/2024   Leukocytosis 01/17/2024   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 (HCC) 01/17/2024   Acute encephalopathy 01/17/2024   fracture of distal diaphyseal metaphyseal junction of the 5th left metacarpal with dorsal angulation 01/17/2024   Cholecystostomy care (HCC) 01/17/2024   Migration of percutaneous cholecystostomy tube 08/18/2023   Hypertension associated with diabetes (HCC) 08/18/2023   History of stroke 08/18/2023   Mixed diabetic hyperlipidemia associated with type 2 diabetes mellitus (HCC) 08/18/2023   TIA (transient ischemic attack) 10/15/2022   Odynophagia 08/21/2022   Hyponatremia 08/20/2022   Obesity (BMI 30-39.9) 08/20/2022   Myocardial injury 08/20/2022   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   Seizure (HCC) 01/02/2022   Prolonged QT interval 01/02/2022   Syncope and collapse 10/09/2020   Prostate cancer (HCC) 03/25/2019   Hypophosphatemia 03/06/2016   Hypomagnesemia 03/06/2016   Benign essential HTN 03/05/2016   AKI (acute kidney injury) 03/04/2016    Orientation RESPIRATION BLADDER Height & Weight     Self  Normal Continent Weight: (S) 130 lb 4.7 oz (59.1 kg) (I used the bed weight, reweighed twice) Height:     BEHAVIORAL SYMPTOMS/MOOD NEUROLOGICAL BOWEL NUTRITION STATUS      Continent Diet (please refer to dc summary)  AMBULATORY STATUS COMMUNICATION OF NEEDS Skin   Limited Assist Verbally Normal                       Personal Care Assistance Level of Assistance  Bathing,  Feeding, Dressing Bathing Assistance: Limited assistance Feeding assistance: Independent Dressing Assistance: Limited assistance     Functional Limitations Info  Sight, Hearing, Speech Sight Info: Adequate Hearing Info: Adequate Speech Info: Adequate    SPECIAL  CARE FACTORS FREQUENCY  PT (By licensed PT), OT (By licensed OT)     PT Frequency: 5x/week OT Frequency: 5x/week            Contractures Contractures Info: Not present    Additional Factors Info  Code Status, Allergies Code Status Info: DNR Allergies Info: NKA           Current Medications (01/05/2025):  This is the current hospital active medication list Current Facility-Administered Medications  Medication Dose Route Frequency Provider Last Rate Last Admin   acetaminophen  (TYLENOL ) tablet 650 mg  650 mg Oral Q6H PRN Howerter, Justin B, DO       Or   acetaminophen  (TYLENOL ) suppository 650 mg  650 mg Rectal Q6H PRN Howerter, Justin B, DO       azithromycin  (ZITHROMAX ) tablet 500 mg  500 mg Oral Daily Pierce, Dwayne A, RPH   500 mg at 01/04/25 1725   cefTRIAXone  (ROCEPHIN ) 2 g in sodium chloride  0.9 % 100 mL IVPB  2 g Intravenous Q24H Perri DELENA Meliton Mickey., MD 200 mL/hr at 01/04/25 1527 2 g at 01/04/25 1527   collagenase  (SANTYL ) ointment   Topical Daily Perri DELENA Meliton Mickey., MD   Given at 01/04/25 1250   cyanocobalamin  (VITAMIN B12) tablet 1,000 mcg  1,000 mcg Oral Daily Perri DELENA Meliton Mickey., MD   1,000 mcg at 01/04/25 0855   insulin  aspart (novoLOG ) injection 0-5 Units  0-5 Units Subcutaneous QHS Sundil, Subrina, MD   5 Units at 01/04/25 2223   insulin  aspart (novoLOG ) injection 0-9 Units  0-9 Units Subcutaneous TID WC Sundil, Subrina, MD       insulin  glargine-yfgn (SEMGLEE ) injection 5 Units  5 Units Subcutaneous Daily Lyle Kava A, RPH   5 Units at 01/04/25 1103   ondansetron  (ZOFRAN ) injection 4 mg  4 mg Intravenous Q6H PRN Howerter, Justin B, DO       pantoprazole  (PROTONIX ) EC tablet 40 mg  40 mg Oral Daily Perri DELENA Meliton Mickey., MD   40 mg at 01/04/25 9143   thiamine  (VITAMIN B1) tablet 100 mg  100 mg Oral Daily Perri DELENA Meliton Mickey., MD   100 mg at 01/04/25 9143     Discharge Medications: Please see discharge summary for a list of discharge  medications.  Relevant Imaging Results:  Relevant Lab Results:   Additional Information SSN [id number]  Sherline Clack, LCSWA     "

## 2025-01-06 DIAGNOSIS — E119 Type 2 diabetes mellitus without complications: Secondary | ICD-10-CM | POA: Diagnosis not present

## 2025-01-06 DIAGNOSIS — D72829 Elevated white blood cell count, unspecified: Secondary | ICD-10-CM | POA: Diagnosis not present

## 2025-01-06 DIAGNOSIS — E162 Hypoglycemia, unspecified: Secondary | ICD-10-CM | POA: Diagnosis not present

## 2025-01-06 LAB — GLUCOSE, CAPILLARY
Glucose-Capillary: 321 mg/dL — ABNORMAL HIGH (ref 70–99)
Glucose-Capillary: 440 mg/dL — ABNORMAL HIGH (ref 70–99)
Glucose-Capillary: 297 mg/dL — ABNORMAL HIGH (ref 70–99)
Glucose-Capillary: 164 mg/dL — ABNORMAL HIGH (ref 70–99)

## 2025-01-06 MED ORDER — INSULIN ASPART 100 UNIT/ML IJ SOLN
15.0000 [IU] | Freq: Once | INTRAMUSCULAR | Status: AC
  Administered 2025-01-06: 15 [IU] via SUBCUTANEOUS
  Filled 2025-01-06: qty 15

## 2025-01-06 MED ORDER — INSULIN GLARGINE-YFGN 100 UNIT/ML ~~LOC~~ SOLN
5.0000 [IU] | Freq: Two times a day (BID) | SUBCUTANEOUS | Status: DC
  Administered 2025-01-06 – 2025-01-11 (×9): 5 [IU] via SUBCUTANEOUS
  Filled 2025-01-06 (×12): qty 0.05

## 2025-01-06 MED ORDER — INSULIN ASPART 100 UNIT/ML IJ SOLN
3.0000 [IU] | Freq: Three times a day (TID) | INTRAMUSCULAR | Status: DC
  Administered 2025-01-06 – 2025-01-09 (×7): 3 [IU] via SUBCUTANEOUS
  Filled 2025-01-06 (×8): qty 3

## 2025-01-06 NOTE — Progress Notes (Signed)
 [facility]  PROGRESS NOTE  Andre Stout FMW:[id number] DOB: 1940/06/29 DOA: 01/02/2025 PCP: Pcp, No   Brief HPI:   85 y.o. male with medical history significant for type 2 diabetes mellitus, advanced dementia, CKD 3A with baseline creatinine 1.3-1.7 who is admitted to [facility] on 01/02/2025 with refractory hypoglycemia after presenting from SNF to [facility] ED complaining of refractory hypoglycemia.     Assessment/Plan:   Hypoglycemia/diabetes mellitus type 2 -Resolved - Likely in setting of poor p.o. intake - A1c 8.5, basal insulin  was discontinued a few weeks ago. - Has been getting sliding scale insulin  with NovoLog  with metformin  - CBG is now elevated after stopping insulin  -Started back on Lantus  5 units subcu twice daily, NovoLog  sensitive sliding scale insulin  - Will add NovoLog  3 units 3 times daily meal coverage   Leukocytosis -Resolved; he is afebrile -He was started on antibiotics on 01/02/2025 -Chest x-ray showed new opacity in the left mid/lower lung atelectasis or infiltrate -UA is clear  ?  Pneumonia -Chest x-ray showed new opacity in the left mid/lower lung, atelectasis or infiltrate -Started on  ceftriaxone  and Zithromax  - Follow-up blood culture results  Acute kidney injury on CKD stage IIIa - Insetting of poor p.o. 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% on echo from 10/2024 Hold diuretic in setting of aki above   Decubitus Ulcer Appreciate WOC c/s   Hypomagnesemia Replete  B12 deficiency -B12 less than 150 Supplement   Dyslipidemia Statin on hold   GERD Continue pantoprazole   Goals of care - Palliative care consulted - Patient is DNR/DNI - No feeding tubes  Disposition - Awaiting bed at skilled nursing  facility  DVT prophylaxis: SCDs  Medications     collagenase    Topical Daily   vitamin B-12  1,000 mcg Oral Daily   insulin  aspart  0-5 Units Subcutaneous QHS   insulin  aspart  0-9 Units Subcutaneous TID WC   insulin  glargine-yfgn  5 Units Subcutaneous BID   pantoprazole   40 mg Oral Daily   thiamine   100 mg Oral Daily     Data Reviewed:   CBG:  Recent Labs  Lab 01/05/25 1229 01/05/25 1612 01/05/25 2136 01/06/25 0814 01/06/25 1147  GLUCAP 241* 172* 316* 321* 440*    SpO2: 100 %    Vitals:   01/06/25 0451 01/06/25 0500 01/06/25 0815 01/06/25 1148  BP: (!) 107/56  (!) 110/55 115/61  Pulse: (!) 103  (!) 55 (!) 57  Resp:   18 18  Temp: 98.4 F (36.9 C)  98.2 F (36.8 C) 98.2 F (36.8 C)  TempSrc: Oral  Oral Oral  SpO2: 100%  100% 100%  Weight:  56.8 kg        Data Reviewed:  Basic Metabolic Panel: Recent Labs  Lab 01/01/25 1621 01/02/25 0212 01/03/25 0500  NA 131* 132* 132*  K 5.4* 5.0 4.4  CL 94* 94* 96*  CO2 27 29 25   GLUCOSE 160* 166* 144*  BUN 39* 40* 28*  CREATININE 1.75* 1.63* 1.22  CALCIUM  10.1 10.3 9.9  MG  --  1.6* 1.8  PHOS  --   --  2.9    CBC: Recent Labs  Lab 01/01/25 1621 01/02/25 0212 01/03/25 0500  WBC 19.0* 15.5* 7.0  NEUTROABS 15.6* 12.8* 3.9  HGB 12.3* 12.3* 12.3*  HCT 38.4* 37.5* 38.6*  MCV 81.9 81.3 81.8  PLT 204 190 188    LFT Recent Labs  Lab 01/02/25 0212 01/03/25 0500  AST 16 25  ALT 7 9  ALKPHOS 73 75  BILITOT 1.2 1.3*  PROT 6.9 6.7  ALBUMIN  3.8 3.5     Antibiotics: Anti-infectives (From admission, onward)    Start     Dose/Rate Route Frequency Ordered Stop   01/05/25 1000  azithromycin  (ZITHROMAX ) tablet 500 mg  Status:  Discontinued        500 mg Oral Daily 01/04/25 1529 01/04/25 1530   01/04/25 1529  azithromycin  (ZITHROMAX ) tablet 500 mg        500 mg Oral Daily 01/04/25 1530 01/06/25 0932   01/02/25 1415  cefTRIAXone  (ROCEPHIN ) 2 g in sodium chloride  0.9 % 100 mL IVPB        2 g 200  mL/hr over 30 Minutes Intravenous Every 24 hours 01/02/25 1402 01/07/25 1414   01/02/25 1415  azithromycin  (ZITHROMAX ) 500 mg in sodium chloride  0.9 % 250 mL IVPB  Status:  Discontinued        500 mg 250 mL/hr over 60 Minutes Intravenous Every 24 hours 01/02/25 1402 01/04/25 1528        CONSULTS   Code Status: DNR  Family Communication: No family at bedside     Subjective   Denies any complaints.  Good p.o. intake.   Objective    Physical Examination:  General-appears in no acute distress Heart-S1-S2, regular, no murmur auscultated Lungs-clear to auscultation bilaterally, no wheezing or crackles auscultated Abdomen-soft, nontender, no organomegaly Extremities-no edema in the lower extremities Neuro-alert, oriented x3, no focal deficit noted     Wound 11/25/24 1300 Pressure Injury Sacrum Medial Unstageable - Full thickness tissue loss in which the base of the injury is covered by slough (yellow, tan, gray, green or brown) and/or eschar (tan, brown or black) in the wound bed. (Active)     Wound Pressure Injury Scrotum Medial Stage 2 -  Partial thickness loss of dermis presenting as a shallow open injury with a red, pink wound bed without slough. (Active)        Andre Stout   [facility] If 7PM-7AM, please contact night-coverage at [email or web address], Office  [phone]   01/06/2025, 12:56 PM  LOS: 3 days

## 2025-01-06 NOTE — Plan of Care (Signed)

## 2025-01-06 NOTE — Care Management Important Message (Signed)
 Important Message  Patient Details  Name: Andre Stout MRN: [id number] Date of Birth: March 10, 1940   Important Message Given:  Yes - Medicare IM     [name] 01/06/2025, 2:11 PM

## 2025-01-06 NOTE — TOC Progression Note (Signed)
 Transition of Care [facility]) - Progression Note    Patient Details  Name: Andre Stout MRN: [id number] Date of Birth: 03/30/1940  Transition of Care [facility]) CM/SW Contact  Sherline Clack, [place] Phone Number: 01/06/2025, 4:42 PM  Clinical Narrative:     CSW called patient's daughter to discuss current bed offers for patient. CSW reviewed facilities that had declined patient ([facility], [place]) and sent out referrals to [facility] and [facility] at [employer] request. CSW will call tomorrow to get choice as patient is nearing medical readiness. CSW will continue to follow.   Anticipated Discharge Plan: Skilled Nursing Facility  Barriers to Discharge: English As A Second Language Teacher, SNF Pending Bed Offer               Expected Discharge Plan and Services       Living arrangements for the past 2 months: Single Family Home                                       Social Drivers of Health (SDOH) Interventions SDOH Screenings   Food Insecurity: No Food Insecurity (01/03/2025)  Housing: Low Risk (01/03/2025)  Transportation Needs: No Transportation Needs (01/03/2025)  Utilities: Not At Risk (01/03/2025)  Alcohol Screen: Low Risk (07/24/2023)  Depression (PHQ2-9): Low Risk (07/24/2023)  Financial Resource Strain: Low Risk (10/26/2024)  Physical Activity: Inactive (10/26/2024)  Social Connections: Unknown (01/03/2025)  Recent Concern: Social Connections - Socially Isolated (10/26/2024)  Stress: No Stress Concern Present (10/26/2024)  Tobacco Use: Medium Risk (01/02/2025)  Health Literacy: Adequate Health Literacy (07/24/2023)    Readmission Risk Interventions    11/06/2024   10:49 AM 08/15/2022    9:25 AM  Readmission Risk Prevention Plan  Transportation Screening Complete Complete  PCP or Specialist Appt within 5-7 Days  Complete  Home Care Screening  Complete  Medication Review (RN CM)  Complete  Medication Review (RN Care Manager) Complete   PCP or Specialist  appointment within 3-5 days of discharge Complete   HRI or Home Care Consult Complete   Palliative Care Screening Not Applicable   Skilled Nursing Facility Not Applicable

## 2025-01-06 NOTE — Progress Notes (Signed)
" ° °  Palliative Medicine Inpatient Follow Up Note HPI: 85 y.o. male  with past medical history of advanced dementia, type 2 diabetes mellitus, CKD 3a with baseline creatinine 1.3-1.7, complete heart block, and prostate cancer admitted on 01/02/2025 with refractory hypoglycemia. Patient has had four inpatient hospitalizations over the last six months. Patient is familiar to palliative care team. Patient faces treatment option decisions, advanced directive decisions, and anticipatory care needs. PMT consulted for goals of care.   Today's Discussion 01/06/2025  I reviewed the chart notes including nursing notes from [facility] , progress notes Dr. Drusilla, Leita Odea - OT, and Sherline Sanders. I also reviewed vital signs, nursing flowsheets, medication administrations record, labs - Glucose remains variable last capillary blood sugar was 440, and imaging - CXR 1/3 w/ LLL opacification.    I met with Andre Stout at bedside this morning. He is awake and alert. He is aware of person and place though neglects to understand why he is here. We discussed the reason for his hospitalization in the setting of his low blood sugars. We reviewed it was questionable when he first arrived if he may have had a pneumonia based upon his chest xray and his high WBC count. WE reviewed that he has been improving since hospitalization. He himself shares feeling well and denies pain, shortness of breath, or anxiety.   We reviewed the plan for skilled nursing placement in the oncoming day(s). The prior facility that [name] was at does not have a bed available.   Questions and concerns addressed/Palliative Support Provided.   Objective Assessment: Vital Signs Vitals:   01/06/25 0815 01/06/25 1148  BP: (!) 110/55 115/61  Pulse: (!) 55 (!) 57  Resp: 18 18  Temp: 98.2 F (36.8 C) 98.2 F (36.8 C)  SpO2: 100% 100%    Intake/Output Summary (Last 24 hours) at 01/06/2025 1238 Last data filed at 01/06/2025 0545 Gross per 24  hour  Intake --  Output 1000 ml  Net -1000 ml   Last Weight  Most recent update: 01/06/2025  6:00 AM    Weight  56.8 kg (125 lb 3.5 oz)            Gen:  Frail elderly Caucasian M chronically ill appearing HEENT: moist mucous membranes CV: Regular rate and rhythm  PULM:  On RA, breathing is even and nonlabored ABD: soft/nontender  EXT: No edema  Neuro: Alert and oriented x2  SUMMARY OF RECOMMENDATIONS   DNAR/DNI   Continue current care --> Allowing time for outcomes  TOC working on placement  OP Palliative care recommended, has not been set up due to recurrent readmissions   The PMT will continue to follow along as needed ______________________________________________________________________________________ [name] [place] Palliative Medicine Team Team Cell Phone: [phone] Please utilize secure chat with additional questions, if there is no response within 30 minutes please call the above phone number  I personally spent a total of 30 minutes in the care of the patient today including preparing to see the patient, counseling and educating, independently interpreting results, communicating results, and providing emotional support.      "

## 2025-01-07 DIAGNOSIS — N1831 Chronic kidney disease, stage 3a: Secondary | ICD-10-CM | POA: Diagnosis not present

## 2025-01-07 DIAGNOSIS — Z794 Long term (current) use of insulin: Secondary | ICD-10-CM | POA: Diagnosis not present

## 2025-01-07 DIAGNOSIS — E162 Hypoglycemia, unspecified: Secondary | ICD-10-CM | POA: Diagnosis not present

## 2025-01-07 DIAGNOSIS — D72829 Elevated white blood cell count, unspecified: Secondary | ICD-10-CM | POA: Diagnosis not present

## 2025-01-07 DIAGNOSIS — E119 Type 2 diabetes mellitus without complications: Secondary | ICD-10-CM | POA: Diagnosis not present

## 2025-01-07 LAB — GLUCOSE, CAPILLARY
Glucose-Capillary: 140 mg/dL — ABNORMAL HIGH (ref 70–99)
Glucose-Capillary: 339 mg/dL — ABNORMAL HIGH (ref 70–99)
Glucose-Capillary: 84 mg/dL (ref 70–99)
Glucose-Capillary: 145 mg/dL — ABNORMAL HIGH (ref 70–99)

## 2025-01-07 NOTE — Progress Notes (Signed)
 [facility]  PROGRESS NOTE  Andre Stout FMW:[id number] DOB: 1940-08-12 DOA: 01/02/2025 PCP: Pcp, No   Brief HPI:   85 y.o. male with medical history significant for type 2 diabetes mellitus, advanced dementia, CKD 3A with baseline creatinine 1.3-1.7 who is admitted to [facility] on 01/02/2025 with refractory hypoglycemia after presenting from SNF to [facility] ED complaining of refractory hypoglycemia.     Assessment/Plan:   Hypoglycemia/diabetes mellitus type 2 -Resolved - Likely in setting of poor p.o. intake - A1c 8.5, basal insulin  was discontinued a few weeks ago. - Has been getting sliding scale insulin  with NovoLog  with metformin  - CBG is now elevated after stopping insulin  -Started back on Lantus  5 units subcu twice daily, NovoLog  sensitive sliding scale insulin  - Continue NovoLog  3 units 3 times daily meal coverage - He was started on soft diet, has very good p.o. intake.  Will change to carb modified diet as blood sugar continues to be elevated   Leukocytosis -Resolved; he is afebrile -He was started on antibiotics on 01/02/2025 -Chest x-ray showed new opacity in the left mid/lower lung atelectasis or infiltrate -UA is clear  ?  Pneumonia -Chest x-ray showed new opacity in the left mid/lower lung, atelectasis or infiltrate - Completed antibiotics for 5 days  Acute kidney injury on CKD stage IIIa - Insetting of poor p.o. 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% on echo from 10/2024 Hold diuretic in setting of aki above   Decubitus Ulcer Appreciate WOC c/s   Hypomagnesemia Replete  B12 deficiency -B12 less than 150 Supplement   Dyslipidemia Statin on hold   GERD Continue pantoprazole   Goals of care - Palliative care consulted -  Patient is DNR/DNI - No feeding tubes  Disposition - Awaiting bed at skilled nursing facility  DVT prophylaxis: SCDs  Medications     collagenase    Topical Daily   vitamin B-12  1,000 mcg Oral Daily   insulin  aspart  0-5 Units Subcutaneous QHS   insulin  aspart  0-9 Units Subcutaneous TID WC   insulin  aspart  3 Units Subcutaneous TID WC   insulin  glargine-yfgn  5 Units Subcutaneous BID   pantoprazole   40 mg Oral Daily   thiamine   100 mg Oral Daily     Data Reviewed:   CBG:  Recent Labs  Lab 01/06/25 1147 01/06/25 1617 01/06/25 2052 01/07/25 0822 01/07/25 1215  GLUCAP 440* 297* 164* 140* 339*    SpO2: 100 %    Vitals:   01/07/25 0415 01/07/25 0532 01/07/25 0818 01/07/25 1100  BP:  114/63 116/63 118/70  Pulse:  61 60 61  Resp:      Temp:   97.8 F (36.6 C) 98.5 F (36.9 C)  TempSrc:      SpO2:  100% 99% 100%  Weight: 58.1 kg     Height:          Data Reviewed:  Basic Metabolic Panel: Recent Labs  Lab 01/01/25 1621 01/02/25 0212 01/03/25 0500  NA 131* 132* 132*  K 5.4* 5.0 4.4  CL 94* 94* 96*  CO2 27 29 25   GLUCOSE 160* 166* 144*  BUN 39* 40* 28*  CREATININE 1.75* 1.63* 1.22  CALCIUM  10.1 10.3 9.9  MG  --  1.6* 1.8  PHOS  --   --  2.9    CBC: Recent Labs  Lab 01/01/25 1621 01/02/25 0212 01/03/25 0500  WBC 19.0* 15.5* 7.0  NEUTROABS 15.6* 12.8* 3.9  HGB 12.3* 12.3* 12.3*  HCT 38.4* 37.5* 38.6*  MCV 81.9 81.3 81.8  PLT 204 190 188    LFT Recent Labs  Lab 01/02/25 0212 01/03/25 0500  AST 16 25  ALT 7 9  ALKPHOS 73 75  BILITOT 1.2 1.3*  PROT 6.9 6.7  ALBUMIN  3.8 3.5     Antibiotics: Anti-infectives (From admission, onward)    Start     Dose/Rate Route Frequency Ordered Stop   01/05/25 1000  azithromycin  (ZITHROMAX ) tablet 500 mg  Status:  Discontinued        500 mg Oral Daily 01/04/25 1529 01/04/25 1530   01/04/25 1529  azithromycin  (ZITHROMAX ) tablet 500 mg        500 mg Oral Daily 01/04/25 1530 01/06/25 0932    01/02/25 1415  cefTRIAXone  (ROCEPHIN ) 2 g in sodium chloride  0.9 % 100 mL IVPB        2 g 200 mL/hr over 30 Minutes Intravenous Every 24 hours 01/02/25 1402 01/06/25 1533   01/02/25 1415  azithromycin  (ZITHROMAX ) 500 mg in sodium chloride  0.9 % 250 mL IVPB  Status:  Discontinued        500 mg 250 mL/hr over 60 Minutes Intravenous Every 24 hours 01/02/25 1402 01/04/25 1528        CONSULTS   Code Status: DNR  Family Communication: No family at bedside     Subjective   Patient's p.o. intake has significantly improved.  CBG now elevated.   Objective    Physical Examination:  Appears in no acute distress, oriented to self only S1-S2, regular, no murmur auscultated Lungs clear to auscultation bilaterally Abdomen is soft, nontender, no organomegaly     Wound 11/25/24 1300 Pressure Injury Sacrum Medial Unstageable - Full thickness tissue loss in which the base of the injury is covered by slough (yellow, tan, gray, green or brown) and/or eschar (tan, brown or black) in the wound bed. (Active)     Wound Pressure Injury Scrotum Medial Stage 2 -  Partial thickness loss of dermis presenting as a shallow open injury with a red, pink wound bed without slough. (Active)        Andre Stout   [facility] If 7PM-7AM, please contact night-coverage at [email or web address], Office  [phone]   01/07/2025, 1:44 PM  LOS: 4 days

## 2025-01-07 NOTE — Progress Notes (Signed)
 Physical Therapy Treatment Patient Details Name: Andre Stout MRN: [id number] DOB: [birth year] Today's Date: 01/07/2025   History of Present Illness 85 y.o. male admitted from SNF on 01/03/24 with hypoglycemia, concern for potential PNA. Of note, recent admission 10/2024-11/2024 with sepsis, PNA; worsening functional status since then per chart. Other PMH includes advanced dementia, DM2, CKD 3A, HFrEF, CVA, glaucoma, prostate CA.    PT Comments  Progressing towards acute functional goals.  Min assist for bed mobility, sit to stand transition with RW, and step pivot transfer to recliner. Demonstrates posterior lean needing assistance for balance, RW control, and cues for sequencing and to facilitate. Patient will continue to benefit from skilled physical therapy services to further improve independence with functional mobility.      If plan is discharge home, recommend the following: A lot of help with walking and/or transfers;A lot of help with bathing/dressing/bathroom;Assistance with cooking/housework;Assist for transportation;Help with stairs or ramp for entrance;Direct supervision/assist for medications management;Direct supervision/assist for financial management   Can travel by private vehicle     No (likely soon)  Equipment Recommendations   (TBD - if home, potential walker, wheelchair, BSC)    Recommendations for Other Services       Precautions / Restrictions Precautions Precautions: Fall;Other (comment) Recall of Precautions/Restrictions: Impaired Precaution/Restrictions Comments: sacral wound Restrictions Weight Bearing Restrictions Per Provider Order: No     Mobility  Bed Mobility Overal bed mobility: Needs Assistance Bed Mobility: Supine to Sit     Supine to sit: Min assist     General bed mobility comments: Increased time, min assist to facilitate LEs and sequence to EOB, pull through therapist's hand to rise.    Transfers Overall transfer level: Needs  assistance Equipment used: Rolling walker (2 wheels) Transfers: Sit to/from Stand, Bed to chair/wheelchair/BSC Sit to Stand: Min assist   Step pivot transfers: Min assist       General transfer comment: Min assist for boost and balance, cues for set-up with hand and foot placement. min assist for balance and RW control to step pivot to recliner. No overt buckling, has some posterior bias though.    Ambulation/Gait                   Stairs             Wheelchair Mobility     Tilt Bed    Modified Rankin (Stroke Patients Only)       Balance Overall balance assessment: Needs assistance Sitting-balance support: No upper extremity supported, Feet supported Sitting balance-Leahy Scale: Fair     Standing balance support: Bilateral upper extremity supported, Reliant on assistive device for balance Standing balance-Leahy Scale: Poor                              Communication Communication Communication: Impaired Factors Affecting Communication: Hearing impaired  Cognition Arousal: Alert Behavior During Therapy: Flat affect   PT - Cognitive impairments: History of cognitive impairments                         Following commands: Impaired      Cueing Cueing Techniques: Verbal cues  Exercises      General Comments        Pertinent Vitals/Pain Pain Assessment Pain Assessment: No/denies pain    Home Living  Prior Function            PT Goals (current goals can now be found in the care plan section) Acute Rehab PT Goals Patient Stated Goal: none stated PT Goal Formulation: With patient Time For Goal Achievement: 01/18/25 Potential to Achieve Goals: Fair Progress towards PT goals: Progressing toward goals    Frequency    Min 1X/week      PT Plan      Co-evaluation              AM-PAC PT 6 Clicks Mobility   Outcome Measure  Help needed turning from your back to your  side while in a flat bed without using bedrails?: A Little Help needed moving from lying on your back to sitting on the side of a flat bed without using bedrails?: A Little Help needed moving to and from a bed to a chair (including a wheelchair)?: A Lot Help needed standing up from a chair using your arms (e.g., wheelchair or bedside chair)?: A Lot Help needed to walk in hospital room?: Total Help needed climbing 3-5 steps with a railing? : Total 6 Click Score: 12    End of Session Equipment Utilized During Treatment: Gait belt Activity Tolerance: Patient tolerated treatment well Patient left: with call bell/phone within reach;in chair;with chair alarm set Nurse Communication: Mobility status (Recommend limit time in chair <1.5 hr due to pressure sore.) PT Visit Diagnosis: Other abnormalities of gait and mobility (R26.89);Muscle weakness (generalized) (M62.81)     Time: 8445-8391 PT Time Calculation (min) (ACUTE ONLY): 14 min  Charges:    $Therapeutic Activity: 8-22 mins PT General Charges $$ ACUTE PT VISIT: 1 Visit                     Leontine Roads, PT, DPT [facility]  Rehabilitation Services Physical Therapist Office: [phone] Website: [email or web address]    Leontine GORMAN Roads 01/07/2025, 4:27 PM

## 2025-01-07 NOTE — Plan of Care (Signed)

## 2025-01-08 DIAGNOSIS — E119 Type 2 diabetes mellitus without complications: Secondary | ICD-10-CM | POA: Diagnosis not present

## 2025-01-08 DIAGNOSIS — D72829 Elevated white blood cell count, unspecified: Secondary | ICD-10-CM | POA: Diagnosis not present

## 2025-01-08 DIAGNOSIS — E162 Hypoglycemia, unspecified: Secondary | ICD-10-CM | POA: Diagnosis not present

## 2025-01-08 DIAGNOSIS — N1831 Chronic kidney disease, stage 3a: Secondary | ICD-10-CM | POA: Diagnosis not present

## 2025-01-08 LAB — GLUCOSE, CAPILLARY
Glucose-Capillary: 351 mg/dL — ABNORMAL HIGH (ref 70–99)
Glucose-Capillary: 307 mg/dL — ABNORMAL HIGH (ref 70–99)
Glucose-Capillary: 299 mg/dL — ABNORMAL HIGH (ref 70–99)
Glucose-Capillary: 73 mg/dL (ref 70–99)

## 2025-01-08 NOTE — Plan of Care (Signed)

## 2025-01-08 NOTE — TOC Progression Note (Signed)
 Transition of Care [facility]) - Progression Note    Patient Details  Name: Andre Stout MRN: [id number] Date of Birth: 15-May-1940  Transition of Care [facility]) CM/SW Contact  Sherline Clack, [place] Phone Number: 01/08/2025, 9:36 AM  Clinical Narrative:     [name] has bed availabiltiy, which CSW communicated to patient's daughter. Daughter would like for CSW to accept bed offer and start insurance authorization. Insurance auth submitted and pending at this time, auth ID: [id number]. CSW will monitor auth and update DC plan.    Anticipated Discharge Plan: Skilled Nursing Facility Barriers to Discharge: English As A Second Language Teacher, SNF Pending Bed Offer               Expected Discharge Plan and Services       Living arrangements for the past 2 months: Single Family Home                                       Social Drivers of Health (SDOH) Interventions SDOH Screenings   Food Insecurity: No Food Insecurity (01/03/2025)  Housing: Low Risk (01/03/2025)  Transportation Needs: No Transportation Needs (01/03/2025)  Utilities: Not At Risk (01/03/2025)  Alcohol Screen: Low Risk (07/24/2023)  Depression (PHQ2-9): Low Risk (07/24/2023)  Financial Resource Strain: Low Risk (10/26/2024)  Physical Activity: Inactive (10/26/2024)  Social Connections: Unknown (01/03/2025)  Recent Concern: Social Connections - Socially Isolated (10/26/2024)  Stress: No Stress Concern Present (10/26/2024)  Tobacco Use: Medium Risk (01/02/2025)  Health Literacy: Adequate Health Literacy (07/24/2023)    Readmission Risk Interventions    11/06/2024   10:49 AM 08/15/2022    9:25 AM  Readmission Risk Prevention Plan  Transportation Screening Complete Complete  PCP or Specialist Appt within 5-7 Days  Complete  Home Care Screening  Complete  Medication Review (RN CM)  Complete  Medication Review (RN Care Manager) Complete   PCP or Specialist appointment within 3-5 days of discharge Complete   HRI  or Home Care Consult Complete   Palliative Care Screening Not Applicable   Skilled Nursing Facility Not Applicable

## 2025-01-08 NOTE — Progress Notes (Signed)
 [facility]  PROGRESS NOTE  Andre Stout DOB: 08-26-1940 DOA: 01/02/2025 PCP: Pcp, No   Brief HPI:   85 y.o. male with medical history significant for type 2 diabetes mellitus, advanced dementia, CKD 3A with baseline creatinine 1.3-1.7 who is admitted to [facility] on 01/02/2025 with refractory hypoglycemia after presenting from SNF to [facility] ED complaining of refractory hypoglycemia.     Assessment/Plan:   Hypoglycemia/diabetes mellitus type 2 -Resolved - Likely in setting of poor p.o. intake - A1c 8.5, basal insulin  was discontinued a few weeks ago. - Has been getting sliding scale insulin  with NovoLog  with metformin  - CBG has been labile, will glucose was 84 at 5 PM yesterday and into 340s this morning -Started on Lantus  5 units SQ twice daily, sliding scale insulin  with NovoLog  -Continue NovoLog  3 units 3 times daily meal coverage -Diet changed from regular to carb modified diet    Leukocytosis -Resolved; he is afebrile -He was started on antibiotics on 01/02/2025 -Chest x-ray showed new opacity in the left mid/lower lung atelectasis or infiltrate -UA is clear  ?  Pneumonia -Chest x-ray showed new opacity in the left mid/lower lung, atelectasis or infiltrate - Completed antibiotics for 5 days  Acute kidney injury on CKD stage IIIa - Insetting of poor p.o. 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% on echo from 10/2024 Hold diuretic in setting of aki above   Decubitus Ulcer Appreciate WOC c/s   Hypomagnesemia Replete  B12 deficiency -B12 less than 150 Supplement   Dyslipidemia Statin on hold   GERD Continue pantoprazole   Goals of care - Palliative care consulted - Patient is DNR/DNI - No feeding  tubes  Disposition - Awaiting bed at skilled nursing facility  DVT prophylaxis: SCDs  Medications     collagenase    Topical Daily   vitamin B-12  1,000 mcg Oral Daily   insulin  aspart  0-5 Units Subcutaneous QHS   insulin  aspart  0-9 Units Subcutaneous TID WC   insulin  aspart  3 Units Subcutaneous TID WC   insulin  glargine-yfgn  5 Units Subcutaneous BID   pantoprazole   40 mg Oral Daily   thiamine   100 mg Oral Daily     Data Reviewed:   CBG:  Recent Labs  Lab 01/07/25 0822 01/07/25 1215 01/07/25 1715 01/07/25 2039 01/08/25 0816  GLUCAP 140* 339* 84 145* 351*    SpO2: 100 %    Vitals:   01/07/25 2037 01/08/25 0009 01/08/25 0356 01/08/25 0820  BP: 116/63 (!) 101/52 (!) 123/57 124/61  Pulse: 63 (!) 58 67 (!) 55  Resp: 20 20 20    Temp: 98.2 F (36.8 C) 97.8 F (36.6 C) (!) 97.3 F (36.3 C) (!) 97.4 F (36.3 C)  TempSrc:      SpO2: 100% 100% 100% 100%  Weight:   59.8 kg   Height:          Data Reviewed:  Basic Metabolic Panel: Recent Labs  Lab 01/01/25 1621 01/02/25 0212 01/03/25 0500  NA 131* 132* 132*  K 5.4* 5.0 4.4  CL 94* 94* 96*  CO2 27 29 25   GLUCOSE 160* 166* 144*  BUN 39* 40* 28*  CREATININE 1.75* 1.63* 1.22  CALCIUM  10.1 10.3 9.9  MG  --  1.6* 1.8  PHOS  --   --  2.9    CBC: Recent Labs  Lab 01/01/25 1621 01/02/25 0212 01/03/25 0500  WBC 19.0* 15.5* 7.0  NEUTROABS 15.6* 12.8* 3.9  HGB 12.3* 12.3* 12.3*  HCT 38.4* 37.5* 38.6*  MCV 81.9 81.3 81.8  PLT 204 190 188    LFT Recent Labs  Lab 01/02/25 0212 01/03/25 0500  AST 16 25  ALT 7 9  ALKPHOS 73 75  BILITOT 1.2 1.3*  PROT 6.9 6.7  ALBUMIN  3.8 3.5     Antibiotics: Anti-infectives (From admission, onward)    Start     Dose/Rate Route Frequency Ordered Stop   01/05/25 1000  azithromycin  (ZITHROMAX ) tablet 500 mg  Status:  Discontinued        500 mg Oral Daily 01/04/25 1529 01/04/25 1530   01/04/25 1529  azithromycin  (ZITHROMAX ) tablet 500 mg        500 mg Oral  Daily 01/04/25 1530 01/06/25 0932   01/02/25 1415  cefTRIAXone  (ROCEPHIN ) 2 g in sodium chloride  0.9 % 100 mL IVPB        2 g 200 mL/hr over 30 Minutes Intravenous Every 24 hours 01/02/25 1402 01/06/25 1533   01/02/25 1415  azithromycin  (ZITHROMAX ) 500 mg in sodium chloride  0.9 % 250 mL IVPB  Status:  Discontinued        500 mg 250 mL/hr over 60 Minutes Intravenous Every 24 hours 01/02/25 1402 01/04/25 1528        CONSULTS   Code Status: DNR  Family Communication: No family at bedside     Subjective   Denies any complaints   Objective    Physical Examination:  Appears in no acute distress S1-S2, regular, no murmur auscultated Lungs clear to auscultation bilaterally Abdomen is soft, nontender, no organomegaly Alert, oriented to self only     Wound 11/25/24 1300 Pressure Injury Sacrum Medial Unstageable - Full thickness tissue loss in which the base of the injury is covered by slough (yellow, tan, gray, green or brown) and/or eschar (tan, brown or black) in the wound bed. (Active)     Wound Pressure Injury Scrotum Medial Stage 2 -  Partial thickness loss of dermis presenting as a shallow open injury with a red, pink wound bed without slough. (Active)        Andre Stout   [facility] If 7PM-7AM, please contact night-coverage at [email or web address], Office  [phone]   01/08/2025, 9:20 AM  LOS: 5 days

## 2025-01-09 DIAGNOSIS — E162 Hypoglycemia, unspecified: Secondary | ICD-10-CM | POA: Diagnosis not present

## 2025-01-09 DIAGNOSIS — D72829 Elevated white blood cell count, unspecified: Secondary | ICD-10-CM | POA: Diagnosis not present

## 2025-01-09 DIAGNOSIS — E119 Type 2 diabetes mellitus without complications: Secondary | ICD-10-CM | POA: Diagnosis not present

## 2025-01-09 LAB — GLUCOSE, CAPILLARY
Glucose-Capillary: 192 mg/dL — ABNORMAL HIGH (ref 70–99)
Glucose-Capillary: 237 mg/dL — ABNORMAL HIGH (ref 70–99)
Glucose-Capillary: 370 mg/dL — ABNORMAL HIGH (ref 70–99)
Glucose-Capillary: 312 mg/dL — ABNORMAL HIGH (ref 70–99)
Glucose-Capillary: 352 mg/dL — ABNORMAL HIGH (ref 70–99)
Glucose-Capillary: 213 mg/dL — ABNORMAL HIGH (ref 70–99)

## 2025-01-09 LAB — GLUCOSE, RANDOM: Glucose, Bld: 328 mg/dL — ABNORMAL HIGH (ref 70–99)

## 2025-01-09 NOTE — Progress Notes (Signed)
 [facility]  PROGRESS NOTE  MERVIL WACKER FMW:[id number] DOB: Feb 21, 1940 DOA: 01/02/2025 PCP: Pcp, No   Brief HPI:   85 y.o. male with medical history significant for type 2 diabetes mellitus, advanced dementia, CKD 3A with baseline creatinine 1.3-1.7 who is admitted to [facility] on 01/02/2025 with refractory hypoglycemia after presenting from SNF to [facility] ED complaining of refractory hypoglycemia.     Assessment/Plan:   Hypoglycemia/diabetes mellitus type 2 -Resolved - Likely in setting of poor p.o. intake - A1c 8.5, basal insulin  was discontinued a few weeks ago. - Has been getting sliding scale insulin  with NovoLog  with metformin  - CBG has been labile, will glucose was 84 at 5 PM yesterday and into 340s this morning -Started on Lantus  5 units SQ twice daily, sliding scale insulin  with NovoLog  -Continue NovoLog  3 units 3 times daily meal coverage -Diet changed from regular to carb modified diet   Leukocytosis -Resolved; he is afebrile -He was started on antibiotics on 01/02/2025 -Chest x-ray showed new opacity in the left mid/lower lung atelectasis or infiltrate -UA is clear  ?  Pneumonia -Chest x-ray showed new opacity in the left mid/lower lung, atelectasis or infiltrate - Completed antibiotics for 5 days  Acute kidney injury on CKD stage IIIa - Insetting of poor p.o. 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% on echo from 10/2024 Hold diuretic in setting of aki above   Decubitus Ulcer Appreciate WOC c/s   Hypomagnesemia Replete  B12 deficiency -B12 less than 150 Supplement   Dyslipidemia Statin on hold   GERD Continue pantoprazole   Goals of care - Palliative care consulted - Patient is DNR/DNI - No feeding  tubes  Disposition - Awaiting bed at skilled nursing facility  DVT prophylaxis: SCDs  Medications     collagenase    Topical Daily   vitamin B-12  1,000 mcg Oral Daily   insulin  aspart  0-5 Units Subcutaneous QHS   insulin  aspart  0-9 Units Subcutaneous TID WC   insulin  aspart  3 Units Subcutaneous TID WC   insulin  glargine-yfgn  5 Units Subcutaneous BID   pantoprazole   40 mg Oral Daily   thiamine   100 mg Oral Daily     Data Reviewed:   CBG:  Recent Labs  Lab 01/08/25 0816 01/08/25 1153 01/08/25 1702 01/08/25 2042 01/09/25 0814  GLUCAP 351* 307* 299* 73 192*    SpO2: 100 %    Vitals:   01/08/25 2044 01/08/25 2359 01/09/25 0410 01/09/25 0813  BP: (!) 94/50 (!) 99/45 (!) 116/57 (!) 103/56  Pulse: (!) 57 (!) 57 (!) 57 (!) 55  Resp: 18 20 20 18   Temp: 98.4 F (36.9 C) 98.4 F (36.9 C) 98.3 F (36.8 C) 98.3 F (36.8 C)  TempSrc: Oral     SpO2: 100% 100% 100% 100%  Weight:   59.8 kg   Height:          Data Reviewed:  Basic Metabolic Panel: Recent Labs  Lab 01/03/25 0500  NA 132*  K 4.4  CL 96*  CO2 25  GLUCOSE 144*  BUN 28*  CREATININE 1.22  CALCIUM  9.9  MG 1.8  PHOS 2.9    CBC: Recent Labs  Lab 01/03/25 0500  WBC 7.0  NEUTROABS 3.9  HGB 12.3*  HCT 38.6*  MCV 81.8  PLT 188    LFT Recent Labs  Lab 01/03/25 0500  AST 25  ALT 9  ALKPHOS 75  BILITOT 1.3*  PROT 6.7  ALBUMIN  3.5     Antibiotics: Anti-infectives (From admission, onward)    Start     Dose/Rate Route Frequency Ordered Stop   01/05/25 1000  azithromycin  (ZITHROMAX ) tablet 500 mg  Status:  Discontinued        500 mg Oral Daily 01/04/25 1529 01/04/25 1530   01/04/25 1529  azithromycin  (ZITHROMAX ) tablet 500 mg        500 mg Oral Daily 01/04/25 1530 01/06/25 0932   01/02/25 1415  cefTRIAXone  (ROCEPHIN ) 2 g in sodium chloride  0.9 % 100 mL IVPB        2 g 200 mL/hr over 30 Minutes Intravenous Every 24 hours 01/02/25 1402 01/06/25 1533   01/02/25 1415  azithromycin   (ZITHROMAX ) 500 mg in sodium chloride  0.9 % 250 mL IVPB  Status:  Discontinued        500 mg 250 mL/hr over 60 Minutes Intravenous Every 24 hours 01/02/25 1402 01/04/25 1528        CONSULTS   Code Status: DNR  Family Communication: No family at bedside     Subjective   Patient seen and examined, denies any complaints.   Objective    Physical Examination:   General-appears in no acute distress Heart-S1-S2, regular, no murmur auscultated Lungs-clear to auscultation bilaterally, no wheezing or crackles auscultated Abdomen-soft, nontender, no organomegaly Extremities-no edema in lower extremities Neuro-alert, oriented to self and place only    Wound 11/25/24 1300 Pressure Injury Sacrum Medial Unstageable - Full thickness tissue loss in which the base of the injury is covered by slough (yellow, tan, gray, green or brown) and/or eschar (tan, brown or black) in the wound bed. (Active)     Wound Pressure Injury Scrotum Medial Stage 2 -  Partial thickness loss of dermis presenting as a shallow open injury with a red, pink wound bed without slough. (Active)        [name] [name]   [facility] If 7PM-7AM, please contact night-coverage at [email or web address], Office  [phone]   01/09/2025, 9:59 AM  LOS: 6 days

## 2025-01-09 NOTE — Plan of Care (Signed)

## 2025-01-09 NOTE — TOC Progression Note (Addendum)
 Transition of Care [facility]) - Progression Note    Patient Details  Name: Andre Stout MRN: [id number] Date of Birth: 07-05-1940  Transition of [facility]) CM/SW Contact  Gwenn Frieze [place], [place] Phone Number: 01/09/2025, 9:26 AM  Clinical Narrative: Per [facility] auth received for [name]: [id number], [id number], valid 1/9-1/13. Updated [name] and requested update re bed availability over weekend, await response.   1004: Per [name], no bed availability over weekend. Will f/u Monday. MD updated.   Frieze Gwenn, MSW, LCSW [phone] (coverage)         Barriers to Discharge: Insurance Authorization               Expected Discharge Plan and Services       Living arrangements for the past 2 months: Single Family Home                                       Social Drivers of Health (SDOH) Interventions SDOH Screenings   Food Insecurity: No Food Insecurity (01/03/2025)  Housing: Low Risk (01/03/2025)  Transportation Needs: No Transportation Needs (01/03/2025)  Utilities: Not At Risk (01/03/2025)  Alcohol Screen: Low Risk (07/24/2023)  Depression (PHQ2-9): Low Risk (07/24/2023)  Financial Resource Strain: Low Risk (10/26/2024)  Physical Activity: Inactive (10/26/2024)  Social Connections: Unknown (01/03/2025)  Recent Concern: Social Connections - Socially Isolated (10/26/2024)  Stress: No Stress Concern Present (10/26/2024)  Tobacco Use: Medium Risk (01/02/2025)  Health Literacy: Adequate Health Literacy (07/24/2023)    Readmission Risk Interventions    11/06/2024   10:49 AM 08/15/2022    9:25 AM  Readmission Risk Prevention Plan  Transportation Screening Complete Complete  PCP or Specialist Appt within 5-7 Days  Complete  Home Care Screening  Complete  Medication Review (RN CM)  Complete  Medication Review (RN Care Manager) Complete   PCP or Specialist appointment within 3-5 days of discharge Complete   HRI or Home  Care Consult Complete   Palliative Care Screening Not Applicable   Skilled Nursing Facility Not Applicable

## 2025-01-10 DIAGNOSIS — E162 Hypoglycemia, unspecified: Secondary | ICD-10-CM | POA: Diagnosis not present

## 2025-01-10 LAB — GLUCOSE, CAPILLARY
Glucose-Capillary: 78 mg/dL (ref 70–99)
Glucose-Capillary: 166 mg/dL — ABNORMAL HIGH (ref 70–99)
Glucose-Capillary: 258 mg/dL — ABNORMAL HIGH (ref 70–99)
Glucose-Capillary: 407 mg/dL — ABNORMAL HIGH (ref 70–99)
Glucose-Capillary: 420 mg/dL — ABNORMAL HIGH (ref 70–99)
Glucose-Capillary: 392 mg/dL — ABNORMAL HIGH (ref 70–99)
Glucose-Capillary: 444 mg/dL — ABNORMAL HIGH (ref 70–99)
Glucose-Capillary: 105 mg/dL — ABNORMAL HIGH (ref 70–99)

## 2025-01-10 MED ORDER — INSULIN ASPART 100 UNIT/ML IJ SOLN
15.0000 [IU] | Freq: Once | INTRAMUSCULAR | Status: AC
  Administered 2025-01-10: 15 [IU] via SUBCUTANEOUS
  Filled 2025-01-10: qty 15

## 2025-01-10 MED ORDER — INSULIN ASPART 100 UNIT/ML IJ SOLN: 15.0000 [IU] | Freq: Once | INTRAMUSCULAR | Status: DC

## 2025-01-10 MED ORDER — INSULIN ASPART 100 UNIT/ML IJ SOLN
10.0000 [IU] | Freq: Once | INTRAMUSCULAR | Status: AC
  Administered 2025-01-10: 10 [IU] via SUBCUTANEOUS
  Filled 2025-01-10: qty 10

## 2025-01-10 NOTE — Progress Notes (Signed)
 [facility]  PROGRESS NOTE  DANTRELL SCHERTZER FMW:[id number] DOB: 1940/05/22 DOA: 01/02/2025 PCP: Pcp, No   Brief HPI:   85 y.o. 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     Assessment/Plan:   # Hypoglycemia/diabetes mellitus type 2 -Resolved - Likely in setting of poor p.o. intake - A1c 8.5, basal insulin  was discontinued a few weeks ago. - Has been getting sliding scale insulin  with NovoLog  with metformin  - CBG has been labile, glucose was 7/ >> 258  -Started on Lantus  5 units SQ twice daily, sliding scale insulin  with NovoLog  -d//c;d NovoLog  3 u tid meal coverage due to poor oral intake.  We will continue NovoLog  sliding scale -Diet changed from regular to carb modified diet   Leukocytosis -Resolved; he is afebrile -He was started on antibiotics on 01/02/2025 -Chest x-ray showed new opacity in the left mid/lower lung atelectasis or infiltrate -UA is clear  ?  Pneumonia -Chest x-ray showed new opacity in the left mid/lower lung, atelectasis or infiltrate - Completed antibiotics for 5 days  Acute kidney injury on CKD stage IIIa - Insetting of poor p.o. 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% on echo from 10/2024 Hold diuretic in setting of aki above   Decubitus Ulcer Appreciate WOC c/s   Hypomagnesemia Replete  B12 deficiency -B12 less than 150 Supplement   Dyslipidemia Statin on hold   GERD Continue pantoprazole   Goals of care - Palliative care consulted - Patient is DNR/DNI - No feeding  tubes  Disposition - Awaiting bed at skilled nursing facility  DVT prophylaxis: SCDs  Medications     collagenase    Topical Daily   vitamin B-12  1,000 mcg Oral Daily   insulin  aspart  0-5 Units Subcutaneous QHS   insulin  aspart  0-9 Units Subcutaneous TID WC   insulin  glargine-yfgn  5 Units Subcutaneous BID   pantoprazole   40 mg Oral Daily   thiamine   100 mg Oral Daily     Data Reviewed:   CBG:  Recent Labs  Lab 01/09/25 1759 01/09/25 2157 01/10/25 0823 01/10/25 0924 01/10/25 1138  GLUCAP 312* 213* 78 166* 258*    SpO2: 100 %    Vitals:   01/10/25 0427 01/10/25 0500 01/10/25 0821 01/10/25 1136  BP: (!) 103/52  119/76 127/64  Pulse: (!) 54  60 (!) 58  Resp:   17 17  Temp: 97.8 F (36.6 C)  98.3 F (36.8 C)   TempSrc: Oral  Oral   SpO2: 100%  100% 100%  Weight:  61.6 kg    Height:          Data Reviewed:  Basic Metabolic Panel: Recent Labs  Lab 01/09/25 1815  GLUCOSE 328*    CBC: No results for input(s): WBC, NEUTROABS, HGB, HCT, MCV, PLT in the last 168 hours.   LFT No results for input(s): AST, ALT, ALKPHOS, BILITOT, PROT, ALBUMIN  in the last 168 hours.    Antibiotics: Anti-infectives (From admission, onward)    Start     Dose/Rate Route Frequency Ordered Stop   01/05/25 1000  azithromycin  (ZITHROMAX ) tablet  500 mg  Status:  Discontinued        500 mg Oral Daily 01/04/25 1529 01/04/25 1530   01/04/25 1529  azithromycin  (ZITHROMAX ) tablet 500 mg        500 mg Oral Daily 01/04/25 1530 01/06/25 0932   01/02/25 1415  cefTRIAXone  (ROCEPHIN ) 2 g in sodium chloride  0.9 % 100 mL IVPB        2 g 200 mL/hr over 30 Minutes Intravenous Every 24 hours 01/02/25 1402 01/06/25 1533   01/02/25 1415  azithromycin  (ZITHROMAX ) 500 mg in sodium chloride  0.9 % 250 mL IVPB  Status:  Discontinued        500 mg 250 mL/hr over 60 Minutes Intravenous Every 24 hours 01/02/25 1402 01/04/25 1528        CONSULTS   Code Status:  DNR  Family Communication: No family at bedside     Subjective   Patient was seen and examined at bedside during morning rounds.   Patient was resting comfortably in the bed, denied any active issues.   Objective    Physical Examination:   General-appears in no acute distress Heart-S1-S2, regular, no murmur auscultated Lungs-clear to auscultation bilaterally, no wheezing or crackles auscultated Abdomen-soft, nontender, no organomegaly Extremities-no edema in lower extremities Neuro-alert, oriented to self and place only    Wound 11/25/24 1300 Pressure Injury Sacrum Medial Unstageable - Full thickness tissue loss in which the base of the injury is covered by slough (yellow, tan, gray, green or brown) and/or eschar (tan, brown or black) in the wound bed. (Active)     Wound Pressure Injury Scrotum Medial Stage 2 -  Partial thickness loss of dermis presenting as a shallow open injury with a red, pink wound bed without slough. (Active)    Acey Woodfield Von   [facility] If 7PM-7AM, please contact night-coverage at [email or web address], Office  [phone]   01/10/2025, 2:50 PM  LOS: 7 days

## 2025-01-10 NOTE — Progress Notes (Signed)
 Fasting BG was 78 @0823 . After breakfast, patient glucose was 166 @0924 . MD made aware. Advised this nurse to hold sliding scale of novolog  and standard units of novolog . Administering 3 unit of semglee  only. Will continue to monitor.

## 2025-01-10 NOTE — Progress Notes (Signed)
 Correction 5 units of semglee 

## 2025-01-10 NOTE — Plan of Care (Signed)

## 2025-01-11 DIAGNOSIS — E162 Hypoglycemia, unspecified: Secondary | ICD-10-CM | POA: Diagnosis not present

## 2025-01-11 LAB — GLUCOSE, CAPILLARY
Glucose-Capillary: 100 mg/dL — ABNORMAL HIGH (ref 70–99)
Glucose-Capillary: 11 mg/dL — CL (ref 70–99)
Glucose-Capillary: 133 mg/dL — ABNORMAL HIGH (ref 70–99)
Glucose-Capillary: 14 mg/dL — CL (ref 70–99)
Glucose-Capillary: 142 mg/dL — ABNORMAL HIGH (ref 70–99)
Glucose-Capillary: 167 mg/dL — ABNORMAL HIGH (ref 70–99)
Glucose-Capillary: 195 mg/dL — ABNORMAL HIGH (ref 70–99)
Glucose-Capillary: 312 mg/dL — ABNORMAL HIGH (ref 70–99)
Glucose-Capillary: 54 mg/dL — ABNORMAL LOW (ref 70–99)
Glucose-Capillary: 67 mg/dL — ABNORMAL LOW (ref 70–99)

## 2025-01-11 MED ORDER — INSULIN GLARGINE-YFGN 100 UNIT/ML ~~LOC~~ SOPN: 15.0000 [IU] | PEN_INJECTOR | Freq: Once | SUBCUTANEOUS | Status: DC

## 2025-01-11 MED ORDER — INSULIN GLARGINE-YFGN 100 UNIT/ML ~~LOC~~ SOLN
20.0000 [IU] | Freq: Every day | SUBCUTANEOUS | Status: DC
  Filled 2025-01-11: qty 0.2

## 2025-01-11 MED ORDER — INSULIN GLARGINE 100 UNIT/ML ~~LOC~~ SOLN
15.0000 [IU] | Freq: Once | SUBCUTANEOUS | Status: DC
  Filled 2025-01-11: qty 0.15

## 2025-01-11 MED ORDER — INSULIN GLARGINE-YFGN 100 UNIT/ML ~~LOC~~ SOLN: 20.0000 [IU] | Freq: Every day | SUBCUTANEOUS | Status: DC

## 2025-01-11 MED ORDER — INSULIN GLARGINE-YFGN 100 UNIT/ML ~~LOC~~ SOLN
20.0000 [IU] | Freq: Every day | SUBCUTANEOUS | Status: DC
  Administered 2025-01-12: 20 [IU] via SUBCUTANEOUS
  Filled 2025-01-11: qty 0.2

## 2025-01-11 MED ORDER — INSULIN GLARGINE 100 UNIT/ML ~~LOC~~ SOLN: 20.0000 [IU] | Freq: Every day | SUBCUTANEOUS | Status: DC

## 2025-01-11 MED ORDER — DEXTROSE 50 % IV SOLN
25.0000 g | INTRAVENOUS | Status: AC
  Administered 2025-01-11: 25 g via INTRAVENOUS

## 2025-01-11 MED ORDER — INSULIN GLARGINE-YFGN 100 UNIT/ML ~~LOC~~ SOLN
15.0000 [IU] | Freq: Once | SUBCUTANEOUS | Status: AC
  Administered 2025-01-11: 15 [IU] via SUBCUTANEOUS
  Filled 2025-01-11: qty 0.15

## 2025-01-11 MED ORDER — DEXTROSE 10 % IV SOLN: INTRAVENOUS | Status: AC

## 2025-01-11 NOTE — Progress Notes (Signed)
 [facility]  PROGRESS NOTE  Andre Stout FMW:[id number] DOB: 02-03-1940 DOA: 01/02/2025 PCP: Pcp, No   Brief HPI:   85 y.o. male with medical history significant for type 2 diabetes mellitus, advanced dementia, CKD 3A with baseline creatinine 1.3-1.7 who is admitted to [facility] on 01/02/2025 with refractory hypoglycemia after presenting from SNF to [facility] ED complaining of refractory hypoglycemia.     Assessment/Plan:   # Hypoglycemia/diabetes mellitus type 2 -Resolved - Likely in setting of poor p.o. intake - A1c 8.5, basal insulin  was discontinued a few weeks ago. - Has been getting sliding scale insulin  with NovoLog  with metformin  - CBG has been labile, glucose was 7/ >> 258  -Started on Lantus  5 units SQ twice daily, sliding scale insulin  with NovoLog  -d//c;d NovoLog  3 u tid meal coverage due to poor oral intake.  We will continue NovoLog  sliding scale -Diet changed from regular to carb modified diet 1/12 blood glucose is fluctuating, since patient has started eating diet, blood glucose is running high.  After receiving multiple doses of insulin , blood glucose dropped at midnight. Increased Lantus  20 units subcu daily, and NovoLog  sliding scale.    Leukocytosis -Resolved; he is afebrile -He was started on antibiotics on 01/02/2025 -Chest x-ray showed new opacity in the left mid/lower lung atelectasis or infiltrate -UA is clear  ?  Pneumonia -Chest x-ray showed new opacity in the left mid/lower lung, atelectasis or infiltrate - Completed antibiotics for 5 days  Acute kidney injury on CKD stage IIIa - Insetting of poor p.o. 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% on echo from 10/2024 Hold diuretic in setting of aki above   Decubitus  Ulcer Appreciate WOC c/s   Hypomagnesemia Replete  B12 deficiency -B12 less than 150 Supplement   Dyslipidemia Statin on hold   GERD Continue pantoprazole   Goals of care - Palliative care consulted - Patient is DNR/DNI - No feeding tubes  Disposition - Awaiting bed at skilled nursing facility  DVT prophylaxis: SCDs  Medications     collagenase    Topical Daily   vitamin B-12  1,000 mcg Oral Daily   insulin  aspart  0-5 Units Subcutaneous QHS   insulin  aspart  0-9 Units Subcutaneous TID WC   [START ON 01/12/2025] insulin  glargine-yfgn  20 Units Subcutaneous Daily   pantoprazole   40 mg Oral Daily   thiamine   100 mg Oral Daily     Data Reviewed:   CBG:  Recent Labs  Lab 01/11/25 0200 01/11/25 0402 01/11/25 0602 01/11/25 0820 01/11/25 1233  GLUCAP 54* 67* 142* 195* 312*    SpO2: 100 %    Vitals:   01/11/25 0015 01/11/25 0406 01/11/25 0824 01/11/25 1232  BP: 139/75 116/71 132/70 109/62  Pulse: 86 61 62 (!) 51  Resp: 16  14 14   Temp:  (!) 97.4 F (36.3 C) 97.6 F (36.4 C)   TempSrc:      SpO2: 100% 100% 100% 100%  Weight:      Height:          Data Reviewed:  Basic Metabolic Panel: Recent Labs  Lab 01/09/25 1815  GLUCOSE 328*    CBC: No results for input(s): WBC, NEUTROABS, HGB, HCT, MCV, PLT in the last 168 hours.   LFT No results for input(s):  AST, ALT, ALKPHOS, BILITOT, PROT, ALBUMIN  in the last 168 hours.    Antibiotics: Anti-infectives (From admission, onward)    Start     Dose/Rate Route Frequency Ordered Stop   01/05/25 1000  azithromycin  (ZITHROMAX ) tablet 500 mg  Status:  Discontinued        500 mg Oral Daily 01/04/25 1529 01/04/25 1530   01/04/25 1529  azithromycin  (ZITHROMAX ) tablet 500 mg        500 mg Oral Daily 01/04/25 1530 01/06/25 0932   01/02/25 1415  cefTRIAXone  (ROCEPHIN ) 2 g in sodium chloride  0.9 % 100 mL IVPB        2 g 200 mL/hr over 30 Minutes Intravenous Every 24 hours 01/02/25  1402 01/06/25 1533   01/02/25 1415  azithromycin  (ZITHROMAX ) 500 mg in sodium chloride  0.9 % 250 mL IVPB  Status:  Discontinued        500 mg 250 mL/hr over 60 Minutes Intravenous Every 24 hours 01/02/25 1402 01/04/25 1528        CONSULTS   Code Status: DNR  Family Communication: No family at bedside     Subjective   Patient was seen and examined at bedside during morning rounds.   Patient was resting comfortably in the bed, denied any active issues. Patient is a poor historian secondary to dementia and he did not notice any symptoms to low blood glucose last night.  Patient is not aware at all   Objective    Physical Examination:   General-appears in no acute distress Heart-S1-S2, regular, no murmur auscultated Lungs-clear to auscultation bilaterally, no wheezing or crackles auscultated Abdomen-soft, nontender, no organomegaly Extremities-no edema in lower extremities Neuro-alert, oriented to self and place only    Wound 11/25/24 1300 Pressure Injury Sacrum Medial Unstageable - Full thickness tissue loss in which the base of the injury is covered by slough (yellow, tan, gray, green or brown) and/or eschar (tan, brown or black) in the wound bed. (Active)     Wound Pressure Injury Scrotum Medial Stage 2 -  Partial thickness loss of dermis presenting as a shallow open injury with a red, pink wound bed without slough. (Active)    Magdalina Whitehead Von   [facility] If 7PM-7AM, please contact night-coverage at [email or web address], Office  [phone]   01/11/2025, 2:42 PM  LOS: 8 days

## 2025-01-11 NOTE — Progress Notes (Signed)
 Hypoglycemic Event  CBG: 14   Treatment: D50 50 mL (25 gm)  Symptoms: Sweaty and unresponsive  Follow-up CBG: Time: 00:31 CBG Result: 133  Possible Reasons for Event: Medication regimen:    Comments/MD notified: Dr. Charlton Hadassah LOISE Daniel

## 2025-01-11 NOTE — Significant Event (Signed)
 Rapid Response Event Note   Reason for Call :  CBG-14  Initial Focused Assessment:  Pt lying in bed with eyes closed. He will withdraw to pain but will not response otherwise. Skin cool/clammy/diaphoretic.   HR-86, BP-139/75, RR-16, SpO2-100% on RA  Interventions:  1 amp D50 IV given Repeat CBG-133  Plan of Care:  Pt now responsive and back to baseline. Continue to monitor pt closely. Please call RRT if further assistance needed.   Event Summary:   MD Notified: Opyd notified by bedside RN Call [phone] Arrival Time:0015 End Upfz:[id number]  Tish Graeme Piety, RN

## 2025-01-11 NOTE — Plan of Care (Signed)

## 2025-01-12 DIAGNOSIS — E162 Hypoglycemia, unspecified: Secondary | ICD-10-CM | POA: Diagnosis not present

## 2025-01-12 LAB — GLUCOSE, CAPILLARY
Glucose-Capillary: 204 mg/dL — ABNORMAL HIGH (ref 70–99)
Glucose-Capillary: 285 mg/dL — ABNORMAL HIGH (ref 70–99)
Glucose-Capillary: 342 mg/dL — ABNORMAL HIGH (ref 70–99)

## 2025-01-12 MED ORDER — LANTUS SOLOSTAR 100 UNIT/ML ~~LOC~~ SOPN: 20.0000 [IU] | PEN_INJECTOR | Freq: Every day | SUBCUTANEOUS | Status: AC

## 2025-01-12 NOTE — TOC CM/SW Note (Signed)
 Updated [facility] medical necessity form with correct SNF information for d/c transport.   Merilee Batty, MSN, RN Case Management [phone]

## 2025-01-12 NOTE — TOC Transition Note (Addendum)
 Transition of Care [facility]) - Discharge Note   Patient Details  Name: Andre Stout MRN: [id number] Date of Birth: [birth year]  Transition of Care [facility]) CM/SW Contact:  Sherline Clack, LCSWA Phone Number: 01/12/2025, 1:37 PM   Clinical Narrative:     Patient will DC to: [facility] Anticipated DC date: 01/12/2025  Family notified: daughter Transport by: [facility]   Per MD patient ready for DC to [facility]. RN to call report prior to discharge [phone], rm [phone]). RN, patient, patient's family, and facility notified of DC. Discharge Summary and FL2 sent to facility. DC packet on chart. Ambulance transport requested for patient.   CSW will sign off for now as social work intervention is no longer needed. Please consult us  again if new needs arise.    Final next level of care: Skilled Nursing Facility Barriers to Discharge: Barriers Resolved   Patient Goals and CMS Choice     Choice offered to / list presented to : Patient, Adult Children      Discharge Placement              Patient chooses bed at: [facility] Patient to be transferred to facility by: [facility] Name of family member notified: Michaelle Amble Garner/daugher Patient and family notified of of transfer: 01/12/25  Discharge Plan and Services Additional resources added to the After Visit Summary for                                       Social Drivers of Health (SDOH) Interventions SDOH Screenings   Food Insecurity: No Food Insecurity (01/03/2025)  Housing: Low Risk (01/03/2025)  Transportation Needs: No Transportation Needs (01/03/2025)  Utilities: Not At Risk (01/03/2025)  Alcohol Screen: Low Risk (07/24/2023)  Depression (PHQ2-9): Low Risk (07/24/2023)  Financial Resource Strain: Low Risk (10/26/2024)  Physical Activity: Inactive (10/26/2024)  Social Connections: Unknown (01/03/2025)  Recent Concern: Social Connections - Socially Isolated (10/26/2024)  Stress: No Stress Concern Present  (10/26/2024)  Tobacco Use: Medium Risk (01/02/2025)  Health Literacy: Adequate Health Literacy (07/24/2023)     Readmission Risk Interventions    11/06/2024   10:49 AM 08/15/2022    9:25 AM  Readmission Risk Prevention Plan  Transportation Screening Complete Complete  PCP or Specialist Appt within 5-7 Days  Complete  Home Care Screening  Complete  Medication Review (RN CM)  Complete  Medication Review (RN Care Manager) Complete   PCP or Specialist appointment within 3-5 days of discharge Complete   HRI or Home Care Consult Complete   Palliative Care Screening Not Applicable   Skilled Nursing Facility Not Applicable

## 2025-01-12 NOTE — TOC Progression Note (Signed)
 Transition of Care [facility]) - Progression Note    Patient Details  Name: Andre Stout MRN: [id number] Date of Birth: [birth year]  [facility] CM/SW Contact  Sherline Clack, [place] Phone Number: 01/12/2025, 9:02 AM  Clinical Narrative:     CSW reached out to [name] regarding bed availability and insurance auth authorization. [name] is not able to receive patient at this time. CSW sent list of updated bed offers to patient's daughter, and she chooses [facility]. CSW reached out to [facility] and was told facility has a bed open today (1/13) for patient. CSW will coordinate with facility and provider to discharge patient.     Barriers to Discharge: Insurance Authorization               Expected Discharge Plan and Services       Living arrangements for the past 2 months: Single Family Home                                       Social Drivers of Health (SDOH) Interventions SDOH Screenings   Food Insecurity: No Food Insecurity (01/03/2025)  Housing: Low Risk (01/03/2025)  Transportation Needs: No Transportation Needs (01/03/2025)  Utilities: Not At Risk (01/03/2025)  Alcohol Screen: Low Risk (07/24/2023)  Depression (PHQ2-9): Low Risk (07/24/2023)  Financial Resource Strain: Low Risk (10/26/2024)  Physical Activity: Inactive (10/26/2024)  Social Connections: Unknown (01/03/2025)  Recent Concern: Social Connections - Socially Isolated (10/26/2024)  Stress: No Stress Concern Present (10/26/2024)  Tobacco Use: Medium Risk (01/02/2025)  Health Literacy: Adequate Health Literacy (07/24/2023)    Readmission Risk Interventions    11/06/2024   10:49 AM 08/15/2022    9:25 AM  Readmission Risk Prevention Plan  Transportation Screening Complete Complete  PCP or Specialist Appt within 5-7 Days  Complete  Home Care Screening  Complete  Medication Review (RN CM)  Complete  Medication Review (RN Care Manager) Complete   PCP or Specialist  appointment within 3-5 days of discharge Complete   HRI or Home Care Consult Complete   Palliative Care Screening Not Applicable   Skilled Nursing Facility Not Applicable

## 2025-01-12 NOTE — Discharge Summary (Signed)
 [facility] Discharge Summary   Patient: Andre Stout FMW:[id number]  PCP: Pcp, No  Date of admission: 01/02/2025   Date of discharge:  01/12/2025     Discharge Diagnoses:  Principal Problem:   Hypoglycemia Active Problems:   Leukocytosis   CKD stage 3a, GFR 45-59 ml/min (HCC)   Hypomagnesemia   DM2 (diabetes mellitus, type 2) (HCC)   Admitted From: SNF Disposition:  SNF   Recommendations for Outpatient Follow-up:  F/u with PCP, patient needs to be seen by an MD in 1 to 2 days at the Center facility.  Monitor CBG and titrate dose of insulin  accordingly. Follow up LABS/TEST:     Contact information for after-discharge care     Destination     [facility] .   Service: Skilled Nursing Contact information: [address] [place]  [address] [phone]                    Diet recommendation: Carb modified diet  Activity: The patient is advised to gradually reintroduce usual activities, as tolerated  Discharge Condition: stable  Code Status: DNR-limited  History of present illness: As per the H and P dictated on admission.  Hospital Course:  85 y.o. male with medical history significant for type 2 diabetes mellitus, advanced dementia, CKD 3A with baseline creatinine 1.3-1.7 who is admitted to [facility] on 01/02/2025 with refractory hypoglycemia after presenting from SNF to [facility] ED complaining of refractory hypoglycemia.    Assessment/Plan:   # Hypoglycemia/diabetes mellitus type 2 -Resolved.  Likely in setting of poor p.o. intake - A1c 8.5, basal insulin  was discontinued a few weeks ago. - Has been getting sliding scale insulin  with NovoLog  with metformin  - CBG has been labile, due to his fluctuating oral intake. -s/p Lantus  5 units SQ twice daily, sliding scale insulin  with NovoLog  -d//c;d NovoLog  3 u tid meal coverage due to poor oral intake and continued NovoLog  sliding scale -Diet changed from regular to carb modified  diet 1/12 blood glucose was fluctuating, since patient has started eating diet, blood glucose is running high.  After receiving multiple doses of insulin , blood glucose dropped at midnight. Increased Lantus  20 units subcu daily, and NovoLog  sliding scale. 1/13 today CBC looks fine, plan is to discharge him back to the facility, continue Lantus  20 units subcu daily, NovoLog  sliding scale and titrate dose of insulin  accordingly.    # Leukocytosis: Resolved; he is afebrile -He was started on antibiotics on 01/02/2025 -Chest x-ray showed new opacity in the left mid/lower lung atelectasis or infiltrate. UA is clear   # ?  Pneumonia  -Chest x-ray showed new opacity in the left mid/lower lung, atelectasis or infiltrate. - Completed antibiotics for 5 days   # Acute kidney injury on CKD stage IIIa - Insetting of poor p.o. intake/dehydration - Baseline creatinine 1.25 - 1/4 creatinine is 1.22, at baseline   # Dementia- At baseline - At home he was walking with walker but declined after his recent hospitalization. 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  Patient was on torsemide  20 mg p.o. every other day which was held during hospital stay due to AKI.  Resumed same dose on discharge as patient's oral intake has improved.  Repeat BMP in 1 week and titrating dose of torsemide  accordingly.  Can hold off if patient is euvolemic and dry.  # Decubitus Ulcer: Appreciate WOC c/s # Hypomagnesemia: Mag repleted.   #  B12 deficiency: -B12 less than 150, continue supplement # Dyslipidemia: Resumed Lipitor 40 mg p.o. daily PTA dose # GERD: Resumed omeprazole  PTA dose   Goals of care - Palliative care consulted - Patient is DNR/DNI - No feeding tubes  Body mass index is 22.6 kg/m.  Nutrition Interventions:  Wound 11/25/24 1300 Pressure Injury Sacrum Medial Unstageable - Full thickness tissue loss in which the base of the injury is covered by slough (yellow,  tan, gray, green or brown) and/or eschar (tan, brown or black) in the wound bed. (Active)     Wound Pressure Injury Scrotum Medial Stage 2 -  Partial thickness loss of dermis presenting as a shallow open injury with a red, pink wound bed without slough. (Active)     Patient was seen by physical therapy, who recommended Therapy, SNF placement, which was arranged. On the day of the discharge the patient's vitals were stable, and no other acute medical condition were reported by patient. the patient was felt safe to be discharge at SNF with Therapy.  Consultants: None Procedures: None  Discharge Exam: General: Appear in no distress, Oral Mucosa Clear, moist. Cardiovascular: S1 and S2 Present, no Murmur, Respiratory: normal respiratory effort, Bilateral Air entry present and no Crackles, no wheezes Abdomen: Bowel Sound present, Soft and no tenderness. Extremities: no Pedal edema, no calf tenderness Neurology: Dementia, AAOX 2 (person and place), no focal deficits. affect appropriate.  Filed Weights   01/08/25 0356 01/09/25 0410 01/10/25 0500  Weight: 59.8 kg 59.8 kg 61.6 kg   Vitals:   01/12/25 0430 01/12/25 0750  BP: (!) 144/63 125/78  Pulse: 63 63  Resp:  18  Temp: 98.1 F (36.7 C) 98.3 F (36.8 C)  SpO2: 100% 99%    DISCHARGE MEDICATION: Allergies as of 01/12/2025   No Known Allergies      Medication List     TAKE these medications    acetaminophen  500 MG tablet Commonly known as: TYLENOL  Take 500 mg by mouth every 8 (eight) hours as needed for mild pain (pain score 1-3) or moderate pain (pain score 4-6).   ascorbic acid 500 MG tablet Commonly known as: VITAMIN C Take 500 mg by mouth at bedtime.   atorvastatin  40 MG tablet Commonly known as: LIPITOR Take 1 tablet (40 mg total) by mouth daily. What changed: when to take this   Azelastine  HCl 137 MCG/SPRAY Soln PLACE 1 SPRAY INTO BOTH NOSTRILS 2 (TWO) TIMES DAILY AS NEEDED. NEEDS APPT What changed:  when to  take this reasons to take this additional instructions   cyanocobalamin  1000 MCG tablet Commonly known as: VITAMIN B12 Take 1 tablet (1,000 mcg total) by mouth daily.   feeding supplement (GLUCERNA SHAKE) Liqd Take 120 mLs by mouth in the morning and at bedtime. For wound healing   ProSource Liqd Take 30 mLs by mouth in the morning and at bedtime. For wound healing   FeroSul 325 (65 Fe) MG tablet Generic drug: ferrous sulfate  Take 1 tablet (325 mg total) by mouth daily with breakfast.   Glucagon  1 MG Solr Inject 1 mg into the muscle as needed (for hypoglycemia).   Lantus  SoloStar 100 UNIT/ML Solostar Pen Generic drug: insulin  glargine Inject 20 Units into the skin daily. What changed: how much to take   magnesium  oxide 400 (240 Mg) MG tablet Commonly known as: MAG-OX TAKE 1 TABLET BY MOUTH TWICE A DAY   metFORMIN  500 MG tablet Commonly known as: GLUCOPHAGE  Take 1 tablet (500 mg total) by mouth  2 (two) times daily with a meal.   Minerin Creme Crea Apply 1 Application topically in the morning and at bedtime. Apply to feet   NovoLOG  FlexPen 100 UNIT/ML FlexPen Generic drug: insulin  aspart Inject under skin  5-7 units 10-15 min before meals 3-4 times a day   omeprazole  40 MG capsule Commonly known as: PRILOSEC Take 1 capsule (40 mg total) by mouth in the morning.   potassium chloride  SA 20 MEQ tablet Commonly known as: KLOR-CON  M Take 1 tablet (20 mEq total) by mouth daily.   PreserVision AREDS 2 Caps Take 1 capsule by mouth daily.   torsemide  20 MG tablet Commonly known as: DEMADEX  Take 1 tablet (20 mg total) by mouth every other day.               Discharge Care Instructions  (From admission, onward)           Start     Ordered   01/12/25 0000  Discharge wound care:       Comments: As above   01/12/25 1051           Allergies[1] Discharge Instructions     Call MD for:   Complete by: As directed    Uncontrolled blood glucose level    Call MD for:  difficulty breathing, headache or visual disturbances   Complete by: As directed    Call MD for:  extreme fatigue   Complete by: As directed    Call MD for:  persistant dizziness or light-headedness   Complete by: As directed    Call MD for:  persistant nausea and vomiting   Complete by: As directed    Call MD for:  severe uncontrolled pain   Complete by: As directed    Call MD for:  temperature >100.4   Complete by: As directed    Discharge instructions   Complete by: As directed    Will follow with PCP, patient needs to be seen by an MD in 1 to 2 days at the Center facility.  Monitor CBG and titrate dose of insulin  accordingly.   Discharge wound care:   Complete by: As directed    As above   Increase activity slowly   Complete by: As directed        The results of significant diagnostics from this hospitalization (including imaging, microbiology, ancillary and laboratory) are listed below for reference.    Significant Diagnostic Studies: DG CHEST PORT 1 VIEW Result Date: 01/02/2025 EXAM: 1 VIEW(S) XRAY OF THE CHEST 01/02/2025 08:45:00 AM COMPARISON: 11/26/2024 CLINICAL HISTORY: Hypoglycemia FINDINGS: LUNGS AND PLEURA: Resolved right upper lung opacity. New opacity in the left mid / lower lung may be atelectasis or infiltrate. No pleural effusion. No pneumothorax. HEART AND MEDIASTINUM: Aortic atherosclerosis. No acute abnormality of the cardiac and mediastinal silhouettes. BONES AND SOFT TISSUES: No acute osseous abnormality. IMPRESSION: 1. New opacity in the left mid/lower lung; atelectasis or infiltrate. Electronically signed by: Norman Gatlin MD 01/02/2025 08:50 AM EST RP Workstation: [id number]    Microbiology: No results found for this or any previous visit (from the past 240 hours).   Labs: CBC: No results for input(s): WBC, NEUTROABS, HGB, HCT, MCV, PLT in the last 168 hours. Basic Metabolic Panel: Recent Labs  Lab 01/09/25 1815  GLUCOSE  328*   Liver Function Tests: No results for input(s): AST, ALT, ALKPHOS, BILITOT, PROT, ALBUMIN  in the last 168 hours. No results for input(s): LIPASE, AMYLASE in the last 168 hours. No results  for input(s): AMMONIA in the last 168 hours. Cardiac Enzymes: No results for input(s): CKTOTAL, CKMB, CKMBINDEX, TROPONINI in the last 168 hours. BNP (last 3 results) Recent Labs    09/02/24 0257 11/01/24 1830 11/21/24 1229  BNP 1,092.1* 2,276.0* 1,240.6*   CBG: Recent Labs  Lab 01/11/25 1233 01/11/25 1642 01/11/25 2024 01/12/25 0022 01/12/25 0751  GLUCAP 312* 167* 100* 204* 285*    Time spent: 35 minutes  Signed:  Elvan Sor  [facility] 01/12/2025 11:04 AM      [1] No Known Allergies

## 2025-01-12 NOTE — Progress Notes (Signed)
 Physical Therapy Treatment Patient Details Name: Andre Stout MRN: [id number] DOB: November 19, 1940 Today's Date: 01/12/2025   History of Present Illness 85 y.o. male admitted from SNF on 01/03/24 with hypoglycemia, concern for potential PNA. Of note, recent admission 10/2024-11/2024 with sepsis, PNA; worsening functional status since then per chart. Other PMH includes advanced dementia, DM2, CKD 3A, HFrEF, CVA, glaucoma, prostate CA.    PT Comments  Pt is currently presenting at CGA to Min A for bed mobility, CGA to Min A for sit to stand and able to side step at EOB for 2 ft with RW at Min A. Pt requires heavy step by step instruction for side stepping. Pt currently was able to march at EOB with Min A with heavy Bil UE support on RW. Pt limited by fatigue. Due to pt current functional status, home set up and available assistance at home recommending skilled physical therapy services < 3 hours/day in order to address strength, balance and functional mobility to decrease risk for falls, injury, immobility, skin break down and re-hospitalization.      If plan is discharge home, recommend the following: A lot of help with walking and/or transfers;A lot of help with bathing/dressing/bathroom;Assistance with cooking/housework;Assist for transportation;Help with stairs or ramp for entrance;Direct supervision/assist for medications management;Direct supervision/assist for financial management   Can travel by private vehicle     No  Equipment Recommendations  None recommended by PT       Precautions / Restrictions Precautions Precautions: Fall;Other (comment) Recall of Precautions/Restrictions: Impaired Precaution/Restrictions Comments: sacral wound Restrictions Weight Bearing Restrictions Per Provider Order: No     Mobility  Bed Mobility Overal bed mobility: Needs Assistance Bed Mobility: Supine to Sit, Sit to Supine     Supine to sit: Contact guard Sit to supine: Min assist   General bed  mobility comments: CGA to get trunk to mid line and MIn A for LE to EOB    Transfers Overall transfer level: Needs assistance Equipment used: Rolling walker (2 wheels) Transfers: Sit to/from Stand Sit to Stand: Min assist, Contact guard assist           General transfer comment: Min A initially then CGA for second attempt at standing. Verbal cues for safe hand placement and stabilization of RW.    Ambulation/Gait     Pre-gait activities: Pt took side stes at EOB ~ 2 ft with Min A with verbal cues for stepping/sequencing with AD. Pt marched in placed wtih long holds in SLS with heavy UE support on RW.       Balance Overall balance assessment: Needs assistance Sitting-balance support: No upper extremity supported, Feet supported Sitting balance-Leahy Scale: Fair     Standing balance support: Bilateral upper extremity supported, Reliant on assistive device for balance Standing balance-Leahy Scale: Poor Standing balance comment: reliant on UE support and external assist     Communication Communication Communication: Impaired Factors Affecting Communication: Hearing impaired  Cognition Arousal: Alert Behavior During Therapy: Flat affect   PT - Cognitive impairments: History of cognitive impairments     Following commands: Impaired Following commands impaired: Follows one step commands with increased time    Cueing Cueing Techniques: Verbal cues         Pertinent Vitals/Pain Pain Assessment Pain Assessment: No/denies pain     PT Goals (current goals can now be found in the care plan section) Acute Rehab PT Goals Patient Stated Goal: none stated PT Goal Formulation: With patient Time For Goal Achievement: 01/18/25 Potential to Achieve  Goals: Fair Progress towards PT goals: Progressing toward goals    Frequency    Min 1X/week      PT Plan  Continue with current POC        AM-PAC PT 6 Clicks Mobility   Outcome Measure  Help needed turning from  your back to your side while in a flat bed without using bedrails?: A Little Help needed moving from lying on your back to sitting on the side of a flat bed without using bedrails?: A Little Help needed moving to and from a bed to a chair (including a wheelchair)?: A Little Help needed standing up from a chair using your arms (e.g., wheelchair or bedside chair)?: A Little Help needed to walk in hospital room?: Total Help needed climbing 3-5 steps with a railing? : Total 6 Click Score: 14    End of Session Equipment Utilized During Treatment: Gait belt Activity Tolerance: Patient tolerated treatment well Patient left: with call bell/phone within reach;in bed;with bed alarm set Nurse Communication: Mobility status PT Visit Diagnosis: Other abnormalities of gait and mobility (R26.89);Muscle weakness (generalized) (M62.81)     Time: 8677-8663 PT Time Calculation (min) (ACUTE ONLY): 14 min  Charges:    $Therapeutic Activity: 8-22 mins PT General Charges $$ ACUTE PT VISIT: 1 Visit                     Dorothyann Maier, DPT, CLT  Acute Rehabilitation Services Office: [phone] (Secure chat preferred)    Dorothyann VEAR Maier 01/12/2025, 1:44 PM

## 2025-01-12 NOTE — Progress Notes (Signed)
" ° °  Palliative Medicine Inpatient Follow Up Note HPI: 85 y.o. male  with past medical history of advanced dementia, type 2 diabetes mellitus, CKD 3a with baseline creatinine 1.3-1.7, complete heart block, and prostate cancer admitted on 01/02/2025 with refractory hypoglycemia. Patient has had four inpatient hospitalizations over the last six months. Patient is familiar to palliative care team. Patient faces treatment option decisions, advanced directive decisions, and anticipatory care needs. PMT consulted for goals of care.   Today's Discussion 01/12/2025  I reviewed the chart notes including nursing notes from Andre Stout, progress notes Dr. Von, and Andre Stout - MSW. I also reviewed vital signs, nursing flowsheets, medication administrations record, labs - Glucose remains variable last capillary blood sugar was 285.  I met with Andre Stout at bedside this afternoon. He is resting comfortably in NAD. He denies pain, shortness of breath, or nausea this late morning.  We reviewed the plan for Andre Stout to transition to [facility].   Questions and concerns addressed/Palliative Support Provided.   Objective Assessment: Vital Signs Vitals:   01/12/25 0430 01/12/25 0750  BP: (!) 144/63 125/78  Pulse: 63 63  Resp:  18  Temp: 98.1 F (36.7 C) 98.3 F (36.8 C)  SpO2: 100% 99%    Intake/Output Summary (Last 24 hours) at 01/12/2025 1101 Last data filed at 01/12/2025 0028 Gross per 24 hour  Intake --  Output 800 ml  Net -800 ml   Last Weight  Most recent update: 01/10/2025  5:17 AM    Weight  61.6 kg (135 lb 12.9 oz)            Gen:  Frail elderly Caucasian M chronically ill appearing HEENT: moist mucous membranes CV: Regular rate and rhythm  PULM:  On RA, breathing is even and nonlabored ABD: soft/nontender  EXT: No edema  Neuro: Alert and oriented x2  SUMMARY OF RECOMMENDATIONS   DNAR/DNI   Continue current care --> Allowing time for outcomes  TOC working on  placement  OP Palliative care recommended, has not been set up due to recurrent readmissions   Plan to transitions to [place] [facility] ______________________________________________________________________________________ Andre Stout Andre Stout Palliative Medicine Team Team Cell Phone: [phone] Please utilize secure chat with additional questions, if there is no response within 30 minutes please call the above phone number  I personally spent a total of 25 minutes in the care of the patient today including preparing to see the patient, counseling and educating, independently interpreting results, communicating results, and providing emotional support.      "

## 2025-01-12 NOTE — Plan of Care (Signed)

## 2025-01-12 NOTE — Progress Notes (Signed)
 RN called [facility] and gave report to Frost and she stated understanding. IV has been removed.
